# Patient Record
Sex: Female | Born: 1950 | Race: Black or African American | Hispanic: No | Marital: Married | Smoking: Never smoker
Health system: Southern US, Community
[De-identification: ages and names within clinical notes are randomized; demographics above are authoritative.]

## PROBLEM LIST (undated history)

## (undated) DIAGNOSIS — Z9889 Other specified postprocedural states: Secondary | ICD-10-CM

## (undated) DIAGNOSIS — M2550 Pain in unspecified joint: Secondary | ICD-10-CM

## (undated) DIAGNOSIS — N200 Calculus of kidney: Secondary | ICD-10-CM

## (undated) DIAGNOSIS — I10 Essential (primary) hypertension: Secondary | ICD-10-CM

## (undated) DIAGNOSIS — J189 Pneumonia, unspecified organism: Secondary | ICD-10-CM

## (undated) DIAGNOSIS — R112 Nausea with vomiting, unspecified: Secondary | ICD-10-CM

## (undated) DIAGNOSIS — M1990 Unspecified osteoarthritis, unspecified site: Secondary | ICD-10-CM

## (undated) DIAGNOSIS — E785 Hyperlipidemia, unspecified: Secondary | ICD-10-CM

## (undated) DIAGNOSIS — K760 Fatty (change of) liver, not elsewhere classified: Secondary | ICD-10-CM

## (undated) DIAGNOSIS — K219 Gastro-esophageal reflux disease without esophagitis: Secondary | ICD-10-CM

## (undated) DIAGNOSIS — Z8659 Personal history of other mental and behavioral disorders: Secondary | ICD-10-CM

## (undated) DIAGNOSIS — M549 Dorsalgia, unspecified: Secondary | ICD-10-CM

## (undated) DIAGNOSIS — E1140 Type 2 diabetes mellitus with diabetic neuropathy, unspecified: Secondary | ICD-10-CM

## (undated) DIAGNOSIS — K3184 Gastroparesis: Secondary | ICD-10-CM

## (undated) DIAGNOSIS — G909 Disorder of the autonomic nervous system, unspecified: Secondary | ICD-10-CM

## (undated) DIAGNOSIS — F32A Depression, unspecified: Secondary | ICD-10-CM

## (undated) DIAGNOSIS — F419 Anxiety disorder, unspecified: Secondary | ICD-10-CM

## (undated) DIAGNOSIS — Z91018 Allergy to other foods: Secondary | ICD-10-CM

## (undated) DIAGNOSIS — K900 Celiac disease: Secondary | ICD-10-CM

## (undated) DIAGNOSIS — H269 Unspecified cataract: Secondary | ICD-10-CM

## (undated) DIAGNOSIS — K589 Irritable bowel syndrome without diarrhea: Secondary | ICD-10-CM

## (undated) DIAGNOSIS — K5790 Diverticulosis of intestine, part unspecified, without perforation or abscess without bleeding: Secondary | ICD-10-CM

## (undated) DIAGNOSIS — F329 Major depressive disorder, single episode, unspecified: Secondary | ICD-10-CM

## (undated) DIAGNOSIS — K5900 Constipation, unspecified: Secondary | ICD-10-CM

## (undated) DIAGNOSIS — R0602 Shortness of breath: Secondary | ICD-10-CM

## (undated) DIAGNOSIS — Z86010 Personal history of colonic polyps: Secondary | ICD-10-CM

## (undated) HISTORY — DX: Gastroparesis: K31.84

## (undated) HISTORY — DX: Celiac disease: K90.0

## (undated) HISTORY — DX: Personal history of colonic polyps: Z86.010

## (undated) HISTORY — DX: Unspecified osteoarthritis, unspecified site: M19.90

## (undated) HISTORY — DX: Fatty (change of) liver, not elsewhere classified: K76.0

## (undated) HISTORY — DX: Diverticulosis of intestine, part unspecified, without perforation or abscess without bleeding: K57.90

## (undated) HISTORY — DX: Disorder of the autonomic nervous system, unspecified: G90.9

## (undated) HISTORY — DX: Irritable bowel syndrome, unspecified: K58.9

## (undated) HISTORY — DX: Essential (primary) hypertension: I10

## (undated) HISTORY — DX: Unspecified cataract: H26.9

## (undated) HISTORY — DX: Calculus of kidney: N20.0

## (undated) HISTORY — DX: Constipation, unspecified: K59.00

## (undated) HISTORY — PX: COLONOSCOPY: SHX174

## (undated) HISTORY — DX: Hyperlipidemia, unspecified: E78.5

## (undated) HISTORY — PX: TUBAL LIGATION: SHX77

## (undated) HISTORY — PX: APPENDECTOMY: SHX54

---
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
## 1998-05-01 HISTORY — PX: OTHER SURGICAL HISTORY: SHX169

## 1998-08-09 ENCOUNTER — Other Ambulatory Visit: Admission: RE | Admit: 1998-08-09 | Discharge: 1998-08-09 | Payer: Self-pay | Admitting: Obstetrics and Gynecology

## 1999-08-06 ENCOUNTER — Emergency Department (HOSPITAL_COMMUNITY): Admission: EM | Admit: 1999-08-06 | Discharge: 1999-08-06 | Payer: Self-pay | Admitting: Emergency Medicine

## 1999-09-16 ENCOUNTER — Other Ambulatory Visit: Admission: RE | Admit: 1999-09-16 | Discharge: 1999-09-16 | Payer: Self-pay | Admitting: Obstetrics and Gynecology

## 1999-10-24 ENCOUNTER — Other Ambulatory Visit: Admission: RE | Admit: 1999-10-24 | Discharge: 1999-10-24 | Payer: Self-pay | Admitting: Gastroenterology

## 2000-03-16 ENCOUNTER — Ambulatory Visit (HOSPITAL_BASED_OUTPATIENT_CLINIC_OR_DEPARTMENT_OTHER): Admission: RE | Admit: 2000-03-16 | Discharge: 2000-03-16 | Payer: Self-pay | Admitting: Pulmonary Disease

## 2000-10-01 ENCOUNTER — Other Ambulatory Visit: Admission: RE | Admit: 2000-10-01 | Discharge: 2000-10-01 | Payer: Self-pay | Admitting: Obstetrics and Gynecology

## 2001-11-25 ENCOUNTER — Other Ambulatory Visit: Admission: RE | Admit: 2001-11-25 | Discharge: 2001-11-25 | Payer: Self-pay | Admitting: Obstetrics and Gynecology

## 2001-12-13 ENCOUNTER — Encounter: Payer: Self-pay | Admitting: Gastroenterology

## 2001-12-13 ENCOUNTER — Ambulatory Visit (HOSPITAL_COMMUNITY): Admission: RE | Admit: 2001-12-13 | Discharge: 2001-12-13 | Payer: Self-pay | Admitting: Gastroenterology

## 2002-06-05 ENCOUNTER — Ambulatory Visit (HOSPITAL_COMMUNITY): Admission: RE | Admit: 2002-06-05 | Discharge: 2002-06-05 | Payer: Self-pay | Admitting: Internal Medicine

## 2002-06-05 ENCOUNTER — Encounter: Payer: Self-pay | Admitting: Internal Medicine

## 2003-01-19 ENCOUNTER — Other Ambulatory Visit: Admission: RE | Admit: 2003-01-19 | Discharge: 2003-01-19 | Payer: Self-pay | Admitting: Obstetrics and Gynecology

## 2003-12-26 ENCOUNTER — Emergency Department (HOSPITAL_COMMUNITY): Admission: EM | Admit: 2003-12-26 | Discharge: 2003-12-26 | Payer: Self-pay | Admitting: Emergency Medicine

## 2004-01-27 ENCOUNTER — Other Ambulatory Visit: Admission: RE | Admit: 2004-01-27 | Discharge: 2004-01-27 | Payer: Self-pay | Admitting: Obstetrics and Gynecology

## 2004-02-04 ENCOUNTER — Encounter: Admission: RE | Admit: 2004-02-04 | Discharge: 2004-05-04 | Payer: Self-pay | Admitting: Internal Medicine

## 2004-04-21 ENCOUNTER — Ambulatory Visit: Payer: Self-pay | Admitting: Internal Medicine

## 2004-05-10 ENCOUNTER — Ambulatory Visit: Payer: Self-pay | Admitting: Internal Medicine

## 2004-06-06 ENCOUNTER — Ambulatory Visit: Payer: Self-pay | Admitting: Internal Medicine

## 2004-06-10 ENCOUNTER — Ambulatory Visit: Payer: Self-pay | Admitting: Internal Medicine

## 2004-09-06 ENCOUNTER — Ambulatory Visit: Payer: Self-pay | Admitting: Internal Medicine

## 2004-09-09 ENCOUNTER — Ambulatory Visit: Payer: Self-pay | Admitting: Internal Medicine

## 2004-11-21 ENCOUNTER — Ambulatory Visit: Payer: Self-pay | Admitting: Gastroenterology

## 2004-11-30 ENCOUNTER — Ambulatory Visit: Payer: Self-pay | Admitting: Gastroenterology

## 2004-12-02 ENCOUNTER — Ambulatory Visit: Payer: Self-pay | Admitting: Internal Medicine

## 2004-12-30 ENCOUNTER — Emergency Department (HOSPITAL_COMMUNITY): Admission: EM | Admit: 2004-12-30 | Discharge: 2004-12-30 | Payer: Self-pay | Admitting: Emergency Medicine

## 2005-04-03 ENCOUNTER — Ambulatory Visit: Payer: Self-pay | Admitting: Internal Medicine

## 2005-04-07 ENCOUNTER — Encounter: Payer: Self-pay | Admitting: Internal Medicine

## 2005-04-07 ENCOUNTER — Ambulatory Visit: Payer: Self-pay

## 2005-04-14 ENCOUNTER — Ambulatory Visit: Payer: Self-pay

## 2005-04-25 ENCOUNTER — Ambulatory Visit: Payer: Self-pay | Admitting: Internal Medicine

## 2005-05-10 ENCOUNTER — Ambulatory Visit: Payer: Self-pay | Admitting: Internal Medicine

## 2005-06-29 ENCOUNTER — Ambulatory Visit: Payer: Self-pay | Admitting: Internal Medicine

## 2005-10-19 ENCOUNTER — Ambulatory Visit: Payer: Self-pay | Admitting: Internal Medicine

## 2005-10-20 ENCOUNTER — Ambulatory Visit (HOSPITAL_COMMUNITY): Admission: RE | Admit: 2005-10-20 | Discharge: 2005-10-20 | Payer: Self-pay | Admitting: Internal Medicine

## 2005-11-21 ENCOUNTER — Ambulatory Visit: Payer: Self-pay | Admitting: Internal Medicine

## 2005-12-21 ENCOUNTER — Ambulatory Visit: Payer: Self-pay | Admitting: Internal Medicine

## 2006-01-02 ENCOUNTER — Ambulatory Visit: Payer: Self-pay | Admitting: Endocrinology

## 2006-01-25 ENCOUNTER — Ambulatory Visit: Payer: Self-pay | Admitting: Endocrinology

## 2006-02-15 ENCOUNTER — Ambulatory Visit: Payer: Self-pay | Admitting: Endocrinology

## 2006-03-27 ENCOUNTER — Ambulatory Visit: Payer: Self-pay | Admitting: Internal Medicine

## 2006-04-25 ENCOUNTER — Ambulatory Visit: Payer: Self-pay | Admitting: Endocrinology

## 2006-05-12 ENCOUNTER — Encounter (INDEPENDENT_AMBULATORY_CARE_PROVIDER_SITE_OTHER): Payer: Self-pay | Admitting: *Deleted

## 2006-05-12 ENCOUNTER — Ambulatory Visit: Payer: Self-pay | Admitting: Family Medicine

## 2006-06-14 ENCOUNTER — Emergency Department (HOSPITAL_COMMUNITY): Admission: EM | Admit: 2006-06-14 | Discharge: 2006-06-15 | Payer: Self-pay | Admitting: Emergency Medicine

## 2006-06-19 ENCOUNTER — Ambulatory Visit: Payer: Self-pay | Admitting: Endocrinology

## 2006-07-25 ENCOUNTER — Ambulatory Visit: Payer: Self-pay | Admitting: Endocrinology

## 2006-08-01 ENCOUNTER — Ambulatory Visit: Payer: Self-pay

## 2006-08-28 ENCOUNTER — Ambulatory Visit: Payer: Self-pay | Admitting: Endocrinology

## 2006-08-28 LAB — CONVERTED CEMR LAB: Hgb A1c MFr Bld: 8.5 % — ABNORMAL HIGH (ref 4.6–6.0)

## 2006-08-31 ENCOUNTER — Ambulatory Visit: Payer: Self-pay | Admitting: Endocrinology

## 2006-09-10 ENCOUNTER — Ambulatory Visit: Payer: Self-pay | Admitting: Endocrinology

## 2006-10-22 ENCOUNTER — Encounter: Payer: Self-pay | Admitting: Endocrinology

## 2006-10-22 DIAGNOSIS — J45909 Unspecified asthma, uncomplicated: Secondary | ICD-10-CM | POA: Insufficient documentation

## 2006-10-22 DIAGNOSIS — K649 Unspecified hemorrhoids: Secondary | ICD-10-CM | POA: Insufficient documentation

## 2006-10-22 DIAGNOSIS — D509 Iron deficiency anemia, unspecified: Secondary | ICD-10-CM | POA: Insufficient documentation

## 2006-10-22 DIAGNOSIS — I10 Essential (primary) hypertension: Secondary | ICD-10-CM | POA: Insufficient documentation

## 2006-10-22 DIAGNOSIS — E1143 Type 2 diabetes mellitus with diabetic autonomic (poly)neuropathy: Secondary | ICD-10-CM | POA: Insufficient documentation

## 2006-10-22 DIAGNOSIS — I152 Hypertension secondary to endocrine disorders: Secondary | ICD-10-CM | POA: Insufficient documentation

## 2006-11-23 ENCOUNTER — Encounter: Payer: Self-pay | Admitting: Endocrinology

## 2006-11-23 ENCOUNTER — Ambulatory Visit: Payer: Self-pay | Admitting: Endocrinology

## 2006-11-23 ENCOUNTER — Other Ambulatory Visit: Admission: RE | Admit: 2006-11-23 | Discharge: 2006-11-23 | Payer: Self-pay | Admitting: Endocrinology

## 2006-11-23 LAB — CONVERTED CEMR LAB
ALT: 17 units/L (ref 0–35)
AST: 15 units/L (ref 0–37)
Albumin: 3.6 g/dL (ref 3.5–5.2)
Alkaline Phosphatase: 79 units/L (ref 39–117)
Bilirubin, Direct: 0.1 mg/dL (ref 0.0–0.3)
Cholesterol: 205 mg/dL (ref 0–200)
Direct LDL: 143.2 mg/dL
HDL: 40 mg/dL (ref >39.0)
Hgb A1c MFr Bld: 8.1 % — ABNORMAL HIGH (ref 4.6–6.0)
TSH: 1.24 microintl units/mL (ref 0.35–5.50)
Testosterone: 24.96 ng/dL (ref 10.00–70.0)
Total Bilirubin: 0.9 mg/dL (ref 0.3–1.2)
Total CHOL/HDL Ratio: 5.1
Total Protein: 7.1 g/dL (ref 6.0–8.3)
Triglycerides: 67 mg/dL (ref 0–149)
VLDL: 13 mg/dL (ref 0–40)

## 2006-11-26 ENCOUNTER — Encounter: Admission: RE | Admit: 2006-11-26 | Discharge: 2006-11-26 | Payer: Self-pay | Admitting: Endocrinology

## 2006-12-03 ENCOUNTER — Ambulatory Visit: Payer: Self-pay | Admitting: Endocrinology

## 2006-12-03 LAB — CONVERTED CEMR LAB
BUN: 14 mg/dL (ref 6–23)
Creatinine, Ser: 0.8 mg/dL (ref 0.4–1.2)

## 2006-12-07 ENCOUNTER — Encounter: Admission: RE | Admit: 2006-12-07 | Discharge: 2006-12-07 | Payer: Self-pay | Admitting: Endocrinology

## 2007-02-05 ENCOUNTER — Encounter: Payer: Self-pay | Admitting: Endocrinology

## 2007-02-14 ENCOUNTER — Ambulatory Visit: Payer: Self-pay | Admitting: Gastroenterology

## 2007-02-27 ENCOUNTER — Ambulatory Visit: Payer: Self-pay | Admitting: Gastroenterology

## 2007-02-27 DIAGNOSIS — E1143 Type 2 diabetes mellitus with diabetic autonomic (poly)neuropathy: Secondary | ICD-10-CM | POA: Insufficient documentation

## 2007-02-27 DIAGNOSIS — K3184 Gastroparesis: Secondary | ICD-10-CM

## 2007-02-27 DIAGNOSIS — K2990 Gastroduodenitis, unspecified, without bleeding: Secondary | ICD-10-CM

## 2007-02-27 DIAGNOSIS — K2970 Gastritis, unspecified, without bleeding: Secondary | ICD-10-CM | POA: Insufficient documentation

## 2007-02-27 DIAGNOSIS — T189XXA Foreign body of alimentary tract, part unspecified, initial encounter: Secondary | ICD-10-CM | POA: Insufficient documentation

## 2007-03-02 ENCOUNTER — Encounter: Payer: Self-pay | Admitting: *Deleted

## 2007-05-13 ENCOUNTER — Encounter: Payer: Self-pay | Admitting: Endocrinology

## 2007-06-21 ENCOUNTER — Ambulatory Visit: Payer: Self-pay | Admitting: Endocrinology

## 2007-06-21 DIAGNOSIS — J309 Allergic rhinitis, unspecified: Secondary | ICD-10-CM | POA: Insufficient documentation

## 2007-06-21 DIAGNOSIS — M79609 Pain in unspecified limb: Secondary | ICD-10-CM | POA: Insufficient documentation

## 2007-06-21 LAB — CONVERTED CEMR LAB: Hgb A1c MFr Bld: 8 % — ABNORMAL HIGH (ref 4.6–6.0)

## 2007-09-26 ENCOUNTER — Ambulatory Visit: Payer: Self-pay | Admitting: Endocrinology

## 2007-09-26 DIAGNOSIS — R220 Localized swelling, mass and lump, head: Secondary | ICD-10-CM | POA: Insufficient documentation

## 2007-09-26 DIAGNOSIS — R252 Cramp and spasm: Secondary | ICD-10-CM | POA: Insufficient documentation

## 2007-09-26 DIAGNOSIS — R221 Localized swelling, mass and lump, neck: Secondary | ICD-10-CM

## 2007-09-26 LAB — CONVERTED CEMR LAB: Blood Glucose, Fingerstick: 81

## 2007-10-03 ENCOUNTER — Encounter: Admission: RE | Admit: 2007-10-03 | Discharge: 2007-10-03 | Payer: Self-pay | Admitting: Endocrinology

## 2007-11-19 ENCOUNTER — Encounter: Payer: Self-pay | Admitting: Endocrinology

## 2007-12-26 ENCOUNTER — Ambulatory Visit: Payer: Self-pay | Admitting: Endocrinology

## 2007-12-26 DIAGNOSIS — L989 Disorder of the skin and subcutaneous tissue, unspecified: Secondary | ICD-10-CM | POA: Insufficient documentation

## 2007-12-26 LAB — CONVERTED CEMR LAB: Hgb A1c MFr Bld: 7.9 % — ABNORMAL HIGH (ref 4.6–6.0)

## 2008-01-02 ENCOUNTER — Encounter: Payer: Self-pay | Admitting: Endocrinology

## 2008-01-23 ENCOUNTER — Telehealth: Payer: Self-pay | Admitting: Endocrinology

## 2008-03-31 LAB — HM MAMMOGRAPHY: HM Mammogram: NORMAL

## 2008-04-01 ENCOUNTER — Ambulatory Visit: Payer: Self-pay | Admitting: Endocrinology

## 2008-04-01 DIAGNOSIS — Z780 Asymptomatic menopausal state: Secondary | ICD-10-CM | POA: Insufficient documentation

## 2008-04-01 DIAGNOSIS — D72819 Decreased white blood cell count, unspecified: Secondary | ICD-10-CM | POA: Insufficient documentation

## 2008-04-01 LAB — CONVERTED CEMR LAB
ALT: 18 units/L (ref 0–35)
AST: 21 units/L (ref 0–37)
Albumin: 3.6 g/dL (ref 3.5–5.2)
Alkaline Phosphatase: 60 units/L (ref 39–117)
BUN: 18 mg/dL (ref 6–23)
Basophils Absolute: 0 10*3/uL (ref 0.0–0.1)
Basophils Relative: 0.6 % (ref 0.0–3.0)
Bilirubin Urine: NEGATIVE
Bilirubin, Direct: 0.1 mg/dL (ref 0.0–0.3)
CO2: 29 meq/L (ref 19–32)
Calcium: 9.2 mg/dL (ref 8.4–10.5)
Chloride: 107 meq/L (ref 96–112)
Cholesterol: 193 mg/dL (ref 0–200)
Creatinine, Ser: 0.9 mg/dL (ref 0.4–1.2)
Creatinine,U: 189.1 mg/dL
Crystals: NEGATIVE
Eosinophils Absolute: 0.1 10*3/uL (ref 0.0–0.7)
Eosinophils Relative: 2.4 % (ref 0.0–5.0)
GFR calc Af Amer: 83 mL/min
GFR calc non Af Amer: 69 mL/min
Glucose, Bld: 179 mg/dL — ABNORMAL HIGH (ref 70–99)
HCT: 42.3 % (ref 36.0–46.0)
HDL: 44.6 mg/dL (ref >39.0)
Hemoglobin, Urine: NEGATIVE
Hemoglobin: 14.5 g/dL (ref 12.0–15.0)
Hgb A1c MFr Bld: 8 % — ABNORMAL HIGH (ref 4.6–6.0)
Iron: 97 ug/dL (ref 42–145)
LDL Cholesterol: 141 mg/dL — ABNORMAL HIGH (ref 0–99)
Leukocytes, UA: NEGATIVE
Lymphocytes Relative: 45.3 % (ref 12.0–46.0)
MCHC: 34.3 g/dL (ref 30.0–36.0)
MCV: 95.3 fL (ref 78.0–100.0)
Microalb Creat Ratio: 8.5 mg/g (ref 0.0–30.0)
Microalb, Ur: 1.6 mg/dL (ref 0.0–1.9)
Monocytes Absolute: 0.3 10*3/uL (ref 0.1–1.0)
Monocytes Relative: 7.4 % (ref 3.0–12.0)
Neutro Abs: 1.5 10*3/uL (ref 1.4–7.7)
Neutrophils Relative %: 44.3 % (ref 43.0–77.0)
Nitrite: NEGATIVE
Platelets: 234 10*3/uL (ref 150–400)
Potassium: 4.7 meq/L (ref 3.5–5.1)
RBC / HPF: NONE SEEN
RBC: 4.44 M/uL (ref 3.87–5.11)
RDW: 12.3 % (ref 11.5–14.6)
Saturation Ratios: 27.2 % (ref 20.0–50.0)
Sodium: 140 meq/L (ref 135–145)
Specific Gravity, Urine: >=1.03 (ref 1.000–1.03)
TSH: 1.1 microintl units/mL (ref 0.35–5.50)
Total Bilirubin: 0.8 mg/dL (ref 0.3–1.2)
Total CHOL/HDL Ratio: 4.3
Total Protein, Urine: NEGATIVE mg/dL
Total Protein: 6.9 g/dL (ref 6.0–8.3)
Transferrin: 255.1 mg/dL (ref >=212.0)
Triglycerides: 36 mg/dL (ref 0–149)
Urine Glucose: 250 mg/dL — AB
Urobilinogen, UA: 0.2 (ref 0.0–1.0)
VLDL: 7 mg/dL (ref 0–40)
WBC: 3.4 10*3/uL — ABNORMAL LOW (ref 4.5–10.5)
pH: 5.5 (ref 5.0–8.0)

## 2008-04-07 ENCOUNTER — Telehealth (INDEPENDENT_AMBULATORY_CARE_PROVIDER_SITE_OTHER): Payer: Self-pay | Admitting: *Deleted

## 2008-04-08 ENCOUNTER — Telehealth (INDEPENDENT_AMBULATORY_CARE_PROVIDER_SITE_OTHER): Payer: Self-pay | Admitting: *Deleted

## 2008-04-09 ENCOUNTER — Ambulatory Visit: Payer: Self-pay | Admitting: Internal Medicine

## 2008-04-09 ENCOUNTER — Encounter: Payer: Self-pay | Admitting: Endocrinology

## 2008-05-04 ENCOUNTER — Telehealth (INDEPENDENT_AMBULATORY_CARE_PROVIDER_SITE_OTHER): Payer: Self-pay | Admitting: *Deleted

## 2008-10-23 ENCOUNTER — Ambulatory Visit: Payer: Self-pay | Admitting: Endocrinology

## 2008-10-23 LAB — CONVERTED CEMR LAB: Hgb A1c MFr Bld: 8.4 % — ABNORMAL HIGH (ref 4.6–6.5)

## 2008-11-05 ENCOUNTER — Ambulatory Visit: Payer: Self-pay | Admitting: Gastroenterology

## 2008-11-12 ENCOUNTER — Ambulatory Visit: Payer: Self-pay | Admitting: Gastroenterology

## 2008-11-12 LAB — CONVERTED CEMR LAB: Fecal Occult Bld: NEGATIVE

## 2008-11-13 ENCOUNTER — Encounter: Payer: Self-pay | Admitting: Endocrinology

## 2009-01-14 ENCOUNTER — Ambulatory Visit: Payer: Self-pay | Admitting: Endocrinology

## 2009-01-14 DIAGNOSIS — R109 Unspecified abdominal pain: Secondary | ICD-10-CM | POA: Insufficient documentation

## 2009-01-14 DIAGNOSIS — R079 Chest pain, unspecified: Secondary | ICD-10-CM | POA: Insufficient documentation

## 2009-01-14 LAB — CONVERTED CEMR LAB
Creatinine,U: 250.2 mg/dL
Hgb A1c MFr Bld: 8.7 % — ABNORMAL HIGH (ref 4.6–6.5)
Microalb Creat Ratio: 3.6 mg/g (ref 0.0–30.0)
Microalb, Ur: 0.9 mg/dL (ref 0.0–1.9)

## 2009-01-15 ENCOUNTER — Telehealth: Payer: Self-pay | Admitting: Endocrinology

## 2009-01-15 LAB — CONVERTED CEMR LAB
ALT: 20 units/L (ref 0–35)
AST: 21 units/L (ref 0–37)
Albumin: 3.6 g/dL (ref 3.5–5.2)
Alkaline Phosphatase: 69 units/L (ref 39–117)
Amylase: 78 units/L (ref 27–131)
BUN: 19 mg/dL (ref 6–23)
Basophils Absolute: 0 10*3/uL (ref 0.0–0.1)
Basophils Relative: 0.9 % (ref 0.0–3.0)
Bilirubin, Direct: 0 mg/dL (ref 0.0–0.3)
CO2: 29 meq/L (ref 19–32)
Calcium: 8.9 mg/dL (ref 8.4–10.5)
Chloride: 104 meq/L (ref 96–112)
Creatinine, Ser: 0.9 mg/dL (ref 0.4–1.2)
Eosinophils Absolute: 0.1 10*3/uL (ref 0.0–0.7)
Eosinophils Relative: 3 % (ref 0.0–5.0)
GFR calc non Af Amer: 82.62 mL/min (ref >60)
Glucose, Bld: 212 mg/dL — ABNORMAL HIGH (ref 70–99)
HCT: 41.1 % (ref 36.0–46.0)
Hemoglobin: 14 g/dL (ref 12.0–15.0)
Lymphocytes Relative: 48.1 % — ABNORMAL HIGH (ref 12.0–46.0)
Lymphs Abs: 1.7 10*3/uL (ref 0.7–4.0)
MCHC: 34.1 g/dL (ref 30.0–36.0)
MCV: 95.2 fL (ref 78.0–100.0)
Monocytes Absolute: 0.3 10*3/uL (ref 0.1–1.0)
Monocytes Relative: 8 % (ref 3.0–12.0)
Neutro Abs: 1.4 10*3/uL (ref 1.4–7.7)
Neutrophils Relative %: 40 % — ABNORMAL LOW (ref 43.0–77.0)
Platelets: 226 10*3/uL (ref 150.0–400.0)
Potassium: 4 meq/L (ref 3.5–5.1)
RBC: 4.31 M/uL (ref 3.87–5.11)
RDW: 12.6 % (ref 11.5–14.6)
Sodium: 139 meq/L (ref 135–145)
Total Bilirubin: 0.8 mg/dL (ref 0.3–1.2)
Total Protein: 7.2 g/dL (ref 6.0–8.3)
WBC: 3.5 10*3/uL — ABNORMAL LOW (ref 4.5–10.5)

## 2009-01-22 ENCOUNTER — Encounter: Admission: RE | Admit: 2009-01-22 | Discharge: 2009-01-22 | Payer: Self-pay | Admitting: Endocrinology

## 2009-01-28 ENCOUNTER — Telehealth: Payer: Self-pay | Admitting: Endocrinology

## 2009-01-29 ENCOUNTER — Telehealth: Payer: Self-pay | Admitting: Endocrinology

## 2009-01-29 ENCOUNTER — Ambulatory Visit: Payer: Self-pay | Admitting: Endocrinology

## 2009-01-29 DIAGNOSIS — E7800 Pure hypercholesterolemia, unspecified: Secondary | ICD-10-CM

## 2009-01-29 DIAGNOSIS — E1169 Type 2 diabetes mellitus with other specified complication: Secondary | ICD-10-CM | POA: Insufficient documentation

## 2009-04-01 ENCOUNTER — Ambulatory Visit: Payer: Self-pay | Admitting: Endocrinology

## 2009-04-01 DIAGNOSIS — H60339 Swimmer's ear, unspecified ear: Secondary | ICD-10-CM | POA: Insufficient documentation

## 2009-04-01 LAB — CONVERTED CEMR LAB
Hgb A1c MFr Bld: 8.2 % — ABNORMAL HIGH (ref 4.6–6.5)
Vit D, 25-Hydroxy: 26 ng/mL — ABNORMAL LOW (ref 30–89)

## 2009-07-01 ENCOUNTER — Ambulatory Visit: Payer: Self-pay | Admitting: Endocrinology

## 2009-07-01 LAB — CONVERTED CEMR LAB: Hgb A1c MFr Bld: 8.4 % — ABNORMAL HIGH (ref 4.6–6.5)

## 2009-09-02 ENCOUNTER — Telehealth: Payer: Self-pay | Admitting: Endocrinology

## 2009-10-07 ENCOUNTER — Ambulatory Visit: Payer: Self-pay | Admitting: Endocrinology

## 2009-10-07 DIAGNOSIS — R209 Unspecified disturbances of skin sensation: Secondary | ICD-10-CM | POA: Insufficient documentation

## 2009-10-07 LAB — CONVERTED CEMR LAB
Folate: 12.5 ng/mL
Hgb A1c MFr Bld: 8 % — ABNORMAL HIGH (ref 4.6–6.5)
Vitamin B-12: 305 pg/mL (ref 211–911)

## 2009-10-11 ENCOUNTER — Telehealth: Payer: Self-pay | Admitting: Endocrinology

## 2009-10-27 ENCOUNTER — Telehealth: Payer: Self-pay | Admitting: Internal Medicine

## 2009-11-19 ENCOUNTER — Encounter (INDEPENDENT_AMBULATORY_CARE_PROVIDER_SITE_OTHER): Payer: Self-pay | Admitting: *Deleted

## 2009-11-25 ENCOUNTER — Encounter: Payer: Self-pay | Admitting: Endocrinology

## 2009-12-20 ENCOUNTER — Ambulatory Visit: Payer: Self-pay

## 2009-12-20 ENCOUNTER — Ambulatory Visit: Payer: Self-pay | Admitting: Endocrinology

## 2009-12-20 DIAGNOSIS — R609 Edema, unspecified: Secondary | ICD-10-CM | POA: Insufficient documentation

## 2009-12-20 DIAGNOSIS — S43006A Unspecified dislocation of unspecified shoulder joint, initial encounter: Secondary | ICD-10-CM | POA: Insufficient documentation

## 2010-01-07 ENCOUNTER — Encounter (INDEPENDENT_AMBULATORY_CARE_PROVIDER_SITE_OTHER): Payer: Self-pay | Admitting: *Deleted

## 2010-01-11 ENCOUNTER — Ambulatory Visit: Payer: Self-pay | Admitting: Gastroenterology

## 2010-03-01 DIAGNOSIS — Z860100 Personal history of colon polyps, unspecified: Secondary | ICD-10-CM

## 2010-03-01 HISTORY — DX: Personal history of colon polyps, unspecified: Z86.0100

## 2010-03-01 HISTORY — DX: Personal history of colonic polyps: Z86.010

## 2010-03-07 ENCOUNTER — Ambulatory Visit: Payer: Self-pay | Admitting: Gastroenterology

## 2010-03-07 LAB — HM COLONOSCOPY

## 2010-03-08 ENCOUNTER — Encounter: Payer: Self-pay | Admitting: Gastroenterology

## 2010-05-01 LAB — HM MAMMOGRAPHY: HM Mammogram: NORMAL

## 2010-05-26 ENCOUNTER — Other Ambulatory Visit: Payer: Self-pay | Admitting: Obstetrics and Gynecology

## 2010-05-26 LAB — HM PAP SMEAR: HM Pap smear: NORMAL

## 2010-05-28 ENCOUNTER — Ambulatory Visit
Admission: RE | Admit: 2010-05-28 | Discharge: 2010-05-28 | Payer: Self-pay | Source: Home / Self Care | Attending: Family Medicine | Admitting: Family Medicine

## 2010-05-28 DIAGNOSIS — J019 Acute sinusitis, unspecified: Secondary | ICD-10-CM | POA: Insufficient documentation

## 2010-05-31 NOTE — Progress Notes (Signed)
Summary: novolog pen  Phone Note Refill Request Message from:  Fax from Pharmacy on January 23, 2008 12:11 PM  Refills Requested: Medication #1:  NOVOLOG FLEXPEN 100 UNIT/ML SOLN Inject as directed   Last Refilled: 12/22/2007  Method Requested: Electronic Initial call taken by: Tomma Lightning,  January 23, 2008 12:12 PM      Prescriptions: NOVOLOG FLEXPEN 100 UNIT/ML SOLN (INSULIN ASPART) Inject as directed  #15 [place] x 6   Entered by:   Tomma Lightning   Authorized by:   [name] MD   Signed by:   Tomma Lightning on 01/23/2008   Method used:   [facility]        [facility]       [address]       [place], [place]  [address]       Ph: [phone] or [phone]       Fax: [phone]   RxID:   [id number]

## 2010-05-31 NOTE — Assessment & Plan Note (Signed)
Summary: 3 MTH FU  $50   STC--side pain per pt rs earlier dy stc   Vital Signs:  Patient profile:   60 year old female Height:      66 inches Weight:      247 pounds BMI:     40.01 O2 Sat:      97 % on Room air Temp:     97.1 degrees F oral Pulse rate:   67 / minute BP sitting:   110 / 72  (left arm) Cuff size:   large  Vitals Entered By: Charlynne Cousins CMA (January 14, 2009 8:09 AM)  O2 Flow:  Room air CC: Pt here with c/o sharp pain in her right side radiating to her arm and states it was followed by bad indigestion x 5 days. Pt states her CBGs are running high in the am running in the 200's per her records/ ab   CC:  Pt here with c/o sharp pain in her right side radiating to her arm and states it was followed by bad indigestion x 5 days. Pt states her CBGs are running high in the am running in the 200's per her records/ ab.  History of Present Illness: she brings a record of her cbg's which i have reviewed today. it is extrememy variable, with no trend throughout the day. 5 days of intermittent  moderate pain at the right flank, rad to right shoulder, and associated "indigestion."  it also involves the right side of the chest.    Current Medications (verified): 1)  Diovan 320 Mg  Tabs (Valsartan) .... Once Daily 2)  Bd U/f Short Pen Needle 31g X 8 Mm Misc (Insulin Pen Needle) .... Use Qid 3)  Gabapentin 300 Mg Caps (Gabapentin) .... Take 1 Capsule By Mouth Twice A Day 4)  Levemir Flexpen 100 Unit/ml Soln (Insulin Detemir) .... Inject 45 Unit Subcutaneously  Every Night 5)  Novolog Flexpen 100 Unit/ml Soln (Insulin Aspart) .... Three Times A Day (Qac) 23-13-28 Units 6)  Onetouch Ultra Test  Strp (Glucose Blood) .... 250.01, and Lancets Tid 7)  Miralax   Powd (Polyethylene Glycol 3350) .... One Scoop One To Two Times A Day As Needed  Allergies (verified): 1)  ! Nsaids  Past History:  Past Medical History: Last updated: 03/02/2007 ASTHMA, CHILDHOOD (ICD-493.00) HEMORRHOIDS  (ICD-455.6) AUTONOMIC NEUROPATHY, DIABETIC (ICD-250.60) HYPERTENSION (ICD-401.9) DIABETES MELLITUS, TYPE II (ICD-250.00) ANEMIA-IRON DEFICIENCY (ICD-280.9)    Review of Systems  The patient denies hypoglycemia and fever.         she also has nausea  Physical Exam  General:  morbidly obese.   Chest Wall:  nontender Lungs:  Clear to auscultation bilaterally. Normal respiratory effort.  Heart:  Regular rate and rhythm without murmurs or gallops noted. Normal S1,S2.   Abdomen:  abdomen is soft, nontender.  no hepatosplenomegaly.   not distended.  no hernia    Impression & Recommendations:  Problem # 1:  DIABETES MELLITUS, TYPE II (ICD-250.00) she may do better with a simpler regimen  Problem # 2:  CHEST PAIN UNSPECIFIED (YJE-563.14) uncertain etiology  Problem # 3:  ABDOMINAL PAIN (HFW-263.78) uncertain etiology  Other Orders: EKG w/ Interpretation (93000) T-D-Dimer Fibrin Derivatives Quantitive (58850-27741) Radiology Referral (Radiology) T-2 View CXR (71020TC) TLB-Amylase (82150-AMYL) TLB-Hepatic/Liver Function Pnl (80076-HEPATIC) TLB-CBC Platelet - w/Differential (85025-CBCD) TLB-BMP (Basic Metabolic Panel-BMET) (28786-VEHMCNO) TLB-A1C / Hgb A1C (Glycohemoglobin) (83036-A1C) TLB-Microalbumin/Creat Ratio, Urine (82043-MALB) Est. Patient Level IV (70962)  Patient Instructions: 1)  tests are being ordered for you  today.  a few days after the test(s), please call [phone] to hear your test results.  please consider twice a day insulin 2)  ultrasound, x ray, treadmill heart test, and blood tests today. 3)  (update: i left message on phone-tree:  rx as we discussed)

## 2010-05-31 NOTE — Assessment & Plan Note (Signed)
Summary: KNEE PROBLEM/JUST DOESNT FEEL GOOD/NML    Vital Signs:  Patient Profile:   60 Years Old Female Weight:      246.0 pounds Temp:     98.2 degrees F oral Pulse rate:   70 / minute BP sitting:   137 / 83  (right arm) Cuff size:   large  Vitals Entered By: Tomma Lightning (June 21, 2007 10:49 AM)                 Visit Type:  Follow-up Visit  Chief Complaint:  multiple problems.  History of Present Illness: la a list of many, many symptoms which include: 30d of nasal congestion and rhinorrhea she also has slight wheezing recently few months of left kee pain pt c/o nodule left index finger cbg's well-controlled except slightly elev (high-100's) in am     Current Allergies: No known allergies   Past Medical History:    Reviewed history from 03/02/2007 and no changes required:       ASTHMA, CHILDHOOD (ICD-493.00)       HEMORRHOIDS (ICD-455.6)       AUTONOMIC NEUROPATHY, DIABETIC (ICD-250.60)       HYPERTENSION (ICD-401.9)       DIABETES MELLITUS, TYPE II (ICD-250.00)       ANEMIA-IRON DEFICIENCY (ICD-280.9)             Review of Systems  The patient denies prolonged cough.         denies sore throat,   Physical Exam  General:     obese.   Ears:     TMs intact and clear with normal canals and hearing Mouth:     no lesion of the oral mucosa.  no erythema.  mucous membranes are moist  Lungs:     clear to auscultation no wheezes noted Extremities:     <1 cm ganglion cyst is present left index finger Additional Exam:     a1c=8.0    Impression & Recommendations:  Problem # 1:  LEG PAIN, BILATERAL (ICD-729.5) knee pain Orders: Orthopedic Surgeon Referral (Ortho Surgeon)   Problem # 2:  ASTHMA, CHILDHOOD (ICD-493.00)  Orders: Est. Patient Level IV (45809)   Problem # 3:  DIABETES MELLITUS, TYPE II (ICD-250.00)  Her updated medication list for this problem includes:    Levemir 100 Unit/ml Soln (Insulin detemir)    Novolog 100  Unit/ml Soln (Insulin aspart)    Actos 45 Mg Tabs (Pioglitazone hcl)    Glipizide 10 Mg Tb24 (Glipizide)    Levemir Flexpen 100 Unit/ml Soln (Insulin detemir) ..... Inject 75 unit subcutaneously  every night    Metformin Hcl 1000 Mg Tabs (Metformin hcl)    Novolog Flexpen 100 Unit/ml Soln (Insulin aspart) ..... Inject as directed  Orders: TLB-A1C / Hgb A1C (Glycohemoglobin) (83036-A1C)   Problem # 4:  ganglion cyst  Problem # 5:  ALLERGIC RHINITIS CAUSE UNSPECIFIED (ICD-477.9)   Patient Instructions: 1)  change clatirin to claritin-d 2)  add nasacort aq 2 puffs each side qd 3)  add advair 100/50 1 puff two times a day 4)  ref ortho 5)  skin tags removed from neck 6)  i told pt no rx needed for ganglion cyst 7)  ret 30d with cbg record    ]

## 2010-05-31 NOTE — Letter (Signed)
Summary: Diabetic Instructions  [facility]  [place], [place] [address]   Phone: [phone]  Fax: [phone]    Jessica [name] 06-11-1950 MRN: [id number]     ________________________________________________________________________     _  _   INSULIN (SHORT ACTING) MEDICATION INSTRUCTIONS (Regular, Humulog, Novolog)   The day before your procedure:   Do not take your evening dose   The day of your procedure:   Do not take your morning dose

## 2010-05-31 NOTE — Consult Note (Signed)
Summary: seborrheic keratosis,dermatosis Papulosa Nigra/[facility]  seborrheic keratosis,dermatosis Papulosa Nigra/[facility]   Imported By: Bubba Hales 01/16/2008 11:51:06  _____________________________________________________________________  External Attachment:    Type:   Image     Comment:   External Document

## 2010-05-31 NOTE — Progress Notes (Signed)
  Phone Note Call from Patient   Caller: Patient Summary of Call: pt left message on triage stating that she is out of insulin and that pharmacy will not refill rx because it is too soon-pt is taking 110U daily and is requesting extra rx to last until next refill can be filled--please advise Initial call taken by: Rebeca Alert MA,  October 27, 2009 2:19 PM  Follow-up for Phone Call        ok to refill 75/25 now -   Follow-up by: Rowe Clack MD,  October 27, 2009 3:12 PM  Additional Follow-up for Phone Call Additional follow up Details #1::        left message w/ spouse per pt that rx was sent in  Additional Follow-up by: Rebeca Alert MA,  October 27, 2009 4:43 PM    New/Updated Medications: HUMALOG MIX 75/25 75-25 % SUSP (INSULIN LISPRO PROT & LISPRO) Take 65 U in morning and 45 U at night Prescriptions: HUMALOG MIX 75/25 75-25 % SUSP (INSULIN LISPRO PROT & LISPRO) Take 65 U in morning and 45 U at night  #1 month x 0   Entered by:   Rebeca Alert MA   Authorized by:   [name] MD   Signed by:   Rebeca Alert MA on 10/27/2009   Method used:   [facility]        [id number]       [address]       [place], [place]  [address]       Ph: [phone] or [phone]       Fax: [phone]   RxID:   [id number]

## 2010-05-31 NOTE — Letter (Signed)
Summary: [facility]   Imported By: Phillis Knack 12/01/2008 10:02:18  _____________________________________________________________________  External Attachment:    Type:   Image     Comment:   External Document

## 2010-05-31 NOTE — Miscellaneous (Signed)
Summary: Orders Update   Clinical Lists Changes  Orders: Added new Service order of [place] NS50 (NS50) - Signed

## 2010-05-31 NOTE — Miscellaneous (Signed)
Summary: [facility]  Clinical Lists Changes  Medications: Added new medication of MOVIPREP 100 GM  SOLR (PEG-KCL-NACL-NASULF-NA ASC-C) As per prep instructions. - Signed Rx of MOVIPREP 100 GM  SOLR (PEG-KCL-NACL-NASULF-NA ASC-C) As per prep instructions.;  #1 x 0;  Signed;  Entered by: Ulice Dash RN;  Authorized by: Sable Feil MD [facility];  Method used: [facility] [address] [address], [place], [place]  [address], Ph: [phone] or [phone], Fax: [phone] Observations: Added new observation of ALLERGY REV: Done (01/11/2010 10:30)    Prescriptions: MOVIPREP 100 GM  SOLR (PEG-KCL-NACL-NASULF-NA ASC-C) As per prep instructions.  #1 x 0   Entered by:   Ulice Dash RN   Authorized by:   Sable Feil MD [facility]   Signed by:   Ulice Dash RN on 01/11/2010   Method used:   [facility]        [facility] [address]       [address]       [place], [place]  [address]       Ph: [phone] or [phone]       Fax: [phone]   RxID:   [id number]

## 2010-05-31 NOTE — Procedures (Signed)
Summary: [facility]  Gastroenterology EGD   Imported By: Barb Merino RN 07/08/2007 07:55:36  _____________________________________________________________________  External Attachment:    Type:   Image     Comment:   External Document  Appended Document: Gastroenterology EGD ZZ-H Pylori, POC-Endoscopy - STATUS: Final             By: Salome Arnt               Perform Date: 29Oct08 13:51  Ordered By: Eliot Ford Date:                                       Last Updated Date: 29Oct08 13:51  Facility: [facility]                               Department: [facility]  Accession #:                                       USN:       [id number]  Findings  Result Name                              Result     Abnl   Normal Range     Units      Perf. Loc.  H Pylori, POC-Endoscopy                  NEG

## 2010-05-31 NOTE — Consult Note (Signed)
Summary: [facility]/Dr. Stephannie Li  [facility]/Dr. Stephannie Li   Imported By: Jerrye Noble D'jimraou 05/21/2007 13:03:04  _____________________________________________________________________  External Attachment:    Type:   Image     Comment:   External Document

## 2010-05-31 NOTE — Assessment & Plan Note (Signed)
Summary: 3 mth physical---$50---stc   Vital Signs:  Patient Profile:   60 Years Old Female Weight:      242.6 pounds O2 Sat:      98 % O2 treatment:    Room Air Temp:     97.5 degrees F oral Pulse rate:   72 / minute BP sitting:   134 / 72  (left arm) Cuff size:   large  Pt. in pain?   no  Vitals Entered By: Tomma Lightning (April 01, 2008 10:20 AM)                  Chief Complaint:  CPX/ PT CONCERN ABOUT BRUISE ON (L) LEG.  History of Present Illness: non-smoker.  no alcohol.  no previous dexa.    Prior Medications Reviewed Using: Patient Recall  Updated Prior Medication List: LEVEMIR 100 UNIT/ML  SOLN (INSULIN DETEMIR)  DIOVAN 320 MG  TABS (VALSARTAN) once daily ZOCOR 80 MG  TABS (SIMVASTATIN) once daily NEURONTIN 300 MG  CAPS (GABAPENTIN) two times a day NOVOLOG 100 UNIT/ML  SOLN (INSULIN ASPART) qac (three times a day) 18-8-20 units METOCLOPRAMIDE HCL 5 MG  TABS (METOCLOPRAMIDE HCL) once daily LASIX 40 MG  TABS (FUROSEMIDE) as needed ACTOS 45 MG TABS (PIOGLITAZONE HCL)  AMITRIPTYLINE HCL 25 MG TABS (AMITRIPTYLINE HCL)  BD U/F SHORT PEN NEEDLE 31G X 8 MM MISC (INSULIN PEN NEEDLE)  [BMN] GABAPENTIN 300 MG CAPS (GABAPENTIN) Take 1 capsule by mouth twice a day GLIPIZIDE 10 MG TB24 (GLIPIZIDE)  LEVEMIR FLEXPEN 100 UNIT/ML SOLN (INSULIN DETEMIR) Inject 60 unit subcutaneously  every night METFORMIN HCL 1000 MG TABS (METFORMIN HCL)  NOVOLOG FLEXPEN 100 UNIT/ML SOLN (INSULIN ASPART) Inject as directed ONETOUCH ULTRA TEST  STRP (GLUCOSE BLOOD)  MIRALAX   POWD (POLYETHYLENE GLYCOL 3350) one scoop one to two times a day as needed  Current Allergies: No known allergies    Family History:    Reviewed history and no changes required:       sister has colon cancer  Social History:    Reviewed history and no changes required:       works for school system       married   Risk Factors:  Mammogram History:     Date of Last Mammogram:  03/31/2008    Results:   normal    Review of Systems  The patient denies fever, weight loss, weight gain, vision loss, decreased hearing, syncope, dyspnea on exertion, prolonged cough, headaches, abdominal pain, melena, hematochezia, severe indigestion/heartburn, hematuria, suspicious skin lesions, and depression.     Physical Exam  General:     obese.   Head:     head is normocephalic eyes: no scleral icterus no periorbital swelling perrl external ears are normal nose normal externally mouth has no lesion, including normal tongue  Neck:     no masses, thyromegaly, or abnormal cervical nodes Breasts:     sees gyn  Lungs:     clear to auscultation.  no respiratory distress  Heart:     regular rate and rhythm, S1, S2 without murmurs, rubs, gallops, or clicks Abdomen:     abdomen is soft, nontender.  no hepatosplenomegaly.   not distended.  no hernia  Rectal:     sees gyn  Genitalia:     sees gyn  Msk:     no deformity or scoliosis noted with normal posture and gait Neurologic:     cn 2-12 grossly intact.   readily moves all  4's.   sensation is intact to touch on the feet  Skin:     normal texture and temp.  no rash.  not diaphoretic  Cervical Nodes:     no significant adenopathy Psych:     alert and cooperative; normal mood and affect; normal attention span and concentration Additional Exam:     SEPARATE EVALUATION FOLLOWS--EACH PROBLEM HERE IS NEW, NOT RESPONDING TO TREATMENT, OR POSES SIGNIFICANT RISK TO THE PATIENT'S HEALTH: HISTORY OF THE PRESENT ILLNESS: pt states bruise right leg--may have had minor injury pt states few days of nasal congest she forgot her cbg record, but says well-controlled.  says it is generally higher later in the day [place] reviewed and up to date today REVIEW OF SYSTEMS: denies chest pain 1 episode hypoglycemia--does not recall time of day PHYSICAL EXAMINATION: no deformity.  no ulcer on the feet.  feet are of normal color and temp.   no edema dorsalis pedis intact bilat.  no carotid bruit LAB/XRAY RESULTS: a1c=8.0 ldl=141 IMPRESSION: needs adjustments of insulin ecchymosis dyslipidemia allergic rhinitis PLAN: reduce levemir to 50 units at night increase novolog to (just before each meal) 23-13-23 cbc call if new bruises or spreading of this one claritin-d please check to see if you are on zocor, and call back    Impression & Recommendations:  Problem # 1:  ROUTINE GENERAL MEDICAL EXAM@HEALTH  CARE FACL (ICD-V70.0)  Medications Added to Medication List This Visit: 1)  Onetouch Ultra Test Strp (Glucose blood) .... 250.01, and lancets tid  Other Orders: EKG w/ Interpretation (93000) T-Bone Densitometry (51884) TLB-TSH (Thyroid Stimulating Hormone) (84443-TSH) TLB-CBC Platelet - w/Differential (85025-CBCD) TLB-BMP (Basic Metabolic Panel-BMET) (16606-TKZSWFU) TLB-Lipid Panel (80061-LIPID) TLB-A1C / Hgb A1C (Glycohemoglobin) (83036-A1C) TLB-Microalbumin/Creat Ratio, Urine (82043-MALB) TLB-Udip w/ Micro (81001-URINE) TLB-IBC Pnl (Iron/FE;Transferrin) (83550-IBC) Est. Patient Level IV (93235)   Patient Instructions: 1)  we discussed the recommendations of the preventive services task force   Prescriptions: ONETOUCH ULTRA TEST  STRP (GLUCOSE BLOOD) 250.01, and lancets tid  #100 x 11   Entered and Authorized by:   [name] MD   Signed by:   [name] MD on 04/01/2008   Method used:   [facility]        [id number]       [address], [place]  [address]       Ph: [phone] or [phone]       Fax: [phone]   RxID:   [id number]  ]  Preventive Care Screening  Mammogram:    Date:  03/31/2008    Results:  normal   Last Tetanus Booster:    Date:  12/31/2002    Results:  given      gyn is dr Ouida Sills, who does mammography

## 2010-05-31 NOTE — Progress Notes (Signed)
Summary: rx  Phone Note Call from Patient Call back at Home Phone [phone]   Caller: [phone] Call For: Cassandria Anger MD Summary of Call: per [name] Neidert call wanted to inform Dr ellsion    that she is not taking a cholestrol medication , and she would like to be on the medication  [facility]  [facility] [address] [facility] [address] [place], [place]  [address] Ph: [phone] or [phone] Fax: [phone] Initial call taken by: [name],  April 07, 2008 9:31 AM  Follow-up for Phone Call        i sent Follow-up by: [name] MD,  April 07, 2008 9:46 AM  Additional Follow-up for Phone Call Additional follow up Details #1::        called pt to inform pt made aware Additional Follow-up by: [name],  April 07, 2008 10:45 AM      Prescriptions: ZOCOR 80 MG  TABS (SIMVASTATIN) once daily  #30 x 11   Entered and Authorized by:   [name] MD   Signed by:   [name] MD on 04/07/2008   Method used:   [facility]        [facility]  [place] [address]       [address]       [place], [place]  [address]       Ph: [phone] or [phone]       Fax: [phone]   RxID:   [id number] ZOCOR 80 MG  TABS (SIMVASTATIN) once daily  #30 x 11   Entered and Authorized by:   [name] MD   Signed by:   [name] MD on 04/07/2008   Method used:   Print then Give to Patient   RxID:   [id number]

## 2010-05-31 NOTE — Letter (Signed)
Summary: [facility]   Imported By: Rise Patience 12/02/2009 09:38:12  _____________________________________________________________________  External Attachment:    Type:   Image     Comment:   External Document

## 2010-05-31 NOTE — Assessment & Plan Note (Signed)
Summary: THYROID LUMP HURTING--$50-STC   Vital Signs:  Patient Profile:   60 Years Old Female Weight:      249.2 pounds Temp:     97.8 degrees F oral Pulse rate:   69 / minute BP sitting:   139 / 69  (right arm) Cuff size:   large  Vitals Entered By: Tomma Lightning (Sep 26, 2007 1:08 PM)             CBG Result 81     Chief Complaint:  thyroid lump hurting.  History of Present Illness: multiple sxs, as usual: pt says nodule at anterior neck is painful x 7 days few days ago 2 episodes severe cramp right hand pt says severe fatigue recently. many other sxs also. pt cbs low-100's in qm, then higher as the day goes on.  no cbg record    Current Allergies: No known allergies   Past Medical History:    Reviewed history from 03/02/2007 and no changes required:       ASTHMA, CHILDHOOD (ICD-493.00)       HEMORRHOIDS (ICD-455.6)       AUTONOMIC NEUROPATHY, DIABETIC (ICD-250.60)       HYPERTENSION (ICD-401.9)       DIABETES MELLITUS, TYPE II (ICD-250.00)       ANEMIA-IRON DEFICIENCY (ICD-280.9)             Review of Systems       denies hypoglycemia and dysphagia   Physical Exam  General:     obese.   Neck:     no masses, thyromegaly, or abnormal cervical nodes Msk:     right hand and wrist is completely normal to my exam    Impression & Recommendations:  Problem # 1:  NECK MASS (ICD-784.2)  Orders: Radiology Referral (Radiology)   Problem # 2:  CRAMP IN LIMB (ICD-729.82)  Problem # 3:  DIABETES MELLITUS, TYPE II (ICD-250.00) therapy limited by lack of cbg info Her updated medication list for this problem includes:    Levemir 100 Unit/ml Soln (Insulin detemir)    Novolog 100 Unit/ml Soln (Insulin aspart) ..... Qac (three times a day) 18-5 20 units    Actos 45 Mg Tabs (Pioglitazone hcl)    Glipizide 10 Mg Tb24 (Glipizide)    Levemir Flexpen 100 Unit/ml Soln (Insulin detemir) ..... Inject 75 unit subcutaneously  every night    Metformin Hcl 1000 Mg  Tabs (Metformin hcl)    Novolog Flexpen 100 Unit/ml Soln (Insulin aspart) ..... Inject as directed  Orders: Est. Patient Level IV (28315)   Problem # 4:  fatigue  Medications Added to Medication List This Visit: 1)  Novolog 100 Unit/ml Soln (Insulin aspart) .... Qac (three times a day) 18-5 20 units   Patient Instructions: 1)  reduce levemir to 65 units at bedtime 2)  same novolog 3)  repeat thyroid US 4)  no rx needed for rue sxs 5)  ret 30d with cbg record   ] Laboratory Results   Blood Tests     CBG Random:: 29m/dL    Check BS pt states she felt shakey

## 2010-05-31 NOTE — Assessment & Plan Note (Signed)
Summary: 3 MTH FU--STC   Vital Signs:  Patient profile:   60 year old female Height:      65 inches Weight:      247.25 pounds BMI:     41.29 O2 Sat:      98 % on Room air Temp:     98.2 degrees F oral Pulse rate:   63 / minute BP sitting:   118 / 74  (left arm) Cuff size:   regular  Vitals Entered By: Crissie Sickles, CMA (October 07, 2009 11:43 AM)  O2 Flow:  Room air CC: 3 mth F/U - Pt no longer taking Azithromycin   CC:  3 mth F/U - Pt no longer taking Azithromycin.  History of Present Illness: pt states 2 weeks of intermittent moderate numbness of the arms.  it is worse on the right arm than the left.  there is sometimes associated numbness of the legs.   no cbg record, but states cbg's are well-controlled. pt also recently has intermittent non-exertional chest pain lasting a few seconds.  Current Medications (verified): 1)  Diovan 320 Mg  Tabs (Valsartan) .... Once Daily 2)  Bd U/f Short Pen Needle 31g X 8 Mm Misc (Insulin Pen Needle) .... Use Qid 3)  Gabapentin 300 Mg Caps (Gabapentin) .... Take 1 Capsule By Mouth Twice A Day 4)  Onetouch Ultra Test  Strp (Glucose Blood) .... 250.01, and Lancets Tid 5)  Miralax   Powd (Polyethylene Glycol 3350) .... One Scoop One To Two Times A Day As Needed 6)  Clorostrol .Marland Kitchen.. 1 At Night 7)  Advil .... As Needed For Back and Knee Pain 8)  Pravastatin Sodium 80 Mg Tabs (Pravastatin Sodium) .Marland Kitchen.. 1 Qd 9)  Azithromycin 500 Mg Tabs (Azithromycin) .Marland Kitchen.. 1 Once Daily 10)  Humalog Mix 75/25 Pen 75-25 % Susp (Insulin Lispro Prot & Lispro) .... 55 Units Two Times A Day, and Pen Needles Two Times A Day  Allergies (verified): 1)  ! Nsaids  Past History:  Past Medical History: Last updated: 03/02/2007 ASTHMA, CHILDHOOD (ICD-493.00) HEMORRHOIDS (ICD-455.6) AUTONOMIC NEUROPATHY, DIABETIC (ICD-250.60) HYPERTENSION (ICD-401.9) DIABETES MELLITUS, TYPE II (ICD-250.00) ANEMIA-IRON DEFICIENCY (ICD-280.9)    Review of Systems       she  will have hypoglycemia in the middle of the night, unless she has hs-snack.   no arm pain  Physical Exam  General:  obese.  no distress  Chest Wall:  nontender Lungs:  Clear to auscultation bilaterally. Normal respiratory effort.  Heart:  Regular rate and rhythm without murmurs or gallops noted. Normal S1,S2.   Msk:  strength is normal on the hands Extremities:  no deformity of the arms of hands Neurologic:  sensation is intact to touch on the arms and hands Additional Exam:  Hemoglobin A1C       [H]  8.0 %       Impression & Recommendations:  Problem # 1:  DIABETES MELLITUS, TYPE II (ICD-250.00) needs increased rx  Problem # 2:  NUMBNESS (ICD-782.0) possibly due to #1  Problem # 3:  CHEST PAIN UNSPECIFIED (EXB-284.13) uncertain etiology  Medications Added to Medication List This Visit: 1)  Humalog Mix 75/25 Pen 75-25 % Susp (Insulin lispro prot & lispro) .... 65 units with breakfast, and 45 units with evening meal, and pen needles two times a day  Other Orders: EKG w/ Interpretation (93000) TLB-A1C / Hgb A1C (Glycohemoglobin) (83036-A1C) TLB-B12 + Folate Pnl ([id number]/YQI) Est. Patient Level IV (34742)  Patient Instructions: 1)  Please schedule  a physical appointment in 3 months. 2)  blood tests are being ordered for you today.  please call [phone] to hear your test results. 3)  pending the test results, please continue the same medications for now 4)  check your blood sugar 2 times a day.  vary the time of day when you check, between before the 3 meals, and at bedtime.  also check if you have symptoms of your blood sugar being too high or too low.  please keep a record of the readings and bring it to your next appointment here.  please call us sooner if you are having low blood sugar episodes. 5)  please let me know if you wish to have your nerve endings checked at a neurologist's office. 6)  (update: i left message on phone-tree:  change humalog 75/25 to 65 units  am and 45 units pm).

## 2010-05-31 NOTE — Assessment & Plan Note (Signed)
Summary: PER PT 3 MTH FU   $50   STC PRESSED 1 TO CX/ LMOM TO CONFIRM/NWS   Vital Signs:  Patient profile:   60 year old female Height:      66 inches Weight:      243 pounds BMI:     39.36 Temp:     98.1 degrees F oral Pulse rate:   64 / minute BP sitting:   124 / 80  (left arm) Cuff size:   large  Vitals Entered By: Charlynne Cousins CMA (October 23, 2008 11:34 AM) CC: pt here for 3 month fu/ ab   CC:  pt here for 3 month fu/ ab.  History of Present Illness: she feels well in general.  she brings a scant record of her cbg's which i have reviewed today.  almost all are checked in am.  cbg's are highest at hs (high-100's), and  lower (< 100) at all other times.  Current Medications (verified): 1)  Diovan 320 Mg  Tabs (Valsartan) .... Once Daily 2)  Metoclopramide Hcl 5 Mg  Tabs (Metoclopramide Hcl) .... Once Daily 3)  Bd U/f Short Pen Needle 31g X 8 Mm Misc (Insulin Pen Needle) .... Use Qid 4)  Gabapentin 300 Mg Caps (Gabapentin) .... Take 1 Capsule By Mouth Twice A Day 5)  Glipizide 10 Mg Tb24 (Glipizide) 6)  Levemir Flexpen 100 Unit/ml Soln (Insulin Detemir) .... Inject 60 Unit Subcutaneously  Every Night 7)  Novolog Flexpen 100 Unit/ml Soln (Insulin Aspart) .... Inject As Directed 8)  Onetouch Ultra Test  Strp (Glucose Blood) .... 250.01, and Lancets Tid 9)  Miralax   Powd (Polyethylene Glycol 3350) .... One Scoop One To Two Times A Day As Needed  Allergies (verified): No Known Drug Allergies  Past History:  Past Medical History: Last updated: 03/02/2007 ASTHMA, CHILDHOOD (ICD-493.00) HEMORRHOIDS (ICD-455.6) AUTONOMIC NEUROPATHY, DIABETIC (ICD-250.60) HYPERTENSION (ICD-401.9) DIABETES MELLITUS, TYPE II (ICD-250.00) ANEMIA-IRON DEFICIENCY (ICD-280.9)    Review of Systems  The patient denies hypoglycemia.    Physical Exam  General:  obese.  no distress  Neck:  Supple without thyroid enlargement or tenderness. No cervical lymphadenopathy, neck masses or tracheal  deviation.  Skin:  insulin injection sites at anterior abdomen are normal  Additional Exam:   Hemoglobin A1C       [H]  8.4 %     Impression & Recommendations:  Problem # 1:  DIABETES MELLITUS, TYPE II (ICD-250.00) needs increased rx  Medications Added to Medication List This Visit: 1)  Levemir Flexpen 100 Unit/ml Soln (Insulin detemir) .... Inject 45 unit subcutaneously  every night 2)  Novolog Flexpen 100 Unit/ml Soln (Insulin aspart) .... Three times a day (qac) 23-13-28 units  Other Orders: TLB-A1C / Hgb A1C (Glycohemoglobin) (83036-A1C) Est. Patient Level III (37902)  Patient Instructions: 1)  reduce levemir to 45 units at night 2)  increase novolog to (just before each meal) 23-13-28 units 3)  tests are being ordered for you today.  a few days after the test(s), please call [phone] to hear your test results.  this is very important to do because the results may change the instructions you see here 4)  (update: i left message on phone-tree:  rx as we discussed) 5)  Please schedule a follow-up appointment in 3 months. 6)  check your blood sugar 2 times a day.  vary the time of day when you check, between before the 3 meals, and at bedtime.  also check if you have symptoms of your  blood sugar being too high or too low.  please keep a record of the readings and bring it to your next appointment here.  please call us sooner if you are having low blood sugar episodes.

## 2010-05-31 NOTE — Progress Notes (Signed)
Summary: OV?  Phone Note Call from Patient Call back at Home Phone [phone]   Caller: Patient Summary of Call: pt called stating that she has a possible spider bite on her breast. Area is red, warm, swollen and itchy. Pt says area is not painful. Pt wants to know if she should have OV or wait and watch area. If OV, should pt see GYN or SAE? please advise Initial call taken by: Crissie Sickles, [place],  Sep 02, 2009 10:11 AM  Follow-up for Phone Call        if in your opinion it is mild, you can just put non-prescription hydrocortisone cream.  if more than mild, please have ov Follow-up by: [name] MD,  Sep 02, 2009 10:20 AM  Additional Follow-up for Phone Call Additional follow up Details #1::        left message on machine for pt to return my call  Crissie Sickles, CMA  Sep 02, 2009 2:55 PM   left mess to call office back...........Marland KitchenCharlsie Quest, CMA  Sep 02, 2009 5:56 PM     Additional Follow-up for Phone Call Additional follow up Details #2::    pt states that she spoke with GYN office that told pt to watch fo "lines running from site". pt states she did have several lines 2 days ago but only has 1-2 now. Pt is concerned about this sxs and is requesting SAE advisement. Cell [phone] Follow-up by: Crissie Sickles, CMA,  Sep 03, 2009 8:44 AM  Additional Follow-up for Phone Call Additional follow up Details #3:: Details for Additional Follow-up Action Taken: please advise ov today Additional Follow-up by: [name] MD,  Sep 03, 2009 12:54 PM  pt advised via VM to call back and schedule eval of possible spider bite Crissie Sickles, CMA  Sep 03, 2009 1:56 PM

## 2010-05-31 NOTE — Assessment & Plan Note (Signed)
Summary: FU/$50/PN   Vital Signs:  Patient Profile:   60 Years Old Female Weight:      246.0 pounds Temp:     97.7 degrees F oral Pulse rate:   65 / minute BP sitting:   132 / 70  (left arm) Cuff size:   large  Pt. in pain?   no  Vitals Entered By: Tomma Lightning (December 26, 2007 11:06 AM)                  Chief Complaint:  follow-up on dm.  History of Present Illness: takes qac novolog 18-5-20, and levemir 65 units at bedtime.  has occasional hypoglycemia between breakfast and lunch.  it is highest in the afternoon (200).  she brings a record of her cbg's checked at various times of day. i see no pattern to the cbg's. (70-200).  pt states few years of skin nodule on the face.  has no associated itching, but has slight pain    Current Allergies: No known allergies   Past Medical History:    Reviewed history from 03/02/2007 and no changes required:       ASTHMA, CHILDHOOD (ICD-493.00)       HEMORRHOIDS (ICD-455.6)       AUTONOMIC NEUROPATHY, DIABETIC (ICD-250.60)       HYPERTENSION (ICD-401.9)       DIABETES MELLITUS, TYPE II (ICD-250.00)       ANEMIA-IRON DEFICIENCY (ICD-280.9)             Review of Systems  The patient denies syncope, weight loss, and weight gain.     Physical Exam  General:     well developed, well nourished, in no acute distress Skin:     3 mm skin nodule right mandibular pain Additional Exam:     A1C%                 [H]  7.9 %      Impression & Recommendations:  Problem # 1:  DIABETES MELLITUS, TYPE II (ICD-250.00)  Problem # 2:  SKIN DISORDER (ICD-709.9)  Medications Added to Medication List This Visit: 1)  Novolog 100 Unit/ml Soln (Insulin aspart) .... Qac (three times a day) 18-8-20 units 2)  Levemir Flexpen 100 Unit/ml Soln (Insulin detemir) .... Inject 60 unit subcutaneously  every night   Patient Instructions: 1)  increase novolog to 18-8-20 2)  reduce levemir to 60 at night 3)  ret 3 mos for cpx with a1c and microalb  250.01 4)  ref derm   ]

## 2010-05-31 NOTE — Miscellaneous (Signed)
Summary: Orders Update  Clinical Lists Changes  Orders: Added new Test order of Venous Duplex Lower Extremity (Venous Duplex Lower) - Signed

## 2010-05-31 NOTE — Procedures (Signed)
Summary: Gastroenterology Colon  Gastroenterology Colon   Imported By: Barb Merino RN 07/08/2007 07:56:13  _____________________________________________________________________  External Attachment:    Type:   Image     Comment:   External Document

## 2010-05-31 NOTE — Progress Notes (Signed)
----   Converted from flag ---- ---- 01/29/2009 9:02 AM, [name] MD wrote: paper chart please ? date of last cardiac nuclear study ------------------------------       Additional Follow-up for Phone Call Additional follow up Details #2::    October 19, 2005 Its loaded in EMR Follow-up by: Gardenia Phlegm CMA,  January 29, 2009 11:07 AM   Laboratory Results

## 2010-05-31 NOTE — Procedures (Signed)
Summary: Colonoscopy  Patient: Jessica [name] Note: All result statuses are Final unless otherwise noted.  Tests: (1) Colonoscopy (COL)   COL Colonoscopy           [facility].     [place], [place]  [address]           COLONOSCOPY PROCEDURE REPORT           PATIENT:  Jessica, [name]  MR#:  [id number]     BIRTHDATE:  06-12-1950, 80 yrs. old  GENDER:  female     ENDOSCOPIST:  Loralee Pacas. Sharlett Iles, MD, [facility]     REF. BY:  Sean A. Loanne Drilling, M.D.     PROCEDURE DATE:  03/07/2010     PROCEDURE:  Colonoscopy with snare polypectomy     ASA CLASS:  Class II     INDICATIONS:  family history of colon cancer, history of     pre-cancerous (adenomatous) colon polyps SISTER     MEDICATIONS:   Fentanyl 50 mcg IV, Versed 8 mg IV           DESCRIPTION OF PROCEDURE:   After the risks benefits and     alternatives of the procedure were thoroughly explained, informed     consent was obtained.  Digital rectal exam was performed and     revealed no abnormalities.   The LB 180AL [id number] endoscope was     introduced through the anus and advanced to the cecum, which was     identified by both the appendix and ileocecal valve, without     limitations.  The quality of the prep was excellent, using     MoviPrep.  The instrument was then slowly withdrawn as the colon     was fully examined.     <<PROCEDUREIMAGES>>           FINDINGS:  A lipoma was found. sacttered LIPOMAS IN RIGHT AND LEFT     COLON.SEE PICTURES.  A sessile polyp was found at the hepatic     flexure. FLAT 6 MM POLYP SNARE EXCISED.#1.  Multiple polyps were     found in the recto-sigmoid colon. SMALL 3-3 MM POLYPS SNARE     EXCISED.#2.  Scattered diverticula were found in the sigmoid     colon.   Retroflexed views in the rectum revealed no     abnormalities.    The scope was then withdrawn from the patient     and the procedure completed.           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     RECOMMENDATIONS:     1) high fiber diet     2) Repeat Colonoscopy in 3 years.     3) Await biopsy results     REPEAT EXAM:  No           ______________________________     Loralee Pacas. Sharlett Iles, MD, [facility]           CC:           n.     eSIGNED:   Loralee Pacas. Jerrilynn Mikowski at 03/07/2010  11:11 AM           [place], Maikayla, [place] [phone]  Note: An exclamation mark (!) indicates a result that was not dispersed into the flowsheet. Document Creation Date: 03/07/2010 11:11 AM _______________________________________________________________________  (1) Order result status: Final Collection or observation date-time: 03/07/2010 11:03 Requested date-time:  Receipt date-time:  Reported date-time:  Referring Physician:   Ordering Physician: Verl Blalock [phone]) Specimen Source:  Source: Tawanna Cooler Order Number: [id number] Lab site:   Appended Document: Colonoscopy 5y f/u  Appended Document: Colonoscopy     Procedures Next Due Date:    Colonoscopy: 03/2015

## 2010-05-31 NOTE — Assessment & Plan Note (Signed)
Summary: POST HOSP/ WENT TO ER IN [facility]/ DISLOCAT...   Vital Signs:  Patient profile:   60 year old female Height:      65 inches (165.10 cm) Weight:      244.50 pounds (111.14 kg) BMI:     40.83 O2 Sat:      97 % on Room air Temp:     98.6 degrees F (37.00 degrees C) oral Pulse rate:   74 / minute BP sitting:   130 / 82  (left arm) Cuff size:   large  Vitals Entered By: Rebeca Alert MA (December 20, 2009 8:18 AM)  O2 Flow:  Room air CC: post hospital F/U/fall/aj Is Patient Diabetic? Yes   CC:  post hospital F/U/fall/aj.  History of Present Illness: 7 days ago, pt was at a hotel in [place]  she fell in the hotel bathroom, disclocating her right shoulder.  she was rx'ed with an immobilizer.   she also bruised her left leg.  she now has 7 days of slight swelling there, and assoc pain. no cbg record, but states cbg's are well-controlled   Current Medications (verified): 1)  Diovan 320 Mg  Tabs (Valsartan) .... Once Daily 2)  Bd U/f Short Pen Needle 31g X 8 Mm Misc (Insulin Pen Needle) .... Use Qid 3)  Gabapentin 300 Mg Caps (Gabapentin) .... Take 1 Capsule By Mouth Twice A Day 4)  Onetouch Ultra Test  Strp (Glucose Blood) .... 250.01, and Lancets Tid 5)  Miralax   Powd (Polyethylene Glycol 3350) .... One Scoop One To Two Times A Day As Needed 6)  Clorostrol .Marland Kitchen.. 1 At Night 7)  Advil .... As Needed For Back and Knee Pain 8)  Pravastatin Sodium 80 Mg Tabs (Pravastatin Sodium) .Marland Kitchen.. 1 Qd 9)  Humalog Mix 75/25 75-25 % Susp (Insulin Lispro Prot & Lispro) .... Take 65 U in Morning and 45 U At Night 10)  Hydrocodone-Acetaminophen 5-500 Mg Tabs (Hydrocodone-Acetaminophen) .Marland Kitchen.. 1 Tablet By Mouth Every 4 Hours As Needed For Pain  Allergies (verified): 1)  ! Nsaids  Past History:  Past Medical History: Last updated: 03/02/2007 ASTHMA, CHILDHOOD (ICD-493.00) HEMORRHOIDS (ICD-455.6) AUTONOMIC NEUROPATHY, DIABETIC (ICD-250.60) HYPERTENSION  (ICD-401.9) DIABETES MELLITUS, TYPE II (ICD-250.00) ANEMIA-IRON DEFICIENCY (ICD-280.9)    Review of Systems  The patient denies syncope.         denies hypoglycemia  Physical Exam  General:  obese.  no distress  Msk:  right shoulder:  rom severely limited by pain Extremities:  left leg:  there is ecchymosis at the medial aspect, just distal to the knee.1+ left pedal edema.     Impression & Recommendations:  Problem # 1:  SHOULDER DISLOCATION (ICD-831.00) Assessment New  Problem # 2:  DIABETES MELLITUS, TYPE II (ICD-250.00) apparently well-controlled  Problem # 3:  EDEMA (ICD-782.3) due to contusion  Medications Added to Medication List This Visit: 1)  Hydrocodone-acetaminophen 5-500 Mg Tabs (Hydrocodone-acetaminophen) .Marland Kitchen.. 1 tablet by mouth every 4 hours as needed for pain  Other Orders: Vascular Clinic (Vascular) Orthopedic Surgeon Referral (Ortho Surgeon) Est. Patient Level IV (78242)  Patient Instructions: 1)  check doppler of the left leg 2)  refer orthopedics.  you will be called with a day and time for an appointment. 3)  blood tests are being ordered for you today.  please call [phone] to hear your test results. 4)  check a1c with next month's blood tests.  same insulin for now.

## 2010-05-31 NOTE — Letter (Signed)
Summary: Patient Notice- Polyp Results  [facility]  [address] [place], [place] [address]   Phone: [phone]  Fax: [phone]        March 08, 2010 MRN: [id number]    [facility] [address] [place], [place]  [address]    Dear Ms. Scadden,  I am pleased to inform you that the colon polyp(s) removed during your recent colonoscopy was (were) found to be benign (no cancer detected) upon pathologic examination.  I recommend you have a repeat colonoscopy examination in 5_ years to look for recurrent polyps, as having colon polyps increases your risk for having recurrent polyps or even colon cancer in the future.The polyps were hyperplastic polyps. I would recommend 5 year followup because of your family history.  Should you develop new or worsening symptoms of abdominal pain, bowel habit changes or bleeding from the rectum or bowels, please schedule an evaluation with either your primary care physician or with me.  Additional information/recommendations:  _xx_ No further action with gastroenterology is needed at this time. Please      follow-up with your primary care physician for your other healthcare      needs.  __ Please call [phone] to schedule a return visit to review your      situation.  __ Please keep your follow-up visit as already scheduled.  __ Continue treatment plan as outlined the day of your exam.  Please call us if you are having persistent problems or have questions about your condition that have not been fully answered at this time.  Sincerely,  Sable Feil MD [facility]  This letter has been electronically signed by your physician.  Appended Document: Patient Notice- Polyp Results letter mailed

## 2010-05-31 NOTE — Assessment & Plan Note (Signed)
Summary: 3 MTH FU PER PT--STC   Vital Signs:  Patient profile:   60 year old female Height:      66 inches (167.64 cm) Weight:      246.38 pounds (111.99 kg) O2 Sat:      97 % on Room air Temp:     97.2 degrees F (36.22 degrees C) oral Pulse rate:   74 / minute BP sitting:   140 / 84  (left arm) Cuff size:   large  Vitals Entered By: Felipa Evener (July 01, 2009 11:16 AM)  O2 Flow:  Room air CC: 3 month followup/ pt states chest hurting,ears hurt/stopped, sore throat X5days/[name] Is Patient Diabetic? Yes   CC:  3 month followup/ pt states chest hurting, ears hurt/stopped, and sore throat X5days/Jessica [name].  History of Present Illness: pt states 3 days of pain at the right ear, nasal congestion, and associated moderate dry-quality cough.   no cbg record, but states cbg's are "200's since i have been sick." pt says her elevated bp is also due to her illness.  Current Medications (verified): 1)  Diovan 320 Mg  Tabs (Valsartan) .... Once Daily 2)  Bd U/f Short Pen Needle 31g X 8 Mm Misc (Insulin Pen Needle) .... Use Qid 3)  Gabapentin 300 Mg Caps (Gabapentin) .... Take 1 Capsule By Mouth Twice A Day 4)  Onetouch Ultra Test  Strp (Glucose Blood) .... 250.01, and Lancets Tid 5)  Miralax   Powd (Polyethylene Glycol 3350) .... One Scoop One To Two Times A Day As Needed 6)  Novolog Mix 70/30 Flexpen 70-30 % Susp (Insulin Aspart Prot & Aspart) .... 50 Units Two Times A Day 7)  Clorostrol .Marland Kitchen.. 1 At Night 8)  Advil .... As Needed For Back and Knee Pain 9)  Pravastatin Sodium 80 Mg Tabs (Pravastatin Sodium) .Marland Kitchen.. 1 Qd 10)  Neomycin-Polymyxin-Hc 3.5-10000-1 Soln (Neomycin-Polymyxin-Hc) .... 2 Drops Three Times A Day To The Right Ear.  Allergies (verified): 1)  ! Nsaids  Past History:  Past Medical History: Last updated: 03/02/2007 ASTHMA, CHILDHOOD (ICD-493.00) HEMORRHOIDS (ICD-455.6) AUTONOMIC NEUROPATHY, DIABETIC (ICD-250.60) HYPERTENSION (ICD-401.9) DIABETES MELLITUS, TYPE II  (ICD-250.00) ANEMIA-IRON DEFICIENCY (ICD-280.9)    Review of Systems  The patient denies fever.         denies hypoglycemia  Physical Exam  General:  obese.  no distress  Head:  head: no deformity eyes: no periorbital swelling, no proptosis external nose and ears are normal mouth: no lesion seen Ears:  TM's intact and clear with normal canals with grossly normal hearing.   Lungs:  Clear to auscultation bilaterally. Normal respiratory effort.  Additional Exam:  Hemoglobin A1C       [H]  8.4 %    Impression & Recommendations:  Problem # 1:  DIABETES MELLITUS, TYPE II (ICD-250.00) needs increased rx  Problem # 2:  HYPERTENSION (ICD-401.9) ? exac by #3  Problem # 3:  uri new  Medications Added to Medication List This Visit: 1)  Azithromycin 500 Mg Tabs (Azithromycin) .Marland Kitchen.. 1 once daily 2)  Humalog Mix 75/25 Pen 75-25 % Susp (Insulin lispro prot & lispro) .... 55 units two times a day, and pen needles two times a day  Other Orders: TLB-A1C / Hgb A1C (Glycohemoglobin) (83036-A1C) Est. Patient Level IV (16384)  Patient Instructions: 1)  pending the test results: 2)  increase humalog mix 75/25, to 55 units 2 x a day. 3)  Please schedule a follow-up appointment in 3 months. 4)  tests are being  ordered for you today.  a few days after the test(s), please call [phone] to hear your test results. 5)  azithromycin 500 mg once daily. 6)  loratadine-d (non-prescription) as needed for congestion. 7)  (update: i left message on phone-tree:  rx as we discussed.  we'll follow the bp). Prescriptions: PRAVASTATIN SODIUM 80 MG TABS (PRAVASTATIN SODIUM) 1 qd  #30 x 3   Entered and Authorized by:   [name] MD   Signed by:   [name] MD on 07/01/2009   Method used:   [facility]        [id number]       [address]       [place], [place]  [address]       Ph: [phone] or [phone]       Fax: [phone]   RxID:    [id number] GABAPENTIN 300 MG CAPS (GABAPENTIN) Take 1 capsule by mouth twice a day  #60 Capsule x 3   Entered and Authorized by:   [name] MD   Signed by:   [name] MD on 07/01/2009   Method used:   [facility]        [facility]       [address]       [place], [place]  [address]       Ph: [phone] or [phone]       Fax: [phone]   RxID:   [id number] HUMALOG MIX 75/25 PEN 75-25 % SUSP (INSULIN LISPRO PROT & LISPRO) 55 units two times a day, and pen needles two times a day  #3 boxes x 11   Entered and Authorized by:   [name] MD   Signed by:   [name] MD on 07/01/2009   Method used:   [facility]        [facility]  [place] [facility] [facility]       [address]       [place], [place]  [address]       Ph: [phone] or [phone]       Fax: [phone]   RxID:   [id number] AZITHROMYCIN 500 MG TABS (AZITHROMYCIN) 1 once daily  #6 x 0   Entered and Authorized by:   [name] MD   Signed by:   [name] MD on 07/01/2009   Method used:   [facility]        [facility]  [place] [address]       [address]       [place], [place]  [address]       Ph: [phone] or [phone]       Fax: [phone]   RxID:   [id number]

## 2010-05-31 NOTE — Letter (Signed)
Summary: Colonoscopy Letter  [facility]  [place], [place] [address]   Phone: [phone]  Fax: [phone]      November 19, 2009 MRN: [id number]   [facility] [address] [place], [place]  [address]   Dear Ms. Freehling,   According to your medical record, it is time for you to schedule a Colonoscopy. The American Cancer Society recommends this procedure as a method to detect early colon cancer. Patients with a family history of colon cancer, or a personal history of colon polyps or inflammatory bowel disease are at increased risk.  This letter has beeen generated based on the recommendations made at the time of your procedure. If you feel that in your particular situation this may no longer apply, please contact our office.  Please call our office at [phone] to schedule this appointment or to update your records at your earliest convenience.  Thank you for cooperating with [place] to provide you with the very best care possible.   Sincerely,    Loralee Pacas. Sharlett Iles, M.D.  [facility] [phone]

## 2010-05-31 NOTE — Miscellaneous (Signed)
Summary: BONE DENSITY  Clinical Lists Changes  Orders: Added new Test order of T-Bone Densitometry [phone]) - Signed Added new Test order of T-Lumbar Vertebral Assessment [phone]) - Signed

## 2010-05-31 NOTE — Assessment & Plan Note (Signed)
Summary: INSULIN LEVELS/PER TRIAGE/NWS   Vital Signs:  Patient profile:   60 year old female Height:      66 inches (167.64 cm) Weight:      243.13 pounds (110.51 kg) O2 Sat:      97 % on Room air Temp:     97.5 degrees F (36.39 degrees C) oral Pulse rate:   68 / minute BP sitting:   128 / 72  (left arm) Cuff size:   large  Vitals Entered By: Gardenia Phlegm CMA (January 29, 2009 8:19 AM)  O2 Flow:  Room air CC: 2 wk follow up on meds/ CF Is Patient Diabetic? Yes   CC:  2 wk follow up on meds/ CF.  History of Present Illness: pt states she feels well in general.  she brings a record of her cbg's which i have reviewed today.  it is in general high in am than pm.  it ranges from 176-317.   pt says she is taking pravachol as rx'ed. aortic atherosclerosis is noted on abd [place].  Current Medications (verified): 1)  Diovan 320 Mg  Tabs (Valsartan) .... Once Daily 2)  Bd U/f Short Pen Needle 31g X 8 Mm Misc (Insulin Pen Needle) .... Use Qid 3)  Gabapentin 300 Mg Caps (Gabapentin) .... Take 1 Capsule By Mouth Twice A Day 4)  Onetouch Ultra Test  Strp (Glucose Blood) .... 250.01, and Lancets Tid 5)  Miralax   Powd (Polyethylene Glycol 3350) .... One Scoop One To Two Times A Day As Needed 6)  Novolog Mix 70/30 Flexpen 70-30 % Susp (Insulin Aspart Prot & Aspart) .... 60 Units Am and 30 Units Pm 7)  Clorostrol .Marland Kitchen.. 1 At Night 8)  Advil .... As Needed For Back and Knee Pain  Allergies (verified): 1)  ! Nsaids  Past History:  Past Medical History: Last updated: 03/02/2007 ASTHMA, CHILDHOOD (ICD-493.00) HEMORRHOIDS (ICD-455.6) AUTONOMIC NEUROPATHY, DIABETIC (ICD-250.60) HYPERTENSION (ICD-401.9) DIABETES MELLITUS, TYPE II (ICD-250.00) ANEMIA-IRON DEFICIENCY (ICD-280.9)    Review of Systems  The patient denies hypoglycemia, weight loss, and weight gain.    Physical Exam  General:  obese.  no distress  Pulses:  dorsalis pedis intact bilat.   Extremities:  no deformity.  no  ulcer on the feet.  feet are of normal color and temp.  no edema  Neurologic:  sensation is intact to touch on the feet  Additional Exam:  Hemoglobin A1C       [H]  8.7 %    Impression & Recommendations:  Problem # 1:  DIABETES MELLITUS, TYPE II (ICD-250.00) needs increased rx  Problem # 2:  HYPERCHOLESTEROLEMIA (ICD-272.0)  Problem # 3:  atherosclerosis  Medications Added to Medication List This Visit: 1)  Novolog Mix 70/30 Flexpen 70-30 % Susp (Insulin aspart prot & aspart) .... 60 units am and 50 units pm 2)  Clorostrol  .Marland Kitchen.. 1 at night 3)  Advil  .... As needed for back and knee pain 4)  Pravastatin Sodium 80 Mg Tabs (Pravastatin sodium) .Marland Kitchen.. 1 qd  Other Orders: Est. Patient Level IV ([address])  Patient Instructions: 1)  increase novolog mix 70/30, to 60 units am and 50 units pm. 2)  Please schedule a follow-up appointment in 2 weeks. 3)  we discussed risk-factor control. 4)  continue pravachol     Exercise Stress Test  Procedure date:  10/19/2005  Findings:      Overall impression: There is no sign of scar or ischemia

## 2010-05-31 NOTE — Progress Notes (Signed)
Summary: new insulin  Phone Note Call from Patient Call back at Home Phone [phone]   Caller: Patient Reason for Call: Talk to Doctor Summary of Call: Recieved results concerning labs. pt states md wanted her to start new insulin. she has agreed to start new insulin. [facility] Also pt want to know does she need to complete old insulin before starting new Initial call taken by: Tomma Lightning,  January 15, 2009 1:53 PM  Follow-up for Phone Call        i sent rx to pharmacy.  you can discard current insulin.  please note that this replaces both current insulins.  return 2 weeks.  call sooner if any low sugar. Follow-up by: [name] MD,  January 15, 2009 2:13 PM  Additional Follow-up for Phone Call Additional follow up Details #1::        Call pt has left $ work left msg with husband md sent new rx to pharm also to return back in 2 weeks Additional Follow-up by: Tomma Lightning,  January 15, 2009 2:59 PM    New/Updated Medications: NOVOLOG MIX 70/30 FLEXPEN 70-30 % SUSP (INSULIN ASPART PROT & ASPART) 60 units am and 30 units pm Prescriptions: NOVOLOG MIX 70/30 FLEXPEN 70-30 % SUSP (INSULIN ASPART PROT & ASPART) 60 units am and 30 units pm  #2 boxes x 5   Entered and Authorized by:   [name] MD   Signed by:   [name] MD on 01/15/2009   Method used:   [facility]        [id number]       [address]       [place], [place]  [address]       Ph: [phone] or [phone]       Fax: [phone]   RxID:   [id number]

## 2010-05-31 NOTE — Progress Notes (Signed)
  Phone Note Call from Patient Call back at Home Phone [phone]   Caller: [phone] Call For: Cassandria Anger MD Summary of Call: per [facility] call need one touch lancets   [facility]  [place] [address] [address] [place], [place]  [address] Ph: [phone] or [phone] Fax: [phone] Initial call taken by: [name],  April 08, 2008 10:06 AM  Follow-up for Phone Call        was sent last week.  i have resent today Follow-up by: [name] MD,  April 08, 2008 7:38 PM  Additional Follow-up for Phone Call Additional follow up Details #1::        called pt to inform  pt made aware Additional Follow-up by: [name],  April 09, 2008 9:46 AM      Prescriptions: Donald Siva TEST  STRP (GLUCOSE BLOOD) 250.01, and lancets tid  #100 x 11   Entered and Authorized by:   [name] MD   Signed by:   [name] MD on 04/08/2008   Method used:   [facility]        [facility]       [address]       [place], [place]  [address]       Ph: [phone] or [phone]       Fax: [phone]   RxID:   [id number]

## 2010-05-31 NOTE — Progress Notes (Signed)
Summary: humalog 75/25  Phone Note From Pharmacy   [facility] [place] Summary of Call: Recieved rx for humalog 75-25 quanity 15. quanity will only last 12 days. Need new script for 1 month. Initial call taken by: Tomma Lightning,  October 11, 2009 4:52 PM    Prescriptions: HUMALOG MIX 75/25 PEN 75-25 % SUSP (INSULIN LISPRO PROT & LISPRO) 65 units with breakfast, and 45 units with evening meal, and pen needles two times a day  #1 month x 5   Entered by:   Tomma Lightning   Authorized by:   [name] MD   Signed by:   Tomma Lightning on 10/11/2009   Method used:   [facility]        [id number]       [address]       [place],   [address]       Ph: [phone] or [phone]       Fax: [phone]   RxID:   [id number]

## 2010-05-31 NOTE — Progress Notes (Signed)
Summary: problems with med  Phone Note Call from Patient Call back at Home Phone [phone]   Caller: [phone] Call For: Jessica [name] Summary of Call: per [name] call was prescribe ZOCOR 80 MG  TABS (SIMVASTATIN) c/o of med is making her sick c.o nauseated ,stomach  upset want to know if she can stop taking med Initial call taken by: [name],  May 04, 2008 11:04 AM  Follow-up for Phone Call        change tp pravachol 80/d--sent Follow-up by: [name] MD,  May 04, 2008 12:54 PM  Additional Follow-up for Phone Call Additional follow up Details #1::        Phone Call Completed, Rx Called In, Provider Notified pt was informed and made aware Additional Follow-up by: [name],  May 04, 2008 1:36 PM    New/Updated Medications: PRAVASTATIN SODIUM 80 MG TABS (PRAVASTATIN SODIUM) qhs   Prescriptions: PRAVASTATIN SODIUM 80 MG TABS (PRAVASTATIN SODIUM) qhs  #30 x 11   Entered and Authorized by:   [name] MD   Signed by:   [name] MD on 05/04/2008   Method used:   [facility]        [facility]  [place] [address] [facility]       [address]       [place], [place]  [address]       Ph: [phone] or [phone]       Fax: [phone]   RxID:   [id number]

## 2010-05-31 NOTE — Letter (Signed)
Summary: [facility]   Imported By: Jerrye Noble D'jimraou 03/26/2007 15:30:39  _____________________________________________________________________  External Attachment:    Type:   Image     Comment:   External Document

## 2010-05-31 NOTE — Assessment & Plan Note (Signed)
Summary: FU Jessica [name] #   Vital Signs:  Patient profile:   60 year old female Height:      66 inches (167.64 cm) Weight:      245.8 pounds (111.73 kg) O2 Sat:      98 % on Room air Temp:     98.4 degrees F (36.89 degrees C) oral Pulse rate:   71 / minute BP sitting:   130 / 82  (left arm) Cuff size:   large  Vitals Entered By: Tomma Lightning (April 01, 2009 9:41 AM)  O2 Flow:  Room air CC: follow-up visit Is Patient Diabetic? Yes Did you bring your meter with you today? No Pain Assessment Patient in pain? no        CC:  follow-up visit.  History of Present Illness: pt says cbg is lowest in the afternoon (70's), and at 3 am.  it is high-100's in am.  pt states she feels well in general. pt states few days of pain right ear, and associated slight bleeding.  Current Medications (verified): 1)  Diovan 320 Mg  Tabs (Valsartan) .... Once Daily 2)  Bd U/f Short Pen Needle 31g X 8 Mm Misc (Insulin Pen Needle) .... Use Qid 3)  Gabapentin 300 Mg Caps (Gabapentin) .... Take 1 Capsule By Mouth Twice A Day 4)  Onetouch Ultra Test  Strp (Glucose Blood) .... 250.01, and Lancets Tid 5)  Miralax   Powd (Polyethylene Glycol 3350) .... One Scoop One To Two Times A Day As Needed 6)  Novolog Mix 70/30 Flexpen 70-30 % Susp (Insulin Aspart Prot & Aspart) .... 60 Units Am and 50 Units Pm 7)  Clorostrol .Marland Kitchen.. 1 At Night 8)  Advil .... As Needed For Back and Knee Pain 9)  Pravastatin Sodium 80 Mg Tabs (Pravastatin Sodium) .Marland Kitchen.. 1 Qd  Allergies (verified): 1)  ! Nsaids  Past History:  Past Medical History: Last updated: 03/02/2007 ASTHMA, CHILDHOOD (ICD-493.00) HEMORRHOIDS (ICD-455.6) AUTONOMIC NEUROPATHY, DIABETIC (ICD-250.60) HYPERTENSION (ICD-401.9) DIABETES MELLITUS, TYPE II (ICD-250.00) ANEMIA-IRON DEFICIENCY (ICD-280.9)    Review of Systems  The patient denies fever and syncope.    Physical Exam  General:  normal appearance.   Ears:  right eac: there is slight dried blood and  canal swelling Additional Exam:  Vitamin D (25-Hydroxy)  26 ng/mL  Hemoglobin A1C       [H]  8.2 %   Impression & Recommendations:  Problem # 1:  DIABETES MELLITUS, TYPE II (ICD-250.00) this is the best control this pt should aim for, given this regimen, which does a poor job of matching insulin to her changing needs throughout the day therapy limited by pt's request for least expensive meds  Problem # 2:  mild vitamin-d deficiency  Problem # 3:  OTITIS EXTERNA, ACUTE (ICD-380.12) Assessment: New  Medications Added to Medication List This Visit: 1)  Novolog Mix 70/30 Flexpen 70-30 % Susp (Insulin aspart prot & aspart) .... 50 units two times a day 2)  Neomycin-polymyxin-hc 3.5-10000-1 Soln (Neomycin-polymyxin-hc) .... 2 drops three times a day to the right ear.  Other Orders: TLB-A1C / Hgb A1C (Glycohemoglobin) (83036-A1C) T-Vitamin D (25-Hydroxy) ([address]) Est. Patient Level IV ([address])  Patient Instructions: 1)  reduce novolog mix 70/30, to 50 units 2 x a day. 2)  Please schedule a follow-up appointment in 3 months. 3)  tests are being ordered for you today.  a few days after the test(s), please call [phone] to hear your test results. 4)  cortisporin otic 2 drops  three times a day  5)  (update: i left message on phone-tree:  rx as we discussed.  also take vitamin-d 1000 units/day). Prescriptions: NEOMYCIN-POLYMYXIN-HC 3.5-10000-1 SOLN (NEOMYCIN-POLYMYXIN-HC) 2 drops three times a day to the right ear.  #1 bottle x 1   Entered and Authorized by:   [name] MD   Signed by:   [name] MD on 04/01/2009   Method used:   [facility]        [id number]       [address]       [place], [place]  [address]       Ph: [phone] or [phone]       Fax: [phone]   RxID:   [id number] NOVOLOG MIX 70/30 FLEXPEN 70-30 % SUSP (INSULIN ASPART PROT & ASPART) 50 units two times a day  #4 vials x 11   Entered and Authorized by:   [name] MD   Signed by:   [name] MD on 04/01/2009   Method used:   [facility]        [facility]  [place] [address]       [address]       [place], [place]  [address]       Ph: [phone] or [phone]       Fax: [phone]   RxID:   [id number]

## 2010-05-31 NOTE — Letter (Signed)
Summary: [facility] Instructions  [facility]  [place], [place] [address]   Phone: [phone]  Fax: [phone]       Jessica [name]    [birth year]    MRN: [id number]        Procedure Day /Date: Monday 01/24/2010     Arrival Time: 8:30 am      Procedure Time: 9:30 am     Location of Procedure:                    _x _  [facility] (4th Floor)                        [place]   Starting 5 days prior to your procedure Wednesday 9/21 do not eat nuts, seeds, popcorn, corn, beans, peas,  salads, or any raw vegetables.  Do not take any fiber supplements (e.g. Metamucil, Citrucel, and Benefiber).  THE DAY BEFORE YOUR PROCEDURE         DATE: Sunday 9/25  1.  Drink clear liquids the entire day-NO SOLID FOOD  2.  Do not drink anything colored red or purple.  Avoid juices with pulp.  No orange juice.  3.  Drink at least 64 oz. (8 glasses) of fluid/clear liquids during the day to prevent dehydration and help the prep work efficiently.  CLEAR LIQUIDS INCLUDE: Water Jello Ice Popsicles Tea (sugar ok, no milk/cream) Powdered fruit flavored drinks Coffee (sugar ok, no milk/cream) Gatorade Juice: apple, white grape, white cranberry  Lemonade Clear bullion, consomm, broth Carbonated beverages (any kind) Strained chicken noodle soup Hard Candy                             4.  In the morning, mix first dose of MoviPrep solution:    Empty 1 Pouch A and 1 Pouch B into the disposable container    Add lukewarm drinking water to the top line of the container. Mix to dissolve    Refrigerate (mixed solution should be used within 24 hrs)  5.  Begin drinking the prep at 5:00 p.m. The MoviPrep container is divided by 4 marks.   Every 15 minutes drink the solution down to the next mark (approximately 8 oz) until the full liter is complete.   6.  Follow completed prep with 16 oz of clear liquid of your choice (Nothing  red or purple).  Continue to drink clear liquids until bedtime.  7.  Before going to bed, mix second dose of MoviPrep solution:    Empty 1 Pouch A and 1 Pouch B into the disposable container    Add lukewarm drinking water to the top line of the container. Mix to dissolve    Refrigerate  THE DAY OF YOUR PROCEDURE      DATE: Monday 9/26  Beginning at 4:30 a.m. (5 hours before procedure):         1. Every 15 minutes, drink the solution down to the next mark (approx 8 oz) until the full liter is complete.  2. Follow completed prep with 16 oz. of clear liquid of your choice.    3. You may drink clear liquids until 7:30 am (2 HOURS BEFORE PROCEDURE).   MEDICATION INSTRUCTIONS  Unless otherwise instructed, you should take regular prescription medications with a small sip of water   as early as possible the morning of your  procedure.  Diabetic patients - see separate instructions.          OTHER INSTRUCTIONS  You will need a responsible adult at least 60 years of age to accompany you and drive you home.   This person must remain in the waiting room during your procedure.  Wear loose fitting clothing that is easily removed.  Leave jewelry and other valuables at home.  However, you may wish to bring a book to read or  an iPod/MP3 player to listen to music as you wait for your procedure to start.  Remove all body piercing jewelry and leave at home.  Total time from sign-in until discharge is approximately 2-3 hours.  You should go home directly after your procedure and rest.  You can resume normal activities the  day after your procedure.  The day of your procedure you should not:   Drive   Make legal decisions   Operate machinery   Drink alcohol   Return to work  You will receive specific instructions about eating, activities and medications before you leave.    The above instructions have been reviewed and explained to me by   Ulice Dash RN  January 11, 2010 10:57 AM     I fully understand and can verbalize these instructions _____________________________ Date _________

## 2010-05-31 NOTE — Assessment & Plan Note (Signed)
Summary: MIRALAX REFILL...AS.    History of Present Illness Primary GI MD: Verl Blalock MD FACP FAGA Chief Complaint: Needs refill on Miralax. Pt denies any GI complaints unless she does not take her Miralax. Pt's sister just died of colon cancer and pt c/o of some increased anxiety.  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 She comes the office today for cranial GI check up and really denies problems except for chronic continue constipation but no rectal bleeding or abdominal pain, anorexia or weight loss. She denies reflux symptoms her hepatobiliary complaints. Last colonoscopy was 4 years ago and she does have a history of colon cancer Sr. age 34. Associated with her diabetes has peripheral neuropathy and she is on gabapentin. She denies use of NSAIDs, alcohol, or cigarettes. She otherwise follows a fairly regular diet but does not like fruits or vegetables.   GI Review of Systems      Denies abdominal pain, acid reflux, belching, bloating, chest pain, dysphagia with liquids, dysphagia with solids, heartburn, loss of appetite, nausea, vomiting, vomiting blood, weight loss, and  weight gain.      Reports constipation.     Denies anal fissure, black tarry stools, change in bowel habit, diarrhea, diverticulosis, fecal incontinence, heme positive stool, hemorrhoids, irritable bowel syndrome, jaundice, light color stool, liver problems, rectal bleeding, and  rectal pain.    Current Medications (verified): 1)  Diovan 320 Mg  Tabs (Valsartan) .... Once Daily 2)  Bd U/f Short Pen Needle 31g X 8 Mm Misc (Insulin Pen Needle) .... Use Qid 3)  Gabapentin 300 Mg Caps (Gabapentin) .... Take 1 Capsule By Mouth Twice A Day 4)  Levemir Flexpen 100 Unit/ml Soln (Insulin Detemir) .... Inject 45 Unit Subcutaneously  Every Night 5)  Novolog Flexpen  100 Unit/ml Soln (Insulin Aspart) .... Three Times A Day (Qac) 23-13-28 Units 6)  Onetouch Ultra Test  Strp (Glucose Blood) .... 250.01, and Lancets Tid 7)  Miralax   Powd (Polyethylene Glycol 3350) .... One Scoop One To Two Times A Day As Needed  Allergies (verified): 1)  ! Nsaids  Past History:  Past medical, surgical, family and social histories (including risk factors) reviewed for relevance to current acute and chronic problems.  Past Medical History: Reviewed history from 03/02/2007 and no changes required. ASTHMA, CHILDHOOD (ICD-493.00) HEMORRHOIDS (ICD-455.6) AUTONOMIC NEUROPATHY, DIABETIC (ICD-250.60) HYPERTENSION (ICD-401.9) DIABETES MELLITUS, TYPE II (ICD-250.00) ANEMIA-IRON DEFICIENCY (ICD-280.9)    Past Surgical History: tubal ligation appendectomy left breast cyst removal  Family History: Reviewed history from 04/01/2008 and no changes required. sister has colon cancer Family History of Diabetes: Sister, Brother  Social History: Reviewed history from 04/01/2008 and no changes required. 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  Alcohol Use - no Daily Caffeine Use Illicit Drug Use - no Drug Use:  no  Review of Systems       The patient complains of anxiety-new.  The patient denies allergy/sinus, anemia, arthritis/joint pain, back pain, blood in urine, breast changes/lumps, change in vision, confusion, cough, coughing up blood, depression-new, fainting, fatigue, fever, headaches-new, hearing problems, heart murmur, heart rhythm changes, itching, menstrual pain, muscle pains/cramps, night sweats, nosebleeds, pregnancy symptoms, shortness of breath, skin rash, sleeping problems, sore throat, swelling of feet/legs, swollen lymph glands, thirst - excessive , urination - excessive , urination changes/pain, urine leakage, vision changes, and voice change.    Vital Signs:  Patient profile:   60 year old female Height:      66 inches Weight:       242.50 pounds BMI:     39.28 Pulse rate:   80 / minute Pulse rhythm:   regular BP sitting:   126 / 74  (right arm) Cuff size:   large  Vitals Entered By: Marlon Pel CMA (November 05, 2008 11:14 AM)  Physical Exam  General:  Well developed, well nourished, no acute distress.healthy appearing.   Head:  Normocephalic and atraumatic. Eyes:  PERRLA, no icterus.exam deferred to patient's ophthalmologist.   Neck:  Supple; no masses or thyromegaly.I cannot appreciate any thyromegaly or neck masses at this time. Abdomen:  Soft, nontender and nondistended. No masses, hepatosplenomegaly or hernias noted. Normal bowel sounds.obese.   Msk:  Symmetrical with no gross deformities. Normal posture. Pulses:  Normal pulses noted. Extremities:  No clubbing, cyanosis, edema or deformities noted. Neurologic:  Alert and  oriented x4;  grossly normal neurologically. Cervical Nodes:  No significant cervical adenopathy. Inguinal Nodes:  No significant inguinal adenopathy. Psych:  Alert and cooperative. Normal mood and affect.   Impression & Recommendations:  Problem # 1:  CONSTIPATION (ICD-564.00) Assessment Improved As Long as she is on might be MiraLax 8 ounces she has regular bowel movements. I have renewed his prescription will have her do [facility] cards for occult blood. She is due for followup colonoscopy next year. I also advised an increased fiber diet and liberal p.o. fluids.  Problem # 2:  NECK MASS (ICD-784.2) Assessment: Improved She has a history of benign thyroid cyst which is followed by Dr. Loanne Drilling. Neck exam today was fairly unremarkable and she has no symptoms of thyroid dysfunction.  Problem # 3:  DIABETES MELLITUS, TYPE II (ICD-250.00) Assessment: Improved Continue insulin therapy per Dr. Loanne Drilling.  Problem # 4:  GASTROPARESIS (ICD-536.3) Assessment: Improved This Does not seem to be a problem for the patient at this time. She does eat frequent small meals.  Problem # 5:   FAMILY HX COLON CANCER (ICD-V16.0) Assessment: Unchanged colonoscopy in one years time as per clinical protocol.  Patient Instructions: 1)  Copy sent to : Dr. Loanne Drilling and primary care 2)  Please continue current medications.  3)  renew MiraLax prescription 4)  Check stool cards for occult blood 5)  Followup colonoscopy in one years time.

## 2010-05-31 NOTE — Letter (Signed)
Summary: [facility]: Immunoassay Fecal Occult Blood (iFOB) Order [facility]  [place], [place] [address]   Phone: [phone]  Fax: [phone]      [facility]: Immunoassay Fecal Occult Blood (iFOB) Order Form   November 05, 2008 MRN: [id number]   [facility] [place] [birth year]   Physicican Name: Dr. Verl Blalock    Diagnosis Code: 564.0      Bernita Buffy CMA

## 2010-05-31 NOTE — Progress Notes (Signed)
Summary: Glucose levels  Phone Note Call from Patient Call back at Home Phone [phone]   Caller: Patient Summary of Call: pt called to give MD update of glucose levels since she was switched to Novolog mix.  AM: ranging from 202-317; PM: ranging from 176-249. Pt would like to know if her insulin dose should be increased? Initial call taken by: Crissie Sickles, CMA,  January 28, 2009 10:48 AM  Follow-up for Phone Call        ov next available Follow-up by: [name] MD,  January 28, 2009 12:44 PM  Additional Follow-up for Phone Call Additional follow up Details #1::        pt informed, transferred to schedule OV Additional Follow-up by: Crissie Sickles, CMA,  January 28, 2009 1:00 PM

## 2010-06-08 NOTE — Assessment & Plan Note (Signed)
Summary: COUGH/CONGESTION/RCD   Vital Signs:  Patient profile:   60 year old female Weight:      247 pounds BMI:     41.25 Pulse rate:   98 / minute BP sitting:   178 / 80  (left arm) Cuff size:   large  Vitals Entered By: Charlsie Quest, CMA (May 28, 2010 11:40 AM) CC: Productive cough, h/a, congestion since thursday/SD   History of Present Illness: Patient is a 60yo AA female with worsening URI symptoms. She is experiencing cough productive of yellow sputum, HA, nasal congestion, myalgias, malasie, pressure in b/l  ears, sneezing and anorexia. She denies any CP/palp/SOB/GI or GU c/o otherwise.  Allergies: 1)  ! Nsaids  Past History:  Past medical history reviewed for relevance to current acute and chronic problems. Social history (including risk factors) reviewed for relevance to current acute and chronic problems.  Past Medical History: Reviewed history from 03/02/2007 and no changes required. ASTHMA, CHILDHOOD (ICD-493.00) HEMORRHOIDS (ICD-455.6) AUTONOMIC NEUROPATHY, DIABETIC (ICD-250.60) HYPERTENSION (ICD-401.9) DIABETES MELLITUS, TYPE II (ICD-250.00) ANEMIA-IRON DEFICIENCY (ICD-280.9)    Social History: Reviewed history from 11/05/2008 and no changes required. 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  Alcohol Use - no Daily Caffeine Use Illicit Drug Use - no  Review of Systems      See HPI  Physical Exam  General:  Well-developed,well-nourished,in no acute distress; alert,appropriate and cooperative throughout examination Head:  Normocephalic and atraumatic without obvious abnormalities. No apparent alopecia or balding. Mouth:  Oral mucosa and oropharynx without lesions or exudates.  Teeth in good repair. Neck:  No deformities, masses, or tenderness noted. Lungs:  Normal respiratory effort, chest expands symmetrically. Lungs are clear to auscultation, no crackles or wheezes. Heart:  Normal rate and regular rhythm. S1 and S2 normal  without gallop, click, rub or other extra sounds. Abdomen:  Bowel sounds positive,abdomen soft and non-tender without masses, organomegaly or hernias noted. Pulses:  R and L carotid,radial,femoral,dorsalis pedis and posterior tibial pulses are full and equal bilaterally Extremities:  No clubbing, cyanosis, edema, or deformity noted with normal full range of motion of all joints.   Cervical Nodes:  No lymphadenopathy noted Psych:  Cognition and judgment appear intact. Alert and cooperative with normal attention span and concentration. No apparent delusions, illusions, hallucinations   Impression & Recommendations:  Problem # 1:  SINUSITIS, ACUTE (ICD-461.9)  Her updated medication list for this problem includes:    Zithromax 250 Mg Tabs (Azithromycin) .Marland Kitchen... 2 tabs by mouth once and then 1 tab by mouth daily    Tussionex Pennkinetic Er 10-8 Mg/4m Lqcr (Hydrocod polst-chlorphen polst) ..Marland Kitchen.. 1 tsp by mouth two times a day as needed cough Increaase hydration and rest  Problem # 2:  HYPERTENSION (ICD-401.9)  Her updated medication list for this problem includes:    Diovan 320 Mg Tabs (Valsartan) ..... Once daily Did not take her Diovan this morning. She is advised to follow up with PMD and to take her Diovan priort to that visit.  Complete Medication List: 1)  Diovan 320 Mg Tabs (Valsartan) .... Once daily 2)  Bd U/f Short Pen Needle 31g X 8 Mm Misc (Insulin pen needle) .... Use qid 3)  Gabapentin 300 Mg Caps (Gabapentin) .... Take 1 capsule by mouth twice a day 4)  Onetouch Ultra Test Strp (Glucose blood) .... 250.01, and lancets tid 5)  Miralax Powd (Polyethylene glycol 3350) .... One scoop one to two times a day as needed 6)  Clorostrol  ..Marland KitchenMarland KitchenMarland Kitchen1  at night 7)  Advil  .... As needed for back and knee pain 8)  Pravastatin Sodium 80 Mg Tabs (Pravastatin sodium) .Marland Kitchen.. 1 qd 9)  Humalog Mix 75/25 75-25 % Susp (Insulin lispro prot & lispro) .... Take 65 u in morning and 45 u at night 10)   Hydrocodone-acetaminophen 5-500 Mg Tabs (Hydrocodone-acetaminophen) .Marland Kitchen.. 1 tablet by mouth every 4 hours as needed for pain 11)  Zithromax 250 Mg Tabs (Azithromycin) .... 2 tabs by mouth once and then 1 tab by mouth daily 12)  Tussionex Pennkinetic Er 10-8 Mg/57m Lqcr (Hydrocod polst-chlorphen polst) ..Marland Kitchen. 1 tsp by mouth two times a day as needed cough  Patient Instructions: 1)  Please schedule a follow-up appointment as needed with PMD if symptoms persist. 2)  Need to increase fluids for next two days and avoid carbs especially simple carbs. 3)  Mucinex 6021mby mouth two times a day x 10 days Prescriptions: TUSSIONEX PENNKINETIC ER 10-8 MG/5ML LQCR (HYDROCOD POLST-CHLORPHEN POLST) 1 tsp by mouth two times a day as needed cough  #4oz x 0   Entered and Authorized by:   StPenni HomansD   Signed by:   StPenni HomansD on 05/28/2010   Method used:   Print then Give to Patient   RxID:   [id number] 250 MG TABS (AZITHROMYCIN) 2 tabs by mouth once and then 1 tab by mouth daily  #6 x 0   Entered and Authorized by:   StPenni HomansD   Signed by:   StPenni HomansD on 05/28/2010   Method used:   Electronically to        [id number]       [address]     [place]  [id number]     Ph: [phone]     Fax: [phone] RxID:   [id number]  Orders Added: 1)  Est. Patient Level III [[id number]]

## 2010-07-12 LAB — GLUCOSE, CAPILLARY
Glucose-Capillary: 175 mg/dL — ABNORMAL HIGH (ref 70–99)
Glucose-Capillary: 218 mg/dL — ABNORMAL HIGH (ref 70–99)

## 2010-07-22 ENCOUNTER — Ambulatory Visit (INDEPENDENT_AMBULATORY_CARE_PROVIDER_SITE_OTHER): Payer: 59 | Admitting: Endocrinology

## 2010-07-22 ENCOUNTER — Encounter: Payer: Self-pay | Admitting: Endocrinology

## 2010-07-22 VITALS — BP 138/80 | HR 62 | Temp 98.2°F | Ht 69.0 in | Wt 250.8 lb

## 2010-07-22 DIAGNOSIS — J069 Acute upper respiratory infection, unspecified: Secondary | ICD-10-CM | POA: Insufficient documentation

## 2010-07-22 MED ORDER — CEFUROXIME AXETIL 250 MG PO TABS: 250.0000 mg | ORAL_TABLET | Freq: Two times a day (BID) | ORAL | Status: AC

## 2010-07-22 NOTE — Progress Notes (Signed)
  Subjective:    Patient ID: Jessica [name], female    DOB: [birth year], 60 y.o.   MRN: [id number]  HPI Few days of moderate pain at both ears (right > left), and assoc headache, nasal congestion, sore throat, purulent rhinorrhea. no cbg record, but states cbg's are frequently as low as the 50's, at any time of day.  No significant weight change. Past Medical History  Diagnosis Date  . Asthma     childhood  . Hemorrhoids   . Autonomic neuropathy     diabetic  . Hypertension   . Diabetes mellitus     type II  . Anemia, iron deficiency    Past Surgical History  Procedure Date  . Tubal ligation   . Appendectomy   . Left breast cyst removal     reports that she has never smoked. She does not have any smokeless tobacco history on file. She reports that she does not drink alcohol or use illicit drugs. family history includes Cancer in her sister and Diabetes in her other. Allergies  Allergen Reactions  . Nsaids     REACTION: N/V Cannot tolerate in large doses      Review of Systems She has a dry cough, but no fever    Objective:   Physical Exam Gen:  no distress head: no deformity eyes: no periorbital swelling, no proptosis external nose and ears are normal mouth: no lesion seen Tm's and eac's are normal       Assessment & Plan:  Dm, apparently overcontrolled Uri She prob has some element of allergic  rhinits also

## 2010-07-22 NOTE — Patient Instructions (Addendum)
Reduce insulin to 60 units with breakfast, and 40 with the evening meal. i have sent a prescription for an antibiotic to your pharmacy. Take loratadine-d (non-prescription) as needed for congestion.   Please come in for physical as scheduled.

## 2010-08-10 ENCOUNTER — Other Ambulatory Visit (INDEPENDENT_AMBULATORY_CARE_PROVIDER_SITE_OTHER): Payer: 59

## 2010-08-10 DIAGNOSIS — Z0000 Encounter for general adult medical examination without abnormal findings: Secondary | ICD-10-CM

## 2010-08-10 LAB — HEPATIC FUNCTION PANEL
ALT: 20 U/L (ref 0–35)
AST: 18 U/L (ref 0–37)
Albumin: 3.6 g/dL (ref 3.5–5.2)
Alkaline Phosphatase: 57 U/L (ref 39–117)
Bilirubin, Direct: 0.1 mg/dL (ref 0.0–0.3)
Total Bilirubin: 0.6 mg/dL (ref 0.3–1.2)
Total Protein: 6.6 g/dL (ref 6.0–8.3)

## 2010-08-10 LAB — BASIC METABOLIC PANEL
BUN: 15 mg/dL (ref 6–23)
CO2: 30 mEq/L (ref 19–32)
Calcium: 9.1 mg/dL (ref 8.4–10.5)
Chloride: 103 mEq/L (ref 96–112)
Creatinine, Ser: 0.6 mg/dL (ref 0.4–1.2)
GFR: 126.33 mL/min (ref >60.00)
Glucose, Bld: 106 mg/dL — ABNORMAL HIGH (ref 70–99)
Potassium: 4.2 mEq/L (ref 3.5–5.1)
Sodium: 140 mEq/L (ref 135–145)

## 2010-08-10 LAB — URINALYSIS
Bilirubin Urine: NEGATIVE
Ketones, ur: NEGATIVE
Leukocytes, UA: NEGATIVE
Nitrite: NEGATIVE
Specific Gravity, Urine: 1.025 (ref 1.000–1.030)
Total Protein, Urine: NEGATIVE
Urine Glucose: NEGATIVE
Urobilinogen, UA: 0.2 (ref 0.0–1.0)
pH: 6 (ref 5.0–8.0)

## 2010-08-10 LAB — CBC WITH DIFFERENTIAL/PLATELET
Basophils Absolute: 0 10*3/uL (ref 0.0–0.1)
Basophils Relative: 0.6 % (ref 0.0–3.0)
Eosinophils Absolute: 0.1 10*3/uL (ref 0.0–0.7)
Eosinophils Relative: 1.5 % (ref 0.0–5.0)
HCT: 41.5 % (ref 36.0–46.0)
Hemoglobin: 14.3 g/dL (ref 12.0–15.0)
Lymphocytes Relative: 40.3 % (ref 12.0–46.0)
Lymphs Abs: 1.6 10*3/uL (ref 0.7–4.0)
MCHC: 34.4 g/dL (ref 30.0–36.0)
MCV: 95.5 fl (ref 78.0–100.0)
Monocytes Absolute: 0.3 10*3/uL (ref 0.1–1.0)
Monocytes Relative: 7.1 % (ref 3.0–12.0)
Neutro Abs: 2 10*3/uL (ref 1.4–7.7)
Neutrophils Relative %: 50.5 % (ref 43.0–77.0)
Platelets: 269 10*3/uL (ref 150.0–400.0)
RBC: 4.35 Mil/uL (ref 3.87–5.11)
RDW: 13.6 % (ref 11.5–14.6)
WBC: 4 10*3/uL — ABNORMAL LOW (ref 4.5–10.5)

## 2010-08-10 LAB — LIPID PANEL
Cholesterol: 165 mg/dL (ref 0–200)
HDL: 58.8 mg/dL (ref >39.00)
LDL Cholesterol: 97 mg/dL (ref 0–99)
Total CHOL/HDL Ratio: 3
Triglycerides: 45 mg/dL (ref 0.0–149.0)
VLDL: 9 mg/dL (ref 0.0–40.0)

## 2010-08-10 LAB — TSH: TSH: 1.43 u[IU]/mL (ref 0.35–5.50)

## 2010-08-11 ENCOUNTER — Other Ambulatory Visit: Payer: Self-pay | Admitting: Endocrinology

## 2010-08-11 DIAGNOSIS — Z0000 Encounter for general adult medical examination without abnormal findings: Secondary | ICD-10-CM

## 2010-08-17 ENCOUNTER — Other Ambulatory Visit (INDEPENDENT_AMBULATORY_CARE_PROVIDER_SITE_OTHER): Payer: 59

## 2010-08-17 ENCOUNTER — Encounter: Payer: Self-pay | Admitting: Endocrinology

## 2010-08-17 ENCOUNTER — Ambulatory Visit (INDEPENDENT_AMBULATORY_CARE_PROVIDER_SITE_OTHER): Payer: 59 | Admitting: Endocrinology

## 2010-08-17 VITALS — BP 114/66 | HR 72 | Temp 98.4°F | Ht 65.0 in | Wt 253.1 lb

## 2010-08-17 DIAGNOSIS — M722 Plantar fascial fibromatosis: Secondary | ICD-10-CM

## 2010-08-17 DIAGNOSIS — E119 Type 2 diabetes mellitus without complications: Secondary | ICD-10-CM

## 2010-08-17 DIAGNOSIS — Z23 Encounter for immunization: Secondary | ICD-10-CM

## 2010-08-17 DIAGNOSIS — Z0000 Encounter for general adult medical examination without abnormal findings: Secondary | ICD-10-CM

## 2010-08-17 LAB — HEMOGLOBIN A1C: Hgb A1c MFr Bld: 8.2 % — ABNORMAL HIGH (ref 4.6–6.5)

## 2010-08-17 MED ORDER — ATORVASTATIN CALCIUM 40 MG PO TABS: 40.0000 mg | ORAL_TABLET | Freq: Every day | ORAL | Status: DC

## 2010-08-17 NOTE — Progress Notes (Signed)
Subjective:    Patient ID: Jessica [name], female    DOB: 1950/10/30, 60 y.o.   MRN: [id number]  HPI here for regular wellness examination.  she's feeling pretty well in general, and says chronic med probs are stable, except as noted below.  Gyn is dr Ouida Sills.  Past Medical History  Diagnosis Date  . Asthma     childhood  . Hemorrhoids   . Autonomic neuropathy     diabetic  . Hypertension   . Diabetes mellitus     type II  . Anemia, iron deficiency    Past Surgical History  Procedure Date  . Tubal ligation   . Appendectomy   . Left breast cyst removal     reports that she has never smoked. She does not have any smokeless tobacco history on file. She reports that she does not drink alcohol or use illicit drugs. Married retired family history includes Cancer in her sister and Diabetes in her other. Allergies  Allergen Reactions  . Nsaids     REACTION: N/V Cannot tolerate in large doses      Review of Systems  Constitutional: Negative for fever and unexpected weight change.  HENT: Negative for hearing loss.   Eyes: Negative for visual disturbance.  Respiratory: Negative for shortness of breath.   Cardiovascular: Negative for chest pain.  Gastrointestinal: Negative for anal bleeding.  Genitourinary: Negative for hematuria.  Musculoskeletal:       No change in chronic low-back pain  Skin: Negative for rash.  Neurological: Negative for headaches.  Hematological: Bruises/bleeds easily.  Psychiatric/Behavioral: Negative for dysphoric mood. The patient is not nervous/anxious.        Objective:   Physical Exam VS: see vs page GEN: no distress.  obese. HEAD: head: no deformity eyes: no periorbital swelling, no proptosis external nose and ears are normal mouth: no lesion seen NECK: supple, thyroid is not enlarged CHEST WALL: no deformity BREASTS:  sees gyn CV: reg rate and rhythm, no murmur ABD: abdomen is soft, nontender.  no hepatosplenomegaly.  not  distended.  no hernia. GENITALIA:  sees gyn RECTAL: sees gyn MUSCULOSKELETAL: muscle bulk and strength are grossly normal.  no obvious joint swelling.  gait is normal and steady NEURO:  cn 2-12 grossly intact.   readily moves all 4's.   SKIN:  Normal texture and temperature.  No rash or suspicious lesion is visible.   NODES:  None palpable at the neck. PSYCH: alert, oriented x3.  Does not appear anxious nor depressed.      SEPARATE EVALUATION FOLLOWS--EACH PROBLEM HERE IS NEW, NOT RESPONDING TO TREATMENT, OR POSES SIGNIFICANT RISK TO THE PATIENT'S HEALTH: HISTORY OF THE PRESENT ILLNESS: no cbg record, but states cbg's are frequently mildly low at any time of day.  Denies loc She takes pravachol as rx'ed.  She tolerates well. PAST MEDICAL HISTORY reviewed and up to date today REVIEW OF SYSTEMS: Denies loc PHYSICAL EXAMINATION: GENERAL: no distress Pulses: dorsalis pedis intact bilat.   Feet: no deformity.  no ulcer on the feet.  feet are of normal color and temp.  There is trace bilat leg edema Neuro: sensation is intact to touch on the feet  LAB/XRAY RESULTS: Lab Results  Component Value Date   WBC 4.0* 08/10/2010   HGB 14.3 08/10/2010   HCT 41.5 08/10/2010   PLT 269.0 08/10/2010   CHOL 165 08/10/2010   TRIG 45.0 08/10/2010   HDL 58.80 08/10/2010   LDLDIRECT 143.2 11/23/2006   ALT 20 08/10/2010  AST 18 08/10/2010   NA 140 08/10/2010   K 4.2 08/10/2010   CL 103 08/10/2010   CREATININE 0.6 08/10/2010   BUN 15 08/10/2010   CO2 30 08/10/2010   TSH 1.43 08/10/2010   HGBA1C 8.2* 08/17/2010   MICROALBUR 0.9 01/14/2009   IMPRESSION: Dm, needs increased rx Dyslipidemia, needs increased rx PLAN: See instruction page   Assessment & Plan:  Wellness visit, with probs stable except as noted

## 2010-08-17 NOTE — Patient Instructions (Addendum)
Change pravastatin to lipitor, 40 mg at night. blood tests are being ordered for you today.  please call [phone] to hear your test results. pending the test results, please reduce insulin to 55 units with breakfast, and 35 with evening meal. please consider these measures for your health:  minimize alcohol.  do not use tobacco products.  have a colonoscopy at least every 10 years from age 60.  keep firearms safely stored.  always use seat belts.  have working smoke alarms in your home.  see an eye doctor and dentist regularly.  never drive under the influence of alcohol or drugs (including prescription drugs).   please let me know what your wishes would be, if artificial life support measures should become necessary.  it is critically important to prevent falling down (keep floor areas well-lit, dry, and free of loose objects) Please make a follow-up appointment in 3 months. Refer to a dietician.  you will be called with a day and time for an appointment. (update: i left message on phone-tree:  rx as we discussed)

## 2010-08-23 ENCOUNTER — Telehealth: Payer: Self-pay

## 2010-08-23 NOTE — Telephone Encounter (Signed)
Pt called with questions regarding recent A1C of 8.2%. Pt states that for the last 3 months she has been having mostly low blood sugars and cannot understand why her A1C was so high. Pt is requesting advisement of possible explanations from MD.

## 2010-08-23 NOTE — Telephone Encounter (Signed)
Correction:  check your blood sugar 2 times a day.  vary the time of day when you check, between before the 3 meals, and at bedtime.  also check if you have symptoms of your blood sugar being too high or too low.  please keep a record of the readings and bring it to your next appointment here.  please call us sooner if you are having low blood sugar episodes. The answer to this question lis in the pattern of your blood sugar.

## 2010-08-23 NOTE — Telephone Encounter (Signed)
Everybody's needs are different, especially in diabetes.  Please consider the qd insulin, and let me know.

## 2010-08-23 NOTE — Telephone Encounter (Signed)
Left message on machine for pt to return my call  

## 2010-08-24 NOTE — Telephone Encounter (Signed)
Per spouse pt advised, no documentation in EPIC, information verified with spouse.

## 2010-08-31 ENCOUNTER — Other Ambulatory Visit: Payer: Self-pay | Admitting: Gastroenterology

## 2010-09-13 NOTE — Assessment & Plan Note (Signed)
[facility] OFFICE NOTE   NAME:[name], [name]                      MRN:          [id number]  DATE:02/14/2007                            DOB:          Oct 06, 1950    The patient is a 60 year old African-American female with chronic  thyroid dysfunction.  There are known thyroid cysts.  She has recently  had recurrent solid food dysphagia and has a long history of acid reflux  and is not on PPI therapy, but takes metoclopramide 5 mg four times a  day for diabetic gastroparesis.  She has had endoscopic examination in  many years.  She is up-to-date on her colonoscopy with her last  examination in August of 2006.   In addition to her gastroparesis with her diabetes, she has peripheral  neuropathy and is on Neurontin 300 mg twice a day.  She also takes  Levemir [insulin] daily, Diovan 320 mg a day, Zocor 80 mg a day, and  Humalog Insulin.  She uses p.r.n. Lasix.  She is followed by Dr. Loanne Drilling  for her general medical problems and diabetic care.  She has normal  renal function and has recently been evaluated for non-neoplastic  goiter.   She denies lower GI or hepatobiliary complaints.  She is following a  fairly regular diet.  She has rather marked diarrhea as a side effect of  her Reglan and she says she cannot go out to eat.  She has had no rectal  bleeding, or any hepatobiliary complaints.   PHYSICAL EXAMINATION:  GENERAL:  She is a healthy-appearing black female  in no acute distress, appearing her stated age.  VITAL SIGNS:  She is 5 feet 5 inches tall and her weight is not  recorded.  Blood pressure 124/80, pulse 76 and regular.  NECK:  Unremarkable except for mild thyromegaly without any definite  masses or tenderness.  Otherwise unremarkable.   ASSESSMENT:  1. It sounds like the patient is having acid reflux and is not on acid      suppressive therapy.  2. 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  3. Mild diabetic gastroparesis with rather severe diarrhea from      Reglan.  4. Thyroid goiter being managed by Dr. Loanne Drilling.  5. Insulin-dependent diabetes mellitus, history of peripheral      neuropathy.   RECOMMENDATIONS:  1. We will try to stop her Reglan and do a step-3 gastroparesis diet      and see how she does symptomatically.  2. Begin Nexium 40 mg 30 minutes before the first meal of the day.  3. Outpatient endoscopy and possible dilatation.  4. Continue other medications per Dr. Loanne Drilling.     Loralee Pacas. Sharlett Iles, MD, [place] [place], [place]  Electronically Signed    DRP/MedQ  DD: 02/14/2007  DT: 02/14/2007  Job #: [id number]   cc:   Hilliard Clark A. Loanne Drilling, MD

## 2010-09-23 ENCOUNTER — Ambulatory Visit (INDEPENDENT_AMBULATORY_CARE_PROVIDER_SITE_OTHER): Payer: 59 | Admitting: Gastroenterology

## 2010-09-23 ENCOUNTER — Encounter: Payer: Self-pay | Admitting: Gastroenterology

## 2010-09-23 VITALS — BP 144/78 | HR 88 | Ht 65.0 in | Wt 253.4 lb

## 2010-09-23 DIAGNOSIS — R1013 Epigastric pain: Secondary | ICD-10-CM

## 2010-09-23 DIAGNOSIS — K3189 Other diseases of stomach and duodenum: Secondary | ICD-10-CM

## 2010-09-23 DIAGNOSIS — Z794 Long term (current) use of insulin: Secondary | ICD-10-CM | POA: Insufficient documentation

## 2010-09-23 DIAGNOSIS — Z860100 Personal history of colon polyps, unspecified: Secondary | ICD-10-CM | POA: Insufficient documentation

## 2010-09-23 DIAGNOSIS — Z800 Family history of malignant neoplasm of digestive organs: Secondary | ICD-10-CM

## 2010-09-23 DIAGNOSIS — K5901 Slow transit constipation: Secondary | ICD-10-CM | POA: Insufficient documentation

## 2010-09-23 DIAGNOSIS — E118 Type 2 diabetes mellitus with unspecified complications: Secondary | ICD-10-CM | POA: Insufficient documentation

## 2010-09-23 DIAGNOSIS — E119 Type 2 diabetes mellitus without complications: Secondary | ICD-10-CM

## 2010-09-23 MED ORDER — POLYETHYLENE GLYCOL 3350 17 GM/SCOOP PO POWD: ORAL | Status: DC

## 2010-09-23 MED ORDER — RABEPRAZOLE SODIUM 20 MG PO TBEC: 20.0000 mg | DELAYED_RELEASE_TABLET | Freq: Every day | ORAL | Status: DC

## 2010-09-23 NOTE — Progress Notes (Signed)
This is a 60 year old African American female with insulin-dependent diabetes and chronic functional constipation currently doing well on nightly MiraLax 8 ounces. She is a very strong family history of colon cancer in several relatives including her sister. Her last colonoscopy was in November of 2011 at which time multiple adenomatous polyps were removed. Her diabetes is doing fairly well, she denies other gastrointestinal issues except for dyspepsia in the last several weeks related to regular ibuprofen use for degenerative arthritis.  Current Medications, Allergies, Past Medical History, Past Surgical History, Family History and Social History were reviewed in [employer] record.  Pertinent Review of Systems Negative.Marland Kitchen osteoarthritis with limited mobility. Has appointment to see a dietitian this week. I have also counseled her about water aerobics exercise programs.   Physical Exam: Awake alert no acute distress appearing her stated age. I cannot appreciate thyromegaly or stigmata of chronic liver disease. Her chest is clear and there are no murmurs gallops or rubs noted. I cannot appreciate hepatosplenomegaly, abdominal masses or tenderness. Bowel sounds are normal. Mental status is clear.    Assessment and Plan: Chronic functional constipation doing well all a high fiber diet, liberal by mouth fluids, nightly MiraLax. She has recurrent colon polyps and a strong family history of colon cancer.IFOB trochars will be done this fall with colonoscopy every 2-3 years. I have placed her on AcipHex 20 mg a day because of her dyspepsia and NSAID use. Vertebrae keep her appointment with the dietitian, also to establish a graduated aerobic exercise program. She sees Dr. Loanne Drilling for her diabetic management. No diagnosis found. Please copy her primary care physician, referring physician, and pertinent subspecialists.

## 2010-09-23 NOTE — Patient Instructions (Signed)
Your prescription(s) have been sent to you pharmacy.  I will call you in November to remind you to complete a iFob test in our basement.

## 2010-10-05 ENCOUNTER — Other Ambulatory Visit: Payer: Self-pay | Admitting: Endocrinology

## 2010-10-15 ENCOUNTER — Other Ambulatory Visit: Payer: Self-pay | Admitting: Endocrinology

## 2010-10-17 ENCOUNTER — Other Ambulatory Visit: Payer: Self-pay | Admitting: Endocrinology

## 2010-11-13 ENCOUNTER — Other Ambulatory Visit: Payer: Self-pay | Admitting: Internal Medicine

## 2010-11-17 ENCOUNTER — Ambulatory Visit: Payer: 59 | Admitting: Endocrinology

## 2010-11-28 ENCOUNTER — Other Ambulatory Visit: Payer: Self-pay | Admitting: Gastroenterology

## 2010-11-29 ENCOUNTER — Other Ambulatory Visit (INDEPENDENT_AMBULATORY_CARE_PROVIDER_SITE_OTHER): Payer: 59

## 2010-11-29 ENCOUNTER — Ambulatory Visit (INDEPENDENT_AMBULATORY_CARE_PROVIDER_SITE_OTHER): Payer: 59 | Admitting: Endocrinology

## 2010-11-29 ENCOUNTER — Encounter: Payer: Self-pay | Admitting: Endocrinology

## 2010-11-29 DIAGNOSIS — E119 Type 2 diabetes mellitus without complications: Secondary | ICD-10-CM

## 2010-11-29 DIAGNOSIS — E7800 Pure hypercholesterolemia, unspecified: Secondary | ICD-10-CM

## 2010-11-29 LAB — HEMOGLOBIN A1C: Hgb A1c MFr Bld: 10.2 % — ABNORMAL HIGH (ref 4.6–6.5)

## 2010-11-29 LAB — LIPID PANEL
Cholesterol: 146 mg/dL (ref 0–200)
HDL: 53.7 mg/dL (ref >39.00)
LDL Cholesterol: 82 mg/dL (ref 0–99)
Total CHOL/HDL Ratio: 3
Triglycerides: 51 mg/dL (ref 0.0–149.0)
VLDL: 10.2 mg/dL (ref 0.0–40.0)

## 2010-11-29 MED ORDER — GLUCOSE BLOOD VI STRP: ORAL_STRIP | Status: AC

## 2010-11-29 NOTE — Progress Notes (Signed)
Subjective:    Patient ID: Jessica [name], female    DOB: 07/06/1950, 60 y.o.   MRN: [id number]  HPI no cbg record, but states cbg's vary from 80-150.  It is in general higher later in the day.  pt states she feels well in general. Past Medical History  Diagnosis Date  . Asthma     childhood  . Hemorrhoids   . Autonomic neuropathy     diabetic  . Hypertension   . Diabetes mellitus     type II  . Anemia, iron deficiency   . Gastroparesis   . Unspecified gastritis and gastroduodenitis without mention of hemorrhage   . Personal history of colonic polyps 03/2010    hyperplastic    Past Surgical History  Procedure Date  . Tubal ligation   . Appendectomy   . Left breast cyst removal     History   Social History  . Marital Status: Married    Spouse Name: N/A    Number of Children: N/A  . Years of Education: N/A   Occupational History  . works for school system    Social History Main Topics  . Smoking status: Never Smoker   . Smokeless tobacco: Not on file  . Alcohol Use: No  . Drug Use: No  . Sexually Active: Not on file   Other Topics Concern  . Not on file   Social History Narrative  . No narrative on file    Current Outpatient Prescriptions on File Prior to Visit  Medication Sig Dispense Refill  . ACIPHEX 20 MG tablet TAKE 1 TABLET BY MOUTH EVERY DAY  30 tablet  1  . atorvastatin (LIPITOR) 40 MG tablet Take 1 tablet (40 mg total) by mouth daily.  30 tablet  11  . B-D ULTRAFINE III SHORT PEN 31G X 8 MM MISC USE 4 TIMES DAILY AS DIRECTED  100 each  5  . DIOVAN 320 MG tablet TAKE 1 TABLET EVERY DAY  30 tablet  8  . gabapentin (NEURONTIN) 300 MG capsule TAKE ONE CAPSULE BY MOUTH TWICE A DAY  60 capsule  9  . glucose blood (ONE TOUCH TEST STRIPS) test strip 1 each by Other route. Use as directed 250.01       . ibuprofen (ADVIL,MOTRIN) 100 MG tablet Take 100 mg by mouth. As needed for back and knee pain       . insulin lispro protamine-insulin lispro (HUMALOG  MIX 75/25 KWIKPEN) (75-25) 100 UNIT/ML SUSP Use 60 units with breakfast and 40 units with evening meal  30 Syringe  9  . polyethylene glycol powder (GLYCOLAX/MIRALAX) powder Use one scoop as directed by MD 1-2 times a day  527 g  6    Allergies  Allergen Reactions  . Nsaids     REACTION: N/V Cannot tolerate in large doses     Family History  Problem Relation Age of Onset  . Colon cancer Sister   . Diabetes Sister   . Diabetes Brother   . Heart disease Father     BP 128/72  Pulse 76  Temp(Src) 98.3 F (36.8 C) (Oral)  Ht 5' 6"  (1.676 m)  Wt 253 lb 12.8 oz (115.123 kg)  BMI 40.96 kg/m2  SpO2 96%    Review of Systems denies hypoglycemia.    Objective:   Physical Exam Pulses: dorsalis pedis intact bilat.   Feet: no deformity.  no ulcer on the feet.  feet are of normal color and temp.  no edema  Neuro: sensation is intact to touch on the feet  Lab Results  Component Value Date   HGBA1C 10.2* 11/29/2010      Assessment & Plan:  Dm, therapy limited by pt's need for a simple regimen.  needs increased rx

## 2010-11-29 NOTE — Patient Instructions (Addendum)
blood tests are being ordered for you today.  please call [phone] to hear your test results.  You will be prompted to enter the 9-digit "MRN" number that appears at the top left of this page, followed by #.  Then you will hear the message. pending the test results, please increase insulin to 70 units with breakfast, and 30 with evening meal. Please make a follow-up appointment in 3 months (update: i left message on phone-tree:  Take 80 units am. Ret 6 weeks).

## 2011-01-09 ENCOUNTER — Encounter: Payer: Self-pay | Admitting: Endocrinology

## 2011-01-23 ENCOUNTER — Other Ambulatory Visit: Payer: Self-pay | Admitting: Gastroenterology

## 2011-02-28 ENCOUNTER — Encounter: Payer: Self-pay | Admitting: Endocrinology

## 2011-02-28 ENCOUNTER — Other Ambulatory Visit (INDEPENDENT_AMBULATORY_CARE_PROVIDER_SITE_OTHER): Payer: 59

## 2011-02-28 ENCOUNTER — Other Ambulatory Visit: Payer: Self-pay | Admitting: Endocrinology

## 2011-02-28 ENCOUNTER — Other Ambulatory Visit: Payer: 59

## 2011-02-28 ENCOUNTER — Ambulatory Visit (INDEPENDENT_AMBULATORY_CARE_PROVIDER_SITE_OTHER): Payer: 59 | Admitting: Endocrinology

## 2011-02-28 VITALS — BP 124/78 | HR 64 | Temp 97.4°F | Resp 14 | Wt 253.1 lb

## 2011-02-28 DIAGNOSIS — G4730 Sleep apnea, unspecified: Secondary | ICD-10-CM

## 2011-02-28 DIAGNOSIS — R21 Rash and other nonspecific skin eruption: Secondary | ICD-10-CM | POA: Insufficient documentation

## 2011-02-28 DIAGNOSIS — E119 Type 2 diabetes mellitus without complications: Secondary | ICD-10-CM

## 2011-02-28 DIAGNOSIS — G4733 Obstructive sleep apnea (adult) (pediatric): Secondary | ICD-10-CM | POA: Insufficient documentation

## 2011-02-28 DIAGNOSIS — L919 Hypertrophic disorder of the skin, unspecified: Secondary | ICD-10-CM

## 2011-02-28 DIAGNOSIS — G478 Other sleep disorders: Secondary | ICD-10-CM

## 2011-02-28 DIAGNOSIS — L909 Atrophic disorder of skin, unspecified: Secondary | ICD-10-CM

## 2011-02-28 LAB — HEMOGLOBIN A1C: Hgb A1c MFr Bld: 9.6 % — ABNORMAL HIGH (ref 4.6–6.5)

## 2011-02-28 MED ORDER — VALACYCLOVIR HCL 1 G PO TABS: 1000.0000 mg | ORAL_TABLET | Freq: Two times a day (BID) | ORAL | Status: DC

## 2011-02-28 NOTE — Patient Instructions (Addendum)
i have sent a prescription to your pharmacy, for a pill against the rash A diabetes blood test is being requested for you today.  please call [phone] to hear your test results.  You will be prompted to enter the 9-digit "MRN" number that appears at the top left of this page, followed by #.  Then you will hear the message. i have also sent a culture.  In a few days, please call [phone] to hear your test results.  You will be prompted to enter the 9-digit "MRN" number that appears at the top left of this page, followed by #.  Then you will hear the message. Refer to a sleep specialist.  you will receive a phone call, about a day and time for an appointment. Please come back for a follow-up appointment in 3 months pending the test results, please change insulin to 50 units 2x a day (with first and last meals of the day).   (update: i left message on phone-tree:  After you are done with rx dosage of valtrex, start chronic suppression.  i have sent a prescription to your pharmacy.)

## 2011-02-28 NOTE — Progress Notes (Signed)
Subjective:    Patient ID: Jessica [name], female    DOB: 04-29-1951, 60 y.o.   MRN: [id number]  HPI Pt states few days of slight "blister" at the left buttock, and assoc pain.  This has been recurrent x 1-2 years. no cbg record, but states cbg's are 200 in am, and 80 in the afternoon.  She takes 60 units of insulin am and 40 pm Past Medical History  Diagnosis Date  . Asthma     childhood  . Hemorrhoids   . Autonomic neuropathy     diabetic  . Hypertension   . Diabetes mellitus     type II  . Anemia, iron deficiency   . Gastroparesis   . Unspecified gastritis and gastroduodenitis without mention of hemorrhage   . Personal history of colonic polyps 03/2010    hyperplastic    Past Surgical History  Procedure Date  . Tubal ligation   . Appendectomy   . Left breast cyst removal     History   Social History  . Marital Status: Married    Spouse Name: N/A    Number of Children: N/A  . Years of Education: N/A   Occupational History  . works for school system    Social History Main Topics  . Smoking status: Never Smoker   . Smokeless tobacco: Not on file  . Alcohol Use: No  . Drug Use: No  . Sexually Active: Not on file   Other Topics Concern  . Not on file   Social History Narrative  . No narrative on file    Current Outpatient Prescriptions on File Prior to Visit  Medication Sig Dispense Refill  . ACIPHEX 20 MG tablet TAKE 1 TABLET BY MOUTH EVERY DAY  30 tablet  1  . atorvastatin (LIPITOR) 40 MG tablet Take 1 tablet (40 mg total) by mouth daily.  30 tablet  11  . B-D ULTRAFINE III SHORT PEN 31G X 8 MM MISC USE 4 TIMES DAILY AS DIRECTED  100 each  5  . DIOVAN 320 MG tablet TAKE 1 TABLET EVERY DAY  30 tablet  8  . gabapentin (NEURONTIN) 300 MG capsule TAKE ONE CAPSULE BY MOUTH TWICE A DAY  60 capsule  9  . glucose blood (ONETOUCH VERIO IQ) test strip 2x a day, and lancets 250.01  100 each  12  . ibuprofen (ADVIL,MOTRIN) 100 MG tablet Take 100 mg by mouth. As  needed for back and knee pain       . polyethylene glycol powder (GLYCOLAX/MIRALAX) powder Use one scoop as directed by MD 1-2 times a day  527 g  6    Allergies  Allergen Reactions  . Nsaids     REACTION: N/V Cannot tolerate in large doses     Family History  Problem Relation Age of Onset  . Colon cancer Sister   . Diabetes Sister   . Diabetes Brother   . Heart disease Father     BP 124/78  Pulse 64  Temp(Src) 97.4 F (36.3 C) (Oral)  Resp 14  Wt 253 lb 2 oz (114.817 kg)  SpO2 98%  Review of Systems Denies fever.  She has few years of awakening with sob.  She says husb tells her of this.     Objective:   Physical Exam VITAL SIGNS:  See vs page GENERAL: no distress Left buttock: 2 cm cluster of vesicles.   Skin: multiple skin tags   HSV: pos    Assessment &  Plan:  DM, therapy limited by noncompliance.  i'll do the best i can. 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   New problem Sleep disorder, uncertain etiology  Procedure: 3 skin tags are removed from neck, and 2 from the left lateral chest.

## 2011-03-02 LAB — HERPES SIMPLEX VIRUS CULTURE: Organism ID, Bacteria: DETECTED

## 2011-03-02 MED ORDER — VALACYCLOVIR HCL 500 MG PO TABS: 500.0000 mg | ORAL_TABLET | Freq: Two times a day (BID) | ORAL | Status: DC

## 2011-03-24 ENCOUNTER — Other Ambulatory Visit: Payer: Self-pay | Admitting: Gastroenterology

## 2011-03-29 ENCOUNTER — Ambulatory Visit (INDEPENDENT_AMBULATORY_CARE_PROVIDER_SITE_OTHER): Payer: 59 | Admitting: Pulmonary Disease

## 2011-03-29 ENCOUNTER — Encounter: Payer: Self-pay | Admitting: Pulmonary Disease

## 2011-03-29 VITALS — BP 136/80 | HR 68 | Temp 98.1°F | Ht 65.0 in | Wt 255.6 lb

## 2011-03-29 DIAGNOSIS — G4730 Sleep apnea, unspecified: Secondary | ICD-10-CM

## 2011-03-29 DIAGNOSIS — G478 Other sleep disorders: Secondary | ICD-10-CM

## 2011-03-29 NOTE — Progress Notes (Signed)
Subjective:    Patient ID: Jessica [name], female    DOB: 08/11/1950, 60 y.o.   MRN: [id number]  HPI The patient is a 60 year old female who I've been asked to see for possible obstructive sleep apnea.  The patient has had issues with her sleep for years, and tells me that she had a sleep study approximately 5 years ago where she only slept 15 minutes the entire night.  She has been noted to have loud snoring, as well as an abnormal breathing pattern during sleep according to her family.  She also admits to choking arousals at times.  The patient has moderate numbers of awakenings during the night, and is not rested in the mornings upon arising.  She denies a work his issues during the day, but admits that she stays very active.  She does state that she can doze in the chair at night while watching TV or movies.  She will typically try to stay moving to keep from getting sleepy.  She denies significant sleepiness while driving, although she can have issues on rare occasions with long distances.  The patient states that her weight is unchanged over the last 2 years, and her Epworth score today is 6.  Sleep Questionnaire: What time do you typically go to bed?( Between what hours) 10 to 11 How long does it take you to fall asleep? 20 to 30 mins depending on how tired i am How many times during the night do you wake up? 3 What time do you get out of bed to start your day? 0900 Do you drive or operate heavy machinery in your occupation? No How much has your weight changed (up or down) over the past two years? (In pounds) 0 oz (0 kg) Have you ever had a sleep study before? Yes If yes, location of study? cannot remember the location. If yes, date of study? approx 5 years ago Do you currently use CPAP? No Do you wear oxygen at any time? No     Review of Systems  Constitutional: Negative for fever and unexpected weight change.  HENT: Positive for congestion and trouble swallowing. Negative for ear pain,  nosebleeds, sore throat, rhinorrhea, sneezing, dental problem, postnasal drip and sinus pressure.   Eyes: Negative for redness and itching.  Respiratory: Positive for shortness of breath. Negative for cough, chest tightness and wheezing.   Cardiovascular: Negative for palpitations and leg swelling.  Gastrointestinal: Negative for nausea and vomiting.  Genitourinary: Negative for dysuria.  Musculoskeletal: Positive for joint swelling.  Skin: Negative for rash.  Neurological: Negative for headaches.  Hematological: Does not bruise/bleed easily.  Psychiatric/Behavioral: Negative for dysphoric mood. The patient is nervous/anxious.        Objective:   Physical Exam Constitutional: obese female, no acute distress  HENT:  Nares patent without discharge, some turbinate hypertrophy  Oropharynx without exudate, palate and uvula are thickened and elongated.  Small posterior pharyngeal space.   Eyes:  Perrla, eomi, no scleral icterus  Neck:  No JVD, no TMG  Cardiovascular:  Normal rate, regular rhythm, no rubs or gallops.  No murmurs        Intact distal pulses  Pulmonary :  Normal breath sounds, no stridor or respiratory distress   No rales, rhonchi, or wheezing  Abdominal:  Soft, nondistended, bowel sounds present.  No tenderness noted.   Musculoskeletal:  No lower extremity edema noted.  Lymph Nodes:  No cervical lymphadenopathy noted  Skin:  No cyanosis noted  Neurologic:  Alert, appropriate, moves all 4 extremities without obvious deficit.         Assessment & Plan:

## 2011-03-29 NOTE — Patient Instructions (Signed)
Will order sleep study, and arrange followup with me afterwards to discuss Have provided a sample of lunesta 45m.  If you have difficulty falling asleep during the study, please take this medication.

## 2011-03-29 NOTE — Assessment & Plan Note (Signed)
The patient's history is very suggestive of clinically significant sleep disordered breathing.  She also has underlying hypertension and diabetes, both of which can be affected by sleep apnea.  I have had a long discussion with the pt about sleep apnea, including its impact on QOL and CV health.  I think the patient needs a sleep study for diagnosis, and she is willing to do this.  Because of her  sleep onset problems at her last sleep study, we'll provide her with a sample of Lunesta to take if she is unable to initiate sleep.  I have also encouraged her to work aggressively on weight loss.

## 2011-04-12 ENCOUNTER — Ambulatory Visit (HOSPITAL_BASED_OUTPATIENT_CLINIC_OR_DEPARTMENT_OTHER): Payer: 59 | Attending: Pulmonary Disease | Admitting: Radiology

## 2011-04-12 VITALS — Ht 65.0 in | Wt 245.0 lb

## 2011-04-12 DIAGNOSIS — G4730 Sleep apnea, unspecified: Secondary | ICD-10-CM

## 2011-04-12 DIAGNOSIS — G4733 Obstructive sleep apnea (adult) (pediatric): Secondary | ICD-10-CM

## 2011-04-12 DIAGNOSIS — G4710 Hypersomnia, unspecified: Secondary | ICD-10-CM | POA: Insufficient documentation

## 2011-04-29 ENCOUNTER — Other Ambulatory Visit: Payer: Self-pay | Admitting: Gastroenterology

## 2011-04-30 DIAGNOSIS — G4730 Sleep apnea, unspecified: Secondary | ICD-10-CM

## 2011-04-30 NOTE — Procedures (Signed)
[name], Jessica [name]               ACCOUNT NO.:  [id number]  MEDICAL RECORD NO.:  [id number]          PATIENT TYPE:  OUT  LOCATION:  SLEEP CENTER                 FACILITY:  [facility]  PHYSICIAN:  Kathee Delton, MD,FCCPDATE OF BIRTH:  [birth year]05[birth year]50  DATE OF STUDY:  04/12/2011                           NOCTURNAL POLYSOMNOGRAM  REFERRING PHYSICIAN:  Kathee Delton, MD,FCCP  LOCATION:  Sleep Lab.  REFERRING PHYSICIAN:  Kathee Delton, MD, FCCP  INDICATION FOR STUDY:  Hypersomnia with sleep apnea.  EPWORTH SLEEPINESS SCORE:  8.  MEDICATIONS:  SLEEP ARCHITECTURE:  The patient had a total sleep time of 292 minutes with no slow-wave sleep and only 50 minutes of REM.  Sleep onset latency was mildly prolonged at 35 minutes, and REM onset was normal at 81 minute.  Sleep efficiency was moderately decreased at 80%.  RESPIRATORY DATA:  The patient was found to have 1 apnea and 13 obstructive hypopneas, giving her an apnea-hypopnea index of only 3 events per hour.  These occurred exclusively in supine REM.  There was moderate snoring noted throughout.  OXYGEN DATA:  There was O2 desaturation as low as 91% with the patient's obstructive events.  CARDIAC DATA:  Rare PVC noted, but no clinically significant arrhythmias were seen.  MOVEMENT-PARASOMNIA:  The patient had no significant leg jerks or other abnormal behaviors noted.  IMPRESSIONS-RECOMMENDATIONS: 1. Small numbers of obstructive events, which do not meet the AHI     criteria of the obstructive sleep apnea     syndrome.  The patient should be encouraged to work aggressively on     weight loss. 2. Rare premature ventricular contraction noted, but no clinically     significant arrhythmias were seen.     Kathee Delton, MD,FCCP [place], [place] Board of Sleep Medicine Electronically Signed    [facility]/MED[facility]  D:  04/30/2011 14:42:00  T:  04/30/2011 23:37:01  Job:  [id number]

## 2011-05-04 ENCOUNTER — Telehealth: Payer: Self-pay | Admitting: Pulmonary Disease

## 2011-05-04 NOTE — Telephone Encounter (Signed)
Pt stated she was returning [facility] call & asked to be reached at [phone].  Jessica [name]

## 2011-05-04 NOTE — Telephone Encounter (Signed)
Megan, I have tried to call this pt and got voice mail on cell phone.  LM at home with someone.   pLease let her know that her sleep study does not show sleep apnea that is considered abnormal.  She does have some snoring.  Would recommend that she work hard on weight loss, and try to stay off her back while sleeping.

## 2011-05-04 NOTE — Telephone Encounter (Signed)
I spoke with pt and is aware of KC's recs regarding sleep study. She voiced her understanding and had no questions. Will sign off message

## 2011-06-01 ENCOUNTER — Ambulatory Visit: Payer: 59 | Admitting: Endocrinology

## 2011-06-06 ENCOUNTER — Ambulatory Visit: Payer: 59 | Admitting: Endocrinology

## 2011-06-13 ENCOUNTER — Ambulatory Visit: Payer: 59 | Admitting: Endocrinology

## 2011-07-25 ENCOUNTER — Other Ambulatory Visit: Payer: Self-pay | Admitting: Endocrinology

## 2011-08-22 ENCOUNTER — Other Ambulatory Visit: Payer: Self-pay | Admitting: Endocrinology

## 2011-09-01 ENCOUNTER — Other Ambulatory Visit: Payer: Self-pay | Admitting: Endocrinology

## 2011-09-08 ENCOUNTER — Other Ambulatory Visit: Payer: Self-pay | Admitting: Endocrinology

## 2011-09-27 ENCOUNTER — Other Ambulatory Visit: Payer: Self-pay | Admitting: Obstetrics and Gynecology

## 2011-10-02 ENCOUNTER — Encounter (HOSPITAL_COMMUNITY): Payer: Self-pay | Admitting: Pharmacist

## 2011-10-05 ENCOUNTER — Other Ambulatory Visit (INDEPENDENT_AMBULATORY_CARE_PROVIDER_SITE_OTHER): Payer: 59

## 2011-10-05 ENCOUNTER — Ambulatory Visit (INDEPENDENT_AMBULATORY_CARE_PROVIDER_SITE_OTHER): Payer: 59 | Admitting: Endocrinology

## 2011-10-05 ENCOUNTER — Encounter: Payer: Self-pay | Admitting: Endocrinology

## 2011-10-05 VITALS — BP 130/80 | HR 68 | Temp 97.4°F | Ht 65.0 in | Wt 247.1 lb

## 2011-10-05 DIAGNOSIS — R079 Chest pain, unspecified: Secondary | ICD-10-CM

## 2011-10-05 DIAGNOSIS — E119 Type 2 diabetes mellitus without complications: Secondary | ICD-10-CM

## 2011-10-05 LAB — HEMOGLOBIN A1C: Hgb A1c MFr Bld: 9.9 % — ABNORMAL HIGH (ref 4.6–6.5)

## 2011-10-05 MED ORDER — CEFUROXIME AXETIL 250 MG PO TABS: 250.0000 mg | ORAL_TABLET | Freq: Two times a day (BID) | ORAL | Status: AC

## 2011-10-05 NOTE — Progress Notes (Signed)
Subjective:    Patient ID: Jessica [name], female    DOB: 07-15-1950, 61 y.o.   MRN: [id number]  HPI Pt returns for f/u of IDDM (dx'ed; complicated by autonomic neuropathy).  She does not check cbg's.   Pt states few days of slight prod-quality cough in the chest, and assoc sore throat. Pt also reports intermittent left anterior chest pain, non-exertional, lasting 1-2 minutes.  No assoc sxs.   Past Medical History  Diagnosis Date  . Asthma     childhood  . Hemorrhoids   . Autonomic neuropathy     diabetic  . Hypertension   . Gastroparesis   . Unspecified gastritis and gastroduodenitis without mention of hemorrhage   . Personal history of colonic polyps 03/2010    hyperplastic  . Chest pain     intermittant left sided chest pain, non-exertional, lasting 1-2 min  . Nasal congestion current    sore throat, ears-on antibiotic  . PONV (postoperative nausea and vomiting)   . Diabetes mellitus     type II, Hemoglobin A1C 9.9 10/05/2011    Past Surgical History  Procedure Date  . Tubal ligation   . Appendectomy   . Left breast cyst removal 2000    History   Social History  . Marital Status: Married    Spouse Name: Devar Osentoski    Number of Children: N/A  . Years of Education: N/A   Occupational History  . 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 Smoking status: Never Smoker   . Smokeless tobacco: Not on file  . Alcohol Use: No  . Drug Use: No  . Sexually Active: Not on file   Other Topics Concern  . Not on file   Social History Narrative  . No narrative on file    Current Outpatient Prescriptions on File Prior to Visit  Medication Sig Dispense Refill  . ACIPHEX 20 MG tablet TAKE 1 TABLET BY MOUTH EVERY DAY  30 tablet  6  . atorvastatin (LIPITOR) 40 MG tablet TAKE 1 TABLET BY MOUTH DAILY  30 tablet  2  . B-D ULTRAFINE III SHORT PEN 31G X 8 MM MISC USE 4 TIMES DAILY AS DIRECTED  100 each  3  . DIOVAN 320 MG tablet TAKE 1 TABLET  BY MOUTH EVERY DAY  30 tablet  4  . gabapentin (NEURONTIN) 300 MG capsule TAKE ONE CAPSULE BY MOUTH TWICE A DAY  60 capsule  4  . glucose blood (ONETOUCH VERIO IQ) test strip 2x a day, and lancets 250.01  100 each  12  . ibuprofen (ADVIL,MOTRIN) 200 MG tablet Take 400 mg by mouth every 6 (six) hours as needed. For arthritis pain      . insulin lispro protamine-insulin lispro (HUMALOG 75/25) (75-25) 100 UNIT/ML SUSP Inject 50 Units into the skin 2 (two) times daily with a meal.      . polyethylene glycol (MIRALAX / GLYCOLAX) packet Take 17 g by mouth at bedtime.      . valACYclovir (VALTREX) 500 MG tablet Take 500 mg by mouth daily.        Allergies  Allergen Reactions  . Nsaids Nausea And Vomiting    Cannot tolerate large doses     Family History  Problem Relation Age of Onset  . Colon cancer Sister   . Diabetes Sister   . Diabetes Brother   . Heart disease Father   . Other Father     2  collapsed lungs    BP 130/80  Pulse 68  Temp 97.4 F (36.3 C) (Oral)  Ht 5' 5"  (1.651 m)  Wt 247 lb 2 oz (112.095 kg)  BMI 41.12 kg/m2  SpO2 97%    Review of Systems She has nasal congestion.  denies hypoglycemia.      Objective:   Physical Exam VITAL SIGNS:  See vs page GENERAL: no distress head: no deformity eyes: no periorbital swelling, no proptosis external nose and ears are normal mouth: no lesion seen Both tm's are slightly red LUNGS:  Clear to auscultation.  Lab Results  Component Value Date   HGBA1C 9.9* 10/05/2011      Assessment & Plan:  DM.  therapy limited by noncompliance.  i'll do the best i can. Acute bronchitis.  New Chest pain, uncertain etiology

## 2011-10-05 NOTE — Patient Instructions (Addendum)
blood tests are being requested for you today.  You will receive a letter with results. Please come back for a regular physical appointment in 3 months pending the test results, please continue insulin, 50 units 2x a day (with first and last meals of the day).  i have sent a prescription to your pharmacy, for an antibiotic pill.  Let's check a "treadmill" test.  you will receive a phone call, about a day and time for an appointment check your blood sugar twice a day.  vary the time of day when you check, between before the 3 meals, and at bedtime.  also check if you have symptoms of your blood sugar being too high or too low.  please keep a record of the readings and bring it to your next appointment here.  please call us sooner if your blood sugar goes below 70, or if it stays over 200.

## 2011-10-06 ENCOUNTER — Telehealth: Payer: Self-pay | Admitting: *Deleted

## 2011-10-06 ENCOUNTER — Encounter (HOSPITAL_COMMUNITY)
Admission: RE | Admit: 2011-10-06 | Discharge: 2011-10-06 | Disposition: A | Discharge status: Home / Self Care | Payer: 59 | Source: Ambulatory Visit | Attending: Obstetrics and Gynecology | Admitting: Obstetrics and Gynecology

## 2011-10-06 ENCOUNTER — Encounter (HOSPITAL_COMMUNITY): Payer: Self-pay

## 2011-10-06 HISTORY — DX: Other specified postprocedural states: R11.2

## 2011-10-06 HISTORY — DX: Other specified postprocedural states: Z98.890

## 2011-10-06 LAB — COMPREHENSIVE METABOLIC PANEL
ALT: 33 U/L (ref 0–35)
AST: 27 U/L (ref 0–37)
Albumin: 3.5 g/dL (ref 3.5–5.2)
Alkaline Phosphatase: 101 U/L (ref 39–117)
BUN: 17 mg/dL (ref 6–23)
CO2: 29 mEq/L (ref 19–32)
Calcium: 9.1 mg/dL (ref 8.4–10.5)
Chloride: 105 mEq/L (ref 96–112)
Creatinine, Ser: 0.78 mg/dL (ref 0.50–1.10)
GFR calc Af Amer: >90 mL/min (ref >90)
GFR calc non Af Amer: 88 mL/min — ABNORMAL LOW (ref >90)
Glucose, Bld: 194 mg/dL — ABNORMAL HIGH (ref 70–99)
Potassium: 4 mEq/L (ref 3.5–5.1)
Sodium: 140 mEq/L (ref 135–145)
Total Bilirubin: 0.5 mg/dL (ref 0.3–1.2)
Total Protein: 6.7 g/dL (ref 6.0–8.3)

## 2011-10-06 LAB — CBC
HCT: 40.8 % (ref 36.0–46.0)
Hemoglobin: 13.9 g/dL (ref 12.0–15.0)
MCH: 31.7 pg (ref 26.0–34.0)
MCHC: 34.1 g/dL (ref 30.0–36.0)
MCV: 93.2 fL (ref 78.0–100.0)
Platelets: 246 10*3/uL (ref 150–400)
RBC: 4.38 MIL/uL (ref 3.87–5.11)
RDW: 12.5 % (ref 11.5–15.5)
WBC: 4.1 10*3/uL (ref 4.0–10.5)

## 2011-10-06 NOTE — Telephone Encounter (Signed)
Called pt to inform of lab results, pt informed via VM and to callback office with any questions/concerns. (Letter also mailed to pt)

## 2011-10-06 NOTE — Patient Instructions (Addendum)
   Your procedure is scheduled on: Tuesday June 18th  Enter through the Main Entrance of [facility] at: 8:30am Pick up the phone at the desk and dial [phone] and inform us of your arrival.  Please call this number if you have any problems the morning of surgery: [phone]  Remember: Do not eat food after midnight:Monday Do not drink clear liquids after: midnight Monday Take these medicines the morning of surgery with a SIP OF WATER: diovan, gabapentin, take 1/2 of your evening dose of insulin on Monday. Hold your insulin Tuesday morning.   Do not wear jewelry, make-up, or FINGER nail polish Do not wear lotions, powders, perfumes or deodorant. Do not shave 48 hours prior to surgery. Do not bring valuables to the hospital. Contacts, dentures or bridgework may not be worn into surgery.   Patients discharged on the day of surgery will not be allowed to drive home.     Remember to use your hibiclens as instructed.Please shower with 1/2 bottle the evening before your surgery and the other 1/2 bottle the morning of surgery. Neck down avoiding private area.

## 2011-10-06 NOTE — Pre-Procedure Instructions (Addendum)
Dr Royce Macadamia reviewed EKG 08/07/10 and 10/05/11. Informed of pt c/o intermittant chest pain to primary care Dr Sarita Bottom time 1 month ago-the plan is to schedule a stress test-Dr Loanne Drilling is aware of plan for surgery. Pt was in to PCP office with cough and sore throat-antibiotic started-no temperature elevation. Aware of b/p 160/87-on Diovan-to take am of surgery. Aware of Hemoglobin A1C 9.9 yesterday 10/05/11-on Humalog 75/25 50unit bid-to take 1/2 dose evening prior to surgery and hold day of surgery. Explained to pt to call [facility] in am if necessary.stress test 2006 with EF 76%

## 2011-10-16 MED ORDER — CEFAZOLIN SODIUM-DEXTROSE 2-3 GM-% IV SOLR
2.0000 g | INTRAVENOUS | Status: AC
  Administered 2011-10-17: 2 g via INTRAVENOUS
  Filled 2011-10-16: qty 50

## 2011-10-17 ENCOUNTER — Encounter (HOSPITAL_COMMUNITY): Payer: Self-pay | Admitting: Anesthesiology

## 2011-10-17 ENCOUNTER — Ambulatory Visit (HOSPITAL_COMMUNITY)
Admission: RE | Admit: 2011-10-17 | Discharge: 2011-10-17 | Disposition: A | Discharge status: Home / Self Care | Payer: 59 | Source: Ambulatory Visit | Attending: Obstetrics and Gynecology | Admitting: Obstetrics and Gynecology

## 2011-10-17 ENCOUNTER — Ambulatory Visit (HOSPITAL_COMMUNITY): Payer: 59 | Admitting: Anesthesiology

## 2011-10-17 ENCOUNTER — Encounter (HOSPITAL_COMMUNITY)
Admission: RE | Disposition: A | Discharge status: Home / Self Care | Payer: Self-pay | Source: Ambulatory Visit | Attending: Obstetrics and Gynecology

## 2011-10-17 DIAGNOSIS — N840 Polyp of corpus uteri: Secondary | ICD-10-CM | POA: Insufficient documentation

## 2011-10-17 DIAGNOSIS — R9389 Abnormal findings on diagnostic imaging of other specified body structures: Secondary | ICD-10-CM | POA: Insufficient documentation

## 2011-10-17 DIAGNOSIS — N950 Postmenopausal bleeding: Secondary | ICD-10-CM | POA: Insufficient documentation

## 2011-10-17 DIAGNOSIS — E119 Type 2 diabetes mellitus without complications: Secondary | ICD-10-CM

## 2011-10-17 LAB — GLUCOSE, CAPILLARY
Glucose-Capillary: 172 mg/dL — ABNORMAL HIGH (ref 70–99)
Glucose-Capillary: 171 mg/dL — ABNORMAL HIGH (ref 70–99)

## 2011-10-17 SURGERY — DILATATION & CURETTAGE/HYSTEROSCOPY WITH RESECTOCOPE
Anesthesia: General | Wound class: Clean Contaminated

## 2011-10-17 MED ORDER — MIDAZOLAM HCL 2 MG/2ML IJ SOLN
INTRAMUSCULAR | Status: AC
  Filled 2011-10-17: qty 2

## 2011-10-17 MED ORDER — PROPOFOL 10 MG/ML IV EMUL
INTRAVENOUS | Status: DC | PRN
  Administered 2011-10-17: 20 mg via INTRAVENOUS
  Administered 2011-10-17: 180 mg via INTRAVENOUS

## 2011-10-17 MED ORDER — GLYCINE 1.5 % IR SOLN
Status: DC | PRN
  Administered 2011-10-17: 3000 mL

## 2011-10-17 MED ORDER — ROCURONIUM BROMIDE 50 MG/5ML IV SOLN
INTRAVENOUS | Status: AC
  Filled 2011-10-17: qty 1

## 2011-10-17 MED ORDER — FENTANYL CITRATE 0.05 MG/ML IJ SOLN
25.0000 ug | INTRAMUSCULAR | Status: DC | PRN
  Administered 2011-10-17 (×3): 25 ug via INTRAVENOUS

## 2011-10-17 MED ORDER — LIDOCAINE HCL (CARDIAC) 20 MG/ML IV SOLN
INTRAVENOUS | Status: DC | PRN
  Administered 2011-10-17: 50 mg via INTRAVENOUS
  Administered 2011-10-17: 40 mg via INTRAVENOUS

## 2011-10-17 MED ORDER — LIDOCAINE HCL (CARDIAC) 20 MG/ML IV SOLN
INTRAVENOUS | Status: AC
  Filled 2011-10-17: qty 5

## 2011-10-17 MED ORDER — METOCLOPRAMIDE HCL 5 MG/ML IJ SOLN
10.0000 mg | Freq: Once | INTRAMUSCULAR | Status: DC
  Filled 2011-10-17: qty 2

## 2011-10-17 MED ORDER — ONDANSETRON HCL 4 MG/2ML IJ SOLN
INTRAMUSCULAR | Status: AC
  Filled 2011-10-17: qty 2

## 2011-10-17 MED ORDER — ONDANSETRON HCL 4 MG/2ML IJ SOLN
INTRAMUSCULAR | Status: DC | PRN
  Administered 2011-10-17: 4 mg via INTRAVENOUS

## 2011-10-17 MED ORDER — SUCCINYLCHOLINE CHLORIDE 20 MG/ML IJ SOLN
INTRAMUSCULAR | Status: DC | PRN
  Administered 2011-10-17: 120 mg via INTRAVENOUS

## 2011-10-17 MED ORDER — METOCLOPRAMIDE HCL 5 MG/ML IJ SOLN
INTRAMUSCULAR | Status: AC
  Filled 2011-10-17: qty 2

## 2011-10-17 MED ORDER — MIDAZOLAM HCL 5 MG/5ML IJ SOLN
INTRAMUSCULAR | Status: DC | PRN
  Administered 2011-10-17: 2 mg via INTRAVENOUS

## 2011-10-17 MED ORDER — LIDOCAINE HCL 1 % IJ SOLN
INTRAMUSCULAR | Status: DC | PRN
  Administered 2011-10-17: 10 mL

## 2011-10-17 MED ORDER — SUCCINYLCHOLINE CHLORIDE 20 MG/ML IJ SOLN
INTRAMUSCULAR | Status: AC
  Filled 2011-10-17: qty 10

## 2011-10-17 MED ORDER — LACTATED RINGERS IV SOLN
INTRAVENOUS | Status: DC
  Administered 2011-10-17 (×2): via INTRAVENOUS

## 2011-10-17 MED ORDER — FENTANYL CITRATE 0.05 MG/ML IJ SOLN
INTRAMUSCULAR | Status: DC | PRN
  Administered 2011-10-17: 100 ug via INTRAVENOUS

## 2011-10-17 MED ORDER — FENTANYL CITRATE 0.05 MG/ML IJ SOLN
INTRAMUSCULAR | Status: AC
  Filled 2011-10-17: qty 2

## 2011-10-17 MED ORDER — PROMETHAZINE HCL 25 MG/ML IJ SOLN
INTRAMUSCULAR | Status: AC
  Filled 2011-10-17: qty 1

## 2011-10-17 MED ORDER — ROCURONIUM BROMIDE 100 MG/10ML IV SOLN
INTRAVENOUS | Status: DC | PRN
  Administered 2011-10-17: 5 mg via INTRAVENOUS

## 2011-10-17 MED ORDER — PROPOFOL 10 MG/ML IV EMUL
INTRAVENOUS | Status: AC
  Filled 2011-10-17: qty 20

## 2011-10-17 MED ORDER — PHENYLEPHRINE HCL 10 MG/ML IJ SOLN
INTRAMUSCULAR | Status: DC | PRN
  Administered 2011-10-17: 40 ug via INTRAVENOUS
  Administered 2011-10-17: 80 ug via INTRAVENOUS

## 2011-10-17 MED ORDER — SCOPOLAMINE 1 MG/3DAYS TD PT72
MEDICATED_PATCH | TRANSDERMAL | Status: AC
  Administered 2011-10-17: 1.5 mg
  Filled 2011-10-17: qty 1

## 2011-10-17 MED ORDER — PROMETHAZINE HCL 25 MG/ML IJ SOLN
6.2500 mg | INTRAMUSCULAR | Status: DC | PRN
  Administered 2011-10-17: 6.25 mg via INTRAVENOUS

## 2011-10-17 SURGICAL SUPPLY — 14 items
CANISTER SUCTION 2500CC (MISCELLANEOUS) ×2 IMPLANT
CATH ROBINSON RED A/P 16FR (CATHETERS) ×2 IMPLANT
CLOTH BEACON ORANGE TIMEOUT ST (SAFETY) ×2 IMPLANT
CONTAINER PREFILL 10% NBF 60ML (FORM) ×4 IMPLANT
ELECT REM PT RETURN 9FT ADLT (ELECTROSURGICAL) ×2
ELECTRODE REM PT RTRN 9FT ADLT (ELECTROSURGICAL) ×1 IMPLANT
GLOVE ECLIPSE 7.0 STRL STRAW (GLOVE) ×4 IMPLANT
GOWN PREVENTION PLUS LG XLONG (DISPOSABLE) ×2 IMPLANT
GOWN PREVENTION PLUS XLARGE (GOWN DISPOSABLE) ×2 IMPLANT
GOWN STRL REIN XL XLG (GOWN DISPOSABLE) ×2 IMPLANT
LOOP ANGLED CUTTING 22FR (CUTTING LOOP) IMPLANT
PACK HYSTEROSCOPY LF (CUSTOM PROCEDURE TRAY) ×2 IMPLANT
TOWEL OR 17X24 6PK STRL BLUE (TOWEL DISPOSABLE) ×4 IMPLANT
WATER STERILE IRR 1000ML POUR (IV SOLUTION) ×2 IMPLANT

## 2011-10-17 NOTE — Anesthesia Postprocedure Evaluation (Signed)
Anesthesia Post Note  Patient: Jessica [name]  Procedure(s) Performed: Procedure(s) (LRB): DILATATION & CURETTAGE/HYSTEROSCOPY WITH RESECTOCOPE (N/A)  Anesthesia type: GA  Patient location: PACU  Post pain: Pain level controlled  Post assessment: Post-op Vital signs reviewed  Last Vitals:  Filed Vitals:   10/17/11 1200  BP: 188/86  Pulse: 62  Temp:   Resp: 16    Post vital signs: Reviewed  Level of consciousness: sedated  Complications: No apparent anesthesia complications

## 2011-10-17 NOTE — Discharge Instructions (Signed)
Hysteroscopy Hysteroscopy is a procedure used for looking inside the womb (uterus). It may be done for many different reasons, including:  To evaluate abnormal bleeding, fibroid (benign, noncancerous) tumors, polyps, scar tissue (adhesions), and possibly cancer of the uterus.   To look for lumps (tumors) and other uterine growths.   To look for causes of why a woman cannot get pregnant (infertility), causes of recurrent loss of pregnancy (miscarriages), or a lost intrauterine device (IUD).   To perform a sterilization by blocking the fallopian tubes from inside the uterus.  A hysteroscopy should be done right after a menstrual period to be sure you are not pregnant. LET YOUR CAREGIVER KNOW ABOUT:   Allergies.   Medicines taken, including herbs, eyedrops, over-the-counter medicines, and creams.   Use of steroids (by mouth or creams).   Previous problems with anesthetics or numbing medicines.   History of bleeding or blood problems.   History of blood clots.   Possibility of pregnancy, if this applies.   Previous surgery.   Other health problems.  RISKS AND COMPLICATIONS   Putting a hole in the uterus.   Excessive bleeding.   Infection.   Damage to the cervix.   Injury to other organs.   Allergic reaction to medicines.   Too much fluid used in the uterus for the procedure.  BEFORE THE PROCEDURE   Do not take aspirin or blood thinners for a week before the procedure, or as directed. It can cause bleeding.   Arrive at least 60 minutes before the procedure or as directed to read and sign the necessary forms.   Arrange for someone to take you home after the procedure.   If you smoke, do not smoke for 2 weeks before the procedure.  PROCEDURE   Your caregiver may give you medicine to relax you. He or she may also give you a medicine that numbs the area around the cervix (local anesthetic) or a medicine that makes you sleep (general anesthesia).   Sometimes, a  medicine is placed in the cervix the day before the procedure. This medicine makes the cervix have a larger opening (dilate). This makes it easier for the instrument to be inserted into the uterus.   A small instrument (hysteroscope) is inserted through the vagina into the uterus. This instrument is similar to a pencil-sized telescope with a light.   During the procedure, air or a liquid is put into the uterus, which allows the surgeon to see better.   Sometimes, tissue is gently scraped from inside the uterus. These tissue samples are sent to a specialist who looks at tissue samples (pathologist). The pathologist will give a report to your caregiver. This will help your caregiver decide if further treatment is necessary. The report will also help your caregiver decide on the best treatment if the test comes back abnormal.  AFTER THE PROCEDURE   If you had a general anesthetic, you may be groggy for a couple hours after the procedure.   If you had a local anesthetic, you will be advised to rest at the surgical center or caregiver's office until you are stable and feel ready to go home.   You may have some cramping for a couple days.   You may have bleeding, which varies from light spotting for a few days to menstrual-like bleeding for up to 3 to 7 days. This is normal.   Have someone take you home.  FINDING OUT THE RESULTS OF YOUR TEST Not all test results  are available during your visit. If your test results are not back during the visit, make an appointment with your caregiver to find out the results. Do not assume everything is normal if you have not heard from your caregiver or the medical facility. It is important for you to follow up on all of your test results. HOME CARE INSTRUCTIONS   Do not drive for 24 hours or as instructed.   Only take over-the-counter or prescription medicines for pain, discomfort, or fever as directed by your caregiver.   Do not take aspirin. It can cause or  aggravate bleeding.   Do not drive or drink alcohol while taking pain medicine.   You may resume your usual diet.   Do not use tampons, douche, or have sexual intercourse for 2 weeks, or as advised by your caregiver.   Rest and sleep for the first 24 to 48 hours.   Take your temperature twice a day for 4 to 5 days. Write it down. Give these temperatures to your caregiver if they are abnormal (above 98.6 F or 37.0 C).   Take medicines your caregiver has ordered as directed.   Follow your caregiver's advice regarding diet, exercise, lifting, driving, and general activities.   Take showers instead of baths for 2 weeks, or as recommended by your caregiver.   If you develop constipation:   Take a mild laxative with the advice of your caregiver.   Eat bran foods.   Drink enough water and fluids to keep your urine clear or pale yellow.   Try to have someone with you or available to you for the first 24 to 48 hours, especially if you had a general anesthetic.   Make sure you and your family understand everything about your operation and recovery.   Follow your caregiver's advice regarding follow-up appointments and Pap smears.  SEEK MEDICAL CARE IF:   You feel dizzy or lightheaded.   You feel sick to your stomach (nauseous).   You develop abnormal vaginal discharge.   You develop a rash.   You have an abnormal reaction or allergy to your medicine.   You need stronger pain medicine.  SEEK IMMEDIATE MEDICAL CARE IF:   Bleeding is heavier than a normal menstrual period or you have blood clots.   You have an oral temperature above 102 F (38.9 C), not controlled by medicine.   You have increasing cramps or pains not relieved with medicine.   You develop belly (abdominal) pain that does not seem to be related to the same area of earlier cramping and pain.   You pass out.   You develop pain in the tops of your shoulders (shoulder strap areas).   You develop shortness of  breath.  MAKE SURE YOU:   Understand these instructions.   Will watch your condition.   Will get help right away if you are not doing well or get worse.  Document Released: 07/24/2000 Document Revised: 04/06/2011 Document Reviewed: 11/16/2008 [facility] Patient Information 2012 [place].

## 2011-10-17 NOTE — Transfer of Care (Signed)
Immediate Anesthesia Transfer of Care Note  Patient: Jessica [name]  Procedure(s) Performed: Procedure(s) (LRB): DILATATION & CURETTAGE/HYSTEROSCOPY WITH RESECTOCOPE (N/A)  Patient Location: PACU  Anesthesia Type: General  Level of Consciousness: sedated  Airway & Oxygen Therapy: Patient Spontanous Breathing and Patient connected to nasal cannula oxygen  Post-op Assessment: Report given to PACU RN  Post vital signs: Reviewed and stable  Complications: No apparent anesthesia complications

## 2011-10-17 NOTE — Op Note (Signed)
Jessica [name], Jessica [name]               ACCOUNT NO.:  [id number]  MEDICAL RECORD NO.:  [id number]  LOCATION:  [facility]                          FACILITY:  [facility]  PHYSICIAN:  Freda Munro, M.D.    DATE OF BIRTH:  [birth year]  DATE OF PROCEDURE:  10/17/2011 DATE OF DISCHARGE:                              OPERATIVE REPORT   PREOPERATIVE DIAGNOSES: 1. Postmenopausal bleeding. 2. Abnormal endometrial lining on ultrasound.  POSTOP DIAGNOSES: 1. Postmenopausal bleeding. 2. Abnormal endometrial lining on ultrasound with large endometrial     polyp.  PROCEDURE: 1. Hysteroscopy. 2. Resection of endometrial polyp. 3. Dilation and curettage.  SURGEON:  Freda Munro, M.D.  ANESTHESIA:  General with paracervical block.  DRAINS:  Red rubber catheter bladder.  ANTIBIOTICS:  Ancef 2 g.  SPECIMENS:  Endometrial curettings and endometrial polyp sent to pathology.  ESTIMATED BLOOD LOSS:  30 mL.  COMPLICATIONS:  None.  PROCEDURE:  The patient was taken to the operating room, where she was placed in dorsal supine position and general anesthetic was administered.  She was then placed in dorsal lithotomy position.  She was prepped and draped in the usual fashion for this procedure.  A sterile speculum was placed in the vagina, 20 mL of 1% lidocaine was used for paracervical block.  A single-tooth tenaculum was applied to the anterior cervical lip.  The cervical os was then serially dilated to a 29-French.  The hysteroscope was advanced through the endocervical canal which appeared to be normal.  On entering the uterine cavity, there was a large endometrial polyp which encompassed the entire cavity. The stalk was near the left ostia anteriorly.  At this point, the resectoscope was used to remove the polyp and the base entirely. Following this, a sharp curettage was performed.  Following my follow up hysteroscopy, this revealed that there was no evidence of anymore polyps or abnormalities.  The  patient tolerated the procedure well.  She was taken to the recovery room in stable condition. Instrument and lap count was correct x2.  The patient will be discharged to home.  She will be sent home with Advil and Percocet take p.r.n.  She will follow up in the office in 4 weeks.          ______________________________ Freda Munro, M.D.     MA/MEDQ  D:  10/17/2011  T:  10/17/2011  Job:  [id number]

## 2011-10-17 NOTE — H&P (Signed)
Pt is a 61 year old black female, G2P2002, who presents for D&C, Hysteroscopy for post menopausal spotting and an abnormally thickened endometrial stripe.  Pt has a sister who was recently diagnosed with endometrial cancer. Pt had a normal EMB recently but there was not a significant amount of tissue. The stripe was 1.8cm.  PE: overwt black female in NAD        HEENT-wnl        Abd-no masses, non tender        Pelvic-wnl IMP/ Post menopausal bleeding with abnormal endometrium on ultrasound Plan/ Proceed with D&C, Hysteroscopy

## 2011-10-17 NOTE — Anesthesia Preprocedure Evaluation (Addendum)
Anesthesia Evaluation  Patient identified by MRN, date of birth, ID band Patient awake    Reviewed: Allergy & Precautions, H&P , NPO status , Patient's Chart, lab work & pertinent test results, reviewed documented beta blocker date and time   History of Anesthesia Complications (+) PONV  Airway Mallampati: III TM Distance: >3 FB Neck ROM: full    Dental  (+) Teeth Intact   Pulmonary neg pulmonary ROS,  breath sounds clear to auscultation  Pulmonary exam normal       Cardiovascular Exercise Tolerance: Good hypertension (170/68 today, took med today), On Medications Rhythm:regular Rate:Normal  Negative stress test in 2006   Neuro/Psych negative neurological ROS  negative psych ROS   GI/Hepatic Neg liver ROS, GERD-  Medicated,gastroparesis   Endo/Other  Diabetes mellitus-, Type 2, Insulin DependentMorbid obesityDiabetic neuropathy on gabapentin (feet)  Renal/GU negative Renal ROS  Female GU complaint (PMB)     Musculoskeletal   Abdominal   Peds  Hematology negative hematology ROS (+)   Anesthesia Other Findings   Reproductive/Obstetrics negative OB ROS                           Anesthesia Physical Anesthesia Plan  ASA: III  Anesthesia Plan: General ETT   Post-op Pain Management:    Induction:   Airway Management Planned: Oral ETT  Additional Equipment:   Intra-op Plan:   Post-operative Plan: Extubation in OR  Informed Consent: I have reviewed the patients History and Physical, chart, labs and discussed the procedure including the risks, benefits and alternatives for the proposed anesthesia with the patient or authorized representative who has indicated his/her understanding and acceptance.   Dental Advisory Given  Plan Discussed with: CRNA and Surgeon  Anesthesia Plan Comments: (Discussed spinal as alternative to GA with less chance of PONV, but patient prefers general  anesthetic. Given history of DM, MO, and gastroparesis - will proceed with GETA PONV prophylaxis with scopolamine and reglan pre-op, decadron and zofran intra-op)       Anesthesia Quick Evaluation

## 2011-11-23 ENCOUNTER — Encounter: Payer: 59 | Admitting: Nurse Practitioner

## 2011-11-24 ENCOUNTER — Other Ambulatory Visit: Payer: Self-pay | Admitting: Endocrinology

## 2011-12-04 ENCOUNTER — Ambulatory Visit (INDEPENDENT_AMBULATORY_CARE_PROVIDER_SITE_OTHER): Payer: 59 | Admitting: Physician Assistant

## 2011-12-04 ENCOUNTER — Encounter: Payer: Self-pay | Admitting: Physician Assistant

## 2011-12-04 DIAGNOSIS — R079 Chest pain, unspecified: Secondary | ICD-10-CM

## 2011-12-04 NOTE — Procedures (Signed)
Jessica [name] is a 61 y.o. female with no hx of CAD referred by PCP for ETT for evaluation of chest pain.  PMH: DM2, HTN, HL.  Non smoker.  +Fhx CAD (dad with MI/CHF died in 64s).  CP atypical.  Exam unremarkable.     Exercise Treadmill Test  Pre-Exercise Testing Evaluation3 Rhythm: normal sinus  Rate: .71   PR:  .14 QRS:  .08  QT:  .39 QTc: .42     Test  Exercise Tolerance Test Ordering MD: Jacinto Halim , MD  Interpreting MD: Richardson Dopp , PA-C  Unique Test No: 1  Treadmill:  1  Indication for ETT: chest pain - rule out ischemia  Contraindication to ETT: No   Stress Modality: exercise - treadmill  Cardiac Imaging Performed: non   Protocol: standard Bruce - maximal  Max BP:  210/53  Max MPHR (bpm):  159 85% MPR (bpm):  135  MPHR obtained (bpm): 153 % MPHR obtained:  95%  Reached 85% MPHR (min:sec):  2:25 Total Exercise Time (min-sec):  4:00  Workload in METS:  5.8 Borg Scale: 12  Reason ETT Terminated:  desired heart rate attained    ST Segment Analysis At Rest: normal ST segments - no evidence of significant ST depression With Exercise: no evidence of significant ST depression  Other Information Arrhythmia:  No Angina during ETT:  absent (0) Quality of ETT:  diagnostic  ETT Interpretation:  normal - no evidence of ischemia by ST analysis  Comments: Poor exercise tolerance. No chest pain. There was mild dyspnea.  Hypertensive BP response to exercise. No ST-T changes to suggest ischemia.   Recommendations: Follow up with PCP for risk factor modification. Richardson Dopp, PA-C  3:09 PM 12/04/2011

## 2011-12-09 ENCOUNTER — Other Ambulatory Visit: Payer: Self-pay | Admitting: Gastroenterology

## 2011-12-19 ENCOUNTER — Other Ambulatory Visit: Payer: Self-pay | Admitting: Endocrinology

## 2012-01-04 ENCOUNTER — Encounter: Payer: Self-pay | Admitting: *Deleted

## 2012-01-04 ENCOUNTER — Encounter: Payer: Self-pay | Admitting: Endocrinology

## 2012-01-04 ENCOUNTER — Ambulatory Visit (INDEPENDENT_AMBULATORY_CARE_PROVIDER_SITE_OTHER): Payer: 59 | Admitting: Endocrinology

## 2012-01-04 ENCOUNTER — Other Ambulatory Visit (INDEPENDENT_AMBULATORY_CARE_PROVIDER_SITE_OTHER): Payer: 59

## 2012-01-04 VITALS — BP 124/82 | HR 64 | Temp 97.4°F | Ht 65.0 in | Wt 251.0 lb

## 2012-01-04 DIAGNOSIS — R300 Dysuria: Secondary | ICD-10-CM | POA: Insufficient documentation

## 2012-01-04 DIAGNOSIS — R079 Chest pain, unspecified: Secondary | ICD-10-CM

## 2012-01-04 DIAGNOSIS — E119 Type 2 diabetes mellitus without complications: Secondary | ICD-10-CM

## 2012-01-04 LAB — URINALYSIS, ROUTINE W REFLEX MICROSCOPIC
Bilirubin Urine: NEGATIVE
Hgb urine dipstick: NEGATIVE
Ketones, ur: NEGATIVE
Leukocytes, UA: NEGATIVE
Nitrite: NEGATIVE
Specific Gravity, Urine: >=1.03 (ref 1.000–1.030)
Total Protein, Urine: NEGATIVE
Urine Glucose: 100
Urobilinogen, UA: 0.2 (ref 0.0–1.0)
pH: 5.5 (ref 5.0–8.0)

## 2012-01-04 LAB — HEMOGLOBIN A1C: Hgb A1c MFr Bld: 9.3 % — ABNORMAL HIGH (ref 4.6–6.5)

## 2012-01-04 NOTE — Progress Notes (Signed)
Subjective:    Patient ID: Jessica [name], female    DOB: [birth year], 61 y.o.   MRN: [id number]  HPI Pt returns for f/u of IDDM (dx'ed; complicated by autonomic neuropathy).  no cbg record, but states cbg's vary from 105-200.  It is in general higher as the day goes on.  She says family illnesses have compromised her ability to care for her DM.  She has an excessive appetite.   Pt states 1 week of moderate burning at the urethra, in the context of urination.  No assoc hematuria.   Past Medical History  Diagnosis Date  . Asthma     childhood  . Hemorrhoids   . Autonomic neuropathy     diabetic  . Hypertension   . Gastroparesis   . Unspecified gastritis and gastroduodenitis without mention of hemorrhage   . Personal history of colonic polyps 03/2010    hyperplastic  . Chest pain     intermittant left sided chest pain, non-exertional, lasting 1-2 min  . Nasal congestion current    sore throat, ears-on antibiotic  . PONV (postoperative nausea and vomiting)   . Diabetes mellitus     type II, Hemoglobin A1C 9.9 10/05/2011    Past Surgical History  Procedure Date  . Tubal ligation   . Appendectomy   . Left breast cyst removal 2000    History   Social History  . Marital Status: Married    Spouse Name: Devar Dull    Number of Children: N/A  . Years of Education: N/A   Occupational History  . 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 Smoking status: Never Smoker   . Smokeless tobacco: Not on file  . Alcohol Use: No  . Drug Use: No  . Sexually Active: Not on file   Other Topics Concern  . Not on file   Social History Narrative  . No narrative on file    Current Outpatient Prescriptions on File Prior to Visit  Medication Sig Dispense Refill  . ACIPHEX 20 MG tablet TAKE 1 TABLET BY MOUTH EVERY DAY  30 tablet  6  . atorvastatin (LIPITOR) 40 MG tablet TAKE 1 TABLET BY MOUTH DAILY  30 tablet  5  . B-D ULTRAFINE III SHORT PEN 31G X 8 MM  MISC USE 4 TIMES DAILY AS DIRECTED  100 each  3  . DIOVAN 320 MG tablet TAKE 1 TABLET BY MOUTH EVERY DAY  30 tablet  4  . gabapentin (NEURONTIN) 300 MG capsule TAKE ONE CAPSULE BY MOUTH TWICE A DAY  60 capsule  4  . insulin lispro protamine-insulin lispro (HUMALOG MIX 75/25 KWIKPEN) (75-25) 100 UNIT/ML SUSP       . ONE TOUCH ULTRA TEST test strip USE AS DIRECTED  100 each  2  . polyethylene glycol powder (GLYCOLAX/MIRALAX) powder USE ONE SCOOP AS DIRECTED BY MD 1-2 TIMES A DAY  527 g  2  . valACYclovir (VALTREX) 500 MG tablet Take 500 mg by mouth daily.        Allergies  Allergen Reactions  . Nsaids Nausea And Vomiting    Cannot tolerate large doses     Family History  Problem Relation Age of Onset  . Colon cancer Sister   . Diabetes Sister   . Diabetes Brother   . Heart disease Father   . Other Father     2 collapsed lungs   BP 124/82  Pulse 64  Temp 97.4  F (36.3 C) (Oral)  Ht 5' 5"  (1.651 m)  Wt 251 lb (113.853 kg)  BMI 41.77 kg/m2  SpO2 97%  Review of Systems Denies fever and hypoglycemia    Objective:   Physical Exam VITAL SIGNS:  See vs page GENERAL: no distress Pulses: dorsalis pedis intact bilat.   Feet: no deformity.  no ulcer on the feet.  feet are of normal color and temp.  Trace bilat ankle edema Neuro: sensation is intact to touch on the feet  Lab Results  Component Value Date   HGBA1C 9.3* 01/04/2012   (i reviewed ua result)    Assessment & Plan:  DM: needs increased rx Urinary sxs, uncertain etiology, new

## 2012-01-04 NOTE — Patient Instructions (Addendum)
Blood and urine tests are being requested for you today.  You will receive a letter with results. Please come back for a regular physical appointment in 3 months pending the test results, please continue insulin, 50 units 2x a day (with first and last meals of the day).  check your blood sugar twice a day.  vary the time of day when you check, between before the 3 meals, and at bedtime.  also check if you have symptoms of your blood sugar being too high or too low.  please keep a record of the readings and bring it to your next appointment here.  please call us sooner if your blood sugar goes below 70, or if it stays over 200.

## 2012-01-06 ENCOUNTER — Other Ambulatory Visit: Payer: Self-pay | Admitting: Endocrinology

## 2012-01-06 DIAGNOSIS — K2990 Gastroduodenitis, unspecified, without bleeding: Secondary | ICD-10-CM

## 2012-01-06 DIAGNOSIS — K2970 Gastritis, unspecified, without bleeding: Secondary | ICD-10-CM

## 2012-01-06 MED ORDER — FLUCONAZOLE 150 MG PO TABS: 150.0000 mg | ORAL_TABLET | Freq: Every day | ORAL | Status: AC

## 2012-01-09 ENCOUNTER — Other Ambulatory Visit: Payer: Self-pay | Admitting: *Deleted

## 2012-01-09 MED ORDER — INSULIN LISPRO PROT & LISPRO (75-25 MIX) 100 UNIT/ML ~~LOC~~ SUSP: SUBCUTANEOUS | Status: DC

## 2012-01-13 ENCOUNTER — Telehealth: Payer: Self-pay | Admitting: *Deleted

## 2012-01-13 DIAGNOSIS — E119 Type 2 diabetes mellitus without complications: Secondary | ICD-10-CM

## 2012-01-13 DIAGNOSIS — Z0000 Encounter for general adult medical examination without abnormal findings: Secondary | ICD-10-CM

## 2012-01-13 NOTE — Telephone Encounter (Signed)
Message copied by Legrand Como on Sat Jan 13, 2012  9:57 AM ------      Message from: COUSIN, Kanasia T      Created: Thu Jan 04, 2012  8:25 AM      Regarding: PHY DATE  04/04/12       THANKS

## 2012-01-13 NOTE — Telephone Encounter (Signed)
Lab orders placed into Epic for upcoming CPX appointment.

## 2012-01-23 ENCOUNTER — Other Ambulatory Visit: Payer: Self-pay | Admitting: Endocrinology

## 2012-01-23 ENCOUNTER — Other Ambulatory Visit: Payer: Self-pay | Admitting: Gastroenterology

## 2012-02-06 ENCOUNTER — Other Ambulatory Visit: Payer: Self-pay | Admitting: Endocrinology

## 2012-02-06 NOTE — Telephone Encounter (Signed)
Refill req for Gabapentin, last refilled on 09/08/11. Pt last seen on 01/04/12. Ok to refill?

## 2012-02-29 ENCOUNTER — Other Ambulatory Visit: Payer: Self-pay | Admitting: Endocrinology

## 2012-02-29 ENCOUNTER — Other Ambulatory Visit: Payer: Self-pay | Admitting: Gastroenterology

## 2012-03-11 ENCOUNTER — Other Ambulatory Visit: Payer: Self-pay | Admitting: Endocrinology

## 2012-03-12 ENCOUNTER — Other Ambulatory Visit: Payer: Self-pay | Admitting: Endocrinology

## 2012-03-21 ENCOUNTER — Other Ambulatory Visit: Payer: Self-pay | Admitting: Endocrinology

## 2012-04-04 ENCOUNTER — Ambulatory Visit (INDEPENDENT_AMBULATORY_CARE_PROVIDER_SITE_OTHER): Payer: 59 | Admitting: Endocrinology

## 2012-04-04 ENCOUNTER — Encounter: Payer: Self-pay | Admitting: Endocrinology

## 2012-04-04 VITALS — BP 146/82 | HR 75 | Temp 98.1°F | Wt 255.0 lb

## 2012-04-04 DIAGNOSIS — Z0000 Encounter for general adult medical examination without abnormal findings: Secondary | ICD-10-CM

## 2012-04-04 DIAGNOSIS — D72819 Decreased white blood cell count, unspecified: Secondary | ICD-10-CM

## 2012-04-04 DIAGNOSIS — E119 Type 2 diabetes mellitus without complications: Secondary | ICD-10-CM

## 2012-04-04 DIAGNOSIS — R300 Dysuria: Secondary | ICD-10-CM

## 2012-04-04 DIAGNOSIS — Z79899 Other long term (current) drug therapy: Secondary | ICD-10-CM

## 2012-04-04 DIAGNOSIS — D509 Iron deficiency anemia, unspecified: Secondary | ICD-10-CM

## 2012-04-04 DIAGNOSIS — I10 Essential (primary) hypertension: Secondary | ICD-10-CM

## 2012-04-04 DIAGNOSIS — E7800 Pure hypercholesterolemia, unspecified: Secondary | ICD-10-CM

## 2012-04-04 LAB — HEPATIC FUNCTION PANEL
ALT: 26 U/L (ref 0–35)
AST: 22 U/L (ref 0–37)
Albumin: 3.6 g/dL (ref 3.5–5.2)
Alkaline Phosphatase: 83 U/L (ref 39–117)
Bilirubin, Direct: 0.2 mg/dL (ref 0.0–0.3)
Indirect Bilirubin: 0.4 mg/dL (ref 0.0–0.9)
Total Bilirubin: 0.6 mg/dL (ref 0.3–1.2)
Total Protein: 6.3 g/dL (ref 6.0–8.3)

## 2012-04-04 LAB — BASIC METABOLIC PANEL
BUN: 14 mg/dL (ref 6–23)
CO2: 27 mEq/L (ref 19–32)
Calcium: 9.1 mg/dL (ref 8.4–10.5)
Chloride: 104 mEq/L (ref 96–112)
Creat: 0.83 mg/dL (ref 0.50–1.10)
Glucose, Bld: 173 mg/dL — ABNORMAL HIGH (ref 70–99)
Potassium: 4.5 mEq/L (ref 3.5–5.3)
Sodium: 138 mEq/L (ref 135–145)

## 2012-04-04 LAB — LIPID PANEL
Cholesterol: 130 mg/dL (ref 0–200)
HDL: 42 mg/dL (ref >39)
LDL Cholesterol: 80 mg/dL (ref 0–99)
Total CHOL/HDL Ratio: 3.1 Ratio
Triglycerides: 40 mg/dL (ref <150)
VLDL: 8 mg/dL (ref 0–40)

## 2012-04-04 LAB — HEMOGLOBIN A1C
Hgb A1c MFr Bld: 9 % — ABNORMAL HIGH (ref <5.7)
Mean Plasma Glucose: 212 mg/dL — ABNORMAL HIGH (ref <117)

## 2012-04-04 LAB — CBC WITH DIFFERENTIAL/PLATELET
Basophils Absolute: 0 10*3/uL (ref 0.0–0.1)
Basophils Relative: 1 % (ref 0–1)
Eosinophils Absolute: 0.1 10*3/uL (ref 0.0–0.7)
Eosinophils Relative: 2 % (ref 0–5)
HCT: 41.2 % (ref 36.0–46.0)
Hemoglobin: 14 g/dL (ref 12.0–15.0)
Lymphocytes Relative: 42 % (ref 12–46)
Lymphs Abs: 1.4 10*3/uL (ref 0.7–4.0)
MCH: 32.1 pg (ref 26.0–34.0)
MCHC: 34 g/dL (ref 30.0–36.0)
MCV: 94.5 fL (ref 78.0–100.0)
Monocytes Absolute: 0.2 10*3/uL (ref 0.1–1.0)
Monocytes Relative: 7 % (ref 3–12)
Neutro Abs: 1.6 10*3/uL — ABNORMAL LOW (ref 1.7–7.7)
Neutrophils Relative %: 48 % (ref 43–77)
Platelets: 259 10*3/uL (ref 150–400)
RBC: 4.36 MIL/uL (ref 3.87–5.11)
RDW: 13.3 % (ref 11.5–15.5)
WBC: 3.3 10*3/uL — ABNORMAL LOW (ref 4.0–10.5)

## 2012-04-04 LAB — IBC PANEL
%SAT: 25 % (ref 20–55)
TIBC: 322 ug/dL (ref 250–470)
UIBC: 241 ug/dL (ref 125–400)

## 2012-04-04 LAB — TSH: TSH: 1.68 u[IU]/mL (ref 0.350–4.500)

## 2012-04-04 LAB — IRON: Iron: 81 ug/dL (ref 42–145)

## 2012-04-04 NOTE — Patient Instructions (Addendum)
Please change insulin to 65 units, twice a day (with the first and last meals of the day).   blood tests are being requested for you today.  We'll contact you with results. please consider these measures for your health:  minimize alcohol.  do not use tobacco products.  have a colonoscopy at least every 10 years from age 61.  Women should have an annual mammogram from age 52.  keep firearms safely stored.  always use seat belts.  have working smoke alarms in your home.  see an eye doctor and dentist regularly.  never drive under the influence of alcohol or drugs (including prescription drugs).  Please come back for a follow-up appointment in 3 months.  We'll recheck your blood pressure then.

## 2012-04-04 NOTE — Progress Notes (Signed)
Subjective:    Patient ID: Jessica [name], female    DOB: 1951/04/13, 61 y.o.   MRN: [id number]  HPI here for regular wellness examination.  she's feeling pretty well in general, and says chronic med probs are stable, except as noted below Past Medical History  Diagnosis Date  . Asthma     childhood  . Hemorrhoids   . Autonomic neuropathy     diabetic  . Hypertension   . Gastroparesis   . Unspecified gastritis and gastroduodenitis without mention of hemorrhage   . Personal history of colonic polyps 03/2010    hyperplastic  . Chest pain     intermittant left sided chest pain, non-exertional, lasting 1-2 min  . Nasal congestion current    sore throat, ears-on antibiotic  . PONV (postoperative nausea and vomiting)   . Diabetes mellitus     type II, Hemoglobin A1C 9.9 10/05/2011    Past Surgical History  Procedure Date  . Tubal ligation   . Appendectomy   . Left breast cyst removal 2000    History   Social History  . Marital Status: Married    Spouse Name: Devar Dearing    Number of Children: N/A  . Years of Education: N/A   Occupational History  . 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 Smoking status: Never Smoker   . Smokeless tobacco: Not on file  . Alcohol Use: No  . Drug Use: No  . Sexually Active: Not on file   Other Topics Concern  . Not on file   Social History Narrative  . No narrative on file    Current Outpatient Prescriptions on File Prior to Visit  Medication Sig Dispense Refill  . ACIPHEX 20 MG tablet TAKE 1 TABLET BY MOUTH EVERY DAY  30 tablet  2  . atorvastatin (LIPITOR) 40 MG tablet TAKE 1 TABLET BY MOUTH DAILY  30 tablet  5  . B-D ULTRAFINE III SHORT PEN 31G X 8 MM MISC USE 4 TIMES DAILY AS DIRECTED  100 each  3  . DIOVAN 320 MG tablet TAKE 1 TABLET BY MOUTH EVERY DAY  30 tablet  1  . gabapentin (NEURONTIN) 300 MG capsule TAKE ONE CAPSULE BY MOUTH TWICE A DAY  60 capsule  4  . glucose blood (ONE TOUCH  ULTRA TEST) test strip Use as directed twice daily dx 250.00  100 each  3  . polyethylene glycol powder (GLYCOLAX/MIRALAX) powder USE ONE SCOOP AS DIRECTED BY MD 1-2 TIMES A DAY  527 g  1  . valACYclovir (VALTREX) 500 MG tablet TAKE 1 TABLET (500 MG TOTAL) BY MOUTH 2 (TWO) TIMES DAILY.  30 tablet  5    Allergies  Allergen Reactions  . Nsaids Nausea And Vomiting    Cannot tolerate large doses     Family History  Problem Relation Age of Onset  . Colon cancer Sister   . Diabetes Sister   . Diabetes Brother   . Heart disease Father   . Other Father     2 collapsed lungs    BP 146/82  Pulse 75  Temp 98.1 F (36.7 C) (Oral)  Wt 255 lb (115.667 kg)  SpO2 98%     Review of Systems  Constitutional: Negative for fever and unexpected weight change.  HENT: Negative for hearing loss.   Eyes: Negative for visual disturbance.  Respiratory: Negative for shortness of breath.   Cardiovascular: Negative for chest pain.  Gastrointestinal: Negative for anal bleeding.       Heartburn persists--she will see GI next week  Genitourinary: Negative for hematuria.  Musculoskeletal: Positive for back pain.  Skin: Negative for rash.  Neurological: Negative for headaches.  Psychiatric/Behavioral:       She has depression due to several family member deaths and illnesses       Objective:   Physical Exam VS: see vs page GEN: no distress HEAD: head: no deformity eyes: no periorbital swelling, no proptosis external nose and ears are normal mouth: no lesion seen NECK: supple, thyroid is not enlarged CHEST WALL: no deformity LUNGS:  Clear to auscultation BREASTS:  sees gyn CV: reg rate and rhythm, no murmur.   ABD: abdomen is soft, nontender.  no hepatosplenomegaly.  not distended.  no hernia GENITALIA/RECTAL: sees gyn.   MUSCULOSKELETAL: muscle bulk and strength are grossly normal.  no obvious joint swelling.  gait is normal and steady PULSES: dorsalis pedis intact bilat.  no carotid  bruit NEURO:  cn 2-12 grossly intact.   readily moves all 4's.  sensation is intact to touch on the feet SKIN:  Normal texture and temperature.  No rash or suspicious lesion is visible.   NODES:  None palpable at the neck PSYCH: alert, oriented x3.  Does not appear anxious nor depressed.       Assessment & Plan:  Wellness visit today, with problems stable, except as noted.   SEPARATE EVALUATION FOLLOWS--EACH PROBLEM HERE IS NEW, NOT RESPONDING TO TREATMENT, OR POSES SIGNIFICANT RISK TO THE PATIENT'S HEALTH: HISTORY OF THE PRESENT ILLNESS: Pt returns for f/u of IDDM (dx'ed; complicated by autonomic neuropathy).  no cbg record, but states cbg's vary from 105-200.  It is in general higher as the day goes on.  She says family illnesses have compromised her ability to care for her DM.  no cbg record, but states cbg's vary from 95-200.  It is in general highest in am (higher than at hs, despite no hs-snack).  It is sometimes low after breakfast. HTN: she takes diovan as rx'ed. PAST MEDICAL HISTORY reviewed and up to date today REVIEW OF SYSTEMS: denies LOC PHYSICAL EXAMINATION: VITAL SIGNS:  See vs page GENERAL: no distress EXTEMITIES: no deformity.  no ulcer on the feet.  feet are of normal color and temp.  no edema LAB/XRAY RESULTS: Lab Results  Component Value Date   HGBA1C 9.0* 04/04/2012  IMPRESSION: DM, needs increased rx HTN, with probably situational component PLAN: See instruction page

## 2012-04-05 LAB — URINALYSIS, ROUTINE W REFLEX MICROSCOPIC
Bilirubin Urine: NEGATIVE
Glucose, UA: 250 mg/dL — AB
Hgb urine dipstick: NEGATIVE
Ketones, ur: NEGATIVE mg/dL
Leukocytes, UA: NEGATIVE
Nitrite: NEGATIVE
Protein, ur: NEGATIVE mg/dL
Specific Gravity, Urine: >1.03 — ABNORMAL HIGH (ref 1.005–1.030)
Urobilinogen, UA: 0.2 mg/dL (ref 0.0–1.0)
pH: 5.5 (ref 5.0–8.0)

## 2012-04-05 LAB — MICROALBUMIN / CREATININE URINE RATIO
Creatinine, Urine: 267.1 mg/dL
Microalb Creat Ratio: 3.6 mg/g (ref 0.0–30.0)
Microalb, Ur: 0.95 mg/dL (ref 0.00–1.89)

## 2012-04-07 ENCOUNTER — Other Ambulatory Visit: Payer: Self-pay | Admitting: Endocrinology

## 2012-04-09 ENCOUNTER — Encounter: Payer: Self-pay | Admitting: Gastroenterology

## 2012-04-09 ENCOUNTER — Ambulatory Visit (INDEPENDENT_AMBULATORY_CARE_PROVIDER_SITE_OTHER): Payer: 59 | Admitting: Gastroenterology

## 2012-04-09 ENCOUNTER — Other Ambulatory Visit: Payer: 59

## 2012-04-09 VITALS — BP 156/80 | HR 73 | Ht 65.0 in | Wt 256.0 lb

## 2012-04-09 DIAGNOSIS — K219 Gastro-esophageal reflux disease without esophagitis: Secondary | ICD-10-CM

## 2012-04-09 DIAGNOSIS — K589 Irritable bowel syndrome without diarrhea: Secondary | ICD-10-CM

## 2012-04-09 DIAGNOSIS — E119 Type 2 diabetes mellitus without complications: Secondary | ICD-10-CM

## 2012-04-09 DIAGNOSIS — K5901 Slow transit constipation: Secondary | ICD-10-CM

## 2012-04-09 DIAGNOSIS — F419 Anxiety disorder, unspecified: Secondary | ICD-10-CM

## 2012-04-09 DIAGNOSIS — F411 Generalized anxiety disorder: Secondary | ICD-10-CM

## 2012-04-09 MED ORDER — DEXLANSOPRAZOLE 60 MG PO CPDR: 60.0000 mg | DELAYED_RELEASE_CAPSULE | Freq: Every day | ORAL | Status: DC

## 2012-04-09 MED ORDER — HYOSCYAMINE SULFATE 0.125 MG SL SUBL: 0.1250 mg | SUBLINGUAL_TABLET | SUBLINGUAL | Status: DC | PRN

## 2012-04-09 NOTE — Progress Notes (Signed)
This is a 61 year old obese African American female with insulin-dependent diabetes.  She has chronic functional constipation managed with daily MiraLax.  She now complains of worsening acid reflux despite AcipHex 20 mg at bedtime.  Previous workups have suggested possible gastroparesis, but she is not on prokinetic agents.  Recent lab data by Dr. Loanne Drilling was all unremarkable without abnormal liver function tests.  She denies any current hepatobiliary complaints, but complains of early satiety, reflux symptoms, but no dysphagia.  She also has periodic spasmodic lower abdominal pain without melena or hematochezia.  She denies a specific food intolerances, anorexia or weight loss or systemic complaints.  Current Medications, Allergies, Past Medical History, Past Surgical History, Family History and Social History were reviewed in [employer] record.  Pertinent Review of Systems Negative... calyceal some mild peripheral neuropathy and is on Neurontin 300 mg twice a day.   Physical Exam: Healthy-appearing patient in no distress.  Blood pressure today 156/80, pulse 73, weight 256 the BMI of 42.60, 98% oxygen saturation.  Chest is clear she is in a regular rhythm without murmurs gallops or rubs.  There is no organomegaly, abdominal masses, tenderness, and bowel sounds are normal.  I cannot appreciate a succussion splash.  Mental status is normal    Assessment and Plan: Chronic functional constipation in a 61 year old insulin-dependent diabetic.  She is doing very well GI-wise on daily MiraLax.  She probably has an element of gastroparesis with worsening acid reflux, and I've schedule gastric emptying scan.  In the interim, I have changed her to Dexilant 60 mg each bedtime in place of AcipHex, and also when necessary sublingual Levsin for abdominal spasms.  She is up-to-date on her endoscopy and colonoscopy exams, has had previous negative upper abdominal ultrasound exam.  I did order B12  level a day for review.  She is continue other medications per primary care as listed.  She does suffer from rather severe obesity, but would be a poor candidate for bariatric surgery.  Patient relates that stress plays a large part in her gastrointestinal and medical symptomatology.  If her emptying scan is abnormal, we will prescribe prokinetic agents as tolerated. Encounter Diagnoses  Name Primary?  . GERD (gastroesophageal reflux disease) Yes  . IBS (irritable bowel syndrome)   . Abnormal liver function test

## 2012-04-09 NOTE — Patient Instructions (Addendum)
Please stop Aciphex and start Dexilant, one capsule by mouth thirty minutes before breakfast daily. Samples were given and please call us back if this works and we can send a prescription.  We have sent the following medications to your pharmacy for you to pick up at your convenience: Levsin. Please take as directed.  Your physician has requested that you go to the basement for the following lab work before leaving today: [place] have been scheduled for a gastric emptying scan at [facility] on 04-26-2012 at 1230 PM. Please arrive at least 15 minutes prior to your appointment for registration. Please make certain not to have anything to eat or drink after midnight the night before your test. Hold all stomach medications (ex: Zofran, phenergan, Reglan) 48 hours prior to your test. If you need to reschedule your appointment, please contact radiology scheduling at [phone]. _____________________________________________________________________ A gastric-emptying study measures how long it takes for food to move through your stomach. There are several ways to measure stomach emptying. In the most common test, you eat food that contains a small amount of radioactive material. A scanner that detects the movement of the radioactive material is placed over your abdomen to monitor the rate at which food leaves your stomach. This test normally takes about 2 hours to complete. _____________________________________________________________________

## 2012-04-10 LAB — CELIAC PANEL 10
Endomysial Screen: NEGATIVE
Gliadin IgA: 8.7 U/mL (ref <20)
Gliadin IgG: 21.3 U/mL — ABNORMAL HIGH (ref <20)
IgA: 362 mg/dL (ref 69–380)
Tissue Transglut Ab: 21.4 U/mL — ABNORMAL HIGH (ref <20)
Tissue Transglutaminase Ab, IgA: 5.2 U/mL (ref <20)

## 2012-04-11 ENCOUNTER — Telehealth: Payer: Self-pay | Admitting: *Deleted

## 2012-04-11 ENCOUNTER — Other Ambulatory Visit: Payer: Self-pay | Admitting: Endocrinology

## 2012-04-11 DIAGNOSIS — R6889 Other general symptoms and signs: Secondary | ICD-10-CM

## 2012-04-11 NOTE — Telephone Encounter (Signed)
Informed pt she needs to follow at Gluten Free Diet to see if s&s improve; mailed her a copy of the diet. Reviewed labs with pt and B12 was not drawn; she will come back for the lab.

## 2012-04-11 NOTE — Telephone Encounter (Signed)
lmom for pt to call back

## 2012-04-11 NOTE — Telephone Encounter (Signed)
Message copied by Lance Morin on Thu Apr 11, 2012 11:32 AM ------      Message from: Sharlett Iles, DAVID R      Created: Thu Apr 11, 2012  8:23 AM      Regarding: sca       Try on Gluten free diet

## 2012-04-18 ENCOUNTER — Encounter: Payer: Self-pay | Admitting: Endocrinology

## 2012-04-22 ENCOUNTER — Telehealth: Payer: Self-pay | Admitting: *Deleted

## 2012-04-22 NOTE — Telephone Encounter (Signed)
Patient wanted to make sure that she was stopping the correct stomach medications for her scan that is coming up.  Advised patient that she needs to hold all stomach medications she is on and I advised that I would not take Miralax because it could interfere with scan.  Patient verbalized understanding

## 2012-04-26 ENCOUNTER — Other Ambulatory Visit: Payer: Self-pay

## 2012-04-26 ENCOUNTER — Other Ambulatory Visit: Payer: Self-pay | Admitting: Endocrinology

## 2012-04-26 ENCOUNTER — Encounter (HOSPITAL_COMMUNITY)
Admission: RE | Admit: 2012-04-26 | Discharge: 2012-04-26 | Disposition: A | Discharge status: Home / Self Care | Payer: 59 | Source: Ambulatory Visit | Attending: Gastroenterology | Admitting: Gastroenterology

## 2012-04-26 DIAGNOSIS — K219 Gastro-esophageal reflux disease without esophagitis: Secondary | ICD-10-CM | POA: Insufficient documentation

## 2012-04-26 DIAGNOSIS — K589 Irritable bowel syndrome without diarrhea: Secondary | ICD-10-CM | POA: Insufficient documentation

## 2012-04-26 DIAGNOSIS — E538 Deficiency of other specified B group vitamins: Secondary | ICD-10-CM

## 2012-04-26 MED ORDER — TECHNETIUM TC 99M SULFUR COLLOID
2.0000 | Freq: Once | INTRAVENOUS | Status: AC | PRN
Start: 2012-04-26 — End: 2012-04-26
  Administered 2012-04-26: 2 via INTRAVENOUS

## 2012-04-29 ENCOUNTER — Other Ambulatory Visit: Payer: Self-pay | Admitting: Gastroenterology

## 2012-04-30 ENCOUNTER — Telehealth: Payer: Self-pay | Admitting: Endocrinology

## 2012-04-30 ENCOUNTER — Other Ambulatory Visit: Payer: Self-pay | Admitting: Gastroenterology

## 2012-04-30 LAB — VITAMIN B1: Vitamin B1 (Thiamine): 9 nmol/L (ref 8–30)

## 2012-04-30 MED ORDER — HYOSCYAMINE SULFATE 0.125 MG SL SUBL: 0.1250 mg | SUBLINGUAL_TABLET | SUBLINGUAL | Status: DC | PRN

## 2012-04-30 MED ORDER — DEXLANSOPRAZOLE 60 MG PO CPDR: 60.0000 mg | DELAYED_RELEASE_CAPSULE | Freq: Every day | ORAL | Status: DC

## 2012-04-30 NOTE — Telephone Encounter (Signed)
Medication sent   Patient notified

## 2012-04-30 NOTE — Telephone Encounter (Signed)
The patient called very concerned that she has a red blister like sore on her right breast that appeared overnight.  The patient is a patient of Dr. Loanne Drilling and when the patient called her gynecologist they stated she needed to call her PCP, so she called Dr. Loanne Drilling.  Please call the patient at [phone].

## 2012-04-30 NOTE — Telephone Encounter (Signed)
Called pt back: noticed a right breast blister (1 cm, heart-shaped, on the medial side of the breast, not bleeding, with a reddish shadow around it). On Christmas eve, she lifted a pot from the stove and the steam might have caused this, she thinks, but she did not see it until this am.  She was recently dx with celiac ds. (2.5 weeks ago) and started Dexilant (Deslansoprazole) and Hyoscyamine. I doubt this is a drug rxn, but I advised the pt to put Neosporin on it today and then let it dry and, if it gets worse or gets more blisters, to stop the 2 new medicines and call Dr Buel Ream office to tell him about it.

## 2012-05-08 ENCOUNTER — Other Ambulatory Visit: Payer: Self-pay

## 2012-05-08 MED ORDER — ATORVASTATIN CALCIUM 40 MG PO TABS: 40.0000 mg | ORAL_TABLET | Freq: Every day | ORAL | Status: DC

## 2012-06-23 ENCOUNTER — Other Ambulatory Visit: Payer: Self-pay | Admitting: Gastroenterology

## 2012-06-24 ENCOUNTER — Other Ambulatory Visit: Payer: Self-pay | Admitting: *Deleted

## 2012-06-24 MED ORDER — GABAPENTIN 300 MG PO CAPS: ORAL_CAPSULE | ORAL | Status: DC

## 2012-07-03 ENCOUNTER — Ambulatory Visit: Payer: 59 | Admitting: Endocrinology

## 2012-07-09 ENCOUNTER — Ambulatory Visit: Payer: 59 | Admitting: Endocrinology

## 2012-07-22 ENCOUNTER — Telehealth: Payer: Self-pay | Admitting: Endocrinology

## 2012-07-22 ENCOUNTER — Other Ambulatory Visit: Payer: Self-pay

## 2012-07-22 MED ORDER — INSULIN LISPRO PROT & LISPRO (75-25 MIX) 100 UNIT/ML ~~LOC~~ SUSP: 75.0000 [IU] | SUBCUTANEOUS | Status: DC

## 2012-07-22 NOTE — Telephone Encounter (Signed)
Please call in refill for insulin, pt wants this done today. Uses [facility] / [phone] / Venida Jarvis

## 2012-08-15 ENCOUNTER — Other Ambulatory Visit: Payer: Self-pay | Admitting: Obstetrics and Gynecology

## 2012-08-22 ENCOUNTER — Other Ambulatory Visit: Payer: Self-pay | Admitting: Gastroenterology

## 2012-09-19 ENCOUNTER — Telehealth: Payer: Self-pay | Admitting: Gastroenterology

## 2012-09-19 NOTE — Telephone Encounter (Signed)
I called patient back and patient said that she cannot afford Dexilant. Patient stated last year she was given a coupon for Dexilant to help with cost. I advised patient that I do have a savings coupon for Dexilant. Patient said that she wanted it mailed to her house. I advised patient that I will mail it to her and any problems to call me back.

## 2012-09-23 ENCOUNTER — Other Ambulatory Visit: Payer: Self-pay | Admitting: Gastroenterology

## 2012-09-24 ENCOUNTER — Other Ambulatory Visit: Payer: Self-pay

## 2012-09-24 MED ORDER — VALACYCLOVIR HCL 500 MG PO TABS: 500.0000 mg | ORAL_TABLET | Freq: Every day | ORAL | Status: DC

## 2012-09-24 NOTE — Telephone Encounter (Signed)
Ov is due 

## 2012-10-01 NOTE — Telephone Encounter (Signed)
LM for pt to call. Re: pt needs follow up appt w/ Dr. Loanne Drilling / Venida Jarvis

## 2012-10-02 ENCOUNTER — Other Ambulatory Visit: Payer: Self-pay

## 2012-10-02 MED ORDER — INSULIN PEN NEEDLE 31G X 8 MM MISC: Status: DC

## 2012-10-07 NOTE — Telephone Encounter (Signed)
Called again, spoke w. Adult female. Pt is not avail. I will call again later / Gayleen Orem,

## 2012-10-14 ENCOUNTER — Other Ambulatory Visit: Payer: Self-pay | Admitting: *Deleted

## 2012-10-14 MED ORDER — GABAPENTIN 300 MG PO CAPS: ORAL_CAPSULE | ORAL | Status: DC

## 2012-10-21 ENCOUNTER — Other Ambulatory Visit: Payer: Self-pay | Admitting: Gastroenterology

## 2012-10-28 NOTE — Telephone Encounter (Signed)
Called again, spoke w/ adult female. Asked pt to call me. This is 3rd attempt to reach patient. Will await return from patient. No further efforts will be made to reach pt / Sherri S.

## 2012-10-29 ENCOUNTER — Other Ambulatory Visit: Payer: Self-pay | Admitting: *Deleted

## 2012-10-29 MED ORDER — INSULIN LISPRO PROT & LISPRO (75-25 MIX) 100 UNIT/ML KWIKPEN: PEN_INJECTOR | SUBCUTANEOUS | Status: DC

## 2012-11-04 ENCOUNTER — Other Ambulatory Visit: Payer: Self-pay | Admitting: *Deleted

## 2012-11-04 MED ORDER — VALSARTAN 320 MG PO TABS: 320.0000 mg | ORAL_TABLET | Freq: Every day | ORAL | Status: DC

## 2012-11-13 ENCOUNTER — Other Ambulatory Visit: Payer: Self-pay | Admitting: *Deleted

## 2012-11-13 ENCOUNTER — Ambulatory Visit (INDEPENDENT_AMBULATORY_CARE_PROVIDER_SITE_OTHER): Payer: 59 | Admitting: Endocrinology

## 2012-11-13 ENCOUNTER — Encounter: Payer: Self-pay | Admitting: Endocrinology

## 2012-11-13 VITALS — BP 126/68 | HR 78 | Temp 98.6°F | Resp 12 | Wt 250.0 lb

## 2012-11-13 DIAGNOSIS — I10 Essential (primary) hypertension: Secondary | ICD-10-CM

## 2012-11-13 DIAGNOSIS — Z79899 Other long term (current) drug therapy: Secondary | ICD-10-CM

## 2012-11-13 DIAGNOSIS — E119 Type 2 diabetes mellitus without complications: Secondary | ICD-10-CM

## 2012-11-13 DIAGNOSIS — E7800 Pure hypercholesterolemia, unspecified: Secondary | ICD-10-CM

## 2012-11-13 DIAGNOSIS — D509 Iron deficiency anemia, unspecified: Secondary | ICD-10-CM

## 2012-11-13 DIAGNOSIS — Z0000 Encounter for general adult medical examination without abnormal findings: Secondary | ICD-10-CM

## 2012-11-13 DIAGNOSIS — E538 Deficiency of other specified B group vitamins: Secondary | ICD-10-CM

## 2012-11-13 LAB — URINALYSIS, ROUTINE W REFLEX MICROSCOPIC
Bilirubin Urine: NEGATIVE
Hgb urine dipstick: NEGATIVE
Ketones, ur: NEGATIVE
Leukocytes, UA: NEGATIVE
Nitrite: NEGATIVE
RBC / HPF: NONE SEEN (ref 0–?)
Specific Gravity, Urine: >=1.03 (ref 1.000–1.030)
Total Protein, Urine: NEGATIVE
Urine Glucose: 500
Urobilinogen, UA: 0.2 (ref 0.0–1.0)
pH: 5.5 (ref 5.0–8.0)

## 2012-11-13 LAB — CBC WITH DIFFERENTIAL/PLATELET
Basophils Absolute: 0 10*3/uL (ref 0.0–0.1)
Basophils Relative: 0.6 % (ref 0.0–3.0)
Eosinophils Absolute: 0.1 10*3/uL (ref 0.0–0.7)
Eosinophils Relative: 4.1 % (ref 0.0–5.0)
HCT: 42.5 % (ref 36.0–46.0)
Hemoglobin: 14.5 g/dL (ref 12.0–15.0)
Lymphocytes Relative: 36.4 % (ref 12.0–46.0)
Lymphs Abs: 1.3 10*3/uL (ref 0.7–4.0)
MCHC: 34 g/dL (ref 30.0–36.0)
MCV: 96.6 fl (ref 78.0–100.0)
Monocytes Absolute: 0.3 10*3/uL (ref 0.1–1.0)
Monocytes Relative: 7.8 % (ref 3.0–12.0)
Neutro Abs: 1.8 10*3/uL (ref 1.4–7.7)
Neutrophils Relative %: 51.1 % (ref 43.0–77.0)
Platelets: 244 10*3/uL (ref 150.0–400.0)
RBC: 4.4 Mil/uL (ref 3.87–5.11)
RDW: 12.9 % (ref 11.5–14.6)
WBC: 3.6 10*3/uL — ABNORMAL LOW (ref 4.5–10.5)

## 2012-11-13 LAB — LIPID PANEL
Cholesterol: 132 mg/dL (ref 0–200)
HDL: 42.6 mg/dL (ref >39.00)
LDL Cholesterol: 79 mg/dL (ref 0–99)
Total CHOL/HDL Ratio: 3
Triglycerides: 53 mg/dL (ref 0.0–149.0)
VLDL: 10.6 mg/dL (ref 0.0–40.0)

## 2012-11-13 LAB — HEPATIC FUNCTION PANEL
ALT: 26 U/L (ref 0–35)
AST: 18 U/L (ref 0–37)
Albumin: 3.9 g/dL (ref 3.5–5.2)
Alkaline Phosphatase: 89 U/L (ref 39–117)
Bilirubin, Direct: 0 mg/dL (ref 0.0–0.3)
Total Bilirubin: 0.6 mg/dL (ref 0.3–1.2)
Total Protein: 7.2 g/dL (ref 6.0–8.3)

## 2012-11-13 LAB — TSH: TSH: 1 u[IU]/mL (ref 0.35–5.50)

## 2012-11-13 LAB — HEMOGLOBIN A1C: Hgb A1c MFr Bld: 8.7 % — ABNORMAL HIGH (ref 4.6–6.5)

## 2012-11-13 MED ORDER — INSULIN PEN NEEDLE 31G X 8 MM MISC: Status: DC

## 2012-11-13 MED ORDER — GLUCOSE BLOOD VI STRP: ORAL_STRIP | Status: DC

## 2012-11-13 MED ORDER — INSULIN LISPRO PROT & LISPRO (75-25 MIX) 100 UNIT/ML KWIKPEN: PEN_INJECTOR | SUBCUTANEOUS | Status: DC

## 2012-11-13 NOTE — Progress Notes (Signed)
Subjective:    Patient ID: Jessica [name], female    DOB: Aug 19, 1950, 62 y.o.   MRN: [id number]  HPI Pt returns for f/u of IDDM (dx'ed 2000; she has mild if any neuropathy of the lower extremities; she has associated autonomic neuropathy; she needs the simpler bid insulin regimen).  She does not check cbg's, due to family responsibilities.  She denies hypoglycemia. Past Medical History  Diagnosis Date  . Asthma     childhood  . Hemorrhoids   . Autonomic neuropathy     diabetic  . Hypertension   . Gastroparesis   . Unspecified gastritis and gastroduodenitis without mention of hemorrhage   . Personal history of colonic polyps 03/2010    hyperplastic  . Chest pain     intermittant left sided chest pain, non-exertional, lasting 1-2 min  . Nasal congestion current    sore throat, ears-on antibiotic  . PONV (postoperative nausea and vomiting)   . Diabetes mellitus     type II, Hemoglobin A1C 9.9 10/05/2011    Past Surgical History  Procedure Laterality Date  . Tubal ligation    . Appendectomy    . Left breast cyst removal  2000    History   Social History  . Marital Status: Married    Spouse Name: Devar Shams    Number of Children: 2  . Years of Education: N/A   Occupational History  . works for school system    Social History Main Topics  . Smoking status: Never Smoker   . Smokeless tobacco: Never Used  . Alcohol Use: No  . Drug Use: No  . Sexually Active: Not on file   Other Topics Concern  . Not on file   Social History Narrative  . No narrative on file    Current Outpatient Prescriptions on File Prior to Visit  Medication Sig Dispense Refill  . atorvastatin (LIPITOR) 40 MG tablet Take 1 tablet (40 mg total) by mouth daily.  30 tablet  5  . dexlansoprazole (DEXILANT) 60 MG capsule Take 1 capsule (60 mg total) by mouth daily.  30 capsule  6  . gabapentin (NEURONTIN) 300 MG capsule TAKE ONE CAPSULE BY MOUTH TWICE A DAY  60 capsule  2  . hyoscyamine (LEVSIN  SL) 0.125 MG SL tablet PLACE 1 TABLET UNDER THE TONGUE EVERY 4 HOURS AS NEEDED FOR CRAMPING.  30 tablet  1  . polyethylene glycol powder (GLYCOLAX/MIRALAX) powder USE 1 SCOOP AS DIRECTED 1-2 TIMES DAILY  527 g  1  . valACYclovir (VALTREX) 500 MG tablet Take 1 tablet (500 mg total) by mouth daily.  60 tablet  3  . valsartan (DIOVAN) 320 MG tablet Take 1 tablet (320 mg total) by mouth daily.  30 tablet  2   No current facility-administered medications on file prior to visit.    Allergies  Allergen Reactions  . Nsaids Nausea And Vomiting    Cannot tolerate large doses     Family History  Problem Relation Age of Onset  . Colon cancer Sister   . Diabetes Sister   . Diabetes Brother   . Heart disease Father   . Other Father     2 collapsed lungs    BP 126/68  Pulse 78  Temp(Src) 98.6 F (37 C) (Oral)  Resp 12  Wt 250 lb (113.399 kg)  BMI 41.6 kg/m2  SpO2 96%    Review of Systems She has anxiety due to her sister's recent death. No weight change  Objective:   Physical Exam VITAL SIGNS:  See vs page GENERAL: no distress   Lab Results  Component Value Date   TSH 1.00 11/13/2012   Lab Results  Component Value Date   HGBA1C 8.7* 11/13/2012      Assessment & Plan:  DM: This insulin regimen was chosen from multiple options, for its simplicity.  She needs increased rx.  The benefits of glycemic control must be weighed against the risks of hypoglycemia.  therapy limited by noncompliance with cbg monitoring.  i'll do the best i can.

## 2012-11-13 NOTE — Patient Instructions (Addendum)
blood tests are being requested for you today.  We'll contact you with results. check your blood sugar twice a day.  vary the time of day when you check, between before the 3 meals, and at bedtime.  also check if you have symptoms of your blood sugar being too high or too low.  please keep a record of the readings and bring it to your next appointment here.  please call us sooner if your blood sugar goes below 70, or if you have a lot of readings over 200. Please come back for a follow-up appointment in 3 months.

## 2012-11-19 ENCOUNTER — Ambulatory Visit: Payer: 59 | Admitting: Endocrinology

## 2012-12-02 ENCOUNTER — Other Ambulatory Visit: Payer: Self-pay | Admitting: *Deleted

## 2012-12-02 MED ORDER — ATORVASTATIN CALCIUM 40 MG PO TABS: 40.0000 mg | ORAL_TABLET | Freq: Every day | ORAL | Status: DC

## 2012-12-04 ENCOUNTER — Other Ambulatory Visit: Payer: Self-pay

## 2012-12-04 MED ORDER — INSULIN LISPRO PROT & LISPRO (75-25 MIX) 100 UNIT/ML KWIKPEN: 75.0000 [IU] | PEN_INJECTOR | Freq: Two times a day (BID) | SUBCUTANEOUS | Status: DC

## 2012-12-17 ENCOUNTER — Other Ambulatory Visit: Payer: Self-pay | Admitting: Gastroenterology

## 2012-12-19 ENCOUNTER — Telehealth: Payer: Self-pay | Admitting: *Deleted

## 2012-12-19 NOTE — Telephone Encounter (Signed)
Ov tomorrow 

## 2012-12-19 NOTE — Telephone Encounter (Signed)
Pt called with serious low back pain that radiates down her legs front and back, this has been going on for 3 weeks, she's used ice and heat, she has been taking advil, nothing is working. She wants to know what can be done, she's not sure if she needs to see you or if she needs a referral to an orthopedic Dr.?

## 2012-12-19 NOTE — Telephone Encounter (Signed)
Pt scheduled for 2:45 Friday

## 2012-12-20 ENCOUNTER — Ambulatory Visit
Admission: RE | Admit: 2012-12-20 | Discharge: 2012-12-20 | Disposition: A | Discharge status: Home / Self Care | Payer: 59 | Source: Ambulatory Visit | Attending: Endocrinology | Admitting: Endocrinology

## 2012-12-20 ENCOUNTER — Ambulatory Visit (INDEPENDENT_AMBULATORY_CARE_PROVIDER_SITE_OTHER): Payer: 59 | Admitting: Endocrinology

## 2012-12-20 ENCOUNTER — Encounter: Payer: Self-pay | Admitting: Endocrinology

## 2012-12-20 VITALS — BP 128/74 | HR 80 | Ht 65.0 in | Wt 254.0 lb

## 2012-12-20 DIAGNOSIS — M545 Low back pain, unspecified: Secondary | ICD-10-CM

## 2012-12-20 LAB — SEDIMENTATION RATE: Sed Rate: 12 mm/hr (ref 0–22)

## 2012-12-20 MED ORDER — HYDROCODONE-ACETAMINOPHEN 10-325 MG PO TABS: 1.0000 | ORAL_TABLET | Freq: Four times a day (QID) | ORAL | Status: DC | PRN

## 2012-12-20 NOTE — Patient Instructions (Addendum)
blood tests and x-rays are being requested for you today.  We'll contact you with results. I hope you feel better soon.  If you don't feel better by next week, please call back.   Here is a prescription for pain medication.

## 2012-12-20 NOTE — Progress Notes (Signed)
Subjective:    Patient ID: Jessica [name], female    DOB: Jan 18, 1951, 62 y.o.   MRN: [id number]  HPI Pt states 3 weeks of severe pain at the lower back, rad to the lateraal aspect of the right leg.  No assoc numbness. Pt returns for f/u of IDDM (dx'ed 2000; she has mild if any neuropathy of the lower extremities; she has associated autonomic neuropathy; she needs the simpler bid insulin regimen).  She denies hypoglycemia.  She says cbg's have improved on increased insulin.   She says she cannot tolerate NSAID, due to GI upset. Past Medical History  Diagnosis Date  . Asthma     childhood  . Hemorrhoids   . Autonomic neuropathy     diabetic  . Hypertension   . Gastroparesis   . Unspecified gastritis and gastroduodenitis without mention of hemorrhage   . Personal history of colonic polyps 03/2010    hyperplastic  . Chest pain     intermittant left sided chest pain, non-exertional, lasting 1-2 min  . Nasal congestion current    sore throat, ears-on antibiotic  . PONV (postoperative nausea and vomiting)   . Diabetes mellitus     type II, Hemoglobin A1C 9.9 10/05/2011    Past Surgical History  Procedure Laterality Date  . Tubal ligation    . Appendectomy    . Left breast cyst removal  2000    History   Social History  . Marital Status: Married    Spouse Name: Devar Mosher    Number of Children: 2  . Years of Education: N/A   Occupational History  . works for school system    Social History Main Topics  . Smoking status: Never Smoker   . Smokeless tobacco: Never Used  . Alcohol Use: No  . Drug Use: No  . Sexual Activity: Not on file   Other Topics Concern  . Not on file   Social History Narrative  . No narrative on file    Current Outpatient Prescriptions on File Prior to Visit  Medication Sig Dispense Refill  . atorvastatin (LIPITOR) 40 MG tablet Take 1 tablet (40 mg total) by mouth daily.  30 tablet  5  . dexlansoprazole (DEXILANT) 60 MG capsule Take 1  capsule (60 mg total) by mouth daily.  30 capsule  6  . gabapentin (NEURONTIN) 300 MG capsule TAKE ONE CAPSULE BY MOUTH TWICE A DAY  60 capsule  2  . glucose blood (ONE TOUCH ULTRA TEST) test strip Use as directed twice daily dx 250.00  100 each  3  . hyoscyamine (LEVSIN SL) 0.125 MG SL tablet PLACE 1 TABLET UNDER THE TONGUE EVERY 4 HOURS AS NEEDED FOR CRAMPING.  30 tablet  1  . Insulin Lispro Prot & Lispro (HUMALOG MIX 75/25 KWIKPEN) (75-25) 100 UNIT/ML SUPN Inject 75 Units into the skin 2 (two) times daily at 8 am and 10 pm. 75 units with breakfast, and 85 units with the evening meal  48 pen  3  . Insulin Pen Needle (PC UNIFINE PENTIPS) 31G X 8 MM MISC Use as directed 4 times daily.  130 each  3  . polyethylene glycol powder (GLYCOLAX/MIRALAX) powder USE 1 SCOOP AS DIRECTED 1-2 TIMES DAILY  527 g  1  . valACYclovir (VALTREX) 500 MG tablet Take 1 tablet (500 mg total) by mouth daily.  60 tablet  3  . valsartan (DIOVAN) 320 MG tablet Take 1 tablet (320 mg total) by mouth daily.  30 tablet  2   No current facility-administered medications on file prior to visit.    Allergies  Allergen Reactions  . Nsaids Nausea And Vomiting    Cannot tolerate large doses     Family History  Problem Relation Age of Onset  . Colon cancer Sister   . Diabetes Sister   . Diabetes Brother   . Heart disease Father   . Other Father     2 collapsed lungs    BP 128/74  Pulse 80  Ht 5' 5"  (1.651 m)  Wt 254 lb (115.214 kg)  BMI 42.27 kg/m2  SpO2 98%  Review of Systems Denies bowel or bladder retention.      Objective:   Physical Exam VITAL SIGNS:  See vs page GENERAL: no distress Spine: nontender Neuro: sensation is intact to touch on the feet Gait: favors RLE     Assessment & Plan:  Radicular pain, new Gi upset due to NSAID DM: this is a relative contraindication to steroids.

## 2012-12-24 ENCOUNTER — Other Ambulatory Visit: Payer: Self-pay | Admitting: Gastroenterology

## 2012-12-26 ENCOUNTER — Other Ambulatory Visit: Payer: Self-pay | Admitting: Gastroenterology

## 2013-01-30 ENCOUNTER — Telehealth: Payer: Self-pay | Admitting: *Deleted

## 2013-01-31 NOTE — Telephone Encounter (Signed)
done

## 2013-02-03 ENCOUNTER — Other Ambulatory Visit: Payer: Self-pay | Admitting: *Deleted

## 2013-02-03 MED ORDER — GABAPENTIN 300 MG PO CAPS: ORAL_CAPSULE | ORAL | Status: DC

## 2013-02-07 ENCOUNTER — Other Ambulatory Visit: Payer: Self-pay | Admitting: *Deleted

## 2013-02-07 MED ORDER — VALSARTAN 320 MG PO TABS: 320.0000 mg | ORAL_TABLET | Freq: Every day | ORAL | Status: DC

## 2013-02-13 ENCOUNTER — Ambulatory Visit (INDEPENDENT_AMBULATORY_CARE_PROVIDER_SITE_OTHER): Payer: 59 | Admitting: Endocrinology

## 2013-02-13 ENCOUNTER — Encounter: Payer: Self-pay | Admitting: Endocrinology

## 2013-02-13 VITALS — BP 134/80 | HR 80 | Wt 255.0 lb

## 2013-02-13 DIAGNOSIS — M545 Low back pain, unspecified: Secondary | ICD-10-CM

## 2013-02-13 DIAGNOSIS — E119 Type 2 diabetes mellitus without complications: Secondary | ICD-10-CM

## 2013-02-13 LAB — HEMOGLOBIN A1C: Hgb A1c MFr Bld: 7.9 % — ABNORMAL HIGH (ref 4.6–6.5)

## 2013-02-13 MED ORDER — FLUTICASONE PROPIONATE 50 MCG/ACT NA SUSP: 2.0000 | Freq: Every day | NASAL | Status: DC

## 2013-02-13 NOTE — Patient Instructions (Addendum)
Please come back for a regular physical appointment in 3 months.  blood tests are being requested for you today.  We'll contact you with results.  check your blood sugar twice a day.  vary the time of day when you check, between before the 3 meals, and at bedtime.  also check if you have symptoms of your blood sugar being too high or too low.  please keep a record of the readings and bring it to your next appointment here.  You can write it on any piece of paper.  please call us sooner if your blood sugar goes below 70, or if you have a lot of readings over 200.   Refer to a specialist for your back pain.  you will receive a phone call, about a day and time for an appointment. Loratadine-d (non-prescription) will help your congestion.

## 2013-02-13 NOTE — Progress Notes (Signed)
Subjective:    Patient ID: Jessica [name], female    DOB: October 02, 1950, 62 y.o.   MRN: [id number]  HPI Pt returns for f/u of IDDM (dx'ed 2000, when she presented with monilial vaginitis; she has mild if any neuropathy of the lower extremities; she has associated autonomic neuropathy; she needs the simpler bid insulin regimen; she has never had severe hypoglycemia or DKA).  She has hypoglycemia only when she misses a meal.  Otherwise, cbg's are well-controlled.  It is in general higher as the day goes on.  no cbg record, but she says it varies from 75-mid-100's.   Low-back pain persists.  She has nasal congestion and runny nose.  Past Medical History  Diagnosis Date  . Asthma     childhood  . Hemorrhoids   . Autonomic neuropathy     diabetic  . Hypertension   . Gastroparesis   . Unspecified gastritis and gastroduodenitis without mention of hemorrhage   . Personal history of colonic polyps 03/2010    hyperplastic  . Chest pain     intermittant left sided chest pain, non-exertional, lasting 1-2 min  . Nasal congestion current    sore throat, ears-on antibiotic  . PONV (postoperative nausea and vomiting)   . Diabetes mellitus     type II, Hemoglobin A1C 9.9 10/05/2011    Past Surgical History  Procedure Laterality Date  . Tubal ligation    . Appendectomy    . Left breast cyst removal  2000    History   Social History  . Marital Status: Married    Spouse Name: Devar Earll    Number of Children: 2  . Years of Education: N/A   Occupational History  . works for school system    Social History Main Topics  . Smoking status: Never Smoker   . Smokeless tobacco: Never Used  . Alcohol Use: No  . Drug Use: No  . Sexual Activity: Not on file   Other Topics Concern  . Not on file   Social History Narrative  . No narrative on file    Current Outpatient Prescriptions on File Prior to Visit  Medication Sig Dispense Refill  . atorvastatin (LIPITOR) 40 MG tablet Take 1 tablet  (40 mg total) by mouth daily.  30 tablet  5  . DEXILANT 60 MG capsule TAKE ONE CAPSULE BY MOUTH EVERY DAY  30 capsule  6  . gabapentin (NEURONTIN) 300 MG capsule TAKE ONE CAPSULE BY MOUTH TWICE A DAY  60 capsule  2  . glucose blood (ONE TOUCH ULTRA TEST) test strip Use as directed twice daily dx 250.00  100 each  3  . HYDROcodone-acetaminophen (NORCO) 10-325 MG per tablet Take 1 tablet by mouth every 6 (six) hours as needed for pain.  30 tablet  0  . hyoscyamine (LEVSIN SL) 0.125 MG SL tablet PLACE 1 TABLET UNDER THE TONGUE EVERY 4 HOURS AS NEEDED FOR CRAMPING.  30 tablet  1  . Insulin Pen Needle (PC UNIFINE PENTIPS) 31G X 8 MM MISC Use as directed 4 times daily.  130 each  3  . polyethylene glycol powder (GLYCOLAX/MIRALAX) powder USE 1 SCOOP AS DIRECTED 1-2 TIMES DAILY  527 g  1  . valACYclovir (VALTREX) 500 MG tablet Take 1 tablet (500 mg total) by mouth daily.  60 tablet  3  . valsartan (DIOVAN) 320 MG tablet Take 1 tablet (320 mg total) by mouth daily.  30 tablet  2   No current facility-administered medications  on file prior to visit.    Allergies  Allergen Reactions  . Nsaids Nausea And Vomiting    Cannot tolerate large doses     Family History  Problem Relation Age of Onset  . Colon cancer Sister   . Diabetes Sister   . Diabetes Brother   . Heart disease Father   . Other Father     2 collapsed lungs    BP 134/80  Pulse 80  Wt 255 lb (115.667 kg)  BMI 42.43 kg/m2  SpO2 96%  Review of Systems Denies LOC and weight change.    Objective:   Physical Exam VITAL SIGNS:  See vs page GENERAL: no distress  Lab Results  Component Value Date   HGBA1C 7.9* 02/13/2013      Assessment & Plan:  DM: This insulin regimen was chosen from multiple options, for its simplicity.  She needs increased rx.  The benefits of glycemic control must be weighed against the risks of hypoglycemia.  therapy limited by noncompliance with cbg monitoring.  i'll do the best i can.  This insulin  regimen was chosen from multiple options, for its simplicity.   Back pain, persistent allergic rhinitis, new

## 2013-02-18 ENCOUNTER — Encounter: Payer: Self-pay | Admitting: Gastroenterology

## 2013-02-18 ENCOUNTER — Other Ambulatory Visit: Payer: 59

## 2013-02-18 ENCOUNTER — Ambulatory Visit (INDEPENDENT_AMBULATORY_CARE_PROVIDER_SITE_OTHER): Payer: 59 | Admitting: Gastroenterology

## 2013-02-18 VITALS — BP 138/72 | HR 92 | Ht 65.0 in | Wt 254.6 lb

## 2013-02-18 DIAGNOSIS — F4311 Post-traumatic stress disorder, acute: Secondary | ICD-10-CM

## 2013-02-18 DIAGNOSIS — K219 Gastro-esophageal reflux disease without esophagitis: Secondary | ICD-10-CM

## 2013-02-18 DIAGNOSIS — F4310 Post-traumatic stress disorder, unspecified: Secondary | ICD-10-CM

## 2013-02-18 DIAGNOSIS — K589 Irritable bowel syndrome without diarrhea: Secondary | ICD-10-CM

## 2013-02-18 MED ORDER — SUCRALFATE 1 GM/10ML PO SUSP: 1.0000 g | ORAL | Status: DC

## 2013-02-18 MED ORDER — CILIDINIUM-CHLORDIAZEPOXIDE 2.5-5 MG PO CAPS: 1.0000 | ORAL_CAPSULE | Freq: Three times a day (TID) | ORAL | Status: DC | PRN

## 2013-02-18 NOTE — Patient Instructions (Signed)
We have sent the following medications to your pharmacy for you to pick up at your convenience: Librax and [place]  Your physician has requested that you go to the basement for the following lab work before leaving today: Celiac  CC:  Renato Shin MD

## 2013-02-18 NOTE — Progress Notes (Signed)
This is a 62 year old African American female with insulin-dependent diabetes, suspected gluten intolerance, GERD, and IBS.  All of her symptoms have been made worse recently by the death of her sister.  She complains of constant burning or esophageal area and has not been on antibiotics.  There is no dysphagia or specific hepatobiliary or lower gastrointestinal problems.  She does have cramping and gas with all meals.  She denies abuse of alcohol, NSAIDs, or cigarettes.  There is been no anorexia, weight loss, or other systemic or hepatobiliary problems.  Current Medications, Allergies, Past Medical History, Past Surgical History, Family History and Social History were reviewed in [employer] record.  ROS: All systems were reviewed and are negative unless otherwise stated in the HPI.          Physical Exam: Healthy-appearing patient in no distress.  Blood pressure 130/72, pulse 92 and regular weight 254 with a BMI of 42.37.  Examination oral pharyngeal area is unremarkable as is the neck.  Chest is clear and she is in a regular rhythm without murmurs gallops or rubs there is no hepatosplenomegaly, abdominal masses or tenderness.  Bowel sounds are normal.  Extremities are unremarkable and mental status is normal.    Assessment and Plan: Stress related worsening of her GI chronic complaints.  I have added Carafate suspension to her daily Dexilant, stopped Levsin and we'll try Librax one by mouth 3 times a day before meals, repeat her celiac serologies, and continue her other medications as listed and reviewed.  Previous gallbladder evaluation negative.  Gastric emptying scan was normal in  January of this year.  Ultrasound exams showing no gallstones but evidence of fatty infiltration of the liver.

## 2013-02-19 ENCOUNTER — Ambulatory Visit: Payer: 59 | Admitting: Family Medicine

## 2013-02-19 LAB — CELIAC PANEL 10
Endomysial Screen: NEGATIVE
Gliadin IgA: 9.7 U/mL (ref <20)
Gliadin IgG: 34.7 U/mL — ABNORMAL HIGH (ref <20)
IgA: 352 mg/dL (ref 69–380)
Tissue Transglut Ab: 18.1 U/mL (ref <20)
Tissue Transglutaminase Ab, IgA: 5.1 U/mL (ref <20)

## 2013-02-20 ENCOUNTER — Encounter: Payer: Self-pay | Admitting: Family Medicine

## 2013-02-20 ENCOUNTER — Ambulatory Visit (INDEPENDENT_AMBULATORY_CARE_PROVIDER_SITE_OTHER): Payer: 59 | Admitting: Family Medicine

## 2013-02-20 VITALS — BP 148/82 | HR 79 | Ht 65.0 in | Wt 257.0 lb

## 2013-02-20 DIAGNOSIS — M545 Low back pain, unspecified: Secondary | ICD-10-CM

## 2013-02-20 DIAGNOSIS — M999 Biomechanical lesion, unspecified: Secondary | ICD-10-CM

## 2013-02-20 NOTE — Assessment & Plan Note (Signed)
Multifactorial in nature, I do think some of this is secondary to the family losses of her mother and sister this year. Patient also seems to be more stress. Patient does have some significant muscle imbalances with weakness of the hip abductors and tightness of the hip flexors. Patient did respond to date well to osteopathic manipulation. In addition to that the patient given home exercise program Discuss core strengthening and the importance of weight loss Discussed proper shoe wear Patient will come back again in 2 weeks. If she continues to respond well to osteopathic manipulation and will continue.

## 2013-02-20 NOTE — Progress Notes (Signed)
  I'm seeing this patient by the request  of:  Renato Shin, MD   CC: low back pain  HPI: Patient is a very pleasant 62 year old obese female Coming in with back pain for multiple months duration. Patient does not remember any true injury to the area. Patient states that most of this seems to be right-sided and the lower back with some mild radiculopathy down the lateral aspect of the leg. Patient states that there is no numbness or any weakness. Patient is still able to do all her activities of daily living but does have considerable amount pain. Patient states that his pain seems wake her up at night as well. She describes his pain as a dull aching sensation that can have sharp pain from time to time. Patient has tried some over-the-counter medications as well as changing sleeping position with minimal improvement.  X-rays are ordered previously were reviewed by me. X-rays show that patient does have some mild facet degenerative changes at the couple different levels in the lumbar spine as well as a grade 1 anterior listhesis of L4 on a  Past medical, surgical, family and social history reviewed. Medications reviewed all in the electronic medical record.   Review of Systems: No headache, visual changes, nausea, vomiting, diarrhea, constipation, dizziness, abdominal pain, skin rash, fevers, chills, night sweats, weight loss, swollen lymph nodes, body aches, joint swelling, muscle aches, chest pain, shortness of breath, mood changes.   Objective:    Blood pressure 148/82, pulse 79, height 5' 5"  (1.651 m), weight 257 lb (116.574 kg), SpO2 97.00%.   General: No apparent distress alert and oriented x3 mood and affect normal, dressed appropriately.  HEENT: Pupils equal, extraocular movements intact Respiratory: Patient's speak in full sentences and does not appear short of breath Cardiovascular: No lower extremity edema, non tender, no erythema Skin: Warm dry intact with no signs of infection or  rash on extremities or on axial skeleton. Abdomen: Soft nontender Neuro: Cranial nerves II through XII are intact, neurovascularly intact in all extremities with 2+ DTRs and 2+ pulses. Lymph: No lymphadenopathy of posterior or anterior cervical chain or axillae bilaterally.  Gait normal with good balance and coordination.  MSK: Non tender with full range of motion and good stability and symmetric strength and tone of shoulders, elbows, wrist, hip, knee and ankles bilaterally.  Back Exam:  Inspection: Unremarkable obese Motion: Flexion 45 deg, Extension 45 deg, Side Bending to 45 deg bilaterally,  Rotation to 35 deg bilaterally  SLR laying: Negative  XSLR laying: Negative  Palpable tenderness:  lumbar paraspinal musculature on the right side as well as the SI joint.Marland Kitchen FABER: negative. Sensory change: Gross sensation intact to all lumbar and sacral dermatomes.  Reflexes: 2+ at both patellar tendons, 2+ at achilles tendons, Babinski's downgoing.  Strength at foot  Plantar-flexion: 5/5 Dorsi-flexion: 5/5 Eversion: 5/5 Inversion: 5/5  Leg strength  Quad: 5/5 Hamstring: 5/5 Hip flexor: 5/5 Hip abductors: 5/5  Gait unremarkable.  OMT Physical Exam  Standing structural       Occiput  Shoulder  Inferior angle of scapula  Illiac crest  Medial malleolus  Standing flexion  right Seated Flexion right Cervical  neutral Thoracic T6 extended rotated and side bent right  Lumbar L3 flexed rotated inside that right Sacrum Right a right Illium Neutral     Impression and Recommendations:     This case required medical decision making of moderate complexity.

## 2013-02-20 NOTE — Patient Instructions (Signed)
Very nice to meet you Sacroiliac Joint Mobilization and Rehab 1. Work on pretzel stretching, shoulder back and leg draped in front. 3-5 sets, 30 sec.. 2. hip abductor rotations. standing, hip flexion and rotation outward then inward. 3 sets, 15 reps. when can do comfortably, add ankle weights starting at 2 pounds.  3. cross over stretching - shoulder back to ground, same side leg crossover. 3-5 sets for 30 min..  4. rolling up and back knees to chest and rocking. 5. sacral tilt - 5 sets, hold for 5-10 seconds  Look at handout Consider sleeping with a pillow between your knees Continue to wear good shoes.  Focus on core strength and weight loss. Calf raises 30 reps 1 set daily at least Ice baths for 20 minutes for your foot.     Come back again in 2 weeks if manipulation

## 2013-02-21 ENCOUNTER — Other Ambulatory Visit: Payer: Self-pay | Admitting: Gastroenterology

## 2013-02-21 ENCOUNTER — Other Ambulatory Visit: Payer: Self-pay | Admitting: Obstetrics and Gynecology

## 2013-03-10 ENCOUNTER — Encounter: Payer: Self-pay | Admitting: Family Medicine

## 2013-03-10 ENCOUNTER — Ambulatory Visit (INDEPENDENT_AMBULATORY_CARE_PROVIDER_SITE_OTHER): Payer: 59 | Admitting: Family Medicine

## 2013-03-10 VITALS — BP 142/88 | HR 89

## 2013-03-10 DIAGNOSIS — M999 Biomechanical lesion, unspecified: Secondary | ICD-10-CM

## 2013-03-10 DIAGNOSIS — M545 Low back pain, unspecified: Secondary | ICD-10-CM

## 2013-03-10 NOTE — Assessment & Plan Note (Signed)
Decision today to treat with OMT was based on Physical Exam  After verbal consent patient was treated with HVLA, ME techniques in cervical, thoracic, lumbar and sacral areas  Patient tolerated the procedure well with improvement in symptoms  Patient given exercises, stretches and lifestyle modifications  See medications in patient instructions if given  Patient will follow up in 4 weeks

## 2013-03-10 NOTE — Progress Notes (Signed)
  CC: low back pain followup  HPI: Patient is a very pleasant 62 year old obese female Coming in with back pain for multiple months duration. Patient is here for followup. Patient was seen previously and was diagnosed with more of a musculoskeletal imbalances. 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 Patient did have osteopathic manipulation given home exercise therapy. Patient states she is approximately 70% better. Patient states that she did have more pain for the first 48 hours and then has had significant improvement. Patient is no radiculopathy at this time. Patient is not doing exercises on a regular basis but tries to do when she can find time in the day. Patient denies any new symptoms. Overall she is happy with the results.  X-rays are ordered previously were reviewed by me. X-rays show that patient does have some mild facet degenerative changes at the couple different levels in the lumbar spine as well as a grade 1 anterior listhesis of L4.  Past medical, surgical, family and social history reviewed. Medications reviewed all in the electronic medical record.   Review of Systems: No headache, visual changes, nausea, vomiting, diarrhea, constipation, dizziness, abdominal pain, skin rash, fevers, chills, night sweats, weight loss, swollen lymph nodes, body aches, joint swelling, muscle aches, chest pain, shortness of breath, mood changes.   Objective:    Blood pressure 142/88, pulse 89, SpO2 97.00%.   General: No apparent distress alert and oriented x3 mood and affect normal, dressed appropriately.  HEENT: Pupils equal, extraocular movements intact Respiratory: Patient's speak in full sentences and does not appear short of breath Cardiovascular: No lower extremity edema, non tender, no erythema Skin: Warm dry intact with no signs of infection or rash on extremities or on axial skeleton. Abdomen: Soft nontender Neuro: Cranial nerves II through XII are intact, neurovascularly intact in  all extremities with 2+ DTRs and 2+ pulses. Lymph: No lymphadenopathy of posterior or anterior cervical chain or axillae bilaterally.  Gait normal with good balance and coordination.  MSK: Non tender with full range of motion and good stability and symmetric strength and tone of shoulders, elbows, wrist, hip, knee and ankles bilaterally.  Back Exam:  Inspection: Unremarkable obese Motion: Flexion 45 deg, Extension 45 deg, Side Bending to 45 deg bilaterally,  Rotation to 35 deg bilaterally  SLR laying: Negative  XSLR laying: Negative  Palpable tenderness:  lumbar paraspinal musculature on the right side as well as the SI joint.Marland Kitchen FABER: negative. Sensory change: Gross sensation intact to all lumbar and sacral dermatomes.  Reflexes: 2+ at both patellar tendons, 2+ at achilles tendons, Babinski's downgoing.  Strength at foot  Plantar-flexion: 5/5 Dorsi-flexion: 5/5 Eversion: 5/5 Inversion: 5/5  Leg strength  Quad: 5/5 Hamstring: 5/5 Hip flexor: 5/5 Hip abductors: 5/5  Gait unremarkable.  OMT Physical Exam  Standing flexion  right Seated Flexion right Cervical  C4 flexed rotated and side bent left C6 flexed rotated and side bent right  Thoracic T6 extended rotated and side bent right  Lumbar L 2 flexed rotated and side bent left Sacrum Right a right Illium Neutral     Impression and Recommendations:     This case required medical decision making of moderate complexity.

## 2013-03-10 NOTE — Assessment & Plan Note (Signed)
Still mechanical in nature. Patient is responding very well to osteopathic manipulation. We'll try to space throughout to 4 weeks. Patient was given other home exercises including postural exercises today which I think will be beneficial. Encourage to do exercises most days a week. Discuss weight loss again and now that could be very beneficial. Patient will followup in 4 weeks for further manipulation.

## 2013-03-10 NOTE — Patient Instructions (Signed)
I am sorry to hear about your family.  I am glad you are doing better though! Continue to do exercises most days of the week.  Also do the posture exercise on wall.  Have heels, butt, shoulders and head on wall for 5 minutes daily Lets try to stretch it out to 4 weeks.

## 2013-03-13 ENCOUNTER — Encounter (HOSPITAL_COMMUNITY): Payer: Self-pay | Admitting: Pharmacist

## 2013-03-17 ENCOUNTER — Other Ambulatory Visit: Payer: Self-pay | Admitting: *Deleted

## 2013-03-17 MED ORDER — GLUCOSE BLOOD VI STRP: ORAL_STRIP | Status: DC

## 2013-03-20 ENCOUNTER — Other Ambulatory Visit: Payer: Self-pay | Admitting: Gastroenterology

## 2013-03-20 ENCOUNTER — Other Ambulatory Visit: Payer: Self-pay

## 2013-03-20 ENCOUNTER — Encounter (HOSPITAL_COMMUNITY)
Admission: RE | Admit: 2013-03-20 | Discharge: 2013-03-20 | Disposition: A | Discharge status: Home / Self Care | Payer: 59 | Source: Ambulatory Visit | Attending: Obstetrics and Gynecology | Admitting: Obstetrics and Gynecology

## 2013-03-20 ENCOUNTER — Encounter (HOSPITAL_COMMUNITY): Payer: Self-pay

## 2013-03-20 DIAGNOSIS — Z01812 Encounter for preprocedural laboratory examination: Secondary | ICD-10-CM | POA: Insufficient documentation

## 2013-03-20 DIAGNOSIS — Z01818 Encounter for other preprocedural examination: Secondary | ICD-10-CM | POA: Insufficient documentation

## 2013-03-20 DIAGNOSIS — Z01810 Encounter for preprocedural cardiovascular examination: Secondary | ICD-10-CM | POA: Insufficient documentation

## 2013-03-20 HISTORY — DX: Gastro-esophageal reflux disease without esophagitis: K21.9

## 2013-03-20 LAB — CBC
HCT: 39.8 % (ref 36.0–46.0)
Hemoglobin: 13.6 g/dL (ref 12.0–15.0)
MCH: 31.3 pg (ref 26.0–34.0)
MCHC: 34.2 g/dL (ref 30.0–36.0)
MCV: 91.7 fL (ref 78.0–100.0)
Platelets: 250 10*3/uL (ref 150–400)
RBC: 4.34 MIL/uL (ref 3.87–5.11)
RDW: 12.9 % (ref 11.5–15.5)
WBC: 3.8 10*3/uL — ABNORMAL LOW (ref 4.0–10.5)

## 2013-03-20 LAB — BASIC METABOLIC PANEL
BUN: 17 mg/dL (ref 6–23)
CO2: 27 mEq/L (ref 19–32)
Calcium: 9.5 mg/dL (ref 8.4–10.5)
Chloride: 101 mEq/L (ref 96–112)
Creatinine, Ser: 0.86 mg/dL (ref 0.50–1.10)
GFR calc Af Amer: 82 mL/min — ABNORMAL LOW (ref >90)
GFR calc non Af Amer: 71 mL/min — ABNORMAL LOW (ref >90)
Glucose, Bld: 238 mg/dL — ABNORMAL HIGH (ref 70–99)
Potassium: 4.2 mEq/L (ref 3.5–5.1)
Sodium: 137 mEq/L (ref 135–145)

## 2013-03-20 NOTE — Pre-Procedure Instructions (Signed)
Dr. Royce Macadamia notified of pt's glucose result of 238- non fasting.

## 2013-03-20 NOTE — Patient Instructions (Signed)
Your procedure is scheduled on:03/24/13  Enter through the Main Entrance at :10am  Pick up desk phone and dial [phone] and inform us of your arrival.  Please call [phone] if you have any problems the morning of surgery.  Remember: Do not eat food or drink liquids, including water, after midnight:Sunday Clear liquids are ok until:0730 am   You may brush your teeth the morning of surgery.  Take these meds the morning of surgery with a sip of water:GERD med and BP pill  DO NOT wear jewelry, eye make-up, lipstick,body lotion, or dark fingernail polish.  (Polished toes are ok) You may wear deodorant.  If you are to be admitted after surgery, leave suitcase in car until your room has been assigned. Patients discharged on the day of surgery will not be allowed to drive home. Wear loose fitting, comfortable clothes for your ride home.

## 2013-03-24 ENCOUNTER — Ambulatory Visit (HOSPITAL_COMMUNITY)
Admission: RE | Admit: 2013-03-24 | Discharge: 2013-03-24 | Disposition: A | Discharge status: Home / Self Care | Payer: 59 | Source: Ambulatory Visit | Attending: Obstetrics and Gynecology | Admitting: Obstetrics and Gynecology

## 2013-03-24 ENCOUNTER — Ambulatory Visit (HOSPITAL_COMMUNITY): Payer: 59 | Admitting: Anesthesiology

## 2013-03-24 ENCOUNTER — Encounter (HOSPITAL_COMMUNITY): Payer: 59 | Admitting: Anesthesiology

## 2013-03-24 ENCOUNTER — Encounter (HOSPITAL_COMMUNITY)
Admission: RE | Disposition: A | Discharge status: Home / Self Care | Payer: Self-pay | Source: Ambulatory Visit | Attending: Obstetrics and Gynecology

## 2013-03-24 DIAGNOSIS — R9389 Abnormal findings on diagnostic imaging of other specified body structures: Secondary | ICD-10-CM | POA: Insufficient documentation

## 2013-03-24 DIAGNOSIS — N840 Polyp of corpus uteri: Secondary | ICD-10-CM

## 2013-03-24 HISTORY — PX: DILATATION & CURRETTAGE/HYSTEROSCOPY WITH RESECTOCOPE: SHX5572

## 2013-03-24 LAB — GLUCOSE, CAPILLARY: Glucose-Capillary: 118 mg/dL — ABNORMAL HIGH (ref 70–99)

## 2013-03-24 SURGERY — DILATATION & CURETTAGE/HYSTEROSCOPY WITH RESECTOCOPE
Anesthesia: General | Site: Cervix | Wound class: Clean Contaminated

## 2013-03-24 MED ORDER — LACTATED RINGERS IV SOLN
INTRAVENOUS | Status: DC
  Administered 2013-03-24 (×2): via INTRAVENOUS

## 2013-03-24 MED ORDER — FENTANYL CITRATE 0.05 MG/ML IJ SOLN
25.0000 ug | INTRAMUSCULAR | Status: DC | PRN
  Administered 2013-03-24 (×2): 50 ug via INTRAVENOUS

## 2013-03-24 MED ORDER — ROCURONIUM BROMIDE 100 MG/10ML IV SOLN
INTRAVENOUS | Status: AC
  Filled 2013-03-24: qty 1

## 2013-03-24 MED ORDER — LIDOCAINE HCL (CARDIAC) 20 MG/ML IV SOLN
INTRAVENOUS | Status: DC | PRN
  Administered 2013-03-24: 50 mg via INTRAVENOUS

## 2013-03-24 MED ORDER — LIDOCAINE HCL 1 % IJ SOLN
INTRAMUSCULAR | Status: AC
  Filled 2013-03-24: qty 20

## 2013-03-24 MED ORDER — MEPERIDINE HCL 25 MG/ML IJ SOLN: 6.2500 mg | INTRAMUSCULAR | Status: DC | PRN

## 2013-03-24 MED ORDER — GLYCINE 1.5 % IR SOLN
Status: DC | PRN
  Administered 2013-03-24: 1

## 2013-03-24 MED ORDER — PROMETHAZINE HCL 25 MG/ML IJ SOLN: 6.2500 mg | INTRAMUSCULAR | Status: DC | PRN

## 2013-03-24 MED ORDER — GLYCOPYRROLATE 0.2 MG/ML IJ SOLN
INTRAMUSCULAR | Status: AC
  Filled 2013-03-24: qty 4

## 2013-03-24 MED ORDER — NEOSTIGMINE METHYLSULFATE 1 MG/ML IJ SOLN
INTRAMUSCULAR | Status: DC | PRN
  Administered 2013-03-24: 2 mg via INTRAVENOUS
  Administered 2013-03-24: 1 mg via INTRAVENOUS
  Administered 2013-03-24: 2 mg via INTRAVENOUS

## 2013-03-24 MED ORDER — MIDAZOLAM HCL 2 MG/2ML IJ SOLN
INTRAMUSCULAR | Status: DC | PRN
  Administered 2013-03-24: 1 mg via INTRAVENOUS

## 2013-03-24 MED ORDER — PROPOFOL 10 MG/ML IV EMUL
INTRAVENOUS | Status: AC
  Filled 2013-03-24: qty 20

## 2013-03-24 MED ORDER — CEFAZOLIN SODIUM-DEXTROSE 2-3 GM-% IV SOLR
2.0000 g | INTRAVENOUS | Status: AC
  Administered 2013-03-24: 2 g via INTRAVENOUS

## 2013-03-24 MED ORDER — PHENYLEPHRINE 40 MCG/ML (10ML) SYRINGE FOR IV PUSH (FOR BLOOD PRESSURE SUPPORT)
PREFILLED_SYRINGE | INTRAVENOUS | Status: AC
  Filled 2013-03-24: qty 5

## 2013-03-24 MED ORDER — MIDAZOLAM HCL 2 MG/2ML IJ SOLN
INTRAMUSCULAR | Status: AC
  Filled 2013-03-24: qty 2

## 2013-03-24 MED ORDER — FENTANYL CITRATE 0.05 MG/ML IJ SOLN
INTRAMUSCULAR | Status: DC | PRN
  Administered 2013-03-24 (×2): 50 ug via INTRAVENOUS

## 2013-03-24 MED ORDER — SCOPOLAMINE 1 MG/3DAYS TD PT72
MEDICATED_PATCH | TRANSDERMAL | Status: DC
Start: 2013-03-24 — End: 2013-03-24
  Filled 2013-03-24: qty 1

## 2013-03-24 MED ORDER — FENTANYL CITRATE 0.05 MG/ML IJ SOLN
INTRAMUSCULAR | Status: AC
  Administered 2013-03-24: 50 ug via INTRAVENOUS
  Filled 2013-03-24: qty 2

## 2013-03-24 MED ORDER — CEFAZOLIN SODIUM-DEXTROSE 2-3 GM-% IV SOLR
INTRAVENOUS | Status: AC
  Filled 2013-03-24: qty 50

## 2013-03-24 MED ORDER — METOCLOPRAMIDE HCL 5 MG/ML IJ SOLN
10.0000 mg | Freq: Once | INTRAMUSCULAR | Status: AC
  Administered 2013-03-24: 10 mg via INTRAVENOUS

## 2013-03-24 MED ORDER — ONDANSETRON HCL 4 MG/2ML IJ SOLN
INTRAMUSCULAR | Status: DC | PRN
  Administered 2013-03-24: 4 mg via INTRAVENOUS

## 2013-03-24 MED ORDER — NEOSTIGMINE METHYLSULFATE 1 MG/ML IJ SOLN
INTRAMUSCULAR | Status: AC
  Filled 2013-03-24: qty 1

## 2013-03-24 MED ORDER — GLYCOPYRROLATE 0.2 MG/ML IJ SOLN
INTRAMUSCULAR | Status: DC | PRN
  Administered 2013-03-24: .4 mg via INTRAVENOUS
  Administered 2013-03-24: 0.2 mg via INTRAVENOUS
  Administered 2013-03-24: 0.4 mg via INTRAVENOUS

## 2013-03-24 MED ORDER — LIDOCAINE HCL (CARDIAC) 20 MG/ML IV SOLN
INTRAVENOUS | Status: AC
  Filled 2013-03-24: qty 5

## 2013-03-24 MED ORDER — LIDOCAINE HCL 1 % IJ SOLN
INTRAMUSCULAR | Status: DC | PRN
  Administered 2013-03-24: 10 mL

## 2013-03-24 MED ORDER — PROPOFOL 10 MG/ML IV BOLUS
INTRAVENOUS | Status: DC | PRN
  Administered 2013-03-24: 200 mg via INTRAVENOUS

## 2013-03-24 MED ORDER — ONDANSETRON HCL 4 MG/2ML IJ SOLN
INTRAMUSCULAR | Status: AC
  Filled 2013-03-24: qty 2

## 2013-03-24 MED ORDER — SCOPOLAMINE 1 MG/3DAYS TD PT72
1.0000 | MEDICATED_PATCH | TRANSDERMAL | Status: DC
  Administered 2013-03-24: 1.5 mg via TRANSDERMAL

## 2013-03-24 MED ORDER — METOCLOPRAMIDE HCL 5 MG/ML IJ SOLN
INTRAMUSCULAR | Status: AC
  Administered 2013-03-24: 10 mg via INTRAVENOUS
  Filled 2013-03-24: qty 2

## 2013-03-24 MED ORDER — FENTANYL CITRATE 0.05 MG/ML IJ SOLN
INTRAMUSCULAR | Status: AC
  Filled 2013-03-24: qty 5

## 2013-03-24 MED ORDER — MIDAZOLAM HCL 2 MG/2ML IJ SOLN: 0.5000 mg | Freq: Once | INTRAMUSCULAR | Status: DC | PRN

## 2013-03-24 MED ORDER — PHENYLEPHRINE HCL 10 MG/ML IJ SOLN
INTRAMUSCULAR | Status: DC | PRN
  Administered 2013-03-24: 40 ug via INTRAVENOUS
  Administered 2013-03-24 (×3): 80 ug via INTRAVENOUS

## 2013-03-24 SURGICAL SUPPLY — 15 items
CANISTER SUCT 3000ML (MISCELLANEOUS) ×2 IMPLANT
CATH ROBINSON RED A/P 16FR (CATHETERS) ×2 IMPLANT
CLOTH BEACON ORANGE TIMEOUT ST (SAFETY) ×2 IMPLANT
CONTAINER PREFILL 10% NBF 60ML (FORM) ×4 IMPLANT
DRESSING TELFA 8X3 (GAUZE/BANDAGES/DRESSINGS) ×2 IMPLANT
ELECT REM PT RETURN 9FT ADLT (ELECTROSURGICAL) ×2
ELECTRODE REM PT RTRN 9FT ADLT (ELECTROSURGICAL) ×1 IMPLANT
GLOVE ECLIPSE 7.0 STRL STRAW (GLOVE) ×4 IMPLANT
GOWN PREVENTION PLUS XLARGE (GOWN DISPOSABLE) ×2 IMPLANT
GOWN STRL REIN XL XLG (GOWN DISPOSABLE) ×4 IMPLANT
LOOP ANGLED CUTTING 22FR (CUTTING LOOP) ×2 IMPLANT
PACK HYSTEROSCOPY LF (CUSTOM PROCEDURE TRAY) ×2 IMPLANT
PAD OB MATERNITY 4.3X12.25 (PERSONAL CARE ITEMS) ×2 IMPLANT
TOWEL OR 17X24 6PK STRL BLUE (TOWEL DISPOSABLE) ×4 IMPLANT
WATER STERILE IRR 1000ML POUR (IV SOLUTION) ×2 IMPLANT

## 2013-03-24 NOTE — Anesthesia Postprocedure Evaluation (Addendum)
  Anesthesia Post-op Note  Anesthesia Post Note  Patient: Jessica [name]  Procedure(s) Performed: Procedure(s) (LRB): DILATATION & CURETTAGE, HYSTEROSCOPY WITH RESECTION (N/A)  Anesthesia type: General  Patient location: PACU  Post pain: Pain level controlled  Post assessment: Post-op Vital signs reviewed  Last Vitals:  Filed Vitals:   03/24/13 1430  BP:   Pulse: 62  Temp: 36.6 C  Resp: 16    Post vital signs: Reviewed  Level of consciousness: sedated  Complications: No apparent anesthesia complications.  Asked by PACU nurse to assess for cough post-op.  Pt c/o mild sore throat and cough with deep breaths.  Reports she feels better than when she came out of OR.  Is sitting up in phase 2 ready for D/C.  Lungs are CTAB (after she coughs to clear airway secretions).  Coughs initially with deep breathing, but clears after a few breaths.  Encouraged to cough/deep breathe at home.  Reassured that mild sore throat is normal after GETA and explained rationale for GETA given her h/o diabetic gastroparesis and GERD.  She understands and is appreciative of our care.

## 2013-03-24 NOTE — H&P (Signed)
Jessica [name] is an 62 y.o. No obstetric history on file. Unknown female.   Chief Complaint: Pt presents for a D&C hysteroscopy for an abnormally thickened endometrial stripe. She has a [facility] of endometrial cancer. HPI:  Past Medical History  Diagnosis Date  . Asthma     childhood  . Hemorrhoids   . Autonomic neuropathy     diabetic  . Hypertension   . Gastroparesis   . Unspecified gastritis and gastroduodenitis without mention of hemorrhage   . Personal history of colonic polyps 03/2010    hyperplastic  . Chest pain     intermittant left sided chest pain, non-exertional, lasting 1-2 min  . Nasal congestion current    sore throat, ears-on antibiotic  . PONV (postoperative nausea and vomiting)   . Diabetes mellitus     type II, Hemoglobin A1C 9.9 10/05/2011  . GERD (gastroesophageal reflux disease)     Past Surgical History  Procedure Laterality Date  . Tubal ligation    . Appendectomy    . Left breast cyst removal  2000    Family History  Problem Relation Age of Onset  . Colon cancer Sister   . Diabetes Sister   . Diabetes Brother   . Heart disease Father   . Other Father     2 collapsed lungs   Social History:  reports that she has never smoked. She has never used smokeless tobacco. She reports that she does not drink alcohol or use illicit drugs.  Allergies:  Allergies  Allergen Reactions  . Nsaids Nausea And Vomiting    Cannot tolerate large doses     Medications Prior to Admission  Medication Sig Dispense Refill  . atorvastatin (LIPITOR) 40 MG tablet Take 1 tablet (40 mg total) by mouth daily.  30 tablet  5  . DEXILANT 60 MG capsule TAKE ONE CAPSULE BY MOUTH EVERY DAY  30 capsule  6  . gabapentin (NEURONTIN) 300 MG capsule TAKE ONE CAPSULE BY MOUTH TWICE A DAY  60 capsule  2  . valsartan (DIOVAN) 320 MG tablet Take 1 tablet (320 mg total) by mouth daily.  30 tablet  2  . clidinium-chlordiazePOXIDE (LIBRAX) 5-2.5 MG per capsule Take 1 capsule by mouth 3  (three) times daily as needed.  100 capsule  6  . fluticasone (FLONASE) 50 MCG/ACT nasal spray Place 2 sprays into the nose daily.  16 g  6  . glucose blood (ONE TOUCH ULTRA TEST) test strip Use as directed twice daily dx 250.00  100 each  3  . HYDROcodone-acetaminophen (NORCO) 10-325 MG per tablet Take 1 tablet by mouth every 6 (six) hours as needed for pain.  30 tablet  0  . hyoscyamine (LEVSIN SL) 0.125 MG SL tablet PLACE 1 TABLET UNDER THE TONGUE EVERY 4 HOURS AS NEEDED FOR CRAMPING.  30 tablet  1  . Insulin Lispro Prot & Lispro (75-25) 100 UNIT/ML SUPN Inject 80 Units into the skin 2 (two) times daily with a meal. 90 units with breakfast, and 80 units with the evening meal      . Insulin Pen Needle (PC UNIFINE PENTIPS) 31G X 8 MM MISC Use as directed 4 times daily.  130 each  3  . polyethylene glycol powder (GLYCOLAX/MIRALAX) powder USE 1 SCOOP AS DIRECTED 1-2 TIMES DAILY  527 g  1  . sucralfate (CARAFATE) 1 GM/10ML suspension Take 10 mLs (1 g total) by mouth every 4 (four) hours.  420 mL  3  . valACYclovir (  VALTREX) 500 MG tablet Take 1 tablet (500 mg total) by mouth daily.  60 tablet  3     Pertinent items are noted in HPI.  Blood pressure 146/74, pulse 84, temperature 97.9 F (36.6 C), temperature source Oral, resp. rate 18, SpO2 100.00%. General appearance: alert and cooperative Abdomen: soft, non-tender; bowel sounds normal; no masses,  no organomegaly Pelvic: cervix normal in appearance, external genitalia normal, no adnexal masses or tenderness, no cervical motion tenderness, rectovaginal septum normal, uterus normal size, shape, and consistency and vagina normal without discharge   Lab Results  Component Value Date   WBC 3.8* 03/20/2013   HGB 13.6 03/20/2013   HCT 39.8 03/20/2013   MCV 91.7 03/20/2013   PLT 250 03/20/2013   No results found for this basename: PREGTESTUR, PREGSERUM, HCG, HCGQUANT     Assessment/Plan Proceed with Ablation  Patient Active Problem List    Diagnosis Date Noted  . Nonallopathic lesion of lumbar region 02/20/2013  . Nonallopathic lesion of sacral region 02/20/2013  . Nonallopathic lesion of thoracic region 02/20/2013  . Low back pain 12/20/2012  . Encounter for long-term (current) use of other medications 04/04/2012  . Dysuria 01/04/2012  . Chest pain 10/05/2011  . Rash 02/28/2011  . OSA (obstructive sleep apnea) 02/28/2011  . Constipation, slow transit 09/23/2010  . Dyspepsia and other specified disorders of function of stomach 09/23/2010  . DM (diabetes mellitus) 09/23/2010  . FH: colon cancer 09/23/2010  . Personal history of colonic polyps 09/23/2010  . Plantar fasciitis 08/17/2010  . Acute upper respiratory infections of unspecified site 07/22/2010  . SINUSITIS, ACUTE 05/28/2010  . EDEMA 12/20/2009  . SHOULDER DISLOCATION 12/20/2009  . NUMBNESS 10/07/2009  . OTITIS EXTERNA, ACUTE 04/01/2009  . HYPERCHOLESTEROLEMIA 01/29/2009  . CHEST PAIN UNSPECIFIED 01/14/2009  . ABDOMINAL PAIN 01/14/2009  . CONSTIPATION 11/05/2008  . LEUKOPENIA, CHRONIC 04/01/2008  . ASYMPTOMATIC POSTMENOPAUSAL STATUS 04/01/2008  . SKIN DISORDER 12/26/2007  . CRAMP IN LIMB 09/26/2007  . NECK MASS 09/26/2007  . ALLERGIC RHINITIS CAUSE UNSPECIFIED 06/21/2007  . LEG PAIN, BILATERAL 06/21/2007  . GASTRITIS 02/27/2007  . GASTROPARESIS 02/27/2007  . FOREIGN BODY IN DIGESTIVE SYSTEM UNSPECIFIED 02/27/2007  . DIABETES MELLITUS, TYPE II 10/22/2006  . AUTONOMIC NEUROPATHY, DIABETIC 10/22/2006  . ANEMIA-IRON DEFICIENCY 10/22/2006  . HYPERTENSION 10/22/2006  . HEMORRHOIDS 10/22/2006  . ASTHMA, CHILDHOOD 10/22/2006   Abnormal endometrium  ANDERSON,MARK E 03/24/2013, 11:52 AM

## 2013-03-24 NOTE — Transfer of Care (Signed)
Immediate Anesthesia Transfer of Care Note  Patient: Jessica [name]  Procedure(s) Performed: Procedure(s): DILATATION & CURETTAGE/HYSTEROSCOPY WITH RESECTOCOPE AVAILABLE (N/A)  Patient Location: PACU  Anesthesia Type:General  Level of Consciousness: awake  Airway & Oxygen Therapy: Patient Spontanous Breathing  Post-op Assessment: Report given to PACU RN  Post vital signs: stable  Filed Vitals:   03/24/13 1000  BP: 146/74  Pulse: 84  Temp: 36.6 C  Resp: 18    Complications: No apparent anesthesia complications

## 2013-03-24 NOTE — Anesthesia Preprocedure Evaluation (Addendum)
Anesthesia Evaluation  Patient identified by MRN, date of birth, ID band Patient awake    Reviewed: Allergy & Precautions, H&P , Patient's Chart, lab work & pertinent test results, reviewed documented beta blocker date and time   History of Anesthesia Complications Negative for: history of anesthetic complications  Airway Mallampati: III TM Distance: >3 FB Neck ROM: full    Dental   Pulmonary asthma , sleep apnea ,  breath sounds clear to auscultation        Cardiovascular Exercise Tolerance: Good hypertension, Rhythm:regular Rate:Normal     Neuro/Psych  Neuromuscular disease (diabetic autonomic neuropathy) negative psych ROS   GI/Hepatic GERD-  Controlled,gastroparesis   Endo/Other  diabetesMorbid obesity  Renal/GU      Musculoskeletal   Abdominal   Peds  Hematology  (+) anemia ,   Anesthesia Other Findings   Reproductive/Obstetrics                          Anesthesia Physical Anesthesia Plan  ASA: III  Anesthesia Plan: General ETT   Post-op Pain Management:    Induction:   Airway Management Planned:   Additional Equipment:   Intra-op Plan:   Post-operative Plan:   Informed Consent: I have reviewed the patients History and Physical, chart, labs and discussed the procedure including the risks, benefits and alternatives for the proposed anesthesia with the patient or authorized representative who has indicated his/her understanding and acceptance.   Dental Advisory Given  Plan Discussed with: CRNA, Surgeon and Anesthesiologist  Anesthesia Plan Comments:        Anesthesia Quick Evaluation

## 2013-03-25 ENCOUNTER — Encounter (HOSPITAL_COMMUNITY): Payer: Self-pay | Admitting: Obstetrics and Gynecology

## 2013-03-25 LAB — GLUCOSE, CAPILLARY: Glucose-Capillary: 120 mg/dL — ABNORMAL HIGH (ref 70–99)

## 2013-03-25 NOTE — Op Note (Signed)
[name], Jessica [name]               ACCOUNT NO.:  [id number]  MEDICAL RECORD NO.:  [id number]  LOCATION:  [facility]                          FACILITY:  [facility]  PHYSICIAN:  Freda Munro, M.D.    DATE OF BIRTH:  June 26, 1950  DATE OF PROCEDURE:  03/24/2013 DATE OF DISCHARGE:  03/24/2013                              OPERATIVE REPORT   PREOPERATIVE DIAGNOSIS:  Abnormal endometrium on ultrasound.  POSTOPERATIVE DIAGNOSIS:  Abnormal endometrium on ultrasound with endometrial polyp.  PROCEDURES: 1. Diagnostic hysteroscopy. 2. Resection of endometrial polyp. 3. Dilation and curettage.  SURGEON:  Freda Munro, M.D.  ANESTHESIA:  General and local.  ESTIMATED BLOOD LOSS:  Minimal.  SPECIMENS:  Endometrial polyp sent to Pathology.  COMPLICATIONS:  None.  DRAINS:  None.  DESCRIPTION OF PROCEDURE:  The patient was taken to the operating room. She was placed in dorsal supine position and general anesthetic was administered without difficulty.  She was then placed in dorsal lithotomy position.  She was prepped and draped in usual fashion for this procedure.  An exam under anesthesia revealed a normal anteverted uterus, normal size and shape.  There were no adnexal masses.  The patient does have a cystocele and rectocele.  She has no uterine prolapse.  At this point, a speculum was placed in the vagina and 10 mL of 1% lidocaine was used for paracervical block.  A single-tooth tenaculum was applied to the anterior cervical lip.  The cervix was serially dilated to a 29-French.  The hysteroscope was advanced through the endocervical canal, which appeared to be normal.  On entering the uterine cavity, both ostia was visualized.  The lining of the uterus appeared to be thin except for polyp arising from the right lateral sidewall of the midbody of the uterus.  At this point, the hysteroscope was removed and attempt to remove the polyp was performed.  The hysteroscope was placed back to the uterine  cavity and it was noted that the polyp was not removed.  At this point, a resectoscope was set up and placed through the endocervical canal into the uterus.  The polyp and tissue around the polyp were resected and sent to Pathology.  The patient tolerated the procedure well.  She was taken to the recovery room in stable condition.  Instrument and lap count were correct x2.  She will be discharged to home.  She will follow up in the office in 4 weeks.  She will be sent home with no medication.          ______________________________ Freda Munro, M.D.     MA/MEDQ  D:  03/24/2013  T:  03/25/2013  Job:  [id number]

## 2013-03-31 ENCOUNTER — Other Ambulatory Visit: Payer: Self-pay | Admitting: Endocrinology

## 2013-04-07 ENCOUNTER — Ambulatory Visit: Payer: 59 | Admitting: Family Medicine

## 2013-04-19 ENCOUNTER — Other Ambulatory Visit: Payer: Self-pay | Admitting: Gastroenterology

## 2013-05-08 ENCOUNTER — Other Ambulatory Visit: Payer: Self-pay | Admitting: *Deleted

## 2013-05-08 MED ORDER — GABAPENTIN 300 MG PO CAPS: ORAL_CAPSULE | ORAL | Status: DC

## 2013-05-16 ENCOUNTER — Encounter: Payer: 59 | Admitting: Endocrinology

## 2013-05-21 ENCOUNTER — Encounter: Payer: Self-pay | Admitting: Endocrinology

## 2013-05-21 ENCOUNTER — Ambulatory Visit (INDEPENDENT_AMBULATORY_CARE_PROVIDER_SITE_OTHER): Payer: 59 | Admitting: Endocrinology

## 2013-05-21 VITALS — BP 122/80 | HR 75 | Temp 97.7°F | Ht 65.0 in | Wt 256.0 lb

## 2013-05-21 DIAGNOSIS — I10 Essential (primary) hypertension: Secondary | ICD-10-CM

## 2013-05-21 DIAGNOSIS — E7800 Pure hypercholesterolemia, unspecified: Secondary | ICD-10-CM

## 2013-05-21 DIAGNOSIS — N951 Menopausal and female climacteric states: Secondary | ICD-10-CM | POA: Insufficient documentation

## 2013-05-21 DIAGNOSIS — E119 Type 2 diabetes mellitus without complications: Secondary | ICD-10-CM

## 2013-05-21 DIAGNOSIS — D509 Iron deficiency anemia, unspecified: Secondary | ICD-10-CM

## 2013-05-21 DIAGNOSIS — Z79899 Other long term (current) drug therapy: Secondary | ICD-10-CM

## 2013-05-21 LAB — BASIC METABOLIC PANEL WITH GFR
BUN: 24 mg/dL — ABNORMAL HIGH (ref 6–23)
CO2: 26 meq/L (ref 19–32)
Calcium: 8.9 mg/dL (ref 8.4–10.5)
Chloride: 105 meq/L (ref 96–112)
Creatinine, Ser: 0.9 mg/dL (ref 0.4–1.2)
GFR: 83.56 mL/min
Glucose, Bld: 137 mg/dL — ABNORMAL HIGH (ref 70–99)
Potassium: 3.9 meq/L (ref 3.5–5.1)
Sodium: 137 meq/L (ref 135–145)

## 2013-05-21 LAB — CBC WITH DIFFERENTIAL/PLATELET
Basophils Absolute: 0 K/uL (ref 0.0–0.1)
Basophils Relative: 0.4 % (ref 0.0–3.0)
Eosinophils Absolute: 0.1 K/uL (ref 0.0–0.7)
Eosinophils Relative: 2.7 % (ref 0.0–5.0)
HCT: 39.3 % (ref 36.0–46.0)
Hemoglobin: 13.3 g/dL (ref 12.0–15.0)
Lymphocytes Relative: 39.3 % (ref 12.0–46.0)
Lymphs Abs: 1.5 K/uL (ref 0.7–4.0)
MCHC: 33.8 g/dL (ref 30.0–36.0)
MCV: 94.7 fl (ref 78.0–100.0)
Monocytes Absolute: 0.2 K/uL (ref 0.1–1.0)
Monocytes Relative: 5.5 % (ref 3.0–12.0)
Neutro Abs: 2 K/uL (ref 1.4–7.7)
Neutrophils Relative %: 52.1 % (ref 43.0–77.0)
Platelets: 239 K/uL (ref 150.0–400.0)
RBC: 4.16 Mil/uL (ref 3.87–5.11)
RDW: 13.5 % (ref 11.5–14.6)
WBC: 3.9 K/uL — ABNORMAL LOW (ref 4.5–10.5)

## 2013-05-21 LAB — LIPID PANEL
Cholesterol: 138 mg/dL (ref 0–200)
HDL: 46.3 mg/dL (ref >39.00)
LDL Cholesterol: 75 mg/dL (ref 0–99)
Total CHOL/HDL Ratio: 3
Triglycerides: 84 mg/dL (ref 0.0–149.0)
VLDL: 16.8 mg/dL (ref 0.0–40.0)

## 2013-05-21 LAB — URINALYSIS, ROUTINE W REFLEX MICROSCOPIC
Bilirubin Urine: NEGATIVE
Hgb urine dipstick: NEGATIVE
Ketones, ur: NEGATIVE
Leukocytes, UA: NEGATIVE
Nitrite: NEGATIVE
Specific Gravity, Urine: >=1.03 — AB (ref 1.000–1.030)
Total Protein, Urine: NEGATIVE
Urine Glucose: 250 — AB
Urobilinogen, UA: 0.2 (ref 0.0–1.0)
pH: 5.5 (ref 5.0–8.0)

## 2013-05-21 LAB — IBC PANEL
Iron: 82 ug/dL (ref 42–145)
Saturation Ratios: 26.2 % (ref 20.0–50.0)
Transferrin: 223.9 mg/dL (ref 212.0–360.0)

## 2013-05-21 LAB — HEPATIC FUNCTION PANEL
ALT: 20 U/L (ref 0–35)
AST: 17 U/L (ref 0–37)
Albumin: 3.8 g/dL (ref 3.5–5.2)
Alkaline Phosphatase: 89 U/L (ref 39–117)
Bilirubin, Direct: 0 mg/dL (ref 0.0–0.3)
Total Bilirubin: 0.3 mg/dL (ref 0.3–1.2)
Total Protein: 6.9 g/dL (ref 6.0–8.3)

## 2013-05-21 LAB — HEMOGLOBIN A1C: Hgb A1c MFr Bld: 8 % — ABNORMAL HIGH (ref 4.6–6.5)

## 2013-05-21 LAB — MICROALBUMIN / CREATININE URINE RATIO
Creatinine,U: 127 mg/dL
Microalb Creat Ratio: 0.4 mg/g (ref 0.0–30.0)
Microalb, Ur: 0.5 mg/dL (ref 0.0–1.9)

## 2013-05-21 LAB — TSH: TSH: 0.74 u[IU]/mL (ref 0.35–5.50)

## 2013-05-21 NOTE — Patient Instructions (Addendum)
please consider these measures for your health:  minimize alcohol.  do not use tobacco products.  have a colonoscopy at least every 10 years from age 63.  Women should have an annual mammogram from age 56.  keep firearms safely stored.  always use seat belts.  have working smoke alarms in your home.  see an eye doctor and dentist regularly.  never drive under the influence of alcohol or drugs (including prescription drugs).   blood tests are being requested for you today.  We'll contact you with results. Please come back for a follow-up appointment in 3 months.

## 2013-05-21 NOTE — Progress Notes (Signed)
Subjective:    Patient ID: Jessica [name], female    DOB: Mar 17, 1951, 63 y.o.   MRN: [id number]  HPI Pt is here for regular wellness examination, and is feeling pretty well in general, and says chronic med probs are stable, except as noted below Past Medical History  Diagnosis Date  . Asthma     childhood  . Hemorrhoids   . Autonomic neuropathy     diabetic  . Hypertension   . Gastroparesis   . Unspecified gastritis and gastroduodenitis without mention of hemorrhage   . Personal history of colonic polyps 03/2010    hyperplastic  . Chest pain     intermittant left sided chest pain, non-exertional, lasting 1-2 min  . Nasal congestion current    sore throat, ears-on antibiotic  . PONV (postoperative nausea and vomiting)   . Diabetes mellitus     type II, Hemoglobin A1C 9.9 10/05/2011  . GERD (gastroesophageal reflux disease)     Past Surgical History  Procedure Laterality Date  . Tubal ligation    . Appendectomy    . Left breast cyst removal  2000  . Dilatation & currettage/hysteroscopy with resectocope N/A 03/24/2013    Procedure: DILATATION & CURETTAGE, HYSTEROSCOPY WITH RESECTION;  Surgeon: Olga Millers, MD;  Location: [facility];  Service: Gynecology;  Laterality: N/A;    History   Social History  . Marital Status: Married    Spouse Name: Devar Beavin    Number of Children: 2  . Years of Education: N/A   Occupational History  . works for school system    Social History Main Topics  . Smoking status: Never Smoker   . Smokeless tobacco: Never Used  . Alcohol Use: No  . Drug Use: No  . Sexual Activity: Not on file   Other Topics Concern  . Not on file   Social History Narrative  . No narrative on file    Current Outpatient Prescriptions on File Prior to Visit  Medication Sig Dispense Refill  . atorvastatin (LIPITOR) 40 MG tablet Take 1 tablet (40 mg total) by mouth daily.  30 tablet  5  . B-D ULTRAFINE III SHORT PEN 31G X 8 MM MISC USE AS DIRECTED 4  TIMES DAILY.  130 each  2  . clidinium-chlordiazePOXIDE (LIBRAX) 5-2.5 MG per capsule Take 1 capsule by mouth 3 (three) times daily as needed.  100 capsule  6  . DEXILANT 60 MG capsule TAKE ONE CAPSULE BY MOUTH EVERY DAY  30 capsule  6  . fluticasone (FLONASE) 50 MCG/ACT nasal spray Place 2 sprays into the nose daily.  16 g  6  . gabapentin (NEURONTIN) 300 MG capsule TAKE ONE CAPSULE BY MOUTH TWICE A DAY  60 capsule  2  . glucose blood (ONE TOUCH ULTRA TEST) test strip Use as directed twice daily dx 250.00  100 each  3  . HYDROcodone-acetaminophen (NORCO) 10-325 MG per tablet Take 1 tablet by mouth every 6 (six) hours as needed for pain.  30 tablet  0  . hyoscyamine (LEVSIN SL) 0.125 MG SL tablet PLACE 1 TABLET UNDER THE TONGUE EVERY 4 HOURS AS NEEDED FOR CRAMPING.  30 tablet  1  . Insulin Lispro Prot & Lispro (75-25) 100 UNIT/ML SUPN 100 units with breakfast, and 90 units with the evening meal      . polyethylene glycol powder (GLYCOLAX/MIRALAX) powder USE 1 SCOOP AS DIRECTED 1-2 TIMES DAILY  527 g  1  . sucralfate (CARAFATE) 1 GM/10ML  suspension Take 10 mLs (1 g total) by mouth every 4 (four) hours.  420 mL  3  . valACYclovir (VALTREX) 500 MG tablet Take 1 tablet (500 mg total) by mouth daily.  60 tablet  3  . valsartan (DIOVAN) 320 MG tablet Take 1 tablet (320 mg total) by mouth daily.  30 tablet  2   No current facility-administered medications on file prior to visit.    Allergies  Allergen Reactions  . Nsaids Nausea And Vomiting    Cannot tolerate large doses     Family History  Problem Relation Age of Onset  . Colon cancer Sister   . Diabetes Sister   . Diabetes Brother   . Heart disease Father   . Other Father     2 collapsed lungs    BP 122/80  Pulse 75  Temp(Src) 97.7 F (36.5 C) (Oral)  Ht 5' 5"  (1.651 m)  Wt 256 lb (116.121 kg)  BMI 42.60 kg/m2  SpO2 97%  Review of Systems  Constitutional: Negative for fever.  HENT: Negative for hearing loss.   Eyes: Negative  for visual disturbance.  Respiratory: Negative for shortness of breath.   Cardiovascular: Negative for chest pain.  Endocrine: Negative for cold intolerance.  Musculoskeletal: Positive for back pain.  Skin: Negative for rash.  Allergic/Immunologic: Positive for environmental allergies.  Neurological: Negative for syncope and numbness.  Hematological: Does not bruise/bleed easily.  Psychiatric/Behavioral: Positive for dysphoric mood.       Objective:   Physical Exam VS: see vs page GEN: no distress HEAD: head: no deformity eyes: no periorbital swelling, no proptosis external nose and ears are normal mouth: no lesion seen NECK: supple, thyroid is not enlarged CHEST WALL: no deformity LUNGS:  Clear to auscultation BREASTS:  sees gyn CV: reg rate and rhythm, no murmur ABD: abdomen is soft, nontender.  no hepatosplenomegaly.  not distended.  no hernia GENITALIA/RECTAL: sees gyn MUSCULOSKELETAL: muscle bulk and strength are grossly normal.  no obvious joint swelling.  gait is normal and steady PULSES: no carotid bruit NEURO:  cn 2-12 grossly intact.   readily moves all 4's.   SKIN:  Normal texture and temperature.  No rash or suspicious lesion is visible.   NODES:  None palpable at the neck PSYCH: alert, well-oriented.  Does not appear anxious nor depressed.        Assessment & Plan:  Wellness visit today, with problems stable, except as noted. we discussed code status.  pt requests full code, but would not want to be started or maintained on artificial life-support measures if there was not a reasonable chance of recovery.      SEPARATE EVALUATION FOLLOWS--EACH PROBLEM HERE IS NEW, NOT RESPONDING TO TREATMENT, OR POSES SIGNIFICANT RISK TO THE PATIENT'S HEALTH: HISTORY OF THE PRESENT ILLNESS: Pt returns for f/u of IDDM (dx'ed 2000, when she presented with monilial vaginitis; she has mild if any neuropathy of the lower extremities; she has associated autonomic neuropathy; she  needs the simpler bid insulin regimen; she has never had severe hypoglycemia or DKA).  no cbg record, but states cbg's vary from 70-170.  There is no trend throughout the day. PAST MEDICAL HISTORY reviewed and up to date today REVIEW OF SYSTEMS: denies hypoglycemia and weight change PHYSICAL EXAMINATION: VITAL SIGNS:  See vs page GENERAL: no distress LAB/XRAY RESULTS: Lab Results  Component Value Date   HGBA1C 8.0* 05/21/2013  IMPRESSION: DM: she needs increased rx Neuropathy: this limits exercise rx of DM PLAN: See  instruction page

## 2013-05-23 ENCOUNTER — Other Ambulatory Visit: Payer: Self-pay | Admitting: Endocrinology

## 2013-05-27 ENCOUNTER — Telehealth: Payer: Self-pay

## 2013-05-27 ENCOUNTER — Telehealth: Payer: Self-pay | Admitting: Endocrinology

## 2013-05-27 NOTE — Telephone Encounter (Signed)
Pt was told by Dr. Loanne Drilling to set up an appt with Dr. Alain Marion to have skin tags removed.  She states there are 10-15 that need to be removed.  Will this be ok to schedule?

## 2013-05-27 NOTE — Telephone Encounter (Signed)
Pt states that she needs to be set up with Dr. Alain Marion to have some skin tags removed and also wants to have her bone density done on the same day. Does a referral need to be placed for this to be done. Also, does our office set the visit up or does the patient call?  Please advise,  Thanks!

## 2013-05-27 NOTE — Telephone Encounter (Signed)
No, patient may call [phone]

## 2013-05-27 NOTE — Telephone Encounter (Signed)
Ok Thx 

## 2013-05-27 NOTE — Telephone Encounter (Signed)
Pt informed

## 2013-05-28 NOTE — Telephone Encounter (Signed)
Appt on Feb. 6.

## 2013-05-30 ENCOUNTER — Telehealth: Payer: Self-pay

## 2013-05-30 NOTE — Telephone Encounter (Signed)
Called patient back and left suggestions on phone.

## 2013-05-30 NOTE — Telephone Encounter (Signed)
The patient called and is hoping to get a call back about some exercises she can do to relieve her back pain.    Pt's callback - [phone]

## 2013-06-02 ENCOUNTER — Encounter: Payer: Self-pay | Admitting: Family Medicine

## 2013-06-02 ENCOUNTER — Ambulatory Visit (INDEPENDENT_AMBULATORY_CARE_PROVIDER_SITE_OTHER): Payer: 59 | Admitting: Family Medicine

## 2013-06-02 VITALS — BP 134/76 | HR 91 | Temp 97.6°F | Resp 18 | Wt 256.0 lb

## 2013-06-02 DIAGNOSIS — M545 Low back pain, unspecified: Secondary | ICD-10-CM

## 2013-06-02 MED ORDER — METHYLPREDNISOLONE ACETATE 80 MG/ML IJ SUSP
80.0000 mg | Freq: Once | INTRAMUSCULAR | Status: AC
  Administered 2013-06-02: 80 mg via INTRAMUSCULAR

## 2013-06-02 MED ORDER — KETOROLAC TROMETHAMINE 60 MG/2ML IM SOLN
60.0000 mg | Freq: Once | INTRAMUSCULAR | Status: AC
  Administered 2013-06-02: 60 mg via INTRAMUSCULAR

## 2013-06-02 MED ORDER — TRAMADOL HCL 50 MG PO TABS: 50.0000 mg | ORAL_TABLET | Freq: Every evening | ORAL | Status: DC | PRN

## 2013-06-02 MED ORDER — MELOXICAM 15 MG PO TABS: 15.0000 mg | ORAL_TABLET | Freq: Every day | ORAL | Status: DC

## 2013-06-02 NOTE — Patient Instructions (Signed)
I am sorry you are hurting.  Try meloxicam daily for 10 days then as needed.  Tramadol as needed for pain.  Ice 20 minutes 2 times a day Call me in 2 days if not better otherwise I will see you in 1 weeks.

## 2013-06-02 NOTE — Assessment & Plan Note (Signed)
Patient's low back pain that does have question will radiculopathy I am concerned with a herniated disc. We've treated her with anti-inflammatories as well as steroids. Starting home exercise program again in 48 hours. Discussed icing protocol. Patient will come back again in one week for further followup. Patient was put in light duty. Lungs patient is feeling better we can consider starting osteopathic manipulation again. Otherwise patient is still bad we need to consider an MRI. Prescription given today included meloxicam and tramadol.

## 2013-06-02 NOTE — Progress Notes (Signed)
  CC: low back pain followup  HPI: Patient is a very pleasant 63 year old obese female We'll have seen before for low back pain is coming in with an exacerbation. Patient was coming in monthly for osteopathic manipulation but has not been seen for the last 2 months that she was doing relatively well. Patient states that she has had an exacerbation over the course last week this seems to be worsening. Patient is stating that she is having some mild weakness of the right lower extremity as well. Patient states that this is a new finding. Patient states that the pain seems to be unrelenting it is in the right lower back with mild radiation to her groin as well as down her leg she states. Patient states that she can't get comfortable at night. Patient has tried some of the Norco she had previously with minimal improvement.  . X-rays reducibly showed that patient does have some mild facet degenerative changes at the couple different levels in the lumbar spine as well as a grade 1 anterior listhesis of L4.  Past medical, surgical, family and social history reviewed. Medications reviewed all in the electronic medical record.   Review of Systems: No headache, visual changes, nausea, vomiting, diarrhea, constipation, dizziness, abdominal pain, skin rash, fevers, chills, night sweats, weight loss, swollen lymph nodes, body aches, joint swelling, muscle aches, chest pain, shortness of breath, mood changes.   Objective:    Blood pressure 134/76, pulse 91, temperature 97.6 F (36.4 C), temperature source Oral, resp. rate 18, weight 256 lb 0.6 oz (116.139 kg), SpO2 96.00%.   General: No apparent distress alert and oriented x3 mood and affect normal, dressed appropriately.  HEENT: Pupils equal, extraocular movements intact Respiratory: Patient's speak in full sentences and does not appear short of breath Cardiovascular: No lower extremity edema, non tender, no erythema Skin: Warm dry intact with no signs of  infection or rash on extremities or on axial skeleton. Abdomen: Soft nontender Neuro: Cranial nerves II through XII are intact, neurovascularly intact in all extremities with 2+ DTRs and 2+ pulses. Lymph: No lymphadenopathy of posterior or anterior cervical chain or axillae bilaterally.  Gait normal with good balance and coordination.  MSK: Non tender with full range of motion and good stability and symmetric strength and tone of shoulders, elbows, wrist, hip, knee and ankles bilaterally.  Back Exam:  Inspection: Unremarkable obese Motion: Flexion 25 deg, Extension 25 deg, Side Bending to 25 deg bilaterally,  Rotation to 35 deg bilaterally  SLR laying: Negative  XSLR laying: Negative  Palpable tenderness:  lumbar paraspinal musculature on the right side as well as the SI joint.Marland Kitchen FABER: Positive right. Sensory change: Gross sensation intact to all lumbar and sacral dermatomes.  Reflexes: 2+ at both patellar tendons, 2+ at achilles tendons, Babinski's downgoing.  Strength at foot  Plantar-flexion: 5/5 Dorsi-flexion: 5/5 Eversion: 5/5 Inversion: 5/5  Leg strength  Quad: 4/5 Hamstring: 4/5 Hip flexor: 4/5 Hip abductors: 4/5  Gait unremarkable.    Impression and Recommendations:     This case required medical decision making of moderate complexity.

## 2013-06-02 NOTE — Progress Notes (Signed)
Pre-visit discussion using our clinic review tool. No additional management support is needed unless otherwise documented below in the visit note.  

## 2013-06-04 ENCOUNTER — Other Ambulatory Visit: Payer: Self-pay | Admitting: Gastroenterology

## 2013-06-06 ENCOUNTER — Ambulatory Visit (INDEPENDENT_AMBULATORY_CARE_PROVIDER_SITE_OTHER)
Admission: RE | Admit: 2013-06-06 | Discharge: 2013-06-06 | Disposition: A | Discharge status: Home / Self Care | Payer: 59 | Source: Ambulatory Visit | Attending: Endocrinology | Admitting: Endocrinology

## 2013-06-06 ENCOUNTER — Ambulatory Visit (INDEPENDENT_AMBULATORY_CARE_PROVIDER_SITE_OTHER): Payer: 59 | Admitting: Internal Medicine

## 2013-06-06 ENCOUNTER — Encounter: Payer: Self-pay | Admitting: Internal Medicine

## 2013-06-06 VITALS — BP 130/84 | HR 68 | Temp 98.3°F | Resp 16 | Wt 251.0 lb

## 2013-06-06 DIAGNOSIS — L708 Other acne: Secondary | ICD-10-CM

## 2013-06-06 DIAGNOSIS — N951 Menopausal and female climacteric states: Secondary | ICD-10-CM

## 2013-06-06 DIAGNOSIS — L989 Disorder of the skin and subcutaneous tissue, unspecified: Secondary | ICD-10-CM

## 2013-06-06 DIAGNOSIS — L700 Acne vulgaris: Secondary | ICD-10-CM

## 2013-06-06 NOTE — Patient Instructions (Addendum)
    Postprocedure instructions :     You can take a shower tomorrow.  Keep the wounds clean. You can wash them with liquid soap and water. Pat dry with gauze or a Kleenex tissue  Before applying antibiotic ointment and a Band-Aid.   You need to report immediately  if fever, chills or any signs of infection develop.    The biopsy results should be available in 1 -2 weeks.

## 2013-06-06 NOTE — Progress Notes (Deleted)
Pre visit review using our clinic review tool, if applicable. No additional management support is needed unless otherwise documented below in the visit note. 

## 2013-06-07 ENCOUNTER — Encounter: Payer: Self-pay | Admitting: Internal Medicine

## 2013-06-07 DIAGNOSIS — L700 Acne vulgaris: Secondary | ICD-10-CM | POA: Insufficient documentation

## 2013-06-07 NOTE — Assessment & Plan Note (Addendum)
2/15 chest - large (verbal consent). Procedure: Removed w/comedone extractor under sterile conditions. Tolerated well. Complications - none. >20 min

## 2013-06-07 NOTE — Assessment & Plan Note (Addendum)
2/15 multiple skin tags Procedure: Multiple skin tags were treated w/hyfercator on the neck, chest and under breasts (verbal consent). Tolerated well. Complications - none. >20 min

## 2013-06-07 NOTE — Progress Notes (Signed)
Patient ID: Jessica [name], female   DOB: [birth year], 63 y.o.   MRN: [id number]  Skin tags Chest comedone See A/P >20 min

## 2013-06-09 ENCOUNTER — Ambulatory Visit (INDEPENDENT_AMBULATORY_CARE_PROVIDER_SITE_OTHER): Payer: 59 | Admitting: Family Medicine

## 2013-06-09 ENCOUNTER — Ambulatory Visit: Payer: 59 | Admitting: Family Medicine

## 2013-06-09 ENCOUNTER — Encounter: Payer: Self-pay | Admitting: Family Medicine

## 2013-06-09 VITALS — BP 132/74 | HR 89 | Temp 98.0°F | Resp 16 | Wt 253.0 lb

## 2013-06-09 DIAGNOSIS — M545 Low back pain, unspecified: Secondary | ICD-10-CM

## 2013-06-09 NOTE — Patient Instructions (Signed)
Good to see you.  You are getting better which is good.  Iron pill daily 316m daily.  Ice after work.  Try these exercises most days of the week Come back again in 2-3 weeks. Maybe start the manipulation. If any weakness though comes on and stay come back  sooner.

## 2013-06-09 NOTE — Progress Notes (Signed)
Pre-visit discussion using our clinic review tool. No additional management support is needed unless otherwise documented below in the visit note.  

## 2013-06-09 NOTE — Assessment & Plan Note (Signed)
Low back pain with radiculopathy. Patient's back pain seems to be improving but unfortunately still has some radiculopathy. Based on her findings I am concerned for a. S1 nerve root pressure. Patient does unable to take meloxicam or tramadol secondary to side effects. Patient will continue in the gabapentin. Patient was given other home exercises that could be beneficial. This Like to see patient again in 2 weeks. If she has continued numbness or starts having any weakness we will get further imaging. Patient is improving though we may need to consider repeating osteopathic manipulation.

## 2013-06-09 NOTE — Progress Notes (Signed)
  CC: low back pain followup  HPI: Patient is a very pleasant 63 year old obese female We'll have seen before for low back pain is coming in after exacerbation. She was corresponded to osteopathic manipulation previously. Patient was having radiculopathy at last visit which may cause concern for potential herniated disc. Patient was given anti-inflammatories as well as steroid. Patient given home exercise program as well as icing protocol. Patient states in the last week she is improved approximately 85%. Patient states that the pain is much better but continues to have the radiation down the posterior aspect of her leg almost to her ankle. Patient states she may think it is improving but very slowly. Does not notice any weakness but unfortunately these pseudo-numbness is occurring. Patient states he can wake her up at night.  . X-rays reducibly showed that patient does have some mild facet degenerative changes at the couple different levels in the lumbar spine as well as a grade 1 anterior listhesis of L4.  Past medical, surgical, family and social history reviewed. Medications reviewed all in the electronic medical record.   Review of Systems: No headache, visual changes, nausea, vomiting, diarrhea, constipation, dizziness, abdominal pain, skin rash, fevers, chills, night sweats, weight loss, swollen lymph nodes, body aches, joint swelling, muscle aches, chest pain, shortness of breath, mood changes.   Objective:    Blood pressure 132/74, pulse 89, temperature 98 F (36.7 C), temperature source Oral, resp. rate 16, weight 253 lb 0.6 oz (114.778 kg), SpO2 96.00%.   General: No apparent distress alert and oriented x3 mood and affect normal, dressed appropriately.  HEENT: Pupils equal, extraocular movements intact Respiratory: Patient's speak in full sentences and does not appear short of breath Cardiovascular: No lower extremity edema, non tender, no erythema Skin: Warm dry intact with no signs  of infection or rash on extremities or on axial skeleton. Abdomen: Soft nontender Neuro: Cranial nerves II through XII are intact, neurovascularly intact in all extremities with 2+ DTRs and 2+ pulses. Lymph: No lymphadenopathy of posterior or anterior cervical chain or axillae bilaterally.  Gait normal with good balance and coordination.  MSK: Non tender with full range of motion and good stability and symmetric strength and tone of shoulders, elbows, wrist, hip, knee and ankles bilaterally.  Back Exam:  Inspection: Unremarkable obese Motion: Flexion 25 deg, Extension 25 deg, Side Bending to 25 deg bilaterally,  Rotation to 35 deg bilaterally  SLR laying: Negative  XSLR laying: Negative  Palpable tenderness:  Nontender FABER: Positive right. Sensory change: Gross sensation intact to all lumbar and sacral dermatomes.  Reflexes: 2+ at both patellar tendons, 2+ at achilles tendons, Babinski's downgoing.  Strength at foot  Plantar-flexion: 5/5 Dorsi-flexion: 5/5 Eversion: 5/5 Inversion: 5/5  Leg strength  Quad: 4/5 Hamstring: 4/5 Hip flexor: 4/5 Hip abductors: 4/5  Gait unremarkable.    Impression and Recommendations:     This case required medical decision making of moderate complexity.

## 2013-06-17 ENCOUNTER — Other Ambulatory Visit: Payer: Self-pay | Admitting: Gastroenterology

## 2013-06-21 ENCOUNTER — Other Ambulatory Visit: Payer: Self-pay | Admitting: Gastroenterology

## 2013-06-25 ENCOUNTER — Other Ambulatory Visit: Payer: Self-pay

## 2013-06-25 MED ORDER — VALACYCLOVIR HCL 500 MG PO TABS: 500.0000 mg | ORAL_TABLET | Freq: Every day | ORAL | Status: DC

## 2013-06-30 ENCOUNTER — Ambulatory Visit: Payer: 59 | Admitting: Family Medicine

## 2013-07-21 ENCOUNTER — Ambulatory Visit: Payer: 59 | Admitting: Family Medicine

## 2013-07-30 ENCOUNTER — Other Ambulatory Visit: Payer: Self-pay

## 2013-07-30 MED ORDER — GABAPENTIN 300 MG PO CAPS: ORAL_CAPSULE | ORAL | Status: DC

## 2013-08-08 ENCOUNTER — Other Ambulatory Visit: Payer: Self-pay | Admitting: *Deleted

## 2013-08-08 MED ORDER — VALSARTAN 320 MG PO TABS: 320.0000 mg | ORAL_TABLET | Freq: Every day | ORAL | Status: DC

## 2013-08-19 ENCOUNTER — Ambulatory Visit: Payer: 59 | Admitting: Endocrinology

## 2013-08-25 ENCOUNTER — Other Ambulatory Visit: Payer: Self-pay | Admitting: Gastroenterology

## 2013-08-25 NOTE — Telephone Encounter (Signed)
Yes, ok thru 01/2014

## 2013-08-25 NOTE — Telephone Encounter (Signed)
Follow up visit is not until 02-18-2014. Patient is requesting refill on Miralax. Is it okay to refill?

## 2013-09-05 ENCOUNTER — Encounter: Payer: Self-pay | Admitting: Family Medicine

## 2013-09-05 ENCOUNTER — Ambulatory Visit (INDEPENDENT_AMBULATORY_CARE_PROVIDER_SITE_OTHER): Payer: 59 | Admitting: Family Medicine

## 2013-09-05 VITALS — BP 132/84 | HR 83

## 2013-09-05 DIAGNOSIS — M1710 Unilateral primary osteoarthritis, unspecified knee: Secondary | ICD-10-CM | POA: Insufficient documentation

## 2013-09-05 DIAGNOSIS — M545 Low back pain, unspecified: Secondary | ICD-10-CM

## 2013-09-05 MED ORDER — GABAPENTIN 300 MG PO CAPS: ORAL_CAPSULE | ORAL | Status: DC

## 2013-09-05 NOTE — Assessment & Plan Note (Signed)
Patient does have some low back pain with radiculopathy going down the anterior and lateral aspect a leg that corresponds with the L4-L5 nerve root impingement. Patient does have foraminal narrowing at this level that could be contributing. I do think patient's sleeping on core surfaces and decreased activity well she's been in the hospital probably exacerbated the underlying problem. Patient given the new anti-inflammatory medication that will have medication to protect her stomach to see if this would be helpful. In addition to that we discussed icing protocol and starting home exercises again in 72 hours. Patient come back again in 2 weeks for further followup.

## 2013-09-05 NOTE — Patient Instructions (Signed)
Good to see you Ice 20 minutes 2 times a day to back and knee Try exercises in 3 days and then do it 3 times a week.  Try the medicine Duexisis 3 times daily for next 6 days Increase gabapentin to 1 pill three times daily sent in new prescription Come back again in 2-3 weeks.

## 2013-09-05 NOTE — Assessment & Plan Note (Signed)
Patient had injection as described above. Patient is' findings and was given home exercise program. Patient will try the medications as we've discussed previously. We discussed icing protocol. We discussed bracing the patient declined a day as well as formal physical therapy. Patient would still need to do those things but likely has a significant amount of underlying arthritis. Repeat x-rays may be necessary if pain continues as well as formal physical therapy. Patient likely will be a candidate for viscous supplementation in the future as well.  Spent greater than 25 minutes with patient face-to-face and had greater than 50% of counseling including as described above in assessment and plan.

## 2013-09-05 NOTE — Progress Notes (Signed)
CC: low back pain followup  HPI: Patient is a very pleasant 63 year old obese female We'll have seen before for low back pain is coming in after exacerbation. She has unfortunately been in the hospital with her husband who had a brain cyst and had complications. Patient had been sleeping on a futon for the last 2 weeks. Patient states that this has exacerbated her low back pain mostly on the right with radiculopathy going down the right leg. Patient states that this is also affecting her knee which she has known arthritis in. Denies any numbness though. Patient states that this is make it very difficult emulated and does have nighttime awakening. Patient has tried taking tramadol as was the meloxicam it does upset her stomach. Patient is wondering if there is anything else that can be used.  . X-rays previously showed that patient does have some mild facet degenerative changes at the couple different levels in the lumbar spine as well as a grade 1 anterior listhesis of L4.  Past medical, surgical, family and social history reviewed. Medications reviewed all in the electronic medical record.   Review of Systems: No headache, visual changes, nausea, vomiting, diarrhea, constipation, dizziness, abdominal pain, skin rash, fevers, chills, night sweats, weight loss, swollen lymph nodes, body aches, joint swelling, muscle aches, chest pain, shortness of breath, mood changes.   Objective:    Blood pressure 132/84, pulse 83, SpO2 97.00%.   General: No apparent distress alert and oriented x3 mood and affect normal, dressed appropriately.  HEENT: Pupils equal, extraocular movements intact Respiratory: Patient's speak in full sentences and does not appear short of breath Cardiovascular: No lower extremity edema, non tender, no erythema Skin: Warm dry intact with no signs of infection or rash on extremities or on axial skeleton. Abdomen: Soft nontender Neuro: Cranial nerves II through XII are intact,  neurovascularly intact in all extremities with 2+ DTRs and 2+ pulses. Lymph: No lymphadenopathy of posterior or anterior cervical chain or axillae bilaterally.  Gait normal with good balance and coordination.  MSK: Non tender with full range of motion and good stability and symmetric strength and tone of shoulders, elbows, wrist, hip, and ankles bilaterally.  Back Exam:  Inspection: Unremarkable , obese Motion: Flexion 25 deg, Extension 25 deg, Side Bending to 25 deg bilaterally,  Rotation to 35 deg bilaterally  SLR laying: Mildly positive right side  XSLR laying: Negative  Palpable tenderness:  Severe tenderness on the right paraspinal musculature of the lumbar spine FABER: Positive right. Sensory change: Gross sensation intact to all lumbar and sacral dermatomes.  Reflexes: 2+ at both patellar tendons, 2+ at achilles tendons, Babinski's downgoing.  Strength at foot  Plantar-flexion: 5/5 Dorsi-flexion: 5/5 Eversion: 5/5 Inversion: 5/5  Leg strength  Quad: 4/5 Hamstring: 4/5 Hip flexor: 4/5 Hip abductors: 4/5  Gait unremarkable. Knee: Right Difficult to assess secondary to patient's obesity Palpation normal with no warmth, patient does have severe medial joint line tenderness ROM full in flexion and extension and lower leg rotation. Ligaments with solid consistent endpoints including ACL, PCL, LCL, MCL. Positive Mcmurray's, Apley's, and Thessalonian tests. Painful patellar compression. Patellar glide with moderate crepitus. Patellar and quadriceps tendons unremarkable. Hamstring and quadriceps strength is normal.  Contralateral knee also has some medial and joint tenderness as well as osteoarthritic changes.   Procedure: Real-time Ultrasound Guided Injection of right knee Device: GE Logiq E  Ultrasound guided injection is preferred based studies that show increased duration, increased effect, greater accuracy, decreased procedural pain, increased response rate,  and decreased cost  with ultrasound guided versus blind injection.  Verbal informed consent obtained.  Time-out conducted.  Noted no overlying erythema, induration, or other signs of local infection.  Skin prepped in a sterile fashion.  Local anesthesia: Topical Ethyl chloride.  With sterile technique and under real time ultrasound guidance: With a 22-gauge 2 inch needle patient was injected with 4 cc of 0.5% Marcaine and 1 cc of Kenalog 40 mg/dL. This was from a superior lateral approach.  Completed without difficulty  Pain immediately resolved suggesting accurate placement of the medication.  Advised to call if fevers/chills, erythema, induration, drainage, or persistent bleeding.  Images permanently stored and available for review in the ultrasound unit.  Impression: Technically successful ultrasound guided injection.   Impression and Recommendations:     This case required medical decision making of moderate complexity.

## 2013-09-18 ENCOUNTER — Other Ambulatory Visit: Payer: Self-pay | Admitting: Obstetrics and Gynecology

## 2013-09-23 ENCOUNTER — Other Ambulatory Visit: Payer: Self-pay | Admitting: Gastroenterology

## 2013-09-24 NOTE — Telephone Encounter (Signed)
Patient had office visit 03-2013. Patient is requesting refill on Dexilant. Is it okay to refill?

## 2013-09-24 NOTE — Telephone Encounter (Signed)
okto refill x 7- let pt know to schedule office visit with one of our MD's before the end of the year to get established with new GI MD

## 2013-10-01 ENCOUNTER — Encounter: Payer: Self-pay | Admitting: Family Medicine

## 2013-10-01 ENCOUNTER — Ambulatory Visit (INDEPENDENT_AMBULATORY_CARE_PROVIDER_SITE_OTHER): Payer: 59 | Admitting: Family Medicine

## 2013-10-01 ENCOUNTER — Ambulatory Visit (INDEPENDENT_AMBULATORY_CARE_PROVIDER_SITE_OTHER)
Admission: RE | Admit: 2013-10-01 | Discharge: 2013-10-01 | Disposition: A | Discharge status: Home / Self Care | Payer: 59 | Source: Ambulatory Visit | Attending: Family Medicine | Admitting: Family Medicine

## 2013-10-01 VITALS — BP 130/76 | HR 80 | Ht 65.0 in | Wt 254.0 lb

## 2013-10-01 DIAGNOSIS — M1710 Unilateral primary osteoarthritis, unspecified knee: Secondary | ICD-10-CM

## 2013-10-01 DIAGNOSIS — M549 Dorsalgia, unspecified: Secondary | ICD-10-CM

## 2013-10-01 DIAGNOSIS — M25569 Pain in unspecified knee: Secondary | ICD-10-CM

## 2013-10-01 MED ORDER — IBUPROFEN-FAMOTIDINE 800-26.6 MG PO TABS: 1.0000 | ORAL_TABLET | Freq: Two times a day (BID) | ORAL | Status: DC

## 2013-10-01 NOTE — Progress Notes (Signed)
  CC: Right knee followup  HPI: Patient is a very pleasant 63 year old obese female Coming in for followup of her right knee pain. Patient was seen previously and states that the injection that was given to her in her knee improve the patient about 80%. Patient has been able to him doing much better and is not having as much pain. Patient is noticing more pain on the contralateral side. Patient denies any swelling or numbness or any other new symptoms. Patient is very happy overall with the results.  Past medical, surgical, family and social history reviewed. Medications reviewed all in the electronic medical record.   Review of Systems: No headache, visual changes, nausea, vomiting, diarrhea, constipation, dizziness, abdominal pain, skin rash, fevers, chills, night sweats, weight loss, swollen lymph nodes, body aches, joint swelling, muscle aches, chest pain, shortness of breath, mood changes.   Objective:    Blood pressure 130/76, pulse 80, height 5' 5"  (1.651 m), weight 254 lb (115.214 kg), SpO2 98.00%.   General: No apparent distress alert and oriented x3 mood and affect normal, dressed appropriately.  HEENT: Pupils equal, extraocular movements intact Respiratory: Patient's speak in full sentences and does not appear short of breath Cardiovascular: No lower extremity edema, non tender, no erythema Skin: Warm dry intact with no signs of infection or rash on extremities or on axial skeleton. Abdomen: Soft nontender Neuro: Cranial nerves II through XII are intact, neurovascularly intact in all extremities with 2+ DTRs and 2+ pulses. Lymph: No lymphadenopathy of posterior or anterior cervical chain or axillae bilaterally.  Gait valgus deformity  MSK: Non tender with full range of motion and good stability and symmetric strength and tone of shoulders, elbows, wrist, hip, and ankles bilaterally.  Knee: Right Difficult to assess secondary to patient's obesity Palpation normal with no warmth,  patient does have severe medial joint line tenderness ROM full in flexion and extension and lower leg rotation. Ligaments with solid consistent endpoints including ACL, PCL, LCL, MCL. Positive Mcmurray's, Apley's, and Thessalonian tests. Painful patellar compression. Patellar glide with moderate crepitus. Patellar and quadriceps tendons unremarkable. Hamstring and quadriceps strength is normal.  Contralateral knee also has some medial and joint tenderness as well as osteoarthritic changes. Patient also has valgus deformity.   Impression and Recommendations:     This case required medical decision making of moderate complexity.

## 2013-10-01 NOTE — Patient Instructions (Addendum)
It is good to see you Ice 20 minutes 2 times daily.  Xrays downstairs today of knees Wear brace with activity  Continue exercises 3 times a week and work on the other knee Physical therapy will be calling you Have fun this summer  See you again in 4-6 weeks.

## 2013-10-01 NOTE — Assessment & Plan Note (Signed)
Patient is doing significantly better after injection into the right knee. Patient started to feel the left knee somewhat numbness and has significant osteoarthritis in this knee as well. Patient will get x-rays today. We discussed continued home exercises but was sent to formal physical therapy as well. Patient was also fitted with a brace today for the right knee. This will be a medial unloader brace. Patient's will try these interventions and come back and see me again in 4-6 weeks for further evaluation.  Spent greater than 25 minutes with patient face-to-face and had greater than 50% of counseling including as described above in assessment and plan.

## 2013-10-14 ENCOUNTER — Other Ambulatory Visit: Payer: Self-pay | Admitting: Gastroenterology

## 2013-10-15 NOTE — Telephone Encounter (Signed)
Nevin Bloodgood,   Patient's last office visit was 01-2013. Patient is not for a yearly office visit until 01-2014. Patient is requesting refill on Levsin, is it okay to refill?

## 2013-10-17 ENCOUNTER — Telehealth: Payer: Self-pay | Admitting: *Deleted

## 2013-10-17 MED ORDER — HYOSCYAMINE SULFATE 0.125 MG SL SUBL: 0.1250 mg | SUBLINGUAL_TABLET | SUBLINGUAL | Status: DC | PRN

## 2013-10-17 NOTE — Telephone Encounter (Signed)
Patient was last seen 02-18-2013 Patient is requesting refill on Hyoscyamine Is it okay to refill?

## 2013-10-17 NOTE — Telephone Encounter (Signed)
Yes, ok to refill.  Thank you,  Jess

## 2013-11-13 ENCOUNTER — Other Ambulatory Visit: Payer: Self-pay

## 2013-11-13 MED ORDER — VALSARTAN 320 MG PO TABS: 320.0000 mg | ORAL_TABLET | Freq: Every day | ORAL | Status: DC

## 2013-11-14 ENCOUNTER — Ambulatory Visit: Payer: 59 | Admitting: Family Medicine

## 2013-11-23 ENCOUNTER — Other Ambulatory Visit: Payer: Self-pay | Admitting: Gastroenterology

## 2013-12-15 ENCOUNTER — Ambulatory Visit (INDEPENDENT_AMBULATORY_CARE_PROVIDER_SITE_OTHER): Payer: 59 | Admitting: Family Medicine

## 2013-12-15 ENCOUNTER — Other Ambulatory Visit: Payer: Self-pay

## 2013-12-15 ENCOUNTER — Encounter: Payer: Self-pay | Admitting: Family Medicine

## 2013-12-15 VITALS — BP 142/84 | HR 79 | Ht 65.0 in | Wt 252.0 lb

## 2013-12-15 DIAGNOSIS — M1710 Unilateral primary osteoarthritis, unspecified knee: Secondary | ICD-10-CM

## 2013-12-15 DIAGNOSIS — M1711 Unilateral primary osteoarthritis, right knee: Secondary | ICD-10-CM

## 2013-12-15 MED ORDER — ATORVASTATIN CALCIUM 40 MG PO TABS: ORAL_TABLET | ORAL | Status: DC

## 2013-12-15 NOTE — Progress Notes (Signed)
CC: Right knee followup  HPI: Patient is a very pleasant 63 year old obese female Coming in for followup of her right knee pain. 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 Patient's last injection into the right knee greater than 3 months ago. Patient has done formal physical therapy, home exercises, icing protocol as well as bracing. Patient states she has not been able to do any of these over the course last 2 months because she has been in the hospital with her husband. Patient states that it is ride back to their own house over the course of the last week. Patient states that he is doing better. Patient states that the knee unfortunately is not doing as well. Patient states that all the pain seems to be back. Patient is ambulating with a limp. Patient is having to return to work in his concern that this is coming given exacerbated the problem more. Patient like to stop aspirin as well because she feels like it may be hurting her stomach. Patient is concerned overall.    Medications reviewed all in the electronic medical record.   Review of Systems: No headache, visual changes, nausea, vomiting, diarrhea, constipation, dizziness, abdominal pain, skin rash, fevers, chills, night sweats, weight loss, swollen lymph nodes, body aches, joint swelling, muscle aches, chest pain, shortness of breath, mood changes.   Objective:    Blood pressure 142/84, pulse 79, height 5' 5"  (1.651 m), weight 252 lb (114.306 kg), SpO2 97.00%.   General: No apparent distress alert and oriented x3 mood and affect normal, dressed appropriately.  HEENT: Pupils equal, extraocular movements intact Respiratory: Patient's speak in full sentences and does not appear short of breath Cardiovascular: No lower extremity edema, non tender, no erythema Skin: Warm dry intact with no signs of infection or rash on extremities or on axial skeleton. Abdomen: Soft nontender Neuro: Cranial nerves II  through XII are intact, neurovascularly intact in all extremities with 2+ DTRs and 2+ pulses. Lymph: No lymphadenopathy of posterior or anterior cervical chain or axillae bilaterally.  Gait valgus deformity  MSK: Non tender with full range of motion and good stability and symmetric strength and tone of shoulders, elbows, wrist, hip, and ankles bilaterally.  Knee: Right Difficult to assess secondary to patient's obesity Palpation normal with no warmth, patient does have severe medial joint line tenderness ROM full in flexion and extension and lower leg rotation. Ligaments with solid consistent endpoints including ACL, PCL, LCL, MCL. Positive Mcmurray's, Apley's, and Thessalonian tests. Painful patellar compression. Patellar glide with moderate crepitus. Patellar and quadriceps tendons unremarkable. Hamstring and quadriceps strength is normal.  Contralateral knee also has some medial and joint tenderness as well as osteoarthritic changes. Patient also has valgus deformity.  Procedure: Real-time Ultrasound Guided Injection of right knee Device: GE Logiq E  Ultrasound guided injection is preferred based studies that show increased duration, increased effect, greater accuracy, decreased procedural pain, increased response rate, and decreased cost with ultrasound guided versus blind injection.  Verbal informed consent obtained.  Time-out conducted.  Noted no overlying erythema, induration, or other signs of local infection.  Skin prepped in a sterile fashion.  Local anesthesia: Topical Ethyl chloride.  With sterile technique and under real time ultrasound guidance: With a 22-gauge 2 inch needle patient was injected with 4 cc of 0.5% Marcaine and 1 cc of Kenalog 40 mg/dL. This was from a superior lateral approach.  Completed without difficulty  Pain immediately resolved suggesting accurate placement of the medication.  Advised  to call if fevers/chills, erythema, induration, drainage, or  persistent bleeding.  Images permanently stored and available for review in the ultrasound unit.  Impression: Technically successful ultrasound guided injection.   Impression and Recommendations:     This case required medical decision making of moderate complexity.

## 2013-12-15 NOTE — Assessment & Plan Note (Signed)
Patient was given another injection today. Patient does have severe osteoarthritic changes of the knees bilaterally right greater than left. Discuss doing icing protocol as well as wearing the brace. Patient was showed proper technique on how to wear the brace again. We discussed the importance of medications in patient will try topical medication to avoid the stomach upset. Patient has had an allergy to anti-inflammatories and warned of potential side effects but she will try. Discussed with patient that if not improving she may be a candidate for viscous supplementation. Patient is going to restart formal physical therapy. Patient come back in 3-4 weeks for further evaluation and treatment.  Spent greater than 25 minutes with patient face-to-face and had greater than 50% of counseling including as described above in assessment and plan.

## 2013-12-15 NOTE — Patient Instructions (Signed)
Good to see you Continue the icing and the home exercises at least 3 times a week.  Physical therapy will be great.  Wear the brace with a lot of activity.  Come back in 1 month.  If not much better will start synvisc.

## 2013-12-19 ENCOUNTER — Ambulatory Visit (INDEPENDENT_AMBULATORY_CARE_PROVIDER_SITE_OTHER): Payer: 59 | Admitting: Endocrinology

## 2013-12-19 ENCOUNTER — Encounter: Payer: Self-pay | Admitting: Endocrinology

## 2013-12-19 VITALS — BP 132/64 | HR 83 | Temp 98.5°F | Ht 65.0 in | Wt 249.0 lb

## 2013-12-19 DIAGNOSIS — Z119 Encounter for screening for infectious and parasitic diseases, unspecified: Secondary | ICD-10-CM

## 2013-12-19 DIAGNOSIS — E119 Type 2 diabetes mellitus without complications: Secondary | ICD-10-CM

## 2013-12-19 MED ORDER — INSULIN ASPART PROT & ASPART (70-30 MIX) 100 UNIT/ML PEN: PEN_INJECTOR | SUBCUTANEOUS | Status: DC

## 2013-12-19 NOTE — Progress Notes (Signed)
Subjective:    Patient ID: Jessica [name], female    DOB: 01/29/1951, 63 y.o.   MRN: [id number]  HPI Pt returns for f/u of IDDM (dx'ed 2000, when she presented with monilial vaginitis; complicated by autonomic neuropathy; she needs the simpler bid insulin regimen; she has never had pancreatitis, severe hypoglycemia, or DKA).  Pt says her ability to care for her DM has been compromised by her husband's illness.  He feels better now.  no cbg record, but states cbg's vary from 62-200's.  She wants to be tested for syphilis.   Past Medical History  Diagnosis Date  . Asthma     childhood  . Hemorrhoids   . Autonomic neuropathy     diabetic  . Hypertension   . Gastroparesis   . Unspecified gastritis and gastroduodenitis without mention of hemorrhage   . Personal history of colonic polyps 03/2010    hyperplastic  . Chest pain     intermittant left sided chest pain, non-exertional, lasting 1-2 min  . Nasal congestion current    sore throat, ears-on antibiotic  . PONV (postoperative nausea and vomiting)   . Diabetes mellitus     type II, Hemoglobin A1C 9.9 10/05/2011  . GERD (gastroesophageal reflux disease)     Past Surgical History  Procedure Laterality Date  . Tubal ligation    . Appendectomy    . Left breast cyst removal  2000  . Dilatation & currettage/hysteroscopy with resectocope N/A 03/24/2013    Procedure: DILATATION & CURETTAGE, HYSTEROSCOPY WITH RESECTION;  Surgeon: Olga Millers, MD;  Location: [facility];  Service: Gynecology;  Laterality: N/A;    History   Social History  . Marital Status: Married    Spouse Name: Devar Gater    Number of Children: 2  . Years of Education: N/A   Occupational History  . works for school system    Social History Main Topics  . Smoking status: Never Smoker   . Smokeless tobacco: Never Used  . Alcohol Use: No  . Drug Use: No  . Sexual Activity: Not on file   Other Topics Concern  . Not on file   Social History Narrative  .  No narrative on file    Current Outpatient Prescriptions on File Prior to Visit  Medication Sig Dispense Refill  . atorvastatin (LIPITOR) 40 MG tablet TAKE 1 TABLET (40 MG TOTAL) BY MOUTH DAILY.  30 tablet  1  . B-D ULTRAFINE III SHORT PEN 31G X 8 MM MISC USE AS DIRECTED 4 TIMES DAILY.  130 each  2  . clidinium-chlordiazePOXIDE (LIBRAX) 5-2.5 MG per capsule Take 1 capsule by mouth 3 (three) times daily as needed.  100 capsule  6  . DEXILANT 60 MG capsule TAKE ONE CAPSULE BY MOUTH EVERY DAY  30 capsule  6  . gabapentin (NEURONTIN) 300 MG capsule 1 pill 3 times daily  180 capsule  3  . glucose blood (ONE TOUCH ULTRA TEST) test strip Use as directed twice daily dx 250.00  100 each  3  . hyoscyamine (LEVSIN SL) 0.125 MG SL tablet Take 1 tablet (0.125 mg total) by mouth every 4 (four) hours as needed.  30 tablet  1  . Ibuprofen-Famotidine 800-26.6 MG TABS Take 1 tablet by mouth 2 (two) times daily.  180 tablet  1  . polyethylene glycol powder (GLYCOLAX/MIRALAX) powder USE 1 SCOOP AS DIRECTED 1-2 TIMES DAILY  527 g  2  . valACYclovir (VALTREX) 500 MG tablet Take 1  tablet (500 mg total) by mouth daily.  60 tablet  3  . valsartan (DIOVAN) 320 MG tablet Take 1 tablet (320 mg total) by mouth daily.  30 tablet  2   No current facility-administered medications on file prior to visit.    Allergies  Allergen Reactions  . Nsaids Nausea And Vomiting    Cannot tolerate large doses     Family History  Problem Relation Age of Onset  . Colon cancer Sister   . Diabetes Sister   . Diabetes Brother   . Heart disease Father   . Other Father     2 collapsed lungs   BP 132/64  Pulse 83  Temp(Src) 98.5 F (36.9 C) (Oral)  Ht 5' 5"  (1.651 m)  Wt 249 lb (112.946 kg)  BMI 41.44 kg/m2  SpO2 95%  Review of Systems She denies hypoglycemia.  She has lost a few lbs.      Objective:   Physical Exam VITAL SIGNS:  See vs page GENERAL: no distress Pulses: dorsalis pedis intact bilat.   Feet: no  deformity. normal color and temp.  no edema.   Skin:  no ulcer on the feet.   Neuro: sensation is intact to touch on the feet.   Lab Results  Component Value Date   HGBA1C 8.0* 12/19/2013      Assessment & Plan:  DM: moderate exacerbation.  Noncompliance with cbg recording and f/u appts, worse: I'll work around this as best I can.     Patient is advised the following: Patient Instructions  check your blood sugar twice a day.  vary the time of day when you check, between before the 3 meals, and at bedtime.  also check if you have symptoms of your blood sugar being too high or too low.  please keep a record of the readings and bring it to your next appointment here.  You can write it on any piece of paper.  please call us sooner if your blood sugar goes below 70, or if you have a lot of readings over 200. blood tests are being requested for you today.  We'll contact you with results. Please come back for a follow-up appointment in 3 months.

## 2013-12-19 NOTE — Patient Instructions (Addendum)
check your blood sugar twice a day.  vary the time of day when you check, between before the 3 meals, and at bedtime.  also check if you have symptoms of your blood sugar being too high or too low.  please keep a record of the readings and bring it to your next appointment here.  You can write it on any piece of paper.  please call us sooner if your blood sugar goes below 70, or if you have a lot of readings over 200. blood tests are being requested for you today.  We'll contact you with results. Please come back for a follow-up appointment in 3 months.

## 2013-12-20 LAB — HIV ANTIBODY (ROUTINE TESTING W REFLEX): HIV 1&2 Ab, 4th Generation: NONREACTIVE

## 2013-12-20 LAB — HEMOGLOBIN A1C: Hgb A1c MFr Bld: 8 % — ABNORMAL HIGH (ref 4.6–6.5)

## 2013-12-20 LAB — RPR

## 2013-12-21 ENCOUNTER — Other Ambulatory Visit: Payer: Self-pay | Admitting: Endocrinology

## 2013-12-21 MED ORDER — INSULIN ASPART PROT & ASPART (70-30 MIX) 100 UNIT/ML PEN: PEN_INJECTOR | SUBCUTANEOUS | Status: DC

## 2014-01-19 ENCOUNTER — Ambulatory Visit: Payer: 59 | Admitting: Family Medicine

## 2014-01-19 DIAGNOSIS — Z0289 Encounter for other administrative examinations: Secondary | ICD-10-CM

## 2014-01-21 ENCOUNTER — Telehealth: Payer: Self-pay | Admitting: Endocrinology

## 2014-01-21 NOTE — Telephone Encounter (Signed)
Please reduce the insulin to 110 units with breakfast, and 60 units with the evening meal. Please call if the lows happen again.

## 2014-01-21 NOTE — Telephone Encounter (Signed)
Blood sugars are running too low overnight and in the afternoon. It has dropped to 43 overnight

## 2014-01-21 NOTE — Telephone Encounter (Signed)
Contacted pt. She states for the lat 3 nights her sugars have been dropping on 9/20 43 9/22 dropped to 50, and on 9/21dropped to 52. Pt states that her morning readings have been mid 100s. Confirmed that pt is taking 110 units of Novolog with breakfast and 80 units with supper .  Please advise, Thanks!

## 2014-01-22 NOTE — Telephone Encounter (Signed)
Lvom advising pt of new instructions. And requested call back if she would like to discuss.

## 2014-01-28 ENCOUNTER — Telehealth: Payer: Self-pay | Admitting: *Deleted

## 2014-01-28 DIAGNOSIS — M549 Dorsalgia, unspecified: Secondary | ICD-10-CM

## 2014-01-28 DIAGNOSIS — M1710 Unilateral primary osteoarthritis, unspecified knee: Secondary | ICD-10-CM

## 2014-01-28 DIAGNOSIS — M25569 Pain in unspecified knee: Secondary | ICD-10-CM

## 2014-01-28 NOTE — Telephone Encounter (Signed)
Dr. Tamala Julian needed referral to be place for PT.../lmb

## 2014-02-02 ENCOUNTER — Encounter: Payer: Self-pay | Admitting: Family Medicine

## 2014-02-02 ENCOUNTER — Ambulatory Visit (INDEPENDENT_AMBULATORY_CARE_PROVIDER_SITE_OTHER): Payer: 59 | Admitting: Family Medicine

## 2014-02-02 VITALS — BP 122/72 | HR 79 | Ht 65.0 in

## 2014-02-02 DIAGNOSIS — M1711 Unilateral primary osteoarthritis, right knee: Secondary | ICD-10-CM

## 2014-02-02 NOTE — Patient Instructions (Signed)
Good to see you Ice is your friend Continue the bracing and the exercises.  Continue physical therapy if you can.  See you next week

## 2014-02-02 NOTE — Assessment & Plan Note (Signed)
Patient was given a Synvisc injection today. Patient will come back next week for the second injection. Patient will continue with all other conservative measures at this time. Patient has failed all other conservative measure but we will continue to try. Patient will see me again once again in one week.

## 2014-02-02 NOTE — Progress Notes (Signed)
CC: Right knee followup  HPI: Patient is a very pleasant 63 year old obese female Coming in for followup of her right knee pain. 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 Patient's last injection into the right knee. Patient has had a total of 2 cortical steroid injections into his knee. Last one was about one month ago. Patient is continued on a conservative therapy including home as well as formal physical therapy, icing protocol, bracing, as well as topical anti-inflammatories. Patient has at this point failed all other conservative therapy. Patient states she continues to have significant amount of pain and is having more internal derangement type symptoms such as locking. Patient has unfortunately had to ambulate with the aid of a cane from time to time. Patient continues all her other therapies and has noticed some mild improvement when she does her physical therapy but due to financial constraints she finds it difficult to go on a regular basis. Patient still states that the pain wakes her up at night. Patient is still adamant that she would like to avoid surgical intervention.    Medications reviewed all in the electronic medical record.   Review of Systems: No headache, visual changes, nausea, vomiting, diarrhea, constipation, dizziness, abdominal pain, skin rash, fevers, chills, night sweats, weight loss, swollen lymph nodes, body aches, joint swelling, muscle aches, chest pain, shortness of breath, mood changes.   Objective:    Blood pressure 122/72, pulse 79, height 5' 5"  (1.651 m), SpO2 97.00%.   General: No apparent distress alert and oriented x3 mood and affect normal, dressed appropriately.  HEENT: Pupils equal, extraocular movements intact Respiratory: Patient's speak in full sentences and does not appear short of breath Cardiovascular: No lower extremity edema, non tender, no erythema Skin: Warm dry intact with no signs of infection or rash on  extremities or on axial skeleton. Abdomen: Soft nontender Neuro: Cranial nerves II through XII are intact, neurovascularly intact in all extremities with 2+ DTRs and 2+ pulses. Lymph: No lymphadenopathy of posterior or anterior cervical chain or axillae bilaterally.  Gait valgus deformity  MSK: Non tender with full range of motion and good stability and symmetric strength and tone of shoulders, elbows, wrist, hip, and ankles bilaterally.  Knee: Right Difficult to assess secondary to patient's obesity Palpation normal with no warmth, patient does have severe medial joint line tenderness ROM full in flexion and extension and lower leg rotation. Ligaments with solid consistent endpoints including ACL, PCL, LCL, MCL. Positive Mcmurray's, Apley's, and Thessalonian tests. Painful patellar compression. Patellar glide with moderate crepitus. Patellar and quadriceps tendons unremarkable. Hamstring and quadriceps strength is normal.  Contralateral knee also has some medial and joint tenderness as well as osteoarthritic changes. Patient also has valgus deformity.  Procedure: Real-time Ultrasound Guided Injection of right knee Device: GE Logiq E  Ultrasound guided injection is preferred based studies that show increased duration, increased effect, greater accuracy, decreased procedural pain, increased response rate, and decreased cost with ultrasound guided versus blind injection.  Verbal informed consent obtained.  Time-out conducted.  Noted no overlying erythema, induration, or other signs of local infection.  Skin prepped in a sterile fashion.  Local anesthesia: Topical Ethyl chloride.  With sterile technique and under real time ultrasound guidance: With a 22-gauge 2 inch needle patient was injected 16 mg/2.5 mL of Synvisc (sodium hyaluronate) in a prefilled syringe was injected easily into the knee through a 22-gauge needle. This was from a superior lateral approach.  Completed without difficulty  Pain immediately resolved suggesting accurate placement of the medication.  Advised to call if fevers/chills, erythema, induration, drainage, or persistent bleeding.  Images permanently stored and available for review in the ultrasound unit.  Impression: Technically successful ultrasound guided injection.   Impression and Recommendations:     This case required medical decision making of moderate complexity.

## 2014-02-08 ENCOUNTER — Other Ambulatory Visit: Payer: Self-pay | Admitting: Endocrinology

## 2014-02-09 ENCOUNTER — Ambulatory Visit (INDEPENDENT_AMBULATORY_CARE_PROVIDER_SITE_OTHER): Payer: 59 | Admitting: Family Medicine

## 2014-02-09 ENCOUNTER — Encounter: Payer: Self-pay | Admitting: Family Medicine

## 2014-02-09 VITALS — BP 138/64 | HR 87 | Ht 65.0 in | Wt 249.0 lb

## 2014-02-09 DIAGNOSIS — M1711 Unilateral primary osteoarthritis, right knee: Secondary | ICD-10-CM

## 2014-02-09 NOTE — Assessment & Plan Note (Signed)
Patient was given second in a series of 3 injections into the right knee today. Patient tolerated the procedure very well. Patient will continue with the conservative therapy including bracing, icing, home exercises and will come back again in one week for third and final injection.

## 2014-02-09 NOTE — Patient Instructions (Signed)
Good to see you Ice is still good New exercises for yor knee  See you again in 1 week.

## 2014-02-09 NOTE — Progress Notes (Signed)
CC: Right knee followup  HPI: Patient is a very pleasant 63 year old obese female Coming in for followup of her right knee pain. 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 Patient's last injection into the right knee. Patient has had a total of 2 cortical steroid injections into his knee. Last one was about one month ago. Patient is continued on a conservative therapy including home as well as formal physical therapy, icing protocol, bracing, as well as topical anti-inflammatories. Patient has at this point failed all other conservative therapy. Patient states she continues to have significant amount of pain and is having more internal derangement type symptoms such as locking. Patient has unfortunately had to ambulate with the aid of a cane from time to time. Patient continues all her other therapies and has noticed some mild improvement when she does her physical therapy but due to financial constraints she finds it difficult to go on a regular basis. Patient still states that the pain wakes her up at night. Patient is still adamant that she would like to avoid surgical intervention. Patient was started on Synvisc at last visit. Patient states that there has not been any significant improvement.    Medications reviewed all in the electronic medical record.   Review of Systems: No headache, visual changes, nausea, vomiting, diarrhea, constipation, dizziness, abdominal pain, skin rash, fevers, chills, night sweats, weight loss, swollen lymph nodes, body aches, joint swelling, muscle aches, chest pain, shortness of breath, mood changes.   Objective:    Blood pressure 138/64, pulse 87, height 5' 5"  (1.651 m), weight 249 lb (112.946 kg), SpO2 98.00%.   General: No apparent distress alert and oriented x3 mood and affect normal, dressed appropriately.  HEENT: Pupils equal, extraocular movements intact Respiratory: Patient's speak in full sentences and does not appear  short of breath Cardiovascular: No lower extremity edema, non tender, no erythema Skin: Warm dry intact with no signs of infection or rash on extremities or on axial skeleton. Abdomen: Soft nontender Neuro: Cranial nerves II through XII are intact, neurovascularly intact in all extremities with 2+ DTRs and 2+ pulses. Lymph: No lymphadenopathy of posterior or anterior cervical chain or axillae bilaterally.  Gait valgus deformity  MSK: Non tender with full range of motion and good stability and symmetric strength and tone of shoulders, elbows, wrist, hip, and ankles bilaterally.  Knee: Right Difficult to assess secondary to patient's obesity Palpation normal with no warmth, patient does have severe medial joint line tenderness ROM full in flexion and extension and lower leg rotation. Ligaments with solid consistent endpoints including ACL, PCL, LCL, MCL. Positive Mcmurray's, Apley's, and Thessalonian tests. Painful patellar compression. Patellar glide with moderate crepitus. Patellar and quadriceps tendons unremarkable. Hamstring and quadriceps strength is normal.  Contralateral knee also has some medial and joint tenderness as well as osteoarthritic changes. Patient also has valgus deformity.  Procedure: Real-time Ultrasound Guided Injection of right knee Device: GE Logiq E  Ultrasound guided injection is preferred based studies that show increased duration, increased effect, greater accuracy, decreased procedural pain, increased response rate, and decreased cost with ultrasound guided versus blind injection.  Verbal informed consent obtained.  Time-out conducted.  Noted no overlying erythema, induration, or other signs of local infection.  Skin prepped in a sterile fashion.  Local anesthesia: Topical Ethyl chloride.  With sterile technique and under real time ultrasound guidance: With a 22-gauge 2 inch needle patient was injected 16 mg/2.5 mL of Synvisc (sodium hyaluronate) in a prefilled  syringe was injected easily into the knee through a 22-gauge needle. This was from a superior lateral approach.  Completed without difficulty  Pain immediately resolved suggesting accurate placement of the medication.  Advised to call if fevers/chills, erythema, induration, drainage, or persistent bleeding.  Images permanently stored and available for review in the ultrasound unit.  Impression: Technically successful ultrasound guided injection.   Impression and Recommendations:     This case required medical decision making of moderate complexity.

## 2014-02-16 ENCOUNTER — Ambulatory Visit (INDEPENDENT_AMBULATORY_CARE_PROVIDER_SITE_OTHER): Payer: 59 | Admitting: Family Medicine

## 2014-02-16 ENCOUNTER — Encounter: Payer: Self-pay | Admitting: Family Medicine

## 2014-02-16 VITALS — BP 128/80 | HR 65 | Ht 65.0 in | Wt 249.0 lb

## 2014-02-16 DIAGNOSIS — M1711 Unilateral primary osteoarthritis, right knee: Secondary | ICD-10-CM

## 2014-02-16 NOTE — Patient Instructions (Addendum)
Good to see you 1 month till you see my ugly mug.  Continue the ice, exercises and bracing.  Vitamin D 2000 IU daily Turmeric 524m daily.  Tylenol 5085mthree times daily.  Glucosamine 155034maily.  Happy halloween.

## 2014-02-16 NOTE — Assessment & Plan Note (Signed)
Patient did have third and final Synvisc injection. Patient tolerated the procedure well. We discussed continuing the icing protocol, conservative therapy, as well as bracing. Patient's x-ray show significant osteophytic changes of the knees bilaterally. Patient does not make any significant improvement after the series patient may need to consider surgical intervention. We'll discuss about followup in one month.

## 2014-02-16 NOTE — Progress Notes (Signed)
CC: Right knee followup  HPI: Patient is a very pleasant 63 year old obese female Coming in for followup of her right knee pain. 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 Patient's last injection into the right knee. Patient has had a total of 2 cortical steroid injections into his knee. Last one was about one month ago. Patient is continued on a conservative therapy including home as well as formal physical therapy, icing protocol, bracing, as well as topical anti-inflammatories. Patient has at this point failed all other conservative therapy. Patient states she continues to have significant amount of pain and is having more internal derangement type symptoms such as locking. Patient has unfortunately had to ambulate with the aid of a cane from time to time. Patient continues all her other therapies and has noticed some mild improvement when she does her physical therapy but due to financial constraints she finds it difficult to go on a regular basis. Patient still states that the pain wakes her up at night. Patient is still adamant that she would like to avoid surgical intervention. Patient is here for her third and final Synvisc injection. Patient states    Medications reviewed all in the electronic medical record.   Review of Systems: No headache, visual changes, nausea, vomiting, diarrhea, constipation, dizziness, abdominal pain, skin rash, fevers, chills, night sweats, weight loss, swollen lymph nodes, body aches, joint swelling, muscle aches, chest pain, shortness of breath, mood changes.   Objective:    Blood pressure 128/80, pulse 65, height 5' 5"  (1.651 m), weight 249 lb (112.946 kg).   General: No apparent distress alert and oriented x3 mood and affect normal, dressed appropriately.  HEENT: Pupils equal, extraocular movements intact Respiratory: Patient's speak in full sentences and does not appear short of breath Cardiovascular: No lower extremity  edema, non tender, no erythema Skin: Warm dry intact with no signs of infection or rash on extremities or on axial skeleton. Abdomen: Soft nontender Neuro: Cranial nerves II through XII are intact, neurovascularly intact in all extremities with 2+ DTRs and 2+ pulses. Lymph: No lymphadenopathy of posterior or anterior cervical chain or axillae bilaterally.  Gait valgus deformity  MSK: Non tender with full range of motion and good stability and symmetric strength and tone of shoulders, elbows, wrist, hip, and ankles bilaterally.  Knee: Right Difficult to assess secondary to patient's obesity Palpation normal with no warmth, patient does have severe medial joint line tenderness ROM full in flexion and extension and lower leg rotation. Ligaments with solid consistent endpoints including ACL, PCL, LCL, MCL. Positive Mcmurray's, Apley's, and Thessalonian tests. Painful patellar compression. Patellar glide with moderate crepitus. Patellar and quadriceps tendons unremarkable. Hamstring and quadriceps strength is normal.  Contralateral knee also has some medial and joint tenderness as well as osteoarthritic changes. Patient also has valgus deformity.  Procedure: Real-time Ultrasound Guided Injection of right knee Device: GE Logiq E  Ultrasound guided injection is preferred based studies that show increased duration, increased effect, greater accuracy, decreased procedural pain, increased response rate, and decreased cost with ultrasound guided versus blind injection.  Verbal informed consent obtained.  Time-out conducted.  Noted no overlying erythema, induration, or other signs of local infection.  Skin prepped in a sterile fashion.  Local anesthesia: Topical Ethyl chloride.  With sterile technique and under real time ultrasound guidance: With a 22-gauge 2 inch needle patient was injected 16 mg/2.5 mL of Synvisc (sodium hyaluronate) in a prefilled syringe was injected easily into the knee through a  22-gauge needle. This was from a superior lateral approach.  Completed without difficulty  Pain immediately resolved suggesting accurate placement of the medication.  Advised to call if fevers/chills, erythema, induration, drainage, or persistent bleeding.  Images permanently stored and available for review in the ultrasound unit.  Impression: Technically successful ultrasound guided injection.   Impression and Recommendations:     This case required medical decision making of moderate complexity.

## 2014-02-17 ENCOUNTER — Telehealth: Payer: Self-pay | Admitting: Family Medicine

## 2014-02-17 MED ORDER — DICLOFENAC SODIUM 2 % TD SOLN: TRANSDERMAL | Status: DC

## 2014-02-17 NOTE — Telephone Encounter (Signed)
Patient is inquiring on script for a cream.  She would like a call back.

## 2014-02-17 NOTE — Telephone Encounter (Signed)
Spoke to pt, re-sent pennsaid into pharmacy.

## 2014-02-20 ENCOUNTER — Other Ambulatory Visit: Payer: Self-pay | Admitting: Endocrinology

## 2014-02-23 ENCOUNTER — Other Ambulatory Visit: Payer: Self-pay | Admitting: Endocrinology

## 2014-02-24 ENCOUNTER — Other Ambulatory Visit: Payer: Self-pay | Admitting: Endocrinology

## 2014-03-13 ENCOUNTER — Other Ambulatory Visit: Payer: Self-pay | Admitting: Gastroenterology

## 2014-03-17 ENCOUNTER — Encounter: Payer: Self-pay | Admitting: Family Medicine

## 2014-03-17 ENCOUNTER — Encounter: Payer: Self-pay | Admitting: *Deleted

## 2014-03-17 ENCOUNTER — Ambulatory Visit (INDEPENDENT_AMBULATORY_CARE_PROVIDER_SITE_OTHER): Payer: 59 | Admitting: Family Medicine

## 2014-03-17 VITALS — BP 126/72 | HR 86 | Ht 65.0 in | Wt 253.0 lb

## 2014-03-17 DIAGNOSIS — M1711 Unilateral primary osteoarthritis, right knee: Secondary | ICD-10-CM

## 2014-03-17 MED ORDER — DICLOFENAC SODIUM 1 % TD GEL: 2.0000 g | Freq: Two times a day (BID) | TRANSDERMAL | Status: DC

## 2014-03-17 NOTE — Patient Instructions (Signed)
You are doing great Ice is still your friend Alternate the back and knee exercises daily.  Tylenol 312m-500mg 3 times daily no matter what Try the voltaren gel twice daily to knee and back.  Continue the gabapentin See me again in 3 weeks if back not better or when you need me.  Can repeat synvisvc in April if needed.

## 2014-03-17 NOTE — Progress Notes (Signed)
  CC: Right knee followup  HPI: Patient is a very pleasant 63 year old obese female Coming in for followup of her right knee pain. 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  Patient did have viscous supplementation one month ago. Patient states that she is approximately 85% better. Patient denies any numbness, any radiation of the pain. Patient does not have any significant improvement with the topical anti-inflammatories. Patient does not wear the brace anymore. Patient has not taken any pain medicine on a regular basis. Patient is happy with the results.    Medications reviewed all in the electronic medical record.   Review of Systems: No headache, visual changes, nausea, vomiting, diarrhea, constipation, dizziness, abdominal pain, skin rash, fevers, chills, night sweats, weight loss, swollen lymph nodes, body aches, joint swelling, muscle aches, chest pain, shortness of breath, mood changes.   Objective:    Blood pressure 126/72, pulse 86, height 5' 5"  (1.651 m), weight 253 lb (114.76 kg), SpO2 96 %.   General: No apparent distress alert and oriented x3 mood and affect normal, dressed appropriately.  HEENT: Pupils equal, extraocular movements intact Respiratory: Patient's speak in full sentences and does not appear short of breath Cardiovascular: No lower extremity edema, non tender, no erythema Skin: Warm dry intact with no signs of infection or rash on extremities or on axial skeleton. Abdomen: Soft nontender Neuro: Cranial nerves II through XII are intact, neurovascularly intact in all extremities with 2+ DTRs and 2+ pulses. Lymph: No lymphadenopathy of posterior or anterior cervical chain or axillae bilaterally.  Gait valgus deformity  MSK: Non tender with full range of motion and good stability and symmetric strength and tone of shoulders, elbows, wrist, hip, and ankles bilaterally.  Knee: Right Palpation normal with no warmth, patient does have   medial joint line tenderness but improved ROM full in flexion and extension and lower leg rotation. Ligaments with solid consistent endpoints including ACL, PCL, LCL, MCL. Positive Mcmurray's, Apley's, and Thessalonian tests. Painful patellar compression. Patellar glide with moderate crepitus. Patellar and quadriceps tendons unremarkable. Hamstring and quadriceps strength is normal.  Contralateral knee also has some medial and joint tenderness as well as osteoarthritic changes. Patient also has valgus deformity.    Impression and Recommendations:     This case required medical decision making of moderate complexity.

## 2014-03-17 NOTE — Assessment & Plan Note (Signed)
Patient is doing remarkably well one month after Synvisc injections. Encourage her to continue the exercises and the icing protocol. We discussed continuing to potentially watch patient's weight is also contribute. Patient will try these interventions and can follow-up with me on an as-needed basis.

## 2014-03-23 ENCOUNTER — Ambulatory Visit: Payer: 59 | Admitting: Endocrinology

## 2014-04-01 ENCOUNTER — Encounter: Payer: Self-pay | Admitting: Endocrinology

## 2014-04-01 ENCOUNTER — Ambulatory Visit (INDEPENDENT_AMBULATORY_CARE_PROVIDER_SITE_OTHER): Payer: 59 | Admitting: Endocrinology

## 2014-04-01 VITALS — BP 156/97 | HR 91 | Temp 98.3°F | Ht 65.0 in | Wt 254.0 lb

## 2014-04-01 DIAGNOSIS — M545 Low back pain: Secondary | ICD-10-CM

## 2014-04-01 DIAGNOSIS — E1143 Type 2 diabetes mellitus with diabetic autonomic (poly)neuropathy: Secondary | ICD-10-CM

## 2014-04-01 LAB — HEMOGLOBIN A1C: Hgb A1c MFr Bld: 8.6 % — ABNORMAL HIGH (ref 4.6–6.5)

## 2014-04-01 MED ORDER — KETOCONAZOLE 2 % EX CREA: 1.0000 "application " | TOPICAL_CREAM | Freq: Every day | CUTANEOUS | Status: DC

## 2014-04-01 NOTE — Progress Notes (Signed)
Subjective:    Patient ID: Jessica [name], female    DOB: 29-Jun-1950, 63 y.o.   MRN: [id number]  HPI  Pt returns for f/u of diabetes mellitus: DM type: Insulin-requiring type 2 Dx'ed: 4235 Complications: autonomic neuropathy Therapy: insulin GDM: never DKA: never Severe hypoglycemia: never Pancreatitis: never Other: Pt says her ability to care for her DM has been compromised by her husband's illness; she has done better on a simpler BID insulin regimen Interval history: no cbg record, but states cbg's are sometimes low at hs (she takes pm insulin after eating).  She says cbg's is highest in the afternoon (mid to high-100's).   Past Medical History  Diagnosis Date  . Asthma     childhood  . Hemorrhoids   . Autonomic neuropathy     diabetic  . Hypertension   . Gastroparesis   . Unspecified gastritis and gastroduodenitis without mention of hemorrhage   . Personal history of colonic polyps 03/2010    hyperplastic  . Chest pain     intermittant left sided chest pain, non-exertional, lasting 1-2 min  . Nasal congestion current    sore throat, ears-on antibiotic  . PONV (postoperative nausea and vomiting)   . Diabetes mellitus     type II, Hemoglobin A1C 9.9 10/05/2011  . GERD (gastroesophageal reflux disease)     Past Surgical History  Procedure Laterality Date  . Tubal ligation    . Appendectomy    . Left breast cyst removal  2000  . Dilatation & currettage/hysteroscopy with resectocope N/A 03/24/2013    Procedure: DILATATION & CURETTAGE, HYSTEROSCOPY WITH RESECTION;  Surgeon: Olga Millers, MD;  Location: [facility];  Service: Gynecology;  Laterality: N/A;    History   Social History  . Marital Status: Married    Spouse Name: Devar Munguia    Number of Children: 2  . Years of Education: N/A   Occupational History  . works for school system    Social History Main Topics  . Smoking status: Never Smoker   . Smokeless tobacco: Never Used  . Alcohol Use: No  . Drug  Use: No  . Sexual Activity: Not on file   Other Topics Concern  . Not on file   Social History Narrative    Current Outpatient Prescriptions on File Prior to Visit  Medication Sig Dispense Refill  . atorvastatin (LIPITOR) 40 MG tablet TAKE 1 TABLET BY MOUTH EVERY DAY 30 tablet 1  . B-D ULTRAFINE III SHORT PEN 31G X 8 MM MISC USE AS DIRECTED 4 TIMES DAILY. 200 each 1  . clidinium-chlordiazePOXIDE (LIBRAX) 5-2.5 MG per capsule Take 1 capsule by mouth 3 (three) times daily as needed. 100 capsule 6  . DEXILANT 60 MG capsule TAKE ONE CAPSULE BY MOUTH EVERY DAY 30 capsule 6  . diclofenac sodium (VOLTAREN) 1 % GEL Apply 2 g topically 2 (two) times daily. 100 g 3  . gabapentin (NEURONTIN) 300 MG capsule 1 pill 3 times daily 180 capsule 3  . glucose blood (ONE TOUCH ULTRA TEST) test strip Use as directed twice daily dx 250.00 100 each 3  . hyoscyamine (LEVSIN SL) 0.125 MG SL tablet Take 1 tablet (0.125 mg total) by mouth every 4 (four) hours as needed. 30 tablet 1  . Insulin Aspart Prot & Aspart (NOVOLOG MIX 70/30 FLEXPEN) (70-30) 100 UNIT/ML Pen 110 units with breakfast, and 80 units with the evening meal, and pen needles 2/day (Patient taking differently: 140 units with breakfast, and 80  units with the evening meal, and pen needles 2/day) 60 mL 11  . polyethylene glycol powder (GLYCOLAX/MIRALAX) powder USE 1 SCOOP AS DIRECTED 1-2 TIMES DAILY 527 g 2  . valACYclovir (VALTREX) 500 MG tablet TAKE 1 TABLET BY MOUTH EVERY DAY 60 tablet 3  . valsartan (DIOVAN) 320 MG tablet TAKE 1 TABLET BY MOUTH EVERY DAY 30 tablet 2  . Diclofenac Sodium 2 % SOLN Apply twice daily (Patient not taking: Reported on 04/01/2014) 112 g 3   No current facility-administered medications on file prior to visit.    Allergies  Allergen Reactions  . Nsaids Nausea And Vomiting    Cannot tolerate large doses     Family History  Problem Relation Age of Onset  . Colon cancer Sister   . Diabetes Sister   . Diabetes Brother     . Heart disease Father   . Other Father     2 collapsed lungs    BP 156/97 mmHg  Pulse 91  Temp(Src) 98.3 F (36.8 C) (Oral)  Ht 5' 5"  (1.651 m)  Wt 254 lb (115.214 kg)  BMI 42.27 kg/m2  SpO2 97%    Review of Systems Pt says her knee and back pain are limiting her exercise rx.  Denies LOC.     Objective:   Physical Exam VITAL SIGNS:  See vs page GENERAL: no distress Pulses: dorsalis pedis intact bilat.   Feet: no deformity.  no edema Skin:  no ulcer on the feet.  normal color and temp. Neuro: sensation is intact to touch on the feet.     Lab Results  Component Value Date   HGBA1C 8.6* 04/01/2014      Assessment & Plan:  DM: moderate exacerbation Noncompliance with cbg recording and insulin timing: I'll work around this as best I can Back pain, persistent  Patient is advised the following: Patient Instructions  check your blood sugar twice a day.  vary the time of day when you check, between before the 3 meals, and at bedtime.  also check if you have symptoms of your blood sugar being too high or too low.  please keep a record of the readings and bring it to your next appointment here.  You can write it on any piece of paper.  please call us sooner if your blood sugar goes below 70, or if you have a lot of readings over 200.   blood tests are being requested for you today.  We'll contact you with results.   Please see a back specialist.  you will receive a phone call, about a day and time for an appointment Please come back for a regular physical appointment in 3 months.   i have sent a prescription to your pharmacy to continue the skin cream.

## 2014-04-01 NOTE — Patient Instructions (Addendum)
check your blood sugar twice a day.  vary the time of day when you check, between before the 3 meals, and at bedtime.  also check if you have symptoms of your blood sugar being too high or too low.  please keep a record of the readings and bring it to your next appointment here.  You can write it on any piece of paper.  please call us sooner if your blood sugar goes below 70, or if you have a lot of readings over 200.   blood tests are being requested for you today.  We'll contact you with results.   Please see a back specialist.  you will receive a phone call, about a day and time for an appointment Please come back for a regular physical appointment in 3 months.   i have sent a prescription to your pharmacy to continue the skin cream.

## 2014-04-09 ENCOUNTER — Other Ambulatory Visit: Payer: Self-pay | Admitting: Endocrinology

## 2014-04-29 ENCOUNTER — Other Ambulatory Visit: Payer: Self-pay | Admitting: Gastroenterology

## 2014-05-06 ENCOUNTER — Other Ambulatory Visit: Payer: Self-pay | Admitting: Physician Assistant

## 2014-05-13 ENCOUNTER — Telehealth: Payer: Self-pay | Admitting: Internal Medicine

## 2014-05-13 NOTE — Telephone Encounter (Signed)
Yes until followup visit

## 2014-05-13 NOTE — Telephone Encounter (Signed)
Patient was last seen by Dr Sharlett Iles on 02/18/13. Patient has scheduled an office visit with you for 06/30/14. May I fill Dexilant until her office visit with you?

## 2014-05-14 MED ORDER — DEXLANSOPRAZOLE 60 MG PO CPDR: 1.0000 | DELAYED_RELEASE_CAPSULE | Freq: Every day | ORAL | Status: DC

## 2014-05-15 ENCOUNTER — Other Ambulatory Visit: Payer: Self-pay | Admitting: Endocrinology

## 2014-06-10 ENCOUNTER — Other Ambulatory Visit: Payer: Self-pay | Admitting: Gastroenterology

## 2014-06-10 ENCOUNTER — Other Ambulatory Visit: Payer: Self-pay | Admitting: Endocrinology

## 2014-06-18 ENCOUNTER — Encounter: Payer: Self-pay | Admitting: *Deleted

## 2014-06-29 ENCOUNTER — Other Ambulatory Visit: Payer: Self-pay | Admitting: Family Medicine

## 2014-06-29 NOTE — Telephone Encounter (Signed)
Refill done.  

## 2014-06-30 ENCOUNTER — Ambulatory Visit (INDEPENDENT_AMBULATORY_CARE_PROVIDER_SITE_OTHER): Payer: 59 | Admitting: Internal Medicine

## 2014-06-30 ENCOUNTER — Encounter: Payer: Self-pay | Admitting: Internal Medicine

## 2014-06-30 ENCOUNTER — Other Ambulatory Visit (INDEPENDENT_AMBULATORY_CARE_PROVIDER_SITE_OTHER): Payer: 59

## 2014-06-30 VITALS — BP 130/76 | HR 80 | Ht 65.0 in | Wt 255.4 lb

## 2014-06-30 DIAGNOSIS — Z800 Family history of malignant neoplasm of digestive organs: Secondary | ICD-10-CM

## 2014-06-30 DIAGNOSIS — Z86010 Personal history of colonic polyps: Secondary | ICD-10-CM

## 2014-06-30 DIAGNOSIS — K589 Irritable bowel syndrome without diarrhea: Secondary | ICD-10-CM

## 2014-06-30 DIAGNOSIS — K5900 Constipation, unspecified: Secondary | ICD-10-CM

## 2014-06-30 DIAGNOSIS — K900 Celiac disease: Secondary | ICD-10-CM

## 2014-06-30 DIAGNOSIS — K219 Gastro-esophageal reflux disease without esophagitis: Secondary | ICD-10-CM

## 2014-06-30 LAB — COMPREHENSIVE METABOLIC PANEL
ALT: 15 U/L (ref 0–35)
AST: 15 U/L (ref 0–37)
Albumin: 4.1 g/dL (ref 3.5–5.2)
Alkaline Phosphatase: 84 U/L (ref 39–117)
BUN: 20 mg/dL (ref 6–23)
CO2: 32 mEq/L (ref 19–32)
Calcium: 9.5 mg/dL (ref 8.4–10.5)
Chloride: 105 mEq/L (ref 96–112)
Creatinine, Ser: 1 mg/dL (ref 0.40–1.20)
GFR: 71.84 mL/min (ref >60.00)
Glucose, Bld: 75 mg/dL (ref 70–99)
Potassium: 3.8 mEq/L (ref 3.5–5.1)
Sodium: 138 mEq/L (ref 135–145)
Total Bilirubin: 0.3 mg/dL (ref 0.2–1.2)
Total Protein: 7.1 g/dL (ref 6.0–8.3)

## 2014-06-30 LAB — CBC WITH DIFFERENTIAL/PLATELET
Basophils Absolute: 0.1 10*3/uL (ref 0.0–0.1)
Basophils Relative: 1.5 % (ref 0.0–3.0)
Eosinophils Absolute: 0.1 10*3/uL (ref 0.0–0.7)
Eosinophils Relative: 2.1 % (ref 0.0–5.0)
HCT: 40.5 % (ref 36.0–46.0)
Hemoglobin: 13.7 g/dL (ref 12.0–15.0)
Lymphocytes Relative: 38.3 % (ref 12.0–46.0)
Lymphs Abs: 2.3 10*3/uL (ref 0.7–4.0)
MCHC: 33.9 g/dL (ref 30.0–36.0)
MCV: 94.1 fl (ref 78.0–100.0)
Monocytes Absolute: 0.3 10*3/uL (ref 0.1–1.0)
Monocytes Relative: 5.3 % (ref 3.0–12.0)
Neutro Abs: 3.2 10*3/uL (ref 1.4–7.7)
Neutrophils Relative %: 52.8 % (ref 43.0–77.0)
Platelets: 272 10*3/uL (ref 150.0–400.0)
RBC: 4.31 Mil/uL (ref 3.87–5.11)
RDW: 13.5 % (ref 11.5–15.5)
WBC: 6 10*3/uL (ref 4.0–10.5)

## 2014-06-30 MED ORDER — POLYETHYLENE GLYCOL 3350 17 GM/SCOOP PO POWD: ORAL | Status: DC

## 2014-06-30 MED ORDER — RANITIDINE HCL 150 MG PO TABS: 150.0000 mg | ORAL_TABLET | Freq: Every evening | ORAL | Status: DC | PRN

## 2014-06-30 MED ORDER — DEXLANSOPRAZOLE 60 MG PO CPDR: 1.0000 | DELAYED_RELEASE_CAPSULE | Freq: Every day | ORAL | Status: DC

## 2014-06-30 NOTE — Patient Instructions (Signed)
You have been scheduled for a colonoscopy. Please follow written instructions given to you at your visit today.  Please pick up your prep supplies at the pharmacy within the next 1-3 days. If you use inhalers (even only as needed), please bring them with you on the day of your procedure. Your physician has requested that you go to www.startemmi.com and enter the access code given to you at your visit today. This web site gives a general overview about your procedure. However, you should still follow specific instructions given to you by our office regarding your preparation for the procedure.  Please continue Librax as needed.  We have sent the following medications to your pharmacy for you to pick up at your convenience: Dexilant 60 mg daily Zantac 150 mg every night as needed  Please purchase the following medications over the counter and take as directed: Miralax 17 grams (1 capful) daily  Your physician has requested that you go to the basement for the following lab work before leaving today: CBC, TTG, CMP  CC:Dr Loanne Drilling

## 2014-06-30 NOTE — Telephone Encounter (Signed)
Refill done.  

## 2014-07-01 LAB — TISSUE TRANSGLUTAMINASE, IGA: Tissue Transglutaminase Ab, IgA: 1 U/mL (ref <4)

## 2014-07-01 NOTE — Progress Notes (Signed)
Subjective:    Patient ID: Jessica [name], female    DOB: Feb 22, 1951, 64 y.o.   MRN: [id number]  HPI Keyah Blizard is a 64 year old African American female with a past medical history of celiac disease, GERD, IBS, hyperplastic colon polyps and family history of colorectal cancer in her sister who is seen for follow-up. She was previously seen and followed by Dr. Verl Blalock prior to his retirement. This is her first visit together. She reports overall she has been doing well. She follows a strict gluten-free diet and if she does have gluten exposure she often feels "yucky" and if she were to eat bread she has nausea, vomiting and abdominal discomfort. She continues on Dexilant 60 mg daily which helps with her heartburn and reflux symptoms. Occasionally she will have breakthrough heartburn at night which is usually triggered by spicy foods. Bowel movements have been regular as long as she uses MiraLAX 17 g each evening. She denies blood in her stool or melena. No abdominal pain. No trouble swallowing or painful swallowing. No hepatobiliary complaint. No alcohol, NSAIDs or cigarettes.   Review of Systems As per history of present illness, otherwise negative  Current Medications, Allergies, Past Medical History, Past Surgical History, Family History and Social History were reviewed in [employer] record.     Objective:   Physical Exam BP 130/76 mmHg  Pulse 80  Ht 5' 5"  (1.651 m)  Wt 255 lb 6.4 oz (115.849 kg)  BMI 42.50 kg/m2 Constitutional: Well-developed and well-nourished. No distress. HEENT: Normocephalic and atraumatic. Oropharynx is clear and moist. No oropharyngeal exudate. Conjunctivae are normal.  No scleral icterus. Neck: Neck supple. Trachea midline. Cardiovascular: Normal rate, regular rhythm and intact distal pulses. No M/R/G Pulmonary/chest: Effort normal and breath sounds normal. No wheezing, rales or rhonchi. Abdominal: Soft, obese, nontender,  nondistended. Bowel sounds active throughout.  Extremities: no clubbing, cyanosis, or edema Lymphadenopathy: No cervical adenopathy noted. Neurological: Alert and oriented to person place and time. Skin: Skin is warm and dry. No rashes noted. Psychiatric: Normal mood and affect. Behavior is normal.  CBC    Component Value Date/Time   WBC 6.0 06/30/2014 1620   RBC 4.31 06/30/2014 1620   HGB 13.7 06/30/2014 1620   HCT 40.5 06/30/2014 1620   PLT 272.0 06/30/2014 1620   MCV 94.1 06/30/2014 1620   MCH 31.3 03/20/2013 0937   MCHC 33.9 06/30/2014 1620   RDW 13.5 06/30/2014 1620   LYMPHSABS 2.3 06/30/2014 1620   MONOABS 0.3 06/30/2014 1620   EOSABS 0.1 06/30/2014 1620   BASOSABS 0.1 06/30/2014 1620    CMP     Component Value Date/Time   NA 138 06/30/2014 1620   K 3.8 06/30/2014 1620   CL 105 06/30/2014 1620   CO2 32 06/30/2014 1620   GLUCOSE 75 06/30/2014 1620   BUN 20 06/30/2014 1620   CREATININE 1.00 06/30/2014 1620   CREATININE 0.83 04/04/2012 0832   CALCIUM 9.5 06/30/2014 1620   PROT 7.1 06/30/2014 1620   ALBUMIN 4.1 06/30/2014 1620   AST 15 06/30/2014 1620   ALT 15 06/30/2014 1620   ALKPHOS 84 06/30/2014 1620   BILITOT 0.3 06/30/2014 1620   GFRNONAA 71* 03/20/2013 0937   GFRAA 82* 03/20/2013 0937    Colonoscopy 03/07/2010 -- lipoma found scattered throughout the colon, sessile polyp at the hepatic flexure removed, multiple polyps in the rectosigmoid colon removed. Scattered diverticula in the sigmoid colon. Pathology result = hyperplastic polyp and polypoid colorectal mucosa with surface  ulceration.  EGD 02/27/2007 -- gastritis, retained food, mild diabetic gastroparesis. Probable occult peptic stricture dilated.    Assessment & Plan:   64 year old [place] American female with a past medical history of celiac disease, GERD, IBS, hyperplastic colon polyps and family history of colorectal cancer in her sister who is seen for follow-up.  1. Celiac disease -- diagnosed  by antibody and severe sensitivity to gluten all improving off gluten. We discussed how uncommon but not impossible celiac disease is in the African-American population. Either way it clearly seems she has celiac disease and will continue to follow gluten-free diet.  2. GERD -- Dexilant 60 mg continue this dose. I will add ranitidine 150 mg daily at bedtime when necessary for breakthrough heartburn. No alarm symptoms  3. IBS/constipation -- doing well with MiraLAX continue 17 g each day. She can use Librax on an as-needed basis which was previously prescribed by Dr. Sharlett Iles.  4. Family history of colon cancer -- I recommend a repeat colonoscopy at this time given her last exam was 5 years ago. We discussed the test including the risks and benefits and she is agreeable to proceed

## 2014-07-02 ENCOUNTER — Encounter: Payer: Self-pay | Admitting: Endocrinology

## 2014-07-02 ENCOUNTER — Ambulatory Visit (INDEPENDENT_AMBULATORY_CARE_PROVIDER_SITE_OTHER): Payer: 59 | Admitting: Endocrinology

## 2014-07-02 VITALS — BP 134/84 | HR 77 | Temp 98.2°F | Ht 65.0 in | Wt 254.0 lb

## 2014-07-02 DIAGNOSIS — M545 Low back pain: Secondary | ICD-10-CM

## 2014-07-02 DIAGNOSIS — E119 Type 2 diabetes mellitus without complications: Secondary | ICD-10-CM

## 2014-07-02 DIAGNOSIS — I10 Essential (primary) hypertension: Secondary | ICD-10-CM

## 2014-07-02 LAB — HEMOGLOBIN A1C: Hgb A1c MFr Bld: 8.1 % — ABNORMAL HIGH (ref 4.6–6.5)

## 2014-07-02 MED ORDER — INSULIN NPH ISOPHANE & REGULAR (70-30) 100 UNIT/ML ~~LOC~~ SUSP
SUBCUTANEOUS | Status: DC
Start: 2014-07-02 — End: 2015-07-08

## 2014-07-02 MED ORDER — BENZONATATE 100 MG PO CAPS: 100.0000 mg | ORAL_CAPSULE | Freq: Three times a day (TID) | ORAL | Status: DC | PRN

## 2014-07-02 NOTE — Progress Notes (Signed)
Subjective:    Patient ID: Jessica [name], female    DOB: January 01, 1951, 64 y.o.   MRN: [id number]  HPI Pt returns for f/u of diabetes mellitus: DM type: Insulin-requiring type 2 Dx'ed: 4098 Complications: autonomic neuropathy Therapy: insulin GDM: never DKA: never Severe hypoglycemia: never Pancreatitis: never Other: Pt says her ability to care for her DM has been compromised by her husband's illness; she has done better on a simpler BID insulin regimen Interval history: she brings a record of her cbg's which i have reviewed today.  It varies from 52-200.  It is lowest in the middle of the night.  She takes 110 units qam, and 80 units qpm.  Pt has 1 week of slight dry cough in the chest, and assoc nasal congestion.  No fever Past Medical History  Diagnosis Date  . Asthma     childhood  . Hemorrhoids   . Autonomic neuropathy     diabetic  . Hypertension   . Gastroparesis   . Unspecified gastritis and gastroduodenitis without mention of hemorrhage   . Personal history of colonic polyps 03/2010    hyperplastic  . Chest pain     intermittant left sided chest pain, non-exertional, lasting 1-2 min  . Nasal congestion current    sore throat, ears-on antibiotic  . PONV (postoperative nausea and vomiting)   . Diabetes mellitus     type II, Hemoglobin A1C 9.9 10/05/2011  . GERD (gastroesophageal reflux disease)   . Diverticulosis   . IBS (irritable bowel syndrome)   . Fatty liver   . Celiac disease     Past Surgical History  Procedure Laterality Date  . Tubal ligation    . Appendectomy    . Left breast cyst removal  2000  . Dilatation & currettage/hysteroscopy with resectocope N/A 03/24/2013    Procedure: DILATATION & CURETTAGE, HYSTEROSCOPY WITH RESECTION;  Surgeon: Olga Millers, MD;  Location: [facility];  Service: Gynecology;  Laterality: N/A;    History   Social History  . Marital Status: Married    Spouse Name: Devar Mcgruder  . Number of Children: 2  . Years of  Education: N/A   Occupational History  . works for school system    Social History Main Topics  . Smoking status: Never Smoker   . Smokeless tobacco: Never Used  . Alcohol Use: No  . Drug Use: No  . Sexual Activity: Not on file   Other Topics Concern  . Not on file   Social History Narrative    Current Outpatient Prescriptions on File Prior to Visit  Medication Sig Dispense Refill  . atorvastatin (LIPITOR) 40 MG tablet TAKE 1 TABLET BY MOUTH EVERY DAY 30 tablet 1  . B-D ULTRAFINE III SHORT PEN 31G X 8 MM MISC USE AS DIRECTED 4 TIMES DAILY. 200 each 1  . clidinium-chlordiazePOXIDE (LIBRAX) 5-2.5 MG per capsule Take 1 capsule by mouth 3 (three) times daily as needed. 100 capsule 6  . dexlansoprazole (DEXILANT) 60 MG capsule Take 1 capsule (60 mg total) by mouth daily. 30 capsule 3  . diclofenac sodium (VOLTAREN) 1 % GEL Apply 2 g topically 2 (two) times daily. 100 g 3  . Diclofenac Sodium 2 % SOLN Apply twice daily 112 g 3  . gabapentin (NEURONTIN) 300 MG capsule TAKE 1 CAPSULE BY MOUTH 3 TIMES DAILY 180 capsule 2  . gabapentin (NEURONTIN) 300 MG capsule TAKE 1 CAPSULE BY MOUTH 3 TIMES DAILY 270 capsule 1  . glucose blood (  ONE TOUCH ULTRA TEST) test strip Use as directed twice daily dx 250.00 100 each 3  . hyoscyamine (LEVSIN SL) 0.125 MG SL tablet PLACE 1 TABLET UNDER TONGUE EVERY 4 HOURS AS NEEDED 30 tablet 1  . ketoconazole (NIZORAL) 2 % cream Apply 1 application topically daily. 30 g 2  . polyethylene glycol powder (GLYCOLAX/MIRALAX) powder Take 1 capful (17 grams) dissolved in at least 8 ounces water/juice and drink every night. 527 g 3  . ranitidine (ZANTAC) 150 MG tablet Take 1 tablet (150 mg total) by mouth at bedtime as needed for heartburn. 30 tablet 2  . valACYclovir (VALTREX) 500 MG tablet TAKE 1 TABLET BY MOUTH EVERY DAY 60 tablet 3  . valsartan (DIOVAN) 320 MG tablet TAKE 1 TABLET BY MOUTH EVERY DAY 30 tablet 2   No current facility-administered medications on file  prior to visit.    Allergies  Allergen Reactions  . Nsaids Nausea And Vomiting    Cannot tolerate large doses     Family History  Problem Relation Age of Onset  . Colon cancer Sister   . Diabetes Sister   . Diabetes Brother   . Heart disease Father   . Other Father     2 collapsed lungs    BP 134/84 mmHg  Pulse 77  Temp(Src) 98.2 F (36.8 C) (Oral)  Ht 5' 5"  (1.651 m)  Wt 254 lb (115.214 kg)  BMI 42.27 kg/m2  SpO2 95%    Review of Systems  she denies LOC and weight change     Objective:   Physical Exam VITAL SIGNS:  See vs page GENERAL: no distress head: no deformity eyes: no periorbital swelling, no proptosis external nose and ears are normal mouth: no lesion seen Both eac's and tm's are normal LUNGS:  Clear to auscultation. Pulses: dorsalis pedis intact bilat.   MSK: no deformity of the feet CV: no leg edema Skin:  no ulcer on the feet.  normal color and temp on the feet. Neuro: sensation is intact to touch on the feet.     Lab Results  Component Value Date   HGBA1C 8.1* 07/02/2014      Assessment & Plan:  DM: moderate exacerbation. Cough, new to me, due to resolving URI Hypoglycemia: due to insulin, new.    Patient is advised the following: Patient Instructions  check your blood sugar twice a day.  vary the time of day when you check, between before the 3 meals, and at bedtime.  also check if you have symptoms of your blood sugar being too high or too low.  please keep a record of the readings and bring it to your next appointment here.  You can write it on any piece of paper.  please call us sooner if your blood sugar goes below 70, or if you have a lot of readings over 200.   blood tests are being requested for you today.  We'll contact you with results.   Please come back for a follow-up appointment in 3 months.  Please reduce the insulin to 110 units with breakfast, and 60 units with the evening meal. i have sent a prescription to your  pharmacy, for the cough.

## 2014-07-02 NOTE — Patient Instructions (Addendum)
check your blood sugar twice a day.  vary the time of day when you check, between before the 3 meals, and at bedtime.  also check if you have symptoms of your blood sugar being too high or too low.  please keep a record of the readings and bring it to your next appointment here.  You can write it on any piece of paper.  please call us sooner if your blood sugar goes below 70, or if you have a lot of readings over 200.   blood tests are being requested for you today.  We'll contact you with results.   Please come back for a follow-up appointment in 3 months.  Please reduce the insulin to 110 units with breakfast, and 60 units with the evening meal. i have sent a prescription to your pharmacy, for the cough.

## 2014-07-06 LAB — HM DIABETES EYE EXAM

## 2014-07-07 ENCOUNTER — Other Ambulatory Visit: Payer: Self-pay | Admitting: Internal Medicine

## 2014-07-25 ENCOUNTER — Other Ambulatory Visit: Payer: Self-pay | Admitting: Endocrinology

## 2014-08-19 ENCOUNTER — Encounter: Payer: Self-pay | Admitting: Endocrinology

## 2014-08-25 ENCOUNTER — Ambulatory Visit (AMBULATORY_SURGERY_CENTER): Payer: 59 | Admitting: Internal Medicine

## 2014-08-25 ENCOUNTER — Encounter: Payer: Self-pay | Admitting: Internal Medicine

## 2014-08-25 VITALS — BP 161/83 | HR 56 | Temp 97.5°F | Resp 19 | Ht 65.0 in | Wt 255.0 lb

## 2014-08-25 DIAGNOSIS — Z86010 Personal history of colonic polyps: Secondary | ICD-10-CM | POA: Diagnosis not present

## 2014-08-25 DIAGNOSIS — Z800 Family history of malignant neoplasm of digestive organs: Secondary | ICD-10-CM

## 2014-08-25 LAB — GLUCOSE, CAPILLARY
Glucose-Capillary: 159 mg/dL — ABNORMAL HIGH (ref 70–99)
Glucose-Capillary: 132 mg/dL — ABNORMAL HIGH (ref 70–99)

## 2014-08-25 MED ORDER — SODIUM CHLORIDE 0.9 % IV SOLN: 500.0000 mL | INTRAVENOUS | Status: DC

## 2014-08-25 NOTE — Progress Notes (Signed)
No problems noted in the recovery room. maw 

## 2014-08-25 NOTE — Patient Instructions (Signed)
YOU HAD AN ENDOSCOPIC PROCEDURE TODAY AT [facility]:   Refer to the procedure report that was given to you for any specific questions about what was found during the examination.  If the procedure report does not answer your questions, please call your gastroenterologist to clarify.  If you requested that your care partner not be given the details of your procedure findings, then the procedure report has been included in a sealed envelope for you to review at your convenience later.  YOU SHOULD EXPECT: Some feelings of bloating in the abdomen. Passage of more gas than usual.  Walking can help get rid of the air that was put into your GI tract during the procedure and reduce the bloating. If you had a lower endoscopy (such as a colonoscopy or flexible sigmoidoscopy) you may notice spotting of blood in your stool or on the toilet paper. If you underwent a bowel prep for your procedure, you may not have a normal bowel movement for a few days.  Please Note:  You might notice some irritation and congestion in your nose or some drainage.  This is from the oxygen used during your procedure.  There is no need for concern and it should clear up in a day or so.  SYMPTOMS TO REPORT IMMEDIATELY:   Following lower endoscopy (colonoscopy or flexible sigmoidoscopy):  Excessive amounts of blood in the stool  Significant tenderness or worsening of abdominal pains  Swelling of the abdomen that is new, acute  Fever of 100F or higher   For urgent or emergent issues, a gastroenterologist can be reached at any hour by calling [phone].   DIET: Your first meal following the procedure should be a small meal and then it is ok to progress to your normal diet. Heavy or fried foods are harder to digest and may make you feel nauseous or bloated.  Likewise, meals heavy in dairy and vegetables can increase bloating.  Drink plenty of fluids but you should avoid alcoholic beverages for 24  hours.  ACTIVITY:  You should plan to take it easy for the rest of today and you should NOT DRIVE or use heavy machinery until tomorrow (because of the sedation medicines used during the test).    FOLLOW UP: Our staff will call the number listed on your records the next business day following your procedure to check on you and address any questions or concerns that you may have regarding the information given to you following your procedure. If we do not reach you, we will leave a message.  However, if you are feeling well and you are not experiencing any problems, there is no need to return our call.  We will assume that you have returned to your regular daily activities without incident.  If any biopsies were taken you will be contacted by phone or by letter within the next 1-3 weeks.  Please call us at [phone] if you have not heard about the biopsies in 3 weeks.    SIGNATURES/CONFIDENTIALITY: You and/or your care partner have signed paperwork which will be entered into your electronic medical record.  These signatures attest to the fact that that the information above on your After Visit Summary has been reviewed and is understood.  Full responsibility of the confidentiality of this discharge information lies with you and/or your care-partner.    Handouts were given to your care partner on hemorrhoids, diverticulosis,and a high fiber diet with liberal fluid intake. You may resume your  current medications today. Your blood sugar was 132 in the recovery room. Please call if any questions or concerns.

## 2014-08-25 NOTE — Progress Notes (Signed)
Report to PACU, RN, vss, BBS= Clear.  

## 2014-08-25 NOTE — Op Note (Signed)
[place]  [address]. [place], [address]   COLONOSCOPY PROCEDURE REPORT  PATIENT: Jessica [name], Jessica [name]  MR#: [id number] BIRTHDATE: [birth year] , 63  yrs. old GENDER: female ENDOSCOPIST: Jerene Bears, MD PROCEDURE DATE:  08/25/2014 PROCEDURE:   Colonoscopy, surveillance First Screening Colonoscopy - Avg.  risk and is 50 yrs.  old or older - No.  Prior Negative Screening - Now for repeat screening. N/A  History of Adenoma - Now for follow-up colonoscopy & has been > or = to 3 yrs.  N/A ASA CLASS:   Class III INDICATIONS:Screening for colonic neoplasia, FH Colon or Rectal Adenocarcinoma, and FH Colon Adenoma. MEDICATIONS: Monitored anesthesia care and Propofol 240 mg IV  DESCRIPTION OF PROCEDURE:   After the risks benefits and alternatives of the procedure were thoroughly explained, informed consent was obtained.  The digital rectal exam revealed no rectal mass.   The LB [id number]  endoscope was introduced through the anus and advanced to the cecum, which was identified by both the appendix and ileocecal valve. No adverse events experienced. The quality of the prep was good.  (MoviPrep was used)  The instrument was then slowly withdrawn as the colon was fully examined.      COLON FINDINGS: There was mild diverticulosis noted in the ascending colon, descending colon, and sigmoid colon.   The examination was otherwise normal.  Retroflexed views revealed internal hemorrhoids. The time to cecum = 3.6 Withdrawal time = 11.2   The scope was withdrawn and the procedure completed.  COMPLICATIONS: There were no immediate complications.  ENDOSCOPIC IMPRESSION: 1.   Mild diverticulosis was noted in the ascending colon, descending colon, and sigmoid colon 2.   The examination was otherwise normal  RECOMMENDATIONS: 1.  High fiber diet 2.  Given your significant family history of colon cancer, you should have a repeat colonoscopy in 5 years  eSigned:  Jerene Bears, MD 08/25/2014 9:09 AM   cc: [name], MD and The Patient

## 2014-08-26 ENCOUNTER — Telehealth: Payer: Self-pay | Admitting: *Deleted

## 2014-08-26 NOTE — Telephone Encounter (Signed)
  Follow up Call-  Call back number 08/25/2014  Post procedure Call Back phone  # [phone]  Permission to leave phone message Yes     Patient questions:  Do you have a fever, pain , or abdominal swelling? No. Pain Score  0 *  Have you tolerated food without any problems? Yes.    Have you been able to return to your normal activities? Yes.    Do you have any questions about your discharge instructions: Diet   No. Medications  No. Follow up visit  No.  Do you have questions or concerns about your Care? No.  Actions: * If pain score is 4 or above: No action needed, pain <4.

## 2014-09-24 ENCOUNTER — Telehealth: Payer: Self-pay | Admitting: Endocrinology

## 2014-09-24 NOTE — Telephone Encounter (Signed)
Pt has an appt on 10/28/2014 and B/P will be checked then

## 2014-09-25 ENCOUNTER — Telehealth: Payer: Self-pay | Admitting: Endocrinology

## 2014-09-25 NOTE — Telephone Encounter (Signed)
Pt will have HgbA1C rechecked when they arrive at their appt on 10/28/2014

## 2014-10-06 ENCOUNTER — Encounter: Payer: 59 | Admitting: Podiatry

## 2014-10-06 ENCOUNTER — Other Ambulatory Visit: Payer: Self-pay | Admitting: Obstetrics and Gynecology

## 2014-10-07 ENCOUNTER — Other Ambulatory Visit: Payer: Self-pay | Admitting: Endocrinology

## 2014-10-07 LAB — CYTOLOGY - PAP

## 2014-10-08 NOTE — Progress Notes (Signed)
This encounter was created in error - please disregard.

## 2014-10-10 ENCOUNTER — Other Ambulatory Visit: Payer: Self-pay | Admitting: Endocrinology

## 2014-10-21 ENCOUNTER — Other Ambulatory Visit: Payer: Self-pay | Admitting: Endocrinology

## 2014-10-22 ENCOUNTER — Ambulatory Visit (INDEPENDENT_AMBULATORY_CARE_PROVIDER_SITE_OTHER): Payer: 59

## 2014-10-22 ENCOUNTER — Ambulatory Visit (INDEPENDENT_AMBULATORY_CARE_PROVIDER_SITE_OTHER): Payer: 59 | Admitting: Podiatry

## 2014-10-22 ENCOUNTER — Encounter: Payer: Self-pay | Admitting: Podiatry

## 2014-10-22 ENCOUNTER — Ambulatory Visit: Payer: 59

## 2014-10-22 VITALS — BP 146/68 | HR 77 | Resp 14 | Ht 65.0 in | Wt 256.0 lb

## 2014-10-22 DIAGNOSIS — M79672 Pain in left foot: Secondary | ICD-10-CM | POA: Diagnosis not present

## 2014-10-22 DIAGNOSIS — M6740 Ganglion, unspecified site: Secondary | ICD-10-CM | POA: Diagnosis not present

## 2014-10-22 DIAGNOSIS — M2010 Hallux valgus (acquired), unspecified foot: Secondary | ICD-10-CM

## 2014-10-22 DIAGNOSIS — M79673 Pain in unspecified foot: Secondary | ICD-10-CM

## 2014-10-22 DIAGNOSIS — E119 Type 2 diabetes mellitus without complications: Secondary | ICD-10-CM

## 2014-10-22 DIAGNOSIS — M722 Plantar fascial fibromatosis: Secondary | ICD-10-CM

## 2014-10-22 NOTE — Progress Notes (Signed)
She presents today with a chief complaint of a painful nodule to the dorsolateral aspect of the left foot. I discussed conservative out the side of what it is or why there. She also complains that she has bunions and possible corns to the bilateral fifth toes. She denies any changes in her past medical history medications allergies surgeries or social history.  Objective: Vital signs are stable alert and oriented 3. Pulses are strongly palpable bilateral. Neurologic sensorium is intact per Semmes-Weinstein monofilament. The tendon reflexes are intact bilaterally muscle strength was 5 over 5 wrist flexors plantar flexors and inverters and everters all to the musculature is intact. Orthopedic evaluation demonstrates pes planus bilaterally with mild hours abductovalgus deformities bilateral and adductor varus rotated hammertoe deformities with no overlying reactive hyperkeratosis. There is not small nonpulsatile nodule overlying the fourth fifth met cuboid articulation which appears to be a ganglion. Radiographs confirm no major osseous abnormalities other than bunion deformities.  Assessment: Soft tissue nodule consistent with a ganglion dorsolateral aspect of the left foot history diabetes mellitus without complications possible abductovalgus deformities bilateral. Hammertoe deformities fifth bilateral non-complicated.  Plan: We discussed the etiology pathology conservative versus surgical therapies today we discussed appropriate shoe gear stretching exercises ice therapy she modifications. I will follow-up with her for surgical consult when she is ready.

## 2014-10-28 ENCOUNTER — Encounter: Payer: 59 | Admitting: Endocrinology

## 2014-11-02 ENCOUNTER — Other Ambulatory Visit: Payer: Self-pay | Admitting: Internal Medicine

## 2014-11-03 ENCOUNTER — Telehealth: Payer: Self-pay | Admitting: Internal Medicine

## 2014-11-03 ENCOUNTER — Encounter: Payer: Self-pay | Admitting: Gastroenterology

## 2014-11-03 NOTE — Telephone Encounter (Signed)
Error

## 2014-11-04 MED ORDER — POLYETHYLENE GLYCOL 3350 17 GM/SCOOP PO POWD: ORAL | Status: DC

## 2014-11-04 NOTE — Telephone Encounter (Signed)
Pt uses miralax daily for constipation.  Refills sent

## 2014-11-06 ENCOUNTER — Encounter: Payer: Self-pay | Admitting: Gastroenterology

## 2014-11-06 ENCOUNTER — Other Ambulatory Visit: Payer: Self-pay | Admitting: Endocrinology

## 2014-11-11 ENCOUNTER — Other Ambulatory Visit: Payer: Self-pay | Admitting: Internal Medicine

## 2014-11-18 ENCOUNTER — Other Ambulatory Visit: Payer: Self-pay | Admitting: Internal Medicine

## 2014-12-08 ENCOUNTER — Other Ambulatory Visit: Payer: Self-pay | Admitting: Endocrinology

## 2014-12-16 ENCOUNTER — Telehealth: Payer: Self-pay | Admitting: Internal Medicine

## 2014-12-16 NOTE — Telephone Encounter (Signed)
I have placed a coupon in the mail for patient per her request.

## 2014-12-17 ENCOUNTER — Encounter: Payer: Self-pay | Admitting: Podiatry

## 2014-12-17 ENCOUNTER — Ambulatory Visit (INDEPENDENT_AMBULATORY_CARE_PROVIDER_SITE_OTHER): Payer: 59 | Admitting: Podiatry

## 2014-12-17 VITALS — BP 160/75 | HR 74 | Resp 16

## 2014-12-17 DIAGNOSIS — L600 Ingrowing nail: Secondary | ICD-10-CM

## 2014-12-17 MED ORDER — NEOMYCIN-POLYMYXIN-HC 1 % OT SOLN: OTIC | Status: DC

## 2014-12-17 NOTE — Progress Notes (Signed)
She presents today with a chief complaint of ingrown toenail tibial border of the hallux right. States his been present for several weeks and nothing that she has done it seems to alleviate symptoms. She's tried cutting the edge off but that didn't help.   Objective: Vital signs are stable she is alert and oriented 3. Pulses are strongly palpable. Neurologic sensorium is intact. Degenerative flexors are intact. Muscle strength is 5 over 5 dorsiflexion plantarflexion and inversion everters on his musculatures intact. Orthopedic evaluation does demonstrate mild hallux valgus deformity right foot. Mild hammertoe deformities of the lesser digits. Cutaneous evaluationsharp incurvated nail margins with erythema and edema no purulence or no malodor to the hallux right.  Assessment: Ingrown nail paronychia abscess hallux right.  Plan: Chemical matrixectomy was performed today after local anesthesia was administered. She tolerated this procedure well without complications. She was provided with both oral and written home going instructions for care and soaking of his her toe as well as a prescription for Cortisporin Otic which she will purchase at the pharmacy. I will follow up with her in one week. She would like to have her nails debrided at that time.

## 2014-12-17 NOTE — Patient Instructions (Signed)

## 2014-12-21 ENCOUNTER — Telehealth: Payer: Self-pay | Admitting: Internal Medicine

## 2014-12-21 MED ORDER — ESOMEPRAZOLE MAGNESIUM 40 MG PO CPDR: 40.0000 mg | DELAYED_RELEASE_CAPSULE | Freq: Every day | ORAL | Status: DC

## 2014-12-21 NOTE — Telephone Encounter (Signed)
Nexium rx sent in place of [facility] for patient.

## 2014-12-21 NOTE — Telephone Encounter (Signed)
Advised patient to call her insurance company to determine which PPI's they will cover. She will do this and call me back.

## 2014-12-22 ENCOUNTER — Ambulatory Visit: Payer: 59 | Admitting: Podiatry

## 2014-12-24 ENCOUNTER — Ambulatory Visit (INDEPENDENT_AMBULATORY_CARE_PROVIDER_SITE_OTHER): Payer: 59 | Admitting: Podiatry

## 2014-12-24 ENCOUNTER — Encounter: Payer: Self-pay | Admitting: Podiatry

## 2014-12-24 VITALS — BP 147/75 | HR 78 | Resp 16

## 2014-12-24 DIAGNOSIS — M79676 Pain in unspecified toe(s): Secondary | ICD-10-CM | POA: Diagnosis not present

## 2014-12-24 DIAGNOSIS — L600 Ingrowing nail: Secondary | ICD-10-CM

## 2014-12-24 DIAGNOSIS — B351 Tinea unguium: Secondary | ICD-10-CM

## 2014-12-24 NOTE — Patient Instructions (Signed)

## 2014-12-25 NOTE — Progress Notes (Signed)
She presents today 1 week status post matrixectomy hallux right. States that she continues to soak in Betadine warm water. She continues Corticosporin otic as directed. She states that her toenails are thick and yellow and would like Ross to trim them if at all possible.  Objective: vital signs are stable she is alert and oriented 3 matrixectomy site is going to heal quite nicely no erythema edema cellulitis drainage or odor. Nail plates are thick yellow dystrophic onychomycotic and painful palpation.  Assessment: well-healing surgical toe hallux right. Pain limb secondary to onychomycosis.  Plan: discussed etiology pathology conservative versus surgical therapies discontinue Betadine start with Epson salt warm water soaks covered in the day and leave open at night. Debrided nails 1 through 5 bilateral covered service secondary to pain.

## 2014-12-30 ENCOUNTER — Telehealth: Payer: Self-pay

## 2014-12-30 NOTE — Telephone Encounter (Signed)
Pt number on file is not working

## 2015-02-03 ENCOUNTER — Other Ambulatory Visit: Payer: Self-pay | Admitting: Endocrinology

## 2015-02-28 ENCOUNTER — Other Ambulatory Visit: Payer: Self-pay | Admitting: Internal Medicine

## 2015-03-31 ENCOUNTER — Other Ambulatory Visit: Payer: Self-pay | Admitting: Endocrinology

## 2015-04-14 ENCOUNTER — Ambulatory Visit (INDEPENDENT_AMBULATORY_CARE_PROVIDER_SITE_OTHER): Payer: 59 | Admitting: Endocrinology

## 2015-04-14 ENCOUNTER — Encounter: Payer: Self-pay | Admitting: Endocrinology

## 2015-04-14 VITALS — BP 144/93 | HR 80 | Temp 98.3°F | Wt 260.0 lb

## 2015-04-14 DIAGNOSIS — E1143 Type 2 diabetes mellitus with diabetic autonomic (poly)neuropathy: Secondary | ICD-10-CM | POA: Diagnosis not present

## 2015-04-14 LAB — POCT GLYCOSYLATED HEMOGLOBIN (HGB A1C): Hemoglobin A1C: 7.3

## 2015-04-14 MED ORDER — PROMETHAZINE-CODEINE 6.25-10 MG/5ML PO SYRP: 5.0000 mL | ORAL_SOLUTION | ORAL | Status: DC | PRN

## 2015-04-14 MED ORDER — AZITHROMYCIN 500 MG PO TABS: 500.0000 mg | ORAL_TABLET | Freq: Every day | ORAL | Status: DC

## 2015-04-14 MED ORDER — FLUTICASONE-SALMETEROL 100-50 MCG/DOSE IN AEPB: 1.0000 | INHALATION_SPRAY | Freq: Two times a day (BID) | RESPIRATORY_TRACT | Status: DC

## 2015-04-14 NOTE — Patient Instructions (Addendum)
i have sent 2 prescriptions to your pharmacy: antibiotic and inhaler Loratadine-d (non-prescription) will help your congestion. Please continue the same insulin. On this type of insulin schedule, you should eat meals on a regular schedule.  If a meal is missed or significantly delayed, your blood sugar could go low. Please come back for a regular physical appointment in 3 months.

## 2015-04-14 NOTE — Progress Notes (Signed)
Subjective:    Patient ID: Jessica [name], female    DOB: 07-17-1950, 64 y.o.   MRN: [id number]  HPI  Pt returns for f/u of diabetes mellitus: DM type: Insulin-requiring type 2 Dx'ed: 9038 Complications: autonomic neuropathy Therapy: insulin GDM: never DKA: never Severe hypoglycemia: never Pancreatitis: never Other: Pt says her ability to care for her DM has been compromised by her husband's illness; she has done better on a simpler BID insulin regimen Interval history: She seldom has hypoglycemia, and these episodes are mild.  It happens after a meal is missed. Pt states 5 days of prod-quality cough in the chest, and assoc wheezing.   Past Medical History  Diagnosis Date  . Asthma     childhood  . Hemorrhoids   . Autonomic neuropathy     diabetic  . Hypertension   . Gastroparesis   . Unspecified gastritis and gastroduodenitis without mention of hemorrhage   . Personal history of colonic polyps 03/2010    hyperplastic  . Chest pain     intermittant left sided chest pain, non-exertional, lasting 1-2 min  . Nasal congestion current    sore throat, ears-on antibiotic  . PONV (postoperative nausea and vomiting)   . Diabetes mellitus     type II, Hemoglobin A1C 9.9 10/05/2011  . GERD (gastroesophageal reflux disease)   . Diverticulosis   . IBS (irritable bowel syndrome)   . Fatty liver   . Celiac disease   . Arthritis   . Cataract     Past Surgical History  Procedure Laterality Date  . Tubal ligation    . Appendectomy    . Left breast cyst removal  2000  . Dilatation & currettage/hysteroscopy with resectocope N/A 03/24/2013    Procedure: DILATATION & CURETTAGE, HYSTEROSCOPY WITH RESECTION;  Surgeon: Olga Millers, MD;  Location: [facility];  Service: Gynecology;  Laterality: N/A;    Social History   Social History  . Marital Status: Married    Spouse Name: Devar Ronda  . Number of Children: 2  . Years of Education: N/A   Occupational History  . works for  school system    Social History Main Topics  . Smoking status: Never Smoker   . Smokeless tobacco: Never Used  . Alcohol Use: No  . Drug Use: No  . Sexual Activity: Not on file   Other Topics Concern  . Not on file   Social History Narrative    Current Outpatient Prescriptions on File Prior to Visit  Medication Sig Dispense Refill  . atorvastatin (LIPITOR) 40 MG tablet TAKE 1 TABLET BY MOUTH EVERY DAY 30 tablet 1  . B-D ULTRAFINE III SHORT PEN 31G X 8 MM MISC USE AS DIRECTED 4 TIMES DAILY. 200 each 1  . clidinium-chlordiazePOXIDE (LIBRAX) 5-2.5 MG per capsule Take 1 capsule by mouth 3 (three) times daily as needed. 100 capsule 6  . esomeprazole (NEXIUM) 40 MG capsule Take 1 capsule (40 mg total) by mouth daily. 30 capsule 3  . gabapentin (NEURONTIN) 300 MG capsule TAKE 1 CAPSULE BY MOUTH 3 TIMES DAILY 180 capsule 2  . hyoscyamine (LEVSIN SL) 0.125 MG SL tablet PLACE 1 TABLET UNDER TONGUE EVERY 4 HOURS AS NEEDED 30 tablet 1  . insulin NPH-regular Human (HUMULIN 70/30) (70-30) 100 UNIT/ML injection 110 units with breakfast, and 60 units with the evening meal, and syringes 2/day 60 mL 11  . ONE TOUCH ULTRA TEST test strip USE AS DIRECTED TWICE DAILY DX 250.00 100 each 2  .  polyethylene glycol powder (GLYCOLAX/MIRALAX) powder TAKE 1 CAPFUL (17 GRAMS) DISSOLVED IN AT LEAST 8 OUNCES WATER/JUICE AND DRINK EVERY NIGHT. 527 g 3  . ranitidine (ZANTAC) 150 MG tablet TAKE 1 TABLET (150 MG TOTAL) BY MOUTH AT BEDTIME AS NEEDED FOR HEARTBURN. 30 tablet 2  . valACYclovir (VALTREX) 500 MG tablet TAKE 1 TABLET BY MOUTH EVERY DAY 60 tablet 3  . valsartan (DIOVAN) 320 MG tablet TAKE 1 TABLET BY MOUTH EVERY DAY 30 tablet 2   No current facility-administered medications on file prior to visit.    Allergies  Allergen Reactions  . Nsaids Nausea And Vomiting    Cannot tolerate large doses     Family History  Problem Relation Age of Onset  . Colon cancer Sister   . Diabetes Sister   . Diabetes  Brother   . Heart disease Father   . Other Father     2 collapsed lungs    BP 144/93 mmHg  Pulse 80  Temp(Src) 98.3 F (36.8 C) (Oral)  Wt 260 lb (117.935 kg)  SpO2 98%  Review of Systems She has sore throat, left earache, nasal congestion, and headache.      Objective:   Physical Exam VITAL SIGNS:  See vs page GENERAL: no distress.   head: no deformity.  eyes: no periorbital swelling, no proptosis.   external nose and ears are normal mouth: no lesion seen. Both eac's and tm's are normal.  LUNGS:  Clear to auscultation.   A1c=7.3%    Assessment & Plan:  URI: new DM: this is the best control this pt should aim for, given this regimen, which does match insulin to her changing needs throughout the day.    Patient is advised the following: Patient Instructions  i have sent 2 prescriptions to your pharmacy: antibiotic and inhaler Loratadine-d (non-prescription) will help your congestion. Please continue the same insulin. On this type of insulin schedule, you should eat meals on a regular schedule.  If a meal is missed or significantly delayed, your blood sugar could go low. Please come back for a regular physical appointment in 3 months.

## 2015-04-15 ENCOUNTER — Telehealth: Payer: Self-pay | Admitting: Endocrinology

## 2015-04-15 NOTE — Telephone Encounter (Signed)
Patient called stating that she went to pick up her Rx it was too expensive   Rx: Advair  Is there a different inhaler she can use   Please advise    Thank you

## 2015-04-16 NOTE — Telephone Encounter (Signed)
All of the inhalers are expensive, as there is no alternative.

## 2015-04-16 NOTE — Telephone Encounter (Signed)
Pt advised of note below and voiced understanding.

## 2015-04-16 NOTE — Telephone Encounter (Signed)
See note below and please advise, Thanks! 

## 2015-04-20 ENCOUNTER — Other Ambulatory Visit: Payer: Self-pay | Admitting: Endocrinology

## 2015-05-06 ENCOUNTER — Other Ambulatory Visit: Payer: Self-pay | Admitting: Family Medicine

## 2015-05-06 NOTE — Telephone Encounter (Signed)
Spoke with pt to confirm that she did need a refill for gabapentin. Pt stated that she did. I advised her that I would refill it this time but she would need to make an appt to see dr Tamala Julian to receive future refills. Pt understood.

## 2015-06-01 ENCOUNTER — Other Ambulatory Visit: Payer: Self-pay | Admitting: Endocrinology

## 2015-06-13 ENCOUNTER — Other Ambulatory Visit: Payer: Self-pay | Admitting: Internal Medicine

## 2015-06-21 ENCOUNTER — Other Ambulatory Visit: Payer: Self-pay | Admitting: Internal Medicine

## 2015-06-23 ENCOUNTER — Other Ambulatory Visit: Payer: Self-pay | Admitting: Endocrinology

## 2015-06-27 ENCOUNTER — Other Ambulatory Visit: Payer: Self-pay | Admitting: Internal Medicine

## 2015-07-02 ENCOUNTER — Telehealth: Payer: Self-pay | Admitting: Internal Medicine

## 2015-07-02 MED ORDER — DEXLANSOPRAZOLE 60 MG PO CPDR: 60.0000 mg | DELAYED_RELEASE_CAPSULE | Freq: Every day | ORAL | Status: DC

## 2015-07-02 NOTE — Telephone Encounter (Signed)
Rx sent. Patient needs office visit for further refills.

## 2015-07-04 ENCOUNTER — Other Ambulatory Visit: Payer: Self-pay | Admitting: Endocrinology

## 2015-07-05 NOTE — Telephone Encounter (Signed)
Please advise if ok to refill. Medication is not on current med list.

## 2015-07-08 ENCOUNTER — Other Ambulatory Visit: Payer: Self-pay | Admitting: Endocrinology

## 2015-07-08 NOTE — Telephone Encounter (Signed)
Pt called and needs her refill of insulin ASAP she is totally out

## 2015-07-13 ENCOUNTER — Ambulatory Visit: Payer: 59 | Admitting: Endocrinology

## 2015-07-19 ENCOUNTER — Other Ambulatory Visit: Payer: Self-pay | Admitting: Endocrinology

## 2015-07-25 NOTE — Progress Notes (Signed)
Subjective:    Patient ID: Jessica [name], female    DOB: [birth year], 65 y.o.   MRN: [id number]  HPI Pt returns for f/u of diabetes mellitus: DM type: Insulin-requiring type 2 Dx'ed: 2641 Complications: autonomic neuropathy Therapy: insulin GDM: never DKA: never Severe hypoglycemia: never. Pancreatitis: never Other: Pt says her ability to care for her DM continues to be compromised by her husband's illness; she has done better on a simpler BID insulin regimen; she takes human insulin, due to cost.   Interval history: She has mild hypoglycemia, and these episodes are mild.  It happens after a meal is missed.  Past Medical History  Diagnosis Date  . Asthma     childhood  . Hemorrhoids   . Autonomic neuropathy     diabetic  . Hypertension   . Gastroparesis   . Unspecified gastritis and gastroduodenitis without mention of hemorrhage   . Personal history of colonic polyps 03/2010    hyperplastic  . Chest pain     intermittant left sided chest pain, non-exertional, lasting 1-2 min  . Nasal congestion current    sore throat, ears-on antibiotic  . PONV (postoperative nausea and vomiting)   . Diabetes mellitus     type II, Hemoglobin A1C 9.9 10/05/2011  . GERD (gastroesophageal reflux disease)   . Diverticulosis   . IBS (irritable bowel syndrome)   . Fatty liver   . Celiac disease   . Arthritis   . Cataract     Past Surgical History  Procedure Laterality Date  . Tubal ligation    . Appendectomy    . Left breast cyst removal  2000  . Dilatation & currettage/hysteroscopy with resectocope N/A 03/24/2013    Procedure: DILATATION & CURETTAGE, HYSTEROSCOPY WITH RESECTION;  Surgeon: Olga Millers, MD;  Location: [facility];  Service: Gynecology;  Laterality: N/A;    Social History   Social History  . Marital Status: Married    Spouse Name: Devar Chalupa  . Number of Children: 2  . Years of Education: N/A   Occupational History  . works for school system    Social History  Main Topics  . Smoking status: Never Smoker   . Smokeless tobacco: Never Used  . Alcohol Use: No  . Drug Use: No  . Sexual Activity: Not on file   Other Topics Concern  . Not on file   Social History Narrative    Current Outpatient Prescriptions on File Prior to Visit  Medication Sig Dispense Refill  . atorvastatin (LIPITOR) 40 MG tablet TAKE 1 TABLET BY MOUTH EVERY DAY 30 tablet 1  . B-D INS SYR ULTRAFINE 1CC/31G 31G X 5/16" 1 ML MISC USES UP TO 3 A DAY 100 each 2  . clidinium-chlordiazePOXIDE (LIBRAX) 5-2.5 MG per capsule Take 1 capsule by mouth 3 (three) times daily as needed. 100 capsule 6  . dexlansoprazole (DEXILANT) 60 MG capsule Take 1 capsule (60 mg total) by mouth daily. 30 capsule 0  . Fluticasone-Salmeterol (ADVAIR) 100-50 MCG/DOSE AEPB Inhale 1 puff into the lungs 2 (two) times daily. 1 each 3  . gabapentin (NEURONTIN) 300 MG capsule TAKE 1 CAPSULE BY MOUTH 3 TIMES DAILY 180 capsule 2  . gabapentin (NEURONTIN) 300 MG capsule TAKE 1 CAPSULE BY MOUTH 3 TIMES DAILY 270 capsule 0  . HUMULIN 70/30 (70-30) 100 UNIT/ML injection INJECT 110 UNITS WITH BREAKFAST, AND 60 UNITS WITH THE EVENING MEAL 60 mL 5  . hyoscyamine (LEVSIN SL) 0.125 MG SL tablet PLACE 1  TABLET UNDER TONGUE EVERY 4 HOURS AS NEEDED 30 tablet 1  . ketoconazole (NIZORAL) 2 % cream APPLY TO AFFECTED AREA EVERY DAY 30 g 2  . ONE TOUCH ULTRA TEST test strip USE AS DIRECTED TWICE DAILY DX 250.00 100 each 2  . polyethylene glycol powder (GLYCOLAX/MIRALAX) powder TAKE 1 CAPFUL (17 GRAMS) DISSOLVED IN AT LEAST 8 OUNCES WATER/JUICE AND DRINK EVERY NIGHT. 527 g 3  . valACYclovir (VALTREX) 500 MG tablet TAKE 1 TABLET BY MOUTH EVERY DAY 60 tablet 3  . valsartan (DIOVAN) 320 MG tablet TAKE 1 TABLET BY MOUTH EVERY DAY 30 tablet 2  . promethazine-codeine (PHENERGAN WITH CODEINE) 6.25-10 MG/5ML syrup Take 5 mLs by mouth every 4 (four) hours as needed. 240 mL 1  . ranitidine (ZANTAC) 150 MG tablet TAKE 1 TABLET (150 MG TOTAL) BY  MOUTH AT BEDTIME AS NEEDED FOR HEARTBURN. 30 tablet 2   No current facility-administered medications on file prior to visit.    Allergies  Allergen Reactions  . Nsaids Nausea And Vomiting    Cannot tolerate large doses     Family History  Problem Relation Age of Onset  . Colon cancer Sister   . Diabetes Sister   . Diabetes Brother   . Heart disease Father   . Other Father     2 collapsed lungs    BP 136/82 mmHg  Pulse 78  Temp(Src) 98.8 F (37.1 C) (Oral)  Ht 5' 5"  (1.651 m)  Wt 255 lb (115.667 kg)  BMI 42.43 kg/m2  SpO2 95%  Review of Systems Denies LOC.  She has intermittent numbness of the RUE (she feels due to a shoulder injury).  She has intermittent nasal congestion.     Objective:   Physical Exam VITAL SIGNS:  See vs page.  GENERAL: no distress. head: no deformity eyes: no periorbital swelling, no proptosis external nose and ears are normal mouth: no lesion seen Both eac's and tm's are normal. Pulses: dorsalis pedis intact bilat.   MSK: no deformity of the feet.  CV: no leg edema.  Skin:  no ulcer on the feet.  normal color and temp on the feet. Neuro: sensation is intact to touch on the feet.      A1c=7.1%    Assessment & Plan:  Allergic rhinitis, recurrent RUE numbness, new.  Pt feels this is due to a problem with her right shuolder. DM: well-controlled.  rx is limited by caregiver status  Patient is advised the following: Patient Instructions  Please see an allergy specialist.  you will receive a phone call, about a day and time for an appointment Loratadine-d (non-prescription) will help your congestion.  Please ask Dr Tamala Julian about your shoulder.  Please continue the same insulin.  On this type of insulin schedule, you should eat meals on a regular schedule.  If a meal is missed or significantly delayed, your blood sugar could go low. Please come back for a regular physical appointment in 3 months.

## 2015-07-26 ENCOUNTER — Ambulatory Visit (INDEPENDENT_AMBULATORY_CARE_PROVIDER_SITE_OTHER): Payer: 59 | Admitting: Endocrinology

## 2015-07-26 ENCOUNTER — Encounter: Payer: Self-pay | Admitting: Endocrinology

## 2015-07-26 VITALS — BP 136/82 | HR 78 | Temp 98.8°F | Ht 65.0 in | Wt 255.0 lb

## 2015-07-26 DIAGNOSIS — E1143 Type 2 diabetes mellitus with diabetic autonomic (poly)neuropathy: Secondary | ICD-10-CM

## 2015-07-26 DIAGNOSIS — Z794 Long term (current) use of insulin: Secondary | ICD-10-CM | POA: Diagnosis not present

## 2015-07-26 LAB — POCT GLYCOSYLATED HEMOGLOBIN (HGB A1C): Hemoglobin A1C: 7.1

## 2015-07-26 NOTE — Patient Instructions (Addendum)
Please see an allergy specialist.  you will receive a phone call, about a day and time for an appointment Loratadine-d (non-prescription) will help your congestion.  Please ask Dr Tamala Julian about your shoulder.  Please continue the same insulin.  On this type of insulin schedule, you should eat meals on a regular schedule.  If a meal is missed or significantly delayed, your blood sugar could go low. Please come back for a regular physical appointment in 3 months.

## 2015-07-27 ENCOUNTER — Encounter: Payer: Self-pay | Admitting: Family Medicine

## 2015-07-27 ENCOUNTER — Ambulatory Visit (INDEPENDENT_AMBULATORY_CARE_PROVIDER_SITE_OTHER): Payer: 59 | Admitting: Family Medicine

## 2015-07-27 ENCOUNTER — Ambulatory Visit (INDEPENDENT_AMBULATORY_CARE_PROVIDER_SITE_OTHER)
Admission: RE | Admit: 2015-07-27 | Discharge: 2015-07-27 | Disposition: A | Discharge status: Home / Self Care | Payer: 59 | Source: Ambulatory Visit | Attending: Family Medicine | Admitting: Family Medicine

## 2015-07-27 VITALS — BP 144/82 | HR 87 | Ht 65.0 in | Wt 256.0 lb

## 2015-07-27 DIAGNOSIS — M542 Cervicalgia: Secondary | ICD-10-CM | POA: Diagnosis not present

## 2015-07-27 DIAGNOSIS — M1711 Unilateral primary osteoarthritis, right knee: Secondary | ICD-10-CM | POA: Diagnosis not present

## 2015-07-27 DIAGNOSIS — M5030 Other cervical disc degeneration, unspecified cervical region: Secondary | ICD-10-CM | POA: Diagnosis not present

## 2015-07-27 MED ORDER — TIZANIDINE HCL 4 MG PO TABS: 4.0000 mg | ORAL_TABLET | Freq: Every evening | ORAL | Status: DC

## 2015-07-27 NOTE — Assessment & Plan Note (Signed)
I believe the patient's bilateral shoulder pain is secondary to more of a cervical radiculopathy. Patient has x-rays pending. I'm guessing we will see severe arthritis. Patient is a hernia on gabapentin for her diabetic neuropathy. Patient given some topical anti-inflammatories and we discussed posture. Patient declined formal physical therapy. Zanaflex prescribed. Patient will continue to monitor her iron deficiency and take over-the-counter medications. We'll have her add Tylenol 3 times daily. Patient come back and if any worsening symptoms possibly advance imaging would be warranted if any weakness or numbness seems to be chronic in the shoulders or arms.

## 2015-07-27 NOTE — Progress Notes (Signed)
  CC: Right knee followup  HPI: Patient is a very pleasant 65 year old obese female Coming in for followup of her right knee pain. 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  Patient has synvisc over a year ago and was doing well. Recently increase pain and some mild instability somewhat concern she could fall.  Looking for improemvent No radiation of pain. No weakness mild swelling   Also having bilateral shoulder pain started on right and then moved left radiating down the arms sometimes, may have association with the neck and have some pain.  NO weakness no swelling.  Tries tostay active but worse with more activity hard to get comfortable at night Rates severity of 7/10 Not responding to OTC meds.     Medications reviewed all in the electronic medical record.   Review of Systems: No headache, visual changes, nausea, vomiting, diarrhea, constipation, dizziness, abdominal pain, skin rash, fevers, chills, night sweats, weight loss, swollen lymph nodes, chest pain, shortness of breath, mood changes.   Objective:    Blood pressure 144/82, pulse 87, height 5' 5"  (1.651 m), weight 256 lb (116.121 kg), SpO2 99 %.   General: No apparent distress alert and oriented x3 mood and affect normal, dressed appropriately.  HEENT: Pupils equal, extraocular movements intact Respiratory: Patient's speak in full sentences and does not appear short of breath Cardiovascular: No lower extremity edema, non tender, no erythema Skin: Warm dry intact with no signs of infection or rash on extremities or on axial skeleton. Abdomen: Soft nontender Neuro: Cranial nerves II through XII are intact, neurovascularly intact in all extremities with 2+ DTRs and 2+ pulses. Lymph: No lymphadenopathy of posterior or anterior cervical chain or axillae bilaterally.  Gait valgus deformity  MSK: Non tender with full range of motion and good stability and symmetric strength and tone of shoulders,  elbows, wrist, hip, and ankles bilaterally.  Neck exam No gross deformity, swelling, bruising.oss of lordosis TTP diffusely paraspinal musculature.  No midline/bony TTP. FROM neck - pain . BUE strength 5/5.   Sensation intact to light touch.   2+ equal reflexes in triceps, biceps, brachioradialis tendons. Positive! spurlings more lateralized left arm.  NV intact distal BUEs.  Knee: Right Palpation normal with no warmth, patient does have  medial joint line tenderness but improved ROM full in flexion and extension and lower leg rotation. Ligaments with solid consistent endpoints including ACL, PCL, LCL, MCL. Positive Mcmurray's, Apley's, and Thessalonian tests. Painful patellar compression. Patellar glide with moderate crepitus. Patellar and quadriceps tendons unremarkable. Hamstring and quadriceps strength is normal.  Contralateral knee also has some medial and joint tenderness as well as osteoarthritic changes. Patient also has valgus deformity.  Procedure note After verbal and written consent given pt was prepped with betadine.  1:3 kenalog 40 to lidocaine used in right knee.  Pt minimal bleeding dressed with band aid, Pt given red flags to look for pt had better pain control immediatly.      Impression and Recommendations:     This case required medical decision making of moderate complexity.

## 2015-07-27 NOTE — Progress Notes (Signed)
Pre visit review using our clinic review tool, if applicable. No additional management support is needed unless otherwise documented below in the visit note. 

## 2015-07-27 NOTE — Patient Instructions (Signed)
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  Ice 20 minutes 2 times daily. Usually after activity and before bed. On wall with heels, butt shoulder and head touching for a goal of 5 minutes daily  See me again in 2 weeks to start which series is covered with your knee.

## 2015-07-27 NOTE — Assessment & Plan Note (Signed)
Patient given injection today. Tolerated procedure well. Discussed with patient she had such great response to the Synvisc series. Patient will check with insurance company. I think that she will do very well if this will be covered again. If so then we will start the series at follow-up in 3 weeks. Encourage her to try all other conservative therapy at this time and she has failed everything including formal physical therapy previously.

## 2015-07-29 ENCOUNTER — Telehealth: Payer: Self-pay | Admitting: *Deleted

## 2015-07-29 NOTE — Telephone Encounter (Signed)
Neck shows a lot of arthritis.  Continue with the plan may take through the weekend.

## 2015-07-29 NOTE — Telephone Encounter (Signed)
Pt called stating that she spoke to her insurance & they cover Synvisc @ 85% after she meets her deductible. She stated that she has about $200 left before she meets her deductible so she would like to wait until she meets that before getting the injections. Pt would also like the results of her x-ray. She states that she is still in a lot of pain.

## 2015-07-30 NOTE — Telephone Encounter (Signed)
Left message she could call back for xray results but that she should stay with the original plan through the weekend per Dr. Tamala Julian.

## 2015-07-31 ENCOUNTER — Other Ambulatory Visit: Payer: Self-pay | Admitting: Internal Medicine

## 2015-08-02 ENCOUNTER — Other Ambulatory Visit (INDEPENDENT_AMBULATORY_CARE_PROVIDER_SITE_OTHER): Payer: 59

## 2015-08-02 ENCOUNTER — Encounter: Payer: Self-pay | Admitting: Family Medicine

## 2015-08-02 ENCOUNTER — Ambulatory Visit (INDEPENDENT_AMBULATORY_CARE_PROVIDER_SITE_OTHER): Payer: 59 | Admitting: Family Medicine

## 2015-08-02 VITALS — BP 136/84 | HR 70 | Ht 65.0 in | Wt 256.0 lb

## 2015-08-02 DIAGNOSIS — M25511 Pain in right shoulder: Secondary | ICD-10-CM | POA: Diagnosis not present

## 2015-08-02 DIAGNOSIS — M5010 Cervical disc disorder with radiculopathy, unspecified cervical region: Secondary | ICD-10-CM

## 2015-08-02 DIAGNOSIS — M5030 Other cervical disc degeneration, unspecified cervical region: Secondary | ICD-10-CM

## 2015-08-02 DIAGNOSIS — M25512 Pain in left shoulder: Secondary | ICD-10-CM

## 2015-08-02 NOTE — Patient Instructions (Addendum)
Good to see you  Ice is your friend If not a lot better we will need to look at your neck closer.  Send message in next week so I know how you are doing.

## 2015-08-02 NOTE — Progress Notes (Signed)
CC: Neck pain follow-up  HPI: Patient is here following up for neck pain. Patient was having more neck pain on the left side. Seems to be radiating to both shoulders but now localized to left shoulder. States that she's having even difficult he lifting her arm. Rates the severity of pain a 7 out of 10. Waking her up at night. Does not know what possibly could've caused injury. Denies any weakness but states that she is unable to do certain activities because of the pain. Patient unable to do anti-inflammatories.  Past Medical History  Diagnosis Date  . Asthma     childhood  . Hemorrhoids   . Autonomic neuropathy     diabetic  . Hypertension   . Gastroparesis   . Unspecified gastritis and gastroduodenitis without mention of hemorrhage   . Personal history of colonic polyps 03/2010    hyperplastic  . Chest pain     intermittant left sided chest pain, non-exertional, lasting 1-2 min  . Nasal congestion current    sore throat, ears-on antibiotic  . PONV (postoperative nausea and vomiting)   . Diabetes mellitus     type II, Hemoglobin A1C 9.9 10/05/2011  . GERD (gastroesophageal reflux disease)   . Diverticulosis   . IBS (irritable bowel syndrome)   . Fatty liver   . Celiac disease   . Arthritis   . Cataract    Past Surgical History  Procedure Laterality Date  . Tubal ligation    . Appendectomy    . Left breast cyst removal  2000  . Dilatation & currettage/hysteroscopy with resectocope N/A 03/24/2013    Procedure: DILATATION & CURETTAGE, HYSTEROSCOPY WITH RESECTION;  Surgeon: Olga Millers, MD;  Location: [facility];  Service: Gynecology;  Laterality: N/A;   Social History  Substance Use Topics  . Smoking status: Never Smoker   . Smokeless tobacco: Never Used  . Alcohol Use: No   Allergies  Allergen Reactions  . Nsaids Nausea And Vomiting    Cannot tolerate large doses    Family History  Problem Relation Age of Onset  . Colon cancer Sister   . Diabetes Sister   .  Diabetes Brother   . Heart disease Father   . Other Father     2 collapsed lungs       Medications reviewed all in the electronic medical record.   Review of Systems: No headache, visual changes, nausea, vomiting, diarrhea, constipation, dizziness, abdominal pain, skin rash, fevers, chills, night sweats, weight loss, swollen lymph nodes, chest pain, shortness of breath, mood changes.   Objective:    Blood pressure 136/84, pulse 70, height 5' 5"  (1.651 m), weight 256 lb (116.121 kg), SpO2 98 %.   General: No apparent distress alert and oriented x3 mood and affect normal, dressed appropriately.  HEENT: Pupils equal, extraocular movements intact Respiratory: Patient's speak in full sentences and does not appear short of breath Cardiovascular: No lower extremity edema, non tender, no erythema Skin: Warm dry intact with no signs of infection or rash on extremities or on axial skeleton. Abdomen: Soft nontender Neuro: Cranial nerves II through XII are intact, neurovascularly intact in all extremities with 2+ DTRs and 2+ pulses. Lymph: No lymphadenopathy of posterior or anterior cervical chain or axillae bilaterally.  Gait valgus deformity  MSK: Non tender with full range of motion and good stability and symmetric strength and tone of shoulders, elbows, wrist, hip, and ankles bilaterally.  Neck exam No gross deformity, swelling, bruising.oss of lordosis TTP diffusely  paraspinal musculature.  No midline/bony TTP. FROM neck - pain . BUE strength 5/5.   Sensation intact to light touch.   2+ equal reflexes in triceps, biceps, brachioradialis tendons. Positive! spurlings more lateralized left arm still present NV intact distal BUEs.  Shoulder: left Inspection reveals no abnormalities, atrophy or asymmetry. Palpation is normal with no tenderness over AC joint or bicipital groove. ROM is full in all planes passively. Rotator cuff strength 4 out of 5 compared to the contralateral side signs  of impingement with positive Neer and Hawkin's tests, but negative empty can sign. Speeds and Yergason's tests normal. Positive O'Brien's. Normal scapular function observed. No painful arc and no drop arm sign. No apprehension sign    Procedure: Real-time Ultrasound Guided Injection of left glenohumeral joint Device: GE Logiq E  Ultrasound guided injection is preferred based studies that show increased duration, increased effect, greater accuracy, decreased procedural pain, increased response rate with ultrasound guided versus blind injection.  Verbal informed consent obtained.  Time-out conducted.  Noted no overlying erythema, induration, or other signs of local infection.  Skin prepped in a sterile fashion.  Local anesthesia: Topical Ethyl chloride.  With sterile technique and under real time ultrasound guidance:  Joint visualized.  23g 1  inch needle inserted posterior approach. Pictures taken for needle placement. Patient did have injection of 2 cc of 1% lidocaine, 2 cc of 0.5% Marcaine, and 1.0 cc of Kenalog 40 mg/dL. Completed without difficulty  Pain immediately resolved suggesting accurate placement of the medication.  Advised to call if fevers/chills, erythema, induration, drainage, or persistent bleeding.  Images permanently stored and available for review in the ultrasound unit.  Impression: Technically successful ultrasound guided injection.    Impression and Recommendations:     This case required medical decision making of moderate complexity.

## 2015-08-02 NOTE — Assessment & Plan Note (Signed)
Patient given injection today and tolerated the procedure fairly well. We discussed icing regimen and home exercises. We discussed which activities to do an which was to avoid. I do think that the primary problem is likely more of a cervical radiculopathy. We discussed avoiding lifting with her hands out of peripheral vision. No significant improvement I would like advance imaging of the neck.

## 2015-08-02 NOTE — Assessment & Plan Note (Signed)
As above.

## 2015-08-02 NOTE — Telephone Encounter (Signed)
Spoke with pt, she is coming in for an appt today @ 230p to try a shoulder inejction.

## 2015-08-02 NOTE — Progress Notes (Signed)
Pre visit review using our clinic review tool, if applicable. No additional management support is needed unless otherwise documented below in the visit note. 

## 2015-08-02 NOTE — Assessment & Plan Note (Signed)
Severe arthritic changes with radicular symptoms. I do think that if patient continues to have trouble and not responding to the gabapentin and did not respond to the intra-articular injection of the shoulder advanced imaging of the neck would be afforded. MRI would be the first line. Patient will call in the next week and if worsening symptoms we will get this. Patient knows if she has any weakness or constant numbness she needs to seek medical attention immediately.

## 2015-08-05 ENCOUNTER — Other Ambulatory Visit: Payer: Self-pay | Admitting: Family Medicine

## 2015-08-05 NOTE — Telephone Encounter (Signed)
Refill done.  

## 2015-08-09 ENCOUNTER — Other Ambulatory Visit: Payer: Self-pay | Admitting: Endocrinology

## 2015-08-11 LAB — HM DIABETES EYE EXAM

## 2015-08-12 ENCOUNTER — Ambulatory Visit: Payer: 59 | Admitting: Family Medicine

## 2015-08-20 ENCOUNTER — Encounter: Payer: Self-pay | Admitting: Endocrinology

## 2015-09-06 ENCOUNTER — Other Ambulatory Visit: Payer: Self-pay | Admitting: Endocrinology

## 2015-09-30 ENCOUNTER — Other Ambulatory Visit: Payer: Self-pay | Admitting: Internal Medicine

## 2015-10-11 ENCOUNTER — Other Ambulatory Visit: Payer: Self-pay | Admitting: Endocrinology

## 2015-10-25 ENCOUNTER — Other Ambulatory Visit: Payer: Self-pay | Admitting: Obstetrics and Gynecology

## 2015-10-25 DIAGNOSIS — N8500 Endometrial hyperplasia, unspecified: Secondary | ICD-10-CM | POA: Diagnosis not present

## 2015-10-25 DIAGNOSIS — N858 Other specified noninflammatory disorders of uterus: Secondary | ICD-10-CM | POA: Diagnosis not present

## 2015-10-25 DIAGNOSIS — Z1231 Encounter for screening mammogram for malignant neoplasm of breast: Secondary | ICD-10-CM | POA: Diagnosis not present

## 2015-10-29 ENCOUNTER — Ambulatory Visit (INDEPENDENT_AMBULATORY_CARE_PROVIDER_SITE_OTHER): Payer: Medicare Other | Admitting: Family Medicine

## 2015-10-29 VITALS — BP 140/90 | HR 92 | Resp 16 | Wt 263.0 lb

## 2015-10-29 DIAGNOSIS — M1711 Unilateral primary osteoarthritis, right knee: Secondary | ICD-10-CM | POA: Insufficient documentation

## 2015-10-29 DIAGNOSIS — M170 Bilateral primary osteoarthritis of knee: Secondary | ICD-10-CM | POA: Diagnosis not present

## 2015-10-29 DIAGNOSIS — M25562 Pain in left knee: Secondary | ICD-10-CM | POA: Diagnosis not present

## 2015-10-29 MED ORDER — VITAMIN D (ERGOCALCIFEROL) 1.25 MG (50000 UNIT) PO CAPS: 50000.0000 [IU] | ORAL_CAPSULE | ORAL | Status: DC

## 2015-10-29 NOTE — Assessment & Plan Note (Signed)
Degenerative arthritic changes of knees bilaterally. We discussed icing regimen and home exercises. Patient will be sent to formal physical therapy. Patient declined custom bracing or injections at this time but would like to discuss him again in follow-up in 4 weeks.  Spent  25 minutes with patient face-to-face and had greater than 50% of counseling including as described above in assessment and plan.

## 2015-10-29 NOTE — Progress Notes (Signed)
Pre visit review using our clinic review tool, if applicable. No additional management support is needed unless otherwise documented below in the visit note. 

## 2015-10-29 NOTE — Patient Instructions (Addendum)
Good to see you  Ice 20 minutes 2 times daily. Usually after activity and before bed. Keep using your brace and we can always get you a custom brace.  Tylenol 526m 3 times a day  Turmeric 5046mtwice daily  Once weekly vitamin D Physical therapy will be calling you See me again in 4 weeks. We may need injections at that time.

## 2015-10-29 NOTE — Progress Notes (Signed)
[name] [address] [place], [place] [address] Phone: [phone] Subjective:     CC:  Left knee pain  ZRA:[id number] Jessica [name] is a 65 y.o. female coming in with complaint of left knee pain. Patient had x-rays back in 2015 that were independently visualized by me. 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 Patient used to have pain in the right knee and still has some. Patient was having worsening symptoms in the left knee. No associated with some swelling. Some increasing instability. Sometimes decrease her activity secondary to instability. Patient is concerned she does not know if she is going to stay on her feet. Patient wants to avoid any type of surgical intervention but states that the pain is sometimes debilitating. Patient states walking greater than 200 yards can be very difficult. States that she does too much activity she'll has swelling. Denies any radiation down the leg. Denies any associated back pain.     Past Medical History  Diagnosis Date  . Asthma     childhood  . Hemorrhoids   . Autonomic neuropathy     diabetic  . Hypertension   . Gastroparesis   . Unspecified gastritis and gastroduodenitis without mention of hemorrhage   . Personal history of colonic polyps 03/2010    hyperplastic  . Chest pain     intermittant left sided chest pain, non-exertional, lasting 1-2 min  . Nasal congestion current    sore throat, ears-on antibiotic  . PONV (postoperative nausea and vomiting)   . Diabetes mellitus     type II, Hemoglobin A1C 9.9 10/05/2011  . GERD (gastroesophageal reflux disease)   . Diverticulosis   . IBS (irritable bowel syndrome)   . Fatty liver   . Celiac disease   . Arthritis   . Cataract    Past Surgical History  Procedure Laterality Date  . Tubal ligation    . Appendectomy    . Left breast cyst removal  2000  . Dilatation & currettage/hysteroscopy with resectocope  N/A 03/24/2013    Procedure: DILATATION & CURETTAGE, HYSTEROSCOPY WITH RESECTION;  Surgeon: Olga Millers, MD;  Location: [facility];  Service: Gynecology;  Laterality: N/A;   Social History   Social History  . Marital Status: Married    Spouse Name: Devar Brook  . Number of Children: 2  . Years of Education: N/A   Occupational History  . works for school system    Social History Main Topics  . Smoking status: Never Smoker   . Smokeless tobacco: Never Used  . Alcohol Use: No  . Drug Use: No  . Sexual Activity: Not on file   Other Topics Concern  . Not on file   Social History Narrative   Allergies  Allergen Reactions  . Nsaids Nausea And Vomiting    Cannot tolerate large doses    Family History  Problem Relation Age of Onset  . Colon cancer Sister   . Diabetes Sister   . Diabetes Brother   . Heart disease Father   . Other Father     2 collapsed lungs    Past medical history, social, surgical and family history all reviewed in electronic medical record.  No pertanent information unless stated regarding to the chief complaint.   Review of Systems: No headache, visual changes, nausea, vomiting, diarrhea, constipation, dizziness, abdominal pain, skin rash, fevers, chills, night sweats, weight loss, swollen lymph nodes, body aches, joint swelling, muscle aches, chest  pain, shortness of breath, mood changes.   Objective There were no vitals taken for this visit.  General: No apparent distress alert and oriented x3 mood and affect normal, dressed appropriately. obese HEENT: Pupils equal, extraocular movements intact  Respiratory: Patient's speak in full sentences and does not appear short of breath  Cardiovascular: No lower extremity edema, non tender, no erythema  Skin: Warm dry intact with no signs of infection or rash on extremities or on axial skeleton.  Abdomen: Soft nontender  Neuro: Cranial nerves II through XII are intact, neurovascularly intact in all extremities  with 2+ DTRs and 2+ pulses.  Lymph: No lymphadenopathy of posterior or anterior cervical chain or axillae bilaterally.  Gait antalgic gait.  MSK:  Non tender with full range of motion and good stability and symmetric strength and tone of shoulders, elbows, wrist, hip, and ankles bilaterally. Mild arthritic changes of multiple joints Knee:Bilateral Valgus deformity of the knees bilaterally. Patient does have significant calf to thigh ratio Tenderness mostly over the medial joint line bilaterally ROM full in flexion and extension and lower leg rotation. Instability noted with valgus force painful patellar compression. Patellar glide with mildcrepitus. Patellar and quadriceps tendons unremarkable. Hamstring and quadriceps strength is normal.       Impression and Recommendations:     This case required medical decision making of moderate complexity.      Note: This dictation was prepared with Dragon dictation along with smaller phrase technology. Any transcriptional errors that result from this process are unintentional.

## 2015-11-01 ENCOUNTER — Other Ambulatory Visit: Payer: Self-pay | Admitting: Obstetrics and Gynecology

## 2015-11-03 ENCOUNTER — Ambulatory Visit
Admission: RE | Admit: 2015-11-03 | Discharge: 2015-11-03 | Disposition: A | Discharge status: Home / Self Care | Payer: 59 | Source: Ambulatory Visit | Attending: Endocrinology | Admitting: Endocrinology

## 2015-11-03 ENCOUNTER — Ambulatory Visit (INDEPENDENT_AMBULATORY_CARE_PROVIDER_SITE_OTHER): Payer: 59 | Admitting: Endocrinology

## 2015-11-03 VITALS — BP 138/70 | HR 82 | Temp 97.6°F | Ht 65.0 in | Wt 265.4 lb

## 2015-11-03 DIAGNOSIS — M9903 Segmental and somatic dysfunction of lumbar region: Secondary | ICD-10-CM

## 2015-11-03 DIAGNOSIS — Z119 Encounter for screening for infectious and parasitic diseases, unspecified: Secondary | ICD-10-CM

## 2015-11-03 DIAGNOSIS — E1143 Type 2 diabetes mellitus with diabetic autonomic (poly)neuropathy: Secondary | ICD-10-CM

## 2015-11-03 DIAGNOSIS — J309 Allergic rhinitis, unspecified: Secondary | ICD-10-CM

## 2015-11-03 DIAGNOSIS — R0602 Shortness of breath: Secondary | ICD-10-CM | POA: Diagnosis not present

## 2015-11-03 DIAGNOSIS — Z0001 Encounter for general adult medical examination with abnormal findings: Secondary | ICD-10-CM | POA: Diagnosis not present

## 2015-11-03 DIAGNOSIS — M999 Biomechanical lesion, unspecified: Secondary | ICD-10-CM

## 2015-11-03 DIAGNOSIS — R0600 Dyspnea, unspecified: Secondary | ICD-10-CM | POA: Insufficient documentation

## 2015-11-03 DIAGNOSIS — Z23 Encounter for immunization: Secondary | ICD-10-CM

## 2015-11-03 DIAGNOSIS — R9431 Abnormal electrocardiogram [ECG] [EKG]: Secondary | ICD-10-CM | POA: Diagnosis not present

## 2015-11-03 DIAGNOSIS — Z794 Long term (current) use of insulin: Secondary | ICD-10-CM

## 2015-11-03 DIAGNOSIS — Z0000 Encounter for general adult medical examination without abnormal findings: Secondary | ICD-10-CM | POA: Insufficient documentation

## 2015-11-03 DIAGNOSIS — R05 Cough: Secondary | ICD-10-CM | POA: Diagnosis not present

## 2015-11-03 DIAGNOSIS — D509 Iron deficiency anemia, unspecified: Secondary | ICD-10-CM

## 2015-11-03 DIAGNOSIS — R918 Other nonspecific abnormal finding of lung field: Secondary | ICD-10-CM | POA: Insufficient documentation

## 2015-11-03 LAB — HEPATIC FUNCTION PANEL
ALT: 16 U/L (ref 0–35)
AST: 17 U/L (ref 0–37)
Albumin: 3.8 g/dL (ref 3.5–5.2)
Alkaline Phosphatase: 73 U/L (ref 39–117)
Bilirubin, Direct: 0.1 mg/dL (ref 0.0–0.3)
Total Bilirubin: 0.5 mg/dL (ref 0.2–1.2)
Total Protein: 6.6 g/dL (ref 6.0–8.3)

## 2015-11-03 LAB — URINALYSIS, ROUTINE W REFLEX MICROSCOPIC
Bilirubin Urine: NEGATIVE
Hgb urine dipstick: NEGATIVE
Ketones, ur: NEGATIVE
Leukocytes, UA: NEGATIVE
Nitrite: NEGATIVE
RBC / HPF: NONE SEEN (ref 0–?)
Specific Gravity, Urine: 1.025 (ref 1.000–1.030)
Total Protein, Urine: NEGATIVE
Urine Glucose: >=1000 — AB
Urobilinogen, UA: 0.2 (ref 0.0–1.0)
pH: 5.5 (ref 5.0–8.0)

## 2015-11-03 LAB — CBC WITH DIFFERENTIAL/PLATELET
Basophils Absolute: 0 10*3/uL (ref 0.0–0.1)
Basophils Relative: 0.4 % (ref 0.0–3.0)
Eosinophils Absolute: 0.1 10*3/uL (ref 0.0–0.7)
Eosinophils Relative: 1.9 % (ref 0.0–5.0)
HCT: 40.7 % (ref 36.0–46.0)
Hemoglobin: 13.7 g/dL (ref 12.0–15.0)
Lymphocytes Relative: 29.9 % (ref 12.0–46.0)
Lymphs Abs: 1.3 10*3/uL (ref 0.7–4.0)
MCHC: 33.6 g/dL (ref 30.0–36.0)
MCV: 95.8 fl (ref 78.0–100.0)
Monocytes Absolute: 0.2 10*3/uL (ref 0.1–1.0)
Monocytes Relative: 5.2 % (ref 3.0–12.0)
Neutro Abs: 2.8 10*3/uL (ref 1.4–7.7)
Neutrophils Relative %: 62.6 % (ref 43.0–77.0)
Platelets: 240 10*3/uL (ref 150.0–400.0)
RBC: 4.24 Mil/uL (ref 3.87–5.11)
RDW: 13.3 % (ref 11.5–15.5)
WBC: 4.4 10*3/uL (ref 4.0–10.5)

## 2015-11-03 LAB — BASIC METABOLIC PANEL
BUN: 18 mg/dL (ref 6–23)
CO2: 29 mEq/L (ref 19–32)
Calcium: 9.3 mg/dL (ref 8.4–10.5)
Chloride: 105 mEq/L (ref 96–112)
Creatinine, Ser: 1.03 mg/dL (ref 0.40–1.20)
GFR: 69.14 mL/min (ref >60.00)
Glucose, Bld: 198 mg/dL — ABNORMAL HIGH (ref 70–99)
Potassium: 4.3 mEq/L (ref 3.5–5.1)
Sodium: 139 mEq/L (ref 135–145)

## 2015-11-03 LAB — MICROALBUMIN / CREATININE URINE RATIO
Creatinine,U: 150.6 mg/dL
Microalb Creat Ratio: 2.4 mg/g (ref 0.0–30.0)
Microalb, Ur: 3.6 mg/dL — ABNORMAL HIGH (ref 0.0–1.9)

## 2015-11-03 LAB — POCT GLYCOSYLATED HEMOGLOBIN (HGB A1C): Hemoglobin A1C: 6.9

## 2015-11-03 LAB — TSH: TSH: 1.55 u[IU]/mL (ref 0.35–4.50)

## 2015-11-03 LAB — IBC PANEL
Iron: 91 ug/dL (ref 42–145)
Saturation Ratios: 25 % (ref 20.0–50.0)
Transferrin: 260 mg/dL (ref 212.0–360.0)

## 2015-11-03 LAB — LIPID PANEL
Cholesterol: 128 mg/dL (ref 0–200)
HDL: 44.9 mg/dL (ref >39.00)
LDL Cholesterol: 70 mg/dL (ref 0–99)
NonHDL: 83.49
Total CHOL/HDL Ratio: 3
Triglycerides: 67 mg/dL (ref 0.0–149.0)
VLDL: 13.4 mg/dL (ref 0.0–40.0)

## 2015-11-03 LAB — VITAMIN D 25 HYDROXY (VIT D DEFICIENCY, FRACTURES): VITD: 29.57 ng/mL — ABNORMAL LOW (ref 30.00–100.00)

## 2015-11-03 MED ORDER — CEFUROXIME AXETIL 250 MG PO TABS: 250.0000 mg | ORAL_TABLET | Freq: Two times a day (BID) | ORAL | Status: DC

## 2015-11-03 MED ORDER — INSULIN NPH ISOPHANE & REGULAR (70-30) 100 UNIT/ML ~~LOC~~ SUSP: SUBCUTANEOUS | Status: DC

## 2015-11-03 NOTE — Patient Instructions (Addendum)
Please see an allergy specialist.  you will receive a phone call, about a day and time for an appointment blood tests and a chest x-ray are requested for you today.  We'll let you know about the results. Please consider these measures for your health:  minimize alcohol.  Do not use tobacco products.  Have a colonoscopy at least every 10 years from age 65.  Women should have an annual mammogram from age 65.  Keep firearms safely stored.  Always use seat belts.  have working smoke alarms in your home.  See an eye doctor and dentist regularly.  Never drive under the influence of alcohol or drugs (including prescription drugs).   Please reduce the insulin to 110 units with breakfast, and 50 units with the evening meal.   Please come back for a follow-up appointment in 4 months.

## 2015-11-03 NOTE — Progress Notes (Signed)
Pre visit review using our clinic review tool, if applicable. No additional management support is needed unless otherwise documented below in the visit note. 

## 2015-11-03 NOTE — Progress Notes (Signed)
Subjective:    Patient ID: Jessica [name], female    DOB: August 10, 1950, 65 y.o.   MRN: [id number]  HPI Pt is here for regular wellness examination, and is feeling pretty well in general, and says chronic med probs are stable, except as noted below. Past Medical History  Diagnosis Date  . Asthma     childhood  . Hemorrhoids   . Autonomic neuropathy     diabetic  . Hypertension   . Gastroparesis   . Unspecified gastritis and gastroduodenitis without mention of hemorrhage   . Personal history of colonic polyps 03/2010    hyperplastic  . Chest pain     intermittant left sided chest pain, non-exertional, lasting 1-2 min  . Nasal congestion current    sore throat, ears-on antibiotic  . PONV (postoperative nausea and vomiting)   . Diabetes mellitus     type II, Hemoglobin A1C 9.9 10/05/2011  . GERD (gastroesophageal reflux disease)   . Diverticulosis   . IBS (irritable bowel syndrome)   . Fatty liver   . Celiac disease   . Arthritis   . Cataract     Past Surgical History  Procedure Laterality Date  . Tubal ligation    . Appendectomy    . Left breast cyst removal  2000  . Dilatation & currettage/hysteroscopy with resectocope N/A 03/24/2013    Procedure: DILATATION & CURETTAGE, HYSTEROSCOPY WITH RESECTION;  Surgeon: Olga Millers, MD;  Location: [facility];  Service: Gynecology;  Laterality: N/A;    Social History   Social History  . Marital Status: Married    Spouse Name: Devar Kolker  . Number of Children: 2  . Years of Education: N/A   Occupational History  . works for school system    Social History Main Topics  . Smoking status: Never Smoker   . Smokeless tobacco: Never Used  . Alcohol Use: No  . Drug Use: No  . Sexual Activity: Not on file   Other Topics Concern  . Not on file   Social History Narrative    Current Outpatient Prescriptions on File Prior to Visit  Medication Sig Dispense Refill  . atorvastatin (LIPITOR) 40 MG tablet TAKE 1 TABLET BY MOUTH  EVERY DAY 30 tablet 1  . B-D INS SYR ULTRAFINE 1CC/31G 31G X 5/16" 1 ML MISC USES UP TO 3 A DAY 100 each 2  . clidinium-chlordiazePOXIDE (LIBRAX) 5-2.5 MG per capsule Take 1 capsule by mouth 3 (three) times daily as needed. 100 capsule 6  . DEXILANT 60 MG capsule TAKE 1 CAPSULE (60 MG TOTAL) BY MOUTH DAILY. 30 capsule 3  . Fluticasone-Salmeterol (ADVAIR) 100-50 MCG/DOSE AEPB Inhale 1 puff into the lungs 2 (two) times daily. 1 each 3  . gabapentin (NEURONTIN) 300 MG capsule TAKE 1 CAPSULE BY MOUTH 3 TIMES DAILY 180 capsule 2  . gabapentin (NEURONTIN) 300 MG capsule TAKE 1 CAPSULE BY MOUTH 3 TIMES DAILY 270 capsule 1  . glucose blood (ONE TOUCH ULTRA TEST) test strip USE TO TEST BLOOD SUGAR 2 TIMES DAILY. DX: E11.43 100 each 4  . hyoscyamine (LEVSIN SL) 0.125 MG SL tablet PLACE 1 TABLET UNDER TONGUE EVERY 4 HOURS AS NEEDED 30 tablet 1  . ibuprofen (ADVIL,MOTRIN) 100 MG tablet Take 100 mg by mouth every 6 (six) hours as needed for pain or fever.    Marland Kitchen ketoconazole (NIZORAL) 2 % cream APPLY TO AFFECTED AREA EVERY DAY 30 g 2  . polyethylene glycol powder (GLYCOLAX/MIRALAX) powder TAKE 1 CAPFUL (  17 GRAMS) DISSOLVED IN AT LEAST 8 OUNCES WATER/JUICE AND DRINK EVERY NIGHT. 527 g 1  . ranitidine (ZANTAC) 150 MG tablet TAKE 1 TABLET (150 MG TOTAL) BY MOUTH AT BEDTIME AS NEEDED FOR HEARTBURN. 30 tablet 2  . tiZANidine (ZANAFLEX) 4 MG tablet Take 1 tablet (4 mg total) by mouth Nightly. 30 tablet 2  . valACYclovir (VALTREX) 500 MG tablet TAKE 1 TABLET BY MOUTH EVERY DAY 60 tablet 3  . valsartan (DIOVAN) 320 MG tablet TAKE 1 TABLET BY MOUTH EVERY DAY 30 tablet 2  . Vitamin D, Ergocalciferol, (DRISDOL) 50000 units CAPS capsule Take 1 capsule (50,000 Units total) by mouth every 7 (seven) days. 12 capsule 0   No current facility-administered medications on file prior to visit.    Allergies  Allergen Reactions  . Nsaids Nausea And Vomiting    Cannot tolerate large doses     Family History  Problem Relation  Age of Onset  . Colon cancer Sister   . Diabetes Sister   . Diabetes Brother   . Heart disease Father   . Other Father     2 collapsed lungs    BP 138/70 mmHg  Pulse 82  Temp(Src) 97.6 F (36.4 C) (Oral)  Ht 5' 5"  (1.651 m)  Wt 265 lb 6 oz (120.373 kg)  BMI 44.16 kg/m2  SpO2 97%   Review of Systems  Constitutional: Negative for fever.  HENT: Negative for hearing loss.   Eyes: Negative for visual disturbance.  Respiratory: Negative for cough.   Cardiovascular: Negative for chest pain.  Gastrointestinal: Negative for anal bleeding.  Endocrine: Negative for cold intolerance.  Genitourinary: Negative for hematuria.  Musculoskeletal: Negative for gait problem.  Skin: Negative for rash.  Allergic/Immunologic: Negative for environmental allergies.  Neurological: Negative for syncope.  Hematological: Bruises/bleeds easily.  Psychiatric/Behavioral: Negative for decreased concentration.       Objective:   Physical Exam VS: see vs page GEN: no distress HEAD: head: no deformity eyes: no periorbital swelling, no proptosis external nose and ears are normal mouth: no lesion seen NECK: supple, thyroid is not enlarged CHEST WALL: no deformity LUNGS:  Clear to auscultation BREASTS: sees gyn CV: reg rate and rhythm, no murmur ABD: abdomen is soft, nontender.  no hepatosplenomegaly.  not distended.  no hernia GENITALIA/RECTAL: sees gyn.   MUSCULOSKELETAL: muscle bulk and strength are grossly normal.  no obvious joint swelling.  gait is normal and steady PULSES: no carotid bruit NEURO:  cn 2-12 grossly intact.   readily moves all 4's.   SKIN:  Normal texture and temperature.  No rash or suspicious lesion is visible.   NODES:  None palpable at the neck.  PSYCH: alert, well-oriented.  Does not appear anxious nor depressed.      Assessment & Plan:  Wellness visit today, with problems stable, except as noted.   SEPARATE EVALUATION FOLLOWS--EACH PROBLEM HERE IS NEW, NOT  RESPONDING TO TREATMENT, OR POSES SIGNIFICANT RISK TO THE PATIENT'S HEALTH: HISTORY OF THE PRESENT ILLNESS: Pt returns for f/u of diabetes mellitus: DM type: Insulin-requiring type 2. Dx'ed: 2000.  Complications: polyneuropathy and autonomic neuropathy.   Therapy: insulin since 2005 GDM: never DKA: never Severe hypoglycemia: never.  Pancreatitis: never.  Other: Pt says her ability to care for her DM continues to be compromised by her husband's illness; she has done better on a BID insulin regimen; she takes human insulin, due to cost.   Interval history: She has mild hypoglycemia, and these episodes are mild.  It  happens in the middle of the night.   PAST MEDICAL HISTORY reviewed and up to date today.  REVIEW OF SYSTEMS: PHYSICAL EXAMINATION: VITAL SIGNS:  See vs page GENERAL: no distress Pulses: dorsalis pedis intact bilat.   MSK: no deformity of the feet.  CV: no leg edema. Skin:  no ulcer on the feet.  normal color and temp on the feet.   Neuro: sensation is intact to touch on the feet, but decreased from normal.  LAB/XRAY RESULTS:  CXR: NAD i personally reviewed electrocardiogram tracing (today):  Indication: DM Impression: rightward axis.   Lab Results  Component Value Date   HGBA1C 6.9 11/03/2015  IMPRESSION: Abnormal ecg, new: uncertain etiology.  Given normal CXR, we'll follow for now.  DM: overcontrolled, given this regimen, which does match insulin to her changing needs throughout the day.   PLAN: Please reduce the insulin to 110 units with breakfast, and 50 units with the evening meal.  Renato Shin, MD

## 2015-11-04 ENCOUNTER — Encounter (HOSPITAL_COMMUNITY)
Admission: RE | Admit: 2015-11-04 | Discharge: 2015-11-04 | Disposition: A | Discharge status: Home / Self Care | Payer: Medicare Other | Source: Ambulatory Visit | Attending: Obstetrics and Gynecology | Admitting: Obstetrics and Gynecology

## 2015-11-04 ENCOUNTER — Encounter (HOSPITAL_COMMUNITY): Payer: Self-pay

## 2015-11-04 DIAGNOSIS — Z01818 Encounter for other preprocedural examination: Secondary | ICD-10-CM | POA: Insufficient documentation

## 2015-11-04 HISTORY — DX: Anxiety disorder, unspecified: F41.9

## 2015-11-04 HISTORY — DX: Type 2 diabetes mellitus with diabetic neuropathy, unspecified: E11.40

## 2015-11-04 HISTORY — DX: Depression, unspecified: F32.A

## 2015-11-04 HISTORY — DX: Pneumonia, unspecified organism: J18.9

## 2015-11-04 HISTORY — DX: Major depressive disorder, single episode, unspecified: F32.9

## 2015-11-04 LAB — HEPATITIS C ANTIBODY: HCV Ab: NEGATIVE

## 2015-11-04 NOTE — Patient Instructions (Addendum)
Your procedure is scheduled on:  Monday, November 08, 2015  Enter through the Main Entrance of [facility] at:  10:45 AM  Pick up the phone at the desk and dial [phone].  Call this number if you have problems the morning of surgery: [phone].  Remember: Do NOT eat food:  After Midnight Sunday  Do NOT drink clear liquids after:  8:00 AM day of surgery  Take these medicines the morning of surgery with a SIP OF WATER:  Valsartan, Dexilant, Gabapentin, Valtrex,  Bring Asthma Inhaler day of surgery  Do NOT wear jewelry (body piercing), metal hair clips/bobby pins, make-up, or nail polish. Do NOT wear lotions, powders, or perfumes.  You may wear deodorant. Do NOT shave for 48 hours prior to surgery. Do NOT bring valuables to the hospital. Contacts, dentures, or bridgework may not be worn into surgery.  Have a responsible adult drive you home and stay with you for 24 hours after your procedure

## 2015-11-05 ENCOUNTER — Telehealth: Payer: Self-pay | Admitting: Endocrinology

## 2015-11-05 NOTE — Telephone Encounter (Signed)
I contacted the pt and advised of note below. Pt voiced understanding.  

## 2015-11-05 NOTE — Telephone Encounter (Signed)
Please skip a day, then re-try

## 2015-11-05 NOTE — Telephone Encounter (Signed)
Pt has severe stomach pain since taking the antibiotic that was called in yesterday

## 2015-11-08 ENCOUNTER — Encounter (HOSPITAL_COMMUNITY)
Admission: RE | Disposition: A | Discharge status: Home / Self Care | Payer: Self-pay | Source: Ambulatory Visit | Attending: Obstetrics and Gynecology

## 2015-11-08 ENCOUNTER — Ambulatory Visit (HOSPITAL_COMMUNITY): Payer: Medicare Other | Admitting: Anesthesiology

## 2015-11-08 ENCOUNTER — Ambulatory Visit: Admit: 2015-11-08 | Payer: Self-pay | Admitting: Obstetrics and Gynecology

## 2015-11-08 ENCOUNTER — Ambulatory Visit (HOSPITAL_COMMUNITY)
Admission: RE | Admit: 2015-11-08 | Discharge: 2015-11-08 | Disposition: A | Discharge status: Home / Self Care | Payer: Medicare Other | Source: Ambulatory Visit | Attending: Obstetrics and Gynecology | Admitting: Obstetrics and Gynecology

## 2015-11-08 ENCOUNTER — Encounter (HOSPITAL_COMMUNITY): Payer: Self-pay | Admitting: *Deleted

## 2015-11-08 DIAGNOSIS — I10 Essential (primary) hypertension: Secondary | ICD-10-CM | POA: Insufficient documentation

## 2015-11-08 DIAGNOSIS — Z808 Family history of malignant neoplasm of other organs or systems: Secondary | ICD-10-CM | POA: Insufficient documentation

## 2015-11-08 DIAGNOSIS — D649 Anemia, unspecified: Secondary | ICD-10-CM | POA: Insufficient documentation

## 2015-11-08 DIAGNOSIS — Z794 Long term (current) use of insulin: Secondary | ICD-10-CM | POA: Diagnosis not present

## 2015-11-08 DIAGNOSIS — Z6841 Body Mass Index (BMI) 40.0 and over, adult: Secondary | ICD-10-CM | POA: Insufficient documentation

## 2015-11-08 DIAGNOSIS — E1140 Type 2 diabetes mellitus with diabetic neuropathy, unspecified: Secondary | ICD-10-CM | POA: Diagnosis not present

## 2015-11-08 DIAGNOSIS — K3184 Gastroparesis: Secondary | ICD-10-CM

## 2015-11-08 DIAGNOSIS — K589 Irritable bowel syndrome without diarrhea: Secondary | ICD-10-CM | POA: Diagnosis not present

## 2015-11-08 DIAGNOSIS — K219 Gastro-esophageal reflux disease without esophagitis: Secondary | ICD-10-CM | POA: Insufficient documentation

## 2015-11-08 DIAGNOSIS — N8500 Endometrial hyperplasia, unspecified: Secondary | ICD-10-CM | POA: Diagnosis not present

## 2015-11-08 DIAGNOSIS — R0600 Dyspnea, unspecified: Secondary | ICD-10-CM | POA: Diagnosis not present

## 2015-11-08 DIAGNOSIS — R938 Abnormal findings on diagnostic imaging of other specified body structures: Secondary | ICD-10-CM | POA: Diagnosis not present

## 2015-11-08 HISTORY — PX: HYSTEROSCOPY WITH D & C: SHX1775

## 2015-11-08 LAB — GLUCOSE, CAPILLARY
Glucose-Capillary: 140 mg/dL — ABNORMAL HIGH (ref 65–99)
Glucose-Capillary: 119 mg/dL — ABNORMAL HIGH (ref 65–99)

## 2015-11-08 SURGERY — DILATATION AND CURETTAGE /HYSTEROSCOPY
Anesthesia: General | Site: Vagina

## 2015-11-08 SURGERY — DILATATION AND CURETTAGE /HYSTEROSCOPY
Anesthesia: Choice

## 2015-11-08 MED ORDER — SCOPOLAMINE 1 MG/3DAYS TD PT72
MEDICATED_PATCH | TRANSDERMAL | Status: AC
  Administered 2015-11-08: 1.5 mg via TRANSDERMAL
  Filled 2015-11-08: qty 1

## 2015-11-08 MED ORDER — SODIUM CHLORIDE 0.9 % IR SOLN
Status: DC | PRN
  Administered 2015-11-08: 3000 mL

## 2015-11-08 MED ORDER — LIDOCAINE HCL 1 % IJ SOLN: INTRAMUSCULAR | Status: DC | PRN

## 2015-11-08 MED ORDER — ONDANSETRON HCL 4 MG/2ML IJ SOLN
INTRAMUSCULAR | Status: DC | PRN
  Administered 2015-11-08: 4 mg via INTRAVENOUS

## 2015-11-08 MED ORDER — FENTANYL CITRATE (PF) 100 MCG/2ML IJ SOLN
INTRAMUSCULAR | Status: AC
  Filled 2015-11-08: qty 2

## 2015-11-08 MED ORDER — MEPERIDINE HCL 25 MG/ML IJ SOLN: 6.2500 mg | INTRAMUSCULAR | Status: DC | PRN

## 2015-11-08 MED ORDER — SCOPOLAMINE 1 MG/3DAYS TD PT72
1.0000 | MEDICATED_PATCH | Freq: Once | TRANSDERMAL | Status: DC
  Administered 2015-11-08: 1.5 mg via TRANSDERMAL

## 2015-11-08 MED ORDER — HYDROCODONE-ACETAMINOPHEN 7.5-325 MG PO TABS: 1.0000 | ORAL_TABLET | Freq: Once | ORAL | Status: DC | PRN

## 2015-11-08 MED ORDER — LIDOCAINE HCL 1 % IJ SOLN
INTRAMUSCULAR | Status: AC
  Filled 2015-11-08: qty 20

## 2015-11-08 MED ORDER — ACETAMINOPHEN 325 MG PO TABS
325.0000 mg | ORAL_TABLET | ORAL | Status: DC | PRN
Start: 2015-11-08 — End: 2015-11-08

## 2015-11-08 MED ORDER — MIDAZOLAM HCL 2 MG/2ML IJ SOLN
INTRAMUSCULAR | Status: DC | PRN
Start: 2015-11-08 — End: 2015-11-08
  Administered 2015-11-08: 1 mg via INTRAVENOUS

## 2015-11-08 MED ORDER — ONDANSETRON HCL 4 MG/2ML IJ SOLN: 4.0000 mg | Freq: Once | INTRAMUSCULAR | Status: DC | PRN

## 2015-11-08 MED ORDER — PROPOFOL 10 MG/ML IV BOLUS
INTRAVENOUS | Status: DC | PRN
  Administered 2015-11-08: 150 mg via INTRAVENOUS

## 2015-11-08 MED ORDER — FENTANYL CITRATE (PF) 100 MCG/2ML IJ SOLN
25.0000 ug | INTRAMUSCULAR | Status: DC | PRN
  Administered 2015-11-08: 25 ug via INTRAVENOUS

## 2015-11-08 MED ORDER — FENTANYL CITRATE (PF) 100 MCG/2ML IJ SOLN
INTRAMUSCULAR | Status: DC | PRN
  Administered 2015-11-08: 50 ug via INTRAVENOUS

## 2015-11-08 MED ORDER — LIDOCAINE HCL (CARDIAC) 20 MG/ML IV SOLN
INTRAVENOUS | Status: DC | PRN
  Administered 2015-11-08: 100 mg via INTRAVENOUS

## 2015-11-08 MED ORDER — ACETAMINOPHEN 160 MG/5ML PO SOLN: 325.0000 mg | ORAL | Status: DC | PRN

## 2015-11-08 MED ORDER — LACTATED RINGERS IV SOLN
INTRAVENOUS | Status: DC
  Administered 2015-11-08: 11:00:00 via INTRAVENOUS

## 2015-11-08 MED ORDER — MIDAZOLAM HCL 2 MG/2ML IJ SOLN
INTRAMUSCULAR | Status: AC
  Filled 2015-11-08: qty 2

## 2015-11-08 MED ORDER — FENTANYL CITRATE (PF) 100 MCG/2ML IJ SOLN
INTRAMUSCULAR | Status: DC
Start: 2015-11-08 — End: 2015-11-08
  Filled 2015-11-08: qty 2

## 2015-11-08 SURGICAL SUPPLY — 18 items
BIPOLAR CUTTING LOOP 21FR (ELECTRODE)
CANISTER SUCT 3000ML (MISCELLANEOUS) ×2 IMPLANT
CATH ROBINSON RED A/P 16FR (CATHETERS) ×2 IMPLANT
CLOTH BEACON ORANGE TIMEOUT ST (SAFETY) ×2 IMPLANT
CONTAINER PREFILL 10% NBF 60ML (FORM) ×4 IMPLANT
ELECT REM PT RETURN 9FT ADLT (ELECTROSURGICAL) ×2
ELECTRODE REM PT RTRN 9FT ADLT (ELECTROSURGICAL) ×1 IMPLANT
GLOVE BIOGEL PI IND STRL 7.0 (GLOVE) ×1 IMPLANT
GLOVE BIOGEL PI INDICATOR 7.0 (GLOVE) ×1
GLOVE ECLIPSE 7.0 STRL STRAW (GLOVE) ×4 IMPLANT
GOWN STRL REUS W/TWL LRG LVL3 (GOWN DISPOSABLE) ×6 IMPLANT
LOOP CUTTING BIPOLAR 21FR (ELECTRODE) IMPLANT
PACK VAGINAL MINOR WOMEN LF (CUSTOM PROCEDURE TRAY) ×2 IMPLANT
PAD OB MATERNITY 4.3X12.25 (PERSONAL CARE ITEMS) ×2 IMPLANT
TOWEL OR 17X24 6PK STRL BLUE (TOWEL DISPOSABLE) ×4 IMPLANT
TUBING AQUILEX INFLOW (TUBING) ×2 IMPLANT
TUBING AQUILEX OUTFLOW (TUBING) ×2 IMPLANT
WATER STERILE IRR 1000ML POUR (IV SOLUTION) ×2 IMPLANT

## 2015-11-08 NOTE — Anesthesia Procedure Notes (Signed)
Procedure Name: LMA Insertion Date/Time: 11/08/2015 12:36 PM Performed by: Hewitt Blade Pre-anesthesia Checklist: Patient identified, Patient being monitored, Emergency Drugs available and Suction available Patient Re-evaluated:Patient Re-evaluated prior to inductionOxygen Delivery Method: Circle system utilized Preoxygenation: Pre-oxygenation with 100% oxygen Intubation Type: IV induction LMA: LMA with gastric port inserted LMA Size: 4.0 Number of attempts: 1 Placement Confirmation: positive ETCO2 and breath sounds checked- equal and bilateral Tube secured with: Tape Dental Injury: Teeth and Oropharynx as per pre-operative assessment

## 2015-11-08 NOTE — H&P (Signed)
Jessica [name] is an 65 y.o. black female who presents for hysteroscopy/D&C for a thicken endometrial lining.She had a recent benign endometrial biopsy. She has a FMHX of uterine cancer. She is having no vaginal bleeding. She had a similar procedure in 2015 for a polyp Chief Complaint: HPI:  Past Medical History  Diagnosis Date  . Asthma     childhood  . Hemorrhoids   . Autonomic neuropathy     diabetic  . Hypertension   . Gastroparesis   . Unspecified gastritis and gastroduodenitis without mention of hemorrhage   . Personal history of colonic polyps 03/2010    hyperplastic  . Chest pain     intermittant left sided chest pain, non-exertional, lasting 1-2 min  . Nasal congestion current    sore throat, ears-on antibiotic  . PONV (postoperative nausea and vomiting)   . Diabetes mellitus     type II, Hemoglobin A1C 9.9 10/05/2011  . GERD (gastroesophageal reflux disease)   . Diverticulosis   . IBS (irritable bowel syndrome)   . Fatty liver   . Celiac disease   . Cataract   . Heart murmur   . Shortness of breath dyspnea   . Diabetic neuropathy ([place])   . Narrowing of airway   . Pneumonia 11/03/2015    current  . Anxiety   . Depression   . Arthritis     bilateral knees  . Anemia     borderline    Past Surgical History  Procedure Laterality Date  . Tubal ligation    . Appendectomy    . Left breast cyst removal  2000  . Dilatation & currettage/hysteroscopy with resectocope N/A 03/24/2013    Procedure: DILATATION & CURETTAGE, HYSTEROSCOPY WITH RESECTION;  Surgeon: Olga Millers, MD;  Location: [facility];  Service: Gynecology;  Laterality: N/A;  . Colonoscopy      Family History  Problem Relation Age of Onset  . Colon cancer Sister   . Diabetes Sister   . Diabetes Brother   . Heart disease Father   . Other Father     2 collapsed lungs   Social History:  reports that she has never smoked. She has never used smokeless tobacco. She reports that she does not drink alcohol  or use illicit drugs.  Allergies:  Allergies  Allergen Reactions  . Nsaids Nausea And Vomiting    Cannot tolerate large doses     Medications Prior to Admission  Medication Sig Dispense Refill  . atorvastatin (LIPITOR) 40 MG tablet TAKE 1 TABLET BY MOUTH EVERY DAY 30 tablet 1  . B-D INS SYR ULTRAFINE 1CC/31G 31G X 5/16" 1 ML MISC USES UP TO 3 A DAY 100 each 2  . cefUROXime (CEFTIN) 250 MG tablet Take 1 tablet (250 mg total) by mouth 2 (two) times daily. 14 tablet 0  . clidinium-chlordiazePOXIDE (LIBRAX) 5-2.5 MG per capsule Take 1 capsule by mouth 3 (three) times daily as needed. 100 capsule 6  . DEXILANT 60 MG capsule TAKE 1 CAPSULE (60 MG TOTAL) BY MOUTH DAILY. 30 capsule 3  . gabapentin (NEURONTIN) 300 MG capsule TAKE 1 CAPSULE BY MOUTH 3 TIMES DAILY 180 capsule 2  . glucose blood (ONE TOUCH ULTRA TEST) test strip USE TO TEST BLOOD SUGAR 2 TIMES DAILY. DX: E11.43 100 each 4  . ibuprofen (ADVIL,MOTRIN) 100 MG tablet Take 100 mg by mouth every 6 (six) hours as needed for pain or fever.    . insulin NPH-regular Human (HUMULIN 70/30) (70-30) 100  UNIT/ML injection INJECT 110 UNITS WITH BREAKFAST, AND 50 UNITS WITH THE EVENING MEAL 60 mL 5  . polyethylene glycol powder (GLYCOLAX/MIRALAX) powder TAKE 1 CAPFUL (17 GRAMS) DISSOLVED IN AT LEAST 8 OUNCES WATER/JUICE AND DRINK EVERY NIGHT. 527 g 1  . ranitidine (ZANTAC) 150 MG tablet TAKE 1 TABLET (150 MG TOTAL) BY MOUTH AT BEDTIME AS NEEDED FOR HEARTBURN. 30 tablet 2  . valACYclovir (VALTREX) 500 MG tablet TAKE 1 TABLET BY MOUTH EVERY DAY 60 tablet 3  . valsartan (DIOVAN) 320 MG tablet TAKE 1 TABLET BY MOUTH EVERY DAY 30 tablet 2  . Vitamin D, Ergocalciferol, (DRISDOL) 50000 units CAPS capsule Take 1 capsule (50,000 Units total) by mouth every 7 (seven) days. 12 capsule 0  . Fluticasone-Salmeterol (ADVAIR) 100-50 MCG/DOSE AEPB Inhale 1 puff into the lungs 2 (two) times daily. 1 each 3  . gabapentin (NEURONTIN) 300 MG capsule TAKE 1 CAPSULE BY MOUTH  3 TIMES DAILY 270 capsule 1  . hyoscyamine (LEVSIN SL) 0.125 MG SL tablet PLACE 1 TABLET UNDER TONGUE EVERY 4 HOURS AS NEEDED 30 tablet 1  . ketoconazole (NIZORAL) 2 % cream APPLY TO AFFECTED AREA EVERY DAY 30 g 2  . tiZANidine (ZANAFLEX) 4 MG tablet Take 1 tablet (4 mg total) by mouth Nightly. 30 tablet 2      Blood pressure 186/98, pulse 69, temperature 98.1 F (36.7 C), temperature source Oral, resp. rate 16, SpO2 98 %. General appearance: alert Lungs: clear to auscultation bilaterally Breasts: normal appearance, no masses or tenderness Abdomen: soft, non-tender; bowel sounds normal; no masses,  no organomegaly   Lab Results  Component Value Date   WBC 4.4 11/03/2015   HGB 13.7 11/03/2015   HCT 40.7 11/03/2015   MCV 95.8 11/03/2015   PLT 240.0 11/03/2015   No results found for: PREGTESTUR, PREGSERUM, HCG, HCGQUANT    Patient Active Problem List   Diagnosis Date Noted  . Wellness examination 11/03/2015  . Dyspnea 11/03/2015  . Other nonspecific abnormal finding of lung field 11/03/2015  . Degenerative arthritis of knee, bilateral 10/29/2015  . Left shoulder pain 08/02/2015  . Cervical disc disorder with radiculopathy of cervical region 08/02/2015  . Degenerative cervical disc 07/27/2015  . Screening examination for infectious disease 12/19/2013  . Primary localized osteoarthrosis, lower leg 09/05/2013  . [place] 06/07/2013  . Menopausal state 05/21/2013  . Nonallopathic lesion of lumbar region 02/20/2013  . Nonallopathic lesion of sacral region 02/20/2013  . Nonallopathic lesion of thoracic region 02/20/2013  . Low back pain 12/20/2012  . Encounter for long-term (current) use of other medications 04/04/2012  . Dysuria 01/04/2012  . Chest pain 10/05/2011  . Rash 02/28/2011  . OSA (obstructive sleep apnea) 02/28/2011  . Constipation, slow transit 09/23/2010  . Dyspepsia and other specified disorders of function of stomach 09/23/2010  . DM (diabetes mellitus) ([place])  09/23/2010  . FH: colon cancer 09/23/2010  . Personal history of colonic polyps 09/23/2010  . Plantar fasciitis 08/17/2010  . Acute upper respiratory infections of unspecified site 07/22/2010  . SINUSITIS, ACUTE 05/28/2010  . EDEMA 12/20/2009  . SHOULDER DISLOCATION 12/20/2009  . NUMBNESS 10/07/2009  . OTITIS EXTERNA, ACUTE 04/01/2009  . HYPERCHOLESTEROLEMIA 01/29/2009  . CHEST PAIN UNSPECIFIED 01/14/2009  . ABDOMINAL PAIN 01/14/2009  . CONSTIPATION 11/05/2008  . LEUKOPENIA, CHRONIC 04/01/2008  . ASYMPTOMATIC POSTMENOPAUSAL STATUS 04/01/2008  . SKIN DISORDER 12/26/2007  . CRAMP IN LIMB 09/26/2007  . NECK MASS 09/26/2007  . Allergic rhinitis 06/21/2007  . LEG PAIN, BILATERAL 06/21/2007  .  GASTRITIS 02/27/2007  . GASTROPARESIS 02/27/2007  . FOREIGN BODY IN DIGESTIVE SYSTEM UNSPECIFIED 02/27/2007  . Diabetes ([place]) 10/22/2006  . AUTONOMIC NEUROPATHY, DIABETIC 10/22/2006  . ANEMIA-IRON DEFICIENCY 10/22/2006  . Essential hypertension 10/22/2006  . HEMORRHOIDS 10/22/2006  . ASTHMA, CHILDHOOD 10/22/2006   IMP/ Thickened endometrium in a post menopausal female Plan/Hysteroscopy/D&C. ANDERSON,MARK E 11/08/2015, 11:28 AM

## 2015-11-08 NOTE — Transfer of Care (Signed)
Immediate Anesthesia Transfer of Care Note  Patient: Jessica [name]  Procedure(s) Performed: Procedure(s): DILATATION AND CURETTAGE /HYSTEROSCOPY (N/A)  Patient Location: PACU  Anesthesia Type:General  Level of Consciousness: awake, alert  and oriented  Airway & Oxygen Therapy: Patient Spontanous Breathing and Patient connected to nasal cannula oxygen  Post-op Assessment: Report given to RN, Post -op Vital signs reviewed and stable and Patient moving all extremities  Post vital signs: Reviewed and stable  Last Vitals:  Filed Vitals:   11/08/15 1050  BP: 186/98  Pulse: 69  Temp: 36.7 C  Resp: 16    Last Pain: There were no vitals filed for this visit.    Patients Stated Pain Goal: 3 (50/03/70 4888)  Complications: No apparent anesthesia complications

## 2015-11-08 NOTE — Anesthesia Preprocedure Evaluation (Addendum)
Anesthesia Evaluation  Patient identified by MRN, date of birth, ID band Patient awake    Reviewed: Allergy & Precautions, H&P , NPO status , Patient's Chart, lab work & pertinent test results  Airway Mallampati: II  TM Distance: >3 FB Neck ROM: full    Dental no notable dental hx.    Pulmonary neg pulmonary ROS,  Does not use CPAP. Has a pocket of pneumonia for which she is currently taking abx.   Pulmonary exam normal        Cardiovascular Exercise Tolerance: Good hypertension, Pt. on medications Normal cardiovascular exam     Neuro/Psych negative neurological ROS     GI/Hepatic Neg liver ROS,   Endo/Other  negative endocrine ROSdiabetes, Well Controlled, Type 2, Insulin DependentMorbid obesity  Renal/GU negative Renal ROS  negative genitourinary   Musculoskeletal   Abdominal (+) + obese,   Peds  Hematology negative hematology ROS (+)   Anesthesia Other Findings   Reproductive/Obstetrics negative OB ROS                            Anesthesia Physical Anesthesia Plan  ASA: III  Anesthesia Plan: General   Post-op Pain Management:    Induction:   Airway Management Planned: LMA  Additional Equipment:   Intra-op Plan:   Post-operative Plan:   Informed Consent: I have reviewed the patients History and Physical, chart, labs and discussed the procedure including the risks, benefits and alternatives for the proposed anesthesia with the patient or authorized representative who has indicated his/her understanding and acceptance.     Plan Discussed with: CRNA, Anesthesiologist and Surgeon  Anesthesia Plan Comments: (Proseal LMA)        Anesthesia Quick Evaluation

## 2015-11-08 NOTE — Discharge Instructions (Signed)

## 2015-11-08 NOTE — Anesthesia Postprocedure Evaluation (Signed)
Anesthesia Post Note  Patient: Jessica [name]  Procedure(s) Performed: Procedure(s) (LRB): DILATATION AND CURETTAGE /HYSTEROSCOPY (N/A)  Patient location during evaluation: PACU Anesthesia Type: General Level of consciousness: awake and alert Pain management: pain level controlled Vital Signs Assessment: post-procedure vital signs reviewed and stable Respiratory status: spontaneous breathing, nonlabored ventilation, respiratory function stable and patient connected to nasal cannula oxygen Cardiovascular status: blood pressure returned to baseline and stable Postop Assessment: no signs of nausea or vomiting Anesthetic complications: no     Last Vitals:  Filed Vitals:   11/08/15 1330 11/08/15 1345  BP: 185/88 176/77  Pulse: 62 55  Temp:    Resp: 11 9    Last Pain: There were no vitals filed for this visit. Pain Goal: Patients Stated Pain Goal: 3 (11/08/15 1050)               [name]

## 2015-11-09 NOTE — Op Note (Signed)
Jessica [name], Jessica [name]               ACCOUNT NO.:  [id number]  MEDICAL RECORD NO.:  [id number]  LOCATION:                                 FACILITY:  PHYSICIAN:  Freda Munro, M.D.    DATE OF BIRTH:  [birth year]  DATE OF PROCEDURE:  11/08/2015 DATE OF DISCHARGE:                              OPERATIVE REPORT   PREOPERATIVE DIAGNOSIS:  Abnormal endometrium in a postmenopausal woman.  POSTOPERATIVE DIAGNOSIS:  Abnormal endometrium in a postmenopausal woman.  PROCEDURE: 1. Hysteroscopy. 2. Dilation and curettage.  SURGEON:  Freda Munro, M.D.  ANESTHESIA:  General.  ANTIBIOTICS:  None.  ESTIMATED BLOOD LOSS:  Minimal.  SPECIMENS:  Endometrial curettings sent to pathology.  COMPLICATIONS:  None.  FINDINGS:  The patient had a normal endocervical canal.  Her endometrium appeared to be thin and atrophic.  The ostia were normal bilaterally. There was no evidence of any polyps or submucous fibroids.  DESCRIPTION OF PROCEDURE:  The patient was taken to the operating room, where general anesthetic was administered without difficulty.  She was then placed in dorsal lithotomy position.  She was prepped and draped in the usual fashion for this procedure.  Her bladder was drained with a red rubber catheter.  A sterile speculum was placed in the vagina.  A single-tooth tenaculum was applied to the anterior cervical lip.  The cervix was serially dilated to a 27-French.  The hysteroscope was advanced through the endocervical canal which appeared to be normal.  On viewing the endometrial cavity, there was no evidence of thickened endometrium or polyps.  Ostia were both easily seen.  There were no submucous fibroids.  At this point, sharp curettage was performed with minimal amount of tissue being obtained.  The tissue was sent to pathology. This concluded the procedure.  The patient was taken to the recovery room in stable condition.  Instrument and lap count was correct x2.   The patient was discharged to home.  She will follow up in the office in 1 month.    ______________________________ Freda Munro, M.D.   ______________________________ Freda Munro, M.D.    MA/MEDQ  D:  11/08/2015  T:  11/09/2015  Job:  [id number]

## 2015-11-10 ENCOUNTER — Ambulatory Visit: Payer: Medicare Other | Attending: Family Medicine | Admitting: Physical Therapy

## 2015-11-10 DIAGNOSIS — M25562 Pain in left knee: Secondary | ICD-10-CM | POA: Diagnosis not present

## 2015-11-10 DIAGNOSIS — M25561 Pain in right knee: Secondary | ICD-10-CM | POA: Insufficient documentation

## 2015-11-10 DIAGNOSIS — M6281 Muscle weakness (generalized): Secondary | ICD-10-CM | POA: Diagnosis not present

## 2015-11-10 DIAGNOSIS — R262 Difficulty in walking, not elsewhere classified: Secondary | ICD-10-CM

## 2015-11-10 NOTE — Therapy (Addendum)
[place], [place], [address] Phone: [phone]   Fax:  [phone]  Physical Therapy Evaluation/Discharge  Patient Details  Name: Jessica [name] Froman MRN: [id number] Date of Birth: 07/17/1950 Referring Provider: Dr. Hulan Saas   Encounter Date: 11/10/2015      PT End of Session - 11/10/15 1130    Visit Number 1   Number of Visits 16   Date for PT Re-Evaluation 01/05/16   PT Start Time 1102   PT Stop Time 1145   PT Time Calculation (min) 43 min   Activity Tolerance Patient tolerated treatment well   Behavior During Therapy [facility] for tasks assessed/performed      Past Medical History  Diagnosis Date  . Asthma     childhood  . Hemorrhoids   . Autonomic neuropathy     diabetic  . Hypertension   . Gastroparesis   . Unspecified gastritis and gastroduodenitis without mention of hemorrhage   . Personal history of colonic polyps 03/2010    hyperplastic  . Chest pain     intermittant left sided chest pain, non-exertional, lasting 1-2 min  . Nasal congestion current    sore throat, ears-on antibiotic  . PONV (postoperative nausea and vomiting)   . Diabetes mellitus     type II, Hemoglobin A1C 9.9 10/05/2011  . GERD (gastroesophageal reflux disease)   . Diverticulosis   . IBS (irritable bowel syndrome)   . Fatty liver   . Celiac disease   . Cataract   . Heart murmur   . Shortness of breath dyspnea   . Diabetic neuropathy ([place])   . Narrowing of airway   . Pneumonia 11/03/2015    current  . Anxiety   . Depression   . Arthritis     bilateral knees  . Anemia     borderline    Past Surgical History  Procedure Laterality Date  . Tubal ligation    . Appendectomy    . Left breast cyst removal  2000  . Dilatation & currettage/hysteroscopy with resectocope N/A 03/24/2013    Procedure: DILATATION & CURETTAGE, HYSTEROSCOPY WITH RESECTION;  Surgeon: Olga Millers, MD;  Location: [facility];  Service:  Gynecology;  Laterality: N/A;  . Colonoscopy      There were no vitals filed for this visit.       Subjective Assessment - 11/10/15 1106    Subjective Patient has had chronic knee pain (bilateral) for years, worsening since 2015.  She has had several injections (synvisc?)  in Rt. knee which helped her a great deal.  She has also had cortisone in Rt. knee.  Lt knee has not had cortisone at this point.   She has not had PT for her knees.  She has weakness in Rt. knee.  She struggles with mobility, namely car transfers, walking, stairs and comfort while siting. She provides care for her husband, who is disabled.  She would like to avoid surgery and see if PT can help her have less pain.     Pertinent History neuropathy, DM, multiple medical issues, including recent D and C on 11/08/15, pneumonia 11/03/15.    Limitations Sitting;Lifting;Standing;Walking;House hold activities;Other (comment)  stairs    How long can you sit comfortably? 15 min    How long can you stand comfortably? 5-10 min standing at the sink    How long can you walk comfortably? can be up on her feet and be active for 60 min but she does  not feel pain until later   Diagnostic tests none new    Patient Stated Goals Pt would like to have less pain, avoid surgery.    Currently in Pain? Yes   Pain Score 5    Pain Location Knee   Pain Orientation Right   Pain Descriptors / Indicators Sore   Pain Type Chronic pain   Pain Onset More than a month ago   Pain Frequency Intermittent   Aggravating Factors  walking, standing, sit too long   Pain Relieving Factors ice, cannot take NSAIDS   Multiple Pain Sites Yes   Pain Score 5   Pain Location Knee   Pain Orientation Left   Pain Type Chronic pain   Pain Onset More than a month ago   Pain Frequency Intermittent   Aggravating Factors  walking, weightbearing    Pain Relieving Factors rest, ice             [facility] PT Assessment - 11/10/15 1114    Assessment   Medical Diagnosis L  knee pain    Referring Provider Dr. 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   Cognition   Overall Cognitive Status Within Functional Limits for tasks assessed   Observation/Other Assessments   Focus on Therapeutic Outcomes (FOTO)  52%   Sensation   Light Touch Appears Intact   Coordination   Gross Motor Movements are Fluid and Coordinated Not tested   Posture/Postural Control   Posture/Postural Control Postural limitations   Posture Comments genu valgus L >R    AROM   Right Knee Extension 13   Right Knee Flexion 112   Left Knee Extension 10   Left Knee Flexion 110   Strength   Right Hip Flexion 3+/5   Right Hip ABduction 3-/5   Left Hip Flexion 3+/5   Left Hip ABduction 3-/5   Right Knee Flexion 3+/5   Right Knee Extension 4+/5   Left Knee Flexion 4+/5   Left Knee Extension 4+/5   Palpation   Patella mobility hypomobile L knee more than Rt.    Palpation comment tenderness only                    [facility] Adult PT Treatment/Exercise - 11/10/15 1114    Self-Care   Self-Care RICE;Posture;Heat/Ice Application;Other Self-Care Comments   RICE ice application    Posture knee alignment    Other Self-Care Comments  prognosis, potential to improve condition, strengthening to support knee, HEP    Knee/Hip  Exercises: Seated   Long Arc Quad Strengthening;Both;1 set   [employer] Limitations HEP   Heel Slides AAROM;Strengthening;Both;1 set   Heel Slides Limitations HEP   Marching Strengthening;Both;1 set  Marching Limitations HEP   Knee/Hip Exercises: Supine   Quad Sets Strengthening;Both;1 set   [employer] Limitations HEP   Straight Leg Raises Strengthening;Both;1 set   Straight Leg Raises Limitations min A for LLE                 PT Education - 11/10/15 1316    Education provided Yes   Education Details see self care, PT/POC and HEP    Person(s) Educated Patient   Methods Explanation;Demonstration;Handout   Comprehension Verbalized understanding;Returned demonstration;Need further instruction          PT Short Term Goals - 11/10/15 1324    PT SHORT TERM GOAL #1   Title Pt will be I with initial HEP for LE strength and flexibility.    Time 4   Period Weeks   Status New   PT SHORT TERM GOAL #2   Title Pt will be able to sit for 15-20 min and report less stiffness overall for rest breaks during housework.    Time 4   Period Weeks   Status New   PT SHORT TERM GOAL #3   Title Pt will be able to perform usual activities in her home (housework, ADLs) with pain </=4/10   Time 4   Period Weeks   Status New           PT Long Term Goals - 11/10/15 1326    PT LONG TERM GOAL #1   Title Pt will be I with more advanced HEP   Time 8   Period Weeks   Status New   PT LONG TERM GOAL #2   Title Pt will be able to do stairs in her home for ther ex and report pain <5/10 in knees.    Time 8   Period Weeks   Status New   PT LONG TERM GOAL #3   Title Pt will be able to walk for 30 min in grocery store and report 25% less pain overall..    Time 8   Period Weeks   Status New   PT LONG TERM GOAL #4   Title Pt will score FOTO 43% or less limited in functional mobilty.    Time 8   Period Weeks   Status New             G-Codes - 2015-12-16 1553    Functional  Assessment Tool Used FOTO   Functional Limitation Mobility: Walking and moving around   Mobility: Walking and Moving Around Current Status [phone]) At least 40 percent but less than 60 percent impaired, limited or restricted   Mobility: Walking and Moving Around Goal Status [phone]) At least 40 percent but less than 60 percent impaired, limited or restricted   Mobility: Walking and Moving Around Discharge Status [phone]) At least 40 percent but less than 60 percent impaired, limited or restricted            Plan - 11/10/15 1317    Clinical Impression Statement Pt presents for low complexity eval (stable) for knee pain.  She reports bilateral knee pain (can be intermittent and alternating) for the past 2-3 yrs.  She has min swelling, weakness (hips, knees), decreased flexibility in both knees.  She has been a caregiver for the past few years, needs to be able to manage her household and care for herself and at times provide physical help to her husband.  She should improve but may need to do aquatic exercise to reduce pain in joints  for max benefit.  We will address bilateral knees for continuity.    Rehab Potential Good   PT Frequency 2x / week   PT Duration 8 weeks   PT Treatment/Interventions ADLs/Self Care Home Management;Ultrasound;Balance training;Neuromuscular re-education;Passive range of motion;Patient/family education;Gait training;Stair training;Cryotherapy;Electrical Stimulation;Functional mobility training;Taping;Therapeutic activities;Iontophoresis 48m/ml Dexamethasone;Moist Heat;Therapeutic exercise;Manual techniques   PT Next Visit Plan check full HEP and try IFC for pain, with ice.    PT Home Exercise Plan level 1 knee   Consulted and Agree with Plan of Care Patient      Patient will benefit from skilled therapeutic intervention in order to improve the following deficits and impairments:  Difficulty walking, Decreased range of motion, Increased fascial restricitons, Obesity,  Decreased activity tolerance, Pain, Improper body mechanics, Impaired flexibility, Hypomobility, Decreased balance, Decreased mobility, Decreased strength, Increased edema, Postural dysfunction, Impaired sensation  Visit Diagnosis: Pain in left knee  Pain in right knee  Difficulty in walking involving lower leg joint  Muscle weakness (generalized)     Problem List Patient Active Problem List   Diagnosis Date Noted  . Wellness examination 11/03/2015  . Dyspnea 11/03/2015  . Other nonspecific abnormal finding of lung field 11/03/2015  . Degenerative arthritis of knee, bilateral 10/29/2015  . Left shoulder pain 08/02/2015  . Cervical disc disorder with radiculopathy of cervical region 08/02/2015  . Degenerative cervical disc 07/27/2015  . Screening examination for infectious disease 12/19/2013  . Primary localized osteoarthrosis, lower leg 09/05/2013  . Comedone 06/07/2013  . Menopausal state 05/21/2013  . Nonallopathic lesion of lumbar region 02/20/2013  . Nonallopathic lesion of sacral region 02/20/2013  . Nonallopathic lesion of thoracic region 02/20/2013  . Low back pain 12/20/2012  . Encounter for long-term (current) use of other medications 04/04/2012  . Dysuria 01/04/2012  . Chest pain 10/05/2011  . Rash 02/28/2011  . OSA (obstructive sleep apnea) 02/28/2011  . Constipation, slow transit 09/23/2010  . Dyspepsia and other specified disorders of function of stomach 09/23/2010  . DM (diabetes mellitus) ([place] 09/23/2010  . FH: colon cancer 09/23/2010  . Personal history of colonic polyps 09/23/2010  . Plantar fasciitis 08/17/2010  . Acute upper respiratory infections of unspecified site 07/22/2010  . SINUSITIS, ACUTE 05/28/2010  . EDEMA 12/20/2009  . SHOULDER DISLOCATION 12/20/2009  . NUMBNESS 10/07/2009  . OTITIS EXTERNA, ACUTE 04/01/2009  . HYPERCHOLESTEROLEMIA 01/29/2009  . CHEST PAIN UNSPECIFIED 01/14/2009  . ABDOMINAL PAIN 01/14/2009  . CONSTIPATION 11/05/2008   . LEUKOPENIA, CHRONIC 04/01/2008  . ASYMPTOMATIC POSTMENOPAUSAL STATUS 04/01/2008  . SKIN DISORDER 12/26/2007  . CRAMP IN LIMB 09/26/2007  . NECK MASS 09/26/2007  . Allergic rhinitis 06/21/2007  . LEG PAIN, BILATERAL 06/21/2007  . GASTRITIS 02/27/2007  . GASTROPARESIS 02/27/2007  . FOREIGN BODY IN DIGESTIVE SYSTEM UNSPECIFIED 02/27/2007  . Diabetes ([place] 10/22/2006  . AUTONOMIC NEUROPATHY, DIABETIC 10/22/2006  . ANEMIA-IRON DEFICIENCY 10/22/2006  . Essential hypertension 10/22/2006  . HEMORRHOIDS 10/22/2006  . ASTHMA, CHILDHOOD 10/22/2006    Camera Krienke 11/10/2015, 3:29 PM  CP[facility]CProm[facility]GGildford Colony N[place] [address]Phone: [phone]  Fax:  [phone] Name: SDeshanta LadyScales MRN: [id number] of Birth: [birth year]  JRaeford Razor PT 11/10/2015 3:29 PM Phone: [phone]: [phone] PHYSICAL THERAPY DISCHARGE SUMMARY  Visits from Start of Care: 1  Current functional level related to goals / functional outcomes: See above    Remaining deficits: See above   Education / Equipment: HEP  Plan: Patient agrees to discharge.  Patient  goals were not met. Patient is being discharged due to the patient's request.  ?????    Going to aquatic therapy.     Raeford Razor, PT 12/06/15 3:55 PM Phone: [phone] Fax: [phone]

## 2015-11-10 NOTE — Patient Instructions (Signed)
  Heel Slide   Bend left knee and pull heel toward buttocks. Use sheet, strap, or rope to actively assist knee further into flexion. Hold stretch 10 secs.  Repeat ___10_ times. Do ___2_ sessions per day.  KNEE: Extension, Long Arc Quads - Sitting   Raise leg until knee is straight. _10__ reps per set, __2_ sets per day, __5-7_ days per week  FLEXION: Sitting (Active)   Sit, both feet flat. Lift right knee toward ceiling. Complete __1-2_ sets of _10-20__ repetitions. Perform __2 sessions per day.  Copyright  VHI. All rights reserved.   Quad Sets    Slowly tighten thigh muscles of straight, left leg while counting out loud to _10___. Relax. Repeat __10__ times. Do __2-3 __ sessions per day.  http://gt2.exer.us/293   Copyright  VHI. All rights reserved.    Hip Flexion / Knee Extension: Straight-Leg Raise (Eccentric)   Lie on back. Lift leg with knee straight. Slowly lower leg for 3-5 seconds. _10_ reps per set, _1-2__ sets per day, _5__ days per week.

## 2015-11-11 ENCOUNTER — Other Ambulatory Visit: Payer: Self-pay | Admitting: Internal Medicine

## 2015-11-11 ENCOUNTER — Encounter (HOSPITAL_COMMUNITY): Payer: Self-pay | Admitting: Obstetrics and Gynecology

## 2015-11-23 ENCOUNTER — Other Ambulatory Visit: Payer: Self-pay | Admitting: Internal Medicine

## 2015-11-23 ENCOUNTER — Encounter: Payer: Medicare Other | Admitting: Physical Therapy

## 2015-11-24 ENCOUNTER — Other Ambulatory Visit: Payer: Self-pay | Admitting: Internal Medicine

## 2015-11-24 ENCOUNTER — Other Ambulatory Visit: Payer: Self-pay | Admitting: Endocrinology

## 2015-11-30 ENCOUNTER — Ambulatory Visit
Admission: RE | Admit: 2015-11-30 | Discharge: 2015-11-30 | Disposition: A | Discharge status: Home / Self Care | Payer: 59 | Source: Ambulatory Visit | Attending: Endocrinology | Admitting: Endocrinology

## 2015-11-30 DIAGNOSIS — R918 Other nonspecific abnormal finding of lung field: Secondary | ICD-10-CM

## 2015-11-30 DIAGNOSIS — R05 Cough: Secondary | ICD-10-CM | POA: Diagnosis not present

## 2015-12-01 ENCOUNTER — Telehealth: Payer: Self-pay

## 2015-12-01 NOTE — Telephone Encounter (Signed)
Called and spoke with patient, gave normal results. No questions or concerns.

## 2015-12-02 ENCOUNTER — Encounter: Payer: Medicare Other | Admitting: Physical Therapy

## 2015-12-02 ENCOUNTER — Ambulatory Visit (INDEPENDENT_AMBULATORY_CARE_PROVIDER_SITE_OTHER): Payer: Medicare Other | Admitting: Family Medicine

## 2015-12-02 ENCOUNTER — Encounter: Payer: Self-pay | Admitting: Family Medicine

## 2015-12-02 DIAGNOSIS — M170 Bilateral primary osteoarthritis of knee: Secondary | ICD-10-CM | POA: Diagnosis not present

## 2015-12-02 NOTE — Assessment & Plan Note (Signed)
Patient was given another injection in both knees. We discussed with patient that she has felt all other conservative therapy including formal physical therapy at this time. We will try aquatic therapy. Hopefully this will be more beneficial. We discussed icing regimen. Patient's was given the choice for custom orthotics and custom bracing which patient declined today. Patient will come back again in 2-3 weeks. If worsening symptoms she would be a candidate for viscous supplementation. We did discuss with her she will need knee replacement at some point but is taking care of her husband who is critically ill.

## 2015-12-02 NOTE — Progress Notes (Signed)
[name] [address], [place] [address] Phone: [phone] Subjective:     CC:  Bilateral knee pain  [id number]  Jessica [name] is a 65 y.o. female coming in with complaint of bilateral knee pain. 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 Last injections one back in March 2017. Patient states the pain is severe at this moment. Patient states that it's has increasing instability. Did start with formal physical therapy but unfortunately feels that aquatic therapy would be beneficial. Patient has been doing more icing though. Patient states that she wears custom shoes that are more helpful. Patient states though that it is affecting daily activities and waking her up at night.      Past Medical History:  Diagnosis Date  . Anemia    borderline  . Anxiety   . Arthritis    bilateral knees  . Asthma    childhood  . Autonomic neuropathy    diabetic  . Cataract   . Celiac disease   . Chest pain    intermittant left sided chest pain, non-exertional, lasting 1-2 min  . Depression   . Diabetes mellitus    type II, Hemoglobin A1C 9.9 10/05/2011  . Diabetic neuropathy ([place])   . Diverticulosis   . Fatty liver   . Gastroparesis   . GERD (gastroesophageal reflux disease)   . Heart murmur   . Hemorrhoids   . Hypertension   . IBS (irritable bowel syndrome)   . Narrowing of airway   . Nasal congestion current   sore throat, ears-on antibiotic  . Personal history of colonic polyps 03/2010   hyperplastic  . Pneumonia 11/03/2015   current  . PONV (postoperative nausea and vomiting)   . Shortness of breath dyspnea   . Unspecified gastritis and gastroduodenitis without mention of hemorrhage    Past Surgical History:  Procedure Laterality Date  . APPENDECTOMY    . COLONOSCOPY    . DILATATION & CURRETTAGE/HYSTEROSCOPY WITH  RESECTOCOPE N/A 03/24/2013   Procedure: DILATATION & CURETTAGE, HYSTEROSCOPY WITH RESECTION;  Surgeon: Olga Millers, MD;  Location: [facility];  Service: Gynecology;  Laterality: N/A;  . HYSTEROSCOPY W/D&C N/A 11/08/2015   Procedure: DILATATION AND CURETTAGE /HYSTEROSCOPY;  Surgeon: Olga Millers, MD;  Location: [facility];  Service: Gynecology;  Laterality: N/A;  . left breast cyst removal  2000  . TUBAL LIGATION     Social History   Social History  . Marital status: Married    Spouse name: Devar Sianez  . Number of children: 2  . 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 Smoking status: Never Smoker  . Smokeless tobacco: Never Used  . Alcohol use No  . Drug use: No  . Sexual activity: Yes    Birth control/ protection: Post-menopausal, Surgical   Other Topics Concern  . Not on file   Social History Narrative  . No narrative on file   Allergies  Allergen Reactions  . Nsaids Nausea And Vomiting    Cannot tolerate large doses    Family History  Problem Relation Age of Onset  . Colon cancer Sister   . Diabetes Sister   . Diabetes Brother   . Heart disease Father   . Other Father     2 collapsed lungs  Past medical history, social, surgical and family history all reviewed in electronic medical record.  No pertanent information unless stated regarding to the chief complaint.   Review of Systems: No headache, visual changes, nausea, vomiting, diarrhea, constipation, dizziness, abdominal pain, skin rash, fevers, chills, night sweats, weight loss, swollen lymph nodes, body aches, joint swelling, muscle aches, chest pain, shortness of breath, mood changes.   Objective  There were no vitals taken for this visit.  General: No apparent distress alert and oriented x3 mood and affect normal, dressed appropriately. obese HEENT: Pupils equal, extraocular movements intact  Respiratory: Patient's speak in full  sentences and does not appear short of breath  Cardiovascular: No lower extremity edema, non tender, no erythema  Skin: Warm dry intact with no signs of infection or rash on extremities or on axial skeleton.  Abdomen: Soft nontender  Neuro: Cranial nerves II through XII are intact, neurovascularly intact in all extremities with 2+ DTRs and 2+ pulses.  Lymph: No lymphadenopathy of posterior or anterior cervical chain or axillae bilaterally.  Gait antalgic gait.  MSK:  Non tender with full range of motion and good stability and symmetric strength and tone of shoulders, elbows, wrist, hip, and ankles bilaterally. Mild arthritic changes of multiple joints Knee:Bilateral Valgus deformity of the knees bilaterally. Patient does have significant calf to thigh ratio Tenderness mostly over the medial joint line bilaterally Lacking the last 5 of extension as well as flexion bilaterally Instability noted with valgus force painful patellar compression. Patellar glide with mildcrepitus. Patellar and quadriceps tendons unremarkable. Hamstring and quadriceps strength is normal.   After informed written and verbal consent, patient was seated on exam table. Right knee was prepped with alcohol swab and utilizing anterolateral approach, patient's right knee space was injected with 4:1  marcaine 0.5%: Kenalog 34m/dL. Patient tolerated the procedure well without immediate complications.  After informed written and verbal consent, patient was seated on exam table. Left knee was prepped with alcohol swab and utilizing anterolateral approach, patient's left knee space was injected with 4:1  marcaine 0.5%: Kenalog 417mdL. Patient tolerated the procedure well without immediate complications.    Impression and Recommendations:     This case required medical decision making of moderate complexity.      Note: This dictation was prepared with Dragon dictation along with smaller phrase technology. Any  transcriptional errors that result from this process are unintentional.

## 2015-12-02 NOTE — Patient Instructions (Addendum)
Am sorry you are hurting.  We needed to do another steroid injection today .  What I would like you to do would be to see me again in 2 weeks and we can start either monovisc or synvisc.  Have a great trip !

## 2015-12-03 ENCOUNTER — Ambulatory Visit: Payer: Medicare Other | Admitting: Family Medicine

## 2015-12-07 ENCOUNTER — Encounter: Payer: Medicare Other | Admitting: Physical Therapy

## 2015-12-09 ENCOUNTER — Other Ambulatory Visit: Payer: Self-pay

## 2015-12-09 ENCOUNTER — Encounter: Payer: Medicare Other | Admitting: Physical Therapy

## 2015-12-09 ENCOUNTER — Other Ambulatory Visit: Payer: Self-pay | Admitting: Endocrinology

## 2015-12-09 MED ORDER — ATORVASTATIN CALCIUM 40 MG PO TABS
40.0000 mg | ORAL_TABLET | Freq: Every day | ORAL | 1 refills | Status: DC
Start: 2015-12-09 — End: 2016-03-13

## 2015-12-10 ENCOUNTER — Ambulatory Visit: Payer: Medicare Other | Admitting: Family Medicine

## 2015-12-14 ENCOUNTER — Encounter: Payer: Medicare Other | Admitting: Physical Therapy

## 2015-12-16 ENCOUNTER — Encounter: Payer: Medicare Other | Admitting: Physical Therapy

## 2015-12-16 ENCOUNTER — Ambulatory Visit (INDEPENDENT_AMBULATORY_CARE_PROVIDER_SITE_OTHER): Payer: Medicare Other | Admitting: Family Medicine

## 2015-12-16 ENCOUNTER — Encounter: Payer: Self-pay | Admitting: Family Medicine

## 2015-12-16 DIAGNOSIS — M170 Bilateral primary osteoarthritis of knee: Secondary | ICD-10-CM

## 2015-12-16 NOTE — Progress Notes (Signed)
[name] [address], [place] [address] Phone: [phone] Subjective:     CC:  Bilateral knee pain Follow-up  [id number]  Jessica [name] is a 65 y.o. female coming in with complaint of bilateral knee pain. 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 Last injections within the last 3 months. Patient states that they did not seem to work as well. Patient is failed all other conservative therapy and has gone to physical therapy as well. Patient states pain is tender 20% better after the injection but still not great. Unable to take anti-inflammatories. States that he can affect daily activities.     Past Medical History:  Diagnosis Date  . Anemia    borderline  . Anxiety   . Arthritis    bilateral knees  . Asthma    childhood  . Autonomic neuropathy    diabetic  . Cataract   . Celiac disease   . Chest pain    intermittant left sided chest pain, non-exertional, lasting 1-2 min  . Depression   . Diabetes mellitus    type II, Hemoglobin A1C 9.9 10/05/2011  . Diabetic neuropathy ([place])   . Diverticulosis   . Fatty liver   . Gastroparesis   . GERD (gastroesophageal reflux disease)   . Heart murmur   . Hemorrhoids   . Hypertension   . IBS (irritable bowel syndrome)   . Narrowing of airway   . Nasal congestion current   sore throat, ears-on antibiotic  . Personal history of colonic polyps 03/2010   hyperplastic  . Pneumonia 11/03/2015   current  . PONV (postoperative nausea and vomiting)   . Shortness of breath dyspnea   . Unspecified gastritis and gastroduodenitis without mention of hemorrhage    Past Surgical History:  Procedure Laterality Date  . APPENDECTOMY    . COLONOSCOPY    . DILATATION & CURRETTAGE/HYSTEROSCOPY WITH RESECTOCOPE N/A 03/24/2013   Procedure: DILATATION & CURETTAGE, HYSTEROSCOPY WITH  RESECTION;  Surgeon: Olga Millers, MD;  Location: [facility];  Service: Gynecology;  Laterality: N/A;  . HYSTEROSCOPY W/D&C N/A 11/08/2015   Procedure: DILATATION AND CURETTAGE /HYSTEROSCOPY;  Surgeon: Olga Millers, MD;  Location: [facility];  Service: Gynecology;  Laterality: N/A;  . left breast cyst removal  2000  . TUBAL LIGATION     Social History   Social History  . Marital status: Married    Spouse name: Devar Wooley  . Number of children: 2  . 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 Smoking status: Never Smoker  . Smokeless tobacco: Never Used  . Alcohol use No  . Drug use: No  . Sexual activity: Yes    Birth control/ protection: Post-menopausal, Surgical   Other Topics Concern  . None   Social History Narrative  . None   Allergies  Allergen Reactions  . Nsaids Nausea And Vomiting    Cannot tolerate large doses    Family History  Problem Relation Age of Onset  . Colon cancer Sister   . Diabetes Sister   . Diabetes Brother   . Heart disease Father   . Other Father     2 collapsed lungs    Past medical history, social, surgical and family history all reviewed in electronic medical record.  No pertanent information  unless stated regarding to the chief complaint.   Review of Systems: No headache, visual changes, nausea, vomiting, diarrhea, constipation, dizziness, abdominal pain, skin rash, fevers, chills, night sweats, weight loss, swollen lymph nodes, body aches, joint swelling, muscle aches, chest pain, shortness of breath, mood changes.   Objective  Blood pressure 132/70, pulse 80, SpO2 98 %.  General: No apparent distress alert and oriented x3 mood and affect normal, dressed appropriately. obese HEENT: Pupils equal, extraocular movements intact  Respiratory: Patient's speak in full sentences and does not appear short of breath  Cardiovascular: No lower extremity edema, non tender, no  erythema  Skin: Warm dry intact with no signs of infection or rash on extremities or on axial skeleton.  Abdomen: Soft nontender  Neuro: Cranial nerves II through XII are intact, neurovascularly intact in all extremities with 2+ DTRs and 2+ pulses.  Lymph: No lymphadenopathy of posterior or anterior cervical chain or axillae bilaterally.  Gait antalgic gait.  MSK:  Non tender with full range of motion and good stability and symmetric strength and tone of shoulders, elbows, wrist, hip, and ankles bilaterally. Mild arthritic changes of multiple joints Knee:Bilateral Valgus deformity of the knees bilaterally. Patient does have significant calf to thigh ratio Tenderness mostly over the medial joint line bilaterally Lacking the last 5 of extension as well as flexion bilaterally Instability noted with valgus force painful patellar compression. Patellar glide with mildcrepitus. Patellar and quadriceps tendons unremarkable. Hamstring and quadriceps strength is normal.   After informed written and verbal consent, patient was seated on exam table. Right knee was prepped with alcohol swab and utilizing anterolateral approach, patient's right knee space was injected with15 mg/2.5 mL of Orthovisc(sodium hyaluronate) in a prefilled syringe was injected easily into the knee through a 22-gauge needle.. Patient tolerated the procedure well without immediate complications.  After informed written and verbal consent, patient was seated on exam table. Left knee was prepped with alcohol swab and utilizing anterolateral approach, patient's left knee space was injected with 15 mg/2.5 mL of Orthovisc(sodium hyaluronate) in a prefilled syringe was injected easily into the knee through a 22-gauge needle... Patient tolerated the procedure well without immediate complications.    Impression and Recommendations:     This case required medical decision making of moderate complexity.      Note: This dictation was  prepared with Dragon dictation along with smaller phrase technology. Any transcriptional errors that result from this process are unintentional.

## 2015-12-16 NOTE — Assessment & Plan Note (Addendum)
Bilateral injections given today for the viscous supplementation after failing all conservative therapy. We discussed icing regimen and we discussed home exercises rtc in 2 weeks for 2nd in series of 4  Declined custom brace

## 2015-12-16 NOTE — Patient Instructions (Signed)
Good to see you  This will be a bit of a process Continue everything else you are doing  May not notice a ton of improvement immediately.  See you again next week for #2 bilaterally.

## 2015-12-21 ENCOUNTER — Encounter: Payer: Medicare Other | Admitting: Physical Therapy

## 2015-12-23 ENCOUNTER — Encounter: Payer: Medicare Other | Admitting: Physical Therapy

## 2015-12-25 ENCOUNTER — Other Ambulatory Visit: Payer: Self-pay | Admitting: Internal Medicine

## 2015-12-28 NOTE — Assessment & Plan Note (Signed)
Doing well 2 down and 2 to go.  Overall continue conservative therapy.  RTC in 1 week for 3 out of 4.

## 2015-12-28 NOTE — Progress Notes (Signed)
[name] [address] [place], [place] [address] Phone: [phone] Subjective:     CC:  Bilateral knee pain Follow-up  [id number]  Jessica [name] is a 65 y.o. female coming in with complaint of bilateral knee pain. 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 Started Orthovisc after failing conservative therapy.  States doing fine. Here for 2 out of 4. States that the pain is improved. Mild back pain but nothing severe.    Past Medical History:  Diagnosis Date  . Anemia    borderline  . Anxiety   . Arthritis    bilateral knees  . Asthma    childhood  . Autonomic neuropathy    diabetic  . Cataract   . Celiac disease   . Chest pain    intermittant left sided chest pain, non-exertional, lasting 1-2 min  . Depression   . Diabetes mellitus    type II, Hemoglobin A1C 9.9 10/05/2011  . Diabetic neuropathy ([place])   . Diverticulosis   . Fatty liver   . Gastroparesis   . GERD (gastroesophageal reflux disease)   . Heart murmur   . Hemorrhoids   . Hypertension   . IBS (irritable bowel syndrome)   . Narrowing of airway   . Nasal congestion current   sore throat, ears-on antibiotic  . Personal history of colonic polyps 03/2010   hyperplastic  . Pneumonia 11/03/2015   current  . PONV (postoperative nausea and vomiting)   . Shortness of breath dyspnea   . Unspecified gastritis and gastroduodenitis without mention of hemorrhage    Past Surgical History:  Procedure Laterality Date  . APPENDECTOMY    . COLONOSCOPY    . DILATATION & CURRETTAGE/HYSTEROSCOPY WITH RESECTOCOPE N/A 03/24/2013   Procedure: DILATATION & CURETTAGE, HYSTEROSCOPY WITH RESECTION;  Surgeon: Olga Millers, MD;  Location: [facility];  Service: Gynecology;  Laterality: N/A;  . HYSTEROSCOPY W/D&C N/A 11/08/2015   Procedure: DILATATION AND CURETTAGE /HYSTEROSCOPY;   Surgeon: Olga Millers, MD;  Location: [facility];  Service: Gynecology;  Laterality: N/A;  . left breast cyst removal  2000  . TUBAL LIGATION     Social History   Social History  . Marital status: Married    Spouse name: Devar Bick  . Number of children: 2  . 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 Smoking status: Never Smoker  . Smokeless tobacco: Never Used  . Alcohol use No  . Drug use: No  . Sexual activity: Yes    Birth control/ protection: Post-menopausal, Surgical   Other Topics Concern  . Not on file   Social History Narrative  . No narrative on file   Allergies  Allergen Reactions  . Nsaids Nausea And Vomiting    Cannot tolerate large doses    Family History  Problem Relation Age of Onset  . Colon cancer Sister   . Diabetes Sister   . Diabetes Brother   . Heart disease Father   . Other Father     2 collapsed lungs    Past medical history, social, surgical and family history all reviewed in electronic medical record.  No pertanent information unless stated regarding to the chief complaint.   Review of Systems: No headache, visual changes, nausea, vomiting, diarrhea, constipation, dizziness, abdominal pain, skin rash, fevers,  chills, night sweats, weight loss, swollen lymph nodes, body aches, joint swelling, muscle aches, chest pain, shortness of breath, mood changes.   Objective  There were no vitals taken for this visit.  General: No apparent distress alert and oriented x3 mood and affect normal, dressed appropriately. obese HEENT: Pupils equal, extraocular movements intact  Respiratory: Patient's speak in full sentences and does not appear short of breath  Cardiovascular: No lower extremity edema, non tender, no erythema  Skin: Warm dry intact with no signs of infection or rash on extremities or on axial skeleton.  Abdomen: Soft nontender  Neuro: Cranial nerves II through XII  are intact, neurovascularly intact in all extremities with 2+ DTRs and 2+ pulses.  Lymph: No lymphadenopathy of posterior or anterior cervical chain or axillae bilaterally.  Gait antalgic gait.  MSK:  Non tender with full range of motion and good stability and symmetric strength and tone of shoulders, elbows, wrist, hip, and ankles bilaterally. Mild arthritic changes of multiple joints Knee:Bilateral Valgus deformity of the knees bilaterally. Patient does have significant calf to thigh ratio Tenderness mostly over the medial joint line bilaterally Lacking the last 5 of extension as well as flexion bilaterally Instability noted with valgus force painful patellar compression. Patellar glide with mildcrepitus. Patellar and quadriceps tendons unremarkable. Hamstring and quadriceps strength is normal.  No significant change from presents exam  After informed written and verbal consent, patient was seated on exam table. Right knee was prepped with alcohol swab and utilizing anterolateral approach, patient's right knee space was injected with15 mg/2.5 mL of Orthovisc(sodium hyaluronate) in a prefilled syringe was injected easily into the knee through a 22-gauge needle.. Patient tolerated the procedure well without immediate complications.  After informed written and verbal consent, patient was seated on exam table. Left knee was prepped with alcohol swab and utilizing anterolateral approach, patient's left knee space was injected with 15 mg/2.5 mL of Orthovisc(sodium hyaluronate) in a prefilled syringe was injected easily into the knee through a 22-gauge needle... Patient tolerated the procedure well without immediate complications.    Impression and Recommendations:     This case required medical decision making of moderate complexity.      Note: This dictation was prepared with Dragon dictation along with smaller phrase technology. Any transcriptional errors that result from this process are  unintentional.

## 2015-12-29 ENCOUNTER — Ambulatory Visit (INDEPENDENT_AMBULATORY_CARE_PROVIDER_SITE_OTHER): Payer: Medicare Other | Admitting: Family Medicine

## 2015-12-29 ENCOUNTER — Other Ambulatory Visit: Payer: Self-pay | Admitting: Internal Medicine

## 2015-12-29 ENCOUNTER — Encounter: Payer: Self-pay | Admitting: Family Medicine

## 2015-12-29 DIAGNOSIS — M170 Bilateral primary osteoarthritis of knee: Secondary | ICD-10-CM

## 2015-12-29 NOTE — Patient Instructions (Signed)
Good to see you  #2 down and 2 more to go.  Ice is your friend.  I am hoping the back is because you are dong more actiiv ty and with the knee pain improving then this should help the back in the long run.  See me again in 2 weeks for #3 in both knees.

## 2016-01-04 ENCOUNTER — Telehealth: Payer: Self-pay | Admitting: Endocrinology

## 2016-01-04 DIAGNOSIS — N201 Calculus of ureter: Secondary | ICD-10-CM

## 2016-01-04 NOTE — Telephone Encounter (Signed)
See message and please advise, Thanks!  

## 2016-01-04 NOTE — Telephone Encounter (Signed)
Patient stated her tongue has a coating like it's turning brown and didn't start happening until she had the stomach virus. Please advise

## 2016-01-04 NOTE — Telephone Encounter (Signed)
Patient scheduled for tomorrow at 330 pm.

## 2016-01-04 NOTE — Telephone Encounter (Signed)
Tomorrow 3:30 PM

## 2016-01-05 ENCOUNTER — Encounter: Payer: Self-pay | Admitting: Endocrinology

## 2016-01-05 ENCOUNTER — Ambulatory Visit (INDEPENDENT_AMBULATORY_CARE_PROVIDER_SITE_OTHER): Payer: Medicare Other | Admitting: Endocrinology

## 2016-01-05 VITALS — BP 128/70 | HR 84 | Wt 258.0 lb

## 2016-01-05 DIAGNOSIS — Z794 Long term (current) use of insulin: Secondary | ICD-10-CM

## 2016-01-05 DIAGNOSIS — E1143 Type 2 diabetes mellitus with diabetic autonomic (poly)neuropathy: Secondary | ICD-10-CM

## 2016-01-05 DIAGNOSIS — J309 Allergic rhinitis, unspecified: Secondary | ICD-10-CM

## 2016-01-05 LAB — POCT GLYCOSYLATED HEMOGLOBIN (HGB A1C): Hemoglobin A1C: 7.2

## 2016-01-05 MED ORDER — CLOTRIMAZOLE 10 MG MT TROC: 10.0000 mg | Freq: Every day | OROMUCOSAL | 1 refills | Status: DC

## 2016-01-05 NOTE — Patient Instructions (Addendum)
Please see an allergy specialist.  you will receive a phone call, about a day and time for an appointment.   Please continue the same insulin.  I have sent a prescription to your pharmacy, for your tongue.   Please come back for a follow-up appointment in 4 months.

## 2016-01-05 NOTE — Progress Notes (Signed)
Subjective:    Patient ID: See Jessica [name], female    DOB: [birth year], 65 y.o.   MRN: [id number]  HPI  Pt returns for f/u of diabetes mellitus: DM type: Insulin-requiring type 2. Dx'ed: 2000.  Complications: polyneuropathy and autonomic neuropathy.   Therapy: insulin since 2005 GDM: never DKA: never Severe hypoglycemia: never.  Pancreatitis: never.  Other: Pt says her ability to care for her DM continues to be compromised by her husband's illness; she has done better on a BID insulin regimen; she takes human insulin, due to cost.   Interval history: no cbg record, but states cbg's are well-controlled.   Pt states few days of slight darkening of the tongue, and assoc irreg surface.  She recently had a few days of nausea and diarrhea.  Past Medical History:  Diagnosis Date  . Anemia    borderline  . Anxiety   . Arthritis    bilateral knees  . Asthma    childhood  . Autonomic neuropathy    diabetic  . Cataract   . Celiac disease   . Chest pain    intermittant left sided chest pain, non-exertional, lasting 1-2 min  . Depression   . Diabetes mellitus    type II, Hemoglobin A1C 9.9 10/05/2011  . Diabetic neuropathy ([place])   . Diverticulosis   . Fatty liver   . Gastroparesis   . GERD (gastroesophageal reflux disease)   . Heart murmur   . Hemorrhoids   . Hypertension   . IBS (irritable bowel syndrome)   . Narrowing of airway   . Nasal congestion current   sore throat, ears-on antibiotic  . Personal history of colonic polyps 03/2010   hyperplastic  . Pneumonia 11/03/2015   current  . PONV (postoperative nausea and vomiting)   . Shortness of breath dyspnea   . Unspecified gastritis and gastroduodenitis without mention of hemorrhage     Past Surgical History:  Procedure Laterality Date  . APPENDECTOMY    . COLONOSCOPY    . DILATATION & CURRETTAGE/HYSTEROSCOPY WITH RESECTOCOPE N/A 03/24/2013   Procedure: DILATATION & CURETTAGE, HYSTEROSCOPY WITH RESECTION;  Surgeon:  Olga Millers, MD;  Location: [facility];  Service: Gynecology;  Laterality: N/A;  . HYSTEROSCOPY W/D&C N/A 11/08/2015   Procedure: DILATATION AND CURETTAGE /HYSTEROSCOPY;  Surgeon: Olga Millers, MD;  Location: [facility];  Service: Gynecology;  Laterality: N/A;  . left breast cyst removal  2000  . TUBAL LIGATION      Social History   Social History  . Marital status: Married    Spouse name: Devar Burks  . Number of children: 2  . 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 Smoking status: Never Smoker  . Smokeless tobacco: Never Used  . Alcohol use No  . Drug use: No  . Sexual activity: Yes    Birth control/ protection: Post-menopausal, Surgical   Other Topics Concern  . Not on file   Social History Narrative  . No narrative on file    Current Outpatient Prescriptions on File Prior to Visit  Medication Sig Dispense Refill  . atorvastatin (LIPITOR) 40 MG tablet Take 1 tablet (40 mg total) by mouth daily. 30 tablet 1  . B-D INS SYR ULTRAFINE 1CC/31G 31G X 5/16" 1 ML MISC USES UP TO 3 A DAY 100 each 2  . clidinium-chlordiazePOXIDE (LIBRAX) 5-2.5 MG per capsule Take 1 capsule by mouth 3 (  three) times daily as needed. 100 capsule 6  . DEXILANT 60 MG capsule TAKE 1 CAPSULE (60 MG TOTAL) BY MOUTH DAILY. 30 capsule 1  . Fluticasone-Salmeterol (ADVAIR) 100-50 MCG/DOSE AEPB Inhale 1 puff into the lungs 2 (two) times daily. 1 each 3  . gabapentin (NEURONTIN) 300 MG capsule TAKE 1 CAPSULE BY MOUTH 3 TIMES DAILY 180 capsule 2  . gabapentin (NEURONTIN) 300 MG capsule TAKE 1 CAPSULE BY MOUTH 3 TIMES DAILY 270 capsule 1  . glucose blood (ONE TOUCH ULTRA TEST) test strip USE TO TEST BLOOD SUGAR 2 TIMES DAILY. DX: E11.43 100 each 4  . hyoscyamine (LEVSIN SL) 0.125 MG SL tablet PLACE 1 TABLET UNDER TONGUE EVERY 4 HOURS AS NEEDED 30 tablet 1  . insulin NPH-regular Human (HUMULIN 70/30) (70-30) 100 UNIT/ML injection INJECT 110  UNITS WITH BREAKFAST, AND 50 UNITS WITH THE EVENING MEAL 60 mL 5  . polyethylene glycol powder (GLYCOLAX/MIRALAX) powder TAKE 1 CAPFUL (17 GRAMS) DISSOLVED IN AT LEAST 8 OUNCES WATER/JUICE AND DRINK EVERY NIGHT. 527 g 1  . ranitidine (ZANTAC) 150 MG tablet TAKE 1 TABLET (150 MG TOTAL) BY MOUTH AT BEDTIME AS NEEDED FOR HEARTBURN. 30 tablet 3  . valsartan (DIOVAN) 320 MG tablet TAKE 1 TABLET BY MOUTH EVERY DAY 30 tablet 2  . Vitamin D, Ergocalciferol, (DRISDOL) 50000 units CAPS capsule Take 1 capsule (50,000 Units total) by mouth every 7 (seven) days. 12 capsule 0  . tiZANidine (ZANAFLEX) 4 MG tablet Take 1 tablet (4 mg total) by mouth Nightly. (Patient not taking: Reported on 01/05/2016) 30 tablet 2   No current facility-administered medications on file prior to visit.     Allergies  Allergen Reactions  . Nsaids Nausea And Vomiting    Cannot tolerate large doses     Family History  Problem Relation Age of Onset  . Colon cancer Sister   . Diabetes Sister   . Diabetes Brother   . Heart disease Father   . Other Father     2 collapsed lungs    BP 128/70   Pulse 84   Wt 258 lb (117 kg)   SpO2 95%   BMI 42.93 kg/m    Review of Systems Tongue is slightly painful.  She denies hypoglycemia    Objective:   Physical Exam VITAL SIGNS:  See vs page GENERAL: no distress.   mouth: the tongue is white at the right and posterior aspect. It is redder at the anterior/left aspect.     A1c=7.2%    Assessment & Plan:  Insulin-requiring type 2 DM: this is the best control this pt should aim for, given this regimen, which does match insulin to her changing needs throughout the day Tongue discoloration, new, uncertain etiology

## 2016-01-07 ENCOUNTER — Emergency Department (HOSPITAL_COMMUNITY): Payer: Medicare Other

## 2016-01-07 ENCOUNTER — Emergency Department (HOSPITAL_COMMUNITY)
Admission: EM | Admit: 2016-01-07 | Discharge: 2016-01-07 | Disposition: A | Discharge status: Home / Self Care | Payer: Medicare Other | Attending: Emergency Medicine | Admitting: Emergency Medicine

## 2016-01-07 ENCOUNTER — Encounter (HOSPITAL_COMMUNITY): Payer: Self-pay | Admitting: *Deleted

## 2016-01-07 DIAGNOSIS — N201 Calculus of ureter: Secondary | ICD-10-CM | POA: Insufficient documentation

## 2016-01-07 DIAGNOSIS — Z794 Long term (current) use of insulin: Secondary | ICD-10-CM | POA: Insufficient documentation

## 2016-01-07 DIAGNOSIS — K2970 Gastritis, unspecified, without bleeding: Secondary | ICD-10-CM | POA: Diagnosis not present

## 2016-01-07 DIAGNOSIS — Z85038 Personal history of other malignant neoplasm of large intestine: Secondary | ICD-10-CM | POA: Insufficient documentation

## 2016-01-07 DIAGNOSIS — R079 Chest pain, unspecified: Secondary | ICD-10-CM | POA: Diagnosis not present

## 2016-01-07 DIAGNOSIS — J45909 Unspecified asthma, uncomplicated: Secondary | ICD-10-CM | POA: Diagnosis not present

## 2016-01-07 DIAGNOSIS — E1140 Type 2 diabetes mellitus with diabetic neuropathy, unspecified: Secondary | ICD-10-CM | POA: Insufficient documentation

## 2016-01-07 DIAGNOSIS — R1031 Right lower quadrant pain: Secondary | ICD-10-CM

## 2016-01-07 DIAGNOSIS — I10 Essential (primary) hypertension: Secondary | ICD-10-CM | POA: Diagnosis not present

## 2016-01-07 DIAGNOSIS — R112 Nausea with vomiting, unspecified: Secondary | ICD-10-CM | POA: Diagnosis not present

## 2016-01-07 DIAGNOSIS — N23 Unspecified renal colic: Secondary | ICD-10-CM

## 2016-01-07 DIAGNOSIS — N1330 Unspecified hydronephrosis: Secondary | ICD-10-CM | POA: Diagnosis not present

## 2016-01-07 DIAGNOSIS — Z79899 Other long term (current) drug therapy: Secondary | ICD-10-CM | POA: Diagnosis not present

## 2016-01-07 LAB — URINALYSIS, ROUTINE W REFLEX MICROSCOPIC
Bilirubin Urine: NEGATIVE
Glucose, UA: >1000 mg/dL — AB
Ketones, ur: NEGATIVE mg/dL
Leukocytes, UA: NEGATIVE
Nitrite: NEGATIVE
Protein, ur: NEGATIVE mg/dL
Specific Gravity, Urine: 1.038 — ABNORMAL HIGH (ref 1.005–1.030)
pH: 5.5 (ref 5.0–8.0)

## 2016-01-07 LAB — COMPREHENSIVE METABOLIC PANEL
ALT: 20 U/L (ref 14–54)
AST: 28 U/L (ref 15–41)
Albumin: 3.7 g/dL (ref 3.5–5.0)
Alkaline Phosphatase: 92 U/L (ref 38–126)
Anion gap: 17 — ABNORMAL HIGH (ref 5–15)
BUN: 22 mg/dL — ABNORMAL HIGH (ref 6–20)
CO2: 14 mmol/L — ABNORMAL LOW (ref 22–32)
Calcium: 9.4 mg/dL (ref 8.9–10.3)
Chloride: 109 mmol/L (ref 101–111)
Creatinine, Ser: 1.05 mg/dL — ABNORMAL HIGH (ref 0.44–1.00)
GFR calc Af Amer: >60 mL/min (ref >60)
GFR calc non Af Amer: 55 mL/min — ABNORMAL LOW (ref >60)
Glucose, Bld: 211 mg/dL — ABNORMAL HIGH (ref 65–99)
Potassium: 4.4 mmol/L (ref 3.5–5.1)
Sodium: 140 mmol/L (ref 135–145)
Total Bilirubin: 0.5 mg/dL (ref 0.3–1.2)
Total Protein: 7.2 g/dL (ref 6.5–8.1)

## 2016-01-07 LAB — CBC
HCT: 44.6 % (ref 36.0–46.0)
Hemoglobin: 15.5 g/dL — ABNORMAL HIGH (ref 12.0–15.0)
MCH: 32.8 pg (ref 26.0–34.0)
MCHC: 34.8 g/dL (ref 30.0–36.0)
MCV: 94.3 fL (ref 78.0–100.0)
Platelets: 214 10*3/uL (ref 150–400)
RBC: 4.73 MIL/uL (ref 3.87–5.11)
RDW: 13 % (ref 11.5–15.5)
WBC: 8.3 10*3/uL (ref 4.0–10.5)

## 2016-01-07 LAB — I-STAT TROPONIN, ED: Troponin i, poc: 0 ng/mL (ref 0.00–0.08)

## 2016-01-07 LAB — URINE MICROSCOPIC-ADD ON

## 2016-01-07 LAB — LIPASE, BLOOD: Lipase: 17 U/L (ref 11–51)

## 2016-01-07 LAB — TROPONIN I: Troponin I: <0.03 ng/mL (ref <0.03)

## 2016-01-07 LAB — LACTATE DEHYDROGENASE: LDH: 289 U/L — ABNORMAL HIGH (ref 98–192)

## 2016-01-07 LAB — CK: Total CK: 307 U/L — ABNORMAL HIGH (ref 38–234)

## 2016-01-07 MED ORDER — GI COCKTAIL ~~LOC~~
30.0000 mL | Freq: Once | ORAL | Status: AC
  Administered 2016-01-07: 30 mL via ORAL
  Filled 2016-01-07: qty 30

## 2016-01-07 MED ORDER — ONDANSETRON 4 MG PO TBDP
8.0000 mg | ORAL_TABLET | Freq: Once | ORAL | Status: AC
  Administered 2016-01-07: 8 mg via ORAL
  Filled 2016-01-07: qty 2

## 2016-01-07 MED ORDER — ONDANSETRON HCL 4 MG PO TABS: 4.0000 mg | ORAL_TABLET | Freq: Three times a day (TID) | ORAL | 0 refills | Status: DC | PRN

## 2016-01-07 MED ORDER — MORPHINE SULFATE (PF) 4 MG/ML IV SOLN
4.0000 mg | Freq: Once | INTRAVENOUS | Status: AC
  Administered 2016-01-07: 4 mg via INTRAVENOUS
  Filled 2016-01-07: qty 1

## 2016-01-07 MED ORDER — OXYCODONE-ACETAMINOPHEN 5-325 MG PO TABS: ORAL_TABLET | ORAL | 0 refills | Status: DC

## 2016-01-07 MED ORDER — OXYCODONE-ACETAMINOPHEN 5-325 MG PO TABS
1.0000 | ORAL_TABLET | Freq: Once | ORAL | Status: AC
  Administered 2016-01-07: 1 via ORAL
  Filled 2016-01-07: qty 1

## 2016-01-07 MED ORDER — ONDANSETRON HCL 4 MG/2ML IJ SOLN
4.0000 mg | Freq: Once | INTRAMUSCULAR | Status: AC
  Administered 2016-01-07: 4 mg via INTRAVENOUS
  Filled 2016-01-07: qty 2

## 2016-01-07 NOTE — ED Notes (Signed)
Drew multiple lab tubes and sent down to lab rt lab calling for a second time stating they don't have sufficient amounts of blood. Called lab and informed them I sent down 6 full tubes of blood: 3 light green, 1 light blue, 1 dark green and 1 lavender.

## 2016-01-07 NOTE — ED Triage Notes (Signed)
Pt arrives from work via [employer]. Pt states she had a sudden onset of RLQ pain while at work today that radiates up to her entire chest and to her right flank. Pt states she became dizzy, nauseous and diaphoretic. Pt states she attempted to have a bm to see if that would relieve pain with no success. Pt states her CP feels like indigestion. Pt received 16m of zofran and 200 mcg of fentanyl pta.

## 2016-01-07 NOTE — ED Provider Notes (Signed)
[facility] DEPT Provider Note   CSN: [id number] Arrival date & time: 01/07/16  1353     History   Chief Complaint Chief Complaint  Patient presents with  . Chest Pain    HPI Illene Sweeting [name] is a 65 y.o. female.  Patient with past medical history of insulin-dependent diabetes, hypertension, gastroparesis, GERD/gastritis, diverticulosis, IBS who presents with acute onset of right lower quadrant pain which radiates to the right flank and right chest. She reports that she was at work when she began to experience symptoms of indigestion with sensation of something caught in her throat, she had a burning sensation associated with this typical of her GERD. She is on chronic PPI therapy at home and reports that these symptoms were not new. However shortly after these began she became nauseous, diaphoretic, and had an acute right lower quadrant abdominal pain and right flank pain. She said this pain was intense and sharp and she thought that perhaps she needed to have gas. She went bathroom but was unable to have a bowel movement or any flatulence. She reports that her last bowel movement was this morning and was normal. She denies any diarrhea, no blood in the stool. She reports that she did have some vomiting several times after the onset of her symptoms which was nonbloody, nonbilious. Patient denies any history of these abdominal complaints. She notes that several years ago she had an appendectomy, has had surgical excision of ovarian cyst, and tubal ligation. Patient denies any history of cholelithiasis.  Patient reports that at this time she does not have any significant chest pain does continue to have diffuse right-sided abdominal pain. She was given pain medication by EMS and Route along with Zofran and reports that this is having some success in controlling her pain and nausea.  Patient reports a one-week history of some shortness of breath which she associated with the change in weather  becoming more cold. She does have a history of    Past Medical History:  Diagnosis Date  . Anemia    borderline  . Anxiety   . Arthritis    bilateral knees  . Asthma    childhood  . Autonomic neuropathy    diabetic  . Cataract   . Celiac disease   . Chest pain    intermittant left sided chest pain, non-exertional, lasting 1-2 min  . Depression   . Diabetes mellitus    type II, Hemoglobin A1C 9.9 10/05/2011  . Diabetic neuropathy ([place])   . Diverticulosis   . Fatty liver   . Gastroparesis   . GERD (gastroesophageal reflux disease)   . Heart murmur   . Hemorrhoids   . Hypertension   . IBS (irritable bowel syndrome)   . Narrowing of airway   . Nasal congestion current   sore throat, ears-on antibiotic  . Personal history of colonic polyps 03/2010   hyperplastic  . Pneumonia 11/03/2015   current  . PONV (postoperative nausea and vomiting)   . Shortness of breath dyspnea   . Unspecified gastritis and gastroduodenitis without mention of hemorrhage     Patient Active Problem List   Diagnosis Date Noted  . Wellness examination 11/03/2015  . Dyspnea 11/03/2015  . Other nonspecific abnormal finding of lung field 11/03/2015  . Degenerative arthritis of knee, bilateral 10/29/2015  . Left shoulder pain 08/02/2015  . Cervical disc disorder with radiculopathy of cervical region 08/02/2015  . Degenerative cervical disc 07/27/2015  . Screening examination for infectious disease 12/19/2013  .  Primary localized osteoarthrosis, lower leg 09/05/2013  . [place] 06/07/2013  . Menopausal state 05/21/2013  . Nonallopathic lesion of lumbar region 02/20/2013  . Nonallopathic lesion of sacral region 02/20/2013  . Nonallopathic lesion of thoracic region 02/20/2013  . Low back pain 12/20/2012  . Encounter for long-term (current) use of other medications 04/04/2012  . Dysuria 01/04/2012  . Chest pain 10/05/2011  . Rash 02/28/2011  . OSA (obstructive sleep apnea) 02/28/2011  .  Constipation, slow transit 09/23/2010  . Dyspepsia and other specified disorders of function of stomach 09/23/2010  . DM (diabetes mellitus) ([place]) 09/23/2010  . FH: colon cancer 09/23/2010  . Personal history of colonic polyps 09/23/2010  . Plantar fasciitis 08/17/2010  . Acute upper respiratory infections of unspecified site 07/22/2010  . SINUSITIS, ACUTE 05/28/2010  . EDEMA 12/20/2009  . SHOULDER DISLOCATION 12/20/2009  . NUMBNESS 10/07/2009  . OTITIS EXTERNA, ACUTE 04/01/2009  . HYPERCHOLESTEROLEMIA 01/29/2009  . CHEST PAIN UNSPECIFIED 01/14/2009  . ABDOMINAL PAIN 01/14/2009  . CONSTIPATION 11/05/2008  . LEUKOPENIA, CHRONIC 04/01/2008  . ASYMPTOMATIC POSTMENOPAUSAL STATUS 04/01/2008  . SKIN DISORDER 12/26/2007  . CRAMP IN LIMB 09/26/2007  . NECK MASS 09/26/2007  . Allergic rhinitis 06/21/2007  . LEG PAIN, BILATERAL 06/21/2007  . GASTRITIS 02/27/2007  . GASTROPARESIS 02/27/2007  . FOREIGN BODY IN DIGESTIVE SYSTEM UNSPECIFIED 02/27/2007  . Diabetes ([place]) 10/22/2006  . AUTONOMIC NEUROPATHY, DIABETIC 10/22/2006  . ANEMIA-IRON DEFICIENCY 10/22/2006  . Essential hypertension 10/22/2006  . HEMORRHOIDS 10/22/2006  . ASTHMA, CHILDHOOD 10/22/2006    Past Surgical History:  Procedure Laterality Date  . APPENDECTOMY    . COLONOSCOPY    . DILATATION & CURRETTAGE/HYSTEROSCOPY WITH RESECTOCOPE N/A 03/24/2013   Procedure: DILATATION & CURETTAGE, HYSTEROSCOPY WITH RESECTION;  Surgeon: Olga Millers, MD;  Location: [facility];  Service: Gynecology;  Laterality: N/A;  . HYSTEROSCOPY W/D&C N/A 11/08/2015   Procedure: DILATATION AND CURETTAGE /HYSTEROSCOPY;  Surgeon: Olga Millers, MD;  Location: [facility];  Service: Gynecology;  Laterality: N/A;  . left breast cyst removal  2000  . TUBAL LIGATION      OB History    No data available       Home Medications    Prior to Admission medications   Medication Sig Start Date End Date Taking? Authorizing Provider  atorvastatin (LIPITOR) 40  MG tablet Take 1 tablet (40 mg total) by mouth daily. 12/09/15  Yes Renato Shin, MD  B-D INS SYR ULTRAFINE 1CC/31G 31G X 5/16" 1 ML MISC USES UP TO 3 A DAY 07/08/15  Yes Renato Shin, MD  clidinium-chlordiazePOXIDE (LIBRAX) 5-2.5 MG per capsule Take 1 capsule by mouth 3 (three) times daily as needed. Patient taking differently: Take 1 capsule by mouth 3 (three) times daily as needed (for IBS).  02/18/13  Yes Sable Feil, MD  clotrimazole (MYCELEX) 10 MG troche Take 1 tablet (10 mg total) by mouth 5 (five) times daily. 01/05/16  Yes Renato Shin, MD  DEXILANT 60 MG capsule TAKE 1 CAPSULE (60 MG TOTAL) BY MOUTH DAILY. 12/29/15  Yes Jerene Bears, MD  Fluticasone-Salmeterol (ADVAIR) 100-50 MCG/DOSE AEPB Inhale 1 puff into the lungs 2 (two) times daily. Patient taking differently: Inhale 1 puff into the lungs daily as needed (for wheezing or shortness of breath).  04/14/15  Yes Renato Shin, MD  gabapentin (NEURONTIN) 300 MG capsule TAKE 1 CAPSULE BY MOUTH 3 TIMES DAILY Patient taking differently: TAKE 1 CAPSULE (300 mg) BY MOUTH 2 TIMES DAILY 06/29/14  Yes Lyndal Pulley,  DO  glucose blood (ONE TOUCH ULTRA TEST) test strip USE TO TEST BLOOD SUGAR 2 TIMES DAILY. DX: E11.43 09/07/15  Yes Renato Shin, MD  hyoscyamine (LEVSIN SL) 0.125 MG SL tablet PLACE 1 TABLET UNDER TONGUE EVERY 4 HOURS AS NEEDED 05/04/14  Yes Jessica D Zehr, PA-C  insulin NPH-regular Human (HUMULIN 70/30) (70-30) 100 UNIT/ML injection INJECT 110 UNITS WITH BREAKFAST, AND 50 UNITS WITH THE EVENING MEAL 11/03/15  Yes Renato Shin, MD  polyethylene glycol powder (GLYCOLAX/MIRALAX) powder TAKE 1 CAPFUL (17 GRAMS) DISSOLVED IN AT LEAST 8 OUNCES WATER/JUICE AND DRINK EVERY NIGHT. 12/27/15  Yes Jerene Bears, MD  ranitidine (ZANTAC) 150 MG tablet TAKE 1 TABLET (150 MG TOTAL) BY MOUTH AT BEDTIME AS NEEDED FOR HEARTBURN. 11/11/15  Yes Jerene Bears, MD  valACYclovir (VALTREX) 500 MG tablet Take 500 mg by mouth daily. 12/21/15  Yes Historical Provider, MD    valsartan (DIOVAN) 320 MG tablet TAKE 1 TABLET BY MOUTH EVERY DAY Patient taking differently: TAKE 1 TABLET (320 mg) BY MOUTH EVERY DAY 11/25/15  Yes Renato Shin, MD  Vitamin D, Ergocalciferol, (DRISDOL) 50000 units CAPS capsule Take 1 capsule (50,000 Units total) by mouth every 7 (seven) days. Patient taking differently: Take 50,000 Units by mouth every Monday.  10/29/15  Yes Lyndal Pulley, DO    Family History Family History  Problem Relation Age of Onset  . Colon cancer Sister   . Diabetes Sister   . Diabetes Brother   . Heart disease Father   . Other Father     2 collapsed lungs    Social History Social History  Substance Use Topics  . Smoking status: Never Smoker  . Smokeless tobacco: Never Used  . Alcohol use No     Allergies   Gluten meal and Nsaids   Review of Systems Review of Systems  Constitutional: Positive for diaphoresis. Negative for appetite change, chills and fever.  HENT: Negative for congestion and sore throat.   Eyes: Negative for pain and visual disturbance.  Respiratory: Positive for shortness of breath (mild x 1 wk). Negative for cough and wheezing.   Cardiovascular: Positive for chest pain (burning substernal associated w/ "lump in throat"). Negative for palpitations and leg swelling.  Gastrointestinal: Positive for abdominal pain (RLQ), nausea and vomiting. Negative for blood in stool, constipation and diarrhea.  Genitourinary: Positive for flank pain (right). Negative for dysuria, frequency, hematuria and urgency.  Musculoskeletal: Negative for arthralgias, back pain and myalgias.  Skin: Negative for color change and rash.  Neurological: Negative for dizziness, syncope and light-headedness.  All other systems reviewed and are negative.    Physical Exam Updated Vital Signs BP (!) 144/50   Pulse 79   Temp 98.3 F (36.8 C) (Oral)   Resp 18   Ht 5' 5"  (1.651 m)   Wt 113.4 kg   SpO2 99%   BMI 41.60 kg/m   Physical Exam   Constitutional: She is oriented to person, place, and time. She appears well-developed. No distress.  HENT:  Head: Normocephalic and atraumatic.  Mouth/Throat: Oropharynx is clear and moist.  Eyes: Conjunctivae are normal.  Neck: Neck supple.  Cardiovascular: Normal rate, regular rhythm, normal heart sounds and intact distal pulses.   Pulmonary/Chest: Effort normal and breath sounds normal. No respiratory distress. She has no wheezes. She has no rales. She exhibits no tenderness.  Abdominal: Soft. Bowel sounds are normal. She exhibits no ascites. There is no hepatosplenomegaly. There is tenderness in the right lower quadrant. There  is CVA tenderness. There is no rigidity, no rebound, no guarding, no tenderness at McBurney's point and negative Murphy's sign.  Musculoskeletal: She exhibits no edema or tenderness.  Neurological: She is alert and oriented to person, place, and time.  Skin: Skin is warm and dry. She is not diaphoretic.  Psychiatric: She has a normal mood and affect.  Nursing note and vitals reviewed.    ED Treatments / Results  Labs (all labs ordered are listed, but only abnormal results are displayed) Labs Reviewed  CBC - Abnormal; Notable for the following:       Result Value   Hemoglobin 15.5 (*)    All other components within normal limits  URINALYSIS, ROUTINE W REFLEX MICROSCOPIC (NOT AT [facility]) - Abnormal; Notable for the following:    Specific Gravity, Urine 1.038 (*)    Glucose, UA >1000 (*)    Hgb urine dipstick MODERATE (*)    All other components within normal limits  URINE MICROSCOPIC-ADD ON - Abnormal; Notable for the following:    Squamous Epithelial / LPF 0-5 (*)    Bacteria, UA RARE (*)    All other components within normal limits  COMPREHENSIVE METABOLIC PANEL  LIPASE, BLOOD  TROPONIN I  CK  LACTATE DEHYDROGENASE  I-STAT TROPOININ, ED    EKG  EKG Interpretation  Date/Time:  Friday January 07 2016 14:06:49 EDT Ventricular Rate:  84 PR  Interval:    QRS Duration: 92 QT Interval:  380 QTC Calculation: 450 R Axis:   97 Text Interpretation:  Sinus rhythm Right atrial enlargement Right axis deviation No significant change was found Confirmed by Wyvonnia Dusky  MD, STEPHEN [phone]) on 01/07/2016 2:25:29 PM       Radiology Dg Chest Port 1 View  Result Date: 01/07/2016 CLINICAL DATA:  Sudden onset right lower quadrant pain while at work today ranging up to chest and right flank. EXAM: PORTABLE CHEST 1 VIEW COMPARISON:  11/30/2015 FINDINGS: 1440 hours. The lungs are clear wiithout focal pneumonia, edema, pneumothorax or pleural effusion. The cardiopericardial silhouette is within normal limits for size. The visualized bony structures of the thorax are intact. Telemetry leads overlie the chest. IMPRESSION: No active disease. Electronically Signed   By: Misty Stanley M.D.   On: 01/07/2016 14:56    Procedures Procedures (including critical care time)  Medications Ordered in ED Medications  gi cocktail (Maalox,Lidocaine,Donnatal) (30 mLs Oral Given 01/07/16 1514)     Initial Impression / Assessment and Plan / ED Course  I have reviewed the triage vital signs and the nursing notes.  Pertinent labs & imaging results that were available during my care of the patient were reviewed by me and considered in my medical decision making (see chart for details).  Clinical Course  Value Comment By Time   Pt seen and evaluated. RLQ and R flank pain. Will initiate basic w/u. Consider CT abd for concern of divertifulitis vs cholecystitis vs nephrolithiasis. Holley Raring, MD 09/08 1439  WBC: 8.3 No leukocytosis, afebrile. Infection lower on DDx. Holley Raring, MD 09/08 1615   Non-con CT AP for evaluation of renal stones. Holley Raring, MD 09/08 1633   Consider RUQ [place] for GB stones. Holley Raring, MD 09/08 1637  Hgb urine dipstick: (!) MODERATE Mod dipstick blood, concern for nephrolithiasis. Microscopic analysis w/ few RBCs, will check CK and LDH to  r/o rhabdo/hemolysis. Holley Raring, MD 09/08 1649   Substernal CP presumed 2/2 GERD resolved after GI cocktail. EKg and initial trop negative, repeat at 1800. Gaspar Bidding  Gay Filler, MD 09/08 1652   Signout given to Dr. Thurnell Garbe.  Final Clinical Impressions(s) / ED Diagnoses   Final diagnoses:  Right lower quadrant abdominal pain    New Prescriptions New Prescriptions   No medications on file     Holley Raring, MD 01/07/16 1639    Holley Raring, MD 01/07/16 1652    Holley Raring, MD 01/07/16 [place], MD 01/07/16 2205

## 2016-01-07 NOTE — Discharge Instructions (Signed)
Take the prescriptions as directed.  Also take over the counter ibuprofen, 2 tablets by mouth every 4 hours with food, for the next week.  Call the Urologist tomorrow to schedule a follow up appointment in the next 3 days.  Return to the Emergency Department immediately if worsening.

## 2016-01-07 NOTE — ED Notes (Signed)
Lab states there isn't enough blood to run CMP and lipase.

## 2016-01-07 NOTE — ED Provider Notes (Signed)
Pt received at sign out with CT pending. Pt c/o sudden onset of right sided flank pain which radiated into the right side of her abd as well as nausea while at work PTA. Described the pain as "real bad, like childbirth." States the pain "kept moving" from her right flank to her RUQ then RLQ. Denies CP/SOB. CT scan with recently passed stone. Feels better after meds and wants to go home now. Pt requesting another dose of pain/nausea meds before d/c. Dx and testing d/w pt and family.  Questions answered.  Verb understanding, agreeable to d/c home with outpt f/u.    MDM Reviewed: previous chart, nursing note and vitals Reviewed previous: labs and ECG Interpretation: labs, ECG, x-ray and CT scan    Results for orders placed or performed during the hospital encounter of 01/07/16  CBC  Result Value Ref Range   WBC 8.3 4.0 - 10.5 K/uL   RBC 4.73 3.87 - 5.11 MIL/uL   Hemoglobin 15.5 (H) 12.0 - 15.0 g/dL   HCT 44.6 36.0 - 46.0 %   MCV 94.3 78.0 - 100.0 fL   MCH 32.8 26.0 - 34.0 pg   MCHC 34.8 30.0 - 36.0 g/dL   RDW 13.0 11.5 - 15.5 %   Platelets 214 150 - 400 K/uL  Urinalysis, Routine w reflex microscopic  Result Value Ref Range   Color, Urine YELLOW YELLOW   APPearance CLEAR CLEAR   Specific Gravity, Urine 1.038 (H) 1.005 - 1.030   pH 5.5 5.0 - 8.0   Glucose, UA >1000 (A) NEGATIVE mg/dL   Hgb urine dipstick MODERATE (A) NEGATIVE   Bilirubin Urine NEGATIVE NEGATIVE   Ketones, ur NEGATIVE NEGATIVE mg/dL   Protein, ur NEGATIVE NEGATIVE mg/dL   Nitrite NEGATIVE NEGATIVE   Leukocytes, UA NEGATIVE NEGATIVE  Comprehensive metabolic panel  Result Value Ref Range   Sodium 140 135 - 145 mmol/L   Potassium 4.4 3.5 - 5.1 mmol/L   Chloride 109 101 - 111 mmol/L   CO2 14 (L) 22 - 32 mmol/L   Glucose, Bld 211 (H) 65 - 99 mg/dL   BUN 22 (H) 6 - 20 mg/dL   Creatinine, Ser 1.05 (H) 0.44 - 1.00 mg/dL   Calcium 9.4 8.9 - 10.3 mg/dL   Total Protein 7.2 6.5 - 8.1 g/dL   Albumin 3.7 3.5 - 5.0 g/dL    AST 28 15 - 41 U/L   ALT 20 14 - 54 U/L   Alkaline Phosphatase 92 38 - 126 U/L   Total Bilirubin 0.5 0.3 - 1.2 mg/dL   GFR calc non Af Amer 55 (L) >60 mL/min   GFR calc Af Amer >60 >60 mL/min   Anion gap 17 (H) 5 - 15  Lipase, blood  Result Value Ref Range   Lipase 17 11 - 51 U/L  Urine microscopic-add on  Result Value Ref Range   Squamous Epithelial / LPF 0-5 (A) NONE SEEN   WBC, UA 0-5 0 - 5 WBC/hpf   RBC / HPF 0-5 0 - 5 RBC/hpf   Bacteria, UA RARE (A) NONE SEEN  Troponin I  Result Value Ref Range   Troponin I <0.03 <0.03 ng/mL  CK  Result Value Ref Range   Total CK 307 (H) 38 - 234 U/L  Lactate dehydrogenase  Result Value Ref Range   LDH 289 (H) 98 - 192 U/L  I-stat troponin, ED  Result Value Ref Range   Troponin i, poc 0.00 0.00 - 0.08 ng/mL   Comment  3           Ct Abdomen Pelvis Wo Contrast Result Date: 01/07/2016 CLINICAL DATA:  65 year old female with right lower quadrant abdominal pain, nausea vomiting. EXAM: CT ABDOMEN AND PELVIS WITHOUT CONTRAST TECHNIQUE: Multidetector CT imaging of the abdomen and pelvis was performed following the standard protocol without IV contrast. COMPARISON:  None. FINDINGS: Evaluation of this exam is limited in the absence of intravenous contrast. The visualized lung bases are clear. No intra-abdominal free air or free fluid. The liver, gallbladder, pancreas, spleen, adrenal glands appear unremarkable. The left kidney, left ureter appear unremarkable. There is mild right hydronephroureter. An extrarenal pelvis noted on the right. A punctate nonobstructing right renal inferior pole calculus may be present. There is focal kink of the proximal right ureter without evidence of obstruction. The ureters distally is also mildly distended. A punctate stone noted at the base of the urinary bladder (series 2, image 90 and coronal series 5, image 75) likely representing a recently passed right renal calculus. The uterus and ovaries are grossly unremarkable.  There is no evidence of bowel obstruction or active inflammation. Appendectomy. IMPRESSION: Mild right hydronephroureter likely related to a recently passed punctate right renal stone now within the urinary bladder. Electronically Signed   By: Anner Crete M.D.   On: 01/07/2016 19:14   Dg Chest Port 1 View Result Date: 01/07/2016 CLINICAL DATA:  Sudden onset right lower quadrant pain while at work today ranging up to chest and right flank. EXAM: PORTABLE CHEST 1 VIEW COMPARISON:  11/30/2015 FINDINGS: 1440 hours. The lungs are clear wiithout focal pneumonia, edema, pneumothorax or pleural effusion. The cardiopericardial silhouette is within normal limits for size. The visualized bony structures of the thorax are intact. Telemetry leads overlie the chest. IMPRESSION: No active disease. Electronically Signed   By: Misty Stanley M.D.   On: 01/07/2016 14:56      Francine Graven, DO 01/07/16 2004

## 2016-01-07 NOTE — ED Notes (Signed)
Patient transported to CT 

## 2016-01-11 DIAGNOSIS — N8500 Endometrial hyperplasia, unspecified: Secondary | ICD-10-CM | POA: Diagnosis not present

## 2016-01-13 DIAGNOSIS — N200 Calculus of kidney: Secondary | ICD-10-CM | POA: Diagnosis not present

## 2016-01-13 DIAGNOSIS — N209 Urinary calculus, unspecified: Secondary | ICD-10-CM | POA: Insufficient documentation

## 2016-01-13 NOTE — Telephone Encounter (Signed)
Patient had a kidney stone attack Friday, she need a referral to a urologist she requested [facility] please advise

## 2016-01-13 NOTE — Telephone Encounter (Signed)
See message and please advise, Thanks!  

## 2016-01-13 NOTE — Telephone Encounter (Signed)
done

## 2016-01-14 NOTE — Telephone Encounter (Signed)
Attempted the reach the patient. Patient was unavailable. Will try again at a later time.

## 2016-01-14 NOTE — Telephone Encounter (Signed)
I contacted the patient and left a voicemail advising referral has been placed.

## 2016-01-18 NOTE — Assessment & Plan Note (Signed)
Orthovisc No. 3 out of 4 in a series done bilaterally today. Continue conservative therapy. Patient will come back in 1-2 weeks for fourth and final injections.

## 2016-01-18 NOTE — Progress Notes (Signed)
[name] [address] [place], [place] [address] Phone: [phone] Subjective:     CC:  Bilateral knee pain Follow-up  IRS:[id number]  Jessica [name] is a 65 y.o. female coming in with complaint of bilateral knee pain. 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 Started Orthovisc after failing conservative therapy.  States doing fine. Here for #3 out of 4. States that she is noticing some mild improvement.    Past Medical History:  Diagnosis Date  . Anemia    borderline  . Anxiety   . Arthritis    bilateral knees  . Asthma    childhood  . Autonomic neuropathy    diabetic  . Cataract   . Celiac disease   . Chest pain    intermittant left sided chest pain, non-exertional, lasting 1-2 min  . Depression   . Diabetes mellitus    type II, Hemoglobin A1C 9.9 10/05/2011  . Diabetic neuropathy ([place])   . Diverticulosis   . Fatty liver   . Gastroparesis   . GERD (gastroesophageal reflux disease)   . Heart murmur   . Hemorrhoids   . Hypertension   . IBS (irritable bowel syndrome)   . Narrowing of airway   . Nasal congestion current   sore throat, ears-on antibiotic  . Personal history of colonic polyps 03/2010   hyperplastic  . Pneumonia 11/03/2015   current  . PONV (postoperative nausea and vomiting)   . Shortness of breath dyspnea   . Unspecified gastritis and gastroduodenitis without mention of hemorrhage    Past Surgical History:  Procedure Laterality Date  . APPENDECTOMY    . COLONOSCOPY    . DILATATION & CURRETTAGE/HYSTEROSCOPY WITH RESECTOCOPE N/A 03/24/2013   Procedure: DILATATION & CURETTAGE, HYSTEROSCOPY WITH RESECTION;  Surgeon: Olga Millers, MD;  Location: [facility];  Service: Gynecology;  Laterality: N/A;  . HYSTEROSCOPY W/D&C N/A 11/08/2015   Procedure: DILATATION AND CURETTAGE /HYSTEROSCOPY;  Surgeon: Olga Millers, MD;  Location: [facility];  Service: Gynecology;  Laterality: N/A;  . left breast cyst removal  2000  . TUBAL LIGATION     Social History   Social History  . Marital status: Married    Spouse name: Devar Bontempo  . Number of children: 2  . 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 Smoking status: Never Smoker  . Smokeless tobacco: Never Used  . Alcohol use No  . Drug use: No  . Sexual activity: Yes    Birth control/ protection: Post-menopausal, Surgical   Other Topics Concern  . None   Social History Narrative  . None   Allergies  Allergen Reactions  . Gluten Meal Other (See Comments)    No wheat or soy  . Nsaids Nausea And Vomiting    Cannot tolerate large doses    Family History  Problem Relation Age of Onset  . Colon cancer Sister   . Diabetes Sister   . Diabetes Brother   . Heart disease Father   . Other Father     2 collapsed lungs    Past medical history, social, surgical and family history all reviewed in electronic medical record.  No pertanent information unless stated regarding to the chief complaint.   Review of Systems: No headache, visual changes, nausea, vomiting, diarrhea, constipation, dizziness,  abdominal pain, skin rash, fevers, chills, night sweats, weight loss, swollen lymph nodes, body aches, joint swelling, muscle aches, chest pain, shortness of breath, mood changes.   Objective  Blood pressure 128/80, pulse 74, weight 261 lb (118.4 kg), SpO2 98 %.  General: No apparent distress alert and oriented x3 mood and affect normal, dressed appropriately. obese HEENT: Pupils equal, extraocular movements intact  Respiratory: Patient's speak in full sentences and does not appear short of breath  Cardiovascular: No lower extremity edema, non tender, no erythema  Skin: Warm dry intact with no signs of infection or rash on extremities or on axial skeleton.  Abdomen: Soft  nontender  Neuro: Cranial nerves II through XII are intact, neurovascularly intact in all extremities with 2+ DTRs and 2+ pulses.  Lymph: No lymphadenopathy of posterior or anterior cervical chain or axillae bilaterally.  Gait antalgic gait.  MSK:  Non tender with full range of motion and good stability and symmetric strength and tone of shoulders, elbows, wrist, hip, and ankles bilaterally. Mild arthritic changes of multiple joints Knee:Bilateral Valgus deformity of the knees bilaterally. Patient does have significant calf to thigh ratio Tenderness mostly over the medial joint line bilaterally Lacking the last 5 of extension as well as flexion bilaterally Instability noted with valgus force painful patellar compression. Patellar glide with mildcrepitus. Patellar and quadriceps tendons unremarkable. Hamstring and quadriceps strength is normal.  No significant change from presents exam  After informed written and verbal consent, patient was seated on exam table. Right knee was prepped with alcohol swab and utilizing anterolateral approach, patient's right knee space was injected with15 mg/2.5 mL of Orthovisc(sodium hyaluronate) in a prefilled syringe was injected easily into the knee through a 22-gauge needle.. Patient tolerated the procedure well without immediate complications.  After informed written and verbal consent, patient was seated on exam table. Left knee was prepped with alcohol swab and utilizing anterolateral approach, patient's left knee space was injected with 15 mg/2.5 mL of Orthovisc(sodium hyaluronate) in a prefilled syringe was injected easily into the knee through a 22-gauge needle... Patient tolerated the procedure well without immediate complications.    Impression and Recommendations:     This case required medical decision making of moderate complexity.      Note: This dictation was prepared with Dragon dictation along with smaller phrase technology. Any  transcriptional errors that result from this process are unintentional.

## 2016-01-19 ENCOUNTER — Encounter: Payer: Self-pay | Admitting: Family Medicine

## 2016-01-19 ENCOUNTER — Ambulatory Visit (INDEPENDENT_AMBULATORY_CARE_PROVIDER_SITE_OTHER): Payer: Medicare Other | Admitting: Family Medicine

## 2016-01-19 DIAGNOSIS — M170 Bilateral primary osteoarthritis of knee: Secondary | ICD-10-CM | POA: Diagnosis not present

## 2016-01-19 NOTE — Telephone Encounter (Signed)
Patient stated who do she need to see a urologist or nephrology doctor, please advise

## 2016-01-19 NOTE — Telephone Encounter (Signed)
urology

## 2016-01-19 NOTE — Patient Instructions (Signed)
Good to see you  Ice 20 minutes 2 times daily. Usually after activity and before bed. Keep doing what you are doing and I am sorry about the kidney stone.  See me again in 1 week for the last one!

## 2016-01-19 NOTE — Telephone Encounter (Signed)
See message and please advise, Thanks!  

## 2016-01-20 NOTE — Telephone Encounter (Signed)
Did you see urol for the kidney stones.

## 2016-01-20 NOTE — Telephone Encounter (Signed)
I contacted the patient and advised of message. Patient voiced understanding. Patient stated she had seen a urologist at [facility] about a week ago and she was not satisfied with her visit. Patient wanted to know if we could make another referral to a different urologist? Please advise, Thanks!

## 2016-01-20 NOTE — Telephone Encounter (Signed)
Ok, Please see a kidney specialist.  you will receive a phone call, about a day and time for an appointment.

## 2016-01-20 NOTE — Telephone Encounter (Signed)
Patient stated she had see the urologist for the kidney stones and did not get any useful information and she is afraid she will have another kidney stone attack.

## 2016-01-21 NOTE — Telephone Encounter (Signed)
I contacted the patient and advised of referral being place. Patient voiced understanding.

## 2016-01-29 ENCOUNTER — Other Ambulatory Visit: Payer: Self-pay | Admitting: Endocrinology

## 2016-01-31 ENCOUNTER — Other Ambulatory Visit: Payer: Self-pay | Admitting: Endocrinology

## 2016-02-01 NOTE — Progress Notes (Signed)
[name] [address], [place] [address] Phone: [phone] Subjective:     CC:  Bilateral knee pain Follow-up  YDX:[id number]  Jessica [name] is a 65 y.o. female coming in with complaint of bilateral knee pain. 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 Started Orthovisc after failing conservative therapy.  States doing fine. Here for #4 out of 4.  Patient states Continued improvement. Patient states that very minimal pain from time to time but nothing like what it was before. Very happy with the results.    Past Medical History:  Diagnosis Date  . Anemia    borderline  . Anxiety   . Arthritis    bilateral knees  . Asthma    childhood  . Autonomic neuropathy    diabetic  . Cataract   . Celiac disease   . Chest pain    intermittant left sided chest pain, non-exertional, lasting 1-2 min  . Depression   . Diabetes mellitus    type II, Hemoglobin A1C 9.9 10/05/2011  . Diabetic neuropathy ([place])   . Diverticulosis   . Fatty liver   . Gastroparesis   . GERD (gastroesophageal reflux disease)   . Heart murmur   . Hemorrhoids   . Hypertension   . IBS (irritable bowel syndrome)   . Narrowing of airway   . Nasal congestion current   sore throat, ears-on antibiotic  . Personal history of colonic polyps 03/2010   hyperplastic  . Pneumonia 11/03/2015   current  . PONV (postoperative nausea and vomiting)   . Shortness of breath dyspnea   . Unspecified gastritis and gastroduodenitis without mention of hemorrhage    Past Surgical History:  Procedure Laterality Date  . APPENDECTOMY    . COLONOSCOPY    . DILATATION & CURRETTAGE/HYSTEROSCOPY WITH RESECTOCOPE N/A 03/24/2013   Procedure: DILATATION & CURETTAGE, HYSTEROSCOPY WITH RESECTION;  Surgeon: Olga Millers, MD;  Location: [facility];  Service: Gynecology;  Laterality: N/A;    . HYSTEROSCOPY W/D&C N/A 11/08/2015   Procedure: DILATATION AND CURETTAGE /HYSTEROSCOPY;  Surgeon: Olga Millers, MD;  Location: [facility];  Service: Gynecology;  Laterality: N/A;  . left breast cyst removal  2000  . TUBAL LIGATION     Social History   Social History  . Marital status: Married    Spouse name: Devar Spells  . Number of children: 2  . 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 Smoking status: Never Smoker  . Smokeless tobacco: Never Used  . Alcohol use No  . Drug use: No  . Sexual activity: Yes    Birth control/ protection: Post-menopausal, Surgical   Other Topics Concern  . Not on file   Social History Narrative  . No narrative on file   Allergies  Allergen Reactions  . Gluten Meal Other (See Comments)    No wheat or soy  . Nsaids Nausea And Vomiting    Cannot tolerate large doses    Family History  Problem Relation Age of Onset  . Colon cancer Sister   . Diabetes Sister   . Diabetes Brother   . Heart disease Father   . Other Father     2 collapsed lungs    Past medical history, social, surgical and family history all reviewed in electronic medical record.  No pertanent information unless stated regarding to the chief complaint.   Review of Systems: No headache, visual changes, nausea, vomiting, diarrhea, constipation, dizziness, abdominal pain, skin rash, fevers, chills, night sweats, weight loss, swollen lymph nodes, body aches, joint swelling, muscle aches, chest pain, shortness of breath, mood changes.   Objective  There were no vitals taken for this visit.  General: No apparent distress alert and oriented x3 mood and affect normal, dressed appropriately. obese HEENT: Pupils equal, extraocular movements intact  Respiratory: Patient's speak in full sentences and does not appear short of breath  Cardiovascular: No lower extremity edema, non tender, no erythema  Skin: Warm  dry intact with no signs of infection or rash on extremities or on axial skeleton.  Abdomen: Soft nontender  Neuro: Cranial nerves II through XII are intact, neurovascularly intact in all extremities with 2+ DTRs and 2+ pulses.  Lymph: No lymphadenopathy of posterior or anterior cervical chain or axillae bilaterally.  Gait antalgic gait.  MSK:  Non tender with full range of motion and good stability and symmetric strength and tone of shoulders, elbows, wrist, hip, and ankles bilaterally. Mild arthritic changes of multiple joints Knee:Bilateral Valgus deformity of the knees bilaterally. Patient does have significant calf to thigh ratio Tenderness mostly over the medial joint line bilaterally Lacking the last 5 of extension as well as flexion bilaterally Instability noted with valgus force painful patellar compression. Patellar glide with mildcrepitus. Patellar and quadriceps tendons unremarkable. Hamstring and quadriceps strength is normal.  No significant change from presents exam  After informed written and verbal consent, patient was seated on exam table. Right knee was prepped with alcohol swab and utilizing anterolateral approach, patient's right knee space was injected with15 mg/2.5 mL of Orthovisc(sodium hyaluronate) in a prefilled syringe was injected easily into the knee through a 22-gauge needle.. Patient tolerated the procedure well without immediate complications.  After informed written and verbal consent, patient was seated on exam table. Left knee was prepped with alcohol swab and utilizing anterolateral approach, patient's left knee space was injected with 15 mg/2.5 mL of Orthovisc(sodium hyaluronate) in a prefilled syringe was injected easily into the knee through a 22-gauge needle... Patient tolerated the procedure well without immediate complications.    Impression and Recommendations:     This case required medical decision making of moderate complexity.      Note:  This dictation was prepared with Dragon dictation along with smaller phrase technology. Any transcriptional errors that result from this process are unintentional.

## 2016-02-01 NOTE — Assessment & Plan Note (Addendum)
Patient given fourth and final injection the knees bilaterally of the viscous supple mentation. Encourage her to continue with conservative therapy. Patient will follow-up again in 4-6 weeks for further evaluation and treatment.

## 2016-02-02 ENCOUNTER — Encounter: Payer: Self-pay | Admitting: Family Medicine

## 2016-02-02 ENCOUNTER — Ambulatory Visit (INDEPENDENT_AMBULATORY_CARE_PROVIDER_SITE_OTHER): Payer: Medicare Other | Admitting: Family Medicine

## 2016-02-02 DIAGNOSIS — M170 Bilateral primary osteoarthritis of knee: Secondary | ICD-10-CM

## 2016-02-02 NOTE — Patient Instructions (Addendum)
You are done!!!!! Jessica [name] is your friend  Keep active!  I think you are doing well.  See me again in 6 weeks if not better. Otherwise see me when you need me.

## 2016-02-06 ENCOUNTER — Other Ambulatory Visit: Payer: Self-pay | Admitting: Family Medicine

## 2016-02-07 NOTE — Telephone Encounter (Signed)
Refill done,

## 2016-02-15 ENCOUNTER — Ambulatory Visit (INDEPENDENT_AMBULATORY_CARE_PROVIDER_SITE_OTHER): Payer: Medicare Other | Admitting: Internal Medicine

## 2016-02-15 ENCOUNTER — Encounter: Payer: Self-pay | Admitting: Internal Medicine

## 2016-02-15 VITALS — BP 152/86 | HR 72 | Ht 65.0 in | Wt 257.0 lb

## 2016-02-15 DIAGNOSIS — K589 Irritable bowel syndrome without diarrhea: Secondary | ICD-10-CM | POA: Diagnosis not present

## 2016-02-15 DIAGNOSIS — K219 Gastro-esophageal reflux disease without esophagitis: Secondary | ICD-10-CM

## 2016-02-15 DIAGNOSIS — K5909 Other constipation: Secondary | ICD-10-CM

## 2016-02-15 DIAGNOSIS — R143 Flatulence: Secondary | ICD-10-CM | POA: Diagnosis not present

## 2016-02-15 DIAGNOSIS — K900 Celiac disease: Secondary | ICD-10-CM

## 2016-02-15 MED ORDER — FLUCONAZOLE 200 MG PO TABS: 200.0000 mg | ORAL_TABLET | Freq: Every day | ORAL | 0 refills | Status: DC

## 2016-02-15 MED ORDER — HYOSCYAMINE SULFATE 0.125 MG SL SUBL: 0.1250 mg | SUBLINGUAL_TABLET | SUBLINGUAL | 1 refills | Status: DC | PRN

## 2016-02-15 MED ORDER — POLYETHYLENE GLYCOL 3350 17 GM/SCOOP PO POWD: 1.0000 | Freq: Every day | ORAL | 1 refills | Status: DC

## 2016-02-15 MED ORDER — DEXLANSOPRAZOLE 60 MG PO CPDR: 60.0000 mg | DELAYED_RELEASE_CAPSULE | Freq: Every day | ORAL | 1 refills | Status: DC

## 2016-02-15 NOTE — Patient Instructions (Signed)
If you are age 65 or older, your body mass index should be between 23-30. Your Body mass index is 42.77 kg/m. If this is out of the aforementioned range listed, please consider follow up with your Primary Care Provider.  If you are age 58 or younger, your body mass index should be between 19-25. Your Body mass index is 42.77 kg/m. If this is out of the aformentioned range listed, please consider follow up with your Primary Care Provider.   We have sent medications to your pharmacy for you to pick up at your convenience.  Please purchase [employer] at your local pharmacy.  Please continue a Gluten free diet.  Please follow up in 1 year with Dr Hilarie Fredrickson.  Thank you.

## 2016-02-15 NOTE — Progress Notes (Signed)
Subjective:    Patient ID: Jessica [name], female    DOB: 1950-07-31, 65 y.o.   MRN: [id number]  HPI Kiylee Thoreson is a 65 year old African American female with history of celiac disease, GERD, IBS, hyperplastic colon polyps, family history of colorectal cancer in her sister who is here for follow-up. She was last seen by me on 06/30/2014. She reports overall she has been doing well though she did have issues with kidney stone causing mild right hydroureteronephrosis in September 2017. She was told she passed a stone but had a small kidney stone in her right kidney. She was having abdominal pain and nausea all associated with the kidney stone but this has improved/resolved.  From a GI perspective she follows a strict gluten-free diet. We tried to change her Dexilant to Nexium to control cost however she developed upset stomach. She switch back to Dexilant. This controls her reflux "extremely well". She denies heartburn, dysphagia or odynophagia. Denies dyspeptic symptoms. Continues MiraLAX 17 g daily which she takes at bedtime. She has 1-2 soft and formed stools daily. Her husband tells her that after some meals she eats she has a lot of gas at night. This doesn't cause her any symptoms but is embarrassing for her. She uses Levsin occasionally for crampy abdominal pain but this is quite rare. She previously used Librax but no longer has this medication.  Since her last visit she had a colonoscopy which I performed on 08/25/2014 which was negative for polyps. Five-year recall was recommended  Review of Systems As per history of present illness, otherwise negative  Current Medications, Allergies, Past Medical History, Past Surgical History, Family History and Social History were reviewed in [employer] record.     Objective:   Physical Exam BP (!) 152/86 (BP Location: Left Arm, Patient Position: Sitting, Cuff Size: Large)   Pulse 72   Ht 5' 5"  (1.651 m) Comment: height  measured without shoes  Wt 257 lb (116.6 kg)   BMI 42.77 kg/m  Constitutional: Well-developed and well-nourished. No distress. HEENT: Normocephalic and atraumatic. Oropharynx is clear and moist. Patchy very mild whitish lesions on tongue, no buccal mucosa thrush. Conjunctivae are normal.  No scleral icterus. Neck: Neck supple. Trachea midline. Cardiovascular: Normal rate, regular rhythm and intact distal pulses. No M/R/G Pulmonary/chest: Effort normal and breath sounds normal. No wheezing, rales or rhonchi. Abdominal: Soft, obese, nontender, nondistended. Bowel sounds active throughout. There are no masses palpable.  Extremities: no clubbing, cyanosis, or edema Lymphadenopathy: No cervical adenopathy noted. Neurological: Alert and oriented to person place and time. Skin: Skin is warm and dry.  Psychiatric: Normal mood and affect. Behavior is normal.  CBC    Component Value Date/Time   WBC 8.3 01/07/2016 1400   RBC 4.73 01/07/2016 1400   HGB 15.5 (H) 01/07/2016 1400   HCT 44.6 01/07/2016 1400   PLT 214 01/07/2016 1400   MCV 94.3 01/07/2016 1400   MCH 32.8 01/07/2016 1400   MCHC 34.8 01/07/2016 1400   RDW 13.0 01/07/2016 1400   LYMPHSABS 1.3 11/03/2015 1210   MONOABS 0.2 11/03/2015 1210   EOSABS 0.1 11/03/2015 1210   BASOSABS 0.0 11/03/2015 1210   CMP     Component Value Date/Time   NA 140 01/07/2016 1622   K 4.4 01/07/2016 1622   CL 109 01/07/2016 1622   CO2 14 (L) 01/07/2016 1622   GLUCOSE 211 (H) 01/07/2016 1622   BUN 22 (H) 01/07/2016 1622   CREATININE 1.05 (H) 01/07/2016 1622  CREATININE 0.83 04/04/2012 0832   CALCIUM 9.4 01/07/2016 1622   PROT 7.2 01/07/2016 1622   ALBUMIN 3.7 01/07/2016 1622   AST 28 01/07/2016 1622   ALT 20 01/07/2016 1622   ALKPHOS 92 01/07/2016 1622   BILITOT 0.5 01/07/2016 1622   GFRNONAA 55 (L) 01/07/2016 1622   GFRAA >60 01/07/2016 1622   CT abd/pevis without contrast reviewed today     Assessment & Plan:  65 year old African  American female with history of celiac disease, GERD, IBS, hyperplastic colon polyps, family history of colorectal cancer in her sister who is here for follow-up.   1. Celiac disease --  continue strict gluten-free diet. No alarm symptoms  2 GERD -- well-controlled Dexilant 60 mg daily. Continue current dose. Can use ranitidine every afternoon when necessary breakthrough  3. IBS/constipation -- well-controlled MiraLAX, and he 17 g daily. Levsin as needed  4. Family history of colon cancer -- no polyps a colonoscopy in 2016, repeat in 2021  5. Nocturnal gas -- I explained the gas is normal byproduct of digestion. Suggested she try over-the-counter Beano per box instruction and try to explain to her husband that this is not causing her any symptoms and it can be a normal process.  25 minutes spent with the patient today. Greater than 50% was spent in counseling and coordination of care with the patient 1 yr followup

## 2016-02-16 ENCOUNTER — Other Ambulatory Visit: Payer: Self-pay | Admitting: Endocrinology

## 2016-02-16 MED ORDER — POLYETHYLENE GLYCOL 3350 17 GM/SCOOP PO POWD: ORAL | 1 refills | Status: DC

## 2016-02-16 MED ORDER — FLUCONAZOLE 100 MG PO TABS: ORAL_TABLET | ORAL | 0 refills | Status: DC

## 2016-02-16 NOTE — Addendum Note (Signed)
Addended by: Larina Bras on: 02/16/2016 08:53 AM   Modules accepted: Orders

## 2016-02-24 ENCOUNTER — Other Ambulatory Visit: Payer: Self-pay | Admitting: Endocrinology

## 2016-03-13 ENCOUNTER — Other Ambulatory Visit: Payer: Self-pay | Admitting: Endocrinology

## 2016-03-14 ENCOUNTER — Telehealth: Payer: Self-pay | Admitting: Endocrinology

## 2016-03-14 DIAGNOSIS — N201 Calculus of ureter: Secondary | ICD-10-CM

## 2016-03-14 NOTE — Telephone Encounter (Signed)
Done

## 2016-03-14 NOTE — Telephone Encounter (Signed)
See message and please advise on how to proceed. Thanks!  

## 2016-03-14 NOTE — Telephone Encounter (Signed)
Jessica [name] see message  I contacted the patient and advised referral has been placed.

## 2016-03-14 NOTE — Telephone Encounter (Signed)
Patient has another kidney stone, and is really bad pain.  Patient need a referral to a urologist soon. asap

## 2016-03-15 NOTE — Progress Notes (Signed)
[name] [address] [place], [place] [address] Phone: [phone] Subjective:     CC:  Bilateral knee pain Follow-up  [id number]  Jessica [name] is a 65 y.o. female coming in with complaint of bilateral knee pain. 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  Patient finished viscous supplementation 6 weeks ago. Patient was to continue conservative therapy. Patient states The right knee is feeling significantly better but unfortunately the left knee has not. Patient feels like she is having some weakness in the right knee which then makes her unfortunately more weight on the left knee. Patient states that this is affecting daily activities such as standing for long amount of time. Having increasing instability of the left knee. States that it feels like it is swelling by the end of the week. States that when she does not work it feels a little bit better.    Past Medical History:  Diagnosis Date  . Anemia    borderline  . Anxiety   . Arthritis    bilateral knees  . Asthma    childhood  . Autonomic neuropathy    diabetic  . Cataract   . Celiac disease   . Chest pain    intermittant left sided chest pain, non-exertional, lasting 1-2 min  . Depression   . Diabetes mellitus    type II, Hemoglobin A1C 9.9 10/05/2011  . Diabetic neuropathy ([place])   . Diverticulosis   . Fatty liver   . Gastroparesis   . GERD (gastroesophageal reflux disease)   . Heart murmur   . Hemorrhoids   . Hypertension   . IBS (irritable bowel syndrome)   . Narrowing of airway   . Nasal congestion current   sore throat, ears-on antibiotic  . Personal history of colonic polyps 03/2010   hyperplastic  . Pneumonia 11/03/2015   current  . PONV (postoperative nausea and vomiting)   . Shortness of breath dyspnea   . Unspecified gastritis and gastroduodenitis  without mention of hemorrhage    Past Surgical History:  Procedure Laterality Date  . APPENDECTOMY    . COLONOSCOPY    . DILATATION & CURRETTAGE/HYSTEROSCOPY WITH RESECTOCOPE N/A 03/24/2013   Procedure: DILATATION & CURETTAGE, HYSTEROSCOPY WITH RESECTION;  Surgeon: Olga Millers, MD;  Location: [facility];  Service: Gynecology;  Laterality: N/A;  . HYSTEROSCOPY W/D&C N/A 11/08/2015   Procedure: DILATATION AND CURETTAGE /HYSTEROSCOPY;  Surgeon: Olga Millers, MD;  Location: [facility];  Service: Gynecology;  Laterality: N/A;  . left breast cyst removal  2000  . TUBAL LIGATION     Social History   Social History  . Marital status: Married    Spouse name: Devar Bartelson  . Number of children: 2  . 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 Smoking status: Never Smoker  . Smokeless tobacco: Never Used  . Alcohol use No  . Drug use: No  . Sexual activity: Yes    Birth control/ protection: Post-menopausal, Surgical   Other Topics Concern  . None   Social History Narrative  . None   Allergies  Allergen Reactions  . Gluten Meal Other (See Comments)    No wheat or soy  . Nsaids Nausea And Vomiting    Cannot tolerate large doses    Family History  Problem  Relation Age of Onset  . Colon cancer Sister   . Diabetes Sister   . Diabetes Brother   . Heart disease Father   . Other Father     2 collapsed lungs    Past medical history, social, surgical and family history all reviewed in electronic medical record.  No pertanent information unless stated regarding to the chief complaint.   Review of Systems: No headache, visual changes, nausea, vomiting, diarrhea, constipation, dizziness, abdominal pain, skin rash, fevers, chills, night sweats, weight loss, swollen lymph nodes, body aches, joint swelling, muscle aches, chest pain, shortness of breath, mood changes.   Objective  Blood pressure 136/78, pulse 82,  height 5' 5"  (1.651 m), weight 256 lb (116.1 kg), SpO2 97 %.  Systems examined below as of 03/16/16 General: NAD A&O x3 mood, affect normal  HEENT: Pupils equal, extraocular movements intact no nystagmus Respiratory: not short of breath at rest or with speaking Cardiovascular: No lower extremity edema, non tender Skin: Warm dry intact with no signs of infection or rash on extremities or on axial skeleton. Abdomen: Soft nontender, no masses Neuro: Cranial nerves  intact, neurovascularly intact in all extremities with 2+ DTRs and 2+ pulses. Lymph: No lymphadenopathy appreciated today  Gait antalgic gait.  MSK:  Non tender with full range of motion and good stability and symmetric strength and tone of shoulders, elbows, wrist, hip, and ankles bilaterally. Mild arthritic changes of multiple joints Knee:Bilateral Valgus deformity of the knees bilaterally. Patient does have significant calf to thigh ratio Tenderness mostly over the medial joint line left knee more than the right Lacking the last 5 of extension as well as flexion only on the left knee with improvement on the right Instability noted with valgus force bilaterally painful patellar compression. Patellar glide with mildcrepitus. Patellar and quadriceps tendons unremarkable. Hamstring and quadriceps strength is normal.  Improvement in the right knee but worse on the left knee ... Patient tolerated the procedure well without immediate complications.    Impression and Recommendations:     This case required medical decision making of moderate complexity.      Note: This dictation was prepared with Dragon dictation along with smaller phrase technology. Any transcriptional errors that result from this process are unintentional.

## 2016-03-16 ENCOUNTER — Ambulatory Visit (INDEPENDENT_AMBULATORY_CARE_PROVIDER_SITE_OTHER): Payer: Medicare Other | Admitting: Family Medicine

## 2016-03-16 ENCOUNTER — Encounter: Payer: Self-pay | Admitting: Family Medicine

## 2016-03-16 DIAGNOSIS — M170 Bilateral primary osteoarthritis of knee: Secondary | ICD-10-CM | POA: Diagnosis not present

## 2016-03-16 NOTE — Patient Instructions (Signed)
Good to see you  We will get you a brace for the left knee.  Keep trucking along Continue the tylenol 3 times a day  Add ibuprofen when you need it.  If worsening consider [facility] Camel for the knee replacement  You know where I am at if you need me.

## 2016-03-16 NOTE — Assessment & Plan Note (Signed)
Discussed with patient again at great length. Patient is on the understanding that the next step would be surgicale intervention knee replacement Patient would like to avoid that at this t Questionsime. Patient will be fitted for custom brace to give her moe stability of discussed the left knee for  declined declinedshort course of time. Patient understands that she'll consider actually having a replacement within the next year. We dicussed that we can possibly do forma physical therapy for strengthening or possible injections or can which patient declined. We discussed icing regimen and continuing the medications at this moment.Worsening pain patient will follow. with patient at great length and answered all question . Otherwise patient will follow-up as needed.  Spent  25 minutes with patient face-to-face and had greater than 50% of counseling including as described above in assessment and plan.

## 2016-03-20 ENCOUNTER — Ambulatory Visit (INDEPENDENT_AMBULATORY_CARE_PROVIDER_SITE_OTHER): Payer: Medicare Other | Admitting: Allergy & Immunology

## 2016-03-20 ENCOUNTER — Encounter: Payer: Self-pay | Admitting: Allergy & Immunology

## 2016-03-20 VITALS — BP 150/80 | HR 81 | Temp 98.6°F | Resp 18 | Ht 64.5 in | Wt 254.2 lb

## 2016-03-20 DIAGNOSIS — K219 Gastro-esophageal reflux disease without esophagitis: Secondary | ICD-10-CM

## 2016-03-20 DIAGNOSIS — T781XXD Other adverse food reactions, not elsewhere classified, subsequent encounter: Secondary | ICD-10-CM

## 2016-03-20 DIAGNOSIS — J3089 Other allergic rhinitis: Secondary | ICD-10-CM

## 2016-03-20 DIAGNOSIS — J4530 Mild persistent asthma, uncomplicated: Secondary | ICD-10-CM

## 2016-03-20 DIAGNOSIS — K141 Geographic tongue: Secondary | ICD-10-CM | POA: Diagnosis not present

## 2016-03-20 NOTE — Progress Notes (Signed)
NEW PATIENT  Date of Service/Encounter:  03/20/16   Assessment:   Chronic nonseasonal allergic rhinitis due to fungal spores  Mild persistent asthma, uncomplicated  Gastroesophageal reflux disease, esophagitis presence not specified  Geographic tongue  Adverse food reaction, subsequent encounter   Asthma Reportables:  Severity: mild persistent  Risk: low Control: not well controlled  Seasonal Influenza Vaccine: yes    Plan/Recommendations:    1. Chronic allergic rhinitis - Testing was positive to multiple molds, grasses, weeds, trees, and cat - Start Flonase two sprays per nostril once daily. - If this is not controlling your postansal drip, you can increase up to two sprays per nostril twice daily. - You can use cetirizine (Zyrtec) 59m for breakthrough symptoms.  - Avoidance measures discussed.   2. Mild persistent asthma, uncomplicated - Lung testing showed evidence of obstruction, which is consistent with asthma. - There was evidence of reversibility with albuterol, which again is consistent with asthma. - Daily controller medication(s): Arnuity 2041m one inhalation once daily - Rescue medications: ProAir 4 puffs every 4-6 hours as needed - Asthma control goals:  * Full participation in all desired activities (may need albuterol before activity) * Albuterol use two time or less a week on average (not counting use with activity) * Cough interfering with sleep two time or less a month * Oral steroids no more than once a year * No hospitalizations  3. Gastroesophageal reflux disease, esophagitis presence not specified - Continue with your Dexilant since this seems to be helping. - Ms. Evrard would prefer a medication that was less expensive, but she has trialed other PPIs without improvement of her symptoms.  - Testing for all of the most common foods, including cow's milk, was negative.   4. Return in about 4 weeks (around 04/17/2016).  Subjective:    ShAkhila Mahnkencales is a [age].o. female presenting today for evaluation of  Chief Complaint  Patient presents with  . Sinus Problem    continously draining; if pt clears throat it helps  .  ShCabrini Ruggiericales has a history of the following: Patient Active Problem List   Diagnosis Date Noted  . Urolithiasis 01/13/2016  . Wellness examination 11/03/2015  . Dyspnea 11/03/2015  . Other nonspecific abnormal finding of lung field 11/03/2015  . Degenerative arthritis of knee, bilateral 10/29/2015  . Left shoulder pain 08/02/2015  . Cervical disc disorder with radiculopathy of cervical region 08/02/2015  . Degenerative cervical disc 07/27/2015  . Screening examination for infectious disease 12/19/2013  . Primary localized osteoarthrosis, lower leg 09/05/2013  . Comedone 06/07/2013  . Menopausal state 05/21/2013  . Nonallopathic lesion of lumbar region 02/20/2013  . Nonallopathic lesion of sacral region 02/20/2013  . Nonallopathic lesion of thoracic region 02/20/2013  . Low back pain 12/20/2012  . Encounter for long-term (current) use of other medications 04/04/2012  . Dysuria 01/04/2012  . Chest pain 10/05/2011  . Rash 02/28/2011  . OSA (obstructive sleep apnea) 02/28/2011  . Constipation, slow transit 09/23/2010  . Dyspepsia and other specified disorders of function of stomach 09/23/2010  . DM (diabetes mellitus) ([place]05/25/2012  . FH: colon cancer 09/23/2010  . Personal history of colonic polyps 09/23/2010  . Plantar fasciitis 08/17/2010  . Acute upper respiratory infections of unspecified site 07/22/2010  . SINUSITIS, ACUTE 05/28/2010  . EDEMA 12/20/2009  . SHOULDER DISLOCATION 12/20/2009  . NUMBNESS 10/07/2009  . OTITIS EXTERNA, ACUTE 04/01/2009  . HYPERCHOLESTEROLEMIA 01/29/2009  . CHEST PAIN UNSPECIFIED 01/14/2009  . ABDOMINAL  PAIN 01/14/2009  . CONSTIPATION 11/05/2008  . LEUKOPENIA, CHRONIC 04/01/2008  . ASYMPTOMATIC POSTMENOPAUSAL STATUS 04/01/2008  . SKIN DISORDER  12/26/2007  . CRAMP IN LIMB 09/26/2007  . NECK MASS 09/26/2007  . Allergic rhinitis 06/21/2007  . LEG PAIN, BILATERAL 06/21/2007  . GASTRITIS 02/27/2007  . GASTROPARESIS 02/27/2007  . FOREIGN BODY IN DIGESTIVE SYSTEM UNSPECIFIED 02/27/2007  . Diabetes ([place]) 10/22/2006  . AUTONOMIC NEUROPATHY, DIABETIC 10/22/2006  . ANEMIA-IRON DEFICIENCY 10/22/2006  . Essential hypertension 10/22/2006  . HEMORRHOIDS 10/22/2006  . ASTHMA, CHILDHOOD 10/22/2006    History obtained from: chart review and patient, who is not a great historian  Mischele Detter Brander was referred by Renato Shin, MD.     Edna is a 65 y.o. female presenting for chronic postnasal drip and "gurgling in her throat". She wanted to go to an ENT specialist but she ended up here instead. This postnasal drip has been worsening over the course of the year or so as she has gotten older. Symptoms seem to be worse at night. There is no seasonal predilection of her symptoms. There are no exacerbating factors in her history. She has been using oral antihistamines to help with her symptoms. However she has not been using any nasal steroids or nasal antihistamines.   Ms. Tessier has a history of reflux. She is on both a PPI (Dexilant) as well as an H2 blocker for her GERD. She does see a Copywriter, [employer]. With the PPI and ranitidine, she feels that her symptoms are fairly well controlled. She has tried using omeprazole or other PPIs but these have proven to be less effective. She also has a history of Celiac disease and avoids gluten in all forms. She denies known food allergies but she does have problems with cow's milk which causes abdominal pain.   Ms. Liberati does not carry a diagnosis of asthma but upon further discussion it seems that she is on an Advair Diskus (unknown strength), which she is using only as needed. She likes the mechanism but feels that the medication coats her tongue more than anything else. It is difficult to get a sense of how  often she is using her Advair. She denies ED visits or prednisone courses for her asthma. Symptoms tend to be worse at night and she reports feeling "stuffy and winded" at night. She did have a sleep study performed around three years ago that did show some mild obstruction, but the physician just told her that she "was fat", she reports with a laugh. She did not have sleep apnea and does not use a CPAP machine.   Ms. Elmore has a history of type 2 diabetes and has a "diabetic tongue". Her PCP diagnosed her with oral thrush on an occasion or two. The tongue seemed to improve with the use of fluconazole but then returned to its normal state. She also has a history of back pain and shoulder pain.   Otherwise, there is no history of other atopic diseases, including drug allergies, food allergies, stinging insect allergies, or urticaria. There is no significant infectious history. Vaccinations are up to date.    Past Medical History: Patient Active Problem List   Diagnosis Date Noted  . Urolithiasis 01/13/2016  . Wellness examination 11/03/2015  . Dyspnea 11/03/2015  . Other nonspecific abnormal finding of lung field 11/03/2015  . Degenerative arthritis of knee, bilateral 10/29/2015  . Left shoulder pain 08/02/2015  . Cervical disc disorder with radiculopathy of cervical region 08/02/2015  . Degenerative cervical disc  07/27/2015  . Screening examination for infectious disease 12/19/2013  . Primary localized osteoarthrosis, lower leg 09/05/2013  . Comedone 06/07/2013  . Menopausal state 05/21/2013  . Nonallopathic lesion of lumbar region 02/20/2013  . Nonallopathic lesion of sacral region 02/20/2013  . Nonallopathic lesion of thoracic region 02/20/2013  . Low back pain 12/20/2012  . Encounter for long-term (current) use of other medications 04/04/2012  . Dysuria 01/04/2012  . Chest pain 10/05/2011  . Rash 02/28/2011  . OSA (obstructive sleep apnea) 02/28/2011  . Constipation, slow transit  09/23/2010  . Dyspepsia and other specified disorders of function of stomach 09/23/2010  . DM (diabetes mellitus) ([place]) 09/23/2010  . FH: colon cancer 09/23/2010  . Personal history of colonic polyps 09/23/2010  . Plantar fasciitis 08/17/2010  . Acute upper respiratory infections of unspecified site 07/22/2010  . SINUSITIS, ACUTE 05/28/2010  . EDEMA 12/20/2009  . SHOULDER DISLOCATION 12/20/2009  . NUMBNESS 10/07/2009  . OTITIS EXTERNA, ACUTE 04/01/2009  . HYPERCHOLESTEROLEMIA 01/29/2009  . CHEST PAIN UNSPECIFIED 01/14/2009  . ABDOMINAL PAIN 01/14/2009  . CONSTIPATION 11/05/2008  . LEUKOPENIA, CHRONIC 04/01/2008  . ASYMPTOMATIC POSTMENOPAUSAL STATUS 04/01/2008  . SKIN DISORDER 12/26/2007  . CRAMP IN LIMB 09/26/2007  . NECK MASS 09/26/2007  . Allergic rhinitis 06/21/2007  . LEG PAIN, BILATERAL 06/21/2007  . GASTRITIS 02/27/2007  . GASTROPARESIS 02/27/2007  . FOREIGN BODY IN DIGESTIVE SYSTEM UNSPECIFIED 02/27/2007  . Diabetes ([place]) 10/22/2006  . AUTONOMIC NEUROPATHY, DIABETIC 10/22/2006  . ANEMIA-IRON DEFICIENCY 10/22/2006  . Essential hypertension 10/22/2006  . HEMORRHOIDS 10/22/2006  . ASTHMA, CHILDHOOD 10/22/2006    Medication List:    Medication List       Accurate as of 03/20/16  9:14 PM. Always use your most recent med list.          atorvastatin 40 MG tablet Commonly known as:  LIPITOR TAKE 1 TABLET (40 MG TOTAL) BY MOUTH DAILY.   B-D INS SYR ULTRAFINE 1CC/31G 31G X 5/16" 1 ML Misc Generic drug:  Insulin Syringe-Needle U-100 USE UP TO 3 A DAY   B-D INS SYR ULTRAFINE 1CC/31G 31G X 5/16" 1 ML Misc Generic drug:  Insulin Syringe-Needle U-100 USE UP TO 3 A DAY   clotrimazole 10 MG troche Commonly known as:  MYCELEX Take 1 tablet (10 mg total) by mouth 5 (five) times daily.   dexlansoprazole 60 MG capsule Commonly known as:  DEXILANT Take 1 capsule (60 mg total) by mouth daily.   Fluticasone-Salmeterol 100-50 MCG/DOSE Aepb Commonly known as:   ADVAIR Inhale 1 puff into the lungs 2 (two) times daily.   gabapentin 300 MG capsule Commonly known as:  NEURONTIN TAKE 1 CAPSULE BY MOUTH 3 TIMES DAILY   glucose blood test strip Commonly known as:  ONE TOUCH ULTRA TEST USE TO TEST BLOOD SUGAR 2 TIMES DAILY. DX: E11.43   hyoscyamine 0.125 MG SL tablet Commonly known as:  LEVSIN SL Take 1 tablet (0.125 mg total) by mouth every 4 (four) hours as needed.   insulin NPH-regular Human (70-30) 100 UNIT/ML injection Commonly known as:  HUMULIN 70/30 INJECT 110 UNITS WITH BREAKFAST, AND 50 UNITS WITH THE EVENING MEAL   polyethylene glycol powder powder Commonly known as:  GLYCOLAX/MIRALAX Take 17 grams (1 capful) dissolved in at least 8 ounces water/juice once daily (pharmacy please d/c rx for 255 g daily sent in error)   ranitidine 150 MG tablet Commonly known as:  ZANTAC TAKE 1 TABLET (150 MG TOTAL) BY MOUTH AT BEDTIME AS NEEDED FOR HEARTBURN.  valACYclovir 500 MG tablet Commonly known as:  VALTREX Take 500 mg by mouth daily.   valACYclovir 500 MG tablet Commonly known as:  VALTREX TAKE 1 TABLET BY MOUTH EVERY DAY   valsartan 320 MG tablet Commonly known as:  DIOVAN TAKE 1 TABLET BY MOUTH EVERY DAY       Birth History: non-contributory. Born at term without complications.   Developmental History: Alane has met all milestones on time. She has required no speech therapy, occupational therapy, or physical therapy.   Past Surgical History: Past Surgical History:  Procedure Laterality Date  . APPENDECTOMY    . COLONOSCOPY    . DILATATION & CURRETTAGE/HYSTEROSCOPY WITH RESECTOCOPE N/A 03/24/2013   Procedure: DILATATION & CURETTAGE, HYSTEROSCOPY WITH RESECTION;  Surgeon: Olga Millers, MD;  Location: [facility];  Service: Gynecology;  Laterality: N/A;  . HYSTEROSCOPY W/D&C N/A 11/08/2015   Procedure: DILATATION AND CURETTAGE /HYSTEROSCOPY;  Surgeon: Olga Millers, MD;  Location: [facility];  Service: Gynecology;  Laterality:  N/A;  . left breast cyst removal  2000  . TUBAL LIGATION       Family History: Family History  Problem Relation Age of Onset  . Colon cancer Sister   . Diabetes Sister   . Diabetes Brother   . Heart disease Father   . Other Father     2 collapsed lungs     Social History: Ameliyah lives at home with her husband, who is a [place] War veteran affected by [employer]. They have no pets and no children. The patient has spent much of her time caring for her family, including two sisters that have passed as well as her mother who has recently passed as well. She worked for [employer] for 22 years and then retired. 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    Review of Systems: a 14-point review of systems is pertinent for what is mentioned in HPI.  Otherwise, all other systems were negative. Constitutional: negative other than that listed in the HPI Eyes: negative other than that listed in the HPI Ears, nose, mouth, throat, and face: negative other than that listed in the HPI Respiratory: negative other than that listed in the HPI Cardiovascular: negative other than that listed in the HPI Gastrointestinal: negative other than that listed in the HPI Genitourinary: negative other than that listed in the HPI Integument: negative other than that listed in the HPI Hematologic: negative other than that listed in the HPI Musculoskeletal: negative other than that listed in the HPI Neurological: negative other than that listed in the HPI Allergy/Immunologic: negative other than that listed in the HPI    Objective:   Blood pressure (!) 150/80, pulse 81, temperature 98.6 F (37 C), temperature source Oral, resp. rate 18, height 5' 4.5" (1.638 m), weight 254 lb 3.2 oz (115.3 kg), SpO2 96 %. Body mass index is 42.96 kg/m.   Physical Exam:  General: Alert, interactive,  in no acute distress. Cooperative with the exam. Very friendly although guarded at first. HEENT: TMs pearly gray, turbinates edematous and pale without discharge, post-pharynx erythematous. Neck: Supple without thyromegaly. Adenopathy: no enlarged lymph nodes appreciated in the anterior cervical, occipital, axillary, epitrochlear, inguinal, or popliteal regions Lungs: Clear to auscultation without wheezing, rhonchi or rales. No increased work of breathing. CV: Normal S1/S2, no murmurs. Capillary refill <2 seconds.  Abdomen: Nondistended, nontender.  No guarding or rebound tenderness. Bowel sounds present in all fields and hypoactive  Skin: Warm and dry, without lesions or rashes. No eczematous lesions noted.  Extremities:  No clubbing, cyanosis or edema.  Neuro:   Grossly intact. No deficits noted.   Diagnostic studies:  Spirometry: results abnormal (FEV1: 1.63/80%, FVC: 2.41/94%, FEV1/FVC: 67%).    Spirometry consistent with mild obstructive disease. DuoNeb nebulizer treatment given in clinic with significant improvement in only the FEF 25-75 percent, which increased 588%. Her FVC and FEV1 did not increase. Her ratio increased to 84% from 67%, which is likely related to the decrease in her FVC from 94% to 76%.   Allergy Studies:   Indoor/Outdoor Percutaneous Adult Environmental Panel: positive to Fusarium and Mucor with adequate controls  Indoor/Outdoor Selected Intradermal Environmental Panel: positive to [employer] grass, grass mix, wheat mix, tree mix, mold mix 2, and cast with adequate controls  Most Common Foods Panel (peanut, tree nut, soy, fish mix, shellfish mix, wheat, milk, egg): equivocal to casein, but otherwise negative with adequate controls     Salvatore Marvel, MD [facility]

## 2016-03-20 NOTE — Patient Instructions (Addendum)
1. Chronic allergic rhinitis due to fungal spores - Testing was positive to multiple molds, grasses, weeds, trees, and cat - Start Flonase two sprays per nostril once daily. - If this is not controlling your postansal drip, you can increase up to two sprays per nostril twice daily. - You can use cetirizine (Zyrtec) 96m for breakthrough symptoms.   2. Mild persistent asthma, uncomplicated - Lung testing showed evidence of obstruction, which is consistent with asthma. - You did have reversibility with albuterol, which again is consistent with asthma. - Daily controller medication(s): Arnuity 2030m one inhalation once daily - Rescue medications: ProAir 4 puffs every 4-6 hours as needed - Asthma control goals:  * Full participation in all desired activities (may need albuterol before activity) * Albuterol use two time or less a week on average (not counting use with activity) * Cough interfering with sleep two time or less a month * Oral steroids no more than once a year * No hospitalizations  3. Gastroesophageal reflux disease, esophagitis presence not specified - Continue with your Dexilant since this seems to be helping.  4. Return in about 4 weeks (around 04/17/2016).  Please inform usKoreaf any Emergency Department visits, hospitalizations, or changes in symptoms. Call usKoreaefore going to the ED for breathing or allergy symptoms since we might be able to fit you in for a sick visit. Feel free to contact usKoreanytime with any questions, problems, or concerns.  It was a pleasure to meet you today! Happy Thanksgiving!   Websites that have reliable patient information: 1. American Academy of Asthma, Allergy, and Immunology: www.aaaai.org 2. Food Allergy Research and Education (FARE): foodallergy.org 3. Mothers of Asthmatics: http://www.asthmacommunitynetwork.org 4. American College of Allergy, Asthma, and Immunology: www.acaai.org  Control of Mold Allergen  Mold and fungi can grow on a  variety of surfaces provided certain temperature and moisture conditions exist.  Outdoor molds grow on plants, decaying vegetation and soil.  The major outdoor mold, Alternaria and Cladosporium, are found in very high numbers during hot and dry conditions.  Generally, a late Summer - Fall peak is seen for common outdoor fungal spores.  Rain will temporarily lower outdoor mold spore count, but counts rise rapidly when the rainy period ends.  The most important indoor molds are Aspergillus and Penicillium.  Dark, humid and poorly ventilated basements are ideal sites for mold growth.  The next most common sites of mold growth are the bathroom and the kitchen.  Outdoor [employer]. Use air conditioning and keep windows closed 2. Avoid exposure to decaying vegetation. 3. Avoid leaf raking. 4. Avoid grain handling. 5. Consider wearing a face mask if working in moldy areas.  Indoor Mold Control 1. Maintain humidity below 50%. 2. Clean washable surfaces with 5% bleach solution. 3. Remove sources e.g. contaminated carpets.  Reducing Pollen Exposure  The American Academy of Allergy, Asthma and Immunology suggests the following steps to reduce your exposure to pollen during allergy seasons.    1. Do not hang sheets or clothing out to dry; pollen may collect on these items. 2. Do not mow lawns or spend time around freshly cut grass; mowing stirs up pollen. 3. Keep windows closed at night.  Keep car windows closed while driving. 4. Minimize morning activities outdoors, a time when pollen counts are usually at their highest. 5. Stay indoors as much as possible when pollen counts or humidity is high and on windy days when pollen tends to remain in the air longer. 6. Use  air conditioning when possible.  Many air conditioners have filters that trap the pollen spores. 7. Use a HEPA room air filter to remove pollen form the indoor air you breathe.  Control of Dog or Cat Allergen  Avoidance is the best  way to manage a dog or cat allergy. If you have a dog or cat and are allergic to dog or cats, consider removing the dog or cat from the home. If you have a dog or cat but don't want to find it a new home, or if your family wants a pet even though someone in the household is allergic, here are some strategies that may help keep symptoms at bay:  1. Keep the pet out of your bedroom and restrict it to only a few rooms. Be advised that keeping the dog or cat in only one room will not limit the allergens to that room. 2. Don't pet, hug or kiss the dog or cat; if you do, wash your hands with soap and water. 3. High-efficiency particulate air (HEPA) cleaners run continuously in a bedroom or living room can reduce allergen levels over time. 4. Regular use of a high-efficiency vacuum cleaner or a central vacuum can reduce allergen levels. 5. Giving your dog or cat a bath at least once a week can reduce airborne allergen.

## 2016-03-21 MED ORDER — FLUTICASONE FUROATE 200 MCG/ACT IN AEPB: 200.0000 ug | INHALATION_SPRAY | Freq: Every day | RESPIRATORY_TRACT | 5 refills | Status: DC

## 2016-04-04 DIAGNOSIS — R109 Unspecified abdominal pain: Secondary | ICD-10-CM | POA: Diagnosis not present

## 2016-04-04 DIAGNOSIS — N200 Calculus of kidney: Secondary | ICD-10-CM | POA: Diagnosis not present

## 2016-04-12 ENCOUNTER — Other Ambulatory Visit: Payer: Self-pay | Admitting: Internal Medicine

## 2016-04-17 ENCOUNTER — Other Ambulatory Visit: Payer: Self-pay | Admitting: Internal Medicine

## 2016-04-19 ENCOUNTER — Other Ambulatory Visit: Payer: Self-pay | Admitting: Internal Medicine

## 2016-04-26 ENCOUNTER — Ambulatory Visit: Payer: 59 | Admitting: Allergy & Immunology

## 2016-05-07 NOTE — Progress Notes (Signed)
Subjective:    Patient ID: Jessica [name], female    DOB: [birth year], 66 y.o.   MRN: [id number]  HPI Pt returns for f/u of diabetes mellitus:  DM type: Insulin-requiring type 2. Dx'ed: 2000.  Complications: polyneuropathy and autonomic neuropathy.   Therapy: insulin since 2005 GDM: never DKA: never Severe hypoglycemia: never.  Pancreatitis: never.  Other: Pt says her ability to care for her DM continues to be compromised by her husband's illness; she has done better on a BID insulin regimen; she takes human insulin, due to cost.   Interval history: no cbg record, but states cbg's are well-controlled.  Pt states 4 days of slight pain at the throat, and assoc bilat otalgia. Past Medical History:  Diagnosis Date  . Anemia    borderline  . Anxiety   . Arthritis    bilateral knees  . Asthma    childhood  . Autonomic neuropathy    diabetic  . Cataract   . Celiac disease   . Chest pain    intermittant left sided chest pain, non-exertional, lasting 1-2 min  . Depression   . Diabetes mellitus    type II, Hemoglobin A1C 9.9 10/05/2011  . Diabetic neuropathy ([place])   . Diverticulosis   . Fatty liver   . Gastroparesis   . GERD (gastroesophageal reflux disease)   . Heart murmur   . Hemorrhoids   . Hypertension   . IBS (irritable bowel syndrome)   . Narrowing of airway   . Nasal congestion current   sore throat, ears-on antibiotic  . Personal history of colonic polyps 03/2010   hyperplastic  . Pneumonia 11/03/2015   current  . PONV (postoperative nausea and vomiting)   . Recurrent upper respiratory infection (URI)    bronchitis  . Shortness of breath dyspnea   . Unspecified gastritis and gastroduodenitis without mention of hemorrhage     Past Surgical History:  Procedure Laterality Date  . APPENDECTOMY    . COLONOSCOPY    . DILATATION & CURRETTAGE/HYSTEROSCOPY WITH RESECTOCOPE N/A 03/24/2013   Procedure: DILATATION & CURETTAGE, HYSTEROSCOPY WITH RESECTION;  Surgeon:  Olga Millers, MD;  Location: [facility];  Service: Gynecology;  Laterality: N/A;  . HYSTEROSCOPY W/D&C N/A 11/08/2015   Procedure: DILATATION AND CURETTAGE /HYSTEROSCOPY;  Surgeon: Olga Millers, MD;  Location: [facility];  Service: Gynecology;  Laterality: N/A;  . left breast cyst removal  2000  . TUBAL LIGATION      Social History   Social History  . Marital status: Married    Spouse name: Devar Appleman  . Number of children: 2  . 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 Smoking status: Never Smoker  . Smokeless tobacco: Never Used  . Alcohol use No  . Drug use: No  . Sexual activity: Yes    Birth control/ protection: Post-menopausal, Surgical   Other Topics Concern  . Not on file   Social History Narrative  . No narrative on file    Current Outpatient Prescriptions on File Prior to Visit  Medication Sig Dispense Refill  . B-D INS SYR ULTRAFINE 1CC/31G 31G X 5/16" 1 ML MISC USE UP TO 3 A DAY 90 each 11  . DEXILANT 60 MG capsule TAKE 1 CAPSULE (60 MG TOTAL) BY MOUTH DAILY. 30 capsule 7  . gabapentin (NEURONTIN) 300 MG capsule TAKE 1 CAPSULE BY MOUTH 3 TIMES DAILY 270 capsule  1  . glucose blood (ONE TOUCH ULTRA TEST) test strip USE TO TEST BLOOD SUGAR 2 TIMES DAILY. DX: E11.43 100 each 4  . hyoscyamine (LEVSIN SL) 0.125 MG SL tablet Take 1 tablet (0.125 mg total) by mouth every 4 (four) hours as needed. 30 tablet 1  . insulin NPH-regular Human (HUMULIN 70/30) (70-30) 100 UNIT/ML injection INJECT 110 UNITS WITH BREAKFAST, AND 50 UNITS WITH THE EVENING MEAL 60 mL 5  . polyethylene glycol powder (GLYCOLAX/MIRALAX) powder TAKE 1 CAPFUL DISSOLVE IN AT LEAST 8OZ OF WATER/JUICE ONCE DAILY 527 g 3  . ranitidine (ZANTAC) 150 MG tablet TAKE 1 TABLET (150 MG TOTAL) BY MOUTH AT BEDTIME AS NEEDED FOR HEARTBURN. 30 tablet 0  . valACYclovir (VALTREX) 500 MG tablet Take 500 mg by mouth daily.  3  . valsartan (DIOVAN) 320  MG tablet TAKE 1 TABLET BY MOUTH EVERY DAY 30 tablet 2  . clotrimazole (MYCELEX) 10 MG troche Take 1 tablet (10 mg total) by mouth 5 (five) times daily. (Patient not taking: Reported on 05/10/2016) 30 tablet 1  . Fluticasone Furoate (ARNUITY ELLIPTA) 200 MCG/ACT AEPB Inhale 200 mcg into the lungs daily. (Patient not taking: Reported on 05/10/2016) 30 each 5  . Fluticasone-Salmeterol (ADVAIR) 100-50 MCG/DOSE AEPB Inhale 1 puff into the lungs 2 (two) times daily. (Patient not taking: Reported on 05/10/2016) 1 each 3   No current facility-administered medications on file prior to visit.     Allergies  Allergen Reactions  . Gluten Meal Other (See Comments)    No wheat or soy  . Nsaids Nausea And Vomiting    Cannot tolerate large doses     Family History  Problem Relation Age of Onset  . Colon cancer Sister   . Diabetes Sister   . Diabetes Brother   . Heart disease Father   . Other Father     2 collapsed lungs    BP (!) 142/88   Pulse 77   Temp 98.2 F (36.8 C) (Oral)   Ht 5' 4.5" (1.638 m)   Wt 261 lb (118.4 kg)   SpO2 97%   BMI 44.11 kg/m   Review of Systems She denies hypoglycemia    Objective:   Physical Exam VITAL SIGNS:  See vs page GENERAL: no distress head: no deformity  eyes: no periorbital swelling, no proptosis  external nose and ears are normal  mouth: few mm nodule on the tongue Both eac's and tm's are normal LUNGS:  Clear to auscultation   A1c=7.0%    Assessment & Plan:  Tongue nodule, new URI, new Insulin-requiring type 2 DM: well-controlled.  Patient is advised the following: Patient Instructions  Please come back for a follow-up appointment in 4 months.  I have sent a prescription to your pharmacy, for an antibiotic pill.  Here is a prescription for cough syrup.   I hope you feel better soon.  If you don't feel better by next week, please call back.  Please call sooner if you get worse. Please see an ear-nose-throat specialist.  you will  receive a phone call, about a day and time for an appointment. Loratadine-d (non-prescription) will help your congestion.   Please continue the same insulin.

## 2016-05-10 ENCOUNTER — Ambulatory Visit (INDEPENDENT_AMBULATORY_CARE_PROVIDER_SITE_OTHER): Payer: Medicare Other | Admitting: Endocrinology

## 2016-05-10 ENCOUNTER — Encounter: Payer: Self-pay | Admitting: Endocrinology

## 2016-05-10 VITALS — BP 142/88 | HR 77 | Temp 98.2°F | Ht 64.5 in | Wt 261.0 lb

## 2016-05-10 DIAGNOSIS — E1143 Type 2 diabetes mellitus with diabetic autonomic (poly)neuropathy: Secondary | ICD-10-CM

## 2016-05-10 DIAGNOSIS — Z794 Long term (current) use of insulin: Secondary | ICD-10-CM | POA: Diagnosis not present

## 2016-05-10 DIAGNOSIS — R220 Localized swelling, mass and lump, head: Secondary | ICD-10-CM | POA: Diagnosis not present

## 2016-05-10 LAB — POCT GLYCOSYLATED HEMOGLOBIN (HGB A1C): Hemoglobin A1C: 7

## 2016-05-10 MED ORDER — CEFUROXIME AXETIL 250 MG PO TABS: 250.0000 mg | ORAL_TABLET | Freq: Two times a day (BID) | ORAL | 0 refills | Status: AC

## 2016-05-10 MED ORDER — PROMETHAZINE-CODEINE 6.25-10 MG/5ML PO SYRP: 5.0000 mL | ORAL_SOLUTION | ORAL | 0 refills | Status: DC | PRN

## 2016-05-10 NOTE — Patient Instructions (Addendum)
Please come back for a follow-up appointment in 4 months.  I have sent a prescription to your pharmacy, for an antibiotic pill.  Here is a prescription for cough syrup.   I hope you feel better soon.  If you don't feel better by next week, please call back.  Please call sooner if you get worse. Please see an ear-nose-throat specialist.  you will receive a phone call, about a day and time for an appointment. Loratadine-d (non-prescription) will help your congestion.   Please continue the same insulin.

## 2016-05-11 ENCOUNTER — Other Ambulatory Visit: Payer: Self-pay | Admitting: Endocrinology

## 2016-05-17 ENCOUNTER — Other Ambulatory Visit: Payer: Self-pay | Admitting: Internal Medicine

## 2016-05-25 ENCOUNTER — Other Ambulatory Visit: Payer: Self-pay | Admitting: Endocrinology

## 2016-06-06 DIAGNOSIS — D101 Benign neoplasm of tongue: Secondary | ICD-10-CM | POA: Diagnosis not present

## 2016-06-06 DIAGNOSIS — R0982 Postnasal drip: Secondary | ICD-10-CM | POA: Diagnosis not present

## 2016-06-16 DIAGNOSIS — D101 Benign neoplasm of tongue: Secondary | ICD-10-CM | POA: Diagnosis not present

## 2016-06-19 ENCOUNTER — Telehealth: Payer: Self-pay | Admitting: Endocrinology

## 2016-06-19 DIAGNOSIS — J209 Acute bronchitis, unspecified: Secondary | ICD-10-CM | POA: Diagnosis not present

## 2016-06-19 DIAGNOSIS — B370 Candidal stomatitis: Secondary | ICD-10-CM | POA: Diagnosis not present

## 2016-06-19 DIAGNOSIS — J019 Acute sinusitis, unspecified: Secondary | ICD-10-CM | POA: Diagnosis not present

## 2016-06-19 NOTE — Telephone Encounter (Signed)
Patient called This morning, and the [facility] stating she has a cough ,face hurts, can't hardly breath, Wheezing. BEEN USING OTC medication. I told her Jessica [name] wasn't here today, she was having trouble breathing I told her to  go to the urgent care, she agreed.

## 2016-06-22 ENCOUNTER — Ambulatory Visit (INDEPENDENT_AMBULATORY_CARE_PROVIDER_SITE_OTHER): Payer: Medicare Other | Admitting: Endocrinology

## 2016-06-22 VITALS — BP 154/78 | HR 98 | Ht 64.5 in | Wt 256.0 lb

## 2016-06-22 DIAGNOSIS — I10 Essential (primary) hypertension: Secondary | ICD-10-CM | POA: Diagnosis not present

## 2016-06-22 MED ORDER — PROMETHAZINE-DM 6.25-15 MG/5ML PO SYRP: 5.0000 mL | ORAL_SOLUTION | Freq: Four times a day (QID) | ORAL | 0 refills | Status: DC | PRN

## 2016-06-22 MED ORDER — VALSARTAN-HYDROCHLOROTHIAZIDE 160-12.5 MG PO TABS: 1.0000 | ORAL_TABLET | Freq: Every day | ORAL | 11 refills | Status: DC

## 2016-06-22 MED ORDER — FLUTICASONE-SALMETEROL 100-50 MCG/DOSE IN AEPB: 1.0000 | INHALATION_SPRAY | Freq: Two times a day (BID) | RESPIRATORY_TRACT | 3 refills | Status: DC

## 2016-06-22 NOTE — Patient Instructions (Addendum)
I have sent prescriptions to your pharmacy, for a stronger inhaler, stronger blood pressure pill,  and for cough syrup.  Please still take the albuterol as needed.   I hope you feel better soon.  If you don't feel better by next week, please call back.  Please call sooner if you get worse.

## 2016-06-22 NOTE — Progress Notes (Signed)
Subjective:    Patient ID: Jessica [name], female    DOB: February 14, 1951, 66 y.o.   MRN: [id number]  HPI Pt states 1 week of moderate prod-quality cough in the chest, and assoc wheezing.  She was seen at [facility].  She was rx'ed doxycycline, tessalon, and albuterol.  Since then, she feels only slightly better.  Main symptom is whzeezing   Past Medical History:  Diagnosis Date  . Anemia    borderline  . Anxiety   . Arthritis    bilateral knees  . Asthma    childhood  . Autonomic neuropathy    diabetic  . Cataract   . Celiac disease   . Chest pain    intermittant left sided chest pain, non-exertional, lasting 1-2 min  . Depression   . Diabetes mellitus    type II, Hemoglobin A1C 9.9 10/05/2011  . Diabetic neuropathy ([place])   . Diverticulosis   . Fatty liver   . Gastroparesis   . GERD (gastroesophageal reflux disease)   . Heart murmur   . Hemorrhoids   . Hypertension   . IBS (irritable bowel syndrome)   . Narrowing of airway   . Nasal congestion current   sore throat, ears-on antibiotic  . Personal history of colonic polyps 03/2010   hyperplastic  . Pneumonia 11/03/2015   current  . PONV (postoperative nausea and vomiting)   . Recurrent upper respiratory infection (URI)    bronchitis  . Shortness of breath dyspnea   . Unspecified gastritis and gastroduodenitis without mention of hemorrhage     Past Surgical History:  Procedure Laterality Date  . APPENDECTOMY    . COLONOSCOPY    . DILATATION & CURRETTAGE/HYSTEROSCOPY WITH RESECTOCOPE N/A 03/24/2013   Procedure: DILATATION & CURETTAGE, HYSTEROSCOPY WITH RESECTION;  Surgeon: Olga Millers, MD;  Location: [facility];  Service: Gynecology;  Laterality: N/A;  . HYSTEROSCOPY W/D&C N/A 11/08/2015   Procedure: DILATATION AND CURETTAGE /HYSTEROSCOPY;  Surgeon: Olga Millers, MD;  Location: [facility];  Service: Gynecology;  Laterality: N/A;  . left breast cyst removal  2000  . TUBAL LIGATION      Social History   Social History  .  Marital status: Married    Spouse name: Devar Depaula  . Number of children: 2  . 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 Smoking status: Never Smoker  . Smokeless tobacco: Never Used  . Alcohol use No  . Drug use: No  . Sexual activity: Yes    Birth control/ protection: Post-menopausal, Surgical   Other Topics Concern  . Not on file   Social History Narrative  . No narrative on file    Current Outpatient Prescriptions on File Prior to Visit  Medication Sig Dispense Refill  . atorvastatin (LIPITOR) 40 MG tablet TAKE 1 TABLET (40 MG TOTAL) BY MOUTH DAILY. 30 tablet 1  . B-D INS SYR ULTRAFINE 1CC/31G 31G X 5/16" 1 ML MISC USE UP TO 3 A DAY 90 each 11  . DEXILANT 60 MG capsule TAKE 1 CAPSULE (60 MG TOTAL) BY MOUTH DAILY. 30 capsule 7  . gabapentin (NEURONTIN) 300 MG capsule TAKE 1 CAPSULE BY MOUTH 3 TIMES DAILY 270 capsule 1  . glucose blood (ONE TOUCH ULTRA TEST) test strip USE TO TEST BLOOD SUGAR 2 TIMES DAILY. DX: E11.43 100 each 4  . hyoscyamine (LEVSIN SL) 0.125 MG SL tablet Take 1 tablet (0.125  mg total) by mouth every 4 (four) hours as needed. 30 tablet 1  . insulin NPH-regular Human (HUMULIN 70/30) (70-30) 100 UNIT/ML injection INJECT 110 UNITS WITH BREAKFAST, AND 50 UNITS WITH THE EVENING MEAL 60 mL 5  . polyethylene glycol powder (GLYCOLAX/MIRALAX) powder TAKE 1 CAPFUL DISSOLVE IN AT LEAST 8OZ OF WATER/JUICE ONCE DAILY 527 g 3  . ranitidine (ZANTAC) 150 MG tablet TAKE 1 TABLET (150 MG TOTAL) BY MOUTH AT BEDTIME AS NEEDED FOR HEARTBURN. 30 tablet 4  . valACYclovir (VALTREX) 500 MG tablet Take 500 mg by mouth daily.  3  . clotrimazole (MYCELEX) 10 MG troche Take 1 tablet (10 mg total) by mouth 5 (five) times daily. (Patient not taking: Reported on 05/10/2016) 30 tablet 1  . Fluticasone Furoate (ARNUITY ELLIPTA) 200 MCG/ACT AEPB Inhale 200 mcg into the lungs daily. (Patient not taking: Reported on  05/10/2016) 30 each 5   No current facility-administered medications on file prior to visit.     Allergies  Allergen Reactions  . Gluten Meal Other (See Comments)    No wheat or soy  . Nsaids Nausea And Vomiting    Cannot tolerate large doses     Family History  Problem Relation Age of Onset  . Colon cancer Sister   . Diabetes Sister   . Diabetes Brother   . Heart disease Father   . Other Father     2 collapsed lungs    BP (!) 154/78   Pulse 98   Ht 5' 4.5" (1.638 m)   Wt 256 lb (116.1 kg)   SpO2 95%   BMI 43.26 kg/m    Review of Systems fever is resolved.  No earache.       Objective:   Physical Exam VITAL SIGNS:  See vs page GENERAL: no distress head: no deformity  eyes: no periorbital swelling, no proptosis  external nose and ears are normal  mouth: no lesion seen Both eac's and tm's are normal LUNGS:  Clear to auscultation, except for a few scattered wheezes  Ext: no edema.    outside test results are reviewed:  CXR: NAD.    Assessment & Plan:  HTN: she needs increased rx Acute bronchitis, new: She declines steroid pack.  Patient is advised the following: Patient Instructions  I have sent prescriptions to your pharmacy, for a stronger inhaler, stronger blood pressure pill,  and for cough syrup.  Please still take the albuterol as needed.   I hope you feel better soon.  If you don't feel better by next week, please call back.  Please call sooner if you get worse.

## 2016-07-06 DIAGNOSIS — N8500 Endometrial hyperplasia, unspecified: Secondary | ICD-10-CM | POA: Diagnosis not present

## 2016-07-08 ENCOUNTER — Other Ambulatory Visit: Payer: Self-pay | Admitting: Endocrinology

## 2016-07-21 ENCOUNTER — Other Ambulatory Visit: Payer: Self-pay | Admitting: Endocrinology

## 2016-07-24 ENCOUNTER — Other Ambulatory Visit: Payer: Self-pay | Admitting: Internal Medicine

## 2016-07-24 ENCOUNTER — Other Ambulatory Visit: Payer: Self-pay | Admitting: Family Medicine

## 2016-07-28 ENCOUNTER — Other Ambulatory Visit: Payer: Self-pay | Admitting: Endocrinology

## 2016-08-03 ENCOUNTER — Other Ambulatory Visit: Payer: Self-pay | Admitting: Endocrinology

## 2016-08-04 ENCOUNTER — Telehealth: Payer: Self-pay | Admitting: Endocrinology

## 2016-08-04 ENCOUNTER — Other Ambulatory Visit: Payer: Self-pay

## 2016-08-04 MED ORDER — INSULIN NPH ISOPHANE & REGULAR (70-30) 100 UNIT/ML ~~LOC~~ SUSP: SUBCUTANEOUS | 5 refills | Status: DC

## 2016-08-04 NOTE — Telephone Encounter (Signed)
Submitted patient refills for insulin.

## 2016-08-04 NOTE — Telephone Encounter (Signed)
Pt states that she has been out of insulin for 2 weeks and that the pharmacy is having to issue emergency refills.  She states she has to have a refill today.

## 2016-08-10 ENCOUNTER — Telehealth: Payer: Self-pay | Admitting: Endocrinology

## 2016-08-10 NOTE — Telephone Encounter (Signed)
Pt stated that since she started taking this new blood pressure medication that she has been feeling sluggish and just not herself.  She said her blood pressure is about 125/62 (sometimes Diastolic Pressure can range from about 60-63).  She wants to know what Dr. Loanne Drilling would like for her to do.

## 2016-08-10 NOTE — Telephone Encounter (Signed)
Patient advised of message and voiced understanding.

## 2016-08-10 NOTE — Telephone Encounter (Signed)
See message and please advise, Thanks!  

## 2016-08-10 NOTE — Telephone Encounter (Signed)
Please continue the same medication for now, as this is good blood pressure control.

## 2016-08-21 ENCOUNTER — Encounter: Payer: Self-pay | Admitting: Endocrinology

## 2016-08-21 DIAGNOSIS — H25013 Cortical age-related cataract, bilateral: Secondary | ICD-10-CM | POA: Diagnosis not present

## 2016-08-21 DIAGNOSIS — H2513 Age-related nuclear cataract, bilateral: Secondary | ICD-10-CM | POA: Diagnosis not present

## 2016-08-21 DIAGNOSIS — E119 Type 2 diabetes mellitus without complications: Secondary | ICD-10-CM | POA: Diagnosis not present

## 2016-08-21 LAB — HM DIABETES EYE EXAM

## 2016-09-09 ENCOUNTER — Other Ambulatory Visit: Payer: Self-pay | Admitting: Endocrinology

## 2016-09-14 ENCOUNTER — Ambulatory Visit: Payer: Medicare Other | Admitting: Endocrinology

## 2016-10-02 ENCOUNTER — Ambulatory Visit (INDEPENDENT_AMBULATORY_CARE_PROVIDER_SITE_OTHER): Payer: Medicare Other | Admitting: Endocrinology

## 2016-10-02 VITALS — BP 142/82 | HR 95 | Ht 64.5 in | Wt 253.8 lb

## 2016-10-02 DIAGNOSIS — Z794 Long term (current) use of insulin: Secondary | ICD-10-CM

## 2016-10-02 DIAGNOSIS — E1143 Type 2 diabetes mellitus with diabetic autonomic (poly)neuropathy: Secondary | ICD-10-CM

## 2016-10-02 DIAGNOSIS — E119 Type 2 diabetes mellitus without complications: Secondary | ICD-10-CM | POA: Diagnosis not present

## 2016-10-02 LAB — LIPID PANEL
Cholesterol: 143 mg/dL (ref 0–200)
HDL: 43.1 mg/dL (ref >39.00)
LDL Cholesterol: 87 mg/dL (ref 0–99)
NonHDL: 100.1
Total CHOL/HDL Ratio: 3
Triglycerides: 68 mg/dL (ref 0.0–149.0)
VLDL: 13.6 mg/dL (ref 0.0–40.0)

## 2016-10-02 LAB — URINALYSIS, ROUTINE W REFLEX MICROSCOPIC
Bilirubin Urine: NEGATIVE
Hgb urine dipstick: NEGATIVE
Ketones, ur: NEGATIVE
Leukocytes, UA: NEGATIVE
Nitrite: NEGATIVE
RBC / HPF: NONE SEEN (ref 0–?)
Specific Gravity, Urine: 1.025 (ref 1.000–1.030)
Total Protein, Urine: NEGATIVE
Urine Glucose: 100 — AB
Urobilinogen, UA: 0.2 (ref 0.0–1.0)
WBC, UA: NONE SEEN (ref 0–?)
pH: 5.5 (ref 5.0–8.0)

## 2016-10-02 LAB — HEPATIC FUNCTION PANEL
ALT: 17 U/L (ref 0–35)
AST: 18 U/L (ref 0–37)
Albumin: 4 g/dL (ref 3.5–5.2)
Alkaline Phosphatase: 84 U/L (ref 39–117)
Bilirubin, Direct: 0.1 mg/dL (ref 0.0–0.3)
Total Bilirubin: 0.4 mg/dL (ref 0.2–1.2)
Total Protein: 6.9 g/dL (ref 6.0–8.3)

## 2016-10-02 LAB — CBC WITH DIFFERENTIAL/PLATELET
Basophils Absolute: 0 10*3/uL (ref 0.0–0.1)
Basophils Relative: 0.6 % (ref 0.0–3.0)
Eosinophils Absolute: 0.1 10*3/uL (ref 0.0–0.7)
Eosinophils Relative: 1.3 % (ref 0.0–5.0)
HCT: 40.4 % (ref 36.0–46.0)
Hemoglobin: 13.5 g/dL (ref 12.0–15.0)
Lymphocytes Relative: 34.5 % (ref 12.0–46.0)
Lymphs Abs: 1.7 10*3/uL (ref 0.7–4.0)
MCHC: 33.5 g/dL (ref 30.0–36.0)
MCV: 96.1 fl (ref 78.0–100.0)
Monocytes Absolute: 0.3 10*3/uL (ref 0.1–1.0)
Monocytes Relative: 5.5 % (ref 3.0–12.0)
Neutro Abs: 2.9 10*3/uL (ref 1.4–7.7)
Neutrophils Relative %: 58.1 % (ref 43.0–77.0)
Platelets: 253 10*3/uL (ref 150.0–400.0)
RBC: 4.2 Mil/uL (ref 3.87–5.11)
RDW: 13.4 % (ref 11.5–15.5)
WBC: 5 10*3/uL (ref 4.0–10.5)

## 2016-10-02 LAB — BASIC METABOLIC PANEL
BUN: 19 mg/dL (ref 6–23)
CO2: 29 mEq/L (ref 19–32)
Calcium: 9.3 mg/dL (ref 8.4–10.5)
Chloride: 104 mEq/L (ref 96–112)
Creatinine, Ser: 0.98 mg/dL (ref 0.40–1.20)
GFR: 73.02 mL/min (ref >60.00)
Glucose, Bld: 162 mg/dL — ABNORMAL HIGH (ref 70–99)
Potassium: 3.5 mEq/L (ref 3.5–5.1)
Sodium: 138 mEq/L (ref 135–145)

## 2016-10-02 LAB — MICROALBUMIN / CREATININE URINE RATIO
Creatinine,U: 167.3 mg/dL
Microalb Creat Ratio: 0.4 mg/g (ref 0.0–30.0)
Microalb, Ur: 0.7 mg/dL (ref 0.0–1.9)

## 2016-10-02 LAB — TSH: TSH: 1.46 u[IU]/mL (ref 0.35–4.50)

## 2016-10-02 LAB — POCT GLYCOSYLATED HEMOGLOBIN (HGB A1C): Hemoglobin A1C: 7.4

## 2016-10-02 MED ORDER — INSULIN NPH ISOPHANE & REGULAR (70-30) 100 UNIT/ML ~~LOC~~ SUSP: SUBCUTANEOUS | 5 refills | Status: DC

## 2016-10-02 NOTE — Progress Notes (Signed)
Subjective:    Patient ID: Jessica [name], female    DOB: 11/28/1950, 66 y.o.   MRN: [id number]  HPI Pt returns for f/u of diabetes mellitus:  DM type: Insulin-requiring type 2. Dx'ed: 2000.  Complications: polyneuropathy and autonomic neuropathy.   Therapy: insulin since 2005 GDM: never DKA: never Severe hypoglycemia: never.  Pancreatitis: never.  Other: Pt says her ability to care for her DM continues to be compromised by her husband's illness; she has done better on a BID insulin regimen; she takes human insulin, due to cost.   Interval history: no cbg record, but states cbg's vary from 47-200.  There is no trend throughout the day.  pt states she feels well in general, except for severe fatigue.   Past Medical History:  Diagnosis Date  . Anemia    borderline  . Anxiety   . Arthritis    bilateral knees  . Asthma    childhood  . Autonomic neuropathy    diabetic  . Cataract   . Celiac disease   . Chest pain    intermittant left sided chest pain, non-exertional, lasting 1-2 min  . Depression   . Diabetes mellitus    type II, Hemoglobin A1C 9.9 10/05/2011  . Diabetic neuropathy ([place])   . Diverticulosis   . Fatty liver   . Gastroparesis   . GERD (gastroesophageal reflux disease)   . Heart murmur   . Hemorrhoids   . Hypertension   . IBS (irritable bowel syndrome)   . Narrowing of airway   . Nasal congestion current   sore throat, ears-on antibiotic  . Personal history of colonic polyps 03/2010   hyperplastic  . Pneumonia 11/03/2015   current  . PONV (postoperative nausea and vomiting)   . Recurrent upper respiratory infection (URI)    bronchitis  . Shortness of breath dyspnea   . Unspecified gastritis and gastroduodenitis without mention of hemorrhage     Past Surgical History:  Procedure Laterality Date  . APPENDECTOMY    . COLONOSCOPY    . DILATATION & CURRETTAGE/HYSTEROSCOPY WITH RESECTOCOPE N/A 03/24/2013   Procedure: DILATATION & CURETTAGE,  HYSTEROSCOPY WITH RESECTION;  Surgeon: Olga Millers, MD;  Location: [facility];  Service: Gynecology;  Laterality: N/A;  . HYSTEROSCOPY W/D&C N/A 11/08/2015   Procedure: DILATATION AND CURETTAGE /HYSTEROSCOPY;  Surgeon: Olga Millers, MD;  Location: [facility];  Service: Gynecology;  Laterality: N/A;  . left breast cyst removal  2000  . TUBAL LIGATION      Social History   Social History  . Marital status: Married    Spouse name: Devar Santy  . Number of children: 2  . 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 Smoking status: Never Smoker  . Smokeless tobacco: Never Used  . Alcohol use No  . Drug use: No  . Sexual activity: Yes    Birth control/ protection: Post-menopausal, Surgical   Other Topics Concern  . Not on file   Social History Narrative  . No narrative on file    Current Outpatient Prescriptions on File Prior to Visit  Medication Sig Dispense Refill  . albuterol (PROVENTIL HFA;VENTOLIN HFA) 108 (90 Base) MCG/ACT inhaler Inhale into the lungs every 6 (six) hours as needed for wheezing or shortness of breath.    Marland Kitchen atorvastatin (LIPITOR) 40 MG tablet TAKE 1 TABLET (40 MG TOTAL) BY MOUTH DAILY. 30 tablet 1  .  B-D INS SYR ULTRAFINE 1CC/31G 31G X 5/16" 1 ML MISC USE UP TO 3 A DAY 90 each 11  . DEXILANT 60 MG capsule TAKE 1 CAPSULE (60 MG TOTAL) BY MOUTH DAILY. 30 capsule 7  . Fluticasone-Salmeterol (ADVAIR) 100-50 MCG/DOSE AEPB Inhale 1 puff into the lungs 2 (two) times daily. 1 each 3  . gabapentin (NEURONTIN) 300 MG capsule TAKE 1 CAPSULE BY MOUTH 3 TIMES DAILY 270 capsule 0  . glucose blood (ONE TOUCH ULTRA TEST) test strip USE TO TEST BLOOD SUGAR 2 TIMES DAILY. DX: E11.43 100 each 4  . hyoscyamine (LEVSIN SL) 0.125 MG SL tablet Take 1 tablet (0.125 mg total) by mouth every 4 (four) hours as needed. 30 tablet 1  . polyethylene glycol powder (GLYCOLAX/MIRALAX) powder TAKE 1 CAPFUL DISSOLVE IN AT  LEAST 8OZ OF WATER/JUICE ONCE DAILY 527 g 3  . ranitidine (ZANTAC) 150 MG tablet TAKE 1 TABLET (150 MG TOTAL) BY MOUTH AT BEDTIME AS NEEDED FOR HEARTBURN. 30 tablet 4  . valACYclovir (VALTREX) 500 MG tablet Take 500 mg by mouth daily.  3  . valsartan-hydrochlorothiazide (DIOVAN-HCT) 160-12.5 MG tablet Take 1 tablet by mouth daily. 30 tablet 11   No current facility-administered medications on file prior to visit.     Allergies  Allergen Reactions  . Gluten Meal Other (See Comments)    No wheat or soy  . Nsaids Nausea And Vomiting    Cannot tolerate large doses     Family History  Problem Relation Age of Onset  . Colon cancer Sister   . Diabetes Sister   . Diabetes Brother   . Heart disease Father   . Other Father        2 collapsed lungs    BP (!) 142/82 (BP Location: Left Arm, Patient Position: Sitting, Cuff Size: Normal)   Pulse 95   Ht 5' 4.5" (1.638 m)   Wt 253 lb 12.8 oz (115.1 kg)   SpO2 99%   BMI 42.89 kg/m    Review of Systems She frequently awakens in the middle of the night. No change in chronic mild doe.      Objective:   Physical Exam VITAL SIGNS:  See vs page GENERAL: no distress LUNGS:  Clear to auscultation HEART:  Regular rate and rhythm without murmurs noted. Normal S1,S2.   Pulses: dorsalis pedis intact bilat.   MSK: no deformity of the feet CV: no leg edema Skin:  no ulcer on the feet.  normal color and temp on the feet. Neuro: sensation is intact to touch on the feet.    I reviewed spirometry from 2017  Lab Results  Component Value Date   HGBA1C 7.4 10/02/2016      Assessment & Plan:  Insulin-requiring type 2 DM, with polyneuropathy: this is the best control this pt should aim for, given this regimen, which does match insulin to her changing needs throughout the day Mild sob: I advised pt to continue advair.   Fatigue, uncertain etiology.  Check labs and slightly reduce insulin.     Patient Instructions  Please reduce the evening  insulin to 40 units.  Fatigue is a side-effect of gabapentin, but it is helping.  Please continue to work with Dr Tamala Julian on your symptoms. blood tests are requested for you today.  We'll let you know about the results.  Please continue the same advair.   check your blood sugar twice a day.  vary the time of day when you check, between before the  3 meals, and at bedtime.  also check if you have symptoms of your blood sugar being too high or too low.  please keep a record of the readings and bring it to your next appointment here (or you can bring the meter itself).  You can write it on any piece of paper.  please call us sooner if your blood sugar goes below 70, or if you have a lot of readings over 200. Please come back for a follow-up appointment in 3 months.

## 2016-10-02 NOTE — Patient Instructions (Addendum)
Please reduce the evening insulin to 40 units.  Fatigue is a side-effect of gabapentin, but it is helping.  Please continue to work with Dr Tamala Julian on your symptoms. blood tests are requested for you today.  We'll let you know about the results.  Please continue the same advair.   check your blood sugar twice a day.  vary the time of day when you check, between before the 3 meals, and at bedtime.  also check if you have symptoms of your blood sugar being too high or too low.  please keep a record of the readings and bring it to your next appointment here (or you can bring the meter itself).  You can write it on any piece of paper.  please call us sooner if your blood sugar goes below 70, or if you have a lot of readings over 200. Please come back for a follow-up appointment in 3 months.

## 2016-10-04 ENCOUNTER — Telehealth: Payer: Self-pay

## 2016-10-04 NOTE — Telephone Encounter (Signed)
Called patient and gave lab results. Patient had no questions or concerns.  

## 2016-10-04 NOTE — Telephone Encounter (Signed)
-----   Message from Renato Shin, MD sent at 10/02/2016  6:44 PM EDT ----- please call patient: Good results

## 2016-10-09 ENCOUNTER — Other Ambulatory Visit: Payer: Self-pay | Admitting: Endocrinology

## 2016-10-18 DIAGNOSIS — N200 Calculus of kidney: Secondary | ICD-10-CM | POA: Diagnosis not present

## 2016-10-21 ENCOUNTER — Other Ambulatory Visit: Payer: Self-pay | Admitting: Endocrinology

## 2016-10-23 ENCOUNTER — Other Ambulatory Visit: Payer: Self-pay | Admitting: Internal Medicine

## 2016-10-23 ENCOUNTER — Other Ambulatory Visit: Payer: Self-pay | Admitting: Family Medicine

## 2016-10-26 ENCOUNTER — Telehealth: Payer: Self-pay | Admitting: Endocrinology

## 2016-10-26 NOTE — Telephone Encounter (Signed)
Patient would like a call back RE rash under arm, black grainy moles over different areas of body, and skin tags that need to be removed.  Thank you,  -LL

## 2016-10-26 NOTE — Telephone Encounter (Signed)
Patient requested to come in for an appointment tomorrow at 8 am. She agreed and scheduled her appointment.

## 2016-10-27 ENCOUNTER — Ambulatory Visit (INDEPENDENT_AMBULATORY_CARE_PROVIDER_SITE_OTHER): Payer: Medicare Other | Admitting: Endocrinology

## 2016-10-27 ENCOUNTER — Encounter: Payer: Self-pay | Admitting: Endocrinology

## 2016-10-27 DIAGNOSIS — W57XXXA Bitten or stung by nonvenomous insect and other nonvenomous arthropods, initial encounter: Secondary | ICD-10-CM

## 2016-10-27 DIAGNOSIS — S20461A Insect bite (nonvenomous) of right back wall of thorax, initial encounter: Secondary | ICD-10-CM

## 2016-10-27 DIAGNOSIS — L989 Disorder of the skin and subcutaneous tissue, unspecified: Secondary | ICD-10-CM

## 2016-10-27 MED ORDER — DOXYCYCLINE HYCLATE 100 MG PO TABS: 100.0000 mg | ORAL_TABLET | Freq: Two times a day (BID) | ORAL | 0 refills | Status: DC

## 2016-10-27 MED ORDER — FLUCONAZOLE 150 MG PO TABS: 150.0000 mg | ORAL_TABLET | Freq: Every day | ORAL | 0 refills | Status: DC

## 2016-10-27 NOTE — Progress Notes (Signed)
Subjective:    Patient ID: Jessica [name], female    DOB: Jan 27, 1951, 66 y.o.   MRN: [id number]  HPI Pt states 1 week of slight rash at the right axilla, but no assoc pain.   Past Medical History:  Diagnosis Date  . Anemia    borderline  . Anxiety   . Arthritis    bilateral knees  . Asthma    childhood  . Autonomic neuropathy    diabetic  . Cataract   . Celiac disease   . Chest pain    intermittant left sided chest pain, non-exertional, lasting 1-2 min  . Depression   . Diabetes mellitus    type II, Hemoglobin A1C 9.9 10/05/2011  . Diabetic neuropathy ([place])   . Diverticulosis   . Fatty liver   . Gastroparesis   . GERD (gastroesophageal reflux disease)   . Heart murmur   . Hemorrhoids   . Hypertension   . IBS (irritable bowel syndrome)   . Narrowing of airway   . Nasal congestion current   sore throat, ears-on antibiotic  . Personal history of colonic polyps 03/2010   hyperplastic  . Pneumonia 11/03/2015   current  . PONV (postoperative nausea and vomiting)   . Recurrent upper respiratory infection (URI)    bronchitis  . Shortness of breath dyspnea   . Unspecified gastritis and gastroduodenitis without mention of hemorrhage     Past Surgical History:  Procedure Laterality Date  . APPENDECTOMY    . COLONOSCOPY    . DILATATION & CURRETTAGE/HYSTEROSCOPY WITH RESECTOCOPE N/A 03/24/2013   Procedure: DILATATION & CURETTAGE, HYSTEROSCOPY WITH RESECTION;  Surgeon: Olga Millers, MD;  Location: [facility];  Service: Gynecology;  Laterality: N/A;  . HYSTEROSCOPY W/D&C N/A 11/08/2015   Procedure: DILATATION AND CURETTAGE /HYSTEROSCOPY;  Surgeon: Olga Millers, MD;  Location: [facility];  Service: Gynecology;  Laterality: N/A;  . left breast cyst removal  2000  . TUBAL LIGATION      Social History   Social History  . Marital status: Married    Spouse name: Devar Uffelman  . Number of children: 2  . Years of education: N/A   Occupational History  . works for school  system Retired   Social History Main Topics  . Smoking status: Never Smoker  . Smokeless tobacco: Never Used  . Alcohol use No  . Drug use: No  . Sexual activity: Yes    Birth control/ protection: Post-menopausal, Surgical   Other Topics Concern  . Not on file   Social History Narrative  . No narrative on file    Current Outpatient Prescriptions on File Prior to Visit  Medication Sig Dispense Refill  . albuterol (PROVENTIL HFA;VENTOLIN HFA) 108 (90 Base) MCG/ACT inhaler Inhale into the lungs every 6 (six) hours as needed for wheezing or shortness of breath.    Marland Kitchen atorvastatin (LIPITOR) 40 MG tablet TAKE 1 TABLET (40 MG TOTAL) BY MOUTH DAILY. 30 tablet 1  . B-D INS SYR ULTRAFINE 1CC/31G 31G X 5/16" 1 ML MISC USE UP TO 3 A DAY 90 each 11  . DEXILANT 60 MG capsule TAKE 1 CAPSULE (60 MG TOTAL) BY MOUTH DAILY. 30 capsule 7  . Fluticasone-Salmeterol (ADVAIR) 100-50 MCG/DOSE AEPB Inhale 1 puff into the lungs 2 (two) times daily. 1 each 3  . gabapentin (NEURONTIN) 300 MG capsule TAKE 1 CAPSULE BY MOUTH 3 TIMES DAILY 270 capsule 0  . glucose blood (ONE TOUCH ULTRA TEST) test strip USE TO TEST  BLOOD SUGAR 2 TIMES DAILY. DX: E11.43 100 each 4  . hyoscyamine (LEVSIN SL) 0.125 MG SL tablet Take 1 tablet (0.125 mg total) by mouth every 4 (four) hours as needed. 30 tablet 1  . insulin NPH-regular Human (HUMULIN 70/30) (70-30) 100 UNIT/ML injection INJECT 110 UNITS WITH BREAKFAST AND 40 UNITS WITH EVENING MEAL 60 mL 5  . ketoconazole (NIZORAL) 2 % cream APPLY TO AFFECTED AREA EVERY DAY 30 g 2  . polyethylene glycol powder (GLYCOLAX/MIRALAX) powder TAKE 1 CAPFUL DISSOLVE IN AT LEAST 8OZ OF WATER/JUICE ONCE DAILY 527 g 3  . ranitidine (ZANTAC) 150 MG tablet TAKE 1 TABLET (150 MG TOTAL) BY MOUTH AT BEDTIME AS NEEDED FOR HEARTBURN. 30 tablet 2  . valACYclovir (VALTREX) 500 MG tablet TAKE 1 TABLET BY MOUTH EVERY DAY 60 tablet 3  . valsartan-hydrochlorothiazide (DIOVAN-HCT) 160-12.5 MG tablet Take 1 tablet  by mouth daily. 30 tablet 11   No current facility-administered medications on file prior to visit.     Allergies  Allergen Reactions  . Gluten Meal Other (See Comments)    No wheat or soy  . Nsaids Nausea And Vomiting    Cannot tolerate large doses     Family History  Problem Relation Age of Onset  . Colon cancer Sister   . Diabetes Sister   . Diabetes Brother   . Heart disease Father   . Other Father        2 collapsed lungs    BP 132/68   Pulse 72   Ht 5' 4.5" (1.638 m)   Wt 257 lb (116.6 kg)   SpO2 96%   BMI 43.43 kg/m    Review of Systems She has a mole on the back, but no fever.     Objective:   Physical Exam VITAL SIGNS:  See vs page GENERAL: no distress Right axilla: 4 cm hyperpigmented rash.  No tend or fluctuance right upper back: tick is noted and removed.    1 skin tag is removed from each axilla, with battery-powered cauterizer.     Assessment & Plan:  Skin tags, s/p resection Tick bite, new Superficial mycosis, persistent.  Patient Instructions  I have sent 2 prescription to your pharmacy. I'll see you next time.

## 2016-10-27 NOTE — Patient Instructions (Signed)
I have sent 2 prescription to your pharmacy. I'll see you next time.

## 2016-10-29 DIAGNOSIS — W57XXXA Bitten or stung by nonvenomous insect and other nonvenomous arthropods, initial encounter: Secondary | ICD-10-CM | POA: Insufficient documentation

## 2016-11-08 ENCOUNTER — Other Ambulatory Visit: Payer: Self-pay | Admitting: Internal Medicine

## 2016-11-08 ENCOUNTER — Other Ambulatory Visit: Payer: Self-pay | Admitting: Endocrinology

## 2016-11-09 ENCOUNTER — Telehealth: Payer: Self-pay | Admitting: Endocrinology

## 2016-11-09 NOTE — Telephone Encounter (Signed)
Can you please advise.  Thank you!

## 2016-11-09 NOTE — Telephone Encounter (Signed)
Ov tomorrow 9 AM

## 2016-11-09 NOTE — Telephone Encounter (Signed)
Patient called to advise that she has taken all medication for the tick bite however, the rash under her arm is still there and painful. Call patient to advise as soon as possible.

## 2016-11-10 NOTE — Telephone Encounter (Signed)
[facility] for 07:45

## 2016-11-10 NOTE — Telephone Encounter (Signed)
I contacted the patient. Patient was unable to come for the appointment today. Patient stated she could come in Tuesday at 745 am or 800 am. Would this be ok?

## 2016-11-10 NOTE — Telephone Encounter (Signed)
Patient called back. Unable to locate anyone on lunch hour. She states that the mobile # is the best # to get her at this afternoon. Please call the patient.

## 2016-11-10 NOTE — Telephone Encounter (Signed)
Patient notified of appointment time and agreed. Colletta [place], please add the patient on for 11/14/2016 at 745 am. Thanks!

## 2016-11-10 NOTE — Telephone Encounter (Signed)
Message was not reviewed until 11/10/2016. Requested a call back from the patient to discuss appointment.

## 2016-11-13 NOTE — Telephone Encounter (Signed)
complete

## 2016-11-14 ENCOUNTER — Encounter: Payer: Self-pay | Admitting: Endocrinology

## 2016-11-14 ENCOUNTER — Ambulatory Visit (INDEPENDENT_AMBULATORY_CARE_PROVIDER_SITE_OTHER): Payer: Medicare Other | Admitting: Endocrinology

## 2016-11-14 VITALS — BP 144/84 | HR 83 | Ht 64.5 in | Wt 256.0 lb

## 2016-11-14 DIAGNOSIS — R21 Rash and other nonspecific skin eruption: Secondary | ICD-10-CM

## 2016-11-14 MED ORDER — FLUOCINONIDE 0.05 % EX CREA: 1.0000 "application " | TOPICAL_CREAM | Freq: Four times a day (QID) | CUTANEOUS | 1 refills | Status: DC

## 2016-11-14 NOTE — Progress Notes (Signed)
Subjective:    Patient ID: Jessica [name], female    DOB: 23-May-1950, 66 y.o.   MRN: [id number]  HPI Pt states few weeks of moderate rash at the right axilla, and assoc itching.   Past Medical History:  Diagnosis Date  . Anemia    borderline  . Anxiety   . Arthritis    bilateral knees  . Asthma    childhood  . Autonomic neuropathy    diabetic  . Cataract   . Celiac disease   . Chest pain    intermittant left sided chest pain, non-exertional, lasting 1-2 min  . Depression   . Diabetes mellitus    type II, Hemoglobin A1C 9.9 10/05/2011  . Diabetic neuropathy ([place])   . Diverticulosis   . Fatty liver   . Gastroparesis   . GERD (gastroesophageal reflux disease)   . Heart murmur   . Hemorrhoids   . Hypertension   . IBS (irritable bowel syndrome)   . Narrowing of airway   . Nasal congestion current   sore throat, ears-on antibiotic  . Personal history of colonic polyps 03/2010   hyperplastic  . Pneumonia 11/03/2015   current  . PONV (postoperative nausea and vomiting)   . Recurrent upper respiratory infection (URI)    bronchitis  . Shortness of breath dyspnea   . Unspecified gastritis and gastroduodenitis without mention of hemorrhage     Past Surgical History:  Procedure Laterality Date  . APPENDECTOMY    . COLONOSCOPY    . DILATATION & CURRETTAGE/HYSTEROSCOPY WITH RESECTOCOPE N/A 03/24/2013   Procedure: DILATATION & CURETTAGE, HYSTEROSCOPY WITH RESECTION;  Surgeon: Olga Millers, MD;  Location: [facility];  Service: Gynecology;  Laterality: N/A;  . HYSTEROSCOPY W/D&C N/A 11/08/2015   Procedure: DILATATION AND CURETTAGE /HYSTEROSCOPY;  Surgeon: Olga Millers, MD;  Location: [facility];  Service: Gynecology;  Laterality: N/A;  . left breast cyst removal  2000  . TUBAL LIGATION      Social History   Social History  . Marital status: Married    Spouse name: Devar Zenner  . Number of children: 2  . Years of education: N/A   Occupational History  . works for  school system Retired   Social History Main Topics  . Smoking status: Never Smoker  . Smokeless tobacco: Never Used  . Alcohol use No  . Drug use: No  . Sexual activity: Yes    Birth control/ protection: Post-menopausal, Surgical   Other Topics Concern  . Not on file   Social History Narrative  . No narrative on file    Current Outpatient Prescriptions on File Prior to Visit  Medication Sig Dispense Refill  . albuterol (PROVENTIL HFA;VENTOLIN HFA) 108 (90 Base) MCG/ACT inhaler Inhale into the lungs every 6 (six) hours as needed for wheezing or shortness of breath.    Marland Kitchen atorvastatin (LIPITOR) 40 MG tablet TAKE 1 TABLET (40 MG TOTAL) BY MOUTH DAILY. 30 tablet 1  . B-D INS SYR ULTRAFINE 1CC/31G 31G X 5/16" 1 ML MISC USE UP TO 3 A DAY 90 each 11  . DEXILANT 60 MG capsule TAKE 1 CAPSULE (60 MG TOTAL) BY MOUTH DAILY. 30 capsule 7  . Fluticasone-Salmeterol (ADVAIR) 100-50 MCG/DOSE AEPB Inhale 1 puff into the lungs 2 (two) times daily. 1 each 3  . gabapentin (NEURONTIN) 300 MG capsule TAKE 1 CAPSULE BY MOUTH 3 TIMES DAILY 270 capsule 0  . glucose blood (ONE TOUCH ULTRA TEST) test strip USE TO TEST BLOOD  SUGAR 2 TIMES DAILY. DX: E11.43 100 each 4  . hyoscyamine (LEVSIN SL) 0.125 MG SL tablet Take 1 tablet (0.125 mg total) by mouth every 4 (four) hours as needed. 30 tablet 1  . insulin NPH-regular Human (HUMULIN 70/30) (70-30) 100 UNIT/ML injection INJECT 110 UNITS WITH BREAKFAST AND 40 UNITS WITH EVENING MEAL 60 mL 5  . ketoconazole (NIZORAL) 2 % cream APPLY TO AFFECTED AREA EVERY DAY 30 g 2  . polyethylene glycol powder (GLYCOLAX/MIRALAX) powder TAKE 1 CAPFUL DISSOLVE IN AT LEAST 8OZ OF WATER/JUICE ONCE DAILY 527 g 2  . ranitidine (ZANTAC) 150 MG tablet TAKE 1 TABLET (150 MG TOTAL) BY MOUTH AT BEDTIME AS NEEDED FOR HEARTBURN. 30 tablet 2  . valACYclovir (VALTREX) 500 MG tablet TAKE 1 TABLET BY MOUTH EVERY DAY 60 tablet 3  . valsartan-hydrochlorothiazide (DIOVAN-HCT) 160-12.5 MG tablet Take 1  tablet by mouth daily. 30 tablet 11  . fluconazole (DIFLUCAN) 150 MG tablet Take 1 tablet (150 mg total) by mouth daily. (Patient not taking: Reported on 11/14/2016) 3 tablet 0   No current facility-administered medications on file prior to visit.     Allergies  Allergen Reactions  . Gluten Meal Other (See Comments)    No wheat or soy  . Nsaids Nausea And Vomiting    Cannot tolerate large doses     Family History  Problem Relation Age of Onset  . Colon cancer Sister   . Diabetes Sister   . Diabetes Brother   . Heart disease Father   . Other Father        2 collapsed lungs    BP (!) 144/84   Pulse 83   Ht 5' 4.5" (1.638 m)   Wt 256 lb (116.1 kg)   SpO2 98%   BMI 43.26 kg/m    Review of Systems there are also several skin nodules at the back and left hip. No bleeding from the skin.     Objective:   Physical Exam VITAL SIGNS:  See vs page GENERAL: no distress Right axilla: two areas of hyperpigmentation.  Each is approx 5 cm diameter.  Skin: there are 2 seborrheic keratoses: at the left lower back, and at the left hip     Assessment & Plan:  Rash, persistent.  Seborrheic keratoses, new dx HTN: borderline control.  Recheck next time.  Patient Instructions  I have sent a prescription to your pharmacy, for a skin cream A blood test is requested for you today.  We'll let you know about the results. No treatment is needed for the spots on your back and left hip. I'll see you next time.

## 2016-11-14 NOTE — Patient Instructions (Signed)
I have sent a prescription to your pharmacy, for a skin cream A blood test is requested for you today.  We'll let you know about the results. No treatment is needed for the spots on your back and left hip. I'll see you next time.

## 2016-11-15 LAB — LYME, IGM, EARLY TEST/REFLEX: LYME DISEASE AB, QUANT, IGM: <0.8 index (ref 0.00–0.79)

## 2016-12-21 ENCOUNTER — Other Ambulatory Visit: Payer: Self-pay | Admitting: Internal Medicine

## 2016-12-29 ENCOUNTER — Encounter: Payer: Self-pay | Admitting: Internal Medicine

## 2017-01-03 ENCOUNTER — Ambulatory Visit: Payer: Medicare Other | Admitting: Endocrinology

## 2017-01-07 ENCOUNTER — Other Ambulatory Visit: Payer: Self-pay | Admitting: Endocrinology

## 2017-01-16 ENCOUNTER — Other Ambulatory Visit: Payer: Self-pay | Admitting: Internal Medicine

## 2017-01-22 ENCOUNTER — Other Ambulatory Visit: Payer: Self-pay | Admitting: Family Medicine

## 2017-01-22 NOTE — Telephone Encounter (Signed)
Refill done.  

## 2017-01-24 ENCOUNTER — Other Ambulatory Visit: Payer: Self-pay | Admitting: Endocrinology

## 2017-01-30 ENCOUNTER — Other Ambulatory Visit: Payer: Self-pay | Admitting: Family Medicine

## 2017-01-30 NOTE — Telephone Encounter (Signed)
Refill done.  

## 2017-02-03 ENCOUNTER — Other Ambulatory Visit: Payer: Self-pay | Admitting: Internal Medicine

## 2017-03-03 ENCOUNTER — Other Ambulatory Visit: Payer: Self-pay | Admitting: Endocrinology

## 2017-03-13 ENCOUNTER — Other Ambulatory Visit: Payer: Self-pay | Admitting: Endocrinology

## 2017-03-19 ENCOUNTER — Encounter: Payer: Self-pay | Admitting: Internal Medicine

## 2017-03-19 ENCOUNTER — Ambulatory Visit (INDEPENDENT_AMBULATORY_CARE_PROVIDER_SITE_OTHER): Payer: Medicare Other | Admitting: Internal Medicine

## 2017-03-19 VITALS — BP 152/80 | HR 76 | Ht 65.0 in | Wt 249.5 lb

## 2017-03-19 DIAGNOSIS — R110 Nausea: Secondary | ICD-10-CM | POA: Diagnosis not present

## 2017-03-19 DIAGNOSIS — K581 Irritable bowel syndrome with constipation: Secondary | ICD-10-CM | POA: Diagnosis not present

## 2017-03-19 DIAGNOSIS — K900 Celiac disease: Secondary | ICD-10-CM

## 2017-03-19 DIAGNOSIS — Z800 Family history of malignant neoplasm of digestive organs: Secondary | ICD-10-CM

## 2017-03-19 DIAGNOSIS — K219 Gastro-esophageal reflux disease without esophagitis: Secondary | ICD-10-CM | POA: Diagnosis not present

## 2017-03-19 DIAGNOSIS — R1031 Right lower quadrant pain: Secondary | ICD-10-CM | POA: Diagnosis not present

## 2017-03-19 MED ORDER — HYOSCYAMINE SULFATE 0.125 MG SL SUBL: 0.1250 mg | SUBLINGUAL_TABLET | SUBLINGUAL | 3 refills | Status: DC | PRN

## 2017-03-19 MED ORDER — RANITIDINE HCL 150 MG PO TABS: ORAL_TABLET | ORAL | 0 refills | Status: DC

## 2017-03-19 MED ORDER — DEXLANSOPRAZOLE 60 MG PO CPDR: 60.0000 mg | DELAYED_RELEASE_CAPSULE | Freq: Every day | ORAL | 3 refills | Status: DC

## 2017-03-19 MED ORDER — POLYETHYLENE GLYCOL 3350 17 GM/SCOOP PO POWD: ORAL | 3 refills | Status: DC

## 2017-03-19 NOTE — Progress Notes (Signed)
Subjective:    Patient ID: Jessica [name], female    DOB: February 27, 1951, 66 y.o.   MRN: [id number]  HPI Jessica [name] is a 66 year old female with a history of celiac disease, GERD, IBS, hyperplastic colon polyps, family history of colon cancer in her sister who is here for follow-up.  She was last seen on 02/15/2016.  She is here alone today.  From a GI perspective she continues to follow a strict gluten-free diet.  She has noticed some increase in nausea particularly after eating.  She feels that this is been worse with certain foods such as mayonnaise and so she has changed her brand which is helped somewhat.  She is very conscious to eating gluten-free and feels that if she does get some mild cross-contamination from eating out she will feel worsening nausea and stomach pain.  She has not had vomiting.  She has had some intermittent right lower quadrant abdominal pain which radiates from her right side towards her right groin.  This can come and go and overall has been mild.  She has a prescription for Levsin but has not used this because the medication she felt might be too old.  She has been maintained on her MiraLAX 17 g roughly once daily to help her move her bowels and avoid constipation.  This seems to work well for her.  She has continued Dexilant 60 mg daily and has had good heartburn control.  She is using ranitidine on occasion 150 mg in the evening for breakthrough heartburn.  She denies seeing blood in her stool and melena.    Her last colonoscopy was on 08/25/2014 which showed mild diverticulosis in the ascending And left colon but was otherwise normal.  5-year recall was recommended based on her family history.  Her last EGD was in October 2008 with Dr. Sharlett Iles which showed gastritis and retained food concerning for mild gastroparesis.  Review of Systems As per HPI, otherwise negative  Current Medications, Allergies, Past Medical History, Past Surgical History, Family History and  Social History were reviewed in [employer] record.      Objective:   Physical Exam BP (!) 152/80 (BP Location: Left Arm, Patient Position: Sitting, Cuff Size: Large)   Pulse 76   Ht 5' 5"  (1.651 m)   Wt 249 lb 8 oz (113.2 kg)   BMI 41.52 kg/m  Constitutional: Well-developed and well-nourished. No distress. HEENT: Normocephalic and atraumatic. Oropharynx is clear and moist. Conjunctivae are normal.  No scleral icterus. Neck: Neck supple. Trachea midline. Cardiovascular: Normal rate, regular rhythm and intact distal pulses. No M/R/G Pulmonary/chest: Effort normal and breath sounds normal. No wheezing, rales or rhonchi. Abdominal: Soft, obese, mild epigastric tenderness without rebound or guarding, nondistended. Bowel sounds active throughout. There are no masses palpable.  Extremities: no clubbing, cyanosis, or edema Neurological: Alert and oriented to person place and time. Skin: Skin is warm and dry. Psychiatric: Normal mood and affect. Behavior is normal.  CBC    Component Value Date/Time   WBC 5.0 10/02/2016 1521   RBC 4.20 10/02/2016 1521   HGB 13.5 10/02/2016 1521   HCT 40.4 10/02/2016 1521   PLT 253.0 10/02/2016 1521   MCV 96.1 10/02/2016 1521   MCH 32.8 01/07/2016 1400   MCHC 33.5 10/02/2016 1521   RDW 13.4 10/02/2016 1521   LYMPHSABS 1.7 10/02/2016 1521   MONOABS 0.3 10/02/2016 1521   EOSABS 0.1 10/02/2016 1521   BASOSABS 0.0 10/02/2016 1521   CMP  Component Value Date/Time   NA 138 10/02/2016 1521   K 3.5 10/02/2016 1521   CL 104 10/02/2016 1521   CO2 29 10/02/2016 1521   GLUCOSE 162 (H) 10/02/2016 1521   BUN 19 10/02/2016 1521   CREATININE 0.98 10/02/2016 1521   CREATININE 0.83 04/04/2012 0832   CALCIUM 9.3 10/02/2016 1521   PROT 6.9 10/02/2016 1521   ALBUMIN 4.0 10/02/2016 1521   AST 18 10/02/2016 1521   ALT 17 10/02/2016 1521   ALKPHOS 84 10/02/2016 1521   BILITOT 0.4 10/02/2016 1521   GFRNONAA 55 (L) 01/07/2016 1622    GFRAA >60 01/07/2016 1622      Assessment & Plan:  66 year old female with a history of celiac disease, GERD, IBS, hyperplastic colon polyps, family history of colon cancer in her sister who is here for follow-up.  1.  GERD/celiac disease/intermittent nausea --she has followed a strict gluten-free diet and her celiac disease is likely under adequate control.  When her TTG test was last checked it had normalized.  It was high previously.  Her reflux is under good control with Dexilant 60 mg daily which we will continue.  When she is tried to be off of this medication her reflux was moderate to severe.  She can use ranitidine 150 mg in the evening as needed and Gaviscon OTC as needed for more acute symptoms.  Given her nausea and her history I have recommended we repeat upper endoscopy.  We discussed the risks, benefits and alternatives and she is agreeable and wishes to proceed.  We will plan small bowel biopsies to assess celiac disease at that time.  Also we look for any possible signs of gastroparesis which in her case would likely be diabetic related.  2. IBS with constipation -- MiraLAX works well for her.  We will continue MiraLAX 17 g daily.  She can take an additional dose on an as-needed basis for constipation when necessary.    3.  Right lower quadrant pain --with her history this certainly could be recurrent kidney stone type pain but also may be related to irritable bowel.  Will refill Levsin to be used 0.125 every 4-6 hours as needed.  If this pain does not get better, is not relieved by Levsin, she is asked to notify me.  She voices understanding  4.  Family history of colon cancer -- repeat screening colonoscopy recommended in 2021  25 minutes spent with the patient today. Greater than 50% was spent in counseling and coordination of care with the patient

## 2017-03-19 NOTE — Patient Instructions (Signed)
You have been scheduled for an endoscopy. Please follow written instructions given to you at your visit today. If you use inhalers (even only as needed), please bring them with you on the day of your procedure. Your physician has requested that you go to www.startemmi.com and enter the access code given to you at your visit today. This web site gives a general overview about your procedure. However, you should still follow specific instructions given to you by our office regarding your preparation for the procedure.  We have sent the following medications to your pharmacy for you to pick up at your convenience: Dexilant  Ranitidine  Levsin Miralax twice daily  Please purchase the following medications over the counter and take as directed: Gaviscon as needed for breakthrough reflux  If you are age 52 or older, your body mass index should be between 23-30. Your Body mass index is 41.52 kg/m. If this is out of the aforementioned range listed, please consider follow up with your Primary Care Provider.  If you are age 47 or younger, your body mass index should be between 19-25. Your Body mass index is 41.52 kg/m. If this is out of the aformentioned range listed, please consider follow up with your Primary Care Provider.

## 2017-04-01 ENCOUNTER — Other Ambulatory Visit: Payer: Self-pay | Admitting: Endocrinology

## 2017-04-17 ENCOUNTER — Other Ambulatory Visit: Payer: Self-pay | Admitting: Endocrinology

## 2017-05-03 ENCOUNTER — Other Ambulatory Visit: Payer: Self-pay

## 2017-05-03 ENCOUNTER — Telehealth: Payer: Self-pay | Admitting: Endocrinology

## 2017-05-03 MED ORDER — LOSARTAN POTASSIUM-HCTZ 100-12.5 MG PO TABS: 1.0000 | ORAL_TABLET | Freq: Every day | ORAL | 3 refills | Status: DC

## 2017-05-03 NOTE — Telephone Encounter (Signed)
Patuiernt stated she will no longer take the medication valsartan-hydrochlorothiazide (DIOVAN-HCT) 160-12.5 MG tablet [[id number]]  There has been a recall, and would like to take something else. please

## 2017-05-03 NOTE — Telephone Encounter (Signed)
I have sent a prescription to your pharmacy, to change to a different med

## 2017-05-03 NOTE — Telephone Encounter (Signed)
I called patient & informed her new prescription was sent to her pharmacy. She stated she would try it but was still concerned it as similar to the other medication.

## 2017-05-15 ENCOUNTER — Other Ambulatory Visit: Payer: Self-pay | Admitting: Endocrinology

## 2017-05-22 ENCOUNTER — Other Ambulatory Visit: Payer: Self-pay

## 2017-05-22 ENCOUNTER — Encounter: Payer: Self-pay | Admitting: Internal Medicine

## 2017-05-22 ENCOUNTER — Ambulatory Visit (AMBULATORY_SURGERY_CENTER): Payer: Medicare Other | Admitting: Internal Medicine

## 2017-05-22 VITALS — BP 138/69 | HR 78 | Temp 97.2°F | Resp 12 | Ht 65.0 in | Wt 249.0 lb

## 2017-05-22 DIAGNOSIS — Z6841 Body Mass Index (BMI) 40.0 and over, adult: Secondary | ICD-10-CM | POA: Diagnosis not present

## 2017-05-22 DIAGNOSIS — K3189 Other diseases of stomach and duodenum: Secondary | ICD-10-CM | POA: Diagnosis not present

## 2017-05-22 DIAGNOSIS — K319 Disease of stomach and duodenum, unspecified: Secondary | ICD-10-CM | POA: Diagnosis not present

## 2017-05-22 DIAGNOSIS — I10 Essential (primary) hypertension: Secondary | ICD-10-CM | POA: Diagnosis not present

## 2017-05-22 DIAGNOSIS — R1011 Right upper quadrant pain: Secondary | ICD-10-CM | POA: Diagnosis not present

## 2017-05-22 DIAGNOSIS — K219 Gastro-esophageal reflux disease without esophagitis: Secondary | ICD-10-CM | POA: Diagnosis not present

## 2017-05-22 DIAGNOSIS — E119 Type 2 diabetes mellitus without complications: Secondary | ICD-10-CM | POA: Diagnosis not present

## 2017-05-22 DIAGNOSIS — K900 Celiac disease: Secondary | ICD-10-CM | POA: Diagnosis present

## 2017-05-22 MED ORDER — SODIUM CHLORIDE 0.9 % IV SOLN: 500.0000 mL | Freq: Once | INTRAVENOUS | Status: DC

## 2017-05-22 NOTE — Op Note (Signed)
[facility] Patient Name: Jessica [name] Procedure Date: 05/22/2017 8:02 AM MRN: [id number] Endoscopist: Jerene Bears , MD Age: 67 Referring MD:  Date of Birth: 12/31/1950 Gender: Female Account #: [id number] Procedure:                Upper GI endoscopy Indications:              Celiac disease, Nausea Medicines:                Monitored Anesthesia Care Procedure:                Pre-Anesthesia Assessment:                           - Prior to the procedure, a History and Physical                            was performed, and patient medications and                            allergies were reviewed. The patient's tolerance of                            previous anesthesia was also reviewed. The risks                            and benefits of the procedure and the sedation                            options and risks were discussed with the patient.                            All questions were answered, and informed consent                            was obtained. Prior Anticoagulants: The patient has                            taken no previous anticoagulant or antiplatelet                            agents. ASA Grade Assessment: III - A patient with                            severe systemic disease. After reviewing the risks                            and benefits, the patient was deemed in                            satisfactory condition to undergo the procedure.                           After obtaining informed consent, the endoscope was  passed under direct vision. Throughout the                            procedure, the patient's blood pressure, pulse, and                            oxygen saturations were monitored continuously. The                            Endoscope was introduced through the mouth, and                            advanced to the second part of duodenum. The upper                            GI endoscopy was accomplished  without difficulty.                            The patient tolerated the procedure well. Scope In: Scope Out: Findings:                 The examined esophagus was normal. Z-line regular                            at 40 cm.                           A single 6 mm mucosal papule (nodule) with no                            bleeding was found in the prepyloric region of the                            stomach. Biopsies were taken with a cold forceps                            for histology.                           The exam of the stomach was otherwise normal.                           The examined duodenum was normal. Biopsies for                            histology were taken with a cold forceps for                            evaluation of celiac disease. Complications:            No immediate complications. Estimated Blood Loss:     Estimated blood loss was minimal. Impression:               - Normal esophagus.                           -  A single mucosal papule (nodule) found in the                            stomach. Biopsied.                           - Normal examined duodenum. Biopsied. Recommendation:           - Patient has a contact number available for                            emergencies. The signs and symptoms of potential                            delayed complications were discussed with the                            patient. Return to normal activities tomorrow.                            Written discharge instructions were provided to the                            patient.                           - Resume previous gluten-free diet.                           - Continue present medications.                           - Await pathology results. Jerene Bears, MD 05/22/2017 8:21:00 AM This report has been signed electronically.

## 2017-05-22 NOTE — Patient Instructions (Signed)
YOU HAD AN ENDOSCOPIC PROCEDURE TODAY AT [facility]:   Refer to the procedure report that was given to you for any specific questions about what was found during the examination.  If the procedure report does not answer your questions, please call your gastroenterologist to clarify.  If you requested that your care partner not be given the details of your procedure findings, then the procedure report has been included in a sealed envelope for you to review at your convenience later.  YOU SHOULD EXPECT: Some feelings of bloating in the abdomen. Passage of more gas than usual.  Walking can help get rid of the air that was put into your GI tract during the procedure and reduce the bloating. If you had a lower endoscopy (such as a colonoscopy or flexible sigmoidoscopy) you may notice spotting of blood in your stool or on the toilet paper. If you underwent a bowel prep for your procedure, you may not have a normal bowel movement for a few days.  Please Note:  You might notice some irritation and congestion in your nose or some drainage.  This is from the oxygen used during your procedure.  There is no need for concern and it should clear up in a day or so.  SYMPTOMS TO REPORT IMMEDIATELY:   FFollowing upper endoscopy (EGD)  Vomiting of blood or coffee ground material  New chest pain or pain under the shoulder blades  Painful or persistently difficult swallowing  New shortness of breath  Fever of 100F or higher  Black, tarry-looking stools  For urgent or emergent issues, a gastroenterologist can be reached at any hour by calling [phone].   DIET:  We do recommend a small meal at first, but then you may proceed to your regular diet.  Drink plenty of fluids but you should avoid alcoholic beverages for 24 hours.  ACTIVITY:  You should plan to take it easy for the rest of today and you should NOT DRIVE or use heavy machinery until tomorrow (because of the sedation medicines used  during the test).    FOLLOW UP: Our staff will call the number listed on your records the next business day following your procedure to check on you and address any questions or concerns that you may have regarding the information given to you following your procedure. If we do not reach you, we will leave a message.  However, if you are feeling well and you are not experiencing any problems, there is no need to return our call.  We will assume that you have returned to your regular daily activities without incident.  If any biopsies were taken you will be contacted by phone or by letter within the next 1-3 weeks.  Please call us at [phone] if you have not heard about the biopsies in 3 weeks.    SIGNATURES/CONFIDENTIALITY: You and/or your care partner have signed paperwork which will be entered into your electronic medical record.  These signatures attest to the fact that that the information above on your After Visit Summary has been reviewed and is understood.  Full responsibility of the confidentiality of this discharge information lies with you and/or your care-partner.

## 2017-05-22 NOTE — Progress Notes (Signed)
Report given to PACU, vss 

## 2017-05-22 NOTE — Progress Notes (Signed)
Called to room to assist during endoscopic procedure.  Patient ID and intended procedure confirmed with present staff. Received instructions for my participation in the procedure from the performing physician.  

## 2017-05-23 ENCOUNTER — Telehealth: Payer: Self-pay

## 2017-05-23 NOTE — Telephone Encounter (Signed)
  Follow up Call-  Call back number 05/22/2017  Post procedure Call Back phone  # [phone]  Permission to leave phone message Yes  Some recent data might be hidden     Patient questions:  Do you have a fever, pain , or abdominal swelling? Yes.   Pain Score  1 *  Have you tolerated food without any problems? Yes.    Have you been able to return to your normal activities? Yes.    Do you have any questions about your discharge instructions: Diet   No. Medications  No. Follow up visit  No.  Do you have questions or concerns about your Care? No.  Actions: * If pain score is 4 or above: No action needed, pain <4.

## 2017-05-23 NOTE — Progress Notes (Signed)
[name] [address] [place], [place] [address] Phone: [phone] Subjective:       CC: Neck pain  [id number]  Jessica [name] is a 67 y.o. female coming in with complaint of pain.  Has not seen patient for almost 2 years for this.  Patient did have x-rays previously March 2017.  This was independently visualized by me showing diffuse multilevel degenerative changes most prominent at C4 through C6.  Patient states she has right side neck pain that radiates down her arm. Has been going up for a week. She has numbness down her arm. Also stated her lips get numb. Last night she had cramps in her hip and leg. Using her hand makes it worse.Pain keeps her up at night. Using heat and ice. Been taking Advil. Dull ache that makes her feel nauseas.      Past Medical History:  Diagnosis Date  . Anemia    borderline  . Anxiety   . Arthritis    bilateral knees  . Asthma    childhood  . Autonomic neuropathy    diabetic  . Cataract   . Celiac disease   . Chest pain    intermittant left sided chest pain, non-exertional, lasting 1-2 min  . Depression   . Diabetes mellitus    type II, Hemoglobin A1C 9.9 10/05/2011  . Diabetic neuropathy ([place])   . Diverticulosis   . Fatty liver   . Gastroparesis   . GERD (gastroesophageal reflux disease)   . Heart murmur   . Hemorrhoids   . Hyperlipidemia   . Hypertension   . IBS (irritable bowel syndrome)   . Narrowing of airway   . Nasal congestion current   sore throat, ears-on antibiotic  . Personal history of colonic polyps 03/2010   hyperplastic  . Pneumonia 11/03/2015   current  . PONV (postoperative nausea and vomiting)   . Recurrent upper respiratory infection (URI)    bronchitis  . Shortness of breath dyspnea   . Unspecified gastritis and gastroduodenitis without mention of hemorrhage    Past Surgical History:  Procedure Laterality Date  . APPENDECTOMY    . COLONOSCOPY    . DILATATION &  CURRETTAGE/HYSTEROSCOPY WITH RESECTOCOPE N/A 03/24/2013   Procedure: DILATATION & CURETTAGE, HYSTEROSCOPY WITH RESECTION;  Surgeon: Olga Millers, MD;  Location: [facility];  Service: Gynecology;  Laterality: N/A;  . HYSTEROSCOPY W/D&C N/A 11/08/2015   Procedure: DILATATION AND CURETTAGE /HYSTEROSCOPY;  Surgeon: Olga Millers, MD;  Location: [facility];  Service: Gynecology;  Laterality: N/A;  . left breast cyst removal  2000  . TUBAL LIGATION     Social History   Socioeconomic History  . Marital status: Married    Spouse name: Devar Geisinger  . Number of children: 2  . Years of education: Not on file  . Highest education level: Not on file  Social Needs  . Financial resource strain: Not on file  . Food insecurity - worry: Not on file  . Food insecurity - inability: Not on file  . Transportation needs - medical: Not on file  . Transportation needs - non-medical: Not on file  Occupational History  . Occupation: works for Psychiatrist: RETIRED  Tobacco Use  . Smoking status: Never Smoker  . Smokeless tobacco: Never Used  Substance and Sexual Activity  . Alcohol use: No  . Drug use: No  . Sexual activity: Yes    Birth control/protection: Post-menopausal, Surgical  Other Topics Concern  . Not on file  Social History Narrative  . Not on file   Allergies  Allergen Reactions  . Gluten Meal Other (See Comments)    No wheat or soy  . Nsaids Nausea And Vomiting    Cannot tolerate large doses    Family History  Problem Relation Age of Onset  . Colon cancer Sister   . Diabetes Sister   . Diabetes Brother   . Heart disease Father   . Other Father        2 collapsed lungs     Past medical history, social, surgical and family history all reviewed in electronic medical record.  No pertanent information unless stated regarding to the chief complaint.   Review of Systems:Review of systems updated and as accurate as of 05/23/17  No  visual changes, nausea, vomiting,  diarrhea, constipation, dizziness, abdominal pain, skin rash, fevers, chills, night sweats, weight loss, swollen lymph nodes, body aches, joint swelling, chest pain, shortness of breath, mood changes.  Positive muscle aches and headaches  Objective  There were no vitals taken for this visit. Systems examined below as of 05/23/17   General: No apparent distress alert and oriented x3 mood and affect normal, dressed appropriately.  HEENT: Pupils equal, extraocular movements intact  Respiratory: Patient's speak in full sentences and does not appear short of breath  Cardiovascular: No lower extremity edema, non tender, no erythema  Skin: Warm dry intact with no signs of infection or rash on extremities or on axial skeleton.  Abdomen: Soft nontender  Neuro: Cranial nerves II through XII are intact, neurovascularly intact in all extremities with 2+ DTRs and 2+ pulses.  Lymph: No lymphadenopathy of posterior or anterior cervical chain or axillae bilaterally.  Gait normal with good balance and coordination.  MSK:  Non tender with full range of motion and good stability and symmetric strength and tone of shoulders, elbows, wrist, hip, knee and ankles bilaterally.  Mild arthritic changes of multiple joints  Neck: Inspection loss of lordosis. No palpable stepoffs. Positive Spurling's maneuver C6 distribution on the right. Limited right-sided rotation and side bending Decreased strength on the right side with 4 out of 5 in the C6 distribution No sensory change to C4 to T1 Negative Hoffman sign bilaterally Reflexes normal    Impression and Recommendations:     This case required medical decision making of moderate complexity.      Note: This dictation was prepared with Dragon dictation along with smaller phrase technology. Any transcriptional errors that result from this process are unintentional.

## 2017-05-24 ENCOUNTER — Ambulatory Visit (INDEPENDENT_AMBULATORY_CARE_PROVIDER_SITE_OTHER): Payer: Medicare Other | Admitting: Family Medicine

## 2017-05-24 ENCOUNTER — Encounter: Payer: Self-pay | Admitting: Family Medicine

## 2017-05-24 VITALS — BP 150/78 | HR 84 | Ht 65.0 in | Wt 246.0 lb

## 2017-05-24 DIAGNOSIS — M2550 Pain in unspecified joint: Secondary | ICD-10-CM | POA: Diagnosis not present

## 2017-05-24 DIAGNOSIS — M5010 Cervical disc disorder with radiculopathy, unspecified cervical region: Secondary | ICD-10-CM

## 2017-05-24 MED ORDER — KETOROLAC TROMETHAMINE 60 MG/2ML IM SOLN
60.0000 mg | Freq: Once | INTRAMUSCULAR | Status: AC
  Administered 2017-05-24: 60 mg via INTRAMUSCULAR

## 2017-05-24 MED ORDER — GABAPENTIN 400 MG PO CAPS: 400.0000 mg | ORAL_CAPSULE | Freq: Three times a day (TID) | ORAL | 3 refills | Status: DC

## 2017-05-24 MED ORDER — METHYLPREDNISOLONE ACETATE 80 MG/ML IJ SUSP
80.0000 mg | Freq: Once | INTRAMUSCULAR | Status: AC
  Administered 2017-05-24: 80 mg via INTRAMUSCULAR

## 2017-05-24 NOTE — Patient Instructions (Signed)
Good to see you  I think it is all coming from your neck.  Gabapentin now 452m Take 1 in AM and 2 at PM  2 injections today  See me again with in 2 weeks. IF not better we will need MRI.  If better you will get exercises or we will consider physical therapy

## 2017-05-24 NOTE — Assessment & Plan Note (Signed)
Known history of degenerative disc disease of the cervical spine.  We discussed icing regimen.  Patient started.  Given to injections.  Home exercises given.  Follow-up again within 2 weeks.  Worsening symptoms advanced imaging would be warranted.  Patient is in agreement with the plan.  Nose if worsening symptoms to seek medical attention immediately

## 2017-05-25 ENCOUNTER — Encounter: Payer: Self-pay | Admitting: Internal Medicine

## 2017-05-30 ENCOUNTER — Ambulatory Visit: Payer: Medicare Other | Admitting: Family Medicine

## 2017-06-04 ENCOUNTER — Telehealth: Payer: Self-pay | Admitting: Internal Medicine

## 2017-06-04 NOTE — Telephone Encounter (Signed)
Patient wanting to go over procedure results from 1.22.19 due to not understanding all of results letter.

## 2017-06-05 NOTE — Telephone Encounter (Signed)
Left message for pt to call back  °

## 2017-06-05 NOTE — Telephone Encounter (Signed)
Reviewed results letter with pt and questions were answered.

## 2017-06-10 NOTE — Progress Notes (Signed)
[name] [address] [place], [place] [address] Phone: [phone] Subjective:    I'm seeing this patient by the request  of:    CC: Neck pain follow-up  [id number]  Jessica [name] is a 67 y.o. female coming in with complaint of neck pain.  Found to have a C6 right-sided radicular symptoms.  Patient given Toradol and Depo-Medrol, started on gabapentin.  Patient states  Last x-rays were from July 27, 2015 showing diffuse multilevel degenerative changes most prominent at C4 through C6. She is doing much better since last visit. She said if she uses her neck a lot she will have pain but that it is improved.  We did state that she is 80-90% better.  Past Medical History:  Diagnosis Date  . Anemia    borderline  . Anxiety   . Arthritis    bilateral knees  . Asthma    childhood  . Autonomic neuropathy    diabetic  . Cataract   . Celiac disease   . Chest pain    intermittant left sided chest pain, non-exertional, lasting 1-2 min  . Depression   . Diabetes mellitus    type II, Hemoglobin A1C 9.9 10/05/2011  . Diabetic neuropathy ([place])   . Diverticulosis   . Fatty liver   . Gastroparesis   . GERD (gastroesophageal reflux disease)   . Heart murmur   . Hemorrhoids   . Hyperlipidemia   . Hypertension   . IBS (irritable bowel syndrome)   . Narrowing of airway   . Nasal congestion current   sore throat, ears-on antibiotic  . Personal history of colonic polyps 03/2010   hyperplastic  . Pneumonia 11/03/2015   current  . PONV (postoperative nausea and vomiting)   . Recurrent upper respiratory infection (URI)    bronchitis  . Shortness of breath dyspnea   . Unspecified gastritis and gastroduodenitis without mention of hemorrhage    Past Surgical History:  Procedure Laterality Date  . APPENDECTOMY    . COLONOSCOPY    . DILATATION & CURRETTAGE/HYSTEROSCOPY WITH RESECTOCOPE N/A 03/24/2013   Procedure: DILATATION & CURETTAGE, HYSTEROSCOPY  WITH RESECTION;  Surgeon: Olga Millers, MD;  Location: [facility];  Service: Gynecology;  Laterality: N/A;  . HYSTEROSCOPY W/D&C N/A 11/08/2015   Procedure: DILATATION AND CURETTAGE /HYSTEROSCOPY;  Surgeon: Olga Millers, MD;  Location: [facility];  Service: Gynecology;  Laterality: N/A;  . left breast cyst removal  2000  . TUBAL LIGATION     Social History   Socioeconomic History  . Marital status: Married    Spouse name: Devar Mersereau  . Number of children: 2  . Years of education: None  . Highest education level: None  Social Needs  . Financial resource strain: None  . Food insecurity - worry: None  . Food insecurity - inability: None  . Transportation needs - medical: None  . Transportation needs - non-medical: None  Occupational History  . Occupation: works for Psychiatrist: RETIRED  Tobacco Use  . Smoking status: Never Smoker  . Smokeless tobacco: Never Used  Substance and Sexual Activity  . Alcohol use: No  . Drug use: No  . Sexual activity: Yes    Birth control/protection: Post-menopausal, Surgical  Other Topics Concern  . None  Social History Narrative  . None   Allergies  Allergen Reactions  . Gluten Meal Other (See Comments)    No wheat or soy  . Nsaids  Nausea And Vomiting    Cannot tolerate large doses    Family History  Problem Relation Age of Onset  . Colon cancer Sister   . Diabetes Sister   . Diabetes Brother   . Heart disease Father   . Other Father        2 collapsed lungs     Past medical history, social, surgical and family history all reviewed in electronic medical record.  No pertanent information unless stated regarding to the chief complaint.   Review of Systems:Review of systems updated and as accurate as of 06/11/17  No headache, visual changes, nausea, vomiting, diarrhea, constipation, dizziness, abdominal pain, skin rash, fevers, chills, night sweats, weight loss, swollen lymph nodes, body aches, joint swelling, muscle  aches, chest pain, shortness of breath, mood changes.   Objective  Blood pressure 132/84, pulse 77, height 5' 5"  (1.651 m), weight 250 lb (113.4 kg), SpO2 97 %. Systems examined below as of 06/11/17   General: No apparent distress alert and oriented x3 mood and affect normal, dressed appropriately.  HEENT: Pupils equal, extraocular movements intact  Respiratory: Patient's speak in full sentences and does not appear short of breath  Cardiovascular: No lower extremity edema, non tender, no erythema  Skin: Warm dry intact with no signs of infection or rash on extremities or on axial skeleton.  Abdomen: Soft nontender  Neuro: Cranial nerves II through XII are intact, neurovascularly intact in all extremities with 2+ DTRs and 2+ pulses.  Lymph: No lymphadenopathy of posterior or anterior cervical chain or axillae bilaterally.  Gait normal with good balance and coordination.  MSK:  Non tender with full range of motion and good stability and symmetric strength and tone of shoulders, elbows, wrist, hip, knee and ankles bilaterally.  Neck: Inspection mild loss of lordosis. No palpable stepoffs. Negative Spurling's maneuver. Mild limitation lacking last 5 degrees of extension Grip strength and sensation normal in bilateral hands Strength good C4 to T1 distribution No sensory change to C4 to T1 Negative Hoffman sign bilaterally Reflexes normal    Impression and Recommendations:     This case required medical decision making of moderate complexity.      Note: This dictation was prepared with Dragon dictation along with smaller phrase technology. Any transcriptional errors that result from this process are unintentional.

## 2017-06-11 ENCOUNTER — Encounter: Payer: Self-pay | Admitting: Family Medicine

## 2017-06-11 ENCOUNTER — Ambulatory Visit (INDEPENDENT_AMBULATORY_CARE_PROVIDER_SITE_OTHER): Payer: Medicare Other | Admitting: Family Medicine

## 2017-06-11 DIAGNOSIS — M5010 Cervical disc disorder with radiculopathy, unspecified cervical region: Secondary | ICD-10-CM

## 2017-06-11 NOTE — Assessment & Plan Note (Signed)
Patient is doing well after the trigger point injections.  We discussed icing regimen and home exercises.  Patient is feeling significantly better at this time.  Follow-up with me in 6 weeks.  Patient can take gabapentin 400 mg twice daily just fine.

## 2017-06-12 ENCOUNTER — Other Ambulatory Visit: Payer: Self-pay

## 2017-06-12 ENCOUNTER — Other Ambulatory Visit: Payer: Self-pay | Admitting: Endocrinology

## 2017-06-12 MED ORDER — ATORVASTATIN CALCIUM 40 MG PO TABS: 40.0000 mg | ORAL_TABLET | Freq: Every day | ORAL | 0 refills | Status: DC

## 2017-06-16 ENCOUNTER — Other Ambulatory Visit: Payer: Self-pay | Admitting: Internal Medicine

## 2017-06-18 ENCOUNTER — Other Ambulatory Visit: Payer: Self-pay | Admitting: Endocrinology

## 2017-06-25 ENCOUNTER — Telehealth: Payer: Self-pay | Admitting: Endocrinology

## 2017-06-25 NOTE — Telephone Encounter (Signed)
Pt states she had an sinus infection, she said she still has headache, cough and runny nose. She would like to know if Dr Loanne Drilling can send anything in for this,     Please advise  [id number] [place]  - [id number]

## 2017-06-25 NOTE — Telephone Encounter (Signed)
Options: Non-prescription claritin-d, or: ov tuesday

## 2017-06-25 NOTE — Telephone Encounter (Signed)
Please advise on below  

## 2017-06-26 NOTE — Telephone Encounter (Signed)
LVM stating for pt to call office back for the appt otherwise take OTC medicaiton listed below

## 2017-07-11 ENCOUNTER — Other Ambulatory Visit: Payer: Self-pay | Admitting: Endocrinology

## 2017-07-11 ENCOUNTER — Telehealth: Payer: Self-pay | Admitting: Endocrinology

## 2017-07-11 NOTE — Telephone Encounter (Signed)
Patient called a couple weeks ago and was told to take Claritin D.  Patient said the Claritin D didn't work.  Patient has a scratchy throat,headache,body aches,ear pain.  Dr.Ellison's schedule is full until 07/24/17.  Patient asked if Dr.Ellison will call her in something to [facility].

## 2017-07-12 NOTE — Telephone Encounter (Signed)
Patient cannot come in that early because of work. I have looked & I don't see where a spot would be open, but patient wanted to me to ask if you could approve an opening early next week after 2pm?

## 2017-07-12 NOTE — Telephone Encounter (Signed)
Options: Sat at [place], or: 07/17/17, at 3:15 PM

## 2017-07-12 NOTE — Telephone Encounter (Signed)
LVM for patient to call back to see if she could come in tomorrow at 10:15. If she can I will have to get an opening approved. I asked patient to try to call back ASAP.

## 2017-07-12 NOTE — Telephone Encounter (Signed)
10:15 AM, 07/13/17

## 2017-07-13 NOTE — Telephone Encounter (Signed)
Patient called and does want to take 07/17/17 slot at 3:15. I will Colletta [place] to approve & schedule.

## 2017-07-13 NOTE — Telephone Encounter (Signed)
LVM for patient to call back to see if she wanted the 07/17/17 3:15 appt.

## 2017-07-17 ENCOUNTER — Encounter: Payer: Self-pay | Admitting: Endocrinology

## 2017-07-17 ENCOUNTER — Ambulatory Visit (INDEPENDENT_AMBULATORY_CARE_PROVIDER_SITE_OTHER): Payer: Medicare Other | Admitting: Endocrinology

## 2017-07-17 ENCOUNTER — Other Ambulatory Visit: Payer: Self-pay | Admitting: *Deleted

## 2017-07-17 VITALS — BP 123/78 | HR 63 | Wt 254.0 lb

## 2017-07-17 DIAGNOSIS — Z794 Long term (current) use of insulin: Secondary | ICD-10-CM

## 2017-07-17 DIAGNOSIS — E1143 Type 2 diabetes mellitus with diabetic autonomic (poly)neuropathy: Secondary | ICD-10-CM

## 2017-07-17 LAB — HEMOGLOBIN A1C: Hgb A1c MFr Bld: 7.1 % — ABNORMAL HIGH (ref 4.6–6.5)

## 2017-07-17 MED ORDER — CEFUROXIME AXETIL 250 MG PO TABS: 250.0000 mg | ORAL_TABLET | Freq: Two times a day (BID) | ORAL | 0 refills | Status: AC

## 2017-07-17 MED ORDER — GABAPENTIN 400 MG PO CAPS: 400.0000 mg | ORAL_CAPSULE | Freq: Three times a day (TID) | ORAL | 1 refills | Status: DC

## 2017-07-17 NOTE — Telephone Encounter (Signed)
Refill done.  

## 2017-07-17 NOTE — Progress Notes (Signed)
Subjective:    Patient ID: Jessica [name], female    DOB: 17-Oct-1950, 67 y.o.   MRN: [id number]  HPI  Pt returns for f/u of diabetes mellitus:  DM type: Insulin-requiring type 2. Dx'ed: 2000.  Complications: polyneuropathy and autonomic neuropathy.   Therapy: insulin since 2005 GDM: never DKA: never Severe hypoglycemia: never.  Pancreatitis: never.  Other: Pt says her ability to care for her DM continues to be compromised by her husband's illness; she has done better on a BID insulin regimen; she takes human insulin, due to cost.   Interval history: She takes 110 units qam and 50 qpm.  She has occasional mild hypoglycemia in the middle of the night (less commonly in the afternoon).   2 weeks of slight pain at both ears, and assoc nasal congestion.  Claritin-D did not help.   Past Medical History:  Diagnosis Date  . Anemia    borderline  . Anxiety   . Arthritis    bilateral knees  . Asthma    childhood  . Autonomic neuropathy    diabetic  . Cataract   . Celiac disease   . Chest pain    intermittant left sided chest pain, non-exertional, lasting 1-2 min  . Depression   . Diabetes mellitus    type II, Hemoglobin A1C 9.9 10/05/2011  . Diabetic neuropathy ([place])   . Diverticulosis   . Fatty liver   . Gastroparesis   . GERD (gastroesophageal reflux disease)   . Heart murmur   . Hemorrhoids   . Hyperlipidemia   . Hypertension   . IBS (irritable bowel syndrome)   . Narrowing of airway   . Nasal congestion current   sore throat, ears-on antibiotic  . Personal history of colonic polyps 03/2010   hyperplastic  . Pneumonia 11/03/2015   current  . PONV (postoperative nausea and vomiting)   . Recurrent upper respiratory infection (URI)    bronchitis  . Shortness of breath dyspnea   . Unspecified gastritis and gastroduodenitis without mention of hemorrhage     Past Surgical History:  Procedure Laterality Date  . APPENDECTOMY    . COLONOSCOPY    . DILATATION &  CURRETTAGE/HYSTEROSCOPY WITH RESECTOCOPE N/A 03/24/2013   Procedure: DILATATION & CURETTAGE, HYSTEROSCOPY WITH RESECTION;  Surgeon: Olga Millers, MD;  Location: [facility];  Service: Gynecology;  Laterality: N/A;  . HYSTEROSCOPY W/D&C N/A 11/08/2015   Procedure: DILATATION AND CURETTAGE /HYSTEROSCOPY;  Surgeon: Olga Millers, MD;  Location: [facility];  Service: Gynecology;  Laterality: N/A;  . left breast cyst removal  2000  . TUBAL LIGATION      Social History   Socioeconomic History  . Marital status: Married    Spouse name: Devar Seever  . Number of children: 2  . Years of education: Not on file  . Highest education level: Not on file  Occupational History  . Occupation: works for Psychiatrist: RETIRED  Social Needs  . Financial resource strain: Not on file  . Food insecurity:    Worry: Not on file    Inability: Not on file  . Transportation needs:    Medical: Not on file    Non-medical: Not on file  Tobacco Use  . Smoking status: Never Smoker  . Smokeless tobacco: Never Used  Substance and Sexual Activity  . Alcohol use: No  . Drug use: No  . Sexual activity: Yes    Birth control/protection: Post-menopausal, Surgical  Lifestyle  .  Physical activity:    Days per week: Not on file    Minutes per session: Not on file  . Stress: Not on file  Relationships  . Social connections:    Talks on phone: Not on file    Gets together: Not on file    Attends religious service: Not on file    Active member of club or organization: Not on file    Attends meetings of clubs or organizations: Not on file    Relationship status: Not on file  . Intimate partner violence:    Fear of current or ex partner: Not on file    Emotionally abused: Not on file    Physically abused: Not on file    Forced sexual activity: Not on file  Other Topics Concern  . Not on file  Social History Narrative  . Not on file    Current Outpatient Medications on File Prior to Visit    Medication Sig Dispense Refill  . albuterol (PROVENTIL HFA;VENTOLIN HFA) 108 (90 Base) MCG/ACT inhaler Inhale into the lungs every 6 (six) hours as needed for wheezing or shortness of breath.    Marland Kitchen atorvastatin (LIPITOR) 40 MG tablet Take 1 tablet (40 mg total) by mouth daily. 30 tablet 0  . B-D INS SYR ULTRAFINE 1CC/31G 31G X 5/16" 1 ML MISC USE UP TO 3 A DAY 100 each 5  . dexlansoprazole (DEXILANT) 60 MG capsule Take 1 capsule (60 mg total) daily by mouth. 30 capsule 3  . fluocinonide cream (LIDEX) 1.91 % Apply 1 application topically 4 (four) times daily. As needed for rash 30 g 1  . Fluticasone-Salmeterol (ADVAIR) 100-50 MCG/DOSE AEPB Inhale 1 puff into the lungs 2 (two) times daily. 1 each 3  . hyoscyamine (LEVSIN SL) 0.125 MG SL tablet Take 1 tablet (0.125 mg total) every 4 (four) hours as needed by mouth. 30 tablet 3  . insulin NPH-regular Human (HUMULIN 70/30) (70-30) 100 UNIT/ML injection INJECT 110 UNITS WITH BREAKFAST AND 50 UNITS WITH EVENING MEAL 60 mL 5  . ketoconazole (NIZORAL) 2 % cream APPLY TO AFFECTED AREA EVERY DAY 30 g 2  . losartan-hydrochlorothiazide (HYZAAR) 100-12.5 MG tablet Take 1 tablet by mouth daily. 90 tablet 3  . ONE TOUCH ULTRA TEST test strip TEST BLOOD SUGAR 2 TIMES DAILY 100 each 4  . polyethylene glycol powder (GLYCOLAX/MIRALAX) powder Dissolve 1 capful in at least 8 ounces water/juice and drink twice daily 1020 g 3  . ranitidine (ZANTAC) 150 MG tablet TAKE 1 TABLET AT BEDTIME 90 tablet 2  . valACYclovir (VALTREX) 500 MG tablet TAKE 1 TABLET BY MOUTH EVERY DAY 60 tablet 3  . atorvastatin (LIPITOR) 40 MG tablet TAKE 1 TABLET BY MOUTH EVERY DAY 30 tablet 0   Current Facility-Administered Medications on File Prior to Visit  Medication Dose Route Frequency Provider Last Rate Last Dose  . 0.9 %  sodium chloride infusion  500 mL Intravenous Once Pyrtle, Lajuan Lines, MD        Allergies  Allergen Reactions  . Gluten Meal Other (See Comments)    No wheat or soy  .  Nsaids Nausea And Vomiting    Cannot tolerate large doses     Family History  Problem Relation Age of Onset  . Colon cancer Sister   . Diabetes Sister   . Diabetes Brother   . Heart disease Father   . Other Father        2 collapsed lungs    BP 123/78 (  BP Location: Left Wrist, Patient Position: Sitting, Cuff Size: Normal)   Pulse 63   Wt 254 lb (115.2 kg)   SpO2 95%   BMI 42.27 kg/m    Review of Systems She has prod cough, but no fever or sob.      Objective:   Physical Exam VITAL SIGNS:  See vs page GENERAL: no distress head: no deformity  eyes: no periorbital swelling, no proptosis.   external nose and ears are normal  mouth: no lesion seen.   Both eac's and tm's are normal.   LUNGS:  Clear to auscultation.       Assessment & Plan:  Insulin-requiring type 2 DM: due for recheck Earache, new  Patient Instructions  I have sent a prescription to your pharmacy, for an antibiotic pill. Also take Flonase spray blood tests are requested for you today.  We'll let you know about the results. I hope you feel better soon.  If you don't feel better by next week, please call back, so I can refer you to a specialist.   Please come back for a regular physical appointment in 3 months

## 2017-07-17 NOTE — Patient Instructions (Addendum)
I have sent a prescription to your pharmacy, for an antibiotic pill. Also take Flonase spray blood tests are requested for you today.  We'll let you know about the results. I hope you feel better soon.  If you don't feel better by next week, please call back, so I can refer you to a specialist.   Please come back for a regular physical appointment in 3 months

## 2017-07-21 ENCOUNTER — Other Ambulatory Visit: Payer: Self-pay | Admitting: Internal Medicine

## 2017-07-27 ENCOUNTER — Other Ambulatory Visit: Payer: Self-pay | Admitting: Internal Medicine

## 2017-08-14 ENCOUNTER — Other Ambulatory Visit: Payer: Self-pay | Admitting: Endocrinology

## 2017-09-06 ENCOUNTER — Other Ambulatory Visit: Payer: Self-pay | Admitting: Endocrinology

## 2017-09-20 ENCOUNTER — Telehealth: Payer: Self-pay | Admitting: Endocrinology

## 2017-09-20 NOTE — Telephone Encounter (Signed)
9:45 am tomorrow

## 2017-09-20 NOTE — Telephone Encounter (Signed)
Patient stated she is unsure what doctor she needed to call   She has been having sharp abdominal pain in her right side  She states it is a constant pain but is much worse when she is walking. She states pain started 2 days ago and patient has been taking Advil which helps. She states she hasn't had any nausea or constipation . So she is not sure what is going on and would like some advice on what she should do.  Please advise

## 2017-09-20 NOTE — Telephone Encounter (Signed)
Please advise 

## 2017-09-21 NOTE — Telephone Encounter (Signed)
LVM for patient to call back before noon to see if she can be fit in. Otherwise I advised urgent care.

## 2017-09-25 NOTE — Progress Notes (Signed)
[name] [address], [place] [address] Phone: [phone] Subjective:    I'm seeing this patient by the request  of:    CC: Low back pain  [id number]  Jessica [name] is a 67 y.o. female coming in with complaint of low back pain. States that one day it just started hurting. Pain radiates to her tail bone and anterior. Makes her weak. Some times her pain is in her ribs. Numbness and tingling in right arm and legs when she lays down. Right side is worse. Holding her breath makes it feel better.   Onset- Chronic Location- Right lower back Duration-  Character- Stabbing Aggravating factors- Sitting, walking, movement Reliving factors-  Therapies tried- Advil  Severity-8 out of 10.     Past Medical History:  Diagnosis Date  . Anemia    borderline  . Anxiety   . Arthritis    bilateral knees  . Asthma    childhood  . Autonomic neuropathy    diabetic  . Cataract   . Celiac disease   . Chest pain    intermittant left sided chest pain, non-exertional, lasting 1-2 min  . Depression   . Diabetes mellitus    type II, Hemoglobin A1C 9.9 10/05/2011  . Diabetic neuropathy ([place])   . Diverticulosis   . Fatty liver   . Gastroparesis   . GERD (gastroesophageal reflux disease)   . Heart murmur   . Hemorrhoids   . Hyperlipidemia   . Hypertension   . IBS (irritable bowel syndrome)   . Narrowing of airway   . Nasal congestion current   sore throat, ears-on antibiotic  . Personal history of colonic polyps 03/2010   hyperplastic  . Pneumonia 11/03/2015   current  . PONV (postoperative nausea and vomiting)   . Recurrent upper respiratory infection (URI)    bronchitis  . Shortness of breath dyspnea   . Unspecified gastritis and gastroduodenitis without mention of hemorrhage    Past Surgical History:  Procedure Laterality Date  . APPENDECTOMY    . COLONOSCOPY    . DILATATION & CURRETTAGE/HYSTEROSCOPY WITH RESECTOCOPE N/A 03/24/2013     Procedure: DILATATION & CURETTAGE, HYSTEROSCOPY WITH RESECTION;  Surgeon: Olga Millers, MD;  Location: [facility];  Service: Gynecology;  Laterality: N/A;  . HYSTEROSCOPY W/D&C N/A 11/08/2015   Procedure: DILATATION AND CURETTAGE /HYSTEROSCOPY;  Surgeon: Olga Millers, MD;  Location: [facility];  Service: Gynecology;  Laterality: N/A;  . left breast cyst removal  2000  . TUBAL LIGATION     Social History   Socioeconomic History  . Marital status: Married    Spouse name: Devar Newcombe  . Number of children: 2  . Years of education: Not on file  . Highest education level: Not on file  Occupational History  . Occupation: works for Psychiatrist: RETIRED  Social Needs  . Financial resource strain: Not on file  . Food insecurity:    Worry: Not on file    Inability: Not on file  . Transportation needs:    Medical: Not on file    Non-medical: Not on file  Tobacco Use  . Smoking status: Never Smoker  . Smokeless tobacco: Never Used  Substance and Sexual Activity  . Alcohol use: No  . Drug use: No  . Sexual activity: Yes    Birth control/protection: Post-menopausal, Surgical  Lifestyle  . Physical activity:    Days per week: Not on file  Minutes per session: Not on file  . Stress: Not on file  Relationships  . Social connections:    Talks on phone: Not on file    Gets together: Not on file    Attends religious service: Not on file    Active member of club or organization: Not on file    Attends meetings of clubs or organizations: Not on file    Relationship status: Not on file  Other Topics Concern  . Not on file  Social History Narrative  . Not on file   Allergies  Allergen Reactions  . Gluten Meal Other (See Comments)    No wheat or soy  . Nsaids Nausea And Vomiting    Cannot tolerate large doses    Family History  Problem Relation Age of Onset  . Colon cancer Sister   . Diabetes Sister   . Diabetes Brother   . Heart disease Father   . Other Father         2 collapsed lungs     Past medical history, social, surgical and family history all reviewed in electronic medical record.  No pertanent information unless stated regarding to the chief complaint.   Review of Systems:Review of systems updated and as accurate as of 09/27/17  No headache, visual changes, nausea, vomiting, diarrhea, constipation, dizziness, skin rash, fevers, chills, night sweats, weight loss, swollen lymph nodes, body aches, joint swelling,  chest pain, shortness of breath, mood changes.  Positive abdominal pain, muscle aches  Objective  Blood pressure (!) 158/90, pulse 84, height 5' 5"  (1.651 m), weight 253 lb (114.8 kg), SpO2 95 %. Systems examined below as of 09/27/17   General: No apparent distress alert and oriented x3 mood and affect normal, dressed appropriately.  HEENT: Pupils equal, extraocular movements intact  Respiratory: Patient's speak in full sentences and does not appear short of breath  Cardiovascular: No lower extremity edema, non tender, no erythema  Skin: Warm dry intact with no signs of infection or rash on extremities or on axial skeleton.  Abdomen: Soft patient does have diffuse tenderness noted.  Worse in the right lower quadrant.  No rebound tenderness but does have some discomfort.  Bowel sounds positive Neuro: Cranial nerves II through XII are intact, neurovascularly intact in all extremities with 2+ DTRs and 2+ pulses.  Lymph: No lymphadenopathy of posterior or anterior cervical chain or axillae bilaterally.  Gait normal with good balance and coordination.  Very cautious MSK:  Non tender with full range of motion and good stability and symmetric strength and tone of shoulders, elbows, wrist, hip, knee and ankles bilaterally.   Back exam shows the patient has some loss of lordosis.  Poor core strength.  Patient does have tenderness to palpation in the paraspinal musculature lumbar spine but significantly less than the abdominal findings.   Tightness with the straight leg test.  Positive Faber    Impression and Recommendations:     This case required medical decision making of moderate complexity.      Note: This dictation was prepared with Dragon dictation along with smaller phrase technology. Any transcriptional errors that result from this process are unintentional.

## 2017-09-27 ENCOUNTER — Ambulatory Visit (INDEPENDENT_AMBULATORY_CARE_PROVIDER_SITE_OTHER)
Admission: RE | Admit: 2017-09-27 | Discharge: 2017-09-27 | Disposition: A | Discharge status: Home / Self Care | Payer: Medicare Other | Source: Ambulatory Visit | Attending: Family Medicine | Admitting: Family Medicine

## 2017-09-27 ENCOUNTER — Ambulatory Visit (INDEPENDENT_AMBULATORY_CARE_PROVIDER_SITE_OTHER): Payer: Medicare Other | Admitting: Family Medicine

## 2017-09-27 ENCOUNTER — Encounter: Payer: Self-pay | Admitting: Family Medicine

## 2017-09-27 VITALS — BP 158/90 | HR 84 | Ht 65.0 in | Wt 253.0 lb

## 2017-09-27 DIAGNOSIS — R102 Pelvic and perineal pain: Secondary | ICD-10-CM | POA: Diagnosis not present

## 2017-09-27 DIAGNOSIS — M545 Low back pain: Secondary | ICD-10-CM

## 2017-09-27 DIAGNOSIS — R109 Unspecified abdominal pain: Secondary | ICD-10-CM | POA: Diagnosis not present

## 2017-09-27 DIAGNOSIS — G8929 Other chronic pain: Secondary | ICD-10-CM

## 2017-09-27 NOTE — Patient Instructions (Signed)
Good to see you  We will get xrays downstairs today  I do need a CT scan of your stomach at the moment and they will call you  Add a stool softener to your miralax.  Probiotic 10 different strains with 10 billion units We will get into contact with you after the imaging.

## 2017-09-27 NOTE — Assessment & Plan Note (Signed)
Patient has generalized abdominal pain.  With patient having back pain with intermittent seeming where it kind of migrates I am concerned for possible diverticulitis.  History of celiac disease.  Has had difficulty with constipation as well as

## 2017-09-28 ENCOUNTER — Telehealth: Payer: Self-pay | Admitting: Endocrinology

## 2017-09-28 DIAGNOSIS — R109 Unspecified abdominal pain: Secondary | ICD-10-CM

## 2017-09-28 NOTE — Telephone Encounter (Signed)
Order changed.

## 2017-09-28 NOTE — Telephone Encounter (Signed)
p pec elam  Copied from [place] [phone]. Topic: Quick Communication - See Telephone Encounter >> Sep 28, 2017  9:34 AM Arletha Grippe wrote: CRM for notification. See Telephone encounter for: 09/28/17. Mary with [facility] called  Order for  Dr Tamala Julian needs to be CT abdomen pelvis with  Cb is [phone]   Order is from Dr Tamala Julian

## 2017-10-01 ENCOUNTER — Ambulatory Visit
Admission: RE | Admit: 2017-10-01 | Discharge: 2017-10-01 | Disposition: A | Discharge status: Home / Self Care | Payer: Medicare Other | Source: Ambulatory Visit | Attending: Family Medicine | Admitting: Family Medicine

## 2017-10-01 DIAGNOSIS — R109 Unspecified abdominal pain: Secondary | ICD-10-CM

## 2017-10-01 DIAGNOSIS — K5730 Diverticulosis of large intestine without perforation or abscess without bleeding: Secondary | ICD-10-CM | POA: Diagnosis not present

## 2017-10-01 MED ORDER — IOPAMIDOL (ISOVUE-300) INJECTION 61%
125.0000 mL | Freq: Once | INTRAVENOUS | Status: AC | PRN
  Administered 2017-10-01: 125 mL via INTRAVENOUS

## 2017-10-02 ENCOUNTER — Other Ambulatory Visit: Payer: Self-pay

## 2017-10-02 DIAGNOSIS — G629 Polyneuropathy, unspecified: Secondary | ICD-10-CM

## 2017-10-02 DIAGNOSIS — M549 Dorsalgia, unspecified: Principal | ICD-10-CM

## 2017-10-02 DIAGNOSIS — G8929 Other chronic pain: Secondary | ICD-10-CM

## 2017-10-02 DIAGNOSIS — M545 Low back pain, unspecified: Secondary | ICD-10-CM

## 2017-10-04 ENCOUNTER — Encounter: Payer: Self-pay | Admitting: Family Medicine

## 2017-10-05 ENCOUNTER — Encounter: Payer: Self-pay | Admitting: Family Medicine

## 2017-10-05 DIAGNOSIS — M48061 Spinal stenosis, lumbar region without neurogenic claudication: Secondary | ICD-10-CM

## 2017-10-05 DIAGNOSIS — M5416 Radiculopathy, lumbar region: Principal | ICD-10-CM

## 2017-10-10 ENCOUNTER — Other Ambulatory Visit: Payer: Medicare Other

## 2017-10-11 ENCOUNTER — Other Ambulatory Visit: Payer: Self-pay | Admitting: Endocrinology

## 2017-10-15 ENCOUNTER — Encounter: Payer: Self-pay | Admitting: Endocrinology

## 2017-10-15 DIAGNOSIS — H2513 Age-related nuclear cataract, bilateral: Secondary | ICD-10-CM | POA: Diagnosis not present

## 2017-10-15 DIAGNOSIS — E119 Type 2 diabetes mellitus without complications: Secondary | ICD-10-CM | POA: Diagnosis not present

## 2017-10-15 DIAGNOSIS — H43391 Other vitreous opacities, right eye: Secondary | ICD-10-CM | POA: Diagnosis not present

## 2017-10-15 DIAGNOSIS — M5416 Radiculopathy, lumbar region: Secondary | ICD-10-CM | POA: Diagnosis not present

## 2017-10-15 DIAGNOSIS — H25013 Cortical age-related cataract, bilateral: Secondary | ICD-10-CM | POA: Diagnosis not present

## 2017-10-15 DIAGNOSIS — M48061 Spinal stenosis, lumbar region without neurogenic claudication: Secondary | ICD-10-CM | POA: Diagnosis not present

## 2017-10-15 LAB — HM DIABETES EYE EXAM

## 2017-10-19 DIAGNOSIS — M48061 Spinal stenosis, lumbar region without neurogenic claudication: Secondary | ICD-10-CM | POA: Diagnosis not present

## 2017-10-19 DIAGNOSIS — M5416 Radiculopathy, lumbar region: Secondary | ICD-10-CM | POA: Diagnosis not present

## 2017-10-22 DIAGNOSIS — M5416 Radiculopathy, lumbar region: Secondary | ICD-10-CM | POA: Diagnosis not present

## 2017-10-22 DIAGNOSIS — M48061 Spinal stenosis, lumbar region without neurogenic claudication: Secondary | ICD-10-CM | POA: Diagnosis not present

## 2017-10-23 ENCOUNTER — Ambulatory Visit: Payer: Medicare Other | Admitting: Endocrinology

## 2017-10-25 ENCOUNTER — Ambulatory Visit (INDEPENDENT_AMBULATORY_CARE_PROVIDER_SITE_OTHER): Payer: Medicare Other | Admitting: Endocrinology

## 2017-10-25 ENCOUNTER — Encounter: Payer: Self-pay | Admitting: Endocrinology

## 2017-10-25 VITALS — BP 148/72 | HR 91 | Ht 65.0 in | Wt 251.2 lb

## 2017-10-25 DIAGNOSIS — Z0000 Encounter for general adult medical examination without abnormal findings: Secondary | ICD-10-CM

## 2017-10-25 DIAGNOSIS — Z794 Long term (current) use of insulin: Secondary | ICD-10-CM | POA: Diagnosis not present

## 2017-10-25 DIAGNOSIS — Z23 Encounter for immunization: Secondary | ICD-10-CM | POA: Diagnosis not present

## 2017-10-25 DIAGNOSIS — Z780 Asymptomatic menopausal state: Secondary | ICD-10-CM

## 2017-10-25 DIAGNOSIS — E1143 Type 2 diabetes mellitus with diabetic autonomic (poly)neuropathy: Secondary | ICD-10-CM | POA: Diagnosis not present

## 2017-10-25 DIAGNOSIS — D509 Iron deficiency anemia, unspecified: Secondary | ICD-10-CM | POA: Diagnosis not present

## 2017-10-25 LAB — CBC WITH DIFFERENTIAL/PLATELET
Basophils Absolute: 0 10*3/uL (ref 0.0–0.1)
Basophils Relative: 0.6 % (ref 0.0–3.0)
Eosinophils Absolute: 0.1 10*3/uL (ref 0.0–0.7)
Eosinophils Relative: 1.6 % (ref 0.0–5.0)
HCT: 41.3 % (ref 36.0–46.0)
Hemoglobin: 14.1 g/dL (ref 12.0–15.0)
Lymphocytes Relative: 24 % (ref 12.0–46.0)
Lymphs Abs: 1.4 10*3/uL (ref 0.7–4.0)
MCHC: 34.1 g/dL (ref 30.0–36.0)
MCV: 96.8 fl (ref 78.0–100.0)
Monocytes Absolute: 0.3 10*3/uL (ref 0.1–1.0)
Monocytes Relative: 5.8 % (ref 3.0–12.0)
Neutro Abs: 4.1 10*3/uL (ref 1.4–7.7)
Neutrophils Relative %: 68 % (ref 43.0–77.0)
Platelets: 246 10*3/uL (ref 150.0–400.0)
RBC: 4.27 Mil/uL (ref 3.87–5.11)
RDW: 13.1 % (ref 11.5–15.5)
WBC: 6 10*3/uL (ref 4.0–10.5)

## 2017-10-25 LAB — BASIC METABOLIC PANEL
BUN: 18 mg/dL (ref 6–23)
CO2: 30 mEq/L (ref 19–32)
Calcium: 9.5 mg/dL (ref 8.4–10.5)
Chloride: 102 mEq/L (ref 96–112)
Creatinine, Ser: 1.04 mg/dL (ref 0.40–1.20)
GFR: 67.96 mL/min (ref >60.00)
Glucose, Bld: 238 mg/dL — ABNORMAL HIGH (ref 70–99)
Potassium: 4.2 mEq/L (ref 3.5–5.1)
Sodium: 138 mEq/L (ref 135–145)

## 2017-10-25 LAB — HEPATIC FUNCTION PANEL
ALT: 12 U/L (ref 0–35)
AST: 13 U/L (ref 0–37)
Albumin: 4 g/dL (ref 3.5–5.2)
Alkaline Phosphatase: 87 U/L (ref 39–117)
Bilirubin, Direct: 0.1 mg/dL (ref 0.0–0.3)
Total Bilirubin: 0.4 mg/dL (ref 0.2–1.2)
Total Protein: 6.6 g/dL (ref 6.0–8.3)

## 2017-10-25 LAB — POCT GLYCOSYLATED HEMOGLOBIN (HGB A1C): Hemoglobin A1C: 7.2 % — AB (ref 4.0–5.6)

## 2017-10-25 LAB — LIPID PANEL
Cholesterol: 133 mg/dL (ref 0–200)
HDL: 40.6 mg/dL (ref >39.00)
LDL Cholesterol: 77 mg/dL (ref 0–99)
NonHDL: 92.05
Total CHOL/HDL Ratio: 3
Triglycerides: 75 mg/dL (ref 0.0–149.0)
VLDL: 15 mg/dL (ref 0.0–40.0)

## 2017-10-25 LAB — URINALYSIS, ROUTINE W REFLEX MICROSCOPIC
Bilirubin Urine: NEGATIVE
Hgb urine dipstick: NEGATIVE
Ketones, ur: NEGATIVE
Leukocytes, UA: NEGATIVE
Nitrite: NEGATIVE
RBC / HPF: NONE SEEN (ref 0–?)
Specific Gravity, Urine: 1.025 (ref 1.000–1.030)
Total Protein, Urine: NEGATIVE
Urine Glucose: >=1000 — AB
Urobilinogen, UA: 0.2 (ref 0.0–1.0)
WBC, UA: NONE SEEN (ref 0–?)
pH: 6 (ref 5.0–8.0)

## 2017-10-25 LAB — IBC PANEL
Iron: 74 ug/dL (ref 42–145)
Saturation Ratios: 19.9 % — ABNORMAL LOW (ref 20.0–50.0)
Transferrin: 265 mg/dL (ref 212.0–360.0)

## 2017-10-25 LAB — MICROALBUMIN / CREATININE URINE RATIO
Creatinine,U: 132.5 mg/dL
Microalb Creat Ratio: 0.5 mg/g (ref 0.0–30.0)
Microalb, Ur: 0.7 mg/dL (ref 0.0–1.9)

## 2017-10-25 LAB — TSH: TSH: 1.27 u[IU]/mL (ref 0.35–4.50)

## 2017-10-25 NOTE — Patient Instructions (Addendum)
Please continue the same insulin.   Please come back for a follow-up appointment in 4 months.  check your blood sugar twice a day.  vary the time of day when you check, between before the 3 meals, and at bedtime.  also check if you have symptoms of your blood sugar being too high or too low.  please keep a record of the readings and bring it to your next appointment here (or you can bring the meter itself).  You can write it on any piece of paper.  please call us sooner if your blood sugar goes below 70, or if you have a lot of readings over 200. blood tests are requested for you today.  We'll let you know about the results.   Loratadine-d (non-prescription) will help your congestion and sore throat.   good diet and exercise significantly improve the control of your diabetes.  please let me know if you wish to be referred to a dietician.  high blood sugar is very risky to your health.  you should see an eye doctor and dentist every year.  It is very important to get all recommended vaccinations.  Please consider these measures for your health:  minimize alcohol.  Do not use tobacco products.  Have a colonoscopy at least every 10 years from age 74.  Women should have an annual mammogram from age 2.  Keep firearms safely stored.  Always use seat belts.  have working smoke alarms in your home.  See an eye doctor and dentist regularly.  Never drive under the influence of alcohol or drugs (including prescription drugs).  please let me know what your wishes It is critically important to prevent falling down (keep floor areas well-lit, dry, and free of loose objects.  If you have a cane, walker, or wheelchair, you should use it, even for short trips around the house.  Wear flat-soled shoes.  Also, try not to rush).

## 2017-10-25 NOTE — Progress Notes (Signed)
we discussed code status.  pt requests full code, but would not want to be started or maintained on artificial life-support measures if there was not a reasonable chance of recovery 

## 2017-10-25 NOTE — Progress Notes (Signed)
Subjective:    Patient ID: Jessica [name], female    DOB: [birth year], 67 y.o.   MRN: [id number]  HPI Pt returns for f/u of diabetes mellitus:  DM type: Insulin-requiring type 2. Dx'ed: 2000.  Complications: polyneuropathy and autonomic neuropathy.   Therapy: insulin since 2005 GDM: never DKA: never Severe hypoglycemia: never.  Pancreatitis: never.  Other: Pt says her ability to care for her DM continues to be compromised by her husband's illness; she has done better on a BID insulin regimen; she takes human insulin, due to cost.   Interval history: She takes 110 units qam and 50 qpm.  She says cbg's are well-controlled.   She has a few days of left earache, dry cough, and sore throat.   Past Medical History:  Diagnosis Date  . Anemia    borderline  . Anxiety   . Arthritis    bilateral knees  . Asthma    childhood  . Autonomic neuropathy    diabetic  . Cataract   . Celiac disease   . Chest pain    intermittant left sided chest pain, non-exertional, lasting 1-2 min  . Depression   . Diabetes mellitus    type II, Hemoglobin A1C 9.9 10/05/2011  . Diabetic neuropathy ([place])   . Diverticulosis   . Fatty liver   . Gastroparesis   . GERD (gastroesophageal reflux disease)   . Heart murmur   . Hemorrhoids   . Hyperlipidemia   . Hypertension   . IBS (irritable bowel syndrome)   . Narrowing of airway   . Nasal congestion current   sore throat, ears-on antibiotic  . Personal history of colonic polyps 03/2010   hyperplastic  . Pneumonia 11/03/2015   current  . PONV (postoperative nausea and vomiting)   . Recurrent upper respiratory infection (URI)    bronchitis  . Shortness of breath dyspnea   . Unspecified gastritis and gastroduodenitis without mention of hemorrhage     Past Surgical History:  Procedure Laterality Date  . APPENDECTOMY    . COLONOSCOPY    . DILATATION & CURRETTAGE/HYSTEROSCOPY WITH RESECTOCOPE N/A 03/24/2013   Procedure: DILATATION & CURETTAGE,  HYSTEROSCOPY WITH RESECTION;  Surgeon: Olga Millers, MD;  Location: [facility];  Service: Gynecology;  Laterality: N/A;  . HYSTEROSCOPY W/D&C N/A 11/08/2015   Procedure: DILATATION AND CURETTAGE /HYSTEROSCOPY;  Surgeon: Olga Millers, MD;  Location: [facility];  Service: Gynecology;  Laterality: N/A;  . left breast cyst removal  2000  . TUBAL LIGATION      Social History   Socioeconomic History  . Marital status: Married    Spouse name: Devar Zettlemoyer  . Number of children: 2  . Years of education: Not on file  . Highest education level: Not on file  Occupational History  . Occupation: works for Psychiatrist: RETIRED  Social Needs  . Financial resource strain: Not on file  . Food insecurity:    Worry: Not on file    Inability: Not on file  . Transportation needs:    Medical: Not on file    Non-medical: Not on file  Tobacco Use  . Smoking status: Never Smoker  . Smokeless tobacco: Never Used  Substance and Sexual Activity  . Alcohol use: No  . Drug use: No  . Sexual activity: Yes    Birth control/protection: Post-menopausal, Surgical  Lifestyle  . Physical activity:    Days per week: Not on file    Minutes per  session: Not on file  . Stress: Not on file  Relationships  . Social connections:    Talks on phone: Not on file    Gets together: Not on file    Attends religious service: Not on file    Active member of club or organization: Not on file    Attends meetings of clubs or organizations: Not on file    Relationship status: Not on file  . Intimate partner violence:    Fear of current or ex partner: Not on file    Emotionally abused: Not on file    Physically abused: Not on file    Forced sexual activity: Not on file  Other Topics Concern  . Not on file  Social History Narrative  . Not on file    Current Outpatient Medications on File Prior to Visit  Medication Sig Dispense Refill  . albuterol (PROVENTIL HFA;VENTOLIN HFA) 108 (90 Base) MCG/ACT  inhaler Inhale into the lungs every 6 (six) hours as needed for wheezing or shortness of breath.    Marland Kitchen atorvastatin (LIPITOR) 40 MG tablet Take 1 tablet (40 mg total) by mouth daily. 30 tablet 0  . B-D INS SYR ULTRAFINE 1CC/31G 31G X 5/16" 1 ML MISC USE UP TO 3 A DAY 100 each 5  . DEXILANT 60 MG capsule TAKE 1 CAPSULE DAILY 30 capsule 3  . Fluticasone-Salmeterol (ADVAIR) 100-50 MCG/DOSE AEPB Inhale 1 puff into the lungs 2 (two) times daily. 1 each 3  . gabapentin (NEURONTIN) 400 MG capsule Take 1 capsule (400 mg total) by mouth 3 (three) times daily. 270 capsule 1  . hyoscyamine (LEVSIN SL) 0.125 MG SL tablet Take 1 tablet (0.125 mg total) every 4 (four) hours as needed by mouth. 30 tablet 3  . insulin NPH-regular Human (HUMULIN 70/30) (70-30) 100 UNIT/ML injection INJECT 110 UNITS WITH BREAKFAST AND 50 UNITS WITH EVENING MEAL 60 mL 5  . ketoconazole (NIZORAL) 2 % cream APPLY TO AFFECTED AREA EVERY DAY 30 g 2  . losartan-hydrochlorothiazide (HYZAAR) 100-12.5 MG tablet Take 1 tablet by mouth daily. 90 tablet 3  . ONE TOUCH ULTRA TEST test strip TEST BLOOD SUGAR 2 TIMES DAILY 100 each 4  . polyethylene glycol powder ([facility] PURELAX) powder DISSOLVE 1 CAPFUL IN AT LEAST 8 OUNCES WATER/JUICE AND DRINK TWICE DAILY 1020 g 2  . ranitidine (ZANTAC) 150 MG tablet TAKE 1 TABLET AT BEDTIME 90 tablet 2  . valACYclovir (VALTREX) 500 MG tablet TAKE 1 TABLET BY MOUTH EVERY DAY 60 tablet 3   Current Facility-Administered Medications on File Prior to Visit  Medication Dose Route Frequency Provider Last Rate Last Dose  . 0.9 %  sodium chloride infusion  500 mL Intravenous Once Pyrtle, Lajuan Lines, MD        Allergies  Allergen Reactions  . Gluten Meal Other (See Comments)    No wheat or soy  . Nsaids Nausea And Vomiting    Cannot tolerate large doses     Family History  Problem Relation Age of Onset  . Colon cancer Sister   . Diabetes Sister   . Diabetes Brother   . Heart disease Father   . Other Father         2 collapsed lungs    BP (!) 148/72 (BP Location: Left Arm, Patient Position: Sitting, Cuff Size: Normal)   Pulse 91   Ht 5' 5"  (1.651 m)   Wt 251 lb 3.2 oz (113.9 kg)   SpO2 97%   BMI 41.80  kg/m   Review of Systems Denies fever.      Objective:   Physical Exam VITAL SIGNS:  See vs page GENERAL: no distress head: no deformity  eyes: no periorbital swelling, no proptosis  external nose and ears are normal  mouth: no lesion seen Both eac's and tm's are normal LUNGS:  Clear to auscultation.  Pulses: dorsalis pedis intact bilat.   MSK: no deformity of the feet CV: no leg edema Skin:  no ulcer on the feet.  normal color and temp on the feet. Neuro: sensation is intact to touch on the feet  Lab Results  Component Value Date   HGBA1C 7.2 (A) 10/25/2017   I personally reviewed electrocardiogram tracing (today): Indication: DM Impression: NSR.  ? Old septal MI.  No hypertrophy. Compared to 2017: no significant change      Assessment & Plan:  Insulin-requiring type 2 DM: this is the best control this pt should aim for, given this regimen, which does match insulin to her changing needs throughout the day Allergic rhinitis: I offered ref allergy. Dyslipidemia: recheck today HTN: Please continue the same medication for now.  Recheck next time.     Subjective:   Patient here for Medicare annual wellness visit and management of other chronic and acute problems.     Risk factors: advanced age    46 of Physicians Providing Medical Care to Patient:  See "snapshot"   Activities of Daily Living: In your present state of health, do you have any difficulty performing the following activities (lives with husband)?:  Preparing food and eating?: No  Bathing yourself: No  Getting dressed: No  Using the toilet:No  Moving around from place to place: No  In the past year have you fallen or had a near fall?: No    Home Safety: Has smoke detector and wears seat belts. No  firearms.   Opioid Use: none   Diet and Exercise  Current exercise habits: pt says fair Dietary issues discussed: pt reports a fairly healthy diet   Depression Screen  Q1: Over the past two weeks, have you felt down, depressed or hopeless? no  Q2: Over the past two weeks, have you felt little interest or pleasure in doing things? no   The following portions of the patient's history were reviewed and updated as appropriate: allergies, current medications, past family history, past medical history, past social history, past surgical history and problem list.   Review of Systems  Denies hearing loss, and visual loss Objective:   Vision:  See [facility] done today Hearing: grossly normal Body mass index:  See vs page Msk: pt easily and quickly performs "get-up-and-go" from a sitting position Cognitive Impairment Assessment: cognition, memory and judgment appear normal.  remembers 3/3 at 5 minutes.  excellent recall.  can easily read and write a sentence.  alert and oriented x 3.     Assessment:   Medicare wellness utd on preventive parameters    Plan:   During the course of the visit the patient was educated and counseled about appropriate screening and preventive services including:        Fall prevention is advised today  Screening mammography is UTD Bone densitometry screening is ordered today Diabetes screening is UTD Nutrition counseling is offered  advanced directives/end of life addressed today:  see healthcare directives hyperlink  Vaccines are updated as needed  Patient Instructions (the written plan) was given to the patient.

## 2017-10-29 ENCOUNTER — Other Ambulatory Visit: Payer: Self-pay | Admitting: Endocrinology

## 2017-10-29 ENCOUNTER — Encounter: Payer: Self-pay | Admitting: Endocrinology

## 2017-10-29 DIAGNOSIS — M48061 Spinal stenosis, lumbar region without neurogenic claudication: Secondary | ICD-10-CM | POA: Diagnosis not present

## 2017-10-29 DIAGNOSIS — M5416 Radiculopathy, lumbar region: Secondary | ICD-10-CM | POA: Diagnosis not present

## 2017-10-29 MED ORDER — CEFUROXIME AXETIL 250 MG PO TABS: 250.0000 mg | ORAL_TABLET | Freq: Two times a day (BID) | ORAL | 0 refills | Status: AC

## 2017-10-30 ENCOUNTER — Ambulatory Visit (INDEPENDENT_AMBULATORY_CARE_PROVIDER_SITE_OTHER)
Admission: RE | Admit: 2017-10-30 | Discharge: 2017-10-30 | Disposition: A | Discharge status: Home / Self Care | Payer: Medicare Other | Source: Ambulatory Visit | Attending: Endocrinology | Admitting: Endocrinology

## 2017-10-30 DIAGNOSIS — Z780 Asymptomatic menopausal state: Secondary | ICD-10-CM

## 2017-11-02 ENCOUNTER — Encounter: Payer: Self-pay | Admitting: Endocrinology

## 2017-11-02 DIAGNOSIS — J00 Acute nasopharyngitis [common cold]: Secondary | ICD-10-CM | POA: Diagnosis not present

## 2017-11-02 DIAGNOSIS — H10021 Other mucopurulent conjunctivitis, right eye: Secondary | ICD-10-CM | POA: Diagnosis not present

## 2017-11-05 DIAGNOSIS — M48061 Spinal stenosis, lumbar region without neurogenic claudication: Secondary | ICD-10-CM | POA: Diagnosis not present

## 2017-11-05 DIAGNOSIS — M5416 Radiculopathy, lumbar region: Secondary | ICD-10-CM | POA: Diagnosis not present

## 2017-11-08 DIAGNOSIS — M5416 Radiculopathy, lumbar region: Secondary | ICD-10-CM | POA: Diagnosis not present

## 2017-11-08 DIAGNOSIS — M48061 Spinal stenosis, lumbar region without neurogenic claudication: Secondary | ICD-10-CM | POA: Diagnosis not present

## 2017-11-14 ENCOUNTER — Other Ambulatory Visit: Payer: Self-pay | Admitting: Endocrinology

## 2017-11-15 ENCOUNTER — Other Ambulatory Visit: Payer: Self-pay | Admitting: Internal Medicine

## 2017-11-27 ENCOUNTER — Other Ambulatory Visit: Payer: Self-pay | Admitting: Endocrinology

## 2017-11-29 DIAGNOSIS — N8500 Endometrial hyperplasia, unspecified: Secondary | ICD-10-CM | POA: Diagnosis not present

## 2017-11-29 DIAGNOSIS — Z1231 Encounter for screening mammogram for malignant neoplasm of breast: Secondary | ICD-10-CM | POA: Diagnosis not present

## 2017-12-05 ENCOUNTER — Other Ambulatory Visit: Payer: Self-pay | Admitting: Endocrinology

## 2017-12-14 ENCOUNTER — Other Ambulatory Visit: Payer: Self-pay | Admitting: Internal Medicine

## 2017-12-16 ENCOUNTER — Other Ambulatory Visit: Payer: Self-pay | Admitting: Endocrinology

## 2018-01-01 DIAGNOSIS — H2512 Age-related nuclear cataract, left eye: Secondary | ICD-10-CM | POA: Diagnosis not present

## 2018-01-01 DIAGNOSIS — H25013 Cortical age-related cataract, bilateral: Secondary | ICD-10-CM | POA: Diagnosis not present

## 2018-01-01 DIAGNOSIS — H02831 Dermatochalasis of right upper eyelid: Secondary | ICD-10-CM | POA: Diagnosis not present

## 2018-01-01 DIAGNOSIS — H25043 Posterior subcapsular polar age-related cataract, bilateral: Secondary | ICD-10-CM | POA: Diagnosis not present

## 2018-01-01 DIAGNOSIS — H2513 Age-related nuclear cataract, bilateral: Secondary | ICD-10-CM | POA: Diagnosis not present

## 2018-01-01 DIAGNOSIS — H18413 Arcus senilis, bilateral: Secondary | ICD-10-CM | POA: Diagnosis not present

## 2018-01-08 ENCOUNTER — Other Ambulatory Visit: Payer: Self-pay | Admitting: Endocrinology

## 2018-01-12 ENCOUNTER — Other Ambulatory Visit: Payer: Self-pay | Admitting: Family Medicine

## 2018-01-14 NOTE — Telephone Encounter (Signed)
Refill done.  

## 2018-02-07 ENCOUNTER — Telehealth: Payer: Self-pay | Admitting: Endocrinology

## 2018-02-07 NOTE — Telephone Encounter (Signed)
Pt stated she is completely out of her [place] 12.5 and [facility] stated [facility] is on back order and they don't  know when they will get any back in. Pt want call back from nurse to talk about any alternatives. Please call pt

## 2018-02-08 ENCOUNTER — Other Ambulatory Visit: Payer: Self-pay | Admitting: Endocrinology

## 2018-02-08 MED ORDER — OLMESARTAN MEDOXOMIL-HCTZ 40-12.5 MG PO TABS
1.0000 | ORAL_TABLET | Freq: Every day | ORAL | 3 refills | Status: DC
Start: 2018-02-08 — End: 2019-01-28

## 2018-02-08 NOTE — Telephone Encounter (Signed)
Is there another medication that would be equivalent or just suggest pt change pharmacy? Please advise

## 2018-02-08 NOTE — Telephone Encounter (Signed)
lft vm with info

## 2018-02-08 NOTE — Telephone Encounter (Signed)
OK, I have sent a prescription to your pharmacy, to change to a similar pill.

## 2018-02-14 ENCOUNTER — Ambulatory Visit (INDEPENDENT_AMBULATORY_CARE_PROVIDER_SITE_OTHER): Payer: Medicare Other | Admitting: Family Medicine

## 2018-02-14 ENCOUNTER — Encounter: Payer: Self-pay | Admitting: Family Medicine

## 2018-02-14 ENCOUNTER — Other Ambulatory Visit: Payer: Self-pay

## 2018-02-14 VITALS — BP 120/76 | HR 75 | Temp 97.6°F | Ht 66.0 in | Wt 250.2 lb

## 2018-02-14 DIAGNOSIS — E1142 Type 2 diabetes mellitus with diabetic polyneuropathy: Secondary | ICD-10-CM | POA: Diagnosis not present

## 2018-02-14 DIAGNOSIS — M5030 Other cervical disc degeneration, unspecified cervical region: Secondary | ICD-10-CM

## 2018-02-14 DIAGNOSIS — J4520 Mild intermittent asthma, uncomplicated: Secondary | ICD-10-CM | POA: Insufficient documentation

## 2018-02-14 DIAGNOSIS — E119 Type 2 diabetes mellitus without complications: Secondary | ICD-10-CM | POA: Diagnosis not present

## 2018-02-14 DIAGNOSIS — Z23 Encounter for immunization: Secondary | ICD-10-CM

## 2018-02-14 DIAGNOSIS — E66812 Obesity, class 2: Secondary | ICD-10-CM | POA: Insufficient documentation

## 2018-02-14 DIAGNOSIS — E6609 Other obesity due to excess calories: Secondary | ICD-10-CM | POA: Insufficient documentation

## 2018-02-14 DIAGNOSIS — M8580 Other specified disorders of bone density and structure, unspecified site: Secondary | ICD-10-CM

## 2018-02-14 DIAGNOSIS — G4733 Obstructive sleep apnea (adult) (pediatric): Secondary | ICD-10-CM | POA: Diagnosis not present

## 2018-02-14 DIAGNOSIS — I10 Essential (primary) hypertension: Secondary | ICD-10-CM

## 2018-02-14 DIAGNOSIS — E7800 Pure hypercholesterolemia, unspecified: Secondary | ICD-10-CM | POA: Diagnosis not present

## 2018-02-14 DIAGNOSIS — E1140 Type 2 diabetes mellitus with diabetic neuropathy, unspecified: Secondary | ICD-10-CM | POA: Insufficient documentation

## 2018-02-14 DIAGNOSIS — K900 Celiac disease: Secondary | ICD-10-CM

## 2018-02-14 MED ORDER — ZOSTER VAC RECOMB ADJUVANTED 50 MCG/0.5ML IM SUSR: 0.5000 mL | Freq: Once | INTRAMUSCULAR | 0 refills | Status: AC

## 2018-02-14 NOTE — Patient Instructions (Signed)
Please return in April 2020 for your annual complete physical; please come fasting.  Try eating sweet potato or banana with rolled oats, almond mild and berries, nuts and flax meal with cinnamon and nutmeg in the morning. It will help your constipation and your sugars.   Call me if you'd like to have a gel injection into your right knee. You may take tylenol twice a day in the mean time.   It was a pleasure meeting you today! Thank you for choosing [place] to meet your healthcare needs! I truly look forward to working with you. If you have any questions or concerns, please send me a message via Mychart or call the office at [phone].

## 2018-02-14 NOTE — Progress Notes (Signed)
Subjective  CC:  Chief Complaint  Patient presents with  . Establish Care    Transfer Care from Dr. Loanne Drilling, still plans on seeing dr. Loanne Drilling for Diabetes, last a1c 10/25/2017, has appt on 02/27/2018  . Neck Pain    x 2 weeks, also has shoulder pain   . Hand Pain    hurt in the joints and have stiffness     HPI: Jessica [name] is a 67 y.o. female who presents to [place] [facility] today to establish care with me as a new patient.   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  Has been seeing Dr. Loanne Drilling for multiple years.  I reviewed her diabetic control and complications.  We discussed her diet.  She struggles due to celiac disease and diabetes, finding the right things to eat.  She also has chronic constipation for which she takes MiraLAX daily.  She has mild peripheral neuropathy and gabapentin.  No known vascular disease.  She reports she is had 2- stress tests.  Currently complaining of neck pain, hand pain and knee pain.  Diagnosed with osteoarthritis.  No hot red swollen joints.  No trauma.  Uses Advil as needed.  Has had Orthovisc injections in the knees before which have been helpful but that was several years ago.  No locking or give way.  She reports having had a sleep study and diagnosed with OSA but she is not on CPAP.  She is unclear why.  Need to look at old records.  Health maintenance: Current screening tests are up-to-date.  She is due for Pneumovax, flu shot and Shingrix.  She is due for annual wellness visit.  Social, she lives with her husband.  She has 2 grown children 6 grandchildren.  Her husband is disabled and she does have a caretaker to  help manage him.  She works part-time at [employer] as a Scientist, water quality in [employer].  She loves it  Assessment  1. Hypercholesterolemia   2. Osteopenia, unspecified location   3. Mild intermittent asthma without complication   4. Essential hypertension   5. Controlled type 2 diabetes mellitus without complication, without long-term current use of insulin ([place])   6. Celiac disease   7. OSA (obstructive sleep apnea)   8. Degenerative cervical disc   9. Diabetic polyneuropathy associated with type 2 diabetes mellitus ([place])   10. Need for immunization against influenza   11. Morbid obesity ([place])      Plan   Multiple medical problems with fairly good control.  Continue current medications.  Long discussion regarding nutritional options to improve both her diabetes and her constipation.  Osteoarthritis, multiple sites.  Discussed management with Tylenol and/or Advil.  Can consider Synvisc in the future if needed.  Currently joints are not inflamed.  Health maintenance: Immunizations updated today.  Shingrix vaccination sent home with patient.  Follow-up for complete physical in April.  Follow up:  Return in about 6 months (around 08/16/2018) for complete physical. Orders Placed This Encounter  Procedures  . Pneumococcal polysaccharide vaccine 23-valent greater than or equal to 2yo subcutaneous/IM  . Flu Vaccine QUAD 36+ mos IM   Meds ordered this encounter  Medications  . Zoster Vaccine Adjuvanted [facility]) injection    Sig: Inject 0.5 mLs  into the muscle once for 1 dose. Please give 2nd dose 2-6 months after first dose    Dispense:  2 each    Refill:  0     Depression screen PHQ 2/9 02/14/2018  Decreased Interest 1  Down, Depressed, Hopeless 1  PHQ - 2 Score 2  Altered sleeping 1  Tired, decreased energy 2  Change in appetite 0  Feeling bad or failure about yourself  0  Trouble concentrating 0  Moving slowly or fidgety/restless 0  Suicidal thoughts 0  PHQ-9 Score  5  Difficult doing work/chores Not difficult at all  Some recent data might be hidden    We updated and reviewed the patient's past history in detail and it is documented below.  Patient Active Problem List   Diagnosis Date Noted  . Celiac disease 02/14/2018    Priority: High  . Diabetic neuropathy associated with type 2 diabetes mellitus ([place]) 02/14/2018    Priority: High  . Morbid obesity ([place]) 02/14/2018    Priority: High  . Type 2 diabetes mellitus with complication, with long-term current use of insulin ([place]) 09/23/2010    Priority: High  . Hypercholesterolemia 01/29/2009    Priority: High    Qualifier: Diagnosis of  By: Loanne Drilling MD, Jacelyn Pi Essential hypertension 10/22/2006    Priority: High    Qualifier: Diagnosis of  By: Larose Kells     . Osteopenia 02/14/2018    Priority: Medium    dexa 2019   . Mild intermittent asthma 02/14/2018    Priority: Medium  . Degenerative arthritis of knee, bilateral 10/29/2015    Priority: Medium    Bilateral steroid injections 12/02/2015 Started Orthovisc 12/16/2015   . Degenerative cervical disc 07/27/2015    Priority: Medium  . OSA (obstructive sleep apnea) 02/28/2011    Priority: Medium  . Constipation, slow transit 09/23/2010    Priority: Medium  . FH: colon cancer 09/23/2010    Priority: Medium  . Personal history of colonic polyps 09/23/2010    Priority: Medium  . Diabetic gastroparesis ([place]) 02/27/2007    Priority: Medium    Qualifier: Diagnosis of  By: Nolon Rod CMA ([place]), Robin     . Diabetic autonomic neuropathy associated with type 2 diabetes mellitus ([place]) 10/22/2006    Priority: Medium    Qualifier: Diagnosis of  By: Marca Ancona RMA, Lucy     . Fibroma of tongue 06/06/2016    Priority: Low   Health Maintenance  Topic Date Due  . INFLUENZA VACCINE  11/29/2017  . HEMOGLOBIN A1C  04/26/2018  . OPHTHALMOLOGY EXAM  10/12/2018  . MAMMOGRAM  11/30/2018  . FOOT EXAM  02/15/2019  . COLONOSCOPY   08/25/2019  . DEXA SCAN  10/31/2019  . TETANUS/TDAP  10/26/2027  . Hepatitis C Screening  Completed  . PNA vac Low Risk Adult  Completed   Immunization History  Administered Date(s) Administered  . Influenza,inj,Quad PF,6+ Mos 02/14/2018  . Pneumococcal Conjugate-13 11/03/2015  . Pneumococcal Polysaccharide-23 08/17/2010, 02/14/2018  . Td 12/31/2002  . Tdap 10/25/2017   Current Meds  Medication Sig  . albuterol (PROVENTIL HFA;VENTOLIN HFA) 108 (90 Base) MCG/ACT inhaler Inhale into the lungs every 6 (six) hours as needed for wheezing or shortness of breath.  Marland Kitchen atorvastatin (LIPITOR) 40 MG tablet TAKE 1 TABLET BY MOUTH EVERY DAY  . BD INSULIN SYRINGE U/F 31G X 5/16" 1 ML MISC USE UP TO 3 TIMES A DAY  . DEXILANT 60 MG capsule TAKE 1 CAPSULE  DAILY  . Fluticasone-Salmeterol (ADVAIR) 100-50 MCG/DOSE AEPB Inhale 1 puff into the lungs 2 (two) times daily.  Marland Kitchen gabapentin (NEURONTIN) 400 MG capsule TAKE 1 CAPSULE (400 MG TOTAL) BY MOUTH 3 (THREE) TIMES DAILY.  . hyoscyamine (LEVSIN SL) 0.125 MG SL tablet Take 1 tablet (0.125 mg total) every 4 (four) hours as needed by mouth.  . insulin NPH-regular Human (HUMULIN 70/30) (70-30) 100 UNIT/ML injection INJECT 110 UNITS WITH BREAKFAST AND 50 UNITS WITH EVENING MEAL  . ketoconazole (NIZORAL) 2 % cream APPLY TO AFFECTED AREA EVERY DAY  . olmesartan-hydrochlorothiazide (BENICAR HCT) 40-12.5 MG tablet Take 1 tablet by mouth daily.  . ONE TOUCH ULTRA TEST test strip TEST BLOOD SUGAR 2 TIMES DAILY  . polyethylene glycol powder ([facility] PURELAX) powder DISSOLVE 1 CAPFUL IN AT LEAST 8 OUNCES WATER/JUICE AND DRINK TWICE DAILY  . ranitidine (ZANTAC) 150 MG tablet TAKE 1 TABLET AT BEDTIME  . valACYclovir (VALTREX) 500 MG tablet TAKE 1 TABLET BY MOUTH EVERY DAY    Allergies: Patient is allergic to gluten meal and nsaids. Past Medical History Patient  has a past medical history of Anxiety, Arthritis, Asthma, Autonomic neuropathy, Cataract, Celiac disease,  Depression, Diabetes mellitus, Diabetic neuropathy ([place]), Diverticulosis, Fatty liver, Gastroparesis, GERD (gastroesophageal reflux disease), Hyperlipidemia, Hypertension, IBS (irritable bowel syndrome), Personal history of colonic polyps (03/2010), and Pneumonia (11/03/2015). Past Surgical History Patient  has a past surgical history that includes Tubal ligation; Appendectomy; left breast cyst removal (2000); Dilatation & currettage/hysteroscopy with resectoscope (N/A, 03/24/2013); Colonoscopy; and Hysteroscopy w/D&C (N/A, 11/08/2015). Family History: Patient family history includes Colon cancer in her sister; Diabetes in her brother and sister; Heart disease in her father; Other in her father. Social History:  Patient  reports that she has never smoked. She has never used smokeless tobacco. She reports that she does not drink alcohol or use drugs.  Review of Systems: Constitutional: negative for fever or malaise Ophthalmic: negative for photophobia, double vision or loss of vision Cardiovascular: negative for chest pain, dyspnea on exertion, or new LE swelling Respiratory: negative for SOB or persistent cough Gastrointestinal: negative for abdominal pain, change in bowel habits or melena Genitourinary: negative for dysuria or gross hematuria Musculoskeletal: negative for new gait disturbance or muscular weakness Integumentary: negative for new or persistent rashes Neurological: negative for TIA or stroke symptoms Psychiatric: negative for SI or delusions Allergic/Immunologic: negative for hives  Patient Care Team    Relationship Specialty Notifications Start End  [name], MD PCP - General Family Medicine  02/14/18   Olga Millers, MD Attending Physician Obstetrics and Gynecology  04/04/12   Sable Feil, MD Attending Physician Gastroenterology  04/04/12   Renato Shin, MD Consulting Physician Endocrinology  02/14/18   Olga Millers, MD Consulting Physician Obstetrics  and Gynecology  02/14/18 02/14/18    Objective  Vitals: BP 120/76   Pulse 75   Temp 97.6 F (36.4 C)   Ht 5' 6"  (1.676 m)   Wt 250 lb 3.2 oz (113.5 kg)   BMI 40.38 kg/m  General:  Well developed, well nourished, no acute distress  Psych:  Alert and oriented,normal mood and affect HEENT:  Normocephalic, atraumatic, non-icteric sclera, PERRL, oropharynx is without mass or exudate, supple neck without adenopathy, mass or thyromegaly Cardiovascular:  RRR without gallop, rub or murmur, nondisplaced PMI Respiratory:  Good breath sounds bilaterally, CTAB with normal respiratory effort Gastrointestinal: normal bowel sounds, soft, non-tender, no noted masses. No HSM MSK: no deformities, contusions. Joints are without erythema  or swelling, crepitus in bilateral knees, no swollen joints in the hand Skin:  Warm, no rashes or suspicious lesions noted Neurologic:    Mental status is normal. Gross motor and sensory exams are normal. Normal gait Diabetic Foot Exam: Appearance - no lesions, ulcers or calluses Skin - no sigificant pallor or erythema Monofilament testing - sensitive bilaterally in following locations:  Right - Great toe, medial, central, lateral ball and posterior foot intact  Left - Great toe, medial, central, lateral ball and posterior foot intact Pulses - +2 distally bilaterally    Commons side effects, risks, benefits, and alternatives for medications and treatment plan prescribed today were discussed, and the patient expressed understanding of the given instructions. Patient is instructed to call or message via MyChart if he/she has any questions or concerns regarding our treatment plan. No barriers to understanding were identified. We discussed Red Flag symptoms and signs in detail. Patient expressed understanding regarding what to do in case of urgent or emergency type symptoms.   Medication list was reconciled, printed and provided to the patient in AVS. Patient instructions  and summary information was reviewed with the patient as documented in the AVS. This note was prepared with assistance of Dragon voice recognition software. Occasional wrong-word or sound-a-like substitutions may have occurred due to the inherent limitations of voice recognition software

## 2018-02-17 ENCOUNTER — Other Ambulatory Visit: Payer: Self-pay | Admitting: Endocrinology

## 2018-02-25 ENCOUNTER — Telehealth: Payer: Self-pay | Admitting: Family Medicine

## 2018-02-25 NOTE — Telephone Encounter (Signed)
Copied from [place] [id number]. Topic: General - Other >> Feb 25, 2018  4:51 PM Cecelia Byars, NT wrote: Reason for CRM: Patient called and said she has changed her mind about getting a shot in her right knee and left knee, and would like to know how to proceed . She also would like to know of Dr Jonni Sanger can monitor her diabetes or does she still need to see Dr Loanne Drilling please advise [phone] ok to leave a message

## 2018-02-26 NOTE — Telephone Encounter (Signed)
Patient is interested in getting Synvisc knee injections now. Ok to Order? Also, she wants to know if Dr. Jonni Sanger can monitor her diabetes, opposing to going back to Dr. Loanne Drilling. Please advise.   Doloris Hall,  LPN

## 2018-02-26 NOTE — Telephone Encounter (Signed)
I recommend continuing with Dr. Loanne Drilling for diabetes management due to the high insulin requirement.   We can set her up for synvisc. Please get set up - we would do one knee at a time.  Thanks.

## 2018-02-27 ENCOUNTER — Ambulatory Visit: Payer: Medicare Other | Admitting: Endocrinology

## 2018-02-27 DIAGNOSIS — Z0289 Encounter for other administrative examinations: Secondary | ICD-10-CM

## 2018-02-27 NOTE — Telephone Encounter (Signed)
Spoke with Patient will call and verify benefits with insurance   Amy Brinckerhoff,  LPN

## 2018-02-27 NOTE — Telephone Encounter (Signed)
LM to RC  

## 2018-02-28 NOTE — Telephone Encounter (Signed)
LM to RC  

## 2018-02-28 NOTE — Telephone Encounter (Signed)
Appointment scheduled for 03/07/2018

## 2018-03-01 HISTORY — PX: CATARACT EXTRACTION: SUR2

## 2018-03-04 DIAGNOSIS — N8500 Endometrial hyperplasia, unspecified: Secondary | ICD-10-CM | POA: Diagnosis not present

## 2018-03-07 ENCOUNTER — Ambulatory Visit (INDEPENDENT_AMBULATORY_CARE_PROVIDER_SITE_OTHER): Payer: Medicare Other | Admitting: Family Medicine

## 2018-03-07 ENCOUNTER — Other Ambulatory Visit: Payer: Self-pay

## 2018-03-07 ENCOUNTER — Encounter: Payer: Self-pay | Admitting: Family Medicine

## 2018-03-07 VITALS — BP 122/84 | HR 83 | Temp 98.1°F | Ht 66.0 in | Wt 252.4 lb

## 2018-03-07 DIAGNOSIS — M170 Bilateral primary osteoarthritis of knee: Secondary | ICD-10-CM | POA: Diagnosis not present

## 2018-03-07 MED ORDER — KETOCONAZOLE 2 % EX CREA: TOPICAL_CREAM | CUTANEOUS | 2 refills | Status: DC

## 2018-03-07 NOTE — Progress Notes (Signed)
Synvisc Knee Injection  LOT # [id number] Exp: 10/28/2020  Product Number: [id number]

## 2018-03-07 NOTE — Progress Notes (Addendum)
Subjective  CC:  Chief Complaint  Patient presents with  . Knee Pain    worsening knee pain, ? injection     HPI: Jessica [name] is a 67 y.o. female who presents to the office today to address the problems listed above in the chief complaint.  She has OA of the  Bilateral knee(s) and is having pain that impedes her quality of life and activity level. I reviewed old records from [facility] in 2015; had xrays at that time showing moderate OA. Had viscosupplementation at that time and reports significant benefit. Over the last month, knee pain has worsened. Using advil, tylenol with mild relief.  No contraindications to steroids or Synvisc are identified. We have used alternative measures for pain control including tylenol, nsaids, ice, rest, and other prescribed analgesics with variable success. No recent trauma.   I reviewed the patients updated PMH, FH, and SocHx.    Patient Active Problem List   Diagnosis Date Noted  . Celiac disease 02/14/2018    Priority: High  . Diabetic neuropathy associated with type 2 diabetes mellitus ([place]) 02/14/2018    Priority: High  . Morbid obesity ([place]) 02/14/2018    Priority: High  . Type 2 diabetes mellitus with complication, with long-term current use of insulin ([place]) 09/23/2010    Priority: High  . Hypercholesterolemia 01/29/2009    Priority: High  . Essential hypertension 10/22/2006    Priority: High  . Osteopenia 02/14/2018    Priority: Medium  . Mild intermittent asthma 02/14/2018    Priority: Medium  . Degenerative arthritis of knee, bilateral 10/29/2015    Priority: Medium  . Degenerative cervical disc 07/27/2015    Priority: Medium  . OSA (obstructive sleep apnea) 02/28/2011    Priority: Medium  . Constipation, slow transit 09/23/2010    Priority: Medium  . FH: colon cancer 09/23/2010    Priority: Medium  . Personal history of colonic polyps 09/23/2010    Priority: Medium  . Diabetic gastroparesis ([place]) 02/27/2007    Priority: Medium    . Diabetic autonomic neuropathy associated with type 2 diabetes mellitus ([place]) 10/22/2006    Priority: Medium  . Fibroma of tongue 06/06/2016    Priority: Low   Current Meds  Medication Sig  . albuterol (PROVENTIL HFA;VENTOLIN HFA) 108 (90 Base) MCG/ACT inhaler Inhale into the lungs every 6 (six) hours as needed for wheezing or shortness of breath.  Marland Kitchen atorvastatin (LIPITOR) 40 MG tablet TAKE 1 TABLET BY MOUTH EVERY DAY  . BD INSULIN SYRINGE U/F 31G X 5/16" 1 ML MISC USE UP TO 3 TIMES A DAY  . BD INSULIN SYRINGE U/F 31G X 5/16" 1 ML MISC USE UP TO 3 TIMES A DAY  . DEXILANT 60 MG capsule TAKE 1 CAPSULE DAILY  . Fluticasone-Salmeterol (ADVAIR) 100-50 MCG/DOSE AEPB Inhale 1 puff into the lungs 2 (two) times daily.  Marland Kitchen gabapentin (NEURONTIN) 400 MG capsule TAKE 1 CAPSULE (400 MG TOTAL) BY MOUTH 3 (THREE) TIMES DAILY.  . hyoscyamine (LEVSIN SL) 0.125 MG SL tablet Take 1 tablet (0.125 mg total) every 4 (four) hours as needed by mouth.  . Ibuprofen (ADVIL) 200 MG CAPS Advil  . insulin NPH-regular Human (HUMULIN 70/30) (70-30) 100 UNIT/ML injection INJECT 110 UNITS WITH BREAKFAST AND 50 UNITS WITH EVENING MEAL  . ketoconazole (NIZORAL) 2 % cream Apply twice a day for 2 weeks as needed for rash  . olmesartan-hydrochlorothiazide (BENICAR HCT) 40-12.5 MG tablet Take 1 tablet by mouth daily.  . ONE TOUCH  ULTRA TEST test strip TEST BLOOD SUGAR 2 TIMES DAILY  . polyethylene glycol powder ([facility] PURELAX) powder DISSOLVE 1 CAPFUL IN AT LEAST 8 OUNCES WATER/JUICE AND DRINK TWICE DAILY  . ranitidine (ZANTAC) 150 MG tablet TAKE 1 TABLET AT BEDTIME  . valACYclovir (VALTREX) 500 MG tablet TAKE 1 TABLET BY MOUTH EVERY DAY  . [DISCONTINUED] ketoconazole (NIZORAL) 2 % cream APPLY TO AFFECTED AREA EVERY DAY    Allergies: Patient is allergic to gluten meal and nsaids.  Review of Systems: Constitutional: Negative for fever malaise or anorexia Cardiovascular: negative for chest pain Respiratory: negative for SOB  or persistent cough Gastrointestinal: negative for abdominal pain  Objective  Vitals: BP 122/84   Pulse 83   Temp 98.1 F (36.7 C)   Ht 5' 6"  (1.676 m)   Wt 252 lb 6.4 oz (114.5 kg)   SpO2 99%   BMI 40.74 kg/m  General: no acute distress , A&Ox3 Cardiovascular:  RRR without murmur  Knee:  Right without redness, warmth or effusion, + crepitus Skin:  Warm, no rashes  Procedure note for Knee Joint Aspiration and/or Injection:  Indications: pain relief, failed conservative therapies  Procedure Note for Injection of Synvisc 75m/injection (870mml) Dx: osteoarthritis of knee(s) Indication: pain relief .Right Knee(s) injection(s) was/were performed as follows:  Initial universal "time out" was performed  Verbal consent was obtained regarding procedure, indications, risks and benefits   With the patient sitting on edge of bed, the knee(s) was/were cleansed with betadine and alcohol, anesthesia with ethyl chloride spray was done and using the lateral approach, Synvisc-one 4870mas injected to the knee(s). The flow was easy into the large joint space and the patient tolerated the procedure well. There were no complications. No bleeding. The patient left the exam room in good condition.  Complications:  None; patient tolerated the procedure well.   Assessment  1. Primary osteoarthritis of both knees      Plan   S/p intra-articular knee joint aspiration and steroid injection:  Routine post procedure care discussed. Rest, Ice, Nsaids as needed and ROM exercises recommended. F/u care printed out for patient in AVS.   Discussed urgent f/u if develops increased pain, redness or fever.   Follow up: Return if symptoms worsen or fail to improve.    Commons side effects, risks, benefits, and alternatives for medications and treatment plan prescribed today were discussed, and the patient expressed understanding of the given instructions. Patient is instructed to call or message via MyChart  if he/she has any questions or concerns regarding our treatment plan. No barriers to understanding were identified. We discussed Red Flag symptoms and signs in detail. Patient expressed understanding regarding what to do in case of urgent or emergency type symptoms.   Medication list was reconciled, printed and provided to the patient in AVS. Patient instructions and summary information was reviewed with the patient as documented in the AVS. This note was prepared with assistance of Dragon voice recognition software. Occasional wrong-word or sound-a-like substitutions may have occurred due to the inherent limitations of voice recognition software  No orders of the defined types were placed in this encounter.  Meds ordered this encounter  Medications  . ketoconazole (NIZORAL) 2 % cream    Sig: Apply twice a day for 2 weeks as needed for rash    Dispense:  60 g    Refill:  2

## 2018-03-07 NOTE — Patient Instructions (Signed)
Send me a message if you'd like to have Synvisc-one injection on the left knee.  You may try taking glucosamine 2-3x/day. It is an otc medication.  You had a Synvisc injection today.  Things to be aware of after this injection are listed below:  You may experience no significant improvement or even a slight worsening in your symptoms during the first 24 to 48 hours.  After that we expect your symptoms to improve gradually over the next 2 weeks for the medicine to have its maximal effect.  You should continue to have improvement out to 6 weeks after your injection.  I recommend icing the site of the injection for 20 minutes  1-2 times the day of your injection  You may shower but no swimming, tub bath or Jacuzzi for 24 hours.  If your bandage falls off this does not need to be replaced.  It is appropriate to remove the bandage after 4 hours.  You may resume light activities as tolerated unless otherwise directed per Dr. Jonni Sanger during your visit     POSSIBLE PROCEDURE SIDE EFFECTS: The side effects of the injection are usually fairly minimal however if you may experience some of the following side effects that are usually self-limited and will is off on their own.  If you are concerned please feel free to call the office with questions:             Increased numbness or tingling             Nausea or vomiting             Swelling or bruising at the injection site    Please call our office if if you experience any of the following symptoms over the next week as these can be signs of infection:              Fever greater than 100.65F             Significant swelling at the injection site             Significant redness or drainage from the injection site

## 2018-03-11 ENCOUNTER — Other Ambulatory Visit: Payer: Self-pay | Admitting: Endocrinology

## 2018-03-13 ENCOUNTER — Encounter: Payer: Self-pay | Admitting: Endocrinology

## 2018-03-13 ENCOUNTER — Ambulatory Visit (INDEPENDENT_AMBULATORY_CARE_PROVIDER_SITE_OTHER): Payer: Medicare Other | Admitting: Endocrinology

## 2018-03-13 VITALS — BP 132/76 | HR 70 | Ht 66.0 in | Wt 253.4 lb

## 2018-03-13 DIAGNOSIS — E1143 Type 2 diabetes mellitus with diabetic autonomic (poly)neuropathy: Secondary | ICD-10-CM | POA: Diagnosis not present

## 2018-03-13 DIAGNOSIS — Z794 Long term (current) use of insulin: Secondary | ICD-10-CM | POA: Diagnosis not present

## 2018-03-13 LAB — POCT GLYCOSYLATED HEMOGLOBIN (HGB A1C): Hemoglobin A1C: 6.5 % — AB (ref 4.0–5.6)

## 2018-03-13 MED ORDER — INSULIN NPH ISOPHANE & REGULAR (70-30) 100 UNIT/ML ~~LOC~~ SUSP
SUBCUTANEOUS | 5 refills | Status: DC
Start: 2018-03-13 — End: 2018-07-17

## 2018-03-13 MED ORDER — GLUCOSE BLOOD VI STRP: 1.0000 | ORAL_STRIP | Freq: Two times a day (BID) | 3 refills | Status: DC

## 2018-03-13 NOTE — Progress Notes (Signed)
Subjective:    Patient ID: Jessica [name], female    DOB: 1951-03-04, 67 y.o.   MRN: [id number]  HPI Pt returns for f/u of diabetes mellitus:  DM type: Insulin-requiring type 2. Dx'ed: 2000.  Complications: polyneuropathy and autonomic neuropathy.   Therapy: insulin since 2005 GDM: never DKA: never Severe hypoglycemia: never.  Pancreatitis: never.  Other: her ability to care for her DM is compromised by her husband's illness; she has done better on a BID insulin regimen; she takes human insulin, due to cost.   Interval history: She takes 110 units qam and 50 qpm.  no cbg record, but states cbg's vary from 65-200.  There is no trend throughout the day.   Past Medical History:  Diagnosis Date  . Anxiety   . Arthritis    bilateral knees  . Asthma    childhood  . Autonomic neuropathy    diabetic  . Cataract   . Celiac disease   . Depression   . Diabetes mellitus    type II, Hemoglobin A1C 9.9 10/05/2011  . Diabetic neuropathy ([place])   . Diverticulosis   . Fatty liver   . Gastroparesis   . GERD (gastroesophageal reflux disease)   . Hyperlipidemia   . Hypertension   . IBS (irritable bowel syndrome)   . Personal history of colonic polyps 03/2010   hyperplastic  . Pneumonia 11/03/2015   current    Past Surgical History:  Procedure Laterality Date  . APPENDECTOMY    . COLONOSCOPY    . DILATATION & CURRETTAGE/HYSTEROSCOPY WITH RESECTOCOPE N/A 03/24/2013   Procedure: DILATATION & CURETTAGE, HYSTEROSCOPY WITH RESECTION;  Surgeon: Olga Millers, MD;  Location: [facility];  Service: Gynecology;  Laterality: N/A;  . HYSTEROSCOPY W/D&C N/A 11/08/2015   Procedure: DILATATION AND CURETTAGE /HYSTEROSCOPY;  Surgeon: Olga Millers, MD;  Location: [facility];  Service: Gynecology;  Laterality: N/A;  . left breast cyst removal  2000  . TUBAL LIGATION      Social History   Socioeconomic History  . Marital status: Married    Spouse name: Devar Klabunde  . Number of children: 2  . Years  of education: Not on file  . Highest education level: Not on file  Occupational History  . 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 Financial resource strain: Not on file  . Food insecurity:    Worry: Not on file    Inability: Not on file  . Transportation needs:    Medical: Not on file    Non-medical: Not on file  Tobacco Use  . Smoking status: Never Smoker  . Smokeless tobacco: Never Used  Substance and Sexual Activity  . Alcohol use: No  . Drug use: No  . Sexual activity: Yes    Birth control/protection: Post-menopausal, Surgical  Lifestyle  . Physical activity:    Days per week: Not on file    Minutes per session: Not on file  . Stress: Not on file  Relationships  . Social connections:    Talks on phone: Not on file    Gets together: Not on file    Attends religious service: Not on file    Active member of club or organization: Not on file    Attends meetings of clubs or organizations: Not on file    Relationship status: Not on file  . Intimate partner violence:    Fear of current or ex partner:  Not on file    Emotionally abused: Not on file    Physically abused: Not on file    Forced sexual activity: Not on file  Other Topics Concern  . Not on file  Social History Narrative  . Not on file    Current Outpatient Medications on File Prior to Visit  Medication Sig Dispense Refill  . atorvastatin (LIPITOR) 40 MG tablet TAKE 1 TABLET BY MOUTH EVERY DAY 30 tablet 0  . BESIVANCE 0.6 % SUSP     . DEXILANT 60 MG capsule TAKE 1 CAPSULE DAILY 90 capsule 1  . DUREZOL 0.05 % EMUL     . Fluticasone-Salmeterol (ADVAIR) 100-50 MCG/DOSE AEPB Inhale 1 puff into the lungs 2 (two) times daily. 1 each 3  . gabapentin (NEURONTIN) 400 MG capsule TAKE 1 CAPSULE (400 MG TOTAL) BY MOUTH 3 (THREE) TIMES DAILY. 270 capsule 1  . hyoscyamine (LEVSIN SL) 0.125 MG SL tablet Take 1 tablet (0.125 mg total) every 4 (four) hours as  needed by mouth. 30 tablet 3  . Ibuprofen (ADVIL) 200 MG CAPS Advil    . ketoconazole (NIZORAL) 2 % cream Apply twice a day for 2 weeks as needed for rash 60 g 2  . olmesartan-hydrochlorothiazide (BENICAR HCT) 40-12.5 MG tablet Take 1 tablet by mouth daily. 90 tablet 3  . polyethylene glycol powder ([facility] PURELAX) powder DISSOLVE 1 CAPFUL IN AT LEAST 8 OUNCES WATER/JUICE AND DRINK TWICE DAILY 1020 g 2  . ranitidine (ZANTAC) 150 MG tablet TAKE 1 TABLET AT BEDTIME 90 tablet 2  . valACYclovir (VALTREX) 500 MG tablet TAKE 1 TABLET BY MOUTH EVERY DAY 60 tablet 3  . albuterol (PROVENTIL HFA;VENTOLIN HFA) 108 (90 Base) MCG/ACT inhaler Inhale into the lungs every 6 (six) hours as needed for wheezing or shortness of breath.     No current facility-administered medications on file prior to visit.     Allergies  Allergen Reactions  . Gluten Meal Other (See Comments)    No wheat or soy  . Nsaids Nausea And Vomiting    Cannot tolerate large doses     Family History  Problem Relation Age of Onset  . Hypertension Mother   . Colon cancer Sister   . Diabetes Sister   . Diabetes Sister   . Hypertension Brother   . Heart disease Father   . Other Father        2 collapsed lungs  . COPD Father   . Diabetes Father   . Heart attack Father     BP 132/76 (BP Location: Right Arm, Patient Position: Sitting, Cuff Size: Normal)   Pulse 70   Ht 5' 6"  (1.676 m)   Wt 253 lb 6.4 oz (114.9 kg)   SpO2 96%   BMI 40.90 kg/m    Review of Systems Denies LOC    Objective:   Physical Exam VITAL SIGNS:  See vs page GENERAL: no distress Pulses: foot pulses are intact bilaterally.   MSK: no deformity of the feet or ankles.  CV: no edema of the legs or ankles Skin:  no ulcer on the feet or ankles.  normal color and temp on the feet and ankles Neuro: sensation is intact to touch on the feet and ankles, but decreased from normal.    Lab Results  Component Value Date   HGBA1C 6.5 (A) 03/13/2018         Assessment & Plan:  Insulin-requiring type 2 DM, with AN: overcontrolled.   Patient Instructions  Please  reduce the insulin to 105 units with breakfast, and 45 units with supper.   check your blood sugar twice a day.  vary the time of day when you check, between before the 3 meals, and at bedtime.  also check if you have symptoms of your blood sugar being too high or too low.  please keep a record of the readings and bring it to your next appointment here (or you can bring the meter itself).  You can write it on any piece of paper.  please call us sooner if your blood sugar goes below 70, or if you have a lot of readings over 200.  Please come back for a follow-up appointment in 3-4 months.

## 2018-03-13 NOTE — Patient Instructions (Addendum)
Please reduce the insulin to 105 units with breakfast, and 45 units with supper.   check your blood sugar twice a day.  vary the time of day when you check, between before the 3 meals, and at bedtime.  also check if you have symptoms of your blood sugar being too high or too low.  please keep a record of the readings and bring it to your next appointment here (or you can bring the meter itself).  You can write it on any piece of paper.  please call us sooner if your blood sugar goes below 70, or if you have a lot of readings over 200.  Please come back for a follow-up appointment in 3-4 months.

## 2018-03-21 ENCOUNTER — Other Ambulatory Visit: Payer: Self-pay | Admitting: Endocrinology

## 2018-03-22 ENCOUNTER — Other Ambulatory Visit: Payer: Self-pay | Admitting: Internal Medicine

## 2018-03-25 DIAGNOSIS — H2512 Age-related nuclear cataract, left eye: Secondary | ICD-10-CM | POA: Diagnosis not present

## 2018-03-25 DIAGNOSIS — H2513 Age-related nuclear cataract, bilateral: Secondary | ICD-10-CM | POA: Diagnosis not present

## 2018-03-26 DIAGNOSIS — H2511 Age-related nuclear cataract, right eye: Secondary | ICD-10-CM | POA: Diagnosis not present

## 2018-04-04 ENCOUNTER — Other Ambulatory Visit: Payer: Self-pay | Admitting: Emergency Medicine

## 2018-04-04 ENCOUNTER — Other Ambulatory Visit: Payer: Self-pay | Admitting: Endocrinology

## 2018-04-04 MED ORDER — ATORVASTATIN CALCIUM 40 MG PO TABS: 40.0000 mg | ORAL_TABLET | Freq: Every day | ORAL | 0 refills | Status: DC

## 2018-04-05 ENCOUNTER — Other Ambulatory Visit: Payer: Self-pay | Admitting: Emergency Medicine

## 2018-04-05 MED ORDER — ATORVASTATIN CALCIUM 40 MG PO TABS: 40.0000 mg | ORAL_TABLET | Freq: Every day | ORAL | 1 refills | Status: DC

## 2018-04-22 ENCOUNTER — Telehealth: Payer: Self-pay | Admitting: *Deleted

## 2018-04-22 ENCOUNTER — Encounter: Payer: Self-pay | Admitting: Family Medicine

## 2018-04-22 NOTE — Telephone Encounter (Signed)
Per Dr. Jonni Sanger, order and get patient for left knee Synvisc injection, request sent to physician services.

## 2018-04-27 ENCOUNTER — Other Ambulatory Visit: Payer: Self-pay | Admitting: Endocrinology

## 2018-04-27 NOTE — Telephone Encounter (Signed)
Please refill x 3 months Further refills would have to be considered by new PCP   

## 2018-04-29 DIAGNOSIS — H2513 Age-related nuclear cataract, bilateral: Secondary | ICD-10-CM | POA: Diagnosis not present

## 2018-04-29 DIAGNOSIS — H2511 Age-related nuclear cataract, right eye: Secondary | ICD-10-CM | POA: Diagnosis not present

## 2018-04-29 HISTORY — PX: CATARACT EXTRACTION: SUR2

## 2018-04-30 ENCOUNTER — Encounter: Payer: Self-pay | Admitting: Family Medicine

## 2018-04-30 ENCOUNTER — Other Ambulatory Visit: Payer: Self-pay | Admitting: *Deleted

## 2018-04-30 ENCOUNTER — Ambulatory Visit (INDEPENDENT_AMBULATORY_CARE_PROVIDER_SITE_OTHER): Payer: Medicare Other | Admitting: Family Medicine

## 2018-04-30 ENCOUNTER — Other Ambulatory Visit: Payer: Self-pay

## 2018-04-30 VITALS — BP 130/70 | HR 70 | Temp 98.9°F | Resp 16 | Ht 66.0 in | Wt 251.4 lb

## 2018-04-30 DIAGNOSIS — M170 Bilateral primary osteoarthritis of knee: Secondary | ICD-10-CM | POA: Diagnosis not present

## 2018-04-30 MED ORDER — FLUTICASONE-SALMETEROL 100-50 MCG/DOSE IN AEPB: INHALATION_SPRAY | RESPIRATORY_TRACT | 1 refills | Status: DC

## 2018-04-30 NOTE — Patient Instructions (Signed)
Please follow up if symptoms do not improve or as needed.   You had a synvisc-one injection today into your left knee.  Ice your knee 2-3 x / day for 1-2 days.  Be prepared for some increase in stiffness or pain initially; in 72 hours or so, you should start feeling relief.   Call or return for redness or severe pain or fever.

## 2018-04-30 NOTE — Progress Notes (Signed)
Chief Complaint  Patient presents with  . Knee Pain    Left knee pain. Resting pain level 5/10 and 9-10/10 when active.. She takes Advil as needed for pain   Today's Vitals   04/30/18 1100  BP: 130/70  Pulse: 70  Resp: 16  Temp: 98.9 F (37.2 C)  TempSrc: Oral  SpO2: 98%  Weight: 251 lb 6.4 oz (114 kg)  Height: 5' 6"  (1.676 m)   Body mass index is 40.58 kg/m.  Here for synvisc injection for OA left knee; did well with right knee synvisc. No fevers or rash.   Procedure Note for Injection of Synvisc 41m/injection (852mml)(6ml total) Dx: osteoarthritis of knee(s) Indication: pain relief .Left Knee(s) injection(s) was/were performed as follows:  Initial universal "time out" was performed  Verbal consent was obtained regarding procedure, indications, risks and benefits   With the patient sitting on edge of bed, the knee(s) was/were cleansed with betadine and alcohol, anesthesia with ethyl chloride spray was done and using the lateral approach, Synvisc-one 4837mas injected to the knee(s). The flow was easy into the large joint space and the patient tolerated the procedure well. There were no complications. No bleeding. The patient left the exam room in good condition.

## 2018-04-30 NOTE — Progress Notes (Signed)
Patient ID: Jessica [name], female   DOB: [birth year], 67 y.o.   MRN: [id number]  Synvisc Knee Injection Left knee  LOT # [id number] Exp: 08/28/2020  Product Number: [id number]

## 2018-05-19 ENCOUNTER — Other Ambulatory Visit: Payer: Self-pay | Admitting: Internal Medicine

## 2018-05-21 ENCOUNTER — Ambulatory Visit (INDEPENDENT_AMBULATORY_CARE_PROVIDER_SITE_OTHER): Payer: Medicare Other | Admitting: Podiatry

## 2018-05-21 ENCOUNTER — Encounter: Payer: Self-pay | Admitting: Podiatry

## 2018-05-21 ENCOUNTER — Telehealth: Payer: Self-pay | Admitting: Podiatry

## 2018-05-21 ENCOUNTER — Ambulatory Visit (INDEPENDENT_AMBULATORY_CARE_PROVIDER_SITE_OTHER): Payer: Medicare Other

## 2018-05-21 VITALS — BP 120/59 | HR 71 | Resp 16

## 2018-05-21 DIAGNOSIS — E1142 Type 2 diabetes mellitus with diabetic polyneuropathy: Secondary | ICD-10-CM | POA: Diagnosis not present

## 2018-05-21 MED ORDER — GABAPENTIN 600 MG PO TABS: ORAL_TABLET | ORAL | 3 refills | Status: DC

## 2018-05-21 NOTE — Progress Notes (Signed)
Subjective:  Patient ID: Jessica [name], female    DOB: 09/14/1950,  MRN: [id number] HPI Chief Complaint  Patient presents with  . Foot Pain    Between toes bilateral - burning x couple months, worse at night, diabetic - last a1c was 6.5, does have neuropathy (usually just numbness), no treatment  . Debridement    Toenails - trouble trimming toenails and concerned about thickness as well  . New Patient (Initial Visit)    Est pt 2016    68 y.o. female presents with the above complaint.   ROS: Denies fever chills nausea vomiting muscle aches pains calf pain back pain chest pain shortness of breath.  Past Medical History:  Diagnosis Date  . Anxiety   . Arthritis    bilateral knees  . Asthma    childhood  . Autonomic neuropathy    diabetic  . Cataract   . Celiac disease   . Depression   . Diabetes mellitus    type II, Hemoglobin A1C 9.9 10/05/2011  . Diabetic neuropathy ([place])   . Diverticulosis   . Fatty liver   . Gastroparesis   . GERD (gastroesophageal reflux disease)   . Hyperlipidemia   . Hypertension   . IBS (irritable bowel syndrome)   . Personal history of colonic polyps 03/2010   hyperplastic  . Pneumonia 11/03/2015   current   Past Surgical History:  Procedure Laterality Date  . APPENDECTOMY    . CATARACT EXTRACTION Right 04/29/2018  . COLONOSCOPY    . DILATATION & CURRETTAGE/HYSTEROSCOPY WITH RESECTOCOPE N/A 03/24/2013   Procedure: DILATATION & CURETTAGE, HYSTEROSCOPY WITH RESECTION;  Surgeon: Olga Millers, MD;  Location: [facility];  Service: Gynecology;  Laterality: N/A;  . HYSTEROSCOPY W/D&C N/A 11/08/2015   Procedure: DILATATION AND CURETTAGE /HYSTEROSCOPY;  Surgeon: Olga Millers, MD;  Location: [facility];  Service: Gynecology;  Laterality: N/A;  . left breast cyst removal  2000  . TUBAL LIGATION      Current Outpatient Medications:  .  albuterol (PROVENTIL HFA;VENTOLIN HFA) 108 (90 Base) MCG/ACT inhaler, Inhale into the lungs every 6 (six) hours as  needed for wheezing or shortness of breath., Disp: , Rfl:  .  atorvastatin (LIPITOR) 40 MG tablet, Take 1 tablet (40 mg total) by mouth daily., Disp: 90 tablet, Rfl: 1 .  BD INSULIN SYRINGE U/F 31G X 5/16" 1 ML MISC, USE UP TO 3 TIMES A DAY, Disp: , Rfl:  .  BESIVANCE 0.6 % SUSP, , Disp: , Rfl:  .  DEXILANT 60 MG capsule, TAKE 1 CAPSULE DAILY, Disp: 30 capsule, Rfl: 2 .  DUREZOL 0.05 % EMUL, , Disp: , Rfl:  .  Fluticasone-Salmeterol (ADVAIR DISKUS) 100-50 MCG/DOSE AEPB, INHALE 1 PUFF INTO THE LUNGS 2 (TWO) TIMES DAILY., Disp: 14 each, Rfl: 1 .  gabapentin (NEURONTIN) 600 MG tablet, Take one AM, take two qhs, Disp: 90 tablet, Rfl: 3 .  glucose blood (ONE TOUCH ULTRA TEST) test strip, 1 each by Other route 2 (two) times daily. And lancets 2/day, Disp: 200 each, Rfl: 3 .  hyoscyamine (LEVSIN SL) 0.125 MG SL tablet, Take 1 tablet (0.125 mg total) every 4 (four) hours as needed by mouth., Disp: 30 tablet, Rfl: 3 .  Ibuprofen (ADVIL) 200 MG CAPS, Advil, Disp: , Rfl:  .  insulin NPH-regular Human (HUMULIN 70/30) (70-30) 100 UNIT/ML injection, 105 UNITS WITH BREAKFAST AND 45 UNITS WITH EVENING MEAL, Disp: 60 mL, Rfl: 5 .  ketoconazole (NIZORAL) 2 % cream, Apply twice a  day for 2 weeks as needed for rash, Disp: 60 g, Rfl: 2 .  olmesartan-hydrochlorothiazide (BENICAR HCT) 40-12.5 MG tablet, Take 1 tablet by mouth daily., Disp: 90 tablet, Rfl: 3 .  polyethylene glycol powder ([facility] PURELAX) powder, DISSOLVE 1 CAPFUL IN AT LEAST 8 OUNCES WATER/JUICE AND DRINK TWICE DAILY, Disp: 1020 g, Rfl: 2 .  ranitidine (ZANTAC) 150 MG tablet, TAKE 1 TABLET AT BEDTIME, Disp: 90 tablet, Rfl: 2 .  valACYclovir (VALTREX) 500 MG tablet, TAKE 1 TABLET BY MOUTH EVERY DAY, Disp: 60 tablet, Rfl: 3  Allergies  Allergen Reactions  . Gluten Meal Other (See Comments)    No wheat or soy  . Nsaids Nausea And Vomiting    Cannot tolerate large doses    Review of Systems Objective:   Vitals:   05/21/18 1525  BP: (!) 120/59    Pulse: 71  Resp: 16    General: Well developed, nourished, in no acute distress, alert and oriented x3   Dermatological: Skin is warm, dry and supple bilateral. Nails x 10 are well maintained; remaining integument appears unremarkable at this time. There are no open sores, no preulcerative lesions, no rash or signs of infection present.  Vascular: Dorsalis Pedis artery and Posterior Tibial artery pedal pulses are 2/4 bilateral with immedate capillary fill time. Pedal hair growth present. No varicosities and no lower extremity edema present bilateral.   Neruologic: Grossly intact via light touch bilateral. Vibratory intact via tuning fork bilateral. Protective threshold with Semmes Wienstein monofilament intact to all pedal sites bilateral. Patellar and Achilles deep tendon reflexes 2+ bilateral. No Babinski or clonus noted bilateral.   Musculoskeletal: No gross boney pedal deformities bilateral. No pain, crepitus, or limitation noted with foot and ankle range of motion bilateral. Muscular strength 5/5 in all groups tested bilateral.  Gait: Unassisted, Nonantalgic.    Radiographs:  Taken today demonstrate osteoarthritis of the midfoot no digital abnormalities.  Assessment & Plan:   Assessment: Diabetic peripheral neuropathy  Plan: Increase her gabapentin 600 mg in the morning 1200 mg at night.  I will follow-up with her in 1 month to make sure this is adequate and we may back down on it if it is too much.     Max T. [place], [place]

## 2018-05-21 NOTE — Telephone Encounter (Signed)
Pharmacy called stating that they received prescription for gabapentin for pt and they see that she is also getting the medication from another provider. She is currently getting 472m and taking it 3 times a day from current provider. Pharmacy wanted [place] and has the prescription on hold until we give them a call back.

## 2018-05-22 NOTE — Telephone Encounter (Signed)
I informed Jessica [name] - [facility] of Dr. Stephenie Acres orders and pt is to begin the Gabapentin 61m and stop the prescription from the other doctor.

## 2018-05-22 NOTE — Telephone Encounter (Signed)
Patient is aware of the change.  We are increasing the dosage Thanks.

## 2018-06-18 ENCOUNTER — Telehealth: Payer: Self-pay | Admitting: Endocrinology

## 2018-06-18 NOTE — Telephone Encounter (Signed)
Called pt and left detailed VM informing her of criteria she must meet as indicated by insurance.

## 2018-06-18 NOTE — Telephone Encounter (Signed)
Left detailed message informing pt of Dr. Cordelia Pen decision.

## 2018-06-18 NOTE — Telephone Encounter (Signed)
To qualify, you need to have poor circulation, foot ulcer, amputation, or deformed foot.

## 2018-06-18 NOTE — Telephone Encounter (Signed)
Pt returned call and states she qualifies for the following reasons:  - feet burn - shoes are open - wide toed shoes - on feet for 6 hours - works - Doctor, general practice  Requesting that Dr. Loanne Drilling re-visit the issue and explain why she does not qualify based on the above criteria. Message routed to Dr. Loanne Drilling

## 2018-06-18 NOTE — Telephone Encounter (Signed)
please contact patient: Sorry, you don't qualify for DM shoes.

## 2018-06-25 ENCOUNTER — Telehealth: Payer: Self-pay | Admitting: Podiatry

## 2018-06-25 NOTE — Telephone Encounter (Signed)
Pt returned my call and is upset that he would not sign it and we are going to send it to her pcp Dr Billey Chang and see if they will sign off on the diabetic shoe paperwork.

## 2018-06-25 NOTE — Telephone Encounter (Signed)
Left message for pt that the note from Dr Loanne Drilling states pt does not qualify and to call to discuss further.Marland Kitchen

## 2018-07-15 ENCOUNTER — Ambulatory Visit: Payer: Self-pay

## 2018-07-15 NOTE — Telephone Encounter (Signed)
Incoming call from Patient with a complaint of SOB, chest pain, nausea and lower back pain, mild indigestion.  Began yesterday. States not as smooth, like an asthma attack.  Patient states with everyone using hand sanitizer the scents are bothering her.  Last episode was this am @ 8:30am Denies  Cardiac history, has a his tory of asthmas since childhood. Does report an runny nose and cough.  Reviewed protocol  With Patient.  Patient advised to go to ED or Urgent care.  Patient voiced that she was close to office wanted to come in for a visit. Explained protocol again.  Patient states that if Sx occurred again.she would call EMS.    Reason for Disposition . [1] MODERATE longstanding difficulty breathing (e.g., speaks in phrases, SOB even at rest, pulse 100-120) AND [2] SAME as normal  Answer Assessment - Initial Assessment Questions 1. RESPIRATORY STATUS: "Describe your breathing?" (e.g., wheezing, shortness of breath, unable to speak, severe coughing)      Not smooth, like an asthma attack,  Some sent affect me  2. ONSET: "When did this breathing problem begin?"      Yesterday, chest got tight 3. PATTERN "Does the difficult breathing come and go, or has it been constant since it started?"      Comes and goes last 8:30 4. SEVERITY: "How bad is your breathing?" (e.g., mild, moderate, severe)    - MILD: No SOB at rest, mild SOB with walking, speaks normally in sentences, can lay down, no retractions, pulse < 100.    - MODERATE: SOB at rest, SOB with minimal exertion and prefers to sit, cannot lie down flat, speaks in phrases, mild retractions, audible wheezing, pulse 100-120.    - SEVERE: Very SOB at rest, speaks in single words, struggling to breathe, sitting hunched forward, retractions, pulse > 120      moderate 5. RECURRENT SYMPTOM: "Have you had difficulty breathing before?" If so, ask: "When was the last time?" and "What happened that time?"      denies 6. CARDIAC HISTORY: "Do you have any  history of heart disease?" (e.g., heart attack, angina, bypass surgery, angioplasty)      denies 7. LUNG HISTORY: "Do you have any history of lung disease?"  (e.g., pulmonary embolus, asthma, emphysema)     Asthma, bronchitis 8. CAUSE: "What do you think is causing the breathing problem?"       9. OTHER SYMPTOMS: "Do you have any other symptoms? (e.g., dizziness, runny nose, cough, chest pain, fever)   Runny nose cough Saturday   10. PREGNANCY: "Is there any chance you are pregnant?" "When was your last menstrual period?"       *No Answer* 11. TRAVEL: "Have you traveled out of the country in the last month?" (e.g., travel history, exposures)       *No Answer*  Protocols used: BREATHING DIFFICULTY-A-AH

## 2018-07-17 ENCOUNTER — Encounter: Payer: Self-pay | Admitting: Endocrinology

## 2018-07-17 ENCOUNTER — Ambulatory Visit (INDEPENDENT_AMBULATORY_CARE_PROVIDER_SITE_OTHER): Payer: Medicare Other | Admitting: Endocrinology

## 2018-07-17 ENCOUNTER — Other Ambulatory Visit: Payer: Self-pay

## 2018-07-17 VITALS — BP 134/68 | HR 71 | Ht 66.0 in | Wt 249.6 lb

## 2018-07-17 DIAGNOSIS — E1143 Type 2 diabetes mellitus with diabetic autonomic (poly)neuropathy: Secondary | ICD-10-CM | POA: Diagnosis not present

## 2018-07-17 DIAGNOSIS — M79671 Pain in right foot: Secondary | ICD-10-CM

## 2018-07-17 DIAGNOSIS — M79672 Pain in left foot: Secondary | ICD-10-CM

## 2018-07-17 DIAGNOSIS — Z794 Long term (current) use of insulin: Secondary | ICD-10-CM

## 2018-07-17 LAB — POCT GLYCOSYLATED HEMOGLOBIN (HGB A1C): Hemoglobin A1C: 6.9 % — AB (ref 4.0–5.6)

## 2018-07-17 MED ORDER — INSULIN NPH ISOPHANE & REGULAR (70-30) 100 UNIT/ML ~~LOC~~ SUSP: SUBCUTANEOUS | 5 refills | Status: DC

## 2018-07-17 NOTE — Patient Instructions (Addendum)
Please reduce the insulin to 95 units with breakfast, and 45 units with supper.    check your blood sugar twice a day.  vary the time of day when you check, between before the 3 meals, and at bedtime.  also check if you have symptoms of your blood sugar being too high or too low.  please keep a record of the readings and bring it to your next appointment here (or you can bring the meter itself).  You can write it on any piece of paper.  please call us sooner if your blood sugar goes below 70, or if you have a lot of readings over 200.  Let's check the circulation.  you will receive a phone call, about a day and time for an appointment.   Please come back for a follow-up appointment in 3 months.

## 2018-07-17 NOTE — Progress Notes (Signed)
Subjective:    Patient ID: Jessica [name], female    DOB: 03-22-1951, 68 y.o.   MRN: [id number]  HPI Pt returns for f/u of diabetes mellitus:  DM type: Insulin-requiring type 2. Dx'ed: 2000.  Complications: polyneuropathy and autonomic neuropathy.   Therapy: insulin since 2005 GDM: never DKA: never Severe hypoglycemia: never.  Pancreatitis: never.  Other: her ability to care for her DM is compromised by her husband's illness; she has done better on a BID insulin regimen; she takes human insulin, due to cost.   Interval history: no cbg record, but states cbg's vary from 80-200.  It is in general lower in the afternoon.   Past Medical History:  Diagnosis Date  . Anxiety   . Arthritis    bilateral knees  . Asthma    childhood  . Autonomic neuropathy    diabetic  . Cataract   . Celiac disease   . Depression   . Diabetes mellitus    type II, Hemoglobin A1C 9.9 10/05/2011  . Diabetic neuropathy ([place])   . Diverticulosis   . Fatty liver   . Gastroparesis   . GERD (gastroesophageal reflux disease)   . Hyperlipidemia   . Hypertension   . IBS (irritable bowel syndrome)   . Personal history of colonic polyps 03/2010   hyperplastic  . Pneumonia 11/03/2015   current    Past Surgical History:  Procedure Laterality Date  . APPENDECTOMY    . CATARACT EXTRACTION Right 04/29/2018  . COLONOSCOPY    . DILATATION & CURRETTAGE/HYSTEROSCOPY WITH RESECTOCOPE N/A 03/24/2013   Procedure: DILATATION & CURETTAGE, HYSTEROSCOPY WITH RESECTION;  Surgeon: Olga Millers, MD;  Location: [facility];  Service: Gynecology;  Laterality: N/A;  . HYSTEROSCOPY W/D&C N/A 11/08/2015   Procedure: DILATATION AND CURETTAGE /HYSTEROSCOPY;  Surgeon: Olga Millers, MD;  Location: [facility];  Service: Gynecology;  Laterality: N/A;  . left breast cyst removal  2000  . TUBAL LIGATION      Social History   Socioeconomic History  . Marital status: Married    Spouse name: Devar Agustin  . Number of children: 2  .  Years of education: Not on file  . Highest education level: Not on file  Occupational History  . 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 Financial resource strain: Not on file  . Food insecurity:    Worry: Not on file    Inability: Not on file  . Transportation needs:    Medical: Not on file    Non-medical: Not on file  Tobacco Use  . Smoking status: Never Smoker  . Smokeless tobacco: Never Used  Substance and Sexual Activity  . Alcohol use: No  . Drug use: No  . Sexual activity: Yes    Birth control/protection: Post-menopausal, Surgical  Lifestyle  . Physical activity:    Days per week: Not on file    Minutes per session: Not on file  . Stress: Not on file  Relationships  . Social connections:    Talks on phone: Not on file    Gets together: Not on file    Attends religious service: Not on file    Active member of club or organization: Not on file    Attends meetings of clubs or organizations: Not on file    Relationship status: Not on file  . Intimate partner violence:    Fear of current or ex partner: Not on  file    Emotionally abused: Not on file    Physically abused: Not on file    Forced sexual activity: Not on file  Other Topics Concern  . Not on file  Social History Narrative  . Not on file    Current Outpatient Medications on File Prior to Visit  Medication Sig Dispense Refill  . albuterol (PROVENTIL HFA;VENTOLIN HFA) 108 (90 Base) MCG/ACT inhaler Inhale into the lungs every 6 (six) hours as needed for wheezing or shortness of breath.    Marland Kitchen atorvastatin (LIPITOR) 40 MG tablet Take 1 tablet (40 mg total) by mouth daily. 90 tablet 1  . BD INSULIN SYRINGE U/F 31G X 5/16" 1 ML MISC USE UP TO 3 TIMES A DAY    . BESIVANCE 0.6 % SUSP     . DEXILANT 60 MG capsule TAKE 1 CAPSULE DAILY 30 capsule 2  . DUREZOL 0.05 % EMUL     . Fluticasone-Salmeterol (ADVAIR DISKUS) 100-50 MCG/DOSE AEPB INHALE 1 PUFF  INTO THE LUNGS 2 (TWO) TIMES DAILY. 14 each 1  . gabapentin (NEURONTIN) 600 MG tablet Take one AM, take two qhs 90 tablet 3  . glucose blood (ONE TOUCH ULTRA TEST) test strip 1 each by Other route 2 (two) times daily. And lancets 2/day 200 each 3  . hyoscyamine (LEVSIN SL) 0.125 MG SL tablet Take 1 tablet (0.125 mg total) every 4 (four) hours as needed by mouth. 30 tablet 3  . Ibuprofen (ADVIL) 200 MG CAPS Advil    . ketoconazole (NIZORAL) 2 % cream Apply twice a day for 2 weeks as needed for rash 60 g 2  . olmesartan-hydrochlorothiazide (BENICAR HCT) 40-12.5 MG tablet Take 1 tablet by mouth daily. 90 tablet 3  . polyethylene glycol powder ([facility] PURELAX) powder DISSOLVE 1 CAPFUL IN AT LEAST 8 OUNCES WATER/JUICE AND DRINK TWICE DAILY 1020 g 2  . ranitidine (ZANTAC) 150 MG tablet TAKE 1 TABLET AT BEDTIME 90 tablet 2  . valACYclovir (VALTREX) 500 MG tablet TAKE 1 TABLET BY MOUTH EVERY DAY 60 tablet 3   No current facility-administered medications on file prior to visit.     Allergies  Allergen Reactions  . Gluten Meal Other (See Comments)    No wheat or soy  . Nsaids Nausea And Vomiting    Cannot tolerate large doses     Family History  Problem Relation Age of Onset  . Hypertension Mother   . Colon cancer Sister   . Diabetes Sister   . Diabetes Sister   . Hypertension Brother   . Heart disease Father   . Other Father        2 collapsed lungs  . COPD Father   . Diabetes Father   . Heart attack Father     BP 134/68 (BP Location: Right Arm, Patient Position: Sitting, Cuff Size: Large)   Pulse 71   Ht 5' 6"  (1.676 m)   Wt 249 lb 9.6 oz (113.2 kg)   SpO2 91%   BMI 40.29 kg/m    Review of Systems Denies LOC.  She has pain and numbness of the feet.      Objective:   Physical Exam VITAL SIGNS:  See vs page GENERAL: no distress Pulses: dorsalis pedis intact bilat.   MSK: no deformity of the feet CV: trace bilat leg edema Skin:  no ulcer on the feet.  normal color and temp  on the feet. Neuro: sensation is intact to touch on the feet, but decreased from normal  Lab Results  Component Value Date   HGBA1C 6.9 (A) 07/17/2018   Lab Results  Component Value Date   CREATININE 1.04 10/25/2017   BUN 18 10/25/2017   NA 138 10/25/2017   K 4.2 10/25/2017   CL 102 10/25/2017   CO2 30 10/25/2017       Assessment & Plan:  Insulin-requiring type 2 DM, with PN: overcontrolled, given this regimen, which does match insulin to her changing needs throughout the day.    Patient Instructions  Please reduce the insulin to 95 units with breakfast, and 45 units with supper.    check your blood sugar twice a day.  vary the time of day when you check, between before the 3 meals, and at bedtime.  also check if you have symptoms of your blood sugar being too high or too low.  please keep a record of the readings and bring it to your next appointment here (or you can bring the meter itself).  You can write it on any piece of paper.  please call us sooner if your blood sugar goes below 70, or if you have a lot of readings over 200.  Let's check the circulation.  you will receive a phone call, about a day and time for an appointment.   Please come back for a follow-up appointment in 3 months.

## 2018-07-18 ENCOUNTER — Telehealth: Payer: Self-pay | Admitting: Endocrinology

## 2018-07-18 NOTE — Telephone Encounter (Signed)
For whatever reason there is no referral for this patient - there is not one in work Q or in the patients appointment desk and this is the only phone note I see on this subject- if you see referral please advise

## 2018-07-18 NOTE — Telephone Encounter (Signed)
Please follow up with [facility] re: their concern. Not certain if this is something that goes directly to their work que and whether a correction needs made?? This is the 3rd call I have received that they do not schedule for this type of exam

## 2018-07-18 NOTE — Telephone Encounter (Signed)
Is your order set to go to [facility]?

## 2018-07-18 NOTE — Telephone Encounter (Signed)
[facility] is calling in regards to a referral they received. They stated they do not do the type of ultrasound that Dr Loanne Drilling requested   Crystal would like a call back to discuss this and give some options  PHONE- [phone]

## 2018-07-18 NOTE — Telephone Encounter (Signed)
Any facility is OK

## 2018-07-19 NOTE — Telephone Encounter (Signed)
[facility] [place] LOWER EXT ART SEG MULTI ([facility]) (Order [id number])   The above order can be found under [facility] Procedure

## 2018-07-19 NOTE — Telephone Encounter (Signed)
Thank you so much Ammie, that would be why we could not see it to work on it!! Dr. Loanne Drilling will need to make a referral to send elsewhere

## 2018-07-19 NOTE — Telephone Encounter (Signed)
I do not see the referral that this person is referring to

## 2018-07-22 NOTE — Telephone Encounter (Signed)
Ok, but I need to know who does this.

## 2018-07-22 NOTE — Telephone Encounter (Signed)
FYI

## 2018-07-22 NOTE — Telephone Encounter (Signed)
Could you please contact [facility] to determine alternative places to have this done?

## 2018-07-24 NOTE — Telephone Encounter (Signed)
Referral has been faxed to [facility]

## 2018-08-06 ENCOUNTER — Other Ambulatory Visit: Payer: Self-pay | Admitting: Endocrinology

## 2018-08-07 ENCOUNTER — Encounter: Payer: Medicare Other | Admitting: Family Medicine

## 2018-08-12 ENCOUNTER — Other Ambulatory Visit: Payer: Self-pay | Admitting: Internal Medicine

## 2018-08-12 ENCOUNTER — Encounter: Payer: Medicare Other | Admitting: Family Medicine

## 2018-08-26 ENCOUNTER — Encounter: Payer: Self-pay | Admitting: Internal Medicine

## 2018-08-26 ENCOUNTER — Ambulatory Visit (INDEPENDENT_AMBULATORY_CARE_PROVIDER_SITE_OTHER): Payer: Medicare Other | Admitting: Internal Medicine

## 2018-08-26 ENCOUNTER — Other Ambulatory Visit: Payer: Self-pay

## 2018-08-26 VITALS — Ht 65.0 in | Wt 245.0 lb

## 2018-08-26 DIAGNOSIS — Z800 Family history of malignant neoplasm of digestive organs: Secondary | ICD-10-CM

## 2018-08-26 DIAGNOSIS — R110 Nausea: Secondary | ICD-10-CM

## 2018-08-26 DIAGNOSIS — K219 Gastro-esophageal reflux disease without esophagitis: Secondary | ICD-10-CM | POA: Diagnosis not present

## 2018-08-26 DIAGNOSIS — K581 Irritable bowel syndrome with constipation: Secondary | ICD-10-CM

## 2018-08-26 DIAGNOSIS — R109 Unspecified abdominal pain: Secondary | ICD-10-CM

## 2018-08-26 NOTE — Progress Notes (Signed)
Subjective:   This service was provided via telemedicine.  Telephone encounter The patient was located at home The provider was located in GI office The patient did consent to this telephone visit and is aware of possible charges through their insurance for this visit.   The other persons participating in this telemedicine service were patient and I Time spent on call:  12 min    Patient ID: Jessica [name], female    DOB: 1950/09/08, 68 y.o.   MRN: [id number]  HPI Simrit Gohlke is a 68 year old female with a history of celiac disease, GERD, IBS with constipation, hyperplastic colon polyps, family history of colon cancer in her sister who is seen for follow-up.  She was last seen in the office on 03/19/2017.  She is seen virtually in the setting of COVID-19.  She had an upper endoscopy in January 2019.  Normal esophagus.  Antral nodule which was biopsied and found to be reactive gastropathy.  Negative for H. pylori, dysplasia and metaplasia.  Last colonoscopy was April 2016 which revealed diverticulosis but was otherwise normal.  5-year recall was recommended due to family history of colon cancer in first-degree relative.  She reports that she has been doing okay but feeling a little more sick after eating recently.  Mild nausea.  No real abdominal pain.  Definitively worse with eating.  Not daily.  No specific food triggers.  She has been taking Dexilant 60 mg daily.  This is been controlling her heartburn and reflux symptoms no dysphagia.  Bowel movements regular with MiraLAX which she uses 17 g a day.  This helps incompletely relieves her constipation.  No melena or rectal bleeding.  She does deal with chronic leg pain and burning feet pain particularly at night.  Gabapentin has recently been increased and she wonders if this contributed to her sick feeling and nausea.  She also is using Advil 3 tablets a day for a long period of time.  She is recently started using copper fit socks which she  states has helped her leg pain significantly.  Blood sugars have been better controlled as she has been able to lose some weight.  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  She is working 4 hours a day Monday through Friday as her school is a feeding site for [employer] children.  Since she was last here she had a CT scan of her abdomen and pelvis, which I reviewed.  This showed minimal colonic diverticulosis but was otherwise normal.  Reassuring scan.  Review of Systems As per HPI, otherwise negative    Objective:   Physical Exam No examination, virtual visit  CT ABDOMEN AND PELVIS WITH CONTRAST   TECHNIQUE: Multidetector CT imaging of the abdomen and pelvis was performed using the standard protocol following bolus administration of intravenous contrast.   CONTRAST:  173m ISOVUE-300 IOPAMIDOL (ISOVUE-300) INJECTION 61%   COMPARISON:  Pelvis radiograph dated 09/27/2017. Abdomen and pelvis CT dated 01/07/2016.   FINDINGS: Lower chest: Unremarkable.   Hepatobiliary: No focal liver abnormality is seen. No gallstones, gallbladder wall thickening, or biliary dilatation.   Pancreas: Stable small lipoma or interposed fat in the pancreatic tail.   Spleen: Normal in size without focal abnormality.   Adrenals/Urinary Tract: Adrenal glands are unremarkable. Kidneys are normal, without renal calculi, focal lesion, or hydronephrosis. Bladder  is unremarkable.   Stomach/Bowel: Minimal sigmoid and descending colon diverticulosis without diverticulitis. Unremarkable stomach and small bowel. Surgically absent appendix.   Vascular/Lymphatic: Minimal atheromatous arterial calcifications. No enlarged lymph nodes.   Reproductive: Uterus and bilateral adnexa are unremarkable.   Other: No abdominal wall hernia or  abnormality. No abdominopelvic ascites.   Musculoskeletal: Lumbar and lower thoracic spine degenerative changes. These include extensive facet degenerative changes at the L4-5 level with associated grade 1 anterolisthesis.   IMPRESSION: 1. No acute abnormality. 2. Minimal sigmoid diverticulosis without diverticulitis.     Electronically Signed   By: Claudie Revering M.D.   On: 10/01/2017 15:26       Assessment & Plan:  68 year old female with a history of celiac disease, GERD, IBS with constipation, hyperplastic colon polyps, family history of colon cancer in her sister who is seen for follow-up.  1.  Nausea/upper abdominal discomfort/GERD--I am suspicious for gastritis perhaps related to NSAIDs.  Cannot exclude that her nausea relates to her gabapentin.  Gastroparesis also in the differential but she has had no vomiting, symptoms mild and blood sugars more recently better controlled.  I would like her to reduce if not eliminate ibuprofen for a couple of weeks and see if symptoms improve.  She is already on PPI.  Imaging 10 months ago reassuring. --Cut back if not eliminate ibuprofen for the next 2 to 3 weeks --Continue Dexilant 60 mg a day --Call back around May 15 to let me know if nausea or upper abdominal discomfort continues, also okay for virtual visit in about a month if needed  2.  Chronic constipation and IBS--well treated with MiraLAX.  Continue MiraLAX 17 g a day.  3.  Family history of colon cancer --repeat screening colonoscopy recommended April 2021  4.  History of celiac disease --continue gluten-free diet

## 2018-08-27 MED ORDER — DEXLANSOPRAZOLE 60 MG PO CPDR: 1.0000 | DELAYED_RELEASE_CAPSULE | Freq: Every day | ORAL | 3 refills | Status: DC

## 2018-08-27 NOTE — Addendum Note (Signed)
Addended by: Larina Bras on: 08/27/2018 02:22 PM   Modules accepted: Orders

## 2018-08-27 NOTE — Patient Instructions (Addendum)
Cut back or eliminate ibuprofen for the next 2 to 3 weeks!  Continue Dexilant 60 mg a day.  Call back around May 15 (or mychart message [place]) to let us know if nausea or upper abdominal discomfort continues.  Please follow up with a virtual visit (Telephone visit) in 1 month if needed.  Continue MiraLAX 17 g a day.  You will be due for a recall colonoscopy in 07/2019. We will send you a reminder in the mail when it gets closer to that time.  Continue a gluten-free diet.

## 2018-08-28 ENCOUNTER — Other Ambulatory Visit: Payer: Self-pay | Admitting: Internal Medicine

## 2018-08-28 NOTE — Telephone Encounter (Signed)
Pt requested a refill on polyethylene glycol.

## 2018-09-18 ENCOUNTER — Ambulatory Visit (HOSPITAL_COMMUNITY)
Admission: RE | Admit: 2018-09-18 | Discharge: 2018-09-18 | Disposition: A | Discharge status: Home / Self Care | Payer: Medicare Other | Source: Ambulatory Visit | Attending: Cardiology | Admitting: Cardiology

## 2018-09-18 ENCOUNTER — Other Ambulatory Visit: Payer: Self-pay

## 2018-09-18 DIAGNOSIS — M79672 Pain in left foot: Secondary | ICD-10-CM | POA: Insufficient documentation

## 2018-09-18 DIAGNOSIS — M79671 Pain in right foot: Secondary | ICD-10-CM | POA: Insufficient documentation

## 2018-09-20 ENCOUNTER — Other Ambulatory Visit: Payer: Self-pay

## 2018-09-20 MED ORDER — GABAPENTIN 600 MG PO TABS: ORAL_TABLET | ORAL | 3 refills | Status: DC

## 2018-09-20 NOTE — Telephone Encounter (Signed)
Pharmacy refill request for Gabapentin 636m.  Per Dr. HStephenie Acresverbal order, ok to refill    Script has been sent to pharmacy

## 2018-09-26 DIAGNOSIS — H20043 Secondary noninfectious iridocyclitis, bilateral: Secondary | ICD-10-CM | POA: Diagnosis not present

## 2018-10-02 ENCOUNTER — Telehealth: Payer: Self-pay | Admitting: Family Medicine

## 2018-10-02 NOTE — Telephone Encounter (Signed)
Copied from [facility] [id number]. Topic: Quick Communication - Rx Refill/Question >> Oct 02, 2018  3:04 PM Margot Ables wrote: Medication: atorvastatin (LIPITOR) 40 MG tablet - pt has 3 left - pt states pharmacy contacted Dr. Jonni Sanger 2 days ago and told her no response.   Has the patient contacted their pharmacy? yes Preferred Pharmacy (with phone number or street name): [id number] [place], [place] - [address] [phone](Phone) [phone](Fax)

## 2018-10-02 NOTE — Telephone Encounter (Signed)
See request °

## 2018-10-04 MED ORDER — ATORVASTATIN CALCIUM 40 MG PO TABS: 40.0000 mg | ORAL_TABLET | Freq: Every day | ORAL | 1 refills | Status: DC

## 2018-10-04 NOTE — Telephone Encounter (Signed)
Pt aware RX sent

## 2018-10-08 DIAGNOSIS — N858 Other specified noninflammatory disorders of uterus: Secondary | ICD-10-CM | POA: Diagnosis not present

## 2018-10-08 DIAGNOSIS — N8500 Endometrial hyperplasia, unspecified: Secondary | ICD-10-CM | POA: Diagnosis not present

## 2018-10-08 DIAGNOSIS — Z8049 Family history of malignant neoplasm of other genital organs: Secondary | ICD-10-CM | POA: Insufficient documentation

## 2018-10-10 ENCOUNTER — Telehealth: Payer: Self-pay

## 2018-10-10 NOTE — Telephone Encounter (Signed)
Erroneous encounter. Disregard.

## 2018-10-10 NOTE — Telephone Encounter (Signed)
Called and spoke with patient.  She states that she has some c/o intermittent chest pain on and off x 3 mos.  She denies any acute chest pain at time of call, SOB, pain radiating down arms, diaphoresis, blurry vision, dizziness or headaches.  States that when she is having symptoms, she has a "shooting pain across her chest" accompanied by nausea, lower back pain and some indigestion.  Also c/o mild SOB w/symptoms as well.  Patient denies any cardiac history but does have hypertension and diabetes.  She has an appt scheduled with Dr. Jonni Sanger on 6/16 for a cpe but states that she was contacted and told that this needed to be r/s due to Dr. Jonni Sanger not being in the office that day.  Appt r/s to 6/17.  Also spoke with Dr. Jonni Sanger who is fine with seeing patient for cpe and c/o chest pain.  I did explain in detail to patient that since Dr. Jonni Sanger was addressing this issue in addition to doing her physical, it would be 2 separate charges.  Patient expressed verbal understanding and wanted to proceed w/appointment.

## 2018-10-10 NOTE — Telephone Encounter (Signed)
Incoming call from patient who was c/o chest pain shooting across her chest.  Call was transferred to me to triage but must have been d/c because it rang once and then stopped before I could answer.  I attempted to contact patient back on her cell phone # listed but the call went to voicemail.  I did leave a detailed message requesting a call back from patient.

## 2018-10-12 ENCOUNTER — Other Ambulatory Visit: Payer: Self-pay | Admitting: Endocrinology

## 2018-10-14 NOTE — Telephone Encounter (Signed)
Please forward refill request to pt's new primary care provider.  

## 2018-10-15 ENCOUNTER — Encounter: Payer: Medicare Other | Admitting: Family Medicine

## 2018-10-16 ENCOUNTER — Other Ambulatory Visit: Payer: Self-pay

## 2018-10-16 ENCOUNTER — Ambulatory Visit (INDEPENDENT_AMBULATORY_CARE_PROVIDER_SITE_OTHER): Payer: Medicare Other | Admitting: Family Medicine

## 2018-10-16 ENCOUNTER — Encounter: Payer: Self-pay | Admitting: Family Medicine

## 2018-10-16 VITALS — BP 142/70 | HR 67 | Temp 98.3°F | Resp 16 | Ht 66.0 in | Wt 243.8 lb

## 2018-10-16 DIAGNOSIS — Z794 Long term (current) use of insulin: Secondary | ICD-10-CM

## 2018-10-16 DIAGNOSIS — K3184 Gastroparesis: Secondary | ICD-10-CM

## 2018-10-16 DIAGNOSIS — E118 Type 2 diabetes mellitus with unspecified complications: Secondary | ICD-10-CM

## 2018-10-16 DIAGNOSIS — R072 Precordial pain: Secondary | ICD-10-CM

## 2018-10-16 DIAGNOSIS — E1143 Type 2 diabetes mellitus with diabetic autonomic (poly)neuropathy: Secondary | ICD-10-CM | POA: Diagnosis not present

## 2018-10-16 DIAGNOSIS — E7800 Pure hypercholesterolemia, unspecified: Secondary | ICD-10-CM | POA: Diagnosis not present

## 2018-10-16 DIAGNOSIS — I10 Essential (primary) hypertension: Secondary | ICD-10-CM

## 2018-10-16 LAB — CBC WITH DIFFERENTIAL/PLATELET
Basophils Absolute: 0 10*3/uL (ref 0.0–0.1)
Basophils Relative: 0.9 % (ref 0.0–3.0)
Eosinophils Absolute: 0.1 10*3/uL (ref 0.0–0.7)
Eosinophils Relative: 2.6 % (ref 0.0–5.0)
HCT: 40.3 % (ref 36.0–46.0)
Hemoglobin: 13.5 g/dL (ref 12.0–15.0)
Lymphocytes Relative: 41 % (ref 12.0–46.0)
Lymphs Abs: 1.4 10*3/uL (ref 0.7–4.0)
MCHC: 33.5 g/dL (ref 30.0–36.0)
MCV: 98.3 fl (ref 78.0–100.0)
Monocytes Absolute: 0.2 10*3/uL (ref 0.1–1.0)
Monocytes Relative: 5.6 % (ref 3.0–12.0)
Neutro Abs: 1.7 10*3/uL (ref 1.4–7.7)
Neutrophils Relative %: 49.9 % (ref 43.0–77.0)
Platelets: 227 10*3/uL (ref 150.0–400.0)
RBC: 4.1 Mil/uL (ref 3.87–5.11)
RDW: 13.6 % (ref 11.5–15.5)
WBC: 3.5 10*3/uL — ABNORMAL LOW (ref 4.0–10.5)

## 2018-10-16 LAB — COMPREHENSIVE METABOLIC PANEL
ALT: 12 U/L (ref 0–35)
AST: 12 U/L (ref 0–37)
Albumin: 4.1 g/dL (ref 3.5–5.2)
Alkaline Phosphatase: 91 U/L (ref 39–117)
BUN: 20 mg/dL (ref 6–23)
CO2: 29 mEq/L (ref 19–32)
Calcium: 9.4 mg/dL (ref 8.4–10.5)
Chloride: 101 mEq/L (ref 96–112)
Creatinine, Ser: 1.03 mg/dL (ref 0.40–1.20)
GFR: 64.47 mL/min (ref >60.00)
Glucose, Bld: 188 mg/dL — ABNORMAL HIGH (ref 70–99)
Potassium: 4.4 mEq/L (ref 3.5–5.1)
Sodium: 137 mEq/L (ref 135–145)
Total Bilirubin: 0.5 mg/dL (ref 0.2–1.2)
Total Protein: 6.6 g/dL (ref 6.0–8.3)

## 2018-10-16 LAB — LIPID PANEL
Cholesterol: 134 mg/dL (ref 0–200)
HDL: 47.4 mg/dL (ref >39.00)
LDL Cholesterol: 74 mg/dL (ref 0–99)
NonHDL: 86.55
Total CHOL/HDL Ratio: 3
Triglycerides: 64 mg/dL (ref 0.0–149.0)
VLDL: 12.8 mg/dL (ref 0.0–40.0)

## 2018-10-16 LAB — TSH: TSH: 2.15 u[IU]/mL (ref 0.35–4.50)

## 2018-10-16 MED ORDER — SHINGRIX 50 MCG/0.5ML IM SUSR: 0.5000 mL | Freq: Once | INTRAMUSCULAR | 0 refills | Status: AC

## 2018-10-16 NOTE — Patient Instructions (Signed)
Please return in 3 months for blood pressure and f/u chest pain   I will release your lab results to you on your MyChart account with further instructions. Please reply with any questions.   We will call you with information regarding your referral appointment. Cardiac stress testing.  If you do not hear from Jessica [name] within the next 2 weeks, please let me know. It can take 1-2 weeks to get appointments set up with the specialists.   If you have any questions or concerns, please don't hesitate to send me a message via MyChart or call the office at [phone]. Thank you for visiting with Jessica [name] today! It's our pleasure caring for you.   Preventive Care 68 Years and Older, Female Preventive care refers to lifestyle choices and visits with your health care provider that can promote health and wellness. What does preventive care include?  A yearly physical exam. This is also called an annual well check.  Dental exams once or twice a year.  Routine eye exams. Ask your health care provider how often you should have your eyes checked.  Personal lifestyle choices, including: ? Daily care of your teeth and gums. ? Regular physical activity. ? Eating a healthy diet. ? Avoiding tobacco and drug use. ? Limiting alcohol use. ? Practicing safe sex. ? Taking low-dose aspirin every day. ? Taking vitamin and mineral supplements as recommended by your health care provider. What happens during an annual well check? The services and screenings done by your health care provider during your annual well check will depend on your age, overall health, lifestyle risk factors, and family history of disease. Counseling Your health care provider may ask you questions about your:  Alcohol use.  Tobacco use.  Drug use.  Emotional well-being.  Home and relationship well-being.  Sexual activity.  Eating habits.  History of falls.  Memory and ability to understand (cognition).  Work and work Statistician.   Reproductive health.  Screening You may have the following tests or measurements:  Height, weight, and BMI.  Blood pressure.  Lipid and cholesterol levels. These may be checked every 5 years, or more frequently if you are over 33 years old.  Skin check.  Lung cancer screening. You may have this screening every year starting at age 67 if you have a 30-pack-year history of smoking and currently smoke or have quit within the past 15 years.  Colorectal cancer screening. All adults should have this screening starting at age 56 and continuing until age 62. You will have tests every 1-10 years, depending on your results and the type of screening test. People at increased risk should start screening at an earlier age. Screening tests may include: ? Guaiac-based fecal occult blood testing. ? Fecal immunochemical test (FIT). ? Stool DNA test. ? Virtual colonoscopy. ? Sigmoidoscopy. During this test, a flexible tube with a tiny camera (sigmoidoscope) is used to examine your rectum and lower colon. The sigmoidoscope is inserted through your anus into your rectum and lower colon. ? Colonoscopy. During this test, a long, thin, flexible tube with a tiny camera (colonoscope) is used to examine your entire colon and rectum.  Hepatitis C blood test.  Hepatitis B blood test.  Sexually transmitted disease (STD) testing.  Diabetes screening. This is done by checking your blood sugar (glucose) after you have not eaten for a while (fasting). You may have this done every 1-3 years.  Bone density scan. This is done to screen for osteoporosis. You may have this done  starting at age 23.  Mammogram. This may be done every 1-2 years. Talk to your health care provider about how often you should have regular mammograms. Talk with your health care provider about your test results, treatment options, and if necessary, the need for more tests. Vaccines Your health care provider may recommend certain vaccines, such  as:  Influenza vaccine. This is recommended every year.  Tetanus, diphtheria, and acellular pertussis (Tdap, Td) vaccine. You may need a Td booster every 10 years.  Varicella vaccine. You may need this if you have not been vaccinated.  Zoster vaccine. You may need this after age 34.  Measles, mumps, and rubella (MMR) vaccine. You may need at least one dose of MMR if you were born in 1957 or later. You may also need a second dose.  Pneumococcal 13-valent conjugate (PCV13) vaccine. One dose is recommended after age 89.  Pneumococcal polysaccharide (PPSV23) vaccine. One dose is recommended after age 37.  Meningococcal vaccine. You may need this if you have certain conditions.  Hepatitis A vaccine. You may need this if you have certain conditions or if you travel or work in places where you may be exposed to hepatitis A.  Hepatitis B vaccine. You may need this if you have certain conditions or if you travel or work in places where you may be exposed to hepatitis B.  Haemophilus influenzae type b (Hib) vaccine. You may need this if you have certain conditions. Talk to your health care provider about which screenings and vaccines you need and how often you need them. This information is not intended to replace advice given to you by your health care provider. Make sure you discuss any questions you have with your health care provider. Document Released: 05/14/2015 Document Revised: 06/07/2017 Document Reviewed: 02/16/2015 Elsevier Interactive Patient Education  2019 [employer]   Nonspecific Chest Pain, Adult Chest pain can be caused by many different conditions. It can be caused by a condition that is life-threatening and requires treatment right away. It can also be caused by something that is not life-threatening. If you have chest pain, it can be hard to know the difference, so it is important to get help right away to make sure that you do not have a serious condition. Some  life-threatening causes of chest pain include:  Heart attack.  A tear in the body's main blood vessel (aortic dissection).  Inflammation around your heart (pericarditis).  A problem in the lungs, such as a blood clot (pulmonary embolism) or a collapsed lung (pneumothorax). Some non life-threatening causes of chest pain include:  Heartburn.  Anxiety or stress.  Damage to the bones, muscles, and cartilage that make up your chest wall.  Pneumonia or bronchitis.  Shingles infection (varicella-zoster virus). Chest pain can feel like:  Pain or discomfort on the surface of your chest or deep in your chest.  Crushing, pressure, aching, or squeezing pain.  Burning or tingling.  Dull or sharp pain that is worse when you move, cough, or take a deep breath.  Pain or discomfort that is also felt in your back, neck, jaw, shoulder, or arm, or pain that spreads to any of these areas. Your chest pain may come and go. It may also be constant. Your health care provider will do lab tests and other studies to find the cause of your pain. Treatment will depend on the cause of your chest pain. Follow these instructions at home: Medicines  Take over-the-counter and prescription medicines only as told  by your health care provider.  If you were prescribed an antibiotic, take it as told by your health care provider. Do not stop taking the antibiotic even if you start to feel better. Lifestyle   Rest as directed by your health care provider.  Do not use any products that contain nicotine or tobacco, such as cigarettes and e-cigarettes. If you need help quitting, ask your health care provider.  Do not drink alcohol.  Make healthy lifestyle choices as recommended. These may include: ? Getting regular exercise. Ask your health care provider to suggest some activities that are safe for you. ? Eating a heart-healthy diet. This includes plenty of fresh fruits and vegetables, whole grains, low-fat  (lean) protein, and low-fat dairy products. A dietitian can help you find healthy eating options. ? Maintaining a healthy weight. ? Managing any other health conditions you have, such as high blood pressure (hypertension) or diabetes. ? Reducing stress, such as with yoga or relaxation techniques. General instructions  Pay attention to any changes in your symptoms. Tell your health care provider about them or any new symptoms.  Avoid any activities that cause chest pain.  Keep all follow-up visits as told by your health care provider. This is important. This includes visits for any further testing if your chest pain does not go away. Contact a health care provider if:  Your chest pain does not go away.  You feel depressed.  You have a fever. Get help right away if:  Your chest pain gets worse.  You have a cough that gets worse, or you cough up blood.  You have severe pain in your abdomen.  You faint.  You have sudden, unexplained chest discomfort.  You have sudden, unexplained discomfort in your arms, back, neck, or jaw.  You have shortness of breath at any time.  You suddenly start to sweat, or your skin gets clammy.  You feel nausea or you vomit.  You suddenly feel lightheaded or dizzy.  You have severe weakness, or unexplained weakness or fatigue.  Your heart begins to beat quickly, or it feels like it is skipping beats. These symptoms may represent a serious problem that is an emergency. Do not wait to see if the symptoms will go away. Get medical help right away. Call your local emergency services (911 in the [place] Do not drive yourself to the hospital. Summary  Chest pain can be caused by a condition that is serious and requires urgent treatment. It may also be caused by something that is not life-threatening.  If you have chest pain, it is very important to see your health care provider. Your health care provider may do lab tests and other studies to find the  cause of your pain.  Follow your health care provider's instructions on taking medicines, making lifestyle changes, and getting emergency treatment if symptoms become worse.  Keep all follow-up visits as told by your health care provider. This includes visits for any further testing if your chest pain does not go away. This information is not intended to replace advice given to you by your health care provider. Make sure you discuss any questions you have with your health care provider. Document Released: 01/25/2005 Document Revised: 10/18/2017 Document Reviewed: 10/18/2017 Elsevier Interactive Patient Education  2019 [employer].

## 2018-10-16 NOTE — Progress Notes (Signed)
Subjective  CC:  Chief Complaint  Patient presents with  . Annual Exam    She is fasting  . Chest Pain    On and off for 3 months and getting more frequent.. Had an episode last night  . Hypertension  . Hand pain    bilateral, right is worse.. In the morning she has stiffness/tightness and fingers turning down    HPI: Jessica [name] is a 68 y.o. female who presents to the office today for follow up of diabetes, hypertension and problems listed above in the chief complaint.   Diabetic f/u: Her diabetic control is reported as Unchanged. Dr. Loanne Drilling manages. Last visit was in march.  She watches what she eats closely. She denies exertional CP or SOB or symptomatic hypoglycemia. She denies foot sores.  She has symptomatic peripheral neuropathy managed by gabapentin   Hypertension f/u: Control is good . Pt reports she is doing well. taking medications as instructed, no medication side effects noted, no TIAs, no chest pain on exertion, no dyspnea on exertion, no swelling of ankles.  She has not been checking her blood pressures at home but they have been well controlled.  She does have a blood pressure cuff at home. She denies adverse effects from his BP medications. Compliance with medication is good.    Hyperlipidemia f/u: Patient presents for follow up of lipids. Lipids have been well controlled on meds w/o myalgias or side effects. Compliance with treatment thus far has been good. The patient does use medications that may worsen dyslipidemias (corticosteroids, progestins, anabolic steroids, diuretics, beta-blockers, amiodarone, cyclosporine, olanzapine). The patient exercises Rarely due to knee pain. The patient is not known to have coexisting coronary artery disease.   Patient complains of worsening left-sided chest pain.  She reports that had been going on and off for the last year or so.  She had discussed this with her endocrinologist who did an EKG that was normal.  However she has had  2 episodes that were more significant recently.  She describes left-sided chest pain as aching and sharp.  2 weeks ago it lasted about 4 to 6 hours but also was associated with shortness of breath and nausea.  Not related to exertion.  She does have multiple cardiovascular risk factors.  She also has gastroparesis and IBS that can cause abdominal symptoms.  However she felt this chest pain was very different than her usual abdominal symptoms.  She denies palpitations, lower extremity edema, claudication.  She has not had an ischemia evaluation in the past.  Review of systems is positive for bilateral but worse on the right numbness and tingling in the hand.  No red hot swollen joints.   Assessment  1. Substernal chest pain   2. Type 2 diabetes mellitus with complication, with long-term current use of insulin ([place])   3. Essential hypertension   4. Hypercholesterolemia   5. Diabetic gastroparesis ([place])      Plan   Diabetes is currently well controlled.  Managed by endocrinology  Hypertension is currently adequately controlled.  Patient will check home blood pressure readings to ensure stability.  I will adjust medications if home readings are elevated.    Hyperlipidemia f/u: LDL is at goal on statins.  Recheck lab work today  Substernal chest pain: Ongoing but progressive in nature.  Due to worsening left-sided symptoms, recommend stress testing due to her multiple cardiovascular risk factors.  Testing ordered.  Continue current medications, limit exertion and to the emergency room  for severe chest pain.  Probable carpal tunnel on right: Start with nighttime splinting as needed and follow-up if not improving.  Health maintenance:Screens are up-to-date.  Shingrix prescription given again. Diabetic education: ongoing education regarding chronic disease management for diabetes was given today. We continue to reinforce the ABC's of diabetic management: A1c (<7 or 8 dependent upon patient), tight  blood pressure control, and cholesterol management with goal LDL < 100 minimally. We discuss diet strategies, exercise recommendations, medication options and possible side effects. At each visit, we review recommended immunizations and preventive care recommendations for diabetics and stress that good diabetic control can prevent other problems. See below for this patient's data. Hypertension education: ongoing education regarding management of these chronic disease states was given. Management strategies discussed on successive visits include dietary and exercise recommendations, goals of achieving and maintaining IBW, and lifestyle modifications aiming for adequate sleep and minimizing stressors.   Follow up: Return in about 3 months (around 01/16/2019) for follow up Hypertension and chest pain..  Orders Placed This Encounter  Procedures  . CBC with Differential/Platelet  . Comprehensive metabolic panel  . Lipid panel  . TSH  . Myocardial Perfusion Imaging  . EKG 12-Lead   Meds ordered this encounter  Medications  . Zoster Vaccine Adjuvanted [facility]) injection    Sig: Inject 0.5 mLs into the muscle once for 1 dose. Please give 2nd dose 2-6 months after first dose    Dispense:  2 each    Refill:  0      I reviewed the patients updated PMH, FH, and SocHx.  Patient Active Problem List   Diagnosis Date Noted  . Celiac disease 02/14/2018    Priority: High  . Diabetic neuropathy associated with type 2 diabetes mellitus ([place]) 02/14/2018    Priority: High  . Morbid obesity ([place]) 02/14/2018    Priority: High  . Type 2 diabetes mellitus with complication, with long-term current use of insulin ([place]) 09/23/2010    Priority: High  . Hypercholesterolemia 01/29/2009    Priority: High  . Essential hypertension 10/22/2006    Priority: High  . Osteopenia 02/14/2018    Priority: Medium  . Mild intermittent asthma 02/14/2018    Priority: Medium  . Degenerative arthritis of knee, bilateral  10/29/2015    Priority: Medium  . Degenerative cervical disc 07/27/2015    Priority: Medium  . OSA (obstructive sleep apnea) 02/28/2011    Priority: Medium  . Constipation, slow transit 09/23/2010    Priority: Medium  . FH: colon cancer 09/23/2010    Priority: Medium  . Personal history of colonic polyps 09/23/2010    Priority: Medium  . Diabetic gastroparesis ([place]) 02/27/2007    Priority: Medium  . Diabetic autonomic neuropathy associated with type 2 diabetes mellitus ([place]) 10/22/2006    Priority: Medium  . Fibroma of tongue 06/06/2016    Priority: Low  . Family history of malignant neoplasm of endometrium 10/08/2018  . Foot pain, bilateral 07/17/2018   Immunization History  Administered Date(s) Administered  . Influenza,inj,Quad PF,6+ Mos 02/14/2018  . Pneumococcal Conjugate-13 11/03/2015  . Pneumococcal Polysaccharide-23 08/17/2010, 02/14/2018  . Td 12/31/2002  . Tdap 10/25/2017   Health Maintenance  Topic Date Due  . INFLUENZA VACCINE  11/30/2018  . MAMMOGRAM  11/30/2018  . HEMOGLOBIN A1C  01/17/2019  . FOOT EXAM  07/17/2019  . COLONOSCOPY  08/25/2019  . OPHTHALMOLOGY EXAM  10/02/2019  . DEXA SCAN  10/31/2019  . TETANUS/TDAP  10/26/2027  . Hepatitis C Screening  Completed  .  PNA vac Low Risk Adult  Completed   Diabetes and HTN Related Lab Review: Lab Results  Component Value Date   HGBA1C 6.9 (A) 07/17/2018   HGBA1C 6.5 (A) 03/13/2018   HGBA1C 7.2 (A) 10/25/2017    Lab Results  Component Value Date   MICROALBUR 0.7 10/25/2017   Lab Results  Component Value Date   CREATININE 1.04 10/25/2017   BUN 18 10/25/2017   NA 138 10/25/2017   K 4.2 10/25/2017   CL 102 10/25/2017   CO2 30 10/25/2017   Lab Results  Component Value Date   CHOL 133 10/25/2017   CHOL 143 10/02/2016   CHOL 128 11/03/2015   Lab Results  Component Value Date   HDL 40.60 10/25/2017   HDL 43.10 10/02/2016   HDL 44.90 11/03/2015   Lab Results  Component Value Date   LDLCALC 77  10/25/2017   LDLCALC 87 10/02/2016   LDLCALC 70 11/03/2015   Lab Results  Component Value Date   TRIG 75.0 10/25/2017   TRIG 68.0 10/02/2016   TRIG 67.0 11/03/2015   Lab Results  Component Value Date   CHOLHDL 3 10/25/2017   CHOLHDL 3 10/02/2016   CHOLHDL 3 11/03/2015   Lab Results  Component Value Date   LDLDIRECT 143.2 11/23/2006   The 10-year ASCVD risk score Mikey Bussing DC Jr., et al., 2013) is: 21%   Values used to calculate the score:     Age: 66 years     Sex: Female     Is Non-Hispanic African American: Yes     Diabetic: Yes     Tobacco smoker: No     Systolic Blood Pressure: 096 mmHg     Is BP treated: Yes     HDL Cholesterol: 40.6 mg/dL     Total Cholesterol: 133 mg/dL  BP Readings from Last 3 Encounters:  10/16/18 (!) 142/70  07/17/18 134/68  05/21/18 (!) 120/59   Wt Readings from Last 3 Encounters:  10/16/18 243 lb 12.8 oz (110.6 kg)  08/26/18 245 lb (111.1 kg)  07/17/18 249 lb 9.6 oz (113.2 kg)    Allergies: Patient is allergic to gluten meal and nsaids. Family History: Patient family history includes COPD in her father; Colon cancer in her sister; Diabetes in her father and sister; Endometrial cancer in her sister; Heart attack in her father; Heart failure in her father; Hypertension in her brother and mother; Other in her father. Social History:  Patient  reports that she has never smoked. She has never used smokeless tobacco. She reports that she does not drink alcohol or use drugs.  Review of Systems: Ophthalmic: negative for eye pain, loss of vision or double vision Cardiovascular: negative for chest pain Respiratory: negative for SOB or persistent cough Gastrointestinal: negative for abdominal pain Genitourinary: negative for dysuria or gross hematuria MSK: negative for foot lesions Neurologic: negative for weakness or gait disturbance Current Meds  Medication Sig  . albuterol (PROVENTIL HFA;VENTOLIN HFA) 108 (90 Base) MCG/ACT inhaler Inhale  into the lungs every 6 (six) hours as needed for wheezing or shortness of breath.  Marland Kitchen atorvastatin (LIPITOR) 40 MG tablet Take 1 tablet (40 mg total) by mouth daily.  . BD INSULIN SYRINGE U/F 31G X 5/16" 1 ML MISC USE UP TO 3 TIMES A DAY  . dexlansoprazole (DEXILANT) 60 MG capsule Take 1 capsule (60 mg total) by mouth daily.  . Fluticasone-Salmeterol (ADVAIR DISKUS) 100-50 MCG/DOSE AEPB INHALE 1 PUFF INTO THE LUNGS 2 (TWO) TIMES DAILY.  Marland Kitchen  gabapentin (NEURONTIN) 600 MG tablet Take one AM, take two qhs  . glucose blood (ONE TOUCH ULTRA TEST) test strip 1 each by Other route 2 (two) times daily. And lancets 2/day  . insulin NPH-regular Human (HUMULIN 70/30) (70-30) 100 UNIT/ML injection INJECT 110 UNITS WITH BREAKFAST AND 50 UNITS WITH EVENING MEAL (Patient taking differently: INJECT 95 UNITS WITH BREAKFAST AND 50 UNITS WITH EVENING MEAL)  . olmesartan-hydrochlorothiazide (BENICAR HCT) 40-12.5 MG tablet Take 1 tablet by mouth daily.  . polyethylene glycol powder ([employer] PURELAX) 17 GM/SCOOP powder DISSOLVE 1 CAPFUL IN AT LEAST 8 OUNCES WATER/JUICE AND DRINK TWICE DAILY  . valACYclovir (VALTREX) 500 MG tablet TAKE 1 TABLET BY MOUTH EVERY DAY  . [DISCONTINUED] ibuprofen (ADVIL) 200 MG tablet Take by mouth. Take 2 tablets every morning, 1 tablet every bedtime.    Objective  Vitals: BP (!) 142/70   Pulse 67   Temp 98.3 F (36.8 C) (Oral)   Resp 16   Ht 5' 6"  (1.676 m)   Wt 243 lb 12.8 oz (110.6 kg)   SpO2 97%   BMI 39.35 kg/m  General: well appearing, no acute distress  Psych:  Alert and oriented, normal mood and affect HEENT:  Normocephalic, atraumatic, moist mucous membranes, supple neck  Cardiovascular:  Nl S1 and S2, RRR without murmur, gallop or rub. no edema Respiratory:  Good breath sounds bilaterally, CTAB with normal effort, no rales Gastrointestinal: normal BS, soft, nontender Skin:  Warm, no rashes Neurologic:   Mental status is normal. 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 Patient is instructed to call or message via MyChart if he/she has any questions or concerns regarding our treatment plan. No barriers to understanding were identified. We discussed Red Flag symptoms and signs in detail. Patient expressed understanding regarding what to do in case of urgent or emergency type symptoms.   Medication list was reconciled, printed and provided to the patient in AVS. Patient instructions and summary information was reviewed with the patient as documented in the AVS. This note was prepared with assistance of Dragon voice recognition software. Occasional wrong-word or sound-a-like substitutions may have occurred due to the inherent limitations of voice recognition software

## 2018-10-17 ENCOUNTER — Telehealth (HOSPITAL_COMMUNITY): Payer: Self-pay | Admitting: *Deleted

## 2018-10-17 NOTE — Telephone Encounter (Signed)
Close encounter 

## 2018-10-23 ENCOUNTER — Ambulatory Visit (HOSPITAL_COMMUNITY)
Admission: RE | Admit: 2018-10-23 | Discharge: 2018-10-23 | Disposition: A | Discharge status: Home / Self Care | Payer: Medicare Other | Source: Ambulatory Visit | Attending: Internal Medicine | Admitting: Internal Medicine

## 2018-10-23 ENCOUNTER — Other Ambulatory Visit: Payer: Self-pay

## 2018-10-23 DIAGNOSIS — R072 Precordial pain: Secondary | ICD-10-CM | POA: Insufficient documentation

## 2018-10-23 DIAGNOSIS — I10 Essential (primary) hypertension: Secondary | ICD-10-CM | POA: Insufficient documentation

## 2018-10-23 DIAGNOSIS — E118 Type 2 diabetes mellitus with unspecified complications: Secondary | ICD-10-CM

## 2018-10-23 DIAGNOSIS — Z794 Long term (current) use of insulin: Secondary | ICD-10-CM | POA: Diagnosis not present

## 2018-10-23 DIAGNOSIS — E7800 Pure hypercholesterolemia, unspecified: Secondary | ICD-10-CM | POA: Diagnosis not present

## 2018-10-23 LAB — MYOCARDIAL PERFUSION IMAGING
LV dias vol: 80 mL (ref 46–106)
LV sys vol: 34 mL
Peak HR: 86 {beats}/min
Rest HR: 60 {beats}/min
SDS: 0
SRS: 12
SSS: 12
TID: 1.17

## 2018-10-23 MED ORDER — TECHNETIUM TC 99M TETROFOSMIN IV KIT
31.5000 | PACK | Freq: Once | INTRAVENOUS | Status: AC | PRN
  Administered 2018-10-23: 31.5 via INTRAVENOUS
  Filled 2018-10-23: qty 32

## 2018-10-23 MED ORDER — REGADENOSON 0.4 MG/5ML IV SOLN
0.4000 mg | Freq: Once | INTRAVENOUS | Status: AC
  Administered 2018-10-23: 0.4 mg via INTRAVENOUS

## 2018-10-23 MED ORDER — TECHNETIUM TC 99M TETROFOSMIN IV KIT
10.5000 | PACK | Freq: Once | INTRAVENOUS | Status: AC | PRN
  Administered 2018-10-23: 10.5 via INTRAVENOUS
  Filled 2018-10-23: qty 11

## 2018-10-24 ENCOUNTER — Other Ambulatory Visit: Payer: Self-pay | Admitting: *Deleted

## 2018-10-24 ENCOUNTER — Encounter: Payer: Self-pay | Admitting: Family Medicine

## 2018-10-24 DIAGNOSIS — R9439 Abnormal result of other cardiovascular function study: Secondary | ICD-10-CM

## 2018-10-24 DIAGNOSIS — R072 Precordial pain: Secondary | ICD-10-CM

## 2018-10-25 ENCOUNTER — Telehealth: Payer: Self-pay

## 2018-10-25 NOTE — Telephone Encounter (Signed)
The patient was an urgent referral for abnormal lexiscan, Dr. Rayann Heman (DOD) reviewed and requested the patient be added to DOD early next week. Scheduled with Dr. Irish Lack on 10/28/18. The patient expressed understanding.        COVID-19 Pre-Screening Questions:  . In the past 7 to 10 days have you had a cough,  shortness of breath, headache, congestion, fever (100 or greater) body aches, chills, sore throat, or sudden loss of taste or sense of smell? NO . Have you been around anyone with known Covid 19. NO . Have you been around anyone who is awaiting Covid 19 test results in the past 7 to 10 days? NO . Have you been around anyone who has been exposed to Covid 19, or has mentioned symptoms of Covid 19 within the past 7 to 10 days? NO  If you have any concerns/questions about symptoms patients report during screening (either on the phone or at [facility]). Contact the provider seeing the patient or DOD for further guidance.  If neither are available contact a member of the leadership team.

## 2018-10-27 NOTE — H&P (View-Only) (Signed)
Cardiology Office Note   Date:  10/28/2018   ID:  Jessica [name], DOB 12-Mar-1951, MRN [id number]  PCP:  [name], MD    No chief complaint on file.  Abnormal stress test  Wt Readings from Last 3 Encounters:  10/28/18 244 lb 3.2 oz (110.8 kg)  10/23/18 243 lb (110.2 kg)  10/16/18 243 lb 12.8 oz (110.6 kg)       History of Present Illness: Jessica [name] is a 68 y.o. female  With recent abnormal stress test in 09/2018 as follows:  The left ventricular ejection fraction is normal (55-65%).  Nuclear stress EF: 57%.  There was no ST segment deviation noted during stress.  Defect 1: There is a large defect of moderate severity present in the mid inferoseptal, mid inferior, apical anterior, apical septal, apical inferior, apical lateral and apex location.  This is an intermediate risk study.  No prior study for comparison   Normal LV EF, but fixed large defect in apical region. Cannot exclude ischemia/infarct. TID value borderline, cannot exclude multivessel balanced ischemia.  PMD ordered stress test for an episode of left chest pain in May.  Sx resolved after 10 minutes.    She has had some twinges in her left shoulder but they have reduced in frequency.  Sx have not been associated with walking.    Past Medical History:  Diagnosis Date  . Anxiety   . Arthritis    bilateral knees  . Asthma    childhood  . Autonomic neuropathy    diabetic  . Cataract   . Celiac disease   . Depression   . Diabetes mellitus    type II, Hemoglobin A1C 9.9 10/05/2011  . Diabetic neuropathy ([place])   . Diverticulosis   . Fatty liver   . Gastroparesis   . GERD (gastroesophageal reflux disease)   . Hyperlipidemia   . Hypertension   . IBS (irritable bowel syndrome)   . Personal history of colonic polyps 03/2010   hyperplastic  . Pneumonia 11/03/2015   current    Past Surgical History:  Procedure Laterality Date  . APPENDECTOMY    . CATARACT EXTRACTION Right 04/29/2018   . CATARACT EXTRACTION Left 03/2018  . COLONOSCOPY    . DILATATION & CURRETTAGE/HYSTEROSCOPY WITH RESECTOCOPE N/A 03/24/2013   Procedure: DILATATION & CURETTAGE, HYSTEROSCOPY WITH RESECTION;  Surgeon: Olga Millers, MD;  Location: [facility];  Service: Gynecology;  Laterality: N/A;  . HYSTEROSCOPY W/D&C N/A 11/08/2015   Procedure: DILATATION AND CURETTAGE /HYSTEROSCOPY;  Surgeon: Olga Millers, MD;  Location: [facility];  Service: Gynecology;  Laterality: N/A;  . left breast cyst removal  2000  . TUBAL LIGATION       Current Outpatient Medications  Medication Sig Dispense Refill  . albuterol (PROVENTIL HFA;VENTOLIN HFA) 108 (90 Base) MCG/ACT inhaler Inhale into the lungs every 6 (six) hours as needed for wheezing or shortness of breath.    Marland Kitchen atorvastatin (LIPITOR) 40 MG tablet Take 1 tablet (40 mg total) by mouth daily. 90 tablet 1  . BD INSULIN SYRINGE U/F 31G X 5/16" 1 ML MISC USE UP TO 3 TIMES A DAY    . dexlansoprazole (DEXILANT) 60 MG capsule Take 1 capsule (60 mg total) by mouth daily. 30 capsule 3  . Fluticasone-Salmeterol (ADVAIR DISKUS) 100-50 MCG/DOSE AEPB INHALE 1 PUFF INTO THE LUNGS 2 (TWO) TIMES DAILY. 14 each 1  . gabapentin (NEURONTIN) 600 MG tablet Take one AM, take two qhs 90 tablet 3  .  glucose blood (ONE TOUCH ULTRA TEST) test strip 1 each by Other route 2 (two) times daily. And lancets 2/day 200 each 3  . insulin NPH-regular Human (HUMULIN 70/30) (70-30) 100 UNIT/ML injection INJECT 110 UNITS WITH BREAKFAST AND 50 UNITS WITH EVENING MEAL (Patient taking differently: INJECT 95 UNITS WITH BREAKFAST AND 50 UNITS WITH EVENING MEAL) 60 mL 5  . olmesartan-hydrochlorothiazide (BENICAR HCT) 40-12.5 MG tablet Take 1 tablet by mouth daily. 90 tablet 3  . polyethylene glycol powder ([employer] PURELAX) 17 GM/SCOOP powder DISSOLVE 1 CAPFUL IN AT LEAST 8 OUNCES WATER/JUICE AND DRINK TWICE DAILY 1020 g 2  . valACYclovir (VALTREX) 500 MG tablet TAKE 1 TABLET BY MOUTH EVERY DAY 60 tablet 3   No  current facility-administered medications for this visit.     Allergies:   Gluten meal and Nsaids    Social History:  The patient  reports that she has never smoked. She has never used smokeless tobacco. She reports that she does not drink alcohol or use drugs.   Family History:  The patient's *family history includes COPD in her father; Colon cancer in her sister; Diabetes in her father and sister; Endometrial cancer in her sister; Heart attack in her father; Heart failure in her father; Hypertension in her brother and mother; Other in her father.    ROS:  Please see the history of present illness.   Otherwise, review of systems are positive for left shoulder pain.   All other systems are reviewed and negative.    PHYSICAL EXAM: VS:  BP 132/60   Pulse 95   Ht 5' 6"  (1.676 m)   Wt 244 lb 3.2 oz (110.8 kg)   SpO2 97%   BMI 39.41 kg/m  , BMI Body mass index is 39.41 kg/m. GEN: Well nourished, well developed, in no acute distress  HEENT: normal  Neck: no JVD, carotid bruits, or masses Cardiac: RRR; no murmurs, rubs, or gallops,no edema  Respiratory:  clear to auscultation bilaterally, normal work of breathing GI: soft, nontender, nondistended, + BS MS: no deformity or atrophy ; 2+ right radial pulse Skin: warm and dry, no rash Neuro:  Strength and sensation are intact Psych: euthymic mood, full affect   EKG:   The ekg ordered today demonstrates NSR, PRWP   Recent Labs: 10/16/2018: ALT 12; BUN 20; Creatinine, Ser 1.03; Hemoglobin 13.5; Platelets 227.0; Potassium 4.4; Sodium 137; TSH 2.15   Lipid Panel    Component Value Date/Time   CHOL 134 10/16/2018 0932   TRIG 64.0 10/16/2018 0932   HDL 47.40 10/16/2018 0932   CHOLHDL 3 10/16/2018 0932   VLDL 12.8 10/16/2018 0932   LDLCALC 74 10/16/2018 0932   LDLDIRECT 143.2 11/23/2006 1130     Other studies Reviewed: Additional studies/ records that were reviewed today with results demonstrating:stress test result.    ASSESSMENT AND PLAN:  1. Abnormal stress test:   Sx are somewhat atypical, but are frequent.  SHoulder pain, nausea, GERD.  It is making her anxious as well.  We will plan for cath.  2. DM: Biggest RF for CAD.  3. Hyperlipidemia: The current medical regimen is effective;  continue present plan and medications. Cardiac catheterization was discussed with the patient fully. The patient understands that risks include but are not limited to stroke (1 in 1000), death (1 in 70), kidney failure [usually temporary] (1 in 500), bleeding (1 in 200), allergic reaction [possibly serious] (1 in 200).  The patient understands and is willing to proceed.  Current medicines are reviewed at length with the patient today.  The patient concerns regarding her medicines were addressed.  The following changes have been made:  No change  Labs/ tests ordered today include:  No orders of the defined types were placed in this encounter.   Recommend 150 minutes/week of aerobic exercise Low fat, low carb, high fiber diet recommended  Disposition:   FU for cath   Signed, Larae Grooms, MD  10/28/2018 11:11 AM    [address], [place], [place]  [address] Phone: [phone]; Fax: [phone]

## 2018-10-27 NOTE — Progress Notes (Signed)
Cardiology Office Note   Date:  10/28/2018   ID:  Jessica [name], DOB 17-Jun-1950, MRN [id number]  PCP:  [name], MD    No chief complaint on file.  Abnormal stress test  Wt Readings from Last 3 Encounters:  10/28/18 244 lb 3.2 oz (110.8 kg)  10/23/18 243 lb (110.2 kg)  10/16/18 243 lb 12.8 oz (110.6 kg)       History of Present Illness: Jessica [name] is a 68 y.o. female  With recent abnormal stress test in 09/2018 as follows:  The left ventricular ejection fraction is normal (55-65%).  Nuclear stress EF: 57%.  There was no ST segment deviation noted during stress.  Defect 1: There is a large defect of moderate severity present in the mid inferoseptal, mid inferior, apical anterior, apical septal, apical inferior, apical lateral and apex location.  This is an intermediate risk study.  No prior study for comparison   Normal LV EF, but fixed large defect in apical region. Cannot exclude ischemia/infarct. TID value borderline, cannot exclude multivessel balanced ischemia.  PMD ordered stress test for an episode of left chest pain in May.  Sx resolved after 10 minutes.    She has had some twinges in her left shoulder but they have reduced in frequency.  Sx have not been associated with walking.    Past Medical History:  Diagnosis Date  . Anxiety   . Arthritis    bilateral knees  . Asthma    childhood  . Autonomic neuropathy    diabetic  . Cataract   . Celiac disease   . Depression   . Diabetes mellitus    type II, Hemoglobin A1C 9.9 10/05/2011  . Diabetic neuropathy ([place])   . Diverticulosis   . Fatty liver   . Gastroparesis   . GERD (gastroesophageal reflux disease)   . Hyperlipidemia   . Hypertension   . IBS (irritable bowel syndrome)   . Personal history of colonic polyps 03/2010   hyperplastic  . Pneumonia 11/03/2015   current    Past Surgical History:  Procedure Laterality Date  . APPENDECTOMY    . CATARACT EXTRACTION Right 04/29/2018   . CATARACT EXTRACTION Left 03/2018  . COLONOSCOPY    . DILATATION & CURRETTAGE/HYSTEROSCOPY WITH RESECTOCOPE N/A 03/24/2013   Procedure: DILATATION & CURETTAGE, HYSTEROSCOPY WITH RESECTION;  Surgeon: Olga Millers, MD;  Location: [facility];  Service: Gynecology;  Laterality: N/A;  . HYSTEROSCOPY W/D&C N/A 11/08/2015   Procedure: DILATATION AND CURETTAGE /HYSTEROSCOPY;  Surgeon: Olga Millers, MD;  Location: [facility];  Service: Gynecology;  Laterality: N/A;  . left breast cyst removal  2000  . TUBAL LIGATION       Current Outpatient Medications  Medication Sig Dispense Refill  . albuterol (PROVENTIL HFA;VENTOLIN HFA) 108 (90 Base) MCG/ACT inhaler Inhale into the lungs every 6 (six) hours as needed for wheezing or shortness of breath.    Marland Kitchen atorvastatin (LIPITOR) 40 MG tablet Take 1 tablet (40 mg total) by mouth daily. 90 tablet 1  . BD INSULIN SYRINGE U/F 31G X 5/16" 1 ML MISC USE UP TO 3 TIMES A DAY    . dexlansoprazole (DEXILANT) 60 MG capsule Take 1 capsule (60 mg total) by mouth daily. 30 capsule 3  . Fluticasone-Salmeterol (ADVAIR DISKUS) 100-50 MCG/DOSE AEPB INHALE 1 PUFF INTO THE LUNGS 2 (TWO) TIMES DAILY. 14 each 1  . gabapentin (NEURONTIN) 600 MG tablet Take one AM, take two qhs 90 tablet 3  .  glucose blood (ONE TOUCH ULTRA TEST) test strip 1 each by Other route 2 (two) times daily. And lancets 2/day 200 each 3  . insulin NPH-regular Human (HUMULIN 70/30) (70-30) 100 UNIT/ML injection INJECT 110 UNITS WITH BREAKFAST AND 50 UNITS WITH EVENING MEAL (Patient taking differently: INJECT 95 UNITS WITH BREAKFAST AND 50 UNITS WITH EVENING MEAL) 60 mL 5  . olmesartan-hydrochlorothiazide (BENICAR HCT) 40-12.5 MG tablet Take 1 tablet by mouth daily. 90 tablet 3  . polyethylene glycol powder ([employer] PURELAX) 17 GM/SCOOP powder DISSOLVE 1 CAPFUL IN AT LEAST 8 OUNCES WATER/JUICE AND DRINK TWICE DAILY 1020 g 2  . valACYclovir (VALTREX) 500 MG tablet TAKE 1 TABLET BY MOUTH EVERY DAY 60 tablet 3   No  current facility-administered medications for this visit.     Allergies:   Gluten meal and Nsaids    Social History:  The patient  reports that she has never smoked. She has never used smokeless tobacco. She reports that she does not drink alcohol or use drugs.   Family History:  The patient's *family history includes COPD in her father; Colon cancer in her sister; Diabetes in her father and sister; Endometrial cancer in her sister; Heart attack in her father; Heart failure in her father; Hypertension in her brother and mother; Other in her father.    ROS:  Please see the history of present illness.   Otherwise, review of systems are positive for left shoulder pain.   All other systems are reviewed and negative.    PHYSICAL EXAM: VS:  BP 132/60   Pulse 95   Ht 5' 6"  (1.676 m)   Wt 244 lb 3.2 oz (110.8 kg)   SpO2 97%   BMI 39.41 kg/m  , BMI Body mass index is 39.41 kg/m. GEN: Well nourished, well developed, in no acute distress  HEENT: normal  Neck: no JVD, carotid bruits, or masses Cardiac: RRR; no murmurs, rubs, or gallops,no edema  Respiratory:  clear to auscultation bilaterally, normal work of breathing GI: soft, nontender, nondistended, + BS MS: no deformity or atrophy ; 2+ right radial pulse Skin: warm and dry, no rash Neuro:  Strength and sensation are intact Psych: euthymic mood, full affect   EKG:   The ekg ordered today demonstrates NSR, PRWP   Recent Labs: 10/16/2018: ALT 12; BUN 20; Creatinine, Ser 1.03; Hemoglobin 13.5; Platelets 227.0; Potassium 4.4; Sodium 137; TSH 2.15   Lipid Panel    Component Value Date/Time   CHOL 134 10/16/2018 0932   TRIG 64.0 10/16/2018 0932   HDL 47.40 10/16/2018 0932   CHOLHDL 3 10/16/2018 0932   VLDL 12.8 10/16/2018 0932   LDLCALC 74 10/16/2018 0932   LDLDIRECT 143.2 11/23/2006 1130     Other studies Reviewed: Additional studies/ records that were reviewed today with results demonstrating:stress test result.    ASSESSMENT AND PLAN:  1. Abnormal stress test:   Sx are somewhat atypical, but are frequent.  SHoulder pain, nausea, GERD.  It is making her anxious as well.  We will plan for cath.  2. DM: Biggest RF for CAD.  3. Hyperlipidemia: The current medical regimen is effective;  continue present plan and medications. Cardiac catheterization was discussed with the patient fully. The patient understands that risks include but are not limited to stroke (1 in 1000), death (1 in 45), kidney failure [usually temporary] (1 in 500), bleeding (1 in 200), allergic reaction [possibly serious] (1 in 200).  The patient understands and is willing to proceed.  Current medicines are reviewed at length with the patient today.  The patient concerns regarding her medicines were addressed.  The following changes have been made:  No change  Labs/ tests ordered today include:  No orders of the defined types were placed in this encounter.   Recommend 150 minutes/week of aerobic exercise Low fat, low carb, high fiber diet recommended  Disposition:   FU for cath   Signed, Larae Grooms, MD  10/28/2018 11:11 AM    [address] [place], [place], [place]  [address] Phone: [phone]; Fax: [phone]

## 2018-10-28 ENCOUNTER — Ambulatory Visit (INDEPENDENT_AMBULATORY_CARE_PROVIDER_SITE_OTHER): Payer: Medicare Other | Admitting: Interventional Cardiology

## 2018-10-28 ENCOUNTER — Other Ambulatory Visit (HOSPITAL_COMMUNITY)
Admission: RE | Admit: 2018-10-28 | Discharge: 2018-10-28 | Disposition: A | Discharge status: Home / Self Care | Payer: Medicare Other | Source: Ambulatory Visit | Attending: Interventional Cardiology | Admitting: Interventional Cardiology

## 2018-10-28 ENCOUNTER — Other Ambulatory Visit: Payer: Self-pay

## 2018-10-28 ENCOUNTER — Encounter: Payer: Self-pay | Admitting: Interventional Cardiology

## 2018-10-28 VITALS — BP 132/60 | HR 95 | Ht 66.0 in | Wt 244.2 lb

## 2018-10-28 DIAGNOSIS — R072 Precordial pain: Secondary | ICD-10-CM

## 2018-10-28 DIAGNOSIS — Z01812 Encounter for preprocedural laboratory examination: Secondary | ICD-10-CM | POA: Diagnosis not present

## 2018-10-28 DIAGNOSIS — Z1159 Encounter for screening for other viral diseases: Secondary | ICD-10-CM | POA: Diagnosis not present

## 2018-10-28 DIAGNOSIS — R9439 Abnormal result of other cardiovascular function study: Secondary | ICD-10-CM

## 2018-10-28 LAB — BASIC METABOLIC PANEL
BUN/Creatinine Ratio: 21 (ref 12–28)
BUN: 22 mg/dL (ref 8–27)
CO2: 22 mmol/L (ref 20–29)
Calcium: 9.7 mg/dL (ref 8.7–10.3)
Chloride: 100 mmol/L (ref 96–106)
Creatinine, Ser: 1.06 mg/dL — ABNORMAL HIGH (ref 0.57–1.00)
GFR calc Af Amer: 62 mL/min/{1.73_m2} (ref >59)
GFR calc non Af Amer: 54 mL/min/{1.73_m2} — ABNORMAL LOW (ref >59)
Glucose: 174 mg/dL — ABNORMAL HIGH (ref 65–99)
Potassium: 4.5 mmol/L (ref 3.5–5.2)
Sodium: 137 mmol/L (ref 134–144)

## 2018-10-28 LAB — CBC
Hematocrit: 40.4 % (ref 34.0–46.6)
Hemoglobin: 13.4 g/dL (ref 11.1–15.9)
MCH: 32.1 pg (ref 26.6–33.0)
MCHC: 33.2 g/dL (ref 31.5–35.7)
MCV: 97 fL (ref 79–97)
Platelets: 260 10*3/uL (ref 150–450)
RBC: 4.17 x10E6/uL (ref 3.77–5.28)
RDW: 12 % (ref 11.7–15.4)
WBC: 4.8 10*3/uL (ref 3.4–10.8)

## 2018-10-28 LAB — SARS CORONAVIRUS 2 (TAT 6-24 HRS): SARS Coronavirus 2: NEGATIVE

## 2018-10-28 NOTE — Patient Instructions (Addendum)
Medication Instructions:  Your physician recommends that you continue on your current medications as directed. Please refer to the Current Medication list given to you today.  If you need a refill on your cardiac medications before your next appointment, please call your pharmacy.   Lab work:  Today: CBC and BMET  If you have labs (blood work) drawn today and your tests are completely normal, you will receive your results only by: Marland Kitchen MyChart Message (if you have MyChart) OR . A paper copy in the mail If you have any lab test that is abnormal or we need to change your treatment, we will call you to review the results.  Testing/Procedures: Your physician has requested that you have a cardiac catheterization. Cardiac catheterization is used to diagnose and/or treat various heart conditions. Doctors may recommend this procedure for a number of different reasons. The most common reason is to evaluate chest pain. Chest pain can be a symptom of coronary artery disease (CAD), and cardiac catheterization can show whether plaque is narrowing or blocking your heart's arteries. This procedure is also used to evaluate the valves, as well as measure the blood flow and oxygen levels in different parts of your heart. For further information please visit [email or web address]. Please follow instruction sheet, as given.    Follow-Up:  11/13/18 at 11:30AM with Ermalinda Barrios, PA  Any Other Special Instructions Will Be Listed Below (If Applicable).     [facility] [place], [place] [address] Dept: [phone] Loc: [place]  10/28/2018  You are scheduled for a Cardiac Catheterization on Thursday, July 2 with Dr. Larae Grooms.  1. Please arrive at the [facility] (Main Entrance A) at [facility]: [address] [place], [place] [address] at 8:30 AM (This time is two  hours before your procedure to ensure your preparation). Free valet parking service is available.   Special note: Every effort is made to have your procedure done on time. Please understand that emergencies sometimes delay scheduled procedures.  2. Diet: Do not eat solid foods after midnight.  The patient may have clear liquids until 5am upon the day of the procedure.  3. Labs: You will need to have blood drawn today. Your Pre-procedure COVID-19 Testing will be done on 3PM today at [facility] at [address] [place], [place] [address] After your swab you will be given a mask to wear and instructed to go home and quarantine/no visitors until after your procedure. If you test positive you will be notified and your procedure will be cancelled.    4. Medication instructions in preparation for your procedure:   Contrast Allergy: No  Do not take Olmesartan-hydrochlorothiazide the morning of the procedure.   Do not take insulin the morning of the procedure.  On the morning of your procedure, take Aspirin 81MG and any morning medicines NOT listed above.  You may use sips of water.  5. Plan for one night stay--bring personal belongings. 6. Bring a current list of your medications and current insurance cards. 7. You MUST have a responsible person to drive you home. 8. Someone MUST be with you the first 24 hours after you arrive home or your discharge will be delayed. 9. Please wear clothes that are easy to get on and off and wear slip-on shoes.  Thank you for allowing [place] to care for you!   --  [facility]

## 2018-10-29 ENCOUNTER — Other Ambulatory Visit: Payer: Self-pay | Admitting: *Deleted

## 2018-10-29 MED ORDER — FLUTICASONE-SALMETEROL 100-50 MCG/DOSE IN AEPB: 1.0000 | INHALATION_SPRAY | Freq: Two times a day (BID) | RESPIRATORY_TRACT | 3 refills | Status: DC | PRN

## 2018-10-30 ENCOUNTER — Ambulatory Visit: Payer: Medicare Other | Admitting: Endocrinology

## 2018-10-30 ENCOUNTER — Telehealth: Payer: Self-pay | Admitting: *Deleted

## 2018-10-30 NOTE — Telephone Encounter (Addendum)
Pt contacted pre-catheterization scheduled at [facility] for: Thursday October 31, 2018 10:30 AM Verified arrival time and place: [place] Entrance A at: 8:30 AM  Covid-19 test date: 10/28/18  No solid food after midnight prior to cath, clear liquids until 5 AM day of procedure. Contrast allergy: no  Hold: Olmesartan-HCT-AM of procedure. Insulin-AM of procedure.  Except hold medications AM meds can be  taken pre-cath with sip of water including: ASA 81 mg   Confirmed patient has responsible person to drive home post procedure and observe 24 hours after arriving home: yes  Due to Covid-19 pandemic no visitors are allowed in the hospital (unless cognitive impairment).  Their designated party will be called when their procedure is over for an update and to arrange pick up.  Patients are required to wear a mask when they enter the hospital.        COVID-19 Pre-Screening Questions:  . In the past 7 to 10 days have you had a cough,  shortness of breath, headache, congestion, fever (100 or greater) body aches, chills, sore throat, or sudden loss of taste or sense of smell? no . Have you been around anyone with known Covid 19? no . Have you been around anyone who is awaiting Covid 19 test results in the past 7 to 10 days? no . Have you been around anyone who has been exposed to Covid 19, or has mentioned symptoms of Covid 19 within the past 7 to 10 days? no  I reviewed procedure instructions/mask/visitor/Covid-19 screening questions with patient, she verbalized understanding, thanked me for call.

## 2018-10-31 ENCOUNTER — Ambulatory Visit (HOSPITAL_COMMUNITY)
Admission: RE | Disposition: A | Discharge status: Home / Self Care | Payer: Self-pay | Source: Home / Self Care | Attending: Interventional Cardiology

## 2018-10-31 ENCOUNTER — Ambulatory Visit (HOSPITAL_COMMUNITY)
Admission: RE | Admit: 2018-10-31 | Discharge: 2018-10-31 | Disposition: A | Discharge status: Home / Self Care | Payer: Medicare Other | Attending: Interventional Cardiology | Admitting: Interventional Cardiology

## 2018-10-31 ENCOUNTER — Other Ambulatory Visit: Payer: Self-pay

## 2018-10-31 ENCOUNTER — Encounter (HOSPITAL_COMMUNITY): Payer: Self-pay | Admitting: Interventional Cardiology

## 2018-10-31 DIAGNOSIS — Z886 Allergy status to analgesic agent status: Secondary | ICD-10-CM | POA: Insufficient documentation

## 2018-10-31 DIAGNOSIS — E1143 Type 2 diabetes mellitus with diabetic autonomic (poly)neuropathy: Secondary | ICD-10-CM | POA: Insufficient documentation

## 2018-10-31 DIAGNOSIS — K589 Irritable bowel syndrome without diarrhea: Secondary | ICD-10-CM | POA: Diagnosis not present

## 2018-10-31 DIAGNOSIS — Z825 Family history of asthma and other chronic lower respiratory diseases: Secondary | ICD-10-CM | POA: Diagnosis not present

## 2018-10-31 DIAGNOSIS — K3184 Gastroparesis: Secondary | ICD-10-CM | POA: Diagnosis not present

## 2018-10-31 DIAGNOSIS — E785 Hyperlipidemia, unspecified: Secondary | ICD-10-CM | POA: Diagnosis not present

## 2018-10-31 DIAGNOSIS — I10 Essential (primary) hypertension: Secondary | ICD-10-CM | POA: Insufficient documentation

## 2018-10-31 DIAGNOSIS — I2510 Atherosclerotic heart disease of native coronary artery without angina pectoris: Secondary | ICD-10-CM | POA: Insufficient documentation

## 2018-10-31 DIAGNOSIS — Z8249 Family history of ischemic heart disease and other diseases of the circulatory system: Secondary | ICD-10-CM | POA: Diagnosis not present

## 2018-10-31 DIAGNOSIS — R9439 Abnormal result of other cardiovascular function study: Secondary | ICD-10-CM | POA: Insufficient documentation

## 2018-10-31 DIAGNOSIS — Z79899 Other long term (current) drug therapy: Secondary | ICD-10-CM | POA: Insufficient documentation

## 2018-10-31 DIAGNOSIS — Z794 Long term (current) use of insulin: Secondary | ICD-10-CM | POA: Diagnosis not present

## 2018-10-31 DIAGNOSIS — J45909 Unspecified asthma, uncomplicated: Secondary | ICD-10-CM | POA: Diagnosis not present

## 2018-10-31 DIAGNOSIS — Z9851 Tubal ligation status: Secondary | ICD-10-CM | POA: Insufficient documentation

## 2018-10-31 DIAGNOSIS — K760 Fatty (change of) liver, not elsewhere classified: Secondary | ICD-10-CM | POA: Diagnosis not present

## 2018-10-31 DIAGNOSIS — K219 Gastro-esophageal reflux disease without esophagitis: Secondary | ICD-10-CM | POA: Insufficient documentation

## 2018-10-31 DIAGNOSIS — K900 Celiac disease: Secondary | ICD-10-CM | POA: Diagnosis not present

## 2018-10-31 DIAGNOSIS — Z7951 Long term (current) use of inhaled steroids: Secondary | ICD-10-CM | POA: Diagnosis not present

## 2018-10-31 DIAGNOSIS — M1990 Unspecified osteoarthritis, unspecified site: Secondary | ICD-10-CM | POA: Insufficient documentation

## 2018-10-31 DIAGNOSIS — Z833 Family history of diabetes mellitus: Secondary | ICD-10-CM | POA: Diagnosis not present

## 2018-10-31 HISTORY — PX: LEFT HEART CATH AND CORONARY ANGIOGRAPHY: CATH118249

## 2018-10-31 LAB — GLUCOSE, CAPILLARY: Glucose-Capillary: 127 mg/dL — ABNORMAL HIGH (ref 70–99)

## 2018-10-31 SURGERY — LEFT HEART CATH AND CORONARY ANGIOGRAPHY
Anesthesia: LOCAL

## 2018-10-31 MED ORDER — VERAPAMIL HCL 2.5 MG/ML IV SOLN
INTRAVENOUS | Status: AC
  Filled 2018-10-31: qty 2

## 2018-10-31 MED ORDER — LIDOCAINE HCL (PF) 1 % IJ SOLN
INTRAMUSCULAR | Status: AC
  Filled 2018-10-31: qty 30

## 2018-10-31 MED ORDER — HEPARIN (PORCINE) IN NACL 1000-0.9 UT/500ML-% IV SOLN
INTRAVENOUS | Status: AC
  Filled 2018-10-31: qty 1000

## 2018-10-31 MED ORDER — SODIUM CHLORIDE 0.9% FLUSH: 3.0000 mL | Freq: Two times a day (BID) | INTRAVENOUS | Status: DC

## 2018-10-31 MED ORDER — MIDAZOLAM HCL 2 MG/2ML IJ SOLN
INTRAMUSCULAR | Status: DC | PRN
  Administered 2018-10-31: 2 mg via INTRAVENOUS

## 2018-10-31 MED ORDER — LABETALOL HCL 5 MG/ML IV SOLN: 10.0000 mg | INTRAVENOUS | Status: DC | PRN

## 2018-10-31 MED ORDER — FENTANYL CITRATE (PF) 100 MCG/2ML IJ SOLN
INTRAMUSCULAR | Status: DC | PRN
  Administered 2018-10-31: 50 ug via INTRAVENOUS

## 2018-10-31 MED ORDER — HEPARIN SODIUM (PORCINE) 1000 UNIT/ML IJ SOLN
INTRAMUSCULAR | Status: DC | PRN
  Administered 2018-10-31: 6000 [IU] via INTRAVENOUS

## 2018-10-31 MED ORDER — ACETAMINOPHEN 325 MG PO TABS: 650.0000 mg | ORAL_TABLET | ORAL | Status: DC | PRN

## 2018-10-31 MED ORDER — SODIUM CHLORIDE 0.9 % IV SOLN: INTRAVENOUS | Status: DC

## 2018-10-31 MED ORDER — IOHEXOL 350 MG/ML SOLN
INTRAVENOUS | Status: DC | PRN
  Administered 2018-10-31: 35 mL via INTRAVENOUS

## 2018-10-31 MED ORDER — ONDANSETRON HCL 4 MG/2ML IJ SOLN: 4.0000 mg | Freq: Four times a day (QID) | INTRAMUSCULAR | Status: DC | PRN

## 2018-10-31 MED ORDER — HYDRALAZINE HCL 20 MG/ML IJ SOLN: 10.0000 mg | INTRAMUSCULAR | Status: DC | PRN

## 2018-10-31 MED ORDER — ASPIRIN 81 MG PO CHEW: 81.0000 mg | CHEWABLE_TABLET | ORAL | Status: DC

## 2018-10-31 MED ORDER — SODIUM CHLORIDE 0.9 % IV SOLN: 250.0000 mL | INTRAVENOUS | Status: DC | PRN

## 2018-10-31 MED ORDER — SODIUM CHLORIDE 0.9 % WEIGHT BASED INFUSION: 1.0000 mL/kg/h | INTRAVENOUS | Status: DC

## 2018-10-31 MED ORDER — LIDOCAINE HCL (PF) 1 % IJ SOLN
INTRAMUSCULAR | Status: DC | PRN
  Administered 2018-10-31: 3 mL

## 2018-10-31 MED ORDER — SODIUM CHLORIDE 0.9% FLUSH: 3.0000 mL | INTRAVENOUS | Status: DC | PRN

## 2018-10-31 MED ORDER — SODIUM CHLORIDE 0.9 % WEIGHT BASED INFUSION
3.0000 mL/kg/h | INTRAVENOUS | Status: AC
  Administered 2018-10-31: 3 mL/kg/h via INTRAVENOUS

## 2018-10-31 MED ORDER — FENTANYL CITRATE (PF) 100 MCG/2ML IJ SOLN
INTRAMUSCULAR | Status: AC
  Filled 2018-10-31: qty 2

## 2018-10-31 MED ORDER — VERAPAMIL HCL 2.5 MG/ML IV SOLN
INTRAVENOUS | Status: DC | PRN
  Administered 2018-10-31: 10 mL via INTRA_ARTERIAL

## 2018-10-31 MED ORDER — MIDAZOLAM HCL 2 MG/2ML IJ SOLN
INTRAMUSCULAR | Status: AC
  Filled 2018-10-31: qty 2

## 2018-10-31 MED ORDER — HEPARIN (PORCINE) IN NACL 1000-0.9 UT/500ML-% IV SOLN
INTRAVENOUS | Status: DC | PRN
  Administered 2018-10-31 (×2): 500 mL

## 2018-10-31 SURGICAL SUPPLY — 9 items

## 2018-10-31 NOTE — Discharge Instructions (Signed)
Moderate Conscious Sedation, Adult, Care After These instructions provide you with information about caring for yourself after your procedure. Your health care provider may also give you more specific instructions. Your treatment has been planned according to current medical practices, but problems sometimes occur. Call your health care provider if you have any problems or questions after your procedure. What can I expect after the procedure? After your procedure, it is common:  To feel sleepy for several hours.  To feel clumsy and have poor balance for several hours.  To have poor judgment for several hours.  To vomit if you eat too soon. Follow these instructions at home: For at least 24 hours after the procedure:   Do not: ? Participate in activities where you could fall or become injured. ? Drive. ? Use heavy machinery. ? Drink alcohol. ? Take sleeping pills or medicines that cause drowsiness. ? Make important decisions or sign legal documents. ? Take care of children on your own.  Rest. Eating and drinking  Follow the diet recommended by your health care provider.  If you vomit: ? Drink water, juice, or soup when you can drink without vomiting. ? Make sure you have little or no nausea before eating solid foods. General instructions  Have a responsible adult stay with you until you are awake and alert.  Take over-the-counter and prescription medicines only as told by your health care provider.  If you smoke, do not smoke without supervision.  Keep all follow-up visits as told by your health care provider. This is important. Contact a health care provider if:  You keep feeling nauseous or you keep vomiting.  You feel light-headed.  You develop a rash.  You have a fever. Get help right away if:  You have trouble breathing. This information is not intended to replace advice given to you by your health care provider. Make sure you discuss any questions you have  with your health care provider. Document Released: 02/05/2013 Document Revised: 03/30/2017 Document Reviewed: 08/07/2015 Elsevier Patient Education  2020 [place] LEVEL OF HEART Radial Site Care   This sheet gives you information about how to care for yourself after your procedure. Your health care provider may also give you more specific instructions. If you have problems or questions, contact your health care provider. What can I expect after the procedure? After the procedure, it is common to have:  Bruising and tenderness at the catheter insertion area. Follow these instructions at home: Medicines  Take over-the-counter and prescription medicines only as told by your health care provider. Insertion site care  Follow instructions from your health care provider about how to take care of your insertion site. Make sure you: ? Wash your hands with soap and water before you change your bandage (dressing). If soap and water are not available, use hand sanitizer. ? Change your dressing as told by your health care provider. ? Leave stitches (sutures), skin glue, or adhesive strips in place. These skin closures may need to stay in place for 2 weeks or longer. If adhesive strip edges start to loosen and curl up, you may trim the loose edges. Do not remove adhesive strips completely unless your health care provider tells you to do that.  Check your insertion site every day for signs of infection. Check for: ? Redness, swelling, or pain. ? Fluid or blood. ? Pus or a bad smell. ? Warmth.  Do not take baths, swim, or use a hot  tub until your health care provider approves.  You may shower 24-48 hours after the procedure, or as directed by your health care provider. ? Remove the dressing and gently wash the site with plain soap and water. ? Pat the area dry with a clean towel. ? Do not rub the site. That could cause bleeding.  Do not apply powder or lotion  to the site. Activity   For 24 hours after the procedure, or as directed by your health care provider: ? Do not flex or bend the affected arm. ? Do not push or pull heavy objects with the affected arm. ? Do not drive yourself home from the hospital or clinic. You may drive 24 hours after the procedure unless your health care provider tells you not to. ? Do not operate machinery or power tools.  Do not lift anything that is heavier than 10 lb (4.5 kg), or the limit that you are told, until your health care provider says that it is safe.  Ask your health care provider when it is okay to: ? Return to work or school. ? Resume usual physical activities or sports. ? Resume sexual activity. General instructions  If the catheter site starts to bleed, raise your arm and put firm pressure on the site. If the bleeding does not stop, get help right away. This is a medical emergency.  If you went home on the same day as your procedure, a responsible adult should be with you for the first 24 hours after you arrive home.  Keep all follow-up visits as told by your health care provider. This is important. Contact a health care provider if:  You have a fever.  You have redness, swelling, or yellow drainage around your insertion site. Get help right away if:  You have unusual pain at the radial site.  The catheter insertion area swells very fast.  The insertion area is bleeding, and the bleeding does not stop when you hold steady pressure on the area.  Your arm or hand becomes pale, cool, tingly, or numb. These symptoms may represent a serious problem that is an emergency. Do not wait to see if the symptoms will go away. Get medical help right away. Call your local emergency services (911 in the [place] Do not drive yourself to the hospital. Summary  After the procedure, it is common to have bruising and tenderness at the site.  Follow instructions from your health care provider about how to  take care of your radial site wound. Check the wound every day for signs of infection.  Do not lift anything that is heavier than 10 lb (4.5 kg), or the limit that you are told, until your health care provider says that it is safe. This information is not intended to replace advice given to you by your health care provider. Make sure you discuss any questions you have with your health care provider. Document Released: 05/20/2010 Document Revised: 05/23/2017 Document Reviewed: 05/23/2017 Elsevier Patient Education  2020 [employer].

## 2018-10-31 NOTE — Interval H&P Note (Signed)
Cath Lab Visit (complete for each Cath Lab visit)  Clinical Evaluation Leading to the Procedure:   ACS: No.  Non-ACS:    Anginal Classification: CCS III  Anti-ischemic medical therapy: Minimal Therapy (1 class of medications)  Non-Invasive Test Results: Intermediate-risk stress test findings: cardiac mortality 1-3%/year  Prior CABG: No previous CABG      History and Physical Interval Note:  10/31/2018 10:21 AM  Jessica [name]  has presented today for surgery, with the diagnosis of abnormal stress test.  The various methods of treatment have been discussed with the patient and family. After consideration of risks, benefits and other options for treatment, the patient has consented to  Procedure(s): LEFT HEART CATH AND CORONARY ANGIOGRAPHY (N/A) as a surgical intervention.  The patient's history has been reviewed, patient examined, no change in status, stable for surgery.  I have reviewed the patient's chart and labs.  Questions were answered to the patient's satisfaction.     Larae Grooms

## 2018-11-04 ENCOUNTER — Telehealth: Payer: Self-pay | Admitting: Internal Medicine

## 2018-11-04 MED FILL — Verapamil HCl IV Soln 2.5 MG/ML: INTRAVENOUS | Qty: 2 | Status: AC

## 2018-11-04 NOTE — Telephone Encounter (Signed)
Pt reported that she is still experiencing GERD and nausea.  Please advise.

## 2018-11-04 NOTE — Telephone Encounter (Signed)
Pt states she was having issues with indigestion. She had a heart cath last Thursday but her arteries were clear. States she is still having Nausea, pain and indigestion. She is taking Dexilant 23m 347m prior to breakfast. The only thing different is that her gabepentin has been increased. Pt not sure what to do regarding her symptoms. Please advise.

## 2018-11-05 MED ORDER — SUCRALFATE 1 GM/10ML PO SUSP: 1.0000 g | Freq: Four times a day (QID) | ORAL | 1 refills | Status: DC

## 2018-11-05 NOTE — Telephone Encounter (Signed)
Spoke with pt and she is aware. Script sent to pharmacy, pt scheduled to see Dr. Hilarie Fredrickson 12/24/18@8 :30am.

## 2018-11-05 NOTE — Telephone Encounter (Signed)
Would add liquid sucralfate 1 g 3 times daily before meals and at bedtime for 1 to 2 weeks to see if this improves symptoms Would also recommend office follow-up with me or next available APP We need to discuss upper endoscopy versus gastric emptying scan depending on symptoms Continue Dexilant 60 mg every morning

## 2018-11-08 ENCOUNTER — Ambulatory Visit: Payer: Medicare Other | Admitting: Cardiology

## 2018-11-09 ENCOUNTER — Other Ambulatory Visit: Payer: Self-pay | Admitting: Endocrinology

## 2018-11-10 NOTE — Telephone Encounter (Signed)
Please forward refill request to pt's new primary care provider.  

## 2018-11-11 NOTE — Telephone Encounter (Signed)
At Dr. Cordelia Pen request, I am forwarding you this refill request. Please review and approve if appropriate

## 2018-11-12 ENCOUNTER — Telehealth: Payer: Self-pay | Admitting: Physician Assistant

## 2018-11-12 NOTE — Progress Notes (Signed)
Cardiology Office Note    Date:  11/13/2018   ID:  Jessica [name], DOB July 27, 1950, MRN [id number]  PCP:  [name], MD  Cardiologist: Larae Grooms, MD EPS: None  Chief Complaint  Patient presents with  . Hospitalization Follow-up    History of Present Illness:  Jessica [name] is a 68 y.o. female with history of DM and HLD who had chest pain 08/2018 and had an abnormal NST 10/23/18 normal LVEF 57% with large defect in apical region. Cardiac cath showed minimal CAD with 10% LAD EF 55-65%.  Complains of fatigue. Saw GI for heartburn and now is doing better. Still has an occasional twinge in left chest that comes and goes.Struggles with diet and weight loss.  Past Medical History:  Diagnosis Date  . Anxiety   . Arthritis    bilateral knees  . Asthma    childhood  . Autonomic neuropathy    diabetic  . Cataract   . Celiac disease   . Depression   . Diabetes mellitus    type II, Hemoglobin A1C 9.9 10/05/2011  . Diabetic neuropathy ([place])   . Diverticulosis   . Fatty liver   . Gastroparesis   . GERD (gastroesophageal reflux disease)   . Hyperlipidemia   . Hypertension   . IBS (irritable bowel syndrome)   . Personal history of colonic polyps 03/2010   hyperplastic  . Pneumonia 11/03/2015   current    Past Surgical History:  Procedure Laterality Date  . APPENDECTOMY    . CATARACT EXTRACTION Right 04/29/2018  . CATARACT EXTRACTION Left 03/2018  . COLONOSCOPY    . DILATATION & CURRETTAGE/HYSTEROSCOPY WITH RESECTOCOPE N/A 03/24/2013   Procedure: DILATATION & CURETTAGE, HYSTEROSCOPY WITH RESECTION;  Surgeon: Olga Millers, MD;  Location: [facility];  Service: Gynecology;  Laterality: N/A;  . HYSTEROSCOPY W/D&C N/A 11/08/2015   Procedure: DILATATION AND CURETTAGE /HYSTEROSCOPY;  Surgeon: Olga Millers, MD;  Location: [facility];  Service: Gynecology;  Laterality: N/A;  . left breast cyst removal  2000  . LEFT HEART CATH AND CORONARY ANGIOGRAPHY N/A 10/31/2018   Procedure: LEFT HEART CATH AND CORONARY ANGIOGRAPHY;  Surgeon: Jettie Booze, MD;  Location: [facility];  Service: Cardiovascular;  Laterality: N/A;  . TUBAL LIGATION      Current Medications: Current Meds  Medication Sig  . albuterol (PROVENTIL HFA;VENTOLIN HFA) 108 (90 Base) MCG/ACT inhaler Inhale 2 puffs into the lungs every 6 (six) hours as needed for wheezing or shortness of breath.   Marland Kitchen atorvastatin (LIPITOR) 40 MG tablet Take 1 tablet (40 mg total) by mouth daily.  Marland Kitchen dexlansoprazole (DEXILANT) 60 MG capsule Take 1 capsule (60 mg total) by mouth daily. (Patient taking differently: Take 1 capsule by mouth daily before breakfast. )  . Fluticasone-Salmeterol (ADVAIR DISKUS) 100-50 MCG/DOSE AEPB Inhale 1 puff into the lungs 2 (two) times daily as needed (for shortness of breath or wheezing).  . gabapentin (NEURONTIN) 600 MG tablet Take one AM, take two qhs (Patient taking differently: Take 600-1,200 mg by mouth See admin instructions. Take 600 mg by mouth in the morning and take 1200 mg by mouth at bedtime)  . glucose blood (ONE TOUCH ULTRA TEST) test strip 1 each by Other route 2 (two) times daily. And lancets 2/day  . ibuprofen (ADVIL) 200 MG tablet Take 200 mg by mouth daily.  . insulin NPH-regular Human (HUMULIN 70/30) (70-30) 100 UNIT/ML injection INJECT 110 UNITS WITH BREAKFAST AND 50 UNITS WITH EVENING MEAL (  Patient taking differently: Inject 50-95 Units into the skin See admin instructions. Inject 95 units SQ with breakfast and inject 50 units SQ with evening meal)  . olmesartan-hydrochlorothiazide (BENICAR HCT) 40-12.5 MG tablet Take 1 tablet by mouth daily.  . polyethylene glycol powder ([employer] PURELAX) 17 GM/SCOOP powder DISSOLVE 1 CAPFUL IN AT LEAST 8 OUNCES WATER/JUICE AND DRINK TWICE DAILY (Patient taking differently: Take 17 g by mouth at bedtime. )  . sucralfate (CARAFATE) 1 GM/10ML suspension Take 10 mLs (1 g total) by mouth 4 (four) times daily.  . valACYclovir  (VALTREX) 500 MG tablet Take 1 tablet (500 mg total) by mouth daily.     Allergies:   Gluten meal and Nsaids   Social History   Socioeconomic History  . Marital status: Married    Spouse name: Jessica [name]  . Number of children: 2  . Years of education: Not on file  . Highest education level: Not on file  Occupational History  . 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 Financial resource strain: Not on file  . Food insecurity    Worry: Not on file    Inability: Not on file  . Transportation needs    Medical: Not on file    Non-medical: Not on file  Tobacco Use  . Smoking status: Never Smoker  . Smokeless tobacco: Never Used  Substance and Sexual Activity  . Alcohol use: No  . Drug use: No  . Sexual activity: Yes    Birth control/protection: Post-menopausal, Surgical  Lifestyle  . Physical activity    Days per week: Not on file    Minutes per session: Not on file  . Stress: Not on file  Relationships  . Social Herbalist on phone: Not on file    Gets together: Not on file    Attends religious service: Not on file    Active member of club or organization: Not on file    Attends meetings of clubs or organizations: Not on file    Relationship status: Not on file  Other Topics Concern  . Not on file  Social History Narrative  . Not on file     Family History:  The patient's family history includes COPD in her father; Colon cancer in her sister; Diabetes in her father and sister; Endometrial cancer in her sister; Heart attack in her father; Heart failure in her father; Hypertension in her brother and mother; Other in her father.   ROS:   Please see the history of present illness.    ROS All other systems reviewed and are negative.   PHYSICAL EXAM:   VS:  BP (!) 142/76   Pulse 83   Ht 5' 5"  (1.651 m)   Wt 247 lb 6.4 oz (112.2 kg)   SpO2 98%   BMI 41.17 kg/m   Physical Exam  GEN: Well  nourished, well developed, in no acute distress  Neck: no JVD, carotid bruits, or masses Cardiac:RRR; no murmurs, rubs, or gallops  Respiratory:  clear to auscultation bilaterally, normal work of breathing GI: soft, nontender, nondistended, + BS Ext: Cath site without hematoma or hemorrhage good radial brachial pulses little bit of bruising, lower extremities without cyanosis, clubbing, or edema, Good distal pulses bilaterally Neuro:  Alert and Oriented x 3 Psych: euthymic mood, full affect  Wt Readings from Last 3 Encounters:  11/13/18 247 lb 6.4 oz (112.2 kg)  10/31/18  243 lb (110.2 kg)  10/28/18 244 lb 3.2 oz (110.8 kg)      Studies/Labs Reviewed:   EKG:  EKG is ordered today.    Recent Labs: 10/16/2018: ALT 12; TSH 2.15 10/28/2018: BUN 22; Creatinine, Ser 1.06; Hemoglobin 13.4; Platelets 260; Potassium 4.5; Sodium 137   Lipid Panel    Component Value Date/Time   CHOL 134 10/16/2018 0932   TRIG 64.0 10/16/2018 0932   HDL 47.40 10/16/2018 0932   CHOLHDL 3 10/16/2018 0932   VLDL 12.8 10/16/2018 0932   LDLCALC 74 10/16/2018 0932   LDLDIRECT 143.2 11/23/2006 1130    Additional studies/ records that were reviewed today include:   NST 10/23/18  The left ventricular ejection fraction is normal (55-65%).  Nuclear stress EF: 57%.  There was no ST segment deviation noted during stress.  Defect 1: There is a large defect of moderate severity present in the mid inferoseptal, mid inferior, apical anterior, apical septal, apical inferior, apical lateral and apex location.  This is an intermediate risk study.  No prior study for comparison   Normal LV EF, but fixed large defect in apical region. Cannot exclude ischemia/infarct. TID value borderline, cannot exclude multivessel balanced ischemia.  Cardiac cath 10/23/18  Mid LAD lesion is 10% stenosed.  The left ventricular systolic function is normal.  LV end diastolic pressure is normal. LVEDP 11 mm Hg.  The left  ventricular ejection fraction is 55-65% by visual estimate.  There is no aortic valve stenosis.   Minimal CAD.  False positive stress test.  Continue preventive therapy.       ASSESSMENT:    1. False positive cardiac stress test   2. Hyperlipidemia, unspecified hyperlipidemia type   3. Type 2 diabetes mellitus with complication, with long-term current use of insulin ([place])   4. Morbid obesity ([place])      PLAN:  In order of problems listed above:   False positve NST with nonobstructive disease on cath 10/23/18 . F/u with Dr. Irish Lack prn  HLD LDL 74 on lipitor 40 mg daily.  DM A1C 6.9  Obesity struggles with weight and balancing celiacdisease and diabetes. Will refer to weight loss center as patient is interested. Unable to exercise with knee.    Medication Adjustments/Labs and Tests Ordered: Current medicines are reviewed at length with the patient today.  Concerns regarding medicines are outlined above.  Medication changes, Labs and Tests ordered today are listed in the Patient Instructions below. There are no Patient Instructions on file for this visit.   Sumner Boast, PA-C  11/13/2018 11:56 AM    [facility] [address], [place], [place]  [address] Phone: [phone]; Fax: [phone]

## 2018-11-12 NOTE — Telephone Encounter (Signed)

## 2018-11-13 ENCOUNTER — Ambulatory Visit (INDEPENDENT_AMBULATORY_CARE_PROVIDER_SITE_OTHER): Payer: Medicare Other | Admitting: Physician Assistant

## 2018-11-13 ENCOUNTER — Other Ambulatory Visit: Payer: Self-pay

## 2018-11-13 ENCOUNTER — Encounter: Payer: Self-pay | Admitting: Physician Assistant

## 2018-11-13 DIAGNOSIS — Z789 Other specified health status: Secondary | ICD-10-CM | POA: Diagnosis not present

## 2018-11-13 DIAGNOSIS — E785 Hyperlipidemia, unspecified: Secondary | ICD-10-CM

## 2018-11-13 DIAGNOSIS — E118 Type 2 diabetes mellitus with unspecified complications: Secondary | ICD-10-CM

## 2018-11-13 DIAGNOSIS — Z794 Long term (current) use of insulin: Secondary | ICD-10-CM | POA: Diagnosis not present

## 2018-11-13 NOTE — Patient Instructions (Signed)
Medication Instructions:  Your physician recommends that you continue on your current medications as directed. Please refer to the Current Medication list given to you today.  If you need a refill on your cardiac medications before your next appointment, please call your pharmacy.   Lab work: None Ordered  If you have labs (blood work) drawn today and your tests are completely normal, you will receive your results only by: Marland Kitchen MyChart Message (if you have MyChart) OR . A paper copy in the mail If you have any lab test that is abnormal or we need to change your treatment, we will call you to review the results.  Testing/Procedures: None ordered  Follow-Up: . AS NEEDED with Dr. Irish Lack . You have been referred to Dr. Arnell Sieving for weight loss .   Any Other Special Instructions Will Be Listed Below (If Applicable).

## 2018-11-18 ENCOUNTER — Other Ambulatory Visit: Payer: Self-pay

## 2018-11-20 ENCOUNTER — Other Ambulatory Visit: Payer: Self-pay

## 2018-11-20 ENCOUNTER — Ambulatory Visit (INDEPENDENT_AMBULATORY_CARE_PROVIDER_SITE_OTHER): Payer: Medicare Other | Admitting: Endocrinology

## 2018-11-20 ENCOUNTER — Encounter: Payer: Self-pay | Admitting: Endocrinology

## 2018-11-20 VITALS — BP 154/70 | HR 96 | Ht 65.0 in | Wt 246.0 lb

## 2018-11-20 DIAGNOSIS — Z794 Long term (current) use of insulin: Secondary | ICD-10-CM

## 2018-11-20 DIAGNOSIS — E1143 Type 2 diabetes mellitus with diabetic autonomic (poly)neuropathy: Secondary | ICD-10-CM | POA: Diagnosis not present

## 2018-11-20 DIAGNOSIS — E118 Type 2 diabetes mellitus with unspecified complications: Secondary | ICD-10-CM | POA: Diagnosis not present

## 2018-11-20 LAB — POCT GLYCOSYLATED HEMOGLOBIN (HGB A1C): Hemoglobin A1C: 6.9 % — AB (ref 4.0–5.6)

## 2018-11-20 MED ORDER — HUMULIN 70/30 (70-30) 100 UNIT/ML ~~LOC~~ SUSP: SUBCUTANEOUS | 5 refills | Status: DC

## 2018-11-20 NOTE — Progress Notes (Signed)
Subjective:    Patient ID: Jessica [name], female    DOB: Nov 19, 1950, 68 y.o.   MRN: [id number]  HPI Pt returns for f/u of diabetes mellitus:  DM type: Insulin-requiring type 2. Dx'ed: 2000.  Complications: polyneuropathy and autonomic neuropathy.   Therapy: insulin since 2005 GDM: never DKA: never Severe hypoglycemia: never.  Pancreatitis: never.  Other: her ability to care for her DM is compromised by her husband's illness; she has done better on a BID insulin regimen; she takes human insulin, due to cost.   Interval history: no cbg record, but states cbg's vary from 51-178.  It is in general lowest in the middle of the night.  pt states she feels well in general. Past Medical History:  Diagnosis Date  . Anxiety   . Arthritis    bilateral knees  . Asthma    childhood  . Autonomic neuropathy    diabetic  . Cataract   . Celiac disease   . Depression   . Diabetes mellitus    type II, Hemoglobin A1C 9.9 10/05/2011  . Diabetic neuropathy ([place])   . Diverticulosis   . Fatty liver   . Gastroparesis   . GERD (gastroesophageal reflux disease)   . Hyperlipidemia   . Hypertension   . IBS (irritable bowel syndrome)   . Personal history of colonic polyps 03/2010   hyperplastic  . Pneumonia 11/03/2015   current    Past Surgical History:  Procedure Laterality Date  . APPENDECTOMY    . CATARACT EXTRACTION Right 04/29/2018  . CATARACT EXTRACTION Left 03/2018  . COLONOSCOPY    . DILATATION & CURRETTAGE/HYSTEROSCOPY WITH RESECTOCOPE N/A 03/24/2013   Procedure: DILATATION & CURETTAGE, HYSTEROSCOPY WITH RESECTION;  Surgeon: Olga Millers, MD;  Location: [facility];  Service: Gynecology;  Laterality: N/A;  . HYSTEROSCOPY W/D&C N/A 11/08/2015   Procedure: DILATATION AND CURETTAGE /HYSTEROSCOPY;  Surgeon: Olga Millers, MD;  Location: [facility];  Service: Gynecology;  Laterality: N/A;  . left breast cyst removal  2000  . LEFT HEART CATH AND CORONARY ANGIOGRAPHY N/A 10/31/2018   Procedure:  LEFT HEART CATH AND CORONARY ANGIOGRAPHY;  Surgeon: Jettie Booze, MD;  Location: [facility];  Service: Cardiovascular;  Laterality: N/A;  . TUBAL LIGATION      Social History   Socioeconomic History  . Marital status: Married    Spouse name: Devar Fishman  . Number of children: 2  . Years of education: Not on file  . Highest education level: Not on file  Occupational History  . 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 Financial resource strain: Not on file  . Food insecurity    Worry: Not on file    Inability: Not on file  . Transportation needs    Medical: Not on file    Non-medical: Not on file  Tobacco Use  . Smoking status: Never Smoker  . Smokeless tobacco: Never Used  Substance and Sexual Activity  . Alcohol use: No  . Drug use: No  . Sexual activity: Yes    Birth control/protection: Post-menopausal, Surgical  Lifestyle  . Physical activity    Days per week: Not on file    Minutes per session: Not on file  . Stress: Not on file  Relationships  . Social Herbalist on phone: Not on file    Gets together: Not on file    Attends religious  service: Not on file    Active member of club or organization: Not on file    Attends meetings of clubs or organizations: Not on file    Relationship status: Not on file  . Intimate partner violence    Fear of current or ex partner: Not on file    Emotionally abused: Not on file    Physically abused: Not on file    Forced sexual activity: Not on file  Other Topics Concern  . Not on file  Social History Narrative  . Not on file    Current Outpatient Medications on File Prior to Visit  Medication Sig Dispense Refill  . albuterol (PROVENTIL HFA;VENTOLIN HFA) 108 (90 Base) MCG/ACT inhaler Inhale 2 puffs into the lungs every 6 (six) hours as needed for wheezing or shortness of breath.     Marland Kitchen atorvastatin (LIPITOR) 40 MG tablet Take 1 tablet  (40 mg total) by mouth daily. 90 tablet 1  . Fluticasone-Salmeterol (ADVAIR DISKUS) 100-50 MCG/DOSE AEPB Inhale 1 puff into the lungs 2 (two) times daily as needed (for shortness of breath or wheezing). 180 each 3  . gabapentin (NEURONTIN) 600 MG tablet Take one AM, take two qhs (Patient taking differently: Take 600-1,200 mg by mouth See admin instructions. Take 600 mg by mouth in the morning and take 1200 mg by mouth at bedtime) 90 tablet 3  . glucose blood (ONE TOUCH ULTRA TEST) test strip 1 each by Other route 2 (two) times daily. And lancets 2/day 200 each 3  . ibuprofen (ADVIL) 200 MG tablet Take 200 mg by mouth daily.    Marland Kitchen olmesartan-hydrochlorothiazide (BENICAR HCT) 40-12.5 MG tablet Take 1 tablet by mouth daily. 90 tablet 3  . polyethylene glycol powder ([facility] PURELAX) 17 GM/SCOOP powder DISSOLVE 1 CAPFUL IN AT LEAST 8 OUNCES WATER/JUICE AND DRINK TWICE DAILY (Patient taking differently: Take 17 g by mouth at bedtime. ) 1020 g 2  . sucralfate (CARAFATE) 1 GM/10ML suspension Take 10 mLs (1 g total) by mouth 4 (four) times daily. 420 mL 1  . valACYclovir (VALTREX) 500 MG tablet Take 1 tablet (500 mg total) by mouth daily. 90 tablet 3   No current facility-administered medications on file prior to visit.     Allergies  Allergen Reactions  . Gluten Meal Other (See Comments)    No wheat or soy  . Nsaids Nausea And Vomiting and Other (See Comments)    Cannot tolerate large doses     Family History  Problem Relation Age of Onset  . Hypertension Mother   . Colon cancer Sister        dx in her early 30s  . Diabetes Sister   . Endometrial cancer Sister   . Hypertension Brother   . Other Father        2 collapsed lungs  . COPD Father   . Diabetes Father   . Heart attack Father   . Heart failure Father     BP (!) 154/70 (BP Location: Left Arm, Patient Position: Sitting, Cuff Size: Large)   Pulse 96   Ht 5' 5"  (1.651 m)   Wt 246 lb (111.6 kg)   SpO2 93%   BMI 40.94 kg/m     Review of Systems Denies LOC    Objective:   Physical Exam VITAL SIGNS:  See vs page GENERAL: no distress Pulses: dorsalis pedis intact bilat.   MSK: no deformity of the feet CV: trace bilat leg edema Skin:  no ulcer on the  feet.  normal color and temp on the feet. Neuro: sensation is intact to touch on the feet, but decreased from normal  Lab Results  Component Value Date   HGBA1C 6.9 (A) 11/20/2018       Assessment & Plan:  HTN: is noted today Insulin-requiring type 2 DM, with PN Hypoglycemia: this limits aggressiveness of glycemic control   Patient Instructions  Your blood pressure is high today.  Please see your primary care provider soon, to have it rechecked Please reduce the insulin to 95 units with breakfast, and 35 units with supper.    check your blood sugar twice a day.  vary the time of day when you check, between before the 3 meals, and at bedtime.  also check if you have symptoms of your blood sugar being too high or too low.  please keep a record of the readings and bring it to your next appointment here (or you can bring the meter itself).  You can write it on any piece of paper.  please call us sooner if your blood sugar goes below 70, or if you have a lot of readings over 200.  Please come back for a follow-up appointment in 3 months.

## 2018-11-20 NOTE — Patient Instructions (Addendum)
Your blood pressure is high today.  Please see your primary care provider soon, to have it rechecked Please reduce the insulin to 95 units with breakfast, and 35 units with supper.    check your blood sugar twice a day.  vary the time of day when you check, between before the 3 meals, and at bedtime.  also check if you have symptoms of your blood sugar being too high or too low.  please keep a record of the readings and bring it to your next appointment here (or you can bring the meter itself).  You can write it on any piece of paper.  please call us sooner if your blood sugar goes below 70, or if you have a lot of readings over 200.  Please come back for a follow-up appointment in 3 months.

## 2018-11-20 NOTE — Progress Notes (Signed)
error 

## 2018-11-21 ENCOUNTER — Other Ambulatory Visit: Payer: Self-pay | Admitting: Internal Medicine

## 2018-11-21 ENCOUNTER — Telehealth: Payer: Self-pay | Admitting: Internal Medicine

## 2018-11-21 NOTE — Telephone Encounter (Signed)
Patient indicates that she has 1 more dose of sucralfate left and then has 1 additional refill left. States she has taken 2 weeks worth and does feel better now. She is also now taking Dexilant 60 mg daily. I advised that since she is feeling better, she can now discontinue the sucralfate for now and continue Dexilant. Should her symptoms return before her office visit, she may refill the sucralfate and restart it. She verbalizes understanding.

## 2018-11-21 NOTE — Telephone Encounter (Signed)
Patient called said that she would like to know if she needs a refill for sucralfate (CARAFATE) 1 GM/10ML or if she is going to stop once she finishes the medication she has left. She said that it has really helped.

## 2018-12-24 ENCOUNTER — Encounter: Payer: Self-pay | Admitting: Internal Medicine

## 2018-12-24 ENCOUNTER — Ambulatory Visit (INDEPENDENT_AMBULATORY_CARE_PROVIDER_SITE_OTHER): Payer: Medicare Other | Admitting: Internal Medicine

## 2018-12-24 VITALS — BP 142/70 | HR 72 | Temp 96.1°F | Ht 65.0 in | Wt 252.2 lb

## 2018-12-24 DIAGNOSIS — K900 Celiac disease: Secondary | ICD-10-CM | POA: Diagnosis not present

## 2018-12-24 DIAGNOSIS — Z800 Family history of malignant neoplasm of digestive organs: Secondary | ICD-10-CM | POA: Diagnosis not present

## 2018-12-24 DIAGNOSIS — K219 Gastro-esophageal reflux disease without esophagitis: Secondary | ICD-10-CM

## 2018-12-24 DIAGNOSIS — K2960 Other gastritis without bleeding: Secondary | ICD-10-CM

## 2018-12-24 DIAGNOSIS — K581 Irritable bowel syndrome with constipation: Secondary | ICD-10-CM

## 2018-12-24 DIAGNOSIS — T39395A Adverse effect of other nonsteroidal anti-inflammatory drugs [NSAID], initial encounter: Secondary | ICD-10-CM

## 2018-12-24 NOTE — Progress Notes (Signed)
   Subjective:    Patient ID: Jessica [name], female    DOB: 10-13-1950, 68 y.o.   MRN: [id number]  HPI Jessica [name] is a 68 year old female with a history of celiac disease, GERD, IBS with constipation, hyperplastic colon polyps, family history of colon cancer in her sister who is seen for follow-up.  She is seen in person today.  She was last seen virtually on 08/26/2018.  At the time of our last visit she was having nausea with upper abdominal discomfort and GERD symptoms.  I was suspicious for NSAID related gastritis.  I asked her to cut back her ibuprofen.  We continue Dexilant and I also added Carafate.  She used Carafate for 3 or 4 weeks but has since stopped it.  She has had complete resolution of her abdominal pain, nausea.  Her reflux symptoms are under better control.  Her constipation is doing well with MiraLAX as long as she takes this 17 g daily.  She does continue a strict gluten-free diet.   Review of Systems As per HPI, otherwise negative  Current Medications, Allergies, Past Medical History, Past Surgical History, Family History and Social History were reviewed in [facility] record.     Objective:   Physical Exam BP (!) 142/70   Pulse 72   Temp (!) 96.1 F (35.6 C)   Ht 5' 5"  (1.651 m)   Wt 252 lb 3.2 oz (114.4 kg)   BMI 41.97 kg/m  Gen: awake, alert, NAD HEENT: anicteric, op clear CV: RRR, no mrg Pulm: CTA b/l Abd: soft, NT/ND, +BS throughout Ext: no c/c/e Neuro: nonfocal     Assessment & Plan:  68 year old female with a history of celiac disease, GERD, IBS with constipation, hyperplastic colon polyps, family history of colon cancer in her sister who is seen for follow-up.   1.  GERD/likely NSAID related gastritis --she was using ibuprofen more frequently and now she is using this very sparingly.  She responded to reducing ibuprofen and also Carafate.  We will continue Dexilant 60 mg daily.  She will remain off Carafate for now.  We  could use this again in the future if needed. --Continue Dexilant 60 mg daily  2.  Chronic constipation IBS --symptoms well controlled with MiraLAX. --Continue MiraLAX 17 g daily  3.  Celiac disease --continue gluten-free diet  4.  Family history of colon cancer --repeat screening colonoscopy recommended April 2021  15 minutes spent with the patient today. Greater than 50% was spent in counseling and coordination of care with the patient I will see her in about 6 months at which point we can discuss scheduling colonoscopy

## 2018-12-24 NOTE — Patient Instructions (Signed)
Continue Dexilant 60 mg daily.  Continue Miralax.  Please follow up with Dr Hilarie Fredrickson in 6 months.  If you are age 68 or older, your body mass index should be between 23-30. Your Body mass index is 41.97 kg/m. If this is out of the aforementioned range listed, please consider follow up with your Primary Care Provider.  If you are age 64 or younger, your body mass index should be between 19-25. Your Body mass index is 41.97 kg/m. If this is out of the aformentioned range listed, please consider follow up with your Primary Care Provider.

## 2018-12-30 DIAGNOSIS — H02844 Edema of left upper eyelid: Secondary | ICD-10-CM | POA: Diagnosis not present

## 2018-12-30 DIAGNOSIS — H02842 Edema of right lower eyelid: Secondary | ICD-10-CM | POA: Diagnosis not present

## 2019-01-14 ENCOUNTER — Telehealth: Payer: Self-pay | Admitting: Internal Medicine

## 2019-01-14 NOTE — Telephone Encounter (Signed)
Pt needs rf [facility] [place].

## 2019-01-15 MED ORDER — POLYETHYLENE GLYCOL 3350 17 GM/SCOOP PO POWD: ORAL | 2 refills | Status: DC

## 2019-01-15 NOTE — Telephone Encounter (Signed)
Rx sent 

## 2019-01-16 ENCOUNTER — Ambulatory Visit (INDEPENDENT_AMBULATORY_CARE_PROVIDER_SITE_OTHER): Payer: Medicare Other

## 2019-01-16 ENCOUNTER — Ambulatory Visit (INDEPENDENT_AMBULATORY_CARE_PROVIDER_SITE_OTHER): Payer: Medicare Other | Admitting: Family Medicine

## 2019-01-16 ENCOUNTER — Encounter: Payer: Self-pay | Admitting: Family Medicine

## 2019-01-16 ENCOUNTER — Other Ambulatory Visit: Payer: Self-pay

## 2019-01-16 VITALS — BP 124/68 | HR 74 | Temp 97.3°F | Resp 16 | Ht 65.0 in | Wt 250.6 lb

## 2019-01-16 VITALS — BP 124/68 | Ht 65.0 in | Wt 250.7 lb

## 2019-01-16 DIAGNOSIS — Z23 Encounter for immunization: Secondary | ICD-10-CM

## 2019-01-16 DIAGNOSIS — I10 Essential (primary) hypertension: Secondary | ICD-10-CM | POA: Diagnosis not present

## 2019-01-16 DIAGNOSIS — E1143 Type 2 diabetes mellitus with diabetic autonomic (poly)neuropathy: Secondary | ICD-10-CM

## 2019-01-16 DIAGNOSIS — Z0000 Encounter for general adult medical examination without abnormal findings: Secondary | ICD-10-CM | POA: Diagnosis not present

## 2019-01-16 DIAGNOSIS — E118 Type 2 diabetes mellitus with unspecified complications: Secondary | ICD-10-CM | POA: Diagnosis not present

## 2019-01-16 DIAGNOSIS — I2510 Atherosclerotic heart disease of native coronary artery without angina pectoris: Secondary | ICD-10-CM

## 2019-01-16 DIAGNOSIS — Z794 Long term (current) use of insulin: Secondary | ICD-10-CM

## 2019-01-16 DIAGNOSIS — K3184 Gastroparesis: Secondary | ICD-10-CM

## 2019-01-16 NOTE — Patient Instructions (Signed)
Jessica [name] , Thank you for taking time to come for your Medicare Wellness Visit. I appreciate your ongoing commitment to your health goals. Please review the following plan we discussed and let me know if I can assist you in the future.   Screening recommendations/referrals: Colorectal Screening: completed 08/25/14 with Dr. Hilarie Fredrickson  Mammogram: ordered today Bone Density: up to date; last 10/30/17  Vision and Dental Exams: Recommended annual ophthalmology exams for early detection of glaucoma and other disorders of the eye Recommended annual dental exams for proper oral hygiene  Diabetic Exams: Diabetic Eye Exam: up to date  Diabetic Foot Exam: up to date   Vaccinations: Influenza vaccine: today  Pneumococcal vaccine: up to date; last 11/03/15 Tdap vaccine: up to date; last 10/25/17 Shingles vaccine: Please call your insurance company to determine your out of pocket expense for the Shingrix vaccine. You may receive this vaccine at your local pharmacy.  Advanced directives: Please bring a copy of your POA (Power of Attorney) and/or Living Will to your next appointment.  Goals: Recommend to drink at least 6-8 8oz glasses of water per day and continue to watch carb intake.   Next appointment: Please schedule your Annual Wellness Visit with your Nurse Health Advisor in one year.  Preventive Care 72 Years and Older, Female Preventive care refers to lifestyle choices and visits with your health care provider that can promote health and wellness. What does preventive care include?  A yearly physical exam. This is also called an annual well check.  Dental exams once or twice a year.  Routine eye exams. Ask your health care provider how often you should have your eyes checked.  Personal lifestyle choices, including:  Daily care of your teeth and gums.  Regular physical activity.  Eating a healthy diet.  Avoiding tobacco and drug use.  Limiting alcohol use.  Practicing safe sex.   Taking low-dose aspirin every day if recommended by your health care provider.  Taking vitamin and mineral supplements as recommended by your health care provider. What happens during an annual well check? The services and screenings done by your health care provider during your annual well check will depend on your age, overall health, lifestyle risk factors, and family history of disease. Counseling  Your health care provider may ask you questions about your:  Alcohol use.  Tobacco use.  Drug use.  Emotional well-being.  Home and relationship well-being.  Sexual activity.  Eating habits.  History of falls.  Memory and ability to understand (cognition).  Work and work Statistician.  Reproductive health. Screening  You may have the following tests or measurements:  Height, weight, and BMI.  Blood pressure.  Lipid and cholesterol levels. These may be checked every 5 years, or more frequently if you are over 35 years old.  Skin check.  Lung cancer screening. You may have this screening every year starting at age 33 if you have a 30-pack-year history of smoking and currently smoke or have quit within the past 15 years.  Fecal occult blood test (FOBT) of the stool. You may have this test every year starting at age 105.  Flexible sigmoidoscopy or colonoscopy. You may have a sigmoidoscopy every 5 years or a colonoscopy every 10 years starting at age 59.  Hepatitis C blood test.  Hepatitis B blood test.  Sexually transmitted disease (STD) testing.  Diabetes screening. This is done by checking your blood sugar (glucose) after you have not eaten for a while (fasting). You may have this  done every 1-3 years.  Bone density scan. This is done to screen for osteoporosis. You may have this done starting at age 38.  Mammogram. This may be done every 1-2 years. Talk to your health care provider about how often you should have regular mammograms. Talk with your health care  provider about your test results, treatment options, and if necessary, the need for more tests. Vaccines  Your health care provider may recommend certain vaccines, such as:  Influenza vaccine. This is recommended every year.  Tetanus, diphtheria, and acellular pertussis (Tdap, Td) vaccine. You may need a Td booster every 10 years.  Zoster vaccine. You may need this after age 28.  Pneumococcal 13-valent conjugate (PCV13) vaccine. One dose is recommended after age 27.  Pneumococcal polysaccharide (PPSV23) vaccine. One dose is recommended after age 55. Talk to your health care provider about which screenings and vaccines you need and how often you need them. This information is not intended to replace advice given to you by your health care provider. Make sure you discuss any questions you have with your health care provider. Document Released: 05/14/2015 Document Revised: 01/05/2016 Document Reviewed: 02/16/2015 Elsevier Interactive Patient Education  2017 [place] Prevention in the Home Falls can cause injuries. They can happen to people of all ages. There are many things you can do to make your home safe and to help prevent falls. What can I do on the outside of my home?  Regularly fix the edges of walkways and driveways and fix any cracks.  Remove anything that might make you trip as you walk through a door, such as a raised step or threshold.  Trim any bushes or trees on the path to your home.  Use bright outdoor lighting.  Clear any walking paths of anything that might make someone trip, such as rocks or tools.  Regularly check to see if handrails are loose or broken. Make sure that both sides of any steps have handrails.  Any raised decks and porches should have guardrails on the edges.  Have any leaves, snow, or ice cleared regularly.  Use sand or salt on walking paths during winter.  Clean up any spills in your garage right away. This includes oil or grease  spills. What can I do in the bathroom?  Use night lights.  Install grab bars by the toilet and in the tub and shower. Do not use towel bars as grab bars.  Use non-skid mats or decals in the tub or shower.  If you need to sit down in the shower, use a plastic, non-slip stool.  Keep the floor dry. Clean up any water that spills on the floor as soon as it happens.  Remove soap buildup in the tub or shower regularly.  Attach bath mats securely with double-sided non-slip rug tape.  Do not have throw rugs and other things on the floor that can make you trip. What can I do in the bedroom?  Use night lights.  Make sure that you have a light by your bed that is easy to reach.  Do not use any sheets or blankets that are too big for your bed. They should not hang down onto the floor.  Have a firm chair that has side arms. You can use this for support while you get dressed.  Do not have throw rugs and other things on the floor that can make you trip. What can I do in the kitchen?  Clean up any spills  right away.  Avoid walking on wet floors.  Keep items that you use a lot in easy-to-reach places.  If you need to reach something above you, use a strong step stool that has a grab bar.  Keep electrical cords out of the way.  Do not use floor polish or wax that makes floors slippery. If you must use wax, use non-skid floor wax.  Do not have throw rugs and other things on the floor that can make you trip. What can I do with my stairs?  Do not leave any items on the stairs.  Make sure that there are handrails on both sides of the stairs and use them. Fix handrails that are broken or loose. Make sure that handrails are as long as the stairways.  Check any carpeting to make sure that it is firmly attached to the stairs. Fix any carpet that is loose or worn.  Avoid having throw rugs at the top or bottom of the stairs. If you do have throw rugs, attach them to the floor with carpet  tape.  Make sure that you have a light switch at the top of the stairs and the bottom of the stairs. If you do not have them, ask someone to add them for you. What else can I do to help prevent falls?  Wear shoes that:  Do not have high heels.  Have rubber bottoms.  Are comfortable and fit you well.  Are closed at the toe. Do not wear sandals.  If you use a stepladder:  Make sure that it is fully opened. Do not climb a closed stepladder.  Make sure that both sides of the stepladder are locked into place.  Ask someone to hold it for you, if possible.  Clearly mark and make sure that you can see:  Any grab bars or handrails.  First and last steps.  Where the edge of each step is.  Use tools that help you move around (mobility aids) if they are needed. These include:  Canes.  Walkers.  Scooters.  Crutches.  Turn on the lights when you go into a dark area. Replace any light bulbs as soon as they burn out.  Set up your furniture so you have a clear path. Avoid moving your furniture around.  If any of your floors are uneven, fix them.  If there are any pets around you, be aware of where they are.  Review your medicines with your doctor. Some medicines can make you feel dizzy. This can increase your chance of falling. Ask your doctor what other things that you can do to help prevent falls. This information is not intended to replace advice given to you by your health care provider. Make sure you discuss any questions you have with your health care provider. Document Released: 02/11/2009 Document Revised: 09/23/2015 Document Reviewed: 05/22/2014 Elsevier Interactive Patient Education  2017 [employer].

## 2019-01-16 NOTE — Progress Notes (Signed)
Reviewed and agree with AWV summary as documented belwo.  Can refer to healthy weight and wellness for obesity if she'd like Thanks.

## 2019-01-16 NOTE — Addendum Note (Signed)
Addended by: Layla Barter on: 01/16/2019 04:15 PM   Modules accepted: Orders

## 2019-01-16 NOTE — Progress Notes (Signed)
Subjective  CC:  Chief Complaint  Patient presents with  . Hypertension  . Substernal Chest Pain    Reports improvement, still some pains    HPI: Jessica [name] is a 68 y.o. female who presents to the office today to address the problems listed above in the chief complaint.  Hypertension f/u: Control is mostly good although it was just a little up at recent endocrinology visit.  she also reports one episode of symptomatic low at 104/40. On average however, home readings run 120/70s.  Pt reports she is doing well. taking medications as instructed, no medication side effects noted, no TIAs, no chest pain on exertion, no dyspnea on exertion, no swelling of ankles. She denies adverse effects from his BP medications. Compliance with medication is good.  Dm control is good. Reviewed recent notes. Does c/o right great toe soreness at edge.   Chest pain with mild CAD by cath: cleared by cards. On statin and aspirin. No more significant sxs  Gastritis and gastroparesis: both better. Reviewed recent gi notes.   HM: due mammogram. Gets at GYN office and scheduled for October.    Assessment  1. Essential hypertension   2. Nonobstructive atherosclerosis of coronary artery   3. Encounter for screening mammogram for malignant neoplasm of breast    4. Type 2 diabetes mellitus with complication, with long-term current use of insulin ([place])   5. Diabetic gastroparesis ([place])      Plan    Hypertension f/u: BP control is fairly well controlled. Will continue monitoring at home. No med changes today  Hyperlipidemia f/u: almost at goal on statin  HM: needs mammogram and flu shot today. mammo at gyn office this year.   Flu shot today.   To schedule with podiatrist for mild ingrow toenail. Diabetic foot care.   Education regarding management of these chronic disease states was given. Management strategies discussed on successive visits include dietary and exercise recommendations, goals of  achieving and maintaining IBW, and lifestyle modifications aiming for adequate sleep and minimizing stressors.   Follow up: Return in about 6 months (around 07/16/2019) for follow up Hypertension.  No orders of the defined types were placed in this encounter.  No orders of the defined types were placed in this encounter.     BP Readings from Last 3 Encounters:  01/16/19 124/68  01/16/19 124/68  12/24/18 (!) 142/70   Wt Readings from Last 3 Encounters:  01/16/19 250 lb 10.6 oz (113.7 kg)  01/16/19 250 lb 9.6 oz (113.7 kg)  12/24/18 252 lb 3.2 oz (114.4 kg)    Lab Results  Component Value Date   CHOL 134 10/16/2018   CHOL 133 10/25/2017   CHOL 143 10/02/2016   Lab Results  Component Value Date   HDL 47.40 10/16/2018   HDL 40.60 10/25/2017   HDL 43.10 10/02/2016   Lab Results  Component Value Date   LDLCALC 74 10/16/2018   LDLCALC 77 10/25/2017   LDLCALC 87 10/02/2016   Lab Results  Component Value Date   TRIG 64.0 10/16/2018   TRIG 75.0 10/25/2017   TRIG 68.0 10/02/2016   Lab Results  Component Value Date   CHOLHDL 3 10/16/2018   CHOLHDL 3 10/25/2017   CHOLHDL 3 10/02/2016   Lab Results  Component Value Date   LDLDIRECT 143.2 11/23/2006   Lab Results  Component Value Date   CREATININE 1.06 (H) 10/28/2018   BUN 22 10/28/2018   NA 137 10/28/2018   K 4.5 10/28/2018  CL 100 10/28/2018   CO2 22 10/28/2018    The 10-year ASCVD risk score Mikey Bussing DC Jr., et al., 2013) is: 16.2%   Values used to calculate the score:     Age: 27 years     Sex: Female     Is Non-Hispanic African American: Yes     Diabetic: Yes     Tobacco smoker: No     Systolic Blood Pressure: 762 mmHg     Is BP treated: Yes     HDL Cholesterol: 47.4 mg/dL     Total Cholesterol: 134 mg/dL  I reviewed the patients updated PMH, FH, and SocHx.    Patient Active Problem List   Diagnosis Date Noted  . Celiac disease 02/14/2018    Priority: High  . Diabetic neuropathy associated with  type 2 diabetes mellitus ([place]) 02/14/2018    Priority: High  . Morbid obesity ([place]) 02/14/2018    Priority: High  . Type 2 diabetes mellitus with complication, with long-term current use of insulin ([place]) 09/23/2010    Priority: High  . Hypercholesterolemia 01/29/2009    Priority: High  . Essential hypertension 10/22/2006    Priority: High  . Osteopenia 02/14/2018    Priority: Medium  . Mild intermittent asthma 02/14/2018    Priority: Medium  . Degenerative arthritis of knee, bilateral 10/29/2015    Priority: Medium  . Degenerative cervical disc 07/27/2015    Priority: Medium  . OSA (obstructive sleep apnea) 02/28/2011    Priority: Medium  . Constipation, slow transit 09/23/2010    Priority: Medium  . FH: colon cancer 09/23/2010    Priority: Medium  . Personal history of colonic polyps 09/23/2010    Priority: Medium  . Diabetic gastroparesis ([place]) 02/27/2007    Priority: Medium  . Diabetic autonomic neuropathy associated with type 2 diabetes mellitus ([place]) 10/22/2006    Priority: Medium  . Fibroma of tongue 06/06/2016    Priority: Low  . Abnormal stress test   . Family history of malignant neoplasm of endometrium 10/08/2018  . Foot pain, bilateral 07/17/2018    Allergies: Gluten meal and Nsaids  Social History: Patient  reports that she has never smoked. She has never used smokeless tobacco. She reports that she does not drink alcohol or use drugs.  Current Meds  Medication Sig  . albuterol (PROVENTIL HFA;VENTOLIN HFA) 108 (90 Base) MCG/ACT inhaler Inhale 2 puffs into the lungs every 6 (six) hours as needed for wheezing or shortness of breath.   Marland Kitchen atorvastatin (LIPITOR) 40 MG tablet Take 1 tablet (40 mg total) by mouth daily.  . BD INSULIN SYRINGE U/F 31G X 5/16" 1 ML MISC USE UP TO 3 TIMES A DAY  . DEXILANT 60 MG capsule TAKE 1 CAPSULE BY MOUTH EVERY DAY  . Fluticasone-Salmeterol (ADVAIR DISKUS) 100-50 MCG/DOSE AEPB Inhale 1 puff into the lungs 2 (two) times daily as  needed (for shortness of breath or wheezing).  . gabapentin (NEURONTIN) 600 MG tablet Take one AM, take two qhs (Patient taking differently: Take 600-1,200 mg by mouth See admin instructions. Take 600 mg by mouth in the morning and take 1200 mg by mouth at bedtime)  . glucose blood (ONE TOUCH ULTRA TEST) test strip 1 each by Other route 2 (two) times daily. And lancets 2/day  . ibuprofen (ADVIL) 200 MG tablet Take 200 mg by mouth daily.  . insulin NPH-regular Human (HUMULIN 70/30) (70-30) 100 UNIT/ML injection 95 UNITS WITH BREAKFAST AND 35 UNITS WITH EVENING MEAL.  Marland Kitchen olmesartan-hydrochlorothiazide (  BENICAR HCT) 40-12.5 MG tablet Take 1 tablet by mouth daily.  . polyethylene glycol powder ([employer] PURELAX) 17 GM/SCOOP powder DISSOLVE 1 CAPFUL IN AT LEAST 8 OUNCES WATER/JUICE AND DRINK TWICE DAILY  . sucralfate (CARAFATE) 1 GM/10ML suspension Take 10 mLs (1 g total) by mouth 4 (four) times daily.  . valACYclovir (VALTREX) 500 MG tablet Take 1 tablet (500 mg total) by mouth daily.    Review of Systems: Cardiovascular: negative for chest pain, palpitations, leg swelling, orthopnea Respiratory: negative for SOB, wheezing or persistent cough Gastrointestinal: negative for abdominal pain Genitourinary: negative for dysuria or gross hematuria  Objective  Vitals: BP 124/68   Pulse 74   Temp (!) 97.3 F (36.3 C) (Tympanic)   Resp 16   Ht 5' 5"  (1.651 m)   Wt 250 lb 9.6 oz (113.7 kg)   SpO2 98%   BMI 41.70 kg/m  General: no acute distress  Psych:  Alert and oriented, normal mood and affect HEENT:  Normocephalic, atraumatic, supple neck  Cardiovascular:  RRR without murmur. no edema Respiratory:  Good breath sounds bilaterally, CTAB with normal respiratory effort Skin:  Warm, no rashes Right great toe: minimal redness at very edge of medial toenail with mild ingrowing.   Commons side effects, risks, benefits, and alternatives for medications and treatment plan prescribed today were discussed,  and the patient expressed understanding of the given instructions. Patient is instructed to call or message via MyChart if he/she has any questions or concerns regarding our treatment plan. No barriers to understanding were identified. We discussed Red Flag symptoms and signs in detail. Patient expressed understanding regarding what to do in case of urgent or emergency type symptoms.   Medication list was reconciled, printed and provided to the patient in AVS. Patient instructions and summary information was reviewed with the patient as documented in the AVS. This note was prepared with assistance of Dragon voice recognition software. Occasional wrong-word or sound-a-like substitutions may have occurred due to the inherent limitations of voice recognition software

## 2019-01-16 NOTE — Patient Instructions (Signed)
Please return in 6 months for follow up of your hypertension. You may schedule an office visit for skin tag removal whenever it is convenient for you. Afternoon appointment would be best time for me.   If you have any questions or concerns, please don't hesitate to send me a message via MyChart or call the office at [phone]. Thank you for visiting with Jessica [name] today! It's our pleasure caring for you.  Glad you are well.  See your podiatrist.   Today you were given your flu vaccination.

## 2019-01-16 NOTE — Progress Notes (Signed)
Subjective:   Jessica [name] is a 68 y.o. female who presents for Medicare Annual (Subsequent) preventive examination.  Review of Systems:   Cardiac Risk Factors include: advanced age (>43mn, >>58women);diabetes mellitus;hypertension;sedentary lifestyle     Objective:     Vitals: BP 124/68   Ht 5' 5"  (1.651 m)   Wt 250 lb 10.6 oz (113.7 kg)   BMI 41.71 kg/m   Body mass index is 41.71 kg/m.  Advanced Directives 01/16/2019 10/31/2018 10/25/2017 01/07/2016 11/10/2015 11/04/2015 03/20/2013  Does Patient Have a Medical Advance Directive? No No No No No No Patient does not have advance directive  Would patient like information on creating a medical advance directive? Yes (MAU/Ambulatory/Procedural Areas - Information given) No - Patient declined - No - patient declined information Yes - EScientist, clinical (histocompatibility and immunogenetics)given Yes - EScientist, clinical (histocompatibility and immunogenetics)given -    Tobacco Social History   Tobacco Use  Smoking Status Never Smoker  Smokeless Tobacco Never Used     Counseling given: Not Answered   Clinical Intake:  Pre-visit preparation completed: Yes  Pain : No/denies pain     Diabetes: Yes CBG done?: No Did pt. bring in CBG monitor from home?: No(reports fasting today as 118)  How often do you need to have someone help you when you read instructions, pamphlets, or other written materials from your doctor or pharmacy?: 1 - Never  Interpreter Needed?: No  Information entered by :: CDenman GeorgeLPN  Past Medical History:  Diagnosis Date  . Anxiety   . Arthritis    bilateral knees  . Asthma    childhood  . Autonomic neuropathy    diabetic  . Cataract   . Celiac disease   . Depression   . Diabetes mellitus    type II, Hemoglobin A1C 9.9 10/05/2011  . Diabetic neuropathy ([place]   . Diverticulosis   . Fatty liver   . Gastroparesis   . GERD (gastroesophageal reflux disease)   . Hyperlipidemia   . Hypertension   . IBS (irritable bowel syndrome)   . Personal history of  colonic polyps 03/2010   hyperplastic  . Pneumonia 11/03/2015   current   Past Surgical History:  Procedure Laterality Date  . APPENDECTOMY    . CATARACT EXTRACTION Right 04/29/2018  . CATARACT EXTRACTION Left 03/2018  . COLONOSCOPY    . DILATATION & CURRETTAGE/HYSTEROSCOPY WITH RESECTOCOPE N/A 03/24/2013   Procedure: DILATATION & CURETTAGE, HYSTEROSCOPY WITH RESECTION;  Surgeon: MOlga Millers MD;  [facility];  Service: Gynecology;  Laterality: N/A;  . HYSTEROSCOPY W/D&C N/A 11/08/2015   Procedure: DILATATION AND CURETTAGE /HYSTEROSCOPY;  Surgeon: MOlga Millers MD;  [facility];  Service: Gynecology;  Laterality: N/A;  . left breast cyst removal  2000  . LEFT HEART CATH AND CORONARY ANGIOGRAPHY N/A 10/31/2018   Procedure: LEFT HEART CATH AND CORONARY ANGIOGRAPHY;  Surgeon: VJettie Booze MD;  [facility];  Service: Cardiovascular;  Laterality: N/A;  . TUBAL LIGATION     Family History  Problem Relation Age of Onset  . Hypertension Mother   . Colon cancer Sister        dx in her early 648s . Diabetes Sister   . Endometrial cancer Sister   . Hypertension Brother   . Other Father        2 collapsed lungs  . COPD Father   . Diabetes Father   . Heart attack Father   . Heart failure Father  Social History   Socioeconomic History  . Marital status: Married    Spouse name: Devar Halabi  . Number of children: 2  . Years of education: Not on file  . Highest education level: Not on file  Occupational History  . 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 Financial resource strain: Not on file  . Food insecurity    Worry: Not on file    Inability: Not on file  . Transportation needs    Medical: Not on file    Non-medical: Not on file  Tobacco Use  . Smoking status: Never Smoker  . Smokeless tobacco: Never Used  Substance and Sexual Activity  . Alcohol use: No  . Drug  use: No  . Sexual activity: Yes    Birth control/protection: Post-menopausal, Surgical  Lifestyle  . Physical activity    Days per week: Not on file    Minutes per session: Not on file  . Stress: Not on file  Relationships  . Social Herbalist on phone: Not on file    Gets together: Not on file    Attends religious service: Not on file    Active member of club or organization: Not on file    Attends meetings of clubs or organizations: Not on file    Relationship status: Not on file  Other Topics Concern  . Not on file  Social History Narrative  . Not on file    Outpatient Encounter Medications as of 01/16/2019  Medication Sig  . albuterol (PROVENTIL HFA;VENTOLIN HFA) 108 (90 Base) MCG/ACT inhaler Inhale 2 puffs into the lungs every 6 (six) hours as needed for wheezing or shortness of breath.   Marland Kitchen atorvastatin (LIPITOR) 40 MG tablet Take 1 tablet (40 mg total) by mouth daily.  . BD INSULIN SYRINGE U/F 31G X 5/16" 1 ML MISC USE UP TO 3 TIMES A DAY  . DEXILANT 60 MG capsule TAKE 1 CAPSULE BY MOUTH EVERY DAY  . Fluticasone-Salmeterol (ADVAIR DISKUS) 100-50 MCG/DOSE AEPB Inhale 1 puff into the lungs 2 (two) times daily as needed (for shortness of breath or wheezing).  . gabapentin (NEURONTIN) 600 MG tablet Take one AM, take two qhs (Patient taking differently: Take 600-1,200 mg by mouth See admin instructions. Take 600 mg by mouth in the morning and take 1200 mg by mouth at bedtime)  . glucose blood (ONE TOUCH ULTRA TEST) test strip 1 each by Other route 2 (two) times daily. And lancets 2/day  . ibuprofen (ADVIL) 200 MG tablet Take 200 mg by mouth daily.  . insulin NPH-regular Human (HUMULIN 70/30) (70-30) 100 UNIT/ML injection 95 UNITS WITH BREAKFAST AND 35 UNITS WITH EVENING MEAL.  Marland Kitchen olmesartan-hydrochlorothiazide (BENICAR HCT) 40-12.5 MG tablet Take 1 tablet by mouth daily.  . polyethylene glycol powder ([employer] PURELAX) 17 GM/SCOOP powder DISSOLVE 1 CAPFUL IN AT LEAST 8 OUNCES  WATER/JUICE AND DRINK TWICE DAILY  . sucralfate (CARAFATE) 1 GM/10ML suspension Take 10 mLs (1 g total) by mouth 4 (four) times daily.  . valACYclovir (VALTREX) 500 MG tablet Take 1 tablet (500 mg total) by mouth daily.   No facility-administered encounter medications on file as of 01/16/2019.     Activities of Daily Living In your present state of health, do you have any difficulty performing the following activities: 01/16/2019 01/16/2019  Hearing? N N  Vision? N Y  Difficulty concentrating or making decisions? N N  Walking or climbing stairs? N N  Dressing or bathing? N N  Doing errands, shopping? N N  Preparing Food and eating ? N -  Using the Toilet? N -  In the past six months, have you accidently leaked urine? N -  Do you have problems with loss of bowel control? N -  Managing your Medications? N -  Managing your Finances? N -  Housekeeping or managing your Housekeeping? N -  Some recent data might be hidden    Patient Care Team: [name], MD as PCP - General (Family Medicine) Jettie Booze, MD as PCP - Cardiology (Cardiology) Olga Millers, MD as Attending Physician (Obstetrics and Gynecology) Renato Shin, MD as Consulting Physician (Endocrinology) Pyrtle, Lajuan Lines, MD as Consulting Physician (Gastroenterology) Stephannie Li, Snyder as Consulting Physician (Ophthalmology) Garrel Ridgel, DPM as Consulting Physician (Podiatry)    Assessment:   This is a routine wellness examination for Alanii.  Exercise Activities and Dietary recommendations Current Exercise Habits: The patient does not participate in regular exercise at present  Goals    . Increase physical activity     Would like to increase activity to increase energy level        Fall Risk Fall Risk  01/16/2019 01/16/2019 02/14/2018  Falls in the past year? 0 0 No  Number falls in past yr: 0 0 -  Injury with Fall? 0 0 -  Follow up Education provided;Falls evaluation completed Falls evaluation  completed -   Is the patient's home free of loose throw rugs in walkways, pet beds, electrical cords, etc?   yes      Grab bars in the bathroom? yes      Handrails on the stairs?   yes      Adequate lighting?   yes  Timed Get Up and Go performed: completed and within normal timeframe; no gait abnormalities noted    Depression Screen PHQ 2/9 Scores 01/16/2019 02/14/2018  PHQ - 2 Score 0 2  PHQ- 9 Score - 5     Cognitive Function-no cognitive concerns at this time      6CIT Screen 01/16/2019  What Year? 0 points  What month? 0 points  What time? 0 points  Count back from 20 0 points  Months in reverse 0 points  Repeat phrase 0 points  Total Score 0    Immunization History  Administered Date(s) Administered  . Influenza,inj,Quad PF,6+ Mos 02/14/2018  . Pneumococcal Conjugate-13 11/03/2015  . Pneumococcal Polysaccharide-23 08/17/2010, 02/14/2018  . Td 12/31/2002  . Tdap 10/25/2017  . Zoster Recombinat (Shingrix) 11/05/2018    Qualifies for Shingles Vaccine? Patient has started Shingrix series   Screening Tests Health Maintenance  Topic Date Due  . INFLUENZA VACCINE  11/30/2018  . MAMMOGRAM  11/30/2018  . HEMOGLOBIN A1C  05/23/2019  . FOOT EXAM  07/17/2019  . COLONOSCOPY  08/25/2019  . OPHTHALMOLOGY EXAM  10/02/2019  . DEXA SCAN  10/31/2019  . TETANUS/TDAP  10/26/2027  . Hepatitis C Screening  Completed  . PNA vac Low Risk Adult  Completed    Cancer Screenings: Lung: Low Dose CT Chest recommended if Age 40-80 years, 30 pack-year currently smoking OR have quit w/in 15years. Patient does not qualify. Breast:  Up to date on Mammogram? Yes; ordered     Up to date of Bone Density/Dexa? Yes Colorectal: completed 08/25/14 with Dr. Hilarie Fredrickson    Plan:   I have personally reviewed and addressed the Medicare Annual Wellness questionnaire and  have noted the following in the patient's chart:  A. Medical and social history B. Use of alcohol, tobacco or illicit drugs  C.  Current medications and supplements D. Functional ability and status E.  Nutritional status F.  Physical activity G. Advance directives H. List of other physicians I.  Hospitalizations, surgeries, and ER visits in previous 12 months J.  [facility] such as hearing and vision if needed, cognitive and depression L. Referrals, records requested, and appointments- none   In addition, I have reviewed and discussed with patient certain preventive protocols, quality metrics, and best practice recommendations. A written personalized care plan for preventive services as well as general preventive health recommendations were provided to patient.   Signed,  Denman George, LPN  Nurse Health Advisor   Nurse Notes: Patient would like to know if referral to [facility] would be appropriate

## 2019-01-27 ENCOUNTER — Other Ambulatory Visit: Payer: Self-pay | Admitting: Podiatry

## 2019-01-28 ENCOUNTER — Other Ambulatory Visit: Payer: Self-pay | Admitting: Endocrinology

## 2019-01-28 NOTE — Telephone Encounter (Signed)
Please advise 

## 2019-01-28 NOTE — Telephone Encounter (Signed)
Per Dr. Cordelia Pen request, I am forwarding you this refill request. Please review and refill if appropriate

## 2019-01-28 NOTE — Telephone Encounter (Signed)
Please forward refill request to pt's primary care provider.   

## 2019-01-29 ENCOUNTER — Other Ambulatory Visit: Payer: Self-pay

## 2019-01-29 ENCOUNTER — Encounter: Payer: Self-pay | Admitting: Family Medicine

## 2019-01-29 ENCOUNTER — Ambulatory Visit (INDEPENDENT_AMBULATORY_CARE_PROVIDER_SITE_OTHER): Payer: Medicare Other | Admitting: Family Medicine

## 2019-01-29 VITALS — BP 112/56 | HR 81 | Temp 97.3°F | Resp 16 | Ht 65.0 in | Wt 248.0 lb

## 2019-01-29 DIAGNOSIS — L918 Other hypertrophic disorders of the skin: Secondary | ICD-10-CM

## 2019-01-29 NOTE — Progress Notes (Signed)
Skin Tag Removal Procedure Note  Pre-operative Diagnosis: Classic skin tags (acrochordon)  Post-operative Diagnosis: Classic skin tags (acrochordon)  Locations:neck, chest, back and bra line  Indications: irritation and pain with jewelry and clothing  Anesthesia: Lidocaine 1% with epinephrine without added sodium bicarbonate used only on a few of the skin tags  Procedure Details  The risks (including bleeding and infection) and benefits of the procedure and Verbal informed consent obtained. Using sterile iris scissors, multiple skin tags were snipped off at their bases after cleansing with alcohol.  Bleeding was controlled by pressure.  A total of about 50 skin tags were removed.   Findings: Pathognomonic benign lesions  not sent for pathological exam.  Condition: Stable  Complications: none.  Plan: 1. Instructed to keep the wounds dry and covered for 24-48h and clean thereafter. 2. Warning signs of infection were reviewed.   3. Recommended that the patient use tylenol if needed. 4. Return as needed.

## 2019-01-29 NOTE — Patient Instructions (Signed)
Skin Tag, Adult  A skin tag (acrochordon) is a soft, extra growth of skin. Most skin tags are flesh-colored and rarely bigger than a pencil eraser. They commonly form near areas where there are folds in the skin, such as the armpit or groin. Skin tags are not dangerous, and they do not spread from person to person (are not contagious). You may have one skin tag or several. Skin tags do not require treatment. However, your health care provider may recommend removal of a skin tag if it:  Gets irritated from clothing.  Bleeds.  Is visible and unsightly. Your health care provider can remove skin tags with a simple surgical procedure or a procedure that involves freezing the skin tag. Follow these instructions at home:  Watch for any changes in your skin tag. A normal skin tag does not require any other special care at home.  Take over-the-counter and prescription medicines only as told by your health care provider.  Keep all follow-up visits as told by your health care provider. This is important. Contact a health care provider if:  You have a skin tag that: ? Becomes painful. ? Changes color. ? Bleeds. ? Swells.  You develop more skin tags. This information is not intended to replace advice given to you by your health care provider. Make sure you discuss any questions you have with your health care provider. Document Released: 05/02/2015 Document Revised: 03/30/2017 Document Reviewed: 05/02/2015 Elsevier Patient Education  2020 [employer].

## 2019-02-10 DIAGNOSIS — Z1231 Encounter for screening mammogram for malignant neoplasm of breast: Secondary | ICD-10-CM | POA: Diagnosis not present

## 2019-02-10 DIAGNOSIS — Z124 Encounter for screening for malignant neoplasm of cervix: Secondary | ICD-10-CM | POA: Diagnosis not present

## 2019-02-12 ENCOUNTER — Ambulatory Visit (INDEPENDENT_AMBULATORY_CARE_PROVIDER_SITE_OTHER): Payer: Medicare Other | Admitting: Family Medicine

## 2019-02-12 ENCOUNTER — Other Ambulatory Visit: Payer: Self-pay

## 2019-02-12 ENCOUNTER — Encounter: Payer: Self-pay | Admitting: Family Medicine

## 2019-02-12 VITALS — BP 138/84 | HR 76 | Ht 65.0 in | Wt 248.0 lb

## 2019-02-12 DIAGNOSIS — M5030 Other cervical disc degeneration, unspecified cervical region: Secondary | ICD-10-CM

## 2019-02-12 MED ORDER — TIZANIDINE HCL 4 MG PO CAPS: ORAL_CAPSULE | ORAL | 0 refills | Status: DC

## 2019-02-12 MED ORDER — METHYLPREDNISOLONE ACETATE 80 MG/ML IJ SUSP
80.0000 mg | Freq: Once | INTRAMUSCULAR | Status: AC
  Administered 2019-02-12: 80 mg via INTRAMUSCULAR

## 2019-02-12 MED ORDER — KETOROLAC TROMETHAMINE 60 MG/2ML IM SOLN
60.0000 mg | Freq: Once | INTRAMUSCULAR | Status: AC
  Administered 2019-02-12: 60 mg via INTRAMUSCULAR

## 2019-02-12 NOTE — Patient Instructions (Signed)
zanaflex at night Exercise 3 times a week See me again in 3-4 weeks

## 2019-02-12 NOTE — Progress Notes (Signed)
[name] [place], [place] [address] Phone: [phone] Subjective:   Jessica [name], am serving as a scribe for Dr. Hulan Saas.   CC: Neck and shoulder pain  [id number]  Jessica [name] is a 68 y.o. female coming in with complaint of shoulder pain. Last seen in May 2019 for back pain. Having right shoulder and neck pain. Has been using her arm a lot more lifting things for 2 weeks. Pain in scapula. Pain is dull, constant, ache. Has tried heat, ice, and massage. Has been using Advil for pain. Does have right sided numbness in hand and arm. Does have a pillow that she uses to reduce numbness. Uses gabapentin 638m.  Patient continues noted to have pain on a regular basis.  Last time we saw her for her neck was greater than 3 years ago.  Patient states this does feel like an exacerbation.  Mild radiation down the arm.  Past Medical History:  Diagnosis Date  . Anxiety   . Arthritis    bilateral knees  . Asthma    childhood  . Autonomic neuropathy    diabetic  . Cataract   . Celiac disease   . Depression   . Diabetes mellitus    type II, Hemoglobin A1C 9.9 10/05/2011  . Diabetic neuropathy ([place]   . Diverticulosis   . Fatty liver   . Gastroparesis   . GERD (gastroesophageal reflux disease)   . Hyperlipidemia   . Hypertension   . IBS (irritable bowel syndrome)   . Personal history of colonic polyps 03/2010   hyperplastic  . Pneumonia 11/03/2015   current   Past Surgical History:  Procedure Laterality Date  . APPENDECTOMY    . CATARACT EXTRACTION Right 04/29/2018  . CATARACT EXTRACTION Left 03/2018  . COLONOSCOPY    . DILATATION & CURRETTAGE/HYSTEROSCOPY WITH RESECTOCOPE N/A 03/24/2013   Procedure: DILATATION & CURETTAGE, HYSTEROSCOPY WITH RESECTION;  Surgeon: MOlga Millers MD;  [facility];  Service: Gynecology;  Laterality: N/A;  . HYSTEROSCOPY W/D&C N/A 11/08/2015   Procedure: DILATATION AND CURETTAGE  /HYSTEROSCOPY;  Surgeon: MOlga Millers MD;  [facility];  Service: Gynecology;  Laterality: N/A;  . left breast cyst removal  2000  . LEFT HEART CATH AND CORONARY ANGIOGRAPHY N/A 10/31/2018   Procedure: LEFT HEART CATH AND CORONARY ANGIOGRAPHY;  Surgeon: VJettie Booze MD;  [facility];  Service: Cardiovascular;  Laterality: N/A;  . TUBAL LIGATION     Social History   Socioeconomic History  . Marital status: Married    Spouse name: Devar Eifert  . Number of children: 2  . Years of education: Not on file  . Highest education level: Not on file  Occupational History  . 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 Financial resource strain: Not on file  . Food insecurity    Worry: Not on file    Inability: Not on file  . Transportation needs    Medical: Not on file    Non-medical: Not on file  Tobacco Use  . Smoking status: Never Smoker  . Smokeless tobacco: Never Used  Substance and Sexual Activity  . Alcohol use: [name]  . Drug use: [name]  . Sexual activity: Yes    Birth control/protection: Post-menopausal, Surgical  Lifestyle  . Physical activity    Days per week: Not on file  Minutes per session: Not on file  . Stress: Not on file  Relationships  . Social Herbalist on phone: Not on file    Gets together: Not on file    Attends religious service: Not on file    Active member of club or organization: Not on file    Attends meetings of clubs or organizations: Not on file    Relationship status: Not on file  Other Topics Concern  . Not on file  Social History Narrative  . Not on file   Allergies  Allergen Reactions  . Gluten Meal Other (See Comments)    [name] wheat or soy  . Nsaids Nausea And Vomiting and Other (See Comments)    Cannot tolerate large doses    Family History  Problem Relation Age of Onset  . Hypertension Mother   . Colon cancer Sister        dx in  her early 30s  . Diabetes Sister   . Endometrial cancer Sister   . Hypertension Brother   . Other Father        2 collapsed lungs  . COPD Father   . Diabetes Father   . Heart attack Father   . Heart failure Father     Current Outpatient Medications (Endocrine & Metabolic):  .  insulin NPH-regular Human (HUMULIN 70/30) (70-30) 100 UNIT/ML injection, 95 UNITS WITH BREAKFAST AND 35 UNITS WITH EVENING MEAL.  Current Outpatient Medications (Cardiovascular):  .  atorvastatin (LIPITOR) 40 MG tablet, Take 1 tablet (40 mg total) by mouth daily. Marland Kitchen  olmesartan-hydrochlorothiazide (BENICAR HCT) 40-12.5 MG tablet, TAKE 1 TABLET BY MOUTH EVERY DAY  Current Outpatient Medications (Respiratory):  .  albuterol (PROVENTIL HFA;VENTOLIN HFA) 108 (90 Base) MCG/ACT inhaler, Inhale 2 puffs into the lungs every 6 (six) hours as needed for wheezing or shortness of breath.  .  Fluticasone-Salmeterol (ADVAIR DISKUS) 100-50 MCG/DOSE AEPB, Inhale 1 puff into the lungs 2 (two) times daily as needed (for shortness of breath or wheezing).  Current Outpatient Medications (Analgesics):  .  ibuprofen (ADVIL) 200 MG tablet, Take 200 mg by mouth daily.   Current Outpatient Medications (Other):  .  BD INSULIN SYRINGE U/F 31G X 5/16" 1 ML MISC, USE UP TO 3 TIMES A DAY .  DEXILANT 60 MG capsule, TAKE 1 CAPSULE BY MOUTH EVERY DAY .  gabapentin (NEURONTIN) 600 MG tablet, TAKE 1 TABLET IN THE MORNING AND 2 TABLETS AT BEDTIME .  glucose blood (ONE TOUCH ULTRA TEST) test strip, 1 each by Other route 2 (two) times daily. And lancets 2/day .  polyethylene glycol powder ([employer] PURELAX) 17 GM/SCOOP powder, DISSOLVE 1 CAPFUL IN AT LEAST 8 OUNCES WATER/JUICE AND DRINK TWICE DAILY .  sucralfate (CARAFATE) 1 GM/10ML suspension, Take 10 mLs (1 g total) by mouth 4 (four) times daily. .  valACYclovir (VALTREX) 500 MG tablet, Take 1 tablet (500 mg total) by mouth daily. Marland Kitchen  tiZANidine (ZANAFLEX) 4 MG capsule, 1 tablet at night    Past  medical history, social, surgical and family history all reviewed in electronic medical record.  [name] pertanent information unless stated regarding to the chief complaint.   Review of Systems:  [name] headache, visual changes, nausea, vomiting, diarrhea, constipation, dizziness, abdominal pain, skin rash, fevers, chills, night sweats, weight loss, swollen lymph nodes, body aches, joint swelling,chest pain, shortness of breath, mood changes.  Positive muscle aches  Objective  Blood pressure 138/84, pulse 76, height 5' 5"  (1.651 m), weight 248  lb (112.5 kg), SpO2 98 %.    General: [name] apparent distress alert and oriented x3 mood and affect normal, dressed appropriately.  HEENT: Pupils equal, extraocular movements intact  Respiratory: Patient's speak in full sentences and does not appear short of breath  Cardiovascular: [name] lower extremity edema, non tender, [name] erythema  Skin: Warm dry intact with [name] signs of infection or rash on extremities or on axial skeleton.  Abdomen: Soft nontender  Neuro: Cranial nerves II through XII are intact, neurovascularly intact in all extremities with 2+ DTRs and 2+ pulses.  Lymph: [name] lymphadenopathy of posterior or anterior cervical chain or axillae bilaterally.  Gait normal with good balance and coordination.  MSK:  tender with limited range of motion and stability and symmetric strength and tone of shoulders, elbows, wrist, hip, knee and ankles bilaterally.  Neck exam significant loss of lordosis.  The patient does have a positive Spurling's with radicular symptoms down the right arm.  Decreased range of motion in all planes of 10 to 15 degrees.    Impression and Recommendations:     This case required medical decision making of moderate complexity. The above documentation has been reviewed and is accurate and complete Lyndal Pulley, DO       Note: This dictation was prepared with Dragon dictation along with smaller phrase technology. Any transcriptional  errors that result from this process are unintentional.

## 2019-02-13 ENCOUNTER — Other Ambulatory Visit: Payer: Self-pay | Admitting: Internal Medicine

## 2019-02-18 ENCOUNTER — Telehealth: Payer: Self-pay | Admitting: *Deleted

## 2019-02-18 NOTE — Telephone Encounter (Signed)
Pt left msg stating that she saw Dr. Tamala Julian last week for R shoulder pain. Pt states she has been doing the exercises and taking the muscle relaxer but is still in a lot of pain. Pt would like to know if there is anything else that Dr. Tamala Julian recommends.   Pt gave the okay for a detailed msg to be left on vmail.

## 2019-02-18 NOTE — Telephone Encounter (Signed)
Talked to patient. She will try muscle relaxer as well as ice and heat.

## 2019-02-18 NOTE — Telephone Encounter (Signed)
Can take muscle relaxer during the day if no side effects up to every 8 hours

## 2019-02-19 ENCOUNTER — Other Ambulatory Visit: Payer: Self-pay | Admitting: Endocrinology

## 2019-02-20 ENCOUNTER — Ambulatory Visit (INDEPENDENT_AMBULATORY_CARE_PROVIDER_SITE_OTHER): Payer: Medicare Other | Admitting: Endocrinology

## 2019-02-20 ENCOUNTER — Other Ambulatory Visit: Payer: Self-pay

## 2019-02-20 ENCOUNTER — Encounter: Payer: Self-pay | Admitting: Endocrinology

## 2019-02-20 VITALS — BP 122/50 | HR 88 | Ht 65.0 in | Wt 248.4 lb

## 2019-02-20 DIAGNOSIS — E1143 Type 2 diabetes mellitus with diabetic autonomic (poly)neuropathy: Secondary | ICD-10-CM | POA: Diagnosis not present

## 2019-02-20 DIAGNOSIS — Z794 Long term (current) use of insulin: Secondary | ICD-10-CM | POA: Diagnosis not present

## 2019-02-20 LAB — POCT GLYCOSYLATED HEMOGLOBIN (HGB A1C): Hemoglobin A1C: 7.2 % — AB (ref 4.0–5.6)

## 2019-02-20 MED ORDER — HUMULIN 70/30 (70-30) 100 UNIT/ML ~~LOC~~ SUSP: SUBCUTANEOUS | 5 refills | Status: DC

## 2019-02-20 NOTE — Patient Instructions (Addendum)
Please reduce the insulin to 95 units with breakfast, and 32 units with supper.    check your blood sugar twice a day.  vary the time of day when you check, between before the 3 meals, and at bedtime.  also check if you have symptoms of your blood sugar being too high or too low.  please keep a record of the readings and bring it to your next appointment here (or you can bring the meter itself).  You can write it on any piece of paper.  please call us sooner if your blood sugar goes below 70, or if you have a lot of readings over 200.  Please come back for a follow-up appointment in 3 months.

## 2019-02-20 NOTE — Progress Notes (Signed)
Subjective:    Patient ID: Jessica [name], female    DOB: 09-25-1950, 68 y.o.   MRN: [id number]  HPI Pt returns for f/u of diabetes mellitus:  DM type: Insulin-requiring type 2. Dx'ed: 2000.  Complications: polyneuropathy and autonomic neuropathy.   Therapy: insulin since 2005 GDM: never DKA: never Severe hypoglycemia: never.  Pancreatitis: never.  Other: her ability to care for her DM is compromised by her husband's illness; she has done better on a BID insulin regimen; she takes human insulin, due to cost.   Interval history: no cbg record, but states cbg's vary from 55-200.  It is still lowest in the middle of the night.  pt states she feels well in general.   Past Medical History:  Diagnosis Date  . Anxiety   . Arthritis    bilateral knees  . Asthma    childhood  . Autonomic neuropathy    diabetic  . Cataract   . Celiac disease   . Depression   . Diabetes mellitus    type II, Hemoglobin A1C 9.9 10/05/2011  . Diabetic neuropathy ([place])   . Diverticulosis   . Fatty liver   . Gastroparesis   . GERD (gastroesophageal reflux disease)   . Hyperlipidemia   . Hypertension   . IBS (irritable bowel syndrome)   . Personal history of colonic polyps 03/2010   hyperplastic  . Pneumonia 11/03/2015   current    Past Surgical History:  Procedure Laterality Date  . APPENDECTOMY    . CATARACT EXTRACTION Right 04/29/2018  . CATARACT EXTRACTION Left 03/2018  . COLONOSCOPY    . DILATATION & CURRETTAGE/HYSTEROSCOPY WITH RESECTOCOPE N/A 03/24/2013   Procedure: DILATATION & CURETTAGE, HYSTEROSCOPY WITH RESECTION;  Surgeon: Olga Millers, MD;  Location: [facility];  Service: Gynecology;  Laterality: N/A;  . HYSTEROSCOPY W/D&C N/A 11/08/2015   Procedure: DILATATION AND CURETTAGE /HYSTEROSCOPY;  Surgeon: Olga Millers, MD;  Location: [facility];  Service: Gynecology;  Laterality: N/A;  . left breast cyst removal  2000  . LEFT HEART CATH AND CORONARY ANGIOGRAPHY N/A 10/31/2018   Procedure:  LEFT HEART CATH AND CORONARY ANGIOGRAPHY;  Surgeon: Jettie Booze, MD;  Location: [facility];  Service: Cardiovascular;  Laterality: N/A;  . TUBAL LIGATION      Social History   Socioeconomic History  . Marital status: Married    Spouse name: Devar Bentson  . Number of children: 2  . Years of education: Not on file  . Highest education level: Not on file  Occupational History  . 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 Financial resource strain: Not on file  . Food insecurity    Worry: Not on file    Inability: Not on file  . Transportation needs    Medical: Not on file    Non-medical: Not on file  Tobacco Use  . Smoking status: Never Smoker  . Smokeless tobacco: Never Used  Substance and Sexual Activity  . Alcohol use: No  . Drug use: No  . Sexual activity: Yes    Birth control/protection: Post-menopausal, Surgical  Lifestyle  . Physical activity    Days per week: Not on file    Minutes per session: Not on file  . Stress: Not on file  Relationships  . Social Herbalist on phone: Not on file    Gets together: Not on file    Attends  religious service: Not on file    Active member of club or organization: Not on file    Attends meetings of clubs or organizations: Not on file    Relationship status: Not on file  . Intimate partner violence    Fear of current or ex partner: Not on file    Emotionally abused: Not on file    Physically abused: Not on file    Forced sexual activity: Not on file  Other Topics Concern  . Not on file  Social History Narrative  . Not on file    Current Outpatient Medications on File Prior to Visit  Medication Sig Dispense Refill  . albuterol (PROVENTIL HFA;VENTOLIN HFA) 108 (90 Base) MCG/ACT inhaler Inhale 2 puffs into the lungs every 6 (six) hours as needed for wheezing or shortness of breath.     Marland Kitchen atorvastatin (LIPITOR) 40 MG tablet Take 1 tablet  (40 mg total) by mouth daily. 90 tablet 1  . BD INSULIN SYRINGE U/F 31G X 5/16" 1 ML MISC USE UP TO 3 TIMES A DAY 90 each 2  . dexlansoprazole (DEXILANT) 60 MG capsule Take 1 capsule (60 mg total) by mouth daily. NEEDS OFFICE VISIT FOR FURTHER REFILLS! 90 capsule 0  . Fluticasone-Salmeterol (ADVAIR DISKUS) 100-50 MCG/DOSE AEPB Inhale 1 puff into the lungs 2 (two) times daily as needed (for shortness of breath or wheezing). 180 each 3  . gabapentin (NEURONTIN) 600 MG tablet TAKE 1 TABLET IN THE MORNING AND 2 TABLETS AT BEDTIME 270 tablet 2  . glucose blood (ONE TOUCH ULTRA TEST) test strip 1 each by Other route 2 (two) times daily. And lancets 2/day 200 each 3  . ibuprofen (ADVIL) 200 MG tablet Take 200 mg by mouth daily.    Marland Kitchen olmesartan-hydrochlorothiazide (BENICAR HCT) 40-12.5 MG tablet TAKE 1 TABLET BY MOUTH EVERY DAY 90 tablet 3  . polyethylene glycol powder ([employer] PURELAX) 17 GM/SCOOP powder DISSOLVE 1 CAPFUL IN AT LEAST 8 OUNCES WATER/JUICE AND DRINK TWICE DAILY 1020 g 2  . sucralfate (CARAFATE) 1 GM/10ML suspension Take 10 mLs (1 g total) by mouth 4 (four) times daily. 420 mL 1  . tiZANidine (ZANAFLEX) 4 MG capsule 1 tablet at night 30 capsule 0  . valACYclovir (VALTREX) 500 MG tablet Take 1 tablet (500 mg total) by mouth daily. 90 tablet 3   No current facility-administered medications on file prior to visit.     Allergies  Allergen Reactions  . Gluten Meal Other (See Comments)    No wheat or soy  . Nsaids Nausea And Vomiting and Other (See Comments)    Cannot tolerate large doses     Family History  Problem Relation Age of Onset  . Hypertension Mother   . Colon cancer Sister        dx in her early 63s  . Diabetes Sister   . Endometrial cancer Sister   . Hypertension Brother   . Other Father        2 collapsed lungs  . COPD Father   . Diabetes Father   . Heart attack Father   . Heart failure Father     BP (!) 122/50 (BP Location: Left Arm, Patient Position: Sitting, Cuff  Size: Large)   Pulse 88   Ht 5' 5"  (1.651 m)   Wt 248 lb 6.4 oz (112.7 kg)   SpO2 96%   BMI 41.34 kg/m    Review of Systems Denies LOC.      Objective:   Physical  Exam VITAL SIGNS:  See vs page GENERAL: no distress Pulses: dorsalis pedis intact bilat.   MSK: no deformity of the feet CV: no leg edema Skin:  no ulcer on the feet.  normal color and temp on the feet. Neuro: sensation is intact to touch on the feet.    Lab Results  Component Value Date   HGBA1C 7.2 (A) 02/20/2019       Assessment & Plan:  Insulin-requiring type 2 DM, with PN: this is the best control this pt should aim for, given this regimen, which does match insulin to her changing needs throughout the day. Hypoglycemia: this limits aggressiveness of glycemic control.   Patient Instructions  Please reduce the insulin to 95 units with breakfast, and 32 units with supper.    check your blood sugar twice a day.  vary the time of day when you check, between before the 3 meals, and at bedtime.  also check if you have symptoms of your blood sugar being too high or too low.  please keep a record of the readings and bring it to your next appointment here (or you can bring the meter itself).  You can write it on any piece of paper.  please call us sooner if your blood sugar goes below 70, or if you have a lot of readings over 200.  Please come back for a follow-up appointment in 3 months.

## 2019-03-06 ENCOUNTER — Other Ambulatory Visit: Payer: Self-pay | Admitting: Family Medicine

## 2019-03-12 ENCOUNTER — Other Ambulatory Visit: Payer: Self-pay

## 2019-03-12 ENCOUNTER — Encounter (INDEPENDENT_AMBULATORY_CARE_PROVIDER_SITE_OTHER): Payer: Self-pay | Admitting: Bariatrics

## 2019-03-12 ENCOUNTER — Ambulatory Visit (INDEPENDENT_AMBULATORY_CARE_PROVIDER_SITE_OTHER): Payer: Medicare Other | Admitting: Bariatrics

## 2019-03-12 ENCOUNTER — Encounter: Payer: Self-pay | Admitting: Bariatrics

## 2019-03-12 VITALS — BP 131/71 | HR 74 | Temp 98.5°F | Ht 65.0 in | Wt 246.0 lb

## 2019-03-12 DIAGNOSIS — K900 Celiac disease: Secondary | ICD-10-CM

## 2019-03-12 DIAGNOSIS — E1149 Type 2 diabetes mellitus with other diabetic neurological complication: Secondary | ICD-10-CM | POA: Diagnosis not present

## 2019-03-12 DIAGNOSIS — I10 Essential (primary) hypertension: Secondary | ICD-10-CM | POA: Diagnosis not present

## 2019-03-12 DIAGNOSIS — Z794 Long term (current) use of insulin: Secondary | ICD-10-CM | POA: Diagnosis not present

## 2019-03-12 DIAGNOSIS — Z6841 Body Mass Index (BMI) 40.0 and over, adult: Secondary | ICD-10-CM | POA: Diagnosis not present

## 2019-03-12 DIAGNOSIS — Z1331 Encounter for screening for depression: Secondary | ICD-10-CM | POA: Diagnosis not present

## 2019-03-12 DIAGNOSIS — R0602 Shortness of breath: Secondary | ICD-10-CM

## 2019-03-12 DIAGNOSIS — R9439 Abnormal result of other cardiovascular function study: Secondary | ICD-10-CM | POA: Diagnosis not present

## 2019-03-12 DIAGNOSIS — E7800 Pure hypercholesterolemia, unspecified: Secondary | ICD-10-CM

## 2019-03-12 DIAGNOSIS — Z0289 Encounter for other administrative examinations: Secondary | ICD-10-CM

## 2019-03-12 DIAGNOSIS — R7989 Other specified abnormal findings of blood chemistry: Secondary | ICD-10-CM | POA: Diagnosis not present

## 2019-03-12 DIAGNOSIS — R5383 Other fatigue: Secondary | ICD-10-CM | POA: Diagnosis not present

## 2019-03-12 DIAGNOSIS — G4733 Obstructive sleep apnea (adult) (pediatric): Secondary | ICD-10-CM

## 2019-03-13 ENCOUNTER — Ambulatory Visit (INDEPENDENT_AMBULATORY_CARE_PROVIDER_SITE_OTHER): Payer: Medicare Other | Admitting: Family Medicine

## 2019-03-13 ENCOUNTER — Encounter: Payer: Self-pay | Admitting: Family Medicine

## 2019-03-13 ENCOUNTER — Ambulatory Visit (INDEPENDENT_AMBULATORY_CARE_PROVIDER_SITE_OTHER)
Admission: RE | Admit: 2019-03-13 | Discharge: 2019-03-13 | Disposition: A | Discharge status: Home / Self Care | Payer: Medicare Other | Source: Ambulatory Visit | Attending: Family Medicine | Admitting: Family Medicine

## 2019-03-13 VITALS — BP 112/68 | HR 81 | Ht 65.0 in | Wt 251.0 lb

## 2019-03-13 DIAGNOSIS — M5030 Other cervical disc degeneration, unspecified cervical region: Secondary | ICD-10-CM | POA: Diagnosis not present

## 2019-03-13 DIAGNOSIS — G8929 Other chronic pain: Secondary | ICD-10-CM | POA: Diagnosis not present

## 2019-03-13 DIAGNOSIS — M542 Cervicalgia: Secondary | ICD-10-CM | POA: Diagnosis not present

## 2019-03-13 NOTE — Patient Instructions (Signed)
Great to see you  I think we are on the mend COOP pillow  (sorry hubby) Keep doing the exercises See me again in 6 weeks and if not better may need MRI

## 2019-03-13 NOTE — Progress Notes (Deleted)
Office: [phone]  /  Fax: [phone]   Dear Dr. Jonni [name],   Thank you for referring Jessica [name] to our clinic. The following note includes my evaluation and treatment recommendations.  HPI:   Chief Complaint: OBESITY    Jessica [name] has been referred by Jessica Gros L. Jonni Sanger, MD for consultation regarding her obesity and obesity related comorbidities.    Jessica [name] (MR# [id number]) is a 68 y.o. female who presents on 03/12/2019 for obesity evaluation and treatment. Current BMI is Body mass index is 40.94 kg/m. Jessica [name] has been struggling with her weight for many years and has been unsuccessful in either losing weight, maintaining weight loss, or reaching her healthy weight goal.     Jessica [name] does not like to cook. She craves ice cream. She does not like eggs and she skips meals.     Jessica [name] states she is currently in the action stage of change and ready to dedicate time achieving and maintaining a healthier weight. Jessica [name] is interested in becoming our patient and working on intensive lifestyle modifications including (but not limited to) diet, exercise and weight loss.    Jessica [name] states her desired weight loss is 58 lbs she started gaining weight in 2004 her heaviest weight ever was 260 lbs she has significant food cravings issues  she snacks frequently in the evenings she skips meals frequently she is frequently drinking liquids with calories she frequently eats larger portions than normal  she struggles with emotional eating    Jessica [name] feels her energy is lower than it should be. This has worsened with weight gain and has not worsened recently. Jessica [name] admits to daytime somnolence and  denies waking up still tired. Patient is at risk for obstructive sleep apnea. Jessica [name] has a history of symptoms of daytime Jessica. Patient generally gets 8 hours of sleep per night, and states they generally have generally restful sleep. Snoring is not present. Apneic episodes are not  present. Epworth Sleepiness Score is 12.  Dyspnea on exertion Jessica [name] notes increasing shortness of breath with exercising and seems to be worsening over time with weight gain. She notes getting out of breath sooner with activity than she used to. This has not gotten worse recently. Jessica [name] denies orthopnea.  Hypertension Jessica [name] is a 68 y.o. female with hypertension. Jessica [name]'s blood pressure is well controlled. She is taking Norvasc and Zestoretic. She denies chest pain. She is working on weight loss to help control her blood pressure with the goal of decreasing her risk of heart attack and stroke.  Vitamin D Deficiency Jessica [name] has a diagnosis of vitamin D deficiency. She is currently taking OTC Vit D and denies nausea, vomiting or muscle weakness.  Pre-Diabetes Jessica [name] has a diagnosis of pre-diabetes based on her elevated Hgb A1c and was informed this puts her at greater risk of developing diabetes. She has maternal familial hypercholesterolemia. She is taking metformin currently and denies side effects. She continues to work on diet and exercise to decrease risk of diabetes.   Coronary Artery Disease Jessica [name] has a diagnosis of CAD. She denies chest pains, but notes occasional shortness of breath with activity.  Depression Screen Jessica [name]'s Food and Mood (modified PHQ-9) score was  Depression screen PHQ 2/9 03/12/2019  Decreased Interest 1  Down, Depressed, Hopeless 2  PHQ - 2 Score 3  Altered sleeping 2  Tired, decreased energy 3  Change in appetite 2  Feeling bad or failure about yourself  0  Trouble concentrating 0  Moving  slowly or fidgety/restless 0  Suicidal thoughts 0  PHQ-9 Score 10  Difficult doing work/chores Somewhat difficult  Some recent data might be hidden    ASSESSMENT AND PLAN:  Other Jessica - Plan: EKG 12-Lead, Zinc, Copper, serum, Vitamin D (25 hydroxy)  Shortness of breath on exertion  OSA (obstructive sleep apnea)  Essential hypertension  Type 2  diabetes mellitus with other neurologic complication, with long-term current use of insulin (Jessica [name]) - Plan: Comprehensive Metabolic Panel (CMET), Urine Microalbumin w/creat. ratio  Celiac disease - Plan: Zinc, Copper, serum, B12, Folate  Abnormal stress test  Depression screening  Hypercholesterolemia  Class 3 severe obesity with serious comorbidity and body mass index (BMI) of 40.0 to 44.9 in adult, unspecified obesity type (HCC)  PLAN:  Jessica [name] was informed that her Jessica may be related to obesity, depression or many other causes. Labs will be ordered, and in the meanwhile Jessica [name] has agreed to work on diet, exercise and weight loss to help with Jessica. Proper sleep hygiene was discussed including the need for 7-8 hours of quality sleep each night. A sleep study was not ordered based on symptoms and Epworth score.  Dyspnea on exertion Jessica [name]'s shortness of breath appears to be obesity related and exercise induced. She has agreed to work on weight loss and gradually increase exercise to treat her exercise induced shortness of breath. If Jessica [name] follows our instructions and loses weight without improvement of her shortness of breath, we will plan to refer to pulmonology. We will monitor this condition regularly. Jessica [name] agrees to this plan.  Hypertension We discussed sodium restriction, working on healthy weight loss, and a regular exercise program as the means to achieve improved blood pressure control. Jessica [name] agreed with this plan and agreed to follow up as directed. We will continue to monitor her blood pressure as well as her progress with the above lifestyle modifications. Jessica [name] agrees to continue her medications as prescribed and will watch for signs of hypotension as she continues her lifestyle modifications. Jessica [name] agrees to follow up with our clinic in 2 to 3 weeks.  Vitamin D Deficiency Jessica [name] was informed that low vitamin D levels contributes to Jessica and are associated  with obesity, breast, and colon cancer. Jessica [name] agrees to continue taking OTC Vit D and will follow up for routine testing of vitamin D, at least 2-3 times per year. She was informed of the risk of over-replacement of vitamin D and agrees to not increase her dose unless she discusses this with [place] first. We will check Vit D level today. Jessica [name] agrees to follow up with our clinic in 2 to 3 weeks.  Pre-Diabetes Tyniesha will continue to work on weight loss, exercise, and decreasing simple carbohydrates in her diet to help decrease the risk of diabetes. We dicussed metformin including benefits and risks. She was informed that eating too many simple carbohydrates or too many calories at one sitting increases the likelihood of GI side effects. Leota agrees to continue taking metformin, and we will check A1c and insulin today. Tationa agrees to follow up with our clinic in 2 to 3 weeks as directed to monitor her progress.  Coronary Artery Disease Analei is to follow up with her primary care physician, and she agrees to follow up with our clinic in 2 to 3 weeks.  Depression Screen Shady had a negative depression screening. Depression is commonly associated with obesity and often results in emotional eating behaviors. We will monitor this closely and work on CBT to help  improve the non-hunger eating patterns. Referral to Psychology may be required if no improvement is seen as she continues in our clinic.  Obesity Ashonti is currently in the action stage of change and her goal is to continue with weight loss efforts. I recommend Yuktha begin the structured treatment plan as follows:  She has agreed to follow the Category 3 plan with additional breakfast options + 100 calories for sugar and cream Jakia has been instructed to eventually work up to a goal of 150 minutes of combined cardio and strengthening exercise per week for weight loss and overall health benefits. We discussed the following Behavioral  Modification Strategies today: increasing lean protein intake, decreasing simple carbohydrates, increasing vegetables, no skipping meals, work on meal planning and easy cooking plans and decrease liquid calories   She was informed of the importance of frequent follow up visits to maximize her success with intensive lifestyle modifications for her multiple health conditions. She was informed we would discuss her lab results at her next visit unless there is a critical issue that needs to be addressed sooner. Ranessa agreed to keep her next visit at the agreed upon time to discuss these results.  ALLERGIES: Allergies  Allergen Reactions  . Gluten Meal Other (See Comments)    No wheat or soy  . Nsaids Nausea And Vomiting and Other (See Comments)    Cannot tolerate large doses   . Soy Allergy   . Wheat Bran     MEDICATIONS: Current Outpatient Medications on File Prior to Visit  Medication Sig Dispense Refill  . albuterol (PROVENTIL HFA;VENTOLIN HFA) 108 (90 Base) MCG/ACT inhaler Inhale 2 puffs into the lungs every 6 (six) hours as needed for wheezing or shortness of breath.     Marland Kitchen atorvastatin (LIPITOR) 40 MG tablet Take 1 tablet (40 mg total) by mouth daily. 90 tablet 1  . BD INSULIN SYRINGE U/F 31G X 5/16" 1 ML MISC USE UP TO 3 TIMES A DAY 90 each 2  . dexlansoprazole (DEXILANT) 60 MG capsule Take 1 capsule (60 mg total) by mouth daily. NEEDS OFFICE VISIT FOR FURTHER REFILLS! 90 capsule 0  . Fluticasone-Salmeterol (ADVAIR DISKUS) 100-50 MCG/DOSE AEPB Inhale 1 puff into the lungs 2 (two) times daily as needed (for shortness of breath or wheezing). 180 each 3  . gabapentin (NEURONTIN) 600 MG tablet TAKE 1 TABLET IN THE MORNING AND 2 TABLETS AT BEDTIME 270 tablet 2  . glucose blood (ONE TOUCH ULTRA TEST) test strip 1 each by Other route 2 (two) times daily. And lancets 2/day 200 each 3  . ibuprofen (ADVIL) 200 MG tablet Take 200 mg by mouth daily.    . insulin NPH-regular Human (HUMULIN 70/30)  (70-30) 100 UNIT/ML injection 95 units with breakfast, and 32 units with the evening meal. 50 mL 5  . olmesartan-hydrochlorothiazide (BENICAR HCT) 40-12.5 MG tablet TAKE 1 TABLET BY MOUTH EVERY DAY 90 tablet 3  . polyethylene glycol powder ([employer] PURELAX) 17 GM/SCOOP powder DISSOLVE 1 CAPFUL IN AT LEAST 8 OUNCES WATER/JUICE AND DRINK TWICE DAILY 1020 g 2  . valACYclovir (VALTREX) 500 MG tablet Take 1 tablet (500 mg total) by mouth daily. 90 tablet 3  . sucralfate (CARAFATE) 1 GM/10ML suspension Take 10 mLs (1 g total) by mouth 4 (four) times daily. 420 mL 1  . tiZANidine (ZANAFLEX) 4 MG capsule TAKE 1 CAPSULE BY MOUTH EVERY DAY IN THE EVENING (Patient not taking: Reported on 03/12/2019) 30 capsule 0   No current facility-administered medications on  file prior to visit.     PAST MEDICAL HISTORY: Past Medical History:  Diagnosis Date  . Anxiety   . Arthritis    bilateral knees  . Asthma    childhood  . Autonomic neuropathy    diabetic  . Back pain   . Cataract   . Celiac disease   . Constipation   . Depression   . Diabetes mellitus    type II, Hemoglobin A1C 9.9 10/05/2011  . Diabetic neuropathy ([place])   . Diverticulosis   . Fatty liver   . Food allergy   . Gastroparesis   . GERD (gastroesophageal reflux disease)   . Hyperlipidemia   . Hypertension   . IBS (irritable bowel syndrome)   . IBS (irritable bowel syndrome)   . Joint pain   . Kidney stones   . Personal history of colonic polyps 03/2010   hyperplastic  . Pneumonia 11/03/2015   current  . SOB (shortness of breath)     PAST SURGICAL HISTORY: Past Surgical History:  Procedure Laterality Date  . APPENDECTOMY    . CATARACT EXTRACTION Right 04/29/2018  . CATARACT EXTRACTION Left 03/2018  . COLONOSCOPY    . DILATATION & CURRETTAGE/HYSTEROSCOPY WITH RESECTOCOPE N/A 03/24/2013   Procedure: DILATATION & CURETTAGE, HYSTEROSCOPY WITH RESECTION;  Surgeon: Olga Millers, MD;  Location: [facility];  Service: Gynecology;   Laterality: N/A;  . HYSTEROSCOPY W/D&C N/A 11/08/2015   Procedure: DILATATION AND CURETTAGE /HYSTEROSCOPY;  Surgeon: Olga Millers, MD;  Location: [facility];  Service: Gynecology;  Laterality: N/A;  . left breast cyst removal  2000  . LEFT HEART CATH AND CORONARY ANGIOGRAPHY N/A 10/31/2018   Procedure: LEFT HEART CATH AND CORONARY ANGIOGRAPHY;  Surgeon: Jettie Booze, MD;  Location: [facility];  Service: Cardiovascular;  Laterality: N/A;  . TUBAL LIGATION      SOCIAL HISTORY: Social History   Tobacco Use  . Smoking status: Never Smoker  . Smokeless tobacco: Never Used  Substance Use Topics  . Alcohol use: No  . Drug use: No    FAMILY HISTORY: Family History  Problem Relation Age of Onset  . Hypertension Mother   . Colon cancer Sister        dx in her early 50s  . Diabetes Sister   . Endometrial cancer Sister   . Hypertension Brother   . Other Father        2 collapsed lungs  . COPD Father   . Diabetes Father   . Heart attack Father   . Heart failure Father   . High blood pressure Father     ROS: Review of Systems  Constitutional: Positive for malaise/Jessica. Negative for weight loss.  HENT: Positive for nosebleeds.        + Dentures  Eyes: Positive for blurred vision.       + Vision changes + Floaters  Respiratory: Positive for shortness of breath (with exertion).   Cardiovascular: Negative for orthopnea.       + Leg cramping  Gastrointestinal: Negative for nausea and vomiting.  Musculoskeletal:       Negative muscle weakness + Muscle or joint pain  Neurological: Positive for headaches.    PHYSICAL EXAM: Blood pressure 131/71, pulse 74, temperature 98.5 F (36.9 C), height 5' 5"  (1.651 m), weight 246 lb (111.6 kg), SpO2 100 %. Body mass index is 40.94 kg/m. Physical Exam Vitals signs reviewed.  Constitutional:      Appearance: Normal appearance. She is obese.  HENT:     Head: Normocephalic and atraumatic.     Nose: Nose normal.  Eyes:      General: No scleral icterus.    Extraocular Movements: Extraocular movements intact.  Neck:     Musculoskeletal: Normal range of motion and neck supple.     Comments: No thyromegaly present Cardiovascular:     Rate and Rhythm: Normal rate and regular rhythm.     Pulses: Normal pulses.     Heart sounds: Normal heart sounds.  Pulmonary:     Effort: Pulmonary effort is normal. No respiratory distress.     Breath sounds: Normal breath sounds.  Abdominal:     Palpations: Abdomen is soft.     Tenderness: There is no abdominal tenderness.     Comments: + Obesity  Musculoskeletal: Normal range of motion.     Right lower leg: No edema.     Left lower leg: No edema.  Skin:    General: Skin is warm and dry.  Neurological:     Mental Status: She is alert and oriented to person, place, and time.     Coordination: Coordination normal.  Psychiatric:        Mood and Affect: Mood normal.        Behavior: Behavior normal.     RECENT LABS AND TESTS: BMET    Component Value Date/Time   NA 139 03/12/2019 1116   K 4.6 03/12/2019 1116   CL 101 03/12/2019 1116   CO2 27 03/12/2019 1116   GLUCOSE 142 (H) 03/12/2019 1116   GLUCOSE 188 (H) 10/16/2018 0932   BUN 17 03/12/2019 1116   CREATININE 0.98 03/12/2019 1116   CREATININE 0.83 04/04/2012 0832   CALCIUM 9.4 03/12/2019 1116   GFRNONAA 59 (L) 03/12/2019 1116   GFRAA 69 03/12/2019 1116   Lab Results  Component Value Date   HGBA1C 7.2 (A) 02/20/2019   No results found for: INSULIN CBC    Component Value Date/Time   WBC 4.8 10/28/2018 1218   WBC 3.5 (L) 10/16/2018 0932   RBC 4.17 10/28/2018 1218   RBC 4.10 10/16/2018 0932   HGB 13.4 10/28/2018 1218   HCT 40.4 10/28/2018 1218   PLT 260 10/28/2018 1218   MCV 97 10/28/2018 1218   MCH 32.1 10/28/2018 1218   MCH 32.8 01/07/2016 1400   MCHC 33.2 10/28/2018 1218   MCHC 33.5 10/16/2018 0932   RDW 12.0 10/28/2018 1218   LYMPHSABS 1.4 10/16/2018 0932   MONOABS 0.2 10/16/2018 0932    EOSABS 0.1 10/16/2018 0932   BASOSABS 0.0 10/16/2018 0932   Iron/TIBC/Ferritin/ %Sat    Component Value Date/Time   IRON 74 10/25/2017 1414   TIBC 322 04/04/2012 0832   IRONPCTSAT 19.9 (L) 10/25/2017 1414   IRONPCTSAT 25 04/04/2012 0832   Lipid Panel     Component Value Date/Time   CHOL 134 10/16/2018 0932   TRIG 64.0 10/16/2018 0932   HDL 47.40 10/16/2018 0932   CHOLHDL 3 10/16/2018 0932   VLDL 12.8 10/16/2018 0932   LDLCALC 74 10/16/2018 0932   LDLDIRECT 143.2 11/23/2006 1130   Hepatic Function Panel     Component Value Date/Time   PROT 6.7 03/12/2019 1116   ALBUMIN 4.2 03/12/2019 1116   AST 17 03/12/2019 1116   ALT 13 03/12/2019 1116   ALKPHOS 99 03/12/2019 1116   BILITOT 0.5 03/12/2019 1116   BILIDIR 0.1 10/25/2017 1414   IBILI 0.4 04/04/2012 0832      Component Value Date/Time  TSH 2.15 10/16/2018 0932   TSH 1.27 10/25/2017 1414   TSH 1.46 10/02/2016 1521    ECG  shows NSR with a rate of 66 BPM INDIRECT CALORIMETER done today shows a VO2 of 299 and a REE of 2078.  Her calculated basal metabolic rate is 3419 thus her basal metabolic rate is better than expected.       OBESITY BEHAVIORAL INTERVENTION VISIT  Today's visit was # 1   Starting weight: 246 lbs Starting date: 03/12/2019 Today's weight : 246 lbs  Today's date: 03/12/2019 Total lbs lost to date: 0 At least 15 minutes were spent on discussing the following behavioral intervention visit.   ASK: We discussed the diagnosis of obesity with Frederick today and Ziaire agreed to give [place] permission to discuss obesity behavioral modification therapy today.  ASSESS: Jolena has the diagnosis of obesity and her BMI today is 52.08 Chenel is in the action stage of change   ADVISE: Vasiliki was educated on the multiple health risks of obesity as well as the benefit of weight loss to improve her health. She was advised of the need for long term treatment and the importance of lifestyle  modifications to improve her current health and to decrease her risk of future health problems.  AGREE: Multiple dietary modification options and treatment options were discussed and  Malkie agreed to follow the recommendations documented in the above note.  ARRANGE: Gerica was educated on the importance of frequent visits to treat obesity as outlined per CMS and USPSTF guidelines and agreed to schedule her next follow up appointment today.  Wilhemena Durie, am acting as Location manager for [employer], DO

## 2019-03-13 NOTE — Assessment & Plan Note (Signed)
Degenerative disc disease.  Discussed with patient in great length about icing regimen, home exercise, which activities to do which was to avoid.  Patient has been doing relatively well we will not change medications at the moment.  Patient will follow up in 6 weeks.  If continues to have difficulty then we will need to consider the possibility of MRI.

## 2019-03-13 NOTE — Progress Notes (Signed)
Office: [phone]  /  Fax: [phone]   Dear Dr. Jonni [name],   Thank you for referring Jessica [name] to our clinic. The following note includes my evaluation and treatment recommendations.  HPI:   Chief Complaint: OBESITY    Jessica [name] has been referred by Jessica Gros L. Jessica Sanger, MD for consultation regarding her obesity and obesity related comorbidities.    Jessica [name] (MR# [id number]) is a 68 y.o. female who presents on 03/12/2019 for obesity evaluation and treatment. Current BMI is Body mass index is 40.94 kg/m. Jessica [name] has been struggling with her weight for many years and has been unsuccessful in either losing weight, maintaining weight loss, or reaching her healthy weight goal.     Jessica [name] eats outside the home about 5 times a week. She does like to cook. She craves chocolate and pecans. She skips breakfast and dinner at times.     Jessica [name] states she is currently in the action stage of change and ready to dedicate time achieving and maintaining a healthier weight. Jessica [name] is interested in becoming our patient and working on intensive lifestyle modifications including (but not limited to) diet, exercise and weight loss.    Jessica [name] states her family eats meals together she thinks her family will eat healthier with  her her desired weight loss is 46 lbs she has been heavy most of  her life she started gaining weight in 2000 her heaviest weight ever was 252 lbs she has significant food cravings issues  she skips meals frequently she is frequently drinking liquids with calories she struggles with emotional eating    Fatigue Jessica [name] feels her energy is lower than it should be. This has worsened with weight gain and has not worsened recently. Jessica [name] admits to daytime somnolence and  admits to waking up still tired. Patient has a history of obstructive sleep apnea. Patent has a history of symptoms of daytime fatigue. Patient generally gets 5 hours of sleep per night, and states they  generally have nightime awakenings. Snoring is present. Apneic episodes are present. Epworth Sleepiness Score is 6.  Dyspnea on exertion Jessica [name] notes increasing shortness of breath with exercising and seems to be worsening over time with weight gain. She notes getting out of breath sooner with activity than she used to. This has not gotten worse recently. Jessica [name] denies orthopnea.  Hypertension Jessica [name] is a 68 y.o. female with hypertension. Jessica [name], blood is well controlled. She is on Benicar HCT, and she has not taken her medications yet. She denies chest pain. She is working on weight loss to help control her blood pressure with the goal of decreasing her risk of heart attack and stroke.   Obstructive Sleep Apnea Jessica [name] has a diagnosis of OSA. She does not wear her CPAP, and notes ongoing insomnia.   History of Abnormal Stress Test Jessica [name] has a history of abnormal stress test (false positive). She had a celiac cath, normal. LVEF 55-65 %.  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 Jessica [name] is taking insulin 70/30 (35 in HS and 95 in the AM. She has a history of diabetic gastric poles. Last A1c was 7.2 on 02/20/2019. She has been working on intensive lifestyle modifications including diet, exercise, and weight loss to help control her blood glucose levels.  Celiac Disease Jessica [name] has a diagnosis of celiac disease. She was diagnosed at the age of 33. She takes miralax dexilant.  Hypercholesterolemia [name] has hypercholesterolemia and has been trying to improve  her cholesterol levels with intensive lifestyle modification including a low saturated fat diet, exercise and weight loss. She is taking Lipitor and denies any chest pain, claudication or myalgias.  Depression Screen Jessica [name]'s Food and Mood (modified PHQ-9) score was  Depression screen PHQ 2/9 03/12/2019  Decreased Interest 1  Down, Depressed, Hopeless 2  PHQ - 2 Score 3  Altered sleeping 2  Tired,  decreased energy 3  Change in appetite 2  Feeling bad or failure about yourself  0  Trouble concentrating 0  Moving slowly or fidgety/restless 0  Suicidal thoughts 0  PHQ-9 Score 10  Difficult doing work/chores Somewhat difficult  Some recent data might be hidden    ASSESSMENT AND PLAN:  Other fatigue - Plan: EKG 12-Lead, Zinc, Copper, serum, Vitamin D (25 hydroxy)  Shortness of breath on exertion  OSA (obstructive sleep apnea)  Essential hypertension  Type 2 diabetes mellitus with other neurologic complication, with long-term current use of insulin (HCC) - Plan: Comprehensive Metabolic Panel (CMET), Urine Microalbumin w/creat. ratio  Celiac disease - Plan: Zinc, Copper, serum, B12, Folate  Abnormal stress test  Depression screening  Hypercholesterolemia  Class 3 severe obesity with serious comorbidity and body mass index (BMI) of 40.0 to 44.9 in adult, unspecified obesity type (HCC)  PLAN:  Fatigue Jessica [name] was informed that her fatigue may be related to obesity, depression or many other causes. Labs will be ordered, and in the meanwhile Jessica [name] has agreed to work on diet, exercise and weight loss to help with fatigue. Proper sleep hygiene was discussed including the need for 7-8 hours of quality sleep each night. A sleep study was not ordered based on symptoms and Epworth score.  Dyspnea on exertion [name]'s shortness of breath appears to be obesity related and exercise induced. She has agreed to work on weight loss and gradually increase exercise to treat her exercise induced shortness of breath. If Jessica [name] follows our instructions and loses weight without improvement of her shortness of breath, we will plan to refer to pulmonology. We will monitor this condition regularly. Jessica agrees to this plan.  Hypertension We discussed sodium restriction, working on healthy weight loss, and a regular exercise program as the means to achieve improved blood pressure control. Jessica [name]  agreed with this plan and agreed to follow up as directed. We will continue to monitor her blood pressure as well as her progress with the above lifestyle modifications. Mariaeduarda will continue her medications as prescribed and will watch for signs of hypotension as she continues her lifestyle modifications. Kaeden agrees to follow up with our clinic in 2 to 3 weeks.  Obstructive Sleep Apnea We discussed the importance of wearing her CPAP today.  History of Abnormal Stress Test Jessica [name] agrees to continue her medications, and she is to follow up with her primary care physician. Jessica [name] agrees to follow up with our clinic in 2 to 3 weeks.  Diabetes II with Neuropathy Jessica [name] has been given extensive diabetes education by myself today including ideal fasting and post-prandial blood glucose readings, individual ideal Hgb A1c goals and hypoglycemia prevention. We discussed the importance of good blood sugar control to decrease the likelihood of diabetic complications such as nephropathy, neuropathy, limb loss, blindness, coronary artery disease, and death. We discussed the importance of intensive lifestyle modification including diet, exercise and weight loss as the first line treatment for diabetes. Jessica [name] agrees to continue her diabetes medications, and will check her fasting BGs and 2 hour post prandials. If her BGs are dropping then  decrease insulin to 50 unites in the morning and 15 units in the evening. Jessica [name] agrees to follow up with our clinic in 2 to 3 weeks.  Celiac Disease We will check Vit B12, Vit D, and Folate today. She is to continue to read labels.  Hypercholesterolemia Jessica [name] was informed of the American Heart Association Guidelines emphasizing intensive lifestyle modifications as the first line treatment for hyperlipidemia. We discussed many lifestyle modifications today in depth. [name] will continue her medications, and will continue to work on decreasing saturated fats such as fatty red  meat, butter and many fried foods. She will also increase vegetables and lean protein in her diet and continue to work on exercise and weight loss efforts. Cyrstal agrees to follow up with our clinic in 2 to 3 weeks.  Depression Screen Shahed had a moderately positive depression screening. Depression is commonly associated with obesity and often results in emotional eating behaviors. We will monitor this closely and work on CBT to help improve the non-hunger eating patterns. Referral to Psychology may be required if no improvement is seen as she continues in our clinic.  Obesity Tyeshia is currently in the action stage of change and her goal is to continue with weight loss efforts. I recommend Rocquel begin the structured treatment plan as follows:  She has agreed to follow the Category 3 plan, modified for celiac disease She is to switch to [employer], 90 calories. Madasyn has been instructed to eventually work up to a goal of 150 minutes of combined cardio and strengthening exercise per week for weight loss and overall health benefits. We discussed the following Behavioral Modification Strategies today: increasing lean protein intake, decreasing simple carbohydrates , increasing vegetables, decrease eating out, work on meal planning and easy cooking plans, ways to avoid boredom eating and decrease liquid calories, increase H20 intake, no skipping meals, and planning for success   She was informed of the importance of frequent follow up visits to maximize her success with intensive lifestyle modifications for her multiple health conditions. She was informed we would discuss her lab results at her next visit unless there is a critical issue that needs to be addressed sooner. Avianah agreed to keep her next visit at the agreed upon time to discuss these results.  ALLERGIES: Allergies  Allergen Reactions  . Gluten Meal Other (See Comments)    No wheat or soy  . Nsaids Nausea And Vomiting and Other  (See Comments)    Cannot tolerate large doses   . Soy Allergy   . Wheat Bran     MEDICATIONS: Current Outpatient Medications on File Prior to Visit  Medication Sig Dispense Refill  . albuterol (PROVENTIL HFA;VENTOLIN HFA) 108 (90 Base) MCG/ACT inhaler Inhale 2 puffs into the lungs every 6 (six) hours as needed for wheezing or shortness of breath.     Marland Kitchen atorvastatin (LIPITOR) 40 MG tablet Take 1 tablet (40 mg total) by mouth daily. 90 tablet 1  . BD INSULIN SYRINGE U/F 31G X 5/16" 1 ML MISC USE UP TO 3 TIMES A DAY 90 each 2  . dexlansoprazole (DEXILANT) 60 MG capsule Take 1 capsule (60 mg total) by mouth daily. NEEDS OFFICE VISIT FOR FURTHER REFILLS! 90 capsule 0  . Fluticasone-Salmeterol (ADVAIR DISKUS) 100-50 MCG/DOSE AEPB Inhale 1 puff into the lungs 2 (two) times daily as needed (for shortness of breath or wheezing). 180 each 3  . gabapentin (NEURONTIN) 600 MG tablet TAKE 1 TABLET IN THE MORNING AND 2 TABLETS AT  BEDTIME 270 tablet 2  . glucose blood (ONE TOUCH ULTRA TEST) test strip 1 each by Other route 2 (two) times daily. And lancets 2/day 200 each 3  . ibuprofen (ADVIL) 200 MG tablet Take 200 mg by mouth daily.    . insulin NPH-regular Human (HUMULIN 70/30) (70-30) 100 UNIT/ML injection 95 units with breakfast, and 32 units with the evening meal. 50 mL 5  . olmesartan-hydrochlorothiazide (BENICAR HCT) 40-12.5 MG tablet TAKE 1 TABLET BY MOUTH EVERY DAY 90 tablet 3  . polyethylene glycol powder ([employer] PURELAX) 17 GM/SCOOP powder DISSOLVE 1 CAPFUL IN AT LEAST 8 OUNCES WATER/JUICE AND DRINK TWICE DAILY 1020 g 2  . valACYclovir (VALTREX) 500 MG tablet Take 1 tablet (500 mg total) by mouth daily. 90 tablet 3  . sucralfate (CARAFATE) 1 GM/10ML suspension Take 10 mLs (1 g total) by mouth 4 (four) times daily. 420 mL 1  . tiZANidine (ZANAFLEX) 4 MG capsule TAKE 1 CAPSULE BY MOUTH EVERY DAY IN THE EVENING 30 capsule 0   No current facility-administered medications on file prior to visit.      PAST MEDICAL HISTORY: Past Medical History:  Diagnosis Date  . Anxiety   . Arthritis    bilateral knees  . Asthma    childhood  . Autonomic neuropathy    diabetic  . Back pain   . Cataract   . Celiac disease   . Constipation   . Depression   . Diabetes mellitus    type II, Hemoglobin A1C 9.9 10/05/2011  . Diabetic neuropathy ([place])   . Diverticulosis   . Fatty liver   . Food allergy   . Gastroparesis   . GERD (gastroesophageal reflux disease)   . Hyperlipidemia   . Hypertension   . IBS (irritable bowel syndrome)   . IBS (irritable bowel syndrome)   . Joint pain   . Kidney stones   . Personal history of colonic polyps 03/2010   hyperplastic  . Pneumonia 11/03/2015   current  . SOB (shortness of breath)     PAST SURGICAL HISTORY: Past Surgical History:  Procedure Laterality Date  . APPENDECTOMY    . CATARACT EXTRACTION Right 04/29/2018  . CATARACT EXTRACTION Left 03/2018  . COLONOSCOPY    . DILATATION & CURRETTAGE/HYSTEROSCOPY WITH RESECTOCOPE N/A 03/24/2013   Procedure: DILATATION & CURETTAGE, HYSTEROSCOPY WITH RESECTION;  Surgeon: Olga Millers, MD;  Location: [facility];  Service: Gynecology;  Laterality: N/A;  . HYSTEROSCOPY W/D&C N/A 11/08/2015   Procedure: DILATATION AND CURETTAGE /HYSTEROSCOPY;  Surgeon: Olga Millers, MD;  Location: [facility];  Service: Gynecology;  Laterality: N/A;  . left breast cyst removal  2000  . LEFT HEART CATH AND CORONARY ANGIOGRAPHY N/A 10/31/2018   Procedure: LEFT HEART CATH AND CORONARY ANGIOGRAPHY;  Surgeon: Jettie Booze, MD;  Location: [facility];  Service: Cardiovascular;  Laterality: N/A;  . TUBAL LIGATION      SOCIAL HISTORY: Social History   Tobacco Use  . Smoking status: Never Smoker  . Smokeless tobacco: Never Used  Substance Use Topics  . Alcohol use: No  . Drug use: No    FAMILY HISTORY: Family History  Problem Relation Age of Onset  . Hypertension Mother   . Colon cancer Sister        dx in her  early 74s  . Diabetes Sister   . Endometrial cancer Sister   . Hypertension Brother   . Other Father        2 collapsed  lungs  . COPD Father   . Diabetes Father   . Heart attack Father   . Heart failure Father   . High blood pressure Father     ROS: Review of Systems  Constitutional: Positive for malaise/fatigue. Negative for weight loss.       + Trouble sleeping  HENT: Positive for ear pain, sinus pain and tinnitus.        + Nasal stuffiness  Eyes:       + Wear glasses or contacts + Floaters  Respiratory: Positive for shortness of breath (with exertion) and wheezing.   Cardiovascular: Negative for chest pain and orthopnea.       + Difficulty breathing while lying down + Sudden awakening from sleep with shortness of breath + Leg cramping  Gastrointestinal: Positive for heartburn and nausea.  Musculoskeletal: Positive for back pain.       + Muscle or joint pain  Skin: Positive for itching.       + Dryness  Neurological: Positive for weakness.  Endo/Heme/Allergies: Bruises/bleeds easily.  Psychiatric/Behavioral: The patient is nervous/anxious and has insomnia.        + Stress    PHYSICAL EXAM: Blood pressure 131/71, pulse 74, temperature 98.5 F (36.9 C), height 5' 5"  (1.651 m), weight 246 lb (111.6 kg), SpO2 100 %. Body mass index is 40.94 kg/m. Physical Exam Vitals signs reviewed.  Constitutional:      Appearance: Normal appearance. She is obese.  HENT:     Head: Normocephalic and atraumatic.     Nose: Nose normal.  Eyes:     General: No scleral icterus.    Extraocular Movements: Extraocular movements intact.  Neck:     Musculoskeletal: Normal range of motion and neck supple.     Comments: No thyromegaly present Cardiovascular:     Rate and Rhythm: Normal rate and regular rhythm.     Pulses: Normal pulses.     Heart sounds: Normal heart sounds.  Pulmonary:     Effort: Pulmonary effort is normal. No respiratory distress.     Breath sounds: Normal breath  sounds.  Abdominal:     Palpations: Abdomen is soft.     Tenderness: There is no abdominal tenderness.     Comments: + Obesity  Musculoskeletal: Normal range of motion.     Right lower leg: No edema.     Left lower leg: No edema.  Skin:    General: Skin is warm and dry.  Neurological:     Mental Status: She is alert and oriented to person, place, and time.     Coordination: Coordination normal.  Psychiatric:        Mood and Affect: Mood normal.        Behavior: Behavior normal.     RECENT LABS AND TESTS: BMET    Component Value Date/Time   NA 139 03/12/2019 1116   K 4.6 03/12/2019 1116   CL 101 03/12/2019 1116   CO2 27 03/12/2019 1116   GLUCOSE 142 (H) 03/12/2019 1116   GLUCOSE 188 (H) 10/16/2018 0932   BUN 17 03/12/2019 1116   CREATININE 0.98 03/12/2019 1116   CREATININE 0.83 04/04/2012 0832   CALCIUM 9.4 03/12/2019 1116   GFRNONAA 59 (L) 03/12/2019 1116   GFRAA 69 03/12/2019 1116   Lab Results  Component Value Date   HGBA1C 7.2 (A) 02/20/2019   No results found for: INSULIN CBC    Component Value Date/Time   WBC 4.8 10/28/2018 1218   WBC 3.5 (L)  10/16/2018 0932   RBC 4.17 10/28/2018 1218   RBC 4.10 10/16/2018 0932   HGB 13.4 10/28/2018 1218   HCT 40.4 10/28/2018 1218   PLT 260 10/28/2018 1218   MCV 97 10/28/2018 1218   MCH 32.1 10/28/2018 1218   MCH 32.8 01/07/2016 1400   MCHC 33.2 10/28/2018 1218   MCHC 33.5 10/16/2018 0932   RDW 12.0 10/28/2018 1218   LYMPHSABS 1.4 10/16/2018 0932   MONOABS 0.2 10/16/2018 0932   EOSABS 0.1 10/16/2018 0932   BASOSABS 0.0 10/16/2018 0932   Iron/TIBC/Ferritin/ %Sat    Component Value Date/Time   IRON 74 10/25/2017 1414   TIBC 322 04/04/2012 0832   IRONPCTSAT 19.9 (L) 10/25/2017 1414   IRONPCTSAT 25 04/04/2012 0832   Lipid Panel     Component Value Date/Time   CHOL 134 10/16/2018 0932   TRIG 64.0 10/16/2018 0932   HDL 47.40 10/16/2018 0932   CHOLHDL 3 10/16/2018 0932   VLDL 12.8 10/16/2018 0932   LDLCALC  74 10/16/2018 0932   LDLDIRECT 143.2 11/23/2006 1130   Hepatic Function Panel     Component Value Date/Time   PROT 6.7 03/12/2019 1116   ALBUMIN 4.2 03/12/2019 1116   AST 17 03/12/2019 1116   ALT 13 03/12/2019 1116   ALKPHOS 99 03/12/2019 1116   BILITOT 0.5 03/12/2019 1116   BILIDIR 0.1 10/25/2017 1414   IBILI 0.4 04/04/2012 0832      Component Value Date/Time   TSH 2.15 10/16/2018 0932   TSH 1.27 10/25/2017 1414   TSH 1.46 10/02/2016 1521    ECG  shows NSR with a rate of 67 BPM INDIRECT CALORIMETER done today shows a VO2 of 265 and a REE of 1847.  Her calculated basal metabolic rate is 9935 thus her basal metabolic rate is better than expected.       OBESITY BEHAVIORAL INTERVENTION VISIT  Today's visit was # 1   Starting weight: 246 lbs Starting date: 03/12/2019 Today's weight : 246 lbs  Today's date: 03/12/2019 Total lbs lost to date: 0 At least 15 minutes were spent on discussing the following behavioral intervention visit.   ASK: We discussed the diagnosis of obesity with [facility] today and Sulamita agreed to give [place] permission to discuss obesity behavioral modification therapy today.  ASSESS: Asanti has the diagnosis of obesity and her BMI today is 40.94 Cherissa is in the action stage of change   ADVISE: Kenlee was educated on the multiple health risks of obesity as well as the benefit of weight loss to improve her health. She was advised of the need for long term treatment and the importance of lifestyle modifications to improve her current health and to decrease her risk of future health problems.  AGREE: Multiple dietary modification options and treatment options were discussed and  Mintie agreed to follow the recommendations documented in the above note.  ARRANGE: Aubrionna was educated on the importance of frequent visits to treat obesity as outlined per CMS and USPSTF guidelines and agreed to schedule her next follow up appointment today.  Wilhemena Durie, am acting as transcriptionist for [employer], DO   I have reviewed the above documentation for accuracy and completeness, and I agree with the above. -[name], DO

## 2019-03-13 NOTE — Progress Notes (Signed)
[name] [place], [place] [address] Phone: [phone] Subjective:   Jessica [name], am serving as a scribe for Dr. Hulan Saas.   CC: Neck pain follow-up  [id number]   02/12/2019  Jessica [name] is a 68 y.o. female coming in with complaint of neck pain. Patient states that her pain lasted longer than it did before but pain has finally subsided. Was using mm relaxer but [name] longer has to take them because she is unsure if she should continue medication. Is now having a muscle cramping in the bicep and superior shoulder ntermittently. Occurs more in the morning than afternoon. Continues to have numbness and tingling into the fingers of right hand. Also continues to use gabapentin throughout the day.     Past Medical History:  Diagnosis Date  . Anxiety   . Arthritis    bilateral knees  . Asthma    childhood  . Autonomic neuropathy    diabetic  . Back pain   . Cataract   . Celiac disease   . Constipation   . Depression   . Diabetes mellitus    type II, Hemoglobin A1C 9.9 10/05/2011  . Diabetic neuropathy ([place])   . Diverticulosis   . Fatty liver   . Food allergy   . Gastroparesis   . GERD (gastroesophageal reflux disease)   . Hyperlipidemia   . Hypertension   . IBS (irritable bowel syndrome)   . IBS (irritable bowel syndrome)   . Joint pain   . Kidney stones   . Personal history of colonic polyps 03/2010   hyperplastic  . Pneumonia 11/03/2015   current  . SOB (shortness of breath)    Past Surgical History:  Procedure Laterality Date  . APPENDECTOMY    . CATARACT EXTRACTION Right 04/29/2018  . CATARACT EXTRACTION Left 03/2018  . COLONOSCOPY    . DILATATION & CURRETTAGE/HYSTEROSCOPY WITH RESECTOCOPE N/A 03/24/2013   Procedure: DILATATION & CURETTAGE, HYSTEROSCOPY WITH RESECTION;  Surgeon: Olga Millers, MD;  Location: [facility];  Service: Gynecology;  Laterality: N/A;  . HYSTEROSCOPY W/D&C N/A 11/08/2015   Procedure:  DILATATION AND CURETTAGE /HYSTEROSCOPY;  Surgeon: Olga Millers, MD;  Location: [facility];  Service: Gynecology;  Laterality: N/A;  . left breast cyst removal  2000  . LEFT HEART CATH AND CORONARY ANGIOGRAPHY N/A 10/31/2018   Procedure: LEFT HEART CATH AND CORONARY ANGIOGRAPHY;  Surgeon: Jettie Booze, MD;  Location: [facility];  Service: Cardiovascular;  Laterality: N/A;  . TUBAL LIGATION     Social History   Socioeconomic History  . Marital status: Married    Spouse name: Devar Salmi  . Number of children: 2  . Years of education: Not on file  . Highest education level: Not on file  Occupational History  . 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 Financial resource strain: Not on file  . Food insecurity    Worry: Not on file    Inability: Not on file  . Transportation needs    Medical: Not on file    Non-medical: Not on file  Tobacco Use  . Smoking status: Never Smoker  . Smokeless tobacco: Never Used  Substance and Sexual Activity  . Alcohol use: [name]  . Drug use: [name]  . Sexual activity: Yes    Birth control/protection: Post-menopausal, Surgical  Lifestyle  . Physical activity  Days per week: Not on file    Minutes per session: Not on file  . Stress: Not on file  Relationships  . Social Herbalist on phone: Not on file    Gets together: Not on file    Attends religious service: Not on file    Active member of club or organization: Not on file    Attends meetings of clubs or organizations: Not on file    Relationship status: Not on file  Other Topics Concern  . Not on file  Social History Narrative  . Not on file   Allergies  Allergen Reactions  . Gluten Meal Other (See Comments)    [name] wheat or soy  . Nsaids Nausea And Vomiting and Other (See Comments)    Cannot tolerate large doses   . Soy Allergy   . Wheat Bran    Family History  Problem Relation Age of Onset  .  Hypertension Mother   . Colon cancer Sister        dx in her early 60s  . Diabetes Sister   . Endometrial cancer Sister   . Hypertension Brother   . Other Father        2 collapsed lungs  . COPD Father   . Diabetes Father   . Heart attack Father   . Heart failure Father   . High blood pressure Father     Current Outpatient Medications (Endocrine & Metabolic):  .  insulin NPH-regular Human (HUMULIN 70/30) (70-30) 100 UNIT/ML injection, 95 units with breakfast, and 32 units with the evening meal.  Current Outpatient Medications (Cardiovascular):  .  atorvastatin (LIPITOR) 40 MG tablet, Take 1 tablet (40 mg total) by mouth daily. Marland Kitchen  olmesartan-hydrochlorothiazide (BENICAR HCT) 40-12.5 MG tablet, TAKE 1 TABLET BY MOUTH EVERY DAY  Current Outpatient Medications (Respiratory):  .  albuterol (PROVENTIL HFA;VENTOLIN HFA) 108 (90 Base) MCG/ACT inhaler, Inhale 2 puffs into the lungs every 6 (six) hours as needed for wheezing or shortness of breath.  .  Fluticasone-Salmeterol (ADVAIR DISKUS) 100-50 MCG/DOSE AEPB, Inhale 1 puff into the lungs 2 (two) times daily as needed (for shortness of breath or wheezing).  Current Outpatient Medications (Analgesics):  .  ibuprofen (ADVIL) 200 MG tablet, Take 200 mg by mouth daily.   Current Outpatient Medications (Other):  .  BD INSULIN SYRINGE U/F 31G X 5/16" 1 ML MISC, USE UP TO 3 TIMES A DAY .  dexlansoprazole (DEXILANT) 60 MG capsule, Take 1 capsule (60 mg total) by mouth daily. NEEDS OFFICE VISIT FOR FURTHER REFILLS! .  gabapentin (NEURONTIN) 600 MG tablet, TAKE 1 TABLET IN THE MORNING AND 2 TABLETS AT BEDTIME .  glucose blood (ONE TOUCH ULTRA TEST) test strip, 1 each by Other route 2 (two) times daily. And lancets 2/day .  polyethylene glycol powder ([employer] PURELAX) 17 GM/SCOOP powder, DISSOLVE 1 CAPFUL IN AT LEAST 8 OUNCES WATER/JUICE AND DRINK TWICE DAILY .  sucralfate (CARAFATE) 1 GM/10ML suspension, Take 10 mLs (1 g total) by mouth 4 (four) times  daily. Marland Kitchen  tiZANidine (ZANAFLEX) 4 MG capsule, TAKE 1 CAPSULE BY MOUTH EVERY DAY IN THE EVENING .  valACYclovir (VALTREX) 500 MG tablet, Take 1 tablet (500 mg total) by mouth daily.    Past medical history, social, surgical and family history all reviewed in electronic medical record.  [name] pertanent information unless stated regarding to the chief complaint.   Review of Systems:  [name] headache, visual changes, nausea, vomiting, diarrhea, constipation, dizziness,  abdominal pain, skin rash, fevers, chills, night sweats, weight loss, swollen lymph nodes, body aches, joint swelling, muscle aches, chest pain, shortness of breath, mood changes.   Objective  Blood pressure 112/68, pulse 81, height 5' 5"  (1.651 m), weight 251 lb (113.9 kg), SpO2 97 %.    General: [name] apparent distress alert and oriented x3 mood and affect normal, dressed appropriately.  HEENT: Pupils equal, extraocular movements intact  Respiratory: Patient's speak in full sentences and does not appear short of breath  Cardiovascular: [name] lower extremity edema, non tender, [name] erythema  Skin: Warm dry intact with [name] signs of infection or rash on extremities or on axial skeleton.  Abdomen: Soft nontender  Neuro: Cranial nerves II through XII are intact, neurovascularly intact in all extremities with 2+ DTRs and 2+ pulses.  Lymph: [name] lymphadenopathy of posterior or anterior cervical chain or axillae bilaterally.  Gait normal with good balance and coordination.  MSK:  Non tender with full range of motion and good stability and symmetric strength and tone of shoulders, elbows, wrist, hip, knee and ankles bilaterally.  Neck exam does have some loss of lordosis.  Patient does have a mild positive Spurling's on the right side still.  Decreased range of motion with side bending extension.  Mild weakness noted in the C8 distribution on the right side.   Impression and Recommendations:     The above documentation has been reviewed and is  accurate and complete Lyndal Pulley, DO       Note: This dictation was prepared with Dragon dictation along with smaller phrase technology. Any transcriptional errors that result from this process are unintentional.

## 2019-03-14 LAB — COMPREHENSIVE METABOLIC PANEL
ALT: 13 IU/L (ref 0–32)
AST: 17 IU/L (ref 0–40)
Albumin/Globulin Ratio: 1.7 (ref 1.2–2.2)
Albumin: 4.2 g/dL (ref 3.8–4.8)
Alkaline Phosphatase: 99 IU/L (ref 39–117)
BUN/Creatinine Ratio: 17 (ref 12–28)
BUN: 17 mg/dL (ref 8–27)
Bilirubin Total: 0.5 mg/dL (ref 0.0–1.2)
CO2: 27 mmol/L (ref 20–29)
Calcium: 9.4 mg/dL (ref 8.7–10.3)
Chloride: 101 mmol/L (ref 96–106)
Creatinine, Ser: 0.98 mg/dL (ref 0.57–1.00)
GFR calc Af Amer: 69 mL/min/{1.73_m2} (ref >59)
GFR calc non Af Amer: 59 mL/min/{1.73_m2} — ABNORMAL LOW (ref >59)
Globulin, Total: 2.5 g/dL (ref 1.5–4.5)
Glucose: 142 mg/dL — ABNORMAL HIGH (ref 65–99)
Potassium: 4.6 mmol/L (ref 3.5–5.2)
Sodium: 139 mmol/L (ref 134–144)
Total Protein: 6.7 g/dL (ref 6.0–8.5)

## 2019-03-14 LAB — VITAMIN D 25 HYDROXY (VIT D DEFICIENCY, FRACTURES): Vit D, 25-Hydroxy: 34 ng/mL (ref 30.0–100.0)

## 2019-03-14 LAB — FOLATE: Folate: 5.3 ng/mL (ref >3.0)

## 2019-03-14 LAB — MICROALBUMIN / CREATININE URINE RATIO
Creatinine, Urine: 90 mg/dL
Microalb/Creat Ratio: <3 mg/g creat (ref 0–29)
Microalbumin, Urine: <3 ug/mL

## 2019-03-14 LAB — COPPER, SERUM: Copper: 115 ug/dL (ref 72–166)

## 2019-03-14 LAB — VITAMIN B12: Vitamin B-12: 507 pg/mL (ref 232–1245)

## 2019-03-14 LAB — ZINC: Zinc: 76 ug/dL (ref 56–134)

## 2019-03-17 ENCOUNTER — Encounter (INDEPENDENT_AMBULATORY_CARE_PROVIDER_SITE_OTHER): Payer: Self-pay | Admitting: Bariatrics

## 2019-03-24 ENCOUNTER — Encounter: Payer: Self-pay | Admitting: Family Medicine

## 2019-03-24 ENCOUNTER — Other Ambulatory Visit: Payer: Self-pay

## 2019-03-24 DIAGNOSIS — Z20822 Contact with and (suspected) exposure to covid-19: Secondary | ICD-10-CM

## 2019-03-24 DIAGNOSIS — Z20828 Contact with and (suspected) exposure to other viral communicable diseases: Secondary | ICD-10-CM | POA: Diagnosis not present

## 2019-03-25 ENCOUNTER — Ambulatory Visit (INDEPENDENT_AMBULATORY_CARE_PROVIDER_SITE_OTHER): Payer: 59 | Admitting: Bariatrics

## 2019-03-25 LAB — NOVEL CORONAVIRUS, NAA: SARS-CoV-2, NAA: NOT DETECTED

## 2019-03-26 ENCOUNTER — Other Ambulatory Visit: Payer: Self-pay | Admitting: Endocrinology

## 2019-03-26 ENCOUNTER — Telehealth: Payer: Self-pay | Admitting: Endocrinology

## 2019-03-26 NOTE — Telephone Encounter (Signed)
Duplicate Note.

## 2019-04-01 ENCOUNTER — Other Ambulatory Visit: Payer: Self-pay | Admitting: Family Medicine

## 2019-04-02 ENCOUNTER — Other Ambulatory Visit: Payer: Self-pay | Admitting: Family Medicine

## 2019-04-06 ENCOUNTER — Other Ambulatory Visit: Payer: Self-pay | Admitting: Internal Medicine

## 2019-04-08 ENCOUNTER — Ambulatory Visit (INDEPENDENT_AMBULATORY_CARE_PROVIDER_SITE_OTHER): Payer: Medicare Other | Admitting: Bariatrics

## 2019-04-08 ENCOUNTER — Other Ambulatory Visit: Payer: Self-pay

## 2019-04-08 ENCOUNTER — Encounter (INDEPENDENT_AMBULATORY_CARE_PROVIDER_SITE_OTHER): Payer: Self-pay | Admitting: Bariatrics

## 2019-04-08 VITALS — BP 123/54 | HR 73 | Temp 98.0°F | Ht 65.0 in | Wt 238.0 lb

## 2019-04-08 DIAGNOSIS — Z6839 Body mass index (BMI) 39.0-39.9, adult: Secondary | ICD-10-CM

## 2019-04-08 DIAGNOSIS — K900 Celiac disease: Secondary | ICD-10-CM | POA: Diagnosis not present

## 2019-04-08 DIAGNOSIS — Z9189 Other specified personal risk factors, not elsewhere classified: Secondary | ICD-10-CM | POA: Diagnosis not present

## 2019-04-08 DIAGNOSIS — E559 Vitamin D deficiency, unspecified: Secondary | ICD-10-CM | POA: Diagnosis not present

## 2019-04-08 DIAGNOSIS — Z794 Long term (current) use of insulin: Secondary | ICD-10-CM

## 2019-04-08 DIAGNOSIS — E1149 Type 2 diabetes mellitus with other diabetic neurological complication: Secondary | ICD-10-CM | POA: Diagnosis not present

## 2019-04-08 NOTE — Progress Notes (Signed)
Office: [phone]  /  Fax: [phone]   HPI:   Chief Complaint: OBESITY Jessica [name] is here to discuss her progress with her obesity treatment plan. She is on the Category 3 plan with modifications for celiac disease and is following her eating plan approximately 98% of the time. She states she is exercising 0 minutes 0 times per week. Jessica [name] is down 8 lbs and doing well overall. She finds the plan "somewhat boring." She does not like the [employer] milk. She reports doing well with her water intake.  Her weight is 238 lb (108 kg) today and has had a weight loss of 8 pounds over a period of 4 weeks since her last visit. She has lost 8 lbs since starting treatment with [place].  Vitamin D deficiency Jessica [name] has a diagnosis of Vitamin D deficiency. Last Vitamin D 34.0 on 03/12/2019. She is currently taking prescription Vit D and denies nausea, vomiting or muscle weakness.  At risk for osteopenia and osteoporosis Jessica [name] is at higher risk of osteopenia and osteoporosis due to Vitamin D deficiency.   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 [place] states fasting blood sugars range between 120 and 160's; 1-2 hour postprandials ranging between 94 and 120's with lows of 55. Last A1c was 7.2 on 02/20/2019. She has been working on intensive lifestyle modifications including diet, exercise, and weight loss to help control her blood glucose levels.  Celiac Disease Jessica [name] is avoiding celiac disease triggers. All of her labs were normal except her Vitamin D which was slightly low at 34.0.  ASSESSMENT AND PLAN:  Vitamin D deficiency  Type 2 diabetes mellitus with other neurologic complication, with long-term current use of insulin (HCC)  Celiac disease  At risk for osteopenia  Class 2 severe obesity with serious comorbidity and body mass index (BMI) of 39.0 to 39.9 in adult, unspecified obesity type ([place])  PLAN:  Vitamin D Deficiency Jessica [name] was informed that low  Vitamin D levels contributes to fatigue and are associated with obesity, breast, and colon cancer. She agrees to continue to take prescription Vit D @ 50,000 IU every week #4 with 0 refills and will follow-up for routine testing of Vitamin D, at least 2-3 times per year. She was informed of the risk of over-replacement of Vitamin D and agrees to not increase her dose unless she discusses this with [place] first. Jessica [name] agrees to follow-up with our clinic in 2-3 weeks.  At risk for osteopenia and osteoporosis Jessica [name] was given extended  (15 minutes) osteoporosis prevention counseling today. Jessica [name] is at risk for osteopenia and osteoporosis due to her Vitamin D deficiency. She was encouraged to take her Vitamin D and follow her higher calcium diet and increase strengthening exercise to help strengthen her bones and decrease her risk of osteopenia and osteoporosis.  Diabetes II with Neuropathy Jessica [name] has been given diabetes education by myself today. Good blood sugar control is important to decrease the likelihood of diabetic complications such as nephropathy, neuropathy, limb loss, blindness, coronary artery disease, and death. Intensive lifestyle modification including diet, exercise and weight loss were discussed as the first line treatment for diabetes. Jessica [name] will use Insulin NPH regular 48 in a.m. and 13 units at night. She will continue to check her fasting blood sugars and postprandials.  Celiac Disease [name] will continue to avoid gluten. She will follow-up as directed to monitor her progress.  Obesity Jessica [name] is currently in the action stage of change. As such, her goal  is to continue with weight loss efforts. She has agreed to follow the Category 3 plan modified for celiac disease. Jessica [name] will work on meal planning and increasing her water intake. We reviewed her lab results including CMP, microalbumin urine, Vitamin D, Vitamin B12, and A1c. She was instructed on making smart fruit  choices. Jessica [name] has been instructed to work up to a goal of 150 minutes of combined cardio and strengthening exercise per week for weight loss and overall health benefits. We discussed the following Behavioral Modification Strategies today: increasing lean protein intake, decreasing simple carbohydrates, increasing vegetables, increase H20 intake, decrease eating out, no skipping meals, work on meal planning and easy cooking plans, and keeping healthy foods in the home.  Jessica [name] has agreed to follow-up with our clinic in 2-3 weeks. She was informed of the importance of frequent follow-up visits to maximize her success with intensive lifestyle modifications for her multiple health conditions.  ALLERGIES: Allergies  Allergen Reactions   Gluten Meal Other (See Comments)    No wheat or soy   Nsaids Nausea And Vomiting and Other (See Comments)    Cannot tolerate large doses    Soy Allergy    Wheat Bran     MEDICATIONS: Current Outpatient Medications on File Prior to Visit  Medication Sig Dispense Refill   albuterol (PROVENTIL HFA;VENTOLIN HFA) 108 (90 Base) MCG/ACT inhaler Inhale 2 puffs into the lungs every 6 (six) hours as needed for wheezing or shortness of breath.      atorvastatin (LIPITOR) 40 MG tablet TAKE 1 TABLET BY MOUTH EVERY DAY 90 tablet 3   BD INSULIN SYRINGE U/F 31G X 5/16" 1 ML MISC USE UP TO 3 TIMES A DAY 90 each 2   dexlansoprazole (DEXILANT) 60 MG capsule Take 1 capsule (60 mg total) by mouth daily. NEEDS OFFICE VISIT FOR FURTHER REFILLS! 90 capsule 0   Fluticasone-Salmeterol (ADVAIR DISKUS) 100-50 MCG/DOSE AEPB Inhale 1 puff into the lungs 2 (two) times daily as needed (for shortness of breath or wheezing). 180 each 3   gabapentin (NEURONTIN) 600 MG tablet TAKE 1 TABLET IN THE MORNING AND 2 TABLETS AT BEDTIME 270 tablet 2   ibuprofen (ADVIL) 200 MG tablet Take 200 mg by mouth daily.     insulin NPH-regular Human (HUMULIN 70/30) (70-30) 100 UNIT/ML injection 95  units with breakfast, and 32 units with the evening meal. 50 mL 5   olmesartan-hydrochlorothiazide (BENICAR HCT) 40-12.5 MG tablet TAKE 1 TABLET BY MOUTH EVERY DAY 90 tablet 3   ONETOUCH ULTRA test strip USE 2 TIMES DAILY. AND LANCETS 2/DAY 200 strip 3   polyethylene glycol powder ([employer] PURELAX) 17 GM/SCOOP powder DISSOLVE 1 CAPFUL IN AT LEAST 8 OUNCES WATER/JUICE AND DRINK TWICE DAILY 1020 g 2   sucralfate (CARAFATE) 1 GM/10ML suspension Take 10 mLs (1 g total) by mouth 4 (four) times daily. 420 mL 1   tiZANidine (ZANAFLEX) 4 MG capsule TAKE 1 CAPSULE BY MOUTH EVERY DAY IN THE EVENING 30 capsule 0   valACYclovir (VALTREX) 500 MG tablet Take 1 tablet (500 mg total) by mouth daily. 90 tablet 3   No current facility-administered medications on file prior to visit.     PAST MEDICAL HISTORY: Past Medical History:  Diagnosis Date   Anxiety    Arthritis    bilateral knees   Asthma    childhood   Autonomic neuropathy    diabetic   Back pain    Cataract    Celiac disease    Constipation  Depression    Diabetes mellitus    type II, Hemoglobin A1C 9.9 10/05/2011   Diabetic neuropathy (HCC)    Diverticulosis    Fatty liver    Food allergy    Gastroparesis    GERD (gastroesophageal reflux disease)    Hyperlipidemia    Hypertension    IBS (irritable bowel syndrome)    IBS (irritable bowel syndrome)    Joint pain    Kidney stones    Personal history of colonic polyps 03/2010   hyperplastic   Pneumonia 11/03/2015   current   SOB (shortness of breath)     PAST SURGICAL HISTORY: Past Surgical History:  Procedure Laterality Date   APPENDECTOMY     CATARACT EXTRACTION Right 04/29/2018   CATARACT EXTRACTION Left 03/2018   COLONOSCOPY     DILATATION & CURRETTAGE/HYSTEROSCOPY WITH RESECTOCOPE N/A 03/24/2013   Procedure: DILATATION & CURETTAGE, HYSTEROSCOPY WITH RESECTION;  Surgeon: Olga Millers, MD;  Location: [facility];  Service: Gynecology;   Laterality: N/A;   HYSTEROSCOPY W/D&C N/A 11/08/2015   Procedure: DILATATION AND CURETTAGE /HYSTEROSCOPY;  Surgeon: Olga Millers, MD;  Location: [facility];  Service: Gynecology;  Laterality: N/A;   left breast cyst removal  2000   LEFT HEART CATH AND CORONARY ANGIOGRAPHY N/A 10/31/2018   Procedure: LEFT HEART CATH AND CORONARY ANGIOGRAPHY;  Surgeon: Jettie Booze, MD;  Location: [facility];  Service: Cardiovascular;  Laterality: N/A;   TUBAL LIGATION      SOCIAL HISTORY: Social History   Tobacco Use   Smoking status: Never Smoker   Smokeless tobacco: Never Used  Substance Use Topics   Alcohol use: No   Drug use: No    FAMILY HISTORY: Family History  Problem Relation Age of Onset   Hypertension Mother    Colon cancer Sister        dx in her early 55s   Diabetes Sister    Endometrial cancer Sister    Hypertension Brother    Other Father        2 collapsed lungs   COPD Father    Diabetes Father    Heart attack Father    Heart failure Father    High blood pressure Father    ROS: Review of Systems  Gastrointestinal: Negative for nausea and vomiting.       Positive for celiac disease.  Musculoskeletal:       Negative for muscle weakness.  Neurological:       Positive for diabetes mellitus with neuropathy.   PHYSICAL EXAM: Blood pressure (!) 123/54, pulse 73, temperature 98 F (36.7 C), height 5' 5"  (1.651 m), weight 238 lb (108 kg), SpO2 99 %. Body mass index is 39.61 kg/m. Physical Exam Vitals signs reviewed.  Constitutional:      Appearance: Normal appearance. She is obese.  Cardiovascular:     Rate and Rhythm: Normal rate.     Pulses: Normal pulses.  Pulmonary:     Effort: Pulmonary effort is normal.     Breath sounds: Normal breath sounds.  Musculoskeletal: Normal range of motion.  Skin:    General: Skin is warm and dry.  Neurological:     Mental Status: She is alert and oriented to person, place, and time.  Psychiatric:         Behavior: Behavior normal.   RECENT LABS AND TESTS: BMET    Component Value Date/Time   NA 139 03/12/2019 1116   K 4.6 03/12/2019 1116   CL 101  03/12/2019 1116   CO2 27 03/12/2019 1116   GLUCOSE 142 (H) 03/12/2019 1116   GLUCOSE 188 (H) 10/16/2018 0932   BUN 17 03/12/2019 1116   CREATININE 0.98 03/12/2019 1116   CREATININE 0.83 04/04/2012 0832   CALCIUM 9.4 03/12/2019 1116   GFRNONAA 59 (L) 03/12/2019 1116   GFRAA 69 03/12/2019 1116   Lab Results  Component Value Date   HGBA1C 7.2 (A) 02/20/2019   HGBA1C 6.9 (A) 11/20/2018   HGBA1C 6.9 (A) 07/17/2018   HGBA1C 6.5 (A) 03/13/2018   HGBA1C 7.2 (A) 10/25/2017   No results found for: INSULIN CBC    Component Value Date/Time   WBC 4.8 10/28/2018 1218   WBC 3.5 (L) 10/16/2018 0932   RBC 4.17 10/28/2018 1218   RBC 4.10 10/16/2018 0932   HGB 13.4 10/28/2018 1218   HCT 40.4 10/28/2018 1218   PLT 260 10/28/2018 1218   MCV 97 10/28/2018 1218   MCH 32.1 10/28/2018 1218   MCH 32.8 01/07/2016 1400   MCHC 33.2 10/28/2018 1218   MCHC 33.5 10/16/2018 0932   RDW 12.0 10/28/2018 1218   LYMPHSABS 1.4 10/16/2018 0932   MONOABS 0.2 10/16/2018 0932   EOSABS 0.1 10/16/2018 0932   BASOSABS 0.0 10/16/2018 0932   Iron/TIBC/Ferritin/ %Sat    Component Value Date/Time   IRON 74 10/25/2017 1414   TIBC 322 04/04/2012 0832   IRONPCTSAT 19.9 (L) 10/25/2017 1414   IRONPCTSAT 25 04/04/2012 0832   Lipid Panel     Component Value Date/Time   CHOL 134 10/16/2018 0932   TRIG 64.0 10/16/2018 0932   HDL 47.40 10/16/2018 0932   CHOLHDL 3 10/16/2018 0932   VLDL 12.8 10/16/2018 0932   LDLCALC 74 10/16/2018 0932   LDLDIRECT 143.2 11/23/2006 1130   Hepatic Function Panel     Component Value Date/Time   PROT 6.7 03/12/2019 1116   ALBUMIN 4.2 03/12/2019 1116   AST 17 03/12/2019 1116   ALT 13 03/12/2019 1116   ALKPHOS 99 03/12/2019 1116   BILITOT 0.5 03/12/2019 1116   BILIDIR 0.1 10/25/2017 1414   IBILI 0.4 04/04/2012 0832       Component Value Date/Time   TSH 2.15 10/16/2018 0932   TSH 1.27 10/25/2017 1414   TSH 1.46 10/02/2016 1521   Results for VERDIA, BOLT (MRN [id number]) as of 04/08/2019 16:03  Ref. Range 03/12/2019 11:16  Vitamin D, 25-Hydroxy Latest Ref Range: 30.0 - 100.0 ng/mL 34.0   OBESITY BEHAVIORAL INTERVENTION VISIT  Today's visit was #2  Starting weight: 246 lbs Starting date: 03/12/2019 Today's weight: 238 lbs  Today's date: 04/08/2019 Total lbs lost to date: 8     04/08/2019  Height 5' 5"  (1.651 m)  Weight 238 lb (108 kg)  BMI (Calculated) 39.61  BLOOD PRESSURE - SYSTOLIC 782  BLOOD PRESSURE - DIASTOLIC 54   Body Fat % 95.6 %  Total Body Water (lbs) 83.4 lbs   ASK: We discussed the diagnosis of obesity with [place] today and Lyndsy agreed to give [place] permission to discuss obesity behavioral modification therapy today.  ASSESS: Shyasia has the diagnosis of obesity and her BMI today is 39.7. Navneet is in the action stage of change.   ADVISE: Christyna was educated on the multiple health risks of obesity as well as the benefit of weight loss to improve her health. She was advised of the need for long term treatment and the importance of lifestyle modifications to improve her current health and to decrease her risk  of future health problems.  AGREE: Multiple dietary modification options and treatment options were discussed and  Zaakirah agreed to follow the recommendations documented in the above note.  ARRANGE: Averil was educated on the importance of frequent visits to treat obesity as outlined per CMS and USPSTF guidelines and agreed to schedule her next follow up appointment today.  [name], am acting as Location manager for [employer], DO  I have reviewed the above documentation for accuracy and completeness, and I agree with the above. -[name], DO

## 2019-04-10 ENCOUNTER — Ambulatory Visit (INDEPENDENT_AMBULATORY_CARE_PROVIDER_SITE_OTHER): Payer: 59 | Admitting: Bariatrics

## 2019-04-16 DIAGNOSIS — N8500 Endometrial hyperplasia, unspecified: Secondary | ICD-10-CM | POA: Diagnosis not present

## 2019-04-17 ENCOUNTER — Other Ambulatory Visit: Payer: Self-pay | Admitting: Endocrinology

## 2019-04-22 ENCOUNTER — Ambulatory Visit: Admitting: Family Medicine

## 2019-04-28 ENCOUNTER — Encounter (INDEPENDENT_AMBULATORY_CARE_PROVIDER_SITE_OTHER): Payer: Self-pay | Admitting: Bariatrics

## 2019-04-29 ENCOUNTER — Other Ambulatory Visit: Payer: Self-pay | Admitting: Family Medicine

## 2019-04-29 ENCOUNTER — Other Ambulatory Visit (INDEPENDENT_AMBULATORY_CARE_PROVIDER_SITE_OTHER): Payer: Self-pay

## 2019-04-29 DIAGNOSIS — E559 Vitamin D deficiency, unspecified: Secondary | ICD-10-CM

## 2019-04-29 MED ORDER — VITAMIN D (ERGOCALCIFEROL) 1.25 MG (50000 UNIT) PO CAPS: 50000.0000 [IU] | ORAL_CAPSULE | ORAL | 0 refills | Status: DC

## 2019-05-01 ENCOUNTER — Other Ambulatory Visit: Payer: Self-pay | Admitting: Family Medicine

## 2019-05-02 DIAGNOSIS — B029 Zoster without complications: Secondary | ICD-10-CM

## 2019-05-02 HISTORY — DX: Zoster without complications: B02.9

## 2019-05-07 ENCOUNTER — Encounter (INDEPENDENT_AMBULATORY_CARE_PROVIDER_SITE_OTHER): Payer: Self-pay | Admitting: Bariatrics

## 2019-05-07 ENCOUNTER — Ambulatory Visit (INDEPENDENT_AMBULATORY_CARE_PROVIDER_SITE_OTHER): Payer: Medicare Other | Admitting: Bariatrics

## 2019-05-07 ENCOUNTER — Other Ambulatory Visit: Payer: Self-pay

## 2019-05-07 VITALS — BP 144/72 | HR 87 | Temp 98.5°F | Ht 65.0 in | Wt 235.0 lb

## 2019-05-07 DIAGNOSIS — I10 Essential (primary) hypertension: Secondary | ICD-10-CM

## 2019-05-07 DIAGNOSIS — Z6839 Body mass index (BMI) 39.0-39.9, adult: Secondary | ICD-10-CM

## 2019-05-07 DIAGNOSIS — E119 Type 2 diabetes mellitus without complications: Secondary | ICD-10-CM

## 2019-05-08 NOTE — Progress Notes (Signed)
Chief Complaint: OBESITY Jessica [name] is here to discuss her progress with her obesity treatment plan along with follow-up of her obesity related diagnoses. Jessica [name] is on the Category 3 Plan, modified for celiac disease, and states she is following her eating plan approximately 90% of the time. Jessica [name] states she is walking 4,000 steps daily 5 times per week.  Today's visit was #: 3 Starting weight: 246 lbs Starting date: 03/12/2019 Today's weight: 235 lbs Today's date: 05/07/2019 Total lbs lost to date: 11  Total lbs lost since last in-office visit: 3  Interim History: Jessica [name] is down 3 lbs and doing well overall. She states that she has been off with the plan with "sleeping in." She did not get in all of her food.  Subjective:   Type 2 diabetes mellitus without complication, without long-term current use of insulin (Jessica [name]), with other neurological complication.  Mayline is taking Humulin 70/30 and reports some lows. Fasting blood sugars varies 90-180's. Last A1c 7.2 on 02/20/2019.  Essential hypertension. Jessica [name] is taking Benicar/HCT. Blood pressure is reasonably well controlled at 144/72.   Assessment/Plan:   Type 2 diabetes mellitus without complication, without long-term current use of insulin ([place]). Jessica [name] will continue her medications. She was instructed to decrease 40 units in a.m. and 10 units at night. She will check fasting blood sugars and 2-hour postprandials.  Essential hypertension. Jessica [name] will continue her medications.  Class 2 severe obesity with serious comorbidity and body mass index (BMI) of 39.0 to 39.9 in adult, unspecified obesity type (Jessica [name]).  Jessica [name] is currently in the action stage of change. As such, her goal is to continue with weight loss efforts. She has agreed to Category 3 Plan with modifications for celiac disease. She will work on meal planning, increasing protein, and will not skip meals.  We discussed the following exercise goals today: Older adults  should follow the adult guidelines. When older adults cannot meet the adult guidelines, they should be as physically active as their abilities and conditions will allow.  Older adults should do exercises that maintain or improve balance if they are at risk of falling.   We discussed the following behavioral modification strategies today: increasing lean protein intake, decreasing simple carbohydrates, increasing vegetables, increasing water intake, decreasing liquid calories, decreasing eating out, no skipping meals, meal planning and cooking strategies and keeping healthy foods in the home.  Jessica [name] has agreed to follow-up with our clinic in 2-3 weeks. She was informed of the importance of frequent follow-up visits to maximize her success with intensive lifestyle modifications for her multiple health conditions.  Objective:   Blood pressure (!) 144/72, pulse 87, temperature 98.5 F (36.9 C), height 5' 5"  (1.651 m), weight 235 lb (106.6 kg), SpO2 97 %. Body mass index is 39.11 kg/m.  General: Cooperative, alert, well developed, in no acute distress. HEENT: Conjunctivae and lids unremarkable. Neck: No thyromegaly.  Cardiovascular: Regular rhythm.  Lungs: Normal work of breathing. Extremities: No edema.  Neurologic: No focal deficits.   Lab Results  Component Value Date   CREATININE 0.98 03/12/2019   BUN 17 03/12/2019   NA 139 03/12/2019   K 4.6 03/12/2019   CL 101 03/12/2019   CO2 27 03/12/2019   Lab Results  Component Value Date   ALT 13 03/12/2019   AST 17 03/12/2019   ALKPHOS 99 03/12/2019   BILITOT 0.5 03/12/2019   Lab Results  Component Value Date   HGBA1C 7.2 (A) 02/20/2019   HGBA1C  6.9 (A) 11/20/2018   HGBA1C 6.9 (A) 07/17/2018   HGBA1C 6.5 (A) 03/13/2018   HGBA1C 7.2 (A) 10/25/2017   No results found for: INSULIN Lab Results  Component Value Date   TSH 2.15 10/16/2018   Lab Results  Component Value Date   CHOL 134 10/16/2018   HDL 47.40 10/16/2018     LDLCALC 74 10/16/2018   LDLDIRECT 143.2 11/23/2006   TRIG 64.0 10/16/2018   CHOLHDL 3 10/16/2018   Lab Results  Component Value Date   WBC 4.8 10/28/2018   HGB 13.4 10/28/2018   HCT 40.4 10/28/2018   MCV 97 10/28/2018   PLT 260 10/28/2018   Lab Results  Component Value Date   IRON 74 10/25/2017   TIBC 322 04/04/2012   Obesity Behavioral Intervention Visit Documentation for Insurance (15 Minutes):   ASK: We discussed the diagnosis of obesity with Jessica [name] today and Jessica [name] agreed to give [place] permission to discuss obesity behavioral modification therapy today.  ASSESS: Khiara has the diagnosis of obesity and her BMI today is 39.2. Kanijah is in the action stage of change.   ADVISE: Jessica [name] was educated on the multiple health risks of obesity as well as the benefit of weight loss to improve her health. She was advised of the need for long term treatment and the importance of lifestyle modifications to improve her current health and to decrease her risk of future health problems.  AGREE: Multiple dietary modification options and treatment options were discussed and Jessica [name] agreed to follow the recommendations documented in the above note.  ARRANGE: Jessica [name] was educated on the importance of frequent visits to treat obesity as outlined per CMS and USPSTF guidelines and agreed to schedule her next follow up appointment today.  Attestation Statements:   Reviewed by clinician on day of visit: allergies, medications, problem list, medical history, surgical history, family history, social history and previous encounter notes.  This visit occurred during the SARS-CoV-2 public health emergency. Safety protocols were in place, including screening questions prior to the visit, additional usage of staff PPE, and extensive cleaning of exam room while observing appropriate contact time as indicated for disinfecting solutions. (CPT [id number])  I, Michaelene Song, am acting as transcriptionist for  [employer], DO  I have reviewed the above documentation for accuracy and completeness, and I agree with the above. [name], DO

## 2019-05-10 ENCOUNTER — Encounter (INDEPENDENT_AMBULATORY_CARE_PROVIDER_SITE_OTHER): Payer: Self-pay | Admitting: Bariatrics

## 2019-05-20 MED ORDER — DEXILANT 60 MG PO CPDR: 60.0000 mg | DELAYED_RELEASE_CAPSULE | Freq: Every day | ORAL | 0 refills | Status: DC

## 2019-05-20 MED ORDER — POLYETHYLENE GLYCOL 3350 17 GM/SCOOP PO POWD: ORAL | 0 refills | Status: DC

## 2019-05-20 NOTE — Telephone Encounter (Signed)
FYI: Patient is scheduled for 06/03/19 with Amy.

## 2019-05-21 ENCOUNTER — Other Ambulatory Visit: Payer: Self-pay | Admitting: Endocrinology

## 2019-05-25 ENCOUNTER — Other Ambulatory Visit (INDEPENDENT_AMBULATORY_CARE_PROVIDER_SITE_OTHER): Payer: Self-pay | Admitting: Bariatrics

## 2019-05-25 DIAGNOSIS — E559 Vitamin D deficiency, unspecified: Secondary | ICD-10-CM

## 2019-05-29 ENCOUNTER — Ambulatory Visit (INDEPENDENT_AMBULATORY_CARE_PROVIDER_SITE_OTHER): Payer: Medicare Other | Admitting: Endocrinology

## 2019-05-29 ENCOUNTER — Encounter: Payer: Self-pay | Admitting: Endocrinology

## 2019-05-29 ENCOUNTER — Other Ambulatory Visit: Payer: Self-pay

## 2019-05-29 DIAGNOSIS — E118 Type 2 diabetes mellitus with unspecified complications: Secondary | ICD-10-CM

## 2019-05-29 DIAGNOSIS — E1143 Type 2 diabetes mellitus with diabetic autonomic (poly)neuropathy: Secondary | ICD-10-CM | POA: Diagnosis not present

## 2019-05-29 DIAGNOSIS — Z794 Long term (current) use of insulin: Secondary | ICD-10-CM | POA: Diagnosis not present

## 2019-05-29 MED ORDER — HUMULIN 70/30 (70-30) 100 UNIT/ML ~~LOC~~ SUSP: SUBCUTANEOUS | 1 refills | Status: DC

## 2019-05-29 NOTE — Patient Instructions (Addendum)
Please reduce the insulin to 40 units with breakfast, and 10 units with supper.    check your blood sugar twice a day.  vary the time of day when you check, between before the 3 meals, and at bedtime.  also check if you have symptoms of your blood sugar being too high or too low.  please keep a record of the readings and bring it to your next appointment here (or you can bring the meter itself).  You can write it on any piece of paper.  please call us sooner if your blood sugar goes below 70, or if you have a lot of readings over 200.  Please come back for a follow-up appointment in 1 month.

## 2019-05-29 NOTE — Progress Notes (Signed)
Subjective:    Patient ID: Jessica [name], female    DOB: 02-25-1951, 69 y.o.   MRN: [id number]  HPI  telehealth visit today via doxy video visit.  Alternatives to telehealth are presented to this patient, and the patient agrees to the telehealth visit. Pt is advised of the cost of the visit, and agrees to this, also.   Patient is at home, and I am at the office.   Persons attending the telehealth visit: the patient and I Pt returns for f/u of diabetes mellitus:  DM type: Insulin-requiring type 2. Dx'ed: 2000.  Complications: polyneuropathy and autonomic neuropathy.   Therapy: insulin since 2005 GDM: never DKA: never Severe hypoglycemia: never.  Pancreatitis: never.  SDOH: she takes human insulin, due to cost; her ability to care for her DM is compromised by her husband's illness Other: she has done better on a BID insulin regimen.   Interval history: pt states cbg's vary from 46-250.  There is no trend throughout the day.  pt states she feels well in general.  She takes 45 units qam and 15 units qpm.    Past Medical History:  Diagnosis Date  . Anxiety   . Arthritis    bilateral knees  . Asthma    childhood  . Autonomic neuropathy    diabetic  . Back pain   . Cataract   . Celiac disease   . Constipation   . Depression   . Diabetes mellitus    type II, Hemoglobin A1C 9.9 10/05/2011  . Diabetic neuropathy ([place])   . Diverticulosis   . Fatty liver   . Food allergy   . Gastroparesis   . GERD (gastroesophageal reflux disease)   . Hyperlipidemia   . Hypertension   . IBS (irritable bowel syndrome)   . IBS (irritable bowel syndrome)   . Joint pain   . Kidney stones   . Personal history of colonic polyps 03/2010   hyperplastic  . Pneumonia 11/03/2015   current  . SOB (shortness of breath)     Past Surgical History:  Procedure Laterality Date  . APPENDECTOMY    . CATARACT EXTRACTION Right 04/29/2018  . CATARACT EXTRACTION Left 03/2018  . COLONOSCOPY    .  DILATATION & CURRETTAGE/HYSTEROSCOPY WITH RESECTOCOPE N/A 03/24/2013   Procedure: DILATATION & CURETTAGE, HYSTEROSCOPY WITH RESECTION;  Surgeon: Olga Millers, MD;  Location: [facility];  Service: Gynecology;  Laterality: N/A;  . HYSTEROSCOPY WITH D & C N/A 11/08/2015   Procedure: DILATATION AND CURETTAGE /HYSTEROSCOPY;  Surgeon: Olga Millers, MD;  Location: [facility];  Service: Gynecology;  Laterality: N/A;  . left breast cyst removal  2000  . LEFT HEART CATH AND CORONARY ANGIOGRAPHY N/A 10/31/2018   Procedure: LEFT HEART CATH AND CORONARY ANGIOGRAPHY;  Surgeon: Jettie Booze, MD;  Location: [facility];  Service: Cardiovascular;  Laterality: N/A;  . TUBAL LIGATION      Social History   Socioeconomic History  . Marital status: Married    Spouse name: Devar Verde  . Number of children: 2  . Years of education: Not on file  . Highest education level: Not on file  Occupational History  . 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 Smoking status: Never Smoker  . Smokeless tobacco: Never Used  Substance and Sexual Activity  . Alcohol use: No  . Drug use: No  . Sexual activity: Yes  Birth control/protection: Post-menopausal, Surgical  Other Topics Concern  . Not on file  Social History Narrative  . Not on file   Social Determinants of Health   Financial Resource Strain:   . Difficulty of Paying Living Expenses: Not on file  Food Insecurity:   . Worried About Charity fundraiser in the Last Year: Not on file  . Ran Out of Food in the Last Year: Not on file  Transportation Needs:   . Lack of Transportation (Medical): Not on file  . Lack of Transportation (Non-Medical): Not on file  Physical Activity:   . Days of Exercise per Week: Not on file  . Minutes of Exercise per Session: Not on file  Stress:   . Feeling of Stress : Not on file  Social Connections:   . Frequency of Communication with Friends and  Family: Not on file  . Frequency of Social Gatherings with Friends and Family: Not on file  . Attends Religious Services: Not on file  . Active Member of Clubs or Organizations: Not on file  . Attends Archivist Meetings: Not on file  . Marital Status: Not on file  Intimate Partner Violence:   . Fear of Current or Ex-Partner: Not on file  . Emotionally Abused: Not on file  . Physically Abused: Not on file  . Sexually Abused: Not on file    Current Outpatient Medications on File Prior to Visit  Medication Sig Dispense Refill  . albuterol (PROVENTIL HFA;VENTOLIN HFA) 108 (90 Base) MCG/ACT inhaler Inhale 2 puffs into the lungs every 6 (six) hours as needed for wheezing or shortness of breath.     Marland Kitchen atorvastatin (LIPITOR) 40 MG tablet TAKE 1 TABLET BY MOUTH EVERY DAY 90 tablet 3  . BD INSULIN SYRINGE U/F 31G X 5/16" 1 ML MISC USE UP TO 3 TIMES A DAY 90 each 2  . dexlansoprazole (DEXILANT) 60 MG capsule Take 1 capsule (60 mg total) by mouth daily. NEEDS OFFICE VISIT FOR FURTHER REFILLS! 90 capsule 0  . Fluticasone-Salmeterol (ADVAIR DISKUS) 100-50 MCG/DOSE AEPB Inhale 1 puff into the lungs 2 (two) times daily as needed (for shortness of breath or wheezing). 180 each 3  . gabapentin (NEURONTIN) 600 MG tablet TAKE 1 TABLET IN THE MORNING AND 2 TABLETS AT BEDTIME 270 tablet 2  . ibuprofen (ADVIL) 200 MG tablet Take 200 mg by mouth daily.    Marland Kitchen olmesartan-hydrochlorothiazide (BENICAR HCT) 40-12.5 MG tablet TAKE 1 TABLET BY MOUTH EVERY DAY 90 tablet 3  . ONETOUCH ULTRA test strip USE 2 TIMES DAILY. AND LANCETS 2/DAY 200 strip 3  . polyethylene glycol powder ([facility] PURELAX) 17 GM/SCOOP powder DISSOLVE 1 CAPFUL IN AT LEAST 8 OUNCES WATER/JUICE AND DRINK TWICE DAILY 1020 g 0  . sucralfate (CARAFATE) 1 GM/10ML suspension Take 10 mLs (1 g total) by mouth 4 (four) times daily. 420 mL 1  . valACYclovir (VALTREX) 500 MG tablet Take 1 tablet (500 mg total) by mouth daily. 90 tablet 3  . Vitamin D,  Ergocalciferol, (DRISDOL) 1.25 MG (50000 UT) CAPS capsule Take 1 capsule (50,000 Units total) by mouth every 7 (seven) days. 4 capsule 0   No current facility-administered medications on file prior to visit.    Allergies  Allergen Reactions  . Gluten Meal Other (See Comments)    No wheat or soy  . Nsaids Nausea And Vomiting and Other (See Comments)    Cannot tolerate large doses   . Soy Allergy   . Wheat  Bran     Family History  Problem Relation Age of Onset  . Hypertension Mother   . Colon cancer Sister        dx in her early 106s  . Diabetes Sister   . Endometrial cancer Sister   . Hypertension Brother   . Other Father        2 collapsed lungs  . COPD Father   . Diabetes Father   . Heart attack Father   . Heart failure Father   . High blood pressure Father     There were no vitals taken for this visit.   Review of Systems She has recently lost 13 lbs, due to her efforts.      Objective:   Physical Exam      Assessment & Plan:  Insulin-requiring type 2 DM: overcontrolled Hypoglycemia: this limits aggressiveness of glycemic control weight loss, new: this is reducing insulin requirement.   Patient Instructions  Please reduce the insulin to 40 units with breakfast, and 10 units with supper.    check your blood sugar twice a day.  vary the time of day when you check, between before the 3 meals, and at bedtime.  also check if you have symptoms of your blood sugar being too high or too low.  please keep a record of the readings and bring it to your next appointment here (or you can bring the meter itself).  You can write it on any piece of paper.  please call us sooner if your blood sugar goes below 70, or if you have a lot of readings over 200.  Please come back for a follow-up appointment in 1 month.

## 2019-06-03 ENCOUNTER — Ambulatory Visit: Admitting: Physician Assistant

## 2019-06-05 ENCOUNTER — Encounter (INDEPENDENT_AMBULATORY_CARE_PROVIDER_SITE_OTHER): Payer: Self-pay | Admitting: Bariatrics

## 2019-06-05 ENCOUNTER — Other Ambulatory Visit: Payer: Self-pay

## 2019-06-05 ENCOUNTER — Ambulatory Visit (INDEPENDENT_AMBULATORY_CARE_PROVIDER_SITE_OTHER): Payer: Medicare Other | Admitting: Bariatrics

## 2019-06-05 VITALS — BP 131/75 | HR 98 | Temp 98.7°F | Ht 65.0 in | Wt 232.0 lb

## 2019-06-05 DIAGNOSIS — Z6838 Body mass index (BMI) 38.0-38.9, adult: Secondary | ICD-10-CM | POA: Diagnosis not present

## 2019-06-05 DIAGNOSIS — E7800 Pure hypercholesterolemia, unspecified: Secondary | ICD-10-CM | POA: Diagnosis not present

## 2019-06-05 DIAGNOSIS — E559 Vitamin D deficiency, unspecified: Secondary | ICD-10-CM | POA: Diagnosis not present

## 2019-06-05 DIAGNOSIS — M170 Bilateral primary osteoarthritis of knee: Secondary | ICD-10-CM | POA: Diagnosis not present

## 2019-06-05 MED ORDER — VITAMIN D (ERGOCALCIFEROL) 1.25 MG (50000 UNIT) PO CAPS: 50000.0000 [IU] | ORAL_CAPSULE | ORAL | 0 refills | Status: DC

## 2019-06-05 NOTE — Progress Notes (Signed)
Chief Complaint:   OBESITY Jessica [name] is here to discuss her progress with her obesity treatment plan along with follow-up of her obesity related diagnoses. Jessica [name] is on the Category 3 Plan (modified celiac diagnosis) and states she is following her eating plan approximately 95% of the time. Jessica [name] states she is walking 5,000 steps a day 5 times per week.  Today's visit was #: 4 Starting weight: 246 lbs Starting date: 03/12/2019 Today's weight: 232 lbs Today's date: 06/05/2019 Total lbs lost to date: 14 Total lbs lost since last in-office visit: 3  Interim History: Jessica [name] is down 3 lbs and doing well overall.  Subjective:   Osteoarthritis of both knees, unspecified osteoarthritis type. Jessica [name] reports having more pain in her knees.  Hypercholesteremia. Jessica [name] is taking Lipitor with no side effects.  Vitamin D deficiency. Jessica [name] reports minimal sun exposure. Last Vitamin D level 34.0 on 03/12/2019.  Assessment/Plan:   Osteoarthritis of both knees, unspecified osteoarthritis type. Jessica [name] was instructed to avoid pounding exercises.  Hypercholesteremia. Jessica [name] will continue medications as prescribed and will decrease trans and saturated fats.  Vitamin D deficiency. Low Vitamin D level contributes to fatigue and are associated with obesity, breast, and colon cancer. She was given a prescription for Vitamin D, Ergocalciferol, (DRISDOL) 1.25 MG (50000 UNIT) CAPS capsule every week #4 with 0 refills and will follow-up for routine testing of Vitamin D, at least 2-3 times per year to avoid over-replacement.   Class 2 severe obesity with serious comorbidity and body mass index (BMI) of 38.0 to 38.9 in adult, unspecified obesity type ([place]).  Jessica [name] is currently in the action stage of change. As such, her goal is to continue with weight loss efforts. She has agreed to the Category 3 Plan (modified celiac diagnosis).  She will work on meal planning, mindful eating, increasing her water  intake to 64 ounces daily, and decrease salt in her diet.  Exercise goals: Jessica [name] will continue her current exercise regimen.  Behavioral modification strategies: increasing lean protein intake, decreasing simple carbohydrates, increasing vegetables, increasing water intake, decreasing eating out, no skipping meals, meal planning and cooking strategies and keeping healthy foods in the home.  Jessica [name] has agreed to follow-up with our clinic in 2 weeks. She was informed of the importance of frequent follow-up visits to maximize her success with intensive lifestyle modifications for her multiple health conditions.   Objective:   Blood pressure 131/75, pulse 98, temperature 98.7 F (37.1 C), height 5' 5"  (1.651 m), weight 232 lb (105.2 kg), SpO2 98 %. Body mass index is 38.61 kg/m.  General: Cooperative, alert, well developed, in no acute distress. HEENT: Conjunctivae and lids unremarkable. Cardiovascular: Regular rhythm.  Lungs: Normal work of breathing. Neurologic: No focal deficits.   Lab Results  Component Value Date   CREATININE 0.98 03/12/2019   BUN 17 03/12/2019   NA 139 03/12/2019   K 4.6 03/12/2019   CL 101 03/12/2019   CO2 27 03/12/2019   Lab Results  Component Value Date   ALT 13 03/12/2019   AST 17 03/12/2019   ALKPHOS 99 03/12/2019   BILITOT 0.5 03/12/2019   Lab Results  Component Value Date   HGBA1C 7.2 (A) 02/20/2019   HGBA1C 6.9 (A) 11/20/2018   HGBA1C 6.9 (A) 07/17/2018   HGBA1C 6.5 (A) 03/13/2018   HGBA1C 7.2 (A) 10/25/2017   No results found for: INSULIN Lab Results  Component Value Date   TSH 2.15 10/16/2018   Lab Results  Component Value  Date   CHOL 134 10/16/2018   HDL 47.40 10/16/2018   LDLCALC 74 10/16/2018   LDLDIRECT 143.2 11/23/2006   TRIG 64.0 10/16/2018   CHOLHDL 3 10/16/2018   Lab Results  Component Value Date   WBC 4.8 10/28/2018   HGB 13.4 10/28/2018   HCT 40.4 10/28/2018   MCV 97 10/28/2018   PLT 260 10/28/2018   Lab  Results  Component Value Date   IRON 74 10/25/2017   TIBC 322 04/04/2012   Obesity Behavioral Intervention Documentation for Insurance:   Approximately 15 minutes were spent on the discussion below.  ASK: We discussed the diagnosis of obesity with Jessica [name] today and Jessica [name] agreed to give [place] permission to discuss obesity behavioral modification therapy today.  ASSESS: Jessica [name] has the diagnosis of obesity and her BMI today is 38.7. Jessica [name] is in the action stage of change.   ADVISE: Jessica [name] was educated on the multiple health risks of obesity as well as the benefit of weight loss to improve her health. She was advised of the need for long term treatment and the importance of lifestyle modifications to improve her current health and to decrease her risk of future health problems.  AGREE: Multiple dietary modification options and treatment options were discussed and Jessica [name] agreed to follow the recommendations documented in the above note.  ARRANGE: Jessica [name] was educated on the importance of frequent visits to treat obesity as outlined per CMS and USPSTF guidelines and agreed to schedule her next follow up appointment today.  Attestation Statements:   Reviewed by clinician on day of visit: allergies, medications, problem list, medical history, surgical history, family history, social history, and previous encounter notes.  Jessica [name] Dk, am acting as Location manager for [employer], DO   I have reviewed the above documentation for accuracy and completeness, and I agree with the above. [name], DO

## 2019-06-10 ENCOUNTER — Encounter: Payer: Self-pay | Admitting: Physician Assistant

## 2019-06-10 ENCOUNTER — Ambulatory Visit (INDEPENDENT_AMBULATORY_CARE_PROVIDER_SITE_OTHER): Payer: Medicare Other | Admitting: Physician Assistant

## 2019-06-10 DIAGNOSIS — Z800 Family history of malignant neoplasm of digestive organs: Secondary | ICD-10-CM | POA: Diagnosis not present

## 2019-06-10 DIAGNOSIS — K219 Gastro-esophageal reflux disease without esophagitis: Secondary | ICD-10-CM | POA: Diagnosis not present

## 2019-06-10 MED ORDER — POLYETHYLENE GLYCOL 3350 17 GM/SCOOP PO POWD: ORAL | 11 refills | Status: DC

## 2019-06-10 MED ORDER — DEXILANT 60 MG PO CPDR: 60.0000 mg | DELAYED_RELEASE_CAPSULE | Freq: Every day | ORAL | 4 refills | Status: DC

## 2019-06-10 NOTE — Progress Notes (Signed)
Subjective:    Patient ID: Jessica [name], female    DOB: 01-19-1951, 69 y.o.   MRN: [id number]  HPI This service was provided via telemedicine.  Telephone call The patient was located at home. The provider was located in provider's GI office. The patient did consent to this telephone visit and is aware of possible charges with her insurance for this visit. To persons participating in this telemedicine service were the patient and I. Time spent on call; 8 min Jessica [name] is a pleasant 69 year old female, established with Dr. Hilarie Fredrickson with history of celiac disease, chronic GERD, IBS C, history of hyperplastic colon polyps, family history of colon cancer in a sibling and history of diabetic gastroparesis. Visit today for refills of Dexilant and MiraLAX.  She was last seen by Dr. West Carbo in August 2020, and at that time was felt to have had a component of NSAID induced gastritis. Today she says she is doing well as long as she stays on Dexilant 60 mg p.o. every morning and follows her gluten-free diet.  Reflux symptoms are currently well controlled and she has no complaint of dysphagia.  She had EGD in January 2019 which was normal other than finding of a small papule in the stomach which was biopsied and found to be consistent with reactive gastropathy.  Duodenal biopsies were done and showed no villous changes. Patient is also due soon for follow-up colonoscopy, due to family history.  She last had colonoscopy in April 2016 with finding of mild diverticulosis, no polyps and otherwise negative exam. She has no lower GI complaints today, bowel movements fairly normal with use of MiraLAX daily.  No melena or hematochezia. She would like to wait until June to have follow-up colonoscopy, as she works in the school system.  Review of Systems Pertinent positive and negative review of systems were noted in the above HPI section.  All other review of systems was otherwise negative.  Outpatient Encounter  Medications as of 06/10/2019  Medication Sig  . albuterol (PROVENTIL HFA;VENTOLIN HFA) 108 (90 Base) MCG/ACT inhaler Inhale 2 puffs into the lungs every 6 (six) hours as needed for wheezing or shortness of breath.   Marland Kitchen atorvastatin (LIPITOR) 40 MG tablet TAKE 1 TABLET BY MOUTH EVERY DAY  . BD INSULIN SYRINGE U/F 31G X 5/16" 1 ML MISC USE UP TO 3 TIMES A DAY  . dexlansoprazole (DEXILANT) 60 MG capsule Take 1 capsule (60 mg total) by mouth daily.  . Fluticasone-Salmeterol (ADVAIR DISKUS) 100-50 MCG/DOSE AEPB Inhale 1 puff into the lungs 2 (two) times daily as needed (for shortness of breath or wheezing).  . gabapentin (NEURONTIN) 600 MG tablet TAKE 1 TABLET IN THE MORNING AND 2 TABLETS AT BEDTIME  . ibuprofen (ADVIL) 200 MG tablet Take 200 mg by mouth daily.  . insulin NPH-regular Human (HUMULIN 70/30) (70-30) 100 UNIT/ML injection 40 units with breakfast, and 10 units with supper.  . olmesartan-hydrochlorothiazide (BENICAR HCT) 40-12.5 MG tablet TAKE 1 TABLET BY MOUTH EVERY DAY  . ONETOUCH ULTRA test strip USE 2 TIMES DAILY. AND LANCETS 2/DAY  . polyethylene glycol powder ([facility] PURELAX) 17 GM/SCOOP powder DISSOLVE 1 CAPFUL IN AT LEAST 8 OUNCES WATER/JUICE AND DRINK TWICE DAILY  . valACYclovir (VALTREX) 500 MG tablet Take 1 tablet (500 mg total) by mouth daily.  . Vitamin D, Ergocalciferol, (DRISDOL) 1.25 MG (50000 UNIT) CAPS capsule Take 1 capsule (50,000 Units total) by mouth every 7 (seven) days.  . [DISCONTINUED] dexlansoprazole (DEXILANT) 60 MG capsule Take 1  capsule (60 mg total) by mouth daily. NEEDS OFFICE VISIT FOR FURTHER REFILLS!  . [DISCONTINUED] polyethylene glycol powder ([facility] PURELAX) 17 GM/SCOOP powder DISSOLVE 1 CAPFUL IN AT LEAST 8 OUNCES WATER/JUICE AND DRINK TWICE DAILY   No facility-administered encounter medications on file as of 06/10/2019.   Allergies  Allergen Reactions  . Gluten Meal Other (See Comments)    No wheat or soy  . Nsaids Nausea And Vomiting and Other (See  Comments)    Cannot tolerate large doses   . Soy Allergy   . Wheat Bran    Patient Active Problem List   Diagnosis Date Noted  . Abnormal stress test   . Family history of malignant neoplasm of endometrium 10/08/2018  . Foot pain, bilateral 07/17/2018  . Celiac disease 02/14/2018  . Osteopenia 02/14/2018  . Mild intermittent asthma 02/14/2018  . Diabetic neuropathy associated with type 2 diabetes mellitus ([place]) 02/14/2018  . Morbid obesity ([place]) 02/14/2018  . Fibroma of tongue 06/06/2016  . Renal calculi 01/13/2016  . Degenerative arthritis of knee, bilateral 10/29/2015  . Degenerative cervical disc 07/27/2015  . OSA (obstructive sleep apnea) 02/28/2011  . Constipation, slow transit 09/23/2010  . Type 2 diabetes mellitus with complication, with long-term current use of insulin ([place]) 09/23/2010  . FH: colon cancer 09/23/2010  . Personal history of colonic polyps 09/23/2010  . Hypercholesterolemia 01/29/2009  . Diabetic gastroparesis ([place]) 02/27/2007  . Diabetic autonomic neuropathy associated with type 2 diabetes mellitus ([place]) 10/22/2006  . Essential hypertension 10/22/2006   Social History   Socioeconomic History  . Marital status: Married    Spouse name: Devar Kovack  . Number of children: 2  . Years of education: Not on file  . Highest education level: Not on file  Occupational History  . 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 Smoking status: Never Smoker  . Smokeless tobacco: Never Used  Substance and Sexual Activity  . Alcohol use: No  . Drug use: No  . Sexual activity: Yes    Birth control/protection: Post-menopausal, Surgical  Other Topics Concern  . Not on file  Social History Narrative  . Not on file   Social Determinants of Health   Financial Resource Strain:   . Difficulty of Paying Living Expenses: Not on file  Food Insecurity:   . Worried About Charity fundraiser in the Last Year:  Not on file  . Ran Out of Food in the Last Year: Not on file  Transportation Needs:   . Lack of Transportation (Medical): Not on file  . Lack of Transportation (Non-Medical): Not on file  Physical Activity:   . Days of Exercise per Week: Not on file  . Minutes of Exercise per Session: Not on file  Stress:   . Feeling of Stress : Not on file  Social Connections:   . Frequency of Communication with Friends and Family: Not on file  . Frequency of Social Gatherings with Friends and Family: Not on file  . Attends Religious Services: Not on file  . Active Member of Clubs or Organizations: Not on file  . Attends Archivist Meetings: Not on file  . Marital Status: Not on file  Intimate Partner Violence:   . Fear of Current or Ex-Partner: Not on file  . Emotionally Abused: Not on file  . Physically Abused: Not on file  . Sexually Abused: Not on file    Ms. Hainer's  family history includes COPD in her father; Colon cancer in her sister; Diabetes in her father and sister; Endometrial cancer in her sister; Heart attack in her father; Heart failure in her father; High blood pressure in her father; Hypertension in her brother and mother; Other in her father.      Objective:    There were no vitals filed for this visit.  Physical Exam ; not examined, virtual visit       Assessment & Plan:   #15 69 year old female with chronic GERD-controlled on Dexilant 60 mg p.o. daily #2 celiac disease-currently doing well and continues to try to follow a strict gluten-free diet.  She does have GI symptoms including nausea with any gluten intake #3 family history of colon cancer-patient will be due for follow-up colonoscopy in April 2021 #4 adult onset diabetes mellitus insulin-dependent #5 hypertension  #6 chronic constipation-stable on MiraLAX daily  Plan; have refilled Dexilant 60 mg p.o. every morning x1 year. Refill MiraLAX 17 g in 8 ounces of water daily x1 year. We will plan  follow-up colonoscopy in June with Dr. Hilarie Fredrickson.  I have asked her to call to get scheduled in May, and will notify Dr. Vena Rua nurse to plan to get her on the schedule for June 2021.  Irena Gaydos S Adger Cantera PA-C 06/10/2019   Cc: [name], MD

## 2019-06-10 NOTE — Patient Instructions (Signed)
If you are age 69 or older, your body mass index should be between 23-30. Your There is no height or weight on file to calculate BMI. If this is out of the aforementioned range listed, please consider follow up with your Primary Care Provider.  If you are age 11 or younger, your body mass index should be between 19-25. Your There is no height or weight on file to calculate BMI. If this is out of the aformentioned range listed, please consider follow up with your Primary Care Provider.   We have sent the following medications to your pharmacy for you to pick up at your convenience: Dexilant 60 mg daily.  Miralax 17 gm daily in 8oz of water.   Colonoscopy June 2021.

## 2019-06-11 ENCOUNTER — Telehealth: Payer: Self-pay

## 2019-06-11 NOTE — Telephone Encounter (Signed)
Recall is in epic for April.

## 2019-06-11 NOTE — Telephone Encounter (Signed)
-----   Message from Alfredia Ferguson, PA-C sent at 06/10/2019  5:17 PM EST ----- Regarding: colonoscopy Vaughan Basta, I did a virtual visit with this patient today who is Dr.Pyrtle's patient.  She will be due for Colonoscopy this spring.  She would like to be scheduled for colonoscopy in June.  Can you contact her in April to get her scheduled please.. Thanks

## 2019-06-18 ENCOUNTER — Encounter (INDEPENDENT_AMBULATORY_CARE_PROVIDER_SITE_OTHER): Payer: Self-pay

## 2019-06-18 NOTE — Progress Notes (Signed)
Addendum: Reviewed and agree with assessment and management plan. Noel Rodier M, MD  

## 2019-06-19 ENCOUNTER — Ambulatory Visit (INDEPENDENT_AMBULATORY_CARE_PROVIDER_SITE_OTHER): Payer: 59 | Admitting: Bariatrics

## 2019-07-03 ENCOUNTER — Ambulatory Visit: Payer: Medicare Other | Attending: Internal Medicine

## 2019-07-03 DIAGNOSIS — Z23 Encounter for immunization: Secondary | ICD-10-CM | POA: Insufficient documentation

## 2019-07-03 NOTE — Progress Notes (Signed)
   Covid-19 Vaccination Clinic  Name:  Jessica [name]    MRN: [id number] DOB: 03-02-1951  07/03/2019  [name] was observed post Covid-19 immunization for 15 minutes without incident. She was provided with Vaccine Information Sheet and instruction to access the V-Safe system.   [name] was instructed to call 911 with any severe reactions post vaccine: Marland Kitchen Difficulty breathing  . Swelling of face and throat  . A fast heartbeat  . A bad rash all over body  . Dizziness and weakness   Immunizations Administered    Name Date Dose VIS Date Route   Pfizer COVID-19 Vaccine 07/03/2019  3:55 PM 0.3 mL 04/11/2019 Intramuscular   Manufacturer: [place]   Lot: [id number]   [place]: [address]-5

## 2019-07-16 ENCOUNTER — Ambulatory Visit: Payer: Medicare Other | Admitting: Family Medicine

## 2019-07-19 ENCOUNTER — Other Ambulatory Visit: Payer: Self-pay | Admitting: Endocrinology

## 2019-07-24 ENCOUNTER — Other Ambulatory Visit: Payer: Self-pay

## 2019-07-24 ENCOUNTER — Encounter (INDEPENDENT_AMBULATORY_CARE_PROVIDER_SITE_OTHER): Payer: Self-pay | Admitting: Bariatrics

## 2019-07-24 ENCOUNTER — Ambulatory Visit (INDEPENDENT_AMBULATORY_CARE_PROVIDER_SITE_OTHER): Payer: Medicare Other | Admitting: Bariatrics

## 2019-07-24 VITALS — BP 109/53 | HR 75 | Temp 98.3°F | Ht 65.0 in | Wt 226.0 lb

## 2019-07-24 DIAGNOSIS — E559 Vitamin D deficiency, unspecified: Secondary | ICD-10-CM | POA: Diagnosis not present

## 2019-07-24 DIAGNOSIS — E119 Type 2 diabetes mellitus without complications: Secondary | ICD-10-CM

## 2019-07-24 DIAGNOSIS — E7800 Pure hypercholesterolemia, unspecified: Secondary | ICD-10-CM | POA: Diagnosis not present

## 2019-07-24 DIAGNOSIS — Z6837 Body mass index (BMI) 37.0-37.9, adult: Secondary | ICD-10-CM

## 2019-07-24 MED ORDER — VITAMIN D (ERGOCALCIFEROL) 1.25 MG (50000 UNIT) PO CAPS: 50000.0000 [IU] | ORAL_CAPSULE | ORAL | 0 refills | Status: DC

## 2019-07-24 NOTE — Progress Notes (Signed)
Chief Complaint:   OBESITY Jessica [name] is here to discuss her progress with her obesity treatment plan along with follow-up of her obesity related diagnoses. Jessica [name] is on the Category 3 Plan with celiac modifications and states she is following her eating plan approximately 95% of the time. Jessica [name] states she is walking 5,000-6,000 steps 5 times per week.  Today's visit was #: 5 Starting weight: 246 lbs Starting date: 03/12/2019 Today's weight: 226 lbs Today's date: 07/24/2019 Total lbs lost to date: 20 Total lbs lost since last in-office visit: 6  Interim History: Jessica [name] is down an additional 6 lbs.  Subjective:   Hypercholesteremia. Jessica [name] is taking Lipitor.  Vitamin D deficiency. No nausea, vomiting, or muscle weakness. Last Vitamin D 34.0 on 03/12/2019.  Type 2 diabetes mellitus without complication, without long-term current use of insulin ([place]). Jessica [name] reports 2 lows at night in the 50's. She states she is eating enough at night.  Lab Results  Component Value Date   HGBA1C 7.2 (A) 02/20/2019   HGBA1C 6.9 (A) 11/20/2018   HGBA1C 6.9 (A) 07/17/2018   Lab Results  Component Value Date   MICROALBUR 0.7 10/25/2017   LDLCALC 74 10/16/2018   CREATININE 0.98 03/12/2019   No results found for: INSULIN  Assessment/Plan:   Hypercholesteremia. Jessica [name] will avoid trans fats, decrease saturated fats, and increase MUFA's and PUFA's. Comprehensive metabolic panel ordered.  Vitamin D deficiency. Low Vitamin D level contributes to fatigue and are associated with obesity, breast, and colon cancer. She was given a refill on her Vitamin D, Ergocalciferol, (DRISDOL) 1.25 MG (50000 UNIT) CAPS capsule every week #4 with 0 refills and VITAMIN D 25 Hydroxy (Vit-D Deficiency, Fractures) level was ordered.  Type 2 diabetes mellitus without complication, without long-term current use of insulin ([place]). Good blood sugar control is important to decrease the likelihood of diabetic  complications such as nephropathy, neuropathy, limb loss, blindness, coronary artery disease, and death. Intensive lifestyle modification including diet, exercise and weight loss are the first line of treatment for diabetes. Hemoglobin A1c, Comprehensive metabolic panel labs ordered. Jessica [name] will decrease NPH regular to 38 units with breakfast and 10 units at supper.  Class 2 severe obesity with serious comorbidity and body mass index (BMI) of 37.0 to 37.9 in adult, unspecified obesity type ([place]).  Jessica [name] is currently in the action stage of change. As such, her goal is to continue with weight loss efforts. She has agreed to the Category 3 Plan with celiac modifications alternating with the Pescatarian plan + 200 calories.   Exercise goals: Older adults should follow the adult guidelines. When older adults cannot meet the adult guidelines, they should be as physically active as their abilities and conditions will allow.   Behavioral modification strategies: increasing lean protein intake, decreasing simple carbohydrates, increasing vegetables, increasing water intake, decreasing eating out, no skipping meals, meal planning and cooking strategies, keeping healthy foods in the home, ways to avoid boredom eating, ways to avoid night time snacking, better snacking choices and emotional eating strategies.  Jessica [name] has agreed to follow-up with our clinic in 2 weeks. She was informed of the importance of frequent follow-up visits to maximize her success with intensive lifestyle modifications for her multiple health conditions.   Jessica [name] was informed we would discuss her lab results at her next visit unless there is a critical issue that needs to be addressed sooner. Jessica [name] agreed to keep her next visit at the agreed upon time to discuss these results.  Objective:   Blood pressure (!) 109/53, pulse 75, temperature 98.3 F (36.8 C), height 5' 5"  (1.651 m), weight 226 lb (102.5 kg), SpO2 99 %. Body mass index is  37.61 kg/m.  General: Cooperative, alert, well developed, in no acute distress. HEENT: Conjunctivae and lids unremarkable. Cardiovascular: Regular rhythm.  Lungs: Normal work of breathing. Neurologic: No focal deficits.   Lab Results  Component Value Date   CREATININE 0.98 03/12/2019   BUN 17 03/12/2019   NA 139 03/12/2019   K 4.6 03/12/2019   CL 101 03/12/2019   CO2 27 03/12/2019   Lab Results  Component Value Date   ALT 13 03/12/2019   AST 17 03/12/2019   ALKPHOS 99 03/12/2019   BILITOT 0.5 03/12/2019   Lab Results  Component Value Date   HGBA1C 7.2 (A) 02/20/2019   HGBA1C 6.9 (A) 11/20/2018   HGBA1C 6.9 (A) 07/17/2018   HGBA1C 6.5 (A) 03/13/2018   HGBA1C 7.2 (A) 10/25/2017   No results found for: INSULIN Lab Results  Component Value Date   TSH 2.15 10/16/2018   Lab Results  Component Value Date   CHOL 134 10/16/2018   HDL 47.40 10/16/2018   LDLCALC 74 10/16/2018   LDLDIRECT 143.2 11/23/2006   TRIG 64.0 10/16/2018   CHOLHDL 3 10/16/2018   Lab Results  Component Value Date   WBC 4.8 10/28/2018   HGB 13.4 10/28/2018   HCT 40.4 10/28/2018   MCV 97 10/28/2018   PLT 260 10/28/2018   Lab Results  Component Value Date   IRON 74 10/25/2017   TIBC 322 04/04/2012   Obesity Behavioral Intervention Documentation for Insurance:   Approximately 15 minutes were spent on the discussion below.  ASK: We discussed the diagnosis of obesity with Jessica [name] today and Jessica [name] agreed to give [place] permission to discuss obesity behavioral modification therapy today.  ASSESS: Jessica [name] has the diagnosis of obesity and her BMI today is 37.7. Jessica [name] is in the action stage of change.   ADVISE: Jessica [name] was educated on the multiple health risks of obesity as well as the benefit of weight loss to improve her health. She was advised of the need for long term treatment and the importance of lifestyle modifications to improve her current health and to decrease her risk of future health  problems.  AGREE: Multiple dietary modification options and treatment options were discussed and Jessica [name] agreed to follow the recommendations documented in the above note.  ARRANGE: Jessica [name] was educated on the importance of frequent visits to treat obesity as outlined per CMS and USPSTF guidelines and agreed to schedule her next follow up appointment today.  Attestation Statements:   Reviewed by clinician on day of visit: allergies, medications, problem list, medical history, surgical history, family history, social history, and previous encounter notes.  [name], am acting as Location manager for [employer], DO   I have reviewed the above documentation for accuracy and completeness, and I agree with the above. [name], DO

## 2019-07-25 LAB — COMPREHENSIVE METABOLIC PANEL
ALT: 13 IU/L (ref 0–32)
AST: 19 IU/L (ref 0–40)
Albumin/Globulin Ratio: 1.6 (ref 1.2–2.2)
Albumin: 4.1 g/dL (ref 3.8–4.8)
Alkaline Phosphatase: 92 IU/L (ref 39–117)
BUN/Creatinine Ratio: 21 (ref 12–28)
BUN: 22 mg/dL (ref 8–27)
Bilirubin Total: 0.3 mg/dL (ref 0.0–1.2)
CO2: 25 mmol/L (ref 20–29)
Calcium: 9.5 mg/dL (ref 8.7–10.3)
Chloride: 102 mmol/L (ref 96–106)
Creatinine, Ser: 1.07 mg/dL — ABNORMAL HIGH (ref 0.57–1.00)
GFR calc Af Amer: 62 mL/min/{1.73_m2} (ref >59)
GFR calc non Af Amer: 53 mL/min/{1.73_m2} — ABNORMAL LOW (ref >59)
Globulin, Total: 2.6 g/dL (ref 1.5–4.5)
Glucose: 85 mg/dL (ref 65–99)
Potassium: 4.7 mmol/L (ref 3.5–5.2)
Sodium: 141 mmol/L (ref 134–144)
Total Protein: 6.7 g/dL (ref 6.0–8.5)

## 2019-07-25 LAB — HEMOGLOBIN A1C
Est. average glucose Bld gHb Est-mCnc: 146 mg/dL
Hgb A1c MFr Bld: 6.7 % — ABNORMAL HIGH (ref 4.8–5.6)

## 2019-07-25 LAB — VITAMIN D 25 HYDROXY (VIT D DEFICIENCY, FRACTURES): Vit D, 25-Hydroxy: 57.2 ng/mL (ref 30.0–100.0)

## 2019-07-30 ENCOUNTER — Ambulatory Visit

## 2019-08-06 ENCOUNTER — Ambulatory Visit: Payer: Medicare Other | Attending: Internal Medicine

## 2019-08-06 DIAGNOSIS — Z23 Encounter for immunization: Secondary | ICD-10-CM

## 2019-08-06 NOTE — Progress Notes (Signed)
   Covid-19 Vaccination Clinic  Name:  Jessica [name]    MRN: [id number] DOB: October 13, 1950  08/06/2019  [name] was observed post Covid-19 immunization for 15 minutes without incident. She was provided with Vaccine Information Sheet and instruction to access the V-Safe system.   [name] was instructed to call 911 with any severe reactions post vaccine: Marland Kitchen Difficulty breathing  . Swelling of face and throat  . A fast heartbeat  . A bad rash all over body  . Dizziness and weakness   Immunizations Administered    Name Date Dose VIS Date Route   Pfizer COVID-19 Vaccine 08/06/2019  2:56 PM 0.3 mL 04/11/2019 Intramuscular   Manufacturer: [place]   Lot: [id number]   [place]: [address]-5

## 2019-08-12 ENCOUNTER — Encounter (INDEPENDENT_AMBULATORY_CARE_PROVIDER_SITE_OTHER): Payer: Self-pay | Admitting: Bariatrics

## 2019-08-12 ENCOUNTER — Ambulatory Visit (INDEPENDENT_AMBULATORY_CARE_PROVIDER_SITE_OTHER): Payer: 59 | Admitting: Bariatrics

## 2019-08-12 ENCOUNTER — Other Ambulatory Visit: Payer: Self-pay

## 2019-08-12 VITALS — BP 124/72 | HR 73 | Temp 98.0°F | Ht 65.0 in | Wt 225.0 lb

## 2019-08-12 DIAGNOSIS — Z6837 Body mass index (BMI) 37.0-37.9, adult: Secondary | ICD-10-CM

## 2019-08-12 DIAGNOSIS — Z794 Long term (current) use of insulin: Secondary | ICD-10-CM | POA: Diagnosis not present

## 2019-08-12 DIAGNOSIS — E1143 Type 2 diabetes mellitus with diabetic autonomic (poly)neuropathy: Secondary | ICD-10-CM

## 2019-08-12 DIAGNOSIS — E559 Vitamin D deficiency, unspecified: Secondary | ICD-10-CM | POA: Diagnosis not present

## 2019-08-12 MED ORDER — VITAMIN D (ERGOCALCIFEROL) 1.25 MG (50000 UNIT) PO CAPS: ORAL_CAPSULE | ORAL | 0 refills | Status: DC

## 2019-08-12 MED ORDER — VITAMIN D (ERGOCALCIFEROL) 1.25 MG (50000 UNIT) PO CAPS: 50000.0000 [IU] | ORAL_CAPSULE | ORAL | 0 refills | Status: DC

## 2019-08-12 NOTE — Progress Notes (Signed)
Chief Complaint:   OBESITY Jessica [name] is here to discuss her progress with her obesity treatment plan along with follow-up of her obesity related diagnoses. Jessica [name] is on the Category 3 Plan and states she is following her eating plan approximately 90% of the time. Jessica [name] states she is walking 4,000-6,000 steps daily 4 times per week.  Today's visit was #: 6 Starting weight: 246 lbs Starting date: 03/12/2019 Today's weight: 225 lbs Today's date: 08/12/2019 Total lbs lost to date: 21 Total lbs lost since last in-office visit: 1  Interim History: Jessica [name] recently traveled last week to see her brother for the first time in 63 years. She reports difficulty following the plan while traveling. Bioimpedance scale indicates that she is up 1 lb of water. She didn't like the Pescatarian plan and has changed back to the Category 3 meal plan.  Subjective:   Vitamin D deficiency. Last Vitamin D 57.2 on 07/24/2019. Prescription Vitamin D supplementation dose was decreased from once weekly to every other week.  Type 2 diabetes mellitus with diabetic autonomic neuropathy, with long-term current use of insulin (Jessica [name]). Jessica [name] reports a.m. blood sugar ranging between 170 and 190's with postprandials 130 to 140's. She denies any recent episodes of hypoglycemia.  Lab Results  Component Value Date   HGBA1C 6.7 (H) 07/24/2019   HGBA1C 7.2 (A) 02/20/2019   HGBA1C 6.9 (A) 11/20/2018   Lab Results  Component Value Date   MICROALBUR 0.7 10/25/2017   LDLCALC 74 10/16/2018   CREATININE 1.07 (H) 07/24/2019   No results found for: INSULIN  Assessment/Plan:   Vitamin D deficiency. Low Vitamin D level contributes to fatigue and are associated with obesity, breast, and colon cancer. She was given a refill on her Vitamin D, Ergocalciferol, (DRISDOL) 1.25 MG (50000 UNIT) CAPS capsule every other week #4 with 0 refills and will follow-up for routine testing of Vitamin D in 3 months.   Type 2 diabetes  mellitus with diabetic autonomic neuropathy, with long-term current use of insulin (Jessica [name]). Good blood sugar control is important to decrease the likelihood of diabetic complications such as nephropathy, neuropathy, limb loss, blindness, coronary artery disease, and death. Intensive lifestyle modification including diet, exercise and weight loss are the first line of treatment for diabetes. Jessica [name] will continue her current anti-diabetic medication regimen and will continue the Category 3 meal plan. Will check A1c in 3 months.  Class 2 severe obesity with serious comorbidity and body mass index (BMI) of 37.0 to 37.9 in adult, unspecified obesity type (Jessica [name]).  Jessica [name] is currently in the action stage of change. As such, her goal is to continue with weight loss efforts. She has agreed to the Category 3 Plan.   We independently reviewed with the patient labs including CMP, A1c, and Vitamin D.  Exercise goals: Jessica [name] will continue her current exercise regimen.  Behavioral modification strategies: no skipping meals and travel eating strategies.  Jessica [name] has agreed to follow-up with our clinic in 2 weeks. She was informed of the importance of frequent follow-up visits to maximize her success with intensive lifestyle modifications for her multiple health conditions.   Objective:   Blood pressure 124/72, pulse 73, temperature 98 F (36.7 C), height 5' 5"  (1.651 m), weight 225 lb (102.1 kg), SpO2 97 %. Body mass index is 37.44 kg/m.  General: Cooperative, alert, well developed, in no acute distress. HEENT: Conjunctivae and lids unremarkable. Cardiovascular: Regular rhythm.  Lungs: Normal work of breathing. Neurologic: No focal deficits.   Lab  Results  Component Value Date   CREATININE 1.07 (H) 07/24/2019   BUN 22 07/24/2019   NA 141 07/24/2019   K 4.7 07/24/2019   CL 102 07/24/2019   CO2 25 07/24/2019   Lab Results  Component Value Date   ALT 13 07/24/2019   AST 19 07/24/2019   ALKPHOS 92  07/24/2019   BILITOT 0.3 07/24/2019   Lab Results  Component Value Date   HGBA1C 6.7 (H) 07/24/2019   HGBA1C 7.2 (A) 02/20/2019   HGBA1C 6.9 (A) 11/20/2018   HGBA1C 6.9 (A) 07/17/2018   HGBA1C 6.5 (A) 03/13/2018   No results found for: INSULIN Lab Results  Component Value Date   TSH 2.15 10/16/2018   Lab Results  Component Value Date   CHOL 134 10/16/2018   HDL 47.40 10/16/2018   LDLCALC 74 10/16/2018   LDLDIRECT 143.2 11/23/2006   TRIG 64.0 10/16/2018   CHOLHDL 3 10/16/2018   Lab Results  Component Value Date   WBC 4.8 10/28/2018   HGB 13.4 10/28/2018   HCT 40.4 10/28/2018   MCV 97 10/28/2018   PLT 260 10/28/2018   Lab Results  Component Value Date   IRON 74 10/25/2017   TIBC 322 04/04/2012   Obesity Behavioral Intervention Documentation for Insurance:   Approximately 15 minutes were spent on the discussion below.  ASK: We discussed the diagnosis of obesity with Jessica [name] today and Jessica [name] agreed to give [place] permission to discuss obesity behavioral modification therapy today.  ASSESS: Jessica [name] has the diagnosis of obesity and her BMI today is 37.6. Jessica [name] is in the action stage of change.   ADVISE: Jessica [name] was educated on the multiple health risks of obesity as well as the benefit of weight loss to improve her health. She was advised of the need for long term treatment and the importance of lifestyle modifications to improve her current health and to decrease her risk of future health problems.  AGREE: Multiple dietary modification options and treatment options were discussed and Jessica [name] agreed to follow the recommendations documented in the above note.  ARRANGE: Jessica [name] was educated on the importance of frequent visits to treat obesity as outlined per CMS and USPSTF guidelines and agreed to schedule her next follow up appointment today.  Attestation Statements:   Reviewed by clinician on day of visit: allergies, medications, problem list, medical history, surgical  history, family history, social history, and previous encounter notes.  Jessica [name], am acting as Location manager for [employer], DO   I have reviewed the above documentation for accuracy and completeness, and I agree with the above. [name], DO

## 2019-08-13 ENCOUNTER — Encounter: Payer: Self-pay | Admitting: Family Medicine

## 2019-08-13 ENCOUNTER — Encounter (INDEPENDENT_AMBULATORY_CARE_PROVIDER_SITE_OTHER): Payer: Self-pay | Admitting: Bariatrics

## 2019-08-13 ENCOUNTER — Ambulatory Visit (INDEPENDENT_AMBULATORY_CARE_PROVIDER_SITE_OTHER): Payer: Medicare Other | Admitting: Family Medicine

## 2019-08-13 VITALS — BP 110/60 | HR 64 | Temp 97.3°F | Resp 15 | Ht 65.0 in | Wt 230.6 lb

## 2019-08-13 DIAGNOSIS — I10 Essential (primary) hypertension: Secondary | ICD-10-CM

## 2019-08-13 DIAGNOSIS — E118 Type 2 diabetes mellitus with unspecified complications: Secondary | ICD-10-CM | POA: Diagnosis not present

## 2019-08-13 DIAGNOSIS — K3184 Gastroparesis: Secondary | ICD-10-CM

## 2019-08-13 DIAGNOSIS — R600 Localized edema: Secondary | ICD-10-CM

## 2019-08-13 DIAGNOSIS — J301 Allergic rhinitis due to pollen: Secondary | ICD-10-CM

## 2019-08-13 DIAGNOSIS — Z794 Long term (current) use of insulin: Secondary | ICD-10-CM | POA: Diagnosis not present

## 2019-08-13 DIAGNOSIS — J4520 Mild intermittent asthma, uncomplicated: Secondary | ICD-10-CM

## 2019-08-13 DIAGNOSIS — E1143 Type 2 diabetes mellitus with diabetic autonomic (poly)neuropathy: Secondary | ICD-10-CM

## 2019-08-13 MED ORDER — OLMESARTAN MEDOXOMIL 40 MG PO TABS: 40.0000 mg | ORAL_TABLET | Freq: Every day | ORAL | 3 refills | Status: DC

## 2019-08-13 MED ORDER — FLUTICASONE PROPIONATE 50 MCG/ACT NA SUSP: 1.0000 | Freq: Every day | NASAL | 6 refills | Status: DC

## 2019-08-13 MED ORDER — FUROSEMIDE 20 MG PO TABS: 20.0000 mg | ORAL_TABLET | Freq: Every day | ORAL | 3 refills | Status: DC | PRN

## 2019-08-13 NOTE — Progress Notes (Signed)
Subjective  CC:  Chief Complaint  Patient presents with  . Hypertension    ankles are swollen  . Fatigue    Dr. Owens Shark has started patient on Vit D once weekly - has noticed some improvement, but not "back to normal"    HPI: Jessica [name] is a 69 y.o. female who presents to the office today to address the problems listed above in the chief complaint.  Hypertension f/u: Control is good . Pt reports she is doing well. taking medications as instructed, no medication side effects noted, no TIAs, no chest pain on exertion, no dyspnea on exertion,however 4 weeks of dependent swelling of ankles. No pain. Monitoring salt in diet carefully. She denies adverse effects from his BP medications. Compliance with medication is good.   Obesity: improving with healthy weight clinic. Reviewed notes. Reviewed recent lab work.   Diabetes is stable. Per endocrine.   Asthma: no recent flares. No sob or wheezing on meds.  Allergies are active: sneezing and sinus congestion. No meds    Assessment  1. Essential hypertension   2. Type 2 diabetes mellitus with complication, with long-term current use of insulin ([place])   3. Diabetic gastroparesis ([place])   4. Mild intermittent asthma without complication   5. Bilateral lower extremity edema   6. Seasonal allergic rhinitis due to pollen      Plan    Hypertension f/u and dependent edema: BP control is well controlled. However, need to change meds to better treat her leg swelling. Stop hctz component. Continue benicar 40 and add lasix prn. Recheck bmp in 3 weeks and supplement potassium if needed. Most recent level was high normal at 4.7 on arb.   Dm f/u: stable no change.   Obesity congratualted on weight loss  SAR: start flonase  Asthma: well controlled.  Education regarding management of these chronic disease states was given. Management strategies discussed on successive visits include dietary and exercise recommendations, goals of achieving and  maintaining IBW, and lifestyle modifications aiming for adequate sleep and minimizing stressors.   Follow up: Return in about 3 months (around 11/12/2019) for medicare physical with labs, lab visit in 3 weeks.  Orders Placed This Encounter  Procedures  . Basic metabolic panel   Meds ordered this encounter  Medications  . olmesartan (BENICAR) 40 MG tablet    Sig: Take 1 tablet (40 mg total) by mouth daily.    Dispense:  90 tablet    Refill:  3    Stopping the omesartan/hctz. thanks  . furosemide (LASIX) 20 MG tablet    Sig: Take 1 tablet (20 mg total) by mouth daily as needed for fluid or edema.    Dispense:  90 tablet    Refill:  3  . fluticasone (FLONASE) 50 MCG/ACT nasal spray    Sig: Place 1 spray into both nostrils daily.    Dispense:  16 g    Refill:  6      BP Readings from Last 3 Encounters:  08/13/19 110/60  08/12/19 124/72  07/24/19 (!) 109/53   Wt Readings from Last 3 Encounters:  08/13/19 230 lb 9.6 oz (104.6 kg)  08/12/19 225 lb (102.1 kg)  07/24/19 226 lb (102.5 kg)    Lab Results  Component Value Date   CHOL 134 10/16/2018   CHOL 133 10/25/2017   CHOL 143 10/02/2016   Lab Results  Component Value Date   HDL 47.40 10/16/2018   HDL 40.60 10/25/2017   HDL 43.10 10/02/2016  Lab Results  Component Value Date   LDLCALC 74 10/16/2018   LDLCALC 77 10/25/2017   LDLCALC 87 10/02/2016   Lab Results  Component Value Date   TRIG 64.0 10/16/2018   TRIG 75.0 10/25/2017   TRIG 68.0 10/02/2016   Lab Results  Component Value Date   CHOLHDL 3 10/16/2018   CHOLHDL 3 10/25/2017   CHOLHDL 3 10/02/2016   Lab Results  Component Value Date   LDLDIRECT 143.2 11/23/2006   Lab Results  Component Value Date   CREATININE 1.07 (H) 07/24/2019   BUN 22 07/24/2019   NA 141 07/24/2019   K 4.7 07/24/2019   CL 102 07/24/2019   CO2 25 07/24/2019    The 10-year ASCVD risk score Mikey Bussing DC Jr., et al., 2013) is: 12.7%   Values used to calculate the score:      Age: 48 years     Sex: Female     Is Non-Hispanic African American: Yes     Diabetic: Yes     Tobacco smoker: No     Systolic Blood Pressure: 191 mmHg     Is BP treated: Yes     HDL Cholesterol: 47.4 mg/dL     Total Cholesterol: 134 mg/dL  I reviewed the patients updated PMH, FH, and SocHx.    Patient Active Problem List   Diagnosis Date Noted  . Celiac disease 02/14/2018    Priority: High  . Diabetic neuropathy associated with type 2 diabetes mellitus ([place]) 02/14/2018    Priority: High  . Type 2 diabetes mellitus with complication, with long-term current use of insulin ([place]) 09/23/2010    Priority: High  . Hypercholesterolemia 01/29/2009    Priority: High  . Essential hypertension 10/22/2006    Priority: High  . Osteopenia 02/14/2018    Priority: Medium  . Mild intermittent asthma 02/14/2018    Priority: Medium  . Degenerative arthritis of knee, bilateral 10/29/2015    Priority: Medium  . Degenerative cervical disc 07/27/2015    Priority: Medium  . OSA (obstructive sleep apnea) 02/28/2011    Priority: Medium  . Constipation, slow transit 09/23/2010    Priority: Medium  . FH: colon cancer 09/23/2010    Priority: Medium  . Personal history of colonic polyps 09/23/2010    Priority: Medium  . Diabetic gastroparesis ([place]) 02/27/2007    Priority: Medium  . Diabetic autonomic neuropathy associated with type 2 diabetes mellitus ([place]) 10/22/2006    Priority: Medium  . Fibroma of tongue 06/06/2016    Priority: Low  . Abnormal stress test   . Family history of malignant neoplasm of endometrium 10/08/2018  . Foot pain, bilateral 07/17/2018  . Renal calculi 01/13/2016    Allergies: Gluten meal, Nsaids, Soy allergy, and Wheat bran  Social History: Patient  reports that she has never smoked. She has never used smokeless tobacco. She reports that she does not drink alcohol or use drugs.  Current Meds  Medication Sig  . albuterol (PROVENTIL HFA;VENTOLIN HFA) 108 (90 Base)  MCG/ACT inhaler Inhale 2 puffs into the lungs every 6 (six) hours as needed for wheezing or shortness of breath.   Marland Kitchen atorvastatin (LIPITOR) 40 MG tablet TAKE 1 TABLET BY MOUTH EVERY DAY  . BD INSULIN SYRINGE U/F 31G X 5/16" 1 ML MISC USE UP TO 3 TIMES A DAY  . dexlansoprazole (DEXILANT) 60 MG capsule Take 1 capsule (60 mg total) by mouth daily.  . Fluticasone-Salmeterol (ADVAIR DISKUS) 100-50 MCG/DOSE AEPB Inhale 1 puff into the lungs 2 (  two) times daily as needed (for shortness of breath or wheezing).  . gabapentin (NEURONTIN) 600 MG tablet TAKE 1 TABLET IN THE MORNING AND 2 TABLETS AT BEDTIME  . ibuprofen (ADVIL) 200 MG tablet Take 200 mg by mouth daily.  . insulin NPH-regular Human (HUMULIN 70/30) (70-30) 100 UNIT/ML injection 40 units with breakfast, and 10 units with supper.  Marland Kitchen ketoconazole (NIZORAL) 2 % cream Apply 1 application topically 2 (two) times daily as needed for irritation.  Glory Rosebush ULTRA test strip USE 2 TIMES DAILY. AND LANCETS 2/DAY  . polyethylene glycol powder ([employer] PURELAX) 17 GM/SCOOP powder DISSOLVE 1 CAPFUL IN AT LEAST 8 OUNCES WATER/JUICE AND DRINK TWICE DAILY  . valACYclovir (VALTREX) 500 MG tablet Take 1 tablet (500 mg total) by mouth daily.  . Vitamin D, Ergocalciferol, (DRISDOL) 1.25 MG (50000 UNIT) CAPS capsule One capsule every other week.  . [DISCONTINUED] olmesartan-hydrochlorothiazide (BENICAR HCT) 40-12.5 MG tablet TAKE 1 TABLET BY MOUTH EVERY DAY    Review of Systems: Cardiovascular: negative for chest pain, palpitations, leg swelling, orthopnea Respiratory: negative for SOB, wheezing or persistent cough Gastrointestinal: negative for abdominal pain Genitourinary: negative for dysuria or gross hematuria  Objective  Vitals: BP 110/60   Pulse 64   Temp (!) 97.3 F (36.3 C) (Temporal)   Resp 15   Ht 5' 5"  (1.651 m)   Wt 230 lb 9.6 oz (104.6 kg)   SpO2 97%   BMI 38.37 kg/m  General: no acute distress  Psych:  Alert and oriented, normal mood and  affect HEENT:  Normocephalic, atraumatic, supple neck  Cardiovascular:  RRR without murmur. + 1 pitting edema to mid calves  Respiratory:  Good breath sounds bilaterally, CTAB with normal respiratory effort Skin:  Warm, no rashes Neurologic:   Mental status is normal  Commons side effects, risks, benefits, and alternatives for medications and treatment plan prescribed today were discussed, and the patient expressed understanding of the given instructions. Patient is instructed to call or message via MyChart if he/she has any questions or concerns regarding our treatment plan. No barriers to understanding were identified. We discussed Red Flag symptoms and signs in detail. Patient expressed understanding regarding what to do in case of urgent or emergency type symptoms.   Medication list was reconciled, printed and provided to the patient in AVS. Patient instructions and summary information was reviewed with the patient as documented in the AVS. This note was prepared with assistance of Dragon voice recognition software. Occasional wrong-word or sound-a-like substitutions may have occurred due to the inherent limitations of voice recognition software  This visit occurred during the SARS-CoV-2 public health emergency.  Safety protocols were in place, including screening questions prior to the visit, additional usage of staff PPE, and extensive cleaning of exam room while observing appropriate contact time as indicated for disinfecting solutions.

## 2019-08-13 NOTE — Patient Instructions (Addendum)
Please return in 3 months for your annual complete physical; please come fasting.  Lab visit in 3 week to check potassium after starting lasix.   If you have any questions or concerns, please don't hesitate to send me a message via MyChart or call the office at [phone]. Thank you for visiting with [place] today! It's our pleasure caring for you.   Edema  Edema is an abnormal buildup of fluids in the body tissues and under the skin. Swelling of the legs, feet, and ankles is a common symptom that becomes more likely as you get older. Swelling is also common in looser tissues, like around the eyes. When the affected area is squeezed, the fluid may move out of that spot and leave a dent for a few moments. This dent is called pitting edema. There are many possible causes of edema. Eating too much salt (sodium) and being on your feet or sitting for a long time can cause edema in your legs, feet, and ankles. Hot weather may make edema worse. Common causes of edema include:  Heart failure.  Liver or kidney disease.  Weak leg blood vessels.  Cancer.  An injury.  Pregnancy.  Medicines.  Being obese.  Low protein levels in the blood. Edema is usually painless. Your skin may look swollen or shiny. Follow these instructions at home:  Keep the affected body part raised (elevated) above the level of your heart when you are sitting or lying down.  Do not sit still or stand for long periods of time.  Do not wear tight clothing. Do not wear garters on your upper legs.  Exercise your legs to get your circulation going. This helps to move the fluid back into your blood vessels, and it may help the swelling go down.  Wear elastic bandages or support stockings to reduce swelling as told by your health care provider.  Eat a low-salt (low-sodium) diet to reduce fluid as told by your health care provider.  Depending on the cause of your swelling, you may need to limit how much fluid you drink  (fluid restriction).  Take over-the-counter and prescription medicines only as told by your health care provider. Contact a health care provider if:  Your edema does not get better with treatment.  You have heart, liver, or kidney disease and have symptoms of edema.  You have sudden and unexplained weight gain. Get help right away if:  You develop shortness of breath or chest pain.  You cannot breathe when you lie down.  You develop pain, redness, or warmth in the swollen areas.  You have heart, liver, or kidney disease and suddenly get edema.  You have a fever and your symptoms suddenly get worse. Summary  Edema is an abnormal buildup of fluids in the body tissues and under the skin.  Eating too much salt (sodium) and being on your feet or sitting for a long time can cause edema in your legs, feet, and ankles.  Keep the affected body part raised (elevated) above the level of your heart when you are sitting or lying down. This information is not intended to replace advice given to you by your health care provider. Make sure you discuss any questions you have with your health care provider. Document Revised: 09/04/2018 Document Reviewed: 05/20/2016 Elsevier Patient Education  [place].

## 2019-08-17 ENCOUNTER — Other Ambulatory Visit: Payer: Self-pay | Admitting: Endocrinology

## 2019-08-17 NOTE — Telephone Encounter (Signed)
1.  Please schedule f/u appt 2.  Then please refill x 1, pending that appt.  

## 2019-09-02 ENCOUNTER — Encounter (INDEPENDENT_AMBULATORY_CARE_PROVIDER_SITE_OTHER): Payer: Self-pay | Admitting: Bariatrics

## 2019-09-02 ENCOUNTER — Other Ambulatory Visit: Payer: Self-pay

## 2019-09-02 ENCOUNTER — Ambulatory Visit (INDEPENDENT_AMBULATORY_CARE_PROVIDER_SITE_OTHER): Payer: 59 | Admitting: Bariatrics

## 2019-09-02 VITALS — BP 151/76 | HR 73 | Temp 98.4°F | Ht 65.0 in | Wt 228.0 lb

## 2019-09-02 DIAGNOSIS — Z6838 Body mass index (BMI) 38.0-38.9, adult: Secondary | ICD-10-CM | POA: Diagnosis not present

## 2019-09-02 DIAGNOSIS — I10 Essential (primary) hypertension: Secondary | ICD-10-CM | POA: Diagnosis not present

## 2019-09-02 DIAGNOSIS — E559 Vitamin D deficiency, unspecified: Secondary | ICD-10-CM | POA: Diagnosis not present

## 2019-09-02 DIAGNOSIS — E7849 Other hyperlipidemia: Secondary | ICD-10-CM | POA: Diagnosis not present

## 2019-09-02 NOTE — Progress Notes (Signed)
Chief Complaint:   OBESITY Jessica [name] Jessica here to discuss her progress with her obesity treatment plan along with follow-up of her obesity related diagnoses. Jessica [name] Jessica on the Category 3 Plan and states she Jessica following her eating plan approximately 95% of the time. Jessica [name] states she Jessica walking 5,000-6,000 steps 5 times per week.  Today's visit was #: 7 Starting weight: 246 lbs Starting date: 03/12/2019 Today's weight: 228 lbs Today's date: 09/02/2019 Total lbs lost to date: 18 Total lbs lost since last in-office visit: 0  Interim History: Jessica [name] Jessica up 3 lbs since her last visit and reports she added too many starchy foods. Her water weight Jessica up 3 lbs. She does report getting in adequate water.  Subjective:   Other hyperlipidemia. Jessica [name] Jessica taking Lipitor.   Lab Results  Component Value Date   CHOL 134 10/16/2018   HDL 47.40 10/16/2018   LDLCALC 74 10/16/2018   LDLDIRECT 143.2 11/23/2006   TRIG 64.0 10/16/2018   CHOLHDL 3 10/16/2018   Lab Results  Component Value Date   ALT 13 07/24/2019   AST 19 07/24/2019   ALKPHOS 92 07/24/2019   BILITOT 0.3 07/24/2019   The 10-year ASCVD risk score Jessica Bussing DC Jr., Jessica al., Jessica [name]) Jessica: 23.6%   Values used to calculate the score:     Age: 69 years     Sex: Female     Jessica [name]     Diabetic: [name]     Tobacco smoker: No     Systolic Blood Pressure: 614 mmHg     Jessica BP treated: [name]     HDL Cholesterol: 47.4 mg/dL     Total Cholesterol: 134 mg/dL  Vitamin D deficiency. Jessica [name] Jessica taking Vitamin D. Last Vitamin D 57.2 on 07/24/2019.  Essential hypertension. Jessica [name] denies added salt. She does report not getting enough water intake. She Jessica taking Lasix and Benicar.  BP Readings from Last 3 Encounters:  09/02/19 (!) 151/76  08/13/19 110/60  08/12/19 124/72   Lab Results  Component Value Date   CREATININE 1.07 (H) 07/24/2019   CREATININE 0.98 03/12/2019   CREATININE 1.06 (H) 10/28/2018    Assessment/Plan:   Other hyperlipidemia. Cardiovascular risk and specific lipid/LDL goals reviewed.  We discussed several lifestyle modifications today and Zahirah will continue to work on diet, exercise and weight loss efforts. Orders and follow up as documented in patient record. She will continue her medication as directed.  Counseling Intensive lifestyle modifications are the first line treatment for this issue. . Dietary changes: Increase soluble fiber. Decrease simple carbohydrates. . Exercise changes: Moderate to vigorous-intensity aerobic activity 150 minutes per week if tolerated. . Lipid-lowering medications: see documented in medical record.  Vitamin D deficiency. Low Vitamin D level contributes to fatigue and are associated with obesity, breast, and colon cancer. She agrees to continue to take Vitamin D and will follow-up for routine testing of Vitamin D, at least 2-3 times per year to avoid over-replacement.  Essential hypertension. Jessica [name] Jessica working on healthy weight loss and exercise to improve blood pressure control. We will watch for signs of hypotension as she continues her lifestyle modifications. Jessica [name] will continue her medications as directed. She was instructed to elevate her feet above her heart.  Class 2 severe obesity with serious comorbidity and body mass index (BMI) of 38.0 to 38.9 in adult, unspecified obesity type ([place]).  Jessica [name] Jessica currently in the action stage of change. As such, her goal Jessica  to continue with weight loss efforts. She has agreed to the Category 3 Plan.   She will work on meal planning, intentional eating, decreasing carbohydrates, and increasing her daily water intake to 64 oz.  Exercise goals: Older adults should follow the adult guidelines. When older adults cannot meet the adult guidelines, they should be as physically active as their abilities and conditions will allow.   Behavioral modification strategies: increasing lean protein intake,  decreasing simple carbohydrates, increasing vegetables, increasing water intake, decreasing eating out, no skipping meals, meal planning and cooking strategies and keeping healthy foods in the home.  Jessica [name] has agreed to follow-up with our clinic in 2 weeks. She was informed of the importance of frequent follow-up visits to maximize her success with intensive lifestyle modifications for her multiple health conditions.   Objective:   Blood pressure (!) 151/76, pulse 73, temperature 98.4 F (36.9 C), height 5' 5"  (1.651 m), weight 228 lb (103.4 kg), SpO2 98 %. Body mass index Jessica 37.94 kg/m.  General: Cooperative, alert, well developed, in no acute distress. HEENT: Conjunctivae and lids unremarkable. Cardiovascular: Regular rhythm.  Lungs: Normal work of breathing. Neurologic: No focal deficits.   Lab Results  Component Value Date   CREATININE 1.07 (H) 07/24/2019   BUN 22 07/24/2019   NA 141 07/24/2019   K 4.7 07/24/2019   CL 102 07/24/2019   CO2 25 07/24/2019   Lab Results  Component Value Date   ALT 13 07/24/2019   AST 19 07/24/2019   ALKPHOS 92 07/24/2019   BILITOT 0.3 07/24/2019   Lab Results  Component Value Date   HGBA1C 6.7 (H) 07/24/2019   HGBA1C 7.2 (A) 02/20/2019   HGBA1C 6.9 (A) 11/20/2018   HGBA1C 6.9 (A) 07/17/2018   HGBA1C 6.5 (A) 03/13/2018   No results found for: INSULIN Lab Results  Component Value Date   TSH 2.15 10/16/2018   Lab Results  Component Value Date   CHOL 134 10/16/2018   HDL 47.40 10/16/2018   LDLCALC 74 10/16/2018   LDLDIRECT 143.2 11/23/2006   TRIG 64.0 10/16/2018   CHOLHDL 3 10/16/2018   Lab Results  Component Value Date   WBC 4.8 10/28/2018   HGB 13.4 10/28/2018   HCT 40.4 10/28/2018   MCV 97 10/28/2018   PLT 260 10/28/2018   Lab Results  Component Value Date   IRON 74 10/25/2017   TIBC 322 12/05/Jessica [name]   Obesity Behavioral Intervention Documentation for Insurance:   Approximately 15 minutes were spent on the  discussion below.  ASK: We discussed the diagnosis of obesity with Jessica [name] today and Jessica [name] agreed to give [place] permission to discuss obesity behavioral modification therapy today.  ASSESS: Jessica [name] has the diagnosis of obesity and her BMI today Jessica 38.0. Jessica [name] Jessica in the action stage of change.   ADVISE: Jessica [name] was educated on the multiple health risks of obesity as well as the benefit of weight loss to improve her health. She was advised of the need for long term treatment and the importance of lifestyle modifications to improve her current health and to decrease her risk of future health problems.  AGREE: Multiple dietary modification options and treatment options were discussed and Jessica [name] agreed to follow the recommendations documented in the above note.  ARRANGE: Jessica [name] was educated on the importance of frequent visits to treat obesity as outlined per CMS and USPSTF guidelines and agreed to schedule her next follow up appointment today.  Attestation Statements:   Reviewed by clinician on day of visit:  allergies, medications, problem list, medical history, surgical history, family history, social history, and previous encounter notes.  [name], am acting as Location manager for [employer], DO   I have reviewed the above documentation for accuracy and completeness, and I agree with the above. [name], DO

## 2019-09-04 ENCOUNTER — Other Ambulatory Visit: Payer: Self-pay

## 2019-09-04 ENCOUNTER — Other Ambulatory Visit (INDEPENDENT_AMBULATORY_CARE_PROVIDER_SITE_OTHER): Payer: Medicare Other

## 2019-09-04 DIAGNOSIS — I10 Essential (primary) hypertension: Secondary | ICD-10-CM

## 2019-09-04 DIAGNOSIS — Z794 Long term (current) use of insulin: Secondary | ICD-10-CM | POA: Diagnosis not present

## 2019-09-04 DIAGNOSIS — R600 Localized edema: Secondary | ICD-10-CM | POA: Diagnosis not present

## 2019-09-04 DIAGNOSIS — E118 Type 2 diabetes mellitus with unspecified complications: Secondary | ICD-10-CM

## 2019-09-04 LAB — BASIC METABOLIC PANEL
BUN: 18 mg/dL (ref 6–23)
CO2: 32 mEq/L (ref 19–32)
Calcium: 9 mg/dL (ref 8.4–10.5)
Chloride: 104 mEq/L (ref 96–112)
Creatinine, Ser: 1.02 mg/dL (ref 0.40–1.20)
GFR: 65.03 mL/min (ref >60.00)
Glucose, Bld: 105 mg/dL — ABNORMAL HIGH (ref 70–99)
Potassium: 4.2 mEq/L (ref 3.5–5.1)
Sodium: 138 mEq/L (ref 135–145)

## 2019-09-16 ENCOUNTER — Other Ambulatory Visit: Payer: Self-pay | Admitting: Endocrinology

## 2019-09-17 ENCOUNTER — Encounter: Payer: Self-pay | Admitting: Endocrinology

## 2019-09-18 ENCOUNTER — Other Ambulatory Visit: Payer: Self-pay

## 2019-09-18 DIAGNOSIS — E1143 Type 2 diabetes mellitus with diabetic autonomic (poly)neuropathy: Secondary | ICD-10-CM

## 2019-09-18 DIAGNOSIS — Z794 Long term (current) use of insulin: Secondary | ICD-10-CM

## 2019-09-18 MED ORDER — HUMULIN 70/30 (70-30) 100 UNIT/ML ~~LOC~~ SUSP: SUBCUTANEOUS | 0 refills | Status: DC

## 2019-09-25 ENCOUNTER — Encounter (INDEPENDENT_AMBULATORY_CARE_PROVIDER_SITE_OTHER): Payer: Self-pay | Admitting: Bariatrics

## 2019-09-25 ENCOUNTER — Other Ambulatory Visit: Payer: Self-pay

## 2019-09-25 ENCOUNTER — Ambulatory Visit (INDEPENDENT_AMBULATORY_CARE_PROVIDER_SITE_OTHER): Payer: Medicare Other | Admitting: Bariatrics

## 2019-09-25 VITALS — BP 129/68 | HR 69 | Temp 98.6°F | Ht 65.0 in | Wt 226.0 lb

## 2019-09-25 DIAGNOSIS — E669 Obesity, unspecified: Secondary | ICD-10-CM

## 2019-09-25 DIAGNOSIS — E559 Vitamin D deficiency, unspecified: Secondary | ICD-10-CM | POA: Diagnosis not present

## 2019-09-25 DIAGNOSIS — Z9189 Other specified personal risk factors, not elsewhere classified: Secondary | ICD-10-CM | POA: Diagnosis not present

## 2019-09-25 DIAGNOSIS — E1169 Type 2 diabetes mellitus with other specified complication: Secondary | ICD-10-CM | POA: Diagnosis not present

## 2019-09-25 DIAGNOSIS — Z6837 Body mass index (BMI) 37.0-37.9, adult: Secondary | ICD-10-CM | POA: Diagnosis not present

## 2019-09-25 MED ORDER — VITAMIN D (ERGOCALCIFEROL) 1.25 MG (50000 UNIT) PO CAPS: ORAL_CAPSULE | ORAL | 0 refills | Status: DC

## 2019-09-25 NOTE — Progress Notes (Signed)
Chief Complaint:   OBESITY Jessica [name] is here to discuss her progress with her obesity treatment plan along with follow-up of her obesity related diagnoses. Jessica [name] is on the Category 3 Plan and states she is following her eating plan approximately 95% of the time. [name] states she is walking 8,000-9,000 steps 5 times per week.  Today's visit was #: 8 Starting weight: 246 lbs Starting date: 03/12/2019 Today's weight: 226 lbs Today's date: 09/25/2019 Total lbs lost to date: 20 Total lbs lost since last in-office visit: 2  Interim History: Jessica [name] is down 2 lbs and doing well overall. She has gone down about 3% of body weight. She reports doing better with her water intake.  Subjective:   Vitamin D deficiency. No nausea, vomiting, or muscle weakness. Last Vitamin D 57.2 on 07/24/2019.  Type 2 diabetes mellitus with obesity ([place]). Jessica [name] is taking NPH insulin 70/30, 40 units with breakfast and 10 units with supper. Fasting blood sugars range between 110 and 120. She reports having 1 low of 55.  Lab Results  Component Value Date   HGBA1C 6.7 (H) 07/24/2019   HGBA1C 7.2 (A) 02/20/2019   HGBA1C 6.9 (A) 11/20/2018   Lab Results  Component Value Date   MICROALBUR 0.7 10/25/2017   [place] 74 10/16/2018   CREATININE 1.02 09/04/2019   No results found for: INSULIN  At risk for osteoporosis. Enis is at higher risk of osteopenia and osteoporosis due to Vitamin D deficiency.   Assessment/Plan:   Vitamin D deficiency. Low Vitamin D level contributes to fatigue and are associated with obesity, breast, and colon cancer. She was given a prescription for Vitamin D, Ergocalciferol, (DRISDOL) 1.25 MG (50000 UNIT) CAPS capsule every week #4 with 0 refills and will follow-up for routine testing of Vitamin D, at least 2-3 times per year to avoid over-replacement.    Type 2 diabetes mellitus with obesity ([place]). Good blood sugar control is important to decrease the likelihood of diabetic  complications such as nephropathy, neuropathy, limb loss, blindness, coronary artery disease, and death. Intensive lifestyle modification including diet, exercise and weight loss are the first line of treatment for diabetes. Belladonna will continue her medication as directed. She does have glucose tablets.  At risk for osteoporosis. Jessica [name] was given approximately 15 minutes of osteoporosis prevention counseling today. Jessica [name] is at risk for osteopenia and osteoporosis due to her Vitamin D deficiency. She was encouraged to take her Vitamin D and follow her higher calcium diet and increase strengthening exercise to help strengthen her bones and decrease her risk of osteopenia and osteoporosis.  Repetitive spaced learning was employed today to elicit superior memory formation and behavioral change.  Class 2 severe obesity with serious comorbidity and body mass index (BMI) of 37.0 to 37.9 in adult, unspecified obesity type ([place]).  Jessica [name] is currently in the action stage of change. As such, her goal is to continue with weight loss efforts. She has agreed to the Category 3 Plan.   She will work on meal planning, intentional eating, and keeping her water intake up.  Exercise goals: Jessica [name] will continue to walk for exercise.  Behavioral modification strategies: increasing lean protein intake, decreasing simple carbohydrates, increasing vegetables, increasing water intake, decreasing eating out, no skipping meals, meal planning and cooking strategies, keeping healthy foods in the home and planning for success.  Jessica [name] has agreed to follow-up with our clinic in 3-4 weeks. She was informed of the importance of frequent follow-up visits to maximize her  success with intensive lifestyle modifications for her multiple health conditions.   Objective:   Blood pressure 129/68, pulse 69, temperature 98.6 F (37 C), temperature source Oral, height 5' 5"  (1.651 m), weight 226 lb (102.5 kg), SpO2 97 %. Body mass index  is 37.61 kg/m.  General: Cooperative, alert, well developed, in no acute distress. HEENT: Conjunctivae and lids unremarkable. Cardiovascular: Regular rhythm.  Lungs: Normal work of breathing. Neurologic: No focal deficits.   Lab Results  Component Value Date   CREATININE 1.02 09/04/2019   BUN 18 09/04/2019   NA 138 09/04/2019   K 4.2 09/04/2019   CL 104 09/04/2019   CO2 32 09/04/2019   Lab Results  Component Value Date   ALT 13 07/24/2019   AST 19 07/24/2019   ALKPHOS 92 07/24/2019   BILITOT 0.3 07/24/2019   Lab Results  Component Value Date   HGBA1C 6.7 (H) 07/24/2019   HGBA1C 7.2 (A) 02/20/2019   HGBA1C 6.9 (A) 11/20/2018   HGBA1C 6.9 (A) 07/17/2018   HGBA1C 6.5 (A) 03/13/2018   No results found for: INSULIN Lab Results  Component Value Date   TSH 2.15 10/16/2018   Lab Results  Component Value Date   CHOL 134 10/16/2018   HDL 47.40 10/16/2018   LDLCALC 74 10/16/2018   LDLDIRECT 143.2 11/23/2006   TRIG 64.0 10/16/2018   CHOLHDL 3 10/16/2018   Lab Results  Component Value Date   WBC 4.8 10/28/2018   HGB 13.4 10/28/2018   HCT 40.4 10/28/2018   MCV 97 10/28/2018   PLT 260 10/28/2018   Lab Results  Component Value Date   IRON 74 10/25/2017   TIBC 322 04/04/2012   Attestation Statements:   Reviewed by clinician on day of visit: allergies, medications, problem list, medical history, surgical history, family history, social history, and previous encounter notes.  [name], am acting as Location manager for [employer], DO   I have reviewed the above documentation for accuracy and completeness, and I agree with the above. [name], DO

## 2019-09-30 ENCOUNTER — Ambulatory Visit: Payer: Medicare Other

## 2019-09-30 ENCOUNTER — Other Ambulatory Visit: Payer: Self-pay

## 2019-09-30 VITALS — Ht 65.0 in | Wt 228.0 lb

## 2019-09-30 DIAGNOSIS — Z800 Family history of malignant neoplasm of digestive organs: Secondary | ICD-10-CM

## 2019-09-30 MED ORDER — SUTAB 1479-225-188 MG PO TABS: 1.0000 | ORAL_TABLET | ORAL | 0 refills | Status: DC

## 2019-09-30 NOTE — Progress Notes (Signed)
Denies allergies to eggs or soy products. Denies complication of anesthesia or sedation. Denies use of weight loss medication. Denies use of O2.     Patient completed Covid Vaciinations on 08/06/19.

## 2019-10-06 DIAGNOSIS — Z8049 Family history of malignant neoplasm of other genital organs: Secondary | ICD-10-CM | POA: Diagnosis not present

## 2019-10-09 ENCOUNTER — Other Ambulatory Visit: Payer: Self-pay | Admitting: Podiatry

## 2019-10-13 ENCOUNTER — Telehealth: Payer: Self-pay | Admitting: Family Medicine

## 2019-10-13 ENCOUNTER — Other Ambulatory Visit: Payer: Self-pay

## 2019-10-13 MED ORDER — ALBUTEROL SULFATE HFA 108 (90 BASE) MCG/ACT IN AERS: 2.0000 | INHALATION_SPRAY | Freq: Four times a day (QID) | RESPIRATORY_TRACT | 3 refills | Status: DC | PRN

## 2019-10-13 NOTE — Telephone Encounter (Signed)
°  LAST APPOINTMENT DATE: 08/13/2019   NEXT APPOINTMENT DATE:@10 /18/2021  MEDICATION:albuterol (PROVENTIL HFA;VENTOLIN HFA) 108 (90 Base) MCG/ACT inhaler  PHARMACY:[id number] [place], [place] - [address]  COMMENTS:Patient states she is out of the rescue inhaler   **Let patient know to contact pharmacy at the end of the day to make sure medication is ready. **  ** Please notify patient to allow 48-72 hours to process**  **Encourage patient to contact the pharmacy for refills or they can [facility]  CLINICAL FILLS OUT ALL BELOW:   LAST REFILL:  QTY:  REFILL DATE:    OTHER COMMENTS:    Okay for refill?  Please advise

## 2019-10-13 NOTE — Telephone Encounter (Signed)
Refill sent to pharmacy.   

## 2019-10-14 ENCOUNTER — Other Ambulatory Visit: Payer: Self-pay

## 2019-10-14 ENCOUNTER — Encounter: Payer: Self-pay | Admitting: Internal Medicine

## 2019-10-14 ENCOUNTER — Ambulatory Visit (AMBULATORY_SURGERY_CENTER): Payer: Medicare Other | Admitting: Internal Medicine

## 2019-10-14 VITALS — BP 160/61 | HR 53 | Temp 96.0°F | Resp 14 | Ht 65.0 in | Wt 228.0 lb

## 2019-10-14 DIAGNOSIS — K635 Polyp of colon: Secondary | ICD-10-CM

## 2019-10-14 DIAGNOSIS — Z1211 Encounter for screening for malignant neoplasm of colon: Secondary | ICD-10-CM

## 2019-10-14 DIAGNOSIS — D123 Benign neoplasm of transverse colon: Secondary | ICD-10-CM

## 2019-10-14 DIAGNOSIS — Z800 Family history of malignant neoplasm of digestive organs: Secondary | ICD-10-CM

## 2019-10-14 DIAGNOSIS — D124 Benign neoplasm of descending colon: Secondary | ICD-10-CM

## 2019-10-14 MED ORDER — SODIUM CHLORIDE 0.9 % IV SOLN
500.0000 mL | Freq: Once | INTRAVENOUS | Status: DC
Start: 2019-10-14 — End: 2019-10-14

## 2019-10-14 NOTE — Progress Notes (Signed)
Pt's states no medical or surgical changes since previsit or office visit. 

## 2019-10-14 NOTE — Progress Notes (Signed)
To PACU, VSS. Report to [email or web address] 

## 2019-10-14 NOTE — Op Note (Signed)
[facility] Patient Name: Jessica [name] Procedure Date: 10/14/2019 7:58 AM MRN: [id number] Endoscopist: Jerene Bears , MD Age: 68 Referring MD:  Date of Birth: May 27, 1950 Gender: Female Account #: [id number] Procedure:                Colonoscopy Indications:              Screening in patient at increased risk: Family                            history of 1st-degree relative with colorectal                            cancer, Last colonoscopy: April 2016 Medicines:                Monitored Anesthesia Care Procedure:                Pre-Anesthesia Assessment:                           - Prior to the procedure, a History and Physical                            was performed, and patient medications and                            allergies were reviewed. The patient's tolerance of                            previous anesthesia was also reviewed. The risks                            and benefits of the procedure and the sedation                            options and risks were discussed with the patient.                            All questions were answered, and informed consent                            was obtained. Prior Anticoagulants: The patient has                            taken no previous anticoagulant or antiplatelet                            agents. ASA Grade Assessment: III - A patient with                            severe systemic disease. After reviewing the risks                            and benefits, the patient was deemed in  satisfactory condition to undergo the procedure.                           After obtaining informed consent, the colonoscope                            was passed under direct vision. Throughout the                            procedure, the patient's blood pressure, pulse, and                            oxygen saturations were monitored continuously. The                            Colonoscope was introduced  through the anus and                            advanced to the cecum, identified by appendiceal                            orifice and ileocecal valve. The colonoscopy was                            performed without difficulty. The patient tolerated                            the procedure well. The quality of the bowel                            preparation was excellent. Scope In: 8:08:05 AM Scope Out: 8:27:08 AM Total Procedure Duration: 0 hours 19 minutes 3 seconds  Findings:                 The digital rectal exam was normal.                           A 4 mm polyp was found in the transverse colon. The                            polyp was sessile. The polyp was removed with a                            cold snare. Resection and retrieval were complete.                           A 5 mm polyp was found in the descending colon. The                            polyp was sessile. The polyp was removed with a                            cold snare. Resection and retrieval were complete.  Scattered small-mouthed diverticula were found in                            the sigmoid colon, descending colon and ascending                            colon.                           The retroflexed view of the distal rectum and anal                            verge was normal and showed no anal or rectal                            abnormalities. Complications:            No immediate complications. Estimated Blood Loss:     Estimated blood loss was minimal. Impression:               - One 4 mm polyp in the transverse colon, removed                            with a cold snare. Resected and retrieved.                           - One 5 mm polyp in the descending colon, removed                            with a cold snare. Resected and retrieved.                           - Diverticulosis in the sigmoid colon, in the                            descending colon and in the  ascending colon.                           - The distal rectum and anal verge are normal on                            retroflexion view. Recommendation:           - Patient has a contact number available for                            emergencies. The signs and symptoms of potential                            delayed complications were discussed with the                            patient. Return to normal activities tomorrow.  Written discharge instructions were provided to the                            patient.                           - Resume previous diet.                           - Continue present medications.                           - Await pathology results.                           - Repeat colonoscopy in 5 years for                            screening/surveillance purposes in setting of                            family history of colorectal cancer. Jerene Bears, MD 10/14/2019 8:31:02 AM This report has been signed electronically.

## 2019-10-14 NOTE — Progress Notes (Signed)
Called to room to assist during endoscopic procedure.  Patient ID and intended procedure confirmed with present staff. Received instructions for my participation in the procedure from the performing physician.  

## 2019-10-14 NOTE — Patient Instructions (Signed)
Information on polyps and diverticulosis given to you today.  Await pathology results.  Continue present medications and diet.  YOU HAD AN ENDOSCOPIC PROCEDURE TODAY AT [facility]:   Refer to the procedure report that was given to you for any specific questions about what was found during the examination.  If the procedure report does not answer your questions, please call your gastroenterologist to clarify.  If you requested that your care partner not be given the details of your procedure findings, then the procedure report has been included in a sealed envelope for you to review at your convenience later.  YOU SHOULD EXPECT: Some feelings of bloating in the abdomen. Passage of more gas than usual.  Walking can help get rid of the air that was put into your GI tract during the procedure and reduce the bloating. If you had a lower endoscopy (such as a colonoscopy or flexible sigmoidoscopy) you may notice spotting of blood in your stool or on the toilet paper. If you underwent a bowel prep for your procedure, you may not have a normal bowel movement for a few days.  Please Note:  You might notice some irritation and congestion in your nose or some drainage.  This is from the oxygen used during your procedure.  There is no need for concern and it should clear up in a day or so.  SYMPTOMS TO REPORT IMMEDIATELY:   Following lower endoscopy (colonoscopy or flexible sigmoidoscopy):  Excessive amounts of blood in the stool  Significant tenderness or worsening of abdominal pains  Swelling of the abdomen that is new, acute  Fever of 100F or higher  For urgent or emergent issues, a gastroenterologist can be reached at any hour by calling [phone]. Do not use MyChart messaging for urgent concerns.    DIET:  We do recommend a small meal at first, but then you may proceed to your regular diet.  Drink plenty of fluids but you should avoid alcoholic beverages for 24  hours.  ACTIVITY:  You should plan to take it easy for the rest of today and you should NOT DRIVE or use heavy machinery until tomorrow (because of the sedation medicines used during the test).    FOLLOW UP: Our staff will call the number listed on your records 48-72 hours following your procedure to check on you and address any questions or concerns that you may have regarding the information given to you following your procedure. If we do not reach you, we will leave a message.  We will attempt to reach you two times.  During this call, we will ask if you have developed any symptoms of COVID 19. If you develop any symptoms (ie: fever, flu-like symptoms, shortness of breath, cough etc.) before then, please call [phone].  If you test positive for Covid 19 in the 2 weeks post procedure, please call and report this information to [place].    If any biopsies were taken you will be contacted by phone or by letter within the next 1-3 weeks.  Please call us at [phone] if you have not heard about the biopsies in 3 weeks.    SIGNATURES/CONFIDENTIALITY: You and/or your care partner have signed paperwork which will be entered into your electronic medical record.  These signatures attest to the fact that that the information above on your After Visit Summary has been reviewed and is understood.  Full responsibility of the confidentiality of this discharge information lies with you and/or your  care-partner.

## 2019-10-16 ENCOUNTER — Telehealth: Payer: Self-pay

## 2019-10-16 NOTE — Telephone Encounter (Signed)
  Follow up Call-  Call back number 10/14/2019 05/22/2017  Post procedure Call Back phone  # [phone]  Permission to leave phone message Yes Yes  Some recent data might be hidden     Patient questions:  Do you have a fever, pain , or abdominal swelling? No. Pain Score  0 *  Have you tolerated food without any problems? Yes.    Have you been able to return to your normal activities? Yes.    Do you have any questions about your discharge instructions: Diet   No. Medications  No. Follow up visit  No.  Do you have questions or concerns about your Care? No.  Actions: * If pain score is 4 or above: No action needed, pain <4.  1. Have you developed a fever since your procedure? no  2.   Have you had an respiratory symptoms (SOB or cough) since your procedure? no  3.   Have you tested positive for COVID 19 since your procedure no  4.   Have you had any family members/close contacts diagnosed with the COVID 19 since your procedure?  no   If yes to any of these questions please route to Joylene John, RN and Erenest Rasher, RN

## 2019-10-17 ENCOUNTER — Other Ambulatory Visit: Payer: Self-pay | Admitting: Endocrinology

## 2019-10-17 DIAGNOSIS — Z794 Long term (current) use of insulin: Secondary | ICD-10-CM

## 2019-10-17 DIAGNOSIS — E1143 Type 2 diabetes mellitus with diabetic autonomic (poly)neuropathy: Secondary | ICD-10-CM

## 2019-10-19 ENCOUNTER — Other Ambulatory Visit: Payer: Self-pay | Admitting: Family Medicine

## 2019-10-20 ENCOUNTER — Encounter: Payer: Self-pay | Admitting: Internal Medicine

## 2019-10-23 ENCOUNTER — Other Ambulatory Visit: Payer: Self-pay

## 2019-10-23 ENCOUNTER — Ambulatory Visit (INDEPENDENT_AMBULATORY_CARE_PROVIDER_SITE_OTHER): Payer: Medicare Other | Admitting: Bariatrics

## 2019-10-23 ENCOUNTER — Encounter (INDEPENDENT_AMBULATORY_CARE_PROVIDER_SITE_OTHER): Payer: Self-pay | Admitting: Bariatrics

## 2019-10-23 VITALS — BP 141/77 | HR 73 | Temp 98.4°F | Ht 65.0 in | Wt 220.0 lb

## 2019-10-23 DIAGNOSIS — E559 Vitamin D deficiency, unspecified: Secondary | ICD-10-CM | POA: Diagnosis not present

## 2019-10-23 DIAGNOSIS — E669 Obesity, unspecified: Secondary | ICD-10-CM | POA: Diagnosis not present

## 2019-10-23 DIAGNOSIS — Z9189 Other specified personal risk factors, not elsewhere classified: Secondary | ICD-10-CM

## 2019-10-23 DIAGNOSIS — E1169 Type 2 diabetes mellitus with other specified complication: Secondary | ICD-10-CM

## 2019-10-23 DIAGNOSIS — Z6836 Body mass index (BMI) 36.0-36.9, adult: Secondary | ICD-10-CM | POA: Diagnosis not present

## 2019-10-23 MED ORDER — VITAMIN D (ERGOCALCIFEROL) 1.25 MG (50000 UNIT) PO CAPS: ORAL_CAPSULE | ORAL | 0 refills | Status: DC

## 2019-10-27 ENCOUNTER — Encounter (INDEPENDENT_AMBULATORY_CARE_PROVIDER_SITE_OTHER): Payer: Self-pay | Admitting: Bariatrics

## 2019-10-27 NOTE — Progress Notes (Signed)
Chief Complaint:   OBESITY Jessica [name] is here to discuss her progress with her obesity treatment plan along with follow-up of her obesity related diagnoses. Jessica [name] is on the Category 3 Plan and states she is following her eating plan approximately 98% of the time. Jessica [name] states she is walking 5,000-6,000 steps 5 times per week.  Today's visit was #: 9 Starting weight: 246 lbs Starting date: 03/12/2019 Today's weight: 220 lbs Today's date: 10/23/2019 Total lbs lost to date: 26 Total lbs lost since last in-office visit: 6  Interim History: Jessica [name] is down an another 6 lbs and has done well overall. She has had more cravings over the last 2 weeks. She reports doing better with her water intake.  Subjective:   Vitamin D deficiency. No nausea, vomiting, or muscle weakness.    Ref. Range 07/24/2019 16:30  Vitamin D, 25-Hydroxy Latest Ref Range: 30.0 - 100.0 ng/mL 57.2   Type 2 diabetes mellitus with obesity ([place]). Fasting blood sugars are in the 150's with 1 low reported (did not eat her lunch). Jessica [name] is taking Humulin 70/30 40 units in the a.m. and 10 units in the p.m.  Lab Results  Component Value Date   HGBA1C 6.7 (H) 07/24/2019   HGBA1C 7.2 (A) 02/20/2019   HGBA1C 6.9 (A) 11/20/2018   Lab Results  Component Value Date   MICROALBUR 0.7 10/25/2017   LDLCALC 74 10/16/2018   CREATININE 1.02 09/04/2019   No results found for: INSULIN  At risk for hypoglycemia. Jessica [name] is at increased risk for hypoglycemia due to diabetes mellitus type II.   Assessment/Plan:   Vitamin D deficiency. Low Vitamin D level contributes to fatigue and are associated with obesity, breast, and colon cancer. She was given a prescription for Vitamin D, Ergocalciferol, (DRISDOL) 1.25 MG (50000 UNIT) CAPS capsule every other week #2 with 0 refills and will follow-up for routine testing of Vitamin D, at least 2-3 times per year to avoid over-replacement.   Type 2 diabetes mellitus with obesity ([place]).  Good blood sugar control is important to decrease the likelihood of diabetic complications such as nephropathy, neuropathy, limb loss, blindness, coronary artery disease, and death. Intensive lifestyle modification including diet, exercise and weight loss are the first line of treatment for diabetes. Jessica [name] will continue her medications as directed. She has glucose tablets if needed.  At risk for hypoglycemia. Jessica [name] was given approximately 15 minutes of counseling today regarding prevention of hypoglycemia. She was advised of symptoms of hypoglycemia. Jessica [name] was instructed to avoid skipping meals, eat regular protein rich meals and schedule low calorie snacks as needed.   Repetitive spaced learning was employed today to elicit superior memory formation and behavioral change.  Class 2 severe obesity with serious comorbidity and body mass index (BMI) of 36.0 to 36.9 in adult, unspecified obesity type ([place]).  Jessica [name] is currently in the action stage of change. As such, her goal is to continue with weight loss efforts. She has agreed to the Category 3 Plan.   She will work on meal planning, intentional eating, and will continue drinking her waster. Suggestions were given for desserts.  Exercise goals: Older adults should follow the adult guidelines. When older adults cannot meet the adult guidelines, they should be as physically active as their abilities and conditions will allow.   Behavioral modification strategies: increasing lean protein intake, decreasing simple carbohydrates, increasing vegetables, increasing water intake, decreasing eating out, no skipping meals, meal planning and cooking strategies, keeping healthy foods in  the home and planning for success.  Jessica [name] has agreed to follow-up with our clinic in 4 weeks. She was informed of the importance of frequent follow-up visits to maximize her success with intensive lifestyle modifications for her multiple health conditions.   Objective:    Blood pressure (!) 141/77, pulse 73, temperature 98.4 F (36.9 C), height 5' 5"  (1.651 m), weight 220 lb (99.8 kg), SpO2 98 %. Body mass index is 36.61 kg/m.  General: Cooperative, alert, well developed, in no acute distress. HEENT: Conjunctivae and lids unremarkable. Cardiovascular: Regular rhythm.  Lungs: Normal work of breathing. Neurologic: No focal deficits.   Lab Results  Component Value Date   CREATININE 1.02 09/04/2019   BUN 18 09/04/2019   NA 138 09/04/2019   K 4.2 09/04/2019   CL 104 09/04/2019   CO2 32 09/04/2019   Lab Results  Component Value Date   ALT 13 07/24/2019   AST 19 07/24/2019   ALKPHOS 92 07/24/2019   BILITOT 0.3 07/24/2019   Lab Results  Component Value Date   HGBA1C 6.7 (H) 07/24/2019   HGBA1C 7.2 (A) 02/20/2019   HGBA1C 6.9 (A) 11/20/2018   HGBA1C 6.9 (A) 07/17/2018   HGBA1C 6.5 (A) 03/13/2018   No results found for: INSULIN Lab Results  Component Value Date   TSH 2.15 10/16/2018   Lab Results  Component Value Date   CHOL 134 10/16/2018   HDL 47.40 10/16/2018   LDLCALC 74 10/16/2018   LDLDIRECT 143.2 11/23/2006   TRIG 64.0 10/16/2018   CHOLHDL 3 10/16/2018   Lab Results  Component Value Date   WBC 4.8 10/28/2018   HGB 13.4 10/28/2018   HCT 40.4 10/28/2018   MCV 97 10/28/2018   PLT 260 10/28/2018   Lab Results  Component Value Date   IRON 74 10/25/2017   TIBC 322 04/04/2012   Attestation Statements:   Reviewed by clinician on day of visit: allergies, medications, problem list, medical history, surgical history, family history, social history, and previous encounter notes.  [name], am acting as Location manager for [employer], DO   I have reviewed the above documentation for accuracy and completeness, and I agree with the above. [name], DO

## 2019-10-28 ENCOUNTER — Other Ambulatory Visit: Payer: Self-pay

## 2019-10-28 ENCOUNTER — Encounter: Payer: Self-pay | Admitting: Endocrinology

## 2019-10-28 ENCOUNTER — Ambulatory Visit (INDEPENDENT_AMBULATORY_CARE_PROVIDER_SITE_OTHER): Payer: Medicare Other | Admitting: Endocrinology

## 2019-10-28 VITALS — BP 134/64 | HR 73 | Ht 65.0 in | Wt 226.4 lb

## 2019-10-28 DIAGNOSIS — Z794 Long term (current) use of insulin: Secondary | ICD-10-CM | POA: Diagnosis not present

## 2019-10-28 DIAGNOSIS — E1143 Type 2 diabetes mellitus with diabetic autonomic (poly)neuropathy: Secondary | ICD-10-CM | POA: Diagnosis not present

## 2019-10-28 DIAGNOSIS — E041 Nontoxic single thyroid nodule: Secondary | ICD-10-CM | POA: Diagnosis not present

## 2019-10-28 LAB — POCT GLYCOSYLATED HEMOGLOBIN (HGB A1C): Hemoglobin A1C: 6.9 % — AB (ref 4.0–5.6)

## 2019-10-28 MED ORDER — HUMULIN 70/30 (70-30) 100 UNIT/ML ~~LOC~~ SUSP: SUBCUTANEOUS | 3 refills | Status: DC

## 2019-10-28 NOTE — Progress Notes (Signed)
Subjective:    Patient ID: Jessica [name], female    DOB: 1951-03-25, 69 y.o.   MRN: [id number]  HPI Pt returns for f/u of diabetes mellitus:  DM type: Insulin-requiring type 2. Dx'ed: 2000.  Complications: PN and AN.   Therapy: insulin since 2005 GDM: never DKA: never Severe hypoglycemia: never.  Pancreatitis: never.  SDOH: she takes human insulin, due to cost; her ability to care for her DM is compromised by her husband's illness.   Other: she has done better on a BID insulin regimen.   Interval history: pt states cbg's vary from 67-240.  There is no trend throughout the day.  pt states she feels well in general.  She seldom has hypoglycemia, and these episodes are mild.  This happens in the afternoon.   Past Medical History:  Diagnosis Date  . Anxiety   . Arthritis    bilateral knees  . Asthma    childhood  . Autonomic neuropathy    diabetic  . Back pain   . Cataract   . Celiac disease   . Constipation   . Depression   . Diabetes mellitus    type II, Hemoglobin A1C 9.9 10/05/2011  . Diabetic neuropathy ([place])   . Diverticulosis   . Fatty liver   . Food allergy   . Gastroparesis   . GERD (gastroesophageal reflux disease)   . Hyperlipidemia   . Hypertension   . IBS (irritable bowel syndrome)   . IBS (irritable bowel syndrome)   . Joint pain   . Kidney stones   . Personal history of colonic polyps 03/2010   hyperplastic  . Pneumonia 11/03/2015   current  . Post-operative nausea and vomiting   . SOB (shortness of breath)     Past Surgical History:  Procedure Laterality Date  . APPENDECTOMY    . CATARACT EXTRACTION Right 04/29/2018  . CATARACT EXTRACTION Left 03/2018  . COLONOSCOPY    . DILATATION & CURRETTAGE/HYSTEROSCOPY WITH RESECTOCOPE N/A 03/24/2013   Procedure: DILATATION & CURETTAGE, HYSTEROSCOPY WITH RESECTION;  Surgeon: Olga Millers, MD;  Location: [facility];  Service: Gynecology;  Laterality: N/A;  . HYSTEROSCOPY WITH D & C N/A 11/08/2015    Procedure: DILATATION AND CURETTAGE /HYSTEROSCOPY;  Surgeon: Olga Millers, MD;  Location: [facility];  Service: Gynecology;  Laterality: N/A;  . left breast cyst removal  2000  . LEFT HEART CATH AND CORONARY ANGIOGRAPHY N/A 10/31/2018   Procedure: LEFT HEART CATH AND CORONARY ANGIOGRAPHY;  Surgeon: Jettie Booze, MD;  Location: [facility];  Service: Cardiovascular;  Laterality: N/A;  . TUBAL LIGATION      Social History   Socioeconomic History  . Marital status: Married    Spouse name: Devar Ault  . Number of children: 2  . Years of education: Not on file  . Highest education level: Not on file  Occupational History  . 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 Smoking status: Never Smoker  . Smokeless tobacco: Never Used  Vaping Use  . Vaping Use: Never used  Substance and Sexual Activity  . Alcohol use: No  . Drug use: No  . Sexual activity: Yes    Birth control/protection: Post-menopausal, Surgical  Other Topics Concern  . Not on file  Social History Narrative  . Not on file   Social Determinants of Health   Financial Resource Strain:   . Difficulty of Paying  Living Expenses:   Food Insecurity:   . Worried About Charity fundraiser in the Last Year:   . Arboriculturist in the Last Year:   Transportation Needs:   . Film/video editor (Medical):   Marland Kitchen Lack of Transportation (Non-Medical):   Physical Activity:   . Days of Exercise per Week:   . Minutes of Exercise per Session:   Stress:   . Feeling of Stress :   Social Connections:   . Frequency of Communication with Friends and Family:   . Frequency of Social Gatherings with Friends and Family:   . Attends Religious Services:   . Active Member of Clubs or Organizations:   . Attends Archivist Meetings:   Marland Kitchen Marital Status:   Intimate Partner Violence:   . Fear of Current or Ex-Partner:   . Emotionally Abused:   Marland Kitchen  Physically Abused:   . Sexually Abused:     Current Outpatient Medications on File Prior to Visit  Medication Sig Dispense Refill  . albuterol (VENTOLIN HFA) 108 (90 Base) MCG/ACT inhaler Inhale 2 puffs into the lungs every 6 (six) hours as needed for wheezing or shortness of breath. 8 g 3  . atorvastatin (LIPITOR) 40 MG tablet TAKE 1 TABLET BY MOUTH EVERY DAY 90 tablet 3  . BD INSULIN SYRINGE U/F 31G X 5/16" 1 ML MISC USE UP TO 3 TIMES A DAY 270 each 3  . dexlansoprazole (DEXILANT) 60 MG capsule Take 1 capsule (60 mg total) by mouth daily. 90 capsule 4  . fluticasone (FLONASE) 50 MCG/ACT nasal spray Place 1 spray into both nostrils daily. 16 g 6  . Fluticasone-Salmeterol (ADVAIR DISKUS) 100-50 MCG/DOSE AEPB Inhale 1 puff into the lungs 2 (two) times daily as needed (for shortness of breath or wheezing). 180 each 3  . furosemide (LASIX) 20 MG tablet Take 1 tablet (20 mg total) by mouth daily as needed for fluid or edema. 90 tablet 3  . gabapentin (NEURONTIN) 600 MG tablet TAKE 1 TABLET IN THE MORNING AND 2 TABLETS AT BEDTIME 270 tablet 2  . ibuprofen (ADVIL) 200 MG tablet Take 200 mg by mouth daily.    Marland Kitchen ketoconazole (NIZORAL) 2 % cream Apply 1 application topically 2 (two) times daily as needed for irritation.     Marland Kitchen olmesartan (BENICAR) 40 MG tablet Take 1 tablet (40 mg total) by mouth daily. 90 tablet 3  . ONETOUCH ULTRA test strip USE 2 TIMES DAILY. AND LANCETS 2/DAY 200 strip 3  . polyethylene glycol powder ([facility] PURELAX) 17 GM/SCOOP powder DISSOLVE 1 CAPFUL IN AT LEAST 8 OUNCES WATER/JUICE AND DRINK TWICE DAILY 1020 g 11  . valACYclovir (VALTREX) 500 MG tablet TAKE 1 TABLET BY MOUTH EVERY DAY 90 tablet 3  . Vitamin D, Ergocalciferol, (DRISDOL) 1.25 MG (50000 UNIT) CAPS capsule One capsule every other week. 2 capsule 0   No current facility-administered medications on file prior to visit.    Allergies  Allergen Reactions  . Gluten Meal Other (See Comments)    No wheat or soy  . Nsaids  Nausea And Vomiting and Other (See Comments)    Cannot tolerate large doses   . Soy Allergy   . Wheat Bran     Family History  Problem Relation Age of Onset  . Hypertension Mother   . Colon cancer Sister        dx in her early 63s  . Diabetes Sister   . Endometrial cancer Sister   .  Hypertension Brother   . Other Father        2 collapsed lungs  . COPD Father   . Diabetes Father   . Heart attack Father   . Heart failure Father   . High blood pressure Father   . Colon polyps Neg Hx   . Esophageal cancer Neg Hx   . Stomach cancer Neg Hx     BP 134/64   Pulse 73   Ht 5' 5"  (1.651 m)   Wt 226 lb 6.4 oz (102.7 kg)   SpO2 96%   BMI 37.67 kg/m    Review of Systems Denies LOC.      Objective:   Physical Exam VITAL SIGNS:  See vs page GENERAL: no distress NECK: There is no palpable thyroid enlargement.  No thyroid nodule is palpable.  No palpable lymphadenopathy at the anterior neck. Pulses: dorsalis pedis intact bilat.   MSK: no deformity of the feet CV: no leg edema Skin:  no ulcer on the feet.  normal color and temp on the feet. Neuro: sensation is intact to touch on the feet.    Lab Results  Component Value Date   HGBA1C 6.9 (A) 10/28/2019       Assessment & Plan:  Insulin-requiring type 2 DM, with PN. Hypoglycemia, due to insulin: this limits aggressiveness of glycemic control  Patient Instructions  Please reduce the insulin to 38 units with breakfast, and 10 units with supper.   check your blood sugar twice a day.  vary the time of day when you check, between before the 3 meals, and at bedtime.  also check if you have symptoms of your blood sugar being too high or too low.  please keep a record of the readings and bring it to your next appointment here (or you can bring the meter itself).  You can write it on any piece of paper.  please call us sooner if your blood sugar goes below 70, or if you have a lot of readings over 200.  Let's recheck the ultrasound.   you will receive a phone call, about a day and time for an appointment. Please come back for a follow-up appointment in 2 months.

## 2019-10-28 NOTE — Patient Instructions (Addendum)
Please reduce the insulin to 38 units with breakfast, and 10 units with supper.   check your blood sugar twice a day.  vary the time of day when you check, between before the 3 meals, and at bedtime.  also check if you have symptoms of your blood sugar being too high or too low.  please keep a record of the readings and bring it to your next appointment here (or you can bring the meter itself).  You can write it on any piece of paper.  please call us sooner if your blood sugar goes below 70, or if you have a lot of readings over 200.  Let's recheck the ultrasound.  you will receive a phone call, about a day and time for an appointment. Please come back for a follow-up appointment in 2 months.

## 2019-10-30 ENCOUNTER — Encounter: Payer: Self-pay | Admitting: Podiatry

## 2019-10-30 ENCOUNTER — Ambulatory Visit (INDEPENDENT_AMBULATORY_CARE_PROVIDER_SITE_OTHER): Payer: 59 | Admitting: Podiatry

## 2019-10-30 ENCOUNTER — Other Ambulatory Visit: Payer: Self-pay

## 2019-10-30 DIAGNOSIS — E1142 Type 2 diabetes mellitus with diabetic polyneuropathy: Secondary | ICD-10-CM

## 2019-10-30 NOTE — Progress Notes (Signed)
She presents today for her diabetic checkup she has not been here in 18 months.  She states that she has been doing very well she states that her hemoglobin A1c is at 6.9 she states that she is concerned about some thickness of skin around the medial border of the hallux nail right a callused area to the distal aspect of the second toe left and may be a little plantar fasciitis of the right foot.  States that it really has been bothering her that much she would just like for me to take a look at it.  Objective: Vital signs are stable she is alert and oriented x3 she has read no reproducible pain on palpation medial calcaneal tubercle pulses are strongly palpable capillary fill time is immediate.  Neurologic sensorium is unchanged.  She has a mild hammertoe deformity with a dystrophic nail second nail left foot.  And she has some slight incurvation of the tibial border of the hallux nail right.  There is mild erythema no cellulitis drainage or odor.  Assessment: Diabetic peripheral neuropathy diabetes with minimal complications.  Nail dystrophy second digit left foot plantar fasciitis in good condition at this point.  She also has a mild ingrown toenail today which she does not want fixed.  Plan: Discussed etiology pathology and surgical therapies this point time she wants no attention paid to these problems I will follow-up with her in 6 months she will call sooner if needed.

## 2019-11-15 ENCOUNTER — Other Ambulatory Visit (INDEPENDENT_AMBULATORY_CARE_PROVIDER_SITE_OTHER): Payer: Self-pay | Admitting: Bariatrics

## 2019-11-15 DIAGNOSIS — E559 Vitamin D deficiency, unspecified: Secondary | ICD-10-CM

## 2019-11-18 ENCOUNTER — Ambulatory Visit
Admission: RE | Admit: 2019-11-18 | Discharge: 2019-11-18 | Disposition: A | Discharge status: Home / Self Care | Payer: Medicare Other | Source: Ambulatory Visit | Attending: Endocrinology | Admitting: Endocrinology

## 2019-11-18 DIAGNOSIS — E041 Nontoxic single thyroid nodule: Secondary | ICD-10-CM

## 2019-11-20 ENCOUNTER — Ambulatory Visit (INDEPENDENT_AMBULATORY_CARE_PROVIDER_SITE_OTHER): Payer: 59 | Admitting: Bariatrics

## 2019-11-27 ENCOUNTER — Ambulatory Visit (INDEPENDENT_AMBULATORY_CARE_PROVIDER_SITE_OTHER): Payer: Medicare Other | Admitting: Bariatrics

## 2019-11-27 ENCOUNTER — Other Ambulatory Visit: Payer: Self-pay

## 2019-11-27 ENCOUNTER — Encounter (INDEPENDENT_AMBULATORY_CARE_PROVIDER_SITE_OTHER): Payer: Self-pay | Admitting: Bariatrics

## 2019-11-27 VITALS — BP 140/75 | HR 68 | Temp 98.4°F | Ht 65.0 in | Wt 224.0 lb

## 2019-11-27 DIAGNOSIS — Z6837 Body mass index (BMI) 37.0-37.9, adult: Secondary | ICD-10-CM | POA: Diagnosis not present

## 2019-11-27 DIAGNOSIS — I10 Essential (primary) hypertension: Secondary | ICD-10-CM | POA: Diagnosis not present

## 2019-11-27 DIAGNOSIS — E7800 Pure hypercholesterolemia, unspecified: Secondary | ICD-10-CM | POA: Diagnosis not present

## 2019-11-27 DIAGNOSIS — E66812 Obesity, class 2: Secondary | ICD-10-CM

## 2019-11-27 NOTE — Progress Notes (Signed)
Chief Complaint:   OBESITY Jessica [name] is here to discuss her progress with her obesity treatment plan along with follow-up of her obesity related diagnoses. Jessica [name] is on the Category 3 Plan and states she is following her eating plan approximately 90% of the time. Jessica [name] states she is walking 3,000 steps daily 3 times per week.  Today's visit was #: 10 Starting weight: 246 lbs Starting date: 03/12/2019 Today's weight: 224 lbs Today's date: 11/27/2019 Total lbs lost to date: 22 Total lbs lost since last in-office visit: 0  Interim History: Jessica [name] is up 4 lbs, but has done well overall. She has been out to dinner. She has had difficulty getting her meals organized.  Subjective:   Essential hypertension. Jessica [name] is taking medications. Blood pressure is controlled.  BP Readings from Last 3 Encounters:  11/27/19 (!) 140/75  10/28/19 134/64  10/23/19 (!) 141/77   Lab Results  Component Value Date   CREATININE 1.02 09/04/2019   CREATININE 1.07 (H) 07/24/2019   CREATININE 0.98 03/12/2019   Hypercholesterolemia. Jessica [name] is taking Lipitor.  Assessment/Plan:   Essential hypertension. Jessica [name] is working on healthy weight loss and exercise to improve blood pressure control. We will watch for signs of hypotension as she continues her lifestyle modifications. She will continue her medication as directed.   Hypercholesterolemia. Jessica [name] will continue Lipitor as directed.   Class 2 severe obesity with serious comorbidity and body mass index (BMI) of 37.0 to 37.9 in adult, unspecified obesity type (Jessica [name]).  Jessica [name] is currently in the action stage of change. As such, her goal is to continue with weight loss efforts. She has agreed to the Category 3 Plan.   She will work on meal planning, intentional eating, decreasing carbohydrates, will eat out less, and will have consistent meals (regular schedule).  Exercise goals: Jessica [name] will continue to walk and will increase over  time.  Behavioral modification strategies: increasing lean protein intake, decreasing simple carbohydrates, increasing vegetables, increasing water intake, decreasing eating out, no skipping meals, meal planning and cooking strategies and keeping healthy foods in the home.  Jessica [name] has agreed to follow-up with our clinic in 4 weeks. She was informed of the importance of frequent follow-up visits to maximize her success with intensive lifestyle modifications for her multiple health conditions.   Objective:   Blood pressure (!) 140/75, pulse 68, temperature 98.4 F (36.9 C), height 5' 5"  (1.651 m), weight (!) 224 lb (101.6 kg), SpO2 98 %. Body mass index is 37.28 kg/m.  General: Cooperative, alert, well developed, in no acute distress. HEENT: Conjunctivae and lids unremarkable. Cardiovascular: Regular rhythm.  Lungs: Normal work of breathing. Neurologic: No focal deficits.   Lab Results  Component Value Date   CREATININE 1.02 09/04/2019   BUN 18 09/04/2019   NA 138 09/04/2019   K 4.2 09/04/2019   CL 104 09/04/2019   CO2 32 09/04/2019   Lab Results  Component Value Date   ALT 13 07/24/2019   AST 19 07/24/2019   ALKPHOS 92 07/24/2019   BILITOT 0.3 07/24/2019   Lab Results  Component Value Date   HGBA1C 6.9 (A) 10/28/2019   HGBA1C 6.7 (H) 07/24/2019   HGBA1C 7.2 (A) 02/20/2019   HGBA1C 6.9 (A) 11/20/2018   HGBA1C 6.9 (A) 07/17/2018   No results found for: INSULIN Lab Results  Component Value Date   TSH 2.15 10/16/2018   Lab Results  Component Value Date   CHOL 134 10/16/2018   HDL 47.40 10/16/2018  LDLCALC 74 10/16/2018   LDLDIRECT 143.2 11/23/2006   TRIG 64.0 10/16/2018   CHOLHDL 3 10/16/2018   Lab Results  Component Value Date   WBC 4.8 10/28/2018   HGB 13.4 10/28/2018   HCT 40.4 10/28/2018   MCV 97 10/28/2018   PLT 260 10/28/2018   Lab Results  Component Value Date   IRON 74 10/25/2017   TIBC 322 04/04/2012   Obesity Behavioral Intervention  Documentation for Insurance:   Approximately 15 minutes were spent on the discussion below.  ASK: We discussed the diagnosis of obesity with Jessica [name] today and Jessica [name] agreed to give [place] permission to discuss obesity behavioral modification therapy today.  ASSESS: Jessica [name] has the diagnosis of obesity and her BMI today is 37.3. Jessica [name] is in the action stage of change.   ADVISE: Jessica [name] was educated on the multiple health risks of obesity as well as the benefit of weight loss to improve her health. She was advised of the need for long term treatment and the importance of lifestyle modifications to improve her current health and to decrease her risk of future health problems.  AGREE: Multiple dietary modification options and treatment options were discussed and Jessica [name] agreed to follow the recommendations documented in the above note.  ARRANGE: Jessica [name] was educated on the importance of frequent visits to treat obesity as outlined per CMS and USPSTF guidelines and agreed to schedule her next follow up appointment today.  Attestation Statements:   Reviewed by clinician on day of visit: allergies, medications, problem list, medical history, surgical history, family history, social history, and previous encounter notes.  [name], am acting as Location manager for [employer], DO   I have reviewed the above documentation for accuracy and completeness, and I agree with the above. [name], DO

## 2019-12-25 ENCOUNTER — Encounter (INDEPENDENT_AMBULATORY_CARE_PROVIDER_SITE_OTHER): Payer: Self-pay | Admitting: Bariatrics

## 2019-12-25 ENCOUNTER — Other Ambulatory Visit: Payer: Self-pay

## 2019-12-25 ENCOUNTER — Ambulatory Visit (INDEPENDENT_AMBULATORY_CARE_PROVIDER_SITE_OTHER): Payer: Medicare Other | Admitting: Bariatrics

## 2019-12-25 VITALS — BP 146/75 | HR 69 | Temp 98.5°F | Ht 65.0 in | Wt 222.0 lb

## 2019-12-25 DIAGNOSIS — E7800 Pure hypercholesterolemia, unspecified: Secondary | ICD-10-CM

## 2019-12-25 DIAGNOSIS — Z6837 Body mass index (BMI) 37.0-37.9, adult: Secondary | ICD-10-CM

## 2019-12-25 DIAGNOSIS — E66812 Obesity, class 2: Secondary | ICD-10-CM

## 2019-12-25 DIAGNOSIS — E559 Vitamin D deficiency, unspecified: Secondary | ICD-10-CM

## 2019-12-25 NOTE — Progress Notes (Signed)
Chief Complaint:   OBESITY Jessica [name] is here to discuss her progress with her obesity treatment plan along with follow-up of her obesity related diagnoses. Jessica [name] is on the Category 3 Plan and states she is following her eating plan approximately 96% of the time. Jessica [name] states she is exercising on the elliptical 60 minutes 7 times per week.  Today's visit was #: 11 Starting weight: 246 lbs Starting date: 03/12/2019 Today's weight: 222 lbs Today's date: 12/25/2019 Total lbs lost to date: 24 Total lbs lost since last in-office visit: 2  Interim History: Jessica [name] is down 2 lbs. She did retire since her last visit. She will be in [place] with her spouse (chemotherapy) and does not know if she will have a kitchen.  Subjective:   Vitamin D deficiency. No nausea, vomiting, or muscle weakness.    Ref. Range 07/24/2019 16:30  Vitamin D, 25-Hydroxy Latest Ref Range: 30.0 - 100.0 ng/mL 57.2   Hypercholesterolemia. Sindee is taking Lipitor.  Assessment/Plan:   Vitamin D deficiency. Low Vitamin D level contributes to fatigue and are associated with obesity, breast, and colon cancer. She agrees to continue to take prescription Vitamin D @50 ,000 IU every week #4 with 0 refills and will follow-up for routine testing of Vitamin D, at least 2-3 times per year to avoid over-replacement.  Hypercholesterolemia. Jessica [name] will continue her medication as directed.   Class 2 severe obesity with serious comorbidity and body mass index (BMI) of 37.0 to 37.9 in adult, unspecified obesity type ([place]).  [name] is currently in the action stage of change. As such, her goal is to continue with weight loss efforts. She has agreed to the Category 3 Plan.   She will work on meal planning and intentional eating.   Handouts were provided on Eating Out and Gluten Free Foods.  Exercise goals: Jessica [name] will continue exercising on the elliptical 60 minutes 7 times per week and using the [place].  Behavioral  modification strategies: increasing lean protein intake, decreasing simple carbohydrates, increasing vegetables, increasing water intake, decreasing eating out, no skipping meals, meal planning and cooking strategies and keeping healthy foods in the home.  Jessica [name] has agreed to follow-up with our clinic in 3 weeks. She was informed of the importance of frequent follow-up visits to maximize her success with intensive lifestyle modifications for her multiple health conditions.   Objective:   Blood pressure (!) 146/75, pulse 69, temperature 98.5 F (36.9 C), height 5' 5"  (1.651 m), weight 222 lb (100.7 kg), SpO2 98 %. Body mass index is 36.94 kg/m.  General: Cooperative, alert, well developed, in no acute distress. HEENT: Conjunctivae and lids unremarkable. Cardiovascular: Regular rhythm.  Lungs: Normal work of breathing. Neurologic: No focal deficits.   Lab Results  Component Value Date   CREATININE 1.02 09/04/2019   BUN 18 09/04/2019   NA 138 09/04/2019   K 4.2 09/04/2019   CL 104 09/04/2019   CO2 32 09/04/2019   Lab Results  Component Value Date   ALT 13 07/24/2019   AST 19 07/24/2019   ALKPHOS 92 07/24/2019   BILITOT 0.3 07/24/2019   Lab Results  Component Value Date   HGBA1C 6.9 (A) 10/28/2019   HGBA1C 6.7 (H) 07/24/2019   HGBA1C 7.2 (A) 02/20/2019   HGBA1C 6.9 (A) 11/20/2018   HGBA1C 6.9 (A) 07/17/2018   No results found for: INSULIN Lab Results  Component Value Date   TSH 2.15 10/16/2018   Lab Results  Component Value Date   CHOL  134 10/16/2018   HDL 47.40 10/16/2018   LDLCALC 74 10/16/2018   LDLDIRECT 143.2 11/23/2006   TRIG 64.0 10/16/2018   CHOLHDL 3 10/16/2018   Lab Results  Component Value Date   WBC 4.8 10/28/2018   HGB 13.4 10/28/2018   HCT 40.4 10/28/2018   MCV 97 10/28/2018   PLT 260 10/28/2018   Lab Results  Component Value Date   IRON 74 10/25/2017   TIBC 322 04/04/2012   Obesity Behavioral Intervention Documentation for Insurance:    Approximately 15 minutes were spent on the discussion below.  ASK: We discussed the diagnosis of obesity with Jessica [name] today and Jessica [name] agreed to give [place] permission to discuss obesity behavioral modification therapy today.  ASSESS: [name] has the diagnosis of obesity and her BMI today is 37.1. [name] is in the action stage of change.   ADVISE: Revella was educated on the multiple health risks of obesity as well as the benefit of weight loss to improve her health. She was advised of the need for long term treatment and the importance of lifestyle modifications to improve her current health and to decrease her risk of future health problems.  AGREE: Multiple dietary modification options and treatment options were discussed and Jessica [name] agreed to follow the recommendations documented in the above note.  ARRANGE: Jessica [name] was educated on the importance of frequent visits to treat obesity as outlined per CMS and USPSTF guidelines and agreed to schedule her next follow up appointment today.  Attestation Statements:   Reviewed by clinician on day of visit: allergies, medications, problem list, medical history, surgical history, family history, social history, and previous encounter notes.  Jessica [name], am acting as Location manager for [employer], DO   I have reviewed the above documentation for accuracy and completeness, and I agree with the above. Jessica Lesch, DO

## 2019-12-29 ENCOUNTER — Encounter (INDEPENDENT_AMBULATORY_CARE_PROVIDER_SITE_OTHER): Payer: Self-pay | Admitting: Bariatrics

## 2019-12-31 ENCOUNTER — Other Ambulatory Visit: Payer: Self-pay

## 2019-12-31 ENCOUNTER — Ambulatory Visit (INDEPENDENT_AMBULATORY_CARE_PROVIDER_SITE_OTHER): Payer: Medicare Other | Admitting: Endocrinology

## 2019-12-31 ENCOUNTER — Encounter: Payer: Self-pay | Admitting: Endocrinology

## 2019-12-31 VITALS — BP 134/70 | HR 63 | Ht 65.0 in | Wt 225.6 lb

## 2019-12-31 DIAGNOSIS — E118 Type 2 diabetes mellitus with unspecified complications: Secondary | ICD-10-CM

## 2019-12-31 DIAGNOSIS — Z794 Long term (current) use of insulin: Secondary | ICD-10-CM

## 2019-12-31 DIAGNOSIS — E1143 Type 2 diabetes mellitus with diabetic autonomic (poly)neuropathy: Secondary | ICD-10-CM

## 2019-12-31 LAB — POCT GLYCOSYLATED HEMOGLOBIN (HGB A1C): Hemoglobin A1C: 7.1 % — AB (ref 4.0–5.6)

## 2019-12-31 NOTE — Progress Notes (Signed)
Subjective:    Patient ID: Jessica [name], female    DOB: January 07, 1951, 69 y.o.   MRN: [id number]  HPI Pt returns for f/u of diabetes mellitus:  DM type: Insulin-requiring type 2. Dx'ed: 2000.  Complications: PN and AN.   Therapy: insulin since 2005 GDM: never DKA: never Severe hypoglycemia: never.  Pancreatitis: never.  SDOH: she takes human insulin, due to cost; her ability to care for her DM is compromised by her husband's illness.   Other: she has done better on a BID insulin regimen.   Interval history: pt states cbg's vary from 66-260.  There is no trend throughout the day.  pt states she feels well in general.  She seldom has hypoglycemia, and these episodes are mild.  This happens after she misses lunch.  Pt also has MNG ([place] in 2020 showed Right inferior thyroid nodule needs surveillance) Past Medical History:  Diagnosis Date  . Anxiety   . Arthritis    bilateral knees  . Asthma    childhood  . Autonomic neuropathy    diabetic  . Back pain   . Cataract   . Celiac disease   . Constipation   . Depression   . Diabetes mellitus    type II, Hemoglobin A1C 9.9 10/05/2011  . Diabetic neuropathy ([place])   . Diverticulosis   . Fatty liver   . Food allergy   . Gastroparesis   . GERD (gastroesophageal reflux disease)   . Hyperlipidemia   . Hypertension   . IBS (irritable bowel syndrome)   . IBS (irritable bowel syndrome)   . Joint pain   . Kidney stones   . Personal history of colonic polyps 03/2010   hyperplastic  . Pneumonia 11/03/2015   current  . Post-operative nausea and vomiting   . SOB (shortness of breath)     Past Surgical History:  Procedure Laterality Date  . APPENDECTOMY    . CATARACT EXTRACTION Right 04/29/2018  . CATARACT EXTRACTION Left 03/2018  . COLONOSCOPY    . DILATATION & CURRETTAGE/HYSTEROSCOPY WITH RESECTOCOPE N/A 03/24/2013   Procedure: DILATATION & CURETTAGE, HYSTEROSCOPY WITH RESECTION;  Surgeon: Olga Millers, MD;  Location: [facility];   Service: Gynecology;  Laterality: N/A;  . HYSTEROSCOPY WITH D & C N/A 11/08/2015   Procedure: DILATATION AND CURETTAGE /HYSTEROSCOPY;  Surgeon: Olga Millers, MD;  Location: [facility];  Service: Gynecology;  Laterality: N/A;  . left breast cyst removal  2000  . LEFT HEART CATH AND CORONARY ANGIOGRAPHY N/A 10/31/2018   Procedure: LEFT HEART CATH AND CORONARY ANGIOGRAPHY;  Surgeon: Jettie Booze, MD;  Location: [facility];  Service: Cardiovascular;  Laterality: N/A;  . TUBAL LIGATION      Social History   Socioeconomic History  . Marital status: Married    Spouse name: Devar Andalon  . Number of children: 2  . Years of education: Not on file  . Highest education level: Not on file  Occupational History  . 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 Smoking status: Never Smoker  . Smokeless tobacco: Never Used  Vaping Use  . Vaping Use: Never used  Substance and Sexual Activity  . Alcohol use: No  . Drug use: No  . Sexual activity: Yes    Birth control/protection: Post-menopausal, Surgical  Other Topics Concern  . Not on file  Social History Narrative  . Not on file   Social  Determinants of Health   Financial Resource Strain:   . Difficulty of Paying Living Expenses: Not on file  Food Insecurity:   . Worried About Charity fundraiser in the Last Year: Not on file  . Ran Out of Food in the Last Year: Not on file  Transportation Needs:   . Lack of Transportation (Medical): Not on file  . Lack of Transportation (Non-Medical): Not on file  Physical Activity:   . Days of Exercise per Week: Not on file  . Minutes of Exercise per Session: Not on file  Stress:   . Feeling of Stress : Not on file  Social Connections:   . Frequency of Communication with Friends and Family: Not on file  . Frequency of Social Gatherings with Friends and Family: Not on file  . Attends Religious Services: Not on file  . Active  Member of Clubs or Organizations: Not on file  . Attends Archivist Meetings: Not on file  . Marital Status: Not on file  Intimate Partner Violence:   . Fear of Current or Ex-Partner: Not on file  . Emotionally Abused: Not on file  . Physically Abused: Not on file  . Sexually Abused: Not on file    Current Outpatient Medications on File Prior to Visit  Medication Sig Dispense Refill  . albuterol (VENTOLIN HFA) 108 (90 Base) MCG/ACT inhaler Inhale 2 puffs into the lungs every 6 (six) hours as needed for wheezing or shortness of breath. 8 g 3  . atorvastatin (LIPITOR) 40 MG tablet TAKE 1 TABLET BY MOUTH EVERY DAY 90 tablet 3  . BD INSULIN SYRINGE U/F 31G X 5/16" 1 ML MISC USE UP TO 3 TIMES A DAY 270 each 3  . dexlansoprazole (DEXILANT) 60 MG capsule Take 1 capsule (60 mg total) by mouth daily. 90 capsule 4  . fluticasone (FLONASE) 50 MCG/ACT nasal spray Place 1 spray into both nostrils daily. 16 g 6  . furosemide (LASIX) 20 MG tablet Take 1 tablet (20 mg total) by mouth daily as needed for fluid or edema. 90 tablet 3  . gabapentin (NEURONTIN) 600 MG tablet TAKE 1 TABLET IN THE MORNING AND 2 TABLETS AT BEDTIME 270 tablet 2  . ibuprofen (ADVIL) 200 MG tablet Take 200 mg by mouth daily.    . insulin NPH-regular Human (HUMULIN 70/30) (70-30) 100 UNIT/ML injection 38 UNITS WITH BREAKFAST, AND 10 UNITS WITH SUPPER. 60 mL 3  . ketoconazole (NIZORAL) 2 % cream Apply 1 application topically 2 (two) times daily as needed for irritation.     Marland Kitchen olmesartan (BENICAR) 40 MG tablet Take 1 tablet (40 mg total) by mouth daily. 90 tablet 3  . ONETOUCH ULTRA test strip USE 2 TIMES DAILY. AND LANCETS 2/DAY 200 strip 3  . polyethylene glycol powder ([facility] PURELAX) 17 GM/SCOOP powder DISSOLVE 1 CAPFUL IN AT LEAST 8 OUNCES WATER/JUICE AND DRINK TWICE DAILY 1020 g 11  . valACYclovir (VALTREX) 500 MG tablet TAKE 1 TABLET BY MOUTH EVERY DAY 90 tablet 3  . Vitamin D, Ergocalciferol, (DRISDOL) 1.25 MG (50000  UNIT) CAPS capsule One capsule every other week. 2 capsule 0   No current facility-administered medications on file prior to visit.    Allergies  Allergen Reactions  . Gluten Meal Other (See Comments)    No wheat or soy  . Nsaids Nausea And Vomiting and Other (See Comments)    Cannot tolerate large doses   . Soy Allergy   . Wheat Bran  Family History  Problem Relation Age of Onset  . Hypertension Mother   . Colon cancer Sister        dx in her early 57s  . Diabetes Sister   . Endometrial cancer Sister   . Hypertension Brother   . Other Father        2 collapsed lungs  . COPD Father   . Diabetes Father   . Heart attack Father   . Heart failure Father   . High blood pressure Father   . Colon polyps Neg Hx   . Esophageal cancer Neg Hx   . Stomach cancer Neg Hx     BP 134/70   Pulse 63   Ht 5' 5"  (1.651 m)   Wt 225 lb 9.6 oz (102.3 kg)   SpO2 97%   BMI 37.54 kg/m   Review of Systems Denies LOC    Objective:   Physical Exam VITAL SIGNS:  See vs page GENERAL: no distress Pulses: dorsalis pedis intact bilat.   MSK: no deformity of the feet CV: no leg edema Skin:  no ulcer on the feet.  normal color and temp on the feet. Neuro: sensation is intact to touch on the feet   Lab Results  Component Value Date   HGBA1C 7.1 (A) 12/31/2019   Lab Results  Component Value Date   CREATININE 1.02 09/04/2019   BUN 18 09/04/2019   NA 138 09/04/2019   K 4.2 09/04/2019   CL 104 09/04/2019   CO2 32 09/04/2019       Assessment & Plan:  Insulin-requiring type 2 DM, with AN Hypoglycemia, due to insulin: this limits aggressiveness of glycemic control MNG: due for recheck  Patient Instructions  Please continue the same insulin.   check your blood sugar twice a day.  vary the time of day when you check, between before the 3 meals, and at bedtime.  also check if you have symptoms of your blood sugar being too high or too low.  please keep a record of the readings and  bring it to your next appointment here (or you can bring the meter itself).  You can write it on any piece of paper.  please call us sooner if your blood sugar goes below 70, or if you have a lot of readings over 200.  Let's recheck the ultrasound.  you will receive a phone call, about a day and time for an appointment. Please come back for a follow-up appointment in January.

## 2019-12-31 NOTE — Patient Instructions (Addendum)
Please continue the same insulin.   check your blood sugar twice a day.  vary the time of day when you check, between before the 3 meals, and at bedtime.  also check if you have symptoms of your blood sugar being too high or too low.  please keep a record of the readings and bring it to your next appointment here (or you can bring the meter itself).  You can write it on any piece of paper.  please call us sooner if your blood sugar goes below 70, or if you have a lot of readings over 200.  Let's recheck the ultrasound.  you will receive a phone call, about a day and time for an appointment. Please come back for a follow-up appointment in January.

## 2020-01-08 MED ORDER — VITAMIN D (ERGOCALCIFEROL) 1.25 MG (50000 UNIT) PO CAPS: 50000.0000 [IU] | ORAL_CAPSULE | ORAL | 0 refills | Status: DC

## 2020-01-08 NOTE — Addendum Note (Signed)
Addended by: Karren Cobble on: 01/08/2020 03:44 PM   Modules accepted: Orders

## 2020-01-15 ENCOUNTER — Encounter (INDEPENDENT_AMBULATORY_CARE_PROVIDER_SITE_OTHER): Payer: Self-pay | Admitting: Bariatrics

## 2020-01-15 ENCOUNTER — Other Ambulatory Visit: Payer: Self-pay

## 2020-01-15 ENCOUNTER — Ambulatory Visit (INDEPENDENT_AMBULATORY_CARE_PROVIDER_SITE_OTHER): Payer: Medicare Other | Admitting: Bariatrics

## 2020-01-15 VITALS — BP 126/71 | HR 65 | Temp 98.0°F | Ht 65.0 in | Wt 221.0 lb

## 2020-01-15 DIAGNOSIS — Z794 Long term (current) use of insulin: Secondary | ICD-10-CM

## 2020-01-15 DIAGNOSIS — F3289 Other specified depressive episodes: Secondary | ICD-10-CM | POA: Diagnosis not present

## 2020-01-15 DIAGNOSIS — E1143 Type 2 diabetes mellitus with diabetic autonomic (poly)neuropathy: Secondary | ICD-10-CM

## 2020-01-15 DIAGNOSIS — E7849 Other hyperlipidemia: Secondary | ICD-10-CM

## 2020-01-15 MED ORDER — BUPROPION HCL ER (SR) 150 MG PO TB12: 150.0000 mg | ORAL_TABLET | Freq: Every day | ORAL | 0 refills | Status: DC

## 2020-01-20 ENCOUNTER — Ambulatory Visit (INDEPENDENT_AMBULATORY_CARE_PROVIDER_SITE_OTHER): Payer: Medicare Other | Admitting: Family Medicine

## 2020-01-20 ENCOUNTER — Encounter: Payer: Self-pay | Admitting: Family Medicine

## 2020-01-20 ENCOUNTER — Other Ambulatory Visit: Payer: Self-pay

## 2020-01-20 ENCOUNTER — Encounter (INDEPENDENT_AMBULATORY_CARE_PROVIDER_SITE_OTHER): Payer: Self-pay | Admitting: Bariatrics

## 2020-01-20 VITALS — BP 132/80 | HR 66 | Temp 97.3°F | Ht 65.0 in | Wt 225.2 lb

## 2020-01-20 DIAGNOSIS — Z23 Encounter for immunization: Secondary | ICD-10-CM | POA: Diagnosis not present

## 2020-01-20 DIAGNOSIS — I10 Essential (primary) hypertension: Secondary | ICD-10-CM | POA: Diagnosis not present

## 2020-01-20 DIAGNOSIS — R600 Localized edema: Secondary | ICD-10-CM | POA: Diagnosis not present

## 2020-01-20 DIAGNOSIS — S2001XA Contusion of right breast, initial encounter: Secondary | ICD-10-CM | POA: Diagnosis not present
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
## 2020-01-20 NOTE — Progress Notes (Signed)
Chief Complaint:   OBESITY Jessica [name] is here to discuss her progress with her obesity treatment plan along with follow-up of her obesity related diagnoses. Jessica [name] is on the Category 3 Plan and states she is following her eating plan approximately 95% of the time. Jessica [name] states she is exercising on the elliptical 60 minutes 7 times per week.  Today's visit was #: 12 Starting weight: 246 lbs Starting date: 03/12/2019 Today's weight: 221 lbs Today's date: 01/15/2020 Total lbs lost to date: 25 Total lbs lost since last in-office visit: 1  Interim History: Jessica [name] is down an additional 1 lb since her last visit. She is working on not stress eating.  Subjective:   Type 2 diabetes mellitus with diabetic autonomic neuropathy, with long-term current use of insulin ([place]). Jessica [name] is taking Humulin 70/30.   Lab Results  Component Value Date   HGBA1C 7.1 (A) 12/31/2019   HGBA1C 6.9 (A) 10/28/2019   HGBA1C 6.7 (H) 07/24/2019   Lab Results  Component Value Date   MICROALBUR 0.7 10/25/2017   LDLCALC 74 10/16/2018   CREATININE 1.02 09/04/2019   No results found for: INSULIN  Other hyperlipidemia. Jessica [name] is taking Lipitor.   Lab Results  Component Value Date   CHOL 134 10/16/2018   HDL 47.40 10/16/2018   LDLCALC 74 10/16/2018   LDLDIRECT 143.2 11/23/2006   TRIG 64.0 10/16/2018   CHOLHDL 3 10/16/2018   Lab Results  Component Value Date   ALT 13 07/24/2019   AST 19 07/24/2019   ALKPHOS 92 07/24/2019   BILITOT 0.3 07/24/2019   The 10-year ASCVD risk score Jessica Bussing DC Jr., Jessica al., Jessica [name]) is: 19%   Values used to calculate the score:     Age: 69 years     Sex: Female     Is Non-Hispanic African American: Yes     Diabetic: Yes     Tobacco smoker: No     Systolic Blood Pressure: 026 mmHg     Is BP treated: Yes     HDL Cholesterol: 47.4 mg/dL     Total Cholesterol: 134 mg/dL  Other depression, with emotional eating. Synai is struggling with emotional eating and using  food for comfort to the extent that it is negatively impacting her health. She has been working on behavior modification techniques to help reduce her emotional eating and has been somewhat successful. She shows no sign of suicidal or homicidal ideations. Jessica [name] reports increased emotional eating.  Assessment/Plan:   Type 2 diabetes mellitus with diabetic autonomic neuropathy, with long-term current use of insulin (Jessica [name]). Good blood sugar control is important to decrease the likelihood of diabetic complications such as nephropathy, neuropathy, limb loss, blindness, coronary artery disease, and death. Intensive lifestyle modification including diet, exercise and weight loss are the first line of treatment for diabetes. Jessica [name] will continue her medication as directed.   Other hyperlipidemia. Cardiovascular risk and specific lipid/LDL goals reviewed.  We discussed several lifestyle modifications today and Jessica [name] will continue to work on diet, exercise and weight loss efforts. Orders and follow up as documented in patient record. She will continue Lipitor as directed.   Counseling Intensive lifestyle modifications are the first line treatment for this issue. . Dietary changes: Increase soluble fiber. Decrease simple carbohydrates. . Exercise changes: Moderate to vigorous-intensity aerobic activity 150 minutes per week if tolerated. . Lipid-lowering medications: see documented in medical record.  Other depression, with emotional eating. Behavior modification techniques were discussed today to help Jessica [name] deal with  her emotional/non-hunger eating behaviors.  Orders and follow up as documented in patient record. Prescription was given for buPROPion (WELLBUTRIN SR) 150 MG 12 hr tablet 1 PO daily #30 with 0 refills.  Class 2 severe obesity with serious comorbidity and body mass index (BMI) of 36.0 to 36.9 in adult, unspecified obesity type (Jessica [name]).  Jessica [name] is currently in the action stage of change. As such,  her goal is to continue with weight loss efforts. She has agreed to the Category 3 Plan.   She will work on meal planning and intentional eating.   Exercise goals: Older adults should follow the adult guidelines. When older adults cannot meet the adult guidelines, they should be as physically active as their abilities and conditions will allow.   Behavioral modification strategies: increasing lean protein intake, decreasing simple carbohydrates, increasing vegetables, increasing water intake, decreasing eating out, no skipping meals, meal planning and cooking strategies, keeping healthy foods in the home, better snacking choices and planning for success.  Jessica [name] has agreed to follow-up with our clinic fasting in 4 weeks. She was informed of the importance of frequent follow-up visits to maximize her success with intensive lifestyle modifications for her multiple health conditions.   Objective:   Blood pressure 126/71, pulse 65, temperature 98 F (36.7 C), height 5' 5"  (1.651 m), weight 221 lb (100.2 kg), SpO2 99 %. Body mass index is 36.78 kg/m.  General: Cooperative, alert, well developed, in no acute distress. HEENT: Conjunctivae and lids unremarkable. Cardiovascular: Regular rhythm.  Lungs: Normal work of breathing. Neurologic: No focal deficits.   Lab Results  Component Value Date   CREATININE 1.02 09/04/2019   BUN 18 09/04/2019   NA 138 09/04/2019   K 4.2 09/04/2019   CL 104 09/04/2019   CO2 32 09/04/2019   Lab Results  Component Value Date   ALT 13 07/24/2019   AST 19 07/24/2019   ALKPHOS 92 07/24/2019   BILITOT 0.3 07/24/2019   Lab Results  Component Value Date   HGBA1C 7.1 (A) 12/31/2019   HGBA1C 6.9 (A) 10/28/2019   HGBA1C 6.7 (H) 07/24/2019   HGBA1C 7.2 (A) 02/20/2019   HGBA1C 6.9 (A) 11/20/2018   No results found for: INSULIN Lab Results  Component Value Date   TSH 2.15 10/16/2018   Lab Results  Component Value Date   CHOL 134 10/16/2018   HDL 47.40  10/16/2018   LDLCALC 74 10/16/2018   LDLDIRECT 143.2 11/23/2006   TRIG 64.0 10/16/2018   CHOLHDL 3 10/16/2018   Lab Results  Component Value Date   WBC 4.8 10/28/2018   HGB 13.4 10/28/2018   HCT 40.4 10/28/2018   MCV 97 10/28/2018   PLT 260 10/28/2018   Lab Results  Component Value Date   IRON 74 10/25/2017   TIBC 322 12/05/Jessica [name]   Obesity Behavioral Intervention:   Approximately 15 minutes were spent on the discussion below.  ASK: We discussed the diagnosis of obesity with Jessica [name] today and Jessica [name] agreed to give [place] permission to discuss obesity behavioral modification therapy today.  ASSESS: Jessica [name] has the diagnosis of obesity and her BMI today is 36.8. Jessica [name] is in the action stage of change.   ADVISE: Jessica [name] was educated on the multiple health risks of obesity as well as the benefit of weight loss to improve her health. She was advised of the need for long term treatment and the importance of lifestyle modifications to improve her current health and to decrease her risk of future health problems.  AGREE:  Multiple dietary modification options and treatment options were discussed and Jessica [name] agreed to follow the recommendations documented in the above note.  ARRANGE: Bonniejean was educated on the importance of frequent visits to treat obesity as outlined per CMS and USPSTF guidelines and agreed to schedule her next follow up appointment today.  Attestation Statements:   Reviewed by clinician on day of visit: allergies, medications, problem list, medical history, surgical history, family history, social history, and previous encounter notes.  [name], am acting as Location manager for [employer], DO   I have reviewed the above documentation for accuracy and completeness, and I agree with the above. [name], DO

## 2020-01-20 NOTE — Patient Instructions (Signed)
Please follow up as scheduled for your next visit with me: 02/16/2020 for your physical with lab work  Today you were given your flu vaccination.  I'm sorry that happened to you but feel confident you will be fine. Take care!  If you have any questions or concerns, please don't hesitate to send me a message via MyChart or call the office at [phone]. Thank you for visiting with [place] today! It's our pleasure caring for you.

## 2020-02-02 ENCOUNTER — Other Ambulatory Visit (INDEPENDENT_AMBULATORY_CARE_PROVIDER_SITE_OTHER): Payer: Self-pay | Admitting: Bariatrics

## 2020-02-02 DIAGNOSIS — E559 Vitamin D deficiency, unspecified: Secondary | ICD-10-CM

## 2020-02-03 ENCOUNTER — Other Ambulatory Visit (INDEPENDENT_AMBULATORY_CARE_PROVIDER_SITE_OTHER): Payer: Self-pay

## 2020-02-03 DIAGNOSIS — E559 Vitamin D deficiency, unspecified: Secondary | ICD-10-CM

## 2020-02-03 MED ORDER — VITAMIN D (ERGOCALCIFEROL) 1.25 MG (50000 UNIT) PO CAPS: 50000.0000 [IU] | ORAL_CAPSULE | ORAL | 0 refills | Status: DC

## 2020-02-06 ENCOUNTER — Other Ambulatory Visit (INDEPENDENT_AMBULATORY_CARE_PROVIDER_SITE_OTHER): Payer: Self-pay | Admitting: Bariatrics

## 2020-02-06 DIAGNOSIS — F3289 Other specified depressive episodes: Secondary | ICD-10-CM

## 2020-02-12 ENCOUNTER — Ambulatory Visit (INDEPENDENT_AMBULATORY_CARE_PROVIDER_SITE_OTHER): Payer: 59 | Admitting: Bariatrics

## 2020-02-16 ENCOUNTER — Ambulatory Visit: Admitting: Family Medicine

## 2020-02-19 ENCOUNTER — Ambulatory Visit: Payer: Medicare Other

## 2020-02-19 ENCOUNTER — Ambulatory Visit (INDEPENDENT_AMBULATORY_CARE_PROVIDER_SITE_OTHER): Payer: Medicare Other

## 2020-02-19 DIAGNOSIS — Z0000 Encounter for general adult medical examination without abnormal findings: Secondary | ICD-10-CM

## 2020-02-19 NOTE — Progress Notes (Signed)
Virtual Visit via Telephone Note  I connected with  Jessica [name] on 02/19/20 at  2:30 PM EDT by telephone and verified that I am speaking with the correct person using two identifiers.  Medicare Annual Wellness visit completed telephonically due to Covid-19 pandemic.   Persons participating in this call: This Health Coach and this patient.   Location: Patient: Home Provider: Office   I discussed the limitations, risks, security and privacy concerns of performing an evaluation and management service by telephone and the availability of in person appointments. The patient expressed understanding and agreed to proceed.  Unable to perform video visit due to video visit attempted and failed and/or patient does not have video capability.   Some vital signs may be absent or patient reported.   [name], LPN    Subjective:   Jessica [name] is a 69 y.o. female who presents for Medicare Annual (Subsequent) preventive examination.  Review of Systems     Cardiac Risk Factors include: advanced age (>54mn, >>47women);diabetes mellitus;hypertension;obesity (BMI >30kg/m2)     Objective:    There were no vitals filed for this visit. There is no height or weight on file to calculate BMI.  Advanced Directives 02/19/2020 01/16/2019 10/31/2018 10/25/2017 01/07/2016 11/10/2015 11/04/2015  Does Patient Have a Medical Advance Directive? No No No No No No No  Would patient like information on creating a medical advance directive? - Yes (MAU/Ambulatory/Procedural Areas - Information given) No - Patient declined - No - patient declined information Yes - Educational materials given Yes - Educational materials given    Current Medications (verified) Outpatient Encounter Medications as of 02/19/2020  Medication Sig  . albuterol (VENTOLIN HFA) 108 (90 Base) MCG/ACT inhaler Inhale 2 puffs into the lungs every 6 (six) hours as needed for wheezing or shortness of breath.  .Marland Kitchenatorvastatin (LIPITOR) 40 MG  tablet TAKE 1 TABLET BY MOUTH EVERY DAY  . BD INSULIN SYRINGE U/F 31G X 5/16" 1 ML MISC USE UP TO 3 TIMES A DAY  . dexlansoprazole (DEXILANT) 60 MG capsule Take 1 capsule (60 mg total) by mouth daily.  . fluticasone (FLONASE) 50 MCG/ACT nasal spray Place 1 spray into both nostrils daily.  . furosemide (LASIX) 20 MG tablet Take 1 tablet (20 mg total) by mouth daily as needed for fluid or edema.  . gabapentin (NEURONTIN) 600 MG tablet TAKE 1 TABLET IN THE MORNING AND 2 TABLETS AT BEDTIME  . ibuprofen (ADVIL) 200 MG tablet Take 200 mg by mouth daily.  . insulin NPH-regular Human (HUMULIN 70/30) (70-30) 100 UNIT/ML injection 38 UNITS WITH BREAKFAST, AND 10 UNITS WITH SUPPER.  .Marland Kitchenolmesartan (BENICAR) 40 MG tablet Take 1 tablet (40 mg total) by mouth daily.  .Glory RosebushULTRA test strip USE 2 TIMES DAILY. AND LANCETS 2/DAY  . polyethylene glycol powder ([employer] PURELAX) 17 GM/SCOOP powder DISSOLVE 1 CAPFUL IN AT LEAST 8 OUNCES WATER/JUICE AND DRINK TWICE DAILY  . valACYclovir (VALTREX) 500 MG tablet TAKE 1 TABLET BY MOUTH EVERY DAY  . Vitamin D, Ergocalciferol, (DRISDOL) 1.25 MG (50000 UNIT) CAPS capsule Take 1 capsule (50,000 Units total) by mouth every 7 (seven) days.  .Marland KitchenbuPROPion (WELLBUTRIN SR) 150 MG 12 hr tablet Take 1 tablet (150 mg total) by mouth daily. (Patient not taking: Reported on 02/19/2020)  . ketoconazole (NIZORAL) 2 % cream Apply 1 application topically 2 (two) times daily as needed for irritation.  (Patient not taking: Reported on 02/19/2020)   No facility-administered encounter medications on file  as of 02/19/2020.    Allergies (verified) Gluten meal, Nsaids, Soy allergy, and Wheat bran   History: Past Medical History:  Diagnosis Date  . Anxiety   . Arthritis    bilateral knees  . Asthma    childhood  . Autonomic neuropathy    diabetic  . Back pain   . Cataract   . Celiac disease   . Constipation   . Depression   . Diabetes mellitus    type II, Hemoglobin A1C 9.9  10/05/2011  . Diabetic neuropathy ([place])   . Diverticulosis   . Fatty liver   . Food allergy   . Gastroparesis   . GERD (gastroesophageal reflux disease)   . Hyperlipidemia   . Hypertension   . IBS (irritable bowel syndrome)   . IBS (irritable bowel syndrome)   . Joint pain   . Kidney stones   . Personal history of colonic polyps 03/2010   hyperplastic  . Pneumonia 11/03/2015   current  . Post-operative nausea and vomiting   . SOB (shortness of breath)    Past Surgical History:  Procedure Laterality Date  . APPENDECTOMY    . CATARACT EXTRACTION Right 04/29/2018  . CATARACT EXTRACTION Left 03/2018  . COLONOSCOPY    . DILATATION & CURRETTAGE/HYSTEROSCOPY WITH RESECTOCOPE N/A 03/24/2013   Procedure: DILATATION & CURETTAGE, HYSTEROSCOPY WITH RESECTION;  Surgeon: Olga Millers, MD;  Location: [facility];  Service: Gynecology;  Laterality: N/A;  . HYSTEROSCOPY WITH D & C N/A 11/08/2015   Procedure: DILATATION AND CURETTAGE /HYSTEROSCOPY;  Surgeon: Olga Millers, MD;  Location: [facility];  Service: Gynecology;  Laterality: N/A;  . left breast cyst removal  2000  . LEFT HEART CATH AND CORONARY ANGIOGRAPHY N/A 10/31/2018   Procedure: LEFT HEART CATH AND CORONARY ANGIOGRAPHY;  Surgeon: Jettie Booze, MD;  Location: [facility];  Service: Cardiovascular;  Laterality: N/A;  . TUBAL LIGATION     Family History  Problem Relation Age of Onset  . Hypertension Mother   . Colon cancer Sister        dx in her early 89s  . Diabetes Sister   . Endometrial cancer Sister   . Hypertension Brother   . Other Father        2 collapsed lungs  . COPD Father   . Diabetes Father   . Heart attack Father   . Heart failure Father   . High blood pressure Father   . Colon polyps Neg Hx   . Esophageal cancer Neg Hx   . Stomach cancer Neg Hx    Social History   Socioeconomic History  . Marital status: Married    Spouse name: Devar Meske  . Number of children: 2  . Years of education: Not  on file  . Highest education level: Not on file  Occupational History  . 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 Occupation: retired  Tobacco Use  . Smoking status: Never Smoker  . Smokeless tobacco: Never Used  Vaping Use  . Vaping Use: Never used  Substance and Sexual Activity  . Alcohol use: No  . Drug use: No  . Sexual activity: Yes    Birth control/protection: Post-menopausal, Surgical  Other Topics Concern  . Not on file  Social History Narrative  . Not on file   Social Determinants of Health   Financial Resource Strain: Low Risk   . Difficulty of Paying Living Expenses:  Not hard at all  Food Insecurity: No Food Insecurity  . Worried About Charity fundraiser in the Last Year: Never true  . Ran Out of Food in the Last Year: Never true  Transportation Needs: No Transportation Needs  . Lack of Transportation (Medical): No  . Lack of Transportation (Non-Medical): No  Physical Activity: Insufficiently Active  . Days of Exercise per Week: 2 days  . Minutes of Exercise per Session: 60 min  Stress: Stress Concern Present  . Feeling of Stress : To some extent  Social Connections: Moderately Integrated  . Frequency of Communication with Friends and Family: More than three times a week  . Frequency of Social Gatherings with Friends and Family: Never  . Attends Religious Services: More than 4 times per year  . Active Member of Clubs or Organizations: No  . Attends Archivist Meetings: Never  . Marital Status: Married    Tobacco Counseling Counseling given: Not Answered   Clinical Intake:  Pre-visit preparation completed: Yes  Pain : 0-10 (knees) Pain Type: Chronic pain Pain Location: Knee Pain Orientation: Right (right worst than left) Pain Descriptors / Indicators: Aching Pain Onset: More than a month ago Pain Frequency: Intermittent     BMI - recorded: 37.48 Nutritional Status: BMI > 30   Obese Nutritional Risks: Nausea/ vomitting/ diarrhea (nausea at times) Diabetes: Yes CBG done?: No  How often do you need to have someone help you when you read instructions, pamphlets, or other written materials from your doctor or pharmacy?: 1 - Never  Diabetic?Yes  Interpreter Needed?: No  Information entered by :: Charlott Rakes, LPN   Activities of Daily Living In your present state of health, do you have any difficulty performing the following activities: 02/19/2020 08/13/2019  Hearing? N N  Vision? N N  Difficulty concentrating or making decisions? N N  Walking or climbing stairs? Y N  Comment related to knees -  Dressing or bathing? N N  Doing errands, shopping? N N  Preparing Food and eating ? N -  Using the Toilet? N -  In the past six months, have you accidently leaked urine? N -  Do you have problems with loss of bowel control? N -  Managing your Medications? N -  Managing your Finances? N -  Housekeeping or managing your Housekeeping? N -  Some recent data might be hidden    Patient Care Team: [name], MD as PCP - General (Family Medicine) Jettie Booze, MD as PCP - Cardiology (Cardiology) Olga Millers, MD as Attending Physician (Obstetrics and Gynecology) Renato Shin, MD as Consulting Physician (Endocrinology) Pyrtle, Lajuan Lines, MD as Consulting Physician (Gastroenterology) Stephannie Li, [place] as Consulting Physician (Ophthalmology) Garrel Ridgel, DPM as Consulting Physician (Podiatry)  Indicate any recent Medical Services you may have received from other than [facility] providers in the past year (date may be approximate).     Assessment:   This is a routine wellness examination for Jessica [name].  Hearing/Vision screen  Hearing Screening   125Hz  250Hz  500Hz  1000Hz  2000Hz  3000Hz  4000Hz  6000Hz  8000Hz   Right ear:           Left ear:           Comments: Pt denies any hearing at this time   Vision Screening Comments: Pt follows up with Dr Sharren Bridge  for eye exams annaully  Dietary issues and exercise activities discussed: Current Exercise Habits: Home exercise routine, Type of exercise: Other - see comments (uses  the QB), Time (Minutes): 60, Frequency (Times/Week): 2, Weekly Exercise (Minutes/Week): 120  Goals    . Increase physical activity     Would like to increase activity to increase energy level     . Patient Stated     Lose weight      Depression Screen PHQ 2/9 Scores 02/19/2020 03/12/2019 01/16/2019 02/14/2018  PHQ - 2 Score 1 3 0 2  PHQ- 9 Score - 10 - 5  Exception Documentation - Medical reason - -    Fall Risk Fall Risk  02/19/2020 08/13/2019 01/16/2019 01/16/2019 02/14/2018  Falls in the past year? 1 0 0 0 No  Number falls in past yr: 1 0 0 0 -  Injury with Fall? 1 0 0 0 -  Risk for fall due to : Impaired mobility;Impaired vision - - - -  Risk for fall due to: Comment pt states she get light headed at times - - - -  Follow up Falls prevention discussed - Education provided;Falls evaluation completed Falls evaluation completed -    Any stairs in or around the home? Yes  If so, are there any without handrails? No  Home free of loose throw rugs in walkways, pet beds, electrical cords, etc? Yes  Adequate lighting in your home to reduce risk of falls? Yes   ASSISTIVE DEVICES UTILIZED TO PREVENT FALLS:  Life alert? No  Use of a cane, walker or w/c? No  Grab bars in the bathroom? Yes  Shower chair or bench in shower? Yes  Elevated toilet seat or a handicapped toilet? Yes   TIMED UP AND GO:  Was the test performed? No .     Cognitive Function:     6CIT Screen 02/19/2020 01/16/2019  What Year? 0 points 0 points  What month? 0 points 0 points  What time? - 0 points  Count back from 20 0 points 0 points  Months in reverse 0 points 0 points  Repeat phrase 4 points 0 points  Total Score - 0    Immunizations Immunization History  Administered Date(s) Administered  . Fluad Quad(high Dose 65+) 01/16/2019,  01/20/2020  . Influenza,inj,Quad PF,6+ Mos 02/14/2018  . PFIZER SARS-COV-2 Vaccination 07/03/2019, 08/06/2019  . Pneumococcal Conjugate-13 11/03/2015  . Pneumococcal Polysaccharide-23 08/17/2010, 02/14/2018  . Td 12/31/2002  . Tdap 10/25/2017  . Zoster Recombinat (Shingrix) 11/05/2018, 01/28/2019    TDAP status: Up to date Flu Vaccine status: Up to date Pneumococcal vaccine status: Up to date Covid-19 vaccine status: Completed vaccines  Qualifies for Shingles Vaccine? Yes   Zostavax completed Yes   Shingrix Completed?: Yes  Screening Tests Health Maintenance  Topic Date Due  . OPHTHALMOLOGY EXAM  10/02/2019  . MAMMOGRAM  01/30/2020  . HEMOGLOBIN A1C  06/29/2020  . DEXA SCAN  10/30/2020  . FOOT EXAM  12/30/2020  . COLONOSCOPY  10/13/2024  . TETANUS/TDAP  10/26/2027  . INFLUENZA VACCINE  Completed  . COVID-19 Vaccine  Completed  . Hepatitis C Screening  Completed  . PNA vac Low Risk Adult  Completed    Health Maintenance  Health Maintenance Due  Topic Date Due  . OPHTHALMOLOGY EXAM  10/02/2019  . MAMMOGRAM  01/30/2020    Colorectal cancer screening: Completed 10/14/19. Repeat every 5 years Mammogram status: Completed 01/30/19. Repeat every year Bone Density status: Completed 10/30/17. Results reflect: Bone density results: OSTEOPENIA. Repeat every 3 years.   Additional Screening:  Hepatitis C Screening: Completed 11/03/15  Vision Screening: Recommended annual ophthalmology exams for early detection of  glaucoma and other disorders of the eye. Is the patient up to date with their annual eye exam?  Yes  Who is the provider or what is the name of the office in which the patient attends annual eye exams? Dr Sharren Bridge   Dental Screening: Recommended annual dental exams for proper oral hygiene  Community Resource Referral / Chronic Care Management: CRR required this visit?  No   CCM required this visit?  No      Plan:     I have personally reviewed and noted the  following in the patient's chart:   . Medical and social history . Use of alcohol, tobacco or illicit drugs  . Current medications and supplements . Functional ability and status . Nutritional status . Physical activity . Advanced directives . List of other physicians . Hospitalizations, surgeries, and ER visits in previous 12 months . Vitals . Screenings to include cognitive, depression, and falls . Referrals and appointments  In addition, I have reviewed and discussed with patient certain preventive protocols, quality metrics, and best practice recommendations. A written personalized care plan for preventive services as well as general preventive health recommendations were provided to patient.     [name], LPN   [id number]   Nurse Notes: Pt rescheduled mammogram until Nov 8th 2021, Pt also stated eye exam will be in November as well

## 2020-02-19 NOTE — Patient Instructions (Addendum)
Jessica [name] , Thank you for taking time to come for your Medicare Wellness Visit. I appreciate your ongoing commitment to your health goals. Please review the following plan we discussed and let me know if I can assist you in the future.   Screening recommendations/referrals: Colonoscopy: Done 10/14/19 Mammogram: Done 01/30/19 Bone Density: Done 10/30/17 Recommended yearly ophthalmology/optometry visit for glaucoma screening and checkup Recommended yearly dental visit for hygiene and checkup  Vaccinations: Influenza vaccine: Done 01/20/20 Pneumococcal vaccine: Up to date Tdap vaccine: Up to date Shingles vaccine: Completed 7/7 & 01/28/19   Covid-19:Done 3/4 & 08/06/19  Advanced directives: Please bring a copy of your health care power of attorney and living will to the office at your convenience.  Conditions/risks identified: Lose weight  Next appointment: Follow up in one year for your annual wellness visit    Preventive Care 65 Years and Older, Female Preventive care refers to lifestyle choices and visits with your health care provider that can promote health and wellness. What does preventive care include?  A yearly physical exam. This is also called an annual well check.  Dental exams once or twice a year.  Routine eye exams. Ask your health care provider how often you should have your eyes checked.  Personal lifestyle choices, including:  Daily care of your teeth and gums.  Regular physical activity.  Eating a healthy diet.  Avoiding tobacco and drug use.  Limiting alcohol use.  Practicing safe sex.  Taking low-dose aspirin every day.  Taking vitamin and mineral supplements as recommended by your health care provider. What happens during an annual well check? The services and screenings done by your health care provider during your annual well check will depend on your age, overall health, lifestyle risk factors, and family history of disease. Counseling  Your health  care provider may ask you questions about your:  Alcohol use.  Tobacco use.  Drug use.  Emotional well-being.  Home and relationship well-being.  Sexual activity.  Eating habits.  History of falls.  Memory and ability to understand (cognition).  Work and work Statistician.  Reproductive health. Screening  You may have the following tests or measurements:  Height, weight, and BMI.  Blood pressure.  Lipid and cholesterol levels. These may be checked every 5 years, or more frequently if you are over 90 years old.  Skin check.  Lung cancer screening. You may have this screening every year starting at age 64 if you have a 30-pack-year history of smoking and currently smoke or have quit within the past 15 years.  Fecal occult blood test (FOBT) of the stool. You may have this test every year starting at age 68.  Flexible sigmoidoscopy or colonoscopy. You may have a sigmoidoscopy every 5 years or a colonoscopy every 10 years starting at age 61.  Hepatitis C blood test.  Hepatitis B blood test.  Sexually transmitted disease (STD) testing.  Diabetes screening. This is done by checking your blood sugar (glucose) after you have not eaten for a while (fasting). You may have this done every 1-3 years.  Bone density scan. This is done to screen for osteoporosis. You may have this done starting at age 43.  Mammogram. This may be done every 1-2 years. Talk to your health care provider about how often you should have regular mammograms. Talk with your health care provider about your test results, treatment options, and if necessary, the need for more tests. Vaccines  Your health care provider may recommend certain vaccines,  such as:  Influenza vaccine. This is recommended every year.  Tetanus, diphtheria, and acellular pertussis (Tdap, Td) vaccine. You may need a Td booster every 10 years.  Zoster vaccine. You may need this after age 33.  Pneumococcal 13-valent conjugate  (PCV13) vaccine. One dose is recommended after age 21.  Pneumococcal polysaccharide (PPSV23) vaccine. One dose is recommended after age 107. Talk to your health care provider about which screenings and vaccines you need and how often you need them. This information is not intended to replace advice given to you by your health care provider. Make sure you discuss any questions you have with your health care provider. Document Released: 05/14/2015 Document Revised: 01/05/2016 Document Reviewed: 02/16/2015 Elsevier Interactive Patient Education  2017 [place] Prevention in the Home Falls can cause injuries. They can happen to people of all ages. There are many things you can do to make your home safe and to help prevent falls. What can I do on the outside of my home?  Regularly fix the edges of walkways and driveways and fix any cracks.  Remove anything that might make you trip as you walk through a door, such as a raised step or threshold.  Trim any bushes or trees on the path to your home.  Use bright outdoor lighting.  Clear any walking paths of anything that might make someone trip, such as rocks or tools.  Regularly check to see if handrails are loose or broken. Make sure that both sides of any steps have handrails.  Any raised decks and porches should have guardrails on the edges.  Have any leaves, snow, or ice cleared regularly.  Use sand or salt on walking paths during winter.  Clean up any spills in your garage right away. This includes oil or grease spills. What can I do in the bathroom?  Use night lights.  Install grab bars by the toilet and in the tub and shower. Do not use towel bars as grab bars.  Use non-skid mats or decals in the tub or shower.  If you need to sit down in the shower, use a plastic, non-slip stool.  Keep the floor dry. Clean up any water that spills on the floor as soon as it happens.  Remove soap buildup in the tub or shower  regularly.  Attach bath mats securely with double-sided non-slip rug tape.  Do not have throw rugs and other things on the floor that can make you trip. What can I do in the bedroom?  Use night lights.  Make sure that you have a light by your bed that is easy to reach.  Do not use any sheets or blankets that are too big for your bed. They should not hang down onto the floor.  Have a firm chair that has side arms. You can use this for support while you get dressed.  Do not have throw rugs and other things on the floor that can make you trip. What can I do in the kitchen?  Clean up any spills right away.  Avoid walking on wet floors.  Keep items that you use a lot in easy-to-reach places.  If you need to reach something above you, use a strong step stool that has a grab bar.  Keep electrical cords out of the way.  Do not use floor polish or wax that makes floors slippery. If you must use wax, use non-skid floor wax.  Do not have throw rugs and other things on  the floor that can make you trip. What can I do with my stairs?  Do not leave any items on the stairs.  Make sure that there are handrails on both sides of the stairs and use them. Fix handrails that are broken or loose. Make sure that handrails are as long as the stairways.  Check any carpeting to make sure that it is firmly attached to the stairs. Fix any carpet that is loose or worn.  Avoid having throw rugs at the top or bottom of the stairs. If you do have throw rugs, attach them to the floor with carpet tape.  Make sure that you have a light switch at the top of the stairs and the bottom of the stairs. If you do not have them, ask someone to add them for you. What else can I do to help prevent falls?  Wear shoes that:  Do not have high heels.  Have rubber bottoms.  Are comfortable and fit you well.  Are closed at the toe. Do not wear sandals.  If you use a stepladder:  Make sure that it is fully  opened. Do not climb a closed stepladder.  Make sure that both sides of the stepladder are locked into place.  Ask someone to hold it for you, if possible.  Clearly mark and make sure that you can see:  Any grab bars or handrails.  First and last steps.  Where the edge of each step is.  Use tools that help you move around (mobility aids) if they are needed. These include:  Canes.  Walkers.  Scooters.  Crutches.  Turn on the lights when you go into a dark area. Replace any light bulbs as soon as they burn out.  Set up your furniture so you have a clear path. Avoid moving your furniture around.  If any of your floors are uneven, fix them.  If there are any pets around you, be aware of where they are.  Review your medicines with your doctor. Some medicines can make you feel dizzy. This can increase your chance of falling. Ask your doctor what other things that you can do to help prevent falls. This information is not intended to replace advice given to you by your health care provider. Make sure you discuss any questions you have with your health care provider. Document Released: 02/11/2009 Document Revised: 09/23/2015 Document Reviewed: 05/22/2014 Elsevier Interactive Patient Education  2017 [employer].

## 2020-02-28 IMAGING — DX DG CERVICAL SPINE COMPLETE 4+V
5 series · 5 of 5 positions shown · non-contrast
Comparison: Cervical spine radiograph dated 07/27/2015.

CLINICAL DATA: 68-year-old female with chronic neck pain.

EXAM:
CERVICAL SPINE - COMPLETE 4+ VIEW

[c-spine lat]
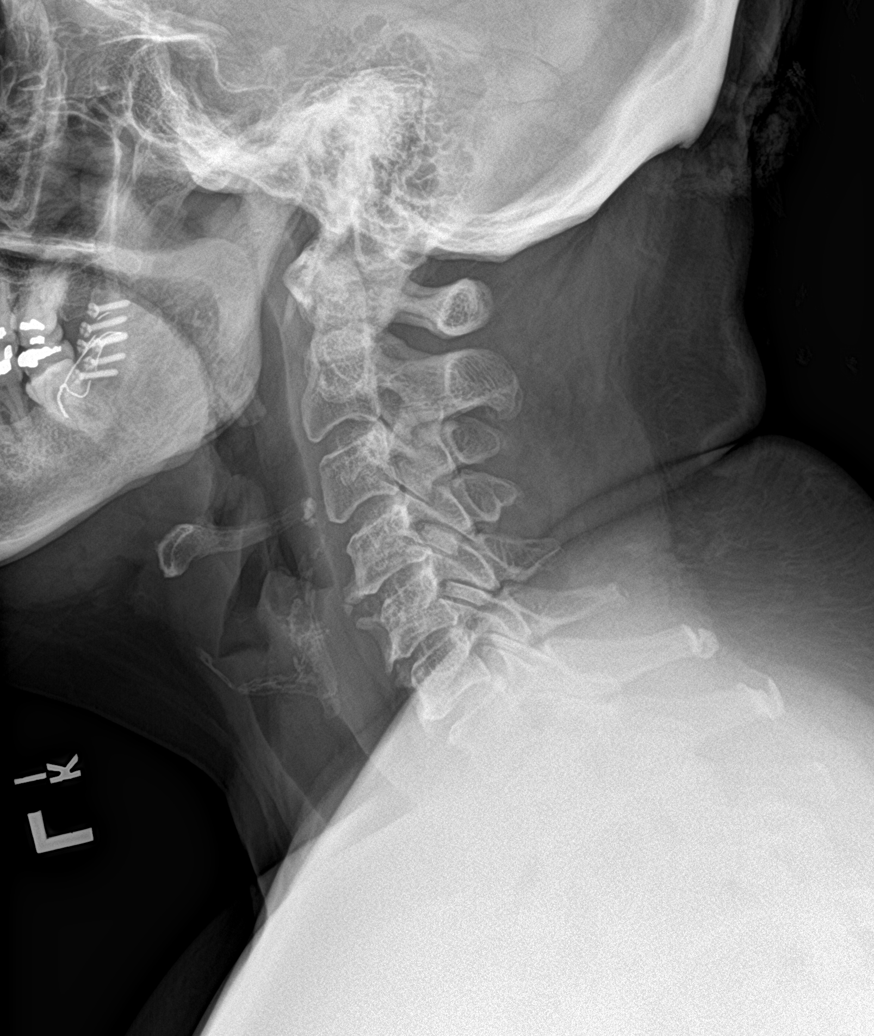

[c-spine obl (1 of 2)]
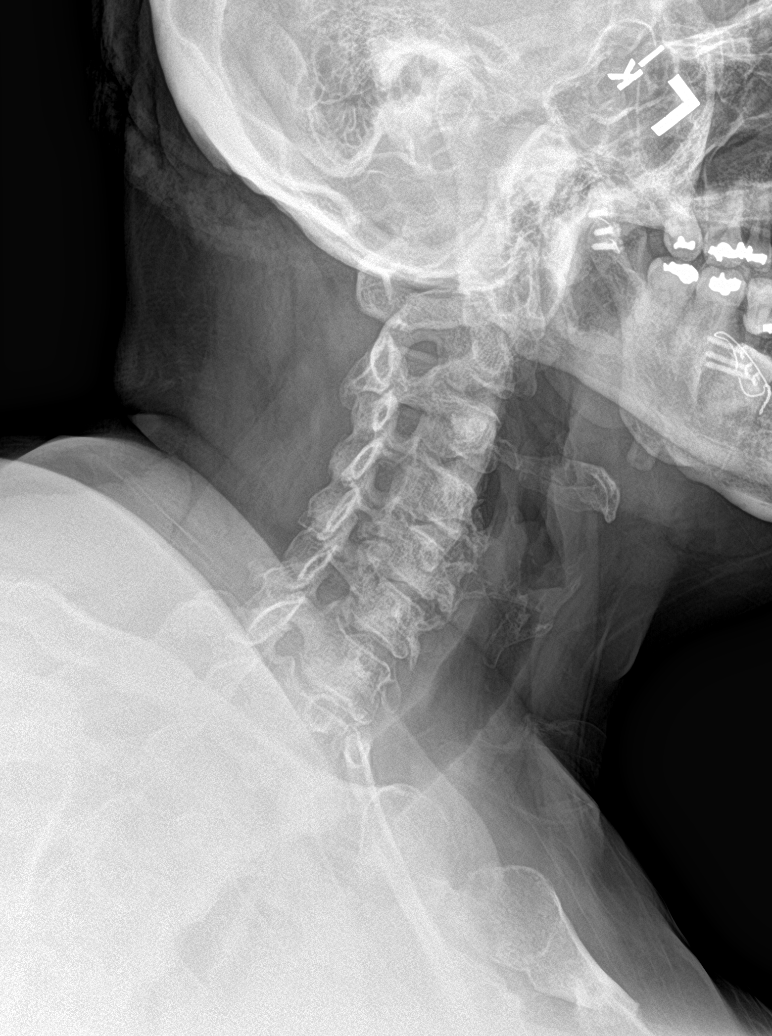

[c-spine obl (2 of 2)]
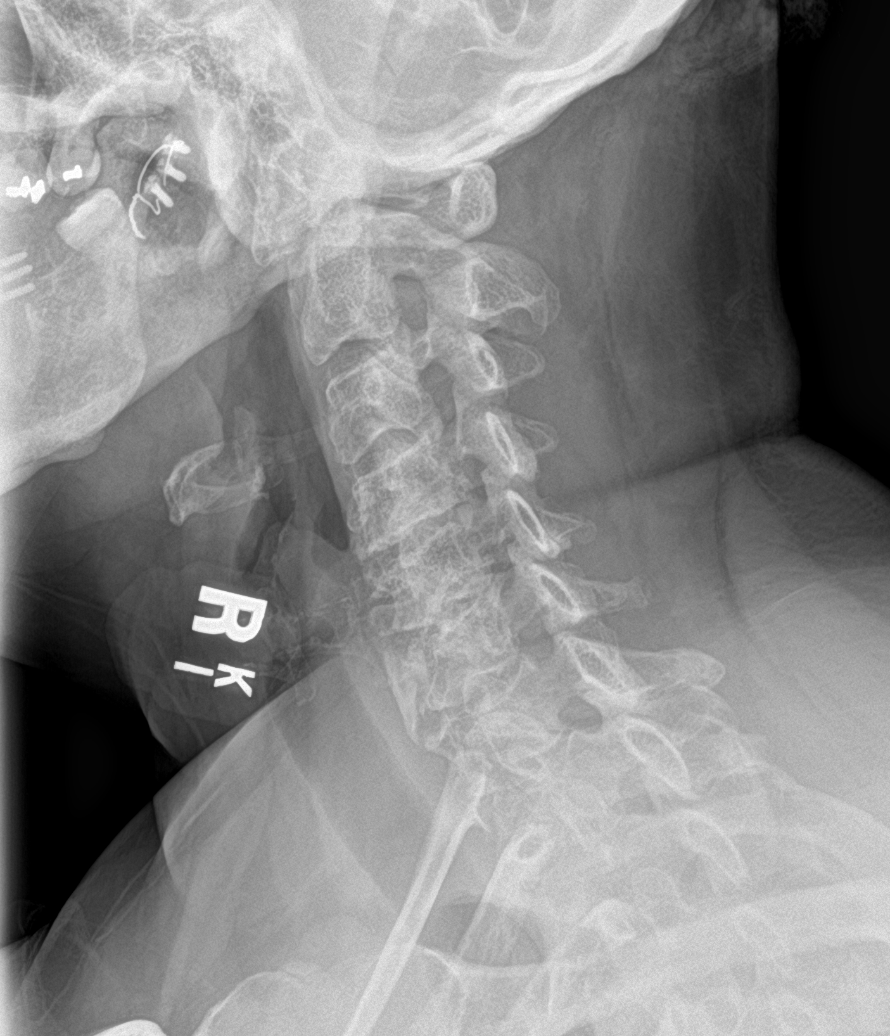

[c-spine ap]
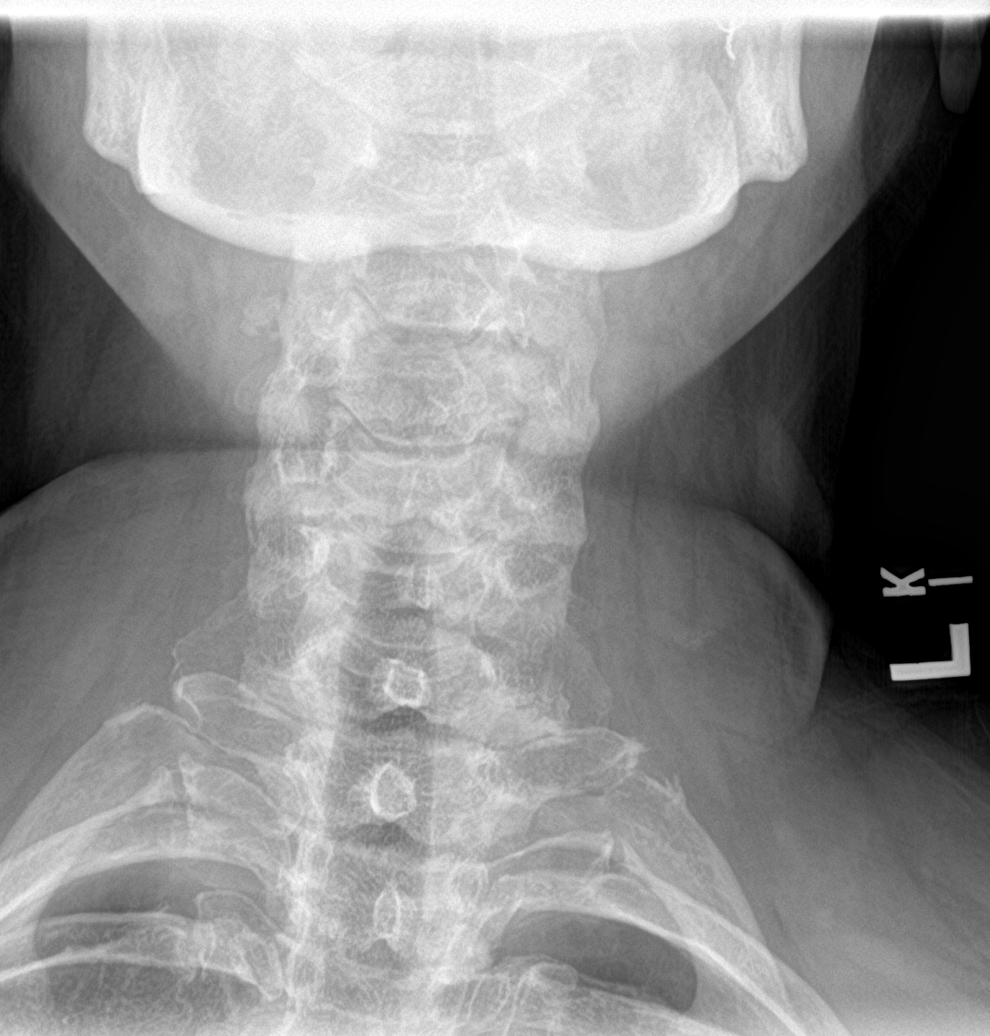

[c-spine open mouth]
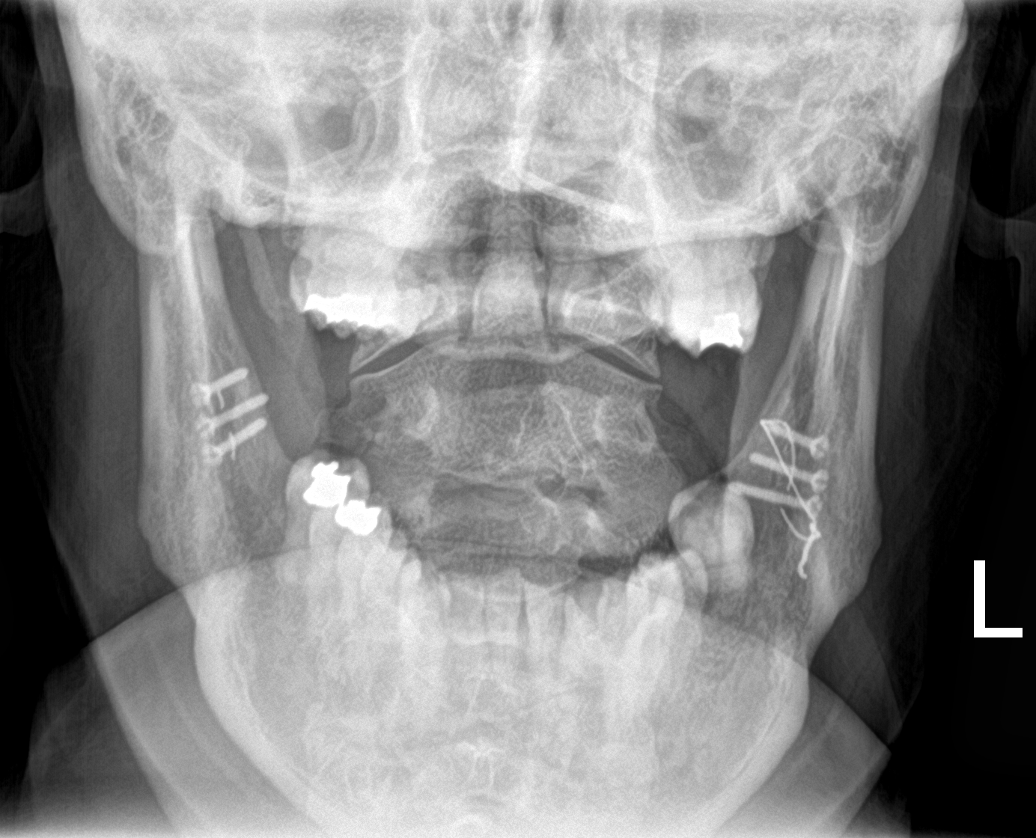

[5 of 5 positions shown; findings below may reference images not displayed]

FINDINGS: There is no acute fracture or subluxation of the cervical spine.
Degenerative changes primarily at C4-C5 and C5-C6 slightly
progressed since the prior radiograph. Bilateral C5-C6 neural
foramina narrowing. The visualized posterior elements and odontoid
appear intact. There is anatomic alignment the lateral masses of C1
and C2. The soft tissues are unremarkable. Mandibular dental implant
screws noted.
IMPRESSION: 1. No acute fracture or subluxation.
2. Degenerative changes of the cervical spine slightly progressed
since the prior radiograph.

## 2020-03-11 ENCOUNTER — Other Ambulatory Visit: Payer: Self-pay

## 2020-03-11 ENCOUNTER — Encounter (INDEPENDENT_AMBULATORY_CARE_PROVIDER_SITE_OTHER): Payer: Self-pay | Admitting: Bariatrics

## 2020-03-11 ENCOUNTER — Ambulatory Visit (INDEPENDENT_AMBULATORY_CARE_PROVIDER_SITE_OTHER): Payer: Medicare Other | Admitting: Bariatrics

## 2020-03-11 VITALS — BP 135/84 | HR 83 | Temp 98.5°F | Ht 65.0 in | Wt 219.0 lb

## 2020-03-11 DIAGNOSIS — Z794 Long term (current) use of insulin: Secondary | ICD-10-CM

## 2020-03-11 DIAGNOSIS — E7800 Pure hypercholesterolemia, unspecified: Secondary | ICD-10-CM

## 2020-03-11 DIAGNOSIS — E1169 Type 2 diabetes mellitus with other specified complication: Secondary | ICD-10-CM | POA: Diagnosis not present

## 2020-03-11 DIAGNOSIS — Z6836 Body mass index (BMI) 36.0-36.9, adult: Secondary | ICD-10-CM

## 2020-03-11 DIAGNOSIS — E785 Hyperlipidemia, unspecified: Secondary | ICD-10-CM | POA: Diagnosis not present

## 2020-03-11 DIAGNOSIS — E559 Vitamin D deficiency, unspecified: Secondary | ICD-10-CM

## 2020-03-11 DIAGNOSIS — E118 Type 2 diabetes mellitus with unspecified complications: Secondary | ICD-10-CM | POA: Diagnosis not present

## 2020-03-11 DIAGNOSIS — E1143 Type 2 diabetes mellitus with diabetic autonomic (poly)neuropathy: Secondary | ICD-10-CM

## 2020-03-11 MED ORDER — VITAMIN D (ERGOCALCIFEROL) 1.25 MG (50000 UNIT) PO CAPS: 50000.0000 [IU] | ORAL_CAPSULE | ORAL | 0 refills | Status: DC

## 2020-03-11 NOTE — Progress Notes (Signed)
Chief Complaint:   OBESITY Jessica [name] is here to discuss her progress with her obesity treatment plan along with follow-up of her obesity related diagnoses. Jessica [name] is on the Category 3 Plan and states she is following her eating plan approximately 90-95% of the time. Jessica [name] states she is walking 10,000 steps 7 times per week.  Today's visit was #: 13 Starting weight: 246 lbs Starting date: 03/12/2019 Today's weight: 219 lbs Today's date: 03/11/2020 Total lbs lost to date: 27 Total lbs lost since last in-office visit: 2  Interim History: Jessica [name] is down 2 lbs since her last visit.  Subjective:   Vitamin D deficiency. No nausea, vomiting, or muscle weakness.    Ref. Range 07/24/2019 16:30  Vitamin D, 25-Hydroxy Latest Ref Range: 30.0 - 100.0 ng/mL 57.2   Type 2 diabetes mellitus with diabetic autonomic neuropathy, with long-term current use of insulin (Jessica [name]). Jessica [name] is on Humulin insulin.   Lab Results  Component Value Date   HGBA1C 7.1 (A) 12/31/2019   HGBA1C 6.9 (A) 10/28/2019   HGBA1C 6.7 (H) 07/24/2019   Lab Results  Component Value Date   MICROALBUR 0.7 10/25/2017   LDLCALC 74 10/16/2018   CREATININE 1.02 09/04/2019   No results found for: INSULIN  Hypercholesterolemia. Jessica [name] is taking Lipitor.  Assessment/Plan:   Vitamin D deficiency. Low Vitamin D level contributes to fatigue and are associated with obesity, breast, and colon cancer. She was given a prescription for Vitamin D, Ergocalciferol, (DRISDOL) 1.25 MG (50000 UNIT) CAPS capsule every week #4 with 0 refills and VITAMIN D 25 Hydroxy (Vit-D Deficiency, Fractures) level will be checked today.   Type 2 diabetes mellitus with diabetic autonomic neuropathy, with long-term current use of insulin (Jessica [name]). Good blood sugar control is important to decrease the likelihood of diabetic complications such as nephropathy, neuropathy, limb loss, blindness, coronary artery disease, and death. Intensive lifestyle  modification including diet, exercise and weight loss are the first line of treatment for diabetes. Jessica [name] will continue her medication as directed. Comprehensive metabolic panel, Microalbumin / creatinine urine ratio labs will be checked today.   Hypercholesterolemia.  Lipid Panel With LDL/HDL Ratio will be checked today.  Class 2 severe obesity with serious comorbidity and body mass index (BMI) of 36.0 to 36.9 in adult, unspecified obesity type (Jessica [name]).  Jessica [name] is currently in the action stage of change. As such, her goal is to continue with weight loss efforts. She has agreed to the Category 3 Plan.   She will work on meal planning and intentional eating.   Exercise goals: Jessica [name] will continue walking 10,000 steps 7 times per week.  Behavioral modification strategies: increasing lean protein intake, decreasing simple carbohydrates, increasing vegetables, increasing water intake, decreasing eating out, no skipping meals, meal planning and cooking strategies, keeping healthy foods in the home and planning for success.  Jessica [name] has agreed to follow-up with our clinic in 3 weeks. She was informed of the importance of frequent follow-up visits to maximize her success with intensive lifestyle modifications for her multiple health conditions.   Jessica [name] was informed we would discuss her lab results at her next visit unless there is a critical issue that needs to be addressed sooner. Jessica [name] agreed to keep her next visit at the agreed upon time to discuss these results.  Objective:   Blood pressure 135/84, pulse 83, temperature 98.5 F (36.9 C), height 5' 5"  (1.651 m), weight 219 lb (99.3 kg), SpO2 97 %. Body mass index is 36.44 kg/m.  General: Cooperative, alert, well developed, in no acute distress. HEENT: Conjunctivae and lids unremarkable. Cardiovascular: Regular rhythm.  Lungs: Normal work of breathing. Neurologic: No focal deficits.   Lab Results  Component Value Date   CREATININE 1.02  09/04/2019   BUN 18 09/04/2019   NA 138 09/04/2019   K 4.2 09/04/2019   CL 104 09/04/2019   CO2 32 09/04/2019   Lab Results  Component Value Date   ALT 13 07/24/2019   AST 19 07/24/2019   ALKPHOS 92 07/24/2019   BILITOT 0.3 07/24/2019   Lab Results  Component Value Date   HGBA1C 7.1 (A) 12/31/2019   HGBA1C 6.9 (A) 10/28/2019   HGBA1C 6.7 (H) 07/24/2019   HGBA1C 7.2 (A) 02/20/2019   HGBA1C 6.9 (A) 11/20/2018   No results found for: INSULIN Lab Results  Component Value Date   TSH 2.15 10/16/2018   Lab Results  Component Value Date   CHOL 134 10/16/2018   HDL 47.40 10/16/2018   LDLCALC 74 10/16/2018   LDLDIRECT 143.2 11/23/2006   TRIG 64.0 10/16/2018   CHOLHDL 3 10/16/2018   Lab Results  Component Value Date   WBC 4.8 10/28/2018   HGB 13.4 10/28/2018   HCT 40.4 10/28/2018   MCV 97 10/28/2018   PLT 260 10/28/2018   Lab Results  Component Value Date   IRON 74 10/25/2017   TIBC 322 04/04/2012   Obesity Behavioral Intervention:   Approximately 15 minutes were spent on the discussion below.  ASK: We discussed the diagnosis of obesity with Jessica [name] today and Jessica [name] agreed to give [place] permission to discuss obesity behavioral modification therapy today.  ASSESS: Jessica [name] has the diagnosis of obesity and her BMI today is 36.6. Jessica [name] is in the action stage of change.   ADVISE: Jessica [name] was educated on the multiple health risks of obesity as well as the benefit of weight loss to improve her health. She was advised of the need for long term treatment and the importance of lifestyle modifications to improve her current health and to decrease her risk of future health problems.  AGREE: Multiple dietary modification options and treatment options were discussed and Jessica [name] agreed to follow the recommendations documented in the above note.  ARRANGE: Jessica [name] was educated on the importance of frequent visits to treat obesity as outlined per CMS and USPSTF guidelines and agreed to  schedule her next follow up appointment today.  Attestation Statements:   Reviewed by clinician on day of visit: allergies, medications, problem list, medical history, surgical history, family history, social history, and previous encounter notes.  [name], am acting as Location manager for [employer], DO   I have reviewed the above documentation for accuracy and completeness, and I agree with the above. [name], DO

## 2020-03-12 LAB — COMPREHENSIVE METABOLIC PANEL
ALT: 14 IU/L (ref 0–32)
AST: 15 IU/L (ref 0–40)
Albumin/Globulin Ratio: 1.4 (ref 1.2–2.2)
Albumin: 4 g/dL (ref 3.8–4.8)
Alkaline Phosphatase: 93 IU/L (ref 44–121)
BUN/Creatinine Ratio: 20 (ref 12–28)
BUN: 19 mg/dL (ref 8–27)
Bilirubin Total: 0.4 mg/dL (ref 0.0–1.2)
CO2: 26 mmol/L (ref 20–29)
Calcium: 9.6 mg/dL (ref 8.7–10.3)
Chloride: 102 mmol/L (ref 96–106)
Creatinine, Ser: 0.96 mg/dL (ref 0.57–1.00)
GFR calc Af Amer: 70 mL/min/{1.73_m2} (ref >59)
GFR calc non Af Amer: 61 mL/min/{1.73_m2} (ref >59)
Globulin, Total: 2.9 g/dL (ref 1.5–4.5)
Glucose: 159 mg/dL — ABNORMAL HIGH (ref 65–99)
Potassium: 4.8 mmol/L (ref 3.5–5.2)
Sodium: 141 mmol/L (ref 134–144)
Total Protein: 6.9 g/dL (ref 6.0–8.5)

## 2020-03-12 LAB — LIPID PANEL WITH LDL/HDL RATIO
Cholesterol, Total: 145 mg/dL (ref 100–199)
HDL: 58 mg/dL (ref >39)
LDL Chol Calc (NIH): 74 mg/dL (ref 0–99)
LDL/HDL Ratio: 1.3 ratio (ref 0.0–3.2)
Triglycerides: 62 mg/dL (ref 0–149)
VLDL Cholesterol Cal: 13 mg/dL (ref 5–40)

## 2020-03-12 LAB — MICROALBUMIN / CREATININE URINE RATIO
Creatinine, Urine: 20.8 mg/dL
Microalb/Creat Ratio: <14 mg/g creat (ref 0–29)
Microalbumin, Urine: <3 ug/mL

## 2020-03-12 LAB — VITAMIN D 25 HYDROXY (VIT D DEFICIENCY, FRACTURES): Vit D, 25-Hydroxy: 61.6 ng/mL (ref 30.0–100.0)

## 2020-03-15 ENCOUNTER — Ambulatory Visit: Admitting: Family Medicine

## 2020-03-15 ENCOUNTER — Encounter (INDEPENDENT_AMBULATORY_CARE_PROVIDER_SITE_OTHER): Payer: Self-pay | Admitting: Bariatrics

## 2020-03-18 ENCOUNTER — Telehealth: Payer: Self-pay

## 2020-03-18 NOTE — Telephone Encounter (Signed)
Spoke to pt, she would like for [place] to check to see if her insurance will approve for her to have bilateral visco injections.

## 2020-03-18 NOTE — Telephone Encounter (Signed)
Patient called asking for you stating she would like a call back. Asked if it was something I could help with and stated she wanted to talk to you about injections.

## 2020-03-19 ENCOUNTER — Encounter: Payer: Self-pay | Admitting: Family Medicine

## 2020-03-19 NOTE — Telephone Encounter (Signed)
Please call patient Sent as movie and I can't see rash well.   Raised, could be shingles.  I rec OV in am with work in clinic.  Is there room for her?  Dr. Jonni Sanger

## 2020-03-20 ENCOUNTER — Encounter: Payer: Self-pay | Admitting: Family Medicine

## 2020-03-20 ENCOUNTER — Other Ambulatory Visit: Payer: Self-pay

## 2020-03-20 ENCOUNTER — Ambulatory Visit (INDEPENDENT_AMBULATORY_CARE_PROVIDER_SITE_OTHER): Payer: Medicare Other | Admitting: Family Medicine

## 2020-03-20 VITALS — BP 131/77 | HR 69 | Temp 98.0°F | Ht 65.0 in | Wt 223.4 lb

## 2020-03-20 DIAGNOSIS — B029 Zoster without complications: Secondary | ICD-10-CM

## 2020-03-20 MED ORDER — VALACYCLOVIR HCL 1 G PO TABS: 1000.0000 mg | ORAL_TABLET | Freq: Three times a day (TID) | ORAL | 0 refills | Status: DC

## 2020-03-20 MED ORDER — ACETAMINOPHEN-CODEINE #3 300-30 MG PO TABS: 1.0000 | ORAL_TABLET | Freq: Four times a day (QID) | ORAL | 0 refills | Status: DC | PRN

## 2020-03-20 NOTE — Patient Instructions (Addendum)
1) stop your daily valtrex and start higher dose three times a day for 7 days. Can start back on your lower dose after these 7 days 2) contagious as long as new lesions are forming 3) take advil for pain and sending in tylenol 3 for severe pain 4) if lesions are not open can try capsaicin cream topically. But do not use on open lesions.  5) if pain worse or on face call dr. Jonni Sanger     Shingles  Shingles is an infection. It gives you a painful skin rash and blisters that have fluid in them. Shingles is caused by the same germ (virus) that causes chickenpox. Shingles only happens in people who:  Have had chickenpox.  Have been given a shot of medicine (vaccine) to protect against chickenpox. Shingles is rare in this group. The first symptoms of shingles may be itching, tingling, or pain in an area on your skin. A rash will show on your skin a few days or weeks later. The rash is likely to be on one side of your body. The rash usually has a shape like a belt or a band. Over time, the rash turns into fluid-filled blisters. The blisters will break open, change into scabs, and dry up. Medicines may:  Help with pain and itching.  Help you get better sooner.  Help to prevent long-term problems. Follow these instructions at home: Medicines  Take over-the-counter and prescription medicines only as told by your doctor.  Put on an anti-itch cream or numbing cream where you have a rash, blisters, or scabs. Do this as told by your doctor. Helping with itching and discomfort   Put cold, wet cloths (cold compresses) on the area of the rash or blisters as told by your doctor.  Cool baths can help you feel better. Try adding baking soda or dry oatmeal to the water to lessen itching. Do not bathe in hot water. Blister and rash care  Keep your rash covered with a loose bandage (dressing).  Wear loose clothing that does not rub on your rash.  Keep your rash and blisters clean. To do this, wash the  area with mild soap and cool water as told by your doctor.  Check your rash every day for signs of infection. Check for: ? More redness, swelling, or pain. ? Fluid or blood. ? Warmth. ? Pus or a bad smell.  Do not scratch your rash. Do not pick at your blisters. To help you to not scratch: ? Keep your fingernails clean and cut short. ? Wear gloves or mittens when you sleep, if scratching is a problem. General instructions  Rest as told by your doctor.  Keep all follow-up visits as told by your doctor. This is important.  Wash your hands often with soap and water. If soap and water are not available, use hand sanitizer. Doing this lowers your chance of getting a skin infection caused by germs (bacteria).  Your infection can cause chickenpox in people who have never had chickenpox or never got a shot of chickenpox vaccine. If you have blisters that did not change into scabs yet, try not to touch other people or be around other people, especially: ? Babies. ? Pregnant women. ? Children who have areas of red, itchy, or rough skin (eczema). ? Very old people who have transplants. ? People who have a long-term (chronic) sickness, like cancer or AIDS. Contact a doctor if:  Your pain does not get better with medicine.  Your pain  does not get better after the rash heals.  You have any signs of infection in the rash area. These signs include: ? More redness, swelling, or pain around the rash. ? Fluid or blood coming from the rash. ? The rash area feeling warm to the touch. ? Pus or a bad smell coming from the rash. Get help right away if:  The rash is on your face or nose.  You have pain in your face or pain by your eye.  You lose feeling on one side of your face.  You have trouble seeing.  You have ear pain, or you have ringing in your ear.  You have a loss of taste.  Your condition gets worse. Summary  Shingles gives you a painful skin rash and blisters that have fluid in  them.  Shingles is an infection. It is caused by the same germ (virus) that causes chickenpox.  Keep your rash covered with a loose bandage (dressing). Wear loose clothing that does not rub on your rash.  If you have blisters that did not change into scabs yet, try not to touch other people or be around people. This information is not intended to replace advice given to you by your health care provider. Make sure you discuss any questions you have with your health care provider. Document Revised: 08/09/2018 Document Reviewed: 12/20/2016 Elsevier Patient Education  2020 [employer].

## 2020-03-20 NOTE — Progress Notes (Signed)
Patient: Jessica [name] MRN: [id number] DOB: 1950-10-03 PCP: [name], MD     Subjective:  Chief Complaint  Patient presents with  . Rash    HPI: The patient is a 69 y.o. female who presents today for rash on left shoulder/neck area. She also says that she has a few bumps on the right shoulder, but not as many as the left. She noticed Wednesday morning. Se describes as very itchy, mild pain and shooting, stinging, stabbing pain every now and then. She has had nausea, headache and lightheadness that come and goes. She has also been tired all week.  She has been stressed, no recent illness. Her husband has been fighting aggressive prostate cancer.   She has had her shingles vaccines last year.   Review of Systems  Constitutional: Negative for fatigue.  Gastrointestinal: Negative for nausea.  Skin: Positive for rash. Negative for color change.  Neurological: Negative for dizziness and headaches.    Allergies Patient is allergic to gluten meal, nsaids, soy allergy, and wheat bran.  Past Medical History Patient  has a past medical history of Anxiety, Arthritis, Asthma, Autonomic neuropathy, Back pain, Cataract, Celiac disease, Constipation, Depression, Diabetes mellitus, Diabetic neuropathy ([place]), Diverticulosis, Fatty liver, Food allergy, Gastroparesis, GERD (gastroesophageal reflux disease), Hyperlipidemia, Hypertension, IBS (irritable bowel syndrome), IBS (irritable bowel syndrome), Joint pain, Kidney stones, Personal history of colonic polyps (03/2010), Pneumonia (11/03/2015), Post-operative nausea and vomiting, and SOB (shortness of breath).  Surgical History Patient  has a past surgical history that includes Tubal ligation; Appendectomy; left breast cyst removal (2000); Dilatation & currettage/hysteroscopy with resectoscope (N/A, 03/24/2013); Colonoscopy; Hysteroscopy with D & C (N/A, 11/08/2015); Cataract extraction (Right, 04/29/2018); Cataract extraction (Left, 03/2018); and  LEFT HEART CATH AND CORONARY ANGIOGRAPHY (N/A, 10/31/2018).  Family History Pateint's family history includes COPD in her father; Colon cancer in her sister; Diabetes in her father and sister; Endometrial cancer in her sister; Heart attack in her father; Heart failure in her father; High blood pressure in her father; Hypertension in her brother and mother; Other in her father.  Social History Patient  reports that she has never smoked. She has never used smokeless tobacco. She reports that she does not drink alcohol and does not use drugs.    Objective: Vitals:   03/20/20 1232  BP: 131/77  Pulse: 69  Temp: 98 F (36.7 C)  TempSrc: Temporal  SpO2: 99%  Weight: 223 lb 6.4 oz (101.3 kg)  Height: 5' 5"  (1.651 m)    Body mass index is 37.18 kg/m.  Physical Exam Vitals reviewed.  Constitutional:      Appearance: Normal appearance. She is obese.  HENT:     Head: Normocephalic and atraumatic.  Skin:    Findings: Rash (vesicular rash in linear from on erythematous base along left superior shoulder/neck area. few spots on right shoulder) present.  Neurological:     Mental Status: She is alert.        Assessment/plan: 1. Herpes zoster without complication -7 day course of valtrex. Hold her daily valtrex until 7 day course complete -tylenol 3 for severe pain and can take advil as needed -cool compresses -capsaicin cream as needed if no open lesions -please let us know if pain worse -avoid immunocompromised people while new lesions still forming.     This visit occurred during the SARS-CoV-2 public health emergency.  Safety protocols were in place, including screening questions prior to the visit, additional usage of staff PPE, and extensive cleaning of exam room  while observing appropriate contact time as indicated for disinfecting solutions.     Return if symptoms worsen or fail to improve.   Orma Flaming, MD [place]   03/20/2020

## 2020-03-24 ENCOUNTER — Other Ambulatory Visit (INDEPENDENT_AMBULATORY_CARE_PROVIDER_SITE_OTHER): Payer: Self-pay | Admitting: Bariatrics

## 2020-03-24 ENCOUNTER — Other Ambulatory Visit: Payer: Self-pay | Admitting: Family Medicine

## 2020-03-24 DIAGNOSIS — E559 Vitamin D deficiency, unspecified: Secondary | ICD-10-CM

## 2020-03-29 ENCOUNTER — Ambulatory Visit: Admitting: Family Medicine

## 2020-03-29 NOTE — Telephone Encounter (Signed)
Pt is scheduled for injections on 12/8.

## 2020-03-30 ENCOUNTER — Telehealth: Payer: Self-pay

## 2020-03-30 NOTE — Telephone Encounter (Signed)
Patient called in asking if she would be okay to go to her doctor's appointment on Thursday, as she was diagnosed with Singles on 11/20, she has said the blisters have scabbed up but wanted to get an okay.

## 2020-03-31 ENCOUNTER — Other Ambulatory Visit (INDEPENDENT_AMBULATORY_CARE_PROVIDER_SITE_OTHER): Payer: Self-pay | Admitting: Bariatrics

## 2020-03-31 DIAGNOSIS — E559 Vitamin D deficiency, unspecified: Secondary | ICD-10-CM

## 2020-03-31 NOTE — Telephone Encounter (Signed)
Patient aware of PCP recommendations listed below

## 2020-03-31 NOTE — Telephone Encounter (Signed)
Please advise 

## 2020-03-31 NOTE — Telephone Encounter (Signed)
Yes, that is fine. 

## 2020-04-01 ENCOUNTER — Other Ambulatory Visit: Payer: Self-pay

## 2020-04-01 ENCOUNTER — Ambulatory Visit (INDEPENDENT_AMBULATORY_CARE_PROVIDER_SITE_OTHER): Payer: Medicare Other | Admitting: Bariatrics

## 2020-04-01 ENCOUNTER — Encounter (INDEPENDENT_AMBULATORY_CARE_PROVIDER_SITE_OTHER): Payer: Self-pay | Admitting: Bariatrics

## 2020-04-01 VITALS — BP 146/85 | HR 74 | Temp 98.1°F | Ht 65.0 in | Wt 220.0 lb

## 2020-04-01 DIAGNOSIS — E559 Vitamin D deficiency, unspecified: Secondary | ICD-10-CM

## 2020-04-01 DIAGNOSIS — E1169 Type 2 diabetes mellitus with other specified complication: Secondary | ICD-10-CM | POA: Diagnosis not present

## 2020-04-01 DIAGNOSIS — E119 Type 2 diabetes mellitus without complications: Secondary | ICD-10-CM

## 2020-04-01 DIAGNOSIS — Z6836 Body mass index (BMI) 36.0-36.9, adult: Secondary | ICD-10-CM

## 2020-04-01 DIAGNOSIS — E669 Obesity, unspecified: Secondary | ICD-10-CM

## 2020-04-01 MED ORDER — VITAMIN D (ERGOCALCIFEROL) 1.25 MG (50000 UNIT) PO CAPS: 50000.0000 [IU] | ORAL_CAPSULE | ORAL | 0 refills | Status: DC

## 2020-04-01 NOTE — Progress Notes (Signed)
Chief Complaint:   OBESITY Jessica [name] is here to discuss her progress with her obesity treatment plan along with follow-up of her obesity related diagnoses. Jessica [name] is on the Category 3 Plan and states she is following her eating plan approximately 75% of the time. [name] states she is walking 7,000 steps 7 times per week.  Today's visit was #: 14 Starting weight: 246 lbs Starting date: 03/12/2019 Today's weight: 220 lbs Today's date: 04/01/2020 Total lbs lost to date: 26 Total lbs lost since last in-office visit: 0  Interim History: Jessica [name] is up 1 lb since her last visit and is up 1 lb of water according to the bioimpedance scale.  Subjective:   Vitamin D deficiency. No nausea, vomiting, or muscle weakness.    Ref. Range 03/11/2020 10:38  Vitamin D, 25-Hydroxy Latest Ref Range: 30.0 - 100.0 ng/mL 61.6   Type 2 diabetes mellitus with obesity ([place]).  Jessica [name] is taking Humulin 70/30. Fasting blood sugars 140's, but occasionally elevated.   Lab Results  Component Value Date   HGBA1C 7.1 (A) 12/31/2019   HGBA1C 6.9 (A) 10/28/2019   HGBA1C 6.7 (H) 07/24/2019   Lab Results  Component Value Date   MICROALBUR 0.7 10/25/2017   LDLCALC 74 03/11/2020   CREATININE 0.96 03/11/2020   No results found for: INSULIN  Assessment/Plan:   Vitamin D deficiency. Low Vitamin D level contributes to fatigue and are associated with obesity, breast, and colon cancer. She was given a prescription for Vitamin D, Ergocalciferol, (DRISDOL) 1.25 MG (50000 UNIT) CAPS capsule every week #4 with 0 refills and will follow-up for routine testing of Vitamin D, at least 2-3 times per year to avoid over-replacement.   Type 2 diabetes mellitus with obesity ([place]). Good blood sugar control is important to decrease the likelihood of diabetic complications such as nephropathy, neuropathy, limb loss, blindness, coronary artery disease, and death. Intensive lifestyle modification including diet, exercise and  weight loss are the first line of treatment for diabetes. Jessica [name] will continue her medication as directed.   Class 2 severe obesity with serious comorbidity and body mass index (BMI) of 36.0 to 36.9 in adult, unspecified obesity type ([place]).  Jessica [name] is currently in the action stage of change. As such, her goal is to continue with weight loss efforts. She has agreed to the Category 3 Plan.   She will work on meal planning and staying adherent to the plan.  We reviewed with the patient labs from 03/11/2020 including CMP, lipids, Vitamin D, glucose, and urine microalbumin.  Exercise goals: Jessica [name] will continue walking 7,000 steps 7 days a week.  Behavioral modification strategies: increasing lean protein intake, decreasing simple carbohydrates, increasing vegetables, increasing water intake, decreasing eating out, no skipping meals, meal planning and cooking strategies, keeping healthy foods in the home and planning for success.  Jessica [name] has agreed to follow-up with our clinic in 4 weeks. She was informed of the importance of frequent follow-up visits to maximize her success with intensive lifestyle modifications for her multiple health conditions.   Objective:   Blood pressure (!) 146/85, pulse 74, temperature 98.1 F (36.7 C), height 5' 5"  (1.651 m), weight 220 lb (99.8 kg), SpO2 98 %. Body mass index is 36.61 kg/m.  General: Cooperative, alert, well developed, in no acute distress. HEENT: Conjunctivae and lids unremarkable. Cardiovascular: Regular rhythm.  Lungs: Normal work of breathing. Neurologic: No focal deficits.   Lab Results  Component Value Date   CREATININE 0.96 03/11/2020   BUN  19 03/11/2020   NA 141 03/11/2020   K 4.8 03/11/2020   CL 102 03/11/2020   CO2 26 03/11/2020   Lab Results  Component Value Date   ALT 14 03/11/2020   AST 15 03/11/2020   ALKPHOS 93 03/11/2020   BILITOT 0.4 03/11/2020   Lab Results  Component Value Date   HGBA1C 7.1 (A) 12/31/2019    HGBA1C 6.9 (A) 10/28/2019   HGBA1C 6.7 (H) 07/24/2019   HGBA1C 7.2 (A) 02/20/2019   HGBA1C 6.9 (A) 11/20/2018   No results found for: INSULIN Lab Results  Component Value Date   TSH 2.15 10/16/2018   Lab Results  Component Value Date   CHOL 145 03/11/2020   HDL 58 03/11/2020   LDLCALC 74 03/11/2020   LDLDIRECT 143.2 11/23/2006   TRIG 62 03/11/2020   CHOLHDL 3 10/16/2018   Lab Results  Component Value Date   WBC 4.8 10/28/2018   HGB 13.4 10/28/2018   HCT 40.4 10/28/2018   MCV 97 10/28/2018   PLT 260 10/28/2018   Lab Results  Component Value Date   IRON 74 10/25/2017   TIBC 322 04/04/2012   Obesity Behavioral Intervention:   Approximately 15 minutes were spent on the discussion below.  ASK: We discussed the diagnosis of obesity with Jessica [name] today and Jessica [name] agreed to give [place] permission to discuss obesity behavioral modification therapy today.  ASSESS: Jessica [name] has the diagnosis of obesity and her BMI today is 36.7. Jessica [name] is in the action stage of change.   ADVISE: Jessica [name] was educated on the multiple health risks of obesity as well as the benefit of weight loss to improve her health. She was advised of the need for long term treatment and the importance of lifestyle modifications to improve her current health and to decrease her risk of future health problems.  AGREE: Multiple dietary modification options and treatment options were discussed and Jessica [name] agreed to follow the recommendations documented in the above note.  ARRANGE: Jessica [name] was educated on the importance of frequent visits to treat obesity as outlined per CMS and USPSTF guidelines and agreed to schedule her next follow up appointment today.  Attestation Statements:   Reviewed by clinician on day of visit: allergies, medications, problem list, medical history, surgical history, family history, social history, and previous encounter notes.  Jessica [name], am acting as Location manager for [employer], DO    I have reviewed the above documentation for accuracy and completeness, and I agree with the above. Jessica Lesch, DO

## 2020-04-05 ENCOUNTER — Encounter: Payer: Self-pay | Admitting: Family Medicine

## 2020-04-05 ENCOUNTER — Other Ambulatory Visit: Payer: Self-pay

## 2020-04-05 ENCOUNTER — Ambulatory Visit (INDEPENDENT_AMBULATORY_CARE_PROVIDER_SITE_OTHER): Payer: Medicare Other | Admitting: Family Medicine

## 2020-04-05 VITALS — BP 138/80 | HR 66 | Temp 97.2°F | Ht 65.0 in | Wt 224.4 lb

## 2020-04-05 DIAGNOSIS — I10 Essential (primary) hypertension: Secondary | ICD-10-CM | POA: Diagnosis not present

## 2020-04-05 DIAGNOSIS — J4520 Mild intermittent asthma, uncomplicated: Secondary | ICD-10-CM | POA: Diagnosis not present

## 2020-04-05 DIAGNOSIS — Z794 Long term (current) use of insulin: Secondary | ICD-10-CM

## 2020-04-05 DIAGNOSIS — R29898 Other symptoms and signs involving the musculoskeletal system: Secondary | ICD-10-CM | POA: Diagnosis not present

## 2020-04-05 DIAGNOSIS — M8580 Other specified disorders of bone density and structure, unspecified site: Secondary | ICD-10-CM | POA: Diagnosis not present

## 2020-04-05 DIAGNOSIS — E782 Mixed hyperlipidemia: Secondary | ICD-10-CM | POA: Diagnosis not present

## 2020-04-05 DIAGNOSIS — R600 Localized edema: Secondary | ICD-10-CM

## 2020-04-05 DIAGNOSIS — R1312 Dysphagia, oropharyngeal phase: Secondary | ICD-10-CM

## 2020-04-05 DIAGNOSIS — E1142 Type 2 diabetes mellitus with diabetic polyneuropathy: Secondary | ICD-10-CM | POA: Diagnosis not present

## 2020-04-05 DIAGNOSIS — E118 Type 2 diabetes mellitus with unspecified complications: Secondary | ICD-10-CM | POA: Diagnosis not present

## 2020-04-05 DIAGNOSIS — H43813 Vitreous degeneration, bilateral: Secondary | ICD-10-CM | POA: Diagnosis not present

## 2020-04-05 DIAGNOSIS — E113291 Type 2 diabetes mellitus with mild nonproliferative diabetic retinopathy without macular edema, right eye: Secondary | ICD-10-CM | POA: Diagnosis not present

## 2020-04-05 DIAGNOSIS — E1169 Type 2 diabetes mellitus with other specified complication: Secondary | ICD-10-CM | POA: Diagnosis not present

## 2020-04-05 DIAGNOSIS — L821 Other seborrheic keratosis: Secondary | ICD-10-CM | POA: Diagnosis not present

## 2020-04-05 LAB — HM DIABETES EYE EXAM

## 2020-04-05 MED ORDER — FUROSEMIDE 20 MG PO TABS
20.0000 mg | ORAL_TABLET | Freq: Every day | ORAL | Status: DC
Start: 2020-04-05 — End: 2020-08-12

## 2020-04-05 NOTE — Progress Notes (Signed)
Subjective  Chief Complaint  Patient presents with  . Annual Exam    fasting, episode of being unable to move left arm - had to think about it before arm would move  . Dysphagia    worsening over the past several months  . Nevus    right hip and left lower back  . Hypertension  . Hyperlipidemia  . Diabetes    HPI: Jessica [name] is a 69 y.o. female who presents to [facility] [place] today for a Female Wellness Visit. She also has the concerns and/or needs as listed above in the chief complaint. These will be addressed in addition to the Health Maintenance Visit.   Wellness Visit: annual visit with health maintenance review and exam without Pap   HM: Mammogram had to be postponed until January due to her episode of shingles.  Fortunately she has recovered well.  She has been vaccinated.  Colonoscopy is up-to-date.  Bone density is up-to-date.  She does have osteopenia. Chronic disease f/u and/or acute problem visit: (deemed necessary to be done in addition to the wellness visit):  She reports 2 distinct episodes where she was washing dishes and went to move her left arm but could not.  She says this lasted for only seconds to a minute but she definitely felt a heaviness in her left arm and had to think about trying to move it.  When she is able to move her arm is moved normally.  She had no other symptoms.  Specifically nerve blurred vision, double vision, dysarthria, weakness in the leg, numbness or tingling, chest pain or heaviness in the chest.  She complains having the feeling that previously go down the wrong "pipe".  Over the last 2 to 3 months, she feels when she is eating solids food tends to get stuck in the back of her throat or swallowing does not seem to be normal.  At times she feels that she has to cough to clear fluid.  She has no odynophagia.  She does have chronic reflux but says this is well controlled on a PPI.  She does see gastroenterology and  reports her most recent EGD being normal within the last year or 2.  She denies nausea, vomiting or regurgitation.  She has no problems with liquids.  She has a mole that was bleeding on her left abdomen that she would like looked at.  Is currently healed.  Bilateral lower extremity edema treated with Lasix daily.  Much improved.  No problems with asthma no recent exacerbations.  Rare use of albuterol  Type 2 diabetes managed by endocrinology and fairly well controlled with most recent A1c of 7.2.  She does have peripheral neuropathy.  No foot sores.  She is up-to-date on her eye exam and immunizations.  Hyperlipidemia well-controlled by recent lab work.  She continues on her statin.  LDL is just above goal at 74.  No adverse effects.  Hypertension is well controlled.  Assessment  1. Essential hypertension   2. Combined hyperlipidemia associated with type 2 diabetes mellitus ([place])   3. Type 2 diabetes mellitus with complication, with long-term current use of insulin ([place])   4. Osteopenia, unspecified location   5. Bilateral lower extremity edema   6. Mild intermittent asthma without complication   7. Peripheral sensory neuropathy due to type 2 diabetes mellitus ([place])   8. Oropharyngeal dysphagia   9. Right arm weakness   10. Seborrheic keratoses  Plan  Female Wellness Visit:  Age appropriate Health Maintenance and Prevention measures were discussed with patient. Included topics are cancer screening recommendations, ways to keep healthy (see AVS) including dietary and exercise recommendations, regular eye and dental care, use of seat belts, and avoidance of moderate alcohol use and tobacco use.   BMI: discussed patient's BMI and encouraged positive lifestyle modifications to help get to or maintain a target BMI.  HM needs and immunizations were addressed and ordered. See below for orders. See HM and immunization section for updates.  Routine labs and screening tests ordered  including cmp, cbc and lipids where appropriate.  Discussed recommendations regarding Vit D and calcium supplementation (see AVS)  Chronic disease management visit and/or acute problem visit:  Possible TIA with left upper extremity weakness: Start work-up with carotid Dopplers and echocardiogram.  Educated.  To emergency room if any further symptoms of stroke.  Hyperlipidemia, hypertension and diabetes are all fairly well controlled.  No changes in medications today.  Continue to work on healthy diet and weight loss.  Recheck lipids in 6 to 12 weeks.  Will push statin dose higher if remains above 70.  Patient agrees.  Extremity edema well controlled on Lasix with normal renal function and potassium levels.  Continue.  Oropharyngeal dysphagia, possible: Rec further work-up per GI.  Patient elects to see GI.  Recommend swallowing study.  Or barium swallow.  He has been well controlled.  Osteopenia: Continue calcium and vitamin D.  Recheck in 1 year.  Seborrheic keratoses: Educated.  Benign.  Reassured.   Follow up: 6 months recheck blood pressure Orders Placed This Encounter  Procedures  . US Carotid Duplex Bilateral  . ECHOCARDIOGRAM COMPLETE   Meds ordered this encounter  Medications  . furosemide (LASIX) 20 MG tablet    Sig: Take 1 tablet (20 mg total) by mouth daily.      Lifestyle: Body mass index is 37.34 kg/m. Wt Readings from Last 3 Encounters:  04/05/20 224 lb 6.4 oz (101.8 kg)  04/01/20 220 lb (99.8 kg)  03/20/20 223 lb 6.4 oz (101.3 kg)    Patient Active Problem List   Diagnosis Date Noted  . Celiac disease 02/14/2018    Priority: High  . Diabetic neuropathy associated with type 2 diabetes mellitus ([place]) 02/14/2018    Priority: High  . Class 2 obesity due to excess calories with body mass index (BMI) of 37.0 to 37.9 in adult 02/14/2018    Priority: High  . Type 2 diabetes mellitus with complication, with long-term current use of insulin ([place]) 09/23/2010     Priority: High  . Combined hyperlipidemia associated with type 2 diabetes mellitus ([place]) 01/29/2009    Priority: High  . Essential hypertension 10/22/2006    Priority: High    Qualifier: Diagnosis of  By: Marca Ancona RMA, Lucy     . Osteopenia 02/14/2018    Priority: Medium    dexa 2019   . Mild intermittent asthma 02/14/2018    Priority: Medium  . Degenerative arthritis of knee, bilateral 10/29/2015    Priority: Medium    Bilateral steroid injections 12/02/2015 Started Orthovisc 12/16/2015   . Degenerative cervical disc 07/27/2015    Priority: Medium  . OSA (obstructive sleep apnea) 02/28/2011    Priority: Medium  . Constipation, slow transit 09/23/2010    Priority: Medium  . FH: colon cancer 09/23/2010    Priority: Medium  . Personal history of colonic polyps 09/23/2010    Priority: Medium  . Diabetic gastroparesis ([place])  02/27/2007    Priority: Medium  . Diabetic autonomic neuropathy associated with type 2 diabetes mellitus ([place]) 10/22/2006    Priority: Medium  . Bilateral lower extremity edema 01/20/2020    Priority: Low  . Fibroma of tongue 06/06/2016    Priority: Low  . Renal calculi 01/13/2016    Priority: Low  . Peripheral sensory neuropathy due to type 2 diabetes mellitus ([place]) 04/05/2020  . Thyroid nodule 10/28/2019  . Abnormal stress test   . Family history of malignant neoplasm of endometrium 10/08/2018  . Foot pain, bilateral 07/17/2018   Health Maintenance  Topic Date Due  . MAMMOGRAM  05/06/2020 (Originally 01/30/2020)  . HEMOGLOBIN A1C  06/29/2020  . DEXA SCAN  10/30/2020  . FOOT EXAM  12/30/2020  . OPHTHALMOLOGY EXAM  04/05/2021  . COLONOSCOPY  10/13/2024  . TETANUS/TDAP  10/26/2027  . INFLUENZA VACCINE  Completed  . COVID-19 Vaccine  Completed  . Hepatitis C Screening  Completed  . PNA vac Low Risk Adult  Completed   Immunization History  Administered Date(s) Administered  . Fluad Quad(high Dose 65+) 01/16/2019, 01/20/2020  . Influenza,inj,Quad  PF,6+ Mos 02/14/2018  . PFIZER SARS-COV-2 Vaccination 07/03/2019, 08/06/2019  . Pneumococcal Conjugate-13 11/03/2015  . Pneumococcal Polysaccharide-23 08/17/2010, 02/14/2018  . Td 12/31/2002  . Tdap 10/25/2017  . Zoster Recombinat (Shingrix) 11/05/2018, 01/28/2019   We updated and reviewed the patient's past history in detail and it is documented below. Allergies: Patient is allergic to gluten meal, nsaids, soy allergy, and wheat bran. Past Medical History Patient  has a past medical history of Anxiety, Arthritis, Asthma, Autonomic neuropathy, Cataract, Celiac disease, Constipation, Depression, Diabetes mellitus, Diabetic neuropathy ([place]), Diverticulosis, Fatty liver, Gastroparesis, GERD (gastroesophageal reflux disease), Hyperlipidemia, Hypertension, IBS (irritable bowel syndrome), Kidney stones, Personal history of colonic polyps (03/2010), and Shingles (2021). Past Surgical History Patient  has a past surgical history that includes Tubal ligation; Appendectomy; left breast cyst removal (2000); Dilatation & currettage/hysteroscopy with resectoscope (N/A, 03/24/2013); Colonoscopy; Hysteroscopy with D & C (N/A, 11/08/2015); Cataract extraction (Right, 04/29/2018); Cataract extraction (Left, 03/2018); and LEFT HEART CATH AND CORONARY ANGIOGRAPHY (N/A, 10/31/2018). Family History: Patient family history includes COPD in her father; Colon cancer in her sister; Diabetes in her father and sister; Endometrial cancer in her sister; Heart attack in her father; Heart failure in her father; High blood pressure in her father; Hypertension in her brother and mother; Other in her father. Social History:  Patient  reports that she has never smoked. She has never used smokeless tobacco. She reports that she does not drink alcohol and does not use drugs.  Review of Systems: Constitutional: negative for fever or malaise Ophthalmic: negative for photophobia, double vision or loss of vision Cardiovascular: negative  for chest pain, dyspnea on exertion, or new LE swelling Respiratory: negative for SOB or persistent cough Gastrointestinal: negative for abdominal pain, change in bowel habits or melena Genitourinary: negative for dysuria or gross hematuria, no abnormal uterine bleeding or disharge Musculoskeletal: negative for new gait disturbance or muscular weakness Integumentary: negative for new or persistent rashes, no breast lumps Neurological: negative for TIA or stroke symptoms Psychiatric: negative for SI or delusions Allergic/Immunologic: negative for hives  Patient Care Team    Relationship Specialty Notifications Start End  [name], MD PCP - General Family Medicine  02/14/18   Jettie Booze, MD PCP - Cardiology Cardiology Admissions 11/12/18   Olga Millers, MD Attending Physician Obstetrics and Gynecology  04/04/12   Renato Shin, MD Consulting  Physician Endocrinology  02/14/18   Jerene Bears, MD Consulting Physician Gastroenterology  01/16/19   Stephannie Li, Ward Physician Ophthalmology  01/16/19   Garrel Ridgel, DPM Consulting Physician Podiatry  01/16/19     Objective  Vitals: BP 138/80   Pulse 66   Temp (!) 97.2 F (36.2 C) (Temporal)   Ht 5' 5"  (1.651 m)   Wt 224 lb 6.4 oz (101.8 kg)   SpO2 98%   BMI 37.34 kg/m  General:  Well developed, well nourished, no acute distress  Psych:  Alert and orientedx3,normal mood and affect HEENT:  Normocephalic, atraumatic, non-icteric sclera,  supple neck without adenopathy, mass or thyromegaly Cardiovascular:  Normal S1, S2, RRR without gallop, rub or murmur Respiratory:  Good breath sounds bilaterally, CTAB with normal respiratory effort Gastrointestinal: normal bowel sounds, soft, non-tender, no noted masses. No HSM MSK: no deformities, contusions. Joints are without erythema or swelling.  Skin:  Warm, no rashes or suspicious lesions noted, 1 cm Seb K on left lower abdomen Neurologic:    Mental status is normal.  CN 2-11 are normal. Gross motor and sensory exams are normal. Normal gait. No tremor Breast Exam: No mass, skin retraction or nipple discharge is appreciated in either breast. No axillary adenopathy. Fibrocystic changes are not noted  No visits with results within 1 Day(s) from this visit.  Latest known visit with results is:  Office Visit on 03/11/2020  Component Date Value Ref Range Status  . Glucose 03/11/2020 159* 65 - 99 mg/dL Final  . BUN 03/11/2020 19  8 - 27 mg/dL Final  . Creatinine, Ser 03/11/2020 0.96  0.57 - 1.00 mg/dL Final  . GFR calc non Af Amer 03/11/2020 61  >59 mL/min/1.73 Final  . GFR calc Af Amer 03/11/2020 70  >59 mL/min/1.73 Final  . BUN/Creatinine Ratio 03/11/2020 20  12 - 28 Final  . Sodium 03/11/2020 141  134 - 144 mmol/L Final  . Potassium 03/11/2020 4.8  3.5 - 5.2 mmol/L Final  . Chloride 03/11/2020 102  96 - 106 mmol/L Final  . CO2 03/11/2020 26  20 - 29 mmol/L Final  . Calcium 03/11/2020 9.6  8.7 - 10.3 mg/dL Final  . Total Protein 03/11/2020 6.9  6.0 - 8.5 g/dL Final  . Albumin 03/11/2020 4.0  3.8 - 4.8 g/dL Final  . Globulin, Total 03/11/2020 2.9  1.5 - 4.5 g/dL Final  . Albumin/Globulin Ratio 03/11/2020 1.4  1.2 - 2.2 Final  . Bilirubin Total 03/11/2020 0.4  0.0 - 1.2 mg/dL Final  . Alkaline Phosphatase 03/11/2020 93  44 - 121 IU/L Final  . AST 03/11/2020 15  0 - 40 IU/L Final  . ALT 03/11/2020 14  0 - 32 IU/L Final  . Cholesterol, Total 03/11/2020 145  100 - 199 mg/dL Final  . Triglycerides 03/11/2020 62  0 - 149 mg/dL Final  . HDL 03/11/2020 58  >39 mg/dL Final  . VLDL Cholesterol Cal 03/11/2020 13  5 - 40 mg/dL Final  . LDL Chol Calc (NIH) 03/11/2020 74  0 - 99 mg/dL Final  . LDL/HDL Ratio 03/11/2020 1.3  0.0 - 3.2 ratio Final  . Vit D, 25-Hydroxy 03/11/2020 61.6  30.0 - 100.0 ng/mL Final  . Creatinine, Urine 03/11/2020 20.8  Not Estab. mg/dL Final  . Microalbumin, Urine 03/11/2020 <3.0  Not Estab. ug/mL Final  . Microalb/Creat Ratio 03/11/2020  <14  0 - 29 mg/g creat Final     Commons side effects, risks, benefits, and alternatives  for medications and treatment plan prescribed today were discussed, and the patient expressed understanding of the given instructions. Patient is instructed to call or message via MyChart if he/she has any questions or concerns regarding our treatment plan. No barriers to understanding were identified. We discussed Red Flag symptoms and signs in detail. Patient expressed understanding regarding what to do in case of urgent or emergency type symptoms.   Medication list was reconciled, printed and provided to the patient in AVS. Patient instructions and summary information was reviewed with the patient as documented in the AVS. This note was prepared with assistance of Dragon voice recognition software. Occasional wrong-word or sound-a-like substitutions may have occurred due to the inherent limitations of voice recognition software  This visit occurred during the SARS-CoV-2 public health emergency.  Safety protocols were in place, including screening questions prior to the visit, additional usage of staff PPE, and extensive cleaning of exam room while observing appropriate contact time as indicated for disinfecting solutions.

## 2020-04-05 NOTE — Patient Instructions (Addendum)
Please return in 6 months for hypertension follow up.  We will call you to get you set up for an ultrasound of your heart and a carotic vascular study due to your symptoms of arm weakness. IF you have persistent weakness, blurred vision, slurred speech or other symptoms of a stroke, call [phone] Start a baby aspirin once a day.   Call Dr. Garth Schlatter office to discuss your swallowing concerns.   If you have any questions or concerns, please don't hesitate to send me a message via MyChart or call the office at [phone]. Thank you for visiting with [place] today! It's our pleasure caring for you.

## 2020-04-07 ENCOUNTER — Ambulatory Visit (INDEPENDENT_AMBULATORY_CARE_PROVIDER_SITE_OTHER): Payer: Medicare Other | Admitting: Family Medicine

## 2020-04-07 ENCOUNTER — Encounter: Payer: Self-pay | Admitting: Family Medicine

## 2020-04-07 ENCOUNTER — Other Ambulatory Visit: Payer: Self-pay

## 2020-04-07 ENCOUNTER — Ambulatory Visit (INDEPENDENT_AMBULATORY_CARE_PROVIDER_SITE_OTHER): Payer: Medicare Other

## 2020-04-07 VITALS — BP 126/62 | HR 74 | Ht 65.0 in | Wt 222.0 lb

## 2020-04-07 DIAGNOSIS — M170 Bilateral primary osteoarthritis of knee: Secondary | ICD-10-CM | POA: Diagnosis not present

## 2020-04-07 DIAGNOSIS — M25561 Pain in right knee: Secondary | ICD-10-CM | POA: Diagnosis not present

## 2020-04-07 DIAGNOSIS — G8929 Other chronic pain: Secondary | ICD-10-CM

## 2020-04-07 DIAGNOSIS — M25562 Pain in left knee: Secondary | ICD-10-CM

## 2020-04-07 NOTE — Patient Instructions (Addendum)
Xray today Voltaren gel over the counter Tart cherry extract 1200 mg at night Ice 20 mins 2 times a day See me again in 6-8 weeks

## 2020-04-07 NOTE — Progress Notes (Signed)
[facility] [address] [place] Phone: [phone] Subjective:    I'm seeing this patient by the request  of:  [name], MD  CC: bilateral knee pain   [id number]  Jessica [name] is a 69 y.o. female coming in with complaint of Bilateral knee pain  Patient states that she was trying to lose weight to help with the knee pain. Locates pain mostly in R knee that is around the patella and back of knee. Both knees are really weak. Does use OTC gel and advil if it gets too bad. Pain is worse if standing or walking too long and tries to never lock her knees. Patient rates pain a 6-7/10 Patient was approved for bilateral monovisc injections.  Patient has had difficulty focusing on herself secondary to her husband having been treated for prostate cancer.       Past Medical History:  Diagnosis Date  . Anxiety   . Arthritis    bilateral knees  . Asthma    childhood  . Autonomic neuropathy    diabetic  . Cataract   . Celiac disease   . Constipation   . Depression   . Diabetes mellitus    type II, Hemoglobin A1C 9.9 10/05/2011  . Diabetic neuropathy ([place])   . Diverticulosis   . Fatty liver   . Gastroparesis   . GERD (gastroesophageal reflux disease)   . Hyperlipidemia   . Hypertension   . IBS (irritable bowel syndrome)   . Kidney stones   . Personal history of colonic polyps 03/2010   hyperplastic  . Shingles 2021   Past Surgical History:  Procedure Laterality Date  . APPENDECTOMY    . CATARACT EXTRACTION Right 04/29/2018  . CATARACT EXTRACTION Left 03/2018  . COLONOSCOPY    . DILATATION & CURRETTAGE/HYSTEROSCOPY WITH RESECTOCOPE N/A 03/24/2013   Procedure: DILATATION & CURETTAGE, HYSTEROSCOPY WITH RESECTION;  Surgeon: Olga Millers, MD;  Location: [facility];  Service: Gynecology;  Laterality: N/A;  . HYSTEROSCOPY WITH D & C N/A 11/08/2015   Procedure: DILATATION AND CURETTAGE /HYSTEROSCOPY;  Surgeon: Olga Millers,  MD;  Location: [facility];  Service: Gynecology;  Laterality: N/A;  . left breast cyst removal  2000  . LEFT HEART CATH AND CORONARY ANGIOGRAPHY N/A 10/31/2018   Procedure: LEFT HEART CATH AND CORONARY ANGIOGRAPHY;  Surgeon: Jettie Booze, MD;  Location: [facility];  Service: Cardiovascular;  Laterality: N/A;  . TUBAL LIGATION     Social History   Socioeconomic History  . Marital status: Married    Spouse name: Devar Domino  . Number of children: 2  . Years of education: Not on file  . Highest education level: Not on file  Occupational History  . 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 Occupation: retired  Tobacco Use  . Smoking status: Never Smoker  . Smokeless tobacco: Never Used  Vaping Use  . Vaping Use: Never used  Substance and Sexual Activity  . Alcohol use: No  . Drug use: No  . Sexual activity: Yes    Birth control/protection: Post-menopausal, Surgical  Other Topics Concern  . Not on file  Social History Narrative  . Not on file   Social Determinants of Health   Financial Resource Strain: Low Risk   . Difficulty of Paying Living Expenses: Not hard at all  Food Insecurity: No Food Insecurity  . Worried About  Running Out of Food in the Last Year: Never true  . Ran Out of Food in the Last Year: Never true  Transportation Needs: No Transportation Needs  . Lack of Transportation (Medical): No  . Lack of Transportation (Non-Medical): No  Physical Activity: Insufficiently Active  . Days of Exercise per Week: 2 days  . Minutes of Exercise per Session: 60 min  Stress: Stress Concern Present  . Feeling of Stress : To some extent  Social Connections: Moderately Integrated  . Frequency of Communication with Friends and Family: More than three times a week  . Frequency of Social Gatherings with Friends and Family: Never  . Attends Religious Services: More than 4 times per year  . Active Member of Clubs or  Organizations: No  . Attends Archivist Meetings: Never  . Marital Status: Married   Allergies  Allergen Reactions  . Gluten Meal Other (See Comments)    No wheat or soy  . Nsaids Nausea And Vomiting and Other (See Comments)    Cannot tolerate large doses   . Soy Allergy   . Wheat Bran    Family History  Problem Relation Age of Onset  . Hypertension Mother   . Colon cancer Sister        dx in her early 53s  . Diabetes Sister   . Endometrial cancer Sister   . Hypertension Brother   . Other Father        2 collapsed lungs  . COPD Father   . Diabetes Father   . Heart attack Father   . Heart failure Father   . High blood pressure Father   . Colon polyps Neg Hx   . Esophageal cancer Neg Hx   . Stomach cancer Neg Hx     Current Outpatient Medications (Endocrine & Metabolic):  .  insulin NPH-regular Human (HUMULIN 70/30) (70-30) 100 UNIT/ML injection, 38 UNITS WITH BREAKFAST, AND 10 UNITS WITH SUPPER.  Current Outpatient Medications (Cardiovascular):  .  atorvastatin (LIPITOR) 40 MG tablet, TAKE 1 TABLET BY MOUTH EVERY DAY .  furosemide (LASIX) 20 MG tablet, Take 1 tablet (20 mg total) by mouth daily. Marland Kitchen  olmesartan (BENICAR) 40 MG tablet, Take 1 tablet (40 mg total) by mouth daily.  Current Outpatient Medications (Respiratory):  .  albuterol (VENTOLIN HFA) 108 (90 Base) MCG/ACT inhaler, Inhale 2 puffs into the lungs every 6 (six) hours as needed for wheezing or shortness of breath. .  fluticasone (FLONASE) 50 MCG/ACT nasal spray, Place 1 spray into both nostrils daily.  Current Outpatient Medications (Analgesics):  .  ibuprofen (ADVIL) 200 MG tablet, Take 200 mg by mouth daily. (Patient not taking: Reported on 04/07/2020)   Current Outpatient Medications (Other):  .  BD INSULIN SYRINGE U/F 31G X 5/16" 1 ML MISC, USE UP TO 3 TIMES A DAY .  dexlansoprazole (DEXILANT) 60 MG capsule, Take 1 capsule (60 mg total) by mouth daily. Marland Kitchen  gabapentin (NEURONTIN) 600 MG  tablet, TAKE 1 TABLET IN THE MORNING AND 2 TABLETS AT BEDTIME .  ketoconazole (NIZORAL) 2 % cream, Apply 1 application topically 2 (two) times daily as needed for irritation.  Glory Rosebush ULTRA test strip, USE 2 TIMES DAILY. AND LANCETS 2/DAY .  polyethylene glycol powder ([employer] PURELAX) 17 GM/SCOOP powder, DISSOLVE 1 CAPFUL IN AT LEAST 8 OUNCES WATER/JUICE AND DRINK TWICE DAILY .  valACYclovir (VALTREX) 500 MG tablet, Take 500 mg by mouth daily. .  Vitamin D, Ergocalciferol, (DRISDOL) 1.25 MG (50000 UNIT)  CAPS capsule, Take 1 capsule (50,000 Units total) by mouth every 7 (seven) days.   Reviewed prior external information including notes and imaging from  primary care provider As well as notes that were available from care everywhere and other healthcare systems.  Past medical history, social, surgical and family history all reviewed in electronic medical record.  No pertanent information unless stated regarding to the chief complaint.   Review of Systems:  No headache, visual changes, nausea, vomiting, diarrhea, constipation, dizziness, abdominal pain, skin rash, fevers, chills, night sweats, weight loss, swollen lymph nodes, body aches,, chest pain, shortness of breath, mood changes. POSITIVE muscle aches, joint swelling  Objective  Blood pressure 126/62, pulse 74, height 5' 5"  (1.651 m), weight 222 lb (100.7 kg), SpO2 98 %.   General: No apparent distress alert and oriented x3 mood and affect normal, dressed appropriately.  HEENT: Pupils equal, extraocular movements intact  Respiratory: Patient's speak in full sentences and does not appear short of breath  Severely antalgic No significant swelling of the lower extremities. Knee: Bilateral valgus deformity noted. Large thigh to calf ratio.  Tender to palpation over medial and PF joint line.  ROM full in flexion and extension and lower leg rotation. instability with valgus force.  painful patellar compression. Patellar glide with  moderate crepitus. Patellar and quadriceps tendons unremarkable. Hamstring and quadriceps strength is normal.  After informed written and verbal consent, patient was seated on exam table. Right knee was prepped with alcohol swab and utilizing anterolateral approach, patient's right knee space was injected with 48 mg per 3 mL of Monovisc (sodium hyaluronate) in a prefilled syringe was injected easily into the knee through a 22-gauge needle..Patient tolerated the procedure well without immediate complications.  After informed written and verbal consent, patient was seated on exam table. Left knee was prepped with alcohol swab and utilizing anterolateral approach, patient's left knee space was injected with 40 mg per 3 mL of Monovisc (sodium hyaluronate) in a prefilled syringe was injected easily into the knee through a 22-gauge needle..Patient tolerated the procedure well without immediate complications.    Impression and Recommendations:     The above documentation has been reviewed and is accurate and complete Lyndal Pulley, DO

## 2020-04-07 NOTE — Assessment & Plan Note (Signed)
Patient has done remarkably well with losing weight.  Patient's last viscosupplementation was 4 years ago.  Encouraged with this and hopefully patient will have another good response.  Patient does have social determinants of health including being the primary caregiver for her husband who is being treated for prostate cancer.  Discussed new x-rays to further evaluate the progression of arthritis.  Discussed encourage patient to continue with the weight management.  Patient will follow up with me again in 6 weeks.  Patient wants to avoid steroid injections in until patient's husband's health is in a different area where she is not the primary caregiver she would not be able to do the surgical intervention.

## 2020-04-12 ENCOUNTER — Ambulatory Visit
Admission: RE | Admit: 2020-04-12 | Discharge: 2020-04-12 | Disposition: A | Discharge status: Home / Self Care | Source: Ambulatory Visit | Attending: Family Medicine | Admitting: Family Medicine

## 2020-04-12 ENCOUNTER — Encounter (INDEPENDENT_AMBULATORY_CARE_PROVIDER_SITE_OTHER): Payer: Self-pay | Admitting: Bariatrics

## 2020-04-12 DIAGNOSIS — R29898 Other symptoms and signs involving the musculoskeletal system: Secondary | ICD-10-CM

## 2020-04-13 ENCOUNTER — Other Ambulatory Visit: Payer: Self-pay | Admitting: Family Medicine

## 2020-04-14 ENCOUNTER — Encounter: Payer: Self-pay | Admitting: Family Medicine

## 2020-04-15 NOTE — Progress Notes (Signed)
Please call patient: I have reviewed his/her lab results. The ultrasound of the arteries in her neck show some vascular disease on the right but mildly so; the left is normal. This study is negative. We do not need anything further based on this result. Thanks.

## 2020-04-21 NOTE — Telephone Encounter (Signed)
Pt notified. See imaging result notes.

## 2020-04-26 ENCOUNTER — Encounter: Payer: Self-pay | Admitting: *Deleted

## 2020-04-30 ENCOUNTER — Other Ambulatory Visit (INDEPENDENT_AMBULATORY_CARE_PROVIDER_SITE_OTHER): Payer: Self-pay | Admitting: Bariatrics

## 2020-04-30 DIAGNOSIS — E559 Vitamin D deficiency, unspecified: Secondary | ICD-10-CM

## 2020-05-04 ENCOUNTER — Telehealth (INDEPENDENT_AMBULATORY_CARE_PROVIDER_SITE_OTHER): Payer: 59 | Admitting: Bariatrics

## 2020-05-04 DIAGNOSIS — F3289 Other specified depressive episodes: Secondary | ICD-10-CM

## 2020-05-04 DIAGNOSIS — K5909 Other constipation: Secondary | ICD-10-CM

## 2020-05-04 DIAGNOSIS — E1143 Type 2 diabetes mellitus with diabetic autonomic (poly)neuropathy: Secondary | ICD-10-CM | POA: Diagnosis not present

## 2020-05-04 DIAGNOSIS — E559 Vitamin D deficiency, unspecified: Secondary | ICD-10-CM | POA: Diagnosis not present

## 2020-05-04 DIAGNOSIS — Z794 Long term (current) use of insulin: Secondary | ICD-10-CM

## 2020-05-04 DIAGNOSIS — Z6836 Body mass index (BMI) 36.0-36.9, adult: Secondary | ICD-10-CM

## 2020-05-04 MED ORDER — VITAMIN D (ERGOCALCIFEROL) 1.25 MG (50000 UNIT) PO CAPS: 50000.0000 [IU] | ORAL_CAPSULE | ORAL | 0 refills | Status: DC

## 2020-05-05 ENCOUNTER — Encounter (INDEPENDENT_AMBULATORY_CARE_PROVIDER_SITE_OTHER): Payer: Self-pay | Admitting: Bariatrics

## 2020-05-05 NOTE — Progress Notes (Signed)
TeleHealth Visit:  Due to the COVID-19 pandemic, this visit was completed with telemedicine (audio/video) technology to reduce patient and provider exposure as well as to preserve personal protective equipment.   Jessica [name] has verbally consented to this TeleHealth visit. The patient is located at home, the provider is located at the [facility] office. The participants in this visit include the listed provider and patient. The visit was conducted today via telephone after attempting video visit (unable to do video visit due to system being down).  Shakema was unable to use realtime audiovisual technology today and the telehealth visit was conducted via telephone.  Chief Complaint: OBESITY Caroleann is here to discuss her progress with her obesity treatment plan along with follow-up of her obesity related diagnoses. Lucinda is on the Category 3 Plan and states she is following her eating plan approximately 80% of the time. Zoi states she is using the elliptical for 60 minutes 7 times per week.  Today's visit was #: 15 Starting weight: 246 lbs Starting date: 03/12/2019  Interim History: Ashna thinks that she has lost 1 pound since her last office visit.  Subjective:   1. Type 2 diabetes mellitus with diabetic autonomic neuropathy, with long-term current use of insulin ([place]) Caliya endorses polyphagia.  Low FBS at home 57, high 200.  Lab Results  Component Value Date   HGBA1C 7.1 (A) 12/31/2019   HGBA1C 6.9 (A) 10/28/2019   HGBA1C 6.7 (H) 07/24/2019   Lab Results  Component Value Date   MICROALBUR 0.7 10/25/2017   LDLCALC 74 03/11/2020   CREATININE 0.96 03/11/2020   2. Vitamin D deficiency Rin's Vitamin D level was 61.6 on 03/11/2020. She is currently taking prescription vitamin D 50,000 IU each week. She denies nausea, vomiting or muscle weakness.  3. Constipation Takes MiraLAX at night.  She has had kidney stones x1.      Assessment/Plan:   1. Type 2  diabetes mellitus with diabetic autonomic neuropathy, with long-term current use of insulin (HCC) Good blood sugar control is important to decrease the likelihood of diabetic complications such as nephropathy, neuropathy, limb loss, blindness, coronary artery disease, and death. Intensive lifestyle modification including diet, exercise and weight loss are the first line of treatment for diabetes.  Continue medications.  We reviewed hypoglycemia strategies (has glucose tablets).   2. Vitamin D deficiency Low Vitamin D level contributes to fatigue and are associated with obesity, breast, and colon cancer. She agrees to continue to take prescription Vitamin D @50 ,000 IU every week and will follow-up for routine testing of Vitamin D, at least 2-3 times per year to avoid over-replacement.  -Refill Vitamin D, Ergocalciferol, (DRISDOL) 1.25 MG (50000 UNIT) CAPS capsule; Take 1 capsule (50,000 Units total) by mouth every 7 (seven) days.  Dispense: 4 capsule; Refill: 0  3. Constipation Take Fiber-Con or fiber supplement with a full glass of water.  4. Class 2 severe obesity with serious comorbidity and body mass index (BMI) of 36.0 to 36.9 in adult, unspecified obesity type [facility])  Averil is currently in the action stage of change. As such, her goal is to continue with weight loss efforts. She has agreed to the Category 3 Plan.   She will work on meal planning, intentional eating, and will be more adherent to the plan.  Exercise goals: Will continue to use the elliptical.  Behavioral modification strategies: increasing lean protein intake, decreasing simple carbohydrates, increasing vegetables, increasing water intake, decreasing eating out, no skipping meals, meal planning  and cooking strategies, keeping healthy foods in the home and planning for success.  Andra has agreed to follow-up with our clinic in 2-3 weeks. She was informed of the importance of frequent follow-up visits to maximize her success  with intensive lifestyle modifications for her multiple health conditions.  Objective:   VITALS: Per patient if applicable, see vitals. GENERAL: Alert and in no acute distress. CARDIOPULMONARY: No increased WOB. Speaking in clear sentences.  PSYCH: Pleasant and cooperative. Speech normal rate and rhythm. Affect is appropriate. Insight and judgement are appropriate. Attention is focused, linear, and appropriate.  NEURO: Oriented as arrived to appointment on time with no prompting.   Lab Results  Component Value Date   CREATININE 0.96 03/11/2020   BUN 19 03/11/2020   NA 141 03/11/2020   K 4.8 03/11/2020   CL 102 03/11/2020   CO2 26 03/11/2020   Lab Results  Component Value Date   ALT 14 03/11/2020   AST 15 03/11/2020   ALKPHOS 93 03/11/2020   BILITOT 0.4 03/11/2020   Lab Results  Component Value Date   HGBA1C 7.1 (A) 12/31/2019   HGBA1C 6.9 (A) 10/28/2019   HGBA1C 6.7 (H) 07/24/2019   HGBA1C 7.2 (A) 02/20/2019   HGBA1C 6.9 (A) 11/20/2018   Lab Results  Component Value Date   TSH 2.15 10/16/2018   Lab Results  Component Value Date   CHOL 145 03/11/2020   HDL 58 03/11/2020   LDLCALC 74 03/11/2020   LDLDIRECT 143.2 11/23/2006   TRIG 62 03/11/2020   CHOLHDL 3 10/16/2018   Lab Results  Component Value Date   WBC 4.8 10/28/2018   HGB 13.4 10/28/2018   HCT 40.4 10/28/2018   MCV 97 10/28/2018   PLT 260 10/28/2018   Lab Results  Component Value Date   IRON 74 10/25/2017   TIBC 322 04/04/2012   Attestation Statements:   Reviewed by clinician on day of visit: allergies, medications, problem list, medical history, surgical history, family history, social history, and previous encounter notes.  Time spent on visit including pre-visit chart review and post-visit charting and care was 20 minutes.   I, Water quality scientist, CMA, am acting as Location manager for [employer], DO  I have reviewed the above documentation for accuracy and completeness, and I agree with the above.  [name], DO

## 2020-05-06 ENCOUNTER — Other Ambulatory Visit: Payer: Self-pay

## 2020-05-06 ENCOUNTER — Ambulatory Visit (HOSPITAL_COMMUNITY): Payer: Medicare Other | Attending: Internal Medicine

## 2020-05-06 DIAGNOSIS — R29898 Other symptoms and signs involving the musculoskeletal system: Secondary | ICD-10-CM

## 2020-05-06 DIAGNOSIS — I10 Essential (primary) hypertension: Secondary | ICD-10-CM | POA: Diagnosis present

## 2020-05-06 LAB — ECHOCARDIOGRAM COMPLETE
Area-P 1/2: 3.03 cm2
S' Lateral: 2.3 cm

## 2020-05-13 ENCOUNTER — Ambulatory Visit: Payer: Medicare Other | Admitting: Endocrinology

## 2020-05-14 ENCOUNTER — Encounter: Payer: Self-pay | Admitting: Endocrinology

## 2020-05-14 ENCOUNTER — Ambulatory Visit (INDEPENDENT_AMBULATORY_CARE_PROVIDER_SITE_OTHER): Payer: Medicare Other | Admitting: Endocrinology

## 2020-05-14 ENCOUNTER — Other Ambulatory Visit: Payer: Self-pay

## 2020-05-14 VITALS — BP 132/86 | HR 76 | Ht 65.0 in | Wt 224.0 lb

## 2020-05-14 DIAGNOSIS — E118 Type 2 diabetes mellitus with unspecified complications: Secondary | ICD-10-CM

## 2020-05-14 DIAGNOSIS — E1143 Type 2 diabetes mellitus with diabetic autonomic (poly)neuropathy: Secondary | ICD-10-CM

## 2020-05-14 DIAGNOSIS — Z794 Long term (current) use of insulin: Secondary | ICD-10-CM

## 2020-05-14 LAB — POCT GLYCOSYLATED HEMOGLOBIN (HGB A1C): Hemoglobin A1C: 7.8 % — AB (ref 4.0–5.6)

## 2020-05-14 NOTE — Progress Notes (Signed)
Subjective:    Patient ID: Jessica [name], female    DOB: 05-Dec-1950, 70 y.o.   MRN: [id number]  HPI Pt returns for f/u of diabetes mellitus:  DM type: Insulin-requiring type 2. Dx'ed: 2000.  Complications: PN and AN.   Therapy: insulin since 2005 GDM: never DKA: never Severe hypoglycemia: never.  Pancreatitis: never.  SDOH: she takes human insulin, due to cost; her ability to care for her DM is compromised by her husband's illness.   Other: she has done better on a BID insulin regimen.   Interval history: pt states cbg's vary from 51-250.  There is no trend throughout the day.  pt states she feels well in general.  She has hypoglycemia approx 1-2 times per month, and these episodes are mild.  This happens after she misses lunch.   Pt also has MNG (dx'ed 2020; [place] in 2021 was unchanged; RLP nodule needs surveillance).   Past Medical History:  Diagnosis Date  . Anxiety   . Arthritis    bilateral knees  . Asthma    childhood  . Autonomic neuropathy    diabetic  . Cataract   . Celiac disease   . Constipation   . Depression   . Diabetes mellitus    type II, Hemoglobin A1C 9.9 10/05/2011  . Diabetic neuropathy ([place])   . Diverticulosis   . Fatty liver   . Gastroparesis   . GERD (gastroesophageal reflux disease)   . Hyperlipidemia   . Hypertension   . IBS (irritable bowel syndrome)   . Kidney stones   . Personal history of colonic polyps 03/2010   hyperplastic  . Shingles 2021    Past Surgical History:  Procedure Laterality Date  . APPENDECTOMY    . CATARACT EXTRACTION Right 04/29/2018  . CATARACT EXTRACTION Left 03/2018  . COLONOSCOPY    . DILATATION & CURRETTAGE/HYSTEROSCOPY WITH RESECTOCOPE N/A 03/24/2013   Procedure: DILATATION & CURETTAGE, HYSTEROSCOPY WITH RESECTION;  Surgeon: Olga Millers, MD;  Location: [facility];  Service: Gynecology;  Laterality: N/A;  . HYSTEROSCOPY WITH D & C N/A 11/08/2015   Procedure: DILATATION AND CURETTAGE /HYSTEROSCOPY;  Surgeon: Olga Millers, MD;  Location: [facility];  Service: Gynecology;  Laterality: N/A;  . left breast cyst removal  2000  . LEFT HEART CATH AND CORONARY ANGIOGRAPHY N/A 10/31/2018   Procedure: LEFT HEART CATH AND CORONARY ANGIOGRAPHY;  Surgeon: Jettie Booze, MD;  Location: [facility];  Service: Cardiovascular;  Laterality: N/A;  . TUBAL LIGATION      Social History   Socioeconomic History  . Marital status: Married    Spouse name: Devar Barrales  . Number of children: 2  . Years of education: Not on file  . Highest education level: Not on file  Occupational History  . 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 Occupation: retired  Tobacco Use  . Smoking status: Never Smoker  . Smokeless tobacco: Never Used  Vaping Use  . Vaping Use: Never used  Substance and Sexual Activity  . Alcohol use: No  . Drug use: No  . Sexual activity: Yes    Birth control/protection: Post-menopausal, Surgical  Other Topics Concern  . Not on file  Social History Narrative  . Not on file   Social Determinants of Health   Financial Resource Strain: Low Risk   . Difficulty of Paying Living Expenses: Not hard at all  Food Insecurity: No Food  Insecurity  . Worried About Charity fundraiser in the Last Year: Never true  . Ran Out of Food in the Last Year: Never true  Transportation Needs: No Transportation Needs  . Lack of Transportation (Medical): No  . Lack of Transportation (Non-Medical): No  Physical Activity: Insufficiently Active  . Days of Exercise per Week: 2 days  . Minutes of Exercise per Session: 60 min  Stress: Stress Concern Present  . Feeling of Stress : To some extent  Social Connections: Moderately Integrated  . Frequency of Communication with Friends and Family: More than three times a week  . Frequency of Social Gatherings with Friends and Family: Never  . Attends Religious Services: More than 4 times per year  . Active Member  of Clubs or Organizations: No  . Attends Archivist Meetings: Never  . Marital Status: Married  Human resources officer Violence: Not At Risk  . Fear of Current or Ex-Partner: No  . Emotionally Abused: No  . Physically Abused: No  . Sexually Abused: No    Current Outpatient Medications on File Prior to Visit  Medication Sig Dispense Refill  . albuterol (VENTOLIN HFA) 108 (90 Base) MCG/ACT inhaler Inhale 2 puffs into the lungs every 6 (six) hours as needed for wheezing or shortness of breath. 8 g 3  . atorvastatin (LIPITOR) 40 MG tablet TAKE 1 TABLET BY MOUTH EVERY DAY 90 tablet 3  . BD INSULIN SYRINGE U/F 31G X 5/16" 1 ML MISC USE UP TO 3 TIMES A DAY 270 each 3  . dexlansoprazole (DEXILANT) 60 MG capsule Take 1 capsule (60 mg total) by mouth daily. 90 capsule 4  . fluticasone (FLONASE) 50 MCG/ACT nasal spray Place 1 spray into both nostrils daily. 16 g 6  . furosemide (LASIX) 20 MG tablet Take 1 tablet (20 mg total) by mouth daily.    Marland Kitchen gabapentin (NEURONTIN) 600 MG tablet TAKE 1 TABLET IN THE MORNING AND 2 TABLETS AT BEDTIME 270 tablet 2  . ibuprofen (ADVIL) 200 MG tablet Take 200 mg by mouth daily.    . insulin NPH-regular Human (HUMULIN 70/30) (70-30) 100 UNIT/ML injection 38 UNITS WITH BREAKFAST, AND 10 UNITS WITH SUPPER. 60 mL 3  . ketoconazole (NIZORAL) 2 % cream APPLY TWICE A DAY FOR 2 WEEKS AS NEEDED FOR RASH 30 g 2  . olmesartan (BENICAR) 40 MG tablet Take 1 tablet (40 mg total) by mouth daily. 90 tablet 3  . ONETOUCH ULTRA test strip USE 2 TIMES DAILY. AND LANCETS 2/DAY 200 strip 3  . polyethylene glycol powder ([employer] PURELAX) 17 GM/SCOOP powder DISSOLVE 1 CAPFUL IN AT LEAST 8 OUNCES WATER/JUICE AND DRINK TWICE DAILY 1020 g 11  . valACYclovir (VALTREX) 500 MG tablet Take 500 mg by mouth daily.    . Vitamin D, Ergocalciferol, (DRISDOL) 1.25 MG (50000 UNIT) CAPS capsule Take 1 capsule (50,000 Units total) by mouth every 7 (seven) days. 4 capsule 0   No current  facility-administered medications on file prior to visit.    Allergies  Allergen Reactions  . Gluten Meal Other (See Comments)    No wheat or soy  . Nsaids Nausea And Vomiting and Other (See Comments)    Cannot tolerate large doses   . Soy Allergy   . Wheat Bran     Family History  Problem Relation Age of Onset  . Hypertension Mother   . Colon cancer Sister        dx in her early 82s  . Diabetes  Sister   . Endometrial cancer Sister   . Hypertension Brother   . Other Father        2 collapsed lungs  . COPD Father   . Diabetes Father   . Heart attack Father   . Heart failure Father   . High blood pressure Father   . Colon polyps Neg Hx   . Esophageal cancer Neg Hx   . Stomach cancer Neg Hx     BP 132/86   Pulse 76   Ht 5' 5"  (1.651 m)   Wt 224 lb (101.6 kg)   SpO2 98%   BMI 37.28 kg/m    Review of Systems Denies LOC.      Objective:   Physical Exam VITAL SIGNS:  See vs page GENERAL: no distress Pulses: dorsalis pedis intact bilat.   MSK: no deformity of the feet CV: no leg edema Skin:  no ulcer on the feet.  normal color and temp on the feet.  Neuro: sensation is intact to touch on the feet.   A1c=7.8%  Lab Results  Component Value Date   CREATININE 0.96 03/11/2020   BUN 19 03/11/2020   NA 141 03/11/2020   K 4.8 03/11/2020   CL 102 03/11/2020   CO2 26 03/11/2020       Assessment & Plan:  Insulin-requiring type 2 DM, with AN Hypoglycemia, due to insulin: this limits aggressiveness of glycemic control   Patient Instructions  Please continue the same insulin.   check your blood sugar twice a day.  vary the time of day when you check, between before the 3 meals, and at bedtime.  also check if you have symptoms of your blood sugar being too high or too low.  please keep a record of the readings and bring it to your next appointment here (or you can bring the meter itself).  You can write it on any piece of paper.  please call us sooner if your blood  sugar goes below 70, or if you have a lot of readings over 200.  Please come back for a follow-up appointment in 3 months.

## 2020-05-14 NOTE — Patient Instructions (Addendum)
Please continue the same insulin.   check your blood sugar twice a day.  vary the time of day when you check, between before the 3 meals, and at bedtime.  also check if you have symptoms of your blood sugar being too high or too low.  please keep a record of the readings and bring it to your next appointment here (or you can bring the meter itself).  You can write it on any piece of paper.  please call us sooner if your blood sugar goes below 70, or if you have a lot of readings over 200.  Please come back for a follow-up appointment in 3 months.

## 2020-05-21 ENCOUNTER — Telehealth: Payer: Self-pay | Admitting: Internal Medicine

## 2020-05-21 MED ORDER — SUCRALFATE 1 GM/10ML PO SUSP
1.0000 g | Freq: Four times a day (QID) | ORAL | 1 refills | Status: DC
Start: 2020-05-21 — End: 2022-10-09

## 2020-05-21 NOTE — Telephone Encounter (Signed)
Okay to refill Carafate 1 g 3 times daily before meals and at bedtime as needed x1 month

## 2020-05-21 NOTE — Telephone Encounter (Signed)
Spoke with pt and she states she is having nausea again like she did over a year ago. Reports it has been going on for about a month. Pt is requesting a refill on the carafate she was given in the past. Please advise.

## 2020-05-21 NOTE — Telephone Encounter (Signed)
Pt aware and script sent to pharmacy.

## 2020-05-21 NOTE — Telephone Encounter (Signed)
Patient called states she is getting a lot of nausea after every meal again and she is seeking a refill on previous medication but does not remember which one it was

## 2020-05-23 ENCOUNTER — Other Ambulatory Visit (INDEPENDENT_AMBULATORY_CARE_PROVIDER_SITE_OTHER): Payer: Self-pay | Admitting: Bariatrics

## 2020-05-23 DIAGNOSIS — E559 Vitamin D deficiency, unspecified: Secondary | ICD-10-CM

## 2020-05-26 ENCOUNTER — Other Ambulatory Visit: Payer: Self-pay | Admitting: Obstetrics and Gynecology

## 2020-05-26 ENCOUNTER — Ambulatory Visit: Payer: Medicare Other | Admitting: Family Medicine

## 2020-05-26 DIAGNOSIS — R9389 Abnormal findings on diagnostic imaging of other specified body structures: Secondary | ICD-10-CM

## 2020-05-27 ENCOUNTER — Other Ambulatory Visit: Payer: Self-pay | Admitting: Endocrinology

## 2020-05-27 DIAGNOSIS — E1143 Type 2 diabetes mellitus with diabetic autonomic (poly)neuropathy: Secondary | ICD-10-CM

## 2020-05-27 DIAGNOSIS — Z794 Long term (current) use of insulin: Secondary | ICD-10-CM

## 2020-05-28 ENCOUNTER — Telehealth: Payer: Self-pay | Admitting: *Deleted

## 2020-05-28 ENCOUNTER — Other Ambulatory Visit: Payer: Self-pay | Admitting: *Deleted

## 2020-05-28 MED ORDER — INSULIN NPH ISOPHANE & REGULAR (70-30) 100 UNIT/ML ~~LOC~~ SUSP: SUBCUTANEOUS | 3 refills | Status: DC

## 2020-05-28 NOTE — Telephone Encounter (Signed)
Called pt stated---medication declined by insurance but when pharmacy called another [facility]--which Humulin approved by the insurance.

## 2020-05-28 NOTE — Telephone Encounter (Signed)
Please advise 

## 2020-05-28 NOTE — Telephone Encounter (Signed)
novolin 70/30 is fine

## 2020-05-31 ENCOUNTER — Other Ambulatory Visit: Payer: Self-pay

## 2020-05-31 ENCOUNTER — Encounter (INDEPENDENT_AMBULATORY_CARE_PROVIDER_SITE_OTHER): Payer: Self-pay | Admitting: Bariatrics

## 2020-05-31 ENCOUNTER — Ambulatory Visit (INDEPENDENT_AMBULATORY_CARE_PROVIDER_SITE_OTHER): Payer: Medicare Other | Admitting: Bariatrics

## 2020-05-31 VITALS — BP 148/86 | HR 69 | Temp 97.6°F | Ht 65.0 in | Wt 217.0 lb

## 2020-05-31 DIAGNOSIS — Z6836 Body mass index (BMI) 36.0-36.9, adult: Secondary | ICD-10-CM

## 2020-05-31 DIAGNOSIS — E7849 Other hyperlipidemia: Secondary | ICD-10-CM

## 2020-05-31 DIAGNOSIS — E559 Vitamin D deficiency, unspecified: Secondary | ICD-10-CM

## 2020-05-31 MED ORDER — VITAMIN D (ERGOCALCIFEROL) 1.25 MG (50000 UNIT) PO CAPS: 50000.0000 [IU] | ORAL_CAPSULE | ORAL | 0 refills | Status: DC

## 2020-05-31 NOTE — Progress Notes (Signed)
Chief Complaint:   OBESITY Jessica [name] is here to discuss her progress with her obesity treatment plan along with follow-up of her obesity related diagnoses. Jessica [name] is on the Category 3 Plan and states she is following her eating plan approximately 95% of the time. Jessica [name] states she is doing [place] for 60 minutes 7 times per week.  Today's visit was #: 21 Starting weight: 246 lbs Starting date: 03/12/2019 Today's weight: 217 lbs Today's date: 05/31/2020 Total lbs lost to date: 29 lbs Total lbs lost since last in-office visit: 3 lbs  Interim History: Jessica [name] is down an additional 3 pounds since her last visit.  She is doing better with her water.  Subjective:   1. Other hyperlipidemia Jessica [name] has hyperlipidemia and has been trying to improve her cholesterol levels with intensive lifestyle modification including a low saturated fat diet, exercise and weight loss. She denies any chest pain, claudication or myalgias.  She is taking Lipitor.  Lab Results  Component Value Date   ALT 14 03/11/2020   AST 15 03/11/2020   ALKPHOS 93 03/11/2020   BILITOT 0.4 03/11/2020   Lab Results  Component Value Date   CHOL 145 03/11/2020   HDL 58 03/11/2020   LDLCALC 74 03/11/2020   LDLDIRECT 143.2 11/23/2006   TRIG 62 03/11/2020   CHOLHDL 3 10/16/2018   2. Vitamin D deficiency Jessica [name]'s Vitamin D level was 61.6 on 03/11/2020. She is currently taking prescription vitamin D 50,000 IU each week. She denies nausea, vomiting or muscle weakness.  She is taking her vitamin D as directed.  Assessment/Plan:   1. Other hyperlipidemia Cardiovascular risk and specific lipid/LDL goals reviewed.  We discussed several lifestyle modifications today and Shadow will continue to work on diet, exercise and weight loss efforts. Orders and follow up as documented in patient record.  Continue to work on diet and exercise.  Counseling Intensive lifestyle modifications are the first line treatment for this  issue. . Dietary changes: Increase soluble fiber. Decrease simple carbohydrates. . Exercise changes: Moderate to vigorous-intensity aerobic activity 150 minutes per week if tolerated. . Lipid-lowering medications: see documented in medical record.  2. Vitamin D deficiency Low Vitamin D level contributes to fatigue and are associated with obesity, breast, and colon cancer. She agrees to continue to take prescription Vitamin D @50 ,000 IU every week and will follow-up for routine testing of Vitamin D, at least 2-3 times per year to avoid over-replacement.  -Refill Vitamin D, Ergocalciferol, (DRISDOL) 1.25 MG (50000 UNIT) CAPS capsule; Take 1 capsule (50,000 Units total) by mouth every 7 (seven) days.  Dispense: 4 capsule; Refill: 0  3. Class 2 severe obesity with serious comorbidity and body mass index (BMI) of 36.0 to 36.9 in adult, unspecified obesity type [name])  Jessica [name] is currently in the action stage of change. As such, her goal is to continue with weight loss efforts. She has agreed to the Category 3 Plan.   She will work on meal planning and will remain strongly adherent to the plan.  Exercise goals: Cubii for 1 hour (and increase time) 7 days per week.  Behavioral modification strategies: increasing lean protein intake, decreasing simple carbohydrates, increasing vegetables, increasing water intake, decreasing eating out, no skipping meals, meal planning and cooking strategies, keeping healthy foods in the home and planning for success.  Jessica [name] has agreed to follow-up with our clinic in 4 weeks. She was informed of the importance of frequent follow-up visits to maximize her success with intensive lifestyle modifications for  her multiple health conditions.   Objective:   Blood pressure (!) 148/86, pulse 69, temperature 97.6 F (36.4 C), height 5' 5"  (1.651 m), weight 217 lb (98.4 kg), SpO2 98 %. Body mass index is 36.11 kg/m.  General: Cooperative, alert, well developed, in no acute  distress. HEENT: Conjunctivae and lids unremarkable. Cardiovascular: Regular rhythm.  Lungs: Normal work of breathing. Neurologic: No focal deficits.   Lab Results  Component Value Date   CREATININE 0.96 03/11/2020   BUN 19 03/11/2020   NA 141 03/11/2020   K 4.8 03/11/2020   CL 102 03/11/2020   CO2 26 03/11/2020   Lab Results  Component Value Date   ALT 14 03/11/2020   AST 15 03/11/2020   ALKPHOS 93 03/11/2020   BILITOT 0.4 03/11/2020   Lab Results  Component Value Date   HGBA1C 7.8 (A) 05/14/2020   HGBA1C 7.1 (A) 12/31/2019   HGBA1C 6.9 (A) 10/28/2019   HGBA1C 6.7 (H) 07/24/2019   HGBA1C 7.2 (A) 02/20/2019   Lab Results  Component Value Date   TSH 2.15 10/16/2018   Lab Results  Component Value Date   CHOL 145 03/11/2020   HDL 58 03/11/2020   LDLCALC 74 03/11/2020   LDLDIRECT 143.2 11/23/2006   TRIG 62 03/11/2020   CHOLHDL 3 10/16/2018   Lab Results  Component Value Date   WBC 4.8 10/28/2018   HGB 13.4 10/28/2018   HCT 40.4 10/28/2018   MCV 97 10/28/2018   PLT 260 10/28/2018   Lab Results  Component Value Date   IRON 74 10/25/2017   TIBC 322 04/04/2012   Obesity Behavioral Intervention:   Approximately 15 minutes were spent on the discussion below.  ASK: We discussed the diagnosis of obesity with [name] today and Juliani agreed to give [place] permission to discuss obesity behavioral modification therapy today.  ASSESS: Jessica [name] has the diagnosis of obesity and her BMI today is 36.2. Jessica [name] is in the action stage of change.   ADVISE: Jessica [name] was educated on the multiple health risks of obesity as well as the benefit of weight loss to improve her health. She was advised of the need for long term treatment and the importance of lifestyle modifications to improve her current health and to decrease her risk of future health problems.  AGREE: Multiple dietary modification options and treatment options were discussed and Jessica [name] agreed to follow the  recommendations documented in the above note.  ARRANGE: Kaijah was educated on the importance of frequent visits to treat obesity as outlined per CMS and USPSTF guidelines and agreed to schedule her next follow up appointment today.  Attestation Statements:   Reviewed by clinician on day of visit: allergies, medications, problem list, medical history, surgical history, family history, social history, and previous encounter notes.  I, Water quality scientist, CMA, am acting as Location manager for [employer], DO  I have reviewed the above documentation for accuracy and completeness, and I agree with the above. [name], DO

## 2020-06-02 ENCOUNTER — Ambulatory Visit
Admission: RE | Admit: 2020-06-02 | Discharge: 2020-06-02 | Disposition: A | Discharge status: Home / Self Care | Payer: Medicare Other | Source: Ambulatory Visit | Attending: Obstetrics and Gynecology | Admitting: Obstetrics and Gynecology

## 2020-06-02 DIAGNOSIS — R9389 Abnormal findings on diagnostic imaging of other specified body structures: Secondary | ICD-10-CM

## 2020-06-09 ENCOUNTER — Other Ambulatory Visit: Payer: Self-pay | Admitting: Family Medicine

## 2020-06-26 ENCOUNTER — Other Ambulatory Visit (INDEPENDENT_AMBULATORY_CARE_PROVIDER_SITE_OTHER): Payer: Self-pay | Admitting: Bariatrics

## 2020-06-26 DIAGNOSIS — E559 Vitamin D deficiency, unspecified: Secondary | ICD-10-CM

## 2020-06-28 ENCOUNTER — Ambulatory Visit (INDEPENDENT_AMBULATORY_CARE_PROVIDER_SITE_OTHER): Payer: Medicare Other | Admitting: Bariatrics

## 2020-06-28 ENCOUNTER — Encounter (INDEPENDENT_AMBULATORY_CARE_PROVIDER_SITE_OTHER): Payer: Self-pay | Admitting: Bariatrics

## 2020-06-28 ENCOUNTER — Other Ambulatory Visit: Payer: Self-pay

## 2020-06-28 VITALS — BP 153/63 | HR 61 | Temp 97.9°F | Ht 65.0 in | Wt 220.0 lb

## 2020-06-28 DIAGNOSIS — E669 Obesity, unspecified: Secondary | ICD-10-CM | POA: Diagnosis not present

## 2020-06-28 DIAGNOSIS — E1169 Type 2 diabetes mellitus with other specified complication: Secondary | ICD-10-CM | POA: Diagnosis not present

## 2020-06-28 DIAGNOSIS — Z6836 Body mass index (BMI) 36.0-36.9, adult: Secondary | ICD-10-CM

## 2020-06-28 DIAGNOSIS — Z9189 Other specified personal risk factors, not elsewhere classified: Secondary | ICD-10-CM | POA: Diagnosis not present

## 2020-06-28 DIAGNOSIS — E559 Vitamin D deficiency, unspecified: Secondary | ICD-10-CM | POA: Diagnosis not present

## 2020-06-28 MED ORDER — VITAMIN D (ERGOCALCIFEROL) 1.25 MG (50000 UNIT) PO CAPS: 50000.0000 [IU] | ORAL_CAPSULE | ORAL | 0 refills | Status: DC

## 2020-06-29 NOTE — Progress Notes (Signed)
Chief Complaint:   OBESITY Jessica [name] is here to discuss her progress with her obesity treatment plan along with follow-up of her obesity related diagnoses. Jessica [name] is on the Category 3 Plan and states she is following her eating plan approximately 90% of the time. Jessica [name] states she is exercising with Cubii for 60 minutes 7 times per week.  Today's visit was #: 14 Starting weight: 246 lbs Starting date: 03/12/2019 Today's weight: 220 lbs Today's date: 06/28/2020 Total lbs lost to date: 26 Total lbs lost since last in-office visit: 0  Interim History: Jessica [name] is up 3 lbs since her last visit, but she has done well overall.  Subjective:   1. Vitamin D deficiency Jessica [name] on Vit D, and she denies muscle weakness, nausea, or vomiting.  2. Type 2 diabetes mellitus with obesity (Jessica [name]) Jessica [name] is taking Humulin 70/30. Her fasting BGs range in the 190's with one low at 48. Last A1c was 7.8.  3. At risk for hyperglycemia Jessica [name] is at increased risk for hyperglycemia due to diabetes mellitus, and diet changes.  Assessment/Plan:   1. Vitamin D deficiency Low Vitamin D level contributes to fatigue and are associated with obesity, breast, and colon cancer. We will refill prescription Vitamin D for 1 month. Jessica [name] will follow-up for routine testing of Vitamin D, at least 2-3 times per year to avoid over-replacement.  - Vitamin D, Ergocalciferol, (DRISDOL) 1.25 MG (50000 UNIT) CAPS capsule; Take 1 capsule (50,000 Units total) by mouth every 7 (seven) days.  Dispense: 4 capsule; Refill: 0  2. Type 2 diabetes mellitus with obesity (HCC) Good blood sugar control is important to decrease the likelihood of diabetic complications such as nephropathy, neuropathy, limb loss, blindness, coronary artery disease, and death. Intensive lifestyle modification including diet, exercise and weight loss are the first line of treatment for diabetes. Jessica [name] will continue her medications, and she will eat later to avoid  low blood sugar.  3. At risk for hyperglycemia Jessica [name] was given approximately 15 minutes of counseling today regarding prevention of hyperglycemia. She was advised of hyperglycemia causes and the fact hyperglycemia is often asymptomatic. Jessica [name] was instructed to avoid skipping meals, eat regular protein rich meals and schedule low calorie but protein rich snacks as needed.   Repetitive spaced learning was employed today to elicit superior memory formation and behavioral change  4. Class 2 severe obesity with serious comorbidity and body mass index (BMI) of 36.0 to 36.9 in adult, unspecified obesity type [name]) Jessica [name] is currently in the action stage of change. As such, her goal is to continue with weight loss efforts. She has agreed to the Category 3 Plan.   We discussed her eating her dinner later.  Exercise goals: As is.  Behavioral modification strategies: increasing lean protein intake, decreasing simple carbohydrates, increasing vegetables, increasing water intake, decreasing eating out, no skipping meals, meal planning and cooking strategies, keeping healthy foods in the home and planning for success.  Jessica [name] has agreed to follow-up with our clinic in 4 weeks. She was informed of the importance of frequent follow-up visits to maximize her success with intensive lifestyle modifications for her multiple health conditions.   Objective:   Blood pressure (!) 153/63, pulse 61, temperature 97.9 F (36.6 C), temperature source Oral, height 5' 5"  (1.651 m), weight 220 lb (99.8 kg), SpO2 99 %. Body mass index is 36.61 kg/m.  General: Cooperative, alert, well developed, in no acute distress. HEENT: Conjunctivae and lids unremarkable. Cardiovascular: Regular rhythm.  Lungs: Normal work  of breathing. Neurologic: No focal deficits.   Lab Results  Component Value Date   CREATININE 0.96 03/11/2020   BUN 19 03/11/2020   NA 141 03/11/2020   K 4.8 03/11/2020   CL 102 03/11/2020   CO2 26  03/11/2020   Lab Results  Component Value Date   ALT 14 03/11/2020   AST 15 03/11/2020   ALKPHOS 93 03/11/2020   BILITOT 0.4 03/11/2020   Lab Results  Component Value Date   HGBA1C 7.8 (A) 05/14/2020   HGBA1C 7.1 (A) 12/31/2019   HGBA1C 6.9 (A) 10/28/2019   HGBA1C 6.7 (H) 07/24/2019   HGBA1C 7.2 (A) 02/20/2019   No results found for: INSULIN Lab Results  Component Value Date   TSH 2.15 10/16/2018   Lab Results  Component Value Date   CHOL 145 03/11/2020   HDL 58 03/11/2020   LDLCALC 74 03/11/2020   LDLDIRECT 143.2 11/23/2006   TRIG 62 03/11/2020   CHOLHDL 3 10/16/2018   Lab Results  Component Value Date   WBC 4.8 10/28/2018   HGB 13.4 10/28/2018   HCT 40.4 10/28/2018   MCV 97 10/28/2018   PLT 260 10/28/2018   Lab Results  Component Value Date   IRON 74 10/25/2017   TIBC 322 04/04/2012   Attestation Statements:   Reviewed by clinician on day of visit: allergies, medications, problem list, medical history, surgical history, family history, social history, and previous encounter notes.   Wilhemena Durie, am acting as Location manager for [employer], DO.  I have reviewed the above documentation for accuracy and completeness, and I agree with the above. Jessica Lesch, DO

## 2020-06-30 ENCOUNTER — Encounter (INDEPENDENT_AMBULATORY_CARE_PROVIDER_SITE_OTHER): Payer: Self-pay | Admitting: Bariatrics

## 2020-07-07 ENCOUNTER — Other Ambulatory Visit: Payer: Self-pay | Admitting: Physician Assistant

## 2020-07-08 ENCOUNTER — Encounter: Payer: Self-pay | Admitting: Family Medicine

## 2020-07-27 ENCOUNTER — Other Ambulatory Visit (INDEPENDENT_AMBULATORY_CARE_PROVIDER_SITE_OTHER): Payer: Self-pay | Admitting: Bariatrics

## 2020-07-27 DIAGNOSIS — E559 Vitamin D deficiency, unspecified: Secondary | ICD-10-CM

## 2020-07-28 ENCOUNTER — Ambulatory Visit (INDEPENDENT_AMBULATORY_CARE_PROVIDER_SITE_OTHER): Payer: Medicare Other | Admitting: Bariatrics

## 2020-08-05 ENCOUNTER — Other Ambulatory Visit: Payer: Self-pay | Admitting: Podiatry

## 2020-08-07 ENCOUNTER — Other Ambulatory Visit: Payer: Self-pay | Admitting: Physician Assistant

## 2020-08-09 ENCOUNTER — Ambulatory Visit (INDEPENDENT_AMBULATORY_CARE_PROVIDER_SITE_OTHER): Payer: Medicare Other | Admitting: Bariatrics

## 2020-08-09 ENCOUNTER — Encounter (INDEPENDENT_AMBULATORY_CARE_PROVIDER_SITE_OTHER): Payer: Self-pay | Admitting: Bariatrics

## 2020-08-09 ENCOUNTER — Other Ambulatory Visit: Payer: Self-pay

## 2020-08-09 VITALS — BP 146/82 | HR 71 | Temp 97.9°F | Ht 65.0 in | Wt 219.0 lb

## 2020-08-09 DIAGNOSIS — E669 Obesity, unspecified: Secondary | ICD-10-CM | POA: Diagnosis not present

## 2020-08-09 DIAGNOSIS — Z9189 Other specified personal risk factors, not elsewhere classified: Secondary | ICD-10-CM

## 2020-08-09 DIAGNOSIS — Z6841 Body Mass Index (BMI) 40.0 and over, adult: Secondary | ICD-10-CM

## 2020-08-09 DIAGNOSIS — E559 Vitamin D deficiency, unspecified: Secondary | ICD-10-CM | POA: Diagnosis not present

## 2020-08-09 DIAGNOSIS — E1169 Type 2 diabetes mellitus with other specified complication: Secondary | ICD-10-CM

## 2020-08-09 MED ORDER — VITAMIN D (ERGOCALCIFEROL) 1.25 MG (50000 UNIT) PO CAPS: 50000.0000 [IU] | ORAL_CAPSULE | ORAL | 0 refills | Status: DC

## 2020-08-09 NOTE — Telephone Encounter (Signed)
MEDICATION REFILL REQUEST  Date of last office visit 06/10/19  Cancels/No show? none  Future office visit scheduled? no  Please advise on refill.

## 2020-08-11 ENCOUNTER — Other Ambulatory Visit: Payer: Self-pay | Admitting: Family Medicine

## 2020-08-12 ENCOUNTER — Other Ambulatory Visit: Payer: Self-pay

## 2020-08-12 ENCOUNTER — Ambulatory Visit (INDEPENDENT_AMBULATORY_CARE_PROVIDER_SITE_OTHER): Payer: Medicare Other | Admitting: Endocrinology

## 2020-08-12 VITALS — BP 126/60 | HR 68 | Ht 65.0 in | Wt 222.2 lb

## 2020-08-12 DIAGNOSIS — E118 Type 2 diabetes mellitus with unspecified complications: Secondary | ICD-10-CM

## 2020-08-12 DIAGNOSIS — Z794 Long term (current) use of insulin: Secondary | ICD-10-CM | POA: Diagnosis not present

## 2020-08-12 DIAGNOSIS — E1143 Type 2 diabetes mellitus with diabetic autonomic (poly)neuropathy: Secondary | ICD-10-CM

## 2020-08-12 LAB — POCT GLYCOSYLATED HEMOGLOBIN (HGB A1C): Hemoglobin A1C: 8.1 % — AB (ref 4.0–5.6)

## 2020-08-12 MED ORDER — INSULIN NPH ISOPHANE & REGULAR (70-30) 100 UNIT/ML ~~LOC~~ SUSP: SUBCUTANEOUS | 3 refills | Status: DC

## 2020-08-12 MED ORDER — OZEMPIC (0.25 OR 0.5 MG/DOSE) 2 MG/1.5ML ~~LOC~~ SOPN: 0.2500 mg | PEN_INJECTOR | SUBCUTANEOUS | 3 refills | Status: DC

## 2020-08-12 NOTE — Progress Notes (Signed)
Subjective:    Patient ID: Jessica [name] Asare, female    DOB: Apr 30, 1951, 70 y.o.   MRN: [id number]  HPI Pt returns for f/u of diabetes mellitus:  DM type: Insulin-requiring type 2. Dx'ed: 2000.  Complications: PN and AN.   Therapy: insulin since 2005 GDM: never DKA: never Severe hypoglycemia: never.  Pancreatitis: never.  SDOH: she takes human insulin, due to cost; her ability to care for her DM is compromised by her husband's illness.   Other: she has done better on a BID insulin regimen.   Interval history: pt states cbg's vary from 61-236.  It is in general lowest in the middle of the night.  pt states she feels well in general.  She has hypoglycemia approx once per month, and these episodes are mild.  Pt says she never misses the insulin.  Main symptom is weight gain.   Pt also has MNG (dx'ed 2020; [place] in 2021 was unchanged; RLP nodule needs surveillance).    Past Medical History:  Diagnosis Date  . Anxiety   . Arthritis    bilateral knees  . Asthma    childhood  . Autonomic neuropathy    diabetic  . Cataract   . Celiac disease   . Constipation   . Depression   . Diabetes mellitus    type II, Hemoglobin A1C 9.9 10/05/2011  . Diabetic neuropathy ([place])   . Diverticulosis   . Fatty liver   . Gastroparesis   . GERD (gastroesophageal reflux disease)   . Hyperlipidemia   . Hypertension   . IBS (irritable bowel syndrome)   . Kidney stones   . Personal history of colonic polyps 03/2010   hyperplastic  . Shingles 2021    Past Surgical History:  Procedure Laterality Date  . APPENDECTOMY    . CATARACT EXTRACTION Right 04/29/2018  . CATARACT EXTRACTION Left 03/2018  . COLONOSCOPY    . DILATATION & CURRETTAGE/HYSTEROSCOPY WITH RESECTOCOPE N/A 03/24/2013   Procedure: DILATATION & CURETTAGE, HYSTEROSCOPY WITH RESECTION;  Surgeon: Olga Millers, MD;  Location: [facility];  Service: Gynecology;  Laterality: N/A;  . HYSTEROSCOPY WITH D & C N/A 11/08/2015   Procedure: DILATATION  AND CURETTAGE /HYSTEROSCOPY;  Surgeon: Olga Millers, MD;  Location: [facility];  Service: Gynecology;  Laterality: N/A;  . left breast cyst removal  2000  . LEFT HEART CATH AND CORONARY ANGIOGRAPHY N/A 10/31/2018   Procedure: LEFT HEART CATH AND CORONARY ANGIOGRAPHY;  Surgeon: Jettie Booze, MD;  Location: [facility];  Service: Cardiovascular;  Laterality: N/A;  . TUBAL LIGATION      Social History   Socioeconomic History  . Marital status: Married    Spouse name: Devar Golubski  . Number of children: 2  . Years of education: Not on file  . Highest education level: Not on file  Occupational History  . 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 Occupation: retired  Tobacco Use  . Smoking status: Never Smoker  . Smokeless tobacco: Never Used  Vaping Use  . Vaping Use: Never used  Substance and Sexual Activity  . Alcohol use: No  . Drug use: No  . Sexual activity: Yes    Birth control/protection: Post-menopausal, Surgical  Other Topics Concern  . Not on file  Social History Narrative  . Not on file   Social Determinants of Health   Financial Resource Strain: Low Risk   . Difficulty of Paying  Living Expenses: Not hard at all  Food Insecurity: No Food Insecurity  . Worried About Charity fundraiser in the Last Year: Never true  . Ran Out of Food in the Last Year: Never true  Transportation Needs: No Transportation Needs  . Lack of Transportation (Medical): No  . Lack of Transportation (Non-Medical): No  Physical Activity: Insufficiently Active  . Days of Exercise per Week: 2 days  . Minutes of Exercise per Session: 60 min  Stress: Stress Concern Present  . Feeling of Stress : To some extent  Social Connections: Moderately Integrated  . Frequency of Communication with Friends and Family: More than three times a week  . Frequency of Social Gatherings with Friends and Family: Never  . Attends Religious Services:  More than 4 times per year  . Active Member of Clubs or Organizations: No  . Attends Archivist Meetings: Never  . Marital Status: Married  Human resources officer Violence: Not At Risk  . Fear of Current or Ex-Partner: No  . Emotionally Abused: No  . Physically Abused: No  . Sexually Abused: No    Current Outpatient Medications on File Prior to Visit  Medication Sig Dispense Refill  . albuterol (VENTOLIN HFA) 108 (90 Base) MCG/ACT inhaler Inhale 2 puffs into the lungs every 6 (six) hours as needed for wheezing or shortness of breath. 8 g 3  . atorvastatin (LIPITOR) 40 MG tablet TAKE 1 TABLET BY MOUTH EVERY DAY 90 tablet 3  . BD INSULIN SYRINGE U/F 31G X 5/16" 1 ML MISC USE UP TO 3 TIMES A DAY 270 each 3  . DEXILANT 60 MG capsule TAKE 1 CAPSULE BY MOUTH EVERY DAY 90 capsule 4  . fluticasone (FLONASE) 50 MCG/ACT nasal spray Place 1 spray into both nostrils daily. 16 g 6  . furosemide (LASIX) 20 MG tablet TAKE 1 TABLET (20 MG TOTAL) BY MOUTH DAILY AS NEEDED FOR FLUID OR EDEMA. 90 tablet 3  . gabapentin (NEURONTIN) 600 MG tablet TAKE 1 TABLET IN THE MORNING AND 2 TABLETS AT BEDTIME 270 tablet 2  . ibuprofen (ADVIL) 200 MG tablet Take 200 mg by mouth daily.    Marland Kitchen ketoconazole (NIZORAL) 2 % cream APPLY TWICE A DAY FOR 2 WEEKS AS NEEDED FOR RASH 30 g 2  . olmesartan (BENICAR) 40 MG tablet TAKE 1 TABLET BY MOUTH EVERY DAY 90 tablet 3  . ONETOUCH ULTRA test strip USE 2 TIMES DAILY. AND LANCETS 2/DAY 200 strip 3  . polyethylene glycol powder ([facility] PURELAX) 17 GM/SCOOP powder DISSOLVE 1 CAPFUL IN AT LEAST 8 OUNCES WATER/JUICE AND DRINK TWICE DAILY 1020 g 1  . sucralfate (CARAFATE) 1 GM/10ML suspension Take 10 mLs (1 g total) by mouth 4 (four) times daily. 420 mL 1  . valACYclovir (VALTREX) 500 MG tablet Take 500 mg by mouth daily.    . Vitamin D, Ergocalciferol, (DRISDOL) 1.25 MG (50000 UNIT) CAPS capsule Take 1 capsule (50,000 Units total) by mouth every 7 (seven) days. 4 capsule 0   No current  facility-administered medications on file prior to visit.    Allergies  Allergen Reactions  . Gluten Meal Other (See Comments)    No wheat or soy  . Nsaids Nausea And Vomiting and Other (See Comments)    Cannot tolerate large doses   . Soy Allergy   . Wheat Bran     Family History  Problem Relation Age of Onset  . Hypertension Mother   . Colon cancer Sister  dx in her early 49s  . Diabetes Sister   . Endometrial cancer Sister   . Hypertension Brother   . Other Father        2 collapsed lungs  . COPD Father   . Diabetes Father   . Heart attack Father   . Heart failure Father   . High blood pressure Father   . Colon polyps Neg Hx   . Esophageal cancer Neg Hx   . Stomach cancer Neg Hx     BP 126/60 (BP Location: Right Arm, Patient Position: Sitting, Cuff Size: Large)   Pulse 68   Ht 5' 5"  (1.651 m)   Wt 222 lb 3.2 oz (100.8 kg)   SpO2 96%   BMI 36.98 kg/m    Review of Systems     Objective:   Physical Exam VITAL SIGNS:  See vs page GENERAL: no distress Pulses: dorsalis pedis intact bilat.   MSK: no deformity of the feet CV: no leg edema Skin:  no ulcer on the feet.  normal color and temp on the feet. Neuro: sensation is intact to touch on the feet.    Lab Results  Component Value Date   HGBA1C 8.1 (A) 08/12/2020       Assessment & Plan:  Insulin-requiring type 2 DM:  Uncontrolled.   Hypoglycemia, due to insulin: We discussed. Despite the cost, we decided to add GLP med.    Patient Instructions  I have sent a prescription to your pharmacy, to add "Ozempic," and please insulin to the numbers listed below.   check your blood sugar twice a day.  vary the time of day when you check, between before the 3 meals, and at bedtime.  also check if you have symptoms of your blood sugar being too high or too low.  please keep a record of the readings and bring it to your next appointment here (or you can bring the meter itself).  You can write it on any piece  of paper.  please call us sooner if your blood sugar goes below 70, or if you have a lot of readings over 200.  Please come back for a follow-up appointment in 6 weeks.

## 2020-08-12 NOTE — Patient Instructions (Addendum)
I have sent a prescription to your pharmacy, to add "Ozempic," and please insulin to the numbers listed below.   check your blood sugar twice a day.  vary the time of day when you check, between before the 3 meals, and at bedtime.  also check if you have symptoms of your blood sugar being too high or too low.  please keep a record of the readings and bring it to your next appointment here (or you can bring the meter itself).  You can write it on any piece of paper.  please call us sooner if your blood sugar goes below 70, or if you have a lot of readings over 200.  Please come back for a follow-up appointment in 6 weeks.

## 2020-08-12 NOTE — Progress Notes (Signed)
Chief Complaint:   OBESITY Jessica [name] is here to discuss her progress with her obesity treatment plan along with follow-up of her obesity related diagnoses. Jessica [name] is on the Category 3 Plan and states she is following her eating plan approximately 95% of the time. Jessica [name] states she is doing [place] and getting 7420 steps 7 days per week.  Today's visit was #: 18 Starting weight: 246 lbs Starting date: 03/12/2019 Today's weight: 219 lbs Today's date: 08/09/2020 Total lbs lost to date: 27 lbs Total lbs lost since last in-office visit: 1 lb  Interim History: Jessica [name] is down 1 additional pound and is doing very well overall.  She has struggled with stress and increased stress eating.  Subjective:   1. Type 2 diabetes mellitus with obesity (Jessica [name]) She is taking Humulin 70/30.  Lab Results  Component Value Date   HGBA1C 7.8 (A) 05/14/2020   HGBA1C 7.1 (A) 12/31/2019   HGBA1C 6.9 (A) 10/28/2019   Lab Results  Component Value Date   MICROALBUR 0.7 10/25/2017   LDLCALC 74 03/11/2020   CREATININE 0.96 03/11/2020   2. Vitamin D deficiency Jessica [name] Vitamin D level was 61.6 on 03/11/2020. She is currently taking prescription vitamin D 50,000 IU each week. She denies nausea, vomiting or muscle weakness.  Assessment/Plan:   1. Type 2 diabetes mellitus with obesity (Jessica [name]) Good blood sugar control is important to decrease the likelihood of diabetic complications such as nephropathy, neuropathy, limb loss, blindness, coronary artery disease, and death. Intensive lifestyle modification including diet, exercise and weight loss are the first line of treatment for diabetes.  Continue insulin NPH-regular Human (Humulin 70/30).  2. Vitamin D deficiency Low Vitamin D level contributes to fatigue and are associated with obesity, breast, and colon cancer. She agrees to continue to take prescription Vitamin D @50 ,000 IU every week and will follow-up for routine testing of Vitamin D, at least 2-3 times per  year to avoid over-replacement.  - Refill Vitamin D, Ergocalciferol, (DRISDOL) 1.25 MG (50000 UNIT) CAPS capsule; Take 1 capsule (50,000 Units total) by mouth every 7 (seven) days.  Dispense: 4 capsule; Refill: 0  3. Obesity, current BMI 36  Jessica [name] is currently in the action stage of change. As such, her goal is to continue with weight loss efforts. She has agreed to the Category 3 Plan.   She will work on meal planning and will continue to adhere closely to the plan.  Exercise goals: As is.  Behavioral modification strategies: increasing lean protein intake, decreasing simple carbohydrates, increasing vegetables, increasing water intake, decreasing eating out, no skipping meals, meal planning and cooking strategies, keeping healthy foods in the home and planning for success.  Jessica [name] has agreed to follow-up with our clinic in 4 weeks. She was informed of the importance of frequent follow-up visits to maximize her success with intensive lifestyle modifications for her multiple health conditions.   Objective:   Blood pressure (!) 146/82, pulse 71, temperature 97.9 F (36.6 C), height 5' 5"  (1.651 m), weight 219 lb (99.3 kg), SpO2 96 %. Body mass index is 36.44 kg/m.  General: Cooperative, alert, well developed, in no acute distress. HEENT: Conjunctivae and lids unremarkable. Cardiovascular: Regular rhythm.  Lungs: Normal work of breathing. Neurologic: No focal deficits.   Lab Results  Component Value Date   CREATININE 0.96 03/11/2020   BUN 19 03/11/2020   NA 141 03/11/2020   K 4.8 03/11/2020   CL 102 03/11/2020   CO2 26 03/11/2020   Lab Results  Component Value Date   ALT 14 03/11/2020   AST 15 03/11/2020   ALKPHOS 93 03/11/2020   BILITOT 0.4 03/11/2020   Lab Results  Component Value Date   HGBA1C 7.8 (A) 05/14/2020   HGBA1C 7.1 (A) 12/31/2019   HGBA1C 6.9 (A) 10/28/2019   HGBA1C 6.7 (H) 07/24/2019   HGBA1C 7.2 (A) 02/20/2019   Lab Results  Component Value Date    TSH 2.15 10/16/2018   Lab Results  Component Value Date   CHOL 145 03/11/2020   HDL 58 03/11/2020   LDLCALC 74 03/11/2020   LDLDIRECT 143.2 11/23/2006   TRIG 62 03/11/2020   CHOLHDL 3 10/16/2018   Lab Results  Component Value Date   WBC 4.8 10/28/2018   HGB 13.4 10/28/2018   HCT 40.4 10/28/2018   MCV 97 10/28/2018   PLT 260 10/28/2018   Lab Results  Component Value Date   IRON 74 10/25/2017   TIBC 322 04/04/2012   Obesity Behavioral Intervention:   Approximately 15 minutes were spent on the discussion below.  ASK: We discussed the diagnosis of obesity with Jessica [name] today and Jessica [name] agreed to give [place] permission to discuss obesity behavioral modification therapy today.  ASSESS: Jessica [name] has the diagnosis of obesity and her BMI today is 36.5. Jessica [name] is in the action stage of change.   ADVISE: Jessica [name] was educated on the multiple health risks of obesity as well as the benefit of weight loss to improve her health. She was advised of the need for long term treatment and the importance of lifestyle modifications to improve her current health and to decrease her risk of future health problems.  AGREE: Multiple dietary modification options and treatment options were discussed and Jessica [name] agreed to follow the recommendations documented in the above note.  ARRANGE: Jessica [name] was educated on the importance of frequent visits to treat obesity as outlined per CMS and USPSTF guidelines and agreed to schedule her next follow up appointment today.  Attestation Statements:   Reviewed by clinician on day of visit: allergies, medications, problem list, medical history, surgical history, family history, social history, and previous encounter notes.  I, Water quality scientist, CMA, am acting as Location manager for [employer], DO  I have reviewed the above documentation for accuracy and completeness, and I agree with the above. [name], DO

## 2020-08-16 MED ORDER — LANSOPRAZOLE 30 MG PO CPDR: 30.0000 mg | DELAYED_RELEASE_CAPSULE | Freq: Every day | ORAL | 0 refills | Status: DC

## 2020-08-20 ENCOUNTER — Other Ambulatory Visit: Payer: Self-pay | Admitting: Endocrinology

## 2020-08-25 ENCOUNTER — Encounter: Payer: Self-pay | Admitting: Family Medicine

## 2020-08-25 ENCOUNTER — Telehealth (INDEPENDENT_AMBULATORY_CARE_PROVIDER_SITE_OTHER): Payer: Medicare Other | Admitting: Family Medicine

## 2020-08-25 ENCOUNTER — Other Ambulatory Visit: Payer: Self-pay

## 2020-08-25 DIAGNOSIS — J029 Acute pharyngitis, unspecified: Secondary | ICD-10-CM

## 2020-08-25 DIAGNOSIS — J301 Allergic rhinitis due to pollen: Secondary | ICD-10-CM | POA: Diagnosis not present

## 2020-08-25 MED ORDER — FLUTICASONE PROPIONATE 50 MCG/ACT NA SUSP: 1.0000 | Freq: Every day | NASAL | 6 refills | Status: DC

## 2020-08-25 NOTE — Patient Instructions (Signed)
COVID testing at [place] and Thurs (5:15-6:30pm)   Please call [phone] to schedule or schedule online at [email or web address]

## 2020-08-25 NOTE — Progress Notes (Signed)
Virtual Visit via Video Note  Subjective  CC:  Chief Complaint  Patient presents with  . Sore Throat    All symptoms started Friday, denies fever. No recent COVID test   . Ear Pain  . Nasal Congestion  . Fatigue     I connected with Jessica [name] on 08/25/20 at  4:00 PM EDT by a video enabled telemedicine application and verified that I am speaking with the correct person using two identifiers. Location patient: Home Location provider: [facility] [place], Office Persons participating in the virtual visit: Jessica [name], Jessica Arnt, MD Jessica [name] CMA  I discussed the limitations of evaluation and management by telemedicine and the availability of in person appointments. The patient expressed understanding and agreed to proceed. HPI: Jessica [name] is a 70 y.o. female who was contacted today to address the problems listed above in the chief complaint. . 70 year old female with history of allergies and diabetes presents with 4-day history of sore throat that is improving, clear rhinorrhea on the right mostly, right ear fullness with occasional bilateral ear pain, and significant fatigue.  She has an occasional cough but nothing severe.  No shortness of breath or chest pain.  No GI symptoms.  She denies sinus pain or headaches.  She has had 2 COVID vaccinations with the last one almost a year ago.  She is not tested for COVID.  She has not had any significant exposures that she is aware of.  She has no myalgias.  She is not taking anything for her symptoms.   Assessment  1. Seasonal allergic rhinitis due to pollen   2. Sore throat      Plan   Seasonal allergic rhinitis plus minus viral URI, possible COVID: Symptoms most consistent with allergies but due to sore throat and fatigue recommend testing for COVID just to be sure.  Supportive care with Flonase and oral decongestants.  She cannot tolerate Zyrtec.  Rest, Advil for sore throat salt water  gargles and follow-up if worsening.  Patient is scheduled for [facility] tomorrow at 5:15 PM.  Her sugars are reportedly stable I discussed the assessment and treatment plan with the patient. The patient was provided an opportunity to ask questions and all were answered. The patient agreed with the plan and demonstrated an understanding of the instructions.   The patient was advised to call back or seek an in-person evaluation if the symptoms worsen or if the condition fails to improve as anticipated. Follow up: As scheduled 10/07/2020  Meds ordered this encounter  Medications  . fluticasone (FLONASE) 50 MCG/ACT nasal spray    Sig: Place 1 spray into both nostrils daily.    Dispense:  16 g    Refill:  6      I reviewed the patients updated PMH, FH, and SocHx.    Patient Active Problem List   Diagnosis Date Noted  . Celiac disease 02/14/2018    Priority: High  . Diabetic neuropathy associated with type 2 diabetes mellitus ([place]) 02/14/2018    Priority: High  . Class 2 obesity due to excess calories with body mass index (BMI) of 37.0 to 37.9 in adult 02/14/2018    Priority: High  . Type 2 diabetes mellitus with complication, with long-term current use of insulin ([place]) 09/23/2010    Priority: High  . Combined hyperlipidemia associated with type 2 diabetes mellitus ([place]) 01/29/2009    Priority: High  . Essential hypertension  10/22/2006    Priority: High  . Osteopenia 02/14/2018    Priority: Medium  . Mild intermittent asthma 02/14/2018    Priority: Medium  . Degenerative arthritis of knee, bilateral 10/29/2015    Priority: Medium  . Degenerative cervical disc 07/27/2015    Priority: Medium  . OSA (obstructive sleep apnea) 02/28/2011    Priority: Medium  . Constipation, slow transit 09/23/2010    Priority: Medium  . FH: colon cancer 09/23/2010    Priority: Medium  . Personal history of colonic polyps 09/23/2010    Priority: Medium  . Diabetic gastroparesis ([place]) 02/27/2007     Priority: Medium  . Diabetic autonomic neuropathy associated with type 2 diabetes mellitus ([place]) 10/22/2006    Priority: Medium  . Bilateral lower extremity edema 01/20/2020    Priority: Low  . Fibroma of tongue 06/06/2016    Priority: Low  . Renal calculi 01/13/2016    Priority: Low  . Peripheral sensory neuropathy due to type 2 diabetes mellitus ([place]) 04/05/2020  . Thyroid nodule 10/28/2019  . Abnormal stress test   . Family history of malignant neoplasm of endometrium 10/08/2018  . Foot pain, bilateral 07/17/2018   Current Meds  Medication Sig  . albuterol (VENTOLIN HFA) 108 (90 Base) MCG/ACT inhaler Inhale 2 puffs into the lungs every 6 (six) hours as needed for wheezing or shortness of breath.  Marland Kitchen atorvastatin (LIPITOR) 40 MG tablet TAKE 1 TABLET BY MOUTH EVERY DAY  . BD INSULIN SYRINGE U/F 31G X 5/16" 1 ML MISC USE UP TO 3 TIMES A DAY  . DEXILANT 60 MG capsule TAKE 1 CAPSULE BY MOUTH EVERY DAY  . furosemide (LASIX) 20 MG tablet TAKE 1 TABLET (20 MG TOTAL) BY MOUTH DAILY AS NEEDED FOR FLUID OR EDEMA.  Marland Kitchen gabapentin (NEURONTIN) 600 MG tablet TAKE 1 TABLET IN THE MORNING AND 2 TABLETS AT BEDTIME  . ibuprofen (ADVIL) 200 MG tablet Take 200 mg by mouth daily.  . insulin NPH-regular Human (70-30) 100 UNIT/ML injection 30 units with breakfast and 5 units with supper.  Marland Kitchen ketoconazole (NIZORAL) 2 % cream APPLY TWICE A DAY FOR 2 WEEKS AS NEEDED FOR RASH  . lansoprazole (PREVACID) 30 MG capsule Take 1 capsule (30 mg total) by mouth daily at 12 noon.  Marland Kitchen olmesartan (BENICAR) 40 MG tablet TAKE 1 TABLET BY MOUTH EVERY DAY  . ONETOUCH ULTRA test strip USE 2 TIMES DAILY. AND LANCETS 2/DAY  . polyethylene glycol powder ([employer] PURELAX) 17 GM/SCOOP powder DISSOLVE 1 CAPFUL IN AT LEAST 8 OUNCES WATER/JUICE AND DRINK TWICE DAILY  . Semaglutide,0.25 or 0.5MG/DOS, (OZEMPIC, 0.25 OR 0.5 MG/DOSE,) 2 MG/1.5ML SOPN Inject 0.25 mg into the skin once a week.  . sucralfate (CARAFATE) 1 GM/10ML suspension Take  10 mLs (1 g total) by mouth 4 (four) times daily.  . valACYclovir (VALTREX) 500 MG tablet Take 500 mg by mouth daily.  . Vitamin D, Ergocalciferol, (DRISDOL) 1.25 MG (50000 UNIT) CAPS capsule Take 1 capsule (50,000 Units total) by mouth every 7 (seven) days.  . [DISCONTINUED] fluticasone (FLONASE) 50 MCG/ACT nasal spray Place 1 spray into both nostrils daily.    Allergies: Patient is allergic to gluten meal, nsaids, soy allergy, and wheat bran. Family History: Patient family history includes COPD in her father; Colon cancer in her sister; Diabetes in her father and sister; Endometrial cancer in her sister; Heart attack in her father; Heart failure in her father; High blood pressure in her father; Hypertension in her brother and mother; Other in  her father. Social History:  Patient  reports that she has never smoked. She has never used smokeless tobacco. She reports that she does not drink alcohol and does not use drugs.  Review of Systems: Constitutional: Negative for fever malaise or anorexia Cardiovascular: negative for chest pain Respiratory: negative for SOB or persistent cough Gastrointestinal: negative for abdominal pain  OBJECTIVE Vitals: There were no vitals taken for this visit. General: no acute distress , A&Ox3, appears well  Jessica Arnt, MD

## 2020-08-26 ENCOUNTER — Other Ambulatory Visit: Payer: Self-pay

## 2020-08-26 ENCOUNTER — Ambulatory Visit: Payer: Medicare Other | Attending: Internal Medicine

## 2020-08-26 DIAGNOSIS — Z20822 Contact with and (suspected) exposure to covid-19: Secondary | ICD-10-CM

## 2020-08-27 LAB — NOVEL CORONAVIRUS, NAA: SARS-CoV-2, NAA: NOT DETECTED

## 2020-08-27 LAB — SARS-COV-2, NAA 2 DAY TAT

## 2020-09-02 ENCOUNTER — Other Ambulatory Visit (INDEPENDENT_AMBULATORY_CARE_PROVIDER_SITE_OTHER): Payer: Self-pay | Admitting: Bariatrics

## 2020-09-02 DIAGNOSIS — E559 Vitamin D deficiency, unspecified: Secondary | ICD-10-CM

## 2020-09-07 ENCOUNTER — Encounter (INDEPENDENT_AMBULATORY_CARE_PROVIDER_SITE_OTHER): Payer: Self-pay | Admitting: Bariatrics

## 2020-09-07 ENCOUNTER — Other Ambulatory Visit: Payer: Self-pay

## 2020-09-07 ENCOUNTER — Ambulatory Visit (INDEPENDENT_AMBULATORY_CARE_PROVIDER_SITE_OTHER): Payer: Medicare Other | Admitting: Bariatrics

## 2020-09-07 VITALS — BP 146/84 | HR 81 | Temp 98.2°F | Ht 65.0 in | Wt 216.0 lb

## 2020-09-07 DIAGNOSIS — E559 Vitamin D deficiency, unspecified: Secondary | ICD-10-CM

## 2020-09-07 DIAGNOSIS — D508 Other iron deficiency anemias: Secondary | ICD-10-CM

## 2020-09-07 DIAGNOSIS — R252 Cramp and spasm: Secondary | ICD-10-CM

## 2020-09-07 DIAGNOSIS — Z6841 Body Mass Index (BMI) 40.0 and over, adult: Secondary | ICD-10-CM

## 2020-09-07 DIAGNOSIS — E538 Deficiency of other specified B group vitamins: Secondary | ICD-10-CM

## 2020-09-07 DIAGNOSIS — R5382 Chronic fatigue, unspecified: Secondary | ICD-10-CM

## 2020-09-07 DIAGNOSIS — K219 Gastro-esophageal reflux disease without esophagitis: Secondary | ICD-10-CM

## 2020-09-07 MED ORDER — VITAMIN D (ERGOCALCIFEROL) 1.25 MG (50000 UNIT) PO CAPS: 50000.0000 [IU] | ORAL_CAPSULE | ORAL | 0 refills | Status: DC

## 2020-09-08 NOTE — Progress Notes (Signed)
Chief Complaint:   OBESITY Jessica [name] is here to discuss her progress with her obesity treatment plan along with follow-up of her obesity related diagnoses. Jessica [name] is on the Category 3 Plan and states she is following her eating plan approximately 95% of the time. Jessica [name] states she is doing [place] 7 times per week.  Today's visit was #: 64 Starting weight: 246 lbs Starting date: 03/12/2019 Today's weight: 216 lbs Today's date: 09/07/2020 Total lbs lost to date: 30 Total lbs lost since last in-office visit: 3  Interim History: Jessica [name] is down another 3 lbs and doing well overall.  Subjective:   1. Vitamin D deficiency Jessica [name] is taking prescription Vit D as directed.  2. Gastroesophageal reflux disease, unspecified whether esophagitis present Jessica [name] is taking Prevacid for tx of symptoms.  3. Other iron deficiency anemia Jessica [name] had a colonoscopy 2 years ago per patient but  , per Epic colonoscopy was 6 months ago). She is not on an iron replacement supplement.  4. Muscle cramps Jessica [name] is not on medication to help with symptoms.  5. Vitamin B 12 deficiency Jessica [name] is not taking a B12 supplement.  6. Chronic fatigue Jessica [name] reports fatigue with certain activities.  Assessment/Plan:   1. Vitamin D deficiency Low Vitamin D level contributes to fatigue and are associated with obesity, breast, and colon cancer. She agrees to continue to take prescription Vitamin D @50 ,000 IU every week and will follow-up for routine testing of Vitamin D, at least 2-3 times per year to avoid over-replacement. Check labs today.  - Vitamin D, Ergocalciferol, (DRISDOL) 1.25 MG (50000 UNIT) CAPS capsule; Take 1 capsule (50,000 Units total) by mouth every 7 (seven) days.  Dispense: 4 capsule; Refill: 0  - VITAMIN D 25 Hydroxy (Vit-D Deficiency, Fractures)  2. Gastroesophageal reflux disease, unspecified whether esophagitis present Intensive lifestyle modifications are the first line treatment for this  issue. We discussed several lifestyle modifications today and she will continue to work on diet, exercise and weight loss efforts. Orders and follow up as documented in patient record. Continue current treatment plan. Control triggers.  Counseling . If a person has gastroesophageal reflux disease (GERD), food and stomach acid move back up into the esophagus and cause symptoms or problems such as damage to the esophagus. . Anti-reflux measures include: raising the head of the bed, avoiding tight clothing or belts, avoiding eating late at night, not lying down shortly after mealtime, and achieving weight loss. . Avoid ASA, NSAID's, caffeine, alcohol, and tobacco.  . OTC Pepcid and/or Tums are often very helpful for as needed use.  Jessica [name] Kitchen However, for persisting chronic or daily symptoms, stronger medications like Omeprazole may be needed. . You may need to avoid foods and drinks such as: ? Coffee and tea (with or without caffeine). ? Drinks that contain alcohol. ? Energy drinks and sports drinks. ? Bubbly (carbonated) drinks or sodas. ? Chocolate and cocoa. ? Peppermint and mint flavorings. ? Garlic and onions. ? Horseradish. ? Spicy and acidic foods. These include peppers, chili powder, curry powder, vinegar, hot sauces, and BBQ sauce. ? Citrus fruit juices and citrus fruits, such as oranges, lemons, and limes. ? Tomato-based foods. These include red sauce, chili, salsa, and pizza with red sauce. ? Fried and fatty foods. These include donuts, french fries, potato chips, and high-fat dressings. ? High-fat meats. These include hot dogs, rib eye steak, sausage, ham, and bacon.  3. Other iron deficiency anemia Check labs today.  - Anemia panel  4. Muscle  cramps Check labs today.  - Magnesium; Future  5. Vitamin B 12 deficiency The diagnosis was reviewed with the patient. Counseling provided today, see below. We will continue to monitor. Orders and follow up as documented in patient record.  Check labs today.  Counseling . The body needs vitamin B12: to make red blood cells; to make DNA; and to help the nerves work properly so they can carry messages from the brain to the body.  . The main causes of vitamin B12 deficiency include dietary deficiency, digestive diseases, pernicious anemia, and having a surgery in which part of the stomach or small intestine is removed.  . Certain medicines can make it harder for the body to absorb vitamin B12. These medicines include: heartburn medications; some antibiotics; some medications used to treat diabetes, gout, and high cholesterol.  . In some cases, there are no symptoms of this condition. If the condition leads to anemia or nerve damage, various symptoms can occur, such as weakness or fatigue, shortness of breath, and numbness or tingling in your hands and feet.   . Treatment:  o May include taking vitamin B12 supplements.  o Avoid alcohol.  o Eat lots of healthy foods that contain vitamin B12: - Beef, pork, chicken, [place], and organ meats, such as liver.  - Seafood: This includes clams, rainbow trout, salmon, tuna, and haddock. Eggs.  - Cereal and dairy products that are fortified: This means that vitamin B12 has been added to the food.  - Vitamin B12  6. Chronic fatigue Jessica [name] does feel that her weight is causing her energy to be lower than it should be. Fatigue may be related to obesity, depression or many other causes. Labs will be ordered, and in the meanwhile, Jessica [name] will focus on self care including making healthy food choices, increasing physical activity and focusing on stress reduction. Check labs today.  - TSH+T4F+T3Free  7. Obesity, current BMI 36  Jessica [name] is currently in the action stage of change. As such, her goal is to continue with weight loss efforts. She has agreed to the Category 3 Plan.   Meal plan Intentional eating  Exercise goals: As is  Behavioral modification strategies: increasing lean protein intake,  decreasing simple carbohydrates, increasing vegetables, increasing water intake, decreasing eating out, no skipping meals, meal planning and cooking strategies, keeping healthy foods in the home and planning for success.  Jessica [name] has agreed to follow-up with our clinic in 3-4 weeks. She was informed of the importance of frequent follow-up visits to maximize her success with intensive lifestyle modifications for her multiple health conditions.   Jessica [name] was informed we would discuss her lab results at her next visit unless there is a critical issue that needs to be addressed sooner. Jessica [name] agreed to keep her next visit at the agreed upon time to discuss these results.  Objective:   Blood pressure (!) 146/84, pulse 81, temperature 98.2 F (36.8 C), height 5' 5"  (1.651 m), weight 216 lb (98 kg), SpO2 98 %. Body mass index is 35.94 kg/m.  General: Cooperative, alert, well developed, in no acute distress. HEENT: Conjunctivae and lids unremarkable. Cardiovascular: Regular rhythm.  Lungs: Normal work of breathing. Neurologic: No focal deficits.   Lab Results  Component Value Date   CREATININE 0.96 03/11/2020   BUN 19 03/11/2020   NA 141 03/11/2020   K 4.8 03/11/2020   CL 102 03/11/2020   CO2 26 03/11/2020   Lab Results  Component Value Date   ALT 14 03/11/2020  AST 15 03/11/2020   ALKPHOS 93 03/11/2020   BILITOT 0.4 03/11/2020   Lab Results  Component Value Date   HGBA1C 8.1 (A) 08/12/2020   HGBA1C 7.8 (A) 05/14/2020   HGBA1C 7.1 (A) 12/31/2019   HGBA1C 6.9 (A) 10/28/2019   HGBA1C 6.7 (H) 07/24/2019   No results found for: INSULIN Lab Results  Component Value Date   TSH 1.320 09/07/2020   Lab Results  Component Value Date   CHOL 145 03/11/2020   HDL 58 03/11/2020   LDLCALC 74 03/11/2020   LDLDIRECT 143.2 11/23/2006   TRIG 62 03/11/2020   CHOLHDL 3 10/16/2018   Lab Results  Component Value Date   WBC 4.8 10/28/2018   HGB 13.4 10/28/2018   HCT 42.6 09/07/2020   MCV  97 10/28/2018   PLT 260 10/28/2018   Lab Results  Component Value Date   IRON WILL FOLLOW 09/07/2020   TIBC WILL FOLLOW 09/07/2020   FERRITIN 149 09/07/2020    Obesity Behavioral Intervention:   Approximately 15 minutes were spent on the discussion below.  ASK: We discussed the diagnosis of obesity with Jessica [name] today and Jessica [name] agreed to give [place] permission to discuss obesity behavioral modification therapy today.  ASSESS: Jessica [name] has the diagnosis of obesity and her BMI today is 36. Jessica [name] is in the action stage of change.   ADVISE: Jessica [name] was educated on the multiple health risks of obesity as well as the benefit of weight loss to improve her health. She was advised of the need for long term treatment and the importance of lifestyle modifications to improve her current health and to decrease her risk of future health problems.  AGREE: Multiple dietary modification options and treatment options were discussed and Izzah agreed to follow the recommendations documented in the above note.  ARRANGE: Jessica [name] was educated on the importance of frequent visits to treat obesity as outlined per CMS and USPSTF guidelines and agreed to schedule her next follow up appointment today.  Attestation Statements:   Reviewed by clinician on day of visit: allergies, medications, problem list, medical history, surgical history, family history, social history, and previous encounter notes.  Coral Ceo, CMA, am acting as Location manager for [employer], DO.  I have reviewed the above documentation for accuracy and completeness, and I agree with the above. [name], DO

## 2020-09-09 ENCOUNTER — Encounter (INDEPENDENT_AMBULATORY_CARE_PROVIDER_SITE_OTHER): Payer: Self-pay | Admitting: Bariatrics

## 2020-09-09 LAB — ANEMIA PANEL
Ferritin: 149 ng/mL (ref 15–150)
Folate, Hemolysate: 448 ng/mL
Folate, RBC: 1052 ng/mL (ref >498)
Hematocrit: 42.6 % (ref 34.0–46.6)
Iron Saturation: 41 % (ref 15–55)
Iron: 113 ug/dL (ref 27–139)
Retic Ct Pct: 1.4 % (ref 0.6–2.6)
Total Iron Binding Capacity: 276 ug/dL (ref 250–450)
UIBC: 163 ug/dL (ref 118–369)
Vitamin B-12: 838 pg/mL (ref 232–1245)

## 2020-09-09 LAB — VITAMIN D 25 HYDROXY (VIT D DEFICIENCY, FRACTURES): Vit D, 25-Hydroxy: 85 ng/mL (ref 30.0–100.0)

## 2020-09-09 LAB — TSH+T4F+T3FREE
Free T4: 1.18 ng/dL (ref 0.82–1.77)
T3, Free: 2.4 pg/mL (ref 2.0–4.4)
TSH: 1.32 u[IU]/mL (ref 0.450–4.500)

## 2020-09-15 NOTE — Progress Notes (Signed)
Pt advised.

## 2020-09-17 ENCOUNTER — Other Ambulatory Visit: Payer: Self-pay | Admitting: Endocrinology

## 2020-09-25 ENCOUNTER — Other Ambulatory Visit: Payer: Self-pay | Admitting: Endocrinology

## 2020-09-28 ENCOUNTER — Other Ambulatory Visit: Payer: Self-pay | Admitting: Physician Assistant

## 2020-09-28 MED ORDER — DEXLANSOPRAZOLE 60 MG PO CPDR: 60.0000 mg | DELAYED_RELEASE_CAPSULE | Freq: Every day | ORAL | 3 refills | Status: DC

## 2020-10-01 ENCOUNTER — Other Ambulatory Visit (INDEPENDENT_AMBULATORY_CARE_PROVIDER_SITE_OTHER): Payer: Self-pay | Admitting: Bariatrics

## 2020-10-01 ENCOUNTER — Ambulatory Visit (INDEPENDENT_AMBULATORY_CARE_PROVIDER_SITE_OTHER): Payer: Medicare Other | Admitting: Endocrinology

## 2020-10-01 ENCOUNTER — Other Ambulatory Visit: Payer: Self-pay

## 2020-10-01 VITALS — BP 134/76 | HR 71 | Ht 65.0 in | Wt 219.0 lb

## 2020-10-01 DIAGNOSIS — E118 Type 2 diabetes mellitus with unspecified complications: Secondary | ICD-10-CM

## 2020-10-01 DIAGNOSIS — Z794 Long term (current) use of insulin: Secondary | ICD-10-CM

## 2020-10-01 DIAGNOSIS — E559 Vitamin D deficiency, unspecified: Secondary | ICD-10-CM

## 2020-10-01 MED ORDER — INSULIN NPH ISOPHANE & REGULAR (70-30) 100 UNIT/ML ~~LOC~~ SUSP: SUBCUTANEOUS | 3 refills | Status: DC

## 2020-10-01 NOTE — Progress Notes (Signed)
Subjective:    Patient ID: Jessica [name], female    DOB: 05-30-1950, 70 y.o.   MRN: [id number]  HPI Pt returns for f/u of diabetes mellitus:  DM type: Insulin-requiring type 2. Dx'ed: 2000.  Complications: PN, GP, and other AN.   Therapy: insulin since 2005 GDM: never DKA: never Severe hypoglycemia: never.  Pancreatitis: never.  SDOH: she takes human insulin, due to cost; her ability to care for her DM is compromised by her husband's illness.    Other: she has done better on a BID insulin regimen.   Interval history: pt states cbg's vary from 61-236.  It is in general lowest in the middle of the night.  pt states she feels well in general.  She has hypoglycemia approx once per month, and these episodes are mild.  Pt says she never misses the insulin.  She has nausea since on Ozempic.  no cbg record, but states cbg's vary 71-281.  It is in general highest fasting.   Pt also has MNG (dx'ed 2020; [place] in 2021 was unchanged; RLP nodule needs surveillance).    Past Medical History:  Diagnosis Date  . Anxiety   . Arthritis    bilateral knees  . Asthma    childhood  . Autonomic neuropathy    diabetic  . Cataract   . Celiac disease   . Constipation   . Depression   . Diabetes mellitus    type II, Hemoglobin A1C 9.9 10/05/2011  . Diabetic neuropathy ([place])   . Diverticulosis   . Fatty liver   . Gastroparesis   . GERD (gastroesophageal reflux disease)   . Hyperlipidemia   . Hypertension   . IBS (irritable bowel syndrome)   . Kidney stones   . Personal history of colonic polyps 03/2010   hyperplastic  . Shingles 2021    Past Surgical History:  Procedure Laterality Date  . APPENDECTOMY    . CATARACT EXTRACTION Right 04/29/2018  . CATARACT EXTRACTION Left 03/2018  . COLONOSCOPY    . DILATATION & CURRETTAGE/HYSTEROSCOPY WITH RESECTOCOPE N/A 03/24/2013   Procedure: DILATATION & CURETTAGE, HYSTEROSCOPY WITH RESECTION;  Surgeon: Olga Millers, MD;  Location: [facility];  Service:  Gynecology;  Laterality: N/A;  . HYSTEROSCOPY WITH D & C N/A 11/08/2015   Procedure: DILATATION AND CURETTAGE /HYSTEROSCOPY;  Surgeon: Olga Millers, MD;  Location: [facility];  Service: Gynecology;  Laterality: N/A;  . left breast cyst removal  2000  . LEFT HEART CATH AND CORONARY ANGIOGRAPHY N/A 10/31/2018   Procedure: LEFT HEART CATH AND CORONARY ANGIOGRAPHY;  Surgeon: Jettie Booze, MD;  Location: [facility];  Service: Cardiovascular;  Laterality: N/A;  . TUBAL LIGATION      Social History   Socioeconomic History  . Marital status: Married    Spouse name: Devar Dilorenzo  . Number of children: 2  . Years of education: Not on file  . Highest education level: Not on file  Occupational History  . 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 Occupation: retired  Tobacco Use  . Smoking status: Never Smoker  . Smokeless tobacco: Never Used  Vaping Use  . Vaping Use: Never used  Substance and Sexual Activity  . Alcohol use: No  . Drug use: No  . Sexual activity: Yes    Birth control/protection: Post-menopausal, Surgical  Other Topics Concern  . Not on file  Social History Narrative  . Not on  file   Social Determinants of Health   Financial Resource Strain: Low Risk   . Difficulty of Paying Living Expenses: Not hard at all  Food Insecurity: No Food Insecurity  . Worried About Charity fundraiser in the Last Year: Never true  . Ran Out of Food in the Last Year: Never true  Transportation Needs: No Transportation Needs  . Lack of Transportation (Medical): No  . Lack of Transportation (Non-Medical): No  Physical Activity: Insufficiently Active  . Days of Exercise per Week: 2 days  . Minutes of Exercise per Session: 60 min  Stress: Stress Concern Present  . Feeling of Stress : To some extent  Social Connections: Moderately Integrated  . Frequency of Communication with Friends and Family: More than three times a week  .  Frequency of Social Gatherings with Friends and Family: Never  . Attends Religious Services: More than 4 times per year  . Active Member of Clubs or Organizations: No  . Attends Archivist Meetings: Never  . Marital Status: Married  Human resources officer Violence: Not At Risk  . Fear of Current or Ex-Partner: No  . Emotionally Abused: No  . Physically Abused: No  . Sexually Abused: No    Current Outpatient Medications on File Prior to Visit  Medication Sig Dispense Refill  . albuterol (VENTOLIN HFA) 108 (90 Base) MCG/ACT inhaler Inhale 2 puffs into the lungs every 6 (six) hours as needed for wheezing or shortness of breath. 8 g 3  . atorvastatin (LIPITOR) 40 MG tablet TAKE 1 TABLET BY MOUTH EVERY DAY 90 tablet 3  . BD INSULIN SYRINGE U/F 31G X 5/16" 1 ML MISC USE UP TO 3 TIMES A DAY 100 each 5  . dexlansoprazole (DEXILANT) 60 MG capsule Take 1 capsule (60 mg total) by mouth daily. D/c rx for lansoprazole; may try both generic and name brand dexilant rx Pt will pay out of pocket. Insurance will not cover 30 capsule 3  . fluticasone (FLONASE) 50 MCG/ACT nasal spray Place 1 spray into both nostrils daily. 16 g 6  . furosemide (LASIX) 20 MG tablet TAKE 1 TABLET (20 MG TOTAL) BY MOUTH DAILY AS NEEDED FOR FLUID OR EDEMA. 90 tablet 3  . gabapentin (NEURONTIN) 600 MG tablet TAKE 1 TABLET IN THE MORNING AND 2 TABLETS AT BEDTIME 270 tablet 2  . ibuprofen (ADVIL) 200 MG tablet Take 200 mg by mouth daily.    Marland Kitchen ketoconazole (NIZORAL) 2 % cream APPLY TWICE A DAY FOR 2 WEEKS AS NEEDED FOR RASH 30 g 2  . olmesartan (BENICAR) 40 MG tablet TAKE 1 TABLET BY MOUTH EVERY DAY 90 tablet 3  . ONETOUCH ULTRA test strip USE 2 TIMES DAILY. AND LANCETS 2/DAY 200 strip 3  . polyethylene glycol powder ([employer] PURELAX) 17 GM/SCOOP powder DISSOLVE 1 CAPFUL IN AT LEAST 8 OUNCES WATER/JUICE AND DRINK TWICE DAILY 1020 g 1  . sucralfate (CARAFATE) 1 GM/10ML suspension Take 10 mLs (1 g total) by mouth 4 (four) times daily.  420 mL 1  . valACYclovir (VALTREX) 500 MG tablet Take 500 mg by mouth daily.    . Vitamin D, Ergocalciferol, (DRISDOL) 1.25 MG (50000 UNIT) CAPS capsule Take 1 capsule (50,000 Units total) by mouth every 7 (seven) days. 4 capsule 0   No current facility-administered medications on file prior to visit.    Allergies  Allergen Reactions  . Gluten Meal Other (See Comments)    No wheat or soy  . Nsaids Nausea And Vomiting  and Other (See Comments)    Cannot tolerate large doses   . Soy Allergy   . Wheat Bran     Family History  Problem Relation Age of Onset  . Hypertension Mother   . Colon cancer Sister        dx in her early 51s  . Diabetes Sister   . Endometrial cancer Sister   . Hypertension Brother   . Other Father        2 collapsed lungs  . COPD Father   . Diabetes Father   . Heart attack Father   . Heart failure Father   . High blood pressure Father   . Colon polyps Neg Hx   . Esophageal cancer Neg Hx   . Stomach cancer Neg Hx     BP 134/76 (BP Location: Right Arm, Patient Position: Sitting, Cuff Size: Large)   Pulse 71   Ht 5' 5"  (1.651 m)   Wt 219 lb (99.3 kg)   SpO2 97%   BMI 36.44 kg/m       Review of Systems     Objective:   Physical Exam VITAL SIGNS:  See vs page GENERAL: no distress Pulses: dorsalis pedis intact bilat.   MSK: no deformity of the feet CV: trace bilat leg edema.  Skin:  no ulcer on the feet.  normal color and temp on the feet. Neuro: sensation is intact to touch on the feet   Lab Results  Component Value Date   HGBA1C 8.1 (A) 08/12/2020   Lab Results  Component Value Date   TSH 1.320 09/07/2020       Assessment & Plan:  Insulin-requiring type 2 DM: uncontrolled.   Nausea, due to Ozempic.  She wants to d/c it.   Patient Instructions  10 days after your last shot of Ozempic, please increase the insulin to 30 units with breakfast and 15 units with supper. check your blood sugar twice a day.  vary the time of day when you  check, between before the 3 meals, and at bedtime.  also check if you have symptoms of your blood sugar being too high or too low.  please keep a record of the readings and bring it to your next appointment here (or you can bring the meter itself).  You can write it on any piece of paper.  please call us sooner if your blood sugar goes below 70, or if you have a lot of readings over 200.  Please come back for a follow-up appointment in 2 months.

## 2020-10-01 NOTE — Patient Instructions (Addendum)
10 days after your last shot of Ozempic, please increase the insulin to 30 units with breakfast and 15 units with supper. check your blood sugar twice a day.  vary the time of day when you check, between before the 3 meals, and at bedtime.  also check if you have symptoms of your blood sugar being too high or too low.  please keep a record of the readings and bring it to your next appointment here (or you can bring the meter itself).  You can write it on any piece of paper.  please call us sooner if your blood sugar goes below 70, or if you have a lot of readings over 200.  Please come back for a follow-up appointment in 2 months.

## 2020-10-05 ENCOUNTER — Encounter (INDEPENDENT_AMBULATORY_CARE_PROVIDER_SITE_OTHER): Payer: Self-pay | Admitting: Bariatrics

## 2020-10-05 ENCOUNTER — Other Ambulatory Visit: Payer: Self-pay

## 2020-10-05 ENCOUNTER — Ambulatory Visit (INDEPENDENT_AMBULATORY_CARE_PROVIDER_SITE_OTHER): Payer: Medicare Other | Admitting: Bariatrics

## 2020-10-05 VITALS — BP 151/67 | HR 67 | Temp 98.1°F | Ht 65.0 in | Wt 214.0 lb

## 2020-10-05 DIAGNOSIS — E1169 Type 2 diabetes mellitus with other specified complication: Secondary | ICD-10-CM

## 2020-10-05 DIAGNOSIS — E669 Obesity, unspecified: Secondary | ICD-10-CM

## 2020-10-05 DIAGNOSIS — E559 Vitamin D deficiency, unspecified: Secondary | ICD-10-CM | POA: Diagnosis not present

## 2020-10-05 DIAGNOSIS — Z6841 Body Mass Index (BMI) 40.0 and over, adult: Secondary | ICD-10-CM | POA: Diagnosis not present

## 2020-10-05 MED ORDER — VITAMIN D (ERGOCALCIFEROL) 1.25 MG (50000 UNIT) PO CAPS: 50000.0000 [IU] | ORAL_CAPSULE | ORAL | 0 refills | Status: DC

## 2020-10-07 ENCOUNTER — Encounter: Payer: Self-pay | Admitting: Family Medicine

## 2020-10-07 ENCOUNTER — Other Ambulatory Visit: Payer: Self-pay

## 2020-10-07 ENCOUNTER — Ambulatory Visit (INDEPENDENT_AMBULATORY_CARE_PROVIDER_SITE_OTHER): Payer: Medicare Other | Admitting: Family Medicine

## 2020-10-07 VITALS — BP 140/78 | HR 88 | Temp 97.1°F | Ht 65.0 in | Wt 219.8 lb

## 2020-10-07 DIAGNOSIS — E1169 Type 2 diabetes mellitus with other specified complication: Secondary | ICD-10-CM | POA: Diagnosis not present

## 2020-10-07 DIAGNOSIS — E782 Mixed hyperlipidemia: Secondary | ICD-10-CM

## 2020-10-07 DIAGNOSIS — E1143 Type 2 diabetes mellitus with diabetic autonomic (poly)neuropathy: Secondary | ICD-10-CM | POA: Diagnosis not present

## 2020-10-07 DIAGNOSIS — K3184 Gastroparesis: Secondary | ICD-10-CM

## 2020-10-07 DIAGNOSIS — E1142 Type 2 diabetes mellitus with diabetic polyneuropathy: Secondary | ICD-10-CM | POA: Diagnosis not present

## 2020-10-07 DIAGNOSIS — E669 Obesity, unspecified: Secondary | ICD-10-CM | POA: Diagnosis not present

## 2020-10-07 DIAGNOSIS — Z780 Asymptomatic menopausal state: Secondary | ICD-10-CM

## 2020-10-07 DIAGNOSIS — I10 Essential (primary) hypertension: Secondary | ICD-10-CM | POA: Diagnosis not present

## 2020-10-07 DIAGNOSIS — K900 Celiac disease: Secondary | ICD-10-CM

## 2020-10-07 LAB — COMPREHENSIVE METABOLIC PANEL
ALT: 14 U/L (ref 0–35)
AST: 15 U/L (ref 0–37)
Albumin: 4 g/dL (ref 3.5–5.2)
Alkaline Phosphatase: 72 U/L (ref 39–117)
BUN: 23 mg/dL (ref 6–23)
CO2: 27 mEq/L (ref 19–32)
Calcium: 9.2 mg/dL (ref 8.4–10.5)
Chloride: 101 mEq/L (ref 96–112)
Creatinine, Ser: 1.04 mg/dL (ref 0.40–1.20)
GFR: 54.64 mL/min — ABNORMAL LOW (ref >60.00)
Glucose, Bld: 308 mg/dL — ABNORMAL HIGH (ref 70–99)
Potassium: 4.5 mEq/L (ref 3.5–5.1)
Sodium: 135 mEq/L (ref 135–145)
Total Bilirubin: 0.5 mg/dL (ref 0.2–1.2)
Total Protein: 6.9 g/dL (ref 6.0–8.3)

## 2020-10-07 LAB — LIPID PANEL
Cholesterol: 131 mg/dL (ref 0–200)
HDL: 52.5 mg/dL (ref >39.00)
LDL Cholesterol: 58 mg/dL (ref 0–99)
NonHDL: 78.08
Total CHOL/HDL Ratio: 2
Triglycerides: 101 mg/dL (ref 0.0–149.0)
VLDL: 20.2 mg/dL (ref 0.0–40.0)

## 2020-10-07 NOTE — Progress Notes (Signed)
Subjective  CC:  Chief Complaint  Patient presents with   Hypertension   Hyperlipidemia   Diabetes    HPI: Jessica [name] is a 70 y.o. female who presents to the office today to address the problems listed above in the chief complaint. Hypertension f/u: Control is good . Pt reports she is doing well. taking medications as instructed, no medication side effects noted, no TIAs, no chest pain on exertion, no dyspnea on exertion, no swelling of ankles. She denies adverse effects from his BP medications. Compliance with medication is good. When her blood pressure was taken she contributes her elevation to her phobia of snakes.  diabetes: Stopped ozempic recent secondary to not tolerating well-nausea and fatigue. She is now able to drink 2-3 cups of water before she could not because of appetite changes.  Managed by Dr. Loanne Drilling. Reviewed his recent notes. Last a1c mildly elevated above 8.  He has secondary gastroparesis and neuropathy.  Chronic meds are stable Fatigue-Occasionally feels tired throughout the day. Does not sleep well at night which causes her to fall asleep as soon. When these episodes occur her blood pressure within normal limits. She had to two sleep studies completed in the past and the results revealed that she is sleeping only 15 minutes at a time. Denies any lightheadedness or syncopal episodes. H/o sleep apnea, mild, not on cpap. Poor sleeper chronically LE edema-some ankle swelling but no other leg swelling. Taking Lasix 20 mg as needed. Tolerating well and compliant.  Mixed hyperlipidemia: Most recent LDL 74.  Goal less than 70.  On Lipitor 40 mg nightly.  Tolerates well.  Nonfasting for recheck today. Health Maintenance: Due for bone density scan in July.  Assessment  1. Type 2 diabetes mellitus with obesity ([place])   2. Diabetic polyneuropathy associated with type 2 diabetes mellitus ([place])   3. Combined hyperlipidemia associated with type 2 diabetes mellitus ([place])   4.  Essential hypertension   5. Diabetic gastroparesis ([place])   6. Celiac disease   7. Asymptomatic menopausal state      Plan   Hypertension f/u: BP control is fairly well controlled.  Continue current medications.  No changes today.  Check renal function Hyperlipidemia f/u: On high intensity statin.  Recheck lipids today.  We will push to call less than 70. Diabetes: Mildly worsening control.  Continue diabetic diet and recheck endocrinology in 3 months.  He may adjust medications at that time. Celiac disease and GERD: Continues to be managed by GI.  Improving since stopped Ozempic. Health maintenance: Ordered bone density scan for screening purposes.  Education regarding management of these chronic disease states was given. Management strategies discussed on successive visits include dietary and exercise recommendations, goals of achieving and maintaining IBW, and lifestyle modifications aiming for adequate sleep and minimizing stressors.   Follow up: Return in about 6 months (around 04/08/2021) for Follow up for complete physical exam .  Orders Placed This Encounter  Procedures   DG Bone Density   Lipid panel   Comprehensive metabolic panel    No orders of the defined types were placed in this encounter.     BP Readings from Last 3 Encounters:  10/07/20 140/78  10/05/20 (!) 151/67  10/01/20 134/76   Wt Readings from Last 3 Encounters:  10/07/20 219 lb 12.8 oz (99.7 kg)  10/05/20 214 lb (97.1 kg)  10/01/20 219 lb (99.3 kg)    Lab Results  Component Value Date   CHOL 145 03/11/2020  CHOL 134 10/16/2018   CHOL 133 10/25/2017   Lab Results  Component Value Date   HDL 58 03/11/2020   HDL 47.40 10/16/2018   HDL 40.60 10/25/2017   Lab Results  Component Value Date   LDLCALC 74 03/11/2020   LDLCALC 74 10/16/2018   LDLCALC 77 10/25/2017   Lab Results  Component Value Date   TRIG 62 03/11/2020   TRIG 64.0 10/16/2018   TRIG 75.0 10/25/2017   Lab Results  Component  Value Date   CHOLHDL 3 10/16/2018   CHOLHDL 3 10/25/2017   CHOLHDL 3 10/02/2016   Lab Results  Component Value Date   LDLDIRECT 143.2 11/23/2006   Lab Results  Component Value Date   CREATININE 0.96 03/11/2020   BUN 19 03/11/2020   NA 141 03/11/2020   K 4.8 03/11/2020   CL 102 03/11/2020   CO2 26 03/11/2020    The 10-year ASCVD risk score Mikey Bussing DC Jr., et al., 2013) is: 23.7%   Values used to calculate the score:     Age: 4 years     Sex: Female     Is Non-Hispanic African American: Yes     Diabetic: Yes     Tobacco smoker: No     Systolic Blood Pressure: 213 mmHg     Is BP treated: Yes     HDL Cholesterol: 58 mg/dL     Total Cholesterol: 145 mg/dL  I reviewed the patients updated PMH, FH, and SocHx.    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[place]) 04/05/2020    Priority: High   Celiac disease 02/14/2018    Priority: High   Diabetic neuropathy associated with type 2 diabetes mellitus ([place]) 02/14/2018    Priority: High   Class 2 obesity due to excess calories with body mass index (BMI) of 37.0 to 37.9 in adult 02/14/2018    Priority: High   Type 2 diabetes mellitus with complication, with long-term current use of insulin ([place]) 09/23/2010    Priority: High   Combined hyperlipidemia associated with type 2 diabetes mellitus ([place]) 01/29/2009    Priority: High   Essential hypertension 10/22/2006    Priority: High   Osteopenia 02/14/2018    Priority: Medium   Mild intermittent asthma 02/14/2018    Priority: Medium   Degenerative arthritis of knee, bilateral 10/29/2015    Priority: Medium   Degenerative cervical disc 07/27/2015    Priority: Medium   OSA (obstructive sleep apnea) 02/28/2011    Priority: Medium   Constipation, slow transit 09/23/2010    Priority: Medium   FH: colon cancer 09/23/2010    Priority: Medium   Personal history of colonic polyps 09/23/2010    Priority: Medium    Diabetic gastroparesis ([place]) 02/27/2007    Priority: Medium   Diabetic autonomic neuropathy associated with type 2 diabetes mellitus ([place]) 10/22/2006    Priority: Medium   Bilateral lower extremity edema 01/20/2020    Priority: Low   Fibroma of tongue 06/06/2016    Priority: Low   Renal calculi 01/13/2016    Priority: Low   Thyroid nodule 10/28/2019   Abnormal stress test    Family history of malignant neoplasm of endometrium 10/08/2018    Allergies: Gluten meal, Nsaids, Soy allergy, and Wheat bran  Social History: Patient  reports that she has never smoked. She has never used smokeless tobacco. She reports that she does not drink alcohol and does not  use drugs.  Current Meds  Medication Sig   albuterol (VENTOLIN HFA) 108 (90 Base) MCG/ACT inhaler Inhale 2 puffs into the lungs every 6 (six) hours as needed for wheezing or shortness of breath.   atorvastatin (LIPITOR) 40 MG tablet TAKE 1 TABLET BY MOUTH EVERY DAY   BD INSULIN SYRINGE U/F 31G X 5/16" 1 ML MISC USE UP TO 3 TIMES A DAY   dexlansoprazole (DEXILANT) 60 MG capsule Take 1 capsule (60 mg total) by mouth daily. D/c rx for lansoprazole; may try both generic and name brand dexilant rx Pt will pay out of pocket. Insurance will not cover   fluticasone (FLONASE) 50 MCG/ACT nasal spray Place 1 spray into both nostrils daily.   furosemide (LASIX) 20 MG tablet TAKE 1 TABLET (20 MG TOTAL) BY MOUTH DAILY AS NEEDED FOR FLUID OR EDEMA.   gabapentin (NEURONTIN) 600 MG tablet TAKE 1 TABLET IN THE MORNING AND 2 TABLETS AT BEDTIME   ibuprofen (ADVIL) 200 MG tablet Take 200 mg by mouth daily.   insulin NPH-regular Human (70-30) 100 UNIT/ML injection 30 units with breakfast and 15 units with supper.   ketoconazole (NIZORAL) 2 % cream APPLY TWICE A DAY FOR 2 WEEKS AS NEEDED FOR RASH   olmesartan (BENICAR) 40 MG tablet TAKE 1 TABLET BY MOUTH EVERY DAY   ONETOUCH ULTRA test strip USE 2 TIMES DAILY. AND LANCETS 2/DAY   polyethylene glycol powder  ([employer] PURELAX) 17 GM/SCOOP powder DISSOLVE 1 CAPFUL IN AT LEAST 8 OUNCES WATER/JUICE AND DRINK TWICE DAILY   sucralfate (CARAFATE) 1 GM/10ML suspension Take 10 mLs (1 g total) by mouth 4 (four) times daily.   valACYclovir (VALTREX) 500 MG tablet Take 500 mg by mouth daily.   Vitamin D, Ergocalciferol, (DRISDOL) 1.25 MG (50000 UNIT) CAPS capsule Take 1 capsule (50,000 Units total) by mouth every 7 (seven) days.    Review of Systems: Constitutional: positive fatigue Cardiovascular: negative for chest pain, palpitations, leg swelling, orthopnea Respiratory: negative for SOB, wheezing or persistent cough Gastrointestinal: negative for abdominal pain Genitourinary: negative for dysuria or gross hematuria  Objective  Vitals: BP 140/78   Pulse 88   Temp (!) 97.1 F (36.2 C) (Temporal)   Ht 5' 5"  (1.651 m)   Wt 219 lb 12.8 oz (99.7 kg)   SpO2 97%   BMI 36.58 kg/m  General: no acute distress  Psych:  Alert and oriented, normal mood and affect HEENT:  Normocephalic, atraumatic, supple neck  Cardiovascular:  RRR without murmur. no edema Respiratory:  Good breath sounds bilaterally, CTAB with normal respiratory effort Skin:  Warm, no rashes Neurologic:   Mental status is normal Commons side effects, risks, benefits, and alternatives for medications and treatment plan prescribed today were discussed, and the patient expressed understanding of the given instructions. Patient is instructed to call or message via MyChart if he/she has any questions or concerns regarding our treatment plan. No barriers to understanding were identified. We discussed Red Flag symptoms and signs in detail. Patient expressed understanding regarding what to do in case of urgent or emergency type symptoms.  Medication list was reconciled, printed and provided to the patient in AVS. Patient instructions and summary information was reviewed with the patient as documented in the AVS. This note was prepared with assistance of  Dragon voice recognition software. Occasional wrong-word or sound-a-like substitutions may have occurred due to the inherent limitations of voice recognition software  This visit occurred during the SARS-CoV-2 public health emergency.  Safety protocols were in  place, including screening questions prior to the visit, additional usage of staff PPE, and extensive cleaning of exam room while observing appropriate contact time as indicated for disinfecting solutions.    I,Alexis Bryant,acting as a Education administrator for [name], MD.,have documented all relevant documentation on the behalf of [name], MD,as directed by  [name], MD while in the presence of [name], MD.  I, [name], MD, have reviewed all documentation for this visit. The documentation on 10/07/20 for the exam, diagnosis, procedures, and orders are all accurate and complete.

## 2020-10-07 NOTE — Progress Notes (Signed)
error 

## 2020-10-07 NOTE — Patient Instructions (Signed)
Please return in 6 months for your annual complete physical; please come fasting.   I'll let you know how your cholesterol is doing and if we need to adjust your cholesterol medication.    If you have any questions or concerns, please don't hesitate to send me a message via MyChart or call the office at [phone]. Thank you for visiting with [place] today! It's our pleasure caring for you.   I have ordered a mammogram and/or bone density for you as we discussed today: []   Mammogram  [x]   Bone Density  Please call the office checked below to schedule your appointment:  [x]   [facility]      [address] [place], [place]         []   [facility]  [address] [place], [place]

## 2020-10-11 ENCOUNTER — Encounter (INDEPENDENT_AMBULATORY_CARE_PROVIDER_SITE_OTHER): Payer: Self-pay | Admitting: Bariatrics

## 2020-10-11 NOTE — Progress Notes (Signed)
Chief Complaint:   OBESITY Jessica [name] is here to discuss her progress with her obesity treatment plan along with follow-up of her obesity related diagnoses. Jessica [name] is on the Category 3 Plan and states she is following her eating plan approximately 90% of the time. Jessica [name] states she is doing 7,000 steps 7 times per week.  Today's visit was #: 20 Starting weight: 246 lbs Starting date: 03/12/2019 Today's weight: 214 lbs Today's date: 10/05/2020 Total lbs lost to date: 32 Total lbs lost since last in-office visit: 2   Interim History: Jessica [name] is down another 2 lbs and has done well overall. She has not been feeling well. She has been nauseous with the new Ozempic.  Subjective:   1. Vitamin D deficiency Jessica [name] is taking weekly prescription Vit D.  2. Type 2 diabetes mellitus with obesity (Jessica [name]) Jessica [name] is taking insulin NPH-regular.   Assessment/Plan:   1. Vitamin D deficiency Low Vitamin D level contributes to fatigue and are associated with obesity, breast, and colon cancer. She agrees to continue to take prescription Vitamin D @50 ,000 IU every week and will follow-up for routine testing of Vitamin D, at least 2-3 times per year to avoid over-replacement.  - Vitamin D, Ergocalciferol, (DRISDOL) 1.25 MG (50000 UNIT) CAPS capsule; Take 1 capsule (50,000 Units total) by mouth every 7 (seven) days.  Dispense: 4 capsule; Refill: 0  2. Type 2 diabetes mellitus with obesity (HCC) Good blood sugar control is important to decrease the likelihood of diabetic complications such as nephropathy, neuropathy, limb loss, blindness, coronary artery disease, and death. Intensive lifestyle modification including diet, exercise and weight loss are the first line of treatment for diabetes.  -Continue to decrease carbohydrates. -Work on diet and exercise.  3. Obesity with current BMI of 35.7  Jessica [name] is currently in the action stage of change. As such, her goal is to continue with weight loss efforts. She  has agreed to the Category 3 Plan.   Meal plan Mindful eating 09/07/2020 labs reviewed with pt.  Exercise goals:  As is  Behavioral modification strategies: increasing lean protein intake, decreasing simple carbohydrates, increasing vegetables, increasing water intake, decreasing eating out, no skipping meals, meal planning and cooking strategies, keeping healthy foods in the home, and planning for success.  Jessica [name] has agreed to follow-up with our clinic in 4 weeks. She was informed of the importance of frequent follow-up visits to maximize her success with intensive lifestyle modifications for her multiple health conditions.   Objective:   Blood pressure (!) 151/67, pulse 67, temperature 98.1 F (36.7 C), height 5' 5"  (1.651 m), weight 214 lb (97.1 kg), SpO2 96 %. Body mass index is 35.61 kg/m.  General: Cooperative, alert, well developed, in no acute distress. HEENT: Conjunctivae and lids unremarkable. Cardiovascular: Regular rhythm.  Lungs: Normal work of breathing. Neurologic: No focal deficits.   Lab Results  Component Value Date   CREATININE 1.04 10/07/2020   BUN 23 10/07/2020   NA 135 10/07/2020   K 4.5 10/07/2020   CL 101 10/07/2020   CO2 27 10/07/2020   Lab Results  Component Value Date   ALT 14 10/07/2020   AST 15 10/07/2020   ALKPHOS 72 10/07/2020   BILITOT 0.5 10/07/2020   Lab Results  Component Value Date   HGBA1C 8.1 (A) 08/12/2020   HGBA1C 7.8 (A) 05/14/2020   HGBA1C 7.1 (A) 12/31/2019   HGBA1C 6.9 (A) 10/28/2019   HGBA1C 6.7 (H) 07/24/2019   No results found for: INSULIN Lab  Results  Component Value Date   TSH 1.320 09/07/2020   Lab Results  Component Value Date   CHOL 131 10/07/2020   HDL 52.50 10/07/2020   LDLCALC 58 10/07/2020   LDLDIRECT 143.2 11/23/2006   TRIG 101.0 10/07/2020   CHOLHDL 2 10/07/2020   Lab Results  Component Value Date   WBC 4.8 10/28/2018   HGB 13.4 10/28/2018   HCT 42.6 09/07/2020   MCV 97 10/28/2018   PLT 260  10/28/2018   Lab Results  Component Value Date   IRON 113 09/07/2020   TIBC 276 09/07/2020   FERRITIN 149 09/07/2020    Obesity Behavioral Intervention:   Approximately 15 minutes were spent on the discussion below.  ASK: We discussed the diagnosis of obesity with [name] today and [name] agreed to give [place] permission to discuss obesity behavioral modification therapy today.  ASSESS: [name] has the diagnosis of obesity and her BMI today is 35.7. [name] is in the action stage of change.   ADVISE: [name] was educated on the multiple health risks of obesity as well as the benefit of weight loss to improve her health. She was advised of the need for long term treatment and the importance of lifestyle modifications to improve her current health and to decrease her risk of future health problems.  AGREE: Multiple dietary modification options and treatment options were discussed and [name] agreed to follow the recommendations documented in the above note.  ARRANGE: [name] was educated on the importance of frequent visits to treat obesity as outlined per CMS and USPSTF guidelines and agreed to schedule her next follow up appointment today.  Attestation Statements:   Reviewed by clinician on day of visit: allergies, medications, problem list, medical history, surgical history, family history, social history, and previous encounter notes.  Coral Ceo, CMA, am acting as Location manager for [employer], DO.  I have reviewed the above documentation for accuracy and completeness, and I agree with the above. [name], DO

## 2020-10-12 ENCOUNTER — Other Ambulatory Visit: Payer: Medicare Other

## 2020-10-20 ENCOUNTER — Other Ambulatory Visit: Payer: Self-pay | Admitting: Family Medicine

## 2020-10-20 DIAGNOSIS — E2839 Other primary ovarian failure: Secondary | ICD-10-CM

## 2020-10-25 ENCOUNTER — Ambulatory Visit
Admission: RE | Admit: 2020-10-25 | Discharge: 2020-10-25 | Disposition: A | Discharge status: Home / Self Care | Payer: Medicare Other | Source: Ambulatory Visit | Attending: Family Medicine | Admitting: Family Medicine

## 2020-10-25 ENCOUNTER — Other Ambulatory Visit: Payer: Self-pay

## 2020-10-25 DIAGNOSIS — E2839 Other primary ovarian failure: Secondary | ICD-10-CM

## 2020-10-26 ENCOUNTER — Other Ambulatory Visit: Payer: Self-pay | Admitting: Family Medicine

## 2020-11-01 ENCOUNTER — Other Ambulatory Visit (INDEPENDENT_AMBULATORY_CARE_PROVIDER_SITE_OTHER): Payer: Self-pay | Admitting: Bariatrics

## 2020-11-01 DIAGNOSIS — E559 Vitamin D deficiency, unspecified: Secondary | ICD-10-CM

## 2020-11-02 NOTE — Telephone Encounter (Signed)
Last OV with Dr Brown 

## 2020-11-03 ENCOUNTER — Ambulatory Visit (INDEPENDENT_AMBULATORY_CARE_PROVIDER_SITE_OTHER): Payer: Medicare Other | Admitting: Bariatrics

## 2020-11-03 ENCOUNTER — Other Ambulatory Visit: Payer: Self-pay

## 2020-11-03 ENCOUNTER — Encounter (INDEPENDENT_AMBULATORY_CARE_PROVIDER_SITE_OTHER): Payer: Self-pay | Admitting: Bariatrics

## 2020-11-03 VITALS — BP 145/86 | HR 74 | Temp 97.7°F | Ht 65.0 in | Wt 216.0 lb

## 2020-11-03 DIAGNOSIS — Z6841 Body Mass Index (BMI) 40.0 and over, adult: Secondary | ICD-10-CM

## 2020-11-03 DIAGNOSIS — E782 Mixed hyperlipidemia: Secondary | ICD-10-CM

## 2020-11-03 DIAGNOSIS — E1169 Type 2 diabetes mellitus with other specified complication: Secondary | ICD-10-CM | POA: Diagnosis not present

## 2020-11-03 DIAGNOSIS — E1142 Type 2 diabetes mellitus with diabetic polyneuropathy: Secondary | ICD-10-CM | POA: Diagnosis not present

## 2020-11-04 ENCOUNTER — Other Ambulatory Visit: Payer: Self-pay | Admitting: Endocrinology

## 2020-11-08 ENCOUNTER — Other Ambulatory Visit: Payer: Self-pay | Admitting: Internal Medicine

## 2020-11-08 NOTE — Progress Notes (Signed)
Chief Complaint:   OBESITY Akanksha is here to discuss her progress with her obesity treatment plan along with follow-up of her obesity related diagnoses. Kerly is on the Category 3 Plan and states she is following her eating plan approximately 90% of the time. Nate states she is using her cubii for 60 minutes 7 times per week.  Today's visit was #: 21 Starting weight: 246 lbs Starting date: 03/12/2019 Today's weight: 216 lbs Today's date: 11/03/2020 Total lbs lost to date: 30 lbs Total lbs lost since last in-office visit: 0  Interim History: Donie is up 2 lbs since her last visit. She struggles to sleep (suppose to have sleep study). She is not is skipping meals.  Subjective:   1. Diabetic polyneuropathy associated with type 2 diabetes mellitus (Isabela) Pleasant is taking Neurontin.   2. Combined hyperlipidemia associated with type 2 diabetes mellitus ([place]) Tykerria is taking Lipitor.  Assessment/Plan:   1. Diabetic polyneuropathy associated with type 2 diabetes mellitus (Griffith) Asyah will continue medications. She will continue to work on diet and exercise to keep blood sugar controlled. Good blood sugar control is important to decrease the likelihood of diabetic complications such as nephropathy, neuropathy, limb loss, blindness, coronary artery disease, and death. Intensive lifestyle modification including diet, exercise and weight loss are the first line of treatment for diabetes.   2. Combined hyperlipidemia associated with type 2 diabetes mellitus ([place]) Cardiovascular risk and specific lipid/LDL goals reviewed.  We discussed several lifestyle modifications today. Xan will continue Lipitor, and will continue to work on diet, exercise and weight loss efforts. She will limit her saturated fats. Orders and follow up as documented in patient record.   Counseling Intensive lifestyle modifications are the first line treatment for this issue. Dietary changes: Increase soluble  fiber. Decrease simple carbohydrates. Exercise changes: Moderate to vigorous-intensity aerobic activity 150 minutes per week if tolerated. Lipid-lowering medications: see documented in medical record.  3. Obesity with current BMI of 29 Alyha is currently in the action stage of change. As such, her goal is to continue with weight loss efforts. She has agreed to the Category 3 Plan.   Marbeth will continue meal planning.  She will continue intentional eating and she will continue to increase H2O intake.  Exercise goals:  As is.  Behavioral modification strategies: increasing lean protein intake, decreasing simple carbohydrates, increasing vegetables, increasing water intake, decreasing eating out, no skipping meals, meal planning and cooking strategies, keeping healthy foods in the home, and planning for success.  Karinne has agreed to follow-up with our clinic in 4 weeks. She was informed of the importance of frequent follow-up visits to maximize her success with intensive lifestyle modifications for her multiple health conditions.   Objective:   Blood pressure (!) 145/86, pulse 74, temperature 97.7 F (36.5 C), height 5' 5"  (1.651 m), weight 216 lb (98 kg), SpO2 99 %. Body mass index is 35.94 kg/m.  General: Cooperative, alert, well developed, in no acute distress. HEENT: Conjunctivae and lids unremarkable. Cardiovascular: Regular rhythm.  Lungs: Normal work of breathing. Neurologic: No focal deficits.   Lab Results  Component Value Date   CREATININE 1.04 10/07/2020   BUN 23 10/07/2020   NA 135 10/07/2020   K 4.5 10/07/2020   CL 101 10/07/2020   CO2 27 10/07/2020   Lab Results  Component Value Date   ALT 14 10/07/2020   AST 15 10/07/2020   ALKPHOS 72 10/07/2020   BILITOT 0.5 10/07/2020   Lab Results  Component Value Date   HGBA1C 8.1 (A) 08/12/2020   HGBA1C 7.8 (A) 05/14/2020   HGBA1C 7.1 (A) 12/31/2019   HGBA1C 6.9 (A) 10/28/2019   HGBA1C 6.7 (H) 07/24/2019   No  results found for: INSULIN Lab Results  Component Value Date   TSH 1.320 09/07/2020   Lab Results  Component Value Date   CHOL 131 10/07/2020   HDL 52.50 10/07/2020   LDLCALC 58 10/07/2020   LDLDIRECT 143.2 11/23/2006   TRIG 101.0 10/07/2020   CHOLHDL 2 10/07/2020   Lab Results  Component Value Date   VD25OH 85.0 09/07/2020   VD25OH 61.6 03/11/2020   VD25OH 57.2 07/24/2019   Lab Results  Component Value Date   WBC 4.8 10/28/2018   HGB 13.4 10/28/2018   HCT 42.6 09/07/2020   MCV 97 10/28/2018   PLT 260 10/28/2018   Lab Results  Component Value Date   IRON 113 09/07/2020   TIBC 276 09/07/2020   FERRITIN 149 09/07/2020    Obesity Behavioral Intervention:   Approximately 15 minutes were spent on the discussion below.  ASK: We discussed the diagnosis of obesity with [name] today and Terris agreed to give [place] permission to discuss obesity behavioral modification therapy today.  ASSESS: Gwynneth has the diagnosis of obesity and her BMI today is 36.0. Terrian is in the action stage of change.   ADVISE: Cassandr was educated on the multiple health risks of obesity as well as the benefit of weight loss to improve her health. She was advised of the need for long term treatment and the importance of lifestyle modifications to improve her current health and to decrease her risk of future health problems.  AGREE: Multiple dietary modification options and treatment options were discussed and Felicita agreed to follow the recommendations documented in the above note.  ARRANGE: Ysabela was educated on the importance of frequent visits to treat obesity as outlined per CMS and USPSTF guidelines and agreed to schedule her next follow up appointment today.  Attestation Statements:   Reviewed by clinician on day of visit: allergies, medications, problem list, medical history, surgical history, family history, social history, and previous encounter notes.  I, Lizbeth Bark, RMA, am acting as  Location manager for [employer], DO.   I have reviewed the above documentation for accuracy and completeness, and I agree with the above. [name], DO

## 2020-11-09 ENCOUNTER — Telehealth (INDEPENDENT_AMBULATORY_CARE_PROVIDER_SITE_OTHER): Payer: Self-pay

## 2020-11-09 ENCOUNTER — Encounter (INDEPENDENT_AMBULATORY_CARE_PROVIDER_SITE_OTHER): Payer: Self-pay | Admitting: Bariatrics

## 2020-11-09 NOTE — Telephone Encounter (Signed)
Please review

## 2020-11-10 ENCOUNTER — Encounter (INDEPENDENT_AMBULATORY_CARE_PROVIDER_SITE_OTHER): Payer: Self-pay | Admitting: Bariatrics

## 2020-11-11 ENCOUNTER — Ambulatory Visit: Payer: Medicare Other | Admitting: Podiatry

## 2020-11-18 ENCOUNTER — Ambulatory Visit (INDEPENDENT_AMBULATORY_CARE_PROVIDER_SITE_OTHER): Payer: Medicare Other | Admitting: Podiatry

## 2020-11-18 ENCOUNTER — Encounter: Payer: Self-pay | Admitting: Podiatry

## 2020-11-18 ENCOUNTER — Other Ambulatory Visit: Payer: Self-pay

## 2020-11-18 DIAGNOSIS — L600 Ingrowing nail: Secondary | ICD-10-CM

## 2020-11-18 DIAGNOSIS — E1142 Type 2 diabetes mellitus with diabetic polyneuropathy: Secondary | ICD-10-CM | POA: Diagnosis not present

## 2020-11-18 MED ORDER — NEOMYCIN-POLYMYXIN-HC 1 % OT SOLN: OTIC | 1 refills | Status: DC

## 2020-11-18 NOTE — Patient Instructions (Signed)

## 2020-11-20 NOTE — Progress Notes (Signed)
She presents today for her diabetic follow-up stating that she is not really having any problems and that her A1c has been doing pretty well recently.  She is really concerned about the medial border of the hallux right being as tender as it is and she has noticed that the bottom of her feet are starting to look more red lately.  She states that comes and goes.  Otherwise she states that the neuropathy is about the same.  Objective: Vital signs are stable alert oriented x3.  Pulses are palpable.  Neurologic sensorium is unchanged muscle strength is normal symmetrical bilateral.  Skin does not demonstrate any open lesions or wounds.  There is mild incurvation of the medial nail border hallux right with tenderness on palpation.  No purulence no malodor.  Assessment diabetes mellitus with diabetic peripheral neuropathy generally minimal complications from this.  Ingrown nail tibial border hallux right.  Plan: Discussed etiology pathology conservative versus surgical therapies.  At this point performed a chemical matricectomy to the tibial border of the hallux right after local anesthetic was administered.  She tolerated procedure well without complications.  We provided her with both oral and written home-going instruction for the care and soaking of the toe as well as a prescription for Cortisporin Otic to be applied twice daily after soaking.  He understands this is amendable to it.  I also discussed with her the reasons of her feet turning red.  Explained to her that this was neuropathy and the effects of an autonomic neuropathy she now understands and is amenable to it and will continue to watch her feet carefully.  I will follow-up with her in 2 weeks for postop check.

## 2020-11-28 ENCOUNTER — Other Ambulatory Visit: Payer: Self-pay | Admitting: Endocrinology

## 2020-12-01 ENCOUNTER — Encounter (INDEPENDENT_AMBULATORY_CARE_PROVIDER_SITE_OTHER): Payer: Self-pay | Admitting: Bariatrics

## 2020-12-01 ENCOUNTER — Ambulatory Visit (INDEPENDENT_AMBULATORY_CARE_PROVIDER_SITE_OTHER): Payer: Medicare Other | Admitting: Bariatrics

## 2020-12-01 ENCOUNTER — Other Ambulatory Visit: Payer: Self-pay

## 2020-12-01 VITALS — BP 148/86 | HR 77 | Temp 98.4°F | Ht 65.0 in | Wt 213.0 lb

## 2020-12-01 DIAGNOSIS — E1169 Type 2 diabetes mellitus with other specified complication: Secondary | ICD-10-CM | POA: Diagnosis not present

## 2020-12-01 DIAGNOSIS — E669 Obesity, unspecified: Secondary | ICD-10-CM

## 2020-12-01 DIAGNOSIS — Z6841 Body Mass Index (BMI) 40.0 and over, adult: Secondary | ICD-10-CM | POA: Diagnosis not present

## 2020-12-01 DIAGNOSIS — I10 Essential (primary) hypertension: Secondary | ICD-10-CM | POA: Diagnosis not present

## 2020-12-02 ENCOUNTER — Ambulatory Visit (INDEPENDENT_AMBULATORY_CARE_PROVIDER_SITE_OTHER): Payer: Medicare Other | Admitting: Podiatry

## 2020-12-02 DIAGNOSIS — L600 Ingrowing nail: Secondary | ICD-10-CM

## 2020-12-02 DIAGNOSIS — Z9889 Other specified postprocedural states: Secondary | ICD-10-CM

## 2020-12-02 NOTE — Progress Notes (Signed)
Chief Complaint:   OBESITY Jessica [name] is here to discuss her progress with her obesity treatment plan along with follow-up of her obesity related diagnoses. Jessica [name] is on the Category 3 Plan and states she is following her eating plan approximately 95% of the time. Jessica [name] states she is doing [place] for 60 minutes 7 times per week.  Today's visit was #: 22 Starting weight: 246 lbs Starting date: 03/12/2019 Today's weight: 213 lbs Today's date: 12/01/2020 Total lbs lost to date: 33 lbs Total lbs lost since last in-office visit: 3 lbs  Interim History: Jessica [name] is down 3 lbs since her last visit. She is getting adequate water.  Subjective:   1. Essential hypertension Jessica [name] is taking Benicar. Her hypertension is reasonably well controlled.  2. Type 2 diabetes mellitus with obesity (Jessica [name]) Jessica [name] is taking Humulin 70/30.  Assessment/Plan:   1. Essential hypertension Jessica [name] will continue taking her medications. She is working on healthy weight loss and exercise to improve blood pressure control. We will watch for signs of hypotension as she continues her lifestyle modifications.   2. Type 2 diabetes mellitus with obesity (Jessica [name]) Jessica [name] will continue taking her mediations.Good blood Jessica control is important to decrease the likelihood of diabetic complications such as nephropathy, neuropathy, limb loss, blindness, coronary artery disease, and death. Intensive lifestyle modification including diet, exercise and weight loss are the first line of treatment for diabetes.    3. Obesity with current BMI of 35.6 Jessica [name] is currently in the action stage of change. As such, her goal is to continue with weight loss efforts. She has agreed to the Category 3 Plan.   Jessica [name] will continue meal planning. She will continue with intentional eating. She will increase protein. She was given another scale to weight the food.  Exercise goals: No exercise has been prescribed at this time.  Behavioral  modification strategies: increasing lean protein intake, decreasing simple carbohydrates, increasing vegetables, increasing water intake, decreasing eating out, no skipping meals, meal planning and cooking strategies, keeping healthy foods in the home, and planning for success.  Jessica [name] has agreed to follow-up with our clinic in 4 weeks. She was informed of the importance of frequent follow-up visits to maximize her success with intensive lifestyle modifications for her multiple health conditions.   Objective:   Blood pressure (!) 148/86, pulse 77, temperature 98.4 F (36.9 C), height 5' 5"  (1.651 m), weight 213 lb (96.6 kg), SpO2 98 %. Body mass index is 35.45 kg/m.  General: Cooperative, alert, well developed, in no acute distress. HEENT: Conjunctivae and lids unremarkable. Cardiovascular: Regular rhythm.  Lungs: Normal work of breathing. Neurologic: No focal deficits.   Lab Results  Component Value Date   CREATININE 1.04 10/07/2020   BUN 23 10/07/2020   NA 135 10/07/2020   K 4.5 10/07/2020   CL 101 10/07/2020   CO2 27 10/07/2020   Lab Results  Component Value Date   ALT 14 10/07/2020   AST 15 10/07/2020   ALKPHOS 72 10/07/2020   BILITOT 0.5 10/07/2020   Lab Results  Component Value Date   HGBA1C 8.1 (A) 08/12/2020   HGBA1C 7.8 (A) 05/14/2020   HGBA1C 7.1 (A) 12/31/2019   HGBA1C 6.9 (A) 10/28/2019   HGBA1C 6.7 (H) 07/24/2019   No results found for: INSULIN Lab Results  Component Value Date   TSH 1.320 09/07/2020   Lab Results  Component Value Date   CHOL 131 10/07/2020   HDL 52.50 10/07/2020   LDLCALC 58 10/07/2020  LDLDIRECT 143.2 11/23/2006   TRIG 101.0 10/07/2020   CHOLHDL 2 10/07/2020   Lab Results  Component Value Date   VD25OH 85.0 09/07/2020   VD25OH 61.6 03/11/2020   VD25OH 57.2 07/24/2019   Lab Results  Component Value Date   WBC 4.8 10/28/2018   HGB 13.4 10/28/2018   HCT 42.6 09/07/2020   MCV 97 10/28/2018   PLT 260 10/28/2018   Lab  Results  Component Value Date   IRON 113 09/07/2020   TIBC 276 09/07/2020   FERRITIN 149 09/07/2020    Obesity Behavioral Intervention:   Approximately 15 minutes were spent on the discussion below.  ASK: We discussed the diagnosis of obesity with Jessica [name] today and Jessica [name] agreed to give [place] permission to discuss obesity behavioral modification therapy today.  ASSESS: Jessica [name] has the diagnosis of obesity and her BMI today is 35.6. Jessica [name] is in the action stage of change.   ADVISE: Jessica [name] was educated on the multiple health risks of obesity as well as the benefit of weight loss to improve her health. She was advised of the need for long term treatment and the importance of lifestyle modifications to improve her current health and to decrease her risk of future health problems.  AGREE: Multiple dietary modification options and treatment options were discussed and Jessica [name] agreed to follow the recommendations documented in the above note.  ARRANGE: Jessica [name] was educated on the importance of frequent visits to treat obesity as outlined per CMS and USPSTF guidelines and agreed to schedule her next follow up appointment today.  Attestation Statements:   Reviewed by clinician on day of visit: allergies, medications, problem list, medical history, surgical history, family history, social history, and previous encounter notes.  I, Jessica [name], RMA, am acting as Location manager for [employer], DO.   I have reviewed the above documentation for accuracy and completeness, and I agree with the above. Jessica Lesch, DO

## 2020-12-05 NOTE — Progress Notes (Signed)
She presents today for follow-up of her matrixectomy right foot.  States she still gets a few shooting pains now and again but all in all seems to be doing pretty well she still soaking and using a Band-Aid.  Objective: There is no erythema edema cellulitis drainage odor appears to be healing very nicely.  Assessment: Well-healing surgical toe.  Plan: I think we should go ahead and allow her to start soaking Epson salts and warm water every other day still cover during the daytime but leave open at bedtime probably only for about another week to 2 weeks.  Follow-up with me questions or concerns

## 2020-12-06 ENCOUNTER — Encounter (INDEPENDENT_AMBULATORY_CARE_PROVIDER_SITE_OTHER): Payer: Self-pay | Admitting: Bariatrics

## 2020-12-28 ENCOUNTER — Telehealth (INDEPENDENT_AMBULATORY_CARE_PROVIDER_SITE_OTHER): Payer: Medicare Other | Admitting: Endocrinology

## 2020-12-28 ENCOUNTER — Other Ambulatory Visit: Payer: Self-pay

## 2020-12-28 VITALS — Ht 65.0 in | Wt 214.0 lb

## 2020-12-28 DIAGNOSIS — E118 Type 2 diabetes mellitus with unspecified complications: Secondary | ICD-10-CM | POA: Diagnosis not present

## 2020-12-28 DIAGNOSIS — Z794 Long term (current) use of insulin: Secondary | ICD-10-CM

## 2020-12-28 MED ORDER — HUMULIN 70/30 (70-30) 100 UNIT/ML ~~LOC~~ SUSP: SUBCUTANEOUS | 8 refills | Status: DC

## 2020-12-28 NOTE — Patient Instructions (Addendum)
Please change the insulin to 40 units with breakfast, and 8 units with supper.   check your blood sugar twice a day.  vary the time of day when you check, between before the 3 meals, and at bedtime.  also check if you have symptoms of your blood sugar being too high or too low.  please keep a record of the readings and bring it to your next appointment here (or you can bring the meter itself).  You can write it on any piece of paper.  please call us sooner if your blood sugar goes below 70, or if you have a lot of readings over 200.   Please come in for an A1c.   Please come back for a follow-up appointment in 2-3 months.

## 2020-12-28 NOTE — Progress Notes (Signed)
Subjective:    Patient ID: Jessica [name], female    DOB: 08-09-1950, 70 y.o.   MRN: [id number]  HPI telehealth visit today via video visit.  Alternatives to telehealth are presented to this patient, and the patient agrees to the telehealth visit. Pt is advised of the cost of the visit, and agrees to this, also.   Patient is at home, and I am at the office.   Persons attending the telehealth visit: the patient and I Pt returns for f/u of diabetes mellitus:  DM type: Insulin-requiring type 2. Dx'ed: 2000.  Complications: PN, GP, and AN.   Therapy: insulin since 2005 GDM: never DKA: never Severe hypoglycemia: never.  Pancreatitis: never.  SDOH: she takes human insulin, due to cost; her ability to care for her DM is compromised by her husband's illness.    Other: she has done better on a BID insulin regimen; she did not tolerate Ozempic (nausea) Interval history: pt states she feels well in general.  She has hypoglycemia approx once per month, and these episodes are mild.  This happens in the middle of the night.  Pt says she never misses the insulin.  pt states cbg's vary [id number].   Pt also has MNG (dx'ed 2020; [place] in 2021 was unchanged; RLP nodule needs surveillance).    Past Medical History:  Diagnosis Date   Anxiety    Arthritis    bilateral knees   Asthma    childhood   Autonomic neuropathy    diabetic   Cataract    Celiac disease    Constipation    Depression    Diabetes mellitus    type II, Hemoglobin A1C 9.9 10/05/2011   Diabetic neuropathy ([place])    Diverticulosis    Fatty liver    Gastroparesis    GERD (gastroesophageal reflux disease)    Hyperlipidemia    Hypertension    IBS (irritable bowel syndrome)    Kidney stones    Personal history of colonic polyps 03/2010   hyperplastic   Shingles 2021    Past Surgical History:  Procedure Laterality Date   APPENDECTOMY     CATARACT EXTRACTION Right 04/29/2018   CATARACT EXTRACTION Left 03/2018   COLONOSCOPY      DILATATION & CURRETTAGE/HYSTEROSCOPY WITH RESECTOCOPE N/A 03/24/2013   Procedure: DILATATION & CURETTAGE, HYSTEROSCOPY WITH RESECTION;  Surgeon: Olga Millers, MD;  Location: [facility];  Service: Gynecology;  Laterality: N/A;   HYSTEROSCOPY WITH D & C N/A 11/08/2015   Procedure: DILATATION AND CURETTAGE /HYSTEROSCOPY;  Surgeon: Olga Millers, MD;  Location: [facility];  Service: Gynecology;  Laterality: N/A;   left breast cyst removal  2000   LEFT HEART CATH AND CORONARY ANGIOGRAPHY N/A 10/31/2018   Procedure: LEFT HEART CATH AND CORONARY ANGIOGRAPHY;  Surgeon: Jettie Booze, MD;  Location: [facility];  Service: Cardiovascular;  Laterality: N/A;   TUBAL LIGATION      Social History   Socioeconomic History   Marital status: Married    Spouse name: Devar Dechellis   Number of children: 2   Years of education: Not on file   Highest education level: Not on file  Occupational History   Occupation: Scientist, 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   Occupation: retired  Tobacco Use   Smoking status: Never   Smokeless tobacco: Never  Vaping Use   Vaping Use: Never used  Substance and Sexual Activity   Alcohol use: No  Drug use: No   Sexual activity: Yes    Birth control/protection: Post-menopausal, Surgical  Other Topics Concern   Not on file  Social History Narrative   Not on file   Social Determinants of Health   Financial Resource Strain: Low Risk    Difficulty of Paying Living Expenses: Not hard at all  Food Insecurity: No Food Insecurity   Worried About Charity fundraiser in the Last Year: Never true   [place] in the Last Year: Never true  Transportation Needs: No Transportation Needs   Lack of Transportation (Medical): No   Lack of Transportation (Non-Medical): No  Physical Activity: Insufficiently Active   Days of Exercise per Week: 2 days   Minutes of Exercise per Session: 60 min  Stress: Stress Concern Present   Feeling  of Stress : To some extent  Social Connections: Moderately Integrated   Frequency of Communication with Friends and Family: More than three times a week   Frequency of Social Gatherings with Friends and Family: Never   Attends Religious Services: More than 4 times per year   Active Member of [employer] or Organizations: No   Attends Music therapist: Never   Marital Status: Married  Human resources officer Violence: Not At Risk   Fear of Current or Ex-Partner: No   Emotionally Abused: No   Physically Abused: No   Sexually Abused: No    Current Outpatient Medications on File Prior to Visit  Medication Sig Dispense Refill   albuterol (VENTOLIN HFA) 108 (90 Base) MCG/ACT inhaler Inhale 2 puffs into the lungs every 6 (six) hours as needed for wheezing or shortness of breath. 8 g 3   atorvastatin (LIPITOR) 40 MG tablet TAKE 1 TABLET BY MOUTH EVERY DAY 90 tablet 3   BD INSULIN SYRINGE U/F 31G X 5/16" 1 ML MISC USE UP TO 3 TIMES A DAY 100 each 5   dexlansoprazole (DEXILANT) 60 MG capsule Take 1 capsule (60 mg total) by mouth daily. D/c rx for lansoprazole; may try both generic and name brand dexilant rx Pt will pay out of pocket. Insurance will not cover 30 capsule 3   fluticasone (FLONASE) 50 MCG/ACT nasal spray Place 1 spray into both nostrils daily. 16 g 6   furosemide (LASIX) 20 MG tablet TAKE 1 TABLET (20 MG TOTAL) BY MOUTH DAILY AS NEEDED FOR FLUID OR EDEMA. 90 tablet 3   gabapentin (NEURONTIN) 600 MG tablet TAKE 1 TABLET IN THE MORNING AND 2 TABLETS AT BEDTIME 270 tablet 2   ibuprofen (ADVIL) 200 MG tablet Take 200 mg by mouth daily.     ketoconazole (NIZORAL) 2 % cream APPLY TWICE A DAY FOR 2 WEEKS AS NEEDED FOR RASH 30 g 2   NEOMYCIN-POLYMYXIN-HYDROCORTISONE (CORTISPORIN) 1 % SOLN OTIC solution Apply 1-2 drops to toe BID after soaking 10 mL 1   olmesartan (BENICAR) 40 MG tablet TAKE 1 TABLET BY MOUTH EVERY DAY 90 tablet 3   ONETOUCH ULTRA test strip USE 2 TIMES DAILY. AND LANCETS  2/DAY 200 strip 3   polyethylene glycol powder ([facility] PURELAX) 17 GM/SCOOP powder DISSOLVE 1 CAPFUL IN AT LEAST 8 OUNCES WATER/JUICE AND DRINK TWICE DAILY 1020 g 1   sucralfate (CARAFATE) 1 GM/10ML suspension Take 10 mLs (1 g total) by mouth 4 (four) times daily. 420 mL 1   valACYclovir (VALTREX) 500 MG tablet TAKE 1 TABLET BY MOUTH EVERY DAY 90 tablet 3   Vitamin D, Ergocalciferol, (DRISDOL) 1.25 MG (50000 UNIT) CAPS  capsule Take 1 capsule (50,000 Units total) by mouth every 7 (seven) days. 4 capsule 0   No current facility-administered medications on file prior to visit.    Allergies  Allergen Reactions   Gluten Meal Other (See Comments)    No wheat or soy   Nsaids Nausea And Vomiting and Other (See Comments)    Cannot tolerate large doses    Soy Allergy    Wheat Bran     Family History  Problem Relation Age of Onset   Hypertension Mother    Colon cancer Sister        dx in her early 50s   Diabetes Sister    Endometrial cancer Sister    Hypertension Brother    Other Father        2 collapsed lungs   COPD Father    Diabetes Father    Heart attack Father    Heart failure Father    High blood pressure Father    Colon polyps Neg Hx    Esophageal cancer Neg Hx    Stomach cancer Neg Hx     Ht 5' 5"  (1.651 m)   Wt 214 lb (97.1 kg)   BMI 35.61 kg/m    Review of Systems     Objective:   Physical Exam      Assessment & Plan:  Insulin-requiring type 2 DM. Hypoglycemia, due to insulin  Patient Instructions  Please change the insulin to 40 units with breakfast, and 8 units with supper.   check your blood sugar twice a day.  vary the time of day when you check, between before the 3 meals, and at bedtime.  also check if you have symptoms of your blood sugar being too high or too low.  please keep a record of the readings and bring it to your next appointment here (or you can bring the meter itself).  You can write it on any piece of paper.  please call us sooner if your  blood sugar goes below 70, or if you have a lot of readings over 200.   Please come in for an A1c.   Please come back for a follow-up appointment in 2-3 months.

## 2020-12-29 ENCOUNTER — Ambulatory Visit (INDEPENDENT_AMBULATORY_CARE_PROVIDER_SITE_OTHER): Payer: Medicare Other | Admitting: Bariatrics

## 2021-01-05 ENCOUNTER — Other Ambulatory Visit: Payer: Self-pay | Admitting: Physician Assistant

## 2021-01-12 ENCOUNTER — Encounter (INDEPENDENT_AMBULATORY_CARE_PROVIDER_SITE_OTHER): Payer: Self-pay | Admitting: Bariatrics

## 2021-01-12 ENCOUNTER — Ambulatory Visit (INDEPENDENT_AMBULATORY_CARE_PROVIDER_SITE_OTHER): Payer: Medicare Other | Admitting: Bariatrics

## 2021-01-12 ENCOUNTER — Other Ambulatory Visit: Payer: Self-pay

## 2021-01-12 ENCOUNTER — Ambulatory Visit (INDEPENDENT_AMBULATORY_CARE_PROVIDER_SITE_OTHER): Payer: Medicare Other

## 2021-01-12 VITALS — BP 145/82 | HR 77 | Temp 98.1°F | Ht 65.0 in | Wt 213.0 lb

## 2021-01-12 DIAGNOSIS — K3184 Gastroparesis: Secondary | ICD-10-CM

## 2021-01-12 DIAGNOSIS — E669 Obesity, unspecified: Secondary | ICD-10-CM

## 2021-01-12 DIAGNOSIS — Z794 Long term (current) use of insulin: Secondary | ICD-10-CM

## 2021-01-12 DIAGNOSIS — E118 Type 2 diabetes mellitus with unspecified complications: Secondary | ICD-10-CM | POA: Diagnosis not present

## 2021-01-12 DIAGNOSIS — Z6841 Body Mass Index (BMI) 40.0 and over, adult: Secondary | ICD-10-CM

## 2021-01-12 DIAGNOSIS — I10 Essential (primary) hypertension: Secondary | ICD-10-CM | POA: Diagnosis not present

## 2021-01-12 DIAGNOSIS — E1143 Type 2 diabetes mellitus with diabetic autonomic (poly)neuropathy: Secondary | ICD-10-CM

## 2021-01-12 DIAGNOSIS — E1169 Type 2 diabetes mellitus with other specified complication: Secondary | ICD-10-CM | POA: Diagnosis not present

## 2021-01-12 LAB — POCT GLYCOSYLATED HEMOGLOBIN (HGB A1C): Hemoglobin A1C: 8.2 % — AB (ref 4.0–5.6)

## 2021-01-12 NOTE — Progress Notes (Signed)
Pt came in for a nurse visit for an A1c check.   A1c was 8.2

## 2021-01-12 NOTE — Progress Notes (Signed)
Chief Complaint:   Jessica [name] Jessica [name] is here to discuss her progress with her Jessica [name] treatment plan along with follow-up of her Jessica [name] related diagnoses. Jessica [name] is on the Category 3 Plan and states she is following her eating plan approximately 90% of the time. Jessica [name] states she is doing [place] for 60 minutes 7 times per week.  Today's visit was #: 23 Starting weight: 246 lbs Starting date: 03/12/2019 Today's weight: 213 lbs Today's date: 01/12/2021 Total lbs lost to date: 33 lbs Total lbs lost since last in-office visit: 0  Interim History: Jessica [name]'s weight remains the same. She went to [facility] and was not as  vigilant with her diet.  Subjective:   1. Type 2 diabetes mellitus with Jessica [name] (Jessica [name]) Jessica [name] is taking Humulin 70/30 40 units with breakfast and 8 with supper. Her FBS was 200. Her last A1C was 8.2.  2. Diabetic gastroparesis (Jessica [name]) Jessica [name] is currently taking Carafate, Glycolax/Miralax.  3. Essential hypertension Jessica [name]'s hypertension is controlled.  Assessment/Plan:   1. Type 2 diabetes mellitus with Jessica [name] (Jessica [name]) Jessica [name] will continue medications. She was given a handout on Metformin to consider adding to better control her diabetes.Good blood sugar control is important to decrease the likelihood of diabetic complications such as nephropathy, neuropathy, limb loss, blindness, coronary artery disease, and death. Intensive lifestyle modification including diet, exercise and weight loss are the first line of treatment for diabetes.   2. Diabetic gastroparesis (Jessica [name]) Jessica [name] will continue medication.  3. Essential hypertension Jessica [name] will continue medicaitons. She is working on healthy weight loss and exercise to improve blood pressure control. We will watch for signs of hypotension as she continues her lifestyle modifications.  4. Jessica [name] with current BMI of 35.5 Jessica [name] is currently in the action stage of change. As such, her goal is to continue with weight loss efforts.  She has agreed to the Category 3 Plan.   Jessica [name] will continue to adhere closely to the plan. She will increase her H2O.  Exercise goals:  Jessica [name] will do more walking.  Behavioral modification strategies: increasing lean protein intake, decreasing simple carbohydrates, increasing vegetables, increasing water intake, decreasing eating out, no skipping meals, meal planning and cooking strategies, keeping healthy foods in the home, ways to avoid boredom eating, and planning for success.  Jessica [name] has agreed to follow-up with our clinic in 3-4 weeks. She was informed of the importance of frequent follow-up visits to maximize her success with intensive lifestyle modifications for her multiple health conditions.   Objective:   Blood pressure (!) 145/82, pulse 77, temperature 98.1 F (36.7 C), height 5' 5"  (1.651 m), weight 213 lb (96.6 kg), SpO2 96 %. Body mass index is 35.45 kg/m.  General: Cooperative, alert, well developed, in no acute distress. HEENT: Conjunctivae and lids unremarkable. Cardiovascular: Regular rhythm.  Lungs: Normal work of breathing. Neurologic: No focal deficits.   Lab Results  Component Value Date   CREATININE 1.04 10/07/2020   BUN 23 10/07/2020   NA 135 10/07/2020   K 4.5 10/07/2020   CL 101 10/07/2020   CO2 27 10/07/2020   Lab Results  Component Value Date   ALT 14 10/07/2020   AST 15 10/07/2020   ALKPHOS 72 10/07/2020   BILITOT 0.5 10/07/2020   Lab Results  Component Value Date   HGBA1C 8.2 (A) 01/12/2021   HGBA1C 8.1 (A) 08/12/2020   HGBA1C 7.8 (A) 05/14/2020   HGBA1C 7.1 (A) 12/31/2019   HGBA1C 6.9 (A) 10/28/2019   No results found for:  INSULIN Lab Results  Component Value Date   TSH 1.320 09/07/2020   Lab Results  Component Value Date   CHOL 131 10/07/2020   HDL 52.50 10/07/2020   LDLCALC 58 10/07/2020   LDLDIRECT 143.2 11/23/2006   TRIG 101.0 10/07/2020   CHOLHDL 2 10/07/2020   Lab Results  Component Value Date   VD25OH 85.0  09/07/2020   VD25OH 61.6 03/11/2020   VD25OH 57.2 07/24/2019   Lab Results  Component Value Date   WBC 4.8 10/28/2018   HGB 13.4 10/28/2018   HCT 42.6 09/07/2020   MCV 97 10/28/2018   PLT 260 10/28/2018   Lab Results  Component Value Date   IRON 113 09/07/2020   TIBC 276 09/07/2020   FERRITIN 149 09/07/2020    Jessica [name] Behavioral Intervention:   Approximately 15 minutes were spent on the discussion below.  ASK: We discussed the diagnosis of Jessica [name] with Jessica [name] today and Jessica [name] agreed to give [place] permission to discuss Jessica [name] behavioral modification therapy today.  ASSESS: Jessica [name] has the diagnosis of Jessica [name] and her BMI today is 35.5. Jessica [name] is in the action stage of change.   ADVISE: Jessica [name] was educated on the multiple health risks of Jessica [name] as well as the benefit of weight loss to improve her health. She was advised of the need for long term treatment and the importance of lifestyle modifications to improve her current health and to decrease her risk of future health problems.  AGREE: Multiple dietary modification options and treatment options were discussed and Jessica [name] agreed to follow the recommendations documented in the above note.  ARRANGE: Jessica [name] was educated on the importance of frequent visits to treat Jessica [name] as outlined per CMS and USPSTF guidelines and agreed to schedule her next follow up appointment today.  Attestation Statements:   Reviewed by clinician on day of visit: allergies, medications, problem list, medical history, surgical history, family history, social history, and previous encounter notes.  I, Jessica [name], RMA, am acting as Location manager for [employer], DO.   I have reviewed the above documentation for accuracy and completeness, and I agree with the above. Jessica Lesch, DO

## 2021-01-13 ENCOUNTER — Encounter (INDEPENDENT_AMBULATORY_CARE_PROVIDER_SITE_OTHER): Payer: Self-pay | Admitting: Bariatrics

## 2021-01-19 NOTE — Telephone Encounter (Signed)
error 

## 2021-02-09 ENCOUNTER — Other Ambulatory Visit: Payer: Self-pay

## 2021-02-09 ENCOUNTER — Encounter: Payer: Self-pay | Admitting: Endocrinology

## 2021-02-09 ENCOUNTER — Ambulatory Visit (INDEPENDENT_AMBULATORY_CARE_PROVIDER_SITE_OTHER): Payer: Medicare Other | Admitting: Bariatrics

## 2021-02-09 ENCOUNTER — Encounter (INDEPENDENT_AMBULATORY_CARE_PROVIDER_SITE_OTHER): Payer: Self-pay | Admitting: Bariatrics

## 2021-02-09 VITALS — BP 154/75 | HR 69 | Temp 98.0°F | Ht 65.0 in | Wt 214.0 lb

## 2021-02-09 DIAGNOSIS — E1169 Type 2 diabetes mellitus with other specified complication: Secondary | ICD-10-CM

## 2021-02-09 DIAGNOSIS — K900 Celiac disease: Secondary | ICD-10-CM | POA: Diagnosis not present

## 2021-02-09 DIAGNOSIS — Z6841 Body Mass Index (BMI) 40.0 and over, adult: Secondary | ICD-10-CM

## 2021-02-09 DIAGNOSIS — E669 Obesity, unspecified: Secondary | ICD-10-CM | POA: Diagnosis not present

## 2021-02-09 MED ORDER — OZEMPIC (0.25 OR 0.5 MG/DOSE) 2 MG/1.5ML ~~LOC~~ SOPN: 0.2500 mg | PEN_INJECTOR | SUBCUTANEOUS | 0 refills | Status: DC

## 2021-02-09 NOTE — Progress Notes (Signed)
Chief Complaint:   OBESITY Jessica [name] is here to discuss her progress with her obesity treatment plan along with follow-up of her obesity related diagnoses. Jessica [name] is on the Category 3 Plan and states she is following her eating plan approximately 95% of the time. Jessica [name] states she is doing [place] for 60 minutes 7 times per week.  Today's visit was #: 24 Starting weight: 246 lbs Starting date: 03/12/2019 Today's weight: 214 lbs Today's date: 02/09/2021 Total lbs lost to date: 32 lbs Total lbs lost since last in-office visit: 0  Interim History: Jessica [name] is up 1 lb since her last visit. She is getting slightly discouraged about lack of weight loss.  Subjective:   1. Type 2 diabetes mellitus with obesity (Jessica [name]) Jessica [name] is taking Humulin. Her fasting blood sugar was 263. The lowest was 95.  2. Celiac disease Jessica [name] notes craving cakes.  Assessment/Plan:   1. Type 2 diabetes mellitus with obesity ([place]) [name] will continue Humulin. We will refill Ozempic 0.25 with no refills.Good blood sugar control is important to decrease the likelihood of diabetic complications such as nephropathy, neuropathy, limb loss, blindness, coronary artery disease, and death. Intensive lifestyle modification including diet, exercise and weight loss are the first line of treatment for diabetes.   - Semaglutide,0.25 or 0.5MG/DOS, (OZEMPIC, 0.25 OR 0.5 MG/DOSE,) 2 MG/1.5ML SOPN; Inject 0.25 mg into the skin once a week.  Dispense: 1.5 mL; Refill: 0  2. Celiac disease Jessica [name] will have a small amount of gluten free cake.  3. Obesity with current BMI of 35.7 Jessica [name] is currently in the action stage of change. As such, her goal is to continue with weight loss efforts. She has agreed to the Category 2 Plan and the Jessica [name].   Jessica [name] will continue meal planning. She will continue to adhere closely to the plan. She switched plan Category 3 to Category 2 and Jessica [name].  Exercise goals:  As is.  Behavioral  modification strategies: increasing lean protein intake, decreasing simple carbohydrates, increasing vegetables, increasing water intake, decreasing eating out, no skipping meals, meal planning and cooking strategies, keeping healthy foods in the home, and planning for success.  Jessica [name] has agreed to follow-up with our clinic in 4 weeks. She was informed of the importance of frequent follow-up visits to maximize her success with intensive lifestyle modifications for her multiple health conditions.   Objective:   Blood pressure (!) 154/75, pulse 69, temperature 98 F (36.7 C), height 5' 5"  (1.651 m), weight 214 lb (97.1 kg), SpO2 99 %. Body mass index is 35.61 kg/m.  General: Cooperative, alert, well developed, in no acute distress. HEENT: Conjunctivae and lids unremarkable. Cardiovascular: Regular rhythm.  Lungs: Normal work of breathing. Neurologic: No focal deficits.   Lab Results  Component Value Date   CREATININE 1.04 10/07/2020   BUN 23 10/07/2020   NA 135 10/07/2020   K 4.5 10/07/2020   CL 101 10/07/2020   CO2 27 10/07/2020   Lab Results  Component Value Date   ALT 14 10/07/2020   AST 15 10/07/2020   ALKPHOS 72 10/07/2020   BILITOT 0.5 10/07/2020   Lab Results  Component Value Date   HGBA1C 8.2 (A) 01/12/2021   HGBA1C 8.1 (A) 08/12/2020   HGBA1C 7.8 (A) 05/14/2020   HGBA1C 7.1 (A) 12/31/2019   HGBA1C 6.9 (A) 10/28/2019   No results found for: INSULIN Lab Results  Component Value Date   TSH 1.320 09/07/2020   Lab Results  Component Value Date  CHOL 131 10/07/2020   HDL 52.50 10/07/2020   LDLCALC 58 10/07/2020   LDLDIRECT 143.2 11/23/2006   TRIG 101.0 10/07/2020   CHOLHDL 2 10/07/2020   Lab Results  Component Value Date   VD25OH 85.0 09/07/2020   VD25OH 61.6 03/11/2020   VD25OH 57.2 07/24/2019   Lab Results  Component Value Date   WBC 4.8 10/28/2018   HGB 13.4 10/28/2018   HCT 42.6 09/07/2020   MCV 97 10/28/2018   PLT 260 10/28/2018   Lab  Results  Component Value Date   IRON 113 09/07/2020   TIBC 276 09/07/2020   FERRITIN 149 09/07/2020    Obesity Behavioral Intervention:   Approximately 15 minutes were spent on the discussion below.  ASK: We discussed the diagnosis of obesity with [name] today and [name] to give [place] permission to discuss obesity behavioral modification therapy today.  ASSESS: [name] has the diagnosis of obesity and her BMI today is 35.7. [name] is in the action stage of change.   ADVISE: [name] was educated on the multiple health risks of obesity as well as the benefit of weight loss to improve her health. She was advised of the need for long term treatment and the importance of lifestyle modifications to improve her current health and to decrease her risk of future health problems.  AGREE: Multiple dietary modification options and treatment options were discussed and [name] agreed to follow the recommendations documented in the above note.  ARRANGE: [name] was educated on the importance of frequent visits to treat obesity as outlined per CMS and USPSTF guidelines and agreed to schedule her next follow up appointment today.  Attestation Statements:   Reviewed by clinician on day of visit: allergies, medications, problem list, medical history, surgical history, family history, social history, and previous encounter notes.  I, Lizbeth Bark, RMA, am acting as Location manager for [employer], DO.   I have reviewed the above documentation for accuracy and completeness, and I agree with the above. [name], DO

## 2021-02-10 ENCOUNTER — Encounter (INDEPENDENT_AMBULATORY_CARE_PROVIDER_SITE_OTHER): Payer: Self-pay | Admitting: Bariatrics

## 2021-02-24 ENCOUNTER — Ambulatory Visit (INDEPENDENT_AMBULATORY_CARE_PROVIDER_SITE_OTHER): Payer: Medicare Other

## 2021-02-24 DIAGNOSIS — Z0000 Encounter for general adult medical examination without abnormal findings: Secondary | ICD-10-CM | POA: Diagnosis not present

## 2021-02-24 DIAGNOSIS — Z599 Problem related to housing and economic circumstances, unspecified: Secondary | ICD-10-CM | POA: Diagnosis not present

## 2021-02-24 NOTE — Addendum Note (Signed)
Addended by: [name] on: 02/24/2021 02:50 PM   Modules accepted: Orders

## 2021-02-24 NOTE — Patient Instructions (Signed)
Jessica [name] , Thank you for taking time to come for your Medicare Wellness Visit. I appreciate your ongoing commitment to your health goals. Please review the following plan we discussed and let me know if I can assist you in the future.   Screening recommendations/referrals: Colonoscopy: Done 10/14/19 repeat every 5 years  Mammogram: Done 05/01/20 repeat every year  Bone Density: Done 10/25/20 repeat every 2 years  Recommended yearly ophthalmology/optometry visit for glaucoma screening and checkup Recommended yearly dental visit for hygiene and checkup  Vaccinations: Influenza vaccine: Due and discussed  Pneumococcal vaccine: Up to date Tdap vaccine: Done 10/25/17 repeat every 10 years  Shingles vaccine: Completed 7/7 & 01/28/19   Covid-19:Completed 3/4, 08/06/19 & 09/02/20  Advanced directives: Please bring a copy of your health care power of attorney and living will to the office at your convenience.  Conditions/risks identified: Get blood sugar under control   Next appointment: Follow up in one year for your annual wellness visit    Preventive Care 65 Years and Older, Female Preventive care refers to lifestyle choices and visits with your health care provider that can promote health and wellness. What does preventive care include? A yearly physical exam. This is also called an annual well check. Dental exams once or twice a year. Routine eye exams. Ask your health care provider how often you should have your eyes checked. Personal lifestyle choices, including: Daily care of your teeth and gums. Regular physical activity. Eating a healthy diet. Avoiding tobacco and drug use. Limiting alcohol use. Practicing safe sex. Taking low-dose aspirin every day. Taking vitamin and mineral supplements as recommended by your health care provider. What happens during an annual well check? The services and screenings done by your health care provider during your annual well check will depend on your  age, overall health, lifestyle risk factors, and family history of disease. Counseling  Your health care provider may ask you questions about your: Alcohol use. Tobacco use. Drug use. Emotional well-being. Home and relationship well-being. Sexual activity. Eating habits. History of falls. Memory and ability to understand (cognition). Work and work Statistician. Reproductive health. Screening  You may have the following tests or measurements: Height, weight, and BMI. Blood pressure. Lipid and cholesterol levels. These may be checked every 5 years, or more frequently if you are over 86 years old. Skin check. Lung cancer screening. You may have this screening every year starting at age 36 if you have a 30-pack-year history of smoking and currently smoke or have quit within the past 15 years. Fecal occult blood test (FOBT) of the stool. You may have this test every year starting at age 22. Flexible sigmoidoscopy or colonoscopy. You may have a sigmoidoscopy every 5 years or a colonoscopy every 10 years starting at age 57. Hepatitis C blood test. Hepatitis B blood test. Sexually transmitted disease (STD) testing. Diabetes screening. This is done by checking your blood sugar (glucose) after you have not eaten for a while (fasting). You may have this done every 1-3 years. Bone density scan. This is done to screen for osteoporosis. You may have this done starting at age 32. Mammogram. This may be done every 1-2 years. Talk to your health care provider about how often you should have regular mammograms. Talk with your health care provider about your test results, treatment options, and if necessary, the need for more tests. Vaccines  Your health care provider may recommend certain vaccines, such as: Influenza vaccine. This is recommended every year. Tetanus, diphtheria,  and acellular pertussis (Tdap, Td) vaccine. You may need a Td booster every 10 years. Zoster vaccine. You may need this after  age 34. Pneumococcal 13-valent conjugate (PCV13) vaccine. One dose is recommended after age 64. Pneumococcal polysaccharide (PPSV23) vaccine. One dose is recommended after age 18. Talk to your health care provider about which screenings and vaccines you need and how often you need them. This information is not intended to replace advice given to you by your health care provider. Make sure you discuss any questions you have with your health care provider. Document Released: 05/14/2015 Document Revised: 01/05/2016 Document Reviewed: 02/16/2015 Elsevier Interactive Patient Education  2017 [employer] Prevention in the Home Falls can cause injuries. They can happen to people of all ages. There are many things you can do to make your home safe and to help prevent falls. What can I do on the outside of my home? Regularly fix the edges of walkways and driveways and fix any cracks. Remove anything that might make you trip as you walk through a door, such as a raised step or threshold. Trim any bushes or trees on the path to your home. Use bright outdoor lighting. Clear any walking paths of anything that might make someone trip, such as rocks or tools. Regularly check to see if handrails are loose or broken. Make sure that both sides of any steps have handrails. Any raised decks and porches should have guardrails on the edges. Have any leaves, snow, or ice cleared regularly. Use sand or salt on walking paths during winter. Clean up any spills in your garage right away. This includes oil or grease spills. What can I do in the bathroom? Use night lights. Install grab bars by the toilet and in the tub and shower. Do not use towel bars as grab bars. Use non-skid mats or decals in the tub or shower. If you need to sit down in the shower, use a plastic, non-slip stool. Keep the floor dry. Clean up any water that spills on the floor as soon as it happens. Remove soap buildup in the tub or shower  regularly. Attach bath mats securely with double-sided non-slip rug tape. Do not have throw rugs and other things on the floor that can make you trip. What can I do in the bedroom? Use night lights. Make sure that you have a light by your bed that is easy to reach. Do not use any sheets or blankets that are too big for your bed. They should not hang down onto the floor. Have a firm chair that has side arms. You can use this for support while you get dressed. Do not have throw rugs and other things on the floor that can make you trip. What can I do in the kitchen? Clean up any spills right away. Avoid walking on wet floors. Keep items that you use a lot in easy-to-reach places. If you need to reach something above you, use a strong step stool that has a grab bar. Keep electrical cords out of the way. Do not use floor polish or wax that makes floors slippery. If you must use wax, use non-skid floor wax. Do not have throw rugs and other things on the floor that can make you trip. What can I do with my stairs? Do not leave any items on the stairs. Make sure that there are handrails on both sides of the stairs and use them. Fix handrails that are broken or loose. Make sure  that handrails are as long as the stairways. Check any carpeting to make sure that it is firmly attached to the stairs. Fix any carpet that is loose or worn. Avoid having throw rugs at the top or bottom of the stairs. If you do have throw rugs, attach them to the floor with carpet tape. Make sure that you have a light switch at the top of the stairs and the bottom of the stairs. If you do not have them, ask someone to add them for you. What else can I do to help prevent falls? Wear shoes that: Do not have high heels. Have rubber bottoms. Are comfortable and fit you well. Are closed at the toe. Do not wear sandals. If you use a stepladder: Make sure that it is fully opened. Do not climb a closed stepladder. Make sure that  both sides of the stepladder are locked into place. Ask someone to hold it for you, if possible. Clearly mark and make sure that you can see: Any grab bars or handrails. First and last steps. Where the edge of each step is. Use tools that help you move around (mobility aids) if they are needed. These include: Canes. Walkers. Scooters. Crutches. Turn on the lights when you go into a dark area. Replace any light bulbs as soon as they burn out. Set up your furniture so you have a clear path. Avoid moving your furniture around. If any of your floors are uneven, fix them. If there are any pets around you, be aware of where they are. Review your medicines with your doctor. Some medicines can make you feel dizzy. This can increase your chance of falling. Ask your doctor what other things that you can do to help prevent falls. This information is not intended to replace advice given to you by your health care provider. Make sure you discuss any questions you have with your health care provider. Document Released: 02/11/2009 Document Revised: 09/23/2015 Document Reviewed: 05/22/2014 Elsevier Interactive Patient Education  2017 [employer].

## 2021-02-24 NOTE — Progress Notes (Addendum)
Virtual Visit via Telephone Note  I connected with  Jessica [name] on 02/24/21 at  2:30 PM EDT by telephone and verified that I am speaking with the correct person using two identifiers.  Medicare Annual Wellness visit completed telephonically due to Covid-19 pandemic.   Persons participating in this call: This Health Coach and this patient.   Location: Patient: Home  Provider: office   I discussed the limitations, risks, security and privacy concerns of performing an evaluation and management service by telephone and the availability of in person appointments. The patient expressed understanding and agreed to proceed.  Unable to perform video visit due to video visit attempted and failed and/or patient does not have video capability.   Some vital signs may be absent or patient reported.   Jessica Brace, LPN   Subjective:   Female Jessica [name] is a 70 y.o. female who presents for Medicare Annual (Subsequent) preventive examination.  Review of Systems     Cardiac Risk Factors include: advanced age (>53mn, >>51women);hypertension;dyslipidemia;diabetes mellitus;obesity (BMI >30kg/m2)     Objective:    Today's Vitals   02/24/21 1422  PainSc: 5    There is no height or weight on file to calculate BMI.  Advanced Directives 02/24/2021 02/19/2020 01/16/2019 10/31/2018 10/25/2017 01/07/2016 11/10/2015  Does Patient Have a Medical Advance Directive? Yes No No No No No No  Type of Advance Directive H[place]in Chart? No - copy requested - - - - - -  Would patient like information on creating a medical advance directive? - - Yes (MAU/Ambulatory/Procedural Areas - Information given) No - Patient declined - No - patient declined information Yes - Educational materials given    Current Medications (verified) Outpatient Encounter Medications as of 02/24/2021  Medication Sig   albuterol (VENTOLIN HFA) 108 (90 Base) MCG/ACT  inhaler Inhale 2 puffs into the lungs every 6 (six) hours as needed for wheezing or shortness of breath.   atorvastatin (LIPITOR) 40 MG tablet TAKE 1 TABLET BY MOUTH EVERY DAY   BD INSULIN SYRINGE U/F 31G X 5/16" 1 ML MISC USE UP TO 3 TIMES A DAY   dexlansoprazole (DEXILANT) 60 MG capsule Take 1 capsule (60 mg total) by mouth daily. D/c rx for lansoprazole; may try both generic and name brand dexilant rx Pt will pay out of pocket. Insurance will not cover   fluticasone (FLONASE) 50 MCG/ACT nasal spray Place 1 spray into both nostrils daily.   furosemide (LASIX) 20 MG tablet TAKE 1 TABLET (20 MG TOTAL) BY MOUTH DAILY AS NEEDED FOR FLUID OR EDEMA.   gabapentin (NEURONTIN) 600 MG tablet TAKE 1 TABLET IN THE MORNING AND 2 TABLETS AT BEDTIME   ibuprofen (ADVIL) 200 MG tablet Take 200 mg by mouth daily.   insulin NPH-regular Human (HUMULIN 70/30) (70-30) 100 UNIT/ML injection 40 UNITS WITH BREAKFAST, AND 8 UNITS WITH SUPPER.   ketoconazole (NIZORAL) 2 % cream APPLY TWICE A DAY FOR 2 WEEKS AS NEEDED FOR RASH   olmesartan (BENICAR) 40 MG tablet TAKE 1 TABLET BY MOUTH EVERY DAY   ONETOUCH ULTRA test strip USE 2 TIMES DAILY. AND LANCETS 2/DAY   polyethylene glycol powder ([facility] PURELAX) 17 GM/SCOOP powder DISSOLVE 1 CAPFUL IN AT LEAST 8 OUNCES WATER/JUICE AND DRINK TWICE DAILY   Semaglutide,0.25 or 0.5MG/DOS, (OZEMPIC, 0.25 OR 0.5 MG/DOSE,) 2 MG/1.5ML SOPN Inject 0.25 mg into the skin once a week.  sucralfate (CARAFATE) 1 GM/10ML suspension Take 10 mLs (1 g total) by mouth 4 (four) times daily.   valACYclovir (VALTREX) 500 MG tablet TAKE 1 TABLET BY MOUTH EVERY DAY   Vitamin D, Ergocalciferol, (DRISDOL) 1.25 MG (50000 UNIT) CAPS capsule Take 1 capsule (50,000 Units total) by mouth every 7 (seven) days.   No facility-administered encounter medications on file as of 02/24/2021.    Allergies (verified) Gluten meal, Nsaids, Soy allergy, and Wheat bran   History: Past Medical History:  Diagnosis Date    Anxiety    Arthritis    bilateral knees   Asthma    childhood   Autonomic neuropathy    diabetic   Cataract    Celiac disease    Constipation    Depression    Diabetes mellitus    type II, Hemoglobin A1C 9.9 10/05/2011   Diabetic neuropathy ([place])    Diverticulosis    Fatty liver    Gastroparesis    GERD (gastroesophageal reflux disease)    Hyperlipidemia    Hypertension    IBS (irritable bowel syndrome)    Kidney stones    Personal history of colonic polyps 03/2010   hyperplastic   Shingles 2021   Past Surgical History:  Procedure Laterality Date   APPENDECTOMY     CATARACT EXTRACTION Right 04/29/2018   CATARACT EXTRACTION Left 03/2018   COLONOSCOPY     DILATATION & CURRETTAGE/HYSTEROSCOPY WITH RESECTOCOPE N/A 03/24/2013   Procedure: DILATATION & CURETTAGE, HYSTEROSCOPY WITH RESECTION;  Surgeon: Olga Millers, MD;  Location: [facility];  Service: Gynecology;  Laterality: N/A;   HYSTEROSCOPY WITH D & C N/A 11/08/2015   Procedure: DILATATION AND CURETTAGE /HYSTEROSCOPY;  Surgeon: Olga Millers, MD;  Location: [facility];  Service: Gynecology;  Laterality: N/A;   left breast cyst removal  2000   LEFT HEART CATH AND CORONARY ANGIOGRAPHY N/A 10/31/2018   Procedure: LEFT HEART CATH AND CORONARY ANGIOGRAPHY;  Surgeon: Jettie Booze, MD;  Location: [facility];  Service: Cardiovascular;  Laterality: N/A;   TUBAL LIGATION     Family History  Problem Relation Age of Onset   Hypertension Mother    Colon cancer Sister        dx in her early 60s   Diabetes Sister    Endometrial cancer Sister    Hypertension Brother    Other Father        2 collapsed lungs   COPD Father    Diabetes Father    Heart attack Father    Heart failure Father    High blood pressure Father    Colon polyps Neg Hx    Esophageal cancer Neg Hx    Stomach cancer Neg Hx    Social History   Socioeconomic History   Marital status: Married    Spouse name: Jessica [name]   Number of children: 2    Years of education: Not on file   Highest education level: Not on file  Occupational History   Occupation: Scientist, 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   Occupation: retired  Tobacco Use   Smoking status: Never   Smokeless tobacco: Never  Scientific laboratory technician Use: Never used  Substance and Sexual Activity   Alcohol use: No   Drug use: No   Sexual activity: Yes    Birth control/protection: Post-menopausal, Surgical  Other Topics Concern   Not on file  Social History Narrative   Not on  file   Social Determinants of Health   Financial Resource Strain: Low Risk    Difficulty of Paying Living Expenses: Not hard at all  Food Insecurity: No Food Insecurity   Worried About Charity fundraiser in the Last Year: Never true   Arboriculturist in the Last Year: Never true  Transportation Needs: No Transportation Needs   Lack of Transportation (Medical): No   Lack of Transportation (Non-Medical): No  Physical Activity: Sufficiently Active   Days of Exercise per Week: 7 days   Minutes of Exercise per Session: 60 min  Stress: Stress Concern Present   Feeling of Stress : To some extent  Social Connections: Moderately Integrated   Frequency of Communication with Friends and Family: More than three times a week   Frequency of Social Gatherings with Friends and Family: Once a week   Attends Religious Services: More than 4 times per year   Active Member of [employer] or Organizations: No   Attends Music therapist: Never   Marital Status: Married    Tobacco Counseling Counseling given: Not Answered   Clinical Intake:  Pre-visit preparation completed: Yes  Pain : 0-10 Pain Score: 5  Pain Type: Chronic pain Pain Location: Hip (left side hip and rib cage) Pain Orientation: Left Pain Descriptors / Indicators: Aching Pain Onset: 1 to 4 weeks ago Pain Frequency: Intermittent     Nutritional Status: BMI > 30  Obese Nutritional Risks:  None Diabetes: Yes CBG done?: Yes (263) CBG resulted in Enter/ Edit results?: No Did pt. bring in CBG monitor from home?: No  How often do you need to have someone help you when you read instructions, pamphlets, or other written materials from your doctor or pharmacy?: 1 - Never  Diabetic?Nutrition Risk Assessment:  Has the patient had any N/V/D within the last 2 months?  No  Does the patient have any non-healing wounds?  No  Has the patient had any unintentional weight loss or weight gain?  No   Diabetes:  Is the patient diabetic?  Yes  If diabetic, was a CBG obtained today?  Yes  Did the patient bring in their glucometer from home?  No  How often do you monitor your CBG's? Daily.   Financial Strains and Diabetes Management:  Are you having any financial strains with the device, your supplies or your medication? Yes .  Does the patient want to be seen by Chronic Care Management for management of their diabetes?  No  Would the patient like to be referred to a Nutritionist or for Diabetic Management?  No   Diabetic Exams:  Diabetic Eye Exam: Completed 04/05/20 Diabetic Foot Exam: Completed 10/01/20   Interpreter Needed?: No  Information entered by :: Charlott Rakes, LPN   Activities of Daily Living In your present state of health, do you have any difficulty performing the following activities: 02/24/2021 04/05/2020  Hearing? N N  Vision? N Y  Difficulty concentrating or making decisions? N N  Walking or climbing stairs? N N  Dressing or bathing? N N  Doing errands, shopping? N N  Preparing Food and eating ? N -  Using the Toilet? N -  In the past six months, have you accidently leaked urine? N -  Do you have problems with loss of bowel control? N -  Managing your Medications? N -  Managing your Finances? N -  Housekeeping or managing your Housekeeping? N -  Some recent data might be hidden  Patient Care Team: [name], MD as PCP - General (Family  Medicine) Jettie Booze, MD as PCP - Cardiology (Cardiology) Olga Millers, MD as Attending Physician (Obstetrics and Gynecology) Renato Shin, MD as Consulting Physician (Endocrinology) Pyrtle, Lajuan Lines, MD as Consulting Physician (Gastroenterology) Stephannie Li, [place] as Consulting Physician (Ophthalmology) Garrel Ridgel, DPM as Consulting Physician (Podiatry)  Indicate any recent Medical Services you may have received from other than [facility] providers in the past year (date may be approximate).     Assessment:   This is a routine wellness examination for Wilhelmine.  Hearing/Vision screen Hearing Screening - Comments:: Pt denies any hearing issues  Vision Screening - Comments:: Pt follows up with Dr Alesia Banda   Dietary issues and exercise activities discussed: Current Exercise Habits: Home exercise routine, Type of exercise: Other - see comments (QUEB), Time (Minutes): > 60, Frequency (Times/Week): 7, Weekly Exercise (Minutes/Week): 0   Goals Addressed             This Visit's Progress    Patient Stated       Get blood sugar under control        Depression Screen PHQ 2/9 Scores 02/24/2021 04/05/2020 02/19/2020 03/12/2019 01/16/2019 02/14/2018  PHQ - 2 Score 0 4 1 3  0 2  PHQ- 9 Score - 8 - 10 - 5  Exception Documentation - - - Medical reason - -    Fall Risk Fall Risk  02/24/2021 04/05/2020 02/19/2020 08/13/2019 01/16/2019  Falls in the past year? 0 1 1 0 0  Number falls in past yr: 0 0 1 0 0  Injury with Fall? 0 0 1 0 0  Risk for fall due to : Impaired vision - Impaired mobility;Impaired vision - -  Risk for fall due to: Comment - - pt states she get light headed at times - -  Follow up Falls prevention discussed - Falls prevention discussed - Education provided;Falls evaluation completed    FALL RISK PREVENTION PERTAINING TO THE HOME:  Any stairs in or around the home? Yes  If so, are there any without handrails? No  Home free of loose throw rugs in walkways, pet  beds, electrical cords, etc? Yes  Adequate lighting in your home to reduce risk of falls? Yes   ASSISTIVE DEVICES UTILIZED TO PREVENT FALLS:  Life alert? No  Use of a cane, walker or w/c? No  Grab bars in the bathroom? Yes  Shower chair or bench in shower? Yes  Elevated toilet seat or a handicapped toilet? Yes   TIMED UP AND GO:  Was the test performed? No .   Cognitive Function:     6CIT Screen 02/24/2021 02/19/2020 01/16/2019  What Year? 0 points 0 points 0 points  What month? 0 points 0 points 0 points  What time? 0 points - 0 points  Count back from 20 0 points 0 points 0 points  Months in reverse 0 points 0 points 0 points  Repeat phrase 0 points 4 points 0 points  Total Score 0 - 0    Immunizations Immunization History  Administered Date(s) Administered   Fluad Quad(high Dose 65+) 01/16/2019, 01/20/2020   Influenza,inj,Quad PF,6+ Mos 02/14/2018   PFIZER Comirnaty(Gray Top)Covid-19 Tri-Sucrose Vaccine 09/02/2020   PFIZER(Purple Top)SARS-COV-2 Vaccination 07/03/2019, 08/06/2019   Pneumococcal Conjugate-13 11/03/2015   Pneumococcal Polysaccharide-23 08/17/2010, 02/14/2018   Td 12/31/2002   Tdap 10/25/2017   Zoster Recombinat (Shingrix) 11/05/2018, 01/28/2019    TDAP status: Up to date  Flu Vaccine status:  Due, Education has been provided regarding the importance of this vaccine. Advised may receive this vaccine at local pharmacy or Health Dept. Aware to provide a copy of the vaccination record if obtained from local pharmacy or Health Dept. Verbalized acceptance and understanding.  Pneumococcal vaccine status: Up to date  Covid-19 vaccine status: Completed vaccines  Qualifies for Shingles Vaccine? Yes   Zostavax completed Yes   Shingrix Completed?: Yes  Screening Tests Health Maintenance  Topic Date Due   COVID-19 Vaccine (4 - Booster for Pfizer series) 10/28/2020   INFLUENZA VACCINE  11/29/2020   OPHTHALMOLOGY EXAM  04/05/2021   MAMMOGRAM  05/01/2021    HEMOGLOBIN A1C  07/12/2021   FOOT EXAM  10/01/2021   COLONOSCOPY (Pts 45-69yr Insurance coverage will need to be confirmed)  10/13/2024   DEXA SCAN  10/25/2025   TETANUS/TDAP  10/26/2027   Pneumonia Vaccine 70 Years old  Completed   Hepatitis C Screening  Completed   Zoster Vaccines- Shingrix  Completed   HPV VACCINES  Aged Out    Health Maintenance  Health Maintenance Due  Topic Date Due   COVID-19 Vaccine (4 - Booster for [place]) 10/28/2020   INFLUENZA VACCINE  11/29/2020    Colorectal cancer screening: Type of screening: Colonoscopy. Completed 10/14/19. Repeat every 5 years  Mammogram status: Completed 05/01/20. Repeat every year  Bone Density status: Completed 10/25/20. Results reflect: Bone density results: OSTEOPENIA. Repeat every 2 years.  Additional Screening:  Hepatitis C Screening:  Completed 11/03/15  Vision Screening: Recommended annual ophthalmology exams for early detection of glaucoma and other disorders of the eye. Is the patient up to date with their annual eye exam?  Yes  Who is the provider or what is the name of the office in which the patient attends annual eye exams? Dr MAlesia BandaIf pt is not established with a provider, would they like to be referred to a provider to establish care? No .   Dental Screening: Recommended annual dental exams for proper oral hygiene  Community Resource Referral / Chronic Care Management: CRR required this visit?  No   CCM required this visit?  No      Plan:     I have personally reviewed and noted the following in the patient's chart:   Medical and social history Use of alcohol, tobacco or illicit drugs  Current medications and supplements including opioid prescriptions.  Functional ability and status Nutritional status Physical activity Advanced directives List of other physicians Hospitalizations, surgeries, and ER visits in previous 12 months Vitals Screenings to include cognitive, depression, and  falls Referrals and appointments  In addition, I have reviewed and discussed with patient certain preventive protocols, quality metrics, and best practice recommendations. A written personalized care plan for preventive services as well as general preventive health recommendations were provided to patient.     TWillette Brace LPN   [id number]  Nurse Notes: None

## 2021-03-01 ENCOUNTER — Other Ambulatory Visit: Payer: Self-pay | Admitting: Podiatry

## 2021-03-01 ENCOUNTER — Other Ambulatory Visit: Payer: Self-pay | Admitting: Physician Assistant

## 2021-03-04 ENCOUNTER — Telehealth: Payer: Self-pay | Admitting: Family Medicine

## 2021-03-04 NOTE — Chronic Care Management (AMB) (Signed)
  Care Management  Note   03/04/2021 Name: Jessica [name] MRN: [id number] DOB: [birth year]  Jessica [name] Warning is a 70 y.o. year old female who is a primary care patient of [name], MD. The care management team was consulted for assistance with chronic disease management and care coordination needs.   [name] was given information about Care Management services today including:  CCM service includes personalized support from designated clinical staff supervised by the physician, including individualized plan of care and coordination with other care providers 24/7 contact phone numbers for assistance for urgent and routine care needs. Service will only be billed when office clinical staff spend 20 minutes or more in a month to coordinate care. Only one practitioner may furnish and bill the service in a calendar month. The patient may stop CCM services at amy time (effective at the end of the month) by phone call to the office staff. The patient will be responsible for cost sharing (co-pay) or up to 20% of the service fee (after annual deductible is met)  Patient agreed to services and verbal consent obtained.  Follow up plan:   Face to Face appointment with care management team member scheduled for: 04/26/21 _0   Noelle Penner Upstream Scheduler

## 2021-03-09 ENCOUNTER — Telehealth: Payer: Self-pay | Admitting: Family Medicine

## 2021-03-09 NOTE — Progress Notes (Signed)
  Care Management  Note   03/09/2021 Name: Jessica [name] MRN: [id number] DOB: [birth year]  [name] is a 70 y.o. year old female who is a primary care patient of [name], MD. The care management team was consulted for assistance with chronic disease management and care coordination needs.   Ms. Escudero was given information about Care Management services today including:  CCM service includes personalized support from designated clinical staff supervised by the physician, including individualized plan of care and coordination with other care providers 24/7 contact phone numbers for assistance for urgent and routine care needs. Service will only be billed when office clinical staff spend 20 minutes or more in a month to coordinate care. Only one practitioner may furnish and bill the service in a calendar month. The patient may stop CCM services at amy time (effective at the end of the month) by phone call to the office staff. The patient will be responsible for cost sharing (co-pay) or up to 20% of the service fee (after annual deductible is met)  Patient agreed to services and verbal consent obtained.  Follow up plan:   Face to Face appointment with care management team member scheduled for: 04/26/21 @11am   Noelle Penner Upstream Scheduler

## 2021-03-09 NOTE — Progress Notes (Signed)
[facility] [address] [place] Phone: [phone] Subjective:    I'm seeing this patient by the request  of:  [name], MD  CC: Right shoulder pain  TSV:[id number]  Last seen 03/2020 for knee pain  Jessica [name] is a 70 y.o. female coming in with complaint of right shoulder pain patient states that it is seems to be more at the base of her neck.  Has some mild intermittent pain going down the arm.  Patient denies any weakness.  He states that it is fairly severe.  Patient is the primary caregiver for her husband who is ailing at the moment.   Recent bone density exam in June was normal.  History of the cervical x-rays in 2020 showed the patient did have moderate degenerative disc disease from C4-C6 with foraminal narrowing also noted at C5 and C6    Past Medical History:  Diagnosis Date   Anxiety    Arthritis    bilateral knees   Asthma    childhood   Autonomic neuropathy    diabetic   Cataract    Celiac disease    Constipation    Depression    Diabetes mellitus    type II, Hemoglobin A1C 9.9 10/05/2011   Diabetic neuropathy ([place])    Diverticulosis    Fatty liver    Gastroparesis    GERD (gastroesophageal reflux disease)    Hyperlipidemia    Hypertension    IBS (irritable bowel syndrome)    Kidney stones    Personal history of colonic polyps 03/2010   hyperplastic   Shingles 2021   Past Surgical History:  Procedure Laterality Date   APPENDECTOMY     CATARACT EXTRACTION Right 04/29/2018   CATARACT EXTRACTION Left 03/2018   COLONOSCOPY     DILATATION & CURRETTAGE/HYSTEROSCOPY WITH RESECTOCOPE N/A 03/24/2013   Procedure: DILATATION & CURETTAGE, HYSTEROSCOPY WITH RESECTION;  Surgeon: Olga Millers, MD;  Location: [facility];  Service: Gynecology;  Laterality: N/A;   HYSTEROSCOPY WITH D & C N/A 11/08/2015   Procedure: DILATATION AND CURETTAGE /HYSTEROSCOPY;  Surgeon: Olga Millers, MD;  Location: [facility];   Service: Gynecology;  Laterality: N/A;   left breast cyst removal  2000   LEFT HEART CATH AND CORONARY ANGIOGRAPHY N/A 10/31/2018   Procedure: LEFT HEART CATH AND CORONARY ANGIOGRAPHY;  Surgeon: Jettie Booze, MD;  Location: [facility];  Service: Cardiovascular;  Laterality: N/A;   TUBAL LIGATION     Social History   Socioeconomic History   Marital status: Married    Spouse name: Devar Huhn   Number of children: 2   Years of education: Not on file   Highest education level: Not on file  Occupational History   Occupation: Scientist, 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   Occupation: retired  Tobacco Use   Smoking status: Never   Smokeless tobacco: Never  Scientific laboratory technician Use: Never used  Substance and Sexual Activity   Alcohol use: No   Drug use: No   Sexual activity: Yes    Birth control/protection: Post-menopausal, Surgical  Other Topics Concern   Not on file  Social History Narrative   Not on file   Social Determinants of Health   Financial Resource Strain: Low Risk    Difficulty of Paying Living Expenses: Not hard at all  Food Insecurity: No Food Insecurity   Worried About Running  Out of Food in the Last Year: Never true   Ran Out of Food in the Last Year: Never true  Transportation Needs: No Transportation Needs   Lack of Transportation (Medical): No   Lack of Transportation (Non-Medical): No  Physical Activity: Sufficiently Active   Days of Exercise per Week: 7 days   Minutes of Exercise per Session: 60 min  Stress: Stress Concern Present   Feeling of Stress : To some extent  Social Connections: Moderately Integrated   Frequency of Communication with Friends and Family: More than three times a week   Frequency of Social Gatherings with Friends and Family: Once a week   Attends Religious Services: More than 4 times per year   Active Member of [employer] or Organizations: No   Attends Music therapist:  Never   Marital Status: Married   Allergies  Allergen Reactions   Gluten Meal Other (See Comments)    No wheat or soy   Nsaids Nausea And Vomiting and Other (See Comments)    Cannot tolerate large doses    Soy Allergy    Wheat Bran    Family History  Problem Relation Age of Onset   Hypertension Mother    Colon cancer Sister        dx in her early 67s   Diabetes Sister    Endometrial cancer Sister    Hypertension Brother    Other Father        2 collapsed lungs   COPD Father    Diabetes Father    Heart attack Father    Heart failure Father    High blood pressure Father    Colon polyps Neg Hx    Esophageal cancer Neg Hx    Stomach cancer Neg Hx     Current Outpatient Medications (Endocrine & Metabolic):    insulin NPH-regular Human (HUMULIN 70/30) (70-30) 100 UNIT/ML injection, 40 UNITS WITH BREAKFAST, AND 8 UNITS WITH SUPPER.   Semaglutide,0.25 or 0.5MG/DOS, (OZEMPIC, 0.25 OR 0.5 MG/DOSE,) 2 MG/1.5ML SOPN, Inject 0.25 mg into the skin once a week.  Current Outpatient Medications (Cardiovascular):    atorvastatin (LIPITOR) 40 MG tablet, TAKE 1 TABLET BY MOUTH EVERY DAY   furosemide (LASIX) 20 MG tablet, TAKE 1 TABLET (20 MG TOTAL) BY MOUTH DAILY AS NEEDED FOR FLUID OR EDEMA.   olmesartan (BENICAR) 40 MG tablet, TAKE 1 TABLET BY MOUTH EVERY DAY  Current Outpatient Medications (Respiratory):    albuterol (VENTOLIN HFA) 108 (90 Base) MCG/ACT inhaler, Inhale 2 puffs into the lungs every 6 (six) hours as needed for wheezing or shortness of breath.   fluticasone (FLONASE) 50 MCG/ACT nasal spray, Place 1 spray into both nostrils daily.  Current Outpatient Medications (Analgesics):    ibuprofen (ADVIL) 200 MG tablet, Take 200 mg by mouth daily.   Current Outpatient Medications (Other):    tizanidine (ZANAFLEX) 2 MG capsule, Take 1 capsule (2 mg total) by mouth at bedtime as needed for muscle spasms.   BD INSULIN SYRINGE U/F 31G X 5/16" 1 ML MISC, USE UP TO 3 TIMES A DAY    dexlansoprazole (DEXILANT) 60 MG capsule, Take 1 capsule (60 mg total) by mouth daily. D/c rx for lansoprazole; may try both generic and name brand dexilant rx Pt will pay out of pocket. Insurance will not cover   gabapentin (NEURONTIN) 600 MG tablet, TAKE 1 TABLET IN THE MORNING AND 2 TABLETS AT BEDTIME   ketoconazole (NIZORAL) 2 % cream, APPLY TWICE A DAY FOR 2  WEEKS AS NEEDED FOR RASH   ondansetron (ZOFRAN ODT) 4 MG disintegrating tablet, Take 1 tablet (4 mg total) by mouth every 8 (eight) hours as needed for nausea or vomiting.   ONETOUCH ULTRA test strip, USE 2 TIMES DAILY. AND LANCETS 2/DAY   polyethylene glycol powder ([facility] PURELAX) 17 GM/SCOOP powder, DISSOLVE 1 CAPFUL IN AT LEAST 8 OUNCES WATER/JUICE AND DRINK TWICE DAILY   sucralfate (CARAFATE) 1 GM/10ML suspension, Take 10 mLs (1 g total) by mouth 4 (four) times daily.   valACYclovir (VALTREX) 500 MG tablet, TAKE 1 TABLET BY MOUTH EVERY DAY   Vitamin D, Ergocalciferol, (DRISDOL) 1.25 MG (50000 UNIT) CAPS capsule, Take 1 capsule (50,000 Units total) by mouth every 7 (seven) days.   Reviewed prior external information including notes and imaging from  primary care provider As well as notes that were available from care everywhere and other healthcare systems.  Past medical history, social, surgical and family history all reviewed in electronic medical record.  No pertanent information unless stated regarding to the chief complaint.   Review of Systems:  No headache, visual changes, nausea, vomiting, diarrhea, constipation, dizziness, abdominal pain, skin rash, fevers, chills, night sweats, weight loss, swollen lymph nodes, body aches, joint swelling, chest pain, shortness of breath, mood changes. POSITIVE muscle aches  Objective  There were no vitals taken for this visit.   General: No apparent distress alert and oriented x3 mood and affect normal, dressed appropriately.  HEENT: Pupils equal, extraocular movements intact   Respiratory: Patient's speak in full sentences and does not appear short of breath  Cardiovascular: No lower extremity edema, non tender, no erythema  Gait  MSK: Shoulder exam shows patient does have good range of motion noted.  Patient now has significant tenderness noted in the parascapular region of the right scapula.  Patient does have mild positive Spurling's noted with radicular symptoms in the C6 area on the right.  Limited muscular skeletal ultrasound was performed and interpreted by Hulan Saas, M  Limited musculoskeletal ultrasound of the right shoulder shows some very mild hypoechoic changes of the rotator cuff but no true acute tear.  No significant arthritic changes of the acromioclavicular joint. Impression: Relatively normal shoulder with mild tendinitis  After verbal consent patient was prepped with alcohol swab and with a 25-gauge half inch needle patient was injected in the area of 4 distinct trigger points in 3 different muscles including the trapezius, rhomboid and latissimus dorsi.  A total of 3 cc of 0.5% Marcaine and 1 cc of Kenalog 40 mg/mL used.  No blood loss.  Band-Aid placed.  Postinjection instructions given.   Impression and Recommendations:     The above documentation has been reviewed and is accurate and complete Lyndal Pulley, DO

## 2021-03-10 ENCOUNTER — Ambulatory Visit (INDEPENDENT_AMBULATORY_CARE_PROVIDER_SITE_OTHER): Payer: Medicare Other | Admitting: Bariatrics

## 2021-03-10 ENCOUNTER — Other Ambulatory Visit: Payer: Self-pay

## 2021-03-10 ENCOUNTER — Ambulatory Visit (INDEPENDENT_AMBULATORY_CARE_PROVIDER_SITE_OTHER): Payer: Medicare Other | Admitting: Family Medicine

## 2021-03-10 ENCOUNTER — Ambulatory Visit: Payer: Self-pay

## 2021-03-10 ENCOUNTER — Encounter: Payer: Self-pay | Admitting: Family Medicine

## 2021-03-10 VITALS — BP 148/86 | HR 75 | Temp 97.7°F | Ht 65.0 in | Wt 214.0 lb

## 2021-03-10 DIAGNOSIS — E559 Vitamin D deficiency, unspecified: Secondary | ICD-10-CM | POA: Diagnosis not present

## 2021-03-10 DIAGNOSIS — E1169 Type 2 diabetes mellitus with other specified complication: Secondary | ICD-10-CM

## 2021-03-10 DIAGNOSIS — E669 Obesity, unspecified: Secondary | ICD-10-CM

## 2021-03-10 DIAGNOSIS — M25511 Pain in right shoulder: Secondary | ICD-10-CM

## 2021-03-10 DIAGNOSIS — Z6841 Body Mass Index (BMI) 40.0 and over, adult: Secondary | ICD-10-CM

## 2021-03-10 DIAGNOSIS — I10 Essential (primary) hypertension: Secondary | ICD-10-CM | POA: Diagnosis not present

## 2021-03-10 MED ORDER — OZEMPIC (0.25 OR 0.5 MG/DOSE) 2 MG/1.5ML ~~LOC~~ SOPN: 0.2500 mg | PEN_INJECTOR | SUBCUTANEOUS | 0 refills | Status: DC

## 2021-03-10 MED ORDER — VITAMIN D (ERGOCALCIFEROL) 1.25 MG (50000 UNIT) PO CAPS: 50000.0000 [IU] | ORAL_CAPSULE | ORAL | 0 refills | Status: DC

## 2021-03-10 MED ORDER — ONDANSETRON 4 MG PO TBDP: 4.0000 mg | ORAL_TABLET | Freq: Three times a day (TID) | ORAL | 0 refills | Status: DC | PRN

## 2021-03-10 MED ORDER — TIZANIDINE HCL 2 MG PO CAPS: 2.0000 mg | ORAL_CAPSULE | Freq: Every evening | ORAL | 1 refills | Status: DC | PRN

## 2021-03-10 NOTE — Assessment & Plan Note (Signed)
Repeat injections given today, tolerated the procedure well, discussed icing regimen and home exercises, which activities to do which wants to avoid.  He is activity slowly.  Do believe that the differential includes cervical radiculopathy and encouraged to continue the gabapentin.  He may need to consider adding Cymbalta if continuing to have pain.  Follow-up with me again 6 weeks worsening pain to the call us sooner.  Worsening pain over the weekend seek medical attention.

## 2021-03-10 NOTE — Patient Instructions (Signed)
Good to see you  Zanaflex at night as needed to help with muscle Exercises 3 times a week.  Ice 20 minutes 2 times daily. Usually after activity and before bed. Keep hands within peripheral vision  See me again in 6 weeks but if not a lot better send [place] a message and we will consider MRI of the neck

## 2021-03-10 NOTE — Progress Notes (Signed)
Chief Complaint:   OBESITY Jessica [name] is here to discuss her progress with her obesity treatment plan along with follow-up of her obesity related diagnoses. Jessica [name] is on the Category 1 Plan and the Category 3 Plan and states she is following her eating plan approximately 95% of the time. Jessica [name] states she is doing Cubii 60 minutes 7 times per week.  Today's visit was #: 25 Starting weight: 246 lbs Starting date: 03/12/2019 Today's weight: 214 lbs Today's date: 03/10/2021 Total lbs lost to date: 32 lbs Total lbs lost since last in-office visit: 0  Interim History: Jessica [name]'s weight remains the same.  Subjective:   1. Vitamin D deficiency Jessica [name] is taking Vitamin D as directed.   2. Type 2 diabetes mellitus with obesity (Jessica [name]) Jessica [name] notes decreased appetite. She is currently taking Ozempic 0.25 mg. Her fasting blood sugar was in the 160's range.   3. Essential hypertension Jessica [name]'s blood pressure was slightly elevated 148/86 but she is in pain today.   Assessment/Plan:   1. Vitamin D deficiency Low Vitamin D level contributes to fatigue and are associated with obesity, breast, and colon cancer. We will refill prescription Vitamin D 50,000 IU every week for 1 month with no refills and Jessica [name] will follow-up for routine testing of Vitamin D, at least 2-3 times per year to avoid over-replacement.  - Vitamin D, Ergocalciferol, (DRISDOL) 1.25 MG (50000 UNIT) CAPS capsule; Take 1 capsule (50,000 Units total) by mouth every 7 (seven) days.  Dispense: 4 capsule; Refill: 0  2. Type 2 diabetes mellitus with obesity ([facility]) We will refill Ozempic 0.25 with no refills. She will increase her water intake. We will refill Zofran 4 mg every 8 hours with no refills. Good blood sugar control is important to decrease the likelihood of diabetic complications such as nephropathy, neuropathy, limb loss, blindness, coronary artery disease, and death. Intensive lifestyle modification including diet, exercise  and weight loss are the first line of treatment for diabetes.   - Semaglutide,0.25 or 0.5MG/DOS, (OZEMPIC, 0.25 OR 0.5 MG/DOSE,) 2 MG/1.5ML SOPN; Inject 0.25 mg into the skin once a week.  Dispense: 1.5 mL; Refill: 0 - ondansetron (ZOFRAN ODT) 4 MG disintegrating tablet; Take 1 tablet (4 mg total) by mouth every 8 (eight) hours as needed for nausea or vomiting.  Dispense: 20 tablet; Refill: 0  3. Essential hypertension Jessica [name] will continue her medications. She is working on healthy weight loss and exercise to improve blood pressure control. We will watch for signs of hypotension as she continues her lifestyle modifications.  4. Obesity with current BMI of 35.7 Jessica [name] is currently in the action stage of change. As such, her goal is to continue with weight loss efforts. She has agreed to the Category 3 Plan.   Jessica [name] will continue to adhere to the plan 95% of the time. She will be mindful eating. Strategies for the holidays were provided.   Exercise goals:  As is.  Behavioral modification strategies: increasing lean protein intake, decreasing simple carbohydrates, increasing vegetables, increasing water intake, decreasing eating out, no skipping meals, meal planning and cooking strategies, keeping healthy foods in the home, and planning for success.  Jessica [name] has agreed to follow-up with our clinic in 6 weeks. She was informed of the importance of frequent follow-up visits to maximize her success with intensive lifestyle modifications for her multiple health conditions.   Objective:   Blood pressure (!) 148/86, pulse 75, temperature 97.7 F (36.5 C), height 5' 5"  (1.651 m), weight 214 lb (  97.1 kg), SpO2 98 %. Body mass index is 35.61 kg/m.  General: Cooperative, alert, well developed, in no acute distress. HEENT: Conjunctivae and lids unremarkable. Cardiovascular: Regular rhythm.  Lungs: Normal work of breathing. Neurologic: No focal deficits.   Lab Results  Component Value Date    CREATININE 1.04 10/07/2020   BUN 23 10/07/2020   NA 135 10/07/2020   K 4.5 10/07/2020   CL 101 10/07/2020   CO2 27 10/07/2020   Lab Results  Component Value Date   ALT 14 10/07/2020   AST 15 10/07/2020   ALKPHOS 72 10/07/2020   BILITOT 0.5 10/07/2020   Lab Results  Component Value Date   HGBA1C 8.2 (A) 01/12/2021   HGBA1C 8.1 (A) 08/12/2020   HGBA1C 7.8 (A) 05/14/2020   HGBA1C 7.1 (A) 12/31/2019   HGBA1C 6.9 (A) 10/28/2019   No results found for: INSULIN Lab Results  Component Value Date   TSH 1.320 09/07/2020   Lab Results  Component Value Date   CHOL 131 10/07/2020   HDL 52.50 10/07/2020   LDLCALC 58 10/07/2020   LDLDIRECT 143.2 11/23/2006   TRIG 101.0 10/07/2020   CHOLHDL 2 10/07/2020   Lab Results  Component Value Date   VD25OH 85.0 09/07/2020   VD25OH 61.6 03/11/2020   VD25OH 57.2 07/24/2019   Lab Results  Component Value Date   WBC 4.8 10/28/2018   HGB 13.4 10/28/2018   HCT 42.6 09/07/2020   MCV 97 10/28/2018   PLT 260 10/28/2018   Lab Results  Component Value Date   IRON 113 09/07/2020   TIBC 276 09/07/2020   FERRITIN 149 09/07/2020    Attestation Statements:   Reviewed by clinician on day of visit: allergies, medications, problem list, medical history, surgical history, family history, social history, and previous encounter notes.  I, Lizbeth Bark, RMA, am acting as Location manager for [employer], DO.   I have reviewed the above documentation for accuracy and completeness, and I agree with the above. [name], DO

## 2021-03-11 ENCOUNTER — Encounter (INDEPENDENT_AMBULATORY_CARE_PROVIDER_SITE_OTHER): Payer: Self-pay | Admitting: Bariatrics

## 2021-03-15 ENCOUNTER — Ambulatory Visit: Payer: Medicare Other | Admitting: Family Medicine

## 2021-03-29 ENCOUNTER — Other Ambulatory Visit: Payer: Self-pay | Admitting: Family Medicine

## 2021-04-03 ENCOUNTER — Other Ambulatory Visit: Payer: Self-pay | Admitting: Family Medicine

## 2021-04-04 ENCOUNTER — Other Ambulatory Visit (INDEPENDENT_AMBULATORY_CARE_PROVIDER_SITE_OTHER): Payer: Self-pay | Admitting: Bariatrics

## 2021-04-04 DIAGNOSIS — E559 Vitamin D deficiency, unspecified: Secondary | ICD-10-CM

## 2021-04-04 NOTE — Telephone Encounter (Signed)
Pt last seen by Dr. Brown.  

## 2021-04-04 NOTE — Telephone Encounter (Signed)
LAST APPOINTMENT DATE: 03/10/21 NEXT APPOINTMENT DATE: 04/21/21   [id number] [place], [place] - [address] [place] [id number]: [phone]: 3[place] [address] [id number]: [phone]: [phone] Patient is requesting a refill of the following medications: Requested Prescriptions   Pending Prescriptions Disp Refills   Vitamin D, Ergocalciferol, (DRISDOL) 1.25 MG (50000 UNIT) CAPS capsule [Pharmacy Med Name: VITAMIN D2 1.25MG(50,000 UNIT)] 4 capsule 0    Sig: TAKE 1 CAPSULE (50,000 UNITS TOTAL) BY MOUTH EVERY 7 (SEVEN) DAYS    Date last filled: 03/10/21 Previously prescribed by Dr. BOwens Shark Lab Results  Component Value Date   HGBA1C 8.2 (A) 01/12/2021   HGBA1C 8.1 (A) 08/12/2020   HGBA1C 7.8 (A) 05/14/2020   Lab Results  Component Value Date   MICROALBUR 0.7 10/25/2017   LDLCALC 58 10/07/2020   CREATININE 1.04 10/07/2020   Lab Results  Component Value Date   VD25OH 85.0 09/07/2020   VD25OH 61.6 03/11/2020   VD25OH 57.2 07/24/2019    BP Readings from Last 3 Encounters:  03/10/21 (!) 148/86  02/09/21 (!) 154/75  01/12/21 (!) 145/82

## 2021-04-13 ENCOUNTER — Ambulatory Visit (INDEPENDENT_AMBULATORY_CARE_PROVIDER_SITE_OTHER): Payer: Medicare Other | Admitting: Family Medicine

## 2021-04-13 ENCOUNTER — Encounter: Payer: Self-pay | Admitting: Family Medicine

## 2021-04-13 ENCOUNTER — Other Ambulatory Visit: Payer: Self-pay

## 2021-04-13 VITALS — BP 120/62 | HR 78 | Temp 97.0°F | Ht 65.0 in | Wt 214.8 lb

## 2021-04-13 DIAGNOSIS — R5383 Other fatigue: Secondary | ICD-10-CM

## 2021-04-13 DIAGNOSIS — Z794 Long term (current) use of insulin: Secondary | ICD-10-CM

## 2021-04-13 DIAGNOSIS — E1169 Type 2 diabetes mellitus with other specified complication: Secondary | ICD-10-CM | POA: Diagnosis not present

## 2021-04-13 DIAGNOSIS — F4321 Adjustment disorder with depressed mood: Secondary | ICD-10-CM | POA: Diagnosis not present

## 2021-04-13 DIAGNOSIS — E1142 Type 2 diabetes mellitus with diabetic polyneuropathy: Secondary | ICD-10-CM | POA: Diagnosis not present

## 2021-04-13 DIAGNOSIS — I10 Essential (primary) hypertension: Secondary | ICD-10-CM

## 2021-04-13 DIAGNOSIS — E782 Mixed hyperlipidemia: Secondary | ICD-10-CM

## 2021-04-13 DIAGNOSIS — E118 Type 2 diabetes mellitus with unspecified complications: Secondary | ICD-10-CM | POA: Diagnosis not present

## 2021-04-13 LAB — CBC WITH DIFFERENTIAL/PLATELET
Basophils Absolute: 0 10*3/uL (ref 0.0–0.1)
Basophils Relative: 0.7 % (ref 0.0–3.0)
Eosinophils Absolute: 0.1 10*3/uL (ref 0.0–0.7)
Eosinophils Relative: 2.1 % (ref 0.0–5.0)
HCT: 40.2 % (ref 36.0–46.0)
Hemoglobin: 13.4 g/dL (ref 12.0–15.0)
Lymphocytes Relative: 30.9 % (ref 12.0–46.0)
Lymphs Abs: 1 10*3/uL (ref 0.7–4.0)
MCHC: 33.3 g/dL (ref 30.0–36.0)
MCV: 99.6 fl (ref 78.0–100.0)
Monocytes Absolute: 0.2 10*3/uL (ref 0.1–1.0)
Monocytes Relative: 4.8 % (ref 3.0–12.0)
Neutro Abs: 2 10*3/uL (ref 1.4–7.7)
Neutrophils Relative %: 61.5 % (ref 43.0–77.0)
Platelets: 193 10*3/uL (ref 150.0–400.0)
RBC: 4.04 Mil/uL (ref 3.87–5.11)
RDW: 13.1 % (ref 11.5–15.5)
WBC: 3.3 10*3/uL — ABNORMAL LOW (ref 4.0–10.5)

## 2021-04-13 LAB — VITAMIN D 25 HYDROXY (VIT D DEFICIENCY, FRACTURES): VITD: 44.7 ng/mL (ref 30.00–100.00)

## 2021-04-13 LAB — B12 AND FOLATE PANEL
Folate: 15 ng/mL (ref >5.9)
Vitamin B-12: 580 pg/mL (ref 211–911)

## 2021-04-13 LAB — COMPREHENSIVE METABOLIC PANEL
ALT: 16 U/L (ref 0–35)
AST: 14 U/L (ref 0–37)
Albumin: 3.8 g/dL (ref 3.5–5.2)
Alkaline Phosphatase: 71 U/L (ref 39–117)
BUN: 22 mg/dL (ref 6–23)
CO2: 29 mEq/L (ref 19–32)
Calcium: 9.4 mg/dL (ref 8.4–10.5)
Chloride: 103 mEq/L (ref 96–112)
Creatinine, Ser: 0.98 mg/dL (ref 0.40–1.20)
GFR: 58.47 mL/min — ABNORMAL LOW (ref >60.00)
Glucose, Bld: 265 mg/dL — ABNORMAL HIGH (ref 70–99)
Potassium: 4.9 mEq/L (ref 3.5–5.1)
Sodium: 138 mEq/L (ref 135–145)
Total Bilirubin: 0.5 mg/dL (ref 0.2–1.2)
Total Protein: 6.4 g/dL (ref 6.0–8.3)

## 2021-04-13 LAB — TSH: TSH: 0.91 u[IU]/mL (ref 0.35–5.50)

## 2021-04-13 LAB — LIPID PANEL
Cholesterol: 135 mg/dL (ref 0–200)
HDL: 59.7 mg/dL (ref >39.00)
LDL Cholesterol: 65 mg/dL (ref 0–99)
NonHDL: 75.27
Total CHOL/HDL Ratio: 2
Triglycerides: 50 mg/dL (ref 0.0–149.0)
VLDL: 10 mg/dL (ref 0.0–40.0)

## 2021-04-13 LAB — POCT GLYCOSYLATED HEMOGLOBIN (HGB A1C): Hemoglobin A1C: 7.7 % — AB (ref 4.0–5.6)

## 2021-04-13 LAB — SEDIMENTATION RATE: Sed Rate: 10 mm/hr (ref 0–30)

## 2021-04-13 MED ORDER — ESCITALOPRAM OXALATE 10 MG PO TABS: 10.0000 mg | ORAL_TABLET | Freq: Every day | ORAL | 2 refills | Status: DC

## 2021-04-13 NOTE — Patient Instructions (Signed)
Please return in 6 months for recheck OR 6 weeks to recheck mood and fatigue if you start the medication.   I will release your lab results to you on your MyChart account with further instructions. Please reply with any questions.    Take good care of yourself! You deserve it!  If you have any questions or concerns, please don't hesitate to send me a message via MyChart or call the office at [phone]. Thank you for visiting with [place] today! It's our pleasure caring for you.

## 2021-04-13 NOTE — Progress Notes (Signed)
Subjective  CC:  Chief Complaint  Patient presents with   Annual Exam    Not fasting   Hyperlipidemia   Hypertension   Diabetes    HPI: Jessica [name] is a 70 y.o. female who presents to the office today for follow up of diabetes and problems listed above in the chief complaint.  Diabetes follow AJ:[id number] by endocrine. Control is good.  Reviewed notes.  Complains of worsening fatigue.  Has longstanding insomnia.  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  She reports that 7 days she just cannot get up because she is so tired.  She denies specific symptoms including fevers, chills, chest pain, shortness of breath, lower extremity edema, abdominal pain, melena, hematuria.  Further review reveals increased stressors.  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  Admits to increased irritability and some low mood symptoms.  Has never been diagnosed with a mood disorder but does admit to some depressive symptoms.  Has never taken a sleep medication. Depression screen [facility] 2/9 04/13/2021 02/24/2021 04/05/2020 02/19/2020 03/12/2019  Decreased Interest 2 0 2 0 1  Down, Depressed, Hopeless 1 0 2 1 2   PHQ - 2 Score 3 0 4 1 3   Altered sleeping 3 - 2 - 2  Tired, decreased energy 2 - 2 - 3  Change in appetite 1 - 0 - 2  Feeling bad or failure about yourself  0 - 0 - 0  Trouble concentrating 0 - 0 - 0  Moving slowly or fidgety/restless 1 - 0 - 0  Suicidal thoughts 0 - 0 - 0  PHQ-9 Score 10 - 8 - 10  Difficult doing work/chores Somewhat difficult - - - Somewhat difficult  Some recent data might be hidden   Hypertension and hyperlipidemia have been well controlled.  Denies chest pain.  Endorses occasional palpitations.  No change in peripheral edema.  Has multiple specialist following for GI, cardiology, endocrinology and GYN.  Reviewed notes.  Wt Readings from Last 3 Encounters:  04/13/21 214 lb 12.8 oz (97.4 kg)   03/10/21 214 lb (97.1 kg)  02/09/21 214 lb (97.1 kg)    BP Readings from Last 3 Encounters:  04/13/21 120/62  03/10/21 (!) 148/86  02/09/21 (!) 154/75    Assessment  1. Acute adjustment disorder with depressed mood   2. Other fatigue   3. Type 2 diabetes mellitus with complication, with long-term current use of insulin ([place])   4. Essential hypertension   5. Peripheral sensory neuropathy due to type 2 diabetes mellitus ([place])   6. Combined hyperlipidemia associated with type 2 diabetes mellitus ([place])      Plan  Diabetes is currently very well controlled.  No changes in medications today. Fatigue: Likely multifactorial.  Check lab work to rule out common etiologies.  However I counseled regarding possible relationship with chronic insomnia, adjustment stress reaction and depression.  If lab work is unrevealing, recommend starting Lexapro daily.  Patient agrees.  Close follow-up. Hypertension remains well controlled on ARB. Hyperlipidemia with LDL at goal on statin. I spent a total of 50 minutes for this patient encounter. Time spent included preparation, face-to-face counseling with the patient and coordination of care, review of chart and records, and documentation of the encounter.  Follow up: Return in about 6 months (around 10/12/2021) for recheck.. Orders Placed This Encounter  Procedures   CBC with Differential/Platelet   Comprehensive metabolic panel   Lipid panel  Sedimentation rate   TSH   VITAMIN D 25 Hydroxy (Vit-D Deficiency, Fractures)   B12 and Folate Panel   POCT HgB A1C   Meds ordered this encounter  Medications   escitalopram (LEXAPRO) 10 MG tablet    Sig: Take 1 tablet (10 mg total) by mouth daily.    Dispense:  30 tablet    Refill:  2      Immunization History  Administered Date(s) Administered   Fluad Quad(high Dose 65+) 01/16/2019, 01/20/2020   Influenza,inj,Quad PF,6+ Mos 02/14/2018   PFIZER Comirnaty(Gray Top)Covid-19 Tri-Sucrose Vaccine  09/02/2020   PFIZER(Purple Top)SARS-COV-2 Vaccination 07/03/2019, 08/06/2019   Pneumococcal Conjugate-13 11/03/2015   Pneumococcal Polysaccharide-23 08/17/2010, 02/14/2018   Td 12/31/2002   Tdap 10/25/2017   Zoster Recombinat (Shingrix) 11/05/2018, 01/28/2019    Diabetes Related Lab Review: Lab Results  Component Value Date   HGBA1C 7.7 (A) 04/13/2021   HGBA1C 8.2 (A) 01/12/2021   HGBA1C 8.1 (A) 08/12/2020    Lab Results  Component Value Date   MICROALBUR 0.7 10/25/2017   Lab Results  Component Value Date   CREATININE 1.04 10/07/2020   BUN 23 10/07/2020   NA 135 10/07/2020   K 4.5 10/07/2020   CL 101 10/07/2020   CO2 27 10/07/2020   Lab Results  Component Value Date   CHOL 131 10/07/2020   CHOL 145 03/11/2020   CHOL 134 10/16/2018   Lab Results  Component Value Date   HDL 52.50 10/07/2020   HDL 58 03/11/2020   HDL 47.40 10/16/2018   Lab Results  Component Value Date   LDLCALC 58 10/07/2020   LDLCALC 74 03/11/2020   LDLCALC 74 10/16/2018   Lab Results  Component Value Date   TRIG 101.0 10/07/2020   TRIG 62 03/11/2020   TRIG 64.0 10/16/2018   Lab Results  Component Value Date   CHOLHDL 2 10/07/2020   CHOLHDL 3 10/16/2018   CHOLHDL 3 10/25/2017   Lab Results  Component Value Date   LDLDIRECT 143.2 11/23/2006   The 10-year ASCVD risk score (Arnett DK, et al., 2019) is: 16.5%   Values used to calculate the score:     Age: 35 years     Sex: Female     Is Non-Hispanic African American: Yes     Diabetic: Yes     Tobacco smoker: No     Systolic Blood Pressure: 786 mmHg     Is BP treated: Yes     HDL Cholesterol: 52.5 mg/dL     Total Cholesterol: 131 mg/dL I have reviewed the PMH, Fam and Soc history. 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[place]) 04/05/2020    Priority: High   Celiac disease 02/14/2018    Priority: High   Diabetic neuropathy associated with type 2 diabetes  mellitus ([place]) 02/14/2018    Priority: High   Class 2 obesity due to excess calories with body mass index (BMI) of 37.0 to 37.9 in adult 02/14/2018    Priority: High   Type 2 diabetes mellitus with complication, with long-term current use of insulin ([place]) 09/23/2010    Priority: High   Combined hyperlipidemia associated with type 2 diabetes mellitus ([place]) 01/29/2009    Priority: High   Essential hypertension 10/22/2006    Priority: High    Qualifier: Diagnosis of  By: Marca Ancona RMA, Lucy      Osteopenia 02/14/2018    Priority: Medium     dexa  2019    Mild intermittent asthma 02/14/2018    Priority: Medium    Degenerative arthritis of knee, bilateral 10/29/2015    Priority: Medium     Bilateral steroid injections 12/02/2015 Started Orthovisc 12/16/2015 Monovisc given 04/07/2020    Degenerative cervical disc 07/27/2015    Priority: Medium    OSA (obstructive sleep apnea) 02/28/2011    Priority: Medium    Constipation, slow transit 09/23/2010    Priority: Medium    FH: colon cancer 09/23/2010    Priority: Medium    Personal history of colonic polyps 09/23/2010    Priority: Medium    Diabetic gastroparesis ([place]) 02/27/2007    Priority: Medium    Diabetic autonomic neuropathy associated with type 2 diabetes mellitus ([place]) 10/22/2006    Priority: Medium    Bilateral lower extremity edema 01/20/2020    Priority: Low   Fibroma of tongue 06/06/2016    Priority: Low   Renal calculi 01/13/2016    Priority: Low   Thyroid nodule 10/28/2019   Abnormal stress test    Family history of malignant neoplasm of endometrium 10/08/2018   Trigger point of right shoulder region 08/02/2015    Injected 08/02/2015 injections given March 10, 2021     Social History: Patient  reports that she has never smoked. She has never used smokeless tobacco. She reports that she does not drink alcohol and does not use drugs.  Review of Systems: Ophthalmic: negative for eye pain, loss of vision or  double vision Cardiovascular: negative for chest pain Respiratory: negative for SOB or persistent cough Gastrointestinal: negative for abdominal pain Genitourinary: negative for dysuria or gross hematuria MSK: negative for foot lesions Neurologic: negative for weakness or gait disturbance  Objective  Vitals: BP 120/62    Pulse 78    Temp (!) 97 F (36.1 C) (Temporal)    Ht 5' 5"  (1.651 m)    Wt 214 lb 12.8 oz (97.4 kg)    SpO2 97%    BMI 35.74 kg/m  General: well appearing, no acute distress  Psych:  Alert and oriented, normal mood and affect HEENT:  Normocephalic, atraumatic, moist mucous membranes, supple neck  Cardiovascular:  Nl S1 and S2, RRR without murmur, gallop or rub. no edema Respiratory:  Good breath sounds bilaterally, CTAB with normal effort, no rales Gastrointestinal: normal BS, soft, nontender Skin:  Warm, no rashes     Diabetic education: ongoing education regarding chronic disease management for diabetes was given today. We continue to reinforce the ABC's of diabetic management: A1c (<7 or 8 dependent upon patient), tight blood pressure control, and cholesterol management with goal LDL < 100 minimally. We discuss diet strategies, exercise recommendations, medication options and possible side effects. At each visit, we review recommended immunizations and preventive care recommendations for diabetics and stress that good diabetic control can prevent other problems. See below for this patient's data.   Commons side effects, risks, benefits, and alternatives for medications and treatment plan prescribed today were discussed, and the patient expressed understanding of the given instructions. Patient is instructed to call or message via MyChart if he/she has any questions or concerns regarding our treatment plan. No barriers to understanding were identified. We discussed Red Flag symptoms and signs in detail. Patient expressed understanding regarding what to do in case of  urgent or emergency type symptoms.  Medication list was reconciled, printed and provided to the patient in AVS. Patient instructions and summary information was reviewed with the patient as documented in the AVS.  This note was prepared with assistance of Systems analyst. Occasional wrong-word or sound-a-like substitutions may have occurred due to the inherent limitations of voice recognition software  This visit occurred during the SARS-CoV-2 public health emergency.  Safety protocols were in place, including screening questions prior to the visit, additional usage of staff PPE, and extensive cleaning of exam room while observing appropriate contact time as indicated for disinfecting solutions.

## 2021-04-14 ENCOUNTER — Telehealth: Payer: Self-pay

## 2021-04-14 NOTE — Telephone Encounter (Signed)
Patient called today requesting lab results-I read her Dr. Tamela Oddi advice and she verbalized an understanding-FYI

## 2021-04-14 NOTE — Progress Notes (Signed)
Please call patient: I have reviewed his/her lab results.  Lab results look good.  No significant problem remains to be her diabetes.  I do feel that the uncontrolled diabetes could be contributing to her fatigue.  She is on mixed dose insulin; sometimes this limits the ability to get good control.  I recommend working with her endocrinologist to push her A1c lower if possible.  This may help her fatigue.  However, recommend starting the Lexapro as we discussed yesterday to help her mood.  All other labs are stable.  No other medication changes are needed at this time.

## 2021-04-18 NOTE — Progress Notes (Signed)
Chronic Care Management Pharmacy Note  04/27/2021 Name:  Jessica [name] MRN:  [id number] DOB:  08-09-1950  Summary: Initial visit with PharmD.  Patient had not started Lexapro - but is now willing to start.  Discussed importance of mental health on taking care of the rest of our health concerns.  All meds reviewed and list updated.  Recommendations/Changes made from today's visit: Start lexapro for depression symptoms Monitor glucose - could consider switch to Trulicity for patient assistance program, could also consider addition of Metformin in the future  Plan: FU 4 months   Subjective: Jessica [name] is an 70 y.o. year old female who is a primary patient of [name], MD.  The CCM team was consulted for assistance with disease management and care coordination needs.    Engaged with patient by telephone for initial visit in response to provider referral for pharmacy case management and/or care coordination services.   Consent to Services:  The patient was given the following information about Chronic Care Management services today, agreed to services, and gave verbal consent: 1. CCM service includes personalized support from designated clinical staff supervised by the primary care provider, including individualized plan of care and coordination with other care providers 2. 24/7 contact phone numbers for assistance for urgent and routine care needs. 3. Service will only be billed when office clinical staff spend 20 minutes or more in a month to coordinate care. 4. Only one practitioner may furnish and bill the service in a calendar month. 5.The patient may stop CCM services at any time (effective at the end of the month) by phone call to the office staff. 6. The patient will be responsible for cost sharing (co-pay) of up to 20% of the service fee (after annual deductible is met). Patient agreed to services and consent obtained.  Patient Care Team: [name], MD as PCP -  General (Family Medicine) Jettie Booze, MD as PCP - Cardiology (Cardiology) Olga Millers, MD as Attending Physician (Obstetrics and Gynecology) Renato Shin, MD as Consulting Physician (Endocrinology) Pyrtle, Lajuan Lines, MD as Consulting Physician (Gastroenterology) Stephannie Li, Daniels as Consulting Physician (Ophthalmology) Garrel Ridgel, DPM as Consulting Physician (Podiatry) Edythe Clarity, [facility] as Pharmacist (Pharmacist)  Recent office visits:  04/13/2021 OV (PCP) [name], MD;  recommend starting Lexapro daily   Recent consult visits:  03/10/2021 OV (MWM) [name] A, DO; no medication changes indicated.   02/09/2021 OV (MWM) [name] A, DO; no medication changes indicated.   02/09/2021 (Patient Message with Endocrinology) Renato Shin, MD; Increase Ozempic to 0.5 mg   01/12/2021 OV (MWM) [name] A, DO; no medication changes indicated.   12/28/2020 VV (Endocrinology) Renato Shin, MD; Please change the insulin to 40 units with breakfast, and 8 units with supper.   check your blood sugar twice a day.  vary the time of day when you check, between before the 3 meals, and at bedtime.  also check if you have symptoms of your blood sugar being too high or too low.  please keep a record of the readings and bring it to your next appointment here (or you can bring the meter itself).     12/01/2020 OV (MWM) [name] A, DO; no medication changes indicated.   11/18/2020 OV (Podiatry) Hytop, Max T, DPM; no medication changes indicated.   11/03/2020 OV (MWM) [name] A, DO; no medication changes indicated.   Hospital visits:  None in previous 6 months  Objective:  Lab Results  Component Value Date   CREATININE 0.98 04/13/2021   BUN 22 04/13/2021   GFR 58.47 (L) 04/13/2021   GFRNONAA 61 03/11/2020   GFRAA 70 03/11/2020   NA 138 04/13/2021   K 4.9 04/13/2021   CALCIUM 9.4 04/13/2021   CO2 29 04/13/2021   GLUCOSE 265 (H) 04/13/2021    Lab  Results  Component Value Date/Time   HGBA1C 7.7 (A) 04/13/2021 11:20 AM   HGBA1C 8.2 (A) 01/12/2021 10:28 AM   HGBA1C 6.7 (H) 07/24/2019 04:30 PM   HGBA1C 7.1 (H) 07/17/2017 04:13 PM   GFR 58.47 (L) 04/13/2021 11:49 AM   GFR 54.64 (L) 10/07/2020 11:35 AM   MICROALBUR 0.7 10/25/2017 02:14 PM   MICROALBUR 0.7 10/02/2016 03:21 PM    Last diabetic Eye exam:  Lab Results  Component Value Date/Time   HMDIABEYEEXA Retinopathy (A) 04/05/2020 12:00 AM    Last diabetic Foot exam: No results found for: HMDIABFOOTEX   Lab Results  Component Value Date   CHOL 135 04/13/2021   HDL 59.70 04/13/2021   LDLCALC 65 04/13/2021   LDLDIRECT 143.2 11/23/2006   TRIG 50.0 04/13/2021   CHOLHDL 2 04/13/2021    Hepatic Function Latest Ref Rng & Units 04/13/2021 10/07/2020 03/11/2020  Total Protein 6.0 - 8.3 g/dL 6.4 6.9 6.9  Albumin 3.5 - 5.2 g/dL 3.8 4.0 4.0  AST 0 - 37 U/L 14 15 15   ALT 0 - 35 U/L 16 14 14   Alk Phosphatase 39 - 117 U/L 71 72 93  Total Bilirubin 0.2 - 1.2 mg/dL 0.5 0.5 0.4  Bilirubin, Direct 0.0 - 0.3 mg/dL - - -    Lab Results  Component Value Date/Time   TSH 0.91 04/13/2021 11:49 AM   TSH 1.320 09/07/2020 12:31 PM   FREET4 1.18 09/07/2020 12:31 PM    CBC Latest Ref Rng & Units 04/13/2021 09/07/2020 10/28/2018  WBC 4.0 - 10.5 K/uL 3.3(L) - 4.8  Hemoglobin 12.0 - 15.0 g/dL 13.4 - 13.4  Hematocrit 36.0 - 46.0 % 40.2 42.6 40.4  Platelets 150.0 - 400.0 K/uL 193.0 - 260    Lab Results  Component Value Date/Time   VD25OH 44.70 04/13/2021 11:49 AM   VD25OH 85.0 09/07/2020 12:31 PM   VD25OH 61.6 03/11/2020 10:38 AM   VD25OH 29.57 (L) 11/03/2015 12:10 PM    Clinical ASCVD: No  The 10-year ASCVD risk score (Arnett DK, et al., 2019) is: 17.1%   Values used to calculate the score:     Age: 70 years     Sex: Female     Is Non-Hispanic African American: Yes     Diabetic: Yes     Tobacco smoker: No     Systolic Blood Pressure: 416 mmHg     Is BP treated: Yes     HDL  Cholesterol: 59.7 mg/dL     Total Cholesterol: 135 mg/dL    Depression screen [facility] 2/9 04/13/2021 02/24/2021 04/05/2020  Decreased Interest 2 0 2  Down, Depressed, Hopeless 1 0 2  PHQ - 2 Score 3 0 4  Altered sleeping 3 - 2  Tired, decreased energy 2 - 2  Change in appetite 1 - 0  Feeling bad or failure about yourself  0 - 0  Trouble concentrating 0 - 0  Moving slowly or fidgety/restless 1 - 0  Suicidal thoughts 0 - 0  PHQ-9 Score 10 - 8  Difficult doing work/chores Somewhat difficult - -  Some recent data might be hidden  Social History   Tobacco Use  Smoking Status Never  Smokeless Tobacco Never   BP Readings from Last 3 Encounters:  04/13/21 120/62  03/10/21 (!) 148/86  02/09/21 (!) 154/75   Pulse Readings from Last 3 Encounters:  04/13/21 78  03/10/21 75  02/09/21 69   Wt Readings from Last 3 Encounters:  04/13/21 214 lb 12.8 oz (97.4 kg)  03/10/21 214 lb (97.1 kg)  02/09/21 214 lb (97.1 kg)   BMI Readings from Last 3 Encounters:  04/13/21 35.74 kg/m  03/10/21 35.61 kg/m  02/09/21 35.61 kg/m    Assessment/Interventions: Review of patient past medical history, allergies, medications, health status, including review of consultants reports, laboratory and other test data, was performed as part of comprehensive evaluation and provision of chronic care management services.   SDOH:  (Social Determinants of Health) assessments and interventions performed: Yes  Financial Resource Strain: Low Risk    Difficulty of Paying Living Expenses: Not hard at all    SDOH Screenings   Alcohol Screen: Not on file  Depression (PHQ2-9): Medium Risk   PHQ-2 Score: 10  Financial Resource Strain: Low Risk    Difficulty of Paying Living Expenses: Not hard at all  Food Insecurity: No Food Insecurity   Worried About Charity fundraiser in the Last Year: Never true   Ran Out of Food in the Last Year: Never true  Housing: Low Risk    Last Housing Risk Score: 0  Physical  Activity: Sufficiently Active   Days of Exercise per Week: 7 days   Minutes of Exercise per Session: 60 min  Social Connections: Moderately Integrated   Frequency of Communication with Friends and Family: More than three times a week   Frequency of Social Gatherings with Friends and Family: Once a week   Attends Religious Services: More than 4 times per year   Active Member of [employer] or Organizations: No   Attends Archivist Meetings: Never   Marital Status: Married  Stress: Stress Concern Present   Feeling of Stress : To some extent  Tobacco Use: Low Risk    Smoking Tobacco Use: Never   Smokeless Tobacco Use: Never   Passive Exposure: Not on file  Transportation Needs: No Transportation Needs   Lack of Transportation (Medical): No   Lack of Transportation (Non-Medical): No    CCM Care Plan  Allergies  Allergen Reactions   Gluten Meal Other (See Comments)    No wheat or soy   Nsaids Nausea And Vomiting and Other (See Comments)    Cannot tolerate large doses    Soy Allergy    Wheat Bran     Medications Reviewed Today     Reviewed by Edythe Clarity, [facility] (Pharmacist) on 04/27/21 at 1148  Med List Status: <None>   Medication Order Taking? Sig Documenting Provider Last Dose Status Informant  albuterol (VENTOLIN HFA) 108 (90 Base) MCG/ACT inhaler [id number] Yes Inhale 2 puffs into the lungs every 6 (six) hours as needed for wheezing or shortness of breath. [name], MD Taking Active   aspirin EC 81 MG tablet [id number] Yes Take 81 mg by mouth daily. Swallow whole. [provider] Taking Active   atorvastatin (LIPITOR) 40 MG tablet [id number] Yes TAKE 1 TABLET BY MOUTH EVERY DAY [name], MD Taking Active   BD INSULIN SYRINGE U/F 31G X 5/16" 1 ML MISC [id number]  USE UP TO 3 TIMES A DAY Renato Shin, MD  Active   dexlansoprazole [facility])  60 MG capsule [id number] Yes Take 1 capsule (60 mg total) by mouth daily. D/c rx for lansoprazole; may try both  generic and name brand dexilant rx Pt will pay out of pocket. Insurance will not cover Pyrtle, Lajuan Lines, MD Taking Active   escitalopram (LEXAPRO) 10 MG tablet [id number] Yes Take 1 tablet (10 mg total) by mouth daily. [name], MD Taking Active   fluticasone [facility]) 50 MCG/ACT nasal spray [id number] Yes Place 1 spray into both nostrils daily. [name], MD Taking Active   furosemide (LASIX) 20 MG tablet [id number]  TAKE 1 TABLET (20 MG TOTAL) BY MOUTH DAILY AS NEEDED FOR FLUID OR EDEMA.  Patient taking differently: daily.   [name], MD  Active   gabapentin (NEURONTIN) 600 MG tablet [id number] Yes TAKE 1 TABLET IN THE MORNING AND 2 TABLETS AT BEDTIME Hyatt, Max T, DPM Taking Active   ibuprofen (ADVIL) 200 MG tablet [id number] Yes Take 200 mg by mouth daily. [provider] Taking Active Self  insulin NPH-regular Human (HUMULIN 70/30) (70-30) 100 UNIT/ML injection [id number] Yes 40 UNITS WITH BREAKFAST, AND 8 UNITS WITH SUPPER. Renato Shin, MD Taking Active   ketoconazole (NIZORAL) 2 % cream [id number] Yes APPLY TWICE A DAY FOR 2 WEEKS AS NEEDED FOR Arlee Muslim, MD Taking Active   olmesartan (BENICAR) 40 MG tablet [id number] Yes TAKE 1 TABLET BY MOUTH EVERY DAY [name], MD Taking Active   ondansetron (ZOFRAN ODT) 4 MG disintegrating tablet [id number] Yes Take 1 tablet (4 mg total) by mouth every 8 (eight) hours as needed for nausea or vomiting. Georgia Lopes, DO Taking Active   [facility] ULTRA test strip [id number]  USE 2 TIMES DAILY. AND LANCETS 2/DAY Renato Shin, MD  Active   polyethylene glycol powder ([facility] PURELAX) 17 GM/SCOOP powder [id number] Yes DISSOLVE 1 CAPFUL IN AT LEAST 8 OUNCES WATER/JUICE AND DRINK TWICE DAILY Esterwood, Amy S, PA-C Taking Active   Semaglutide,0.25 or 0.5MG/DOS, (OZEMPIC, 0.25 OR 0.5 MG/DOSE,) 2 MG/1.5ML SOPN [id number] Yes Inject 0.25 mg into the skin once a week. [name] A, DO Taking Active   sucralfate (CARAFATE) 1 GM/10ML  suspension [id number] Yes Take 10 mLs (1 g total) by mouth 4 (four) times daily. Jerene Bears, MD Taking Active   tizanidine (ZANAFLEX) 2 MG capsule [id number]  TAKE 1 CAPSULE (2 MG TOTAL) BY MOUTH AT BEDTIME AS NEEDED FOR MUSCLE SPASMS. Lyndal Pulley, DO  Active   valACYclovir (VALTREX) 500 MG tablet [id number] Yes TAKE 1 TABLET BY MOUTH EVERY DAY [name], MD Taking Active   Vitamin D, Ergocalciferol, (DRISDOL) 1.25 MG (50000 UNIT) CAPS capsule [id number]  TAKE 1 CAPSULE (50,000 UNITS TOTAL) BY MOUTH EVERY 7 (SEVEN) DAYS Dennard Nip D, MD  Active             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[place]) 04/05/2020   Bilateral lower extremity edema 01/20/2020   Thyroid nodule 10/28/2019   Abnormal stress test    Family history of malignant neoplasm of endometrium 10/08/2018   Celiac disease 02/14/2018   Osteopenia 02/14/2018   Mild intermittent asthma 02/14/2018   Diabetic neuropathy associated with type 2 diabetes mellitus ([place]) 02/14/2018   Class 2 obesity due to excess calories with body mass index (BMI) of 37.0 to 37.9 in adult 02/14/2018   Fibroma of tongue 06/06/2016   Renal calculi 01/13/2016  Degenerative arthritis of knee, bilateral 10/29/2015   Trigger point of right shoulder region 08/02/2015   Degenerative cervical disc 07/27/2015   OSA (obstructive sleep apnea) 02/28/2011   Constipation, slow transit 09/23/2010   Type 2 diabetes mellitus with complication, with long-term current use of insulin ([place]) 09/23/2010   FH: colon cancer 09/23/2010   Personal history of colonic polyps 09/23/2010   Combined hyperlipidemia associated with type 2 diabetes mellitus ([place]) 01/29/2009   Diabetic gastroparesis ([place]) 02/27/2007   Diabetic autonomic neuropathy associated with type 2 diabetes mellitus ([place]) 10/22/2006   Essential hypertension 10/22/2006    Immunization History  Administered Date(s)  Administered   Fluad Quad(high Dose 65+) 01/16/2019, 01/20/2020   Influenza,inj,Quad PF,6+ Mos 02/14/2018   PFIZER Comirnaty(Gray Top)Covid-19 Tri-Sucrose Vaccine 09/02/2020   PFIZER(Purple Top)SARS-COV-2 Vaccination 07/03/2019, 08/06/2019   Pneumococcal Conjugate-13 11/03/2015   Pneumococcal Polysaccharide-23 08/17/2010, 02/14/2018   Td 12/31/2002   Tdap 10/25/2017   Zoster Recombinat (Shingrix) 11/05/2018, 01/28/2019    Conditions to be addressed/monitored:  HTN, OSA, Asthma, Type II DM w/ neuropathy, HLD, Osteopenia, Obesity  Care Plan : General Pharmacy (Adult)  Updates made by Edythe Clarity, [place] since 04/27/2021 12:00 AM     Problem: HTN, OSA, Asthma, Type II DM w/ neuropathy, HLD, Osteopenia, Obesity   Priority: High  Onset Date: 04/27/2021     Goal: Patient-Specific Goal   Note:   Current Barriers:  Unable to independently afford treatment regimen Unable to achieve control of glucose   Pharmacist Clinical Goal(s):  Patient will achieve improvement in A1c as evidenced by labs through collaboration with PharmD and provider.   Interventions: 1:1 collaboration with [name], MD regarding development and update of comprehensive plan of care as evidenced by provider attestation and co-signature Inter-disciplinary care team collaboration (see longitudinal plan of care) Comprehensive medication review performed; medication list updated in electronic medical record  Hypertension (BP goal <130/80) -Controlled, based on most recent in office BP -Current treatment: Olmesartan 86m -Medications previously tried: valsartan, HCTZ  -Current home readings: not checking regularly -Current dietary habits: see DM -Current exercise habits: about 1-2 hours per day, averages 6500 steps per day -Denies hypotensive/hypertensive symptoms -Educated on BP goals and benefits of medications for prevention of heart attack, stroke and kidney damage; Exercise goal of 150 minutes per  week; Importance of home blood pressure monitoring; Symptoms of hypotension and importance of maintaining adequate hydration; -Counseled to monitor BP at home a few times per wek, document, and provide log at future appointments -Recommended to continue current medication Contact [place] consistently elevated  Hyperlipidemia: (LDL goal < 70) -Controlled -Current treatment: Atorvastatin 416mdaily -Medications previously tried: none noted  -Current dietary patterns: see DM -Current exercise habits: see HTN -Educated on Cholesterol goals;  Benefits of statin for ASCVD risk reduction; Importance of limiting foods high in cholesterol; -Recommended to continue current medication Most recent LDL is controled, no concerns with medication at this time  Diabetes w/ neuropathy(A1c goal <7%) -Not ideally controlled -Current medications: Ozempic 0.2564mnce weekly Humulin 70/30 40 units with breakfast and 8 units with supper Gabapentin 600m16mqam and 2hs -Medications previously tried: none noted  -Current home glucose readings fasting glucose: 140 today post prandial glucose: not checking regularly -Denies hypoglycemic/hyperglycemic symptoms -Current meal patterns:  breakfast: cereal, rice chex, eggs/turkey sausage  lunch: deli meat, vegetables, pickled beets, skinny pop, 90 calorie rice krispie treat  dinner: vegetables, meat, salmon/chicken, some red meat snacks: yogurt bar, apples drinks: occasional soda, water -Current  exercise: 6500 steps per day -Educated on A1c and blood sugar goals; Complications of diabetes including kidney damage, retinal damage, and cardiovascular disease; Prevention and management of hypoglycemic episodes; Benefits of routine self-monitoring of blood sugar; -Counseled to check feet daily and get yearly eye exams -Recommended to continue current medication Patient has celiac so managing diet has been complicated for her as well as tolerating increased doses  of Ozempic. Recommended she continue to work on diet, future plans cold be to add low dose metformin for better glucose control.  I would try not to increase Ozempic due to tolerability issues.  Consider Trulicity so we can help with copay assistance due to easier PAP program.  Celiac (Goal: Reduce symptoms) -Controlled -Current treatment  Dexilant 70m daily Caragate 1gm/118mprn -Medications previously tried: omeprazole  -Patient on very strict diet due to this - however diet as a whole seems pretty good. -Recommended to continue current medication Assessed patient finances. Dexilant copay high - will have patient assistance started for [employer].    Asthma (Goal: Prevent exacerbation) -Controlled -Current treatment  Albuterol HFA 9059mprn -Medications previously tried: none noted -rarely has to use  -Recommended to continue current medication Denies any recent episodes of SOB.  Depression?? (Goal: Reduce symptoms) -Not ideally controlled -Current treatment  Escitalopram 69m48mily -Medications previously tried: Wellbutrin  PHQ9 SCORE ONLY 04/13/2021 02/24/2021 04/05/2020  PHQ-9 Total Score 10 0 8  -Patient had never gotten prescription filled, she still has it at home.  Believes she is still having some symptoms of depression. We discussed and patient was willing to try medication.  Discussed that it would take about 2 weeks for full effect to take place.  Patient asked for a three week check in to determine efficacy of medication. Will have CMA call in 3 weeks to assess  Patient Goals/Self-Care Activities Patient will:  - take medications as prescribed as evidenced by patient report and record review check glucose daily, document, and provide at future appointments check blood pressure a few times per week, document, and provide at future appointments target a minimum of 150 minutes of moderate intensity exercise weekly  Follow Up Plan: The care management team will reach out  to the patient again over the next 120 days.       Medication Assistance: Application for Dexilant  medication assistance program. in process.  Anticipated assistance start date unknown.  See plan of care for additional detail.  Compliance/Adherence/Medication fill history: Care Gaps: N/A  Star-Rating Drugs: Atorvastatin 40 mg last filled 03/29/2021 90 DS Ozempic 0.25-0.5 mg last filled 03/11/2021 28 DS Humulin 70/30 last filled 04/04/2021 30 DS Olmesartan 40 mg last filled 03/30/2021 30 DS  Patient's [facility] pharmacy is:  [id number] [place] - [address] [place] [id number]: [phone] [phone] [id number]: [phone] discussed: Benefits of medication synchronization, packaging and delivery as well as enhanced pharmacist oversight with [employer]. Patient decided to: Continue current medication management strategy  Care Plan and Follow Up Patient Decision:  Patient agrees to Care Plan and Follow-up.  Plan: The care management team will reach out to the patient again over the next 120 days. ChrisBeverly MilchrmD Clinical Pharmacist  [facility])[phone]

## 2021-04-20 ENCOUNTER — Other Ambulatory Visit: Payer: Self-pay | Admitting: Family Medicine

## 2021-04-20 NOTE — Progress Notes (Deleted)
[name] [address] [place] Phone: [phone] Subjective:    I'm seeing this patient by the request  of:  [name], MD  CC:   IOE:[id number]  03/10/2021 Repeat injections given today, tolerated the procedure well, discussed icing regimen and home exercises, which activities to do which wants to avoid.  He is activity slowly.  Do believe that the differential includes cervical radiculopathy and encouraged to continue the gabapentin.  He may need to consider adding Cymbalta if continuing to have pain.  Follow-up with me again 6 weeks worsening pain to the call us sooner.  Worsening pain over the weekend seek medical attention.  Updated 04/21/2021 Jessica [name] is a 70 y.o. female coming in with complaint of right shoulder pain       Past Medical History:  Diagnosis Date   Anxiety    Arthritis    bilateral knees   Asthma    childhood   Autonomic neuropathy    diabetic   Cataract    Celiac disease    Constipation    Depression    Diabetes mellitus    type II, Hemoglobin A1C 9.9 10/05/2011   Diabetic neuropathy ([place])    Diverticulosis    Fatty liver    Gastroparesis    GERD (gastroesophageal reflux disease)    Hyperlipidemia    Hypertension    IBS (irritable bowel syndrome)    Kidney stones    Personal history of colonic polyps 03/2010   hyperplastic   Shingles 2021   Past Surgical History:  Procedure Laterality Date   APPENDECTOMY     CATARACT EXTRACTION Right 04/29/2018   CATARACT EXTRACTION Left 03/2018   COLONOSCOPY     DILATATION & CURRETTAGE/HYSTEROSCOPY WITH RESECTOCOPE N/A 03/24/2013   Procedure: DILATATION & CURETTAGE, HYSTEROSCOPY WITH RESECTION;  Surgeon: Olga Millers, MD;  Location: [facility];  Service: Gynecology;  Laterality: N/A;   HYSTEROSCOPY WITH D & C N/A 11/08/2015   Procedure: DILATATION AND CURETTAGE /HYSTEROSCOPY;  Surgeon: Olga Millers, MD;  Location: [facility];  Service:  Gynecology;  Laterality: N/A;   left breast cyst removal  2000   LEFT HEART CATH AND CORONARY ANGIOGRAPHY N/A 10/31/2018   Procedure: LEFT HEART CATH AND CORONARY ANGIOGRAPHY;  Surgeon: Jettie Booze, MD;  Location: 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   Occupation: retired  Tobacco Use   Smoking status: Never   Smokeless tobacco: Never  Scientific laboratory technician Use: Never used  Substance and Sexual Activity   Alcohol use: No   Drug use: No   Sexual activity: Yes    Birth control/protection: Post-menopausal, Surgical  Other Topics Concern   Not on file  Social History Narrative   Not on file   Social Determinants of Health   Financial Resource Strain: Low Risk    Difficulty of Paying Living Expenses: Not hard at all  Food Insecurity: No Food Insecurity   Worried About Charity fundraiser in the Last Year: Never true   [employer]  in the Last Year: Never true  Transportation Needs: No Transportation Needs   Lack of Transportation (Medical): No   Lack of Transportation (Non-Medical): No  Physical Activity: Sufficiently Active   Days of Exercise per Week: 7 days   Minutes of Exercise per Session: 60 min  Stress: Stress Concern Present   Feeling of Stress : To some extent  Social Connections: Moderately Integrated   Frequency of Communication with Friends and Family: More than three times a week   Frequency of Social Gatherings with Friends and Family: Once a week   Attends Religious Services: More than 4 times per year   Active Member of [employer] or Organizations: No   Attends Music therapist: Never    Marital Status: Married   Allergies  Allergen Reactions   Gluten Meal Other (See Comments)    No wheat or soy   Nsaids Nausea And Vomiting and Other (See Comments)    Cannot tolerate large doses    Soy Allergy    Wheat Bran    Family History  Problem Relation Age of Onset   Hypertension Mother    Colon cancer Sister        dx in her early 61s   Diabetes Sister    Endometrial cancer Sister    Hypertension Brother    Other Father        2 collapsed lungs   COPD Father    Diabetes Father    Heart attack Father    Heart failure Father    High blood pressure Father    Colon polyps Neg Hx    Esophageal cancer Neg Hx    Stomach cancer Neg Hx     Current Outpatient Medications (Endocrine & Metabolic):    insulin NPH-regular Human (HUMULIN 70/30) (70-30) 100 UNIT/ML injection, 40 UNITS WITH BREAKFAST, AND 8 UNITS WITH SUPPER.   Semaglutide,0.25 or 0.5MG/DOS, (OZEMPIC, 0.25 OR 0.5 MG/DOSE,) 2 MG/1.5ML SOPN, Inject 0.25 mg into the skin once a week.  Current Outpatient Medications (Cardiovascular):    atorvastatin (LIPITOR) 40 MG tablet, TAKE 1 TABLET BY MOUTH EVERY DAY   furosemide (LASIX) 20 MG tablet, TAKE 1 TABLET (20 MG TOTAL) BY MOUTH DAILY AS NEEDED FOR FLUID OR EDEMA. (Patient taking differently: daily.)   olmesartan (BENICAR) 40 MG tablet, TAKE 1 TABLET BY MOUTH EVERY DAY  Current Outpatient Medications (Respiratory):    albuterol (VENTOLIN HFA) 108 (90 Base) MCG/ACT inhaler, Inhale 2 puffs into the lungs every 6 (six) hours as needed for wheezing or shortness of breath.   fluticasone (FLONASE) 50 MCG/ACT nasal spray, Place 1 spray into both nostrils daily.  Current Outpatient Medications (Analgesics):    aspirin EC 81 MG tablet, Take 81 mg by mouth daily. Swallow whole.   ibuprofen (ADVIL) 200 MG tablet, Take 200 mg by mouth daily.   Current Outpatient Medications (Other):    BD INSULIN SYRINGE U/F 31G X 5/16" 1 ML MISC, USE UP TO 3 TIMES A DAY   dexlansoprazole  (DEXILANT) 60 MG capsule, Take 1 capsule (60 mg total) by mouth daily. D/c rx for lansoprazole; may try both generic and name brand dexilant rx Pt will pay out of pocket. Insurance will not cover   escitalopram (LEXAPRO) 10 MG tablet, Take 1 tablet (10 mg total) by mouth daily.   gabapentin (NEURONTIN) 600 MG tablet, TAKE 1 TABLET IN THE MORNING AND 2 TABLETS AT BEDTIME   ketoconazole (NIZORAL) 2 % cream, APPLY TWICE A DAY FOR 2 WEEKS AS  NEEDED FOR RASH   ondansetron (ZOFRAN ODT) 4 MG disintegrating tablet, Take 1 tablet (4 mg total) by mouth every 8 (eight) hours as needed for nausea or vomiting.   ONETOUCH ULTRA test strip, USE 2 TIMES DAILY. AND LANCETS 2/DAY   polyethylene glycol powder ([employer] PURELAX) 17 GM/SCOOP powder, DISSOLVE 1 CAPFUL IN AT LEAST 8 OUNCES WATER/JUICE AND DRINK TWICE DAILY   sucralfate (CARAFATE) 1 GM/10ML suspension, Take 10 mLs (1 g total) by mouth 4 (four) times daily.   tizanidine (ZANAFLEX) 2 MG capsule, TAKE 1 CAPSULE (2 MG TOTAL) BY MOUTH AT BEDTIME AS NEEDED FOR MUSCLE SPASMS.   valACYclovir (VALTREX) 500 MG tablet, TAKE 1 TABLET BY MOUTH EVERY DAY   Vitamin D, Ergocalciferol, (DRISDOL) 1.25 MG (50000 UNIT) CAPS capsule, TAKE 1 CAPSULE (50,000 UNITS TOTAL) BY MOUTH EVERY 7 (SEVEN) DAYS   Reviewed prior external information including notes and imaging from  primary care provider As well as notes that were available from care everywhere and other healthcare systems.  Past medical history, social, surgical and family history all reviewed in electronic medical record.  No pertanent information unless stated regarding to the chief complaint.   Review of Systems:  No headache, visual changes, nausea, vomiting, diarrhea, constipation, dizziness, abdominal pain, skin rash, fevers, chills, night sweats, weight loss, swollen lymph nodes, body aches, joint swelling, chest pain, shortness of breath, mood changes. POSITIVE muscle aches  Objective  There were no vitals taken  for this visit.   General: No apparent distress alert and oriented x3 mood and affect normal, dressed appropriately.  HEENT: Pupils equal, extraocular movements intact  Respiratory: Patient's speak in full sentences and does not appear short of breath  Cardiovascular: No lower extremity edema, non tender, no erythema  Gait normal with good balance and coordination.  MSK:  Non tender with full range of motion and good stability and symmetric strength and tone of shoulders, elbows, wrist, hip, knee and ankles bilaterally.     Impression and Recommendations:     The above documentation has been reviewed and is accurate and complete Belva Agee

## 2021-04-21 ENCOUNTER — Telehealth: Payer: Self-pay | Admitting: Pharmacist

## 2021-04-21 ENCOUNTER — Ambulatory Visit: Payer: Medicare Other | Admitting: Family Medicine

## 2021-04-21 ENCOUNTER — Ambulatory Visit (INDEPENDENT_AMBULATORY_CARE_PROVIDER_SITE_OTHER): Payer: Medicare Other | Admitting: Bariatrics

## 2021-04-21 NOTE — Chronic Care Management (AMB) (Signed)
Chronic Care Management Pharmacy Assistant   Name: Jessica [name]  MRN: [id number] DOB: 11/05/1950   Reason for Encounter: Chart Review For Initial Visit With Clinical Pharmacist   Conditions to be addressed/monitored: HTN, OSA, Asthma, DM II, Diabetic Neuropathy, HLD, Osteopenia  Primary concerns for visit include: HTN, HLD, DM II    Recent office visits:  04/13/2021 OV (PCP) [name], MD;  recommend starting Lexapro daily  Recent consult visits:  03/10/2021 OV (MWM) [name] A, DO; no medication changes indicated.  02/09/2021 OV (MWM) [name] A, DO; no medication changes indicated.  02/09/2021 (Patient Message with Endocrinology) Renato Shin, MD; Increase Ozempic to 0.5 mg  01/12/2021 OV (MWM) [name] A, DO; no medication changes indicated.  12/28/2020 VV (Endocrinology) Renato Shin, MD; Please change the insulin to 40 units with breakfast, and 8 units with supper.   check your blood sugar twice a day.  vary the time of day when you check, between before the 3 meals, and at bedtime.  also check if you have symptoms of your blood sugar being too high or too low.  please keep a record of the readings and bring it to your next appointment here (or you can bring the meter itself).    12/01/2020 OV (MWM) [name] A, DO; no medication changes indicated.  11/18/2020 OV (Podiatry) Pillager, Max T, DPM; no medication changes indicated.  11/03/2020 OV (MWM) [name] A, DO; no medication changes indicated.  Hospital visits:  None in previous 6 months  Medications: Outpatient Encounter Medications as of 04/21/2021  Medication Sig   albuterol (VENTOLIN HFA) 108 (90 Base) MCG/ACT inhaler Inhale 2 puffs into the lungs every 6 (six) hours as needed for wheezing or shortness of breath.   aspirin EC 81 MG tablet Take 81 mg by mouth daily. Swallow whole.   atorvastatin (LIPITOR) 40 MG tablet TAKE 1 TABLET BY MOUTH EVERY DAY   BD INSULIN SYRINGE U/F 31G X  5/16" 1 ML MISC USE UP TO 3 TIMES A DAY   dexlansoprazole (DEXILANT) 60 MG capsule Take 1 capsule (60 mg total) by mouth daily. D/c rx for lansoprazole; may try both generic and name brand dexilant rx Pt will pay out of pocket. Insurance will not cover   escitalopram (LEXAPRO) 10 MG tablet Take 1 tablet (10 mg total) by mouth daily.   fluticasone (FLONASE) 50 MCG/ACT nasal spray Place 1 spray into both nostrils daily.   furosemide (LASIX) 20 MG tablet TAKE 1 TABLET (20 MG TOTAL) BY MOUTH DAILY AS NEEDED FOR FLUID OR EDEMA. (Patient taking differently: daily.)   gabapentin (NEURONTIN) 600 MG tablet TAKE 1 TABLET IN THE MORNING AND 2 TABLETS AT BEDTIME   ibuprofen (ADVIL) 200 MG tablet Take 200 mg by mouth daily.   insulin NPH-regular Human (HUMULIN 70/30) (70-30) 100 UNIT/ML injection 40 UNITS WITH BREAKFAST, AND 8 UNITS WITH SUPPER.   ketoconazole (NIZORAL) 2 % cream APPLY TWICE A DAY FOR 2 WEEKS AS NEEDED FOR RASH   olmesartan (BENICAR) 40 MG tablet TAKE 1 TABLET BY MOUTH EVERY DAY   ondansetron (ZOFRAN ODT) 4 MG disintegrating tablet Take 1 tablet (4 mg total) by mouth every 8 (eight) hours as needed for nausea or vomiting.   ONETOUCH ULTRA test strip USE 2 TIMES DAILY. AND LANCETS 2/DAY   polyethylene glycol powder ([facility] PURELAX) 17 GM/SCOOP powder DISSOLVE 1 CAPFUL IN AT LEAST 8 OUNCES WATER/JUICE AND DRINK TWICE DAILY   Semaglutide,0.25 or 0.5MG/DOS, (OZEMPIC, 0.25  OR 0.5 MG/DOSE,) 2 MG/1.5ML SOPN Inject 0.25 mg into the skin once a week.   sucralfate (CARAFATE) 1 GM/10ML suspension Take 10 mLs (1 g total) by mouth 4 (four) times daily.   tizanidine (ZANAFLEX) 2 MG capsule TAKE 1 CAPSULE (2 MG TOTAL) BY MOUTH AT BEDTIME AS NEEDED FOR MUSCLE SPASMS.   valACYclovir (VALTREX) 500 MG tablet TAKE 1 TABLET BY MOUTH EVERY DAY   Vitamin D, Ergocalciferol, (DRISDOL) 1.25 MG (50000 UNIT) CAPS capsule TAKE 1 CAPSULE (50,000 UNITS TOTAL) BY MOUTH EVERY 7 (SEVEN) DAYS   No facility-administered encounter  medications on file as of 04/21/2021.   Current Medications: Tizanidine 2 mg last filled 04/04/2021 30 DS Escitalopram 10 mg Aspirin 81 mg Vitamin D 1.25 mg last filled 04/04/2021 27 DS Atorvastatin 40 mg last filled 03/29/2021 90 DS Ozempic 0.25-0.5 mg last filled 03/11/2021 28 DS Zofran 4 mg last filled 03/10/2021 6 DS Polyethylene Glycol Powder last filled 04/16/2021 30 DS Gabapentin 600 mg last filled 02/07/2021 90 DS Humulin 70/30 last filled 04/04/2021 30 DS Valacyclovir 500 mg last filled 01/23/2021 90 DS Dexlansoprazole 60 mg last filled 04/01/2021 30 DS Flonase nasal spray last filled 08/25/2020 16 DS Furosemide 20 mg last filled 02/13/2021 90 DS Olmesartan 40 mg last filled 03/30/2021 30 DS Sucralfate 1 mg/10 mL last filled 05/21/2020 10 DS Ketoconazole 2% cream last filled 04/13/2020 14 DS Albuterol 108 mcg last filled 10/13/2019 30 DS Onetouch Ultra Test Strip Ibuprofen 200 mg  Patient Questions: Any changes in your medications or health? Patient states she hasn't had any recent changes in her medications or health.  Any side effects from any medications?  Patient states Ozempic "messes with her stomach" due to celiac disease although she takes nausea medicine.  Do you have any symptoms or problems not managed by your medications? Patient states she has a lot of fatigue. She thinks it may be due to her stress level. She states her husband is sick so she don't rest well at night.  Any concerns about your health right now? Patient states she has concerns about her A1c and celiac disease.  Has your provider asked that you check blood pressure, blood sugar, or follow special diet at home? She checks her blood sugars at home. She tries her best to follow a diabetic diet.  Do you get any type of exercise on a regular basis? Patient states she exercises 1-2 hours a day. She states she gets about 6500 steps a day.  Can you think of a goal you would like to reach for  your health? Stop taking medications  Do you have any problems getting your medications? She states she pays >$200 a month for Ozempic. She tried to apply for assistance and was denied due to the total amount of income between both her and her husband.  Is there anything that you would like to discuss during the appointment?  Patient would like to discuss her medications and diabetes.  Please bring medications and supplements to appointment.  Due to having her family at her house she wishes to have a telephone appointment vs in person or face to face.  Care Gaps: Medicare Annual Wellness: Completed 02/24/2021 Ophthalmology Exam: Next due on 04/07/2022 Foot Exam: Next due on 10/01/2021 Hemoglobin A1C: 7.7% on 04/13/2021 Colonoscopy: Next due on 10/13/2024 Dexa Scan: Next due on 10/25/2025 Mammogram: Next due on 05/01/2021  Future Appointments  Date Time Provider [place]  04/26/2021 11:00 AM [facility] CCM PHARMACIST [facility]  05/03/2021 12:45  PM Lyndal Pulley, DO [facility] None  05/09/2021  8:40 AM Georgia Lopes, DO [facility] None  10/19/2021  1:30 PM [name], MD [facility]  03/13/2022  1:45 PM [facility] HEALTH COACH [facility]   Star Rating Drugs: Atorvastatin 40 mg last filled 03/29/2021 90 DS Ozempic 0.25-0.5 mg last filled 03/11/2021 28 DS Humulin 70/30 last filled 04/04/2021 30 DS Olmesartan 40 mg last filled 03/30/2021 30 DS  April D Calhoun, [place] Pharmacist Assistant [phone]

## 2021-04-26 ENCOUNTER — Ambulatory Visit (INDEPENDENT_AMBULATORY_CARE_PROVIDER_SITE_OTHER): Payer: Medicare Other | Admitting: Pharmacist

## 2021-04-26 DIAGNOSIS — K900 Celiac disease: Secondary | ICD-10-CM

## 2021-04-26 DIAGNOSIS — E1169 Type 2 diabetes mellitus with other specified complication: Secondary | ICD-10-CM

## 2021-04-26 DIAGNOSIS — E1142 Type 2 diabetes mellitus with diabetic polyneuropathy: Secondary | ICD-10-CM

## 2021-04-26 DIAGNOSIS — Z794 Long term (current) use of insulin: Secondary | ICD-10-CM

## 2021-04-26 DIAGNOSIS — I10 Essential (primary) hypertension: Secondary | ICD-10-CM

## 2021-04-27 NOTE — Patient Instructions (Addendum)
Visit Information   Goals Addressed             This Visit's Progress    Set My Target A1C-Diabetes Type 2       Timeframe:  Long-Range Goal Priority:  High Start Date:  04/27/21                           Expected End Date: 10/26/21                      Follow Up Date 07/26/21    - set target A1C    Why is this important?   Your target A1C is decided together by you and your doctor.  It is based on several things like your age and other health issues.    Notes: A1c < 7       Patient Care Plan: General Pharmacy (Adult)     Problem Identified: HTN, OSA, Asthma, Type II DM w/ neuropathy, HLD, Osteopenia, Obesity   Priority: High  Onset Date: 04/27/2021     Goal: Patient-Specific Goal   Note:   Current Barriers:  Unable to independently afford treatment regimen Unable to achieve control of glucose   Pharmacist Clinical Goal(s):  Patient will achieve improvement in A1c as evidenced by labs through collaboration with PharmD and provider.   Interventions: 1:1 collaboration with [name], MD regarding development and update of comprehensive plan of care as evidenced by provider attestation and co-signature Inter-disciplinary care team collaboration (see longitudinal plan of care) Comprehensive medication review performed; medication list updated in electronic medical record  Hypertension (BP goal <130/80) -Controlled, based on most recent in office BP -Current treatment: Olmesartan 74m -Medications previously tried: valsartan, HCTZ  -Current home readings: not checking regularly -Current dietary habits: see DM -Current exercise habits: about 1-2 hours per day, averages 6500 steps per day -Denies hypotensive/hypertensive symptoms -Educated on BP goals and benefits of medications for prevention of heart attack, stroke and kidney damage; Exercise goal of 150 minutes per week; Importance of home blood pressure monitoring; Symptoms of hypotension and  importance of maintaining adequate hydration; -Counseled to monitor BP at home a few times per wek, document, and provide log at future appointments -Recommended to continue current medication Contact [place] consistently elevated  Hyperlipidemia: (LDL goal < 70) -Controlled -Current treatment: Atorvastatin 43mdaily -Medications previously tried: none noted  -Current dietary patterns: see DM -Current exercise habits: see HTN -Educated on Cholesterol goals;  Benefits of statin for ASCVD risk reduction; Importance of limiting foods high in cholesterol; -Recommended to continue current medication Most recent LDL is controled, no concerns with medication at this time  Diabetes w/ neuropathy(A1c goal <7%) -Not ideally controlled -Current medications: Ozempic 0.2535mnce weekly Humulin 70/30 40 units with breakfast and 8 units with supper Gabapentin 600m14mqam and 2hs -Medications previously tried: none noted  -Current home glucose readings fasting glucose: 140 today post prandial glucose: not checking regularly -Denies hypoglycemic/hyperglycemic symptoms -Current meal patterns:  breakfast: cereal, rice chex, eggs/turkey sausage  lunch: deli meat, vegetables, pickled beets, skinny pop, 90 calorie rice krispie treat  dinner: vegetables, meat, salmon/chicken, some red meat snacks: yogurt bar, apples drinks: occasional soda, water -Current exercise: 6500 steps per day -Educated on A1c and blood sugar goals; Complications of diabetes including kidney damage, retinal damage, and cardiovascular disease; Prevention and management of hypoglycemic episodes; Benefits of routine self-monitoring of blood sugar; -Counseled to check feet  daily and get yearly eye exams -Recommended to continue current medication Patient has celiac so managing diet has been complicated for her as well as tolerating increased doses of Ozempic. Recommended she continue to work on diet, future plans cold be to add  low dose metformin for better glucose control.  I would try not to increase Ozempic due to tolerability issues.  Consider Trulicity so we can help with copay assistance due to easier PAP program.  Celiac (Goal: Reduce symptoms) -Controlled -Current treatment  Dexilant 34m daily Caragate 1gm/169mprn -Medications previously tried: omeprazole  -Patient on very strict diet due to this - however diet as a whole seems pretty good. -Recommended to continue current medication Assessed patient finances. Dexilant copay high - will have patient assistance started for [employer].    Asthma (Goal: Prevent exacerbation) -Controlled -Current treatment  Albuterol HFA 9074mprn -Medications previously tried: none noted -rarely has to use  -Recommended to continue current medication Denies any recent episodes of SOB.  Depression?? (Goal: Reduce symptoms) -Not ideally controlled -Current treatment  Escitalopram 24m74mily -Medications previously tried: Wellbutrin  PHQ9 SCORE ONLY 04/13/2021 02/24/2021 04/05/2020  PHQ-9 Total Score 10 0 8  -Patient had never gotten prescription filled, she still has it at home.  Believes she is still having some symptoms of depression. We discussed and patient was willing to try medication.  Discussed that it would take about 2 weeks for full effect to take place.  Patient asked for a three week check in to determine efficacy of medication. Will have CMA call in 3 weeks to assess  Patient Goals/Self-Care Activities Patient will:  - take medications as prescribed as evidenced by patient report and record review check glucose daily, document, and provide at future appointments check blood pressure a few times per week, document, and provide at future appointments target a minimum of 150 minutes of moderate intensity exercise weekly  Follow Up Plan: The care management team will reach out to the patient again over the next 120 days.      Ms. ScalSoohoo given  information about Chronic Care Management services today including:  CCM service includes personalized support from designated clinical staff supervised by her physician, including individualized plan of care and coordination with other care providers 24/7 contact phone numbers for assistance for urgent and routine care needs. Standard insurance, coinsurance, copays and deductibles apply for chronic care management only during months in which we provide at least 20 minutes of these services. Most insurances cover these services at 100%, however patients may be responsible for any copay, coinsurance and/or deductible if applicable. This service may help you avoid the need for more expensive face-to-face services. Only one practitioner may furnish and bill the service in a calendar month. The patient may stop CCM services at any time (effective at the end of the month) by phone call to the office staff.  Patient agreed to services and verbal consent obtained.   The patient verbalized understanding of instructions, educational materials, and care plan provided today and agreed to receive a mailed copy of patient instructions, educational materials, and care plan.  Telephone follow up appointment with pharmacy team member scheduled for: 120 days  ChriEdythe ClarityH SawpitarmD Clinical Pharmacist  [phone]

## 2021-04-28 ENCOUNTER — Encounter (INDEPENDENT_AMBULATORY_CARE_PROVIDER_SITE_OTHER): Payer: Self-pay

## 2021-04-28 ENCOUNTER — Other Ambulatory Visit (INDEPENDENT_AMBULATORY_CARE_PROVIDER_SITE_OTHER): Payer: Self-pay | Admitting: Bariatrics

## 2021-04-28 DIAGNOSIS — E1169 Type 2 diabetes mellitus with other specified complication: Secondary | ICD-10-CM

## 2021-04-28 NOTE — Progress Notes (Signed)
[facility] [place] [facility] Phone: [phone] Subjective:   [name], am serving as a scribe for Dr. Hulan Saas. This visit occurred during the SARS-CoV-2 public health emergency.  Safety protocols were in place, including screening questions prior to the visit, additional usage of staff PPE, and extensive cleaning of exam room while observing appropriate contact time as indicated for disinfecting solutions.  I'm seeing this patient by the request  of:  [name], MD  CC: Neck and shoulder pain follow-up  [id number]  03/10/2021 Repeat injections given today, tolerated the procedure well, discussed icing regimen and home exercises, which activities to do which wants to avoid.  He is activity slowly.  Do believe that the differential includes cervical radiculopathy and encouraged to continue the gabapentin.  He may need to consider adding Cymbalta if continuing to have pain.  Follow-up with me again 6 weeks worsening pain to the call us sooner.  Worsening pain over the weekend seek medical attention.  Updated 05/03/2021 Jessica [name] is a 70 y.o. female coming in with complaint of right shoulder pain. Patient states that when we experienced cold weather that her neck bothered her but her pain has since subsided. Feels that the exercises have helped.     Past Medical History:  Diagnosis Date   Anxiety    Arthritis    bilateral knees   Asthma    childhood   Autonomic neuropathy    diabetic   Cataract    Celiac disease    Constipation    Depression    Diabetes mellitus    type II, Hemoglobin A1C 9.9 10/05/2011   Diabetic neuropathy (HCC)    Diverticulosis    Fatty liver    Gastroparesis    GERD (gastroesophageal reflux disease)    Hyperlipidemia    Hypertension    IBS (irritable bowel syndrome)    Kidney stones    Personal history of colonic polyps 03/2010   hyperplastic   Shingles 2021    Allergies   Allergen Reactions   Gluten Meal Other (See Comments)    No wheat or soy   Nsaids Nausea And Vomiting and Other (See Comments)    Cannot tolerate large doses    Soy Allergy    Wheat Bran      Review of Systems:  No headache, visual changes, nausea, vomiting, diarrhea, constipation, dizziness, abdominal pain, skin rash, fevers, chills, night sweats, weight loss, swollen lymph nodes, body aches, joint swelling, chest pain, shortness of breath, mood changes. POSITIVE muscle aches  Objective  Blood pressure 120/66, pulse 72, height 5' 5"  (1.651 m), weight 213 lb (96.6 kg), SpO2 97 %.   General: No apparent distress alert and oriented x3 mood and affect normal, dressed appropriately.  HEENT: Pupils equal, extraocular movements intact  Respiratory: Patient's speak in full sentences and does not appear short of breath  Cardiovascular: No lower extremity edema, non tender, no erythema  Neck exam does have some mild loss of lordosis but improvement in flexion and extension.  Patient does have a negative Spurling's noted today.  Patient does still tender to palpation over the right trapezius muscle.  No tender points are noted.     Assessment and Plan:         The above documentation has been reviewed and is accurate and complete Lyndal Pulley, DO        Note: This dictation was prepared with Dragon dictation along  with smaller phrase technology. Any transcriptional errors that result from this process are unintentional.

## 2021-04-28 NOTE — Telephone Encounter (Signed)
Sent patient a message via [employer].

## 2021-04-30 DIAGNOSIS — E118 Type 2 diabetes mellitus with unspecified complications: Secondary | ICD-10-CM

## 2021-04-30 DIAGNOSIS — E1169 Type 2 diabetes mellitus with other specified complication: Secondary | ICD-10-CM

## 2021-04-30 DIAGNOSIS — E782 Mixed hyperlipidemia: Secondary | ICD-10-CM

## 2021-04-30 DIAGNOSIS — I10 Essential (primary) hypertension: Secondary | ICD-10-CM | POA: Diagnosis not present

## 2021-04-30 DIAGNOSIS — Z794 Long term (current) use of insulin: Secondary | ICD-10-CM

## 2021-04-30 DIAGNOSIS — E1142 Type 2 diabetes mellitus with diabetic polyneuropathy: Secondary | ICD-10-CM

## 2021-05-03 ENCOUNTER — Other Ambulatory Visit: Payer: Self-pay | Admitting: *Deleted

## 2021-05-03 ENCOUNTER — Ambulatory Visit (INDEPENDENT_AMBULATORY_CARE_PROVIDER_SITE_OTHER): Payer: Medicare Other | Admitting: Family Medicine

## 2021-05-03 ENCOUNTER — Ambulatory Visit: Payer: Self-pay

## 2021-05-03 ENCOUNTER — Encounter: Payer: Self-pay | Admitting: Family Medicine

## 2021-05-03 ENCOUNTER — Other Ambulatory Visit: Payer: Self-pay

## 2021-05-03 VITALS — BP 120/66 | HR 72 | Ht 65.0 in | Wt 213.0 lb

## 2021-05-03 DIAGNOSIS — M25511 Pain in right shoulder: Secondary | ICD-10-CM

## 2021-05-03 MED ORDER — DEXILANT 60 MG PO CPDR: 60.0000 mg | DELAYED_RELEASE_CAPSULE | Freq: Every day | ORAL | 3 refills | Status: DC

## 2021-05-03 NOTE — Assessment & Plan Note (Signed)
Patient has responded well to the trigger point injections previously.  Patient does have some degenerative disc disease of the cervical spine but feels that the trigger points have been the most helpful.  Discussed which activities to do and which ones to avoid.  Follow-up again in 6 to 8 weeks

## 2021-05-03 NOTE — Telephone Encounter (Signed)
Left msg for pt

## 2021-05-03 NOTE — Patient Instructions (Signed)
Have appt in 6-8 weeks We will watch the knee

## 2021-05-03 NOTE — Telephone Encounter (Signed)
Dr.Brown 

## 2021-05-09 ENCOUNTER — Encounter (INDEPENDENT_AMBULATORY_CARE_PROVIDER_SITE_OTHER): Payer: Self-pay | Admitting: Bariatrics

## 2021-05-09 ENCOUNTER — Ambulatory Visit (INDEPENDENT_AMBULATORY_CARE_PROVIDER_SITE_OTHER): Payer: Medicare Other | Admitting: Bariatrics

## 2021-05-09 ENCOUNTER — Telehealth: Payer: Self-pay | Admitting: Family Medicine

## 2021-05-09 ENCOUNTER — Other Ambulatory Visit: Payer: Self-pay

## 2021-05-09 VITALS — BP 145/86 | HR 80 | Temp 98.1°F | Ht 65.0 in | Wt 208.0 lb

## 2021-05-09 DIAGNOSIS — E559 Vitamin D deficiency, unspecified: Secondary | ICD-10-CM

## 2021-05-09 DIAGNOSIS — Z6834 Body mass index (BMI) 34.0-34.9, adult: Secondary | ICD-10-CM

## 2021-05-09 DIAGNOSIS — E1169 Type 2 diabetes mellitus with other specified complication: Secondary | ICD-10-CM

## 2021-05-09 DIAGNOSIS — E669 Obesity, unspecified: Secondary | ICD-10-CM

## 2021-05-09 MED ORDER — TRULICITY 0.75 MG/0.5ML ~~LOC~~ SOAJ: 0.7500 mg | SUBCUTANEOUS | 0 refills | Status: DC

## 2021-05-09 MED ORDER — VITAMIN D (ERGOCALCIFEROL) 1.25 MG (50000 UNIT) PO CAPS: 50000.0000 [IU] | ORAL_CAPSULE | ORAL | 0 refills | Status: DC

## 2021-05-09 MED ORDER — ONDANSETRON 4 MG PO TBDP: 4.0000 mg | ORAL_TABLET | Freq: Three times a day (TID) | ORAL | 0 refills | Status: DC | PRN

## 2021-05-09 NOTE — Progress Notes (Signed)
Chief Complaint:   OBESITY Jessica [name] is here to discuss her progress with her obesity treatment plan along with follow-up of her obesity related diagnoses. Jessica [name] is on the Category 3 Plan and states she is following her eating plan approximately 90% of the time. Jessica [name] states she is doing [place] for 60 minutes 7 times per week.  Today's visit was #: 68 Starting weight: 246 lbs Starting date: 03/12/2019 Today's weight: 208 lbs Today's date: 05/09/2021 Total lbs lost to date: 38 lbs Total lbs lost since last in-office visit: 6 lbs  Interim History: Jessica [name] is down 6 lbs and doing better. She has been sick at times.  Subjective:   1. Vitamin D deficiency Jessica [name] is taking her Vitamin D as directed.  2. Type 2 diabetes mellitus with obesity ([place]) Jessica [name] is taking Ozempic. A note from physician to possibly change to Trulicity due to cost.   Assessment/Plan:   1. Vitamin D deficiency Low Vitamin D level contributes to fatigue and are associated with obesity, breast, and colon cancer. We will refill prescription Vitamin D 50,000 IU every week for 1 month with no refills and she will follow-up for routine testing of Vitamin D, at least 2-3 times per year to avoid over-replacement. - Vitamin D, Ergocalciferol, (DRISDOL) 1.25 MG (50000 UNIT) CAPS capsule; Take 1 capsule (50,000 Units total) by mouth every 7 (seven) days.  Dispense: 4 capsule; Refill: 0  2. Type 2 diabetes mellitus with obesity ([place]) Jessica [name] will stop Ozempic. She agrees to start Trulicity 5.20 mg with no refills. We will refill Zofran 4 mg for nausea with no refills. Good blood sugar control is important to decrease the likelihood of diabetic complications such as nephropathy, neuropathy, limb loss, blindness, coronary artery disease, and death. Intensive lifestyle modification including diet, exercise and weight loss are the first line of treatment for diabetes.   - Dulaglutide (TRULICITY) 8.02 MV/3.6PQ SOPN; Inject 0.75 mg  into the skin once a week.  Dispense: 2 mL; Refill: 0 - ondansetron (ZOFRAN ODT) 4 MG disintegrating tablet; Take 1 tablet (4 mg total) by mouth every 8 (eight) hours as needed for nausea or vomiting.  Dispense: 20 tablet; Refill: 0  3. Obesity with current BMI of 34.6 Jessica [name] is currently in the action stage of change. As such, her goal is to continue with weight loss efforts. She has agreed to the Category 3 Plan.   Jessica [name] will continue meal planning and she will continue intentional eating.  Exercise goals:  As is.  Behavioral modification strategies: increasing lean protein intake, decreasing simple carbohydrates, increasing vegetables, increasing water intake, decreasing eating out, no skipping meals, meal planning and cooking strategies, keeping healthy foods in the home, and planning for success.  Jessica [name] has agreed to follow-up with our clinic in 3-4 weeks. She was informed of the importance of frequent follow-up visits to maximize her success with intensive lifestyle modifications for her multiple health conditions.   Objective:   Blood pressure (!) 145/86, pulse 80, temperature 98.1 F (36.7 C), height 5' 5"  (1.651 m), weight 208 lb (94.3 kg), SpO2 97 %. Body mass index is 34.61 kg/m.  General: Cooperative, alert, well developed, in no acute distress. HEENT: Conjunctivae and lids unremarkable. Cardiovascular: Regular rhythm.  Lungs: Normal work of breathing. Neurologic: No focal deficits.   Lab Results  Component Value Date   CREATININE 0.98 04/13/2021   BUN 22 04/13/2021   NA 138 04/13/2021   K 4.9 04/13/2021   CL 103 04/13/2021  CO2 29 04/13/2021   Lab Results  Component Value Date   ALT 16 04/13/2021   AST 14 04/13/2021   ALKPHOS 71 04/13/2021   BILITOT 0.5 04/13/2021   Lab Results  Component Value Date   HGBA1C 7.7 (A) 04/13/2021   HGBA1C 8.2 (A) 01/12/2021   HGBA1C 8.1 (A) 08/12/2020   HGBA1C 7.8 (A) 05/14/2020   HGBA1C 7.1 (A) 12/31/2019   No results  found for: INSULIN Lab Results  Component Value Date   TSH 0.91 04/13/2021   Lab Results  Component Value Date   CHOL 135 04/13/2021   HDL 59.70 04/13/2021   LDLCALC 65 04/13/2021   LDLDIRECT 143.2 11/23/2006   TRIG 50.0 04/13/2021   CHOLHDL 2 04/13/2021   Lab Results  Component Value Date   VD25OH 44.70 04/13/2021   VD25OH 85.0 09/07/2020   VD25OH 61.6 03/11/2020   Lab Results  Component Value Date   WBC 3.3 (L) 04/13/2021   HGB 13.4 04/13/2021   HCT 40.2 04/13/2021   MCV 99.6 04/13/2021   PLT 193.0 04/13/2021   Lab Results  Component Value Date   IRON 113 09/07/2020   TIBC 276 09/07/2020   FERRITIN 149 09/07/2020   Attestation Statements:   Reviewed by clinician on day of visit: allergies, medications, problem list, medical history, surgical history, family history, social history, and previous encounter notes.   I, Lizbeth Bark, RMA, am acting as Location manager for [employer], DO.  I have reviewed the above documentation for accuracy and completeness, and I agree with the above. -  ,mec

## 2021-05-09 NOTE — Telephone Encounter (Signed)
Pt called in and stated she needs to speak with you when you have a moment

## 2021-05-09 NOTE — Telephone Encounter (Signed)
Contacted patient answered questions about Trulicity patient assistance

## 2021-05-12 ENCOUNTER — Other Ambulatory Visit: Payer: Self-pay | Admitting: Physician Assistant

## 2021-05-15 ENCOUNTER — Encounter (INDEPENDENT_AMBULATORY_CARE_PROVIDER_SITE_OTHER): Payer: Self-pay | Admitting: Bariatrics

## 2021-05-25 ENCOUNTER — Other Ambulatory Visit: Payer: Self-pay | Admitting: Endocrinology

## 2021-05-30 ENCOUNTER — Encounter: Payer: Self-pay | Admitting: Internal Medicine

## 2021-06-01 DIAGNOSIS — R638 Other symptoms and signs concerning food and fluid intake: Secondary | ICD-10-CM | POA: Insufficient documentation

## 2021-06-02 ENCOUNTER — Encounter: Payer: Self-pay | Admitting: Podiatry

## 2021-06-02 ENCOUNTER — Ambulatory Visit (INDEPENDENT_AMBULATORY_CARE_PROVIDER_SITE_OTHER): Payer: Medicare Other | Admitting: Podiatry

## 2021-06-02 ENCOUNTER — Ambulatory Visit (INDEPENDENT_AMBULATORY_CARE_PROVIDER_SITE_OTHER): Payer: Medicare Other

## 2021-06-02 ENCOUNTER — Other Ambulatory Visit: Payer: Self-pay

## 2021-06-02 DIAGNOSIS — M7750 Other enthesopathy of unspecified foot: Secondary | ICD-10-CM

## 2021-06-02 DIAGNOSIS — M19079 Primary osteoarthritis, unspecified ankle and foot: Secondary | ICD-10-CM | POA: Diagnosis not present

## 2021-06-02 DIAGNOSIS — M7751 Other enthesopathy of right foot: Secondary | ICD-10-CM

## 2021-06-02 DIAGNOSIS — G5791 Unspecified mononeuropathy of right lower limb: Secondary | ICD-10-CM

## 2021-06-02 NOTE — Progress Notes (Signed)
°  Subjective:  Patient ID: Jessica [name], female    DOB: [birth year],   MRN: [id number]  Chief Complaint  Patient presents with   Foot Pain       R foot pain top right side of foot shooting up to ankle and leg     71 y.o. female presents for concern of right foot pain on the top that is shooting up into the ankle.  Has a history of neuropathy for which she takes gabapentin. Relates stabbing pain shouting up her leg to her knee. Denies any injury. Relates it started yesterday after sitting for a period of time. Relates it was doing better but then started shooting again. She is diabetic and her last A1c was 7.7  . Denies any other pedal complaints. Denies n/v/f/c.   Past Medical History:  Diagnosis Date   Anxiety    Arthritis    bilateral knees   Asthma    childhood   Autonomic neuropathy    diabetic   Cataract    Celiac disease    Constipation    Depression    Diabetes mellitus    type II, Hemoglobin A1C 9.9 10/05/2011   Diabetic neuropathy (HCC)    Diverticulosis    Fatty liver    Gastroparesis    GERD (gastroesophageal reflux disease)    Hyperlipidemia    Hypertension    IBS (irritable bowel syndrome)    Kidney stones    Personal history of colonic polyps 03/2010   hyperplastic   Shingles 2021    Objective:  Physical Exam: Vascular: DP/PT pulses 2/4 bilateral. CFT <3 seconds. Normal hair growth on digits. Mild edema noted to the right lower extremity some edema noted on the left.  Skin. No lacerations or abrasions bilateral feet.  Musculoskeletal: MMT 5/5 bilateral lower extremities in DF, PF, Inversion and Eversion. Deceased ROM in DF of ankle joint.  Neurological: Sensation intact to light touch. Mild tenderness over the dorsal aspect of the foot. No pain with ROM.   Assessment:   1. Neuritis of right foot      Plan:  Patient was evaluated and treated and all questions answered. Discussed neuritis vs neuropahty vs radiculopathy and midfoot arthritis and  etiology as well as treatment with patient.  Radiographs reviewed and discussed with patient. No acute fractures or dislocations noted. Discussed NSAIDS, topicals, and possible injections.  Unable to do voltaren gel. Discussed other topicals  Recommend compression.  Unable to take NSAIDS and wants to avoid steroids.  Discussed stiff soled shoes and carbon fiber foot plate.   Patient to follow-up as needed.      Lorenda Peck, DPM

## 2021-06-07 ENCOUNTER — Emergency Department (HOSPITAL_COMMUNITY)
Admission: EM | Admit: 2021-06-07 | Discharge: 2021-06-08 | Disposition: A | Discharge status: Home / Self Care | Payer: Medicare Other | Attending: Emergency Medicine | Admitting: Emergency Medicine

## 2021-06-07 ENCOUNTER — Ambulatory Visit (INDEPENDENT_AMBULATORY_CARE_PROVIDER_SITE_OTHER): Payer: Medicare Other | Admitting: Family Medicine

## 2021-06-07 ENCOUNTER — Ambulatory Visit: Payer: Self-pay

## 2021-06-07 ENCOUNTER — Encounter (INDEPENDENT_AMBULATORY_CARE_PROVIDER_SITE_OTHER): Payer: Self-pay | Admitting: Bariatrics

## 2021-06-07 ENCOUNTER — Other Ambulatory Visit: Payer: Self-pay

## 2021-06-07 ENCOUNTER — Encounter: Payer: Self-pay | Admitting: Family Medicine

## 2021-06-07 ENCOUNTER — Emergency Department (HOSPITAL_BASED_OUTPATIENT_CLINIC_OR_DEPARTMENT_OTHER): Payer: Medicare Other

## 2021-06-07 ENCOUNTER — Ambulatory Visit (INDEPENDENT_AMBULATORY_CARE_PROVIDER_SITE_OTHER): Payer: Medicare Other | Admitting: Bariatrics

## 2021-06-07 VITALS — BP 128/70 | HR 88 | Ht 65.0 in | Wt 213.0 lb

## 2021-06-07 VITALS — BP 146/84 | HR 74 | Temp 98.1°F | Ht 65.0 in | Wt 209.0 lb

## 2021-06-07 DIAGNOSIS — E1169 Type 2 diabetes mellitus with other specified complication: Secondary | ICD-10-CM

## 2021-06-07 DIAGNOSIS — Z6841 Body Mass Index (BMI) 40.0 and over, adult: Secondary | ICD-10-CM

## 2021-06-07 DIAGNOSIS — Z794 Long term (current) use of insulin: Secondary | ICD-10-CM | POA: Diagnosis not present

## 2021-06-07 DIAGNOSIS — M79671 Pain in right foot: Secondary | ICD-10-CM | POA: Diagnosis present

## 2021-06-07 DIAGNOSIS — Z7985 Long-term (current) use of injectable non-insulin antidiabetic drugs: Secondary | ICD-10-CM

## 2021-06-07 DIAGNOSIS — E1140 Type 2 diabetes mellitus with diabetic neuropathy, unspecified: Secondary | ICD-10-CM | POA: Insufficient documentation

## 2021-06-07 DIAGNOSIS — Z6834 Body mass index (BMI) 34.0-34.9, adult: Secondary | ICD-10-CM

## 2021-06-07 DIAGNOSIS — E669 Obesity, unspecified: Secondary | ICD-10-CM | POA: Diagnosis not present

## 2021-06-07 DIAGNOSIS — R2241 Localized swelling, mass and lump, right lower limb: Secondary | ICD-10-CM | POA: Diagnosis not present

## 2021-06-07 DIAGNOSIS — Z7984 Long term (current) use of oral hypoglycemic drugs: Secondary | ICD-10-CM | POA: Diagnosis not present

## 2021-06-07 DIAGNOSIS — Z7982 Long term (current) use of aspirin: Secondary | ICD-10-CM | POA: Diagnosis not present

## 2021-06-07 DIAGNOSIS — I10 Essential (primary) hypertension: Secondary | ICD-10-CM | POA: Diagnosis not present

## 2021-06-07 DIAGNOSIS — M7989 Other specified soft tissue disorders: Secondary | ICD-10-CM | POA: Diagnosis not present

## 2021-06-07 NOTE — ED Provider Triage Note (Signed)
Emergency Medicine Provider Triage Evaluation Note  Jessica [name] , a 71 y.o. female  was evaluated in triage.  Pt complains of swelling to right ankle and lower extremity.  She reports that pain started last Thursday.  Pain was originally in her foot and has since radiated to her right lower leg.  Patient reports that swelling started on Friday.  Swelling has improved with ice and elevation.  Patient was seen at [facility] sports medicine earlier today and told to come to the emergency department for concern of DVT.  Patient did have x-ray of left foot and ankle performed on 06/02/2021.  Review of Systems  Positive: Pain and swelling to right ankle and lower leg Negative: Numbness, weakness  Physical Exam  BP (!) 156/118 (BP Location: Right Arm)    Pulse 96    Temp 99.1 F (37.3 C) (Oral)    Resp 16    SpO2 96%  Gen:   Awake, no distress   Resp:  Normal effort  MSK:   Moves extremities without difficulty; patient has swelling to right ankle and right lower leg.  Minimal tenderness to right calf and right lateral malleus. Other:  +2 DP pulse bilaterally.  Medical Decision Making  Medically screening exam initiated at 5:21 PM.  Appropriate orders placed.  Jessica [name] was informed that the remainder of the evaluation will be completed by another provider, this initial triage assessment does not replace that evaluation, and the importance of remaining in the ED until their evaluation is complete.     Jessica [name], [place] 06/07/21 1723

## 2021-06-07 NOTE — Patient Instructions (Signed)
Go to ER Concerned about a DVT Lets see what they show If all normal consider boot and compression socks

## 2021-06-07 NOTE — Progress Notes (Signed)
Chief Complaint:   OBESITY Francella is here to discuss her progress with her obesity treatment plan along with follow-up of her obesity related diagnoses. Simisola is on the Category 3 Plan and states she is following her eating plan approximately 95% of the time. Belisa states she is doing 0 minutes 0 times per week.  Today's visit was #: 42 Starting weight: 246 lbs Starting date: 03/12/2019 Today's weight: 209 lbs Today's date: 06/07/2021 Total lbs lost to date: 37 lbs Total lbs lost since last in-office visit: 0  Interim History: Ruba is up 1 lb since her last visit.  Subjective:   1. Type 2 diabetes mellitus with obesity ([place]) Armandina is taking Trulicity and Humulin 46/96. Trulicity was started at the last office visit.  2. Essential hypertension Joshalyn's blood pressure is reasonably well controlled. Her last blood pressure was 154/86.  Assessment/Plan:   1. Type 2 diabetes mellitus with obesity ([place]) Zayne will continue taking her medications. Good blood sugar control is important to decrease the likelihood of diabetic complications such as nephropathy, neuropathy, limb loss, blindness, coronary artery disease, and death. Intensive lifestyle modification including diet, exercise and weight loss are the first line of treatment for diabetes.   2. Essential hypertension Merideth will continue taking her medications. She is working on healthy weight loss and exercise to improve blood pressure control. We will watch for signs of hypotension as she continues her lifestyle modifications.  3. Obesity with current BMI of 34.8 Carrah is currently in the action stage of change. As such, her goal is to continue with weight loss efforts. She has agreed to the Category 3 Plan.   Zenda will continue meal planning. She will continue to adhere closely to the plan.  Exercise goals:  Paulett has ankle pain. She will get back to [facility] as tolerated.  Behavioral modification strategies:  increasing lean protein intake, decreasing simple carbohydrates, increasing vegetables, increasing water intake, decreasing eating out, no skipping meals, meal planning and cooking strategies, keeping healthy foods in the home, and planning for success.  Kamren has agreed to follow-up with our clinic in 4 weeks. She was informed of the importance of frequent follow-up visits to maximize her success with intensive lifestyle modifications for her multiple health conditions.   Objective:   Blood pressure (!) 146/84, pulse 74, temperature 98.1 F (36.7 C), height 5' 5"  (1.651 m), weight 209 lb (94.8 kg), SpO2 98 %. Body mass index is 34.78 kg/m.  General: Cooperative, alert, well developed, in no acute distress. HEENT: Conjunctivae and lids unremarkable. Cardiovascular: Regular rhythm.  Lungs: Normal work of breathing. Neurologic: No focal deficits.   Lab Results  Component Value Date   CREATININE 0.98 04/13/2021   BUN 22 04/13/2021   NA 138 04/13/2021   K 4.9 04/13/2021   CL 103 04/13/2021   CO2 29 04/13/2021   Lab Results  Component Value Date   ALT 16 04/13/2021   AST 14 04/13/2021   ALKPHOS 71 04/13/2021   BILITOT 0.5 04/13/2021   Lab Results  Component Value Date   HGBA1C 7.7 (A) 04/13/2021   HGBA1C 8.2 (A) 01/12/2021   HGBA1C 8.1 (A) 08/12/2020   HGBA1C 7.8 (A) 05/14/2020   HGBA1C 7.1 (A) 12/31/2019   No results found for: INSULIN Lab Results  Component Value Date   TSH 0.91 04/13/2021   Lab Results  Component Value Date   CHOL 135 04/13/2021   HDL 59.70 04/13/2021   LDLCALC 65 04/13/2021   LDLDIRECT  143.2 11/23/2006   TRIG 50.0 04/13/2021   CHOLHDL 2 04/13/2021   Lab Results  Component Value Date   VD25OH 44.70 04/13/2021   VD25OH 85.0 09/07/2020   VD25OH 61.6 03/11/2020   Lab Results  Component Value Date   WBC 3.3 (L) 04/13/2021   HGB 13.4 04/13/2021   HCT 40.2 04/13/2021   MCV 99.6 04/13/2021   PLT 193.0 04/13/2021   Lab Results  Component  Value Date   IRON 113 09/07/2020   TIBC 276 09/07/2020   FERRITIN 149 09/07/2020   Attestation Statements:   Reviewed by clinician on day of visit: allergies, medications, problem list, medical history, surgical history, family history, social history, and previous encounter notes.  I, Lizbeth Bark, RMA, am acting as Location manager for [employer], DO.  I have reviewed the above documentation for accuracy and completeness, and I agree with the above. [name], DO

## 2021-06-07 NOTE — ED Triage Notes (Signed)
Pt c/o R foot pain x4 days. Saw sports medicine, US performed & was advised to come to ED for further eval to rule out DVT/vessel rupture.

## 2021-06-07 NOTE — Progress Notes (Signed)
Lower extremity venous RT study completed.  Preliminary results relayed to [place], [place].  See CV Proc for preliminary results report.   Darlin Coco, RDMS, RVT

## 2021-06-07 NOTE — Progress Notes (Signed)
[name] [address] [place] Phone: [phone] Subjective:   IVilma Meckel, am serving as a scribe for Dr. Hulan Saas. This visit occurred during the SARS-CoV-2 public health emergency.  Safety protocols were in place, including screening questions prior to the visit, additional usage of staff PPE, and extensive cleaning of exam room while observing appropriate contact time as indicated for disinfecting solutions.   I'm seeing this patient by the request  of:  [name], MD  CC: Right leg swelling  FAO:[id number]  Verenise Moulin [name] is a 71 y.o. female coming in with complaint of R ankle pain. Patient states pain started last Thursday. Ankle has been swelling. Yesterday was the worse day. Location of pain started midfoot. Dorsi and plantarflexion hurts. Pain will be sharp and dull but then will ache and become sore. Tender to touch lower leg near lateral malleolus.  Patient feels like it is getting worse and not getting better.  Seems to even hurt her at baseline now at this time.  Past medical history significant for diabetes.  Patient denies any chest pain or shortness of breath      Past Medical History:  Diagnosis Date   Anxiety    Arthritis    bilateral knees   Asthma    childhood   Autonomic neuropathy    diabetic   Cataract    Celiac disease    Constipation    Depression    Diabetes mellitus    type II, Hemoglobin A1C 9.9 10/05/2011   Diabetic neuropathy ([place])    Diverticulosis    Fatty liver    Gastroparesis    GERD (gastroesophageal reflux disease)    Hyperlipidemia    Hypertension    IBS (irritable bowel syndrome)    Kidney stones    Personal history of colonic polyps 03/2010   hyperplastic   Shingles 2021   Past Surgical History:  Procedure Laterality Date   APPENDECTOMY     CATARACT EXTRACTION Right 04/29/2018   CATARACT EXTRACTION Left 03/2018   COLONOSCOPY     DILATATION &  CURRETTAGE/HYSTEROSCOPY WITH RESECTOCOPE N/A 03/24/2013   Procedure: DILATATION & CURETTAGE, HYSTEROSCOPY WITH RESECTION;  Surgeon: Olga Millers, MD;  Location: [facility];  Service: Gynecology;  Laterality: N/A;   HYSTEROSCOPY WITH D & C N/A 11/08/2015   Procedure: DILATATION AND CURETTAGE /HYSTEROSCOPY;  Surgeon: Olga Millers, MD;  Location: [facility];  Service: Gynecology;  Laterality: N/A;   left breast cyst removal  2000   LEFT HEART CATH AND CORONARY ANGIOGRAPHY N/A 10/31/2018   Procedure: LEFT HEART CATH AND CORONARY ANGIOGRAPHY;  Surgeon: Jettie Booze, MD;  Location: [facility];  Service: Cardiovascular;  Laterality: N/A;   TUBAL LIGATION     Social History   Socioeconomic History   Marital status: Married    Spouse name: Devar Normoyle   Number of children: 2   Years of education: Not on file   Highest education level: Not on file  Occupational History   Occupation: Scientist, 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   Occupation: retired  Tobacco Use   Smoking status: Never   Smokeless tobacco: Never  Scientific laboratory technician Use: Never used  Substance and Sexual Activity   Alcohol use: No   Drug use: No   Sexual activity: Yes    Birth control/protection: Post-menopausal, Surgical  Other Topics Concern   Not on  file  Social History Narrative   Not on file   Social Determinants of Health   Financial Resource Strain: Low Risk    Difficulty of Paying Living Expenses: Not hard at all  Food Insecurity: No Food Insecurity   Worried About Charity fundraiser in the Last Year: Never true   Arboriculturist in the Last Year: Never true  Transportation Needs: No Transportation Needs   Lack of Transportation (Medical): No   Lack of Transportation (Non-Medical): No  Physical Activity: Sufficiently Active   Days of Exercise per Week: 7 days   Minutes of Exercise per Session: 60 min  Stress: Stress Concern Present   Feeling of Stress : To  some extent  Social Connections: Moderately Integrated   Frequency of Communication with Friends and Family: More than three times a week   Frequency of Social Gatherings with Friends and Family: Once a week   Attends Religious Services: More than 4 times per year   Active Member of [employer] or Organizations: No   Attends Music therapist: Never   Marital Status: Married   Allergies  Allergen Reactions   Gluten Meal Other (See Comments)    No wheat or soy   Nsaids Nausea And Vomiting and Other (See Comments)    Cannot tolerate large doses    Soy Allergy    Wheat Bran    Family History  Problem Relation Age of Onset   Hypertension Mother    Colon cancer Sister        dx in her early 58s   Diabetes Sister    Endometrial cancer Sister    Hypertension Brother    Other Father        2 collapsed lungs   COPD Father    Diabetes Father    Heart attack Father    Heart failure Father    High blood pressure Father    Colon polyps Neg Hx    Esophageal cancer Neg Hx    Stomach cancer Neg Hx     Current Outpatient Medications (Endocrine & Metabolic):    Dulaglutide (TRULICITY) 0.94 BS/9.6GE SOPN, Inject 0.75 mg into the skin once a week.   insulin NPH-regular Human (HUMULIN 70/30) (70-30) 100 UNIT/ML injection, 40 UNITS WITH BREAKFAST, AND 8 UNITS WITH SUPPER.  Current Outpatient Medications (Cardiovascular):    atorvastatin (LIPITOR) 40 MG tablet, TAKE 1 TABLET BY MOUTH EVERY DAY   furosemide (LASIX) 20 MG tablet, TAKE 1 TABLET (20 MG TOTAL) BY MOUTH DAILY AS NEEDED FOR FLUID OR EDEMA. (Patient taking differently: daily.)   olmesartan (BENICAR) 40 MG tablet, TAKE 1 TABLET BY MOUTH EVERY DAY  Current Outpatient Medications (Respiratory):    albuterol (VENTOLIN HFA) 108 (90 Base) MCG/ACT inhaler, Inhale 2 puffs into the lungs every 6 (six) hours as needed for wheezing or shortness of breath.   fluticasone (FLONASE) 50 MCG/ACT nasal spray, Place 1 spray into both nostrils  daily.  Current Outpatient Medications (Analgesics):    aspirin EC 81 MG tablet, Take 81 mg by mouth daily. Swallow whole.   ibuprofen (ADVIL) 200 MG tablet, Take 200 mg by mouth daily.   Current Outpatient Medications (Other):    BD INSULIN SYRINGE U/F 31G X 5/16" 1 ML MISC, USE UP TO 3 TIMES A DAY   DEXILANT 60 MG capsule, Take 1 capsule (60 mg total) by mouth daily. Pt to pay out of pocket if not covered by insurance   escitalopram (LEXAPRO) 10 MG tablet,  Take 1 tablet (10 mg total) by mouth daily.   gabapentin (NEURONTIN) 600 MG tablet, TAKE 1 TABLET IN THE MORNING AND 2 TABLETS AT BEDTIME   ketoconazole (NIZORAL) 2 % cream, APPLY TWICE A DAY FOR 2 WEEKS AS NEEDED FOR RASH   ondansetron (ZOFRAN ODT) 4 MG disintegrating tablet, Take 1 tablet (4 mg total) by mouth every 8 (eight) hours as needed for nausea or vomiting.   ONETOUCH ULTRA test strip, USE 2 TIMES DAILY. AND LANCETS 2/DAY   polyethylene glycol powder ([employer] PURELAX) 17 GM/SCOOP powder, DISSOLVE 1 CAPFUL IN AT LEAST 8 OUNCES WATER/JUICE AND DRINK TWICE DAILY   sucralfate (CARAFATE) 1 GM/10ML suspension, Take 10 mLs (1 g total) by mouth 4 (four) times daily.   tizanidine (ZANAFLEX) 2 MG capsule, TAKE 1 CAPSULE (2 MG TOTAL) BY MOUTH AT BEDTIME AS NEEDED FOR MUSCLE SPASMS.   valACYclovir (VALTREX) 500 MG tablet, TAKE 1 TABLET BY MOUTH EVERY DAY   Vitamin D, Ergocalciferol, (DRISDOL) 1.25 MG (50000 UNIT) CAPS capsule, Take 1 capsule (50,000 Units total) by mouth every 7 (seven) days.   Review of Systems:  No headache, visual changes, nausea, vomiting, diarrhea, constipation, dizziness, abdominal pain, skin rash, fevers, chills, night sweats, weight loss, swollen lymph nodes, body aches, chest pain, shortness of breath, mood changes. POSITIVE muscle aches, joint swelling  Objective  Blood pressure 128/70, pulse 88, height 5' 5"  (1.651 m), weight 213 lb (96.6 kg), SpO2 97 %.   General: No apparent distress alert and oriented x3 mood  and affect normal, dressed appropriately. Appears uncomfortable  HEENT: Pupils equal, extraocular movements intact  Gait nantalgic  MSK: Patient's lower extremity into his hand appears to be more of edema noted in the lower extremity.  Patient does have a severe pain noted with compression of the calf as well as ankle.  Patient does have swelling more over the lateral aspect of the lateral malleolus.  No masses appreciated.  Patient does have some swelling over the dorsal aspect of the foot with some limited range of motion secondary to voluntary guarding as well.   Limited muscular skeletal ultrasound was performed and interpreted by Hulan Saas, M  Limited ultrasound does not show any cortical irregularity of the bone itself.  Patient does have what appears to be some hypoechoic changes soft tissue I have some difficulty with closing down the superficial veins. Impression and Recommendations:    The above documentation has been reviewed and is accurate and complete Lyndal Pulley, DO

## 2021-06-07 NOTE — Assessment & Plan Note (Signed)
Patient does have swelling seems to be significantly worse than the contralateral side at this point.  This seems to be worsening per patient.  He seems to be over the last 72 hours.  At this point we will send the patient to the emergency room with the pain management today.  Patient will get likely a Doppler there.  We discussed potentially trying to do this outpatient but would only be able to get a D-dimer and have to wait for tomorrow.  Patient was reluctant avoiding and I think it is a good idea to further evaluate in the emergency room.  Risk factors include hypertension and diabetes.

## 2021-06-08 ENCOUNTER — Telehealth: Payer: Self-pay | Admitting: Family Medicine

## 2021-06-08 ENCOUNTER — Encounter (INDEPENDENT_AMBULATORY_CARE_PROVIDER_SITE_OTHER): Payer: Self-pay | Admitting: Bariatrics

## 2021-06-08 ENCOUNTER — Encounter: Payer: Self-pay | Admitting: Family Medicine

## 2021-06-08 NOTE — ED Notes (Signed)
Pt has 1+ swelling of RLE and 2+ swelling of right foot and ankle. Pt has 2+ right pedal pulse, cap refill less than 3 sec, cool to touch, pt able to move foot.

## 2021-06-08 NOTE — Telephone Encounter (Signed)
Error

## 2021-06-08 NOTE — Discharge Instructions (Signed)
The ultrasound was negative for any DVT which is good news.  I would continue taking Advil for your foot pain.  Please follow-up with your primary care provider.  Return to the emergency department for worsening symptoms.

## 2021-06-08 NOTE — ED Provider Notes (Addendum)
[name] Provider Note   CSN: [id number] Arrival date & time: 06/07/21  1656     History Chief Complaint  Patient presents with   Foot Pain    Jessica [name] is a 71 y.o. female who presents to the emergency department with right foot pain over the last 4 days.  Patient was initially seen evaluated by her foot doctor who told her this was coming from her back. Imaging of the foot was performed and negative per the patient.  She then followed up with her sports medicine doctor yesterday who performed an bedside ultrasound and had a concern for possible blood clot and was sent to the emergency department for further evaluation.  Patient had an ultrasound ordered in triage and then waited in the waiting room for approximately 12 hours.  She currently rates her foot pain moderate in severity.   Foot Pain      Home Medications Prior to Admission medications   Medication Sig Start Date End Date Taking? Authorizing Provider  albuterol (VENTOLIN HFA) 108 (90 Base) MCG/ACT inhaler Inhale 2 puffs into the lungs every 6 (six) hours as needed for wheezing or shortness of breath. 10/13/19   [name], MD  aspirin EC 81 MG tablet Take 81 mg by mouth daily. Swallow whole.    [provider]  atorvastatin (LIPITOR) 40 MG tablet TAKE 1 TABLET BY MOUTH EVERY DAY 03/29/21   [name], MD  BD INSULIN SYRINGE U/F 31G X 5/16" 1 ML MISC USE UP TO 3 TIMES A DAY 05/25/21   Renato Shin, MD  DEXILANT 60 MG capsule Take 1 capsule (60 mg total) by mouth daily. Pt to pay out of pocket if not covered by insurance 05/03/21   Pyrtle, Lajuan Lines, MD  Dulaglutide (TRULICITY) 7.62 GB/1.5VV SOPN Inject 0.75 mg into the skin once a week. 05/09/21   [name] A, DO  escitalopram (LEXAPRO) 10 MG tablet Take 1 tablet (10 mg total) by mouth daily. 04/13/21   [name], MD  fluticasone (FLONASE) 50 MCG/ACT nasal spray Place 1 spray into both nostrils daily. 08/25/20    [name], MD  furosemide (LASIX) 20 MG tablet TAKE 1 TABLET (20 MG TOTAL) BY MOUTH DAILY AS NEEDED FOR FLUID OR EDEMA. Patient taking differently: daily. 08/12/20   [name], MD  gabapentin (NEURONTIN) 600 MG tablet TAKE 1 TABLET IN THE MORNING AND 2 TABLETS AT BEDTIME 03/02/21   Hyatt, Max T, DPM  ibuprofen (ADVIL) 200 MG tablet Take 200 mg by mouth daily.    [provider]  insulin NPH-regular Human (HUMULIN 70/30) (70-30) 100 UNIT/ML injection 40 UNITS WITH BREAKFAST, AND 8 UNITS WITH SUPPER. 12/28/20   Renato Shin, MD  ketoconazole (NIZORAL) 2 % cream APPLY TWICE A DAY FOR 2 WEEKS AS NEEDED FOR RASH 04/13/20   [name], MD  olmesartan (BENICAR) 40 MG tablet TAKE 1 TABLET BY MOUTH EVERY DAY 06/10/20   [name], MD  ondansetron (ZOFRAN ODT) 4 MG disintegrating tablet Take 1 tablet (4 mg total) by mouth every 8 (eight) hours as needed for nausea or vomiting. 05/09/21   [name] A, DO  ONETOUCH ULTRA test strip USE 2 TIMES DAILY. AND LANCETS 2/DAY 03/26/19   Renato Shin, MD  polyethylene glycol powder ([employer] PURELAX) 17 GM/SCOOP powder DISSOLVE 1 CAPFUL IN AT LEAST 8 OUNCES WATER/JUICE AND DRINK TWICE DAILY 05/12/21   Esterwood, Amy S, PA-C  sucralfate (  CARAFATE) 1 GM/10ML suspension Take 10 mLs (1 g total) by mouth 4 (four) times daily. 05/21/20   Pyrtle, Lajuan Lines, MD  tizanidine (ZANAFLEX) 2 MG capsule TAKE 1 CAPSULE (2 MG TOTAL) BY MOUTH AT BEDTIME AS NEEDED FOR MUSCLE SPASMS. 04/20/21   Lyndal Pulley, DO  valACYclovir (VALTREX) 500 MG tablet TAKE 1 TABLET BY MOUTH EVERY DAY 10/26/20   [name], MD  Vitamin D, Ergocalciferol, (DRISDOL) 1.25 MG (50000 UNIT) CAPS capsule Take 1 capsule (50,000 Units total) by mouth every 7 (seven) days. 05/09/21   [name] A, DO      Allergies    Gluten meal, Nsaids, Soy allergy, and Wheat bran    Review of Systems   Review of Systems  All other systems reviewed and are negative.  Physical Exam Updated Vital  Signs BP (!) 142/70 (BP Location: Left Arm)    Pulse 61    Temp 98.4 F (36.9 C) (Oral)    Resp 18    SpO2 97%  Physical Exam Vitals and nursing note reviewed.  Constitutional:      Appearance: Normal appearance.  HENT:     Head: Normocephalic and atraumatic.  Eyes:     General:        Right eye: No discharge.        Left eye: No discharge.     Conjunctiva/sclera: Conjunctivae normal.  Pulmonary:     Effort: Pulmonary effort is normal.  Musculoskeletal:     Comments: No specific tenderness over the foot or leg.  There is mild swelling at the ankle and foot.  Strong 2+ dorsalis pedis pulse felt on the right.  Normal sensation.  No varicosities.  Skin:    General: Skin is warm and dry.     Findings: No rash.  Neurological:     General: No focal deficit present.     Mental Status: She is alert.  Psychiatric:        Mood and Affect: Mood normal.        Behavior: Behavior normal.    ED Results / Procedures / Treatments   Labs (all labs ordered are listed, but only abnormal results are displayed) Labs Reviewed - No data to display  EKG None  Radiology VAS [place] LOWER EXTREMITY VENOUS (DVT) (ONLY [facility] & [facility])  Result Date: 06/07/2021  Lower Venous DVT Study Patient Name:  Jessica [name]  Date of Exam:   06/07/2021 Medical Rec #: [id number]        Accession #:    [id number] Date of Birth: 14-Sep-1950        Patient Gender: F Patient Age:   61 years Exam Location:  [facility] Procedure:      VAS [place] LOWER EXTREMITY VENOUS (DVT) Referring Phys: Debbe Mounts --------------------------------------------------------------------------------  Indications: Swelling RT ankle and foot.  Comparison Study: No prior studies. Performing Technologist: Darlin Coco RDMS, RVT  Examination Guidelines: A complete evaluation includes B-mode imaging, spectral Doppler, color Doppler, and power Doppler as needed of all accessible portions of each vessel. Bilateral testing is considered an integral part of  a complete examination. Limited examinations for reoccurring indications may be performed as noted. The reflux portion of the exam is performed with the patient in reverse Trendelenburg.  +---------+---------------+---------+-----------+----------+--------------+  RIGHT     Compressibility Phasicity Spontaneity Properties Thrombus Aging  +---------+---------------+---------+-----------+----------+--------------+  CFV       Full            Yes  Yes                                    +---------+---------------+---------+-----------+----------+--------------+  SFJ       Full                                                             +---------+---------------+---------+-----------+----------+--------------+  FV Prox   Full                                                             +---------+---------------+---------+-----------+----------+--------------+  FV Mid    Full                                                             +---------+---------------+---------+-----------+----------+--------------+  FV Distal Full                                                             +---------+---------------+---------+-----------+----------+--------------+  PFV       Full                                                             +---------+---------------+---------+-----------+----------+--------------+  POP       Full            Yes       Yes                                    +---------+---------------+---------+-----------+----------+--------------+  PTV       Full                                                             +---------+---------------+---------+-----------+----------+--------------+  PERO      Full                                                             +---------+---------------+---------+-----------+----------+--------------+  Gastroc   Full                                                             +---------+---------------+---------+-----------+----------+--------------+    +----+---------------+---------+-----------+----------+--------------+  LEFT Compressibility Phasicity Spontaneity Properties Thrombus Aging  +----+---------------+---------+-----------+----------+--------------+  CFV  Full            Yes       Yes                                    +----+---------------+---------+-----------+----------+--------------+     Summary: RIGHT: - There is no evidence of deep vein thrombosis in the lower extremity.  - No cystic structure found in the popliteal fossa.  LEFT: - No evidence of common femoral vein obstruction.  *See table(s) above for measurements and observations.    Preliminary     Procedures Procedures    Medications Ordered in ED Medications - No data to display  ED Course/ Medical Decision Making/ A&P                           Medical Decision Making  Jessica [name] is a 71 y.o. female with significant committees impacting care today including diabetes, diabetic neuropathy, hypertension, hyperlipidemia who presents the emergency department with a 4-day history of right foot pain.  Patient sent here for rule out DVT study.  This is obviously on my differential.  DVT study ordered in triage.  Interpreted by myself.  This was negative.  I agree with the preliminary report.  Formal evaluation to follow.  Patient takes Advil daily.  I offered patient naproxen which she declined.  I will have her follow-up with her primary care doctor for further evaluation.  She is safe for discharge.  All questions and concerns addressed.  Strict return precautions given.  Prior to discharge, patient was asking about blood work and why there was never any done. We had an extensive discussion revolving what the blood work would show with regard to a D-dimer, CBC, and BMP. Given that the swelling is only unilateral I do not feel this is a CHF picture and a BNP is not necessary. Patient has no significant risk factors for pulmonary embolism and has no clinical evidence of such  including tachycardia, shortness of breath, syncope, or pleuritic chest pain. Basic labs would not reveal much with respect to her leg pain.   Final Clinical Impression(s) / ED Diagnoses Final diagnoses:  Foot pain, right    Rx / DC Orders ED Discharge Orders     None         Hendricks Limes, PA-C 06/08/21 0828    Hendricks Limes, PA-C 06/08/21 2957    Tegeler, Gwenyth Allegra, MD 06/08/21 [phone]

## 2021-06-09 ENCOUNTER — Ambulatory Visit: Payer: Medicare Other | Admitting: Family Medicine

## 2021-06-09 ENCOUNTER — Ambulatory Visit (INDEPENDENT_AMBULATORY_CARE_PROVIDER_SITE_OTHER): Payer: Medicare Other

## 2021-06-09 ENCOUNTER — Encounter: Payer: Self-pay | Admitting: Family Medicine

## 2021-06-09 ENCOUNTER — Other Ambulatory Visit: Payer: Self-pay

## 2021-06-09 ENCOUNTER — Ambulatory Visit (INDEPENDENT_AMBULATORY_CARE_PROVIDER_SITE_OTHER): Payer: Medicare Other | Admitting: Family Medicine

## 2021-06-09 VITALS — Ht 65.0 in

## 2021-06-09 DIAGNOSIS — M2550 Pain in unspecified joint: Secondary | ICD-10-CM | POA: Diagnosis not present

## 2021-06-09 DIAGNOSIS — M25571 Pain in right ankle and joints of right foot: Secondary | ICD-10-CM

## 2021-06-09 DIAGNOSIS — R2241 Localized swelling, mass and lump, right lower limb: Secondary | ICD-10-CM

## 2021-06-09 LAB — COMPREHENSIVE METABOLIC PANEL
ALT: 14 U/L (ref 0–35)
AST: 16 U/L (ref 0–37)
Albumin: 3.9 g/dL (ref 3.5–5.2)
Alkaline Phosphatase: 69 U/L (ref 39–117)
BUN: 23 mg/dL (ref 6–23)
CO2: 33 mEq/L — ABNORMAL HIGH (ref 19–32)
Calcium: 9.7 mg/dL (ref 8.4–10.5)
Chloride: 102 mEq/L (ref 96–112)
Creatinine, Ser: 0.95 mg/dL (ref 0.40–1.20)
GFR: 60.62 mL/min (ref >60.00)
Glucose, Bld: 154 mg/dL — ABNORMAL HIGH (ref 70–99)
Potassium: 4.6 mEq/L (ref 3.5–5.1)
Sodium: 138 mEq/L (ref 135–145)
Total Bilirubin: 0.7 mg/dL (ref 0.2–1.2)
Total Protein: 6.9 g/dL (ref 6.0–8.3)

## 2021-06-09 LAB — CBC WITH DIFFERENTIAL/PLATELET
Basophils Absolute: 0 10*3/uL (ref 0.0–0.1)
Basophils Relative: 0.8 % (ref 0.0–3.0)
Eosinophils Absolute: 0.2 10*3/uL (ref 0.0–0.7)
Eosinophils Relative: 4.5 % (ref 0.0–5.0)
HCT: 43.7 % (ref 36.0–46.0)
Hemoglobin: 14.7 g/dL (ref 12.0–15.0)
Lymphocytes Relative: 46.9 % — ABNORMAL HIGH (ref 12.0–46.0)
Lymphs Abs: 1.6 10*3/uL (ref 0.7–4.0)
MCHC: 33.7 g/dL (ref 30.0–36.0)
MCV: 99 fl (ref 78.0–100.0)
Monocytes Absolute: 0.3 10*3/uL (ref 0.1–1.0)
Monocytes Relative: 8.4 % (ref 3.0–12.0)
Neutro Abs: 1.3 10*3/uL — ABNORMAL LOW (ref 1.4–7.7)
Neutrophils Relative %: 39.4 % — ABNORMAL LOW (ref 43.0–77.0)
Platelets: 214 10*3/uL (ref 150.0–400.0)
RBC: 4.42 Mil/uL (ref 3.87–5.11)
RDW: 12.6 % (ref 11.5–15.5)
WBC: 3.4 10*3/uL — ABNORMAL LOW (ref 4.0–10.5)

## 2021-06-09 LAB — SEDIMENTATION RATE: Sed Rate: 11 mm/hr (ref 0–30)

## 2021-06-09 NOTE — Patient Instructions (Signed)
Rotate ibuprofen and tylenol every 4 hours See you again in 2 weeks okay to double

## 2021-06-09 NOTE — Progress Notes (Signed)
[place] Phone: [phone] Subjective:   Jessica [name], am serving as a scribe for Dr. Hulan Saas. This visit occurred during the SARS-CoV-2 public health emergency.  Safety protocols were in place, including screening questions prior to the visit, additional usage of staff PPE, and extensive cleaning of exam room while observing appropriate contact time as indicated for disinfecting solutions.  I'm seeing this patient by the request  of:  [name], MD  CC: Right ankle pain  IOE:[id number]  Jessica [name] is a 71 y.o. female coming in with complaint of right ankle pain. Please see previous note.  Patient did have a increasing discomfort and pain and was concerned for DVT. To the emergency room which ultrasound was negative.  Patient continues to have swelling and pain in the ankle.  Difficulty with any type of ambulation.  Patient was placed in a cam walker by [facility] but continued to have discomfort.     Past Medical History:  Diagnosis Date   Anxiety    Arthritis    bilateral knees   Asthma    childhood   Autonomic neuropathy    diabetic   Cataract    Celiac disease    Constipation    Depression    Diabetes mellitus    type II, Hemoglobin A1C 9.9 10/05/2011   Diabetic neuropathy ([place])    Diverticulosis    Fatty liver    Gastroparesis    GERD (gastroesophageal reflux disease)    Hyperlipidemia    Hypertension    IBS (irritable bowel syndrome)    Kidney stones    Personal history of colonic polyps 03/2010   hyperplastic   Shingles 2021   Past Surgical History:  Procedure Laterality Date   APPENDECTOMY     CATARACT EXTRACTION Right 04/29/2018   CATARACT EXTRACTION Left 03/2018   COLONOSCOPY     DILATATION & CURRETTAGE/HYSTEROSCOPY WITH RESECTOCOPE N/A 03/24/2013   Procedure: DILATATION & CURETTAGE, HYSTEROSCOPY WITH RESECTION;  Surgeon: Olga Millers, MD;  Location: [facility];   Service: Gynecology;  Laterality: N/A;   HYSTEROSCOPY WITH D & C N/A 11/08/2015   Procedure: DILATATION AND CURETTAGE /HYSTEROSCOPY;  Surgeon: Olga Millers, MD;  Location: [facility];  Service: Gynecology;  Laterality: N/A;   left breast cyst removal  2000   LEFT HEART CATH AND CORONARY ANGIOGRAPHY N/A 10/31/2018   Procedure: LEFT HEART CATH AND CORONARY ANGIOGRAPHY;  Surgeon: Jettie Booze, MD;  Location: [facility];  Service: Cardiovascular;  Laterality: N/A;   TUBAL LIGATION     Social History   Socioeconomic History   Marital status: Married    Spouse name: Devar Bogue   Number of children: 2   Years of education: Not on file   Highest education level: Not on file  Occupational History   Occupation: Scientist, 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   Occupation: retired  Tobacco Use   Smoking status: Never   Smokeless tobacco: Never  Scientific laboratory technician Use: Never used  Substance and Sexual Activity   Alcohol use: [name]   Drug use: [name]   Sexual activity: Yes    Birth control/protection: Post-menopausal, Surgical  Other Topics Concern   Not on file  Social History Narrative   Not on file   Social Determinants of Health   Financial Resource Strain: Low Risk    Difficulty of Paying  Living Expenses: Not hard at all  Food Insecurity: [name] Food Insecurity   Worried About [place] in the Last Year: Never true   Ran Out of Food in the Last Year: Never true  Transportation Needs: [name] Transportation Needs   Lack of Transportation (Medical): [name]   Lack of Transportation (Non-Medical): [name]  Physical Activity: Sufficiently Active   Days of Exercise per Week: 7 days   Minutes of Exercise per Session: 60 min  Stress: Stress Concern Present   Feeling of Stress : To some extent  Social Connections: Moderately Integrated   Frequency of Communication with Friends and Family: More than three times a week   Frequency of Social Gatherings  with Friends and Family: Once a week   Attends Religious Services: More than 4 times per year   Active Member of [employer] or Organizations: [name]   Attends Music therapist: Never   Marital Status: Married   Allergies  Allergen Reactions   Gluten Meal Other (See Comments)    [name] wheat or soy   Nsaids Nausea And Vomiting and Other (See Comments)    Cannot tolerate large doses    Soy Allergy    Wheat Bran    Family History  Problem Relation Age of Onset   Hypertension Mother    Colon cancer Sister        dx in her early 68s   Diabetes Sister    Endometrial cancer Sister    Hypertension Brother    Other Father        2 collapsed lungs   COPD Father    Diabetes Father    Heart attack Father    Heart failure Father    High blood pressure Father    Colon polyps Neg Hx    Esophageal cancer Neg Hx    Stomach cancer Neg Hx     Current Outpatient Medications (Endocrine & Metabolic):    Dulaglutide (TRULICITY) 9.16 BW/4.6KZ SOPN, Inject 0.75 mg into the skin once a week.   insulin NPH-regular Human (HUMULIN 70/30) (70-30) 100 UNIT/ML injection, 40 UNITS WITH BREAKFAST, AND 8 UNITS WITH SUPPER.  Current Outpatient Medications (Cardiovascular):    atorvastatin (LIPITOR) 40 MG tablet, TAKE 1 TABLET BY MOUTH EVERY DAY   furosemide (LASIX) 20 MG tablet, TAKE 1 TABLET (20 MG TOTAL) BY MOUTH DAILY AS NEEDED FOR FLUID OR EDEMA. (Patient taking differently: daily.)   olmesartan (BENICAR) 40 MG tablet, TAKE 1 TABLET BY MOUTH EVERY DAY  Current Outpatient Medications (Respiratory):    albuterol (VENTOLIN HFA) 108 (90 Base) MCG/ACT inhaler, Inhale 2 puffs into the lungs every 6 (six) hours as needed for wheezing or shortness of breath.   fluticasone (FLONASE) 50 MCG/ACT nasal spray, Place 1 spray into both nostrils daily.  Current Outpatient Medications (Analgesics):    aspirin EC 81 MG tablet, Take 81 mg by mouth daily. Swallow whole.   ibuprofen (ADVIL) 200 MG tablet, Take 200 mg  by mouth daily.   Current Outpatient Medications (Other):    BD INSULIN SYRINGE U/F 31G X 5/16" 1 ML MISC, USE UP TO 3 TIMES A DAY   DEXILANT 60 MG capsule, Take 1 capsule (60 mg total) by mouth daily. Pt to pay out of pocket if not covered by insurance   escitalopram (LEXAPRO) 10 MG tablet, Take 1 tablet (10 mg total) by mouth daily.   gabapentin (NEURONTIN) 600 MG tablet, TAKE 1 TABLET IN THE MORNING AND 2 TABLETS AT BEDTIME  ketoconazole (NIZORAL) 2 % cream, APPLY TWICE A DAY FOR 2 WEEKS AS NEEDED FOR RASH   ondansetron (ZOFRAN ODT) 4 MG disintegrating tablet, Take 1 tablet (4 mg total) by mouth every 8 (eight) hours as needed for nausea or vomiting.   ONETOUCH ULTRA test strip, USE 2 TIMES DAILY. AND LANCETS 2/DAY   polyethylene glycol powder ([employer] PURELAX) 17 GM/SCOOP powder, DISSOLVE 1 CAPFUL IN AT LEAST 8 OUNCES WATER/JUICE AND DRINK TWICE DAILY   sucralfate (CARAFATE) 1 GM/10ML suspension, Take 10 mLs (1 g total) by mouth 4 (four) times daily.   tizanidine (ZANAFLEX) 2 MG capsule, TAKE 1 CAPSULE (2 MG TOTAL) BY MOUTH AT BEDTIME AS NEEDED FOR MUSCLE SPASMS.   valACYclovir (VALTREX) 500 MG tablet, TAKE 1 TABLET BY MOUTH EVERY DAY   Vitamin D, Ergocalciferol, (DRISDOL) 1.25 MG (50000 UNIT) CAPS capsule, Take 1 capsule (50,000 Units total) by mouth every 7 (seven) days.    Review of Systems:  [name] headache, visual changes, nausea, vomiting, diarrhea, constipation, dizziness, abdominal pain, skin rash, fevers, chills, night sweats, weight loss, swollen lymph nodes, body aches,  chest pain, shortness of breath, mood changes. POSITIVE muscle aches, joint swelling  Objective  Height 5' 5"  (1.651 m).   General: [name] apparent distress alert and oriented x3 mood and affect normal, dressed appropriately.  HEENT: Pupils equal, extraocular movements intact  Respiratory: Patient's speak in full sentences and does not appear short of breath  Cardiovascular: [name] lower extremity edema, non tender, [name]  erythema  Antalgic MSK: right ankle has significant swelling that seems to be more on the lateral aspect the ankle.  Patient does have severe pain to palpation.  Patient does have some moderate range of motion.  Pain seems to be over the lateral malleolus and the anterior ankle mortise.  Still having tenderness  Limited muscular skeletal ultrasound was performed and interpreted by Hulan Saas, M  Limited musculoskeletal ultrasound of patient's legs still show significant hypoechoic changes within the soft tissue consistent with edema.  Patient does have some cortical increase in hyperechoic changes. Impression: Questionable irregularity of the lateral malleolus with soft tissue swelling noted.    Impression and Recommendations:     The above documentation has been reviewed and is accurate and complete Lyndal Pulley, DO

## 2021-06-09 NOTE — Assessment & Plan Note (Signed)
Patient still on x-ray does not have any true fracture noted.  Patient does have mild cortical irregularity of the lateral malleolus that can be consistent with a nondisplaced fracture.  Patient was put in an Aircast today and hopefully this will be beneficial.  Discussed icing and home exercises.  Follow-up with me again in 6 to 8 weeks

## 2021-06-12 ENCOUNTER — Encounter: Payer: Self-pay | Admitting: Family Medicine

## 2021-06-15 ENCOUNTER — Telehealth: Payer: Self-pay | Admitting: Pharmacist

## 2021-06-15 NOTE — Progress Notes (Signed)
Chronic Care Management Pharmacy Assistant   Name: Jessica [name]  MRN: [id number] DOB: 17-Jun-1950   Reason for Encounter: General Adherence Call   Recent office visits:  None  Recent consult visits:  06/09/2021 OV (Sports Medicine) Lyndal Pulley, DO; no medication changes indicated.  06/07/2021 OV (MWM) [name] A, DO; no medication changes indicated.  06/02/2021 OV (Podiatry) Lorenda Peck, MD; no medication changes  indicated.  05/09/2021 OV (MWM) Georgia Lopes, DO; Keirra will stop Ozempic. She agrees to start Trulicity 9.50 mg with no refills  Hospital visits:  06/07/2021 ED visit for Foot Pain -No medication changes indicated.  Medications: Outpatient Encounter Medications as of 06/15/2021  Medication Sig   albuterol (VENTOLIN HFA) 108 (90 Base) MCG/ACT inhaler Inhale 2 puffs into the lungs every 6 (six) hours as needed for wheezing or shortness of breath.   aspirin EC 81 MG tablet Take 81 mg by mouth daily. Swallow whole.   atorvastatin (LIPITOR) 40 MG tablet TAKE 1 TABLET BY MOUTH EVERY DAY   BD INSULIN SYRINGE U/F 31G X 5/16" 1 ML MISC USE UP TO 3 TIMES A DAY   DEXILANT 60 MG capsule Take 1 capsule (60 mg total) by mouth daily. Pt to pay out of pocket if not covered by insurance   Dulaglutide (TRULICITY) 9.32 IZ/1.2WP SOPN Inject 0.75 mg into the skin once a week.   escitalopram (LEXAPRO) 10 MG tablet Take 1 tablet (10 mg total) by mouth daily.   fluticasone (FLONASE) 50 MCG/ACT nasal spray Place 1 spray into both nostrils daily.   furosemide (LASIX) 20 MG tablet TAKE 1 TABLET (20 MG TOTAL) BY MOUTH DAILY AS NEEDED FOR FLUID OR EDEMA. (Patient taking differently: daily.)   gabapentin (NEURONTIN) 600 MG tablet TAKE 1 TABLET IN THE MORNING AND 2 TABLETS AT BEDTIME   ibuprofen (ADVIL) 200 MG tablet Take 200 mg by mouth daily.   insulin NPH-regular Human (HUMULIN 70/30) (70-30) 100 UNIT/ML injection 40 UNITS WITH BREAKFAST, AND 8 UNITS WITH SUPPER.    ketoconazole (NIZORAL) 2 % cream APPLY TWICE A DAY FOR 2 WEEKS AS NEEDED FOR RASH   olmesartan (BENICAR) 40 MG tablet TAKE 1 TABLET BY MOUTH EVERY DAY   ondansetron (ZOFRAN ODT) 4 MG disintegrating tablet Take 1 tablet (4 mg total) by mouth every 8 (eight) hours as needed for nausea or vomiting.   ONETOUCH ULTRA test strip USE 2 TIMES DAILY. AND LANCETS 2/DAY   polyethylene glycol powder ([employer] PURELAX) 17 GM/SCOOP powder DISSOLVE 1 CAPFUL IN AT LEAST 8 OUNCES WATER/JUICE AND DRINK TWICE DAILY   sucralfate (CARAFATE) 1 GM/10ML suspension Take 10 mLs (1 g total) by mouth 4 (four) times daily.   tizanidine (ZANAFLEX) 2 MG capsule TAKE 1 CAPSULE (2 MG TOTAL) BY MOUTH AT BEDTIME AS NEEDED FOR MUSCLE SPASMS.   valACYclovir (VALTREX) 500 MG tablet TAKE 1 TABLET BY MOUTH EVERY DAY   Vitamin D, Ergocalciferol, (DRISDOL) 1.25 MG (50000 UNIT) CAPS capsule Take 1 capsule (50,000 Units total) by mouth every 7 (seven) days.   No facility-administered encounter medications on file as of 06/15/2021.   Patient Questions: Have you had any problems recently with your health? -Patient states she has not had any problems with her health.  Have you had any problems with your pharmacy? -Patient states she has not had any problems with her pharmacy.  What issues or side effects are you having with your medications? -Patient states Ozempic was too expensive so she started Trulicity instead.  She states she tried to apply for patient assistance and was told she did not qualify due to over the income limit. She states she has taken Trulicity for two weeks now and so far has not had any side effects like she did with Ozempic. -Patient states she has a coupon for Trulicity and pays $015 for each prescription. -She states she doesn't check her sugars very often unless she is feeling symptomatic. She states her sugars dropped to 57 on 06/17/2021 because she skipped lunch. She states later that day it went back up to  142.  -Patient states she stopped taking Lexapro after one dose as it made her stomach feel upset.  What would you like me to pass along to Leata Mouse, CPP for him to help you with?  -Patient does not have anything for me to pass along at this time.  What can we do to take care of you better? -Patient did not have any suggestions.   Care Gaps: Medicare Annual Wellness: Completed 02/24/2021 Ophthalmology Exam: Next due on 04/07/2022 Foot Exam: Next due on 10/01/2021 Hemoglobin A1C: 7.7% on 04/13/2021 Colonoscopy: Next due on 10/13/2024 Dexa Scan: Next due on 10/25/2025 Mammogram: Overdue since 05/01/2021  Future Appointments  Date Time Provider [place]  06/23/2021 12:45 PM Lyndal Pulley, DO [facility] None  07/05/2021 10:40 AM Georgia Lopes, DO [facility] None  07/05/2021  3:20 PM Pyrtle, Lajuan Lines, MD [facility]  08/30/2021 12:30 PM [facility] CCM PHARMACIST [facility]  10/19/2021  1:30 PM [name], MD [facility]  03/13/2022  1:45 PM [facility] HEALTH COACH [facility]    Star Rating Drugs: Atorvastatin 40 mg last filled 61/53/7943 90 DS Trulicity last filled 27/61/4709 28 DS Humulin 70-30 last filled 05/24/2021 30 DS Olmesartan 40 mg last filled 05/25/2021 30 DS  April D Calhoun, [place] Pharmacist Assistant [phone]

## 2021-06-22 NOTE — Progress Notes (Signed)
Jessica [name] [place] [address] [place] Phone: [phone] Subjective:   IVilma [name], am serving as a scribe for Dr. Hulan Saas. This visit occurred during the SARS-CoV-2 public health emergency.  Safety protocols were in place, including screening questions prior to the visit, additional usage of staff PPE, and extensive cleaning of exam room while observing appropriate contact time as indicated for disinfecting solutions.   I'm seeing this patient by the request  of:  [name], MD  CC: Right ankle pain  ESP:[id number]  06/09/2021 Patient still on x-ray does not have any true fracture noted.  Patient does have mild cortical irregularity of the lateral malleolus that can be consistent with a nondisplaced fracture.  Patient was put in an Aircast today and hopefully this will be beneficial.  Discussed icing and home exercises.  Follow-up with me again in 6 to 8 weeks  Updated 06/23/2021 Jessica [name] is a 71 y.o. female coming in with complaint of leg swelling. Swelling has gone down and is feeling better. At night still not able to get comfortable.  Xray IMPRESSION: 1. Moderate calcaneal heel spur. 2. 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     Past Medical History:  Diagnosis Date   Anxiety    Arthritis    bilateral knees   Asthma    childhood   Autonomic neuropathy    diabetic   Cataract    Celiac disease    Constipation    Depression    Diabetes mellitus    type II, Hemoglobin A1C 9.9 10/05/2011   Diabetic neuropathy ([place])    Diverticulosis    Fatty liver    Gastroparesis    GERD (gastroesophageal reflux disease)    Hyperlipidemia    Hypertension    IBS (irritable bowel syndrome)    Kidney stones    Personal history of colonic polyps 03/2010   hyperplastic   Shingles 2021   Past Surgical History:  Procedure Laterality Date   APPENDECTOMY      CATARACT EXTRACTION Right 04/29/2018   CATARACT EXTRACTION Left 03/2018   COLONOSCOPY     DILATATION & CURRETTAGE/HYSTEROSCOPY WITH RESECTOCOPE N/A 03/24/2013   Procedure: DILATATION & CURETTAGE, HYSTEROSCOPY WITH RESECTION;  Surgeon: Olga Millers, MD;  Location: [facility];  Service: Gynecology;  Laterality: N/A;   HYSTEROSCOPY WITH D & C N/A 11/08/2015   Procedure: DILATATION AND CURETTAGE /HYSTEROSCOPY;  Surgeon: Olga Millers, MD;  Location: [facility];  Service: Gynecology;  Laterality: N/A;   left breast cyst removal  2000   LEFT HEART CATH AND CORONARY ANGIOGRAPHY N/A 10/31/2018   Procedure: LEFT HEART CATH AND CORONARY ANGIOGRAPHY;  Surgeon: Jettie Booze, MD;  Location: [facility];  Service: Cardiovascular;  Laterality: N/A;   TUBAL LIGATION     Social History   Socioeconomic History   Marital status: Married    Spouse name: Devar Szymanowski   Number of children: 2   Years of education: Not on file   Highest education level: Not on file  Occupational History   Occupation: Scientist, 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   Occupation: retired  Tobacco Use   Smoking status: Never   Smokeless tobacco: Never  Vaping Use   Vaping Use: Never used  Substance and Sexual Activity   Alcohol use: No   Drug use: No   Sexual activity: Yes  Birth control/protection: Post-menopausal, Surgical  Other Topics Concern   Not on file  Social History Narrative   Not on file   Social Determinants of Health   Financial Resource Strain: Low Risk    Difficulty of Paying Living Expenses: Not hard at all  Food Insecurity: No Food Insecurity   Worried About Charity fundraiser in the Last Year: Never true   Arboriculturist in the Last Year: Never true  Transportation Needs: No Transportation Needs   Lack of Transportation (Medical): No   Lack of Transportation (Non-Medical): No  Physical Activity: Sufficiently Active   Days of Exercise per Week: 7 days    Minutes of Exercise per Session: 60 min  Stress: Stress Concern Present   Feeling of Stress : To some extent  Social Connections: Moderately Integrated   Frequency of Communication with Friends and Family: More than three times a week   Frequency of Social Gatherings with Friends and Family: Once a week   Attends Religious Services: More than 4 times per year   Active Member of [employer] or Organizations: No   Attends Music therapist: Never   Marital Status: Married   Allergies  Allergen Reactions   Gluten Meal Other (See Comments)    No wheat or soy   Nsaids Nausea And Vomiting and Other (See Comments)    Cannot tolerate large doses    Soy Allergy    Wheat Bran    Family History  Problem Relation Age of Onset   Hypertension Mother    Colon cancer Sister        dx in her early 54s   Diabetes Sister    Endometrial cancer Sister    Hypertension Brother    Other Father        2 collapsed lungs   COPD Father    Diabetes Father    Heart attack Father    Heart failure Father    High blood pressure Father    Colon polyps Neg Hx    Esophageal cancer Neg Hx    Stomach cancer Neg Hx     Current Outpatient Medications (Endocrine & Metabolic):    Dulaglutide (TRULICITY) 5.09 TO/6.7TI SOPN, Inject 0.75 mg into the skin once a week.   insulin NPH-regular Human (HUMULIN 70/30) (70-30) 100 UNIT/ML injection, 40 UNITS WITH BREAKFAST, AND 8 UNITS WITH SUPPER.  Current Outpatient Medications (Cardiovascular):    atorvastatin (LIPITOR) 40 MG tablet, TAKE 1 TABLET BY MOUTH EVERY DAY   furosemide (LASIX) 20 MG tablet, TAKE 1 TABLET (20 MG TOTAL) BY MOUTH DAILY AS NEEDED FOR FLUID OR EDEMA. (Patient taking differently: daily.)   olmesartan (BENICAR) 40 MG tablet, TAKE 1 TABLET BY MOUTH EVERY DAY  Current Outpatient Medications (Respiratory):    albuterol (VENTOLIN HFA) 108 (90 Base) MCG/ACT inhaler, Inhale 2 puffs into the lungs every 6 (six) hours as needed for wheezing or  shortness of breath.   fluticasone (FLONASE) 50 MCG/ACT nasal spray, Place 1 spray into both nostrils daily.  Current Outpatient Medications (Analgesics):    aspirin EC 81 MG tablet, Take 81 mg by mouth daily. Swallow whole.   ibuprofen (ADVIL) 200 MG tablet, Take 200 mg by mouth daily.   Current Outpatient Medications (Other):    BD INSULIN SYRINGE U/F 31G X 5/16" 1 ML MISC, USE UP TO 3 TIMES A DAY   DEXILANT 60 MG capsule, Take 1 capsule (60 mg total) by mouth daily. Pt to pay out of pocket  if not covered by insurance   escitalopram (LEXAPRO) 10 MG tablet, Take 1 tablet (10 mg total) by mouth daily.   gabapentin (NEURONTIN) 600 MG tablet, TAKE 1 TABLET IN THE MORNING AND 2 TABLETS AT BEDTIME   ketoconazole (NIZORAL) 2 % cream, APPLY TWICE A DAY FOR 2 WEEKS AS NEEDED FOR RASH   ondansetron (ZOFRAN ODT) 4 MG disintegrating tablet, Take 1 tablet (4 mg total) by mouth every 8 (eight) hours as needed for nausea or vomiting.   ONETOUCH ULTRA test strip, USE 2 TIMES DAILY. AND LANCETS 2/DAY   polyethylene glycol powder ([employer] PURELAX) 17 GM/SCOOP powder, DISSOLVE 1 CAPFUL IN AT LEAST 8 OUNCES WATER/JUICE AND DRINK TWICE DAILY   sucralfate (CARAFATE) 1 GM/10ML suspension, Take 10 mLs (1 g total) by mouth 4 (four) times daily.   tizanidine (ZANAFLEX) 2 MG capsule, TAKE 1 CAPSULE (2 MG TOTAL) BY MOUTH AT BEDTIME AS NEEDED FOR MUSCLE SPASMS.   valACYclovir (VALTREX) 500 MG tablet, TAKE 1 TABLET BY MOUTH EVERY DAY   Vitamin D, Ergocalciferol, (DRISDOL) 1.25 MG (50000 UNIT) CAPS capsule, Take 1 capsule (50,000 Units total) by mouth every 7 (seven) days.   Reviewed prior external information including notes and imaging from  primary care provider As well as notes that were available from care everywhere and other healthcare systems.  Past medical history, social, surgical and family history all reviewed in electronic medical record.  No pertanent information unless stated regarding to the chief  complaint.   Review of Systems:  No headache, visual changes, nausea, vomiting, diarrhea, constipation, dizziness, abdominal pain, skin rash, fevers, chills, night sweats, weight loss, swollen lymph nodes, body aches,  chest pain, shortness of breath, mood changes. POSITIVE muscle aches, joint swelling  Objective  Pulse 77, height 5' 5"  (1.651 m), weight 215 lb (97.5 kg), SpO2 96 %.   General: No apparent distress alert and oriented x3 mood and affect normal, dressed appropriately.  HEENT: Pupils equal, extraocular movements intact  Respiratory: Patient's speak in full sentences and does not appear short of breath  Gait severely antalgic favoring the right leg at the moment.  Patient continues to have swelling noted.  Patient is severely tender to palpation over the anterior ankle mortise and the lateral ankle mortise.  Patient also has pain noted over the lateral malleolus.  Limited muscular skeletal ultrasound was performed and interpreted by Hulan Saas, M  Limited ultrasound of patient's ankle shows a trace effusion noted of the ankle joint.  No cortical irregularity noted of the talar dome.  Patient does have a cortical irregularity noted of the lateral malleolus with continued soft tissue swelling, no callus formation noted.    Impression and Recommendations:     The above documentation has been reviewed and is accurate and complete Lyndal Pulley, DO

## 2021-06-23 ENCOUNTER — Other Ambulatory Visit: Payer: Self-pay

## 2021-06-23 ENCOUNTER — Ambulatory Visit: Payer: Self-pay

## 2021-06-23 ENCOUNTER — Ambulatory Visit (INDEPENDENT_AMBULATORY_CARE_PROVIDER_SITE_OTHER): Payer: Medicare Other | Admitting: Family Medicine

## 2021-06-23 ENCOUNTER — Encounter: Payer: Self-pay | Admitting: Family Medicine

## 2021-06-23 VITALS — HR 77 | Ht 65.0 in | Wt 215.0 lb

## 2021-06-23 DIAGNOSIS — R2241 Localized swelling, mass and lump, right lower limb: Secondary | ICD-10-CM

## 2021-06-23 NOTE — Assessment & Plan Note (Signed)
Continued pain and swelling out of the ordinary, need to rule out occult fracture. Xray and doppler normal but ultrasound shows possible cortical irregularity noted of the lateral malleolus but difficult to tell.  Patient given lace up brace to try to give her more stability because patient felt the Aircast was more uncomfortable.  Patient can weight-bear as tolerated, I do feel because the swelling and pain is out of proportion and continues to give difficulty without a proper diagnosis MRI is necessary at this time in the ankle.  Depending on findings to rule out such as OCD we will discuss different treatment options.

## 2021-06-23 NOTE — Patient Instructions (Addendum)
[place] [phone] Call Today  When we receive your results we will contact you. See you again in 4 weeks

## 2021-06-28 ENCOUNTER — Ambulatory Visit: Payer: Medicare Other | Admitting: Family Medicine

## 2021-06-30 ENCOUNTER — Other Ambulatory Visit: Payer: Self-pay

## 2021-06-30 ENCOUNTER — Ambulatory Visit
Admission: RE | Admit: 2021-06-30 | Discharge: 2021-06-30 | Disposition: A | Discharge status: Home / Self Care | Payer: Medicare Other | Source: Ambulatory Visit | Attending: Family Medicine | Admitting: Family Medicine

## 2021-06-30 DIAGNOSIS — R2241 Localized swelling, mass and lump, right lower limb: Secondary | ICD-10-CM

## 2021-07-01 ENCOUNTER — Encounter: Payer: Self-pay | Admitting: Family Medicine

## 2021-07-01 ENCOUNTER — Encounter: Payer: Self-pay | Admitting: Podiatry

## 2021-07-01 ENCOUNTER — Encounter (INDEPENDENT_AMBULATORY_CARE_PROVIDER_SITE_OTHER): Payer: Self-pay | Admitting: Bariatrics

## 2021-07-03 ENCOUNTER — Other Ambulatory Visit: Payer: Self-pay | Admitting: Physician Assistant

## 2021-07-03 ENCOUNTER — Other Ambulatory Visit (INDEPENDENT_AMBULATORY_CARE_PROVIDER_SITE_OTHER): Payer: Self-pay | Admitting: Bariatrics

## 2021-07-03 DIAGNOSIS — E1169 Type 2 diabetes mellitus with other specified complication: Secondary | ICD-10-CM

## 2021-07-03 DIAGNOSIS — E669 Obesity, unspecified: Secondary | ICD-10-CM

## 2021-07-04 ENCOUNTER — Encounter (INDEPENDENT_AMBULATORY_CARE_PROVIDER_SITE_OTHER): Payer: Self-pay | Admitting: Bariatrics

## 2021-07-04 NOTE — Telephone Encounter (Signed)
Dr.Brown 

## 2021-07-05 ENCOUNTER — Ambulatory Visit (INDEPENDENT_AMBULATORY_CARE_PROVIDER_SITE_OTHER): Payer: Medicare Other | Admitting: Podiatry

## 2021-07-05 ENCOUNTER — Other Ambulatory Visit: Payer: Self-pay

## 2021-07-05 ENCOUNTER — Encounter: Payer: Self-pay | Admitting: Internal Medicine

## 2021-07-05 ENCOUNTER — Encounter: Payer: Self-pay | Admitting: Podiatry

## 2021-07-05 ENCOUNTER — Ambulatory Visit (INDEPENDENT_AMBULATORY_CARE_PROVIDER_SITE_OTHER): Payer: Medicare Other | Admitting: Bariatrics

## 2021-07-05 ENCOUNTER — Ambulatory Visit (INDEPENDENT_AMBULATORY_CARE_PROVIDER_SITE_OTHER): Payer: Medicare Other | Admitting: Internal Medicine

## 2021-07-05 VITALS — BP 114/60 | HR 65 | Ht 65.0 in | Wt 215.0 lb

## 2021-07-05 DIAGNOSIS — M7751 Other enthesopathy of right foot: Secondary | ICD-10-CM | POA: Diagnosis not present

## 2021-07-05 DIAGNOSIS — K900 Celiac disease: Secondary | ICD-10-CM

## 2021-07-05 DIAGNOSIS — Z659 Problem related to unspecified psychosocial circumstances: Secondary | ICD-10-CM | POA: Insufficient documentation

## 2021-07-05 DIAGNOSIS — K219 Gastro-esophageal reflux disease without esophagitis: Secondary | ICD-10-CM

## 2021-07-05 DIAGNOSIS — M7671 Peroneal tendinitis, right leg: Secondary | ICD-10-CM

## 2021-07-05 DIAGNOSIS — R110 Nausea: Secondary | ICD-10-CM | POA: Diagnosis not present

## 2021-07-05 DIAGNOSIS — K5909 Other constipation: Secondary | ICD-10-CM

## 2021-07-05 DIAGNOSIS — K30 Functional dyspepsia: Secondary | ICD-10-CM

## 2021-07-05 MED ORDER — TRIAMCINOLONE ACETONIDE 40 MG/ML IJ SUSP
20.0000 mg | Freq: Once | INTRAMUSCULAR | Status: AC
  Administered 2021-07-05: 20 mg

## 2021-07-05 NOTE — Patient Instructions (Signed)
You have been scheduled for an endoscopy. Please follow written instructions given to you at your visit today. ?If you use inhalers (even only as needed), please bring them with you on the day of your procedure. ? ?We have sent the following medications to your pharmacy for you to pick up at your convenience: ?Dexilant 60 mg daily ? ?Please purchase the following medications over the counter and take as directed: ?Pepcid twice daily as needed for breakthrough reflux ? ?Continue Miralax ? ?If you are age 71 or older, your body mass index should be between 23-30. Your Body mass index is 35.78 kg/m?Marland Kitchen If this is out of the aforementioned range listed, please consider follow up with your Primary Care Provider. ? ?If you are age 32 or younger, your body mass index should be between 19-25. Your Body mass index is 35.78 kg/m?Marland Kitchen If this is out of the aformentioned range listed, please consider follow up with your Primary Care Provider.  ? ?________________________________________________________ ? ?The [facility] providers would like to encourage you to use [facility] to communicate with providers for non-urgent requests or questions.  Due to long hold times on the telephone, sending your provider a message by [facility] may be a faster and more efficient way to get a response.  Please allow 48 business hours for a response.  Please remember that this is for non-urgent requests.  ?_______________________________________________________ ?Due to recent changes in healthcare laws, you may see the results of your imaging and laboratory studies on MyChart before your provider has had a chance to review them.  We understand that in some cases there may be results that are confusing or concerning to you. Not all laboratory results come back in the same time frame and the provider may be waiting for multiple results in order to interpret others.  Please give [place] 48 hours in order for your provider to thoroughly review all the results before  contacting the office for clarification of your results.  ? ?

## 2021-07-05 NOTE — Progress Notes (Signed)
She presents today after having MRI and ultrasound done ordered by Dr. Hulan Saas who had initially stated that she had a fracture in her foot and then went on to say that she has tendinitis.  She states that her foot is very sore and that she needs some help with it.  She states it feels just like a broken bone. ? ?Objective: Vital signs are stable alert and oriented x3 pulses are palpable.  There is no erythema edema cellulitis drainage or the exception along the peroneal tendons which are exquisitely tender on palpation.  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 ? ?Assessment: Capsulitis and peroneal tendinosis. ? ?Plan: I injected these areas today with Kenalog and local anesthetic.  She tolerated procedure well with sterile Betadine skin prep.  And I will follow-up with her in 1 month. ?

## 2021-07-06 ENCOUNTER — Encounter: Payer: Self-pay | Admitting: Internal Medicine

## 2021-07-06 ENCOUNTER — Other Ambulatory Visit (INDEPENDENT_AMBULATORY_CARE_PROVIDER_SITE_OTHER): Payer: Self-pay | Admitting: Bariatrics

## 2021-07-06 DIAGNOSIS — E559 Vitamin D deficiency, unspecified: Secondary | ICD-10-CM

## 2021-07-06 NOTE — Progress Notes (Signed)
? ?Subjective:  ? ? Patient ID: Jessica [name], female    DOB: Apr 03, 1951, 71 y.o.   MRN: [id number] ? ?HPI ?Jessica [name] is a 71 year old female with a history of GERD, celiac disease maintained on GFD, family history of colon cancer, chronic constipation, diabetes, hypertension who is here for follow-up.  She is here alone today. ? ?She had previously called with upper abdominal pain and nausea.  She reports that this was associated with initiation of Ozempic which she took for about 8 weeks.  This has subsequently been changed to Trulicity which she has taken for about 1 month.  Her upper abdominal pain is significantly better and in fact resolved.  She still having some nausea but no vomiting.  She has a hard time eating and drinking foods that previously did not bother her.  This includes drinking water.  She has used some Zofran which helps with nausea.  She has had some breakthrough indigestion despite generic Dexilant 60 mg daily.  She is not having dysphagia or odynophagia.  Bowel movements have been regular without blood or melena on MiraLAX.  She is has continued her strict gluten-free diet. ? ?Her sister unfortunately died of a rare uterine cancer.  She has had her regular screenings with GYN. ? ?She is following with Dr. Owens Shark to help with her weight loss.  In 2018 her weight was 249 pounds and today it is 215 pounds ? ? ?Review of Systems ?As per HPI, otherwise negative ? ?Current Medications, Allergies, Past Medical History, Past Surgical History, Family History and Social History were reviewed in [employer] record. ? ?   ?Objective:  ? Physical Exam ?BP 114/60   Pulse 65   Ht 5' 5"  (1.651 m)   Wt 215 lb (97.5 kg)   BMI 35.78 kg/m?  ?Gen: awake, alert, NAD ?HEENT: anicteric ?Neuro: nonfocal ? ?CBC ?   ?Component Value Date/Time  ? WBC 3.4 (L) 06/09/2021 1010  ? RBC 4.42 06/09/2021 1010  ? HGB 14.7 06/09/2021 1010  ? HGB 13.4 10/28/2018 1218  ? HCT 43.7 06/09/2021 1010  ?  HCT 42.6 09/07/2020 1231  ? PLT 214.0 06/09/2021 1010  ? PLT 260 10/28/2018 1218  ? MCV 99.0 06/09/2021 1010  ? MCV 97 10/28/2018 1218  ? MCH 32.1 10/28/2018 1218  ? MCH 32.8 01/07/2016 1400  ? MCHC 33.7 06/09/2021 1010  ? RDW 12.6 06/09/2021 1010  ? RDW 12.0 10/28/2018 1218  ? LYMPHSABS 1.6 06/09/2021 1010  ? MONOABS 0.3 06/09/2021 1010  ? EOSABS 0.2 06/09/2021 1010  ? BASOSABS 0.0 06/09/2021 1010  ? ?CMP  ?   ?Component Value Date/Time  ? NA 138 06/09/2021 1010  ? NA 141 03/11/2020 1038  ? K 4.6 06/09/2021 1010  ? CL 102 06/09/2021 1010  ? CO2 33 (H) 06/09/2021 1010  ? GLUCOSE 154 (H) 06/09/2021 1010  ? BUN 23 06/09/2021 1010  ? BUN 19 03/11/2020 1038  ? CREATININE 0.95 06/09/2021 1010  ? CREATININE 0.83 04/04/2012 0832  ? CALCIUM 9.7 06/09/2021 1010  ? PROT 6.9 06/09/2021 1010  ? PROT 6.9 03/11/2020 1038  ? ALBUMIN 3.9 06/09/2021 1010  ? ALBUMIN 4.0 03/11/2020 1038  ? AST 16 06/09/2021 1010  ? ALT 14 06/09/2021 1010  ? ALKPHOS 69 06/09/2021 1010  ? BILITOT 0.7 06/09/2021 1010  ? BILITOT 0.4 03/11/2020 1038  ? GFRNONAA 61 03/11/2020 1038  ? GFRAA 70 03/11/2020 1038  ? ?   ?Assessment & Plan:  ?71 year old female  with a history of GERD, celiac disease maintained on GFD, family history of colon cancer, chronic constipation, diabetes, hypertension who is here for follow-up.   ? ?Nausea/change in appetite/now improved upper abdominal pain --Ozempic and Trulicity likely explain this symptom however given the symptoms along with her history of GERD and celiac disease I recommended upper endoscopy.  We reviewed the risk, benefits and alternatives and she is agreeable and wishes to proceed ?--EGD in the Box ?--Continue ondansetron 4 mg every 6 hours as needed nausea ? ?2.  GERD --some breakthrough indigestion despite generic Dexilant 60 mg daily. ?--EGD as above ?--Continue Dexilant 60 mg daily ?--Add famotidine 20 mg up to twice daily as needed for breakthrough heartburn and indigestion ? ?3.  Celiac disease --on strict  gluten-free diet.  Previously under good control and in remission. ?--Continue gluten-free diet ?--EGD as per #1 ? ?4.  Chronic constipation --doing well and continuing on MiraLAX ? ?5.  Family history of colon cancer --distal hyperplastic polyps only at last exam in 2021, repeat at 5-year interval which would be 2026 ? ?30 minutes total spent today including patient facing time, coordination of care, reviewing medical history/procedures/pertinent radiology studies, and documentation of the encounter. ? ? ?

## 2021-07-13 ENCOUNTER — Other Ambulatory Visit: Payer: Self-pay | Admitting: Family Medicine

## 2021-07-19 ENCOUNTER — Encounter (INDEPENDENT_AMBULATORY_CARE_PROVIDER_SITE_OTHER): Payer: Self-pay | Admitting: Bariatrics

## 2021-07-19 ENCOUNTER — Ambulatory Visit (INDEPENDENT_AMBULATORY_CARE_PROVIDER_SITE_OTHER): Payer: Medicare Other | Admitting: Bariatrics

## 2021-07-19 ENCOUNTER — Other Ambulatory Visit: Payer: Self-pay

## 2021-07-19 VITALS — BP 146/86 | HR 77 | Temp 98.2°F | Ht 65.0 in | Wt 207.0 lb

## 2021-07-19 DIAGNOSIS — E1169 Type 2 diabetes mellitus with other specified complication: Secondary | ICD-10-CM | POA: Diagnosis not present

## 2021-07-19 DIAGNOSIS — E669 Obesity, unspecified: Secondary | ICD-10-CM

## 2021-07-19 DIAGNOSIS — I10 Essential (primary) hypertension: Secondary | ICD-10-CM | POA: Diagnosis not present

## 2021-07-19 DIAGNOSIS — Z6834 Body mass index (BMI) 34.0-34.9, adult: Secondary | ICD-10-CM

## 2021-07-19 DIAGNOSIS — Z7985 Long-term (current) use of injectable non-insulin antidiabetic drugs: Secondary | ICD-10-CM

## 2021-07-19 MED ORDER — TRULICITY 0.75 MG/0.5ML ~~LOC~~ SOAJ: 0.7500 mg | SUBCUTANEOUS | 0 refills | Status: DC

## 2021-07-21 ENCOUNTER — Ambulatory Visit: Payer: Medicare Other | Admitting: Family Medicine

## 2021-07-21 NOTE — Progress Notes (Signed)
? ? ? ?Chief Complaint:  ? ?OBESITY ?Jessica [name] is here to discuss her progress with her obesity treatment plan along with follow-up of her obesity related diagnoses. Jessica [name] is on the Category 3 Plan and states she is following her eating plan approximately 95% of the time. Jessica [name] states she is doing 0 minutes 0 times per week. ? ?Today's visit was #: 28 ?Starting weight: 246 lbs ?Starting date: 03/12/2019 ?Today's weight: 207 lbs ?Today's date: 07/19/2021 ?Total lbs lost to date: 39 lbs ?Total lbs lost since last in-office visit: 2 lbs ? ?Interim History: Jessica [name] is down another 2 lbs. ? ?Subjective:  ? ?1. Type 2 diabetes mellitus with obesity ([place]) ?Jessica [name] is currently taking Trulicity. She is tolerating Trulicity well.  ? ?2. Essential hypertension ?Jessica [name]'s blood pressure is reasonably well controlled. Her last blood pressure was 146/84. ? ?Assessment/Plan:  ? ?1. Type 2 diabetes mellitus with obesity ([place]) ?We will refill Trulicity 7.93 mg for 1 month with no refills. Good blood sugar control is important to decrease the likelihood of diabetic complications such as nephropathy, neuropathy, limb loss, blindness, coronary artery disease, and death. Intensive lifestyle modification including diet, exercise and weight loss are the first line of treatment for diabetes.  ? ?- Dulaglutide (TRULICITY) 9.03 ES/9.2ZR SOPN; Inject 0.75 mg into the skin once a week.  Dispense: 2 mL; Refill: 0 ? ?2. Essential hypertension ?Jessica [name] will continue her medications. She is working on healthy weight loss and exercise to improve blood pressure control. We will watch for signs of hypotension as she continues her lifestyle modifications. ? ?3. Obesity with current BMI of 34.5 ?Jessica [name] is currently in the action stage of change. As such, her goal is to continue with weight loss efforts. She has agreed to the Category 3 Plan.  ? ?Jessica [name] will continue meal planning and she will continue to adhere to the plan 80-90%.  ? ?Exercise goals:   Jessica [name] is unable to do her Cubii that much.  ? ?Behavioral modification strategies: increasing lean protein intake, decreasing simple carbohydrates, increasing vegetables, increasing water intake, decreasing eating out, no skipping meals, meal planning and cooking strategies, keeping healthy foods in the home, and planning for success. ? ?Jessica [name] has agreed to follow-up with our clinic in 4 weeks. She was informed of the importance of frequent follow-up visits to maximize her success with intensive lifestyle modifications for her multiple health conditions.  ? ?Objective:  ? ?Blood pressure (!) 146/86, pulse 77, temperature 98.2 ?F (36.8 ?C), height 5' 5"  (1.651 m), weight 207 lb (93.9 kg), SpO2 99 %. ?Body mass index is 34.45 kg/m?. ? ?General: Cooperative, alert, well developed, in no acute distress. ?HEENT: Conjunctivae and lids unremarkable. ?Cardiovascular: Regular rhythm.  ?Lungs: Normal work of breathing. ?Neurologic: No focal deficits.  ? ?Lab Results  ?Component Value Date  ? CREATININE 0.95 06/09/2021  ? BUN 23 06/09/2021  ? NA 138 06/09/2021  ? K 4.6 06/09/2021  ? CL 102 06/09/2021  ? CO2 33 (H) 06/09/2021  ? ?Lab Results  ?Component Value Date  ? ALT 14 06/09/2021  ? AST 16 06/09/2021  ? ALKPHOS 69 06/09/2021  ? BILITOT 0.7 06/09/2021  ? ?Lab Results  ?Component Value Date  ? HGBA1C 7.7 (A) 04/13/2021  ? HGBA1C 8.2 (A) 01/12/2021  ? HGBA1C 8.1 (A) 08/12/2020  ? HGBA1C 7.8 (A) 05/14/2020  ? HGBA1C 7.1 (A) 12/31/2019  ? ?No results found for: INSULIN ?Lab Results  ?Component Value Date  ? TSH 0.91 04/13/2021  ? ?Lab Results  ?  Component Value Date  ? CHOL 135 04/13/2021  ? HDL 59.70 04/13/2021  ? [place] 65 04/13/2021  ? LDLDIRECT 143.2 11/23/2006  ? TRIG 50.0 04/13/2021  ? CHOLHDL 2 04/13/2021  ? ?Lab Results  ?Component Value Date  ? VD25OH 44.70 04/13/2021  ? VD25OH 85.0 09/07/2020  ? VD25OH 61.6 03/11/2020  ? ?Lab Results  ?Component Value Date  ? WBC 3.4 (L) 06/09/2021  ? HGB 14.7 06/09/2021  ? HCT 43.7  06/09/2021  ? MCV 99.0 06/09/2021  ? PLT 214.0 06/09/2021  ? ?Lab Results  ?Component Value Date  ? IRON 113 09/07/2020  ? TIBC 276 09/07/2020  ? FERRITIN 149 09/07/2020  ? ?Attestation Statements:  ? ?Reviewed by clinician on day of visit: allergies, medications, problem list, medical history, surgical history, family history, social history, and previous encounter notes. ? ?I, Lizbeth Bark, RMA, am acting as transcriptionist for [employer], DO. ? ?I have reviewed the above documentation for accuracy and completeness, and I agree with the above. [name], DO ? ?

## 2021-07-22 ENCOUNTER — Other Ambulatory Visit: Payer: Self-pay | Admitting: Obstetrics and Gynecology

## 2021-07-22 ENCOUNTER — Ambulatory Visit
Admission: RE | Admit: 2021-07-22 | Discharge: 2021-07-22 | Disposition: A | Discharge status: Home / Self Care | Payer: Medicare Other | Source: Ambulatory Visit | Attending: Obstetrics and Gynecology | Admitting: Obstetrics and Gynecology

## 2021-07-22 DIAGNOSIS — Z1231 Encounter for screening mammogram for malignant neoplasm of breast: Secondary | ICD-10-CM

## 2021-07-25 ENCOUNTER — Encounter (INDEPENDENT_AMBULATORY_CARE_PROVIDER_SITE_OTHER): Payer: Self-pay | Admitting: Bariatrics

## 2021-07-28 ENCOUNTER — Ambulatory Visit (INDEPENDENT_AMBULATORY_CARE_PROVIDER_SITE_OTHER): Payer: Medicare Other | Admitting: Podiatry

## 2021-07-28 ENCOUNTER — Encounter: Payer: Self-pay | Admitting: Podiatry

## 2021-07-28 DIAGNOSIS — M7751 Other enthesopathy of right foot: Secondary | ICD-10-CM

## 2021-07-28 DIAGNOSIS — M7671 Peroneal tendinitis, right leg: Secondary | ICD-10-CM

## 2021-07-28 MED ORDER — TRIAMCINOLONE ACETONIDE 40 MG/ML IJ SUSP
20.0000 mg | Freq: Once | INTRAMUSCULAR | Status: AC
  Administered 2021-07-28: 20 mg

## 2021-07-28 NOTE — Progress Notes (Signed)
She presents today for follow-up of capsulitis and tendinitis of the right foot she is a patient of Dr. Hulan Saas s who had performed a MRI and an ultrasound.  Last time she was then we helped her with her peroneal tendinitis by 75 to 80% with an injection.  She states that she still has some tenderness and here she points to the anterior ankle and the sinus tarsi area of the right foot. ? ?Objective: Vital signs are stable alert and oriented x3.  She has pain on palpation and inversion of the aunts the version of the subtalar joint.  She also has pain on dorsiflexion plantarflexion of the ankle joint. ? ?Assessment: Capsulitis of the subtalar and ankle joints right. ? ?Plan: I injected both of these areas today with 10 mg Kenalog for milligrams Marcaine point of maximal tenderness.  Tolerated procedure well without complications. ?

## 2021-08-11 ENCOUNTER — Other Ambulatory Visit: Payer: Self-pay | Admitting: Physician Assistant

## 2021-08-13 ENCOUNTER — Other Ambulatory Visit: Payer: Self-pay | Admitting: Internal Medicine

## 2021-08-13 ENCOUNTER — Other Ambulatory Visit: Payer: Self-pay | Admitting: Family Medicine

## 2021-08-15 ENCOUNTER — Ambulatory Visit (AMBULATORY_SURGERY_CENTER): Payer: Medicare Other | Admitting: Internal Medicine

## 2021-08-15 ENCOUNTER — Encounter: Payer: Self-pay | Admitting: Internal Medicine

## 2021-08-15 VITALS — BP 146/61 | HR 35 | Temp 97.5°F | Resp 10 | Ht 65.0 in | Wt 215.0 lb

## 2021-08-15 DIAGNOSIS — K449 Diaphragmatic hernia without obstruction or gangrene: Secondary | ICD-10-CM

## 2021-08-15 DIAGNOSIS — K900 Celiac disease: Secondary | ICD-10-CM

## 2021-08-15 DIAGNOSIS — K219 Gastro-esophageal reflux disease without esophagitis: Secondary | ICD-10-CM | POA: Diagnosis present

## 2021-08-15 DIAGNOSIS — R110 Nausea: Secondary | ICD-10-CM

## 2021-08-15 DIAGNOSIS — K30 Functional dyspepsia: Secondary | ICD-10-CM

## 2021-08-15 MED ORDER — SODIUM CHLORIDE 0.9 % IV SOLN: 500.0000 mL | Freq: Once | INTRAVENOUS | Status: DC

## 2021-08-15 NOTE — Progress Notes (Signed)
? ?GASTROENTEROLOGY PROCEDURE H&P NOTE  ? ?Primary Care Physician: ?[name], MD ? ? ? ?Reason for Procedure:  GERD, nausea, celiac disease ? ?Plan:    EGD ? ?Patient is appropriate for endoscopic procedure(s) in the ambulatory ([facility]) setting. ? ?The nature of the procedure, as well as the risks, benefits, and alternatives were carefully and thoroughly reviewed with the patient. Ample time for discussion and questions allowed. The patient understood, was satisfied, and agreed to proceed.  ? ? ? ?HPI: ?Jessica [name] is a 71 y.o. female who presents for EGD.  Medical history as below.  No recent chest pain or shortness of breath.  No abdominal pain today. ? ?Past Medical History:  ?Diagnosis Date  ? Anxiety   ? 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 Asthma   ? childhood  ? Autonomic neuropathy   ? diabetic  ? Cataract   ? Celiac disease   ? Constipation   ? Depression   ? Diabetes mellitus   ? type II, Hemoglobin A1C 9.9 10/05/2011  ? Diabetic neuropathy ([place])   ? Diverticulosis   ? Fatty liver   ? Gastroparesis   ? GERD (gastroesophageal reflux disease)   ? Hyperlipidemia   ? Hypertension   ? IBS (irritable bowel syndrome)   ? Kidney stones   ? Personal history of colonic polyps 03/2010  ? hyperplastic  ? Shingles 2021  ? ? ?Past Surgical History:  ?Procedure Laterality Date  ? APPENDECTOMY    ? CATARACT EXTRACTION Right 04/29/2018  ? CATARACT EXTRACTION Left 03/2018  ? COLONOSCOPY    ? DILATATION & CURRETTAGE/HYSTEROSCOPY WITH RESECTOCOPE N/A 03/24/2013  ? Procedure: DILATATION & CURETTAGE, HYSTEROSCOPY WITH RESECTION;  Surgeon: Olga Millers, MD;  Location: [facility];  Service: Gynecology;  Laterality: N/A;  ? HYSTEROSCOPY WITH D & C N/A 11/08/2015  ? Procedure: DILATATION AND CURETTAGE /HYSTEROSCOPY;  Surgeon: Olga Millers, MD;  Location: [facility];  Service: Gynecology;  Laterality: N/A;  ? left breast cyst removal  2000  ? LEFT HEART CATH AND CORONARY ANGIOGRAPHY N/A 10/31/2018  ? Procedure: LEFT HEART CATH AND  CORONARY ANGIOGRAPHY;  Surgeon: Jettie Booze, MD;  Location: [facility];  Service: Cardiovascular;  Laterality: N/A;  ? TUBAL LIGATION    ? ? ?Prior to Admission medications   ?Medication Sig Start Date End Date Taking? Authorizing Provider  ?aspirin EC 81 MG tablet Take 81 mg by mouth daily. Swallow whole.   Yes [provider]  ?atorvastatin (LIPITOR) 40 MG tablet TAKE 1 TABLET BY MOUTH EVERY DAY 03/29/21  Yes [name], MD  ?BD INSULIN SYRINGE U/F 31G X 5/16" 1 ML MISC USE UP TO 3 TIMES A DAY 05/25/21  Yes Renato Shin, MD  ?Dulaglutide (TRULICITY) 8.18 EX/9.3ZJ SOPN Inject 0.75 mg into the skin once a week. 07/19/21  Yes [name] A, DO  ?fluticasone (FLONASE) 50 MCG/ACT nasal spray Place 1 spray into both nostrils daily. 08/25/20  Yes [name], MD  ?furosemide (LASIX) 20 MG tablet TAKE 1 TABLET (20 MG TOTAL) BY MOUTH DAILY AS NEEDED FOR FLUID OR EDEMA. ?Patient taking differently: daily. 08/12/20  Yes [name], MD  ?gabapentin (NEURONTIN) 600 MG tablet TAKE 1 TABLET IN THE MORNING AND 2 TABLETS AT BEDTIME 03/02/21  Yes Hyatt, Max T, DPM  ?ibuprofen (ADVIL) 200 MG tablet Take 200 mg by mouth daily.   Yes [provider]  ?ketoconazole (NIZORAL) 2 % cream APPLY TWICE A  DAY FOR 2 WEEKS AS NEEDED FOR RASH 04/13/20  Yes [name], MD  ?olmesartan (BENICAR) 40 MG tablet TAKE 1 TABLET BY MOUTH EVERY DAY 07/13/21  Yes [name], MD  ?[facility] ULTRA test strip USE 2 TIMES DAILY. AND LANCETS 2/DAY 03/26/19  Yes Renato Shin, MD  ?polyethylene glycol powder ([facility] PURELAX) 17 GM/SCOOP powder DISSOLVE 1 CAPFUL IN AT LEAST 8 OUNCES WATER/JUICE AND DRINK TWICE DAILY 08/11/21  Yes Esterwood, Amy S, PA-C  ?valACYclovir (VALTREX) 500 MG tablet TAKE 1 TABLET BY MOUTH EVERY DAY 10/26/20  Yes [name], MD  ?albuterol (VENTOLIN HFA) 108 (90 Base) MCG/ACT inhaler Inhale 2 puffs into the lungs every 6 (six) hours as needed for wheezing or shortness of breath. 10/13/19    [name], MD  ?dexlansoprazole (DEXILANT) 60 MG capsule TAKE 1 CAPSULE BY MOUTH EVERY DAY 08/15/21   Jernard Reiber, Lajuan Lines, MD  ?insulin NPH-regular Human (HUMULIN 70/30) (70-30) 100 UNIT/ML injection 40 UNITS WITH BREAKFAST, AND 8 UNITS WITH SUPPER. 12/28/20   Renato Shin, MD  ?ondansetron (ZOFRAN ODT) 4 MG disintegrating tablet Take 1 tablet (4 mg total) by mouth every 8 (eight) hours as needed for nausea or vomiting. 05/09/21   [name] A, DO  ?sucralfate (CARAFATE) 1 GM/10ML suspension Take 10 mLs (1 g total) by mouth 4 (four) times daily. 05/21/20   Madalin Hughart, Lajuan Lines, MD  ?Vitamin D, Ergocalciferol, (DRISDOL) 1.25 MG (50000 UNIT) CAPS capsule Take 1 capsule (50,000 Units total) by mouth every 7 (seven) days. 05/09/21   Georgia Lopes, DO  ? ? ?Current Outpatient Medications  ?Medication Sig Dispense Refill  ? aspirin EC 81 MG tablet Take 81 mg by mouth daily. Swallow whole.    ? atorvastatin (LIPITOR) 40 MG tablet TAKE 1 TABLET BY MOUTH EVERY DAY 90 tablet 3  ? BD INSULIN SYRINGE U/F 31G X 5/16" 1 ML MISC USE UP TO 3 TIMES A DAY 100 each 5  ? Dulaglutide (TRULICITY) 9.62 EZ/6.6QH SOPN Inject 0.75 mg into the skin once a week. 2 mL 0  ? fluticasone (FLONASE) 50 MCG/ACT nasal spray Place 1 spray into both nostrils daily. 16 g 6  ? furosemide (LASIX) 20 MG tablet TAKE 1 TABLET (20 MG TOTAL) BY MOUTH DAILY AS NEEDED FOR FLUID OR EDEMA. (Patient taking differently: daily.) 90 tablet 3  ? gabapentin (NEURONTIN) 600 MG tablet TAKE 1 TABLET IN THE MORNING AND 2 TABLETS AT BEDTIME 270 tablet 2  ? ibuprofen (ADVIL) 200 MG tablet Take 200 mg by mouth daily.    ? ketoconazole (NIZORAL) 2 % cream APPLY TWICE A DAY FOR 2 WEEKS AS NEEDED FOR RASH 30 g 2  ? olmesartan (BENICAR) 40 MG tablet TAKE 1 TABLET BY MOUTH EVERY DAY 90 tablet 3  ? ONETOUCH ULTRA test strip USE 2 TIMES DAILY. AND LANCETS 2/DAY 200 strip 3  ? polyethylene glycol powder ([employer] PURELAX) 17 GM/SCOOP powder DISSOLVE 1 CAPFUL IN AT LEAST 8 OUNCES WATER/JUICE AND  DRINK TWICE DAILY 510 g 1  ? valACYclovir (VALTREX) 500 MG tablet TAKE 1 TABLET BY MOUTH EVERY DAY 90 tablet 3  ? albuterol (VENTOLIN HFA) 108 (90 Base) MCG/ACT inhaler Inhale 2 puffs into the lungs every 6 (six) hours as needed for wheezing or shortness of breath. 8 g 3  ? dexlansoprazole (DEXILANT) 60 MG capsule TAKE 1 CAPSULE BY MOUTH EVERY DAY 90 capsule 3  ? insulin NPH-regular Human (HUMULIN 70/30) (70-30) 100 UNIT/ML injection 40 UNITS WITH BREAKFAST,  AND 8 UNITS WITH SUPPER. 20 mL 8  ? ondansetron (ZOFRAN ODT) 4 MG disintegrating tablet Take 1 tablet (4 mg total) by mouth every 8 (eight) hours as needed for nausea or vomiting. 20 tablet 0  ? sucralfate (CARAFATE) 1 GM/10ML suspension Take 10 mLs (1 g total) by mouth 4 (four) times daily. 420 mL 1  ? Vitamin D, Ergocalciferol, (DRISDOL) 1.25 MG (50000 UNIT) CAPS capsule Take 1 capsule (50,000 Units total) by mouth every 7 (seven) days. 4 capsule 0  ? ?Current Facility-Administered Medications  ?Medication Dose Route Frequency Provider Last Rate Last Admin  ? 0.9 %  sodium chloride infusion  500 mL Intravenous Once Deleah Tison, Lajuan Lines, MD      ? ? ?Allergies as of 08/15/2021 - Review Complete 08/15/2021  ?Allergen Reaction Noted  ? Gluten meal Other (See Comments) 01/07/2016  ? Nsaids Nausea And Vomiting and Other (See Comments)   ? Soy allergy  03/12/2019  ? Wheat bran  03/12/2019  ? ? ?Family History  ?Problem Relation Age of Onset  ? Hypertension Mother   ? Colon cancer Sister   ?     dx in her early 62s  ? Diabetes Sister   ? Endometrial cancer Sister   ? Hypertension Brother   ? Other Father   ?     2 collapsed lungs  ? COPD Father   ? Diabetes Father   ? Heart attack Father   ? Heart failure Father   ? High blood pressure Father   ? Colon polyps Neg Hx   ? Esophageal cancer Neg Hx   ? Stomach cancer Neg Hx   ? ? ?Social History  ? ?Socioeconomic History  ? Marital status: Married  ?  Spouse name: Devar Bayne  ? Number of children: 2  ? Years of education: Not  on file  ? Highest education level: Not on file  ?Occupational History  ? Occupation: Scientist, water quality at [employer]  ?  Employer: RETIRED  ?  Comment: parttime  ? Occupation: retired  ?Tobacco Use  ?

## 2021-08-15 NOTE — Progress Notes (Signed)
VS CW ? ?Pt's states no medical or surgical changes since previsit or office visit. ? ?

## 2021-08-15 NOTE — Op Note (Signed)
[facility] ?Patient Name: Jessica [name] ?Procedure Date: 08/15/2021 10:05 AM ?MRN: [id number] ?Endoscopist: Jerene Bears , MD ?Age: 71 ?Referring MD:  ?Date of Birth: Jul 16, 1950 ?Gender: Female ?Account #: [id number] ?Procedure:                Upper GI endoscopy ?Indications:              Gastro-esophageal reflux disease, Nausea, previous  ?                          upper abd pain, hx of celiac disease ?Medicines:                Monitored Anesthesia Care ?Procedure:                Pre-Anesthesia Assessment: ?                          - Prior to the procedure, a History and Physical  ?                          was performed, and patient medications and  ?                          allergies were reviewed. The patient's tolerance of  ?                          previous anesthesia was also reviewed. The risks  ?                          and benefits of the procedure and the sedation  ?                          options and risks were discussed with the patient.  ?                          All questions were answered, and informed consent  ?                          was obtained. Prior Anticoagulants: The patient has  ?                          taken no previous anticoagulant or antiplatelet  ?                          agents. ASA Grade Assessment: III - A patient with  ?                          severe systemic disease. After reviewing the risks  ?                          and benefits, the patient was deemed in  ?                          satisfactory condition to undergo the procedure. ?  After obtaining informed consent, the endoscope was  ?                          passed under direct vision. Throughout the  ?                          procedure, the patient's blood pressure, pulse, and  ?                          oxygen saturations were monitored continuously. The  ?                          Endoscope was introduced through the mouth, and  ?                          advanced to the  second part of duodenum. The upper  ?                          GI endoscopy was accomplished without difficulty.  ?                          The patient tolerated the procedure well. ?Scope In: ?Scope Out: ?Findings:                 The examined esophagus was normal. ?                          A 2 cm hiatal hernia was present. ?                          The entire examined stomach was normal. ?                          The examined duodenum was normal. ?Complications:            No immediate complications. ?Estimated Blood Loss:     Estimated blood loss: none. ?Impression:               - Normal esophagus. ?                          - 2 cm hiatal hernia. ?                          - Normal stomach. ?                          - Normal examined duodenum. ?                          - No specimens collected. ?Recommendation:           - Patient has a contact number available for  ?                          emergencies. The signs and symptoms of potential  ?  delayed complications were discussed with the  ?                          patient. Return to normal activities tomorrow.  ?                          Written discharge instructions were provided to the  ?                          patient. ?                          - Resume previous diet. ?                          - Continue present medications. Expect some recent  ?                          (now improved) symptoms were side-effect from GLP-1  ?                          agonist. ?Jerene Bears, MD ?08/15/2021 10:39:30 AM ?This report has been signed electronically. ?

## 2021-08-15 NOTE — Progress Notes (Signed)
To Pacu, VSS. Report to [email or web address] ?

## 2021-08-15 NOTE — Patient Instructions (Signed)
Thank you for coming in to see [place] today. ?Resume your normal diet and medications today ?Refills for Dexilant have been sent to your pharmacy. ?Return to normal daily activities tomorrow. ? ? ?YOU HAD AN ENDOSCOPIC PROCEDURE TODAY AT [facility]:   Refer to the procedure report that was given to you for any specific questions about what was found during the examination.  If the procedure report does not answer your questions, please call your gastroenterologist to clarify.  If you requested that your care partner not be given the details of your procedure findings, then the procedure report has been included in a sealed envelope for you to review at your convenience later. ? ?YOU SHOULD EXPECT: Some feelings of bloating in the abdomen. Passage of more gas than usual.  Walking can help get rid of the air that was put into your GI tract during the procedure and reduce the bloating. If you had a lower endoscopy (such as a colonoscopy or flexible sigmoidoscopy) you may notice spotting of blood in your stool or on the toilet paper. If you underwent a bowel prep for your procedure, you may not have a normal bowel movement for a few days. ? ?Please Note:  You might notice some irritation and congestion in your nose or some drainage.  This is from the oxygen used during your procedure.  There is no need for concern and it should clear up in a day or so. ? ?SYMPTOMS TO REPORT IMMEDIATELY: ? ? ? ?Following upper endoscopy (EGD) ? Vomiting of blood or coffee ground material ? New chest pain or pain under the shoulder blades ? Painful or persistently difficult swallowing ? New shortness of breath ? Fever of 100?F or higher ? Black, tarry-looking stools ? ?For urgent or emergent issues, a gastroenterologist can be reached at any hour by calling [phone]. ?Do not use MyChart messaging for urgent concerns.  ? ? ?DIET:  We do recommend a small meal at first, but then you may proceed to your regular diet.   Drink plenty of fluids but you should avoid alcoholic beverages for 24 hours. ? ?ACTIVITY:  You should plan to take it easy for the rest of today and you should NOT DRIVE or use heavy machinery until tomorrow (because of the sedation medicines used during the test).   ? ?FOLLOW UP: ?Our staff will call the number listed on your records 48-72 hours following your procedure to check on you and address any questions or concerns that you may have regarding the information given to you following your procedure. If we do not reach you, we will leave a message.  We will attempt to reach you two times.  During this call, we will ask if you have developed any symptoms of COVID 19. If you develop any symptoms (ie: fever, flu-like symptoms, shortness of breath, cough etc.) before then, please call [phone].  If you test positive for Covid 19 in the 2 weeks post procedure, please call and report this information to [place].   ? ?If any biopsies were taken you will be contacted by phone or by letter within the next 1-3 weeks.  Please call us at [phone] if you have not heard about the biopsies in 3 weeks.  ? ? ?SIGNATURES/CONFIDENTIALITY: ?You and/or your care partner have signed paperwork which will be entered into your electronic medical record.  These signatures attest to the fact that that the information above on your After Visit Summary has been  reviewed and is understood.  Full responsibility of the confidentiality of this discharge information lies with you and/or your care-partner.  ?

## 2021-08-16 ENCOUNTER — Other Ambulatory Visit (INDEPENDENT_AMBULATORY_CARE_PROVIDER_SITE_OTHER): Payer: Self-pay | Admitting: Bariatrics

## 2021-08-16 DIAGNOSIS — E1169 Type 2 diabetes mellitus with other specified complication: Secondary | ICD-10-CM

## 2021-08-17 ENCOUNTER — Ambulatory Visit (INDEPENDENT_AMBULATORY_CARE_PROVIDER_SITE_OTHER): Payer: Medicare Other | Admitting: Bariatrics

## 2021-08-17 ENCOUNTER — Telehealth: Payer: Self-pay | Admitting: *Deleted

## 2021-08-17 ENCOUNTER — Encounter (INDEPENDENT_AMBULATORY_CARE_PROVIDER_SITE_OTHER): Payer: Self-pay | Admitting: Bariatrics

## 2021-08-17 VITALS — BP 133/75 | HR 77 | Temp 97.8°F | Ht 65.0 in | Wt 204.0 lb

## 2021-08-17 DIAGNOSIS — Z6833 Body mass index (BMI) 33.0-33.9, adult: Secondary | ICD-10-CM

## 2021-08-17 DIAGNOSIS — E669 Obesity, unspecified: Secondary | ICD-10-CM

## 2021-08-17 DIAGNOSIS — E1169 Type 2 diabetes mellitus with other specified complication: Secondary | ICD-10-CM

## 2021-08-17 DIAGNOSIS — Z7985 Long-term (current) use of injectable non-insulin antidiabetic drugs: Secondary | ICD-10-CM

## 2021-08-17 DIAGNOSIS — E782 Mixed hyperlipidemia: Secondary | ICD-10-CM

## 2021-08-17 MED ORDER — TRULICITY 1.5 MG/0.5ML ~~LOC~~ SOAJ: 1.5000 mg | SUBCUTANEOUS | 0 refills | Status: DC

## 2021-08-17 NOTE — Telephone Encounter (Signed)
?  Follow up Call- ? ? ?  08/15/2021  ?  8:59 AM 10/14/2019  ?  7:08 AM  ?Call back number  ?Post procedure Call Back phone  # [phone]  ?Permission to leave phone message Yes Yes  ?  ? ?Patient questions: ? ?Do you have a fever, pain , or abdominal swelling? No. ?Pain Score  0 * ? ?Have you tolerated food without any problems? Yes.   ? ?Have you been able to return to your normal activities? Yes.   ? ?Do you have any questions about your discharge instructions: ?Diet   No. ?Medications  No. ?Follow up visit  No. ? ?Do you have questions or concerns about your Care? No. ? ?Actions: ?* If pain score is 4 or above: ?No action needed, pain <4. ? ? ?

## 2021-08-19 ENCOUNTER — Ambulatory Visit (INDEPENDENT_AMBULATORY_CARE_PROVIDER_SITE_OTHER): Payer: Medicare Other

## 2021-08-19 ENCOUNTER — Ambulatory Visit (INDEPENDENT_AMBULATORY_CARE_PROVIDER_SITE_OTHER): Payer: Medicare Other | Admitting: Sports Medicine

## 2021-08-19 VITALS — BP 140/78 | HR 72 | Ht 65.0 in | Wt 204.0 lb

## 2021-08-19 DIAGNOSIS — M25551 Pain in right hip: Secondary | ICD-10-CM

## 2021-08-19 DIAGNOSIS — M5441 Lumbago with sciatica, right side: Secondary | ICD-10-CM | POA: Diagnosis not present

## 2021-08-19 DIAGNOSIS — G8929 Other chronic pain: Secondary | ICD-10-CM

## 2021-08-19 DIAGNOSIS — M545 Low back pain, unspecified: Secondary | ICD-10-CM | POA: Diagnosis not present

## 2021-08-19 DIAGNOSIS — M5136 Other intervertebral disc degeneration, lumbar region: Secondary | ICD-10-CM | POA: Diagnosis not present

## 2021-08-19 MED ORDER — METHYLPREDNISOLONE ACETATE 80 MG/ML IJ SUSP
80.0000 mg | Freq: Once | INTRAMUSCULAR | Status: AC
  Administered 2021-08-19: 80 mg via INTRAMUSCULAR

## 2021-08-19 NOTE — Progress Notes (Signed)
? ? Jessica [name] ?[facility] ?[place] ?Phone: [phone] ?  ?Assessment and Plan:   ?  ?1. Chronic bilateral low back pain with right-sided sciatica ?2. Right hip pain ?3. DDD (degenerative disc disease), lumbar ?-Chronic with exacerbation, subsequent sports medicine visit ?- Flare of right-sided low back pain with radicular symptoms starting when patient woke up yesterday, 08/18/2021 ?- Suspect lumbar etiology based on HPI, physical exam, imaging ? ?-Current treatment plan: Methylprednisone 80 mg IM injection provided in clinic today.  As soon as you get home take two tablets of Advil 200 mg then after you get back from funeral can take tizanidine 4 mg and tonight take an additional two tablets of Advil 200 mg and gabapentin  ?Start tomorrow start Advil 2 tablets 200 mg 3 times a day gabapentin twice a day tizanidine twice a day  ?Heating pads / bath ? ?-Of note, patient has had difficulty with medications in the past including tramadol, Toradol, meloxicam with history of celiac.  Most medicines upset her stomach. ?- X-rays obtained in clinic.  My interpretation: No acute fracture or dislocation or vertebral collapse.  Progressive degeneration of L5 with mildly increased anterior listhesis L4 on L5.  Lumbar DDD ? ?Follow-up in 1 week ?  ?Pertinent previous records reviewed include lumbar x-ray 2019 ?  ?Follow Up: 1 week for reevaluation.  If no improvement or worsening of symptoms, would consider lumbar MRI ?  ?Subjective:   ?I, Jessica [name], am serving as a Education administrator for [employer] ? ?Chief Complaint: back pain  ? ?HPI:  ? ?08/19/21 ?Patient is a 71 year old female complaining of back pain. Patient states that she woke up like this yesterday , she thinks its a sciatica flare but when she got on the floor but this is a different kind of pain it feels like a" kidney " pain, she tried a muscle relaxer and other pain meds from Dr. Tamala Julian  but those didn't help, she has pain that make her sweat or sick, she tried heat and ice and it didn't work her pain is a 10+/10 she denise MOI she just woke up in pain, she helps her husband who a disabled vet so she stays busy is getting numbness tingling it feels like bees are around her feet, has been taking gabapentin anything she can think of  ? ?Relevant Historical Information: Celiac, GERD, hypertension, DM type II ? ?Additional pertinent review of systems negative. ? ? ?Current Outpatient Medications:  ?  albuterol (VENTOLIN HFA) 108 (90 Base) MCG/ACT inhaler, Inhale 2 puffs into the lungs every 6 (six) hours as needed for wheezing or shortness of breath., Disp: 8 g, Rfl: 3 ?  aspirin EC 81 MG tablet, Take 81 mg by mouth daily. Swallow whole., Disp: , Rfl:  ?  atorvastatin (LIPITOR) 40 MG tablet, TAKE 1 TABLET BY MOUTH EVERY DAY, Disp: 90 tablet, Rfl: 3 ?  BD INSULIN SYRINGE U/F 31G X 5/16" 1 ML MISC, USE UP TO 3 TIMES A DAY, Disp: 100 each, Rfl: 5 ?  dexlansoprazole (DEXILANT) 60 MG capsule, TAKE 1 CAPSULE BY MOUTH EVERY DAY, Disp: 90 capsule, Rfl: 3 ?  Dulaglutide (TRULICITY) 1.5 GN/0.0BB SOPN, Inject 1.5 mg into the skin once a week., Disp: 2 mL, Rfl: 0 ?  fluticasone (FLONASE) 50 MCG/ACT nasal spray, Place 1 spray into both nostrils daily., Disp: 16 g, Rfl: 6 ?  furosemide (LASIX) 20 MG tablet, TAKE 1 TABLET (20  MG TOTAL) BY MOUTH DAILY AS NEEDED FOR FLUID OR EDEMA., Disp: 90 tablet, Rfl: 3 ?  gabapentin (NEURONTIN) 600 MG tablet, TAKE 1 TABLET IN THE MORNING AND 2 TABLETS AT BEDTIME, Disp: 270 tablet, Rfl: 2 ?  ibuprofen (ADVIL) 200 MG tablet, Take 200 mg by mouth daily., Disp: , Rfl:  ?  insulin NPH-regular Human (HUMULIN 70/30) (70-30) 100 UNIT/ML injection, 40 UNITS WITH BREAKFAST, AND 8 UNITS WITH SUPPER., Disp: 20 mL, Rfl: 8 ?  ketoconazole (NIZORAL) 2 % cream, APPLY TWICE A DAY FOR 2 WEEKS AS NEEDED FOR RASH, Disp: 30 g, Rfl: 2 ?  olmesartan (BENICAR) 40 MG tablet, TAKE 1 TABLET BY MOUTH EVERY  DAY, Disp: 90 tablet, Rfl: 3 ?  ondansetron (ZOFRAN ODT) 4 MG disintegrating tablet, Take 1 tablet (4 mg total) by mouth every 8 (eight) hours as needed for nausea or vomiting., Disp: 20 tablet, Rfl: 0 ?  ONETOUCH ULTRA test strip, USE 2 TIMES DAILY. AND LANCETS 2/DAY, Disp: 200 strip, Rfl: 3 ?  polyethylene glycol powder ([employer] PURELAX) 17 GM/SCOOP powder, DISSOLVE 1 CAPFUL IN AT LEAST 8 OUNCES WATER/JUICE AND DRINK TWICE DAILY, Disp: 510 g, Rfl: 1 ?  sucralfate (CARAFATE) 1 GM/10ML suspension, Take 10 mLs (1 g total) by mouth 4 (four) times daily., Disp: 420 mL, Rfl: 1 ?  valACYclovir (VALTREX) 500 MG tablet, TAKE 1 TABLET BY MOUTH EVERY DAY, Disp: 90 tablet, Rfl: 3 ?  Vitamin D, Ergocalciferol, (DRISDOL) 1.25 MG (50000 UNIT) CAPS capsule, Take 1 capsule (50,000 Units total) by mouth every 7 (seven) days., Disp: 4 capsule, Rfl: 0  ? ?Objective:   ?  ?Vitals:  ? 08/19/21 0901  ?BP: 140/78  ?Pulse: 72  ?SpO2: 99%  ?Weight: 204 lb (92.5 kg)  ?Height: 5' 5"  (1.651 m)  ?  ?  ?Body mass index is 33.95 kg/m?.  ?  ?Physical Exam:   ? ?Gen: Appears well, nad, nontoxic and pleasant ?Psych: Alert and oriented, appropriate mood and affect ?Neuro: sensation intact, strength is 5/5 in upper and lower extremities, muscle tone wnl ?Skin: no susupicious lesions or rashes ? ?Back - Normal skin, Spine with normal alignment and no deformity.   ?No tenderness to vertebral process palpation.   ?Paraspinous muscles are moderately tender and without spasm ?Straight leg raise positive on right, negative on left ?Piriformis test positive on right, negative on left ?Gait abnormal, favoring left leg. ? ? ?Electronically signed by:  ?Jessica [name] ?[facility] ?9:49 AM 08/19/21 ?

## 2021-08-19 NOTE — Patient Instructions (Addendum)
Good to see you  ?As soon as you get home take two tablets of Advil 200 mg then after you get back from funeral can take tizanidine 4 mg and tonight take an additional two tablets of Advil 200 mg and gabapentin  ?Start tomorrow start Advil 2 tablets 200 mg 3 times a day gabapentin twice a day tizanidine twice a day  ?Heating pads / bath  ?Follow-up in 1 week ?

## 2021-08-23 ENCOUNTER — Ambulatory Visit: Payer: Medicare Other | Admitting: Family Medicine

## 2021-08-24 NOTE — Progress Notes (Signed)
?Jessica [name] D.O. ?[facility] ?[place] ?Phone: [phone] ?Subjective:   ?I, Jessica [name], am serving as a scribe for Dr. Hulan Saas. ? ?This visit occurred during the SARS-CoV-2 public health emergency.  Safety protocols were in place, including screening questions prior to the visit, additional usage of staff PPE, and extensive cleaning of exam room while observing appropriate contact time as indicated for disinfecting solutions.  ? ?I'm seeing this patient by the request  of:  [name], MD ? ?CC: Low back pain and leg pain ? ?[id number]  ?08/19/2021 ?1. Chronic bilateral low back pain with right-sided sciatica ?2. Right hip pain ?3. DDD (degenerative disc disease), lumbar ?-Chronic with exacerbation, subsequent sports medicine visit ?- Flare of right-sided low back pain with radicular symptoms starting when patient woke up yesterday, 08/18/2021 ?- Suspect lumbar etiology based on HPI, physical exam, imaging ?  ?-Current treatment plan: Methylprednisone 80 mg IM injection provided in clinic today.  As soon as you get home take two tablets of Advil 200 mg then after you get back from funeral can take tizanidine 4 mg and tonight take an additional two tablets of Advil 200 mg and gabapentin  ?Start tomorrow start Advil 2 tablets 200 mg 3 times a day gabapentin twice a day tizanidine twice a day  ?Heating pads / bath ? ?-Of note, patient has had difficulty with medications in the past including tramadol, Toradol, meloxicam with history of celiac.  Most medicines upset her stomach. ?- X-rays obtained in clinic.  My interpretation: No acute fracture or dislocation or vertebral collapse.  Progressive degeneration of L5 with mildly increased anterior listhesis L4 on L5.  Lumbar DDD ?  ?Follow-up in 1 week ?  ?Pertinent previous records reviewed include lumbar x-ray 2019 ?  ?Follow Up: 1 week for reevaluation.  If no improvement or worsening of symptoms, would  consider lumbar MRI ? ?Updated 08/25/2021 ?Jessica [name] is a 71 y.o. female coming in with complaint of back pain. Pain over R glute and SI joint that radiates down into ankle. Also having radiating pain that is present in the left lower leg. Pain is worsening. Pain is constant despite position. Taking Advil, Gabapentin, and mm relaxer. Having a hard time sleeping.  ? ? ? ?  ? ?Past Medical History:  ?Diagnosis Date  ? Anxiety   ? 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 Asthma   ? childhood  ? Autonomic neuropathy   ? diabetic  ? Cataract   ? Celiac disease   ? Constipation   ? Depression   ? Diabetes mellitus   ? type II, Hemoglobin A1C 9.9 10/05/2011  ? Diabetic neuropathy ([place])   ? Diverticulosis   ? Fatty liver   ? Gastroparesis   ? GERD (gastroesophageal reflux disease)   ? Hyperlipidemia   ? Hypertension   ? IBS (irritable bowel syndrome)   ? Kidney stones   ? Personal history of colonic polyps 03/2010  ? hyperplastic  ? Shingles 2021  ? ?Past Surgical History:  ?Procedure Laterality Date  ? APPENDECTOMY    ? CATARACT EXTRACTION Right 04/29/2018  ? CATARACT EXTRACTION Left 03/2018  ? COLONOSCOPY    ? DILATATION & CURRETTAGE/HYSTEROSCOPY WITH RESECTOCOPE N/A 03/24/2013  ? Procedure: DILATATION & CURETTAGE, HYSTEROSCOPY WITH RESECTION;  Surgeon: Olga Millers, MD;  Location: [facility];  Service: Gynecology;  Laterality: N/A;  ? HYSTEROSCOPY WITH D & C N/A 11/08/2015  ? Procedure: DILATATION AND  CURETTAGE /HYSTEROSCOPY;  Surgeon: Olga Millers, MD;  Location: [facility];  Service: Gynecology;  Laterality: N/A;  ? left breast cyst removal  2000  ? LEFT HEART CATH AND CORONARY ANGIOGRAPHY N/A 10/31/2018  ? Procedure: LEFT HEART CATH AND CORONARY ANGIOGRAPHY;  Surgeon: Jettie Booze, MD;  Location: [facility];  Service: Cardiovascular;  Laterality: N/A;  ? TUBAL LIGATION    ? ?Social History  ? ?Socioeconomic History  ? Marital status: Married  ?  Spouse name: Devar Whitter  ? Number of children: 2  ? Years of  education: Not on file  ? Highest education level: Not on file  ?Occupational History  ? Occupation: Scientist, water quality at [employer]  ?  Employer: RETIRED  ?  Comment: parttime  ? Occupation: retired  ?Tobacco Use  ? Smoking status: Never  ? Smokeless tobacco: Never  ?Vaping Use  ? Vaping Use: Never used  ?Substance and Sexual Activity  ? Alcohol use: No  ? Drug use: No  ? Sexual activity: Yes  ?  Birth control/protection: Post-menopausal, Surgical  ?Other Topics Concern  ? Not on file  ?Social History Narrative  ? Not on file  ? ?Social Determinants of Health  ? ?Financial Resource Strain: Low Risk   ? Difficulty of Paying Living Expenses: Not hard at all  ?Food Insecurity: No Food Insecurity  ? Worried About Charity fundraiser in the Last Year: Never true  ? Ran Out of Food in the Last Year: Never true  ?Transportation Needs: No Transportation Needs  ? Lack of Transportation (Medical): No  ? Lack of Transportation (Non-Medical): No  ?Physical Activity: Sufficiently Active  ? Days of Exercise per Week: 7 days  ? Minutes of Exercise per Session: 60 min  ?Stress: Stress Concern Present  ? Feeling of Stress : To some extent  ?Social Connections: Moderately Integrated  ? Frequency of Communication with Friends and Family: More than three times a week  ? Frequency of Social Gatherings with Friends and Family: Once a week  ? Attends Religious Services: More than 4 times per year  ? Active Member of Clubs or Organizations: No  ? Attends Archivist Meetings: Never  ? Marital Status: Married  ? ?Allergies  ?Allergen Reactions  ? Gluten Meal Other (See Comments)  ?  No wheat or soy  ? Nsaids Nausea And Vomiting and Other (See Comments)  ?  Cannot tolerate large doses   ? Soy Allergy   ? Wheat Bran   ? ?Family History  ?Problem Relation Age of Onset  ? Hypertension Mother   ? Colon cancer Sister   ?     dx in her early 24s  ? Diabetes Sister   ? Endometrial cancer Sister   ? Hypertension Brother   ? Other Father    ?     2 collapsed lungs  ? COPD Father   ? Diabetes Father   ? Heart attack Father   ? Heart failure Father   ? High blood pressure Father   ? Colon polyps Neg Hx   ? Esophageal cancer Neg Hx   ? Stomach cancer Neg Hx   ? ? ?Current Outpatient Medications (Endocrine & Metabolic):  ?  Dulaglutide (TRULICITY) 1.5 DJ/2.4QA SOPN, Inject 1.5 mg into the skin once a week. ?  insulin NPH-regular Human (HUMULIN 70/30) (70-30) 100 UNIT/ML injection, 40 UNITS WITH BREAKFAST, AND 8 UNITS WITH SUPPER. ? ?Current Outpatient Medications (Cardiovascular):  ?  atorvastatin (LIPITOR)  40 MG tablet, TAKE 1 TABLET BY MOUTH EVERY DAY ?  furosemide (LASIX) 20 MG tablet, TAKE 1 TABLET (20 MG TOTAL) BY MOUTH DAILY AS NEEDED FOR FLUID OR EDEMA. ?  olmesartan (BENICAR) 40 MG tablet, TAKE 1 TABLET BY MOUTH EVERY DAY ? ?Current Outpatient Medications (Respiratory):  ?  albuterol (VENTOLIN HFA) 108 (90 Base) MCG/ACT inhaler, Inhale 2 puffs into the lungs every 6 (six) hours as needed for wheezing or shortness of breath. ?  fluticasone (FLONASE) 50 MCG/ACT nasal spray, Place 1 spray into both nostrils daily. ? ?Current Outpatient Medications (Analgesics):  ?  aspirin EC 81 MG tablet, Take 81 mg by mouth daily. Swallow whole. ?  ibuprofen (ADVIL) 200 MG tablet, Take 200 mg by mouth daily. ? ? ?Current Outpatient Medications (Other):  ?  BD INSULIN SYRINGE U/F 31G X 5/16" 1 ML MISC, USE UP TO 3 TIMES A DAY ?  dexlansoprazole (DEXILANT) 60 MG capsule, TAKE 1 CAPSULE BY MOUTH EVERY DAY ?  gabapentin (NEURONTIN) 600 MG tablet, TAKE 1 TABLET IN THE MORNING AND 2 TABLETS AT BEDTIME ?  ketoconazole (NIZORAL) 2 % cream, APPLY TWICE A DAY FOR 2 WEEKS AS NEEDED FOR RASH ?  ondansetron (ZOFRAN ODT) 4 MG disintegrating tablet, Take 1 tablet (4 mg total) by mouth every 8 (eight) hours as needed for nausea or vomiting. ?  ONETOUCH ULTRA test strip, USE 2 TIMES DAILY. AND LANCETS 2/DAY ?  polyethylene glycol powder ([employer] PURELAX) 17 GM/SCOOP powder, DISSOLVE 1  CAPFUL IN AT LEAST 8 OUNCES WATER/JUICE AND DRINK TWICE DAILY ?  sucralfate (CARAFATE) 1 GM/10ML suspension, Take 10 mLs (1 g total) by mouth 4 (four) times daily. ?  valACYclovir (VALTREX) 500 MG tablet, TAK

## 2021-08-25 ENCOUNTER — Ambulatory Visit (INDEPENDENT_AMBULATORY_CARE_PROVIDER_SITE_OTHER): Payer: Medicare Other | Admitting: Family Medicine

## 2021-08-25 DIAGNOSIS — M5416 Radiculopathy, lumbar region: Secondary | ICD-10-CM | POA: Diagnosis not present

## 2021-08-25 MED ORDER — DIAZEPAM 5 MG PO TABS: ORAL_TABLET | ORAL | 0 refills | Status: DC

## 2021-08-25 MED ORDER — METHYLPREDNISOLONE ACETATE 40 MG/ML IJ SUSP
40.0000 mg | Freq: Once | INTRAMUSCULAR | Status: AC
  Administered 2021-08-25: 40 mg via INTRAMUSCULAR

## 2021-08-25 MED ORDER — KETOROLAC TROMETHAMINE 30 MG/ML IJ SOLN
30.0000 mg | Freq: Once | INTRAMUSCULAR | Status: AC
  Administered 2021-08-25: 30 mg via INTRAMUSCULAR

## 2021-08-25 NOTE — Addendum Note (Signed)
Addended by: Lyndal Pulley on: 08/25/2021 03:09 PM ? ? Modules accepted: Orders ? ?

## 2021-08-25 NOTE — Patient Instructions (Signed)
I am so sorry you are hurting ?MRI of the lumbar and pelvis ordered ?2 injections today but no more ibuprofen til tomorrow  ?PLEASE if worsening pain then go to emergency room  ?I will try to write you as soon as I see the results and discuss next steps  ?

## 2021-08-25 NOTE — Assessment & Plan Note (Signed)
Patient does have lumbar radiculopathy patient does have radicular symptoms that are more consistent with the L4-L5.  Patient denies any signs of a kidney stone, denies any dysuria, denies any fevers or chills, patient is having no worsening pain that I have seen her.  At this point I do feel MRI is necessary.  Patient does have progressive arthritic changes with anterior listhesis at L4-L5 that is consistent with her symptoms.  Depending on the MRI could be a candidate for possible epidurals versus the possibility of surgical intervention.  Patient declined any other type of narcotics today.  Patient also declined any laboratory work-up.  Patient knows that if any significant worsening pain she needs to seek medical attention immediately and go to the emergency room.  Patient verbalizes understanding.  Has been taking ibuprofen fairly regularly but of asked her to hold it at this moment while we give her Toradol and Depo-Medrol ?

## 2021-08-27 ENCOUNTER — Ambulatory Visit (INDEPENDENT_AMBULATORY_CARE_PROVIDER_SITE_OTHER): Payer: Medicare Other

## 2021-08-27 DIAGNOSIS — R102 Pelvic and perineal pain: Secondary | ICD-10-CM

## 2021-08-27 DIAGNOSIS — M488X6 Other specified spondylopathies, lumbar region: Secondary | ICD-10-CM

## 2021-08-27 DIAGNOSIS — M5416 Radiculopathy, lumbar region: Secondary | ICD-10-CM | POA: Diagnosis not present

## 2021-08-27 NOTE — Progress Notes (Signed)
? ? ? ?Chief Complaint:  ? ?OBESITY ?Jessica [name] is here to discuss her progress with her obesity treatment plan along with follow-up of her obesity related diagnoses. Jessica [name] is on the Category 3 Plan and states she is following her eating plan approximately 90% of the time. Jessica [name] states she is not currently exercising. ? ?Today's visit was #: 46 ?Starting weight: 246 lbs ?Starting date: 03/12/2019 ?Today's weight: 204 lbs ?Today's date: 08/17/2021 ?Total lbs lost to date: 42 lbs ?Total lbs lost since last in-office visit: 3 lbs ? ?Interim History:  ?Jessica [name] is down an additional 3 lbs since her last visit.  ? ?Subjective:  ? ?1. Type 2 diabetes mellitus with obesity ([place]) ?Jessica [name] is currently taking Trulicity 7.32 mg once weekly. She is tolerating Trulicity well.  ? ?2. Combined hyperlipidemia ?Jessica [name] has hyperlipidemia and has been trying to improve her cholesterol levels with intensive lifestyle modifications including a low saturated fat diet, exercise, and weight loss. She denies any chest pain, claudication, or myalgias. She is currently taking Lipitor. ? ?Lab Results  ?Component Value Date  ? ALT 14 06/09/2021  ? AST 16 06/09/2021  ? ALKPHOS 69 06/09/2021  ? BILITOT 0.7 06/09/2021  ? ?Lab Results  ?Component Value Date  ? CHOL 135 04/13/2021  ? HDL 59.70 04/13/2021  ? [place] 65 04/13/2021  ? LDLDIRECT 143.2 11/23/2006  ? TRIG 50.0 04/13/2021  ? CHOLHDL 2 04/13/2021  ?  ? ?Assessment/Plan:  ? ?1. Type 2 diabetes mellitus with obesity ([place]) ?We will increase Trulicity to 1.5 mg for 1 month with no refills. Good blood sugar control is important to decrease the likelihood of diabetic complications such as nephropathy, neuropathy, limb loss, blindness, coronary artery disease, and death. Intensive lifestyle modification including diet, exercise and weight loss are the first line of treatment for diabetes.  ?- Dulaglutide (TRULICITY) 1.5 KG/2.5KY SOPN; Inject 1.5 mg into the skin once a week.  Dispense: 2 mL; Refill:  0 ? ?2. Combined hyperlipidemia ?Jessica [name] will continue taking Lipitor. Cardiovascular risk and specific lipid/LDL goals reviewed.  We discussed several lifestyle modifications today and [place] will continue to work on diet, exercise and weight loss efforts. Orders and follow up as documented in patient record.  ? ?Counseling ?Intensive lifestyle modifications are the first line treatment for this issue. ?Dietary changes: Increase soluble fiber. Decrease simple carbohydrates. ?Exercise changes: Moderate to vigorous-intensity aerobic activity 150 minutes per week if tolerated. ?Lipid-lowering medications: see documented in medical record. ? ?3. Obesity with current BMI of 33.9 ?Jessica [name] is currently in the action stage of change. As such, her goal is to continue with weight loss efforts. She has agreed to the Category 3 Plan.  ? ?Jessica [name] will continue with meal planning and mindful eating. ? ?Exercise goals: As is ? ?Behavioral modification strategies: increasing lean protein intake, decreasing simple carbohydrates, increasing vegetables, increasing water intake, decreasing eating out, no skipping meals, meal planning and cooking strategies, and keeping healthy foods in the home. ? ?Jessica [name] has agreed to follow-up with our clinic in 4-5 weeks. She was informed of the importance of frequent follow-up visits to maximize her success with intensive lifestyle modifications for her multiple health conditions.  ? ?Objective:  ? ?Blood pressure 133/75, pulse 77, temperature 97.8 ?F (36.6 ?C), height 5' 5"  (1.651 m), weight 204 lb (92.5 kg), SpO2 97 %. ?Body mass index is 33.95 kg/m?. ? ?General: Cooperative, alert, well developed, in no acute distress. ?HEENT: Conjunctivae and lids unremarkable. ?Cardiovascular: Regular rhythm.  ?Lungs: Normal work of  breathing. ?Neurologic: No focal deficits.  ? ?Lab Results  ?Component Value Date  ? CREATININE 0.95 06/09/2021  ? BUN 23 06/09/2021  ? NA 138 06/09/2021  ? K 4.6 06/09/2021  ? CL  102 06/09/2021  ? CO2 33 (H) 06/09/2021  ? ?Lab Results  ?Component Value Date  ? ALT 14 06/09/2021  ? AST 16 06/09/2021  ? ALKPHOS 69 06/09/2021  ? BILITOT 0.7 06/09/2021  ? ?Lab Results  ?Component Value Date  ? HGBA1C 7.7 (A) 04/13/2021  ? HGBA1C 8.2 (A) 01/12/2021  ? HGBA1C 8.1 (A) 08/12/2020  ? HGBA1C 7.8 (A) 05/14/2020  ? HGBA1C 7.1 (A) 12/31/2019  ? ?No results found for: INSULIN ?Lab Results  ?Component Value Date  ? TSH 0.91 04/13/2021  ? ?Lab Results  ?Component Value Date  ? CHOL 135 04/13/2021  ? HDL 59.70 04/13/2021  ? [place] 65 04/13/2021  ? LDLDIRECT 143.2 11/23/2006  ? TRIG 50.0 04/13/2021  ? CHOLHDL 2 04/13/2021  ? ?Lab Results  ?Component Value Date  ? VD25OH 44.70 04/13/2021  ? VD25OH 85.0 09/07/2020  ? VD25OH 61.6 03/11/2020  ? ?Lab Results  ?Component Value Date  ? WBC 3.4 (L) 06/09/2021  ? HGB 14.7 06/09/2021  ? HCT 43.7 06/09/2021  ? MCV 99.0 06/09/2021  ? PLT 214.0 06/09/2021  ? ?Lab Results  ?Component Value Date  ? IRON 113 09/07/2020  ? TIBC 276 09/07/2020  ? FERRITIN 149 09/07/2020  ? ? ?Obesity Behavioral Intervention:  ? ?Approximately 15 minutes were spent on the discussion below. ? ?ASK: ?We discussed the diagnosis of obesity with Jessica [name] today and Jessica [name] agreed to give [place] permission to discuss obesity behavioral modification therapy today. ? ?ASSESS: ?Jessica [name] has the diagnosis of obesity and her BMI today is 33.9. Jessica [name] is in the action stage of change.  ? ?ADVISE: ?Jessica [name] was educated on the multiple health risks of obesity as well as the benefit of weight loss to improve her health. She was advised of the need for long term treatment and the importance of lifestyle modifications to improve her current health and to decrease her risk of future health problems. ? ?AGREE: ?Multiple dietary modification options and treatment options were discussed and Jessica [name] agreed to follow the recommendations documented in the above note. ? ?ARRANGE: ?Jessica [name] was educated on the importance of frequent  visits to treat obesity as outlined per CMS and USPSTF guidelines and agreed to schedule her next follow up appointment today. ? ?Attestation Statements:  ? ?Reviewed by clinician on day of visit: allergies, medications, problem list, medical history, surgical history, family history, social history, and previous encounter notes. ? ?I, Lennette Bihari, CMA, am acting as transcriptionist for Dr. [name], DO.  ? ?I have reviewed the above documentation for accuracy and completeness, and I agree with the above. [name], DO ? ?

## 2021-08-29 NOTE — Progress Notes (Signed)
? ?Chronic Care Management ?Pharmacy Note ? ?08/31/2021 ?Name:  Ruhama Lehew Areola MRN:  [id number] DOB:  Jan 09, 1951 ? ?Summary: ?Was not able to tolerate Lexapro.  She says it caused nausea after one dose and she stopped.  I sense there is still some depression with her ongoing pain and taking care of her husband.  Not home monitoring glucose currently.  Trulicity up to 8.9QJ weekly.  Has not recheck A1c since starting.   ?Recommended A1c recheck, water aerobics. ?FU 6 months ? ? ?Subjective: ?Ivan Maskell Vanliew is an 71 y.o. year old female who is a primary patient of [name], MD.  The CCM team was consulted for assistance with disease management and care coordination needs.   ? ?Engaged with patient by telephone for follow up visit in response to provider referral for pharmacy case management and/or care coordination services.  ? ?Consent to Services:  ?The patient was given the following information about Chronic Care Management services today, agreed to services, and gave verbal consent: 1. CCM service includes personalized support from designated clinical staff supervised by the primary care provider, including individualized plan of care and coordination with other care providers 2. 24/7 contact phone numbers for assistance for urgent and routine care needs. 3. Service will only be billed when office clinical staff spend 20 minutes or more in a month to coordinate care. 4. Only one practitioner may furnish and bill the service in a calendar month. 5.The patient may stop CCM services at any time (effective at the end of the month) by phone call to the office staff. 6. The patient will be responsible for cost sharing (co-pay) of up to 20% of the service fee (after annual deductible is met). Patient agreed to services and consent obtained. ? ?Patient Care Team: ?[name], MD as PCP - General (Family Medicine) ?Jettie Booze, MD as PCP - Cardiology (Cardiology) ?Olga Millers, MD as Attending  Physician (Obstetrics and Gynecology) ?Renato Shin, MD as Consulting Physician (Endocrinology) ?Pyrtle, Lajuan Lines, MD as Consulting Physician (Gastroenterology) ?Stephannie Li, OD as Consulting Physician (Ophthalmology) ?Hyatt, Max T, DPM as Veterinary surgeon) ?Edythe Clarity, [facility] as Pharmacist (Pharmacist) ? ?Recent office visits:  ?04/13/2021 OV (PCP) [name], MD;  recommend starting Lexapro daily ?  ?Recent consult visits:  ?03/10/2021 OV (MWM) [name] A, DO; no medication changes indicated. ?  ?02/09/2021 OV (MWM) [name] A, DO; no medication changes indicated. ?  ?02/09/2021 (Patient Message with Endocrinology) Renato Shin, MD; Increase Ozempic to 0.5 mg ?  ?01/12/2021 OV (MWM) [name] A, DO; no medication changes indicated. ?  ?12/28/2020 VV (Endocrinology) Renato Shin, MD; Please change the insulin to 40 units with breakfast, and 8 units with supper.   ?check your blood sugar twice a day.  vary the time of day when you check, between before the 3 meals, and at bedtime.  also check if you have symptoms of your blood sugar being too high or too low.  please keep a record of the readings and bring it to your next appointment here (or you can bring the meter itself).   ?  ?12/01/2020 OV (MWM) [name] A, DO; no medication changes indicated. ?  ?11/18/2020 OV (Podiatry) [place], Max T, [place]; no medication changes indicated. ?  ?11/03/2020 OV (MWM) [name] A, DO; no medication changes indicated. ?  ?Hospital visits:  ?None in previous 6 months ? ? ?Objective: ? ?Lab Results  ?Component Value Date  ?  CREATININE 0.95 06/09/2021  ? BUN 23 06/09/2021  ? GFR 60.62 06/09/2021  ? GFRNONAA 61 03/11/2020  ? GFRAA 70 03/11/2020  ? NA 138 06/09/2021  ? K 4.6 06/09/2021  ? CALCIUM 9.7 06/09/2021  ? CO2 33 (H) 06/09/2021  ? GLUCOSE 154 (H) 06/09/2021  ? ? ?Lab Results  ?Component Value Date/Time  ? HGBA1C 7.7 (A) 04/13/2021 11:20 AM  ? HGBA1C 8.2 (A) 01/12/2021 10:28 AM  ?  HGBA1C 6.7 (H) 07/24/2019 04:30 PM  ? HGBA1C 7.1 (H) 07/17/2017 04:13 PM  ? GFR 60.62 06/09/2021 10:10 AM  ? GFR 58.47 (L) 04/13/2021 11:49 AM  ? MICROALBUR 0.7 10/25/2017 02:14 PM  ? MICROALBUR 0.7 10/02/2016 03:21 PM  ?  ?Last diabetic Eye exam:  ?Lab Results  ?Component Value Date/Time  ? HMDIABEYEEXA Retinopathy (A) 04/05/2020 12:00 AM  ?  ?Last diabetic Foot exam: No results found for: HMDIABFOOTEX  ? ?Lab Results  ?Component Value Date  ? CHOL 135 04/13/2021  ? HDL 59.70 04/13/2021  ? [place] 65 04/13/2021  ? LDLDIRECT 143.2 11/23/2006  ? TRIG 50.0 04/13/2021  ? CHOLHDL 2 04/13/2021  ? ? ? ?  Latest Ref Rng & Units 06/09/2021  ? 10:10 AM 04/13/2021  ? 11:49 AM 10/07/2020  ? 11:35 AM  ?Hepatic Function  ?Total Protein 6.0 - 8.3 g/dL 6.9   6.4   6.9    ?Albumin 3.5 - 5.2 g/dL 3.9   3.8   4.0    ?AST 0 - 37 U/L 16   14   15     ?ALT 0 - 35 U/L 14   16   14     ?Alk Phosphatase 39 - 117 U/L 69   71   72    ?Total Bilirubin 0.2 - 1.2 mg/dL 0.7   0.5   0.5    ? ? ?Lab Results  ?Component Value Date/Time  ? TSH 0.91 04/13/2021 11:49 AM  ? TSH 1.320 09/07/2020 12:31 PM  ? FREET4 1.18 09/07/2020 12:31 PM  ? ? ? ?  Latest Ref Rng & Units 06/09/2021  ? 10:10 AM 04/13/2021  ? 11:49 AM 09/07/2020  ? 12:31 PM  ?CBC  ?WBC 4.0 - 10.5 K/uL 3.4   3.3     ?Hemoglobin 12.0 - 15.0 g/dL 14.7   13.4     ?Hematocrit 36.0 - 46.0 % 43.7   40.2   42.6    ?Platelets 150.0 - 400.0 K/uL 214.0   193.0     ? ? ?Lab Results  ?Component Value Date/Time  ? VD25OH 44.70 04/13/2021 11:49 AM  ? VD25OH 85.0 09/07/2020 12:31 PM  ? VD25OH 61.6 03/11/2020 10:38 AM  ? VD25OH 29.57 (L) 11/03/2015 12:10 PM  ? ? ?Clinical ASCVD: No  ?The 10-year ASCVD risk score (Arnett DK, et al., 2019) is: 22.4% ?  Values used to calculate the score: ?    Age: 16 years ?    Sex: Female ?    Is Non-Hispanic African American: Yes ?    Diabetic: Yes ?    Tobacco smoker: No ?    Systolic Blood Pressure: 696 mmHg ?    Is BP treated: Yes ?    HDL Cholesterol: 59.7 mg/dL ?    Total  Cholesterol: 135 mg/dL   ? ? ?  04/13/2021  ? 11:42 AM 02/24/2021  ?  2:33 PM 04/05/2020  ? 10:09 AM  ?Depression screen PHQ 2/9  ?Decreased Interest 2 0 2  ?Down, Depressed, Hopeless 1 0  2  ?PHQ - 2 Score 3 0 4  ?Altered sleeping 3  2  ?Tired, decreased energy 2  2  ?Change in appetite 1  0  ?Feeling bad or failure about yourself  0  0  ?Trouble concentrating 0  0  ?Moving slowly or fidgety/restless 1  0  ?Suicidal thoughts 0  0  ?PHQ-9 Score 10  8  ?Difficult doing work/chores Somewhat difficult    ?  ? ?Social History  ? ?Tobacco Use  ?Smoking Status Never  ?Smokeless Tobacco Never  ? ?BP Readings from Last 3 Encounters:  ?08/25/21 140/70  ?08/19/21 140/78  ?08/17/21 133/75  ? ?Pulse Readings from Last 3 Encounters:  ?08/25/21 76  ?08/19/21 72  ?08/17/21 77  ? ?Wt Readings from Last 3 Encounters:  ?08/25/21 208 lb (94.3 kg)  ?08/19/21 204 lb (92.5 kg)  ?08/17/21 204 lb (92.5 kg)  ? ?BMI Readings from Last 3 Encounters:  ?08/25/21 34.61 kg/m?  ?08/19/21 33.95 kg/m?  ?08/17/21 33.95 kg/m?  ? ? ?Assessment/Interventions: Review of patient past medical history, allergies, medications, health status, including review of consultants reports, laboratory and other test data, was performed as part of comprehensive evaluation and provision of chronic care management services.  ? ?SDOH:  (Social Determinants of Health) assessments and interventions performed: Yes ? ?Financial Resource Strain: Low Risk   ? Difficulty of Paying Living Expenses: Not hard at all  ? ? ?SDOH Screenings  ? ?Alcohol Screen: Not on file  ?Depression (PHQ2-9): Medium Risk  ? PHQ-2 Score: 10  ?Financial Resource Strain: Low Risk   ? Difficulty of Paying Living Expenses: Not hard at all  ?Food Insecurity: No Food Insecurity  ? Worried About Charity fundraiser in the Last Year: Never true  ? Ran Out of Food in the Last Year: Never true  ?Housing: Low Risk   ? Last Housing Risk Score: 0  ?Physical Activity: Sufficiently Active  ? Days of Exercise per  Week: 7 days  ? Minutes of Exercise per Session: 60 min  ?Social Connections: Moderately Integrated  ? Frequency of Communication with Friends and Family: More than three times a week  ? Frequency of Social Frederico Hamman

## 2021-08-30 ENCOUNTER — Ambulatory Visit (INDEPENDENT_AMBULATORY_CARE_PROVIDER_SITE_OTHER): Payer: Medicare Other | Admitting: Pharmacist

## 2021-08-30 ENCOUNTER — Encounter: Payer: Self-pay | Admitting: Family Medicine

## 2021-08-30 ENCOUNTER — Encounter (INDEPENDENT_AMBULATORY_CARE_PROVIDER_SITE_OTHER): Payer: Self-pay | Admitting: Bariatrics

## 2021-08-30 DIAGNOSIS — F4321 Adjustment disorder with depressed mood: Secondary | ICD-10-CM

## 2021-08-30 DIAGNOSIS — E118 Type 2 diabetes mellitus with unspecified complications: Secondary | ICD-10-CM

## 2021-08-31 NOTE — Patient Instructions (Addendum)
Visit Information ? ? Goals Addressed   ? ?  ?  ?  ?  ? This Visit's Progress  ?  Set My Target A1C-Diabetes Type 2   On track  ?  Timeframe:  Long-Range Goal ?Priority:  High ?Start Date:  04/27/21                           ?Expected End Date: 10/26/21                     ? ?Follow Up Date 07/26/21  ?  ?- set target A1C  ?  ?Why is this important?   ?Your target A1C is decided together by you and your doctor.  ?It is based on several things like your age and other health issues.  ?  ?Notes: A1c < 7 ?  ? ?  ? ?Patient Care Plan: General Pharmacy (Adult)  ?  ? ?Problem Identified: HTN, OSA, Asthma, Type II DM w/ neuropathy, HLD, Osteopenia, Obesity   ?Priority: High  ?Onset Date: 04/27/2021  ?  ? ?Goal: Patient-Specific Goal   ?Note:   ?Current Barriers:  ?Unable to independently afford treatment regimen ?Unable to achieve control of glucose  ? ?Pharmacist Clinical Goal(s):  ?Patient will achieve improvement in A1c as evidenced by labs through collaboration with PharmD and provider.  ? ?Interventions: ?1:1 collaboration with [name], MD regarding development and update of comprehensive plan of care as evidenced by provider attestation and co-signature ?Inter-disciplinary care team collaboration (see longitudinal plan of care) ?Comprehensive medication review performed; medication list updated in electronic medical record ? ?Hypertension (BP goal <130/80) ?-Controlled, based on most recent in office BP ?-Current treatment: ?Olmesartan 68m ?-Medications previously tried: valsartan, HCTZ  ?-Current home readings: not checking regularly ?-Current dietary habits: see DM ?-Current exercise habits: about 1-2 hours per day, averages 6500 steps per day ?-Denies hypotensive/hypertensive symptoms ?-Educated on BP goals and benefits of medications for prevention of heart attack, stroke and kidney damage; ?Exercise goal of 150 minutes per week; ?Importance of home blood pressure monitoring; ?Symptoms of hypotension and  importance of maintaining adequate hydration; ?-Counseled to monitor BP at home a few times per wek, document, and provide log at future appointments ?-Recommended to continue current medication ?Contact u[place] consistently elevated ? ?Hyperlipidemia: (LDL goal < 70) ?-Controlled ?-Current treatment: ?Atorvastatin 471mdaily ?-Medications previously tried: none noted  ?-Current dietary patterns: see DM ?-Current exercise habits: see HTN ?-Educated on Cholesterol goals;  ?Benefits of statin for ASCVD risk reduction; ?Importance of limiting foods high in cholesterol; ?-Recommended to continue current medication ?Most recent LDL is controled, no concerns with medication at this time ? 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%) ?-Not ideally controlled ?-Current medications: ?Trulicity 1.5.4YHnto the skin Appropriate, Query effective, ,  ?Humulin 70/30 40 units with breakfast and 8 units with supper Appropriate, Effective, Safe, Accessible ?Gabapentin 60055m qam and 2hs ?-Medications previously tried: none noted  ?-Current home glucose readings ?fasting glucose: 140 today ?post prandial glucose: not checking regularly ?-Denies hypoglycemic/hyperglycemic symptoms ?-Current meal patterns:  ?breakfast: cereal, rice chex, eggs/turkey sausage  ?lunch: deli meat, vegetables, pickled beets, skinny pop, 90 calorie rice krispie treat  ?dinner: vegetables, meat, salmon/chicken, some red meat ?snacks: yogurt bar, apples ?drinks: occasional soda, water ?-Current exercise: 6500 steps per day ?-Educated on A1c and blood sugar goals; ?Complications of diabetes including kidney damage, retinal damage, and cardiovascular disease; ?Prevention and management of hypoglycemic episodes; ?  Benefits of routine self-monitoring of blood sugar; ?-Counseled to check feet daily and get yearly eye exams ?-Recommended to continue current medication ?Patient has celiac so managing diet has been complicated for her as well as tolerating increased doses of  Ozempic. ?Recommended she continue to work on diet, future plans cold be to add low dose metformin for better glucose control.  I would try not to increase Ozempic due to tolerability issues.  Consider Trulicity so we can help with copay assistance due to easier PAP program. ? ?Update 08/30/21 ?Trulicity just increased to 1.75m weekly.  She has not had a chance to start this dose yet.  Dealing with the pain she has not really been checking her glucose at home.  She reports she has not missed any doses of medication she just has no been keeping up with her monitoring.  Discussed implementing some exercise maybe in the water which could possible help with her pain. ?Reminded patient she is due for A1c recheck.  Work to control pain then start to focus back on controlling glucose. ?No further changes at this time. ? ?Celiac (Goal: Reduce symptoms) ?-Controlled ?-Current treatment  ?Dexilant 618mdaily ?Caragate 1gm/1035mrn ?-Medications previously tried: omeprazole  ?-Patient on very strict diet due to this - however diet as a whole seems pretty good. ?-Recommended to continue current medication ?Assessed patient finances. Dexilant copay high - will have patient assistance started for [employer].   ? ?Asthma (Goal: Prevent exacerbation) ?-Controlled ?-Current treatment  ?Albuterol HFA 21m41mrn ?-Medications previously tried: none noted ?-rarely has to use  ?-Recommended to continue current medication ?Denies any recent episodes of SOB. ? ?Depression?? (Goal: Reduce symptoms) ?-Not ideally controlled ?-Current treatment  ?None noted ?-Medications previously tried: Wellbutrin , Lexapro (nausea) ?PHQ9 SCORE ONLY 04/13/2021 02/24/2021 04/05/2020  ?PHQ-9 Total Score 10 0 8  ?-Patient had never gotten prescription filled, she still has it at home.  Believes she is still having some symptoms of depression. ?We discussed and patient was willing to try medication.  Discussed that it would take about 2 weeks for full effect to take  place.  Patient asked for a three week check in to determine efficacy of medication. ?Will have CMA call in 3 weeks to assess ? ?Update 08/30/21 ?She was not able to tolerate Lexapro.  It made her nauseous.  She reports she is in some pain in her back that is causing her a lot of stress currently.  She is also still taking care of her husband and he is "not getting any better."   ? ? ?  04/13/2021  ? 11:42 AM 02/24/2021  ?  2:33 PM 04/05/2020  ? 10:09 AM  ?PHQ9 SCORE ONLY  ?PHQ-9 Total Score 10 0 8  ? ?PHQ-9 indicated moderate depression.  I do feel like if she controlled pain it would improve.  She is very sensitive to a lot of medications. ?Work to control pain, she is expecting call about her MRI soon.  If depression still exists can consider other medication alternatives. ?No changes a this time. ? ?Patient Goals/Self-Care Activities ?Patient will:  ?- take medications as prescribed as evidenced by patient report and record review ?check glucose daily, document, and provide at future appointments ?check blood pressure a few times per week, document, and provide at future appointments ?target a minimum of 150 minutes of moderate intensity exercise weekly ? ?Follow Up Plan: The care management team will reach out to the patient again over the next 120 days.  ?  ? ?  ?  ? ?  The patient verbalized understanding of instructions, educational materials, and care plan provided today and declined offer to receive copy of patient instructions, educational materials, and care plan.  ?Telephone follow up appointment with pharmacy team member scheduled for: 6 months ? ?Edythe Clarity, [facility]  ?Beverly Milch, PharmD ?Clinical Pharmacist  ?[place] ?([phone] ? ?

## 2021-09-06 ENCOUNTER — Other Ambulatory Visit: Payer: Self-pay | Admitting: Family Medicine

## 2021-09-06 ENCOUNTER — Other Ambulatory Visit: Payer: Self-pay | Admitting: Endocrinology

## 2021-09-06 NOTE — Progress Notes (Signed)
?Charlann Boxer D.O. ?[facility] ?[place] ?Phone: [phone] ?Subjective:   ?I, [name], am serving as a Education administrator for Dr. Hulan Saas. ?This visit occurred during the SARS-CoV-2 public health emergency.  Safety protocols were in place, including screening questions prior to the visit, additional usage of staff PPE, and extensive cleaning of exam room while observing appropriate contact time as indicated for disinfecting solutions.  ? ?I'm seeing this patient by the request  of:  [name], MD ? ?CC: low back pain  ? ?[id number]  ?08/27/2021 ? ?Jessica [name] is a 71 y.o. female coming in with complaint of lumbar spine pain. Here to discuss MRI results. Patient states wants to know next steps. Injections did help but pain in the same location, but not as intense. Going down right side. ? ?MRI lumbar 08/27/2021 ?IMPRESSION: ?1. Multilevel lumbar spondylosis, most pronounced at L4-L5. ?2. Mild canal stenosis with moderate bilateral foraminal and ?subarticular recess stenosis at L4-L5. ?3. Severe left foraminal stenosis at L5-S1. ?4. Mild canal in bilateral foraminal stenosis at L3-4. ? ?MRI pelvis 08/27/2021 ?IMPRESSION: ?1. Mild osteoarthritis of the bilateral hips. ?2. Mild bone marrow edema within the superior left acetabulum, which ?may be reactive/degenerative. ?3. Tendinosis of the bilateral gluteus medius and minimus tendons ?without tear. ? ?  ? ?Past Medical History:  ?Diagnosis Date  ? Anxiety   ? 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 Asthma   ? childhood  ? Autonomic neuropathy   ? diabetic  ? Cataract   ? Celiac disease   ? Constipation   ? Depression   ? Diabetes mellitus   ? type II, Hemoglobin A1C 9.9 10/05/2011  ? Diabetic neuropathy ([place])   ? Diverticulosis   ? Fatty liver   ? Gastroparesis   ? GERD (gastroesophageal reflux disease)   ? Hyperlipidemia   ? Hypertension   ? IBS (irritable bowel syndrome)   ? Kidney stones   ? Personal history of colonic  polyps 03/2010  ? hyperplastic  ? Shingles 2021  ? ?Past Surgical History:  ?Procedure Laterality Date  ? APPENDECTOMY    ? CATARACT EXTRACTION Right 04/29/2018  ? CATARACT EXTRACTION Left 03/2018  ? COLONOSCOPY    ? DILATATION & CURRETTAGE/HYSTEROSCOPY WITH RESECTOCOPE N/A 03/24/2013  ? Procedure: DILATATION & CURETTAGE, HYSTEROSCOPY WITH RESECTION;  Surgeon: Olga Millers, MD;  Location: [facility];  Service: Gynecology;  Laterality: N/A;  ? HYSTEROSCOPY WITH D & C N/A 11/08/2015  ? Procedure: DILATATION AND CURETTAGE /HYSTEROSCOPY;  Surgeon: Olga Millers, MD;  Location: [facility];  Service: Gynecology;  Laterality: N/A;  ? left breast cyst removal  2000  ? LEFT HEART CATH AND CORONARY ANGIOGRAPHY N/A 10/31/2018  ? Procedure: LEFT HEART CATH AND CORONARY ANGIOGRAPHY;  Surgeon: Jettie Booze, MD;  Location: [facility];  Service: Cardiovascular;  Laterality: N/A;  ? TUBAL LIGATION    ? ?Social History  ? ?Socioeconomic History  ? Marital status: Married  ?  Spouse name: Devar Tonnesen  ? Number of children: 2  ? Years of education: Not on file  ? Highest education level: Not on file  ?Occupational History  ? Occupation: Scientist, water quality at [employer]  ?  Employer: RETIRED  ?  Comment: parttime  ? Occupation: retired  ?Tobacco Use  ? Smoking status: Never  ? Smokeless tobacco: Never  ?Vaping Use  ? Vaping Use: Never used  ?Substance and Sexual Activity  ? Alcohol use:  No  ? Drug use: No  ? Sexual activity: Yes  ?  Birth control/protection: Post-menopausal, Surgical  ?Other Topics Concern  ? Not on file  ?Social History Narrative  ? Not on file  ? ?Social Determinants of Health  ? ?Financial Resource Strain: Low Risk   ? Difficulty of Paying Living Expenses: Not hard at all  ?Food Insecurity: No Food Insecurity  ? Worried About Charity fundraiser in the Last Year: Never true  ? Ran Out of Food in the Last Year: Never true  ?Transportation Needs: No Transportation Needs  ? Lack of Transportation (Medical):  No  ? Lack of Transportation (Non-Medical): No  ?Physical Activity: Sufficiently Active  ? Days of Exercise per Week: 7 days  ? Minutes of Exercise per Session: 60 min  ?Stress: Stress Concern Present  ? Feeling of Stress : To some extent  ?Social Connections: Moderately Integrated  ? Frequency of Communication with Friends and Family: More than three times a week  ? Frequency of Social Gatherings with Friends and Family: Once a week  ? Attends Religious Services: More than 4 times per year  ? Active Member of Clubs or Organizations: No  ? Attends Archivist Meetings: Never  ? Marital Status: Married  ? ?Allergies  ?Allergen Reactions  ? Gluten Meal Other (See Comments)  ?  No wheat or soy  ? Nsaids Nausea And Vomiting and Other (See Comments)  ?  Cannot tolerate large doses   ? Soy Allergy   ? Wheat Bran   ? ?Family History  ?Problem Relation Age of Onset  ? Hypertension Mother   ? Colon cancer Sister   ?     dx in her early 48s  ? Diabetes Sister   ? Endometrial cancer Sister   ? Hypertension Brother   ? Other Father   ?     2 collapsed lungs  ? COPD Father   ? Diabetes Father   ? Heart attack Father   ? Heart failure Father   ? High blood pressure Father   ? Colon polyps Neg Hx   ? Esophageal cancer Neg Hx   ? Stomach cancer Neg Hx   ? ? ?Current Outpatient Medications (Endocrine & Metabolic):  ?  Dulaglutide (TRULICITY) 1.5 YI/9.4WN SOPN, Inject 1.5 mg into the skin once a week. ?  insulin NPH-regular Human (HUMULIN 70/30) (70-30) 100 UNIT/ML injection, INJECT 38 UNITS WITH BREAKFAST, AND 10 UNITS WITH SUPPER. ? ?Current Outpatient Medications (Cardiovascular):  ?  atorvastatin (LIPITOR) 40 MG tablet, TAKE 1 TABLET BY MOUTH EVERY DAY ?  furosemide (LASIX) 20 MG tablet, TAKE 1 TABLET (20 MG TOTAL) BY MOUTH DAILY AS NEEDED FOR FLUID OR EDEMA. ?  olmesartan (BENICAR) 40 MG tablet, TAKE 1 TABLET BY MOUTH EVERY DAY ? ?Current Outpatient Medications (Respiratory):  ?  albuterol (VENTOLIN HFA) 108 (90 Base)  MCG/ACT inhaler, Inhale 2 puffs into the lungs every 6 (six) hours as needed for wheezing or shortness of breath. ?  fluticasone (FLONASE) 50 MCG/ACT nasal spray, Place 1 spray into both nostrils daily. ? ?Current Outpatient Medications (Analgesics):  ?  aspirin EC 81 MG tablet, Take 81 mg by mouth daily. Swallow whole. ?  ibuprofen (ADVIL) 200 MG tablet, Take 200 mg by mouth daily. ? ? ?Current Outpatient Medications (Other):  ?  BD INSULIN SYRINGE U/F 31G X 5/16" 1 ML MISC, USE UP TO 3 TIMES A DAY ?  dexlansoprazole (DEXILANT) 60 MG capsule, TAKE 1 CAPSULE  BY MOUTH EVERY DAY ?  diazepam (VALIUM) 5 MG tablet, One tab by mouth, 2 hours before procedure. ?  gabapentin (NEURONTIN) 600 MG tablet, TAKE 1 TABLET IN THE MORNING AND 2 TABLETS AT BEDTIME ?  ketoconazole (NIZORAL) 2 % cream, APPLY TWICE A DAY FOR 2 WEEKS AS NEEDED FOR RASH ?  ondansetron (ZOFRAN ODT) 4 MG disintegrating tablet, Take 1 tablet (4 mg total) by mouth every 8 (eight) hours as needed for nausea or vomiting. ?  ONETOUCH ULTRA test strip, USE 2 TIMES DAILY. AND LANCETS 2/DAY ?  polyethylene glycol powder ([employer] PURELAX) 17 GM/SCOOP powder, DISSOLVE 1 CAPFUL IN AT LEAST 8 OUNCES WATER/JUICE AND DRINK TWICE DAILY ?  sucralfate (CARAFATE) 1 GM/10ML suspension, Take 10 mLs (1 g total) by mouth 4 (four) times daily. ?  valACYclovir (VALTREX) 500 MG tablet, TAKE 1 TABLET BY MOUTH EVERY DAY ?  Vitamin D, Ergocalciferol, (DRISDOL) 1.25 MG (50000 UNIT) CAPS capsule, Take 1 capsule (50,000 Units total) by mouth every 7 (seven) days. ? ? ?Reviewed prior external information including notes and imaging from  ?primary care provider ?As well as notes that were available from care everywhere and other healthcare systems. ? ?Past medical history, social, surgical and family history all reviewed in electronic medical record.  No pertanent information unless stated regarding to the chief complaint.  ? ?Review of Systems: ? No headache, visual changes, nausea, vomiting,  diarrhea, constipation, dizziness, abdominal pain, skin rash, fevers, chills, night sweats, weight loss, swollen lymph nodes, body aches, joint swelling, chest pain, shortness of breath, mood changes. PO

## 2021-09-07 ENCOUNTER — Ambulatory Visit (INDEPENDENT_AMBULATORY_CARE_PROVIDER_SITE_OTHER): Payer: Medicare Other | Admitting: Family Medicine

## 2021-09-07 ENCOUNTER — Encounter: Payer: Self-pay | Admitting: Family Medicine

## 2021-09-07 VITALS — BP 124/78 | HR 73 | Ht 65.0 in | Wt 209.0 lb

## 2021-09-07 DIAGNOSIS — M5441 Lumbago with sciatica, right side: Secondary | ICD-10-CM

## 2021-09-07 DIAGNOSIS — M5416 Radiculopathy, lumbar region: Secondary | ICD-10-CM | POA: Diagnosis not present

## 2021-09-07 DIAGNOSIS — G8929 Other chronic pain: Secondary | ICD-10-CM | POA: Diagnosis not present

## 2021-09-07 MED ORDER — KETOROLAC TROMETHAMINE 30 MG/ML IJ SOLN
30.0000 mg | Freq: Once | INTRAMUSCULAR | Status: AC
  Administered 2021-09-07: 30 mg via INTRAMUSCULAR

## 2021-09-07 NOTE — Assessment & Plan Note (Signed)
Patient does have MRI of the back shows the patient does have multilevel spondylosis but I do think it seems to be more severe in the L4-L5 area.  Patient does have what appears to be more radicular symptoms in the L4-L5 right side radiculopathy that could be corresponding to some of the leg pain patient has had.  Patient's MRI of the ankle was fairly unremarkable, we discussed possible peripheral neuropathy with patient's diabetes is well but being a possibility.  I do would like to consider the possibility of epidural or nerve root injections for diagnostic as well as potentially therapeutic purposes.  Follow-up with me again 6 weeks after the injections to see how patient is responding.  Injection of Toradol given today as well which has helped patient in the past. ?

## 2021-09-07 NOTE — Patient Instructions (Addendum)
Good to see you!  ?[place] [phone] ?Call Today ? ?See you again in 6-8 weeks to check in on ankle and back ?

## 2021-09-09 ENCOUNTER — Ambulatory Visit: Payer: Medicare Other | Admitting: Family Medicine

## 2021-09-15 ENCOUNTER — Ambulatory Visit: Payer: Medicare Other | Admitting: Podiatry

## 2021-09-16 ENCOUNTER — Other Ambulatory Visit: Payer: Self-pay | Admitting: Physician Assistant

## 2021-09-19 ENCOUNTER — Encounter: Payer: Self-pay | Admitting: Internal Medicine

## 2021-09-20 MED ORDER — POLYETHYLENE GLYCOL 3350 17 GM/SCOOP PO POWD: ORAL | 5 refills | Status: DC

## 2021-09-22 ENCOUNTER — Ambulatory Visit (INDEPENDENT_AMBULATORY_CARE_PROVIDER_SITE_OTHER): Payer: Medicare Other | Admitting: Bariatrics

## 2021-09-22 ENCOUNTER — Encounter (INDEPENDENT_AMBULATORY_CARE_PROVIDER_SITE_OTHER): Payer: Self-pay | Admitting: Bariatrics

## 2021-09-22 VITALS — BP 130/63 | HR 77 | Temp 98.0°F | Ht 65.0 in | Wt 201.0 lb

## 2021-09-22 DIAGNOSIS — E669 Obesity, unspecified: Secondary | ICD-10-CM | POA: Diagnosis not present

## 2021-09-22 DIAGNOSIS — Z6833 Body mass index (BMI) 33.0-33.9, adult: Secondary | ICD-10-CM

## 2021-09-22 DIAGNOSIS — Z7985 Long-term (current) use of injectable non-insulin antidiabetic drugs: Secondary | ICD-10-CM

## 2021-09-22 DIAGNOSIS — E1169 Type 2 diabetes mellitus with other specified complication: Secondary | ICD-10-CM | POA: Diagnosis not present

## 2021-09-22 DIAGNOSIS — E559 Vitamin D deficiency, unspecified: Secondary | ICD-10-CM

## 2021-09-22 MED ORDER — ONDANSETRON HCL 4 MG PO TABS: 4.0000 mg | ORAL_TABLET | Freq: Three times a day (TID) | ORAL | 0 refills | Status: DC | PRN

## 2021-09-22 MED ORDER — TRULICITY 1.5 MG/0.5ML ~~LOC~~ SOAJ: 1.5000 mg | SUBCUTANEOUS | 0 refills | Status: DC

## 2021-09-22 MED ORDER — VITAMIN D (ERGOCALCIFEROL) 1.25 MG (50000 UNIT) PO CAPS: 50000.0000 [IU] | ORAL_CAPSULE | ORAL | 0 refills | Status: DC

## 2021-09-23 ENCOUNTER — Ambulatory Visit
Admission: RE | Admit: 2021-09-23 | Discharge: 2021-09-23 | Disposition: A | Discharge status: Home / Self Care | Payer: Medicare Other | Source: Ambulatory Visit | Attending: Family Medicine | Admitting: Family Medicine

## 2021-09-23 DIAGNOSIS — G8929 Other chronic pain: Secondary | ICD-10-CM

## 2021-09-23 MED ORDER — METHYLPREDNISOLONE ACETATE 40 MG/ML INJ SUSP (RADIOLOG
80.0000 mg | Freq: Once | INTRAMUSCULAR | Status: AC
  Administered 2021-09-23: 80 mg via EPIDURAL

## 2021-09-23 MED ORDER — IOPAMIDOL (ISOVUE-M 200) INJECTION 41%
1.0000 mL | Freq: Once | INTRAMUSCULAR | Status: AC
  Administered 2021-09-23: 1 mL via EPIDURAL

## 2021-09-23 NOTE — Discharge Instructions (Signed)

## 2021-09-28 ENCOUNTER — Encounter (INDEPENDENT_AMBULATORY_CARE_PROVIDER_SITE_OTHER): Payer: Self-pay | Admitting: Bariatrics

## 2021-09-28 DIAGNOSIS — Z794 Long term (current) use of insulin: Secondary | ICD-10-CM

## 2021-09-28 DIAGNOSIS — E1169 Type 2 diabetes mellitus with other specified complication: Secondary | ICD-10-CM

## 2021-09-28 DIAGNOSIS — E785 Hyperlipidemia, unspecified: Secondary | ICD-10-CM

## 2021-09-28 DIAGNOSIS — J45909 Unspecified asthma, uncomplicated: Secondary | ICD-10-CM | POA: Diagnosis not present

## 2021-09-28 DIAGNOSIS — E1140 Type 2 diabetes mellitus with diabetic neuropathy, unspecified: Secondary | ICD-10-CM

## 2021-09-28 DIAGNOSIS — I10 Essential (primary) hypertension: Secondary | ICD-10-CM

## 2021-09-28 NOTE — Progress Notes (Signed)
Chief Complaint:   OBESITY Jessica [name] is here to discuss her progress with her obesity treatment plan along with follow-up of her obesity related diagnoses. Jessica [name] is on the Category 3 Plan and states she is following her eating plan approximately 90% of the time. Jessica [name] states she is doing 0 minutes 0 times per week.  Today's visit was #: 30 Starting weight: 246 lbs Starting date: 03/12/2019 Today's weight: 201 lbs Today's date: 09/22/2021 Total lbs lost to date: 45 lbs Total lbs lost since last in-office visit: 3 lbs  Interim History: Jessica [name] is down additional 3 lbs since her last visit.   Subjective:   1. Type 2 diabetes mellitus with obesity ([place]) Jessica [name] notes some nausea with Trulicity. She is taking her medications as directed.   2. Vitamin D deficiency Jessica [name] is taking her medications as directed.   Assessment/Plan:   1. Type 2 diabetes mellitus with obesity (HCC) We will refill Trulicity 1.5 mg for 1 month with no refills. We will refill Zofran 4 mg as needed for nausea every 8 hours with no refills. Good blood sugar control is important to decrease the likelihood of diabetic complications such as nephropathy, neuropathy, limb loss, blindness, coronary artery disease, and death. Intensive lifestyle modification including diet, exercise and weight loss are the first line of treatment for diabetes.   - Dulaglutide (TRULICITY) 1.5 TG/5.4DI SOPN; Inject 1.5 mg into the skin once a week.  Dispense: 2 mL; Refill: 0 - ondansetron (ZOFRAN) 4 MG tablet; Take 1 tablet (4 mg total) by mouth every 8 (eight) hours as needed for nausea or vomiting.  Dispense: 20 tablet; Refill: 0  2. Vitamin D deficiency Low Vitamin D level contributes to fatigue and are associated with obesity, breast, and colon cancer. We will refill prescription Vitamin D 50,000 IU every week for 1 month with no refills and Jessica [name] will follow-up for routine testing of Vitamin D, at least 2-3 times per year to avoid  over-replacement.  - Vitamin D, Ergocalciferol, (DRISDOL) 1.25 MG (50000 UNIT) CAPS capsule; Take 1 capsule (50,000 Units total) by mouth every 7 (seven) days.  Dispense: 4 capsule; Refill: 0  3. Obesity, Current BMI 33.6 Jessica [name] is currently in the action stage of change. As such, her goal is to continue with weight loss efforts. She has agreed to the Category 3 Plan.   Jessica [name] will continue meal planning and she will continue intentional eating. [place] Sheet was provided today. She will keep her water intake high.   Exercise goals:  Jessica [name] notes minimal exercise.   Behavioral modification strategies: increasing lean protein intake, decreasing simple carbohydrates, increasing vegetables, increasing water intake, decreasing eating out, no skipping meals, meal planning and cooking strategies, keeping healthy foods in the home, and planning for success.  Jessica [name] has agreed to follow-up with our clinic in 3-4 weeks. She was informed of the importance of frequent follow-up visits to maximize her success with intensive lifestyle modifications for her multiple health conditions.   Objective:   Blood pressure 130/63, pulse 77, temperature 98 F (36.7 C), height 5' 5"  (1.651 m), weight 201 lb (91.2 kg), SpO2 97 %. Body mass index is 33.45 kg/m.  General: Cooperative, alert, well developed, in no acute distress. HEENT: Conjunctivae and lids unremarkable. Cardiovascular: Regular rhythm.  Lungs: Normal work of breathing. Neurologic: No focal deficits.   Lab Results  Component Value Date   CREATININE 0.95 06/09/2021   BUN 23 06/09/2021   NA 138 06/09/2021  K 4.6 06/09/2021   CL 102 06/09/2021   CO2 33 (H) 06/09/2021   Lab Results  Component Value Date   ALT 14 06/09/2021   AST 16 06/09/2021   ALKPHOS 69 06/09/2021   BILITOT 0.7 06/09/2021   Lab Results  Component Value Date   HGBA1C 7.7 (A) 04/13/2021   HGBA1C 8.2 (A) 01/12/2021   HGBA1C 8.1 (A) 08/12/2020   HGBA1C 7.8 (A)  05/14/2020   HGBA1C 7.1 (A) 12/31/2019   No results found for: INSULIN Lab Results  Component Value Date   TSH 0.91 04/13/2021   Lab Results  Component Value Date   CHOL 135 04/13/2021   HDL 59.70 04/13/2021   LDLCALC 65 04/13/2021   LDLDIRECT 143.2 11/23/2006   TRIG 50.0 04/13/2021   CHOLHDL 2 04/13/2021   Lab Results  Component Value Date   VD25OH 44.70 04/13/2021   VD25OH 85.0 09/07/2020   VD25OH 61.6 03/11/2020   Lab Results  Component Value Date   WBC 3.4 (L) 06/09/2021   HGB 14.7 06/09/2021   HCT 43.7 06/09/2021   MCV 99.0 06/09/2021   PLT 214.0 06/09/2021   Lab Results  Component Value Date   IRON 113 09/07/2020   TIBC 276 09/07/2020   FERRITIN 149 09/07/2020   Attestation Statements:   Reviewed by clinician on day of visit: allergies, medications, problem list, medical history, surgical history, family history, social history, and previous encounter notes.  I, Lizbeth Bark, RMA, am acting as Location manager for [employer], DO.  I have reviewed the above documentation for accuracy and completeness, and I agree with the above. [name], DO

## 2021-09-30 ENCOUNTER — Telehealth: Payer: Self-pay | Admitting: Pharmacist

## 2021-09-30 NOTE — Progress Notes (Signed)
Chronic Care Management Pharmacy Assistant   Name: Jessica [name]  MRN: [id number] DOB: 1950-07-29   Reason for Encounter: Diabetes Adherence Call    Recent office visits:  None since last CPP visit  Recent consult visits:  09/22/2021 OV ([facility]) Jessica Lesch A, DO; no medication changes indicated.  09/07/2021 OV ([facility]) Jessica Pulley, DO; no medication changes indicated.  Hospital visits:  None since last CPP visit  Medications: Outpatient Encounter Medications as of 09/30/2021  Medication Sig   albuterol (VENTOLIN HFA) 108 (90 Base) MCG/ACT inhaler Inhale 2 puffs into the lungs every 6 (six) hours as needed for wheezing or shortness of breath.   aspirin EC 81 MG tablet Take 81 mg by mouth daily. Swallow whole.   atorvastatin (LIPITOR) 40 MG tablet TAKE 1 TABLET BY MOUTH EVERY DAY   BD INSULIN SYRINGE U/F 31G X 5/16" 1 ML MISC USE UP TO 3 TIMES [name] DAY   dexlansoprazole (DEXILANT) 60 MG capsule TAKE 1 CAPSULE BY MOUTH EVERY DAY   diazepam (VALIUM) 5 MG tablet One tab by mouth, 2 hours before procedure.   Dulaglutide (TRULICITY) 1.5 LP/3.7TK SOPN Inject 1.5 mg into the skin once [name] week.   escitalopram (LEXAPRO) 10 MG tablet TAKE 1 TABLET BY MOUTH DAILY.   fluticasone (FLONASE) 50 MCG/ACT nasal spray Place 1 spray into both nostrils daily.   furosemide (LASIX) 20 MG tablet TAKE 1 TABLET (20 MG TOTAL) BY MOUTH DAILY AS NEEDED FOR FLUID OR EDEMA.   gabapentin (NEURONTIN) 600 MG tablet TAKE 1 TABLET IN THE MORNING AND 2 TABLETS AT BEDTIME   ibuprofen (ADVIL) 200 MG tablet Take 200 mg by mouth daily.   insulin NPH-regular Human (HUMULIN 70/30) (70-30) 100 UNIT/ML injection INJECT 38 UNITS WITH BREAKFAST, AND 10 UNITS WITH SUPPER.   ketoconazole (NIZORAL) 2 % cream APPLY TWICE [name] DAY FOR 2 WEEKS AS NEEDED FOR RASH   olmesartan (BENICAR) 40 MG tablet TAKE 1 TABLET BY MOUTH EVERY DAY   ondansetron (ZOFRAN ODT) 4 MG disintegrating tablet Take 1 tablet (4 mg total) by mouth  every 8 (eight) hours as needed for nausea or vomiting.   ondansetron (ZOFRAN) 4 MG tablet Take 1 tablet (4 mg total) by mouth every 8 (eight) hours as needed for nausea or vomiting.   ONETOUCH ULTRA test strip USE 2 TIMES DAILY. AND LANCETS 2/DAY   polyethylene glycol powder ([employer] PURELAX) 17 GM/SCOOP powder DISSOLVE 1 CAPFUL IN AT LEAST 8 OUNCES WATER/JUICE AND DRINK TWICE DAILY   sucralfate (CARAFATE) 1 GM/10ML suspension Take 10 mLs (1 g total) by mouth 4 (four) times daily.   valACYclovir (VALTREX) 500 MG tablet TAKE 1 TABLET BY MOUTH EVERY DAY   Vitamin D, Ergocalciferol, (DRISDOL) 1.25 MG (50000 UNIT) CAPS capsule Take 1 capsule (50,000 Units total) by mouth every 7 (seven) days.   No facility-administered encounter medications on file as of 09/30/2021.   Recent Relevant Labs: Lab Results  Component Value Date/Time   HGBA1C 7.7 ([name]) 04/13/2021 11:20 AM   HGBA1C 8.2 ([name]) 01/12/2021 10:28 AM   HGBA1C 6.7 (H) 07/24/2019 04:30 PM   HGBA1C 7.1 (H) 07/17/2017 04:13 PM   MICROALBUR 0.7 10/25/2017 02:14 PM   MICROALBUR 0.7 10/02/2016 03:21 PM    Kidney Function Lab Results  Component Value Date/Time   CREATININE 0.95 06/09/2021 10:10 AM   CREATININE 0.98 04/13/2021 11:49 AM   CREATININE 0.83 04/04/2012 08:32 AM   GFR 60.62 06/09/2021 10:10 AM   GFRNONAA 61 03/11/2020  10:38 AM   GFRAA 70 03/11/2020 10:38 AM    Current antihyperglycemic regimen:  Trulicity 1.5 mg once [name] week Humulin 70/30 38 units with breakfast, 10 with supper  What recent interventions/DTPs have been made to improve glycemic control:  No recent interventions or DTPs.  Have there been any recent hospitalizations or ED visits since last visit with CPP? No  Patient denies hypoglycemic symptoms.  Patient denies hyperglycemic symptoms.  How often are you checking your blood sugar? twice daily  What are your blood sugars ranging?  Fasting: 186  During the week, how often does your blood glucose drop below 70?  Never  Are you checking your feet daily/regularly? Yes  Adherence Review: Is the patient currently on [name] STATIN medication? Yes Is the patient currently on ACE/ARB medication? Yes Does the patient have >5 day gap between last estimated fill dates? No  ~2 weeks ago patient states she had [name] steroid injection in her back that has caused her sugar levels to be elevated. Patient states she still has some pain in her back. She states she is doing okay for now.  Care Gaps: Medicare Annual Wellness: Completed 02/24/2021 Ophthalmology Exam: Next due on 04/07/2022 Foot Exam: Next due on 10/01/2021 Hemoglobin A1C: 7.7% on 04/13/2021 Colonoscopy: Next due on 10/13/2024 Dexa Scan: Next due on 10/25/2025 Mammogram: Next due on 07/23/2022  Future Appointments  Date Time Provider [place]  10/11/2021 10:15 AM [place], Bennie Pierini T, DPM [facility]  10/19/2021  1:30 PM [name], MD [facility]  10/20/2021 12:20 PM Georgia Lopes, DO [facility] None  10/27/2021  1:00 PM Jessica Pulley, DO [facility] None  03/13/2022  1:45 PM [facility] HEALTH COACH [facility]  03/14/2022  3:45 PM [facility] CCM PHARMACIST [facility]   Star Rating Drugs: Olmesartan 40 mg last filled 7/68/1157 90 DS Trulicity 2.6OM/3.5 mL last filled 09/22/2021 28 DS Atorvastatin 40 mg last filled 09/21/2021 90 DS  Jessica [name], [place] Pharmacist Assistant [phone]

## 2021-10-11 ENCOUNTER — Ambulatory Visit: Payer: Medicare Other | Admitting: Podiatry

## 2021-10-19 ENCOUNTER — Encounter: Payer: Self-pay | Admitting: Family Medicine

## 2021-10-19 ENCOUNTER — Ambulatory Visit (INDEPENDENT_AMBULATORY_CARE_PROVIDER_SITE_OTHER): Payer: Medicare Other | Admitting: Family Medicine

## 2021-10-19 ENCOUNTER — Other Ambulatory Visit (INDEPENDENT_AMBULATORY_CARE_PROVIDER_SITE_OTHER): Payer: Self-pay | Admitting: Bariatrics

## 2021-10-19 VITALS — BP 120/60 | HR 81 | Temp 98.6°F | Ht 65.0 in | Wt 206.2 lb

## 2021-10-19 DIAGNOSIS — E1142 Type 2 diabetes mellitus with diabetic polyneuropathy: Secondary | ICD-10-CM | POA: Diagnosis not present

## 2021-10-19 DIAGNOSIS — I10 Essential (primary) hypertension: Secondary | ICD-10-CM

## 2021-10-19 DIAGNOSIS — E1169 Type 2 diabetes mellitus with other specified complication: Secondary | ICD-10-CM

## 2021-10-19 DIAGNOSIS — R252 Cramp and spasm: Secondary | ICD-10-CM

## 2021-10-19 DIAGNOSIS — E669 Obesity, unspecified: Secondary | ICD-10-CM

## 2021-10-19 DIAGNOSIS — E559 Vitamin D deficiency, unspecified: Secondary | ICD-10-CM

## 2021-10-19 DIAGNOSIS — F4321 Adjustment disorder with depressed mood: Secondary | ICD-10-CM | POA: Diagnosis not present

## 2021-10-19 DIAGNOSIS — J320 Chronic maxillary sinusitis: Secondary | ICD-10-CM

## 2021-10-19 LAB — MICROALBUMIN / CREATININE URINE RATIO
Creatinine,U: 86.6 mg/dL
Microalb Creat Ratio: 0.8 mg/g (ref 0.0–30.0)
Microalb, Ur: <0.7 mg/dL (ref 0.0–1.9)

## 2021-10-19 LAB — POCT GLYCOSYLATED HEMOGLOBIN (HGB A1C): Hemoglobin A1C: 6.3 % — AB (ref 4.0–5.6)

## 2021-10-19 MED ORDER — AMOXICILLIN-POT CLAVULANATE 875-125 MG PO TABS: 1.0000 | ORAL_TABLET | Freq: Two times a day (BID) | ORAL | 0 refills | Status: AC

## 2021-10-19 MED ORDER — FLUTICASONE PROPIONATE 50 MCG/ACT NA SUSP: 1.0000 | Freq: Every day | NASAL | 6 refills | Status: DC

## 2021-10-19 NOTE — Progress Notes (Signed)
Subjective  CC:  Chief Complaint  Patient presents with   Depression    Pt here to F/U with mood disorder and DM   Diabetes   Hypertension    HPI: Jessica [name] is a 71 y.o. female who presents to the office today for follow up of diabetes and problems listed above in the chief complaint.  Diabetes follow up: Her diabetic control is reported as Improved. On trulicity per healthy weight and wellness x 2 months. Makes her nauseas and doesn't feel well on it but is compliant. And to establish with new endocrinologist soon  She denies exertional CP or SOB or symptomatic hypoglycemia. She denies foot sores or paresthesias.  C/o leg cramps. Mostly at night. Has been more sedentary due to chronic back and foot pain managed by ortho and sm. Blood pressure is ding well.  Mood and fatigue: still tired but multifactorial: pain and med side effects and mood. Couldn't tolerate the lexapro due to nausea. No panic sxs C/o sxs of sinus infection ongoing x 1 month: dental pain, thick mucoid d/c, and congestion w/o fever or cough.   Wt Readings from Last 3 Encounters:  10/19/21 206 lb 3.2 oz (93.5 kg)  09/22/21 201 lb (91.2 kg)  09/07/21 209 lb (94.8 kg)    BP Readings from Last 3 Encounters:  10/19/21 120/60  09/23/21 121/63  09/22/21 130/63    Assessment  1. Type 2 diabetes mellitus with obesity ([place])   2. Peripheral sensory neuropathy due to type 2 diabetes mellitus ([place])   3. Essential hypertension   4. Acute adjustment disorder with depressed mood   5. Muscle cramps   6. Chronic maxillary sinusitis      Plan  Diabetes is currently well controlled. Continue insulin, trulicity and f/u with endocrine HTN is controlled well Mood:reports managing behaviorally.  Stretching hydration and tonic water for mm cramps. Augmenting and flonase for sinus infection and allergy sxs  Follow up: 6 mo for cpe. Orders Placed This Encounter  Procedures   Microalbumin / creatinine urine ratio    POCT HgB A1C   Meds ordered this encounter  Medications   fluticasone (FLONASE) 50 MCG/ACT nasal spray    Sig: Place 1 spray into both nostrils daily.    Dispense:  16 g    Refill:  6   amoxicillin-clavulanate (AUGMENTIN) 875-125 MG tablet    Sig: Take 1 tablet by mouth 2 (two) times daily for 10 days.    Dispense:  20 tablet    Refill:  0      Immunization History  Administered Date(s) Administered   Fluad Quad(high Dose 65+) 01/16/2019, 01/20/2020   Influenza,inj,Quad PF,6+ Mos 02/14/2018   PFIZER Comirnaty(Gray Top)Covid-19 Tri-Sucrose Vaccine 09/02/2020   PFIZER(Purple Top)SARS-COV-2 Vaccination 07/03/2019, 08/06/2019   Pneumococcal Conjugate-13 11/03/2015   Pneumococcal Polysaccharide-23 08/17/2010, 02/14/2018   Td 12/31/2002   Tdap 10/25/2017   Zoster Recombinat (Shingrix) 11/05/2018, 01/28/2019    Diabetes Related Lab Review: Lab Results  Component Value Date   HGBA1C 6.3 (A) 10/19/2021   HGBA1C 7.7 (A) 04/13/2021   HGBA1C 8.2 (A) 01/12/2021    Lab Results  Component Value Date   MICROALBUR 0.7 10/25/2017   Lab Results  Component Value Date   CREATININE 0.95 06/09/2021   BUN 23 06/09/2021   NA 138 06/09/2021   K 4.6 06/09/2021   CL 102 06/09/2021   CO2 33 (H) 06/09/2021   Lab Results  Component Value Date   CHOL 135 04/13/2021  CHOL 131 10/07/2020   CHOL 145 03/11/2020   Lab Results  Component Value Date   HDL 59.70 04/13/2021   HDL 52.50 10/07/2020   HDL 58 03/11/2020   Lab Results  Component Value Date   LDLCALC 65 04/13/2021   LDLCALC 58 10/07/2020   LDLCALC 74 03/11/2020   Lab Results  Component Value Date   TRIG 50.0 04/13/2021   TRIG 101.0 10/07/2020   TRIG 62 03/11/2020   Lab Results  Component Value Date   CHOLHDL 2 04/13/2021   CHOLHDL 2 10/07/2020   CHOLHDL 3 10/16/2018   Lab Results  Component Value Date   LDLDIRECT 143.2 11/23/2006   The 10-year ASCVD risk score (Arnett DK, et al., 2019) is: 18.1%   Values used to  calculate the score:     Age: 40 years     Sex: Female     Is Non-Hispanic African American: Yes     Diabetic: Yes     Tobacco smoker: No     Systolic Blood Pressure: 163 mmHg     Is BP treated: Yes     HDL Cholesterol: 59.7 mg/dL     Total Cholesterol: 135 mg/dL I have reviewed the PMH, Fam and Soc history. 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[place]) 04/05/2020    Priority: High   Celiac disease 02/14/2018    Priority: High   Diabetic neuropathy associated with type 2 diabetes mellitus ([place]) 02/14/2018    Priority: High   Class 2 obesity due to excess calories with body mass index (BMI) of 37.0 to 37.9 in adult 02/14/2018    Priority: High   Type 2 diabetes mellitus with complication, with long-term current use of insulin ([place]) 09/23/2010    Priority: High   Combined hyperlipidemia associated with type 2 diabetes mellitus ([place]) 01/29/2009    Priority: High   Essential hypertension 10/22/2006    Priority: High    Qualifier: Diagnosis of  By: Marca Ancona RMA, Lucy      Osteopenia 02/14/2018    Priority: Medium     dexa 2019    Mild intermittent asthma 02/14/2018    Priority: Medium    Degenerative arthritis of knee, bilateral 10/29/2015    Priority: Medium     Bilateral steroid injections 12/02/2015 Started Orthovisc 12/16/2015 Monovisc given 04/07/2020    Degenerative cervical disc 07/27/2015    Priority: Medium    OSA (obstructive sleep apnea) 02/28/2011    Priority: Medium    Constipation, slow transit 09/23/2010    Priority: Medium    FH: colon cancer 09/23/2010    Priority: Medium    Personal history of colonic polyps 09/23/2010    Priority: Medium    Diabetic gastroparesis ([place]) 02/27/2007    Priority: Medium    Diabetic autonomic neuropathy associated with type 2 diabetes mellitus ([place]) 10/22/2006    Priority: Medium    Bilateral lower extremity edema 01/20/2020    Priority: Low    Fibroma of tongue 06/06/2016    Priority: Low   Renal calculi 01/13/2016    Priority: Low   Lumbar radiculopathy 08/25/2021   Problem related to unspecified psychosocial circumstances 07/05/2021   Localized swelling of right lower extremity 06/07/2021   Increased body mass index 06/01/2021   Thyroid nodule 10/28/2019   Abnormal stress test    Family history of malignant neoplasm of endometrium 10/08/2018   Trigger point of right shoulder region 08/02/2015  Injected 08/02/2015 injections given March 10, 2021     Social History: Patient  reports that she has never smoked. She has never used smokeless tobacco. She reports that she does not drink alcohol and does not use drugs.  Review of Systems: Ophthalmic: negative for eye pain, loss of vision or double vision Cardiovascular: negative for chest pain Respiratory: negative for SOB or persistent cough Gastrointestinal: negative for abdominal pain Genitourinary: negative for dysuria or gross hematuria MSK: negative for foot lesions Neurologic: negative for weakness or gait disturbance  Objective  Vitals: BP 120/60   Pulse 81   Temp 98.6 F (37 C)   Ht 5' 5"  (1.651 m)   Wt 206 lb 3.2 oz (93.5 kg)   SpO2 95%   BMI 34.31 kg/m  General: well appearing, no acute distress  Psych:  Alert and oriented, normal mood and affect HEENT:  Normocephalic, atraumatic, moist mucous membranes, supple neck  Cardiovascular:  Nl S1 and S2, RRR without murmur, gallop or rub. no edema Respiratory:  Good breath sounds bilaterally, CTAB with normal effort, no rales Gastrointestinal: normal BS, soft, nontender Skin:  Warm, no rashes Neurologic:   Mental status is normal. normal gait Foot exam: no erythema, pallor, or cyanosis visible nl proprioception and sensation to monofilament testing bilaterally, +2 distal pulses bilaterally    Diabetic education: ongoing education regarding chronic disease management for diabetes was given today. We  continue to reinforce the ABC's of diabetic management: A1c (<7 or 8 dependent upon patient), tight blood pressure control, and cholesterol management with goal LDL < 100 minimally. We discuss diet strategies, exercise recommendations, medication options and possible side effects. At each visit, we review recommended immunizations and preventive care recommendations for diabetics and stress that good diabetic control can prevent other problems. See below for this patient's data.   Commons side effects, risks, benefits, and alternatives for medications and treatment plan prescribed today were discussed, and the patient expressed understanding of the given instructions. Patient is instructed to call or message via MyChart if he/she has any questions or concerns regarding our treatment plan. No barriers to understanding were identified. We discussed Red Flag symptoms and signs in detail. Patient expressed understanding regarding what to do in case of urgent or emergency type symptoms.  Medication list was reconciled, printed and provided to the patient in AVS. Patient instructions and summary information was reviewed with the patient as documented in the AVS. This note was prepared with assistance of Dragon voice recognition software. Occasional wrong-word or sound-a-like substitutions may have occurred due to the inherent limitations of voice recognition software  This visit occurred during the SARS-CoV-2 public health emergency.  Safety protocols were in place, including screening questions prior to the visit, additional usage of staff PPE, and extensive cleaning of exam room while observing appropriate contact time as indicated for disinfecting solutions.

## 2021-10-19 NOTE — Patient Instructions (Signed)
Please return in 6 months for your annual complete physical; please come fasting.    If you have any questions or concerns, please don't hesitate to send me a message via MyChart or call the office at [phone]. Thank you for visiting with [place] today! It's our pleasure caring for you.   Leg Cramps Leg cramps occur when one or more muscles tighten and a person has no control over it (involuntary muscle contraction). Muscle cramps are most common in the calf muscles of the leg. They can occur during exercise or at rest. Leg cramps are painful, and they may last for a few seconds to a few minutes. Cramps may return several times before they finally stop. Usually, leg cramps are not caused by a serious medical problem. In many cases, the cause is not known. Some common causes include: Excessive physical effort (overexertion), such as during intense exercise. Doing the same motion over and over. Staying in a certain position for a long period of time. Improper preparation, form, or technique while doing a sport or an activity. Dehydration. Injury. Side effects of certain medicines. Abnormally low levels of minerals in your blood (electrolytes), especially potassium and calcium. This could result from: Pregnancy. Taking diuretic medicines. Follow these instructions at home: Eating and drinking Drink enough fluid to keep your urine pale yellow. Staying hydrated may help prevent cramps. Eat a healthy diet that includes plenty of nutrients to help your muscles function. A healthy diet includes fruits and vegetables, lean protein, whole grains, and low-fat or nonfat dairy products. Managing pain, stiffness, and swelling     Try massaging, stretching, and relaxing the affected muscle. Do this for several minutes at a time. If directed, put ice on areas that are sore or painful after a cramp. To do this: Put ice in a plastic bag. Place a towel between your skin and the bag. Leave the ice on for 20  minutes, 2-3 times a day. Remove the ice if your skin turns bright red. This is very important. If you cannot feel pain, heat, or cold, you have a greater risk of damage to the area. If directed, apply heat to muscles that are tense or tight. Do this before you exercise, or as often as told by your health care provider. Use the heat source that your health care provider recommends, such as a moist heat pack or a heating pad. To do this: Place a towel between your skin and the heat source. Leave the heat on for 20-30 minutes. Remove the heat if your skin turns bright red. This is especially important if you are unable to feel pain, heat, or cold. You may have a greater risk of getting burned. Try taking hot showers or baths to help relax tight muscles. General instructions If you are having frequent leg cramps, avoid intense exercise for several days. Take over-the-counter and prescription medicines only as told by your health care provider. Keep all follow-up visits. This is important. Contact a health care provider if: Your leg cramps get more severe or more frequent, or they do not improve over time. Your foot becomes cold, numb, or blue. Summary Muscle cramps can develop in any muscle, but the most common place is in the calf muscles of the leg. Leg cramps are painful, and they may last for a few seconds to a few minutes. Usually, leg cramps are not caused by a serious medical problem. Often, the cause is not known. Stay hydrated, and take over-the-counter and prescription medicines  only as told by your health care provider. This information is not intended to replace advice given to you by your health care provider. Make sure you discuss any questions you have with your health care provider. Document Revised: 09/03/2019 Document Reviewed: 09/03/2019 Elsevier Patient Education  [place].

## 2021-10-20 ENCOUNTER — Encounter (INDEPENDENT_AMBULATORY_CARE_PROVIDER_SITE_OTHER): Payer: Self-pay | Admitting: Bariatrics

## 2021-10-20 ENCOUNTER — Ambulatory Visit (INDEPENDENT_AMBULATORY_CARE_PROVIDER_SITE_OTHER): Payer: Medicare Other | Admitting: Bariatrics

## 2021-10-20 VITALS — BP 96/59 | HR 71 | Temp 97.7°F | Ht 65.0 in | Wt 202.0 lb

## 2021-10-20 DIAGNOSIS — E669 Obesity, unspecified: Secondary | ICD-10-CM

## 2021-10-20 DIAGNOSIS — Z7985 Long-term (current) use of injectable non-insulin antidiabetic drugs: Secondary | ICD-10-CM

## 2021-10-20 DIAGNOSIS — E559 Vitamin D deficiency, unspecified: Secondary | ICD-10-CM | POA: Diagnosis not present

## 2021-10-20 DIAGNOSIS — Z6833 Body mass index (BMI) 33.0-33.9, adult: Secondary | ICD-10-CM

## 2021-10-20 DIAGNOSIS — E1169 Type 2 diabetes mellitus with other specified complication: Secondary | ICD-10-CM | POA: Diagnosis not present

## 2021-10-20 MED ORDER — TRULICITY 1.5 MG/0.5ML ~~LOC~~ SOAJ: 1.5000 mg | SUBCUTANEOUS | 0 refills | Status: DC

## 2021-10-24 ENCOUNTER — Other Ambulatory Visit: Payer: Self-pay | Admitting: Family Medicine

## 2021-10-25 ENCOUNTER — Encounter (INDEPENDENT_AMBULATORY_CARE_PROVIDER_SITE_OTHER): Payer: Self-pay | Admitting: Bariatrics

## 2021-10-26 NOTE — Progress Notes (Unsigned)
[place] Phone: [phone] Subjective:   Jessica [name], am serving as a scribe for Dr. Hulan Saas.  I'm seeing this patient by the request  of:  [name], MD  CC   knee pain, back pain and ankle pain follow-up      GTX:[id number]  09/07/2021 Patient does have MRI of the back shows the patient does have multilevel spondylosis but I do think it seems to be more severe in the L4-L5 area.  Patient does have what appears to be more radicular symptoms in the L4-L5 right side radiculopathy that could be corresponding to some of the leg pain patient has had.  Patient's MRI of the ankle was fairly unremarkable, we discussed possible peripheral neuropathy with patient's diabetes is well but being a possibility.  I do would like to consider the possibility of epidural or nerve root injections for diagnostic as well as potentially therapeutic purposes.  Follow-up with me again 6 weeks after the injections to see how patient is responding.  Injection of Toradol given today as well which has helped patient in the past.  Updated 10/27/2021 Jessica [name] is a 71 y.o. female coming in with complaint of back pain. Epi on 09/23/2021. Did not feel like epidural helped. Felt like cocktail injection helped more. Continues to have pain in lower back.        Past Medical History:  Diagnosis Date   Anxiety    Arthritis    bilateral knees   Asthma    childhood   Autonomic neuropathy    diabetic   Cataract    Celiac disease    Constipation    Depression    Diabetes mellitus    type II, Hemoglobin A1C 9.9 10/05/2011   Diabetic neuropathy ([place])    Diverticulosis    Fatty liver    Gastroparesis    GERD (gastroesophageal reflux disease)    Hyperlipidemia    Hypertension    IBS (irritable bowel syndrome)    Kidney stones    Personal history of colonic polyps 03/2010   hyperplastic   Shingles 2021   Past Surgical  History:  Procedure Laterality Date   APPENDECTOMY     CATARACT EXTRACTION Right 04/29/2018   CATARACT EXTRACTION Left 03/2018   COLONOSCOPY     DILATATION & CURRETTAGE/HYSTEROSCOPY WITH RESECTOCOPE N/A 03/24/2013   Procedure: DILATATION & CURETTAGE, HYSTEROSCOPY WITH RESECTION;  Surgeon: Olga Millers, MD;  Location: [facility];  Service: Gynecology;  Laterality: N/A;   HYSTEROSCOPY WITH D & C N/A 11/08/2015   Procedure: DILATATION AND CURETTAGE /HYSTEROSCOPY;  Surgeon: Olga Millers, MD;  Location: [facility];  Service: Gynecology;  Laterality: N/A;   left breast cyst removal  2000   LEFT HEART CATH AND CORONARY ANGIOGRAPHY N/A 10/31/2018   Procedure: LEFT HEART CATH AND CORONARY ANGIOGRAPHY;  Surgeon: Jettie Booze, MD;  Location: [facility];  Service: Cardiovascular;  Laterality: N/A;   TUBAL LIGATION     Social History   Socioeconomic History   Marital status: Married    Spouse name: Devar Tapper   Number of children: 2   Years of education: Not on file   Highest education level: Not on file  Occupational History   Occupation: Scientist, 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    Comment: parttime   Occupation: retired  Tobacco Use   Smoking status: Never   Smokeless tobacco: Never  Vaping Use   Vaping Use: Never used  Substance and Sexual Activity   Alcohol use: [name]   Drug use: [name]   Sexual activity: Yes    Birth control/protection: Post-menopausal, Surgical  Other Topics Concern   Not on file  Social History Narrative   Not on file   Social Determinants of Health   Financial Resource Strain: Low Risk  (02/24/2021)   Overall Financial Resource Strain (CARDIA)    Difficulty of Paying Living Expenses: Not hard at all  Food Insecurity: [name] Food Insecurity (02/24/2021)   Hunger Vital Sign    Worried About Running Out of Food in the Last Year: Never true    Ran Out of Food in the Last Year: Never true  Transportation Needs: [name] Transportation Needs  (02/24/2021)   PRAPARE - Hydrologist (Medical): [name]    Lack of Transportation (Non-Medical): [name]  Physical Activity: Sufficiently Active (02/24/2021)   Exercise Vital Sign    Days of Exercise per Week: 7 days    Minutes of Exercise per Session: 60 min  Stress: Stress Concern Present (02/24/2021)   [place]    Feeling of Stress : To some extent  Social Connections: Moderately Integrated (02/24/2021)   Social Connection and Isolation Panel [NHANES]    Frequency of Communication with Friends and Family: More than three times a week    Frequency of Social Gatherings with Friends and Family: Once a week    Attends Religious Services: More than 4 times per year    Active Member of [employer] or Organizations: [name]    Attends Music therapist: Never    Marital Status: Married   Allergies  Allergen Reactions   Gluten Meal Other (See Comments)    [name] wheat or soy   Nsaids Nausea And Vomiting and Other (See Comments)    Cannot tolerate large doses    Soy Allergy    Wheat Bran    Family History  Problem Relation Age of Onset   Hypertension Mother    Colon cancer Sister        dx in her early 33s   Diabetes Sister    Endometrial cancer Sister    Hypertension Brother    Other Father        2 collapsed lungs   COPD Father    Diabetes Father    Heart attack Father    Heart failure Father    High blood pressure Father    Colon polyps Neg Hx    Esophageal cancer Neg Hx    Stomach cancer Neg Hx     Current Outpatient Medications (Endocrine & Metabolic):    Dulaglutide (TRULICITY) 1.5 NW/2.9FA SOPN, Inject 1.5 mg into the skin once a week.   insulin NPH-regular Human (HUMULIN 70/30) (70-30) 100 UNIT/ML injection, INJECT 38 UNITS WITH BREAKFAST, AND 10 UNITS WITH SUPPER.  Current Outpatient Medications (Cardiovascular):    atorvastatin (LIPITOR) 40 MG tablet, TAKE 1 TABLET BY MOUTH  EVERY DAY   furosemide (LASIX) 20 MG tablet, TAKE 1 TABLET (20 MG TOTAL) BY MOUTH DAILY AS NEEDED FOR FLUID OR EDEMA.   olmesartan (BENICAR) 40 MG tablet, TAKE 1 TABLET BY MOUTH EVERY DAY  Current Outpatient Medications (Respiratory):    albuterol (VENTOLIN HFA) 108 (90 Base) MCG/ACT inhaler, Inhale 2 puffs into the lungs every 6 (six) hours as needed for wheezing or shortness of breath.   fluticasone (FLONASE) 50 MCG/ACT nasal  spray, Place 1 spray into both nostrils daily.  Current Outpatient Medications (Analgesics):    aspirin EC 81 MG tablet, Take 81 mg by mouth daily. Swallow whole.   ibuprofen (ADVIL) 200 MG tablet, Take 200 mg by mouth daily.   Current Outpatient Medications (Other):    amoxicillin-clavulanate (AUGMENTIN) 875-125 MG tablet, Take 1 tablet by mouth 2 (two) times daily for 10 days.   BD INSULIN SYRINGE U/F 31G X 5/16" 1 ML MISC, USE UP TO 3 TIMES A DAY   dexlansoprazole (DEXILANT) 60 MG capsule, TAKE 1 CAPSULE BY MOUTH EVERY DAY   gabapentin (NEURONTIN) 600 MG tablet, TAKE 1 TABLET IN THE MORNING AND 2 TABLETS AT BEDTIME   ketoconazole (NIZORAL) 2 % cream, APPLY TWICE A DAY FOR 2 WEEKS AS NEEDED FOR RASH   ondansetron (ZOFRAN ODT) 4 MG disintegrating tablet, Take 1 tablet (4 mg total) by mouth every 8 (eight) hours as needed for nausea or vomiting.   ONETOUCH ULTRA test strip, USE 2 TIMES DAILY. AND LANCETS 2/DAY   polyethylene glycol powder ([employer] PURELAX) 17 GM/SCOOP powder, DISSOLVE 1 CAPFUL IN AT LEAST 8 OUNCES WATER/JUICE AND DRINK TWICE DAILY   sucralfate (CARAFATE) 1 GM/10ML suspension, Take 10 mLs (1 g total) by mouth 4 (four) times daily.   valACYclovir (VALTREX) 500 MG tablet, TAKE 1 TABLET BY MOUTH EVERY DAY   Vitamin D, Ergocalciferol, (DRISDOL) 1.25 MG (50000 UNIT) CAPS capsule, Take 1 capsule (50,000 Units total) by mouth every 7 (seven) days.   Reviewed prior external information including notes and imaging from  primary care provider As well as notes  that were available from care everywhere and other healthcare systems.  Past medical history, social, surgical and family history all reviewed in electronic medical record.  [name] pertanent information unless stated regarding to the chief complaint.   Review of Systems:  [name] headache, visual changes, nausea, vomiting, diarrhea, constipation, dizziness, abdominal pain, skin rash, fevers, chills, night sweats, weight loss, swollen lymph nodes, body aches, joint swelling, chest pain, shortness of breath, mood changes. POSITIVE muscle aches  Objective  Blood pressure 118/60, pulse 77, height 5' 5"  (1.651 m), weight 207 lb (93.9 kg), SpO2 98 %.   General: [name] apparent distress alert and oriented x3 mood and affect normal, dressed appropriately.  HEENT: Pupils equal, extraocular movements intact  Respiratory: Patient's speak in full sentences and does not appear short of breath  Cardiovascular: [name] lower extremity edema, non tender, [name] erythema  Ankle does have some swelling noted bilaterally with peroneal cyst noted.  Patient's knees do have some instability noted left greater than right with valgus and varus force.  Patient does have some mild crepitus noted. Back exam still has some mild loss of lordosis.  Patient does have improvement in range of motion.  Tightness with straight leg test.    Impression and Recommendations:

## 2021-10-27 ENCOUNTER — Ambulatory Visit (INDEPENDENT_AMBULATORY_CARE_PROVIDER_SITE_OTHER): Payer: Medicare Other | Admitting: Family Medicine

## 2021-10-27 ENCOUNTER — Encounter: Payer: Self-pay | Admitting: Family Medicine

## 2021-10-27 DIAGNOSIS — M170 Bilateral primary osteoarthritis of knee: Secondary | ICD-10-CM | POA: Diagnosis not present

## 2021-10-27 DIAGNOSIS — M5416 Radiculopathy, lumbar region: Secondary | ICD-10-CM | POA: Diagnosis not present

## 2021-10-27 NOTE — Assessment & Plan Note (Signed)
Patient did not respond extremely well to the epidural.  Feels like though she is improving on her own though.  Patient has done well with her weight recently as well as her A1c being better.  Discussed that this will be beneficial.  Still feel that some of her ankle pain could be secondary to this as well.  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  Patient will continue to be active and if necessary can consider an injection into the peroneal cyst to see if that is playing a role of the ankle.

## 2021-10-27 NOTE — Assessment & Plan Note (Signed)
Stable at the moment.  No other changes but if necessary would consider the possibility of repeating injections and viscosupplementation.  Patient is in agreement with the plan.

## 2021-10-27 NOTE — Patient Instructions (Addendum)
Good to see you Overall doing better Start working out for 10 min increasing to maximum 30 min See me 2-3 months

## 2021-10-28 ENCOUNTER — Other Ambulatory Visit: Payer: Self-pay

## 2021-10-28 DIAGNOSIS — Z794 Long term (current) use of insulin: Secondary | ICD-10-CM

## 2021-10-28 MED ORDER — ONETOUCH ULTRA VI STRP: ORAL_STRIP | 3 refills | Status: DC

## 2021-11-03 ENCOUNTER — Other Ambulatory Visit: Payer: Self-pay | Admitting: Family Medicine

## 2021-11-07 ENCOUNTER — Other Ambulatory Visit: Payer: Self-pay

## 2021-11-07 DIAGNOSIS — Z794 Long term (current) use of insulin: Secondary | ICD-10-CM

## 2021-11-07 MED ORDER — ONETOUCH ULTRA VI STRP: ORAL_STRIP | 3 refills | Status: AC

## 2021-11-24 ENCOUNTER — Ambulatory Visit (INDEPENDENT_AMBULATORY_CARE_PROVIDER_SITE_OTHER): Payer: Medicare Other | Admitting: Bariatrics

## 2021-11-29 ENCOUNTER — Other Ambulatory Visit (INDEPENDENT_AMBULATORY_CARE_PROVIDER_SITE_OTHER): Payer: Self-pay | Admitting: Bariatrics

## 2021-11-29 ENCOUNTER — Ambulatory Visit (INDEPENDENT_AMBULATORY_CARE_PROVIDER_SITE_OTHER): Payer: Medicare Other | Admitting: Bariatrics

## 2021-11-29 ENCOUNTER — Encounter (INDEPENDENT_AMBULATORY_CARE_PROVIDER_SITE_OTHER): Payer: Self-pay | Admitting: Bariatrics

## 2021-11-29 VITALS — BP 134/75 | HR 66 | Temp 98.0°F | Ht 65.0 in | Wt 205.0 lb

## 2021-11-29 DIAGNOSIS — Z7985 Long-term (current) use of injectable non-insulin antidiabetic drugs: Secondary | ICD-10-CM

## 2021-11-29 DIAGNOSIS — I10 Essential (primary) hypertension: Secondary | ICD-10-CM | POA: Diagnosis not present

## 2021-11-29 DIAGNOSIS — E1169 Type 2 diabetes mellitus with other specified complication: Secondary | ICD-10-CM

## 2021-11-29 DIAGNOSIS — E669 Obesity, unspecified: Secondary | ICD-10-CM | POA: Diagnosis not present

## 2021-11-29 DIAGNOSIS — Z6834 Body mass index (BMI) 34.0-34.9, adult: Secondary | ICD-10-CM | POA: Diagnosis not present

## 2021-11-29 MED ORDER — ONDANSETRON 4 MG PO TBDP: 4.0000 mg | ORAL_TABLET | Freq: Three times a day (TID) | ORAL | 0 refills | Status: DC | PRN

## 2021-11-29 MED ORDER — TRULICITY 0.75 MG/0.5ML ~~LOC~~ SOAJ: 0.7500 mg | SUBCUTANEOUS | 0 refills | Status: DC

## 2021-11-30 ENCOUNTER — Encounter: Payer: Self-pay | Admitting: Family Medicine

## 2021-12-05 ENCOUNTER — Encounter: Payer: Self-pay | Admitting: Family Medicine

## 2021-12-05 ENCOUNTER — Ambulatory Visit (INDEPENDENT_AMBULATORY_CARE_PROVIDER_SITE_OTHER): Payer: Medicare Other | Admitting: Family Medicine

## 2021-12-05 VITALS — BP 136/72 | HR 73 | Temp 98.2°F | Ht 65.0 in | Wt 210.4 lb

## 2021-12-05 DIAGNOSIS — K219 Gastro-esophageal reflux disease without esophagitis: Secondary | ICD-10-CM

## 2021-12-05 DIAGNOSIS — E1143 Type 2 diabetes mellitus with diabetic autonomic (poly)neuropathy: Secondary | ICD-10-CM | POA: Diagnosis not present

## 2021-12-05 DIAGNOSIS — J309 Allergic rhinitis, unspecified: Secondary | ICD-10-CM | POA: Diagnosis not present

## 2021-12-05 DIAGNOSIS — J320 Chronic maxillary sinusitis: Secondary | ICD-10-CM

## 2021-12-05 DIAGNOSIS — K3184 Gastroparesis: Secondary | ICD-10-CM

## 2021-12-05 LAB — POC COVID19 BINAXNOW: SARS Coronavirus 2 Ag: NEGATIVE

## 2021-12-05 MED ORDER — CEFUROXIME AXETIL 500 MG PO TABS: 500.0000 mg | ORAL_TABLET | Freq: Two times a day (BID) | ORAL | 0 refills | Status: AC

## 2021-12-05 MED ORDER — METHYLPREDNISOLONE ACETATE 80 MG/ML IJ SUSP
80.0000 mg | Freq: Once | INTRAMUSCULAR | Status: AC
  Administered 2021-12-05: 80 mg via INTRAMUSCULAR

## 2021-12-05 NOTE — Progress Notes (Signed)
Subjective  CC:  Chief Complaint  Patient presents with   Sinus Problem    Pt stated that she have been having sinu problems since June. Headache, teeth hurt, ear pain(Rt) yellow mucous, SOB, heartburn. COVID test was done about 3weeks ago    HPI: Jessica [name] is a 71 y.o. female who presents to the office today to address the problems listed above in the chief complaint. Patient with diabetes, diabetic gastroparesis, GERD on Dexilant, presents due to persistent sinus and reflux problems.  She was seen at the end of June.  We treated her for sinus infection at that time with Augmentin but she can only tolerate about 4 to 5 days of medications due to diarrhea.  She reports persistent sinus congestion, postnasal drainage, intermittent dental pain, fatigue, and some increase in her GERD symptoms.  Occasionally she feels she has wheezing.  She does carry diagnosis of mild intermittent asthma does not have chronic persistent shortness of breath.  She does use albuterol intermittently.  She has many years.  She does take Flonase but no antihistamine.  She denies fevers.  Assessment  1. Chronic maxillary sinusitis   2. Gastroesophageal reflux disease, unspecified whether esophagitis present   3. Chronic allergic rhinitis   4. Diabetic gastroparesis ([place])      Plan  Chronic maxillary sinusitis presumed: Will treat with Ceftin 500 twice daily for 10 days.  Continue Flonase, add Depo-Medrol 80 mg IM x 1.  Patient tolerated well.  Add Allegra daily.  Recheck in 2 to 4 weeks if not improving. GERD: Continue Dexilant.  May be worsening due to postnasal drainage.  She did have recent EGD that was unremarkable. Diabetic gastroparesis: Has intermittent nausea.  Will see if she can tolerate her medications.  She does see endocrinology to manage her diabetes  Follow up: As scheduled, sooner if symptoms persist 03/13/2022  No orders of the defined types were placed in this encounter.  No orders of  the defined types were placed in this encounter.     I reviewed the patients updated PMH, FH, and SocHx.    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[place]) 04/05/2020    Priority: High   Celiac disease 02/14/2018    Priority: High   Diabetic neuropathy associated with type 2 diabetes mellitus ([place]) 02/14/2018    Priority: High   Class 2 obesity due to excess calories with body mass index (BMI) of 37.0 to 37.9 in adult 02/14/2018    Priority: High   Type 2 diabetes mellitus with complication, with long-term current use of insulin ([place]) 09/23/2010    Priority: High   Combined hyperlipidemia associated with type 2 diabetes mellitus ([place]) 01/29/2009    Priority: High   Essential hypertension 10/22/2006    Priority: High   Osteopenia 02/14/2018    Priority: Medium    Mild intermittent asthma 02/14/2018    Priority: Medium    Degenerative arthritis of knee, bilateral 10/29/2015    Priority: Medium    Degenerative cervical disc 07/27/2015    Priority: Medium    OSA (obstructive sleep apnea) 02/28/2011    Priority: Medium    Constipation, slow transit 09/23/2010    Priority: Medium    FH: colon cancer 09/23/2010    Priority: Medium    Personal history of colonic polyps 09/23/2010    Priority: Medium    Diabetic gastroparesis ([place]) 02/27/2007    Priority:  Medium    Diabetic autonomic neuropathy associated with type 2 diabetes mellitus ([place]) 10/22/2006    Priority: Medium    Bilateral lower extremity edema 01/20/2020    Priority: Low   Fibroma of tongue 06/06/2016    Priority: Low   Renal calculi 01/13/2016    Priority: Low   Lumbar radiculopathy 08/25/2021   Problem related to unspecified psychosocial circumstances 07/05/2021   Localized swelling of right lower extremity 06/07/2021   Increased body mass index 06/01/2021   Thyroid nodule 10/28/2019   Abnormal stress test    Family history of malignant  neoplasm of endometrium 10/08/2018   Trigger point of right shoulder region 08/02/2015   Current Meds  Medication Sig   albuterol (VENTOLIN HFA) 108 (90 Base) MCG/ACT inhaler TAKE 2 PUFFS BY MOUTH EVERY 6 HOURS AS NEEDED FOR WHEEZE OR SHORTNESS OF BREATH   aspirin EC 81 MG tablet Take 81 mg by mouth daily. Swallow whole.   atorvastatin (LIPITOR) 40 MG tablet TAKE 1 TABLET BY MOUTH EVERY DAY   BD INSULIN SYRINGE U/F 31G X 5/16" 1 ML MISC USE UP TO 3 TIMES A DAY   dexlansoprazole (DEXILANT) 60 MG capsule TAKE 1 CAPSULE BY MOUTH EVERY DAY   Dulaglutide (TRULICITY) 3.26 ZT/2.4PY SOPN Inject 0.75 mg into the skin once a week.   fluticasone (FLONASE) 50 MCG/ACT nasal spray Place 1 spray into both nostrils daily.   furosemide (LASIX) 20 MG tablet TAKE 1 TABLET (20 MG TOTAL) BY MOUTH DAILY AS NEEDED FOR FLUID OR EDEMA.   gabapentin (NEURONTIN) 600 MG tablet TAKE 1 TABLET IN THE MORNING AND 2 TABLETS AT BEDTIME   glucose blood (ONETOUCH ULTRA) test strip USE 2 TIMES DAILY. AND LANCETS 2/DAY   ibuprofen (ADVIL) 200 MG tablet Take 200 mg by mouth daily.   insulin NPH-regular Human (HUMULIN 70/30) (70-30) 100 UNIT/ML injection INJECT 38 UNITS WITH BREAKFAST, AND 10 UNITS WITH SUPPER.   ketoconazole (NIZORAL) 2 % cream APPLY TWICE A DAY FOR 2 WEEKS AS NEEDED FOR RASH   olmesartan (BENICAR) 40 MG tablet TAKE 1 TABLET BY MOUTH EVERY DAY   ondansetron (ZOFRAN ODT) 4 MG disintegrating tablet Take 1 tablet (4 mg total) by mouth every 8 (eight) hours as needed for nausea or vomiting.   polyethylene glycol powder ([employer] PURELAX) 17 GM/SCOOP powder DISSOLVE 1 CAPFUL IN AT LEAST 8 OUNCES WATER/JUICE AND DRINK TWICE DAILY   sucralfate (CARAFATE) 1 GM/10ML suspension Take 10 mLs (1 g total) by mouth 4 (four) times daily.   valACYclovir (VALTREX) 500 MG tablet TAKE 1 TABLET BY MOUTH EVERY DAY   Vitamin D, Ergocalciferol, (DRISDOL) 1.25 MG (50000 UNIT) CAPS capsule Take 1 capsule (50,000 Units total) by mouth every 7  (seven) days.    Allergies: Patient is allergic to gluten meal, nsaids, soy allergy, and wheat bran. Family History: Patient family history includes COPD in her father; Colon cancer in her sister; Diabetes in her father and sister; Endometrial cancer in her sister; Heart attack in her father; Heart failure in her father; High blood pressure in her father; Hypertension in her brother and mother; Other in her father. Social History:  Patient  reports that she has never smoked. She has never used smokeless tobacco. She reports that she does not drink alcohol and does not use drugs.  Review of Systems: Constitutional: Negative for fever malaise or anorexia Cardiovascular: negative for chest pain Respiratory: negative for SOB or persistent cough Gastrointestinal: negative for abdominal pain  Objective  Vitals:  Ht 5' 5"  (1.651 m)   BMI 34.11 kg/m  General: no acute distress , A&Ox3, no respiratory distress HEENT: PEERL, conjunctiva normal, neck is supple mild sinus tenderness bilaterally Cardiovascular:  RRR without murmur or gallop.  Respiratory:  Good breath sounds bilaterally, CTAB with normal respiratory effort, no wheezing or rales Skin:  Warm, no rashes    Commons side effects, risks, benefits, and alternatives for medications and treatment plan prescribed today were discussed, and the patient expressed understanding of the given instructions. Patient is instructed to call or message via MyChart if he/she has any questions or concerns regarding our treatment plan. No barriers to understanding were identified. We discussed Red Flag symptoms and signs in detail. Patient expressed understanding regarding what to do in case of urgent or emergency type symptoms.  Medication list was reconciled, printed and provided to the patient in AVS. Patient instructions and summary information was reviewed with the patient as documented in the AVS. This note was prepared with assistance of Dragon voice  recognition software. Occasional wrong-word or sound-a-like substitutions may have occurred due to the inherent limitations of voice recognition software  This visit occurred during the SARS-CoV-2 public health emergency.  Safety protocols were in place, including screening questions prior to the visit, additional usage of staff PPE, and extensive cleaning of exam room while observing appropriate contact time as indicated for disinfecting solutions.

## 2021-12-05 NOTE — Patient Instructions (Signed)
Please follow up as scheduled for your next visit with me: 04/12/2022  But return sooner if your symptoms do not improve.   Take the antibiotic for 10 days. Let me know if it bothers you.  Add allegra daily for at least a month.   If you have any questions or concerns, please don't hesitate to send me a message via MyChart or call the office at [phone]. Thank you for visiting with [place] today! It's our pleasure caring for you.

## 2021-12-07 ENCOUNTER — Encounter (INDEPENDENT_AMBULATORY_CARE_PROVIDER_SITE_OTHER): Payer: Self-pay

## 2021-12-07 NOTE — Progress Notes (Unsigned)
Chief Complaint:   OBESITY Jessica [name] is here to discuss her progress with her obesity treatment plan along with follow-up of her obesity related diagnoses. Jessica [name] is on the Category 3 Plan and states she is following her eating plan approximately 90% of the time. Jessica [name] states she is not currently exercising.  Today's visit was #: 37 Starting weight: 246 lbs Starting date: 03/12/2019 Today's weight: 205 lbs Today's date: 11/29/21 Total lbs lost to date: 41 lbs Total lbs lost since last in-office visit: +3  Interim History: She is up 3 pounds since her last visit.  Subjective:   1. Type 2 diabetes mellitus with obesity (HCC) Was on 1.5 mg of Trulicity and will now now change back to Trulicity 5.78 mg.  2. Essential hypertension Well-controlled.  Assessment/Plan:   1. Type 2 diabetes mellitus with obesity (HCC) Refill- - ondansetron (ZOFRAN ODT) 4 MG disintegrating tablet; Take 1 tablet (4 mg total) by mouth every 8 (eight) hours as needed for nausea or vomiting.  Dispense: 30 tablet; Refill: 0  -Trulicity 4.69 mg into the skin once weekly, dispense 2 mL, no refills.  2. Essential hypertension Continue medication.  3. Obesity, Current BMI 34.1 1.  Meal planning. 2.  Intentional eating.  Jessica [name] is currently in the action stage of change. As such, her goal is to continue with weight loss efforts. She has agreed to the Category 3 Plan.   Exercise goals: All adults should avoid inactivity. Some physical activity is better than none, and adults who participate in any amount of physical activity gain some health benefits.  Behavioral modification strategies: increasing lean protein intake, decreasing simple carbohydrates, increasing vegetables, increasing water intake, decreasing eating out, no skipping meals, meal planning and cooking strategies, keeping healthy foods in the home, and planning for success.  Jessica [name] has agreed to follow-up with our clinic in 4 weeks. She was  informed of the importance of frequent follow-up visits to maximize her success with intensive lifestyle modifications for her multiple health conditions.    Objective:   Blood pressure 134/75, pulse 66, temperature 98 F (36.7 C), height 5' 5"  (1.651 m), weight 205 lb (93 kg), SpO2 99 %. Body mass index is 34.11 kg/m.  General: Cooperative, alert, well developed, in no acute distress. HEENT: Conjunctivae and lids unremarkable. Cardiovascular: Regular rhythm.  Lungs: Normal work of breathing. Neurologic: No focal deficits.   Lab Results  Component Value Date   CREATININE 0.95 06/09/2021   BUN 23 06/09/2021   NA 138 06/09/2021   K 4.6 06/09/2021   CL 102 06/09/2021   CO2 33 (H) 06/09/2021   Lab Results  Component Value Date   ALT 14 06/09/2021   AST 16 06/09/2021   ALKPHOS 69 06/09/2021   BILITOT 0.7 06/09/2021   Lab Results  Component Value Date   HGBA1C 6.3 (A) 10/19/2021   HGBA1C 7.7 (A) 04/13/2021   HGBA1C 8.2 (A) 01/12/2021   HGBA1C 8.1 (A) 08/12/2020   HGBA1C 7.8 (A) 05/14/2020   No results found for: "INSULIN" Lab Results  Component Value Date   TSH 0.91 04/13/2021   Lab Results  Component Value Date   CHOL 135 04/13/2021   HDL 59.70 04/13/2021   LDLCALC 65 04/13/2021   LDLDIRECT 143.2 11/23/2006   TRIG 50.0 04/13/2021   CHOLHDL 2 04/13/2021   Lab Results  Component Value Date   VD25OH 44.70 04/13/2021   VD25OH 85.0 09/07/2020   VD25OH 61.6 03/11/2020   Lab Results  Component Value  Date   WBC 3.4 (L) 06/09/2021   HGB 14.7 06/09/2021   HCT 43.7 06/09/2021   MCV 99.0 06/09/2021   PLT 214.0 06/09/2021   Lab Results  Component Value Date   IRON 113 09/07/2020   TIBC 276 09/07/2020   FERRITIN 149 09/07/2020     Attestation Statements:   Reviewed by clinician on day of visit: allergies, medications, problem list, medical history, surgical history, family history, social history, and previous encounter notes.  I, Dawn Whitmire, FNP-C, am  acting as transcriptionist for Dr. [name].  I have reviewed the above documentation for accuracy and completeness, and I agree with the above. [name], DO

## 2021-12-08 ENCOUNTER — Encounter (INDEPENDENT_AMBULATORY_CARE_PROVIDER_SITE_OTHER): Payer: Self-pay | Admitting: Bariatrics

## 2021-12-20 ENCOUNTER — Ambulatory Visit (INDEPENDENT_AMBULATORY_CARE_PROVIDER_SITE_OTHER): Payer: Medicare Other | Admitting: Sports Medicine

## 2021-12-20 VITALS — BP 130/68 | HR 101 | Ht 65.0 in | Wt 210.0 lb

## 2021-12-20 DIAGNOSIS — M542 Cervicalgia: Secondary | ICD-10-CM

## 2021-12-20 DIAGNOSIS — M5030 Other cervical disc degeneration, unspecified cervical region: Secondary | ICD-10-CM | POA: Diagnosis not present

## 2021-12-20 MED ORDER — KETOROLAC TROMETHAMINE 60 MG/2ML IM SOLN
60.0000 mg | Freq: Once | INTRAMUSCULAR | Status: AC
Start: 2021-12-20 — End: 2021-12-20
  Administered 2021-12-20: 60 mg via INTRAMUSCULAR

## 2021-12-20 MED ORDER — TIZANIDINE HCL 2 MG PO TABS: 2.0000 mg | ORAL_TABLET | Freq: Every evening | ORAL | 0 refills | Status: DC | PRN

## 2021-12-20 NOTE — Progress Notes (Signed)
Jessica [name] D.[place] Phone: [phone]   Assessment and Plan:     1. Neck pain 2. DDD (degenerative disc disease), cervical -Chronic with exacerbation, initial sports medicine visit -Consistent with acute flare of cervical DDD as seen on prior x-ray.  Flare likely due to patient assisting with caregiving for her husband - Patient elected for Toradol 60 mg IM injection at today's visit.  Tolerated well.  Patient does not tolerate p.o. NSAIDs well, which is why we decided to proceed with IM injection. - Start Tylenol 500 to 1000 mg tablets 2-3 times a day for day-to-day pain relief - Start Zanaflex 2 mg nightly as needed for muscle spasms.  Refill of medication provided today - Start HEP for neck -No significant change in neck pain symptoms, no significant MOI, so imaging was not repeated at today's visit  Pertinent previous records reviewed include C-spine x-ray 2019   Follow Up: As needed if no improvement or worsening of symptoms   Subjective:   I, Jessica [name], am serving as a Education administrator for Doctor Glennon Mac  Chief Complaint: neck pain   HPI:   12/20/21 Patient is a 71 year old female complaining of neck pain. Patient states is about to go out of town right sided neck and shoulder pain , she was pulling and lifting suitcases last week she has pain, when she ices or when she heats the pain will usually go away but it hasn't gone away , she has decreased ROM , does not want to travel 8 hours with her neck in bad shape , been going on since Saturday, no MOI , gradual onset of pain , no meds for pain , she did heat this morning and that helped it some  Relevant Historical Information: DM type II hypertension, OSA, celiac  Additional pertinent review of systems negative.   Current Outpatient Medications:    albuterol (VENTOLIN HFA) 108 (90 Base) MCG/ACT inhaler, TAKE 2 PUFFS BY MOUTH EVERY 6  HOURS AS NEEDED FOR WHEEZE OR SHORTNESS OF BREATH, Disp: 6.7 each, Rfl: 3   aspirin EC 81 MG tablet, Take 81 mg by mouth daily. Swallow whole., Disp: , Rfl:    atorvastatin (LIPITOR) 40 MG tablet, TAKE 1 TABLET BY MOUTH EVERY DAY, Disp: 90 tablet, Rfl: 3   BD INSULIN SYRINGE U/F 31G X 5/16" 1 ML MISC, USE UP TO 3 TIMES A DAY, Disp: 100 each, Rfl: 5   dexlansoprazole (DEXILANT) 60 MG capsule, TAKE 1 CAPSULE BY MOUTH EVERY DAY, Disp: 90 capsule, Rfl: 3   Dulaglutide (TRULICITY) 3.25 QD/8.2ME SOPN, Inject 0.75 mg into the skin once a week., Disp: 2 mL, Rfl: 0   fluticasone (FLONASE) 50 MCG/ACT nasal spray, Place 1 spray into both nostrils daily., Disp: 16 g, Rfl: 6   furosemide (LASIX) 20 MG tablet, TAKE 1 TABLET (20 MG TOTAL) BY MOUTH DAILY AS NEEDED FOR FLUID OR EDEMA., Disp: 90 tablet, Rfl: 3   gabapentin (NEURONTIN) 600 MG tablet, TAKE 1 TABLET IN THE MORNING AND 2 TABLETS AT BEDTIME, Disp: 270 tablet, Rfl: 2   glucose blood (ONETOUCH ULTRA) test strip, USE 2 TIMES DAILY. AND LANCETS 2/DAY, Disp: 200 strip, Rfl: 3   ibuprofen (ADVIL) 200 MG tablet, Take 200 mg by mouth daily., Disp: , Rfl:    insulin NPH-regular Human (HUMULIN 70/30) (70-30) 100 UNIT/ML injection, INJECT 38 UNITS WITH BREAKFAST, AND 10 UNITS WITH SUPPER., Disp: 60 mL, Rfl: 0  ketoconazole (NIZORAL) 2 % cream, APPLY TWICE A DAY FOR 2 WEEKS AS NEEDED FOR RASH, Disp: 30 g, Rfl: 2   olmesartan (BENICAR) 40 MG tablet, TAKE 1 TABLET BY MOUTH EVERY DAY, Disp: 90 tablet, Rfl: 3   ondansetron (ZOFRAN ODT) 4 MG disintegrating tablet, Take 1 tablet (4 mg total) by mouth every 8 (eight) hours as needed for nausea or vomiting., Disp: 30 tablet, Rfl: 0   polyethylene glycol powder ([employer] PURELAX) 17 GM/SCOOP powder, DISSOLVE 1 CAPFUL IN AT LEAST 8 OUNCES WATER/JUICE AND DRINK TWICE DAILY, Disp: 1020 g, Rfl: 5   sucralfate (CARAFATE) 1 GM/10ML suspension, Take 10 mLs (1 g total) by mouth 4 (four) times daily., Disp: 420 mL, Rfl: 1   tiZANidine  (ZANAFLEX) 2 MG tablet, Take 1 tablet (2 mg total) by mouth at bedtime as needed for muscle spasms., Disp: 20 tablet, Rfl: 0   valACYclovir (VALTREX) 500 MG tablet, TAKE 1 TABLET BY MOUTH EVERY DAY, Disp: 90 tablet, Rfl: 3   Vitamin D, Ergocalciferol, (DRISDOL) 1.25 MG (50000 UNIT) CAPS capsule, Take 1 capsule (50,000 Units total) by mouth every 7 (seven) days., Disp: 4 capsule, Rfl: 0   Objective:     Vitals:   12/20/21 1330  BP: 130/68  Pulse: (!) 101  SpO2: 99%  Weight: 210 lb (95.3 kg)  Height: 5' 5"  (1.651 m)      Body mass index is 34.95 kg/m.    Physical Exam:    Cervical Spine: Posture normal Skin: normal, intact  Neurological:   Strength:  Right  Left   Deltoid 5/5 5/5  Bicep 5/5  5/5  Tricep 5/5 5/5  Wrist Flexion 5/5 5/5  Wrist Extension 5/5 5/5  Grip 5/5 5/5  Finger Abduction 5/5 5/5   Sensation: intact to light touch in upper extremities bilaterally  Spurling's:  negative bilaterally Neck ROM:   active ROM significantly reduced in bilateral sidebending and moderately reduced and bilateral rotation TTP: Moderate right cervical paraspinal, mild left cervical paraspinal, mild bilateral trapezius NTTP: cervical spinous processes,  thoracic paraspinal,    Electronically signed by:  Jessica [name] D.[facility] 1:52 PM 12/20/21

## 2021-12-20 NOTE — Patient Instructions (Addendum)
Good to see you  Tylenol [phone] mg 2-3 times a day for pain relief  Zanaflex refill 2 mg at bed  time as needed for muscle spasms 20 0 Neck HEP  As needed follow up

## 2021-12-27 ENCOUNTER — Other Ambulatory Visit (INDEPENDENT_AMBULATORY_CARE_PROVIDER_SITE_OTHER): Payer: Self-pay | Admitting: Bariatrics

## 2021-12-27 DIAGNOSIS — E1169 Type 2 diabetes mellitus with other specified complication: Secondary | ICD-10-CM

## 2021-12-29 ENCOUNTER — Ambulatory Visit (INDEPENDENT_AMBULATORY_CARE_PROVIDER_SITE_OTHER): Payer: Medicare Other | Admitting: Bariatrics

## 2022-01-03 ENCOUNTER — Ambulatory Visit (INDEPENDENT_AMBULATORY_CARE_PROVIDER_SITE_OTHER): Payer: Medicare Other | Admitting: Family Medicine

## 2022-01-03 ENCOUNTER — Encounter: Payer: Self-pay | Admitting: Family Medicine

## 2022-01-03 ENCOUNTER — Telehealth: Payer: Self-pay | Admitting: Family Medicine

## 2022-01-03 VITALS — BP 122/74 | HR 71 | Temp 97.6°F | Ht 65.0 in | Wt 208.2 lb

## 2022-01-03 DIAGNOSIS — J320 Chronic maxillary sinusitis: Secondary | ICD-10-CM | POA: Diagnosis not present

## 2022-01-03 DIAGNOSIS — J40 Bronchitis, not specified as acute or chronic: Secondary | ICD-10-CM | POA: Diagnosis not present

## 2022-01-03 MED ORDER — CEFUROXIME AXETIL 500 MG PO TABS: 500.0000 mg | ORAL_TABLET | Freq: Two times a day (BID) | ORAL | 0 refills | Status: AC

## 2022-01-03 NOTE — Telephone Encounter (Signed)
Pt was advised to go to ED  Patient Name: [facility] ALES Gender: Female DOB: 27-Sep-1950 Age: 71 Y 3 M 4 D Return Phone Number: [phone] (Primary) Address: City/ State/ Zip: [place]  [address] Client [place] [facility] [place] Client Site [place] [facility] [place] Night Provider Billey Chang- MD Contact Type Call Who Is Calling Patient / Member / Family / Caregiver Call Type Triage / Clinical Relationship To Patient Self Return Phone Number [phone] (Primary) Chief Complaint Facial Pain Reason for Call Symptomatic / Request for [place] states she had a sinus infection starting a month ago. She was given antibiotics, an inhaler, and Claritin. She is still having Sx (drainage, indigestion, pain). She is wanting tx. Translation No Nurse Assessment Nurse: Ferdinand Lango, RN, Kenney Houseman Date/Time (Eastern Time): 01/02/2022 9:40:52 AM Confirm and document reason for call. If symptomatic, describe symptoms. ---Caller states she was treated about 1 month ago for sinus infection and she feels like she never really got fully better. She is having sinus congestion/pain that is causing her teeth to hurt. Denies chest pain but states she feels a lump in her throat and thinks it is coming from sinus drainage. Denies fever. Does the patient have any new or worsening symptoms? ---Yes Will a triage be completed? ---Yes Related visit to physician within the last 2 weeks? ---No Does the PT have any chronic conditions? (i.e. diabetes, asthma, this includes High risk factors for pregnancy, etc.) ---Yes List chronic conditions. ---DM, HTN, Hyperlipidemia Is this a behavioral health or substance abuse call? ---No Guidelines Guideline Title Affirmed Question Affirmed Notes Nurse Date/Time (Eastern Time) Sinus Pain or Congestion [1] Difficulty breathing AND [2] not from stuffy nose Ferdinand Lango RN, Kenney Houseman 01/02/2022  9:45:09 AM Guidelines Guideline Title Affirmed Question Affirmed Notes Nurse Date/Time Eilene Ghazi Time) (e.g., not relieved by cleaning out the nose) Disp. Time Eilene Ghazi Time) Disposition Final User 01/02/2022 9:47:31 AM Go to ED Now Yes Ferdinand Lango, RN, Kenney Houseman Final Disposition 01/02/2022 9:47:31 AM Go to ED Now Yes Ferdinand Lango, RN, Alex Gardener Disagree/Comply Comply Caller Understands Yes PreDisposition Did not know what to do Care Advice Given Per Guideline GO TO ED NOW: * You need to be seen in the Emergency Department. * Leave now. Drive carefully. * Bring a list of your current medicines when you go to the Emergency Department (ER). BRING MEDICINES: CARE ADVICE given per Sinus Pain or Congestion (Adult) guideline. Referrals GO TO FACILITY UNDECIDED

## 2022-01-03 NOTE — Progress Notes (Signed)
Subjective:     Patient ID: Jessica [name], female    DOB: 1950/05/13, 71 y.o.   MRN: [id number]  Chief Complaint  Patient presents with   Facial Pain   Headache   Sinus Problem    Sx started over 1 month ago, sx not getting any better Had 3 negative covid test    Wheezing    Started yesterday   Chest congestion    HPI Facial pain, ha.  Sinus issues for >80mo   Has had 3 neg covid tests-2 wks ago. Saw Dr. AArlester Markerabx.  Got inhaler and flonase and claritin. Still  going on.  Got better for 1 wk after abx, but coming back.   On 9/2-a lot of post nasal drip and indigestion.  Some chills.  No fevers.   Teeth were hurting.  Congestion worse at HS when lies down.  No feather pillows.   Wheezing since yesterday.  Chest congestion  On 8/7 got ceftin 500 bid for 10d and depomedrol 899m Sugar 133 yesterday.  A1C 6.3 in June.   There are no preventive care reminders to display for this patient.  Past Medical History:  Diagnosis Date   Anxiety    Arthritis    bilateral knees   Asthma    childhood   Autonomic neuropathy    diabetic   Cataract    Celiac disease    Constipation    Depression    Diabetes mellitus    type II, Hemoglobin A1C 9.9 10/05/2011   Diabetic neuropathy ([place]   Diverticulosis    Fatty liver    Gastroparesis    GERD (gastroesophageal reflux disease)    Hyperlipidemia    Hypertension    IBS (irritable bowel syndrome)    Kidney stones    Personal history of colonic polyps 03/2010   hyperplastic   Shingles 2021    Past Surgical History:  Procedure Laterality Date   APPENDECTOMY     CATARACT EXTRACTION Right 04/29/2018   CATARACT EXTRACTION Left 03/2018   COLONOSCOPY     DILATATION & CURRETTAGE/HYSTEROSCOPY WITH RESECTOCOPE N/A 03/24/2013   Procedure: DILATATION & CURETTAGE, HYSTEROSCOPY WITH RESECTION;  Surgeon: MaOlga MillersMD;  [facility];  Service: Gynecology;  Laterality: N/A;   HYSTEROSCOPY WITH D & C N/A 11/08/2015   Procedure:  DILATATION AND CURETTAGE /HYSTEROSCOPY;  Surgeon: MaOlga MillersMD;  [facility];  Service: Gynecology;  Laterality: N/A;   left breast cyst removal  2000   LEFT HEART CATH AND CORONARY ANGIOGRAPHY N/A 10/31/2018   Procedure: LEFT HEART CATH AND CORONARY ANGIOGRAPHY;  Surgeon: VaJettie BoozeMD;  [facility];  Service: Cardiovascular;  Laterality: N/A;   TUBAL LIGATION      Outpatient Medications Prior to Visit  Medication Sig Dispense Refill   albuterol (VENTOLIN HFA) 108 (90 Base) MCG/ACT inhaler TAKE 2 PUFFS BY MOUTH EVERY 6 HOURS AS NEEDED FOR WHEEZE OR SHORTNESS OF BREATH 6.7 each 3   aspirin EC 81 MG tablet Take 81 mg by mouth daily. Swallow whole.     atorvastatin (LIPITOR) 40 MG tablet TAKE 1 TABLET BY MOUTH EVERY DAY 90 tablet 3   BD INSULIN SYRINGE U/F 31G X 5/16" 1 ML MISC USE UP TO 3 TIMES A DAY 100 each 5   dexlansoprazole (DEXILANT) 60 MG capsule TAKE 1 CAPSULE BY MOUTH EVERY DAY 90 capsule 3   Dulaglutide (TRULICITY) 0.5.17GOH/6.0VPOPN Inject 0.75 mg into the skin  once a week.     fluticasone (FLONASE) 50 MCG/ACT nasal spray Place 1 spray into both nostrils daily. 16 g 6   furosemide (LASIX) 20 MG tablet TAKE 1 TABLET (20 MG TOTAL) BY MOUTH DAILY AS NEEDED FOR FLUID OR EDEMA. 90 tablet 3   gabapentin (NEURONTIN) 600 MG tablet TAKE 1 TABLET IN THE MORNING AND 2 TABLETS AT BEDTIME 270 tablet 2   glucose blood (ONETOUCH ULTRA) test strip USE 2 TIMES DAILY. AND LANCETS 2/DAY 200 strip 3   ibuprofen (ADVIL) 200 MG tablet Take 200 mg by mouth daily.     insulin NPH-regular Human (HUMULIN 70/30) (70-30) 100 UNIT/ML injection INJECT 38 UNITS WITH BREAKFAST, AND 10 UNITS WITH SUPPER. 60 mL 0   ketoconazole (NIZORAL) 2 % cream APPLY TWICE A DAY FOR 2 WEEKS AS NEEDED FOR RASH 30 g 2   olmesartan (BENICAR) 40 MG tablet TAKE 1 TABLET BY MOUTH EVERY DAY 90 tablet 3   ondansetron (ZOFRAN ODT) 4 MG disintegrating tablet Take 1 tablet (4 mg total) by mouth every 8  (eight) hours as needed for nausea or vomiting. 30 tablet 0   polyethylene glycol powder ([employer] PURELAX) 17 GM/SCOOP powder DISSOLVE 1 CAPFUL IN AT LEAST 8 OUNCES WATER/JUICE AND DRINK TWICE DAILY 1020 g 5   sucralfate (CARAFATE) 1 GM/10ML suspension Take 10 mLs (1 g total) by mouth 4 (four) times daily. 420 mL 1   tiZANidine (ZANAFLEX) 2 MG tablet Take 1 tablet (2 mg total) by mouth at bedtime as needed for muscle spasms. 20 tablet 0   valACYclovir (VALTREX) 500 MG tablet TAKE 1 TABLET BY MOUTH EVERY DAY 90 tablet 3   Vitamin D, Ergocalciferol, (DRISDOL) 1.25 MG (50000 UNIT) CAPS capsule Take 1 capsule (50,000 Units total) by mouth every 7 (seven) days. 4 capsule 0   Dulaglutide (TRULICITY) 1.85 UD/1.4HF SOPN Inject 0.75 mg into the skin once a week. 2 mL 0   No facility-administered medications prior to visit.    Allergies  Allergen Reactions   Codeine     Other reaction(s): vomiting   Gluten Meal Other (See Comments)    No wheat or soy   Metronidazole     Other reaction(s): vomiting   Nsaids Nausea And Vomiting and Other (See Comments)    Cannot tolerate large doses  Other reaction(s): vomiting Other reaction(s): vomiting   Soy Allergy    Wheat Bran    ROS neg/noncontributory except as noted HPI/below      Objective:     BP 122/74   Pulse 71   Temp 97.6 F (36.4 C) (Temporal)   Ht 5' 5"  (1.651 m)   Wt 208 lb 4 oz (94.5 kg)   SpO2 96%   BMI 34.65 kg/m  Wt Readings from Last 3 Encounters:  01/03/22 208 lb 4 oz (94.5 kg)  12/20/21 210 lb (95.3 kg)  12/05/21 210 lb 6.4 oz (95.4 kg)    Physical Exam   Gen: WDWN NAD HEENT: NCAT, conjunctiva not injected, sclera nonicteric TM WNL B, OP moist, no exudates  NECK:  supple, no thyromegaly, no nodes, no carotid bruits CARDIAC: RRR, S1S2+, no murmur.  LUNGS: CTAB. No wheezes EXT:  no edema MSK: no gross abnormalities.  NEURO: A&O x3.  CN II-XII intact.  PSYCH: normal mood. Good eye contact     Assessment & Plan:    Problem List Items Addressed This Visit   None Visit Diagnoses     Chronic maxillary sinusitis    -  Primary   Relevant Medications   cefUROXime (CEFTIN) 500 MG tablet   Bronchitis       Relevant Orders   DG Chest 2 View      Sinusitis-recurrant.  Will repeat ceftin 563m bid x 10 more days.  If not improving, consider CT sinuses Bronchitis-ceftin.  Cxr if not improving.    Meds ordered this encounter  Medications   cefUROXime (CEFTIN) 500 MG tablet    Sig: Take 1 tablet (500 mg total) by mouth 2 (two) times daily with a meal for 10 days.    Dispense:  20 tablet    Refill:  0    AWellington Hampshire MD

## 2022-01-03 NOTE — Telephone Encounter (Signed)
Patient states: -Access nurse's determination was that she needed to go to the ED due to possibly having walking pneumonia.  - She is unable to go to ED due to husband being sick and having no one to watch him   Patient requests: -Recommendations on what could be done

## 2022-01-03 NOTE — Patient Instructions (Addendum)
Tums, pepcid.  Chest x-ray if not improving.    Antibiotics sent to pharmacy

## 2022-01-06 ENCOUNTER — Ambulatory Visit (INDEPENDENT_AMBULATORY_CARE_PROVIDER_SITE_OTHER)
Admission: RE | Admit: 2022-01-06 | Discharge: 2022-01-06 | Disposition: A | Discharge status: Home / Self Care | Payer: Medicare Other | Source: Ambulatory Visit | Attending: Family Medicine | Admitting: Family Medicine

## 2022-01-06 DIAGNOSIS — J40 Bronchitis, not specified as acute or chronic: Secondary | ICD-10-CM | POA: Diagnosis not present

## 2022-01-12 ENCOUNTER — Ambulatory Visit (INDEPENDENT_AMBULATORY_CARE_PROVIDER_SITE_OTHER): Payer: Medicare Other | Admitting: Bariatrics

## 2022-01-12 ENCOUNTER — Encounter: Payer: Self-pay | Admitting: Bariatrics

## 2022-01-12 VITALS — BP 134/53 | HR 61 | Temp 97.6°F | Ht 65.0 in | Wt 207.0 lb

## 2022-01-12 DIAGNOSIS — E669 Obesity, unspecified: Secondary | ICD-10-CM | POA: Diagnosis not present

## 2022-01-12 DIAGNOSIS — E1169 Type 2 diabetes mellitus with other specified complication: Secondary | ICD-10-CM

## 2022-01-12 DIAGNOSIS — Z6841 Body Mass Index (BMI) 40.0 and over, adult: Secondary | ICD-10-CM

## 2022-01-12 DIAGNOSIS — Z7985 Long-term (current) use of injectable non-insulin antidiabetic drugs: Secondary | ICD-10-CM

## 2022-01-12 DIAGNOSIS — Z6834 Body mass index (BMI) 34.0-34.9, adult: Secondary | ICD-10-CM

## 2022-01-12 DIAGNOSIS — I10 Essential (primary) hypertension: Secondary | ICD-10-CM | POA: Diagnosis not present

## 2022-01-12 MED ORDER — ONDANSETRON 4 MG PO TBDP: 4.0000 mg | ORAL_TABLET | Freq: Three times a day (TID) | ORAL | 0 refills | Status: DC | PRN

## 2022-01-12 MED ORDER — TRULICITY 0.75 MG/0.5ML ~~LOC~~ SOAJ: 0.7500 mg | SUBCUTANEOUS | 0 refills | Status: DC

## 2022-01-16 DIAGNOSIS — E669 Obesity, unspecified: Secondary | ICD-10-CM | POA: Insufficient documentation

## 2022-01-16 DIAGNOSIS — E1169 Type 2 diabetes mellitus with other specified complication: Secondary | ICD-10-CM | POA: Insufficient documentation

## 2022-01-18 ENCOUNTER — Encounter: Payer: Self-pay | Admitting: Bariatrics

## 2022-01-18 NOTE — Progress Notes (Signed)
Chief Complaint:   OBESITY Jessica [name] is here to discuss her progress with her obesity treatment plan along with follow-up of her obesity related diagnoses. Jessica [name] is on the Category 3 Plan and states she is following her eating plan approximately 80% of the time. Jessica [name] states she is doing 0 minutes 0 times per week.  Today's visit was #: 49 Starting weight: 246 lbs Starting date: 03/12/2019 Today's weight: 207 lbs Today's date: 01/12/2022 Total lbs lost to date: 39 Total lbs lost since last in-office visit: 0  Interim History: Jessica [name] is up 2 lbs since her last visit. Her husband has been in the hospital.   Subjective:   1. Type 2 diabetes mellitus with obesity ([place]) Jessica [name] denies nausea or vomiting.   2. Essential hypertension Jessica [name]'s blood pressure is stable.   Assessment/Plan:   1. Type 2 diabetes mellitus with obesity ([place]) Jessica [name] will continue her medications, and we will refill Trulicity and Zofran for 1 moth.   - Dulaglutide (TRULICITY) 0.37 CW/8.8QB SOPN; Inject 0.75 mg into the skin once a week.  Dispense: 2 mL; Refill: 0 - ondansetron (ZOFRAN ODT) 4 MG disintegrating tablet; Take 1 tablet (4 mg total) by mouth every 8 (eight) hours as needed for nausea or vomiting.  Dispense: 30 tablet; Refill: 0  2. Essential hypertension Jessica [name] will continue her medications as directed.   3. Obesity, Current BMI 34.6 Jessica [name] is currently in the action stage of change. As such, her goal is to continue with weight loss efforts. She has agreed to the Category 3 Plan.   Meal planning and intentional eating were discussed.   Exercise goals: No exercise has been prescribed at this time.  Behavioral modification strategies: increasing lean protein intake, decreasing simple carbohydrates, increasing vegetables, increasing water intake, decreasing eating out, no skipping meals, meal planning and cooking strategies, keeping healthy foods in the home, and planning for success.  Jessica [name]  has agreed to follow-up with our clinic in 4 weeks. She was informed of the importance of frequent follow-up visits to maximize her success with intensive lifestyle modifications for her multiple health conditions.   Objective:   Blood pressure (!) 134/53, pulse 61, temperature 97.6 F (36.4 C), height 5' 5"  (1.651 m), weight 207 lb (93.9 kg), SpO2 98 %. Body mass index is 34.45 kg/m.  General: Cooperative, alert, well developed, in no acute distress. HEENT: Conjunctivae and lids unremarkable. Cardiovascular: Regular rhythm.  Lungs: Normal work of breathing. Neurologic: No focal deficits.   Lab Results  Component Value Date   CREATININE 0.95 06/09/2021   BUN 23 06/09/2021   NA 138 06/09/2021   K 4.6 06/09/2021   CL 102 06/09/2021   CO2 33 (H) 06/09/2021   Lab Results  Component Value Date   ALT 14 06/09/2021   AST 16 06/09/2021   ALKPHOS 69 06/09/2021   BILITOT 0.7 06/09/2021   Lab Results  Component Value Date   HGBA1C 6.3 (A) 10/19/2021   HGBA1C 7.7 (A) 04/13/2021   HGBA1C 8.2 (A) 01/12/2021   HGBA1C 8.1 (A) 08/12/2020   HGBA1C 7.8 (A) 05/14/2020   No results found for: "INSULIN" Lab Results  Component Value Date   TSH 0.91 04/13/2021   Lab Results  Component Value Date   CHOL 135 04/13/2021   HDL 59.70 04/13/2021   LDLCALC 65 04/13/2021   LDLDIRECT 143.2 11/23/2006   TRIG 50.0 04/13/2021   CHOLHDL 2 04/13/2021   Lab Results  Component Value Date   VD25OH 44.70 04/13/2021  VD25OH 85.0 09/07/2020   VD25OH 61.6 03/11/2020   Lab Results  Component Value Date   WBC 3.4 (L) 06/09/2021   HGB 14.7 06/09/2021   HCT 43.7 06/09/2021   MCV 99.0 06/09/2021   PLT 214.0 06/09/2021   Lab Results  Component Value Date   IRON 113 09/07/2020   TIBC 276 09/07/2020   FERRITIN 149 09/07/2020   Attestation Statements:   Reviewed by clinician on day of visit: allergies, medications, problem list, medical history, surgical history, family history, social history,  and previous encounter notes.   Wilhemena Durie, am acting as Location manager for [employer], DO.  I have reviewed the above documentation for accuracy and completeness, and I agree with the above. [name], DO

## 2022-01-25 NOTE — Progress Notes (Unsigned)
[facility] [place] Phone: [phone] Subjective:   [name], am serving as a scribe for Dr. Hulan Saas.  I'm seeing this patient by the request  of:  [name], MD  CC: back pain follow up   WGN:[id number]  10/27/2021 Patient did not respond extremely well to the epidural.  Feels like though she is improving on her own though.  Patient has done well with her weight recently as well as her A1c being better.  Discussed that this will be beneficial.  Still feel that some of her ankle pain could be secondary to this as well.  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  Patient will continue to be active and if necessary can consider an injection into the peroneal cyst to see if that is playing a role of the ankle.  Stable at the moment.  No other changes but if necessary would consider the possibility of repeating injections and viscosupplementation.  Patient is in agreement with the plan.  Saw Dr. Glennon Mac in Aug 2023 for neck pain  Updated 01/26/2022 Wynona Duhamel Burruel is a 71 y.o. female coming in with complaint of back, neck and knee pain. Neck is better after an injection in backside from Dr. Glennon Mac. Pain is still present but is much less intense.  Back pain did flare up since last visit. Used mm relaxer to help with pain.   L knee is sore today due to changes in weather. Unable to go up stairs without pain in both knees.       Past Medical History:  Diagnosis Date   Anxiety    Arthritis    bilateral knees   Asthma    childhood   Autonomic neuropathy    diabetic   Cataract    Celiac disease    Constipation    Depression    Diabetes mellitus    type II, Hemoglobin A1C 9.9 10/05/2011   Diabetic neuropathy ([place])    Diverticulosis    Fatty liver    Gastroparesis    GERD (gastroesophageal reflux disease)    Hyperlipidemia    Hypertension     IBS (irritable bowel syndrome)    Kidney stones    Personal history of colonic polyps 03/2010   hyperplastic   Shingles 2021   Past Surgical History:  Procedure Laterality Date   APPENDECTOMY     CATARACT EXTRACTION Right 04/29/2018   CATARACT EXTRACTION Left 03/2018   COLONOSCOPY     DILATATION & CURRETTAGE/HYSTEROSCOPY WITH RESECTOCOPE N/A 03/24/2013   Procedure: DILATATION & CURETTAGE, HYSTEROSCOPY WITH RESECTION;  Surgeon: Olga Millers, MD;  Location: [facility];  Service: Gynecology;  Laterality: N/A;   HYSTEROSCOPY WITH D & C N/A 11/08/2015   Procedure: DILATATION AND CURETTAGE /HYSTEROSCOPY;  Surgeon: Olga Millers, MD;  Location: [facility];  Service: Gynecology;  Laterality: N/A;   left breast cyst removal  2000   LEFT HEART CATH AND CORONARY ANGIOGRAPHY N/A 10/31/2018   Procedure: LEFT HEART CATH AND CORONARY ANGIOGRAPHY;  Surgeon: Jettie Booze, MD;  Location: [facility];  Service: Cardiovascular;  Laterality: N/A;   TUBAL LIGATION     Social History   Socioeconomic History   Marital status: Married    Spouse name: Devar Fehnel   Number of children: 2   Years of education: Not on file   Highest education level: Not on  file  Occupational History   Occupation: Scientist, 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   Occupation: retired  Tobacco Use   Smoking status: Never   Smokeless tobacco: Never  Vaping Use   Vaping Use: Never used  Substance and Sexual Activity   Alcohol use: No   Drug use: No   Sexual activity: Yes    Birth control/protection: Post-menopausal, Surgical  Other Topics Concern   Not on file  Social History Narrative   Not on file   Social Determinants of Health   Financial Resource Strain: Low Risk  (02/24/2021)   Overall Financial Resource Strain (CARDIA)    Difficulty of Paying Living Expenses: Not hard at all  Food Insecurity: No Food Insecurity (02/24/2021)   Hunger Vital Sign    Worried About  Running Out of Food in the Last Year: Never true    [place] in the Last Year: Never true  Transportation Needs: No Transportation Needs (02/24/2021)   PRAPARE - Hydrologist (Medical): No    Lack of Transportation (Non-Medical): No  Physical Activity: Sufficiently Active (02/24/2021)   Exercise Vital Sign    Days of Exercise per Week: 7 days    Minutes of Exercise per Session: 60 min  Stress: Stress Concern Present (02/24/2021)   [place]    Feeling of Stress : To some extent  Social Connections: Moderately Integrated (02/24/2021)   Social Connection and Isolation Panel [NHANES]    Frequency of Communication with Friends and Family: More than three times a week    Frequency of Social Gatherings with Friends and Family: Once a week    Attends Religious Services: More than 4 times per year    Active Member of [employer] or Organizations: No    Attends Music therapist: Never    Marital Status: Married   Allergies  Allergen Reactions   Codeine     Other reaction(s): vomiting   Gluten Meal Other (See Comments)    No wheat or soy   Metronidazole     Other reaction(s): vomiting   Nsaids Nausea And Vomiting and Other (See Comments)    Cannot tolerate large doses  Other reaction(s): vomiting Other reaction(s): vomiting   Soy Allergy    Wheat Bran    Family History  Problem Relation Age of Onset   Hypertension Mother    Colon cancer Sister        dx in her early 49s   Diabetes Sister    Endometrial cancer Sister    Hypertension Brother    Other Father        2 collapsed lungs   COPD Father    Diabetes Father    Heart attack Father    Heart failure Father    High blood pressure Father    Colon polyps Neg Hx    Esophageal cancer Neg Hx    Stomach cancer Neg Hx     Current Outpatient Medications (Endocrine & Metabolic):    Dulaglutide (TRULICITY) 0.16 PV/3.7SM  SOPN, Inject 0.75 mg into the skin once a week.   insulin NPH-regular Human (HUMULIN 70/30) (70-30) 100 UNIT/ML injection, INJECT 38 UNITS WITH BREAKFAST, AND 10 UNITS WITH SUPPER.  Current Outpatient Medications (Cardiovascular):    atorvastatin (LIPITOR) 40 MG tablet, TAKE 1 TABLET BY MOUTH EVERY DAY   furosemide (LASIX) 20 MG tablet, TAKE 1 TABLET (20 MG  TOTAL) BY MOUTH DAILY AS NEEDED FOR FLUID OR EDEMA.   olmesartan (BENICAR) 40 MG tablet, TAKE 1 TABLET BY MOUTH EVERY DAY  Current Outpatient Medications (Respiratory):    albuterol (VENTOLIN HFA) 108 (90 Base) MCG/ACT inhaler, TAKE 2 PUFFS BY MOUTH EVERY 6 HOURS AS NEEDED FOR WHEEZE OR SHORTNESS OF BREATH   fluticasone (FLONASE) 50 MCG/ACT nasal spray, Place 1 spray into both nostrils daily.  Current Outpatient Medications (Analgesics):    aspirin EC 81 MG tablet, Take 81 mg by mouth daily. Swallow whole.   ibuprofen (ADVIL) 200 MG tablet, Take 200 mg by mouth daily.   Current Outpatient Medications (Other):    BD INSULIN SYRINGE U/F 31G X 5/16" 1 ML MISC, USE UP TO 3 TIMES A DAY   dexlansoprazole (DEXILANT) 60 MG capsule, TAKE 1 CAPSULE BY MOUTH EVERY DAY   gabapentin (NEURONTIN) 600 MG tablet, TAKE 1 TABLET IN THE MORNING AND 2 TABLETS AT BEDTIME   glucose blood (ONETOUCH ULTRA) test strip, USE 2 TIMES DAILY. AND LANCETS 2/DAY   ketoconazole (NIZORAL) 2 % cream, APPLY TWICE A DAY FOR 2 WEEKS AS NEEDED FOR RASH   ondansetron (ZOFRAN ODT) 4 MG disintegrating tablet, Take 1 tablet (4 mg total) by mouth every 8 (eight) hours as needed for nausea or vomiting.   polyethylene glycol powder ([employer] PURELAX) 17 GM/SCOOP powder, DISSOLVE 1 CAPFUL IN AT LEAST 8 OUNCES WATER/JUICE AND DRINK TWICE DAILY   sucralfate (CARAFATE) 1 GM/10ML suspension, Take 10 mLs (1 g total) by mouth 4 (four) times daily.   tiZANidine (ZANAFLEX) 2 MG tablet, Take 1 tablet (2 mg total) by mouth at bedtime.   valACYclovir (VALTREX) 500 MG tablet, TAKE 1 TABLET BY MOUTH  EVERY DAY   Vitamin D, Ergocalciferol, (DRISDOL) 1.25 MG (50000 UNIT) CAPS capsule, Take 1 capsule (50,000 Units total) by mouth every 7 (seven) days.   Reviewed prior external information including notes and imaging from  primary care provider As well as notes that were available from care everywhere and other healthcare systems.  Reviewing patient's chart was seen by another provider.  Was given a Toradol injection, patient is now the primary caregiver for her ailing husband as well.  Past medical history, social, surgical and family history all reviewed in electronic medical record.  No pertanent information unless stated regarding to the chief complaint.   Review of Systems:  No headache, visual changes, nausea, vomiting, diarrhea, constipation, dizziness, abdominal pain, skin rash, fevers, chills, night sweats, weight loss, swollen lymph nodes, body aches, joint swelling, chest pain, shortness of breath, mood changes. POSITIVE muscle aches  Objective  Blood pressure 124/76, pulse 70, height 5' 5"  (1.651 m), weight 212 lb (96.2 kg), SpO2 98 %.   General: No apparent distress alert and oriented x3 mood and affect normal, dressed appropriately.  HEENT: Pupils equal, extraocular movements intact  Respiratory: Patient's speak in full sentences and does not appear short of breath  Cardiovascular: No lower extremity edema, non tender, no erythema   Back exam does have some mild loss of lordosis.  Patient does have limited sidebending bilaterally.  Patient also has crepitus in the neck with lack of extension degrees of extension.  Knee exam shows arthritic changes noted with crepitus.  Small amount of effusion bilaterally.   Impression and Recommendations:    The above documentation has been reviewed and is accurate and complete Lyndal Pulley, DO

## 2022-01-26 ENCOUNTER — Ambulatory Visit (INDEPENDENT_AMBULATORY_CARE_PROVIDER_SITE_OTHER): Payer: Medicare Other

## 2022-01-26 ENCOUNTER — Encounter: Payer: Self-pay | Admitting: Family Medicine

## 2022-01-26 ENCOUNTER — Ambulatory Visit (INDEPENDENT_AMBULATORY_CARE_PROVIDER_SITE_OTHER): Payer: Medicare Other | Admitting: Family Medicine

## 2022-01-26 VITALS — BP 124/76 | HR 70 | Ht 65.0 in | Wt 212.0 lb

## 2022-01-26 DIAGNOSIS — M5030 Other cervical disc degeneration, unspecified cervical region: Secondary | ICD-10-CM | POA: Diagnosis not present

## 2022-01-26 DIAGNOSIS — M542 Cervicalgia: Secondary | ICD-10-CM | POA: Diagnosis not present

## 2022-01-26 DIAGNOSIS — M170 Bilateral primary osteoarthritis of knee: Secondary | ICD-10-CM | POA: Diagnosis not present

## 2022-01-26 DIAGNOSIS — M5416 Radiculopathy, lumbar region: Secondary | ICD-10-CM

## 2022-01-26 MED ORDER — TIZANIDINE HCL 2 MG PO TABS: 2.0000 mg | ORAL_TABLET | Freq: Every day | ORAL | 0 refills | Status: DC

## 2022-01-26 NOTE — Assessment & Plan Note (Signed)
Known arthritic changes of the neck as well.  Discussed which activities to do and which ones to avoid, increase activity slowly.  Follow-up again in 6 to 8 weeks.

## 2022-01-26 NOTE — Assessment & Plan Note (Signed)
For the low back pain and the neck pain patient did respond extremely well though to the Zanaflex and would like a refill.  Currently warned of potential side effects.  Patient will monitor for this.  Follow-up again in 6 to 8 weeks

## 2022-01-26 NOTE — Patient Instructions (Addendum)
Will get gel approval If back gets worse, write [place] and will order epidural Refill Zanaflex

## 2022-01-26 NOTE — Assessment & Plan Note (Signed)
Patient wants to avoid any other steroids at the moment.  Patient would like to consider viscosupplementation again.  Patient will increase activity slowly otherwise.  Follow-up again in 6 to 8 weeks.

## 2022-02-04 ENCOUNTER — Other Ambulatory Visit: Payer: Self-pay | Admitting: Podiatry

## 2022-02-08 ENCOUNTER — Ambulatory Visit (INDEPENDENT_AMBULATORY_CARE_PROVIDER_SITE_OTHER): Payer: Medicare Other | Admitting: Internal Medicine

## 2022-02-08 ENCOUNTER — Other Ambulatory Visit: Payer: Self-pay | Admitting: Family Medicine

## 2022-02-08 ENCOUNTER — Encounter: Payer: Self-pay | Admitting: Internal Medicine

## 2022-02-08 VITALS — BP 128/68 | HR 87 | Ht 65.0 in | Wt 213.8 lb

## 2022-02-08 DIAGNOSIS — E042 Nontoxic multinodular goiter: Secondary | ICD-10-CM | POA: Diagnosis not present

## 2022-02-08 DIAGNOSIS — E1142 Type 2 diabetes mellitus with diabetic polyneuropathy: Secondary | ICD-10-CM

## 2022-02-08 DIAGNOSIS — E1169 Type 2 diabetes mellitus with other specified complication: Secondary | ICD-10-CM | POA: Diagnosis not present

## 2022-02-08 DIAGNOSIS — E1165 Type 2 diabetes mellitus with hyperglycemia: Secondary | ICD-10-CM

## 2022-02-08 DIAGNOSIS — E782 Mixed hyperlipidemia: Secondary | ICD-10-CM

## 2022-02-08 LAB — POCT GLYCOSYLATED HEMOGLOBIN (HGB A1C): Hemoglobin A1C: 6.6 % — AB (ref 4.0–5.6)

## 2022-02-08 MED ORDER — HUMULIN 70/30 (70-30) 100 UNIT/ML ~~LOC~~ SUSP: SUBCUTANEOUS | 3 refills | Status: DC

## 2022-02-08 NOTE — Progress Notes (Signed)
Patient ID: Jessica [name], female   DOB: [birth year], 71 y.o.   MRN: [id number]  HPI: Jessica [name] is a 71 y.o.-year-old female, returning for follow-up for DM2, dx in 2000, insulin-dependent since 2005, uncontrolled, with complications (DR, PN, gastroparesis, autonomic neuropathy). Pt. previously saw Dr. Loanne Drilling, last visit 11/2021 (video).  DM2: Reviewed HbA1c: Lab Results  Component Value Date   HGBA1C 6.3 (A) 10/19/2021   HGBA1C 7.7 (A) 04/13/2021   HGBA1C 8.2 (A) 01/12/2021   HGBA1C 8.1 (A) 08/12/2020   HGBA1C 7.8 (A) 05/14/2020   HGBA1C 7.1 (A) 12/31/2019   HGBA1C 6.9 (A) 10/28/2019   HGBA1C 6.7 (H) 07/24/2019   HGBA1C 7.2 (A) 02/20/2019   HGBA1C 6.9 (A) 11/20/2018   Pt is on a regimen of: - Humulin 70/30 40 >> 32 units after b'fast and 8 >> 10 units before dinner - Trulicity 0.25 mg weekly - adjusted by Weight Loss Specialist (on nausea med)- could not tolerate a higher dose She had nausea and stomach pain with Ozempic.  Pt checks her sugars 0-1x a day and they are: - am: 120-180 - 2h after b'fast: n/c - before lunch: n/c - 2h after lunch: n/c - before dinner: 100-125 - 2h after dinner: n/c - bedtime: n/c - nighttime: n/c Lowest sugar was 77; she has hypoglycemia awareness at 100.  Highest sugar was 180.  Glucometer: One Touch ultra  She goes to the weight management clinic.  - no CKD, last BUN/creatinine:  Lab Results  Component Value Date   BUN 23 06/09/2021   BUN 22 04/13/2021   CREATININE 0.95 06/09/2021   CREATININE 0.98 04/13/2021  She is on olmesartan 40 mg daily.  -+ HL; last set of lipids: Lab Results  Component Value Date   CHOL 135 04/13/2021   HDL 59.70 04/13/2021   LDLCALC 65 04/13/2021   LDLDIRECT 143.2 11/23/2006   TRIG 50.0 04/13/2021   CHOLHDL 2 04/13/2021  She is on Lipitor 40 mg daily.  - last eye exam was  01/2021: no DR reportedly. + DR in 2021.   - + numbness and tingling in her feet.  On Neurontin 600 mg in a.m. and 1200  mg at night.  MNG:  Thyroid U/S (11/18/2019): Parenchymal Echotexture: Mildly heterogenous  Isthmus: 0.5 cm  Right lobe: 7.0 cm x 1.7 cm x 1.7 cm  Left lobe: 4.5 cm x 1.3 cm x 1.5 cm _________________________________________________________   Estimated total number of nodules >/= 1 cm: 3 _________________________________________________________   Nodule # 1:  Location: Isthmus; lower  Maximum size: 1.2 cm; Other 2 dimensions: 1.1 cm x 1.1 cm  Composition: cannot determine (2)  Echogenicity: isoechoic (1) Nodule does not meet criteria for surveillance or biopsy   _________________________________________________________   Nodule # 2:  Location: Right; Mid  Maximum size: 1.3 cm; Other 2 dimensions: 1.1 cm x 0.8 cm  Composition: cannot determine (2)  Echogenicity: isoechoic (1) Nodule does not meet criteria for surveillance or biopsy  _________________________________________________________   Nodule # 3:   Location: Right; Inferior  Maximum size: 1.9 cm; Other 2 dimensions: 1.6 cm x 1.2 cm  Composition: cannot determine (2)  Echogenicity: isoechoic (1) Nodule meets criteria for surveillance _________________________________________________________   No adenopathy   IMPRESSION: Multinodular thyroid.   Right inferior thyroid nodule (labeled 3, TR 3, 1.9 cm) meets criteria for surveillance, as designated by the newly established ACR TI-RADS criteria. Surveillance ultrasound study recommended to be performed annually up to 5 years.   She remembers having  had a thyroid Bx by Dr. Loanne Drilling >> reportedly benign.  TSH levels have been normal: Lab Results  Component Value Date   TSH 0.91 04/13/2021   TSH 1.320 09/07/2020   TSH 2.15 10/16/2018   TSH 1.27 10/25/2017   TSH 1.46 10/02/2016   TSH 1.55 11/03/2015   TSH 0.74 05/21/2013   TSH 1.00 11/13/2012   TSH 1.680 04/04/2012   TSH 1.43 08/10/2010   TSH 1.10 04/01/2008   TSH 1.24 11/23/2006   Pt denies: - feeling  nodules in neck - hoarseness - dysphagia - choking  ROS: + see HPI No increased urination, blurry vision, nausea, chest pain.  Past Medical History:  Diagnosis Date   Anxiety    Arthritis    bilateral knees   Asthma    childhood   Autonomic neuropathy    diabetic   Cataract    Celiac disease    Constipation    Depression    Diabetes mellitus    type II, Hemoglobin A1C 9.9 10/05/2011   Diabetic neuropathy ([place])    Diverticulosis    Fatty liver    Gastroparesis    GERD (gastroesophageal reflux disease)    Hyperlipidemia    Hypertension    IBS (irritable bowel syndrome)    Kidney stones    Personal history of colonic polyps 03/2010   hyperplastic   Shingles 2021   Past Surgical History:  Procedure Laterality Date   APPENDECTOMY     CATARACT EXTRACTION Right 04/29/2018   CATARACT EXTRACTION Left 03/2018   COLONOSCOPY     DILATATION & CURRETTAGE/HYSTEROSCOPY WITH RESECTOCOPE N/A 03/24/2013   Procedure: DILATATION & CURETTAGE, HYSTEROSCOPY WITH RESECTION;  Surgeon: Olga Millers, MD;  Location: [facility];  Service: Gynecology;  Laterality: N/A;   HYSTEROSCOPY WITH D & C N/A 11/08/2015   Procedure: DILATATION AND CURETTAGE /HYSTEROSCOPY;  Surgeon: Olga Millers, MD;  Location: [facility];  Service: Gynecology;  Laterality: N/A;   left breast cyst removal  2000   LEFT HEART CATH AND CORONARY ANGIOGRAPHY N/A 10/31/2018   Procedure: LEFT HEART CATH AND CORONARY ANGIOGRAPHY;  Surgeon: Jettie Booze, MD;  Location: [facility];  Service: Cardiovascular;  Laterality: N/A;   TUBAL LIGATION     Social History   Socioeconomic History   Marital status: Married    Spouse name: Devar Maslowski   Number of children: 2   Years of education: Not on file   Highest education level: Not on file  Occupational History   Occupation: Scientist, 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   Occupation: retired  Tobacco Use   Smoking status: Never    Smokeless tobacco: Never  Vaping Use   Vaping Use: Never used  Substance and Sexual Activity   Alcohol use: No   Drug use: No   Sexual activity: Yes    Birth control/protection: Post-menopausal, Surgical  Other Topics Concern   Not on file  Social History Narrative   Not on file   Social Determinants of Health   Financial Resource Strain: Low Risk  (02/24/2021)   Overall Financial Resource Strain (CARDIA)    Difficulty of Paying Living Expenses: Not hard at all  Food Insecurity: No Food Insecurity (02/24/2021)   Hunger Vital Sign    Worried About Running Out of Food in the Last Year: Never true    [place] in the Last Year: Never true  Transportation Needs: No Transportation Needs (02/24/2021)  PRAPARE - Hydrologist (Medical): No    Lack of Transportation (Non-Medical): No  Physical Activity: Sufficiently Active (02/24/2021)   Exercise Vital Sign    Days of Exercise per Week: 7 days    Minutes of Exercise per Session: 60 min  Stress: Stress Concern Present (02/24/2021)   [place]    Feeling of Stress : To some extent  Social Connections: Moderately Integrated (02/24/2021)   Social Connection and Isolation Panel [NHANES]    Frequency of Communication with Friends and Family: More than three times a week    Frequency of Social Gatherings with Friends and Family: Once a week    Attends Religious Services: More than 4 times per year    Active Member of [employer] or Organizations: No    Attends Archivist Meetings: Never    Marital Status: Married  Human resources officer Violence: Not At Risk (02/24/2021)   Humiliation, Afraid, Rape, and Kick questionnaire    Fear of Current or Ex-Partner: No    Emotionally Abused: No    Physically Abused: No    Sexually Abused: No   Current Outpatient Medications on File Prior to Visit  Medication Sig Dispense Refill   albuterol  (VENTOLIN HFA) 108 (90 Base) MCG/ACT inhaler TAKE 2 PUFFS BY MOUTH EVERY 6 HOURS AS NEEDED FOR WHEEZE OR SHORTNESS OF BREATH 6.7 each 3   aspirin EC 81 MG tablet Take 81 mg by mouth daily. Swallow whole.     atorvastatin (LIPITOR) 40 MG tablet TAKE 1 TABLET BY MOUTH EVERY DAY 90 tablet 3   BD INSULIN SYRINGE U/F 31G X 5/16" 1 ML MISC USE UP TO 3 TIMES A DAY 100 each 5   dexlansoprazole (DEXILANT) 60 MG capsule TAKE 1 CAPSULE BY MOUTH EVERY DAY 90 capsule 3   Dulaglutide (TRULICITY) 2.33 AQ/7.6AU SOPN Inject 0.75 mg into the skin once a week. 2 mL 0   fluticasone (FLONASE) 50 MCG/ACT nasal spray Place 1 spray into both nostrils daily. 16 g 6   furosemide (LASIX) 20 MG tablet TAKE 1 TABLET (20 MG TOTAL) BY MOUTH DAILY AS NEEDED FOR FLUID OR EDEMA. 90 tablet 3   gabapentin (NEURONTIN) 600 MG tablet TAKE 1 TABLET IN THE MORNING AND 2 TABLETS AT BEDTIME 270 tablet 2   glucose blood (ONETOUCH ULTRA) test strip USE 2 TIMES DAILY. AND LANCETS 2/DAY 200 strip 3   ibuprofen (ADVIL) 200 MG tablet Take 200 mg by mouth daily.     insulin NPH-regular Human (HUMULIN 70/30) (70-30) 100 UNIT/ML injection INJECT 38 UNITS WITH BREAKFAST, AND 10 UNITS WITH SUPPER. 60 mL 0   ketoconazole (NIZORAL) 2 % cream APPLY TWICE A DAY FOR 2 WEEKS AS NEEDED FOR RASH 30 g 2   olmesartan (BENICAR) 40 MG tablet TAKE 1 TABLET BY MOUTH EVERY DAY 90 tablet 3   ondansetron (ZOFRAN ODT) 4 MG disintegrating tablet Take 1 tablet (4 mg total) by mouth every 8 (eight) hours as needed for nausea or vomiting. 30 tablet 0   polyethylene glycol powder ([facility] PURELAX) 17 GM/SCOOP powder DISSOLVE 1 CAPFUL IN AT LEAST 8 OUNCES WATER/JUICE AND DRINK TWICE DAILY 1020 g 5   sucralfate (CARAFATE) 1 GM/10ML suspension Take 10 mLs (1 g total) by mouth 4 (four) times daily. 420 mL 1   tiZANidine (ZANAFLEX) 2 MG tablet Take 1 tablet (2 mg total) by mouth at bedtime. 30 tablet 0   valACYclovir (VALTREX) 500  MG tablet TAKE 1 TABLET BY MOUTH EVERY DAY 90 tablet  3   Vitamin D, Ergocalciferol, (DRISDOL) 1.25 MG (50000 UNIT) CAPS capsule Take 1 capsule (50,000 Units total) by mouth every 7 (seven) days. 4 capsule 0   No current facility-administered medications on file prior to visit.   Allergies  Allergen Reactions   Codeine     Other reaction(s): vomiting   Gluten Meal Other (See Comments)    No wheat or soy   Metronidazole     Other reaction(s): vomiting   Nsaids Nausea And Vomiting and Other (See Comments)    Cannot tolerate large doses  Other reaction(s): vomiting Other reaction(s): vomiting   Soy Allergy    Wheat Bran    Family History  Problem Relation Age of Onset   Hypertension Mother    Colon cancer Sister        dx in her early 21s   Diabetes Sister    Endometrial cancer Sister    Hypertension Brother    Other Father        2 collapsed lungs   COPD Father    Diabetes Father    Heart attack Father    Heart failure Father    High blood pressure Father    Colon polyps Neg Hx    Esophageal cancer Neg Hx    Stomach cancer Neg Hx    PE: BP 128/68 (BP Location: Right Arm, Patient Position: Sitting, Cuff Size: Normal)   Pulse 87   Ht 5' 5"  (1.651 m)   Wt 213 lb 12.8 oz (97 kg)   SpO2 97%   BMI 35.58 kg/m  Wt Readings from Last 3 Encounters:  02/08/22 213 lb 12.8 oz (97 kg)  01/26/22 212 lb (96.2 kg)  01/12/22 207 lb (93.9 kg)   Constitutional: overweight, in NAD Eyes: no exophthalmos ENT: moist mucous membranes, no thyromegaly, no cervical lymphadenopathy Cardiovascular: RRR, No MRG Respiratory: CTA B Musculoskeletal: no deformities Skin: moist, warm, no rashes Neurological: no tremor with outstretched hands  ASSESSMENT: 1. DM2, insulin-dependent, uncontrolled, with complications - gastroparesis - PN - Autonomic neuropathy - DR  2. HL  3.  Multinodular goiter  PLAN:  1. Patient with long-standing, uncontrolled diabetes, on injectable antidiabetic regimen (premixed insulin due to cost, weekly GLP-1  receptor agonist), with improving control.  Latest HbA1c was 6.3% in 09/2021. -At today's visit, HbA1c is slightly higher, at 6.6%.  Blood sugars at home have been elevated in the morning and she is not checking sugars later in the day.  I strongly advised her to start. -In the meantime, I suggested to take the premixed insulin correctly, 30 minutes before meals.  She currently takes the morning dose after breakfast.  I also advised her to increase the dose of insulin before dinner slightly to improve her morning sugars.  However, it is paramount to check some blood sugars after dinner, to make sure that we are not giving her too much insulin at night.  She agrees to do so. -Her Trulicity dose is adjusted by Dr. Owens Shark.  She is on a low-dose and she could not tolerate higher doses possibly due to celiac disease versus gastroparesis. -She is on insulin vials and she is interested in switching to pens and possibly to analog insulins.  We will try this after the first of the year, at next visit. - I suggested to:  Patient Instructions  Please use: - Humulin 70/30 32 units 30 min before b'fast and  14 units  30 min before dinner - Trulicity 4.09 mg weekly   Check some sugars later in the day.  Please return in 4 months with your sugar log.   - check sugars at different times of the day - check 2x a day, rotating checks - discussed about CBG targets for treatment: 80-130 mg/dL before meals and <180 mg/dL after meals; target HbA1c <7%. - given foot care handout  - given instructions for hypoglycemia management "15-15 rule"  - advised for yearly eye exams  - Return to clinic in 3 to 4 months  2. HL - Reviewed latest lipid panel from 03/2021: Fractions at goal: Lab Results  Component Value Date   CHOL 135 04/13/2021   HDL 59.70 04/13/2021   LDLCALC 65 04/13/2021   LDLDIRECT 143.2 11/23/2006   TRIG 50.0 04/13/2021   CHOLHDL 2 04/13/2021  - Continues Lipitor 40 mg daily without side  effects.  3. MNG - no neck compression symptoms and no masses felt on palpation of her neck today - Latest TSH was normal: Lab Results  Component Value Date   TSH 0.91 04/13/2021  -Latest thyroid ultrasound was from 2021.  The nodules were unchanged.  The right lower pole nodule qualified for the need for follow-up based on size. -She tells me that she had a thyroid biopsy that returned benign by Dr. Loanne Drilling in the past.  I was not able to find a record of this. -will check another ultrasound now -May need a new biopsy, if the thyroid nodule has grown or changed ultrasound characteristics  Orders Placed This Encounter  Procedures   US THYROID   POCT glycosylated hemoglobin (Hb A1C)   - Total time spent for the visit: 40 min, in precharting, reviewing Dr. Cordelia Pen last note, obtaining medical information from the chart and from the pt, reviewing her  previous labs, evaluations, and treatments, reviewing her symptoms, counseling her about her diabetes and thyroid conditions in the (please see the discussed topics above), and developing a plan to further investigate and treat them; she had a number of questions which I addressed.  Thyroid U/S (02/17/2022): Parenchymal Echotexture: Mildly heterogenous  Isthmus: 1.3 cm  Right lobe: 5.2 cm x 1.4 cm x 1.7 cm  Left lobe: 4.9 cm x 1.1 cm x 1.5 cm  _________________________________________________________   Estimated total number of nodules >/= 1 cm: 2 _________________________________________________________   Nodule labeled 1 in the isthmus, 1.2 cm, unchanged in size. Nodule remains TR 3 and meets criteria for surveillance.   Nodule labeled 2 right lower thyroid, slightly smaller now measuring 1.1 cm. Nodule has spongiform characteristics on the current ultrasound and does not meet criteria for further surveillance.   No adenopathy   IMPRESSION: Isthmic thyroid nodule (labeled 1, 1.2 cm, TR 3) meets criteria for surveillance, as  designated by the newly established ACR TI-RADS criteria. Surveillance ultrasound study recommended to be performed annually up to 5 years, which would be July 2026.     Philemon Kingdom, MD PhD [facility] Endocrinology

## 2022-02-08 NOTE — Patient Instructions (Addendum)
Please use: - Humulin 70/30 32 units 30 min before b'fast and  14 units 30 min before dinner - Trulicity 9.70 mg weekly   Check some sugars later in the day.  Please return in 4 months with your sugar log.   PATIENT INSTRUCTIONS FOR TYPE 2 DIABETES:  DIET AND EXERCISE Diet and exercise is an important part of diabetic treatment.  We recommended aerobic exercise in the form of brisk walking (working between 40-60% of maximal aerobic capacity, similar to brisk walking) for 150 minutes per week (such as 30 minutes five days per week) along with 3 times per week performing 'resistance' training (using various gauge rubber tubes with handles) 5-10 exercises involving the major muscle groups (upper body, lower body and core) performing 10-15 repetitions (or near fatigue) each exercise. Start at half the above goal but build slowly to reach the above goals. If limited by weight, joint pain, or disability, we recommend daily walking in a swimming pool with water up to waist to reduce pressure from joints while allow for adequate exercise.    BLOOD GLUCOSES Monitoring your blood glucoses is important for continued management of your diabetes. Please check your blood glucoses 2-4 times a day: fasting, before meals and at bedtime (you can rotate these measurements - e.g. one day check before the 3 meals, the next day check before 2 of the meals and before bedtime, etc.).   HYPOGLYCEMIA (low blood sugar) Hypoglycemia is usually a reaction to not eating, exercising, or taking too much insulin/ other diabetes drugs.  Symptoms include tremors, sweating, hunger, confusion, headache, etc. Treat IMMEDIATELY with 15 grams of Carbs: 4 glucose tablets  cup regular juice/soda 2 tablespoons raisins 4 teaspoons sugar 1 tablespoon honey Recheck blood glucose in 15 mins and repeat above if still symptomatic/blood glucose <100.  RECOMMENDATIONS TO REDUCE YOUR RISK OF DIABETIC COMPLICATIONS: * Take your prescribed  MEDICATION(S) * Follow a DIABETIC diet: Complex carbs, fiber rich foods, (monounsaturated and polyunsaturated) fats * AVOID saturated/trans fats, high fat foods, >2,300 mg salt per day. * EXERCISE at least 5 times a week for 30 minutes or preferably daily.  * DO NOT SMOKE OR DRINK more than 1 drink a day. * Check your FEET every day. Do not wear tightfitting shoes. Contact us if you develop an ulcer * See your EYE doctor once a year or more if needed * Get a FLU shot once a year * Get a PNEUMONIA vaccine once before and once after age 9 years  GOALS:  * Your Hemoglobin A1c of <7%  * fasting sugars need to be 80-130 * after meals sugars need to be <180 (2h after you start eating) * Your Systolic BP should be 263 or lower  * Your Diastolic BP should be 80 or lower  * Your HDL (Good Cholesterol) should be 40 or higher  * Your LDL (Bad Cholesterol) should be ideally <70. * Your Triglycerides should be 150 or lower  * Your Urine microalbumin (kidney function) should be <30 * Your Body Mass Index should be 25 or lower   Please consider the following ways to cut down carbs and fat and increase fiber and micronutrients in your diet: - substitute whole grain for white bread or pasta - substitute brown rice for white rice - substitute 90-calorie flat bread pieces for slices of bread when possible - substitute sweet potatoes or yams for white potatoes - substitute humus for margarine - substitute tofu for cheese when possible - substitute almond  or rice milk for regular milk (would not drink soy milk daily due to concern for soy estrogen influence on breast cancer risk) - substitute dark chocolate for other sweets when possible - substitute water - can add lemon or orange slices for taste - for diet sodas (artificial sweeteners will trick your body that you can eat sweets without getting calories and will lead you to overeating and weight gain in the long run) - do not skip breakfast or other  meals (this will slow down the metabolism and will result in more weight gain over time)  - can try smoothies made from fruit and almond/rice milk in am instead of regular breakfast - can also try old-fashioned (not instant) oatmeal made with almond/rice milk in am - order the dressing on the side when eating salad at a restaurant (pour less than half of the dressing on the salad) - eat as little meat as possible - can try juicing, but should not forget that juicing will get rid of the fiber, so would alternate with eating raw veg./fruits or drinking smoothies - use as little oil as possible, even when using olive oil - can dress a salad with a mix of balsamic vinegar and lemon juice, for e.g. - use agave nectar, stevia sugar, or regular sugar rather than artificial sweateners - steam or broil/roast veggies  - snack on veggies/fruit/nuts (unsalted, preferably) when possible, rather than processed foods - reduce or eliminate aspartame in diet (it is in diet sodas, chewing gum, etc) Read the labels!  Try to read Dr. Janene Harvey book: "Program for Reversing Diabetes" for other ideas for healthy eating.

## 2022-02-10 ENCOUNTER — Other Ambulatory Visit: Payer: Self-pay | Admitting: Bariatrics

## 2022-02-10 DIAGNOSIS — E1169 Type 2 diabetes mellitus with other specified complication: Secondary | ICD-10-CM

## 2022-02-15 ENCOUNTER — Ambulatory Visit (INDEPENDENT_AMBULATORY_CARE_PROVIDER_SITE_OTHER): Payer: Medicare Other | Admitting: Bariatrics

## 2022-02-15 ENCOUNTER — Encounter: Payer: Self-pay | Admitting: Bariatrics

## 2022-02-15 VITALS — BP 151/69 | HR 60 | Temp 97.9°F | Ht 65.0 in | Wt 207.0 lb

## 2022-02-15 DIAGNOSIS — Z7985 Long-term (current) use of injectable non-insulin antidiabetic drugs: Secondary | ICD-10-CM

## 2022-02-15 DIAGNOSIS — E782 Mixed hyperlipidemia: Secondary | ICD-10-CM | POA: Diagnosis not present

## 2022-02-15 DIAGNOSIS — Z6834 Body mass index (BMI) 34.0-34.9, adult: Secondary | ICD-10-CM

## 2022-02-15 DIAGNOSIS — E1169 Type 2 diabetes mellitus with other specified complication: Secondary | ICD-10-CM

## 2022-02-15 DIAGNOSIS — E669 Obesity, unspecified: Secondary | ICD-10-CM

## 2022-02-16 ENCOUNTER — Telehealth (INDEPENDENT_AMBULATORY_CARE_PROVIDER_SITE_OTHER): Payer: Self-pay | Admitting: Bariatrics

## 2022-02-16 NOTE — Telephone Encounter (Signed)
Patient needs Trulicity sent to her pharmacy. Pt saw Dr. Owens Shark on yesterday. The RX the pharmacy received only states insulin. Pt needs Trulicity sent to her pharmacy. Please call pt at the number on file.       [facility].

## 2022-02-17 ENCOUNTER — Other Ambulatory Visit: Payer: Self-pay | Admitting: Bariatrics

## 2022-02-17 ENCOUNTER — Encounter: Payer: Self-pay | Admitting: Bariatrics

## 2022-02-17 ENCOUNTER — Ambulatory Visit
Admission: RE | Admit: 2022-02-17 | Discharge: 2022-02-17 | Disposition: A | Discharge status: Home / Self Care | Payer: Medicare Other | Source: Ambulatory Visit | Attending: Internal Medicine | Admitting: Internal Medicine

## 2022-02-17 DIAGNOSIS — E1169 Type 2 diabetes mellitus with other specified complication: Secondary | ICD-10-CM

## 2022-02-17 DIAGNOSIS — E042 Nontoxic multinodular goiter: Secondary | ICD-10-CM

## 2022-02-20 NOTE — Progress Notes (Signed)
[place] Phone: [phone] Subjective:   Jessica [name], am serving as a scribe for Dr. Hulan Saas.  I'm seeing this patient by the request  of:  [name], MD  CC: Bilateral knee pain  BWG:[id number]  01/26/2022 For the low back pain and the neck pain patient did respond extremely well though to the Zanaflex and would like a refill.  Currently warned of potential side effects.  Patient will monitor for this.  Follow-up again in 6 to 8 weeks  Known arthritic changes of the neck as well.  Discussed which activities to do and which ones to avoid, increase activity slowly.  Follow-up again in 6 to 8 weeks.  Patient wants to avoid any other steroids at the moment.  Patient would like to consider viscosupplementation again.  Patient will increase activity slowly otherwise.  Follow-up again in 6 to 8 weeks.  Update 02/21/2022 Jessica [name] is a 71 y.o. female coming in with complaint of B knee, neck and low back pain. Durolane approved for B knee. Patient states that knee pain has increased.   Neck pain has been doing ok but having intermittent pain in lumbar spine and R ankle. [name] pattern to her pain.        Past Medical History:  Diagnosis Date   Anxiety    Arthritis    bilateral knees   Asthma    childhood   Autonomic neuropathy    diabetic   Cataract    Celiac disease    Constipation    Depression    Diabetes mellitus    type II, Hemoglobin A1C 9.9 10/05/2011   Diabetic neuropathy ([place])    Diverticulosis    Fatty liver    Gastroparesis    GERD (gastroesophageal reflux disease)    Hyperlipidemia    Hypertension    IBS (irritable bowel syndrome)    Kidney stones    Personal history of colonic polyps 03/2010   hyperplastic   Shingles 2021   Past Surgical History:  Procedure Laterality Date   APPENDECTOMY     CATARACT EXTRACTION Right 04/29/2018   CATARACT EXTRACTION Left 03/2018    COLONOSCOPY     DILATATION & CURRETTAGE/HYSTEROSCOPY WITH RESECTOCOPE N/A 03/24/2013   Procedure: DILATATION & CURETTAGE, HYSTEROSCOPY WITH RESECTION;  Surgeon: Olga Millers, MD;  Location: [facility];  Service: Gynecology;  Laterality: N/A;   HYSTEROSCOPY WITH D & C N/A 11/08/2015   Procedure: DILATATION AND CURETTAGE /HYSTEROSCOPY;  Surgeon: Olga Millers, MD;  Location: [facility];  Service: Gynecology;  Laterality: N/A;   left breast cyst removal  2000   LEFT HEART CATH AND CORONARY ANGIOGRAPHY N/A 10/31/2018   Procedure: LEFT HEART CATH AND CORONARY ANGIOGRAPHY;  Surgeon: Jettie Booze, MD;  Location: [facility];  Service: Cardiovascular;  Laterality: N/A;   TUBAL LIGATION     Social History   Socioeconomic History   Marital status: Married    Spouse name: Devar Galano   Number of children: 2   Years of education: Not on file   Highest education level: Not on file  Occupational History   Occupation: Scientist, 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   Occupation: retired  Tobacco Use   Smoking status: Never   Smokeless tobacco: Never  Vaping Use   Vaping Use: Never used  Substance and Sexual Activity   Alcohol use: [name]  Drug use: [name]   Sexual activity: Yes    Birth control/protection: Post-menopausal, Surgical  Other Topics Concern   Not on file  Social History Narrative   Not on file   Social Determinants of Health   Financial Resource Strain: Low Risk  (02/24/2021)   Overall Financial Resource Strain (CARDIA)    Difficulty of Paying Living Expenses: Not hard at all  Food Insecurity: [name] Food Insecurity (02/24/2021)   Hunger Vital Sign    Worried About Running Out of Food in the Last Year: Never true    Ran Out of Food in the Last Year: Never true  Transportation Needs: [name] Transportation Needs (02/24/2021)   PRAPARE - Hydrologist (Medical): [name]    Lack of Transportation (Non-Medical): [name]  Physical  Activity: Sufficiently Active (02/24/2021)   Exercise Vital Sign    Days of Exercise per Week: 7 days    Minutes of Exercise per Session: 60 min  Stress: Stress Concern Present (02/24/2021)   [place]    Feeling of Stress : To some extent  Social Connections: Moderately Integrated (02/24/2021)   Social Connection and Isolation Panel [NHANES]    Frequency of Communication with Friends and Family: More than three times a week    Frequency of Social Gatherings with Friends and Family: Once a week    Attends Religious Services: More than 4 times per year    Active Member of [employer] or Organizations: [name]    Attends Music therapist: Never    Marital Status: Married   Allergies  Allergen Reactions   Codeine     Other reaction(s): vomiting   Gluten Meal Other (See Comments)    [name] wheat or soy   Metronidazole     Other reaction(s): vomiting   Nsaids Nausea And Vomiting and Other (See Comments)    Cannot tolerate large doses  Other reaction(s): vomiting Other reaction(s): vomiting   Soy Allergy    Wheat Bran    Family History  Problem Relation Age of Onset   Hypertension Mother    Colon cancer Sister        dx in her early 72s   Diabetes Sister    Endometrial cancer Sister    Hypertension Brother    Other Father        2 collapsed lungs   COPD Father    Diabetes Father    Heart attack Father    Heart failure Father    High blood pressure Father    Colon polyps Neg Hx    Esophageal cancer Neg Hx    Stomach cancer Neg Hx     Current Outpatient Medications (Endocrine & Metabolic):    insulin NPH-regular Human (HUMULIN 70/30) (70-30) 100 UNIT/ML injection, INJECT 32 UNITS WITH BREAKFAST, AND 14 UNITS WITH SUPPER.   TRULICITY 7.67 MC/9.4BS SOPN, INJECT 0.75 MG SUBCUTANEOUSLY ONE TIME PER WEEK  Current Outpatient Medications (Cardiovascular):    atorvastatin (LIPITOR) 40 MG tablet, TAKE 1 TABLET BY  MOUTH EVERY DAY   furosemide (LASIX) 20 MG tablet, TAKE 1 TABLET (20 MG TOTAL) BY MOUTH DAILY AS NEEDED FOR FLUID OR EDEMA.   olmesartan (BENICAR) 40 MG tablet, TAKE 1 TABLET BY MOUTH EVERY DAY  Current Outpatient Medications (Respiratory):    albuterol (VENTOLIN HFA) 108 (90 Base) MCG/ACT inhaler, TAKE 2 PUFFS BY MOUTH EVERY 6 HOURS AS NEEDED FOR WHEEZE OR SHORTNESS OF BREATH   fluticasone (FLONASE) 50  MCG/ACT nasal spray, Place 1 spray into both nostrils daily.  Current Outpatient Medications (Analgesics):    aspirin EC 81 MG tablet, Take 81 mg by mouth daily. Swallow whole.   ibuprofen (ADVIL) 200 MG tablet, Take 200 mg by mouth daily.   Current Outpatient Medications (Other):    BD INSULIN SYRINGE U/F 31G X 5/16" 1 ML MISC, USE UP TO 3 TIMES A DAY   dexlansoprazole (DEXILANT) 60 MG capsule, TAKE 1 CAPSULE BY MOUTH EVERY DAY   DULoxetine (CYMBALTA) 20 MG capsule, Take 1 capsule (20 mg total) by mouth daily.   gabapentin (NEURONTIN) 600 MG tablet, TAKE 1 TABLET IN THE MORNING AND 2 TABLETS AT BEDTIME   glucose blood (ONETOUCH ULTRA) test strip, USE 2 TIMES DAILY. AND LANCETS 2/DAY   ketoconazole (NIZORAL) 2 % cream, APPLY TWICE A DAY FOR 2 WEEKS AS NEEDED FOR RASH   ondansetron (ZOFRAN ODT) 4 MG disintegrating tablet, Take 1 tablet (4 mg total) by mouth every 8 (eight) hours as needed for nausea or vomiting.   polyethylene glycol powder ([employer] PURELAX) 17 GM/SCOOP powder, DISSOLVE 1 CAPFUL IN AT LEAST 8 OUNCES WATER/JUICE AND DRINK TWICE DAILY   sucralfate (CARAFATE) 1 GM/10ML suspension, Take 10 mLs (1 g total) by mouth 4 (four) times daily.   tiZANidine (ZANAFLEX) 2 MG tablet, Take 1 tablet (2 mg total) by mouth at bedtime.   valACYclovir (VALTREX) 500 MG tablet, TAKE 1 TABLET BY MOUTH EVERY DAY   Vitamin D, Ergocalciferol, (DRISDOL) 1.25 MG (50000 UNIT) CAPS capsule, Take 1 capsule (50,000 Units total) by mouth every 7 (seven) days.   Reviewed prior external information including notes  and imaging from  primary care provider As well as notes that were available from care everywhere and other healthcare systems.  Past medical history, social, surgical and family history all reviewed in electronic medical record.  [name] pertanent information unless stated regarding to the chief complaint.   Review of Systems:  [name] headache, visual changes, nausea, vomiting, diarrhea, constipation, dizziness, abdominal pain, skin rash, fevers, chills, night sweats, weight loss, swollen lymph nodes, body aches, joint swelling, chest pain, shortness of breath, mood changes. POSITIVE muscle aches  Objective  Blood pressure 132/68, pulse 61, height 5' 5"  (1.651 m), weight 215 lb (97.5 kg), SpO2 99 %.   General: [name] apparent distress alert and oriented x3 mood and affect normal, dressed appropriately.  HEENT: Pupils equal, extraocular movements intact  Respiratory: Patient's speak in full sentences and does not appear short of breath  Cardiovascular: 1+ lower extremity edema, non tender, [name] erythema  Patient does have arthritic changes of the knees bilaterally.  Instability of the knees bilaterally.  Trace effusion noted.  After informed written and verbal consent, patient was seated on exam table. Right knee was prepped with alcohol swab and utilizing anterolateral approach, patient's right knee space was injected with 60 mg per 3 mL of Durolane (sodium hyaluronate) in a prefilled syringe was injected easily into the knee through a 22-gauge needle..Patient tolerated the procedure well without immediate complications.  After informed written and verbal consent, patient was seated on exam table. Left knee was prepped with alcohol swab and utilizing anterolateral approach, patient's left knee space was injected with 60 mg per 3 mL of Durolane (sodium hyaluronate) in a prefilled syringe was injected easily into the knee through a 22-gauge needle..Patient tolerated the procedure well without immediate  complications.    Impression and Recommendations:     The above documentation has been reviewed  and is accurate and complete Lyndal Pulley, DO

## 2022-02-20 NOTE — Progress Notes (Signed)
Chief Complaint:   OBESITY Jessica [name] is here to discuss her progress with her obesity treatment plan along with follow-up of her obesity related diagnoses. [name] is on the Category 3 Plan and states she is following her eating plan approximately 95% of the time. Camyah states she is doing 0 minutes 0 times per week.  Today's visit was #: 47 Starting weight: 246 lbs Starting date: 03/12/2019 Today's weight: 207 lbs Today's date: 02/15/2022 Total lbs lost to date: 39 Total lbs lost since last in-office visit: 0  Interim History: Jessica [name]'s weight remains the same.  She is having allergy symptoms.  Subjective:   1. Type 2 diabetes mellitus with obesity ([place]) [name]'s is taking Trulicity as directed.  She notes decrease in nausea.  Her last A1c was 6.6.  2. Combined hyperlipidemia [name] is trying to eat better.  Assessment/Plan:   1. Type 2 diabetes mellitus with obesity ([place]) Jessica [name] will continue Trulicity 0.62 mg once weekly, and we will refill for 1 month.  We will try to get up to a higher dose of Trulicity at her next visit and lower her insulin  2. Combined hyperlipidemia Handout on Healthy Fats were given to the patient today.   3. Obesity, Current BMI 34.4 Jessica [name] is currently in the action stage of change. As such, her goal is to continue with weight loss efforts. She has agreed to the Category 3 Plan and keeping a food journal and adhering to recommended goals of 1500 calories and 90 grams of protein.   Meal planning and intentional eating were discussed.  Exercise goals: No exercise has been prescribed at this time.  Behavioral modification strategies: increasing lean protein intake, decreasing simple carbohydrates, increasing vegetables, increasing water intake, decreasing eating out, no skipping meals, meal planning and cooking strategies, keeping healthy foods in the home, and planning for success.  Jessica [name] has agreed to follow-up with our clinic in 4 weeks. She  was informed of the importance of frequent follow-up visits to maximize her success with intensive lifestyle modifications for her multiple health conditions.   Objective:   Blood pressure (!) 151/69, pulse 60, temperature 97.9 F (36.6 C), height 5' 5"  (1.651 m), weight 207 lb (93.9 kg), SpO2 99 %. Body mass index is 34.45 kg/m.  General: Cooperative, alert, well developed, in no acute distress. HEENT: Conjunctivae and lids unremarkable. Cardiovascular: Regular rhythm.  Lungs: Normal work of breathing. Neurologic: No focal deficits.   Lab Results  Component Value Date   CREATININE 0.95 06/09/2021   BUN 23 06/09/2021   NA 138 06/09/2021   K 4.6 06/09/2021   CL 102 06/09/2021   CO2 33 (H) 06/09/2021   Lab Results  Component Value Date   ALT 14 06/09/2021   AST 16 06/09/2021   ALKPHOS 69 06/09/2021   BILITOT 0.7 06/09/2021   Lab Results  Component Value Date   HGBA1C 6.6 (A) 02/08/2022   HGBA1C 6.3 (A) 10/19/2021   HGBA1C 7.7 (A) 04/13/2021   HGBA1C 8.2 (A) 01/12/2021   HGBA1C 8.1 (A) 08/12/2020   No results found for: "INSULIN" Lab Results  Component Value Date   TSH 0.91 04/13/2021   Lab Results  Component Value Date   CHOL 135 04/13/2021   HDL 59.70 04/13/2021   LDLCALC 65 04/13/2021   LDLDIRECT 143.2 11/23/2006   TRIG 50.0 04/13/2021   CHOLHDL 2 04/13/2021   Lab Results  Component Value Date   VD25OH 44.70 04/13/2021   VD25OH 85.0 09/07/2020   VD25OH  61.6 03/11/2020   Lab Results  Component Value Date   WBC 3.4 (L) 06/09/2021   HGB 14.7 06/09/2021   HCT 43.7 06/09/2021   MCV 99.0 06/09/2021   PLT 214.0 06/09/2021   Lab Results  Component Value Date   IRON 113 09/07/2020   TIBC 276 09/07/2020   FERRITIN 149 09/07/2020   Attestation Statements:   Reviewed by clinician on day of visit: allergies, medications, problem list, medical history, surgical history, family history, social history, and previous encounter notes.   Wilhemena Durie, am  acting as Location manager for [employer], DO.  I have reviewed the above documentation for accuracy and completeness, and I agree with the above. [name], DO

## 2022-02-20 NOTE — Telephone Encounter (Signed)
Left message for patient to return call.

## 2022-02-21 ENCOUNTER — Ambulatory Visit (INDEPENDENT_AMBULATORY_CARE_PROVIDER_SITE_OTHER): Payer: Medicare Other | Admitting: Family Medicine

## 2022-02-21 VITALS — BP 132/68 | HR 61 | Ht 65.0 in | Wt 215.0 lb

## 2022-02-21 DIAGNOSIS — M5030 Other cervical disc degeneration, unspecified cervical region: Secondary | ICD-10-CM

## 2022-02-21 DIAGNOSIS — M170 Bilateral primary osteoarthritis of knee: Secondary | ICD-10-CM

## 2022-02-21 MED ORDER — DULOXETINE HCL 20 MG PO CPEP: 20.0000 mg | ORAL_CAPSULE | Freq: Every day | ORAL | 1 refills | Status: DC

## 2022-02-21 MED ORDER — SODIUM HYALURONATE 60 MG/3ML IX PRSY
120.0000 mg | PREFILLED_SYRINGE | Freq: Once | INTRA_ARTICULAR | Status: AC
  Administered 2022-02-21: 120 mg via INTRA_ARTICULAR

## 2022-02-21 NOTE — Assessment & Plan Note (Signed)
Viscosupplementation given bilaterally.  Chronic problem with worsening symptoms.  Social determinants of health including patient's other comorbidities including diabetes discussed with icing regimen and home exercises.  Discussed which activities to do and which ones to avoid.  Increase activity slowly over the course the next several weeks.  Follow-up with me again in 6 weeks.

## 2022-02-21 NOTE — Assessment & Plan Note (Signed)
Significant arthritic changes of the neck.  Patient does have more of a polyarthralgia.  Started patient on a very low-dose of Cymbalta 20 mg.  Follow-up with me again in 6 to 8 weeks

## 2022-02-21 NOTE — Patient Instructions (Addendum)
Gel injections in both knees Cymbalta 69m prescribed Good to see you! See you again in 2 months

## 2022-02-28 ENCOUNTER — Other Ambulatory Visit: Payer: Self-pay | Admitting: Family Medicine

## 2022-03-06 NOTE — Progress Notes (Deleted)
Chronic Care Management Pharmacy Note  03/06/2022 Name:  Jessica [name] MRN:  [id number] DOB:  [birth year]  Summary: Was not able to tolerate Lexapro.  She says it caused nausea after one dose and she stopped.  I sense there is still some depression with her ongoing pain and taking care of her husband.  Not home monitoring glucose currently.  Trulicity up to 6.5EK weekly.  Has not recheck A1c since starting.   Recommended A1c recheck, water aerobics. FU 6 months   Subjective: Jessica [name] is an 71 y.o. year old female who is a primary patient of [name], MD.  The CCM team was consulted for assistance with disease management and care coordination needs.    Engaged with patient by telephone for follow up visit in response to provider referral for pharmacy case management and/or care coordination services.   Consent to Services:  The patient was given the following information about Chronic Care Management services today, agreed to services, and gave verbal consent: 1. CCM service includes personalized support from designated clinical staff supervised by the primary care provider, including individualized plan of care and coordination with other care providers 2. 24/7 contact phone numbers for assistance for urgent and routine care needs. 3. Service will only be billed when office clinical staff spend 20 minutes or more in a month to coordinate care. 4. Only one practitioner may furnish and bill the service in a calendar month. 5.The patient may stop CCM services at any time (effective at the end of the month) by phone call to the office staff. 6. The patient will be responsible for cost sharing (co-pay) of up to 20% of the service fee (after annual deductible is met). Patient agreed to services and consent obtained.  Patient Care Team: [name], MD as PCP - General (Family Medicine) Jettie Booze, MD as PCP - Cardiology (Cardiology) Olga Millers, MD as Attending  Physician (Obstetrics and Gynecology) Renato Shin, MD (Inactive) as Consulting Physician (Endocrinology) Pyrtle, Lajuan Lines, MD as Consulting Physician (Gastroenterology) Stephannie Li, [place] [facility] as Consulting Physician (Ophthalmology) Garrel Ridgel, DPM as Consulting Physician (Podiatry) Edythe Clarity, [facility] as Pharmacist (Pharmacist)  Recent office visits:  04/13/2021 OV (PCP) [name], MD;  recommend starting Lexapro daily   Recent consult visits:  03/10/2021 OV (MWM) [name] A, DO; no medication changes indicated.   02/09/2021 OV (MWM) [name] A, DO; no medication changes indicated.   02/09/2021 (Patient Message with Endocrinology) Renato Shin, MD; Increase Ozempic to 0.5 mg   01/12/2021 OV (MWM) [name] A, DO; no medication changes indicated.   12/28/2020 VV (Endocrinology) Renato Shin, MD; Please change the insulin to 40 units with breakfast, and 8 units with supper.   check your blood sugar twice a day.  vary the time of day when you check, between before the 3 meals, and at bedtime.  also check if you have symptoms of your blood sugar being too high or too low.  please keep a record of the readings and bring it to your next appointment here (or you can bring the meter itself).     12/01/2020 OV (MWM) [name] A, DO; no medication changes indicated.   11/18/2020 OV (Podiatry) [place], Max T, DPM; no medication changes indicated.   11/03/2020 OV (MWM) [name] A, DO; no medication changes indicated.   Hospital visits:  None in previous 6 months   Objective:  Lab Results  Component Value Date  CREATININE 0.95 06/09/2021   BUN 23 06/09/2021   GFR 60.62 06/09/2021   GFRNONAA 61 03/11/2020   GFRAA 70 03/11/2020   NA 138 06/09/2021   K 4.6 06/09/2021   CALCIUM 9.7 06/09/2021   CO2 33 (H) 06/09/2021   GLUCOSE 154 (H) 06/09/2021    Lab Results  Component Value Date/Time   HGBA1C 6.6 (A) 02/08/2022 11:43 AM   HGBA1C 6.3 (A) 10/19/2021 01:45  PM   HGBA1C 6.7 (H) 07/24/2019 04:30 PM   HGBA1C 7.1 (H) 07/17/2017 04:13 PM   GFR 60.62 06/09/2021 10:10 AM   GFR 58.47 (L) 04/13/2021 11:49 AM   MICROALBUR <0.7 10/19/2021 02:04 PM   MICROALBUR 0.7 10/25/2017 02:14 PM    Last diabetic Eye exam:  Lab Results  Component Value Date/Time   HMDIABEYEEXA Retinopathy (A) 04/05/2020 12:00 AM    Last diabetic Foot exam: No results found for: "HMDIABFOOTEX"   Lab Results  Component Value Date   CHOL 135 04/13/2021   HDL 59.70 04/13/2021   LDLCALC 65 04/13/2021   LDLDIRECT 143.2 11/23/2006   TRIG 50.0 04/13/2021   CHOLHDL 2 04/13/2021       Latest Ref Rng & Units 06/09/2021   10:10 AM 04/13/2021   11:49 AM 10/07/2020   11:35 AM  Hepatic Function  Total Protein 6.0 - 8.3 g/dL 6.9  6.4  6.9   Albumin 3.5 - 5.2 g/dL 3.9  3.8  4.0   AST 0 - 37 U/L _0 ALT 0 - 35 U/L _1 Alk Phosphatase 39 - 117 U/L 69  71  72   Total Bilirubin 0.2 - 1.2 mg/dL 0.7  0.5  0.5     Lab Results  Component Value Date/Time   TSH 0.91 04/13/2021 11:49 AM   TSH 1.320 09/07/2020 12:31 PM   FREET4 1.18 09/07/2020 12:31 PM       Latest Ref Rng & Units 06/09/2021   10:10 AM 04/13/2021   11:49 AM 09/07/2020   12:31 PM  CBC  WBC 4.0 - 10.5 K/uL 3.4  3.3    Hemoglobin 12.0 - 15.0 g/dL 14.7  13.4    Hematocrit 36.0 - 46.0 % 43.7  40.2  42.6   Platelets 150.0 - 400.0 K/uL 214.0  193.0      Lab Results  Component Value Date/Time   VD25OH 44.70 04/13/2021 11:49 AM   VD25OH 85.0 09/07/2020 12:31 PM   VD25OH 61.6 03/11/2020 10:38 AM   VD25OH 29.57 (L) 11/03/2015 12:10 PM    Clinical ASCVD: No  The 10-year ASCVD risk score (Arnett DK, et al., 2019) is: 21.3%   Values used to calculate the score:     Age: 71 years     Sex: Female     Is Non-Hispanic African American: Yes     Diabetic: Yes     Tobacco smoker: No     Systolic Blood Pressure: 818 mmHg     Is BP treated: Yes     HDL Cholesterol: 59.7 mg/dL     Total Cholesterol: 135  mg/dL       12/05/2021    1:38 PM 10/19/2021    1:43 PM 04/13/2021   11:42 AM  Depression screen PHQ 2/9  Decreased Interest 0 2 2  Down, Depressed, Hopeless 0 1 1  PHQ - 2 Score 0 3 3  Altered sleeping 0 2 3  Tired, decreased energy 0 3 2  Change in appetite  0 1 1  Feeling bad or failure about yourself  0 0 0  Trouble concentrating 0 0 0  Moving slowly or fidgety/restless 0 0 1  Suicidal thoughts 0 0 0  PHQ-9 Score 0 9 10  Difficult doing work/chores Not difficult at all Somewhat difficult Somewhat difficult     Social History   Tobacco Use  Smoking Status Never  Smokeless Tobacco Never   BP Readings from Last 3 Encounters:  02/21/22 132/68  02/15/22 (!) 151/69  02/08/22 128/68   Pulse Readings from Last 3 Encounters:  02/21/22 61  02/15/22 60  02/08/22 87   Wt Readings from Last 3 Encounters:  02/21/22 215 lb (97.5 kg)  02/15/22 207 lb (93.9 kg)  02/08/22 213 lb 12.8 oz (97 kg)   BMI Readings from Last 3 Encounters:  02/21/22 35.78 kg/m  02/15/22 34.45 kg/m  02/08/22 35.58 kg/m    Assessment/Interventions: Review of patient past medical history, allergies, medications, health status, including review of consultants reports, laboratory and other test data, was performed as part of comprehensive evaluation and provision of chronic care management services.   SDOH:  (Social Determinants of Health) assessments and interventions performed: Yes SDOH Interventions    Flowsheet Row Office Visit from 10/19/2021 in [place]  SDOH Interventions   Depression Interventions/Treatment  Currently on Treatment      Financial Resource Strain: Low Risk  (02/24/2021)   Overall Financial Resource Strain (CARDIA)    Difficulty of Paying Living Expenses: Not hard at all    [place]: No Food Insecurity (02/24/2021)  Housing: Low Risk  (02/24/2021)  Transportation Needs: No Transportation Needs (02/24/2021)  Depression  (PHQ2-9): Low Risk  (12/05/2021)  Recent Concern: Depression (PHQ2-9) - Medium Risk (10/19/2021)  Financial Resource Strain: Low Risk  (02/24/2021)  Physical Activity: Sufficiently Active (02/24/2021)  Social Connections: Moderately Integrated (02/24/2021)  Stress: Stress Concern Present (02/24/2021)  Tobacco Use: Low Risk  (02/15/2022)    CCM Care Plan  Allergies  Allergen Reactions   Codeine     Other reaction(s): vomiting   Gluten Meal Other (See Comments)    No wheat or soy   Metronidazole     Other reaction(s): vomiting   Nsaids Nausea And Vomiting and Other (See Comments)    Cannot tolerate large doses  Other reaction(s): vomiting Other reaction(s): vomiting   Soy Allergy    Wheat Bran     Medications Reviewed Today     Reviewed by Nettie Elm, CMA (Certified Medical Assistant) on 02/15/22 at 1242  Med List Status: <None>   Medication Order Taking? Sig Documenting Provider Last Dose Status Informant  albuterol (VENTOLIN HFA) 108 (90 Base) MCG/ACT inhaler [id number]  TAKE 2 PUFFS BY MOUTH EVERY 6 HOURS AS NEEDED FOR WHEEZE OR SHORTNESS OF BREATH [name], MD  Active   aspirin EC 81 MG tablet [id number]  Take 81 mg by mouth daily. Swallow whole. [provider]  Active   atorvastatin (LIPITOR) 40 MG tablet [id number]  TAKE 1 TABLET BY MOUTH EVERY DAY [name], MD  Active   BD INSULIN SYRINGE U/F 31G X 5/16" 1 ML MISC [id number]  USE UP TO 3 TIMES A Marella Chimes, MD  Active   dexlansoprazole (DEXILANT) 60 MG capsule [id number]  TAKE 1 CAPSULE BY MOUTH EVERY DAY Pyrtle, Lajuan Lines, MD  Active   Dulaglutide (TRULICITY) 1.42 LT/5.3UY SOPN [id number]  Inject 0.75 mg into the skin once a week.  [name] A, DO  Active   fluticasone (FLONASE) 50 MCG/ACT nasal spray [id number]  Place 1 spray into both nostrils daily. [name], MD  Active   furosemide (LASIX) 20 MG tablet [id number]  TAKE 1 TABLET (20 MG TOTAL) BY MOUTH DAILY AS NEEDED FOR FLUID OR EDEMA.  [name], MD  Active   gabapentin (NEURONTIN) 600 MG tablet [id number]  TAKE 1 TABLET IN THE MORNING AND 2 TABLETS AT BEDTIME Hyatt, Max T, DPM  Active   glucose blood (ONETOUCH ULTRA) test strip [id number]  USE 2 TIMES DAILY. AND LANCETS 2/DAY Philemon Kingdom, MD  Active   ibuprofen (ADVIL) 200 MG tablet [id number]  Take 200 mg by mouth daily. [provider]  Active Self  insulin NPH-regular Human (HUMULIN 70/30) (70-30) 100 UNIT/ML injection [id number]  INJECT 32 UNITS WITH BREAKFAST, AND 14 UNITS WITH SUPPER. Philemon Kingdom, MD  Active   ketoconazole (NIZORAL) 2 % cream [id number]  APPLY TWICE A DAY FOR 2 WEEKS AS NEEDED FOR Arlee Muslim, MD  Active   olmesartan (BENICAR) 40 MG tablet [id number]  TAKE 1 TABLET BY MOUTH EVERY DAY [name], MD  Active   ondansetron (ZOFRAN ODT) 4 MG disintegrating tablet [id number]  Take 1 tablet (4 mg total) by mouth every 8 (eight) hours as needed for nausea or vomiting. [name] A, DO  Active   polyethylene glycol powder ([facility] PURELAX) 17 GM/SCOOP powder [id number]  DISSOLVE 1 CAPFUL IN AT LEAST 8 OUNCES WATER/JUICE AND DRINK TWICE DAILY Pyrtle, Lajuan Lines, MD  Active   sucralfate (CARAFATE) 1 GM/10ML suspension [id number]  Take 10 mLs (1 g total) by mouth 4 (four) times daily. Jerene Bears, MD  Active   tiZANidine (ZANAFLEX) 2 MG tablet [id number]  Take 1 tablet (2 mg total) by mouth at bedtime. Lyndal Pulley, DO  Active   valACYclovir (VALTREX) 500 MG tablet [id number]  TAKE 1 TABLET BY MOUTH EVERY DAY [name], MD  Active   Vitamin D, Ergocalciferol, (DRISDOL) 1.25 MG (50000 UNIT) CAPS capsule [id number]  Take 1 capsule (50,000 Units total) by mouth every 7 (seven) days. 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[place]) 04/05/2020   Bilateral lower extremity edema 01/20/2020   Thyroid nodule 10/28/2019   Abnormal stress test    Family history of malignant neoplasm of endometrium 10/08/2018   Celiac disease 02/14/2018   Osteopenia 02/14/2018   Mild intermittent asthma 02/14/2018   Diabetic neuropathy associated with type 2 diabetes mellitus ([place]) 02/14/2018   Class 2 obesity due to excess calories with body mass index (BMI) of 37.0 to 37.9 in adult 02/14/2018   Fibroma of tongue 06/06/2016   Renal calculi 01/13/2016   Degenerative arthritis of knee, bilateral 10/29/2015   Trigger point of right shoulder region 08/02/2015   Degenerative cervical disc 07/27/2015   OSA (obstructive sleep apnea) 02/28/2011   Constipation, slow transit 09/23/2010   Type 2 diabetes mellitus with complication, with long-term current use of insulin ([place]) 09/23/2010   FH: colon cancer 09/23/2010   Personal history  of colonic polyps 09/23/2010   Combined hyperlipidemia associated with type 2 diabetes mellitus ([place]) 01/29/2009   Diabetic gastroparesis ([place]) 02/27/2007   Diabetic autonomic neuropathy associated with type 2 diabetes mellitus ([place]) 10/22/2006   Essential hypertension 10/22/2006    Immunization History  Administered Date(s) Administered   Fluad Quad(high Dose 65+) 01/16/2019, 01/20/2020   Influenza,inj,Quad PF,6+ Mos 02/14/2018   Influenza-Unspecified 12/30/2020   PFIZER Comirnaty(Gray Top)Covid-19 Tri-Sucrose Vaccine 09/02/2020   PFIZER(Purple Top)SARS-COV-2 Vaccination 07/03/2019, 08/06/2019   Pneumococcal Conjugate-13 11/03/2015   Pneumococcal Polysaccharide-23 08/17/2010, 02/14/2018   Td 12/31/2002   Tdap 10/25/2017   Zoster Recombinat (Shingrix) 11/05/2018, 01/28/2019    Conditions to be addressed/monitored:  HTN, OSA, Asthma, Type II  DM w/ neuropathy, HLD, Osteopenia, Obesity  There are no care plans that you recently modified to display for this patient.      Medication Assistance: Application for Dexilant  medication assistance program. in process.  Anticipated assistance start date unknown.  See plan of care for additional detail.  Compliance/Adherence/Medication fill history: Care Gaps: N/A  Star-Rating Drugs: Atorvastatin 40 mg last filled 03/29/2021 90 DS Ozempic 0.25-0.5 mg last filled 03/11/2021 28 DS Humulin 70/30 last filled 04/04/2021 30 DS Olmesartan 40 mg last filled 03/30/2021 30 DS  Patient's preferred pharmacy is:  [id number] [place], [place] - [address] [place] [id number]: [phone]: [place]- [id number]: [phone]: [phone]  We discussed: Benefits of medication synchronization, packaging and delivery as well as enhanced pharmacist oversight with [employer]. Patient decided to: Continue current medication management strategy  Care Plan and Follow Up Patient Decision:  Patient agrees to Care Plan and Follow-up.  Plan: The care management team will reach out to the patient again over the next 120 days. CBeverly Milch PharmD Clinical Pharmacist  [facility]([phone]   Current Barriers:  Unable to independently afford treatment regimen Unable to achieve control of glucose   Pharmacist Clinical Goal(s):  Patient will achieve improvement in A1c as evidenced by labs through collaboration with PharmD and provider.   Interventions: 1:1 collaboration with ALeamon Arnt MD regarding development and update of comprehensive plan of care as evidenced by provider attestation and co-signature Inter-disciplinary care team collaboration (see longitudinal plan of  care) Comprehensive medication review performed; medication list updated in electronic medical record  Hypertension (BP goal <130/80) -Controlled, based on most recent in office BP -Current treatment: Olmesartan 48m-Medications previously tried: valsartan, HCTZ  -Current home readings: not checking regularly -Current dietary habits: see DM -Current exercise habits: about 1-2 hours per day, averages 6500 steps per day -Denies hypotensive/hypertensive symptoms -Educated on BP goals and benefits of medications for prevention of heart attack, stroke and kidney damage; Exercise goal of 150 minutes per week; Importance of home blood pressure monitoring; Symptoms of hypotension and importance of maintaining adequate hydration; -Counseled to monitor BP at home a few times per wek, document, and provide log at future appointments -Recommended to continue current medication Contact usKoreaf consistently elevated  Hyperlipidemia: (LDL goal < 70) -Controlled -Current treatment: Atorvastatin 4044maily -Medications previously tried: none noted  -Current dietary patterns: see DM -Current exercise habits: see HTN -Educated on Cholesterol goals;  Benefits of statin for ASCVD risk reduction; Importance of limiting foods high in cholesterol; -Recommended to continue current medication  Most recent LDL is controled, no concerns with medication at this time  Diabetes w/ neuropathy(A1c goal <7%) -Not ideally controlled -Current medications: Trulicity 3.8GY into the skin Appropriate, Query effective, ,  Humulin 70/30 40 units with breakfast and 8 units with supper Appropriate, Effective, Safe, Accessible Gabapentin 66m 1 qam and 2hs -Medications previously tried: none noted  -Current home glucose readings fasting glucose: 140 today post prandial glucose: not checking regularly -Denies hypoglycemic/hyperglycemic symptoms -Current meal patterns:  breakfast: cereal, rice chex, eggs/turkey sausage   lunch: deli meat, vegetables, pickled beets, skinny pop, 90 calorie rice krispie treat  dinner: vegetables, meat, salmon/chicken, some red meat snacks: yogurt bar, apples drinks: occasional soda, water -Current exercise: 6500 steps per day -Educated on A1c and blood sugar goals; Complications of diabetes including kidney damage, retinal damage, and cardiovascular disease; Prevention and management of hypoglycemic episodes; Benefits of routine self-monitoring of blood sugar; -Counseled to check feet daily and get yearly eye exams -Recommended to continue current medication Patient has celiac so managing diet has been complicated for her as well as tolerating increased doses of Ozempic. Recommended she continue to work on diet, future plans cold be to add low dose metformin for better glucose control.  I would try not to increase Ozempic due to tolerability issues.  Consider Trulicity so we can help with copay assistance due to easier PAP program.  Update 065/99/35Trulicity just increased to 1.51mweekly.  She has not had a chance to start this dose yet.  Dealing with the pain she has not really been checking her glucose at home.  She reports she has not missed any doses of medication she just has no been keeping up with her monitoring.  Discussed implementing some exercise maybe in the water which could possible help with her pain. Reminded patient she is due for A1c recheck.  Work to control pain then start to focus back on controlling glucose. No further changes at this time.  Celiac (Goal: Reduce symptoms) -Controlled -Current treatment  Dexilant 6049maily Caragate 1gm/69m91mn -Medications previously tried: omeprazole  -Patient on very strict diet due to this - however diet as a whole seems pretty good. -Recommended to continue current medication Assessed patient finances. Dexilant copay high - will have patient assistance started for [employer].    Asthma (Goal: Prevent  exacerbation) -Controlled -Current treatment  Albuterol HFA 90mc57mn -Medications previously tried: none noted -rarely has to use  -Recommended to continue current medication Denies any recent episodes of SOB.  Depression?? (Goal: Reduce symptoms) -Not ideally controlled -Current treatment  None noted -Medications previously tried: Wellbutrin , Lexapro (nausea) PHQ9 SCORE ONLY 04/13/2021 02/24/2021 04/05/2020  PHQ-9 Total Score 10 0 8  -Patient had never gotten prescription filled, she still has it at home.  Believes she is still having some symptoms of depression. We discussed and patient was willing to try medication.  Discussed that it would take about 2 weeks for full effect to take place.  Patient asked for a three week check in to determine efficacy of medication. Will have CMA call in 3 weeks to assess  Update 08/30/21 She was not able to tolerate Lexapro.  It made her nauseous.  She reports she is in some pain in her back that is causing her a lot of stress currently.  She is also still taking care of her husband and he is "not getting any better."       04/13/2021   11:42 AM 02/24/2021    2:33  PM 04/05/2020   10:09 AM  PHQ9 SCORE ONLY  PHQ-9 Total Score 10 0 8   PHQ-9 indicated moderate depression.  I do feel like if she controlled pain it would improve.  She is very sensitive to a lot of medications. Work to control pain, she is expecting call about her MRI soon.  If depression still exists can consider other medication alternatives. No changes a this time.  Patient Goals/Self-Care Activities Patient will:  - take medications as prescribed as evidenced by patient report and record review check glucose daily, document, and provide at future appointments check blood pressure a few times per week, document, and provide at future appointments target a minimum of 150 minutes of moderate intensity exercise weekly  Follow Up Plan: The care management team will reach out to  the patient again over the next 120 days.

## 2022-03-13 ENCOUNTER — Ambulatory Visit (INDEPENDENT_AMBULATORY_CARE_PROVIDER_SITE_OTHER): Payer: Medicare Other

## 2022-03-13 VITALS — Wt 207.0 lb

## 2022-03-13 DIAGNOSIS — Z0000 Encounter for general adult medical examination without abnormal findings: Secondary | ICD-10-CM | POA: Diagnosis not present

## 2022-03-13 NOTE — Progress Notes (Addendum)
I connected with  Jessica [name] on 03/13/22 by a audio enabled telemedicine application and verified that I am speaking with the correct person using two identifiers.  Patient Location: Home  Provider Location: Office/Clinic  I discussed the limitations of evaluation and management by telemedicine. The patient expressed understanding and agreed to proceed.   Subjective:   Jessica [name] is a 71 y.o. female who presents for Medicare Annual (Subsequent) preventive examination.  Review of Systems     Cardiac Risk Factors include: advanced age (>85mn, >>83women);hypertension;dyslipidemia;diabetes mellitus;obesity (BMI >30kg/m2)     Objective:    Today's Vitals   03/13/22 1404  Weight: 207 lb (93.9 kg)   Body mass index is 34.45 kg/m.     03/13/2022    2:14 PM 02/24/2021    2:35 PM 02/19/2020    2:37 PM 01/16/2019    3:24 PM 10/31/2018    9:21 AM 10/25/2017    1:31 PM 01/07/2016    2:24 PM  Advanced Directives  Does Patient Have a Medical Advance Directive? Yes Yes No No No No No  Type of AParamedicof AWynonaLiving will HHughestownin Chart? No - copy requested No - copy requested       Would patient like information on creating a medical advance directive?    Yes (MAU/Ambulatory/Procedural Areas - Information given) No - Patient declined  No - patient declined information    Current Medications (verified) Outpatient Encounter Medications as of 03/13/2022  Medication Sig   albuterol (VENTOLIN HFA) 108 (90 Base) MCG/ACT inhaler TAKE 2 PUFFS BY MOUTH EVERY 6 HOURS AS NEEDED FOR WHEEZE OR SHORTNESS OF BREATH   aspirin EC 81 MG tablet Take 81 mg by mouth daily. Swallow whole.   atorvastatin (LIPITOR) 40 MG tablet TAKE 1 TABLET BY MOUTH EVERY DAY   BD INSULIN SYRINGE U/F 31G X 5/16" 1 ML MISC USE UP TO 3 TIMES A DAY   dexlansoprazole (DEXILANT) 60 MG capsule TAKE 1 CAPSULE BY MOUTH EVERY DAY    fluticasone (FLONASE) 50 MCG/ACT nasal spray Place 1 spray into both nostrils daily.   furosemide (LASIX) 20 MG tablet TAKE 1 TABLET (20 MG TOTAL) BY MOUTH DAILY AS NEEDED FOR FLUID OR EDEMA.   gabapentin (NEURONTIN) 600 MG tablet TAKE 1 TABLET IN THE MORNING AND 2 TABLETS AT BEDTIME   glucose blood (ONETOUCH ULTRA) test strip USE 2 TIMES DAILY. AND LANCETS 2/DAY   ibuprofen (ADVIL) 200 MG tablet Take 200 mg by mouth daily.   insulin NPH-regular Human (HUMULIN 70/30) (70-30) 100 UNIT/ML injection INJECT 32 UNITS WITH BREAKFAST, AND 14 UNITS WITH SUPPER.   ketoconazole (NIZORAL) 2 % cream APPLY TWICE A DAY FOR 2 WEEKS AS NEEDED FOR RASH   olmesartan (BENICAR) 40 MG tablet TAKE 1 TABLET BY MOUTH EVERY DAY   ondansetron (ZOFRAN ODT) 4 MG disintegrating tablet Take 1 tablet (4 mg total) by mouth every 8 (eight) hours as needed for nausea or vomiting.   polyethylene glycol powder ([employer] PURELAX) 17 GM/SCOOP powder DISSOLVE 1 CAPFUL IN AT LEAST 8 OUNCES WATER/JUICE AND DRINK TWICE DAILY   sucralfate (CARAFATE) 1 GM/10ML suspension Take 10 mLs (1 g total) by mouth 4 (four) times daily.   TRULICITY 09.16MXI/5.0TUSOPN INJECT 0.75 MG SUBCUTANEOUSLY ONE TIME PER WEEK   valACYclovir (VALTREX) 500 MG tablet TAKE 1 TABLET BY MOUTH EVERY DAY   Vitamin D,  Ergocalciferol, (DRISDOL) 1.25 MG (50000 UNIT) CAPS capsule Take 1 capsule (50,000 Units total) by mouth every 7 (seven) days.   DULoxetine (CYMBALTA) 20 MG capsule TAKE 1 CAPSULE BY MOUTH EVERY DAY (Patient not taking: Reported on 03/13/2022)   tiZANidine (ZANAFLEX) 2 MG tablet Take 1 tablet (2 mg total) by mouth at bedtime. (Patient not taking: Reported on 03/13/2022)   No facility-administered encounter medications on file as of 03/13/2022.    Allergies (verified) Codeine, Gluten meal, Metronidazole, Nsaids, Soy allergy, and Wheat bran   History: Past Medical History:  Diagnosis Date   Anxiety    Arthritis    bilateral knees   Asthma    childhood    Autonomic neuropathy    diabetic   Cataract    Celiac disease    Constipation    Depression    Diabetes mellitus    type II, Hemoglobin A1C 9.9 10/05/2011   Diabetic neuropathy (HCC)    Diverticulosis    Fatty liver    Gastroparesis    GERD (gastroesophageal reflux disease)    Hyperlipidemia    Hypertension    IBS (irritable bowel syndrome)    Kidney stones    Personal history of colonic polyps 03/2010   hyperplastic   Shingles 2021   Past Surgical History:  Procedure Laterality Date   APPENDECTOMY     CATARACT EXTRACTION Right 04/29/2018   CATARACT EXTRACTION Left 03/2018   COLONOSCOPY     DILATATION & CURRETTAGE/HYSTEROSCOPY WITH RESECTOCOPE N/A 03/24/2013   Procedure: DILATATION & CURETTAGE, HYSTEROSCOPY WITH RESECTION;  Surgeon: Olga Millers, MD;  Location: [facility];  Service: Gynecology;  Laterality: N/A;   HYSTEROSCOPY WITH D & C N/A 11/08/2015   Procedure: DILATATION AND CURETTAGE /HYSTEROSCOPY;  Surgeon: Olga Millers, MD;  Location: [facility];  Service: Gynecology;  Laterality: N/A;   left breast cyst removal  2000   LEFT HEART CATH AND CORONARY ANGIOGRAPHY N/A 10/31/2018   Procedure: LEFT HEART CATH AND CORONARY ANGIOGRAPHY;  Surgeon: Jettie Booze, MD;  Location: [facility];  Service: Cardiovascular;  Laterality: N/A;   TUBAL LIGATION     Family History  Problem Relation Age of Onset   Hypertension Mother    Colon cancer Sister        dx in her early 25s   Diabetes Sister    Endometrial cancer Sister    Hypertension Brother    Other Father        2 collapsed lungs   COPD Father    Diabetes Father    Heart attack Father    Heart failure Father    High blood pressure Father    Colon polyps Neg Hx    Esophageal cancer Neg Hx    Stomach cancer Neg Hx    Social History   Socioeconomic History   Marital status: Married    Spouse name: Devar Yglesias   Number of children: 2   Years of education: Not on file   Highest education level: Not on  file  Occupational History   Occupation: Scientist, 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   Occupation: retired  Tobacco Use   Smoking status: Never   Smokeless tobacco: Never  Scientific laboratory technician Use: Never used  Substance and Sexual Activity   Alcohol use: No   Drug use: No   Sexual activity: Yes    Birth control/protection: Post-menopausal, Surgical  Other Topics Concern   Not  on file  Social History Narrative   Not on file   Social Determinants of Health   Financial Resource Strain: Low Risk  (03/13/2022)   Overall Financial Resource Strain (CARDIA)    Difficulty of Paying Living Expenses: Not hard at all  Food Insecurity: No Food Insecurity (03/13/2022)   Hunger Vital Sign    Worried About Running Out of Food in the Last Year: Never true    Ran Out of Food in the Last Year: Never true  Transportation Needs: No Transportation Needs (03/13/2022)   PRAPARE - Hydrologist (Medical): No    Lack of Transportation (Non-Medical): No  Physical Activity: Inactive (03/13/2022)   Exercise Vital Sign    Days of Exercise per Week: 0 days    Minutes of Exercise per Session: 0 min  Stress: Stress Concern Present (03/13/2022)   [place]    Feeling of Stress : To some extent  Social Connections: Moderately Integrated (03/13/2022)   Social Connection and Isolation Panel [NHANES]    Frequency of Communication with Friends and Family: More than three times a week    Frequency of Social Gatherings with Friends and Family: Once a week    Attends Religious Services: More than 4 times per year    Active Member of [employer] or Organizations: No    Attends Music therapist: Never    Marital Status: Married    Tobacco Counseling Counseling given: Not Answered   Clinical Intake:  Pre-visit preparation completed: Yes  Pain : No/denies pain     BMI -  recorded: 34.45 Nutritional Status: BMI > 30  Obese Nutritional Risks: None Diabetes: Yes CBG done?: No Did pt. bring in CBG monitor from home?: No  How often do you need to have someone help you when you read instructions, pamphlets, or other written materials from your doctor or pharmacy?: 1 - Never  Diabetic?Nutrition Risk Assessment:  Has the patient had any N/V/D within the last 2 months?   yes Does the patient have any non-healing wounds?  No  Has the patient had any unintentional weight loss or weight gain?  No   Diabetes:  Is the patient diabetic?  Yes  If diabetic, was a CBG obtained today?  No  Did the patient bring in their glucometer from home?  No  How often do you monitor your CBG's? Every other day.   Financial Strains and Diabetes Management:  Are you having any financial strains with the device, your supplies or your medication? No .  Does the patient want to be seen by Chronic Care Management for management of their diabetes?  No  Would the patient like to be referred to a Nutritionist or for Diabetic Management?  No   Diabetic Exams:  Diabetic Eye Exam: Completed 04/07/21 Diabetic Foot Exam: Completed 68/21/23   Interpreter Needed?: No  Information entered by :: Charlott Rakes, LPN   Activities of Daily Living    03/13/2022    2:16 PM  In your present state of health, do you have any difficulty performing the following activities:  Hearing? 0  Vision? 0  Difficulty concentrating or making decisions? 0  Walking or climbing stairs? 1  Comment related to knees  Dressing or bathing? 0  Doing errands, shopping? 0  Preparing Food and eating ? N  Using the Toilet? N  In the past six months, have you accidently leaked urine? N  Do you  have problems with loss of bowel control? N  Managing your Medications? N  Managing your Finances? N  Housekeeping or managing your Housekeeping? N    Patient Care Team: [name], MD as PCP - General (Family  Medicine) Jettie Booze, MD as PCP - Cardiology (Cardiology) Olga Millers, MD as Attending Physician (Obstetrics and Gynecology) Renato Shin, MD (Inactive) as Consulting Physician (Endocrinology) Pyrtle, Lajuan Lines, MD as Consulting Physician (Gastroenterology) Stephannie Li, [place] as Consulting Physician (Ophthalmology) Garrel Ridgel, DPM as Consulting Physician (Podiatry) Edythe Clarity, [facility] as Pharmacist (Pharmacist)  Indicate any recent Medical Services you may have received from other than [facility] providers in the past year (date may be approximate).     Assessment:   This is a routine wellness examination for Brunette.  Hearing/Vision screen Hearing Screening - Comments:: Pt denies ay hearing issue  Vision Screening - Comments:: Pt follows up with Dr Sharren Bridge for annul eye exams   Dietary issues and exercise activities discussed: Current Exercise Habits: The patient does not participate in regular exercise at present   Goals Addressed             This Visit's Progress    Patient Stated       Get back to exercise        Depression Screen    03/13/2022    2:12 PM 12/05/2021    1:38 PM 10/19/2021    1:43 PM 04/13/2021   11:42 AM 02/24/2021    2:33 PM 04/05/2020   10:09 AM 02/19/2020    2:35 PM  PHQ 2/9 Scores  PHQ - 2 Score 1 0 3 3 0 4 1  PHQ- 9 Score  0 9 10  8      Fall Risk    03/13/2022    2:16 PM 12/05/2021    1:38 PM 10/19/2021    1:38 PM 02/24/2021    2:37 PM 04/05/2020   10:09 AM  Fall Risk   Falls in the past year? 0 0 0 0 1  Number falls in past yr: 0 0 0 0 0  Injury with Fall? 0 0 0 0 0  Risk for fall due to : No Fall Risks;Impaired mobility No Fall Risks No Fall Risks Impaired vision   Follow up Falls prevention discussed Falls evaluation completed Falls evaluation completed Falls prevention discussed     FALL RISK PREVENTION PERTAINING TO THE HOME:  Any stairs in or around the home? Yes  If so, are there any without handrails? No  Home  free of loose throw rugs in walkways, pet beds, electrical cords, etc? Yes  Adequate lighting in your home to reduce risk of falls? Yes   ASSISTIVE DEVICES UTILIZED TO PREVENT FALLS:  Life alert? No  Use of a cane, walker or w/c? No  Grab bars in the bathroom? Yes  Shower chair or bench in shower? Yes  Elevated toilet seat or a handicapped toilet? Yes   TIMED UP AND GO:  Was the test performed? No .   Cognitive Function: declined see note below         03/13/2022    2:17 PM 02/24/2021    2:39 PM 02/19/2020    2:43 PM 01/16/2019    3:25 PM  6CIT Screen  What Year? 0 points 0 points 0 points 0 points  What month? 0 points 0 points 0 points 0 points  What time? 0 points 0 points  0 points  Count back from 20  0 points 0 points 0 points 0 points  Months in reverse 0 points 0 points 0 points 0 points  Repeat phrase 0 points 0 points 4 points 0 points  Total Score 0 points 0 points  0 points    Immunizations Immunization History  Administered Date(s) Administered   Fluad Quad(high Dose 65+) 01/16/2019, 01/20/2020   Influenza,inj,Quad PF,6+ Mos 02/14/2018   Influenza-Unspecified 12/30/2020   PFIZER Comirnaty(Gray Top)Covid-19 Tri-Sucrose Vaccine 09/02/2020   PFIZER(Purple Top)SARS-COV-2 Vaccination 07/03/2019, 08/06/2019   Pneumococcal Conjugate-13 11/03/2015   Pneumococcal Polysaccharide-23 08/17/2010, 02/14/2018   Td 12/31/2002   Tdap 10/25/2017   Zoster Recombinat (Shingrix) 11/05/2018, 01/28/2019    TDAP status: Up to date  Flu Vaccine status: Up to date  Pneumococcal vaccine status: Up to date  Covid-19 vaccine status: Completed vaccines  Qualifies for Shingles Vaccine? Yes   Zostavax completed Yes   Shingrix Completed?: Yes  Screening Tests Health Maintenance  Topic Date Due   COVID-19 Vaccine (4 - Pfizer risk series) 04/24/2022 (Originally 10/28/2020)   INFLUENZA VACCINE  07/30/2022 (Originally 11/29/2021)   OPHTHALMOLOGY EXAM  04/07/2022   Diabetic  kidney evaluation - GFR measurement  06/09/2022   MAMMOGRAM  07/23/2022   HEMOGLOBIN A1C  08/10/2022   Diabetic kidney evaluation - Urine ACR  10/20/2022   FOOT EXAM  10/20/2022   Medicare Annual Wellness (AWV)  03/14/2023   COLONOSCOPY (Pts 45-8yr Insurance coverage will need to be confirmed)  10/13/2024   DEXA SCAN  10/25/2025   TETANUS/TDAP  10/26/2027   Pneumonia Vaccine 71 Years old  Completed   Hepatitis C Screening  Completed   Zoster Vaccines- Shingrix  Completed   HPV VACCINES  Aged Out    Health Maintenance  There are no preventive care reminders to display for this patient.   Colorectal cancer screening: Type of screening: Colonoscopy. Completed 10/14/19. Repeat every 5 years  Mammogram status: Completed 07/22/21. Repeat every year  Bone Density status: Completed 10/25/20. Results reflect: Bone density results: NORMAL. Repeat every 5 years.   Additional Screening:  Hepatitis C Screening:  Completed 11/03/15  Vision Screening: Recommended annual ophthalmology exams for early detection of glaucoma and other disorders of the eye. Is the patient up to date with their annual eye exam?  Yes  Who is the provider or what is the name of the office in which the patient attends annual eye exams? Dr MSharren Bridge If pt is not established with a provider, would they like to be referred to a provider to establish care? No .   Dental Screening: Recommended annual dental exams for proper oral hygiene  Community Resource Referral / Chronic Care Management: CRR required this visit?  No   CCM required this visit?  No      Plan:     I have personally reviewed and noted the following in the patient's chart:   Medical and social history Use of alcohol, tobacco or illicit drugs  Current medications and supplements including opioid prescriptions. Patient is not currently taking opioid prescriptions. Functional ability and status Nutritional status Physical activity Advanced  directives List of other physicians Hospitalizations, surgeries, and ER visits in previous 12 months Vitals Screenings to include cognitive, depression, and falls Referrals and appointments  In addition, I have reviewed and discussed with patient certain preventive protocols, quality metrics, and best practice recommendations. A written personalized care plan for preventive services as well as general preventive health recommendations were provided to patient.     TWillette Brace LPN  03/13/2022   Nurse Notes: Pt declined cognition test at this time pt remained alert and knowledgeable to all questions asked

## 2022-03-13 NOTE — Patient Instructions (Signed)
[name] , Thank you for taking time to come for your Medicare Wellness Visit. I appreciate your ongoing commitment to your health goals. Please review the following plan we discussed and let me know if I can assist you in the future.   These are the goals we discussed:  Goals      Increase physical activity     Would like to increase activity to increase energy level      Patient Stated     Lose weight     Patient Stated     Get blood sugar under control      Set My Target A1C-Diabetes Type 2     Timeframe:  Long-Range Goal Priority:  High Start Date:  04/27/21                           Expected End Date: 10/26/21                      Follow Up Date 07/26/21    - set target A1C    Why is this important?   Your target A1C is decided together by you and your doctor.  It is based on several things like your age and other health issues.    Notes: A1c < 7        This is a list of the screening recommended for you and due dates:  Health Maintenance  Topic Date Due   Medicare Annual Wellness Visit  02/24/2022   COVID-19 Vaccine (4 - Pfizer risk series) 04/24/2022*   Flu Shot  07/30/2022*   Eye exam for diabetics  04/07/2022   Yearly kidney function blood test for diabetes  06/09/2022   Mammogram  07/23/2022   Hemoglobin A1C  08/10/2022   Yearly kidney health urinalysis for diabetes  10/20/2022   Complete foot exam   10/20/2022   Colon Cancer Screening  10/13/2024   DEXA scan (bone density measurement)  10/25/2025   Tetanus Vaccine  10/26/2027   Pneumonia Vaccine  Completed   Hepatitis C Screening: USPSTF Recommendation to screen - Ages 33-79 yo.  Completed   Zoster (Shingles) Vaccine  Completed   HPV Vaccine  Aged Out  *Topic was postponed. The date shown is not the original due date.    Advanced directives: Please bring a copy of your health care power of attorney and living will to the office at your convenience.  Conditions/risks identified: Get back to  exercise   Next appointment: Follow up in one year for your annual wellness visit    Preventive Care 65 Years and Older, Female Preventive care refers to lifestyle choices and visits with your health care provider that can promote health and wellness. What does preventive care include? A yearly physical exam. This is also called an annual well check. Dental exams once or twice a year. Routine eye exams. Ask your health care provider how often you should have your eyes checked. Personal lifestyle choices, including: Daily care of your teeth and gums. Regular physical activity. Eating a healthy diet. Avoiding tobacco and drug use. Limiting alcohol use. Practicing safe sex. Taking low-dose aspirin every day. Taking vitamin and mineral supplements as recommended by your health care provider. What happens during an annual well check? The services and screenings done by your health care provider during your annual well check will depend on your age, overall health, lifestyle risk factors, and family history of disease. Counseling  Your health care provider may ask you questions about your: Alcohol use. Tobacco use. Drug use. Emotional well-being. Home and relationship well-being. Sexual activity. Eating habits. History of falls. Memory and ability to understand (cognition). Work and work Statistician. Reproductive health. Screening  You may have the following tests or measurements: Height, weight, and BMI. Blood pressure. Lipid and cholesterol levels. These may be checked every 5 years, or more frequently if you are over 69 years old. Skin check. Lung cancer screening. You may have this screening every year starting at age 7 if you have a 30-pack-year history of smoking and currently smoke or have quit within the past 15 years. Fecal occult blood test (FOBT) of the stool. You may have this test every year starting at age 26. Flexible sigmoidoscopy or colonoscopy. You may have a  sigmoidoscopy every 5 years or a colonoscopy every 10 years starting at age 88. Hepatitis C blood test. Hepatitis B blood test. Sexually transmitted disease (STD) testing. Diabetes screening. This is done by checking your blood sugar (glucose) after you have not eaten for a while (fasting). You may have this done every 1-3 years. Bone density scan. This is done to screen for osteoporosis. You may have this done starting at age 37. Mammogram. This may be done every 1-2 years. Talk to your health care provider about how often you should have regular mammograms. Talk with your health care provider about your test results, treatment options, and if necessary, the need for more tests. Vaccines  Your health care provider may recommend certain vaccines, such as: Influenza vaccine. This is recommended every year. Tetanus, diphtheria, and acellular pertussis (Tdap, Td) vaccine. You may need a Td booster every 10 years. Zoster vaccine. You may need this after age 10. Pneumococcal 13-valent conjugate (PCV13) vaccine. One dose is recommended after age 25. Pneumococcal polysaccharide (PPSV23) vaccine. One dose is recommended after age 71. Talk to your health care provider about which screenings and vaccines you need and how often you need them. This information is not intended to replace advice given to you by your health care provider. Make sure you discuss any questions you have with your health care provider. Document Released: 05/14/2015 Document Revised: 01/05/2016 Document Reviewed: 02/16/2015 Elsevier Interactive Patient Education  2017 [employer] Prevention in the Home Falls can cause injuries. They can happen to people of all ages. There are many things you can do to make your home safe and to help prevent falls. What can I do on the outside of my home? Regularly fix the edges of walkways and driveways and fix any cracks. Remove anything that might make you trip as you walk through a  door, such as a raised step or threshold. Trim any bushes or trees on the path to your home. Use bright outdoor lighting. Clear any walking paths of anything that might make someone trip, such as rocks or tools. Regularly check to see if handrails are loose or broken. Make sure that both sides of any steps have handrails. Any raised decks and porches should have guardrails on the edges. Have any leaves, snow, or ice cleared regularly. Use sand or salt on walking paths during winter. Clean up any spills in your garage right away. This includes oil or grease spills. What can I do in the bathroom? Use night lights. Install grab bars by the toilet and in the tub and shower. Do not use towel bars as grab bars. Use non-skid mats or decals in the tub  or shower. If you need to sit down in the shower, use a plastic, non-slip stool. Keep the floor dry. Clean up any water that spills on the floor as soon as it happens. Remove soap buildup in the tub or shower regularly. Attach bath mats securely with double-sided non-slip rug tape. Do not have throw rugs and other things on the floor that can make you trip. What can I do in the bedroom? Use night lights. Make sure that you have a light by your bed that is easy to reach. Do not use any sheets or blankets that are too big for your bed. They should not hang down onto the floor. Have a firm chair that has side arms. You can use this for support while you get dressed. Do not have throw rugs and other things on the floor that can make you trip. What can I do in the kitchen? Clean up any spills right away. Avoid walking on wet floors. Keep items that you use a lot in easy-to-reach places. If you need to reach something above you, use a strong step stool that has a grab bar. Keep electrical cords out of the way. Do not use floor polish or wax that makes floors slippery. If you must use wax, use non-skid floor wax. Do not have throw rugs and other things  on the floor that can make you trip. What can I do with my stairs? Do not leave any items on the stairs. Make sure that there are handrails on both sides of the stairs and use them. Fix handrails that are broken or loose. Make sure that handrails are as long as the stairways. Check any carpeting to make sure that it is firmly attached to the stairs. Fix any carpet that is loose or worn. Avoid having throw rugs at the top or bottom of the stairs. If you do have throw rugs, attach them to the floor with carpet tape. Make sure that you have a light switch at the top of the stairs and the bottom of the stairs. If you do not have them, ask someone to add them for you. What else can I do to help prevent falls? Wear shoes that: Do not have high heels. Have rubber bottoms. Are comfortable and fit you well. Are closed at the toe. Do not wear sandals. If you use a stepladder: Make sure that it is fully opened. Do not climb a closed stepladder. Make sure that both sides of the stepladder are locked into place. Ask someone to hold it for you, if possible. Clearly mark and make sure that you can see: Any grab bars or handrails. First and last steps. Where the edge of each step is. Use tools that help you move around (mobility aids) if they are needed. These include: Canes. Walkers. Scooters. Crutches. Turn on the lights when you go into a dark area. Replace any light bulbs as soon as they burn out. Set up your furniture so you have a clear path. Avoid moving your furniture around. If any of your floors are uneven, fix them. If there are any pets around you, be aware of where they are. Review your medicines with your doctor. Some medicines can make you feel dizzy. This can increase your chance of falling. Ask your doctor what other things that you can do to help prevent falls. This information is not intended to replace advice given to you by your health care provider. Make sure you discuss any  questions  you have with your health care provider. Document Released: 02/11/2009 Document Revised: 09/23/2015 Document Reviewed: 05/22/2014 Elsevier Interactive Patient Education  2017 [employer].

## 2022-03-14 ENCOUNTER — Telehealth: Payer: Medicare Other

## 2022-03-15 ENCOUNTER — Ambulatory Visit (INDEPENDENT_AMBULATORY_CARE_PROVIDER_SITE_OTHER): Payer: Medicare Other | Admitting: Bariatrics

## 2022-03-15 ENCOUNTER — Encounter: Payer: Self-pay | Admitting: Bariatrics

## 2022-03-15 VITALS — BP 150/76 | HR 65 | Temp 97.9°F | Ht 65.0 in | Wt 209.0 lb

## 2022-03-15 DIAGNOSIS — I10 Essential (primary) hypertension: Secondary | ICD-10-CM

## 2022-03-15 DIAGNOSIS — Z7985 Long-term (current) use of injectable non-insulin antidiabetic drugs: Secondary | ICD-10-CM

## 2022-03-15 DIAGNOSIS — Z6834 Body mass index (BMI) 34.0-34.9, adult: Secondary | ICD-10-CM | POA: Diagnosis not present

## 2022-03-15 DIAGNOSIS — E1169 Type 2 diabetes mellitus with other specified complication: Secondary | ICD-10-CM | POA: Diagnosis not present

## 2022-03-15 DIAGNOSIS — E669 Obesity, unspecified: Secondary | ICD-10-CM

## 2022-03-15 DIAGNOSIS — Z6841 Body Mass Index (BMI) 40.0 and over, adult: Secondary | ICD-10-CM | POA: Insufficient documentation

## 2022-03-15 MED ORDER — ONDANSETRON 4 MG PO TBDP: 4.0000 mg | ORAL_TABLET | Freq: Three times a day (TID) | ORAL | 0 refills | Status: DC | PRN

## 2022-03-15 MED ORDER — TRULICITY 0.75 MG/0.5ML ~~LOC~~ SOAJ: SUBCUTANEOUS | 0 refills | Status: DC

## 2022-03-17 ENCOUNTER — Ambulatory Visit: Payer: Medicare Other | Admitting: Pharmacist

## 2022-03-17 NOTE — Progress Notes (Signed)
Chronic Care Management Pharmacy Note  03/17/2022 Name:  Jessica [name] MRN:  [id number] DOB:  01/01/1951  Summary: Patient doing well with glucose.  Having some nausea with Trulicity, back down to 0.44m weekly.  She is also still having the sinus concerns.  Using Flonase but does not just want to have to use Claritin all the time, requests a referral to an allergy specialist.  Did not take the Cymbalta.  Recommendations: Consider allergy referral for sinus, use Claritin for symptoms relief  FU 6 months    Subjective: Jessica [name] is an 71y.o. year old female who is a primary patient of ALeamon Arnt MD.  The CCM team was consulted for assistance with disease management and care coordination needs.    Engaged with patient by telephone for follow up visit in response to provider referral for pharmacy case management and/or care coordination services.   Consent to Services:  The patient was given the following information about Chronic Care Management services today, agreed to services, and gave verbal consent: 1. CCM service includes personalized support from designated clinical staff supervised by the primary care provider, including individualized plan of care and coordination with other care providers 2. 24/7 contact phone numbers for assistance for urgent and routine care needs. 3. Service will only be billed when office clinical staff spend 20 minutes or more in a month to coordinate care. 4. Only one practitioner may furnish and bill the service in a calendar month. 5.The patient may stop CCM services at any time (effective at the end of the month) by phone call to the office staff. 6. The patient will be responsible for cost sharing (co-pay) of up to 20% of the service fee (after annual deductible is met). Patient agreed to services and consent obtained.  Patient Care Team: ALeamon Arnt MD as PCP - General (Family Medicine) VJettie Booze MD as PCP - Cardiology  (Cardiology) AOlga Millers MD as Attending Physician (Obstetrics and Gynecology) ERenato Shin MD (Inactive) as Consulting Physician (Endocrinology) Pyrtle, JLajuan Lines MD as Consulting Physician (Gastroenterology) MStephannie Li OOnekamaas Consulting Physician (Ophthalmology) HGarrel Ridgel DPM as Consulting Physician (Podiatry) DEdythe Clarity [facility] Pharmacist (Pharmacist)  Recent office visits:  04/13/2021 OV (PCP) ALeamon Arnt MD;  recommend starting Lexapro daily   Recent consult visits:  03/10/2021 OV (MWM) BJearld LeschA, DO; no medication changes indicated.   02/09/2021 OV (MWM) BJearld LeschA, DO; no medication changes indicated.   02/09/2021 (Patient Message with Endocrinology) ERenato Shin MD; Increase Ozempic to 0.5 mg   01/12/2021 OV (MWM) BJearld LeschA, DO; no medication changes indicated.   12/28/2020 VV (Endocrinology) ERenato Shin MD; Please change the insulin to 40 units with breakfast, and 8 units with supper.   check your blood sugar twice a day.  vary the time of day when you check, between before the 3 meals, and at bedtime.  also check if you have symptoms of your blood sugar being too high or too low.  please keep a record of the readings and bring it to your next appointment here (or you can bring the meter itself).     12/01/2020 OV (MWM) BJearld LeschA, DO; no medication changes indicated.   11/18/2020 OV (Podiatry) HSeymour Max T, DPM; no medication changes indicated.   11/03/2020 OV (MWM) BJearld LeschA, DO; no medication changes indicated.   Hospital visits:  None in previous 6 months   Objective:  Lab  Results  Component Value Date   CREATININE 0.95 06/09/2021   BUN 23 06/09/2021   GFR 60.62 06/09/2021   GFRNONAA 61 03/11/2020   GFRAA 70 03/11/2020   NA 138 06/09/2021   K 4.6 06/09/2021   CALCIUM 9.7 06/09/2021   CO2 33 (H) 06/09/2021   GLUCOSE 154 (H) 06/09/2021    Lab Results  Component Value Date/Time   HGBA1C 6.6 (A)  02/08/2022 11:43 AM   HGBA1C 6.3 (A) 10/19/2021 01:45 PM   HGBA1C 6.7 (H) 07/24/2019 04:30 PM   HGBA1C 7.1 (H) 07/17/2017 04:13 PM   GFR 60.62 06/09/2021 10:10 AM   GFR 58.47 (L) 04/13/2021 11:49 AM   MICROALBUR <0.7 10/19/2021 02:04 PM   MICROALBUR 0.7 10/25/2017 02:14 PM    Last diabetic Eye exam:  Lab Results  Component Value Date/Time   HMDIABEYEEXA Retinopathy (A) 04/05/2020 12:00 AM    Last diabetic Foot exam: No results found for: "HMDIABFOOTEX"   Lab Results  Component Value Date   CHOL 135 04/13/2021   HDL 59.70 04/13/2021   LDLCALC 65 04/13/2021   LDLDIRECT 143.2 11/23/2006   TRIG 50.0 04/13/2021   CHOLHDL 2 04/13/2021       Latest Ref Rng & Units 06/09/2021   10:10 AM 04/13/2021   11:49 AM 10/07/2020   11:35 AM  Hepatic Function  Total Protein 6.0 - 8.3 g/dL 6.9  6.4  6.9   Albumin 3.5 - 5.2 g/dL 3.9  3.8  4.0   AST 0 - 37 U/L _0 ALT 0 - 35 U/L _1 Alk Phosphatase 39 - 117 U/L 69  71  72   Total Bilirubin 0.2 - 1.2 mg/dL 0.7  0.5  0.5     Lab Results  Component Value Date/Time   TSH 0.91 04/13/2021 11:49 AM   TSH 1.320 09/07/2020 12:31 PM   FREET4 1.18 09/07/2020 12:31 PM       Latest Ref Rng & Units 06/09/2021   10:10 AM 04/13/2021   11:49 AM 09/07/2020   12:31 PM  CBC  WBC 4.0 - 10.5 K/uL 3.4  3.3    Hemoglobin 12.0 - 15.0 g/dL 14.7  13.4    Hematocrit 36.0 - 46.0 % 43.7  40.2  42.6   Platelets 150.0 - 400.0 K/uL 214.0  193.0      Lab Results  Component Value Date/Time   VD25OH 44.70 04/13/2021 11:49 AM   VD25OH 85.0 09/07/2020 12:31 PM   VD25OH 61.6 03/11/2020 10:38 AM   VD25OH 29.57 (L) 11/03/2015 12:10 PM    Clinical ASCVD: No  The 10-year ASCVD risk score (Arnett DK, et al., 2019) is: 26.2%   Values used to calculate the score:     Age: 71 years     Sex: Female     Is Non-Hispanic African American: Yes     Diabetic: Yes     Tobacco smoker: No     Systolic Blood Pressure: 836 mmHg     Is BP treated: Yes     HDL  Cholesterol: 59.7 mg/dL     Total Cholesterol: 135 mg/dL       03/13/2022    2:12 PM 12/05/2021    1:38 PM 10/19/2021    1:43 PM  Depression screen PHQ 2/9  Decreased Interest 0 0 2  Down, Depressed, Hopeless 1 0 1  PHQ - 2 Score 1 0 3  Altered sleeping  0 2  Tired, decreased  energy  0 3  Change in appetite  0 1  Feeling bad or failure about yourself   0 0  Trouble concentrating  0 0  Moving slowly or fidgety/restless  0 0  Suicidal thoughts  0 0  PHQ-9 Score  0 9  Difficult doing work/chores  Not difficult at all Somewhat difficult     Social History   Tobacco Use  Smoking Status Never  Smokeless Tobacco Never   BP Readings from Last 3 Encounters:  03/15/22 (!) 150/76  02/21/22 132/68  02/15/22 (!) 151/69   Pulse Readings from Last 3 Encounters:  03/15/22 65  02/21/22 61  02/15/22 60   Wt Readings from Last 3 Encounters:  03/15/22 209 lb (94.8 kg)  03/13/22 207 lb (93.9 kg)  02/21/22 215 lb (97.5 kg)   BMI Readings from Last 3 Encounters:  03/15/22 34.78 kg/m  03/13/22 34.45 kg/m  02/21/22 35.78 kg/m    Assessment/Interventions: Review of patient past medical history, allergies, medications, health status, including review of consultants reports, laboratory and other test data, was performed as part of comprehensive evaluation and provision of chronic care management services.   SDOH:  (Social Determinants of Health) assessments and interventions performed: No, done within the year Financial Resource Strain: Low Risk  (03/13/2022)   Overall Financial Resource Strain (CARDIA)    Difficulty of Paying Living Expenses: Not hard at all   Food Insecurity: No Food Insecurity (03/13/2022)   Hunger Vital Sign    Worried About Running Out of Food in the Last Year: Never true    Ran Out of Food in the Last Year: Never true    SDOH Interventions    Flowsheet Row Clinical Support from 03/13/2022 in [place] Visit from 10/19/2021 in  [place] Interventions Intervention Not Indicated --  Housing Interventions Intervention Not Indicated --  Transportation Interventions Intervention Not Indicated --  Depression Interventions/Treatment  -- Currently on Treatment  Financial Strain Interventions Intervention Not Indicated --  Physical Activity Interventions Intervention Not Indicated --  Stress Interventions Intervention Not Indicated --  Social Connections Interventions Intervention Not Indicated --      Financial Resource Strain: Low Risk  (03/13/2022)   Overall Financial Resource Strain (CARDIA)    Difficulty of Paying Living Expenses: Not hard at all    [place]: No Food Insecurity (03/13/2022)  Housing: Low Risk  (03/13/2022)  Transportation Needs: No Transportation Needs (03/13/2022)  Depression (PHQ2-9): Low Risk  (03/13/2022)  Financial Resource Strain: Low Risk  (03/13/2022)  Physical Activity: Inactive (03/13/2022)  Social Connections: Moderately Integrated (03/13/2022)  Stress: Stress Concern Present (03/13/2022)  Tobacco Use: Low Risk  (03/15/2022)    CCM Care Plan  Allergies  Allergen Reactions   Codeine     Other reaction(s): vomiting   Gluten Meal Other (See Comments)    No wheat or soy   Metronidazole     Other reaction(s): vomiting   Nsaids Nausea And Vomiting and Other (See Comments)    Cannot tolerate large doses  Other reaction(s): vomiting Other reaction(s): vomiting   Soy Allergy    Wheat Bran     Medications Reviewed Today     Reviewed by Edythe Clarity, [facility] (Pharmacist) on 03/17/22 at 1046  Med List Status: <None>   Medication Order Taking? Sig Documenting Provider Last Dose Status Informant  albuterol (VENTOLIN HFA) 108 (90 Base) MCG/ACT inhaler [id number] Yes  TAKE 2 PUFFS BY MOUTH EVERY 6 HOURS AS NEEDED FOR WHEEZE OR SHORTNESS OF BREATH [name], MD Taking Active    aspirin EC 81 MG tablet [id number] Yes Take 81 mg by mouth daily. Swallow whole. [provider] Taking Active   atorvastatin (LIPITOR) 40 MG tablet [id number] Yes TAKE 1 TABLET BY MOUTH EVERY DAY [name], MD Taking Active   BD INSULIN SYRINGE U/F 31G X 5/16" 1 ML MISC [id number] Yes USE UP TO 3 TIMES A Marella Chimes, MD Taking Active   dexlansoprazole (DEXILANT) 60 MG capsule [id number] Yes TAKE 1 CAPSULE BY MOUTH EVERY DAY Pyrtle, Lajuan Lines, MD Taking Active   Dulaglutide (TRULICITY) 6.22 WL/7.9GX SOPN [id number] Yes INJECT 0.75 MG SUBCUTANEOUSLY ONE TIME PER WEEK [name] A, DO Taking Active   DULoxetine (CYMBALTA) 20 MG capsule [id number] Yes TAKE 1 CAPSULE BY MOUTH EVERY DAY Hulan Saas M, DO Taking Active   fluticasone (FLONASE) 50 MCG/ACT nasal spray [id number] Yes Place 1 spray into both nostrils daily. [name], MD Taking Active   furosemide (LASIX) 20 MG tablet [id number] Yes TAKE 1 TABLET (20 MG TOTAL) BY MOUTH DAILY AS NEEDED FOR FLUID OR EDEMA. [name], MD Taking Active   gabapentin (NEURONTIN) 600 MG tablet [id number] Yes TAKE 1 TABLET IN THE MORNING AND 2 TABLETS AT BEDTIME Hyatt, Max T, DPM Taking Active   glucose blood (ONETOUCH ULTRA) test strip [id number] Yes USE 2 TIMES DAILY. AND LANCETS 2/DAY Philemon Kingdom, MD Taking Active   ibuprofen (ADVIL) 200 MG tablet [id number] Yes Take 200 mg by mouth daily. [provider] Taking Active Self  insulin NPH-regular Human (HUMULIN 70/30) (70-30) 100 UNIT/ML injection [id number] Yes INJECT 32 UNITS WITH BREAKFAST, AND 14 UNITS WITH SUPPER. Philemon Kingdom, MD Taking Active   ketoconazole (NIZORAL) 2 % cream [id number] Yes APPLY TWICE A DAY FOR 2 WEEKS AS NEEDED FOR Arlee Muslim, MD Taking Active   olmesartan (BENICAR) 40 MG tablet [id number] Yes TAKE 1 TABLET BY MOUTH EVERY DAY [name], MD Taking Active   ondansetron (ZOFRAN ODT) 4 MG disintegrating tablet [id number] Yes Take 1 tablet (4  mg total) by mouth every 8 (eight) hours as needed for nausea or vomiting. [name] A, DO Taking Active   polyethylene glycol powder ([facility] PURELAX) 17 GM/SCOOP powder [id number] Yes DISSOLVE 1 CAPFUL IN AT LEAST 8 OUNCES WATER/JUICE AND DRINK TWICE DAILY Pyrtle, Lajuan Lines, MD Taking Active   sucralfate (CARAFATE) 1 GM/10ML suspension [id number] Yes Take 10 mLs (1 g total) by mouth 4 (four) times daily. Jerene Bears, MD Taking Active   tiZANidine (ZANAFLEX) 2 MG tablet [id number] Yes Take 1 tablet (2 mg total) by mouth at bedtime. Lyndal Pulley, DO Taking Active   valACYclovir (VALTREX) 500 MG tablet [id number] Yes TAKE 1 TABLET BY MOUTH EVERY DAY [name], MD Taking Active   Vitamin D, Ergocalciferol, (DRISDOL) 1.25 MG (50000 UNIT) CAPS capsule [id number] Yes Take 1 capsule (50,000 Units total) by mouth every 7 (seven) days. Georgia Lopes, DO Taking Active             Patient Active Problem List   Diagnosis Date Noted   Class 3 severe obesity with serious comorbidity and body mass index (BMI) of 40.0 to 44.9 in adult [facility]) 03/15/2022   Type 2 diabetes mellitus with obesity ([place]) 01/16/2022   Lumbar radiculopathy 08/25/2021   Problem related to unspecified psychosocial circumstances 07/05/2021   Localized  swelling of right lower extremity 06/07/2021   Increased body mass index 06/01/2021   Peripheral sensory neuropathy due to type 2 diabetes mellitus ([place]) 04/05/2020   Bilateral lower extremity edema 01/20/2020   Thyroid nodule 10/28/2019   Abnormal stress test    Family history of malignant neoplasm of endometrium 10/08/2018   Celiac disease 02/14/2018   Osteopenia 02/14/2018   Mild intermittent asthma 02/14/2018   Diabetic neuropathy associated with type 2 diabetes mellitus ([place]) 02/14/2018   Class 2 obesity due to excess calories with body mass index (BMI) of 37.0 to 37.9 in adult 02/14/2018   Fibroma of tongue 06/06/2016   Renal calculi 01/13/2016   Degenerative arthritis of  knee, bilateral 10/29/2015   Trigger point of right shoulder region 08/02/2015   Degenerative cervical disc 07/27/2015   OSA (obstructive sleep apnea) 02/28/2011   Constipation, slow transit 09/23/2010   Type 2 diabetes mellitus with complication, with long-term current use of insulin ([place]) 09/23/2010   FH: colon cancer 09/23/2010   Personal history of colonic polyps 09/23/2010   Combined hyperlipidemia associated with type 2 diabetes mellitus ([place]) 01/29/2009   Diabetic gastroparesis ([place]) 02/27/2007   Diabetic autonomic neuropathy associated with type 2 diabetes mellitus ([place]) 10/22/2006   Essential hypertension 10/22/2006    Immunization History  Administered Date(s) Administered   Fluad Quad(high Dose 65+) 01/16/2019, 01/20/2020   Influenza,inj,Quad PF,6+ Mos 02/14/2018   Influenza-Unspecified 12/30/2020   PFIZER Comirnaty(Gray Top)Covid-19 Tri-Sucrose Vaccine 09/02/2020   PFIZER(Purple Top)SARS-COV-2 Vaccination 07/03/2019, 08/06/2019   Pneumococcal Conjugate-13 11/03/2015   Pneumococcal Polysaccharide-23 08/17/2010, 02/14/2018   Td 12/31/2002   Tdap 10/25/2017   Zoster Recombinat (Shingrix) 11/05/2018, 01/28/2019    Conditions to be addressed/monitored:  HTN, OSA, Asthma, Type II DM w/ neuropathy, HLD, Osteopenia, Obesity  Care Plan : General Pharmacy (Adult)  Updates made by Edythe Clarity, RPH since 03/17/2022 12:00 AM     Problem: HTN, OSA, Asthma, Type II DM w/ neuropathy, HLD, Osteopenia, Obesity   Priority: High  Onset Date: 04/27/2021     Goal: Patient-Specific Goal   Note:   Current Barriers:  Sinus issues  Pharmacist Clinical Goal(s):  Patient will achieve improvement in A1c as evidenced by labs through collaboration with PharmD and provider.   Interventions: 1:1 collaboration with [name], MD regarding development and update of comprehensive plan of care as evidenced by provider attestation and co-signature Inter-disciplinary care team  collaboration (see longitudinal plan of care) Comprehensive medication review performed; medication list updated in electronic medical record  Hypertension (BP goal <130/80) 03/18/22 Uncontrolled, elevated 2/3 last time in office visits -Current treatment: Olmesartan 23m Appropriate, Query effective, -Medications previously tried: valsartan, HCTZ  -Current home readings: not checking regularly -Current dietary habits: see DM -Current exercise habits: same see previous -Denies hypotensive/hypertensive symptoms -Educated on BP goals and benefits of medications for prevention of heart attack, stroke and kidney damage; Exercise goal of 150 minutes per week; Importance of home blood pressure monitoring; Have asked her to monitor at least once or twice per week at home.  She does have occasional headaches but attributes this to sinus.  Monitor and record, will have CMA check on BP next month so we can determine if meds need to be adjusted. Continue current meds at this time, adherent to current regimen.  Hyperlipidemia: (LDL goal < 70) -Controlled -Current treatment: Atorvastatin 486mdaily -Medications previously tried: none noted  -Current dietary patterns: see DM -Current exercise habits: see HTN -Educated on Cholesterol goals;  Benefits of  statin for ASCVD risk reduction; Importance of limiting foods high in cholesterol; -Recommended to continue current medication Most recent LDL is controled, no concerns with medication at this time  Diabetes w/ neuropathy(A1c goal <7%) 03/17/22 -Controlled based on last A1c -Current medications: Trulicity 5.63JS into the skin Appropriate, Effective, Safe, Accessible Humulin 70/30 40 units with breakfast and 8 units with supper Appropriate, Effective, Safe, Accessible Gabapentin 612m 1 qam and 2hs -Medications previously tried: none noted  -Current home glucose readings fasting glucose: "normal" post prandial glucose: not checking  regularly -Denies hypoglycemic/hyperglycemic symptoms -Current meal patterns:  breakfast: cereal, rice chex, eggs/turkey sausage  lunch: deli meat, vegetables, pickled beets, skinny pop, 90 calorie rice krispie treat  dinner: vegetables, meat, salmon/chicken, some red meat snacks: yogurt bar, apples drinks: occasional soda, water -Current exercise: 6500 steps per day -Educated on A1c and blood sugar goals; Complications of diabetes including kidney damage, retinal damage, and cardiovascular disease; Prevention and management of hypoglycemic episodes; Benefits of routine self-monitoring of blood sugar; -Counseled to check feet daily and get yearly eye exams -Having some nausea with Trulicity, but has not affected her adherence to medication at this time.  Counseled on low fat smaller meals to combat nausea, ice cold water. Encouraged her to continue positive lifestyle changes - no need to change meds at this time continue to follow to ensure adherent.  Update 097/02/63Trulicity just increased to 1.534mweekly.  She has not had a chance to start this dose yet.  Dealing with the pain she has not really been checking her glucose at home.  She reports she has not missed any doses of medication she just has no been keeping up with her monitoring.  Discussed implementing some exercise maybe in the water which could possible help with her pain. Reminded patient she is due for A1c recheck.  Work to control pain then start to focus back on controlling glucose. No further changes at this time.  Celiac (Goal: Reduce symptoms) -Controlled, not assessed -Current treatment  Dexilant 6039maily Caragate 1gm/20m35mn -Medications previously tried: omeprazole  -Patient on very strict diet due to this - however diet as a whole seems pretty good. -Recommended to continue current medication Assessed patient finances. Dexilant copay high - will have patient assistance started for [employer].    Asthma (Goal:  Prevent exacerbation) -Controlled -Current treatment  Albuterol HFA 90mc40mn -Medications previously tried: none noted -rarely has to use  -Recommended to continue current medication Denies any recent episodes of SOB.  Depression?? (Goal: Reduce symptoms) -Not ideally controlled, not assessed -Current treatment  None noted -Medications previously tried: Wellbutrin , Lexapro (nausea) PHQ9 SCORE ONLY 04/13/2021 02/24/2021 04/05/2020  PHQ-9 Total Score 10 0 8  -Patient had never gotten prescription filled, she still has it at home.  Believes she is still having some symptoms of depression. We discussed and patient was willing to try medication.  Discussed that it would take about 2 weeks for full effect to take place.  Patient asked for a three week check in to determine efficacy of medication. Will have CMA call in 3 weeks to assess  Update 08/30/21 She was not able to tolerate Lexapro.  It made her nauseous.  She reports she is in some pain in her back that is causing her a lot of stress currently.  She is also still taking care of her husband and he is "not getting any better."       04/13/2021   11:42 AM 02/24/2021  2:33 PM 04/05/2020   10:09 AM  PHQ9 SCORE ONLY  PHQ-9 Total Score 10 0 8   PHQ-9 indicated moderate depression.  I do feel like if she controlled pain it would improve.  She is very sensitive to a lot of medications. Work to control pain, she is expecting call about her MRI soon.  If depression still exists can consider other medication alternatives. No changes a this time.  Patient Goals/Self-Care Activities Patient will:  - take medications as prescribed as evidenced by patient report and record review check glucose daily, document, and provide at future appointments check blood pressure a few times per week, document, and provide at future appointments target a minimum of 150 minutes of moderate intensity exercise weekly  Follow Up Plan: The care management  team will reach out to the patient again over the next 120 days.               Medication Assistance: Application for Dexilant  medication assistance program. in process.  Anticipated assistance start date unknown.  See plan of care for additional detail.  Compliance/Adherence/Medication fill history: Care Gaps: N/A  Star-Rating Drugs: Atorvastatin 40 mg last filled 12/22/21 90 DS Olmesartan 40 mg last filled 12/26/21 30 DS  Patient's preferred pharmacy is:  [id number] [place], [place] - [address] [place] [id number]: [phone]: 3[place]Phone: [phone]: [phone]  We discussed: Benefits of medication synchronization, packaging and delivery as well as enhanced pharmacist oversight with [employer]. Patient decided to: Continue current medication management strategy  Care Plan and Follow Up Patient Decision:  Patient agrees to Care Plan and Follow-up.  Plan: The care management team will reach out to the patient again over the next 120 days. CBeverly Milch PharmD Clinical Pharmacist  [facility]([phone]

## 2022-03-17 NOTE — Patient Instructions (Addendum)
Visit Information   Goals Addressed             This Visit's Progress    Set My Target A1C-Diabetes Type 2   On track    Timeframe:  Long-Range Goal Priority:  High Start Date:  04/27/21                           Expected End Date: 10/26/21                      Follow Up Date 07/26/21    - set target A1C    Why is this important?   Your target A1C is decided together by you and your doctor.  It is based on several things like your age and other health issues.    Notes: A1c < 7       Patient Care Plan: General Pharmacy (Adult)     Problem Identified: HTN, OSA, Asthma, Type II DM w/ neuropathy, HLD, Osteopenia, Obesity   Priority: High  Onset Date: 04/27/2021     Goal: Patient-Specific Goal   Note:   Current Barriers:  Sinus issues  Pharmacist Clinical Goal(s):  Patient will achieve improvement in A1c as evidenced by labs through collaboration with PharmD and provider.   Interventions: 1:1 collaboration with [name], MD regarding development and update of comprehensive plan of care as evidenced by provider attestation and co-signature Inter-disciplinary care team collaboration (see longitudinal plan of care) Comprehensive medication review performed; medication list updated in electronic medical record  Hypertension (BP goal <130/80) 03/18/22 Uncontrolled, elevated 2/3 last time in office visits -Current treatment: Olmesartan 54m Appropriate, Query effective, -Medications previously tried: valsartan, HCTZ  -Current home readings: not checking regularly -Current dietary habits: see DM -Current exercise habits: same see previous -Denies hypotensive/hypertensive symptoms -Educated on BP goals and benefits of medications for prevention of heart attack, stroke and kidney damage; Exercise goal of 150 minutes per week; Importance of home blood pressure monitoring; Have asked her to monitor at least once or twice per week at home.  She does have occasional  headaches but attributes this to sinus.  Monitor and record, will have CMA check on BP next month so we can determine if meds need to be adjusted. Continue current meds at this time, adherent to current regimen.  Hyperlipidemia: (LDL goal < 70) -Controlled -Current treatment: Atorvastatin 42mdaily -Medications previously tried: none noted  -Current dietary patterns: see DM -Current exercise habits: see HTN -Educated on Cholesterol goals;  Benefits of statin for ASCVD risk reduction; Importance of limiting foods high in cholesterol; -Recommended to continue current medication Most recent LDL is controled, no concerns with medication at this time  Diabetes w/ neuropathy(A1c goal <7%) 03/17/22 -Controlled based on last A1c -Current medications: Trulicity 0.2.20URnto the skin Appropriate, Effective, Safe, Accessible Humulin 70/30 40 units with breakfast and 8 units with supper Appropriate, Effective, Safe, Accessible Gabapentin 60058m qam and 2hs -Medications previously tried: none noted  -Current home glucose readings fasting glucose: "normal" post prandial glucose: not checking regularly -Denies hypoglycemic/hyperglycemic symptoms -Current meal patterns:  breakfast: cereal, rice chex, eggs/turkey sausage  lunch: deli meat, vegetables, pickled beets, skinny pop, 90 calorie rice krispie treat  dinner: vegetables, meat, salmon/chicken, some red meat snacks: yogurt bar, apples drinks: occasional soda, water -Current exercise: 6500 steps per day -Educated on A1c and blood sugar goals; Complications of diabetes including kidney damage, retinal damage, and cardiovascular  disease; Prevention and management of hypoglycemic episodes; Benefits of routine self-monitoring of blood sugar; -Counseled to check feet daily and get yearly eye exams -Having some nausea with Trulicity, but has not affected her adherence to medication at this time.  Counseled on low fat smaller meals to combat  nausea, ice cold water. Encouraged her to continue positive lifestyle changes - no need to change meds at this time continue to follow to ensure adherent.  Update 95/09/32 Trulicity just increased to 1.88m weekly.  She has not had a chance to start this dose yet.  Dealing with the pain she has not really been checking her glucose at home.  She reports she has not missed any doses of medication she just has no been keeping up with her monitoring.  Discussed implementing some exercise maybe in the water which could possible help with her pain. Reminded patient she is due for A1c recheck.  Work to control pain then start to focus back on controlling glucose. No further changes at this time.  Celiac (Goal: Reduce symptoms) -Controlled, not assessed -Current treatment  Dexilant 650mdaily Caragate 1gm/1042mrn -Medications previously tried: omeprazole  -Patient on very strict diet due to this - however diet as a whole seems pretty good. -Recommended to continue current medication Assessed patient finances. Dexilant copay high - will have patient assistance started for [employer].    Asthma (Goal: Prevent exacerbation) -Controlled -Current treatment  Albuterol HFA 68m63mrn -Medications previously tried: none noted -rarely has to use  -Recommended to continue current medication Denies any recent episodes of SOB.  Depression?? (Goal: Reduce symptoms) -Not ideally controlled, not assessed -Current treatment  None noted -Medications previously tried: Wellbutrin , Lexapro (nausea) PHQ9 SCORE ONLY 04/13/2021 02/24/2021 04/05/2020  PHQ-9 Total Score 10 0 8  -Patient had never gotten prescription filled, she still has it at home.  Believes she is still having some symptoms of depression. We discussed and patient was willing to try medication.  Discussed that it would take about 2 weeks for full effect to take place.  Patient asked for a three week check in to determine efficacy of medication. Will  have CMA call in 3 weeks to assess  Update 08/30/21 She was not able to tolerate Lexapro.  It made her nauseous.  She reports she is in some pain in her back that is causing her a lot of stress currently.  She is also still taking care of her husband and he is "not getting any better."       04/13/2021   11:42 AM 02/24/2021    2:33 PM 04/05/2020   10:09 AM  PHQ9 SCORE ONLY  PHQ-9 Total Score 10 0 8   PHQ-9 indicated moderate depression.  I do feel like if she controlled pain it would improve.  She is very sensitive to a lot of medications. Work to control pain, she is expecting call about her MRI soon.  If depression still exists can consider other medication alternatives. No changes a this time.  Patient Goals/Self-Care Activities Patient will:  - take medications as prescribed as evidenced by patient report and record review check glucose daily, document, and provide at future appointments check blood pressure a few times per week, document, and provide at future appointments target a minimum of 150 minutes of moderate intensity exercise weekly  Follow Up Plan: The care management team will reach out to the patient again over the next 120 days.  The patient verbalized understanding of instructions, educational materials, and care plan provided today and DECLINED offer to receive copy of patient instructions, educational materials, and care plan.  Telephone follow up appointment with pharmacy team member scheduled for: 6 months  Edythe Clarity, [place], PharmD Clinical Pharmacist  [facility] [phone]

## 2022-03-21 ENCOUNTER — Other Ambulatory Visit: Payer: Self-pay | Admitting: Family Medicine

## 2022-03-29 ENCOUNTER — Encounter: Payer: Self-pay | Admitting: Bariatrics

## 2022-03-29 ENCOUNTER — Encounter: Payer: Self-pay | Admitting: Family Medicine

## 2022-03-29 NOTE — Progress Notes (Signed)
Chief Complaint:   OBESITY Jessica [name] is here to discuss her progress with her obesity treatment plan along with follow-up of her obesity related diagnoses. Jessica [name] is on the Category 3 Plan and states she is following her eating plan approximately 45% of the time. Jessica [name] states she is doing 0 minutes 0 times per week.  Today's visit was #: 61 Starting weight: 246 lbs Starting date: 03/12/2019 Today's weight: 209 lbs Today's date: 03/15/2022 Total lbs lost to date: 37 Total lbs lost since last in-office visit: 0  Interim History: Jessica [name] is up 2 lbs  since her last visit. She is keeping her protein high.   Subjective:   1. Type 2 diabetes mellitus with obesity ([place]) Jessica [name] is stable on Trulicity at her current dose.   2. Essential hypertension Jessica [name] is taking her medications as directed. Her blood pressure is slightly elevated today.   Assessment/Plan:   1. Type 2 diabetes mellitus with obesity ([place]) Jessica [name] will continue her medications, and we will refill Trulicity and Zofran for 1 month.   - Dulaglutide (TRULICITY) 3.57 SV/7.7LT SOPN; INJECT 0.75 MG SUBCUTANEOUSLY ONE TIME PER WEEK  Dispense: 2 mL; Refill: 0 - ondansetron (ZOFRAN ODT) 4 MG disintegrating tablet; Take 1 tablet (4 mg total) by mouth every 8 (eight) hours as needed for nausea or vomiting.  Dispense: 30 tablet; Refill: 0  2. Essential hypertension Jessica [name] will continue her medications, and will continue to monitor her blood pressures at home. She will work on eliminating added salt.   3. Obesity, Current BMI 34.9 Jessica [name] is currently in the action stage of change. As such, her goal is to continue with weight loss efforts. She has agreed to the Category 3 Plan.   Meal planning and intentional eating were discussed.   Exercise goals: No exercise has been prescribed at this time.  Behavioral modification strategies: increasing lean protein intake, decreasing simple carbohydrates, increasing vegetables, increasing  water intake, decreasing eating out, no skipping meals, meal planning and cooking strategies, keeping healthy foods in the home, and planning for success.  Jessica [name] has agreed to follow-up with our clinic in 4 weeks. She was informed of the importance of frequent follow-up visits to maximize her success with intensive lifestyle modifications for her multiple health conditions.   Objective:   Blood pressure (!) 150/76, pulse 65, temperature 97.9 F (36.6 C), height 5' 5"  (1.651 m), weight 209 lb (94.8 kg), SpO2 98 %. Body mass index is 34.78 kg/m.  General: Cooperative, alert, well developed, in no acute distress. HEENT: Conjunctivae and lids unremarkable. Cardiovascular: Regular rhythm.  Lungs: Normal work of breathing. Neurologic: No focal deficits.   Lab Results  Component Value Date   CREATININE 0.95 06/09/2021   BUN 23 06/09/2021   NA 138 06/09/2021   K 4.6 06/09/2021   CL 102 06/09/2021   CO2 33 (H) 06/09/2021   Lab Results  Component Value Date   ALT 14 06/09/2021   AST 16 06/09/2021   ALKPHOS 69 06/09/2021   BILITOT 0.7 06/09/2021   Lab Results  Component Value Date   HGBA1C 6.6 (A) 02/08/2022   HGBA1C 6.3 (A) 10/19/2021   HGBA1C 7.7 (A) 04/13/2021   HGBA1C 8.2 (A) 01/12/2021   HGBA1C 8.1 (A) 08/12/2020   No results found for: "INSULIN" Lab Results  Component Value Date   TSH 0.91 04/13/2021   Lab Results  Component Value Date   CHOL 135 04/13/2021   HDL 59.70 04/13/2021   LDLCALC 65 04/13/2021  LDLDIRECT 143.2 11/23/2006   TRIG 50.0 04/13/2021   CHOLHDL 2 04/13/2021   Lab Results  Component Value Date   VD25OH 44.70 04/13/2021   VD25OH 85.0 09/07/2020   VD25OH 61.6 03/11/2020   Lab Results  Component Value Date   WBC 3.4 (L) 06/09/2021   HGB 14.7 06/09/2021   HCT 43.7 06/09/2021   MCV 99.0 06/09/2021   PLT 214.0 06/09/2021   Lab Results  Component Value Date   IRON 113 09/07/2020   TIBC 276 09/07/2020   FERRITIN 149 09/07/2020    Attestation Statements:   Reviewed by clinician on day of visit: allergies, medications, problem list, medical history, surgical history, family history, social history, and previous encounter notes.   Wilhemena Durie, am acting as Location manager for [employer], DO.  I have reviewed the above documentation for accuracy and completeness, and I agree with the above. [name], DO

## 2022-04-12 ENCOUNTER — Other Ambulatory Visit: Payer: Self-pay | Admitting: Bariatrics

## 2022-04-12 ENCOUNTER — Encounter: Payer: Self-pay | Admitting: Family Medicine

## 2022-04-12 ENCOUNTER — Ambulatory Visit (INDEPENDENT_AMBULATORY_CARE_PROVIDER_SITE_OTHER): Payer: Medicare Other | Admitting: Family Medicine

## 2022-04-12 VITALS — BP 130/62 | HR 73 | Temp 98.7°F | Ht 65.0 in | Wt 213.6 lb

## 2022-04-12 DIAGNOSIS — E1169 Type 2 diabetes mellitus with other specified complication: Secondary | ICD-10-CM

## 2022-04-12 DIAGNOSIS — K900 Celiac disease: Secondary | ICD-10-CM

## 2022-04-12 DIAGNOSIS — I10 Essential (primary) hypertension: Secondary | ICD-10-CM | POA: Diagnosis not present

## 2022-04-12 DIAGNOSIS — J4520 Mild intermittent asthma, uncomplicated: Secondary | ICD-10-CM

## 2022-04-12 DIAGNOSIS — E118 Type 2 diabetes mellitus with unspecified complications: Secondary | ICD-10-CM | POA: Diagnosis not present

## 2022-04-12 DIAGNOSIS — E669 Obesity, unspecified: Secondary | ICD-10-CM

## 2022-04-12 DIAGNOSIS — E782 Mixed hyperlipidemia: Secondary | ICD-10-CM

## 2022-04-12 DIAGNOSIS — Z794 Long term (current) use of insulin: Secondary | ICD-10-CM | POA: Diagnosis not present

## 2022-04-12 DIAGNOSIS — Z0000 Encounter for general adult medical examination without abnormal findings: Secondary | ICD-10-CM

## 2022-04-12 DIAGNOSIS — K3184 Gastroparesis: Secondary | ICD-10-CM

## 2022-04-12 DIAGNOSIS — Z23 Encounter for immunization: Secondary | ICD-10-CM

## 2022-04-12 DIAGNOSIS — E1142 Type 2 diabetes mellitus with diabetic polyneuropathy: Secondary | ICD-10-CM

## 2022-04-12 DIAGNOSIS — J309 Allergic rhinitis, unspecified: Secondary | ICD-10-CM

## 2022-04-12 DIAGNOSIS — E1143 Type 2 diabetes mellitus with diabetic autonomic (poly)neuropathy: Secondary | ICD-10-CM

## 2022-04-12 LAB — CBC WITH DIFFERENTIAL/PLATELET
Basophils Absolute: 0 10*3/uL (ref 0.0–0.1)
Basophils Relative: 0.8 % (ref 0.0–3.0)
Eosinophils Absolute: 0.2 10*3/uL (ref 0.0–0.7)
Eosinophils Relative: 4.8 % (ref 0.0–5.0)
HCT: 40.6 % (ref 36.0–46.0)
Hemoglobin: 13.6 g/dL (ref 12.0–15.0)
Lymphocytes Relative: 39.5 % (ref 12.0–46.0)
Lymphs Abs: 1.8 10*3/uL (ref 0.7–4.0)
MCHC: 33.5 g/dL (ref 30.0–36.0)
MCV: 100 fl (ref 78.0–100.0)
Monocytes Absolute: 0.3 10*3/uL (ref 0.1–1.0)
Monocytes Relative: 6.2 % (ref 3.0–12.0)
Neutro Abs: 2.2 10*3/uL (ref 1.4–7.7)
Neutrophils Relative %: 48.7 % (ref 43.0–77.0)
Platelets: 228 10*3/uL (ref 150.0–400.0)
RBC: 4.06 Mil/uL (ref 3.87–5.11)
RDW: 13 % (ref 11.5–15.5)
WBC: 4.6 10*3/uL (ref 4.0–10.5)

## 2022-04-12 LAB — COMPREHENSIVE METABOLIC PANEL
ALT: 15 U/L (ref 0–35)
AST: 18 U/L (ref 0–37)
Albumin: 4.1 g/dL (ref 3.5–5.2)
Alkaline Phosphatase: 88 U/L (ref 39–117)
BUN: 14 mg/dL (ref 6–23)
CO2: 30 mEq/L (ref 19–32)
Calcium: 9.4 mg/dL (ref 8.4–10.5)
Chloride: 101 mEq/L (ref 96–112)
Creatinine, Ser: 0.99 mg/dL (ref 0.40–1.20)
GFR: 57.36 mL/min — ABNORMAL LOW (ref >60.00)
Glucose, Bld: 212 mg/dL — ABNORMAL HIGH (ref 70–99)
Potassium: 4.5 mEq/L (ref 3.5–5.1)
Sodium: 136 mEq/L (ref 135–145)
Total Bilirubin: 0.5 mg/dL (ref 0.2–1.2)
Total Protein: 6.6 g/dL (ref 6.0–8.3)

## 2022-04-12 LAB — TSH: TSH: 1.04 u[IU]/mL (ref 0.35–5.50)

## 2022-04-12 LAB — LIPID PANEL
Cholesterol: 145 mg/dL (ref 0–200)
HDL: 54.4 mg/dL (ref >39.00)
LDL Cholesterol: 72 mg/dL (ref 0–99)
NonHDL: 90.87
Total CHOL/HDL Ratio: 3
Triglycerides: 93 mg/dL (ref 0.0–149.0)
VLDL: 18.6 mg/dL (ref 0.0–40.0)

## 2022-04-12 NOTE — Patient Instructions (Signed)
Please return in 6 months for recheck.   I will release your lab results to you on your MyChart account with further instructions. You may see the results before I do, but when I review them I will send you a message with my report or have my assistant call you if things need to be discussed. Please reply to my message with any questions. Thank you!   We will call you to get you set up with the allergy and asthma specialist.   If you have any questions or concerns, please don't hesitate to send me a message via MyChart or call the office at [phone]. Thank you for visiting with [place] today! It's our pleasure caring for you.

## 2022-04-12 NOTE — Progress Notes (Signed)
Subjective  Chief Complaint  Patient presents with   Annual Exam    Pt here for Annual exam and is not currently fasting     HPI: Jessica [name] is a 71 y.o. female who presents to [facility] [place] today for a Female Wellness Visit. She also has the concerns and/or needs as listed above in the chief complaint. These will be addressed in addition to the Health Maintenance Visit.   Wellness Visit: annual visit with health maintenance review and exam without Pap  HM: screens are current. Has diabetic eye exam scheduled for jan 2024. Sees multiple specialist and I reviewed notes from healthy weight and wellness, endocrine, pharmacy CCM, and gastro and prior acute PCP visit Chronic disease f/u and/or acute problem visit: (deemed necessary to be done in addition to the wellness visit): "Sinuses" : She was evaluated in August and treated for sinus infection with antibiotics.  Reports she had persistent sinus pain and congestion.  Then evaluated again a month ago and treated with another round of antibiotics.  She has been on allergy medicines and prednisone without any relief.  She reports that she has been needing to use her inhaler up to 3 times weekly.  Some wheezing.  Nonproductive cough.  She reports sinus congestion and sinus pressure.  Has history of chronic sinus infections and history of allergies.  Has not seen allergy or asthma specialist in many years.  No fevers or chills. DM; improved control. Dr. Renne Crigler. Can't tolerate higher dose trulicity Celiac HLD on statin. and due for recheck HTN is controlled.   Assessment  1. Annual physical exam   2. Essential hypertension   3. Type 2 diabetes mellitus with complication, with long-term current use of insulin ([place])   4. Combined hyperlipidemia associated with type 2 diabetes mellitus ([place])   5. Diabetic polyneuropathy associated with type 2 diabetes mellitus ([place])   6. Diabetic gastroparesis ([place])   7. Diabetic  autonomic neuropathy associated with type 2 diabetes mellitus ([place])   8. Celiac disease   9. Mild intermittent asthma without complication   10. Chronic allergic rhinitis      Plan  Female Wellness Visit: Age appropriate Health Maintenance and Prevention measures were discussed with patient. Included topics are cancer screening recommendations, ways to keep healthy (see AVS) including dietary and exercise recommendations, regular eye and dental care, use of seat belts, and avoidance of moderate alcohol use and tobacco use.  BMI: discussed patient's BMI and encouraged positive lifestyle modifications to help get to or maintain a target BMI. HM needs and immunizations were addressed and ordered. See below for orders. See HM and immunization section for updates. Routine labs and screening tests ordered including cmp, cbc and lipids where appropriate. Discussed recommendations regarding Vit D and calcium supplementation (see AVS)  Chronic disease management visit and/or acute problem visit: Sinus sxs: likely multifactorial. Rec sinus CT but pt defers for now. Allergy and asthma referral placed for further eval and medication adjustment.  Likely needs maintenance inhaler at this time.  Will likely need sinus CT and/or ENT to help clarify whether she has chronic sinusitis. Flu shot today Diabetes with improved control per endocrinology Hyperlipidemia on atorvastatin 40 due for recheck today with LFTs Hypertension is well-controlled on Benicar 40 daily Continue gabapentin for neuropathy  Follow up: 6 months for recheck Orders Placed This Encounter  Procedures   Ambulatory referral to Allergy   No orders of the defined types were placed in  this encounter.     Body mass index is 35.54 kg/m. Wt Readings from Last 3 Encounters:  04/12/22 213 lb 9.6 oz (96.9 kg)  03/15/22 209 lb (94.8 kg)  03/13/22 207 lb (93.9 kg)     Patient Active Problem List   Diagnosis Date Noted   Peripheral  sensory neuropathy due to type 2 diabetes mellitus ([place]) 04/05/2020    Priority: High   Celiac disease 02/14/2018    Priority: High   Diabetic neuropathy associated with type 2 diabetes mellitus ([place]) 02/14/2018    Priority: High   Class 2 obesity due to excess calories with body mass index (BMI) of 37.0 to 37.9 in adult 02/14/2018    Priority: High   Type 2 diabetes mellitus with complication, with long-term current use of insulin ([place]) 09/23/2010    Priority: High   Combined hyperlipidemia associated with type 2 diabetes mellitus ([place]) 01/29/2009    Priority: High   Essential hypertension 10/22/2006    Priority: High    Qualifier: Diagnosis of  By: Marca Ancona RMA, [place]      Osteopenia 02/14/2018    Priority: Medium     dexa 2019    Mild intermittent asthma 02/14/2018    Priority: Medium    Degenerative arthritis of knee, bilateral 10/29/2015    Priority: Medium     Bilateral steroid injections 12/02/2015 Started Orthovisc 12/16/2015 Monovisc given 04/07/2020    Degenerative cervical disc 07/27/2015    Priority: Medium    OSA (obstructive sleep apnea) 02/28/2011    Priority: Medium    Constipation, slow transit 09/23/2010    Priority: Medium    FH: colon cancer 09/23/2010    Priority: Medium    Personal history of colonic polyps 09/23/2010    Priority: Medium    Diabetic gastroparesis ([place]) 02/27/2007    Priority: Medium    Diabetic autonomic neuropathy associated with type 2 diabetes mellitus ([place]) 10/22/2006    Priority: Medium    Bilateral lower extremity edema 01/20/2020    Priority: Low   Fibroma of tongue 06/06/2016    Priority: Low   Renal calculi 01/13/2016    Priority: Low   Family history of malignant neoplasm of endometrium 10/08/2018   Trigger point of right shoulder region 08/02/2015    Injected 08/02/2015 injections given March 10, 2021    Health Maintenance  Topic Date Due   OPHTHALMOLOGY EXAM  04/07/2022   COVID-19 Vaccine (4 - 2023-24 season)  04/24/2022 (Originally 12/30/2021)   INFLUENZA VACCINE  07/30/2022 (Originally 11/29/2021)   Diabetic kidney evaluation - eGFR measurement  06/09/2022   MAMMOGRAM  07/23/2022   HEMOGLOBIN A1C  08/10/2022   Diabetic kidney evaluation - Urine ACR  10/20/2022   FOOT EXAM  10/20/2022   Medicare Annual Wellness (AWV)  03/14/2023   COLONOSCOPY (Pts 45-36yr Insurance coverage will need to be confirmed)  10/13/2024   DEXA SCAN  10/25/2025   DTaP/Tdap/Td (3 - Td or Tdap) 10/26/2027   Pneumonia Vaccine 71 Years old  Completed   Hepatitis C Screening  Completed   Zoster Vaccines- Shingrix  Completed   HPV VACCINES  Aged Out   Immunization History  Administered Date(s) Administered   Fluad Quad(high Dose 65+) 01/16/2019, 01/20/2020   Influenza,inj,Quad PF,6+ Mos 02/14/2018   Influenza-Unspecified 12/30/2020   PFIZER Comirnaty(Gray Top)Covid-19 Tri-Sucrose Vaccine 09/02/2020   PFIZER(Purple Top)SARS-COV-2 Vaccination 07/03/2019, 08/06/2019   Pneumococcal Conjugate-13 11/03/2015   Pneumococcal Polysaccharide-23 08/17/2010, 02/14/2018   Td 12/31/2002   Tdap 10/25/2017   Zoster Recombinat (  Shingrix) 11/05/2018, 01/28/2019   We updated and reviewed the patient's past history in detail and it is documented below. Allergies: Patient is allergic to codeine, gluten meal, metronidazole, nsaids, soy allergy, and wheat bran. Past Medical History Patient  has a past medical history of Anxiety, Arthritis, Asthma, Autonomic neuropathy, Cataract, Celiac disease, Constipation, Depression, Diabetes mellitus, Diabetic neuropathy ([place]), Diverticulosis, Fatty liver, Gastroparesis, GERD (gastroesophageal reflux disease), Hyperlipidemia, Hypertension, IBS (irritable bowel syndrome), Kidney stones, Personal history of colonic polyps (03/2010), and Shingles (2021). Past Surgical History Patient  has a past surgical history that includes Tubal ligation; Appendectomy; left breast cyst removal (2000); Dilatation &  currettage/hysteroscopy with resectoscope (N/A, 03/24/2013); Colonoscopy; Hysteroscopy with D & C (N/A, 11/08/2015); Cataract extraction (Right, 04/29/2018); Cataract extraction (Left, 03/2018); and LEFT HEART CATH AND CORONARY ANGIOGRAPHY (N/A, 10/31/2018). Family History: Patient family history includes COPD in her father; Colon cancer in her sister; Diabetes in her father and sister; Endometrial cancer in her sister; Heart attack in her father; Heart failure in her father; High blood pressure in her father; Hypertension in her brother and mother; Other in her father. Social History:  Patient  reports that she has never smoked. She has never used smokeless tobacco. She reports that she does not drink alcohol and does not use drugs.  Review of Systems: Constitutional: negative for fever or malaise Ophthalmic: negative for photophobia, double vision or loss of vision Cardiovascular: negative for chest pain, dyspnea on exertion, or new LE swelling Respiratory: negative for SOB or persistent cough Gastrointestinal: negative for abdominal pain, change in bowel habits or melena Genitourinary: negative for dysuria or gross hematuria, no abnormal uterine bleeding or disharge Musculoskeletal: negative for new gait disturbance or muscular weakness Integumentary: negative for new or persistent rashes, no breast lumps Neurological: negative for TIA or stroke symptoms Psychiatric: negative for SI or delusions Allergic/Immunologic: negative for hives  Patient Care Team    Relationship Specialty Notifications Start End  [name], MD PCP - General Family Medicine  02/14/18   Jettie Booze, MD PCP - Cardiology Cardiology Admissions 11/12/18   Olga Millers, MD Attending Physician Obstetrics and Gynecology  04/04/12   Renato Shin, MD (Inactive) Consulting Physician Endocrinology  02/14/18   Jerene Bears, MD Consulting Physician Gastroenterology  01/16/19   Stephannie Li, [place]  Physician Ophthalmology  01/16/19   Garrel Ridgel, DPM Consulting Physician Podiatry  01/16/19   Edythe Clarity, [facility] Pharmacist Pharmacist  03/04/21    Comment: [phone]    Objective  Vitals: BP 130/62   Pulse 73   Temp 98.7 F (37.1 C)   Ht _0  (1.651 m)   Wt 213 lb 9.6 oz (96.9 kg)   SpO2 99%   BMI 35.54 kg/m  General:  Well developed, well nourished, no acute distress  Psych:  Alert and orientedx3,normal mood and affect HEENT:  Normocephalic, atraumatic, non-icteric sclera,  supple neck without adenopathy, mass or thyromegaly Cardiovascular:  Normal S1, S2, RRR without gallop, rub or murmur Respiratory:  Good breath sounds bilaterally, CTAB with normal respiratory effort Gastrointestinal: normal bowel sounds, soft, non-tender, no noted masses. No HSM MSK: no deformities, contusions. Joints are without erythema or swelling.  Skin:  Warm, no rashes or suspicious lesions noted   Commons side effects, risks, benefits, and alternatives for medications and treatment plan prescribed today were discussed, and the patient expressed understanding of the given instructions. Patient is instructed to call or message via MyChart if he/she has any questions or  concerns regarding our treatment plan. No barriers to understanding were identified. We discussed Red Flag symptoms and signs in detail. Patient expressed understanding regarding what to do in case of urgent or emergency type symptoms.  Medication list was reconciled, printed and provided to the patient in AVS. Patient instructions and summary information was reviewed with the patient as documented in the AVS. This note was prepared with assistance of Dragon voice recognition software. Occasional wrong-word or sound-a-like substitutions may have occurred due to the inherent limitations of voice recognition software

## 2022-04-13 ENCOUNTER — Other Ambulatory Visit (INDEPENDENT_AMBULATORY_CARE_PROVIDER_SITE_OTHER): Payer: Self-pay | Admitting: Bariatrics

## 2022-04-13 ENCOUNTER — Ambulatory Visit (INDEPENDENT_AMBULATORY_CARE_PROVIDER_SITE_OTHER): Payer: Medicare Other | Admitting: Bariatrics

## 2022-04-13 ENCOUNTER — Encounter: Payer: Self-pay | Admitting: Bariatrics

## 2022-04-13 VITALS — BP 149/66 | HR 76 | Temp 98.0°F | Ht 65.0 in | Wt 207.0 lb

## 2022-04-13 DIAGNOSIS — E559 Vitamin D deficiency, unspecified: Secondary | ICD-10-CM | POA: Diagnosis not present

## 2022-04-13 DIAGNOSIS — E669 Obesity, unspecified: Secondary | ICD-10-CM | POA: Diagnosis not present

## 2022-04-13 DIAGNOSIS — E1169 Type 2 diabetes mellitus with other specified complication: Secondary | ICD-10-CM | POA: Diagnosis not present

## 2022-04-13 DIAGNOSIS — Z6834 Body mass index (BMI) 34.0-34.9, adult: Secondary | ICD-10-CM | POA: Diagnosis not present

## 2022-04-13 MED ORDER — TRULICITY 0.75 MG/0.5ML ~~LOC~~ SOAJ: SUBCUTANEOUS | 0 refills | Status: DC

## 2022-04-13 MED ORDER — VITAMIN D (ERGOCALCIFEROL) 1.25 MG (50000 UNIT) PO CAPS: 50000.0000 [IU] | ORAL_CAPSULE | ORAL | 0 refills | Status: DC

## 2022-04-13 MED ORDER — ONDANSETRON 4 MG PO TBDP: 4.0000 mg | ORAL_TABLET | Freq: Three times a day (TID) | ORAL | 0 refills | Status: DC | PRN

## 2022-04-17 NOTE — Progress Notes (Unsigned)
New Patient Note  RE: Jessica [name] MRN: [id number] DOB: 03-07-1951 Date of Office Visit: 04/18/2022  Consult requested by: [name], MD Primary care provider: [name], MD  Chief Complaint: No chief complaint on file.  History of Present Illness: I had the pleasure of seeing Jessica [name] for initial evaluation at the [facility] on 04/17/2022. She is a 71 y.o. female, who is referred here by [name], MD for the evaluation of allergic rhinitis and asthma.  She reports symptoms of ***. Symptoms have been going on for *** years. The symptoms are present *** all year around with worsening in ***. Other triggers include exposure to ***. Anosmia: ***. Headache: ***. She has used *** with ***fair improvement in symptoms. Sinus infections: ***. Previous work up includes: ***. Previous ENT evaluation: ***. Previous sinus imaging: ***. History of nasal polyps: ***. Last eye exam: ***. History of reflux: ***.  She reports symptoms of *** chest tightness, shortness of breath, coughing, wheezing, nocturnal awakenings for *** years. Current medications include *** which help. She reports *** using aerochamber with inhalers. She tried the following inhalers: ***. Main triggers are ***allergies, infections, weather changes, smoke, exercise, pet exposure. In the last month, frequency of symptoms: ***x/week. Frequency of nocturnal symptoms: ***x/month. Frequency of SABA use: ***x/week. Interference with physical activity: ***. Sleep is ***disturbed. In the last 12 months, emergency room visits/urgent care visits/doctor office visits or hospitalizations due to respiratory issues: ***. In the last 12 months, oral steroids courses: ***. Lifetime history of hospitalization for respiratory issues: ***. Prior intubations: ***. Asthma was diagnosed at age *** by ***. History of pneumonia: ***. She was evaluated by allergist ***pulmonologist in the past. Smoking exposure:  ***. Up to date with flu vaccine: ***. Up to date with pneumonia vaccine: ***. Up to date with COVID-19 vaccine: ***. Prior Covid-19 infection: ***. History of reflux: ***.  Assessment and Plan: [name] is a 71 y.o. female with: No problem-specific Assessment & Plan notes found for this encounter.  No follow-ups on file.  No orders of the defined types were placed in this encounter.  Lab Orders  No laboratory test(s) ordered today    Other allergy screening: Asthma: {Blank single:19197::"yes","no"} Rhino conjunctivitis: {Blank single:19197::"yes","no"} Food allergy: {Blank single:19197::"yes","no"} Medication allergy: {Blank single:19197::"yes","no"} Hymenoptera allergy: {Blank single:19197::"yes","no"} Urticaria: {Blank single:19197::"yes","no"} Eczema:{Blank single:19197::"yes","no"} History of recurrent infections suggestive of immunodeficency: {Blank single:19197::"yes","no"}  Diagnostics: Spirometry:  Tracings reviewed. Her effort: {Blank single:19197::"Good reproducible efforts.","It was hard to get consistent efforts and there is a question as to whether this reflects a maximal maneuver.","Poor effort, data can not be interpreted."} FVC: ***L FEV1: ***L, ***% predicted FEV1/FVC ratio: ***% Interpretation: {Blank single:19197::"Spirometry consistent with mild obstructive disease","Spirometry consistent with moderate obstructive disease","Spirometry consistent with severe obstructive disease","Spirometry consistent with possible restrictive disease","Spirometry consistent with mixed obstructive and restrictive disease","Spirometry uninterpretable due to technique","Spirometry consistent with normal pattern","No overt abnormalities noted given today's efforts"}.  Please see scanned spirometry results for details.  Skin Testing: {Blank single:19197::"Select foods","Environmental allergy panel","Environmental allergy panel and select foods","Food allergy panel","None","Deferred due  to recent antihistamines use"}. *** Results discussed with patient/family.   Past Medical History: Patient Active Problem List   Diagnosis Date Noted  . Vitamin D deficiency 04/13/2022  . Type 2 diabetes mellitus with obesity ([place]) 04/13/2022  . Class 3 severe obesity with serious comorbidity and body mass index (BMI) of 40.0 to 44.9 in adult ([place]) 04/13/2022  . Peripheral sensory neuropathy due to  type 2 diabetes mellitus ([place]) 04/05/2020  . Bilateral lower extremity edema 01/20/2020  . Family history of malignant neoplasm of endometrium 10/08/2018  . Celiac disease 02/14/2018  . Osteopenia 02/14/2018  . Mild intermittent asthma 02/14/2018  . Diabetic neuropathy associated with type 2 diabetes mellitus ([place]) 02/14/2018  . Class 2 obesity due to excess calories with body mass index (BMI) of 37.0 to 37.9 in adult 02/14/2018  . Fibroma of tongue 06/06/2016  . Renal calculi 01/13/2016  . Degenerative arthritis of knee, bilateral 10/29/2015  . Trigger point of right shoulder region 08/02/2015  . Degenerative cervical disc 07/27/2015  . OSA (obstructive sleep apnea) 02/28/2011  . Constipation, slow transit 09/23/2010  . Type 2 diabetes mellitus with complication, with long-term current use of insulin ([place]) 09/23/2010  . FH: colon cancer 09/23/2010  . Personal history of colonic polyps 09/23/2010  . Combined hyperlipidemia associated with type 2 diabetes mellitus ([place]) 01/29/2009  . Diabetic gastroparesis ([place]) 02/27/2007  . Diabetic autonomic neuropathy associated with type 2 diabetes mellitus ([place]) 10/22/2006  . Essential hypertension 10/22/2006   Past Medical History:  Diagnosis Date  . Anxiety   . Arthritis    bilateral knees  . Asthma    childhood  . Autonomic neuropathy    diabetic  . Cataract   . Celiac disease   . Constipation   . Depression   . Diabetes mellitus    type II, Hemoglobin A1C 9.9 10/05/2011  . Diabetic neuropathy ([place])   . Diverticulosis   . Fatty  liver   . Gastroparesis   . GERD (gastroesophageal reflux disease)   . Hyperlipidemia   . Hypertension   . IBS (irritable bowel syndrome)   . Kidney stones   . Personal history of colonic polyps 03/2010   hyperplastic  . Shingles 2021   Past Surgical History: Past Surgical History:  Procedure Laterality Date  . APPENDECTOMY    . CATARACT EXTRACTION Right 04/29/2018  . CATARACT EXTRACTION Left 03/2018  . COLONOSCOPY    . DILATATION & CURRETTAGE/HYSTEROSCOPY WITH RESECTOCOPE N/A 03/24/2013   Procedure: DILATATION & CURETTAGE, HYSTEROSCOPY WITH RESECTION;  Surgeon: Olga Millers, MD;  Location: [facility];  Service: Gynecology;  Laterality: N/A;  . HYSTEROSCOPY WITH D & C N/A 11/08/2015   Procedure: DILATATION AND CURETTAGE /HYSTEROSCOPY;  Surgeon: Olga Millers, MD;  Location: [facility];  Service: Gynecology;  Laterality: N/A;  . left breast cyst removal  2000  . LEFT HEART CATH AND CORONARY ANGIOGRAPHY N/A 10/31/2018   Procedure: LEFT HEART CATH AND CORONARY ANGIOGRAPHY;  Surgeon: Jettie Booze, MD;  Location: [facility];  Service: Cardiovascular;  Laterality: N/A;  . TUBAL LIGATION     Medication List:  Current Outpatient Medications  Medication Sig Dispense Refill  . albuterol (VENTOLIN HFA) 108 (90 Base) MCG/ACT inhaler TAKE 2 PUFFS BY MOUTH EVERY 6 HOURS AS NEEDED FOR WHEEZE OR SHORTNESS OF BREATH 6.7 each 3  . aspirin EC 81 MG tablet Take 81 mg by mouth daily. Swallow whole.    Marland Kitchen atorvastatin (LIPITOR) 40 MG tablet TAKE 1 TABLET BY MOUTH EVERY DAY 90 tablet 3  . BD INSULIN SYRINGE U/F 31G X 5/16" 1 ML MISC USE UP TO 3 TIMES A DAY 100 each 5  . dexlansoprazole (DEXILANT) 60 MG capsule TAKE 1 CAPSULE BY MOUTH EVERY DAY 90 capsule 3  . Dulaglutide (TRULICITY) 3.24 MW/1.0UV SOPN INJECT 0.75 MG SUBCUTANEOUSLY ONE TIME PER WEEK 2 mL 0  . DULoxetine (CYMBALTA) 20  MG capsule TAKE 1 CAPSULE BY MOUTH EVERY DAY 90 capsule 1  . fluticasone (FLONASE) 50 MCG/ACT nasal spray Place  1 spray into both nostrils daily. 16 g 6  . furosemide (LASIX) 20 MG tablet TAKE 1 TABLET (20 MG TOTAL) BY MOUTH DAILY AS NEEDED FOR FLUID OR EDEMA. 90 tablet 3  . gabapentin (NEURONTIN) 600 MG tablet TAKE 1 TABLET IN THE MORNING AND 2 TABLETS AT BEDTIME 270 tablet 2  . glucose blood (ONETOUCH ULTRA) test strip USE 2 TIMES DAILY. AND LANCETS 2/DAY 200 strip 3  . ibuprofen (ADVIL) 200 MG tablet Take 200 mg by mouth daily.    . insulin NPH-regular Human (HUMULIN 70/30) (70-30) 100 UNIT/ML injection INJECT 32 UNITS WITH BREAKFAST, AND 14 UNITS WITH SUPPER. 60 mL 3  . ketoconazole (NIZORAL) 2 % cream APPLY TWICE A DAY FOR 2 WEEKS AS NEEDED FOR RASH 30 g 2  . olmesartan (BENICAR) 40 MG tablet TAKE 1 TABLET BY MOUTH EVERY DAY 90 tablet 3  . ondansetron (ZOFRAN ODT) 4 MG disintegrating tablet Take 1 tablet (4 mg total) by mouth every 8 (eight) hours as needed for nausea or vomiting. 30 tablet 0  . polyethylene glycol powder ([facility] PURELAX) 17 GM/SCOOP powder DISSOLVE 1 CAPFUL IN AT LEAST 8 OUNCES WATER/JUICE AND DRINK TWICE DAILY 1020 g 5  . sucralfate (CARAFATE) 1 GM/10ML suspension Take 10 mLs (1 g total) by mouth 4 (four) times daily. 420 mL 1  . tiZANidine (ZANAFLEX) 2 MG tablet Take 1 tablet (2 mg total) by mouth at bedtime. 30 tablet 0  . valACYclovir (VALTREX) 500 MG tablet TAKE 1 TABLET BY MOUTH EVERY DAY 90 tablet 3  . Vitamin D, Ergocalciferol, (DRISDOL) 1.25 MG (50000 UNIT) CAPS capsule Take 1 capsule (50,000 Units total) by mouth every 7 (seven) days. 5 capsule 0   No current facility-administered medications for this visit.   Allergies: Allergies  Allergen Reactions  . Codeine     Other reaction(s): vomiting  . Gluten Meal Other (See Comments)    No wheat or soy  . Metronidazole     Other reaction(s): vomiting  . Nsaids Nausea And Vomiting and Other (See Comments)    Cannot tolerate large doses  Other reaction(s): vomiting Other reaction(s): vomiting  . Soy Allergy   . Wheat Bran     Social History: Social History   Socioeconomic History  . Marital status: Married    Spouse name: Devar Hellenbrand  . Number of children: 2  . Years of education: Not on file  . Highest education level: Not on file  Occupational History  . 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 Occupation: retired  Tobacco Use  . Smoking status: Never  . Smokeless tobacco: Never  Vaping Use  . Vaping Use: Never used  Substance and Sexual Activity  . Alcohol use: No  . Drug use: No  . Sexual activity: Yes    Birth control/protection: Post-menopausal, Surgical  Other Topics Concern  . Not on file  Social History Narrative  . Not on file   Social Determinants of Health   Financial Resource Strain: Low Risk  (03/13/2022)   Overall Financial Resource Strain (CARDIA)   . Difficulty of Paying Living Expenses: Not hard at all  Food Insecurity: No Food Insecurity (03/13/2022)   Hunger Vital Sign   . Worried About Charity fundraiser in the Last Year: Never true   . Ran Out  of Food in the Last Year: Never true  Transportation Needs: No Transportation Needs (03/13/2022)   PRAPARE - Transportation   . Lack of Transportation (Medical): No   . Lack of Transportation (Non-Medical): No  Physical Activity: Inactive (03/13/2022)   Exercise Vital Sign   . Days of Exercise per Week: 0 days   . Minutes of Exercise per Session: 0 min  Stress: Stress Concern Present (03/13/2022)   [place]   . Feeling of Stress : To some extent  Social Connections: Moderately Integrated (03/13/2022)   Social Connection and Isolation Panel [NHANES]   . Frequency of Communication with Friends and Family: More than three times a week   . Frequency of Social Gatherings with Friends and Family: Once a week   . Attends Religious Services: More than 4 times per year   . Active Member of Clubs or  Organizations: No   . Attends Archivist Meetings: Never   . Marital Status: Married   Lives in a ***. Smoking: *** Occupation: ***  Environmental HistoryFreight forwarder in the house: Estate agent in the family room: {Blank single:19197::"yes","no"} Carpet in the bedroom: {Blank single:19197::"yes","no"} Heating: {Blank single:19197::"electric","gas","heat pump"} Cooling: {Blank single:19197::"central","window","heat pump"} Pet: {Blank single:19197::"yes ***","no"}  Family History: Family History  Problem Relation Age of Onset  . Hypertension Mother   . Colon cancer Sister        dx in her early 39s  . Diabetes Sister   . Endometrial cancer Sister   . Hypertension Brother   . Other Father        2 collapsed lungs  . COPD Father   . Diabetes Father   . Heart attack Father   . Heart failure Father   . High blood pressure Father   . Colon polyps Neg Hx   . Esophageal cancer Neg Hx   . Stomach cancer Neg Hx    Problem                               Relation Asthma                                   *** Eczema                                *** Food allergy                          *** Allergic rhino conjunctivitis     ***  Review of Systems  Constitutional:  Negative for appetite change, chills, fever and unexpected weight change.  HENT:  Negative for congestion and rhinorrhea.   Eyes:  Negative for itching.  Respiratory:  Negative for cough, chest tightness, shortness of breath and wheezing.   Cardiovascular:  Negative for chest pain.  Gastrointestinal:  Negative for abdominal pain.  Genitourinary:  Negative for difficulty urinating.  Skin:  Negative for rash.  Neurological:  Negative for headaches.   Objective: There were no vitals taken for this visit. There is no height or weight on file to calculate BMI. Physical Exam Vitals and nursing note reviewed.  Constitutional:      Appearance: Normal appearance. She is  well-developed.  HENT:  Head: Normocephalic and atraumatic.     Right Ear: Tympanic membrane and external ear normal.     Left Ear: Tympanic membrane and external ear normal.     Nose: Nose normal.     Mouth/Throat:     Mouth: Mucous membranes are moist.     Pharynx: Oropharynx is clear.  Eyes:     Conjunctiva/sclera: Conjunctivae normal.  Cardiovascular:     Rate and Rhythm: Normal rate and regular rhythm.     Heart sounds: Normal heart sounds. No murmur heard.    No friction rub. No gallop.  Pulmonary:     Effort: Pulmonary effort is normal.     Breath sounds: Normal breath sounds. No wheezing, rhonchi or rales.  Musculoskeletal:     Cervical back: Neck supple.  Skin:    General: Skin is warm.     Findings: No rash.  Neurological:     Mental Status: She is alert and oriented to person, place, and time.  Psychiatric:        Behavior: Behavior normal.  The plan was reviewed with the patient/family, and all questions/concerned were addressed.  It was my pleasure to see Rakiyah today and participate in her care. Please feel free to contact me with any questions or concerns.  Sincerely,  Rexene Alberts, DO Allergy & Immunology  [facility] office: [place] office: [phone]

## 2022-04-18 ENCOUNTER — Telehealth: Payer: Self-pay

## 2022-04-18 ENCOUNTER — Ambulatory Visit (INDEPENDENT_AMBULATORY_CARE_PROVIDER_SITE_OTHER): Payer: Medicare Other | Admitting: Allergy

## 2022-04-18 ENCOUNTER — Encounter: Payer: Self-pay | Admitting: Allergy

## 2022-04-18 VITALS — BP 140/66 | HR 64 | Temp 98.1°F | Resp 18 | Ht 65.0 in | Wt 214.0 lb

## 2022-04-18 DIAGNOSIS — J310 Chronic rhinitis: Secondary | ICD-10-CM

## 2022-04-18 DIAGNOSIS — J4530 Mild persistent asthma, uncomplicated: Secondary | ICD-10-CM | POA: Diagnosis not present

## 2022-04-18 DIAGNOSIS — K219 Gastro-esophageal reflux disease without esophagitis: Secondary | ICD-10-CM | POA: Diagnosis not present

## 2022-04-18 DIAGNOSIS — K900 Celiac disease: Secondary | ICD-10-CM

## 2022-04-18 MED ORDER — AIRSUPRA 90-80 MCG/ACT IN AERO: 2.0000 | INHALATION_SPRAY | RESPIRATORY_TRACT | 2 refills | Status: DC | PRN

## 2022-04-18 MED ORDER — RYALTRIS 665-25 MCG/ACT NA SUSP: 1.0000 | Freq: Two times a day (BID) | NASAL | 5 refills | Status: DC

## 2022-04-18 NOTE — Assessment & Plan Note (Addendum)
Diagnosed with asthma many years ago and recently has been needing to use albuterol about 3 times per week for wheezing. Today's spirometry shows some restriction with no improvement in FEV1 but there was a 23% improvement in the small airways post bronchodilator treatment.  Clinically feeling unchanged. Discussed a trial of daily ICS inhaler versus trying the new rescue inhaler which contains steroids. Will try Airsupra first.  May use Airsupra rescue inhaler 2 puffs every 4 to 6 hours as needed for shortness of breath, chest tightness, coughing, and wheezing. Do not use more than 12 puffs in 24 hours. May use Airsupra rescue inhaler 2 puffs 5 to 15 minutes prior to strenuous physical activities. Monitor frequency of use. Rinse mouth after each use.  Coupon given. Sample given.

## 2022-04-18 NOTE — Assessment & Plan Note (Signed)
Perennial rhinitis symptoms which have flared the last 4 months.  Had 2 courses of antibiotics and prednisone with no benefit.  2017 skin testing was positive to multiple molds, grass, weed, trees and cat.  No prior AIT.  No prior ENT evaluation.  Zyrtec, Claritin and Flonase ineffective. Today's skin testing showed: Negative to indoor/outdoor allergens and common foods.  Start Ryaltris (olopatadine + mometasone nasal spray combination) 1-2 sprays per nostril twice a day. Sample given. This replaces your other nasal sprays. If this works well for you, then have [facility] ship the medication to your home - prescription already sent in.  Nasal saline spray (i.e., Simply Saline) or nasal saline lavage (i.e., NeilMed) is recommended as needed and prior to medicated nasal sprays. Will refer to ENT for chronic non-allergic rhinitis.

## 2022-04-18 NOTE — Assessment & Plan Note (Signed)
Patient is not sure if PND is making reflux worse but symptoms more aggravated these days. See handout for lifestyle and dietary modifications. Continue Dexilant 72m once a day. Nothing to eat or drink for 30 minutes afterwards. If not doing better, follow up with GI.

## 2022-04-18 NOTE — Assessment & Plan Note (Addendum)
Continue to avoid gluten and soy (apparently this also causes issues for her).

## 2022-04-18 NOTE — Telephone Encounter (Signed)
Pt needs referral to [facility] per Dr Maudie Mercury for non allergic rhinitis

## 2022-04-18 NOTE — Patient Instructions (Addendum)
Today's skin testing showed: Negative to indoor/outdoor allergens and common foods.   Results given.  Non-allergic rhinitis Start Ryaltris (olopatadine + mometasone nasal spray combination) 1-2 sprays per nostril twice a day. Sample given. This replaces your other nasal sprays. If this works well for you, then have [facility] ship the medication to your home - prescription already sent in.  Nasal saline spray (i.e., Simply Saline) or nasal saline lavage (i.e., NeilMed) is recommended as needed and prior to medicated nasal sprays. Will refer to ENT for chronic non-allergic rhinitis.   Breathing May use Airsupra rescue inhaler 2 puffs every 4 to 6 hours as needed for shortness of breath, chest tightness, coughing, and wheezing. Do not use more than 12 puffs in 24 hours. May use Airsupra rescue inhaler 2 puffs 5 to 15 minutes prior to strenuous physical activities. Monitor frequency of use. Rinse mouth after each use.  Coupon given. Sample given. Breathing control goals:  Full participation in all desired activities (may need albuterol before activity) Albuterol use two times or less a week on average (not counting use with activity) Cough interfering with sleep two times or less a month Oral steroids no more than once a year No hospitalizations    Heartburn: See handout for lifestyle and dietary modifications. Continue Dexilant 76m once a day. Nothing to eat or drink for 30 minutes afterwards. If not doing better, follow up with GI.  High blood pressure Monitor blood pressure - slightly elevated today 140/66.  Food Continue to avoid gluten and soy.  Follow up in 2 months or sooner if needed.

## 2022-04-20 ENCOUNTER — Telehealth: Payer: Self-pay | Admitting: Allergy

## 2022-04-20 ENCOUNTER — Encounter: Payer: Self-pay | Admitting: Allergy

## 2022-04-20 NOTE — Telephone Encounter (Signed)
Please call patient.  Delayed reactions like that typically do not correlate with clinical utility. I will make a note that she had delayed intradermal redness/itching at home after the testing.  Can she send a picture via mychart of her arms?  If she's really itchy, she can use some otc benadryl and hydrocortisone cream as needed.   Thank you

## 2022-04-20 NOTE — Telephone Encounter (Signed)
Pt informed and will send pic soon as she gets home

## 2022-04-20 NOTE — Telephone Encounter (Signed)
Patient called stating she had testing done on Tuesday. She had intradermals completed in her left arm & states that her arm has red marks all over. She states there are 4 prominent marks that are red, splotchy and itchy. Patient states she was told she tested negative for everything but is now wondering if she didn't stay long enough for them to read the test.   Best contact number: [phone]

## 2022-04-25 ENCOUNTER — Encounter: Payer: Self-pay | Admitting: Bariatrics

## 2022-04-25 NOTE — Progress Notes (Signed)
Chief Complaint:   OBESITY Jessica [name] is here to discuss her progress with her obesity treatment plan along with follow-up of her obesity related diagnoses. Jessica [name] is on the Category 3 Plan and states she is following her eating plan approximately 95% of the time. Jessica [name] states she is doing 0 minutes 0 times per week.  Today's visit was #: 78 Starting weight: 246 lbs Starting date: 03/12/2019 Today's weight: 207 lbs Today's date: 04/13/2022 Total lbs lost to date: 39 Total lbs lost since last in-office visit: 2  Interim History: Jessica [name] is down 2 pounds since her last visit.  Subjective:   1. Vitamin D deficiency Jessica [name] is taking prescription vitamin D.  2. Type 2 diabetes mellitus with obesity (Jessica [name]) Jessica [name]'s last A1c was 6.6 on 02/08/2022.  She notes more constipation, and she is taking MiraLAX daily..  Assessment/Plan:   1. Vitamin D deficiency We will refill prescription vitamin D 50,000 IU every week for 1 month.  - Vitamin D, Ergocalciferol, (DRISDOL) 1.25 MG (50000 UNIT) CAPS capsule; Take 1 capsule (50,000 Units total) by mouth every 7 (seven) days.  Dispense: 5 capsule; Refill: 0  2. Type 2 diabetes mellitus with obesity ([facility]) Trulicity 1.61 mg once weekly was sent in earlier.  We will refill Zofran 4 mg for 1 month.  Lakie will continue MiraLAX and add Colace.  She may use suppositories as needed.  She is to work on increasing her fruit, vegetable, and water intake.  - ondansetron (ZOFRAN ODT) 4 MG disintegrating tablet; Take 1 tablet (4 mg total) by mouth every 8 (eight) hours as needed for nausea or vomiting.  Dispense: 30 tablet; Refill: 0  3. Obesity, Current BMI 34.4 Jessica [name] is currently in the action stage of change. As such, her goal is to continue with weight loss efforts. She has agreed to the Category 3 Plan.   Meal planning and intention eating were discussed.  Exercise goals: No exercise has been prescribed at this time.  Behavioral modification  strategies: increasing lean protein intake, decreasing simple carbohydrates, increasing vegetables, increasing water intake, decreasing eating out, no skipping meals, meal planning and cooking strategies, keeping healthy foods in the home, and planning for success.  Jessica [name] has agreed to follow-up with our clinic in 4 weeks. She was informed of the importance of frequent follow-up visits to maximize her success with intensive lifestyle modifications for her multiple health conditions.   Objective:   Blood pressure (!) 149/66, pulse 76, temperature 98 F (36.7 C), height 5' 5"  (1.651 m), weight 207 lb (93.9 kg), SpO2 99 %. Body mass index is 34.45 kg/m.  General: Cooperative, alert, well developed, in no acute distress. HEENT: Conjunctivae and lids unremarkable. Cardiovascular: Regular rhythm.  Lungs: Normal work of breathing. Neurologic: No focal deficits.   Lab Results  Component Value Date   CREATININE 0.99 04/12/2022   BUN 14 04/12/2022   NA 136 04/12/2022   K 4.5 04/12/2022   CL 101 04/12/2022   CO2 30 04/12/2022   Lab Results  Component Value Date   ALT 15 04/12/2022   AST 18 04/12/2022   ALKPHOS 88 04/12/2022   BILITOT 0.5 04/12/2022   Lab Results  Component Value Date   HGBA1C 6.6 (A) 02/08/2022   HGBA1C 6.3 (A) 10/19/2021   HGBA1C 7.7 (A) 04/13/2021   HGBA1C 8.2 (A) 01/12/2021   HGBA1C 8.1 (A) 08/12/2020   No results found for: "INSULIN" Lab Results  Component Value Date   TSH 1.04 04/12/2022  Lab Results  Component Value Date   CHOL 145 04/12/2022   HDL 54.40 04/12/2022   LDLCALC 72 04/12/2022   LDLDIRECT 143.2 11/23/2006   TRIG 93.0 04/12/2022   CHOLHDL 3 04/12/2022   Lab Results  Component Value Date   VD25OH 44.70 04/13/2021   VD25OH 85.0 09/07/2020   VD25OH 61.6 03/11/2020   Lab Results  Component Value Date   WBC 4.6 04/12/2022   HGB 13.6 04/12/2022   HCT 40.6 04/12/2022   MCV 100.0 04/12/2022   PLT 228.0 04/12/2022   Lab Results   Component Value Date   IRON 113 09/07/2020   TIBC 276 09/07/2020   FERRITIN 149 09/07/2020   Attestation Statements:   Reviewed by clinician on day of visit: allergies, medications, problem list, medical history, surgical history, family history, social history, and previous encounter notes.   Wilhemena Durie, am acting as Location manager for [employer], DO.  I have reviewed the above documentation for accuracy and completeness, and I agree with the above. [name], DO

## 2022-04-28 NOTE — Progress Notes (Signed)
[name] [address] [place] [address] Phone: [phone] Subjective:   Bruce Donath, am serving as a scribe for Dr. Antoine Primas.  I'm seeing this patient by the request  of:  Willow Ora, MD  CC: Bilateral knee pain  [id number]  02/21/2022 Significant arthritic changes of the neck.  Patient does have more of a polyarthralgia.  Started patient on a very low-dose of Cymbalta 20 mg.  Follow-up with me again in 6 to 8 weeks      Viscosupplementation given bilaterally.  Chronic problem with worsening symptoms.  Social determinants of health including patient's other comorbidities including diabetes discussed with icing regimen and home exercises.  Discussed which activities to do and which ones to avoid.  Increase activity slowly over the course the next several weeks.  Follow-up with me again in 6 weeks.      Update 05/04/2022 Caasi Searcy Marcon is a 71 y.o. female coming in with complaint of neck and B knee pain. Visco given last visit. Patient states that injections in knees did not help.   R ankle pain has increased over past few weeks. Worse at night.   Neck pain has been ok.       Past Medical History:  Diagnosis Date   Anxiety    Arthritis    bilateral knees   Asthma    childhood   Autonomic neuropathy    diabetic   Cataract    Celiac disease    Constipation    Depression    Diabetes mellitus    type II, Hemoglobin A1C 9.9 10/05/2011   Diabetic neuropathy (HCC)    Diverticulosis    Fatty liver    Gastroparesis    GERD (gastroesophageal reflux disease)    Hyperlipidemia    Hypertension    IBS (irritable bowel syndrome)    Kidney stones    Personal history of colonic polyps 03/2010   hyperplastic   Shingles 2021   Past Surgical History:  Procedure Laterality Date   APPENDECTOMY     CATARACT EXTRACTION Right 04/29/2018   CATARACT EXTRACTION Left 03/2018   COLONOSCOPY     DILATATION &  CURRETTAGE/HYSTEROSCOPY WITH RESECTOCOPE N/A 03/24/2013   Procedure: DILATATION & CURETTAGE, HYSTEROSCOPY WITH RESECTION;  Surgeon: Levi Aland, MD;  Location: [facility];  Service: Gynecology;  Laterality: N/A;   HYSTEROSCOPY WITH D & C N/A 11/08/2015   Procedure: DILATATION AND CURETTAGE /HYSTEROSCOPY;  Surgeon: Levi Aland, MD;  Location: [facility];  Service: Gynecology;  Laterality: N/A;   left breast cyst removal  2000   LEFT HEART CATH AND CORONARY ANGIOGRAPHY N/A 10/31/2018   Procedure: LEFT HEART CATH AND CORONARY ANGIOGRAPHY;  Surgeon: Corky Crafts, MD;  Location: [facility] CV LAB;  Service: Cardiovascular;  Laterality: N/A;   TUBAL LIGATION     Social History   Socioeconomic History   Marital status: Married    Spouse name: Devar Grimmer   Number of children: 2   Years of education: Not on file   Highest education level: Not on file  Occupational History   Occupation: Conservation officer, 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    Comment: parttime   Occupation: retired  Tobacco Use   Smoking status: Never   Smokeless tobacco: Never  Vaping Use   Vaping Use: Never used  Substance and Sexual Activity   Alcohol use: No   Drug use: No   Sexual activity: Yes  Birth control/protection: Post-menopausal, Surgical  Other Topics Concern   Not on file  Social History Narrative   Not on file   Social Determinants of Health   Financial Resource Strain: Low Risk  (03/13/2022)   Overall Financial Resource Strain (CARDIA)    Difficulty of Paying Living Expenses: Not hard at all  Food Insecurity: No Food Insecurity (03/13/2022)   Hunger Vital Sign    Worried About Running Out of Food in the Last Year: Never true    Ran Out of Food in the Last Year: Never true  Transportation Needs: No Transportation Needs (03/13/2022)   PRAPARE - Administrator, Civil Service (Medical): No    Lack of Transportation (Non-Medical): No  Physical Activity: Inactive (03/13/2022)    Exercise Vital Sign    Days of Exercise per Week: 0 days    Minutes of Exercise per Session: 0 min  Stress: Stress Concern Present (03/13/2022)   [employer] of Occupational Health - Occupational Stress Questionnaire    Feeling of Stress : To some extent  Social Connections: Moderately Integrated (03/13/2022)   Social Connection and Isolation Panel [NHANES]    Frequency of Communication with Friends and Family: More than three times a week    Frequency of Social Gatherings with Friends and Family: Once a week    Attends Religious Services: More than 4 times per year    Active Member of [employer] or Organizations: No    Attends Engineer, structural: Never    Marital Status: Married   Allergies  Allergen Reactions   Codeine     Other reaction(s): vomiting   Gluten Meal Other (See Comments)    No wheat or soy   Metronidazole     Other reaction(s): vomiting   Nsaids Nausea And Vomiting and Other (See Comments)    Cannot tolerate large doses  Other reaction(s): vomiting Other reaction(s): vomiting   Soy Allergy    Wheat Bran    Family History  Problem Relation Age of Onset   Hypertension Mother    Colon cancer Sister        dx in her early 68s   Diabetes Sister    Endometrial cancer Sister    Hypertension Brother    Other Father        2 collapsed lungs   COPD Father    Diabetes Father    Heart attack Father    Heart failure Father    High blood pressure Father    Colon polyps Neg Hx    Esophageal cancer Neg Hx    Stomach cancer Neg Hx     Current Outpatient Medications (Endocrine & Metabolic):    Dulaglutide (TRULICITY) 0.75 MG/0.5ML SOPN, INJECT 0.75 MG SUBCUTANEOUSLY ONE TIME PER WEEK   insulin NPH-regular Human (HUMULIN 70/30) (70-30) 100 UNIT/ML injection, INJECT 32 UNITS WITH BREAKFAST, AND 14 UNITS WITH SUPPER.  Current Outpatient Medications (Cardiovascular):    atorvastatin (LIPITOR) 40 MG tablet, TAKE 1 TABLET BY MOUTH EVERY DAY   furosemide  (LASIX) 20 MG tablet, TAKE 1 TABLET (20 MG TOTAL) BY MOUTH DAILY AS NEEDED FOR FLUID OR EDEMA.   olmesartan (BENICAR) 40 MG tablet, TAKE 1 TABLET BY MOUTH EVERY DAY  Current Outpatient Medications (Respiratory):    albuterol (VENTOLIN HFA) 108 (90 Base) MCG/ACT inhaler, TAKE 2 PUFFS BY MOUTH EVERY 6 HOURS AS NEEDED FOR WHEEZE OR SHORTNESS OF BREATH   Albuterol-Budesonide (AIRSUPRA) 90-80 MCG/ACT AERO, Inhale 2 puffs into the lungs every 4 (four)  hours as needed (coughing, wheezing, chest tightness). Do not exceed 12 puffs in 24 hours.   Olopatadine-Mometasone (RYALTRIS) 665-25 MCG/ACT SUSP, Place 1-2 sprays into the nose in the morning and at bedtime.  Current Outpatient Medications (Analgesics):    aspirin EC 81 MG tablet, Take 81 mg by mouth daily. Swallow whole.   ibuprofen (ADVIL) 200 MG tablet, Take 200 mg by mouth daily.   Current Outpatient Medications (Other):    BD INSULIN SYRINGE U/F 31G X 5/16" 1 ML MISC, USE UP TO 3 TIMES A DAY   dexlansoprazole (DEXILANT) 60 MG capsule, TAKE 1 CAPSULE BY MOUTH EVERY DAY   DULoxetine (CYMBALTA) 20 MG capsule, TAKE 1 CAPSULE BY MOUTH EVERY DAY   gabapentin (NEURONTIN) 600 MG tablet, TAKE 1 TABLET IN THE MORNING AND 2 TABLETS AT BEDTIME   glucose blood (ONETOUCH ULTRA) test strip, USE 2 TIMES DAILY. AND LANCETS 2/DAY   ketoconazole (NIZORAL) 2 % cream, APPLY TWICE A DAY FOR 2 WEEKS AS NEEDED FOR RASH   ondansetron (ZOFRAN ODT) 4 MG disintegrating tablet, Take 1 tablet (4 mg total) by mouth every 8 (eight) hours as needed for nausea or vomiting.   polyethylene glycol powder ([employer] PURELAX) 17 GM/SCOOP powder, DISSOLVE 1 CAPFUL IN AT LEAST 8 OUNCES WATER/JUICE AND DRINK TWICE DAILY   sucralfate (CARAFATE) 1 GM/10ML suspension, Take 10 mLs (1 g total) by mouth 4 (four) times daily.   tiZANidine (ZANAFLEX) 2 MG tablet, Take 1 tablet (2 mg total) by mouth at bedtime.   valACYclovir (VALTREX) 500 MG tablet, TAKE 1 TABLET BY MOUTH EVERY DAY   Vitamin D,  Ergocalciferol, (DRISDOL) 1.25 MG (50000 UNIT) CAPS capsule, Take 1 capsule (50,000 Units total) by mouth every 7 (seven) days.   Reviewed prior external information including notes and imaging from  primary care provider As well as notes that were available from care everywhere and other healthcare systems.  Past medical history, social, surgical and family history all reviewed in electronic medical record.  No pertanent information unless stated regarding to the chief complaint.   Review of Systems:  No headache, visual changes, nausea, vomiting, diarrhea, constipation, dizziness, abdominal pain, skin rash, fevers, chills, night sweats, weight loss, swollen lymph nodes, body aches, joint swelling, chest pain, shortness of breath, mood changes. POSITIVE muscle aches  Objective  Blood pressure 126/72, pulse 69, height 5\' 5"  (1.651 m), weight 213 lb (96.6 kg), SpO2 99 %.   General: No apparent distress alert and oriented x3 mood and affect normal, dressed appropriately.  HEENT: Pupils equal, extraocular movements intact  Respiratory: Patient's speak in full sentences and does not appear short of breath  Cardiovascular: No lower extremity edema, non tender, no erythema  Bilateral knee exam shows the patient does have arthritic changes noted.  Trace effusion bilaterally left greater than right.  Some limited range of motion left greater than right. Right ankle trace effusion noted on the lateral aspect of the ankle near the peroneal tendons.    Impression and Recommendations:    The above documentation has been reviewed and is accurate and complete Judi Saa, DO

## 2022-05-04 ENCOUNTER — Ambulatory Visit (INDEPENDENT_AMBULATORY_CARE_PROVIDER_SITE_OTHER): Payer: Medicare Other | Admitting: Family Medicine

## 2022-05-04 VITALS — BP 126/72 | HR 69 | Ht 65.0 in | Wt 213.0 lb

## 2022-05-04 DIAGNOSIS — M170 Bilateral primary osteoarthritis of knee: Secondary | ICD-10-CM | POA: Diagnosis not present

## 2022-05-04 NOTE — Patient Instructions (Signed)
Read about PRP Make appt in 4-5 weeks Let me know if you want to do PRP prior to appt See me in 4 weeks

## 2022-05-04 NOTE — Assessment & Plan Note (Signed)
Chronic problem with worsening symptoms.  We discussed that unfortunately we are to the point where we could either consider ability of injections every 10 weeks but patient has not made significant improvement with that over the course of time.  We also discussed the possibility of PRP and surgical intervention.  Patient would like to consider the PRP and will read on it and see what she thinks.  Patient knows that surgical intervention is not what she would like to do at this time but patient likely has a BMI at 35 so she would be a candidate at this time.  Patient will get back to [place] after she does the reading.  Total time with patient 31 minutes

## 2022-05-11 ENCOUNTER — Encounter: Payer: Self-pay | Admitting: Bariatrics

## 2022-05-11 ENCOUNTER — Ambulatory Visit (INDEPENDENT_AMBULATORY_CARE_PROVIDER_SITE_OTHER): Payer: Medicare Other | Admitting: Bariatrics

## 2022-05-11 VITALS — BP 172/67 | HR 68 | Temp 97.7°F | Ht 65.0 in | Wt 209.0 lb

## 2022-05-11 DIAGNOSIS — E559 Vitamin D deficiency, unspecified: Secondary | ICD-10-CM

## 2022-05-11 DIAGNOSIS — Z6834 Body mass index (BMI) 34.0-34.9, adult: Secondary | ICD-10-CM

## 2022-05-11 DIAGNOSIS — E669 Obesity, unspecified: Secondary | ICD-10-CM | POA: Diagnosis not present

## 2022-05-11 DIAGNOSIS — E1169 Type 2 diabetes mellitus with other specified complication: Secondary | ICD-10-CM | POA: Diagnosis not present

## 2022-05-11 DIAGNOSIS — Z7985 Long-term (current) use of injectable non-insulin antidiabetic drugs: Secondary | ICD-10-CM

## 2022-05-11 MED ORDER — TRULICITY 0.75 MG/0.5ML ~~LOC~~ SOAJ: SUBCUTANEOUS | 0 refills | Status: DC

## 2022-05-22 NOTE — Progress Notes (Signed)
Chief Complaint:   OBESITY Lauralye is here to discuss her progress with her obesity treatment plan along with follow-up of her obesity related diagnoses. Jessica [name] is on the Category 3 Plan and states she is following her eating plan approximately 95% of the time. Jessica [name] states she is not currently exercising.  Today's visit was #: 24 Starting weight: 246 lbs Starting date: 03/12/2019 Today's weight: 209 lbs Today's date: 05/11/2022 Total lbs lost to date: 37 Total lbs lost since last in-office visit: +2  Interim History: Jessica [name] is up 2 lbs since her last visit. She is still struggling with water intake.  Subjective:   1. Vitamin D deficiency Jessica [name] is taking Vitamin D.  2. Type 2 diabetes mellitus with obesity ([place]) Jessica [name] notes decreased nausea and vomiting, and she takes Zofran occasionally.  Assessment/Plan:   1. Vitamin D deficiency Continue prescription Vitamin D.  2. Type 2 diabetes mellitus with obesity ([place]) Continue current treatment plan.  Refill- Dulaglutide (TRULICITY) 0.25 [place]/7.0WC SOPN; Inject into the skin weekly  Dispense: 2 mL; Refill: 0  3. Obesity, Current BMI 34.8 Jessica [name] is currently in the action stage of change. As such, her goal is to continue with weight loss efforts. She has agreed to the Category 3 Plan.   Meal planning. Intentional eating. Increase water intake- use flavor packets. Will do journal.  Exercise goals:  As is  Behavioral modification strategies: increasing lean protein intake, decreasing simple carbohydrates, increasing vegetables, increasing water intake, decreasing eating out, no skipping meals, meal planning and cooking strategies, keeping healthy foods in the home, and planning for success.  Jessica [name] has agreed to follow-up with our clinic in 4 weeks. She was informed of the importance of frequent follow-up visits to maximize her success with intensive lifestyle modifications for her multiple health conditions.   Objective:    Blood pressure (!) 172/67, pulse 68, temperature 97.7 F (36.5 C), height '5\' 5"'$  (1.651 m), weight 209 lb (94.8 kg), SpO2 98 %. Body mass index is 34.78 kg/m.  General: Cooperative, alert, well developed, in no acute distress. HEENT: Conjunctivae and lids unremarkable. Cardiovascular: Regular rhythm.  Lungs: Normal work of breathing. Neurologic: No focal deficits.   Lab Results  Component Value Date   CREATININE 0.99 04/12/2022   BUN 14 04/12/2022   NA 136 04/12/2022   K 4.5 04/12/2022   CL 101 04/12/2022   CO2 30 04/12/2022   Lab Results  Component Value Date   ALT 15 04/12/2022   AST 18 04/12/2022   ALKPHOS 88 04/12/2022   BILITOT 0.5 04/12/2022   Lab Results  Component Value Date   HGBA1C 6.6 (A) 02/08/2022   HGBA1C 6.3 (A) 10/19/2021   HGBA1C 7.7 (A) 04/13/2021   HGBA1C 8.2 (A) 01/12/2021   HGBA1C 8.1 (A) 08/12/2020   No results found for: "INSULIN" Lab Results  Component Value Date   TSH 1.04 04/12/2022   Lab Results  Component Value Date   CHOL 145 04/12/2022   HDL 54.40 04/12/2022   LDLCALC 72 04/12/2022   LDLDIRECT 143.2 11/23/2006   TRIG 93.0 04/12/2022   CHOLHDL 3 04/12/2022   Lab Results  Component Value Date   VD25OH 44.70 04/13/2021   VD25OH 85.0 09/07/2020   VD25OH 61.6 03/11/2020   Lab Results  Component Value Date   WBC 4.6 04/12/2022   HGB 13.6 04/12/2022   HCT 40.6 04/12/2022   MCV 100.0 04/12/2022   PLT 228.0 04/12/2022   Lab Results  Component Value Date  IRON 113 09/07/2020   TIBC 276 09/07/2020   FERRITIN 149 09/07/2020   Attestation Statements:   Reviewed by clinician on day of visit: allergies, medications, problem list, medical history, surgical history, family history, social history, and previous encounter notes.  I, Kathlene November, BS, CMA, am acting as transcriptionist for [employer], DO.  I have reviewed the above documentation for accuracy and completeness, and I agree with the above. [name],  DO

## 2022-05-23 ENCOUNTER — Encounter: Payer: Self-pay | Admitting: Bariatrics

## 2022-05-25 ENCOUNTER — Other Ambulatory Visit: Payer: Self-pay | Admitting: Physician Assistant

## 2022-05-25 ENCOUNTER — Other Ambulatory Visit: Payer: Self-pay

## 2022-05-25 DIAGNOSIS — E1165 Type 2 diabetes mellitus with hyperglycemia: Secondary | ICD-10-CM

## 2022-05-25 MED ORDER — "INSULIN SYRINGE-NEEDLE U-100 31G X 5/16"" 1 ML MISC": 5 refills | Status: AC

## 2022-05-28 ENCOUNTER — Encounter: Payer: Self-pay | Admitting: Family Medicine

## 2022-05-29 ENCOUNTER — Ambulatory Visit (INDEPENDENT_AMBULATORY_CARE_PROVIDER_SITE_OTHER): Payer: Medicare Other | Admitting: Family Medicine

## 2022-05-29 ENCOUNTER — Telehealth: Payer: Self-pay | Admitting: Internal Medicine

## 2022-05-29 ENCOUNTER — Ambulatory Visit (INDEPENDENT_AMBULATORY_CARE_PROVIDER_SITE_OTHER): Payer: Medicare Other

## 2022-05-29 ENCOUNTER — Ambulatory Visit: Payer: Self-pay

## 2022-05-29 VITALS — BP 150/70 | HR 90 | Ht 65.0 in

## 2022-05-29 DIAGNOSIS — M170 Bilateral primary osteoarthritis of knee: Secondary | ICD-10-CM

## 2022-05-29 DIAGNOSIS — M25561 Pain in right knee: Secondary | ICD-10-CM | POA: Diagnosis not present

## 2022-05-29 MED ORDER — POLYETHYLENE GLYCOL 3350 17 GM/SCOOP PO POWD: ORAL | 5 refills | Status: DC

## 2022-05-29 NOTE — Assessment & Plan Note (Signed)
Patient did have aspiration done of the right knee today.  Held on any steroid injection secondary to may be considering the possibility of surgical and icing.  Patient did not have what appeared to be a subluxation versus dislocation of the kneecap.  X-rays are still pending for her daily position.  Patient was able to ambulate better after the aspiration today.  I do think though that patient should consider surgical intervention and will refer patient to orthopedic surgery to discuss.  We discussed the possibility of needing a knee immobilizer and using it day and night for the next 2 weeks.  Patient does have a to calf ratio which does make it difficult and I am concerned that patient could be a fall risk so we will continue to use the cane for now but will discuss at follow-up in the next 1 to 2 weeks as well.

## 2022-05-29 NOTE — Patient Instructions (Addendum)
Xray today Drained today Dr. Ninfa Linden referral  Brace: You have 14 days to return or exchange your brace Call [phone], then return the brace to our office

## 2022-05-29 NOTE — Progress Notes (Signed)
[place] [address] [place] Phone: [phone] Subjective:    I'm seeing this patient by the request  of:  [name], MD  CC: Right knee pain  [id number]  05/04/2022 Chronic problem with worsening symptoms. We discussed that unfortunately we are to the point where we could either consider ability of injections every 10 weeks but patient has not made significant improvement with that over the course of time. We also discussed the possibility of PRP and surgical intervention. Patient would like to consider the PRP and will read on it and see what she thinks. Patient knows that surgical intervention is not what she would like to do at this time but patient likely has a BMI at 35 so she would be a candidate at this time. Patient will get back to [place] after she does the reading. Total time with patient 31 minutes   Updated 05/29/2022 Jessica [name] is a 72 y.o. female coming in with complaint of right knee pain. Got up one day and felt a pop when walking. Has been popping and giving way since. So painful that she is using walker.       Past Medical History:  Diagnosis Date   Anxiety    Arthritis    bilateral knees   Asthma    childhood   Autonomic neuropathy    diabetic   Cataract    Celiac disease    Constipation    Depression    Diabetes mellitus    type II, Hemoglobin A1C 9.9 10/05/2011   Diabetic neuropathy ([place])    Diverticulosis    Fatty liver    Gastroparesis    GERD (gastroesophageal reflux disease)    Hyperlipidemia    Hypertension    IBS (irritable bowel syndrome)    Kidney stones    Personal history of colonic polyps 03/2010   hyperplastic   Shingles 2021   Past Surgical History:  Procedure Laterality Date   APPENDECTOMY     CATARACT EXTRACTION Right 04/29/2018   CATARACT EXTRACTION Left 03/2018   COLONOSCOPY     DILATATION & CURRETTAGE/HYSTEROSCOPY WITH RESECTOCOPE N/A 03/24/2013   Procedure:  DILATATION & CURETTAGE, HYSTEROSCOPY WITH RESECTION;  Surgeon: Olga Millers, MD;  Location: [facility];  Service: Gynecology;  Laterality: N/A;   HYSTEROSCOPY WITH D & C N/A 11/08/2015   Procedure: DILATATION AND CURETTAGE /HYSTEROSCOPY;  Surgeon: Olga Millers, MD;  Location: [facility];  Service: Gynecology;  Laterality: N/A;   left breast cyst removal  2000   LEFT HEART CATH AND CORONARY ANGIOGRAPHY N/A 10/31/2018   Procedure: LEFT HEART CATH AND CORONARY ANGIOGRAPHY;  Surgeon: Jettie Booze, MD;  Location: [facility];  Service: Cardiovascular;  Laterality: N/A;   TUBAL LIGATION     Social History   Socioeconomic History   Marital status: Married    Spouse name: Devar Durell   Number of children: 2   Years of education: Not on file   Highest education level: Not on file  Occupational History   Occupation: Scientist, 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   Occupation: retired  Tobacco Use   Smoking status: Never   Smokeless tobacco: Never  Scientific laboratory technician Use: Never used  Substance and Sexual Activity   Alcohol use: No   Drug use: No   Sexual activity: Yes    Birth control/protection: Post-menopausal, Surgical  Other Topics Concern  Not on file  Social History Narrative   Not on file   Social Determinants of Health   Financial Resource Strain: Low Risk  (03/13/2022)   Overall Financial Resource Strain (CARDIA)    Difficulty of Paying Living Expenses: Not hard at all  Food Insecurity: No Food Insecurity (03/13/2022)   Hunger Vital Sign    Worried About Running Out of Food in the Last Year: Never true    Ran Out of Food in the Last Year: Never true  Transportation Needs: No Transportation Needs (03/13/2022)   PRAPARE - Hydrologist (Medical): No    Lack of Transportation (Non-Medical): No  Physical Activity: Inactive (03/13/2022)   Exercise Vital Sign    Days of Exercise per Week: 0 days     Minutes of Exercise per Session: 0 min  Stress: Stress Concern Present (03/13/2022)   [place]    Feeling of Stress : To some extent  Social Connections: Moderately Integrated (03/13/2022)   Social Connection and Isolation Panel [NHANES]    Frequency of Communication with Friends and Family: More than three times a week    Frequency of Social Gatherings with Friends and Family: Once a week    Attends Religious Services: More than 4 times per year    Active Member of [employer] or Organizations: No    Attends Music therapist: Never    Marital Status: Married   Allergies  Allergen Reactions   Codeine     Other reaction(s): vomiting   Gluten Meal Other (See Comments)    No wheat or soy   Metronidazole     Other reaction(s): vomiting   Nsaids Nausea And Vomiting and Other (See Comments)    Cannot tolerate large doses  Other reaction(s): vomiting Other reaction(s): vomiting   Soy Allergy    Wheat Bran    Family History  Problem Relation Age of Onset   Hypertension Mother    Colon cancer Sister        dx in her early 34s   Diabetes Sister    Endometrial cancer Sister    Hypertension Brother    Other Father        2 collapsed lungs   COPD Father    Diabetes Father    Heart attack Father    Heart failure Father    High blood pressure Father    Colon polyps Neg Hx    Esophageal cancer Neg Hx    Stomach cancer Neg Hx     Current Outpatient Medications (Endocrine & Metabolic):    Dulaglutide (TRULICITY) 6.94 WN/4.6EV SOPN, Inject into the skin weekly   insulin NPH-regular Human (HUMULIN 70/30) (70-30) 100 UNIT/ML injection, INJECT 32 UNITS WITH BREAKFAST, AND 14 UNITS WITH SUPPER.  Current Outpatient Medications (Cardiovascular):    atorvastatin (LIPITOR) 40 MG tablet, TAKE 1 TABLET BY MOUTH EVERY DAY   furosemide (LASIX) 20 MG tablet, TAKE 1 TABLET (20 MG TOTAL) BY MOUTH DAILY AS NEEDED FOR FLUID  OR EDEMA.   olmesartan (BENICAR) 40 MG tablet, TAKE 1 TABLET BY MOUTH EVERY DAY  Current Outpatient Medications (Respiratory):    albuterol (VENTOLIN HFA) 108 (90 Base) MCG/ACT inhaler, TAKE 2 PUFFS BY MOUTH EVERY 6 HOURS AS NEEDED FOR WHEEZE OR SHORTNESS OF BREATH   Albuterol-Budesonide (AIRSUPRA) 90-80 MCG/ACT AERO, Inhale 2 puffs into the lungs every 4 (four) hours as needed (coughing, wheezing, chest tightness). Do not exceed 12 puffs in  24 hours.   Olopatadine-Mometasone (RYALTRIS) 665-25 MCG/ACT SUSP, Place 1-2 sprays into the nose in the morning and at bedtime.  Current Outpatient Medications (Analgesics):    aspirin EC 81 MG tablet, Take 81 mg by mouth daily. Swallow whole.   ibuprofen (ADVIL) 200 MG tablet, Take 200 mg by mouth daily.   Current Outpatient Medications (Other):    dexlansoprazole (DEXILANT) 60 MG capsule, TAKE 1 CAPSULE BY MOUTH EVERY DAY   DULoxetine (CYMBALTA) 20 MG capsule, TAKE 1 CAPSULE BY MOUTH EVERY DAY   gabapentin (NEURONTIN) 600 MG tablet, TAKE 1 TABLET IN THE MORNING AND 2 TABLETS AT BEDTIME   glucose blood (ONETOUCH ULTRA) test strip, USE 2 TIMES DAILY. AND LANCETS 2/DAY   Insulin Syringe-Needle U-100 (BD INSULIN SYRINGE U/F) 31G X 5/16" 1 ML MISC, USE UP TO 3 TIMES A DAY   ketoconazole (NIZORAL) 2 % cream, APPLY TWICE A DAY FOR 2 WEEKS AS NEEDED FOR RASH   ondansetron (ZOFRAN ODT) 4 MG disintegrating tablet, Take 1 tablet (4 mg total) by mouth every 8 (eight) hours as needed for nausea or vomiting.   polyethylene glycol powder ([facility] PURELAX) 17 GM/SCOOP powder, DISSOLVE 1 CAPFUL IN AT LEAST 8 OUNCES WATER/JUICE AND DRINK TWICE DAILY   sucralfate (CARAFATE) 1 GM/10ML suspension, Take 10 mLs (1 g total) by mouth 4 (four) times daily.   tiZANidine (ZANAFLEX) 2 MG tablet, Take 1 tablet (2 mg total) by mouth at bedtime.   valACYclovir (VALTREX) 500 MG tablet, TAKE 1 TABLET BY MOUTH EVERY DAY   Vitamin D, Ergocalciferol, (DRISDOL) 1.25 MG (50000 UNIT) CAPS  capsule, Take 1 capsule (50,000 Units total) by mouth every 7 (seven) days.   Reviewed prior external information including notes and imaging from  primary care provider As well as notes that were available from care everywhere and other healthcare systems.  Past medical history, social, surgical and family history all reviewed in electronic medical record.  No pertanent information unless stated regarding to the chief complaint.   Review of Systems:  No headache, visual changes, nausea, vomiting, diarrhea, constipation, dizziness, abdominal pain, skin rash, fevers, chills, night sweats, weight loss, swollen lymph nodes, body aches, joint swelling, chest pain, shortness of breath, mood changes. POSITIVE muscle aches  Objective  Blood pressure (!) 150/70, pulse 90, height '5\' 5"'$  (1.651 m), SpO2 97 %.   General: No apparent distress alert and oriented x3 mood and affect normal, dressed appropriately.  Severely antalgic gait favoring the right leg significantly. Right knee does have significant effusion noted.  Does have some dislocation and feels like of the kneecap.  After some manipulation audible popping heard and patient is able to go to repositioning.  Patient still has significant tightness and effusion noted.  Procedure: Real-time Ultrasound Guided Injection of right knee Device: GE Logiq Q7 Ultrasound guided injection is preferred based studies that show increased duration, increased effect, greater accuracy, decreased procedural pain, increased response rate, and decreased cost with ultrasound guided versus blind injection.  Verbal informed consent obtained.  Time-out conducted.  Noted no overlying erythema, induration, or other signs of local infection.  Skin prepped in a sterile fashion.  Local anesthesia: Topical Ethyl chloride.  With sterile technique and under real time ultrasound guidance: With a 22-gauge 2 inch needle patient was injected with 4 cc of 0.5% Marcaine and  aspirated 60 cc systolic, fluid . This was from a superior lateral approach.  Completed without difficulty  Pain immediately resolved suggesting accurate placement of the medication.  Advised to call if fevers/chills, erythema, induration, drainage, or persistent bleeding.  Impression: Technically successful ultrasound guided injection.    Impression and Recommendations:    The above documentation has been reviewed and is accurate and complete Lyndal Pulley, DO

## 2022-05-29 NOTE — Telephone Encounter (Signed)
Refill sent to pharmacy for Purelax

## 2022-05-29 NOTE — Telephone Encounter (Signed)
Inbound call from patient stating she needs a refill for [facility] Pure[facility]. Please advise.

## 2022-05-30 NOTE — Progress Notes (Unsigned)
[place] [address] [place] Phone: [phone] Subjective:   Jessica [name], am serving as a scribe for Dr. Hulan Saas.  I'm seeing this patient by the request  of:  [name], MD  CC: Right knee pain follow-up  [id number]  05/29/2022 Patient did have aspiration done of the right knee today. Held on any steroid injection secondary to may be considering the possibility of surgical and icing. Patient did not have what appeared to be a subluxation versus dislocation of the kneecap. X-rays are still pending for her daily position. Patient was able to ambulate better after the aspiration today. I do think though that patient should consider surgical intervention and will refer patient to orthopedic surgery to discuss. We discussed the possibility of needing a knee immobilizer and using it day and night for the next 2 weeks. Patient does have a to calf ratio which does make it difficult and I am concerned that patient could be a fall risk so we will continue to use the cane for now but will discuss at follow-up in the next 1 to 2 weeks as well   Updated 06/01/2022 Jessica [name] is a 72 y.o. female coming in with complaint of knee pain. Doing much better since we drained her knee earlier this week. Showed her how to adjust her knee brace for a better fitting. Has an appointment with ortho in late February. Just looking to go over xrays and have some pain management until she sees the ortho surgeon.       Past Medical History:  Diagnosis Date   Anxiety    Arthritis    bilateral knees   Asthma    childhood   Autonomic neuropathy    diabetic   Cataract    Celiac disease    Constipation    Depression    Diabetes mellitus    type II, Hemoglobin A1C 9.9 10/05/2011   Diabetic neuropathy ([place])    Diverticulosis    Fatty liver    Gastroparesis    GERD (gastroesophageal reflux disease)    Hyperlipidemia    Hypertension    IBS  (irritable bowel syndrome)    Kidney stones    Personal history of colonic polyps 03/2010   hyperplastic   Shingles 2021   Past Surgical History:  Procedure Laterality Date   APPENDECTOMY     CATARACT EXTRACTION Right 04/29/2018   CATARACT EXTRACTION Left 03/2018   COLONOSCOPY     DILATATION & CURRETTAGE/HYSTEROSCOPY WITH RESECTOCOPE N/A 03/24/2013   Procedure: DILATATION & CURETTAGE, HYSTEROSCOPY WITH RESECTION;  Surgeon: Olga Millers, MD;  Location: [facility];  Service: Gynecology;  Laterality: N/A;   HYSTEROSCOPY WITH D & C N/A 11/08/2015   Procedure: DILATATION AND CURETTAGE /HYSTEROSCOPY;  Surgeon: Olga Millers, MD;  Location: [facility];  Service: Gynecology;  Laterality: N/A;   left breast cyst removal  2000   LEFT HEART CATH AND CORONARY ANGIOGRAPHY N/A 10/31/2018   Procedure: LEFT HEART CATH AND CORONARY ANGIOGRAPHY;  Surgeon: Jettie Booze, MD;  Location: [facility];  Service: Cardiovascular;  Laterality: N/A;   TUBAL LIGATION     Social History   Socioeconomic History   Marital status: Married    Spouse name: Devar Lucchesi   Number of children: 2   Years of education: Not on file   Highest education level: Not on file  Occupational History   Occupation: Scientist, 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   Occupation: retired  Tobacco Use   Smoking status: Never   Smokeless tobacco: Never  Vaping Use   Vaping Use: Never used  Substance and Sexual Activity   Alcohol use: No   Drug use: No   Sexual activity: Yes    Birth control/protection: Post-menopausal, Surgical  Other Topics Concern   Not on file  Social History Narrative   Not on file   Social Determinants of Health   Financial Resource Strain: Low Risk  (03/13/2022)   Overall Financial Resource Strain (CARDIA)    Difficulty of Paying Living Expenses: Not hard at all  Food Insecurity: No Food Insecurity (03/13/2022)   Hunger Vital Sign    Worried About Running  Out of Food in the Last Year: Never true    Ran Out of Food in the Last Year: Never true  Transportation Needs: No Transportation Needs (03/13/2022)   PRAPARE - Hydrologist (Medical): No    Lack of Transportation (Non-Medical): No  Physical Activity: Inactive (03/13/2022)   Exercise Vital Sign    Days of Exercise per Week: 0 days    Minutes of Exercise per Session: 0 min  Stress: Stress Concern Present (03/13/2022)   [place]    Feeling of Stress : To some extent  Social Connections: Moderately Integrated (03/13/2022)   Social Connection and Isolation Panel [NHANES]    Frequency of Communication with Friends and Family: More than three times a week    Frequency of Social Gatherings with Friends and Family: Once a week    Attends Religious Services: More than 4 times per year    Active Member of [employer] or Organizations: No    Attends Music therapist: Never    Marital Status: Married   Allergies  Allergen Reactions   Codeine     Other reaction(s): vomiting   Gluten Meal Other (See Comments)    No wheat or soy   Metronidazole     Other reaction(s): vomiting   Nsaids Nausea And Vomiting and Other (See Comments)    Cannot tolerate large doses  Other reaction(s): vomiting Other reaction(s): vomiting   Soy Allergy    Wheat Bran    Family History  Problem Relation Age of Onset   Hypertension Mother    Colon cancer Sister        dx in her early 75s   Diabetes Sister    Endometrial cancer Sister    Hypertension Brother    Other Father        2 collapsed lungs   COPD Father    Diabetes Father    Heart attack Father    Heart failure Father    High blood pressure Father    Colon polyps Neg Hx    Esophageal cancer Neg Hx    Stomach cancer Neg Hx     Current Outpatient Medications (Endocrine & Metabolic):    Dulaglutide (TRULICITY) 3.24 MW/1.0UV SOPN, Inject into  the skin weekly   insulin NPH-regular Human (HUMULIN 70/30) (70-30) 100 UNIT/ML injection, INJECT 32 UNITS WITH BREAKFAST, AND 14 UNITS WITH SUPPER.  Current Outpatient Medications (Cardiovascular):    atorvastatin (LIPITOR) 40 MG tablet, TAKE 1 TABLET BY MOUTH EVERY DAY   furosemide (LASIX) 20 MG tablet, TAKE 1 TABLET (20 MG TOTAL) BY MOUTH DAILY AS NEEDED FOR FLUID OR EDEMA.   olmesartan (BENICAR) 40 MG tablet, TAKE 1  TABLET BY MOUTH EVERY DAY  Current Outpatient Medications (Respiratory):    albuterol (VENTOLIN HFA) 108 (90 Base) MCG/ACT inhaler, TAKE 2 PUFFS BY MOUTH EVERY 6 HOURS AS NEEDED FOR WHEEZE OR SHORTNESS OF BREATH   Albuterol-Budesonide (AIRSUPRA) 90-80 MCG/ACT AERO, Inhale 2 puffs into the lungs every 4 (four) hours as needed (coughing, wheezing, chest tightness). Do not exceed 12 puffs in 24 hours.   Olopatadine-Mometasone (RYALTRIS) 665-25 MCG/ACT SUSP, Place 1-2 sprays into the nose in the morning and at bedtime.  Current Outpatient Medications (Analgesics):    aspirin EC 81 MG tablet, Take 81 mg by mouth daily. Swallow whole.   ibuprofen (ADVIL) 200 MG tablet, Take 200 mg by mouth daily.   Current Outpatient Medications (Other):    dexlansoprazole (DEXILANT) 60 MG capsule, TAKE 1 CAPSULE BY MOUTH EVERY DAY   DULoxetine (CYMBALTA) 20 MG capsule, TAKE 1 CAPSULE BY MOUTH EVERY DAY   gabapentin (NEURONTIN) 600 MG tablet, TAKE 1 TABLET IN THE MORNING AND 2 TABLETS AT BEDTIME   glucose blood (ONETOUCH ULTRA) test strip, USE 2 TIMES DAILY. AND LANCETS 2/DAY   Insulin Syringe-Needle U-100 (BD INSULIN SYRINGE U/F) 31G X 5/16" 1 ML MISC, USE UP TO 3 TIMES A DAY   ketoconazole (NIZORAL) 2 % cream, APPLY TWICE A DAY FOR 2 WEEKS AS NEEDED FOR RASH   ondansetron (ZOFRAN ODT) 4 MG disintegrating tablet, Take 1 tablet (4 mg total) by mouth every 8 (eight) hours as needed for nausea or vomiting.   polyethylene glycol powder ([facility] PURELAX) 17 GM/SCOOP powder, DISSOLVE 1 CAPFUL IN AT LEAST 8  OUNCES WATER/JUICE AND DRINK TWICE DAILY   sucralfate (CARAFATE) 1 GM/10ML suspension, Take 10 mLs (1 g total) by mouth 4 (four) times daily.   tiZANidine (ZANAFLEX) 2 MG tablet, Take 1 tablet (2 mg total) by mouth at bedtime.   valACYclovir (VALTREX) 500 MG tablet, TAKE 1 TABLET BY MOUTH EVERY DAY   Vitamin D, Ergocalciferol, (DRISDOL) 1.25 MG (50000 UNIT) CAPS capsule, Take 1 capsule (50,000 Units total) by mouth every 7 (seven) days.   Reviewed prior external information including notes and imaging from  primary care provider As well as notes that were available from care everywhere and other healthcare systems.  Past medical history, social, surgical and family history all reviewed in electronic medical record.  No pertanent information unless stated regarding to the chief complaint.   Review of Systems:  No headache, visual changes, nausea, vomiting, diarrhea, constipation, dizziness, abdominal pain, skin rash, fevers, chills, night sweats, weight loss, swollen lymph nodes, body aches, joint swelling, chest pain, shortness of breath, mood changes. POSITIVE muscle aches  Objective  Blood pressure (!) 148/82, pulse 70, height '5\' 5"'$  (1.651 m), weight 213 lb (96.6 kg), SpO2 98 %.   General: No apparent distress alert and oriented x3 mood and affect normal, dressed appropriately.  HEENT: Pupils equal, extraocular movements intact  Respiratory: Patient's speak in full sentences and does not appear short of breath  Still has an antalgic gait but significantly improved from previous exam.  No improvement in range of motion noted.  Still slight effusion noted of the knee but significant improvement noted with now 95 to 105 degrees of flexion.  Still has tenderness over the patella itself.  Right location     Impression and Recommendations:     The above documentation has been reviewed and is accurate and complete Lyndal Pulley, DO

## 2022-06-01 ENCOUNTER — Encounter: Payer: Self-pay | Admitting: Family Medicine

## 2022-06-01 ENCOUNTER — Ambulatory Visit (INDEPENDENT_AMBULATORY_CARE_PROVIDER_SITE_OTHER): Payer: Medicare Other | Admitting: Family Medicine

## 2022-06-01 VITALS — BP 148/82 | HR 70 | Ht 65.0 in | Wt 213.0 lb

## 2022-06-01 DIAGNOSIS — M170 Bilateral primary osteoarthritis of knee: Secondary | ICD-10-CM | POA: Diagnosis not present

## 2022-06-01 NOTE — Patient Instructions (Signed)
Tell the family house works is on them Keep wearing brace and use cane out of house Keep calling ortho to see about cancellations Keep [place] updated on your plan

## 2022-06-01 NOTE — Assessment & Plan Note (Signed)
After aspiration patient is doing much better.  Patient has been sent to orthopedic surgery to discuss potential replacement.  Patient is doing better but encouraged her to continue to wear the hinged brace with any walking of any significance for now.  Could give other home exercises but for now on range of motion if any worsening pain to seek medical attention otherwise patient to follow-up with me as needed.

## 2022-06-05 ENCOUNTER — Telehealth: Payer: Self-pay

## 2022-06-05 NOTE — Telephone Encounter (Signed)
Patient called stating she has been seeing doctor smith for her knee. States she is wearing her brace and has not driven since it is on her right knee. Patient wanted to know if it is okay for her to drive. Would like a call back to discuss.

## 2022-06-06 NOTE — Telephone Encounter (Signed)
Sent patient MyChart message.

## 2022-06-08 ENCOUNTER — Ambulatory Visit (INDEPENDENT_AMBULATORY_CARE_PROVIDER_SITE_OTHER): Payer: Medicare Other | Admitting: Bariatrics

## 2022-06-08 ENCOUNTER — Encounter: Payer: Self-pay | Admitting: Bariatrics

## 2022-06-08 VITALS — BP 117/52 | HR 74 | Temp 98.0°F | Ht 65.0 in | Wt 204.0 lb

## 2022-06-08 DIAGNOSIS — E1169 Type 2 diabetes mellitus with other specified complication: Secondary | ICD-10-CM | POA: Diagnosis not present

## 2022-06-08 DIAGNOSIS — Z6834 Body mass index (BMI) 34.0-34.9, adult: Secondary | ICD-10-CM

## 2022-06-08 DIAGNOSIS — E559 Vitamin D deficiency, unspecified: Secondary | ICD-10-CM | POA: Diagnosis not present

## 2022-06-08 DIAGNOSIS — Z7985 Long-term (current) use of injectable non-insulin antidiabetic drugs: Secondary | ICD-10-CM

## 2022-06-08 DIAGNOSIS — E669 Obesity, unspecified: Secondary | ICD-10-CM

## 2022-06-08 MED ORDER — VITAMIN D (ERGOCALCIFEROL) 1.25 MG (50000 UNIT) PO CAPS: 50000.0000 [IU] | ORAL_CAPSULE | ORAL | 0 refills | Status: DC

## 2022-06-08 MED ORDER — TRULICITY 0.75 MG/0.5ML ~~LOC~~ SOAJ: SUBCUTANEOUS | 0 refills | Status: DC

## 2022-06-14 ENCOUNTER — Other Ambulatory Visit: Payer: Self-pay | Admitting: Internal Medicine

## 2022-06-14 ENCOUNTER — Ambulatory Visit (INDEPENDENT_AMBULATORY_CARE_PROVIDER_SITE_OTHER): Payer: Medicare Other | Admitting: Internal Medicine

## 2022-06-14 ENCOUNTER — Encounter: Payer: Self-pay | Admitting: Internal Medicine

## 2022-06-14 VITALS — BP 122/84 | HR 70 | Ht 65.0 in | Wt 211.6 lb

## 2022-06-14 DIAGNOSIS — E1142 Type 2 diabetes mellitus with diabetic polyneuropathy: Secondary | ICD-10-CM | POA: Diagnosis not present

## 2022-06-14 DIAGNOSIS — E1165 Type 2 diabetes mellitus with hyperglycemia: Secondary | ICD-10-CM | POA: Diagnosis not present

## 2022-06-14 DIAGNOSIS — E1169 Type 2 diabetes mellitus with other specified complication: Secondary | ICD-10-CM | POA: Diagnosis not present

## 2022-06-14 DIAGNOSIS — E782 Mixed hyperlipidemia: Secondary | ICD-10-CM

## 2022-06-14 DIAGNOSIS — E042 Nontoxic multinodular goiter: Secondary | ICD-10-CM | POA: Diagnosis not present

## 2022-06-14 LAB — POCT GLYCOSYLATED HEMOGLOBIN (HGB A1C): Hemoglobin A1C: 7.2 % — AB (ref 4.0–5.6)

## 2022-06-14 MED ORDER — FIASP FLEXTOUCH 100 UNIT/ML ~~LOC~~ SOPN: 5.0000 [IU] | PEN_INJECTOR | Freq: Two times a day (BID) | SUBCUTANEOUS | 3 refills | Status: DC

## 2022-06-14 MED ORDER — LANTUS SOLOSTAR 100 UNIT/ML ~~LOC~~ SOPN: 30.0000 [IU] | PEN_INJECTOR | Freq: Every day | SUBCUTANEOUS | 3 refills | Status: DC

## 2022-06-14 MED ORDER — INSULIN PEN NEEDLE 32G X 4 MM MISC: 3 refills | Status: DC

## 2022-06-14 NOTE — Progress Notes (Signed)
Patient ID: Jessica [name], female   DOB: Apr 21, 1951, 72 y.o.   MRN: [id number]  HPI: Jessica [name] is a 72 y.o.-year-old female, returning for follow-up for DM2, dx in 2000, insulin-dependent since 2005, uncontrolled, with complications (DR, PN, gastroparesis, autonomic neuropathy). Pt. previously saw Dr. Loanne Drilling, but last visit with me 01/2022.  Interim history: No increased urination, blurry vision, nausea, chest pain. She hurt her R knee 3 weeks ago >> sugars have been higher ever since.  DM2: Reviewed HbA1c: Lab Results  Component Value Date   HGBA1C 6.6 (A) 02/08/2022   HGBA1C 6.3 (A) 10/19/2021   HGBA1C 7.7 (A) 04/13/2021   HGBA1C 8.2 (A) 01/12/2021   HGBA1C 8.1 (A) 08/12/2020   HGBA1C 7.8 (A) 05/14/2020   HGBA1C 7.1 (A) 12/31/2019   HGBA1C 6.9 (A) 10/28/2019   HGBA1C 6.7 (H) 07/24/2019   HGBA1C 7.2 (A) 02/20/2019   Pt is on a regimen of: - Humulin 70/30 40 >> 32 units after b'fast  >> before b'fastand 8 >> 10 >> 14 units before dinner  - Trulicity A999333 mg weekly - adjusted by Weight Loss Specialist (on nausea med)- could not tolerate a higher dose She had nausea and stomach pain with Ozempic.  Pt checks her sugars 0-1x a day and they are: - am: 120-180 >> 150-160 - 2h after b'fast: n/c - before lunch: n/c - 2h after lunch: n/c >> 320 - before dinner: 100-125 >> 110-120 - 2h after dinner: n/c >> <130 - bedtime: n/c >> 64 x1 - nighttime: n/c Lowest sugar was 77 >> 64; she has hypoglycemia awareness at 100.  Highest sugar was 180 >> 320.  Glucometer: One Touch ultra  She goes to the weight management clinic.  - no CKD, last BUN/creatinine:  Lab Results  Component Value Date   BUN 14 04/12/2022   BUN 23 06/09/2021   CREATININE 0.99 04/12/2022   CREATININE 0.95 06/09/2021  She is on olmesartan 40 mg daily.  -+ HL; last set of lipids: Lab Results  Component Value Date   CHOL 145 04/12/2022   HDL 54.40 04/12/2022   LDLCALC 72 04/12/2022   LDLDIRECT 143.2  11/23/2006   TRIG 93.0 04/12/2022   CHOLHDL 3 04/12/2022  She is on Lipitor 40 mg daily.  - last eye exam was  05/2022: + DR reportedly. + DR in 2021.   - + numbness and tingling in her feet.  On Neurontin 600 mg in a.m. and 1200 mg at night. Last foot exam 09/2021.  MNG:  Thyroid U/S (11/18/2019): Parenchymal Echotexture: Mildly heterogenous  Isthmus: 0.5 cm  Right lobe: 7.0 cm x 1.7 cm x 1.7 cm  Left lobe: 4.5 cm x 1.3 cm x 1.5 cm _________________________________________________________   Estimated total number of nodules >/= 1 cm: 3 _________________________________________________________   Nodule # 1:  Location: Isthmus; lower  Maximum size: 1.2 cm; Other 2 dimensions: 1.1 cm x 1.1 cm  Composition: cannot determine (2)  Echogenicity: isoechoic (1) Nodule does not meet criteria for surveillance or biopsy   _________________________________________________________   Nodule # 2:  Location: Right; Mid  Maximum size: 1.3 cm; Other 2 dimensions: 1.1 cm x 0.8 cm  Composition: cannot determine (2)  Echogenicity: isoechoic (1) Nodule does not meet criteria for surveillance or biopsy  _________________________________________________________   Nodule # 3:   Location: Right; Inferior  Maximum size: 1.9 cm; Other 2 dimensions: 1.6 cm x 1.2 cm  Composition: cannot determine (2)  Echogenicity: isoechoic (1) Nodule meets criteria for  surveillance _________________________________________________________   No adenopathy   IMPRESSION: Multinodular thyroid.   Right inferior thyroid nodule (labeled 3, TR 3, 1.9 cm) meets criteria for surveillance, as designated by the newly established ACR TI-RADS criteria. Surveillance ultrasound study recommended to be performed annually up to 5 years.   She remembers having had a thyroid Bx by Dr. Loanne Drilling >> reportedly benign.  Thyroid U/S (02/17/2022): Parenchymal Echotexture: Mildly heterogenous  Isthmus: 1.3 cm  Right lobe: 5.2  cm x 1.4 cm x 1.7 cm  Left lobe: 4.9 cm x 1.1 cm x 1.5 cm  _________________________________________________________   Estimated total number of nodules >/= 1 cm: 2 _________________________________________________________   Nodule labeled 1 in the isthmus, 1.2 cm, unchanged in size. Nodule remains TR 3 and meets criteria for surveillance.   Nodule labeled 2 right lower thyroid, slightly smaller now measuring 1.1 cm. Nodule has spongiform characteristics on the current ultrasound and does not meet criteria for further surveillance.   No adenopathy   IMPRESSION: Isthmic thyroid nodule (labeled 1, 1.2 cm, TR 3) meets criteria for surveillance, as designated by the newly established ACR TI-RADS criteria. Surveillance ultrasound study recommended to be performed annually up to 5 years, which would be July 2026.  TSH levels have been normal: Lab Results  Component Value Date   TSH 1.04 04/12/2022   TSH 0.91 04/13/2021   TSH 1.320 09/07/2020   TSH 2.15 10/16/2018   TSH 1.27 10/25/2017   TSH 1.46 10/02/2016   TSH 1.55 11/03/2015   TSH 0.74 05/21/2013   TSH 1.00 11/13/2012   TSH 1.680 04/04/2012   TSH 1.43 08/10/2010   TSH 1.10 04/01/2008   TSH 1.24 11/23/2006   Pt denies: - feeling nodules in neck - hoarseness - dysphagia - choking  ROS: + see HPI No increased urination, blurry vision, nausea, chest pain.  Past Medical History:  Diagnosis Date   Anxiety    Arthritis    bilateral knees   Asthma    childhood   Autonomic neuropathy    diabetic   Cataract    Celiac disease    Constipation    Depression    Diabetes mellitus    type II, Hemoglobin A1C 9.9 10/05/2011   Diabetic neuropathy ([place])    Diverticulosis    Fatty liver    Gastroparesis    GERD (gastroesophageal reflux disease)    Hyperlipidemia    Hypertension    IBS (irritable bowel syndrome)    Kidney stones    Personal history of colonic polyps 03/2010   hyperplastic   Shingles 2021   Past  Surgical History:  Procedure Laterality Date   APPENDECTOMY     CATARACT EXTRACTION Right 04/29/2018   CATARACT EXTRACTION Left 03/2018   COLONOSCOPY     DILATATION & CURRETTAGE/HYSTEROSCOPY WITH RESECTOCOPE N/A 03/24/2013   Procedure: DILATATION & CURETTAGE, HYSTEROSCOPY WITH RESECTION;  Surgeon: Olga Millers, MD;  Location: [facility];  Service: Gynecology;  Laterality: N/A;   HYSTEROSCOPY WITH D & C N/A 11/08/2015   Procedure: DILATATION AND CURETTAGE /HYSTEROSCOPY;  Surgeon: Olga Millers, MD;  Location: [facility];  Service: Gynecology;  Laterality: N/A;   left breast cyst removal  2000   LEFT HEART CATH AND CORONARY ANGIOGRAPHY N/A 10/31/2018   Procedure: LEFT HEART CATH AND CORONARY ANGIOGRAPHY;  Surgeon: Jettie Booze, MD;  Location: [facility];  Service: Cardiovascular;  Laterality: N/A;   TUBAL LIGATION     Social History   Socioeconomic History   Marital  status: Married    Spouse name: Devar Bellizzi   Number of children: 2   Years of education: Not on file   Highest education level: Not on file  Occupational History   Occupation: Scientist, 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   Occupation: retired  Tobacco Use   Smoking status: Never   Smokeless tobacco: Never  Vaping Use   Vaping Use: Never used  Substance and Sexual Activity   Alcohol use: No   Drug use: No   Sexual activity: Yes    Birth control/protection: Post-menopausal, Surgical  Other Topics Concern   Not on file  Social History Narrative   Not on file   Social Determinants of Health   Financial Resource Strain: [place]  (03/13/2022)   Overall Financial Resource Strain (CARDIA)    Difficulty of Paying Living Expenses: Not hard at all  Food Insecurity: No Food Insecurity (03/13/2022)   Hunger Vital Sign    Worried About Running Out of Food in the Last Year: Never true    [place] in the Last Year: Never true  Transportation Needs: No Transportation  Needs (03/13/2022)   PRAPARE - Hydrologist (Medical): No    Lack of Transportation (Non-Medical): No  Physical Activity: Inactive (03/13/2022)   Exercise Vital Sign    Days of Exercise per Week: 0 days    Minutes of Exercise per Session: 0 min  Stress: Stress Concern Present (03/13/2022)   [place]    Feeling of Stress : To some extent  Social Connections: Moderately Integrated (03/13/2022)   Social Connection and Isolation Panel [NHANES]    Frequency of Communication with Friends and Family: More than three times a week    Frequency of Social Gatherings with Friends and Family: Once a week    Attends Religious Services: More than 4 times per year    Active Member of [employer] or Organizations: No    Attends Archivist Meetings: Never    Marital Status: Married  Human resources officer Violence: Not At Risk (03/13/2022)   Humiliation, Afraid, Rape, and Kick questionnaire    Fear of Current or Ex-Partner: No    Emotionally Abused: No    Physically Abused: No    Sexually Abused: No   Current Outpatient Medications on File Prior to Visit  Medication Sig Dispense Refill   albuterol (VENTOLIN HFA) 108 (90 Base) MCG/ACT inhaler TAKE 2 PUFFS BY MOUTH EVERY 6 HOURS AS NEEDED FOR WHEEZE OR SHORTNESS OF BREATH 6.7 each 3   Albuterol-Budesonide (AIRSUPRA) 90-80 MCG/ACT AERO Inhale 2 puffs into the lungs every 4 (four) hours as needed (coughing, wheezing, chest tightness). Do not exceed 12 puffs in 24 hours. 10.7 g 2   aspirin EC 81 MG tablet Take 81 mg by mouth daily. Swallow whole.     atorvastatin (LIPITOR) 40 MG tablet TAKE 1 TABLET BY MOUTH EVERY DAY 90 tablet 3   dexlansoprazole (DEXILANT) 60 MG capsule TAKE 1 CAPSULE BY MOUTH EVERY DAY 90 capsule 3   Dulaglutide (TRULICITY) A999333 0000000 SOPN Inject into the skin weekly 2 mL 0   DULoxetine (CYMBALTA) 20 MG capsule TAKE 1 CAPSULE BY MOUTH EVERY  DAY 90 capsule 1   furosemide (LASIX) 20 MG tablet TAKE 1 TABLET (20 MG TOTAL) BY MOUTH DAILY AS NEEDED FOR FLUID OR EDEMA. 90 tablet 3   gabapentin (NEURONTIN) 600 MG tablet  TAKE 1 TABLET IN THE MORNING AND 2 TABLETS AT BEDTIME 270 tablet 2   glucose blood (ONETOUCH ULTRA) test strip USE 2 TIMES DAILY. AND LANCETS 2/DAY 200 strip 3   ibuprofen (ADVIL) 200 MG tablet Take 200 mg by mouth daily.     insulin NPH-regular Human (HUMULIN 70/30) (70-30) 100 UNIT/ML injection INJECT 32 UNITS WITH BREAKFAST, AND 14 UNITS WITH SUPPER. 60 mL 3   Insulin Syringe-Needle U-100 (BD INSULIN SYRINGE U/F) 31G X 5/16" 1 ML MISC USE UP TO 3 TIMES A DAY 100 each 5   ketoconazole (NIZORAL) 2 % cream APPLY TWICE A DAY FOR 2 WEEKS AS NEEDED FOR RASH 30 g 2   olmesartan (BENICAR) 40 MG tablet TAKE 1 TABLET BY MOUTH EVERY DAY 90 tablet 3   Olopatadine-Mometasone (RYALTRIS) 665-25 MCG/ACT SUSP Place 1-2 sprays into the nose in the morning and at bedtime. 29 g 5   ondansetron (ZOFRAN ODT) 4 MG disintegrating tablet Take 1 tablet (4 mg total) by mouth every 8 (eight) hours as needed for nausea or vomiting. 30 tablet 0   polyethylene glycol powder ([employer] PURELAX) 17 GM/SCOOP powder DISSOLVE 1 CAPFUL IN AT LEAST 8 OUNCES WATER/JUICE AND DRINK TWICE DAILY 1020 g 5   sucralfate (CARAFATE) 1 GM/10ML suspension Take 10 mLs (1 g total) by mouth 4 (four) times daily. 420 mL 1   tiZANidine (ZANAFLEX) 2 MG tablet Take 1 tablet (2 mg total) by mouth at bedtime. 30 tablet 0   valACYclovir (VALTREX) 500 MG tablet TAKE 1 TABLET BY MOUTH EVERY DAY 90 tablet 3   Vitamin D, Ergocalciferol, (DRISDOL) 1.25 MG (50000 UNIT) CAPS capsule Take 1 capsule (50,000 Units total) by mouth every 30 (thirty) days. 3 capsule 0   No current facility-administered medications on file prior to visit.   Allergies  Allergen Reactions   Codeine     Other reaction(s): vomiting   Gluten Meal Other (See Comments)    No wheat or soy   Metronidazole     Other  reaction(s): vomiting   Nsaids Nausea And Vomiting and Other (See Comments)    Cannot tolerate large doses  Other reaction(s): vomiting Other reaction(s): vomiting   Soy Allergy    Wheat Bran    Family History  Problem Relation Age of Onset   Hypertension Mother    Colon cancer Sister        dx in her early 57s   Diabetes Sister    Endometrial cancer Sister    Hypertension Brother    Other Father        2 collapsed lungs   COPD Father    Diabetes Father    Heart attack Father    Heart failure Father    High blood pressure Father    Colon polyps Neg Hx    Esophageal cancer Neg Hx    Stomach cancer Neg Hx    PE: BP 122/84 (BP Location: Right Arm, Patient Position: Sitting, Cuff Size: Normal)   Pulse 70   Ht 5' 5"$  (1.651 m)   Wt 211 lb 9.6 oz (96 kg)   SpO2 99%   BMI 35.21 kg/m  Wt Readings from Last 3 Encounters:  06/14/22 211 lb 9.6 oz (96 kg)  06/08/22 204 lb (92.5 kg)  06/01/22 213 lb (96.6 kg)   Constitutional: overweight, in NAD, walks with a cane Eyes: no exophthalmos ENT: no thyromegaly, no cervical lymphadenopathy Cardiovascular: RRR, No MRG Respiratory: CTA B Musculoskeletal: no deformities Skin:  no rashes  Neurological: no tremor with outstretched hands  ASSESSMENT: 1. DM2, insulin-dependent, uncontrolled, with complications - gastroparesis - PN - Autonomic neuropathy - DR  2. HL  3.  Multinodular goiter  PLAN:  1. Patient with longstanding, uncontrolled, type 2 diabetes, on injectable antidiabetic regimen with premixed insulin due to cost, and also weekly GLP-1 receptor agonist, with improved control in the last year.  At that time, HbA1c was 6.6%, slightly higher than before, when he was 6.3%.  Sugars were above target in the 20s and she was not checking later in the day.  We discussed about the importance of checking sugars throughout the day, rotating check times.  We also discussed about how to take the premixed insulin correctly, 30 minutes  before meals.  Her Trulicity dose is adjusted by Dr. Owens Shark in the weight management clinic.  She could not tolerate the higher dose of Trulicity possibly due to celiac disease versus gastroparesis. -At today's visit, sugars are higher than target in the morning and they are better controlled around dinnertime but she is not taking the middle of the day except for 1 value which was in the 300s.  At this point, I recommended to switch from the premixed insulin regimen to a basal/bolus insulin regimen.  She agrees with this.  I am not sure which insulins are covered for her, but I did advise her to try to start Lantus and Fiasp.  We discussed about the differences between these 2 insulins and the 70/30 premixed regimen.  I advised her to take the Fiasp at the start of each meal.  She has a small lunch (basically just a snack) so for now I advised her to do not take Fiasp for this.  However, if the sugars increase before dinner, we may need to introduce a lower dose of Fiasp before lunch, 2. - I suggested to:  Patient Instructions  Please stop: - Humulin 70/30   Change to: - Lantus 30 units  at night - Fiasp  7-9 units before b'fast 5-7 units before dinner  Continue: - Trulicity A999333 mg weekly   Please return in 4 months with your sugar log.   - we checked her HbA1c: 7.2% (higher) - advised to check sugars at different times of the day - 2x a day, rotating check times - advised for yearly eye exams >> she is UTD - return to clinic in 4 months  2. HL -Latest lipid panel from 03/2022: LDL slightly above target, the rest the fractions at goal: Lab Results  Component Value Date   CHOL 145 04/12/2022   HDL 54.40 04/12/2022   LDLCALC 72 04/12/2022   LDLDIRECT 143.2 11/23/2006   TRIG 93.0 04/12/2022   CHOLHDL 3 04/12/2022  -She continues on Lipitor 40 mg daily without side effects  3. MNG -No neck compression symptoms and no masses felt on palpation of her neck today -Latest TSH was  normal: Lab Results  Component Value Date   TSH 1.04 04/12/2022  -Latest thyroid ultrasound was from 2021.  The nodules were unchanged.  The right lower pole nodule qualified for the need for follow-up based on size.  She had a thyroid biopsy by Dr. Loanne Drilling in the past that returned benign reportedly.  I was not able to find records on this. -At last visit, we checked a thyroid ultrasound (01/2022): The isthmus nodule was unchanged by the right lower nodule is smaller. -Will need to follow the isthmus nodule with another ultrasound in 1 year from the  previous, but no intervention needed for now   Philemon Kingdom, MD PhD [facility]

## 2022-06-14 NOTE — Patient Instructions (Addendum)
Please stop: - Humulin 70/30   Change to: - Lantus 30 units  at night - Fiasp  7-9 units before b'fast 5-7 units before dinner  Continue: - Trulicity A999333 mg weekly   Please return in 4 months with your sugar log.

## 2022-06-19 ENCOUNTER — Other Ambulatory Visit: Payer: Self-pay | Admitting: Family Medicine

## 2022-06-20 ENCOUNTER — Ambulatory Visit (INDEPENDENT_AMBULATORY_CARE_PROVIDER_SITE_OTHER): Payer: Medicare Other | Admitting: Allergy

## 2022-06-20 ENCOUNTER — Encounter: Payer: Self-pay | Admitting: Allergy

## 2022-06-20 VITALS — BP 112/56 | HR 72 | Temp 98.0°F | Resp 16 | Wt 211.2 lb

## 2022-06-20 DIAGNOSIS — J4530 Mild persistent asthma, uncomplicated: Secondary | ICD-10-CM | POA: Diagnosis not present

## 2022-06-20 DIAGNOSIS — K219 Gastro-esophageal reflux disease without esophagitis: Secondary | ICD-10-CM | POA: Diagnosis not present

## 2022-06-20 DIAGNOSIS — J310 Chronic rhinitis: Secondary | ICD-10-CM

## 2022-06-20 DIAGNOSIS — K900 Celiac disease: Secondary | ICD-10-CM

## 2022-06-20 NOTE — Assessment & Plan Note (Signed)
Past history - Diagnosed with asthma many years ago and recently has been needing to use albuterol about 3 times per week for wheezing. 2023 spirometry shows some restriction with no improvement in FEV1 but there was a 23% improvement in the small airways post bronchodilator treatment.  Clinically feeling unchanged. Interim history - Airsupra not covered. Only uses albuterol prn. May use albuterol rescue inhaler 2 puffs every 4 to 6 hours as needed for shortness of breath, chest tightness, coughing, and wheezing. Monitor frequency of use.

## 2022-06-20 NOTE — Patient Instructions (Addendum)
Chronic non-allergic rhinitis Use Flonase (fluticasone) nasal spray 1 spray per nostril twice a day as needed for nasal congestion.  Nasal saline spray (i.e., Simply Saline) or nasal saline lavage (i.e., NeilMed) is recommended as needed and prior to medicated nasal sprays. Keep ENT visit on March 1st.  If ENT does not see any structural issues then get bloodwork next.  Breathing May use albuterol rescue inhaler 2 puffs every 4 to 6 hours as needed for shortness of breath, chest tightness, coughing, and wheezing. Monitor frequency of use.  Breathing control goals:  Full participation in all desired activities (may need albuterol before activity) Albuterol use two times or less a week on average (not counting use with activity) Cough interfering with sleep two times or less a month Oral steroids no more than once a year No hospitalizations    Heartburn: Continue lifestyle and dietary modifications. Continue Dexilant 73m once a day. Nothing to eat or drink for 30 minutes afterwards. If not doing better, follow up with GI.  Food Continue to avoid gluten and soy.  Follow up in 2 months or sooner if needed.

## 2022-06-20 NOTE — Assessment & Plan Note (Signed)
Continue to avoid gluten and soy (apparently this also causes issues for her). 

## 2022-06-20 NOTE — Assessment & Plan Note (Signed)
Past history - Perennial rhinitis symptoms which have flared the last 4 months.  Had 2 courses of antibiotics and prednisone with no benefit.  2017 skin testing was positive to multiple molds, grass, weed, trees and cat.  No prior AIT.  No prior ENT evaluation.  Zyrtec, Claritin and Flonase ineffective. 2023 skin testing showed: Negative to indoor/outdoor allergens and common foods.  Interim history - had some delayed irritation/itching after intradermal testing. Ryaltris too expensive. Symptoms unchanged. Discussed with patient at length that you can have symptoms and not have any environmental allergies. We still treat the symptoms whether a test shows positive allergies or not.  Use Flonase (fluticasone) nasal spray 1 spray per nostril twice a day as needed for nasal congestion.  Nasal saline spray (i.e., Simply Saline) or nasal saline lavage (i.e., NeilMed) is recommended as needed and prior to medicated nasal sprays. Keep ENT visit on March 1st.  If ENT visit is unremarkable patient wants to get bloodwork for environmental allergy panel - lab order given.

## 2022-06-20 NOTE — Progress Notes (Signed)
Follow Up Note  RE: Jessica [name] MRN: [id number] DOB: 19-Dec-1950 Date of Office Visit: 06/20/2022  Referring provider: [name], MD Primary care provider: [name], MD  Chief Complaint: Follow-up (Is still doing about the same. Cannot get into the ENT until March. Disagrees that she feels like she does have allergies. Meds that were last sent in were too expensive for her. Feels like her tonsils are swollen and can taste some foods but some she cannot. )  History of Present Illness: I had the pleasure of seeing Jacqulyn Purk for a follow up visit at the [facility] on 06/20/2022. She is a 72 y.o. female, who is being followed for chronic rhinitis, asthma, GERD, and celiac disease. Her previous allergy office visit was on 04/18/2022 with Dr. Maudie Mercury. Today is a regular follow up visit.  Chronic rhinitis Patient is scheduled to see ENT on 3/1. Tried Ryaltris for about 1-2 week with no benefit. The nasal sprays were too expensive.  Currently on Flonase 1 spray per nostril as needed. No nosebleeds. Still having some dried nasal passages.  Patient states that after her last visit, she had delayed itching/erythema on her arm where the intradermal testing was done the following day.   She still experiences headaches and increased sinus symptoms when she goes outside.  Mild persistent asthma Denies any SOB, coughing, wheezing, chest tightness, nocturnal awakenings, ER/urgent care visits or prednisone use since the last visit.  Colin Ina was not covered and using albuterol as needed with good benefit.   Gastroesophageal reflux disease Still taking Dexilant 80m once a day.  Sometimes feels like her tonsils are enlarged.   Assessment and Plan: SYarethziis a 72y.o. female with: Chronic rhinitis Past history - Perennial rhinitis symptoms which have flared the last 4 months.  Had 2 courses of antibiotics and prednisone with no benefit.  2017 skin testing was  positive to multiple molds, grass, weed, trees and cat.  No prior AIT.  No prior ENT evaluation.  Zyrtec, Claritin and Flonase ineffective. 2023 skin testing showed: Negative to indoor/outdoor allergens and common foods.  Interim history - had some delayed irritation/itching after intradermal testing. Ryaltris too expensive. Symptoms unchanged. Discussed with patient at length that you can have symptoms and not have any environmental allergies. We still treat the symptoms whether a test shows positive allergies or not.  Use Flonase (fluticasone) nasal spray 1 spray per nostril twice a day as needed for nasal congestion.  Nasal saline spray (i.e., Simply Saline) or nasal saline lavage (i.e., NeilMed) is recommended as needed and prior to medicated nasal sprays. Keep ENT visit on March 1st.  If ENT visit is unremarkable patient wants to get bloodwork for environmental allergy panel - lab order given.   Mild persistent asthma without complication Past history - Diagnosed with asthma many years ago and recently has been needing to use albuterol about 3 times per week for wheezing. 2023 spirometry shows some restriction with no improvement in FEV1 but there was a 23% improvement in the small airways post bronchodilator treatment.  Clinically feeling unchanged. Interim history - Airsupra not covered. Only uses albuterol prn. May use albuterol rescue inhaler 2 puffs every 4 to 6 hours as needed for shortness of breath, chest tightness, coughing, and wheezing. Monitor frequency of use.   Gastroesophageal reflux disease Past history - Patient is not sure if PND is making reflux worse but symptoms more aggravated these days. Interim history - still  feels like throat is irritated with tonsils enlarged. Normal throat exam today.  Continue lifestyle and dietary modifications. Continue Dexilant 60m once a day. Nothing to eat or drink for 30 minutes afterwards. If not doing better, follow up with GI.  Celiac  disease Continue to avoid gluten and soy (apparently this also causes issues for her).  Return in about 2 months (around 08/19/2022).  No orders of the defined types were placed in this encounter.  Lab Orders         Allergens w/Total IgE Area 2      Diagnostics: None.   Medication List:  Current Outpatient Medications  Medication Sig Dispense Refill   albuterol (VENTOLIN HFA) 108 (90 Base) MCG/ACT inhaler TAKE 2 PUFFS BY MOUTH EVERY 6 HOURS AS NEEDED FOR WHEEZE OR SHORTNESS OF BREATH 6.7 each 3   aspirin EC 81 MG tablet Take 81 mg by mouth daily. Swallow whole.     atorvastatin (LIPITOR) 40 MG tablet TAKE 1 TABLET BY MOUTH EVERY DAY 90 tablet 3   dexlansoprazole (DEXILANT) 60 MG capsule TAKE 1 CAPSULE BY MOUTH EVERY DAY 90 capsule 3   Dulaglutide (TRULICITY) [id number] Inject into the skin weekly 2 mL 0   DULoxetine (CYMBALTA) 20 MG capsule TAKE 1 CAPSULE BY MOUTH EVERY DAY 90 capsule 1   furosemide (LASIX) 20 MG tablet TAKE 1 TABLET (20 MG TOTAL) BY MOUTH DAILY AS NEEDED FOR FLUID OR EDEMA. 90 tablet 3   gabapentin (NEURONTIN) 600 MG tablet TAKE 1 TABLET IN THE MORNING AND 2 TABLETS AT BEDTIME 270 tablet 2   glucose blood (ONETOUCH ULTRA) test strip USE 2 TIMES DAILY. AND LANCETS 2/DAY 200 strip 3   ibuprofen (ADVIL) 200 MG tablet Take 200 mg by mouth daily.     insulin NPH-regular Human (HUMULIN 70/30) (70-30) 100 UNIT/ML injection INJECT 32 UNITS WITH BREAKFAST, AND 14 UNITS WITH SUPPER. 60 mL 3   Insulin Pen Needle 32G X 4 MM MISC Use 3-4x a day 300 each 3   Insulin Syringe-Needle U-100 (BD INSULIN SYRINGE U/F) 31G X 5/16" 1 ML MISC USE UP TO 3 TIMES A DAY 100 each 5   ketoconazole (NIZORAL) 2 % cream APPLY TWICE A DAY FOR 2 WEEKS AS NEEDED FOR RASH 30 g 2   olmesartan (BENICAR) 40 MG tablet TAKE 1 TABLET BY MOUTH EVERY DAY 90 tablet 3   ondansetron (ZOFRAN ODT) 4 MG disintegrating tablet Take 1 tablet (4 mg total) by mouth every 8 (eight) hours as needed for nausea or  vomiting. 30 tablet 0   polyethylene glycol powder ([employer] PURELAX) 17 GM/SCOOP powder DISSOLVE 1 CAPFUL IN AT LEAST 8 OUNCES WATER/JUICE AND DRINK TWICE DAILY 1020 g 5   sucralfate (CARAFATE) 1 GM/10ML suspension Take 10 mLs (1 g total) by mouth 4 (four) times daily. 420 mL 1   tiZANidine (ZANAFLEX) 2 MG tablet Take 1 tablet (2 mg total) by mouth at bedtime. 30 tablet 0   valACYclovir (VALTREX) 500 MG tablet TAKE 1 TABLET BY MOUTH EVERY DAY 90 tablet 3   Vitamin D, Ergocalciferol, (DRISDOL) 1.25 MG (50000 UNIT) CAPS capsule Take 1 capsule (50,000 Units total) by mouth every 30 (thirty) days. 3 capsule 0   insulin glargine (LANTUS SOLOSTAR) 100 UNIT/ML Solostar Pen Inject 30 Units into the skin at bedtime. (Patient not taking: Reported on 06/20/2022) 15 mL 3   insulin lispro (HUMALOG KWIKPEN) 100 UNIT/ML KwikPen Inject 5-9 Units into the skin 2 (two) times daily before a  meal. (Patient not taking: Reported on 06/20/2022) 15 mL 1   No current facility-administered medications for this visit.   Allergies: Allergies  Allergen Reactions   Codeine     Other reaction(s): vomiting   Gluten Meal Other (See Comments)    No wheat or soy   Metronidazole     Other reaction(s): vomiting   Nsaids Nausea And Vomiting and Other (See Comments)    Cannot tolerate large doses  Other reaction(s): vomiting Other reaction(s): vomiting   Soy Allergy    Wheat Bran    I reviewed her past medical history, social history, family history, and environmental history and no significant changes have been reported from her previous visit.  Review of Systems  Constitutional:  Negative for appetite change, chills, fever and unexpected weight change.  HENT:  Positive for congestion, postnasal drip, rhinorrhea, sinus pressure and sore throat.   Eyes:  Negative for itching.  Respiratory:  Negative for cough, chest tightness, shortness of breath and wheezing.   Cardiovascular:  Negative for chest pain.  Gastrointestinal:   Negative for abdominal pain.  Genitourinary:  Negative for difficulty urinating.  Skin:  Negative for rash.  Allergic/Immunologic: Negative for environmental allergies and food allergies.  Neurological:  Positive for headaches.    Objective: BP (!) 112/56   Pulse 72   Temp 98 F (36.7 C)   Resp 16   Wt 211 lb 4 oz (95.8 kg)   SpO2 92%   BMI 35.15 kg/m  Body mass index is 35.15 kg/m. Physical Exam Vitals and nursing note reviewed.  Constitutional:      Appearance: Normal appearance. She is well-developed. She is obese.  HENT:     Head: Normocephalic and atraumatic.     Right Ear: Tympanic membrane and external ear normal.     Left Ear: Tympanic membrane and external ear normal.     Nose: Nose normal.     Mouth/Throat:     Mouth: Mucous membranes are moist.     Pharynx: Oropharynx is clear.  Eyes:     Conjunctiva/sclera: Conjunctivae normal.  Cardiovascular:     Rate and Rhythm: Normal rate and regular rhythm.     Heart sounds: Normal heart sounds. No murmur heard.    No friction rub. No gallop.  Pulmonary:     Effort: Pulmonary effort is normal.     Breath sounds: Normal breath sounds. No wheezing, rhonchi or rales.  Musculoskeletal:     Cervical back: Neck supple.  Skin:    General: Skin is warm.     Findings: No rash.  Neurological:     Mental Status: She is alert and oriented to person, place, and time.  Psychiatric:        Behavior: Behavior normal.    Previous notes and tests were reviewed. The plan was reviewed with the patient/family, and all questions/concerned were addressed.  It was my pleasure to see Latay today and participate in her care. Please feel free to contact me with any questions or concerns.  Sincerely,  Rexene Alberts, DO Allergy & Immunology  [facility] office: [place] office: [phone]

## 2022-06-20 NOTE — Assessment & Plan Note (Signed)
Past history - Patient is not sure if PND is making reflux worse but symptoms more aggravated these days. Interim history - still feels like throat is irritated with tonsils enlarged. Normal throat exam today.  Continue lifestyle and dietary modifications. Continue Dexilant 68m once a day. Nothing to eat or drink for 30 minutes afterwards. If not doing better, follow up with GI.

## 2022-06-21 ENCOUNTER — Ambulatory Visit (INDEPENDENT_AMBULATORY_CARE_PROVIDER_SITE_OTHER): Payer: Medicare Other | Admitting: Orthopaedic Surgery

## 2022-06-21 DIAGNOSIS — M1711 Unilateral primary osteoarthritis, right knee: Secondary | ICD-10-CM | POA: Diagnosis not present

## 2022-06-21 DIAGNOSIS — M25561 Pain in right knee: Secondary | ICD-10-CM | POA: Diagnosis not present

## 2022-06-21 DIAGNOSIS — G8929 Other chronic pain: Secondary | ICD-10-CM

## 2022-06-21 NOTE — Progress Notes (Signed)
The patient is a very pleasant 72 year old female sent from Dr. 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  She does ambulate with a cane and does wear a knee brace on that right knee because she is not confident that her knee will support her.  I did see x-rays of her knee showing severe tricompartment arthritis.  She actually even had a very large joint effusion.  She has had aspirations of that knee as well as steroid injections and hyaluronic acid injections.  At this point her right knee pain is detrimentally affecting her mobility, her quality of life and her actives daily living.  She is a diabetic.  Her last hemoglobin A1c 7 days ago was 7.2.  She has no issues with her left knee.  I was able to review all of her notes within epic as well as medications and past medical history.  She has been dealing with this for well over a year and has tried and failed all forms of conservative treatment including quad strengthening exercises and therapy, as well as injections and rest.  Examination of her right knee shows again a moderate effusion of the right knee.  There is global tenderness throughout the arc of motion of the knee with significant medial lateral joint line tenderness and patellofemoral crepitation.  Her extensor mechanism is intact.  X-rays of the right knee show end-stage tricompartment arthritis with osteophytes in all 3 compartments and severe joint space narrowing.  It is basically bone-on-bone of the lateral compartment and patellofemoral joint.  This point we did discuss knee replacement surgery.  We talked about the risks and benefits of the surgery and what to expect from an intraoperative and postoperative course.  I showed her knee replacement model.  We talked about surgery in detail.  She does wish to have this scheduled and I agree based on her clinical exam findings combined with her x-ray findings and the failure conservative treatment.

## 2022-06-22 NOTE — Progress Notes (Signed)
Chief Complaint:   OBESITY Jessica [name] is here to discuss her progress with her obesity treatment plan along with follow-up of her obesity related diagnoses. Jessica [name] is on the Category 3 plan and states she is following her eating plan approximately 96% of the time. Jessica [name] states she has not been exercising.  Today's visit was #: 39 Starting weight: 246 lbs Starting date: 03/12/19 Today's weight: 204 lbs Today's date: 06/08/22 Total lbs lost to date: 42 Total lbs lost since last in-office visit: -5  Interim History: She is down an additional 5 pounds since her last visit.  She did increase her water and watching portion size.  Subjective:   1. Type 2 diabetes mellitus with obesity (Jessica [name]) Taking Trulicity as directed.  Still has some nausea.  2. Vitamin D deficiency Taking vitamin D as directed.  Assessment/Plan:   1. Type 2 diabetes mellitus with obesity (Jessica [name]) 1.  Refill: - Dulaglutide (TRULICITY) [id number] 0000000 SOPN; Inject into the skin weekly  Dispense: 2 mL; Refill: 0 2.  Will see endocrinology next week.  2. Vitamin D deficiency 1. Refill: - Vitamin D, Ergocalciferol, (DRISDOL) 1.25 MG (50000 UNIT) CAPS capsule; Take 1 capsule (50,000 Units total) by mouth every 30 (thirty) days.  Dispense: 3 capsule; Refill: 0 2. Schedule appointment so that we can do vitamin D.   3. Morbid obesity, BMI 34.0-34.9,adult 1.  Will continue to adhere closely to the plan. 2.  Intentional eating.  Jessica [name] is currently in the action stage of change. As such, her goal is to continue with weight loss efforts. She has agreed to the Category 3 plan.  Exercise goals: Exercise decreased.  Right knee brace and has to see the orthopedist.  (Using a cane).  Behavioral modification strategies: increasing lean protein intake, decreasing simple carbohydrates, increasing vegetables, increasing water intake, decreasing eating out, no skipping meals, meal planning and cooking strategies, keeping healthy  foods in the home, and planning for success.  Jessica [name] has agreed to follow-up with our clinic in 4 weeks. She was informed of the importance of frequent follow-up visits to maximize her success with intensive lifestyle modifications for her multiple health conditions.   Objective:   Blood pressure (!) 117/52, pulse 74, temperature 98 F (36.7 C), height '5\' 5"'$  (1.651 m), weight 204 lb (92.5 kg), SpO2 98 %. Body mass index is 33.95 kg/m.  General: Cooperative, alert, well developed, in no acute distress. HEENT: Conjunctivae and lids unremarkable. Cardiovascular: Regular rhythm.  Lungs: Normal work of breathing. Neurologic: No focal deficits.   Lab Results  Component Value Date   CREATININE 0.99 04/12/2022   BUN 14 04/12/2022   NA 136 04/12/2022   K 4.5 04/12/2022   CL 101 04/12/2022   CO2 30 04/12/2022   Lab Results  Component Value Date   ALT 15 04/12/2022   AST 18 04/12/2022   ALKPHOS 88 04/12/2022   BILITOT 0.5 04/12/2022   Lab Results  Component Value Date   HGBA1C 7.2 (A) 06/14/2022   HGBA1C 6.6 (A) 02/08/2022   HGBA1C 6.3 (A) 10/19/2021   HGBA1C 7.7 (A) 04/13/2021   HGBA1C 8.2 (A) 01/12/2021   No results found for: "INSULIN" Lab Results  Component Value Date   TSH 1.04 04/12/2022   Lab Results  Component Value Date   CHOL 145 04/12/2022   HDL 54.40 04/12/2022   LDLCALC 72 04/12/2022   LDLDIRECT 143.2 11/23/2006   TRIG 93.0 04/12/2022   CHOLHDL 3 04/12/2022   Lab Results  Component  Value Date   VD25OH 44.70 04/13/2021   VD25OH 85.0 09/07/2020   VD25OH 61.6 03/11/2020   Lab Results  Component Value Date   WBC 4.6 04/12/2022   HGB 13.6 04/12/2022   HCT 40.6 04/12/2022   MCV 100.0 04/12/2022   PLT 228.0 04/12/2022   Lab Results  Component Value Date   IRON 113 09/07/2020   TIBC 276 09/07/2020   FERRITIN 149 09/07/2020    Attestation Statements:   Reviewed by clinician on day of visit: allergies, medications, problem list, medical history,  surgical history, family history, social history, and previous encounter notes.  I, Dawn Whitmire, FNP-C, am acting as transcriptionist for Dr. [name].  I have reviewed the above documentation for accuracy and completeness, and I agree with the above. [name], DO

## 2022-06-23 ENCOUNTER — Telehealth: Payer: Self-pay | Admitting: Family Medicine

## 2022-06-23 NOTE — Telephone Encounter (Signed)
Patient called to push out her appointment with Dr Tamala Julian. She wanted to let him know that she did see Dr Ninfa Linden and he is waiting to proceed with the knee replacement. She is currently waiting on them to contact her to schedule.Just FYI.

## 2022-06-27 ENCOUNTER — Encounter: Payer: Self-pay | Admitting: Bariatrics

## 2022-06-28 ENCOUNTER — Ambulatory Visit: Payer: Medicare Other | Admitting: Family Medicine

## 2022-07-06 ENCOUNTER — Encounter: Payer: Self-pay | Admitting: Bariatrics

## 2022-07-06 ENCOUNTER — Ambulatory Visit (INDEPENDENT_AMBULATORY_CARE_PROVIDER_SITE_OTHER): Payer: Medicare Other | Admitting: Bariatrics

## 2022-07-06 VITALS — BP 149/76 | HR 72 | Temp 97.6°F | Ht 65.0 in | Wt 208.0 lb

## 2022-07-06 DIAGNOSIS — E1169 Type 2 diabetes mellitus with other specified complication: Secondary | ICD-10-CM

## 2022-07-06 DIAGNOSIS — E559 Vitamin D deficiency, unspecified: Secondary | ICD-10-CM | POA: Diagnosis not present

## 2022-07-06 DIAGNOSIS — I10 Essential (primary) hypertension: Secondary | ICD-10-CM

## 2022-07-06 DIAGNOSIS — Z7985 Long-term (current) use of injectable non-insulin antidiabetic drugs: Secondary | ICD-10-CM

## 2022-07-06 DIAGNOSIS — Z6834 Body mass index (BMI) 34.0-34.9, adult: Secondary | ICD-10-CM

## 2022-07-06 MED ORDER — TRULICITY 0.75 MG/0.5ML ~~LOC~~ SOAJ: SUBCUTANEOUS | 0 refills | Status: DC

## 2022-07-07 LAB — VITAMIN D 25 HYDROXY (VIT D DEFICIENCY, FRACTURES): Vit D, 25-Hydroxy: 67.2 ng/mL (ref 30.0–100.0)

## 2022-07-10 ENCOUNTER — Other Ambulatory Visit: Payer: Self-pay | Admitting: Internal Medicine

## 2022-07-11 LAB — ALLERGENS W/TOTAL IGE AREA 2

## 2022-07-17 ENCOUNTER — Telehealth: Payer: Medicare Other

## 2022-07-18 NOTE — Progress Notes (Signed)
Chief Complaint:   OBESITY Jessica [name] is here to discuss her progress with her obesity treatment plan along with follow-up of her obesity related diagnoses. Jessica [name] is on the Category 3 plan and states she is following her eating plan approximately 95% of the time. Jessica [name] states she has not been exercising.   Today's visit was #: 57 Starting weight: 246 lbs Starting date: 03/12/19 Today's weight: 208 lbs Today's date: 07/06/22 Total lbs lost to date: 38 Total lbs lost since last in-office visit: +4  Interim History: She is up about 4 pounds.  Her water weight is up about 5 pounds per bioimpedance.  She has been under more stress. She needs to have a knee replacement.  She is scheduled for knee surgery 09/12/2022.  Subjective:   1. Vitamin D deficiency Taking vitamin D.  2. Essential hypertension Stable.  3. Type 2 diabetes mellitus with obesity (Jessica [name]) Taking Trulicity.  Assessment/Plan:   1. Vitamin D deficiency Continue vitamin D.  2. Essential hypertension Continue medications.  3. Type 2 diabetes mellitus with obesity (Jessica [name]) 1.  Will stop the Trulicity 10 days to 2 weeks before surgery. 2.  Refill- - Dulaglutide (TRULICITY) [id number] 0000000 SOPN; Inject into the skin weekly  Dispense: 2 mL; Refill: 0  4. Morbid obesity (Jessica [name]) BMI 34.0-34.9,adult 1.  Meal planning 2.  Intentional eating.  Jessica [name] is currently in the action stage of change. As such, her goal is to continue with weight loss efforts. She has agreed to the Category 3 plan.  Exercise goals: All adults should avoid inactivity. Some physical activity is better than none, and adults who participate in any amount of physical activity gain some health benefits.  Behavioral modification strategies: increasing lean protein intake, decreasing simple carbohydrates, increasing vegetables, increasing water intake, decreasing eating out, no skipping meals, meal planning and cooking strategies, keeping healthy foods in the  home, and planning for success.  Jessica [name] has agreed to follow-up with our clinic in 4 weeks. She was informed of the importance of frequent follow-up visits to maximize her success with intensive lifestyle modifications for her multiple health conditions.   Objective:   Blood pressure (!) 149/76, pulse 72, temperature 97.6 F (36.4 C), height 5\' 5"  (1.651 m), weight 208 lb (94.3 kg), SpO2 98 %. Body mass index is 34.61 kg/m.  General: Cooperative, alert, well developed, in no acute distress. HEENT: Conjunctivae and lids unremarkable. Cardiovascular: Regular rhythm.  Lungs: Normal work of breathing. Neurologic: No focal deficits.   Lab Results  Component Value Date   CREATININE 0.99 04/12/2022   BUN 14 04/12/2022   NA 136 04/12/2022   K 4.5 04/12/2022   CL 101 04/12/2022   CO2 30 04/12/2022   Lab Results  Component Value Date   ALT 15 04/12/2022   AST 18 04/12/2022   ALKPHOS 88 04/12/2022   BILITOT 0.5 04/12/2022   Lab Results  Component Value Date   HGBA1C 7.2 (A) 06/14/2022   HGBA1C 6.6 (A) 02/08/2022   HGBA1C 6.3 (A) 10/19/2021   HGBA1C 7.7 (A) 04/13/2021   HGBA1C 8.2 (A) 01/12/2021   No results found for: "INSULIN" Lab Results  Component Value Date   TSH 1.04 04/12/2022   Lab Results  Component Value Date   CHOL 145 04/12/2022   HDL 54.40 04/12/2022   LDLCALC 72 04/12/2022   LDLDIRECT 143.2 11/23/2006   TRIG 93.0 04/12/2022   CHOLHDL 3 04/12/2022   Lab Results  Component Value Date   VD25OH 67.2 07/06/2022  VD25OH 44.70 04/13/2021   VD25OH 85.0 09/07/2020   Lab Results  Component Value Date   WBC 4.6 04/12/2022   HGB 13.6 04/12/2022   HCT 40.6 04/12/2022   MCV 100.0 04/12/2022   PLT 228.0 04/12/2022   Lab Results  Component Value Date   IRON 113 09/07/2020   TIBC 276 09/07/2020   FERRITIN 149 09/07/2020    Attestation Statements:   Reviewed by clinician on day of visit: allergies, medications, problem list, medical history, surgical  history, family history, social history, and previous encounter notes.  I, Dawn Whitmire, FNP-C, am acting as transcriptionist for Dr. [name].  I have reviewed the above documentation for accuracy and completeness, and I agree with the above. [name], DO

## 2022-08-02 NOTE — Progress Notes (Unsigned)
[name] [address] [place] [address] Phone: [phone] Subjective:   Jessica [name], am serving as a scribe for Dr. Antoine [name].  I'm seeing this patient by the request  of:  Jessica Ora, MD  CC: Left knee pain  WGN:[id number]  06/01/2022 After aspiration patient is doing much better. Patient has been sent to orthopedic surgery to discuss potential replacement. Patient is doing better but encouraged her to continue to wear the hinged brace with any walking of any significance for now. Could give other home exercises but for now on range of motion if any worsening pain to seek medical attention otherwise patient to follow-up with me as needed   Updated 08/07/2022 Jessica [name] is a 72 y.o. female coming in with complaint of bilateral knee pain. Has upcoming surgery in May for R knee. Wants to know if there is anything she can do between now and then to help. No other concerns.  Scheduled for knee replacement 5/14    Past Medical History:  Diagnosis Date   Anxiety    Arthritis    bilateral knees   Asthma    childhood   Autonomic neuropathy    diabetic   Cataract    Celiac disease    Constipation    Depression    Diabetes mellitus    type II, Hemoglobin A1C 9.9 10/05/2011   Diabetic neuropathy (HCC)    Diverticulosis    Fatty liver    Gastroparesis    GERD (gastroesophageal reflux disease)    Hyperlipidemia    Hypertension    IBS (irritable bowel syndrome)    Kidney stones    Personal history of colonic polyps 03/2010   hyperplastic   Shingles 2021   Past Surgical History:  Procedure Laterality Date   APPENDECTOMY     CATARACT EXTRACTION Right 04/29/2018   CATARACT EXTRACTION Left 03/2018   COLONOSCOPY     DILATATION & CURRETTAGE/HYSTEROSCOPY WITH RESECTOCOPE N/A 03/24/2013   Procedure: DILATATION & CURETTAGE, HYSTEROSCOPY WITH RESECTION;  Surgeon: Levi Aland, MD;  Location: [facility];  Service: Gynecology;   Laterality: N/A;   HYSTEROSCOPY WITH D & C N/A 11/08/2015   Procedure: DILATATION AND CURETTAGE /HYSTEROSCOPY;  Surgeon: Levi Aland, MD;  Location: [facility];  Service: Gynecology;  Laterality: N/A;   left breast cyst removal  2000   LEFT HEART CATH AND CORONARY ANGIOGRAPHY N/A 10/31/2018   Procedure: LEFT HEART CATH AND CORONARY ANGIOGRAPHY;  Surgeon: Corky Crafts, MD;  Location: [facility];  Service: Cardiovascular;  Laterality: N/A;   TUBAL LIGATION     Social History   Socioeconomic History   Marital status: Married    Spouse name: Devar Russaw   Number of children: 2   Years of education: Not on file   Highest education level: Not on file  Occupational History   Occupation: Conservation officer, 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    Comment: parttime   Occupation: retired  Tobacco Use   Smoking status: Never   Smokeless tobacco: Never  Vaping Use   Vaping Use: Never used  Substance and Sexual Activity   Alcohol use: No   Drug use: No   Sexual activity: Yes    Birth control/protection: Post-menopausal, Surgical  Other Topics Concern   Not on file  Social History Narrative   Not on file   Social Determinants of Health   Financial Resource Strain: Low Risk  (03/13/2022)  Overall Financial Resource Strain (CARDIA)    Difficulty of Paying Living Expenses: Not hard at all  Food Insecurity: No Food Insecurity (03/13/2022)   Hunger Vital Sign    Worried About Running Out of Food in the Last Year: Never true    Ran Out of Food in the Last Year: Never true  Transportation Needs: No Transportation Needs (03/13/2022)   PRAPARE - Administrator, Civil Service (Medical): No    Lack of Transportation (Non-Medical): No  Physical Activity: Inactive (03/13/2022)   Exercise Vital Sign    Days of Exercise per Week: 0 days    Minutes of Exercise per Session: 0 min  Stress: Stress Concern Present (03/13/2022)   [employer] of Occupational Health -  Occupational Stress Questionnaire    Feeling of Stress : To some extent  Social Connections: Moderately Integrated (03/13/2022)   Social Connection and Isolation Panel [NHANES]    Frequency of Communication with Friends and Family: More than three times a week    Frequency of Social Gatherings with Friends and Family: Once a week    Attends Religious Services: More than 4 times per year    Active Member of [employer] or Organizations: No    Attends Engineer, structural: Never    Marital Status: Married   Allergies  Allergen Reactions   Codeine     Other reaction(s): vomiting   Gluten Meal Other (See Comments)    No wheat or soy   Metronidazole     Other reaction(s): vomiting   Nsaids Nausea And Vomiting and Other (See Comments)    Cannot tolerate large doses  Other reaction(s): vomiting Other reaction(s): vomiting   Soy Allergy    Wheat    Family History  Problem Relation Age of Onset   Hypertension Mother    Colon cancer Sister        dx in her early 59s   Diabetes Sister    Endometrial cancer Sister    Hypertension Brother    Other Father        2 collapsed lungs   COPD Father    Diabetes Father    Heart attack Father    Heart failure Father    High blood pressure Father    Colon polyps Neg Hx    Esophageal cancer Neg Hx    Stomach cancer Neg Hx     Current Outpatient Medications (Endocrine & Metabolic):    Dulaglutide (TRULICITY) 0.75 MG/0.5ML SOPN, Inject into the skin weekly   insulin glargine (LANTUS SOLOSTAR) 100 UNIT/ML Solostar Pen, Inject 30 Units into the skin at bedtime.   insulin lispro (HUMALOG KWIKPEN) 100 UNIT/ML KwikPen, Inject 5-9 Units into the skin 2 (two) times daily before a meal.  Current Outpatient Medications (Cardiovascular):    atorvastatin (LIPITOR) 40 MG tablet, TAKE 1 TABLET BY MOUTH EVERY DAY   furosemide (LASIX) 20 MG tablet, TAKE 1 TABLET (20 MG TOTAL) BY MOUTH DAILY AS NEEDED FOR FLUID OR EDEMA.   olmesartan (BENICAR) 40 MG  tablet, TAKE 1 TABLET BY MOUTH EVERY DAY  Current Outpatient Medications (Respiratory):    albuterol (VENTOLIN HFA) 108 (90 Base) MCG/ACT inhaler, TAKE 2 PUFFS BY MOUTH EVERY 6 HOURS AS NEEDED FOR WHEEZE OR SHORTNESS OF BREATH  Current Outpatient Medications (Analgesics):    aspirin EC 81 MG tablet, Take 81 mg by mouth daily. Swallow whole.   ibuprofen (ADVIL) 200 MG tablet, Take 200 mg by mouth daily.   Current Outpatient Medications (Other):  dexlansoprazole (DEXILANT) 60 MG capsule, TAKE 1 CAPSULE BY MOUTH EVERY DAY   DULoxetine (CYMBALTA) 20 MG capsule, TAKE 1 CAPSULE BY MOUTH EVERY DAY   gabapentin (NEURONTIN) 600 MG tablet, TAKE 1 TABLET IN THE MORNING AND 2 TABLETS AT BEDTIME   glucose blood (ONETOUCH ULTRA) test strip, USE 2 TIMES DAILY. AND LANCETS 2/DAY   Insulin Pen Needle 32G X 4 MM MISC, Use 3-4x a day   Insulin Syringe-Needle U-100 (BD INSULIN SYRINGE U/F) 31G X 5/16" 1 ML MISC, USE UP TO 3 TIMES A DAY   ketoconazole (NIZORAL) 2 % cream, APPLY TWICE A DAY FOR 2 WEEKS AS NEEDED FOR RASH   ondansetron (ZOFRAN ODT) 4 MG disintegrating tablet, Take 1 tablet (4 mg total) by mouth every 8 (eight) hours as needed for nausea or vomiting.   polyethylene glycol powder ([employer] PURELAX) 17 GM/SCOOP powder, DISSOLVE 1 CAPFUL IN AT LEAST 8 OUNCES WATER/JUICE AND DRINK TWICE DAILY   sucralfate (CARAFATE) 1 GM/10ML suspension, Take 10 mLs (1 g total) by mouth 4 (four) times daily.   tiZANidine (ZANAFLEX) 2 MG tablet, Take 1 tablet (2 mg total) by mouth at bedtime.   valACYclovir (VALTREX) 500 MG tablet, TAKE 1 TABLET BY MOUTH EVERY DAY   Vitamin D, Ergocalciferol, (DRISDOL) 1.25 MG (50000 UNIT) CAPS capsule, Take 1 capsule (50,000 Units total) by mouth every 30 (thirty) days.   Reviewed prior external information including notes and imaging from  primary care provider As well as notes that were available from care everywhere and other healthcare systems.  Past medical history, social,  surgical and family history all reviewed in electronic medical record.  No pertanent information unless stated regarding to the chief complaint.   Review of Systems:  No headache, visual changes, nausea, vomiting, diarrhea, constipation, dizziness, abdominal pain, skin rash, fevers, chills, night sweats, weight loss, swollen lymph nodes, body aches, joint swelling, chest pain, shortness of breath, mood changes. POSITIVE muscle aches  Objective  Blood pressure 130/80, pulse 78, height 5\' 5"  (1.651 m), weight 216 lb (98 kg), SpO2 97 %.   General: No apparent distress alert and oriented x3 mood and affect normal, dressed appropriately.  HEENT: Pupils equal, extraocular movements intact  Respiratory: Patient's speak in full sentences and does not appear short of breath  Cardiovascular: No lower extremity edema, non tender, no erythema  Antalgic gait noted.  Patient does have difficulty with the left knee.  Patient does have some instability noted of the left knee.  Deferred the review of the right knee.  After informed written and verbal consent, patient was seated on exam table. Left knee was prepped with alcohol swab and utilizing anterolateral approach, patient's left knee space was injected with 2 cc of 0.5% Marcaine and 1 cc of Toradol 30 mg/mL. Patient tolerated the procedure well without immediate complications.    Impression and Recommendations:    The above documentation has been reviewed and is accurate and complete Judi Saa, DO

## 2022-08-03 ENCOUNTER — Ambulatory Visit: Payer: Medicare Other | Admitting: Bariatrics

## 2022-08-07 ENCOUNTER — Ambulatory Visit (INDEPENDENT_AMBULATORY_CARE_PROVIDER_SITE_OTHER): Payer: Medicare Other | Admitting: Family Medicine

## 2022-08-07 VITALS — BP 130/80 | HR 78 | Ht 65.0 in | Wt 216.0 lb

## 2022-08-07 DIAGNOSIS — M1712 Unilateral primary osteoarthritis, left knee: Secondary | ICD-10-CM | POA: Insufficient documentation

## 2022-08-07 DIAGNOSIS — M170 Bilateral primary osteoarthritis of knee: Secondary | ICD-10-CM | POA: Diagnosis not present

## 2022-08-07 DIAGNOSIS — M1711 Unilateral primary osteoarthritis, right knee: Secondary | ICD-10-CM | POA: Diagnosis not present

## 2022-08-07 MED ORDER — KETOROLAC TROMETHAMINE 30 MG/ML IJ SOLN
30.0000 mg | Freq: Once | INTRAMUSCULAR | Status: AC
  Administered 2022-08-07: 30 mg via INTRA_ARTICULAR

## 2022-08-07 MED ORDER — KETOROLAC TROMETHAMINE 30 MG/ML IJ SOLN: 30.0000 mg | Freq: Once | INTRAMUSCULAR | Status: DC

## 2022-08-07 NOTE — Patient Instructions (Addendum)
Injection in knee in L knee Think you'll do great with knee Move that brace to the other knee after surgery We are here if you need [place]

## 2022-08-07 NOTE — Assessment & Plan Note (Addendum)
Patient given injection and tolerated the procedure well, discussed icing regimen and home exercises.  Discussed which activities to do and which ones to avoid.  Follow-up with me again in 6 to 8 weeks after surgery avoided any type of steroid secondary to patient going to have recent surgery.

## 2022-08-07 NOTE — Assessment & Plan Note (Signed)
Patient is having replacements in 6 weeks.  Continue to use the cane until after the surgery.  Patient will be using a walker otherwise.  Patient states that she has been set up with some home health aspects but does have difficulty because of her husband also having significant health problems.  Patient is in great hands with orthopedic surgeon.

## 2022-08-08 ENCOUNTER — Encounter: Payer: Self-pay | Admitting: Family Medicine

## 2022-08-10 ENCOUNTER — Ambulatory Visit (INDEPENDENT_AMBULATORY_CARE_PROVIDER_SITE_OTHER): Payer: Medicare Other | Admitting: Bariatrics

## 2022-08-10 ENCOUNTER — Encounter: Payer: Self-pay | Admitting: Bariatrics

## 2022-08-10 DIAGNOSIS — E1169 Type 2 diabetes mellitus with other specified complication: Secondary | ICD-10-CM

## 2022-08-10 DIAGNOSIS — E559 Vitamin D deficiency, unspecified: Secondary | ICD-10-CM | POA: Diagnosis not present

## 2022-08-10 DIAGNOSIS — E669 Obesity, unspecified: Secondary | ICD-10-CM | POA: Diagnosis not present

## 2022-08-10 DIAGNOSIS — Z7985 Long-term (current) use of injectable non-insulin antidiabetic drugs: Secondary | ICD-10-CM

## 2022-08-10 DIAGNOSIS — Z6835 Body mass index (BMI) 35.0-35.9, adult: Secondary | ICD-10-CM

## 2022-08-10 MED ORDER — ONDANSETRON 4 MG PO TBDP: 4.0000 mg | ORAL_TABLET | Freq: Three times a day (TID) | ORAL | 0 refills | Status: DC | PRN

## 2022-08-10 MED ORDER — TRULICITY 0.75 MG/0.5ML ~~LOC~~ SOAJ: SUBCUTANEOUS | 0 refills | Status: DC

## 2022-08-10 NOTE — Progress Notes (Signed)
WEIGHT SUMMARY AND BIOMETRICS  Weight Gained Since Last Visit: 3lb   Vitals Temp: (!) 97.5 F (36.4 C) BP: (!) 161/74 Pulse Rate: 74 SpO2: 99 %   Anthropometric Measurements Height: 5\' 5"  (1.651 m) Weight: 211 lb (95.7 kg) BMI (Calculated): 35.11 Weight at Last Visit: 208lb Weight Lost Since Last Visit: 0 Weight Gained Since Last Visit: 3lb Starting Weight: 246lb Total Weight Loss (lbs): 35 lb (15.9 kg)   Body Composition  Body Fat %: 47.4 % Fat Mass (lbs): 100.2 lbs Muscle Mass (lbs): 105.8 lbs Total Body Water (lbs): 82.2 lbs Visceral Fat Rating : 15   Other Clinical Data Fasting: no Labs: no Today's Visit #: 40 Starting Date: 03/12/19    OBESITY Katrin is here to discuss her progress with her obesity treatment plan along with follow-up of her obesity related diagnoses.     Nutrition Plan: the Category 3 plan - 95% adherence.  Current exercise: none  Interim History:  She is up 3 lbs since her last visit.  Is not skipping meals  Pharmacotherapy: Takara is on Trulicity 0.75 mg SQ weekly Adverse side effects: None Hunger is moderately controlled.  Cravings are moderately controlled.   Assessment/Plan:   1. Vitamin D deficiency Vitamin D Deficiency Vitamin D is at goal of 50.  Most recent vitamin D level was 67.2. She is on vitamin D 50,000 IU monthly.  Lab Results  Component Value Date   VD25OH 67.2 07/06/2022   VD25OH 44.70 04/13/2021   VD25OH 85.0 09/07/2020    Plan: Continue vitamin D.     Type II Diabetes HgbA1c is at goal. Last A1c was 7.2 Episodes of hypoglycemia: no Medication(s): Trulicity 0.75 mg SQ weekly  Lab Results  Component Value Date   HGBA1C 7.2 (A) 06/14/2022   HGBA1C 6.6 (A) 02/08/2022   HGBA1C 6.3 (A) 10/19/2021   Lab Results  Component Value Date   MICROALBUR <0.7 10/19/2021   LDLCALC 72 04/12/2022   CREATININE 0.99 04/12/2022   Lab Results  Component Value Date   GFR 57.36 (L) 04/12/2022    GFR 60.62 06/09/2021   GFR 58.47 (L) 04/13/2021    Plan: Continue and refill Trulicity 0.75 mg SQ weekly. She is unable to go up in dosage due to GI upset, but may be able to in the future.  Continue all other medications.  Will keep all carbohydrates low both sweets and starches.  Will continue exercise regimen to 30 to 60 minutes on most days of the week.  Aim for 7 to 9 hours of sleep nightly.  Eat more low glycemic index foods.     Generalized Obesity: Current BMI BMI (Calculated): 35.11   Pharmacotherapy Plan Continue and refill  Trulicity 0.75 mg SQ weekly  [name] is currently in the action stage of change. As such, her goal is to continue with weight loss efforts.  She has agreed to the Category 3 plan.  Exercise goals: to stay as active as possible.   Behavioral modification strategies: increase water intake, better snacking choices, planning for success, and increasing vegetables.  Skila has agreed to follow-up with our clinic in 4 weeks.   No orders of the defined types were placed in this encounter.   There are no discontinued medications.   No orders of the defined types were placed in this encounter.     Objective:   VITALS: Per patient if applicable, see vitals. GENERAL: Alert and in no acute distress. CARDIOPULMONARY: No increased WOB. Speaking in clear  sentences.  PSYCH: Pleasant and cooperative. Speech normal rate and rhythm. Affect is appropriate. Insight and judgement are appropriate. Attention is focused, linear, and appropriate.  NEURO: Oriented as arrived to appointment on time with no prompting.   Attestation Statements:    This was prepared with the assistance of Engineer, civil (consulting).  Occasional wrong-word or sound-a-like substitutions may have occurred due to the inherent limitations of voice recognition software.   Corinna Capra, DO

## 2022-08-17 ENCOUNTER — Other Ambulatory Visit: Payer: Self-pay

## 2022-08-17 ENCOUNTER — Ambulatory Visit (INDEPENDENT_AMBULATORY_CARE_PROVIDER_SITE_OTHER): Payer: Medicare Other | Admitting: Allergy

## 2022-08-17 ENCOUNTER — Encounter: Payer: Self-pay | Admitting: Allergy

## 2022-08-17 VITALS — BP 138/82 | HR 80 | Temp 98.2°F | Resp 20 | Ht 65.0 in | Wt 215.0 lb

## 2022-08-17 DIAGNOSIS — K219 Gastro-esophageal reflux disease without esophagitis: Secondary | ICD-10-CM | POA: Diagnosis not present

## 2022-08-17 DIAGNOSIS — J310 Chronic rhinitis: Secondary | ICD-10-CM

## 2022-08-17 DIAGNOSIS — J4530 Mild persistent asthma, uncomplicated: Secondary | ICD-10-CM

## 2022-08-17 DIAGNOSIS — K900 Celiac disease: Secondary | ICD-10-CM

## 2022-08-17 DIAGNOSIS — R519 Headache, unspecified: Secondary | ICD-10-CM

## 2022-08-17 NOTE — Assessment & Plan Note (Signed)
Past history - Patient is not sure if PND is making reflux worse but symptoms more aggravated these days. Continue lifestyle and dietary modifications. Continue Dexilant  once a day. Nothing to eat or drink for 30 minutes afterwards. If not doing better, follow up with GI.

## 2022-08-17 NOTE — Assessment & Plan Note (Signed)
Unchanged. No issues with her teeth. Consider neurology evaluation next.

## 2022-08-17 NOTE — Assessment & Plan Note (Signed)
Past history - Perennial rhinitis symptoms which have flared the last 4 months.  Had 2 courses of antibiotics and prednisone with no benefit.  2017 skin testing was positive to multiple molds, grass, weed, trees and cat.  No prior AIT. Zyrtec, Claritin and Flonase ineffective. 2023 skin testing showed: Negative to indoor/outdoor allergens and common foods. Ryaltris too expensive.  Interim history - 2024 bloodwork negative to environmental panel, IgE only 23. Saw ENT had CT sinus, dry nasal mucosa. Symptoms unchanged. Discussed with patient at length that you can have symptoms and not have any environmental allergies. She most likely has chronic non-allergic rhinitis. Due to PND briefly discussed starting Atrovent nasal spray to dry up the drainage but rhinoscopy showed dry nasal passages. Will have patient follow up with ENT regarding whether a different nasal spray may help with her symptoms.  Continue with ENT recommendations - saline rinse and mupirocin. Follow up with ENT.

## 2022-08-17 NOTE — Progress Notes (Signed)
Follow Up Note  RE: Jessica [name] MRN: [id number] DOB: 1950-12-14 Date of Office Visit: 08/17/2022  Referring provider: Willow Ora, MD Primary care provider: Willow Ora, MD  Chief Complaint: Follow-up (Pt states she still has headaches and seasonal allergies going on only using her inhaler when needed.)  History of Present Illness: I had the pleasure of seeing Jessica [name] for a follow up visit at the [facility] on 08/17/2022. She is a 72 y.o. female, who is being followed for chronic rhinitis, asthma, GERD and celiac. Her previous allergy office visit was on 06/20/2022 with Dr. Selena Batten. Today is a regular follow up visit.  Chronic rhinitis Saw ENT in March. Took some antibiotics with no benefit. Still having headaches and has PND. Had CT done as well.  Using saline rinse which initially got some yellow mucous out but now not sure if it helps.   Mild persistent asthma  Only used albuterol once since the last visit. Wheezing last night but did not use albuterol.  Denies any ER/urgent care visits or prednisone use since the last visit.  Gastroesophageal reflux disease Takes Dexilant  once a day with good benefit.    06/30/2022 ENT visit: "Impression & Plans:  Jessica [name] is a 72 y.o. female with symptoms of nasal burning, facial pain, and congestion. Recent allergy testing indeterminate. History of GERD, on daily PPI. Complete head and neck examination today including nasolaryngoscopy demonstrated dry nasal mucosa with honey colored crusting as well as interarytenoid edema consistent with known history of GERD. No evidence of mucopurulent drainage or nasal polyps. I recommended beginning twice daily nasal saline rinses and applying Mupirocin ointment to bilateral nares twice a day. I agree that patient's reported symptoms do seem more consistent with allergy, however, in light of initially negative testing and ongoing symptoms of facial pressure, I  recommend obtaining a CT sinus for further evaluation. I will call patient to review results, once available. "  07/03/2022 CT sinus: "IMPRESSION: Bilateral frontal, left ethmoid, right sphenoid sinus disease. "  Assessment and Plan: Jessica [name] is a 72 y.o. female with: Chronic rhinitis Past history - Perennial rhinitis symptoms which have flared the last 4 months.  Had 2 courses of antibiotics and prednisone with no benefit.  2017 skin testing was positive to multiple molds, grass, weed, trees and cat.  No prior AIT. Zyrtec, Claritin and Flonase ineffective. 2023 skin testing showed: Negative to indoor/outdoor allergens and common foods. Ryaltris too expensive.  Interim history - 2024 bloodwork negative to environmental panel, IgE only 23. Saw ENT had CT sinus, dry nasal mucosa. Symptoms unchanged. Discussed with patient at length that you can have symptoms and not have any environmental allergies. She most likely has chronic non-allergic rhinitis. Due to PND briefly discussed starting Atrovent nasal spray to dry up the drainage but rhinoscopy showed dry nasal passages. Will have patient follow up with ENT regarding whether a different nasal spray may help with her symptoms.  Continue with ENT recommendations - saline rinse and mupirocin. Follow up with ENT.  Mild persistent asthma without complication Past history - Diagnosed with asthma many years ago and recently has been needing to use albuterol about 3 times per week for wheezing. 2023 spirometry shows some restriction with no improvement in FEV1 but there was a 23% improvement in the small airways post bronchodilator treatment.  Clinically feeling unchanged. Interim history - rare inhaler use. Today's spirometry was normal. May use albuterol rescue inhaler 2 puffs  every 4 to 6 hours as needed for shortness of breath, chest tightness, coughing, and wheezing. Monitor frequency of use.   Gastroesophageal reflux disease Past history - Patient is  not sure if PND is making reflux worse but symptoms more aggravated these days. Continue lifestyle and dietary modifications. Continue Dexilant 60mg  once a day. Nothing to eat or drink for 30 minutes afterwards. If not doing better, follow up with GI.  Celiac disease Continue to avoid gluten and soy (apparently this also causes issues for her).  Generalized headache Unchanged. No issues with her teeth. Consider neurology evaluation next.   Return if symptoms worsen or fail to improve.  No orders of the defined types were placed in this encounter.  Lab Orders  No laboratory test(s) ordered today    Diagnostics: Spirometry:  Tracings reviewed. Her effort: Good reproducible efforts. FVC: 2.35L FEV1: 1.86L, 98% predicted FEV1/FVC ratio: 79% Interpretation: Spirometry consistent with normal pattern.  Please see scanned spirometry results for details.  Medication List:  Current Outpatient Medications  Medication Sig Dispense Refill   albuterol (VENTOLIN HFA) 108 (90 Base) MCG/ACT inhaler TAKE 2 PUFFS BY MOUTH EVERY 6 HOURS AS NEEDED FOR WHEEZE OR SHORTNESS OF BREATH 6.7 each 3   aspirin EC 81 MG tablet Take 81 mg by mouth daily. Swallow whole.     atorvastatin (LIPITOR) 40 MG tablet TAKE 1 TABLET BY MOUTH EVERY DAY 90 tablet 3   dexlansoprazole (DEXILANT) 60 MG capsule TAKE 1 CAPSULE BY MOUTH EVERY DAY 90 capsule 3   Dulaglutide (TRULICITY) 0.75 MG/0.5ML SOPN Inject into the skin weekly 2 mL 0   DULoxetine (CYMBALTA) 20 MG capsule TAKE 1 CAPSULE BY MOUTH EVERY DAY 90 capsule 1   furosemide (LASIX) 20 MG tablet TAKE 1 TABLET (20 MG TOTAL) BY MOUTH DAILY AS NEEDED FOR FLUID OR EDEMA. 90 tablet 3   gabapentin (NEURONTIN) 600 MG tablet TAKE 1 TABLET IN THE MORNING AND 2 TABLETS AT BEDTIME 270 tablet 2   glucose blood (ONETOUCH ULTRA) test strip USE 2 TIMES DAILY. AND LANCETS 2/DAY 200 strip 3   ibuprofen (ADVIL) 200 MG tablet Take 200 mg by mouth daily.     insulin glargine (LANTUS  SOLOSTAR) 100 UNIT/ML Solostar Pen Inject 30 Units into the skin at bedtime. 15 mL 3   insulin lispro (HUMALOG KWIKPEN) 100 UNIT/ML KwikPen Inject 5-9 Units into the skin 2 (two) times daily before a meal. 15 mL 1   Insulin Pen Needle 32G X 4 MM MISC Use 3-4x a day 300 each 3   Insulin Syringe-Needle U-100 (BD INSULIN SYRINGE U/F) 31G X 5/16" 1 ML MISC USE UP TO 3 TIMES A DAY 100 each 5   ketoconazole (NIZORAL) 2 % cream APPLY TWICE A DAY FOR 2 WEEKS AS NEEDED FOR RASH 30 g 2   olmesartan (BENICAR) 40 MG tablet TAKE 1 TABLET BY MOUTH EVERY DAY 90 tablet 3   ondansetron (ZOFRAN ODT) 4 MG disintegrating tablet Take 1 tablet (4 mg total) by mouth every 8 (eight) hours as needed for nausea or vomiting. 30 tablet 0   polyethylene glycol powder ([employer] PURELAX) 17 GM/SCOOP powder DISSOLVE 1 CAPFUL IN AT LEAST 8 OUNCES WATER/JUICE AND DRINK TWICE DAILY 1020 g 5   sucralfate (CARAFATE) 1 GM/10ML suspension Take 10 mLs (1 g total) by mouth 4 (four) times daily. 420 mL 1   tiZANidine (ZANAFLEX) 2 MG tablet Take 1 tablet (2 mg total) by mouth at bedtime. 30 tablet 0   valACYclovir (  VALTREX) 500 MG tablet TAKE 1 TABLET BY MOUTH EVERY DAY 90 tablet 3   Vitamin D, Ergocalciferol, (DRISDOL) 1.25 MG (50000 UNIT) CAPS capsule Take 1 capsule (50,000 Units total) by mouth every 30 (thirty) days. 3 capsule 0   No current facility-administered medications for this visit.   Allergies: Allergies  Allergen Reactions   Codeine     Other reaction(s): vomiting   Gluten Meal Other (See Comments)    No wheat or soy   Metronidazole     Other reaction(s): vomiting   Nsaids Nausea And Vomiting and Other (See Comments)    Cannot tolerate large doses  Other reaction(s): vomiting Other reaction(s): vomiting   Soy Allergy    Wheat    I reviewed her past medical history, social history, family history, and environmental history and no significant changes have been reported from her previous visit.  Review of Systems   Constitutional:  Negative for appetite change, chills, fever and unexpected weight change.  HENT:  Positive for congestion, postnasal drip, rhinorrhea, sinus pressure and sore throat.   Eyes:  Negative for itching.  Respiratory:  Negative for cough, chest tightness, shortness of breath and wheezing.   Cardiovascular:  Negative for chest pain.  Gastrointestinal:  Negative for abdominal pain.  Genitourinary:  Negative for difficulty urinating.  Skin:  Negative for rash.  Allergic/Immunologic: Negative for environmental allergies and food allergies.  Neurological:  Positive for headaches.    Objective: BP 138/82   Pulse 80   Temp 98.2 F (36.8 C)   Resp 20   Ht  (1.651 m)   Wt 215 lb (97.5 kg)   SpO2 96%   BMI 35.78 kg/m  Body mass index is 35.78 kg/m. Physical Exam Vitals and nursing note reviewed.  Constitutional:      Appearance: Normal appearance. She is well-developed. She is obese.  HENT:     Head: Normocephalic and atraumatic.     Right Ear: Tympanic membrane and external ear normal.     Left Ear: Tympanic membrane and external ear normal.     Nose: Nose normal.     Mouth/Throat:     Mouth: Mucous membranes are moist.     Pharynx: Oropharynx is clear.  Eyes:     Conjunctiva/sclera: Conjunctivae normal.  Cardiovascular:     Rate and Rhythm: Normal rate and regular rhythm.     Heart sounds: Normal heart sounds. No murmur heard.    No friction rub. No gallop.  Pulmonary:     Effort: Pulmonary effort is normal.     Breath sounds: Normal breath sounds. No wheezing, rhonchi or rales.  Musculoskeletal:     Cervical back: Neck supple.  Skin:    General: Skin is warm.     Findings: No rash.  Neurological:     Mental Status: She is alert and oriented to person, place, and time.  Psychiatric:        Behavior: Behavior normal.    Previous notes and tests were reviewed. The plan was reviewed with the patient/family, and all questions/concerned were  addressed.  It was my pleasure to see Chenel today and participate in her care. Please feel free to contact me with any questions or concerns.  Sincerely,  Wyline Mood, DO Allergy & Immunology  [facility] office: [phone] [facility] office: [phone]

## 2022-08-17 NOTE — Assessment & Plan Note (Signed)
Past history - Diagnosed with asthma many years ago and recently has been needing to use albuterol about 3 times per week for wheezing. 2023 spirometry shows some restriction with no improvement in FEV1 but there was a 23% improvement in the small airways post bronchodilator treatment.  Clinically feeling unchanged. Interim history - rare inhaler use. Today's spirometry was normal. May use albuterol rescue inhaler 2 puffs every 4 to 6 hours as needed for shortness of breath, chest tightness, coughing, and wheezing. Monitor frequency of use.

## 2022-08-17 NOTE — Patient Instructions (Addendum)
Chronic NON-allergic rhinitis. Your bloodwork and your allergy skin testing was negative. Continue with ENT recommendations. Follow up with ENT.   Breathing Normal breathing test today.  May use albuterol rescue inhaler 2 puffs every 4 to 6 hours as needed for shortness of breath, chest tightness, coughing, and wheezing. Monitor frequency of use.  Breathing control goals:  Full participation in all desired activities (may need albuterol before activity) Albuterol use two times or less a week on average (not counting use with activity) Cough interfering with sleep two times or less a month Oral steroids no more than once a year No hospitalizations    Heartburn: Continue lifestyle and dietary modifications. Continue Dexilant  once a day. Nothing to eat or drink for 30 minutes afterwards. If not doing better, follow up with GI.  Food Continue to avoid gluten and soy.  Headaches Consider neurology evaluation next.   Follow up with me only if needed.

## 2022-08-17 NOTE — Assessment & Plan Note (Signed)
Continue to avoid gluten and soy (apparently this also causes issues for her). 

## 2022-08-21 ENCOUNTER — Other Ambulatory Visit: Payer: Self-pay

## 2022-08-25 ENCOUNTER — Other Ambulatory Visit: Payer: Self-pay | Admitting: Physician Assistant

## 2022-08-25 DIAGNOSIS — Z01818 Encounter for other preprocedural examination: Secondary | ICD-10-CM

## 2022-08-30 NOTE — Pre-Procedure Instructions (Signed)
Surgical Instructions    Your procedure is scheduled on Sep 12, 2022.  Report to [facility] Main Entrance "A" at 05:30 A.M., then check in with the Admitting office.  Call this number if you have problems the morning of surgery:  [phone]   If you have any questions prior to your surgery date call [phone]: Open Monday-Friday 8am-4pm If you experience any cold or flu symptoms such as cough, fever, chills, shortness of breath, etc. between now and your scheduled surgery, please notify us at the above number     Remember:  Do not eat after midnight the night before your surgery  You may drink clear liquids until 04:30am the morning of your surgery.   Clear liquids allowed are: Water, Non-Citrus Juices (without pulp), Carbonated Beverages, Clear Tea, Black Coffee ONLY (NO MILK, CREAM OR POWDERED CREAMER of any kind), and Gatorade   Enhanced Recovery after Surgery for Orthopedics Enhanced Recovery after Surgery is a protocol used to improve the stress on your body and your recovery after surgery.  Patient Instructions  The day of surgery:  Drink ONE small 10 oz bottle of water or G2 Gatorade by 4:30 am the morning of surgery This bottle was given to you during your hospital  pre-op appointment visit.  Nothing else to drink after completing the  Small 10 oz bottle of water or G2 Gatorade.         If you have questions, please contact your surgeon's office.     Take these medicines the morning of surgery with A SIP OF WATER:  Atorvastatin (Lipitor) Dexlansoprazole (Dexilant) Duloxetine (Cymbalta) Gabapentin (Neurontin) Valacyclovir (Valtrex)  If needed: Albuterol (Ventolin HFA) Ondansetron (Zofran ODT) Sucralfate (Carafate)  Follow your surgeon's instructions on when to stop Aspirin.  If no instructions were given by your surgeon then you will need to call the office to get those instructions.     As of today, STOP taking any Aleve, Naproxen, Ibuprofen, Motrin,  Advil, Goody's, BC's, all herbal medications, fish oil, and all vitamins.  WHAT DO I DO ABOUT MY DIABETES MEDICATION?   STOP YOUR TRULICITY 1 WEEK PRIOR TO SURGERY.  THE NIGHT BEFORE SURGERY, take 15 units of Insulin Glargine (LANTUS SOLOSTAR) insulin which is a half dose.     If your CBG is greater than 220 mg/dL, you may take  of your sliding scale (correction) dose of insulin. Take a half dose of your Insulin Lispro the morning of surgery only if your CBG is greater than 220 mg/dL.   HOW TO MANAGE YOUR DIABETES BEFORE AND AFTER SURGERY  Why is it important to control my blood sugar before and after surgery? Improving blood sugar levels before and after surgery helps healing and can limit problems. A way of improving blood sugar control is eating a healthy diet by:  Eating less sugar and carbohydrates  Increasing activity/exercise  Talking with your doctor about reaching your blood sugar goals High blood sugars (greater than 180 mg/dL) can raise your risk of infections and slow your recovery, so you will need to focus on controlling your diabetes during the weeks before surgery. Make sure that the doctor who takes care of your diabetes knows about your planned surgery including the date and location.  How do I manage my blood sugar before surgery? Check your blood sugar at least 4 times a day, starting 2 days before surgery, to make sure that the level is not too high or low.  Check your blood sugar the morning  of your surgery when you wake up and every 2 hours until you get to the Short Stay unit.  If your blood sugar is less than 70 mg/dL, you will need to treat for low blood sugar: Do not take insulin. Treat a low blood sugar (less than 70 mg/dL) with  cup of clear juice (cranberry or apple), 4 glucose tablets, OR glucose gel. Recheck blood sugar in 15 minutes after treatment (to make sure it is greater than 70 mg/dL). If your blood sugar is not greater than 70 mg/dL on  recheck, call [phone] for further instructions. Report your blood sugar to the short stay nurse when you get to Short Stay.  If you are admitted to the hospital after surgery: Your blood sugar will be checked by the staff and you will probably be given insulin after surgery (instead of oral diabetes medicines) to make sure you have good blood sugar levels. The goal for blood sugar control after surgery is 80-180 mg/dL.   Do NOT Smoke (Tobacco/Vaping)  24 hours prior to your procedure  If you use a CPAP at night, you may bring your mask for your overnight stay.   Contacts, glasses, hearing aids, dentures or partials may not be worn into surgery, please bring cases for these belongings   For patients admitted to the hospital, discharge time will be determined by your treatment team.   Patients discharged the day of surgery will not be allowed to drive home, and someone needs to stay with them for 24 hours.   SURGICAL WAITING ROOM VISITATION Patients having surgery or a procedure may have no more than 2 support people in the waiting area - these visitors may rotate.   Children under the age of 94 must have an adult with them who is not the patient. If the patient needs to stay at the hospital during part of their recovery, the visitor guidelines for inpatient rooms apply. Pre-op nurse will coordinate an appropriate time for 1 support person to accompany patient in pre-op.  This support person may not rotate.   Please refer to [email or web address] for the visitor guidelines for Inpatients (after your surgery is over and you are in a regular room).    Special instructions:    Oral Hygiene is also important to reduce your risk of infection.  Remember - BRUSH YOUR TEETH THE MORNING OF SURGERY WITH YOUR REGULAR TOOTHPASTE   [facility]- Preparing For Surgery  Before surgery, you can play an important role. Because skin is not sterile,  your skin needs to be as free of germs as possible. You can reduce the number of germs on your skin by washing with CHG (chlorahexidine gluconate) Soap before surgery.  CHG is an antiseptic cleaner which kills germs and bonds with the skin to continue killing germs even after washing.     Please do not use if you have an allergy to CHG or antibacterial soaps. If your skin becomes reddened/irritated stop using the CHG.  Do not shave (including legs and underarms) for at least 48 hours prior to first CHG shower. It is OK to shave your face.  Please follow the instructions on the handout you received at your pre-admission appointment about preparing for your upcoming surgery using the CHG surgical soap. If you have any questions or concerns, please call one of the numbers listed on the first page of this paperwork.   Day of Surgery:  Take a shower with CHG soap. Do not wear jewelry or makeup.  Do not wear lotions, powders, perfumes or deodorant. Do not shave 48 hours prior to surgery.  Do not bring valuables to the hospital. Do not wear nail polish, gel polish, artificial nails, or any other type of covering on natural nails (fingers and toes) If you have artificial nails or gel coating that need to be removed by a nail salon, please have this removed prior to surgery. Artificial nails or gel coating may interfere with anesthesia's ability to adequately monitor your vital signs.  [facility] is not responsible for any belongings or valuables.   Wear Clean/Comfortable clothing the morning of surgery Do not apply any deodorants/lotions.   Remember to brush your teeth WITH YOUR REGULAR TOOTHPASTE.    If you received a COVID test during your pre-op visit, it is requested that you wear a mask when out in public, stay away from anyone that may not be feeling well, and notify your surgeon if you develop symptoms. If you have been in contact with anyone that has tested positive in the last 10 days,  please notify your surgeon.    Please read over the following fact sheets that you were given.

## 2022-08-31 ENCOUNTER — Encounter (HOSPITAL_COMMUNITY)
Admission: RE | Admit: 2022-08-31 | Discharge: 2022-08-31 | Disposition: A | Discharge status: Home / Self Care | Payer: Medicare Other | Source: Ambulatory Visit | Attending: Orthopaedic Surgery | Admitting: Orthopaedic Surgery

## 2022-08-31 ENCOUNTER — Other Ambulatory Visit: Payer: Self-pay

## 2022-08-31 ENCOUNTER — Encounter (HOSPITAL_COMMUNITY): Payer: Self-pay

## 2022-08-31 ENCOUNTER — Other Ambulatory Visit: Payer: Self-pay | Admitting: Bariatrics

## 2022-08-31 VITALS — BP 133/64 | HR 90 | Temp 98.6°F | Resp 20 | Ht 65.0 in | Wt 213.0 lb

## 2022-08-31 DIAGNOSIS — Z794 Long term (current) use of insulin: Secondary | ICD-10-CM | POA: Insufficient documentation

## 2022-08-31 DIAGNOSIS — E119 Type 2 diabetes mellitus without complications: Secondary | ICD-10-CM

## 2022-08-31 DIAGNOSIS — E559 Vitamin D deficiency, unspecified: Secondary | ICD-10-CM

## 2022-08-31 DIAGNOSIS — Z01818 Encounter for other preprocedural examination: Secondary | ICD-10-CM | POA: Diagnosis present

## 2022-08-31 HISTORY — DX: Personal history of other mental and behavioral disorders: Z86.59

## 2022-08-31 LAB — COMPREHENSIVE METABOLIC PANEL
ALT: 16 U/L (ref 0–44)
AST: 19 U/L (ref 15–41)
Albumin: 3.5 g/dL (ref 3.5–5.0)
Alkaline Phosphatase: 73 U/L (ref 38–126)
Anion gap: 7 (ref 5–15)
BUN: 15 mg/dL (ref 8–23)
CO2: 27 mmol/L (ref 22–32)
Calcium: 9.1 mg/dL (ref 8.9–10.3)
Chloride: 104 mmol/L (ref 98–111)
Creatinine, Ser: 1.08 mg/dL — ABNORMAL HIGH (ref 0.44–1.00)
GFR, Estimated: 55 mL/min — ABNORMAL LOW (ref >60)
Glucose, Bld: 124 mg/dL — ABNORMAL HIGH (ref 70–99)
Potassium: 4.3 mmol/L (ref 3.5–5.1)
Sodium: 138 mmol/L (ref 135–145)
Total Bilirubin: 0.6 mg/dL (ref 0.3–1.2)
Total Protein: 6.5 g/dL (ref 6.5–8.1)

## 2022-08-31 LAB — CBC
HCT: 39.9 % (ref 36.0–46.0)
Hemoglobin: 13.7 g/dL (ref 12.0–15.0)
MCH: 33.6 pg (ref 26.0–34.0)
MCHC: 34.3 g/dL (ref 30.0–36.0)
MCV: 97.8 fL (ref 80.0–100.0)
Platelets: 232 10*3/uL (ref 150–400)
RBC: 4.08 MIL/uL (ref 3.87–5.11)
RDW: 11.9 % (ref 11.5–15.5)
WBC: 3.5 10*3/uL — ABNORMAL LOW (ref 4.0–10.5)
nRBC: 0 % (ref 0.0–0.2)

## 2022-08-31 LAB — SURGICAL PCR SCREEN
MRSA, PCR: NEGATIVE
Staphylococcus aureus: NEGATIVE

## 2022-08-31 LAB — HEMOGLOBIN A1C
Hgb A1c MFr Bld: 6.3 % — ABNORMAL HIGH (ref 4.8–5.6)
Mean Plasma Glucose: 134.11 mg/dL

## 2022-08-31 LAB — TYPE AND SCREEN
ABO/RH(D): B POS
Antibody Screen: NEGATIVE

## 2022-08-31 LAB — GLUCOSE, CAPILLARY: Glucose-Capillary: 163 mg/dL — ABNORMAL HIGH (ref 70–99)

## 2022-08-31 NOTE — Progress Notes (Signed)
PCP - Asencion Partridge Cardiologist - Eldridge Dace- pt had cardiac workup in 2020 d/t chest pain- pt denies any cardiac symptoms since then and states she was told that she did not need to follow up.  Endocrinologist- Carlus Pavlov PPM/ICD - denies   Chest x-ray - N/A EKG - 08/31/2022  Stress Test - 10/23/18 ECHO - 05/06/18 Cardiac Cath - 10/31/18  Sleep Study - pt reports she had a sleep study once but only slept 15 minutes. Pt repots study was inconclusive.    Fasting Blood Sugar - Pt reports AM CBG to be around 120 and PM CBG to be around 140-150 at home. Pt checks BID. A1c drawn today.    Last dose of GLP1 agonist-  Trulicity- last dose 08/30/22.    Blood Thinner Instructions: N/A Aspirin Instructions:Follow your surgeon's instructions on when to stop Aspirin.  If no instructions were given by your surgeon then you will need to call the office to get those instructions.     ERAS Protcol - ERAS + G2   COVID TEST- n/A   Anesthesia review: No- pt states that her Gynecologist told her a few years ago that he heard a heart murmur. Pt reports that no other MD has told her this. I spoke with [name] during PAT appointment. No need for PA to see pt at PAT appt. Pt with normal cardiac  ECHO in 2022.   Patient denies shortness of breath, fever, cough and chest pain at PAT appointment   All instructions explained to the patient, with a verbal understanding of the material. Patient agrees to go over the instructions while at home for a better understanding. The opportunity to ask questions was provided.

## 2022-09-04 ENCOUNTER — Telehealth: Payer: Self-pay | Admitting: Orthopaedic Surgery

## 2022-09-04 NOTE — Telephone Encounter (Signed)
Patient called in she is scheduled for surgery 05/14 she need to know when to stop Asprin 81 mg and to make sure someone helps her with setting up her Rehab (inpatient) for after surgery please advise

## 2022-09-04 NOTE — Telephone Encounter (Signed)
LMOM for patient of the below message  

## 2022-09-05 ENCOUNTER — Ambulatory Visit (INDEPENDENT_AMBULATORY_CARE_PROVIDER_SITE_OTHER): Payer: Medicare Other | Admitting: Bariatrics

## 2022-09-05 ENCOUNTER — Encounter: Payer: Self-pay | Admitting: Bariatrics

## 2022-09-05 VITALS — BP 143/74 | HR 74 | Temp 97.7°F | Ht 65.0 in | Wt 209.0 lb

## 2022-09-05 DIAGNOSIS — E1169 Type 2 diabetes mellitus with other specified complication: Secondary | ICD-10-CM

## 2022-09-05 DIAGNOSIS — Z6834 Body mass index (BMI) 34.0-34.9, adult: Secondary | ICD-10-CM

## 2022-09-05 DIAGNOSIS — E559 Vitamin D deficiency, unspecified: Secondary | ICD-10-CM

## 2022-09-05 DIAGNOSIS — E669 Obesity, unspecified: Secondary | ICD-10-CM

## 2022-09-05 DIAGNOSIS — Z7985 Long-term (current) use of injectable non-insulin antidiabetic drugs: Secondary | ICD-10-CM

## 2022-09-05 MED ORDER — TRULICITY 0.75 MG/0.5ML ~~LOC~~ SOAJ: SUBCUTANEOUS | 0 refills | Status: DC

## 2022-09-05 MED ORDER — VITAMIN D (ERGOCALCIFEROL) 1.25 MG (50000 UNIT) PO CAPS
ORAL_CAPSULE | ORAL | 0 refills | Status: DC
Start: 2022-09-05 — End: 2022-10-02

## 2022-09-06 ENCOUNTER — Ambulatory Visit: Payer: Medicare Other | Admitting: Bariatrics

## 2022-09-06 NOTE — Progress Notes (Signed)
Chief Complaint:   OBESITY Jessica [name] is here to discuss her progress with her obesity treatment plan along with follow-up of her obesity related diagnoses. Jessica [name] is on the Category 3 Plan and states she is following her eating plan approximately 95% of the time. Jessica [name] states she is doing 0 minutes 0 times per week.  Today's visit was #: 41 Starting weight: 246 lbs Starting date: 03/12/2019 Today's weight: 209 lbs Today's date: 09/05/2022 Total lbs lost to date: 37 Total lbs lost since last in-office visit: 2  Interim History: Jessica [name] is down an additional 2 lbs since her last visit.   Subjective:   1. Vitamin D deficiency Jessica [name] is taking Vitamin D as directed.   2. Type 2 diabetes mellitus with obesity (HCC) Jessica [name] has been taking Trulicity, but she will be off it for 2 weeks. She has been unable to take a higher dose secondary to nausea. Her last A1c was 6.3.  Assessment/Plan:   1. Vitamin D deficiency Jessica [name] will continue prescription Vitamin D, and we will refill for 1 month.   - Vitamin D, Ergocalciferol, (DRISDOL) 1.25 MG (50000 UNIT) CAPS capsule; TAKE 1 CAPSULE (50,000 UNITS TOTAL) BY MOUTH EVERY 30 DAYS  Dispense: 3 capsule; Refill: 0  2. Type 2 diabetes mellitus with obesity (HCC) Jessica [name] will continue Trulicity at 0.75 mg once weekly, and we will refill for 1 month. We will consider increasing her dose at her next visit.   - Dulaglutide (TRULICITY) 0.75 MG/0.5ML SOPN; Inject into the skin weekly  Dispense: 2 mL; Refill: 0  3. Morbid obesity (HCC)  4. BMI 34.0-34.9,adult Jessica [name] is currently in the action stage of change. As such, her goal is to continue with weight loss efforts. She has agreed to the Category 3 Plan.   Meal planning and intentional eating were discussed. She will follow the meal plan closely.   Exercise goals: No exercise has been prescribed at this time.  Behavioral modification strategies: increasing lean protein intake, decreasing simple  carbohydrates, increasing vegetables, increasing water intake, decreasing eating out, no skipping meals, meal planning and cooking strategies, and keeping healthy foods in the home.  Jessica [name] has agreed to follow-up with our clinic in 5 to 6 weeks. She was informed of the importance of frequent follow-up visits to maximize her success with intensive lifestyle modifications for her multiple health conditions.   Objective:   Blood pressure (!) 143/74, pulse 74, temperature 97.7 F (36.5 C), height 5\' 5"  (1.651 m), weight 209 lb (94.8 kg), SpO2 95 %. Body mass index is 34.78 kg/m.  General: Cooperative, alert, well developed, in no acute distress. HEENT: Conjunctivae and lids unremarkable. Cardiovascular: Regular rhythm.  Lungs: Normal work of breathing. Neurologic: No focal deficits.   Lab Results  Component Value Date   CREATININE 1.08 (H) 08/31/2022   BUN 15 08/31/2022   NA 138 08/31/2022   K 4.3 08/31/2022   CL 104 08/31/2022   CO2 27 08/31/2022   Lab Results  Component Value Date   ALT 16 08/31/2022   AST 19 08/31/2022   ALKPHOS 73 08/31/2022   BILITOT 0.6 08/31/2022   Lab Results  Component Value Date   HGBA1C 6.3 (H) 08/31/2022   HGBA1C 7.2 (A) 06/14/2022   HGBA1C 6.6 (A) 02/08/2022   HGBA1C 6.3 (A) 10/19/2021   HGBA1C 7.7 (A) 04/13/2021   No results found for: "INSULIN" Lab Results  Component Value Date   TSH 1.04 04/12/2022   Lab Results  Component Value Date  CHOL 145 04/12/2022   HDL 54.40 04/12/2022   LDLCALC 72 04/12/2022   LDLDIRECT 143.2 11/23/2006   TRIG 93.0 04/12/2022   CHOLHDL 3 04/12/2022   Lab Results  Component Value Date   VD25OH 67.2 07/06/2022   VD25OH 44.70 04/13/2021   VD25OH 85.0 09/07/2020   Lab Results  Component Value Date   WBC 3.5 (L) 08/31/2022   HGB 13.7 08/31/2022   HCT 39.9 08/31/2022   MCV 97.8 08/31/2022   PLT 232 08/31/2022   Lab Results  Component Value Date   IRON 113 09/07/2020   TIBC 276 09/07/2020    FERRITIN 149 09/07/2020   Attestation Statements:   Reviewed by clinician on day of visit: allergies, medications, problem list, medical history, surgical history, family history, social history, and previous encounter notes.   Trude Mcburney, am acting as Energy manager for [employer], DO.  I have reviewed the above documentation for accuracy and completeness, and I agree with the above. Corinna Capra, DO

## 2022-09-11 ENCOUNTER — Telehealth: Payer: Self-pay | Admitting: Orthopaedic Surgery

## 2022-09-11 NOTE — H&P (Signed)
TOTAL KNEE ADMISSION H&P  Patient is being admitted for right total knee arthroplasty.  Subjective:  Chief Complaint:right knee pain.  HPI: Jessica [name], 72 y.o. female, has a history of pain and functional disability in the right knee due to arthritis and has failed non-surgical conservative treatments for greater than 12 weeks to includeNSAID's and/or analgesics, corticosteriod injections, viscosupplementation injections, flexibility and strengthening excercises, use of assistive devices, weight reduction as appropriate, and activity modification.  Onset of symptoms was gradual, starting 4 years ago with gradually worsening course since that time. The patient noted no past surgery on the right knee(s).  Patient currently rates pain in the right knee(s) at 10 out of 10 with activity. Patient has night pain, worsening of pain with activity and weight bearing, pain that interferes with activities of daily living, pain with passive range of motion, crepitus, and joint swelling.  Patient has evidence of subchondral sclerosis, periarticular osteophytes, and joint space narrowing by imaging studies. There is no active infection.  Patient Active Problem List   Diagnosis Date Noted   Generalized headache 08/17/2022   Degenerative arthritis of left knee 08/07/2022   BMI 34.0-34.9,adult 06/08/2022   Chronic rhinitis 04/18/2022   Mild persistent asthma without complication 04/18/2022   Gastroesophageal reflux disease 04/18/2022   Vitamin D deficiency 04/13/2022   Type 2 diabetes mellitus with obesity (HCC) 04/13/2022   Morbid obesity (HCC) 04/13/2022   Peripheral sensory neuropathy due to type 2 diabetes mellitus (HCC) 04/05/2020   Bilateral lower extremity edema 01/20/2020   Family history of malignant neoplasm of endometrium 10/08/2018   Celiac disease 02/14/2018   Osteopenia 02/14/2018   Mild intermittent asthma 02/14/2018   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)  02/14/2018   Class 2 obesity due to excess calories with body mass index (BMI) of 37.0 to 37.9 in adult 02/14/2018   Fibroma of tongue 06/06/2016   Renal calculi 01/13/2016   Unilateral primary osteoarthritis, right knee 10/29/2015   Trigger point of right shoulder region 08/02/2015   Degenerative cervical disc 07/27/2015   OSA (obstructive sleep apnea) 02/28/2011   Constipation, slow transit 09/23/2010   Type 2 diabetes mellitus with complication, with long-term current use of insulin (HCC) 09/23/2010   FH: colon cancer 09/23/2010   Personal history of colonic polyps 09/23/2010   Combined hyperlipidemia associated with type 2 diabetes mellitus (HCC) 01/29/2009   Diabetic gastroparesis (HCC) 02/27/2007   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) 10/22/2006   Essential hypertension 10/22/2006   Past Medical History:  Diagnosis Date   Anxiety    Arthritis    bilateral knees   Asthma    childhood, inhaler for wheezing PRN   Autonomic neuropathy    diabetic   Cataract    Celiac disease    Constipation    Depression    Diabetes mellitus    type II, Hemoglobin A1C 9.9 10/05/2011   Diabetic neuropathy (HCC)    Diverticulosis    Fatty liver    Gastroparesis    GERD (gastroesophageal reflux disease)    History of claustrophobia    with MRI tests   Hyperlipidemia    Hypertension    IBS (irritable bowel syndrome)    Kidney stones    Personal history of colonic polyps 03/2010   hyperplastic   PONV (postoperative nausea and vomiting)    Shingles 2021    Past Surgical History:  Procedure Laterality Date   APPENDECTOMY     CATARACT  EXTRACTION Right 04/29/2018   CATARACT EXTRACTION Left 03/2018   COLONOSCOPY     DILATATION & CURRETTAGE/HYSTEROSCOPY WITH RESECTOCOPE N/A 03/24/2013   Procedure: DILATATION & CURETTAGE, HYSTEROSCOPY WITH RESECTION;  Surgeon: Levi Aland, MD;  Location: [facility];  Service: Gynecology;  Laterality: N/A;    HYSTEROSCOPY WITH D & C N/A 11/08/2015   Procedure: DILATATION AND CURETTAGE /HYSTEROSCOPY;  Surgeon: Levi Aland, MD;  Location: [facility];  Service: Gynecology;  Laterality: N/A;   left breast cyst removal  2000   LEFT HEART CATH AND CORONARY ANGIOGRAPHY N/A 10/31/2018   Procedure: LEFT HEART CATH AND CORONARY ANGIOGRAPHY;  Surgeon: Corky Crafts, MD;  Location: [facility] INVASIVE CV [facility];  Service: Cardiovascular;  Laterality: N/A;   TUBAL LIGATION      No current facility-administered medications for this encounter.   Current Outpatient Medications  Medication Sig Dispense Refill Last Dose   albuterol (VENTOLIN HFA) 108 (90 Base) MCG/ACT inhaler TAKE 2 PUFFS BY MOUTH EVERY 6 HOURS AS NEEDED FOR WHEEZE OR SHORTNESS OF BREATH 6.7 each 3    aspirin EC 81 MG tablet Take 81 mg by mouth daily. Swallow whole.      atorvastatin (LIPITOR) 40 MG tablet TAKE 1 TABLET BY MOUTH EVERY DAY 90 tablet 3    dexlansoprazole (DEXILANT) 60 MG capsule TAKE 1 CAPSULE BY MOUTH EVERY DAY 90 capsule 3    DULoxetine (CYMBALTA) 20 MG capsule TAKE 1 CAPSULE BY MOUTH EVERY DAY 90 capsule 1    furosemide (LASIX) 20 MG tablet TAKE 1 TABLET (20 MG TOTAL) BY MOUTH DAILY AS NEEDED FOR FLUID OR EDEMA. (Patient taking differently: Take 20 mg by mouth daily.) 90 tablet 3    gabapentin (NEURONTIN) 600 MG tablet TAKE 1 TABLET IN THE MORNING AND 2 TABLETS AT BEDTIME 270 tablet 2    ibuprofen (ADVIL) 200 MG tablet Take 200 mg by mouth every 6 (six) hours as needed for moderate pain.      insulin glargine (LANTUS SOLOSTAR) 100 UNIT/ML Solostar Pen Inject 30 Units into the skin at bedtime. 15 mL 3    insulin lispro (HUMALOG KWIKPEN) 100 UNIT/ML KwikPen Inject 5-9 Units into the skin 2 (two) times daily before a meal. (Patient taking differently: Inject 7-9 Units into the skin 2 (two) times daily before a meal.) 15 mL 1    ketoconazole (NIZORAL) 2 % cream APPLY TWICE A DAY FOR 2 WEEKS AS NEEDED FOR RASH 30 g 2    olmesartan (BENICAR) 40  MG tablet TAKE 1 TABLET BY MOUTH EVERY DAY 90 tablet 3    ondansetron (ZOFRAN ODT) 4 MG disintegrating tablet Take 1 tablet (4 mg total) by mouth every 8 (eight) hours as needed for nausea or vomiting. 30 tablet 0    polyethylene glycol powder ([employer] PURELAX) 17 GM/SCOOP powder DISSOLVE 1 CAPFUL IN AT LEAST 8 OUNCES WATER/JUICE AND DRINK TWICE DAILY (Patient taking differently: Take 17 g by mouth at bedtime.) 1020 g 5    sucralfate (CARAFATE) 1 GM/10ML suspension Take 10 mLs (1 g total) by mouth 4 (four) times daily. (Patient taking differently: Take 1 g by mouth daily as needed (nausea).) 420 mL 1    tiZANidine (ZANAFLEX) 2 MG tablet Take 1 tablet (2 mg total) by mouth at bedtime. 30 tablet 0    valACYclovir (VALTREX) 500 MG tablet TAKE 1 TABLET BY MOUTH EVERY DAY 90 tablet 3    Dulaglutide (TRULICITY) 0.75 MG/0.5ML SOPN Inject into the skin weekly 2 mL 0  glucose blood (ONETOUCH ULTRA) test strip USE 2 TIMES DAILY. AND LANCETS 2/DAY 200 strip 3    Insulin Pen Needle 32G X 4 MM MISC Use 3-4x a day 300 each 3    Insulin Syringe-Needle U-100 (BD INSULIN SYRINGE U/F) 31G X 5/16" 1 ML MISC USE UP TO 3 TIMES A DAY 100 each 5    Vitamin D, Ergocalciferol, (DRISDOL) 1.25 MG (50000 UNIT) CAPS capsule TAKE 1 CAPSULE (50,000 UNITS TOTAL) BY MOUTH EVERY 30 DAYS 3 capsule 0    Allergies  Allergen Reactions   Codeine Nausea And Vomiting   Gluten Meal Other (See Comments)    No wheat or soy   Metronidazole Nausea And Vomiting   Nsaids Nausea And Vomiting and Other (See Comments)    Cannot tolerate large doses d/t Celiac disease    Soy Allergy    Wheat     Social History   Tobacco Use   Smoking status: Never   Smokeless tobacco: Never  Substance Use Topics   Alcohol use: No    Family History  Problem Relation Age of Onset   Hypertension Mother    Colon cancer Sister        dx in her early 84s   Diabetes Sister    Endometrial cancer Sister    Hypertension Brother    Other Father        2  collapsed lungs   COPD Father    Diabetes Father    Heart attack Father    Heart failure Father    High blood pressure Father    Colon polyps Neg Hx    Esophageal cancer Neg Hx    Stomach cancer Neg Hx      Review of Systems  Objective:  Physical Exam Vitals reviewed.  Constitutional:      Appearance: Normal appearance. She is obese.  HENT:     Head: Normocephalic and atraumatic.  Eyes:     Extraocular Movements: Extraocular movements intact.     Pupils: Pupils are equal, round, and reactive to light.  Cardiovascular:     Rate and Rhythm: Normal rate.     Pulses: Normal pulses.  Pulmonary:     Effort: Pulmonary effort is normal.     Breath sounds: Normal breath sounds.  Abdominal:     Palpations: Abdomen is soft.  Musculoskeletal:     Cervical back: Normal range of motion and neck supple.     Right knee: Effusion, bony tenderness and crepitus present. Decreased range of motion. Tenderness present over the medial joint line and lateral joint line. Abnormal alignment.  Neurological:     Mental Status: She is alert and oriented to person, place, and time.  Psychiatric:        Behavior: Behavior normal.     Vital signs in last 24 hours:    Labs:   Estimated body mass index is 34.78 kg/m as calculated from the following:   Height as of 09/05/22: 5\' 5"  (1.651 m).   Weight as of 09/05/22: 94.8 kg.   Imaging Review Plain radiographs demonstrate severe degenerative joint disease of the right knee(s). The overall alignment ismild valgus. The bone quality appears to be good for age and reported activity level.      Assessment/Plan:  End stage arthritis, right knee   The patient history, physical examination, clinical judgment of the provider and imaging studies are consistent with end stage degenerative joint disease of the right knee(s) and total knee arthroplasty is deemed medically  necessary. The treatment options including medical management, injection therapy  arthroscopy and arthroplasty were discussed at length. The risks and benefits of total knee arthroplasty were presented and reviewed. The risks due to aseptic loosening, infection, stiffness, patella tracking problems, thromboembolic complications and other imponderables were discussed. The patient acknowledged the explanation, agreed to proceed with the plan and consent was signed. Patient is being admitted for inpatient treatment for surgery, pain control, PT, OT, prophylactic antibiotics, VTE prophylaxis, progressive ambulation and ADL's and discharge planning. The patient is planning to be discharged home with home health services

## 2022-09-11 NOTE — Telephone Encounter (Signed)
Patient has a question about medication  °Please call  °

## 2022-09-11 NOTE — Anesthesia Preprocedure Evaluation (Signed)
Anesthesia Evaluation  Patient identified by MRN, date of birth, ID band Patient awake    Reviewed: Allergy & Precautions, NPO status , Patient's Chart, lab work & pertinent test results  History of Anesthesia Complications (+) PONV and history of anesthetic complications  Airway Mallampati: II  TM Distance: >3 FB Neck ROM: Full    Dental  (+) Missing,    Pulmonary asthma , sleep apnea    Pulmonary exam normal        Cardiovascular hypertension, Pt. on medications Normal cardiovascular exam     Neuro/Psych  Headaches  Anxiety Depression       GI/Hepatic Neg liver ROS,GERD  Controlled,,  Endo/Other  diabetes (on Trulicity), Type 2, Insulin Dependent  BMI 35  Renal/GU negative Renal ROS     Musculoskeletal  (+) Arthritis ,    Abdominal   Peds  Hematology negative hematology ROS (+)   Anesthesia Other Findings Day of surgery medications reviewed with patient.  Reproductive/Obstetrics                              Anesthesia Physical Anesthesia Plan  ASA: 2  Anesthesia Plan: Spinal   Post-op Pain Management: Regional block* and Tylenol PO (pre-op)*   Induction:   PONV Risk Score and Plan: 4 or greater and Treatment may vary due to age or medical condition, Ondansetron, Propofol infusion, Dexamethasone and Midazolam  Airway Management Planned: Natural Airway and Simple Face Mask  Additional Equipment: None  Intra-op Plan:   Post-operative Plan:   Informed Consent: I have reviewed the patients History and Physical, chart, labs and discussed the procedure including the risks, benefits and alternatives for the proposed anesthesia with the patient or authorized representative who has indicated his/her understanding and acceptance.       Plan Discussed with: CRNA  Anesthesia Plan Comments:          Anesthesia Quick Evaluation

## 2022-09-12 ENCOUNTER — Encounter (HOSPITAL_COMMUNITY)
Admission: AD | Disposition: A | Discharge status: Skilled Nursing Facility | Payer: Self-pay | Source: Home / Self Care | Attending: Orthopaedic Surgery

## 2022-09-12 ENCOUNTER — Ambulatory Visit (HOSPITAL_BASED_OUTPATIENT_CLINIC_OR_DEPARTMENT_OTHER): Payer: Medicare Other | Admitting: Anesthesiology

## 2022-09-12 ENCOUNTER — Other Ambulatory Visit: Payer: Self-pay

## 2022-09-12 ENCOUNTER — Encounter (HOSPITAL_COMMUNITY): Payer: Self-pay | Admitting: Orthopaedic Surgery

## 2022-09-12 ENCOUNTER — Ambulatory Visit (HOSPITAL_COMMUNITY): Payer: Medicare Other | Admitting: Anesthesiology

## 2022-09-12 ENCOUNTER — Inpatient Hospital Stay (HOSPITAL_COMMUNITY)
Admission: AD | Admit: 2022-09-12 | Discharge: 2022-09-20 | DRG: 470 | Disposition: A | Discharge status: Skilled Nursing Facility | Payer: Medicare Other | Attending: Orthopaedic Surgery | Admitting: Orthopaedic Surgery

## 2022-09-12 ENCOUNTER — Observation Stay (HOSPITAL_COMMUNITY): Payer: Medicare Other

## 2022-09-12 DIAGNOSIS — I10 Essential (primary) hypertension: Secondary | ICD-10-CM

## 2022-09-12 DIAGNOSIS — Z885 Allergy status to narcotic agent status: Secondary | ICD-10-CM

## 2022-09-12 DIAGNOSIS — M1711 Unilateral primary osteoarthritis, right knee: Secondary | ICD-10-CM | POA: Diagnosis present

## 2022-09-12 DIAGNOSIS — Z96651 Presence of right artificial knee joint: Principal | ICD-10-CM

## 2022-09-12 DIAGNOSIS — Z6837 Body mass index (BMI) 37.0-37.9, adult: Secondary | ICD-10-CM

## 2022-09-12 DIAGNOSIS — K900 Celiac disease: Secondary | ICD-10-CM | POA: Diagnosis present

## 2022-09-12 DIAGNOSIS — Z8249 Family history of ischemic heart disease and other diseases of the circulatory system: Secondary | ICD-10-CM

## 2022-09-12 DIAGNOSIS — Z7985 Long-term (current) use of injectable non-insulin antidiabetic drugs: Secondary | ICD-10-CM

## 2022-09-12 DIAGNOSIS — E119 Type 2 diabetes mellitus without complications: Secondary | ICD-10-CM

## 2022-09-12 DIAGNOSIS — K589 Irritable bowel syndrome without diarrhea: Secondary | ICD-10-CM | POA: Diagnosis present

## 2022-09-12 DIAGNOSIS — M8580 Other specified disorders of bone density and structure, unspecified site: Secondary | ICD-10-CM | POA: Diagnosis present

## 2022-09-12 DIAGNOSIS — E1169 Type 2 diabetes mellitus with other specified complication: Secondary | ICD-10-CM | POA: Diagnosis present

## 2022-09-12 DIAGNOSIS — E1142 Type 2 diabetes mellitus with diabetic polyneuropathy: Secondary | ICD-10-CM | POA: Diagnosis present

## 2022-09-12 DIAGNOSIS — E669 Obesity, unspecified: Secondary | ICD-10-CM | POA: Diagnosis present

## 2022-09-12 DIAGNOSIS — E782 Mixed hyperlipidemia: Secondary | ICD-10-CM | POA: Diagnosis present

## 2022-09-12 DIAGNOSIS — Z9842 Cataract extraction status, left eye: Secondary | ICD-10-CM

## 2022-09-12 DIAGNOSIS — Z7982 Long term (current) use of aspirin: Secondary | ICD-10-CM

## 2022-09-12 DIAGNOSIS — Z9049 Acquired absence of other specified parts of digestive tract: Secondary | ICD-10-CM

## 2022-09-12 DIAGNOSIS — Z8719 Personal history of other diseases of the digestive system: Secondary | ICD-10-CM

## 2022-09-12 DIAGNOSIS — Z96659 Presence of unspecified artificial knee joint: Secondary | ICD-10-CM

## 2022-09-12 DIAGNOSIS — K219 Gastro-esophageal reflux disease without esophagitis: Secondary | ICD-10-CM | POA: Diagnosis present

## 2022-09-12 DIAGNOSIS — G4733 Obstructive sleep apnea (adult) (pediatric): Secondary | ICD-10-CM | POA: Diagnosis present

## 2022-09-12 DIAGNOSIS — Z794 Long term (current) use of insulin: Secondary | ICD-10-CM

## 2022-09-12 DIAGNOSIS — Z87442 Personal history of urinary calculi: Secondary | ICD-10-CM

## 2022-09-12 DIAGNOSIS — Z91018 Allergy to other foods: Secondary | ICD-10-CM

## 2022-09-12 DIAGNOSIS — Z9851 Tubal ligation status: Secondary | ICD-10-CM

## 2022-09-12 DIAGNOSIS — Z9841 Cataract extraction status, right eye: Secondary | ICD-10-CM

## 2022-09-12 DIAGNOSIS — J45909 Unspecified asthma, uncomplicated: Secondary | ICD-10-CM

## 2022-09-12 DIAGNOSIS — K760 Fatty (change of) liver, not elsewhere classified: Secondary | ICD-10-CM | POA: Diagnosis present

## 2022-09-12 DIAGNOSIS — Z886 Allergy status to analgesic agent status: Secondary | ICD-10-CM

## 2022-09-12 DIAGNOSIS — Z79899 Other long term (current) drug therapy: Secondary | ICD-10-CM

## 2022-09-12 DIAGNOSIS — Z833 Family history of diabetes mellitus: Secondary | ICD-10-CM

## 2022-09-12 HISTORY — PX: TOTAL KNEE ARTHROPLASTY: SHX125

## 2022-09-12 LAB — GLUCOSE, CAPILLARY
Glucose-Capillary: 145 mg/dL — ABNORMAL HIGH (ref 70–99)
Glucose-Capillary: 116 mg/dL — ABNORMAL HIGH (ref 70–99)
Glucose-Capillary: 148 mg/dL — ABNORMAL HIGH (ref 70–99)
Glucose-Capillary: 223 mg/dL — ABNORMAL HIGH (ref 70–99)
Glucose-Capillary: 204 mg/dL — ABNORMAL HIGH (ref 70–99)

## 2022-09-12 LAB — ABO/RH: ABO/RH(D): B POS

## 2022-09-12 SURGERY — ARTHROPLASTY, KNEE, TOTAL
Anesthesia: Spinal | Site: Knee | Laterality: Right

## 2022-09-12 MED ORDER — IRBESARTAN 300 MG PO TABS
300.0000 mg | ORAL_TABLET | Freq: Every day | ORAL | Status: DC
  Administered 2022-09-12 – 2022-09-20 (×9): 300 mg via ORAL
  Filled 2022-09-12: qty 2
  Filled 2022-09-12 (×8): qty 1

## 2022-09-12 MED ORDER — POVIDONE-IODINE 10 % EX SWAB
2.0000 | Freq: Once | CUTANEOUS | Status: AC
  Administered 2022-09-12: 2 via TOPICAL

## 2022-09-12 MED ORDER — EPHEDRINE SULFATE-NACL 50-0.9 MG/10ML-% IV SOSY
PREFILLED_SYRINGE | INTRAVENOUS | Status: DC | PRN
  Administered 2022-09-12: 5 mg via INTRAVENOUS
  Administered 2022-09-12: 7.5 mg via INTRAVENOUS

## 2022-09-12 MED ORDER — PROPOFOL 500 MG/50ML IV EMUL
INTRAVENOUS | Status: DC | PRN
  Administered 2022-09-12: 20 mg via INTRAVENOUS
  Administered 2022-09-12: 50 ug/kg/min via INTRAVENOUS

## 2022-09-12 MED ORDER — METOCLOPRAMIDE HCL 5 MG/ML IJ SOLN
5.0000 mg | Freq: Three times a day (TID) | INTRAMUSCULAR | Status: DC | PRN
  Administered 2022-09-12 – 2022-09-13 (×2): 10 mg via INTRAVENOUS
  Filled 2022-09-12 (×2): qty 2

## 2022-09-12 MED ORDER — DOCUSATE SODIUM 100 MG PO CAPS
100.0000 mg | ORAL_CAPSULE | Freq: Two times a day (BID) | ORAL | Status: DC
  Administered 2022-09-12 – 2022-09-20 (×15): 100 mg via ORAL
  Filled 2022-09-12 (×17): qty 1

## 2022-09-12 MED ORDER — OXYCODONE HCL 5 MG PO TABS
10.0000 mg | ORAL_TABLET | ORAL | Status: DC | PRN
  Administered 2022-09-12: 10 mg via ORAL
  Administered 2022-09-15 – 2022-09-16 (×3): 15 mg via ORAL
  Administered 2022-09-17 – 2022-09-19 (×3): 10 mg via ORAL
  Administered 2022-09-19: 15 mg via ORAL
  Administered 2022-09-19: 10 mg via ORAL
  Filled 2022-09-12: qty 3
  Filled 2022-09-12: qty 2
  Filled 2022-09-12 (×5): qty 3

## 2022-09-12 MED ORDER — DIPHENHYDRAMINE HCL 12.5 MG/5ML PO ELIX: 12.5000 mg | ORAL_SOLUTION | ORAL | Status: DC | PRN

## 2022-09-12 MED ORDER — GABAPENTIN 300 MG PO CAPS
600.0000 mg | ORAL_CAPSULE | Freq: Every day | ORAL | Status: DC
  Administered 2022-09-12 – 2022-09-19 (×8): 600 mg via ORAL
  Filled 2022-09-12 (×5): qty 2
  Filled 2022-09-12: qty 6
  Filled 2022-09-12 (×2): qty 2

## 2022-09-12 MED ORDER — BUPIVACAINE-EPINEPHRINE (PF) 0.5% -1:200000 IJ SOLN
INTRAMUSCULAR | Status: DC | PRN
  Administered 2022-09-12: 15 mL via PERINEURAL

## 2022-09-12 MED ORDER — CEFAZOLIN SODIUM-DEXTROSE 2-4 GM/100ML-% IV SOLN
2.0000 g | INTRAVENOUS | Status: AC
  Administered 2022-09-12: 2 g via INTRAVENOUS
  Filled 2022-09-12: qty 100

## 2022-09-12 MED ORDER — ACETAMINOPHEN 500 MG PO TABS
1000.0000 mg | ORAL_TABLET | Freq: Once | ORAL | Status: AC
  Administered 2022-09-12: 1000 mg via ORAL
  Filled 2022-09-12: qty 2

## 2022-09-12 MED ORDER — OXYCODONE HCL 5 MG PO TABS: 5.0000 mg | ORAL_TABLET | Freq: Once | ORAL | Status: DC | PRN

## 2022-09-12 MED ORDER — BUPIVACAINE-EPINEPHRINE 0.25% -1:200000 IJ SOLN
INTRAMUSCULAR | Status: DC | PRN
  Administered 2022-09-12: 20 mL

## 2022-09-12 MED ORDER — APIXABAN 2.5 MG PO TABS
2.5000 mg | ORAL_TABLET | Freq: Two times a day (BID) | ORAL | Status: DC
  Administered 2022-09-13 – 2022-09-20 (×15): 2.5 mg via ORAL
  Filled 2022-09-12 (×15): qty 1

## 2022-09-12 MED ORDER — DEXAMETHASONE SODIUM PHOSPHATE 10 MG/ML IJ SOLN
INTRAMUSCULAR | Status: DC | PRN
  Administered 2022-09-12: 5 mg via INTRAVENOUS

## 2022-09-12 MED ORDER — FENTANYL CITRATE (PF) 250 MCG/5ML IJ SOLN
INTRAMUSCULAR | Status: DC | PRN
  Administered 2022-09-12 (×2): 25 ug via INTRAVENOUS
  Administered 2022-09-12: 50 ug via INTRAVENOUS

## 2022-09-12 MED ORDER — MENTHOL 3 MG MT LOZG: 1.0000 | LOZENGE | OROMUCOSAL | Status: DC | PRN

## 2022-09-12 MED ORDER — ONDANSETRON HCL 4 MG/2ML IJ SOLN
INTRAMUSCULAR | Status: DC | PRN
  Administered 2022-09-12: 4 mg via INTRAVENOUS

## 2022-09-12 MED ORDER — 0.9 % SODIUM CHLORIDE (POUR BTL) OPTIME
TOPICAL | Status: DC | PRN
  Administered 2022-09-12: 1000 mL

## 2022-09-12 MED ORDER — INSULIN ASPART 100 UNIT/ML IJ SOLN: 0.0000 [IU] | INTRAMUSCULAR | Status: DC | PRN

## 2022-09-12 MED ORDER — INSULIN ASPART 100 UNIT/ML IJ SOLN
0.0000 [IU] | Freq: Three times a day (TID) | INTRAMUSCULAR | Status: DC
  Administered 2022-09-12 – 2022-09-13 (×3): 5 [IU] via SUBCUTANEOUS
  Administered 2022-09-13 – 2022-09-14 (×2): 8 [IU] via SUBCUTANEOUS
  Administered 2022-09-14: 5 [IU] via SUBCUTANEOUS
  Administered 2022-09-14: 3 [IU] via SUBCUTANEOUS
  Administered 2022-09-15: 11 [IU] via SUBCUTANEOUS
  Administered 2022-09-15 (×2): 2 [IU] via SUBCUTANEOUS
  Administered 2022-09-16: 3 [IU] via SUBCUTANEOUS
  Administered 2022-09-16: 8 [IU] via SUBCUTANEOUS
  Administered 2022-09-16: 5 [IU] via SUBCUTANEOUS
  Administered 2022-09-17: 3 [IU] via SUBCUTANEOUS
  Administered 2022-09-17: 2 [IU] via SUBCUTANEOUS
  Administered 2022-09-17: 8 [IU] via SUBCUTANEOUS
  Administered 2022-09-18 (×3): 5 [IU] via SUBCUTANEOUS
  Administered 2022-09-19: 8 [IU] via SUBCUTANEOUS

## 2022-09-12 MED ORDER — TRANEXAMIC ACID-NACL 1000-0.7 MG/100ML-% IV SOLN
1000.0000 mg | INTRAVENOUS | Status: AC
  Administered 2022-09-12: 1000 mg via INTRAVENOUS
  Filled 2022-09-12: qty 100

## 2022-09-12 MED ORDER — METOCLOPRAMIDE HCL 5 MG PO TABS: 5.0000 mg | ORAL_TABLET | Freq: Three times a day (TID) | ORAL | Status: DC | PRN

## 2022-09-12 MED ORDER — DULOXETINE HCL 20 MG PO CPEP: 20.0000 mg | ORAL_CAPSULE | Freq: Every day | ORAL | Status: DC

## 2022-09-12 MED ORDER — LACTATED RINGERS IV SOLN: INTRAVENOUS | Status: DC

## 2022-09-12 MED ORDER — PHENYLEPHRINE HCL-NACL 20-0.9 MG/250ML-% IV SOLN
INTRAVENOUS | Status: DC | PRN
  Administered 2022-09-12: 50 ug/min via INTRAVENOUS

## 2022-09-12 MED ORDER — MIDAZOLAM HCL 2 MG/2ML IJ SOLN
INTRAMUSCULAR | Status: AC
  Filled 2022-09-12: qty 2

## 2022-09-12 MED ORDER — OXYCODONE HCL 5 MG/5ML PO SOLN: 5.0000 mg | Freq: Once | ORAL | Status: DC | PRN

## 2022-09-12 MED ORDER — SODIUM CHLORIDE 0.9 % IR SOLN
Status: DC | PRN
  Administered 2022-09-12: 1000 mL

## 2022-09-12 MED ORDER — POLYETHYLENE GLYCOL 3350 17 G PO PACK: 17.0000 g | PACK | Freq: Every day | ORAL | Status: DC | PRN

## 2022-09-12 MED ORDER — CHLORHEXIDINE GLUCONATE 0.12 % MT SOLN
15.0000 mL | Freq: Once | OROMUCOSAL | Status: AC
  Administered 2022-09-12: 15 mL via OROMUCOSAL
  Filled 2022-09-12: qty 15

## 2022-09-12 MED ORDER — ALBUMIN HUMAN 5 % IV SOLN: INTRAVENOUS | Status: DC | PRN

## 2022-09-12 MED ORDER — PANTOPRAZOLE SODIUM 40 MG PO TBEC
40.0000 mg | DELAYED_RELEASE_TABLET | Freq: Every day | ORAL | Status: DC
  Administered 2022-09-12 – 2022-09-20 (×9): 40 mg via ORAL
  Filled 2022-09-12 (×9): qty 1

## 2022-09-12 MED ORDER — ONDANSETRON HCL 4 MG PO TABS: 4.0000 mg | ORAL_TABLET | Freq: Four times a day (QID) | ORAL | Status: DC | PRN

## 2022-09-12 MED ORDER — METHOCARBAMOL 500 MG PO TABS
500.0000 mg | ORAL_TABLET | Freq: Four times a day (QID) | ORAL | Status: DC | PRN
  Administered 2022-09-12 – 2022-09-20 (×10): 500 mg via ORAL
  Filled 2022-09-12 (×11): qty 1

## 2022-09-12 MED ORDER — CEFAZOLIN SODIUM-DEXTROSE 1-4 GM/50ML-% IV SOLN
1.0000 g | Freq: Four times a day (QID) | INTRAVENOUS | Status: AC
  Administered 2022-09-12 (×2): 1 g via INTRAVENOUS
  Filled 2022-09-12 (×2): qty 50

## 2022-09-12 MED ORDER — AMISULPRIDE (ANTIEMETIC) 5 MG/2ML IV SOLN: 10.0000 mg | Freq: Once | INTRAVENOUS | Status: DC | PRN

## 2022-09-12 MED ORDER — OXYCODONE HCL 5 MG PO TABS
5.0000 mg | ORAL_TABLET | ORAL | Status: DC | PRN
  Administered 2022-09-13 (×2): 10 mg via ORAL
  Administered 2022-09-14: 5 mg via ORAL
  Administered 2022-09-14 – 2022-09-20 (×15): 10 mg via ORAL
  Filled 2022-09-12 (×15): qty 2
  Filled 2022-09-12: qty 1
  Filled 2022-09-12 (×6): qty 2

## 2022-09-12 MED ORDER — FUROSEMIDE 20 MG PO TABS
20.0000 mg | ORAL_TABLET | Freq: Every day | ORAL | Status: DC
  Administered 2022-09-12 – 2022-09-20 (×9): 20 mg via ORAL
  Filled 2022-09-12 (×9): qty 1

## 2022-09-12 MED ORDER — FENTANYL CITRATE (PF) 250 MCG/5ML IJ SOLN
INTRAMUSCULAR | Status: AC
  Filled 2022-09-12: qty 5

## 2022-09-12 MED ORDER — VALACYCLOVIR HCL 500 MG PO TABS
500.0000 mg | ORAL_TABLET | Freq: Every day | ORAL | Status: DC
  Administered 2022-09-13 – 2022-09-20 (×8): 500 mg via ORAL
  Filled 2022-09-12 (×8): qty 1

## 2022-09-12 MED ORDER — MIDAZOLAM HCL 2 MG/2ML IJ SOLN
INTRAMUSCULAR | Status: DC | PRN
  Administered 2022-09-12: 1 mg via INTRAVENOUS

## 2022-09-12 MED ORDER — ONDANSETRON HCL 4 MG/2ML IJ SOLN
4.0000 mg | Freq: Four times a day (QID) | INTRAMUSCULAR | Status: DC | PRN
  Administered 2022-09-12 – 2022-09-19 (×3): 4 mg via INTRAVENOUS
  Filled 2022-09-12 (×3): qty 2

## 2022-09-12 MED ORDER — FENTANYL CITRATE (PF) 100 MCG/2ML IJ SOLN: 25.0000 ug | INTRAMUSCULAR | Status: DC | PRN

## 2022-09-12 MED ORDER — PHENOL 1.4 % MT LIQD: 1.0000 | OROMUCOSAL | Status: DC | PRN

## 2022-09-12 MED ORDER — ORAL CARE MOUTH RINSE: 15.0000 mL | Freq: Once | OROMUCOSAL | Status: AC

## 2022-09-12 MED ORDER — ACETAMINOPHEN 325 MG PO TABS: 325.0000 mg | ORAL_TABLET | Freq: Four times a day (QID) | ORAL | Status: DC | PRN

## 2022-09-12 MED ORDER — INSULIN GLARGINE-YFGN 100 UNIT/ML ~~LOC~~ SOLN
15.0000 [IU] | Freq: Every day | SUBCUTANEOUS | Status: DC
  Administered 2022-09-12 – 2022-09-18 (×7): 15 [IU] via SUBCUTANEOUS
  Filled 2022-09-12 (×9): qty 0.15

## 2022-09-12 MED ORDER — BUPIVACAINE-EPINEPHRINE (PF) 0.25% -1:200000 IJ SOLN
INTRAMUSCULAR | Status: AC
  Filled 2022-09-12: qty 30

## 2022-09-12 MED ORDER — ATORVASTATIN CALCIUM 40 MG PO TABS
40.0000 mg | ORAL_TABLET | Freq: Every day | ORAL | Status: DC
  Administered 2022-09-13 – 2022-09-20 (×8): 40 mg via ORAL
  Filled 2022-09-12: qty 4
  Filled 2022-09-12 (×7): qty 1

## 2022-09-12 MED ORDER — ALUM & MAG HYDROXIDE-SIMETH 200-200-20 MG/5ML PO SUSP
30.0000 mL | ORAL | Status: DC | PRN
  Administered 2022-09-16: 30 mL via ORAL
  Filled 2022-09-12: qty 30

## 2022-09-12 MED ORDER — METHOCARBAMOL 1000 MG/10ML IJ SOLN: 500.0000 mg | Freq: Four times a day (QID) | INTRAVENOUS | Status: DC | PRN

## 2022-09-12 MED ORDER — BUPIVACAINE IN DEXTROSE 0.75-8.25 % IT SOLN
INTRATHECAL | Status: DC | PRN
  Administered 2022-09-12: 1.6 mL via INTRATHECAL

## 2022-09-12 MED ORDER — SODIUM CHLORIDE 0.9 % IV SOLN: INTRAVENOUS | Status: DC

## 2022-09-12 MED ORDER — HYDROMORPHONE HCL 1 MG/ML IJ SOLN
0.5000 mg | INTRAMUSCULAR | Status: DC | PRN
  Administered 2022-09-12 – 2022-09-20 (×19): 1 mg via INTRAVENOUS
  Filled 2022-09-12 (×19): qty 1

## 2022-09-12 MED ORDER — INSULIN ASPART 100 UNIT/ML IJ SOLN
6.0000 [IU] | Freq: Three times a day (TID) | INTRAMUSCULAR | Status: DC
  Administered 2022-09-12 – 2022-09-20 (×24): 6 [IU] via SUBCUTANEOUS

## 2022-09-12 SURGICAL SUPPLY — 74 items
BAG COUNTER SPONGE SURGICOUNT (BAG) ×1 IMPLANT
BAG SPNG CNTER NS LX DISP (BAG) ×1
BANDAGE ESMARK 6X9 LF (GAUZE/BANDAGES/DRESSINGS) ×1 IMPLANT
BLADE SAG 18X100X1.27 (BLADE) ×1 IMPLANT
BLADE SAW SGTL 11.0X1.19X90.0M (BLADE) IMPLANT
BNDG CMPR 9X6 STRL LF SNTH (GAUZE/BANDAGES/DRESSINGS) ×1
BNDG CMPR MED 10X6 ELC LF (GAUZE/BANDAGES/DRESSINGS) ×1
BNDG ELASTIC 6X10 VLCR STRL LF (GAUZE/BANDAGES/DRESSINGS) IMPLANT
BNDG ELASTIC 6X5.8 VLCR STR LF (GAUZE/BANDAGES/DRESSINGS) ×2 IMPLANT
BNDG ESMARK 6X9 LF (GAUZE/BANDAGES/DRESSINGS) ×1
BOWL SMART MIX CTS (DISPOSABLE) IMPLANT
CEMENT BONE R 1X40 (Cement) IMPLANT
COMP TIB PS KNEE E 0D RT (Joint) ×1 IMPLANT
COMPONET TIB PS KNEE E 0D RT (Joint) IMPLANT
COOLER ICEMAN CLASSIC (MISCELLANEOUS) ×1 IMPLANT
COVER SURGICAL LIGHT HANDLE (MISCELLANEOUS) ×1 IMPLANT
CUFF TOURN SGL QUICK 34 (TOURNIQUET CUFF) ×1
CUFF TOURN SGL QUICK 42 (TOURNIQUET CUFF) IMPLANT
CUFF TRNQT CYL 34X4.125X (TOURNIQUET CUFF) ×1 IMPLANT
DRAPE EXTREMITY T 121X128X90 (DISPOSABLE) ×1 IMPLANT
DRAPE HALF SHEET 40X57 (DRAPES) ×1 IMPLANT
DRAPE U-SHAPE 47X51 STRL (DRAPES) ×1 IMPLANT
DURAPREP 26ML APPLICATOR (WOUND CARE) ×1 IMPLANT
ELECT CAUTERY BLADE 6.4 (BLADE) ×1 IMPLANT
ELECT REM PT RETURN 9FT ADLT (ELECTROSURGICAL) ×1
ELECTRODE REM PT RTRN 9FT ADLT (ELECTROSURGICAL) ×1 IMPLANT
FACESHIELD WRAPAROUND (MASK) ×2 IMPLANT
FACESHIELD WRAPAROUND OR TEAM (MASK) ×2 IMPLANT
FEMUR CMT CR STD SZ 6 RT KNEE (Joint) ×1 IMPLANT
FEMUR CMTD CR STD SZ 6 RT KNEE (Joint) IMPLANT
GAUZE PAD ABD 8X10 STRL (GAUZE/BANDAGES/DRESSINGS) ×1 IMPLANT
GAUZE SPONGE 4X4 12PLY STRL (GAUZE/BANDAGES/DRESSINGS) ×1 IMPLANT
GAUZE XEROFORM 1X8 LF (GAUZE/BANDAGES/DRESSINGS) ×1 IMPLANT
GLOVE BIOGEL PI IND STRL 8 (GLOVE) ×2 IMPLANT
GLOVE ORTHO TXT STRL SZ7.5 (GLOVE) ×1 IMPLANT
GLOVE SURG ORTHO 8.0 STRL STRW (GLOVE) ×1 IMPLANT
GOWN STRL REUS W/ TWL LRG LVL3 (GOWN DISPOSABLE) IMPLANT
GOWN STRL REUS W/ TWL XL LVL3 (GOWN DISPOSABLE) ×2 IMPLANT
GOWN STRL REUS W/TWL LRG LVL3 (GOWN DISPOSABLE)
GOWN STRL REUS W/TWL XL LVL3 (GOWN DISPOSABLE) ×2
HANDPIECE INTERPULSE COAX TIP (DISPOSABLE) ×1
HDLS TROCR DRIL PIN KNEE 75 (PIN) ×1
IMMOBILIZER KNEE 22 UNIV (SOFTGOODS) ×1 IMPLANT
INSERT TIB AS PERS SZ 6-7 14 (Insert) IMPLANT
IV NS 1000ML (IV SOLUTION) ×1
IV NS 1000ML BAXH (IV SOLUTION) ×1 IMPLANT
KIT BASIN OR (CUSTOM PROCEDURE TRAY) ×1 IMPLANT
KIT TURNOVER KIT B (KITS) ×1 IMPLANT
MANIFOLD NEPTUNE II (INSTRUMENTS) ×1 IMPLANT
NDL 18GX1X1/2 (RX/OR ONLY) (NEEDLE) IMPLANT
NEEDLE 18GX1X1/2 (RX/OR ONLY) (NEEDLE) IMPLANT
NS IRRIG 1000ML POUR BTL (IV SOLUTION) ×1 IMPLANT
PACK TOTAL JOINT (CUSTOM PROCEDURE TRAY) ×1 IMPLANT
PAD ARMBOARD 7.5X6 YLW CONV (MISCELLANEOUS) ×1 IMPLANT
PAD COLD SHLDR WRAP-ON (PAD) ×1 IMPLANT
PADDING CAST COTTON 6X4 STRL (CAST SUPPLIES) ×1 IMPLANT
PIN DRILL HDLS TROCAR 75 4PK (PIN) IMPLANT
SCREW FEMALE HEX FIX 25X2.5 (ORTHOPEDIC DISPOSABLE SUPPLIES) IMPLANT
SET HNDPC FAN SPRY TIP SCT (DISPOSABLE) ×1 IMPLANT
SET PAD KNEE POSITIONER (MISCELLANEOUS) ×1 IMPLANT
STAPLER VISISTAT 35W (STAPLE) ×1 IMPLANT
STEM POLY PAT PLY 29M KNEE (Knees) IMPLANT
SUCTION FRAZIER HANDLE 10FR (MISCELLANEOUS) ×1
SUCTION TUBE FRAZIER 10FR DISP (MISCELLANEOUS) ×1 IMPLANT
SUT VIC AB 0 CT1 27 (SUTURE) ×2
SUT VIC AB 0 CT1 27XBRD ANBCTR (SUTURE) ×1 IMPLANT
SUT VIC AB 1 CT1 27 (SUTURE) ×3
SUT VIC AB 1 CT1 27XBRD ANBCTR (SUTURE) ×2 IMPLANT
SUT VIC AB 2-0 CT1 27 (SUTURE) ×2
SUT VIC AB 2-0 CT1 TAPERPNT 27 (SUTURE) ×2 IMPLANT
SYR 50ML LL SCALE MARK (SYRINGE) IMPLANT
TOWEL GREEN STERILE (TOWEL DISPOSABLE) ×1 IMPLANT
TOWEL GREEN STERILE FF (TOWEL DISPOSABLE) ×1 IMPLANT
TRAY CATH INTERMITTENT SS 16FR (CATHETERS) IMPLANT

## 2022-09-12 NOTE — Anesthesia Procedure Notes (Signed)
Spinal  Patient location during procedure: OR Start time: 09/12/2022 7:31 AM End time: 09/12/2022 7:35 AM Reason for block: surgical anesthesia Staffing Performed: anesthesiologist  Anesthesiologist: Kaylyn Layer, MD Performed by: Kaylyn Layer, MD Authorized by: Kaylyn Layer, MD   Preanesthetic Checklist Completed: patient identified, IV checked, risks and benefits discussed, surgical consent, monitors and equipment checked, pre-op evaluation and timeout performed Spinal Block Patient position: sitting Prep: DuraPrep and site prepped and draped Patient monitoring: continuous pulse ox, blood pressure and heart rate Approach: midline Location: L3-4 Injection technique: single-shot Needle Needle type: Pencan  Needle gauge: 24 G Needle length: 9 cm Assessment Events: CSF return Additional Notes Risks, benefits, and alternative discussed. Patient gave consent to procedure. Prepped and draped in sitting position. Patient sedated but responsive to voice. Clear CSF obtained after 2 attempts, first at L3-4, second at L2-3. Positive terminal aspiration. No pain or paraesthesias with injection. Patient tolerated procedure well. Vital signs stable. Amalia Greenhouse, MD

## 2022-09-12 NOTE — Anesthesia Postprocedure Evaluation (Signed)
Anesthesia Post Note  Patient: Jessica [name]  Procedure(s) Performed: RIGHT TOTAL KNEE ARTHROPLASTY (Right: Knee)     Patient location during evaluation: PACU Anesthesia Type: Spinal Level of consciousness: awake and alert Pain management: pain level controlled Vital Signs Assessment: post-procedure vital signs reviewed and stable Respiratory status: spontaneous breathing, nonlabored ventilation and respiratory function stable Cardiovascular status: blood pressure returned to baseline Postop Assessment: no apparent nausea or vomiting and spinal receding Anesthetic complications: no   No notable events documented.  Last Vitals:  Vitals:   09/12/22 1145 09/12/22 1156  BP:  (!) 167/66  Pulse: 66 62  Resp: 13 18  Temp:  36.4 C  SpO2: 100% 100%                Shanda Howells

## 2022-09-12 NOTE — Telephone Encounter (Signed)
LMOM for patient that I was returning her call  If she still has questions to call back and leave a detailed message

## 2022-09-12 NOTE — Anesthesia Procedure Notes (Signed)
Anesthesia Regional Block: Adductor canal block   Pre-Anesthetic Checklist: , timeout performed,  Correct Patient, Correct Site, Correct Laterality,  Correct Procedure, Correct Position, site marked,  Risks and benefits discussed,  Pre-op evaluation,  At surgeon's request and post-op pain management  Laterality: Right  Prep: Maximum Sterile Barrier Precautions used, chloraprep       Needles:  Injection technique: Single-shot  Needle Type: Echogenic Stimulator Needle     Needle Length: 9cm  Needle Gauge: 22     Additional Needles:   Procedures:,,,, ultrasound used (permanent image in chart),,    Narrative:  Start time: 09/12/2022 7:04 AM End time: 09/12/2022 7:07 AM Injection made incrementally with aspirations every 5 mL.  Performed by: Personally  Anesthesiologist: Kaylyn Layer, MD  Additional Notes: Risks, benefits, and alternative discussed. Patient gave consent for procedure. Patient prepped and draped in sterile fashion. Sedation administered, patient remains easily responsive to voice. Relevant anatomy identified with ultrasound guidance. Local anesthetic given in 5cc increments with no signs or symptoms of intravascular injection. No pain or paraesthesias with injection. Patient monitored throughout procedure with signs of LAST or immediate complications. Tolerated well. Ultrasound image placed in chart.  Amalia Greenhouse, MD

## 2022-09-12 NOTE — Interval H&P Note (Signed)
History and Physical Interval Note: The patient understands that she is here today for a right total knee replacement to treat her severe right knee arthritis.  There has been no acute or interval change in her medical status.  See H&P.  The risks and benefits of surgery have been described in detail and informed consent has been obtained.  The right operative knee has been marked.  09/12/2022 7:12 AM  Jessica [name]  has presented today for surgery, with the diagnosis of osteoarthritis right knee.  The various methods of treatment have been discussed with the patient and family. After consideration of risks, benefits and other options for treatment, the patient has consented to  Procedure(s): RIGHT TOTAL KNEE ARTHROPLASTY (Right) as a surgical intervention.  The patient's history has been reviewed, patient examined, no change in status, stable for surgery.  I have reviewed the patient's chart and labs.  Questions were answered to the patient's satisfaction.     Kathryne Hitch

## 2022-09-12 NOTE — Discharge Instructions (Signed)
Information on my medicine - ELIQUIS (apixaban)  This medication education was reviewed with me or my healthcare representative as part of my discharge preparation. Why was Eliquis prescribed for you? Eliquis was prescribed for you to reduce the risk of blood clots forming after orthopedic surgery.    What do You need to know about Eliquis? Take your Eliquis TWICE DAILY - one tablet in the morning and one tablet in the evening with or without food.  It would be best to take the dose about the same time each day.  If you have difficulty swallowing the tablet whole please discuss with your pharmacist how to take the medication safely.  Take Eliquis exactly as prescribed by your doctor and DO NOT stop taking Eliquis without talking to the doctor who prescribed the medication.  Stopping without other medication to take the place of Eliquis may increase your risk of developing a clot.  After discharge, you should have regular check-up appointments with your healthcare provider that is prescribing your Eliquis.  What do you do if you miss a dose? If a dose of ELIQUIS is not taken at the scheduled time, take it as soon as possible on the same day and twice-daily administration should be resumed.  The dose should not be doubled to make up for a missed dose.  Do not take more than one tablet of ELIQUIS at the same time.  Important Safety Information A possible side effect of Eliquis is bleeding. You should call your healthcare provider right away if you experience any of the following: Bleeding from an injury or your nose that does not stop. Unusual colored urine (red or dark brown) or unusual colored stools (red or black). Unusual bruising for unknown reasons. A serious fall or if you hit your head (even if there is no bleeding).  Some medicines may interact with Eliquis and might increase your risk of bleeding or clotting while on Eliquis. To help avoid this, consult your healthcare  provider or pharmacist prior to using any new prescription or non-prescription medications, including herbals, vitamins, non-steroidal anti-inflammatory drugs (NSAIDs) and supplements.  This website has more information on Eliquis (apixaban): [email or web address].[email or web address]/[email or web address] AFTER JOINT REPLACEMENT   Remove items at home which could result in a fall. This includes throw rugs or furniture in walking pathways ICE to the affected joint every three hours while awake for 30 minutes at a time, for at least the first 3-5 days, and then as needed for pain and swelling.  Continue to use ice for pain and swelling. You may notice swelling that will progress down to the foot and ankle.  This is normal after surgery.  Elevate your leg when you are not up walking on it.   Continue to use the breathing machine you got in the hospital (incentive spirometer) which will help keep your temperature down.  It is common for your temperature to cycle up and down following surgery, especially at night when you are not up moving around and exerting yourself.  The breathing machine keeps your lungs expanded and your temperature down.   DIET:  As you were doing prior to hospitalization, we recommend a well-balanced diet.  DRESSING / WOUND CARE / SHOWERING  Keep the surgical dressing until follow up.  The dressing is water proof, so you can shower without any extra covering.  IF THE DRESSING FALLS OFF or the wound gets wet inside, change the dressing with sterile gauze.  Please use good hand washing techniques before changing the   dressing.  Do not use any lotions or creams on the incision until instructed by your surgeon.    ACTIVITY  Increase activity slowly as tolerated, but follow the weight bearing instructions below.   No driving for 6 weeks or until further direction given by your physician.  You cannot drive while taking narcotics.  No lifting or carrying greater than 10 lbs. until further directed by  your surgeon. Avoid periods of inactivity such as sitting longer than an hour when not asleep. This helps prevent blood clots.  You may return to work once you are authorized by your doctor.     WEIGHT BEARING   Weight bearing as tolerated with assist device (walker, cane, etc) as directed, use it as long as suggested by your surgeon or therapist, typically at least 4-6 weeks.   EXERCISES  Results after joint replacement surgery are often greatly improved when you follow the exercise, range of motion and muscle strengthening exercises prescribed by your doctor. Safety measures are also important to protect the joint from further injury. Any time any of these exercises cause you to have increased pain or swelling, decrease what you are doing until you are comfortable again and then slowly increase them. If you have problems or questions, call your caregiver or physical therapist for advice.   Rehabilitation is important following a joint replacement. After just a few days of immobilization, the muscles of the leg can become weakened and shrink (atrophy).  These exercises are designed to build up the tone and strength of the thigh and leg muscles and to improve motion. Often times heat used for twenty to thirty minutes before working out will loosen up your tissues and help with improving the range of motion but do not use heat for the first two weeks following surgery (sometimes heat can increase post-operative swelling).   These exercises can be done on a training (exercise) mat, on the floor, on a table or on a bed. Use whatever works the best and is most comfortable for you.    Use music or television while you are exercising so that the exercises are a pleasant break in your day. This will make your life better with the exercises acting as a break in your routine that you can look forward to.   Perform all exercises about fifteen times, three times per day or as directed.  You should exercise  both the operative leg and the other leg as well.  Exercises include:   Quad Sets - Tighten up the muscle on the front of the thigh (Quad) and hold for 5-10 seconds.   Straight Leg Raises - With your knee straight (if you were given a brace, keep it on), lift the leg to 60 degrees, hold for 3 seconds, and slowly lower the leg.  Perform this exercise against resistance later as your leg gets stronger.  Leg Slides: Lying on your back, slowly slide your foot toward your buttocks, bending your knee up off the floor (only go as far as is comfortable). Then slowly slide your foot back down until your leg is flat on the floor again.  Angel Wings: Lying on your back spread your legs to the side as far apart as you can without causing discomfort.  Hamstring Strength:  Lying on your back, push your heel against the floor with your leg straight by tightening up the muscles of your buttocks.  Repeat, but this time bend your knee to a comfortable angle, and push your   heel against the floor.  You may put a pillow under the heel to make it more comfortable if necessary.   A rehabilitation program following joint replacement surgery can speed recovery and prevent re-injury in the future due to weakened muscles. Contact your doctor or a physical therapist for more information on knee rehabilitation.    CONSTIPATION  Constipation is defined medically as fewer than three stools per week and severe constipation as less than one stool per week.  Even if you have a regular bowel pattern at home, your normal regimen is likely to be disrupted due to multiple reasons following surgery.  Combination of anesthesia, postoperative narcotics, change in appetite and fluid intake all can affect your bowels.   YOU MUST use at least one of the following options; they are listed in order of increasing strength to get the job done.  They are all available over the counter, and you may need to use some, POSSIBLY even all of these  options:    Drink plenty of fluids (prune juice may be helpful) and high fiber foods Colace 100 mg by mouth twice a day  Senokot for constipation as directed and as needed Dulcolax (bisacodyl), take with full glass of water  Miralax (polyethylene glycol) once or twice a day as needed.  If you have tried all these things and are unable to have a bowel movement in the first 3-4 days after surgery call either your surgeon or your primary doctor.    If you experience loose stools or diarrhea, hold the medications until you stool forms back up.  If your symptoms do not get better within 1 week or if they get worse, check with your doctor.  If you experience "the worst abdominal pain ever" or develop nausea or vomiting, please contact the office immediately for further recommendations for treatment.   ITCHING:  If you experience itching with your medications, try taking only a single pain pill, or even half a pain pill at a time.  You can also use Benadryl over the counter for itching or also to help with sleep.   TED HOSE STOCKINGS:  Use stockings on both legs until for at least 2 weeks or as directed by physician office. They may be removed at night for sleeping.  MEDICATIONS:  See your medication summary on the "After Visit Summary" that nursing will review with you.  You may have some home medications which will be placed on hold until you complete the course of blood thinner medication.  It is important for you to complete the blood thinner medication as prescribed.  PRECAUTIONS:  If you experience chest pain or shortness of breath - call 911 immediately for transfer to the hospital emergency department.   If you develop a fever greater that 101 F, purulent drainage from wound, increased redness or drainage from wound, foul odor from the wound/dressing, or calf pain - CONTACT YOUR SURGEON.                                                   FOLLOW-UP APPOINTMENTS:  If you do not already have a  post-op appointment, please call the office for an appointment to be seen by your surgeon.  Guidelines for how soon to be seen are listed in your "After Visit Summary", but are typically between 1-4 weeks after surgery.    OTHER INSTRUCTIONS:   Knee Replacement:  Do not place pillow under knee, focus on keeping the knee straight while resting. CPM instructions: 0-90 degrees, 2 hours in the morning, 2 hours in the afternoon, and 2 hours in the evening. Place foam block, curve side up under heel at all times except when in CPM or when walking.  DO NOT modify, tear, cut, or change the foam block in any way.  POST-OPERATIVE OPIOID TAPER INSTRUCTIONS: It is important to wean off of your opioid medication as soon as possible. If you do not need pain medication after your surgery it is ok to stop day one. Opioids include: Codeine, Hydrocodone(Norco, Vicodin), Oxycodone(Percocet, oxycontin) and hydromorphone amongst others.  Long term and even short term use of opiods can cause: Increased pain response Dependence Constipation Depression Respiratory depression And more.  Withdrawal symptoms can include Flu like symptoms Nausea, vomiting And more Techniques to manage these symptoms Hydrate well Eat regular healthy meals Stay active Use relaxation techniques(deep breathing, meditating, yoga) Do Not substitute Alcohol to help with tapering If you have been on opioids for less than two weeks and do not have pain than it is ok to stop all together.  Plan to wean off of opioids This plan should start within one week post op of your joint replacement. Maintain the same interval or time between taking each dose and first decrease the dose.  Cut the total daily intake of opioids by one tablet each day Next start to increase the time between doses. The last dose that should be eliminated is the evening dose.   MAKE SURE YOU:  Understand these instructions.  Get help right away if you are not doing  well or get worse.    Thank you for letting us be a part of your medical care team.  It is a privilege we respect greatly.  We hope these instructions will help you stay on track for a fast and full recovery!      

## 2022-09-12 NOTE — Transfer of Care (Signed)
Immediate Anesthesia Transfer of Care Note  Patient: Jessica [name]  Procedure(s) Performed: RIGHT TOTAL KNEE ARTHROPLASTY (Right: Knee)  Patient Location: PACU  Anesthesia Type:MAC and Spinal  Level of Consciousness: awake  Airway & Oxygen Therapy: Patient Spontanous Breathing  Post-op Assessment: Report given to RN and Post -op Vital signs reviewed and stable  Post vital signs: Reviewed and stable  Last Vitals:  Vitals Value Taken Time  BP 142/70 09/12/22 0925  Temp    Pulse 71 09/12/22 0926  Resp 13 09/12/22 0926  SpO2 98 % 09/12/22 0926  Vitals shown include unvalidated device data.  Last Pain:  Vitals:   09/12/22 0634  TempSrc: Oral  PainSc:          Complications: No notable events documented.

## 2022-09-12 NOTE — Op Note (Signed)
Operative Note  Date of operation: 09/12/2022 Preoperative diagnosis: Right knee primary osteoarthritis Postoperative diagnosis: Same  Procedure: Right cemented total knee arthroplasty  Implants: Biomet/Zimmer cemented persona knee system Implant Name Type Inv. Item Serial No. Manufacturer Lot No. LRB No. Used Action  CEMENT BONE R 1X40 - [id number] Cement CEMENT BONE R 1X40  ZIMMER RECON(ORTH,TRAU,BIO,SG) [id number] Right 2 Implanted  COMP TIB PS KNEE E 0D RT - [id number] Joint COMP TIB PS KNEE E 0D RT  ZIMMER RECON(ORTH,TRAU,BIO,SG) [id number] Right 1 Implanted  INSERT TIB AS PERS SZ 6-7 14 - [id number] Insert INSERT TIB AS PERS SZ 6-7 14  ZIMMER RECON(ORTH,TRAU,BIO,SG) [id number] Right 1 Implanted  FEMUR CMT CR STD SZ 6 RT KNEE - [id number] Joint FEMUR CMT CR STD SZ 6 RT KNEE  ZIMMER RECON(ORTH,TRAU,BIO,SG) [id number] Right 1 Implanted  STEM POLY PAT PLY 51M KNEE - [id number] Knees STEM POLY PAT PLY 51M KNEE  ZIMMER RECON(ORTH,TRAU,BIO,SG) [id number] Right 1 Implanted   Surgeon: Vanita Panda. Magnus Ivan, MD Assistant: Rexene Edison, PA-C  Anesthesia: #1 right lower extremity adductor canal block, #2 spinal, #3 local EBL: Less than 100 cc Tourniquet time: 50 minutes Antibiotics: IV Ancef Complications: None  Indications: The patient is a 72 year old female well-known to [place].  She has debilitating arthritis involving her right knee that is now detrimentally affecting her quality of life, her mobility and her actives daily living.  She has failed conservative treatment for many years now and wishes to proceed with a total knee arthroplasty.  Her clinical exam shows valgus malalignment of the knee and her x-ray shows severe arthritis in all 3 compartments.  We discussed the risk of acute blood loss anemia, nerve or vessel injury, fracture, infection, implant failure, instability and wound healing issues as well as DVT.  We talked about her goals being hopefully decrease pain, improve mobility, and  improve quality of life.  Procedure description: After informed consent was obtained and the appropriate right knee was marked, anesthesia obtained a right lower extremity adductor canal block in the holding room.  The patient was then brought to the operating room and set up on the operating table where spinal anesthesia was obtained.  She was then laid in supine position on the operating table and a Foley catheter was placed.  A nonsterile tourniquet is placed around her upper right thigh and her right thigh, knee, leg, ankle and foot were prepped and draped with DuraPrep and sterile drapes.  A timeout was called and she identified as correct patient the correct right knee.  An Esmarch was then used to wrap out the leg and the tourniquet was plated to 300 mm of pressure.  With the knee extended a direct midline incision was made over the patella and carried proximally and distally.  Dissection was carried down to the knee joint and a medial parapatellar arthrotomy was made.  There was a moderate joint effusion encountered.  With the knee in a flexed position we found significant cartilage loss and a wide area over the lateral compartment of the knee as well as the patellofemoral joint.  We remove remnants of the ACL as well as medial lateral meniscus.  We removed osteophytes from all 3 compartments.  We then used the extramedullary cutting guide for making her proximal tibia cut correction for varus and valgus and a 7 degree slope we made this cut to take 2 mm off the low side.  We then backed this down to more millimeters because it felt like it was not  enough bone.  We then used an intramedullary cutting guide for distal femur cut setting this for a right knee at 5 degrees externally rotated we made that cut without difficulty and brought that down to more millimeters.  We placed a 10 mm extension block in the knee and we did achieve full extension.  We then moved back to the femur and put a femoral sizing  guide based off the epicondylar axis.  Based off of this we chose a size 6 femur.  We put a 4-in-1 cutting block for size 6 femur and made her anterior posterior cuts followed by her chamfer cuts.  We then went back to the tibia and chose a size E tibial tray for right knee with coverage over the tibial plateau setting the rotation off the tibial tubercle and the femur.  We did our drill hole and keel punch off of this.  We then trialed that size E right tibial tray followed by our size 6 right CR standard femur.  We went up to a 14 mm medial congruent polythene insert and I was pleased more so with the alignment and stability of that 14 insert.  We then made a patella cut and drilled 3 holes for size 29 patella button.  We then put the knee through several cycles of motion with all instrumentation and trial components in the knee and we are pleased with range of motion and stability.  We then removed all trial implants from the knee and irrigated knee with normal saline solution.  We then mixed our cement and with the knee in a flexed position cemented our Biomet Zimmer persona tibial tray for right knee size E followed by cementing our size 6 right CR standard femur.  We placed a 14 mm right medial congruent polyethylene insert and cemented our size 29 patella button.  We then held the knee fully extended while the cement hardened and compressed the knee.  Once the tourniquet was let down hemostasis was obtained with electrocautery.  The cement hardened nicely.  The arthrotomy was closed with interrupted #1 Vicryl suture followed by 0 Vicryl close deep tissue and 2-0 Vicryl to close the subcutaneous tissue.  The skin was closed with staples.  Well-padded sterile dressing was applied.  The patient was taken recovery room in stable addition.  Rexene Edison PA-C did assist during the entire case and beginning to end and his assistance was medically necessary and crucial for soft tissue management and retraction, helping  guide implant placement and a layered closure of the wound.

## 2022-09-12 NOTE — Progress Notes (Signed)
Patient ID: Jessica [name], female   DOB: 1950-11-20, 72 y.o.   MRN: [id number] I spoke to the patient just a bit ago.  I think I had her mixed up with another patient who has good family support and is able to go home in the next day or 2 after surgery.  This is different for Jessica [name].  She lives alone with her husband who has significant disabilities.  She will need short-term skilled nursing placement after this hospitalization for her knee replacement.  She needs to be transferred from [facility] to [address] when possible.  I will also consult the transitional care team.

## 2022-09-12 NOTE — Evaluation (Signed)
Physical Therapy Evaluation Patient Details Name: Jessica [name] MRN: [id number] DOB: February 11, 1951 Today's Date: 09/12/2022  History of Present Illness  71 y.o. female presents to [facility] on 09/12/2022 for elective R TKA. PMH includes anxiety, asthma, DM, depression, GERD, HLD, HTN, IBS.  Clinical Impression  Pt presents to PT with deficits in functional mobility, gait, balance, strength, power, sensation. Pt reports some return in sensation, although still reduced compared to baseline. Pt requires assistance to perform bed mobility and to transfer. Pt reports feelings on knee instability during pre-gait training, PT defers ambulation away from bedside due to continued sensation impairment and risk for buckling. PT will follow up in the morning for further gait and mobility training.       Recommendations for follow up therapy are one component of a multi-disciplinary discharge planning process, led by the attending physician.  Recommendations may be updated based on patient status, additional functional criteria and insurance authorization.  Follow Up Recommendations Can patient physically be transported by private vehicle: No     Assistance Recommended at Discharge Intermittent Supervision/Assistance  Patient can return home with the following  A little help with walking and/or transfers;Assistance with cooking/housework;A lot of help with bathing/dressing/bathroom;Assist for transportation;Help with stairs or ramp for entrance    Equipment Recommendations  (defer to post-acute setting)  Recommendations for Other Services       Functional Status Assessment Patient has had a recent decline in their functional status and demonstrates the ability to make significant improvements in function in a reasonable and predictable amount of time.     Precautions / Restrictions Precautions Precautions: Fall;Knee Precaution Booklet Issued: Yes (comment) Required Braces or Orthoses: Knee  Immobilizer - Right Restrictions Weight Bearing Restrictions: Yes RLE Weight Bearing: Weight bearing as tolerated      Mobility  Bed Mobility Overal bed mobility: Needs Assistance Bed Mobility: Supine to Sit, Sit to Supine     Supine to sit: Min assist, HOB elevated Sit to supine: Min assist   General bed mobility comments: increased time, use of rails    Transfers Overall transfer level: Needs assistance Equipment used: Rolling walker (2 wheels) Transfers: Sit to/from Stand Sit to Stand: Min assist                Ambulation/Gait             Pre-gait activities: pt reports feelings of mild R knee buckling with attempts at marching in place. Pt side steps at edge of bed, reduced foot clearance bilaterally. Ambulation away from bedside deferred due to falls risk    Stairs            Wheelchair Mobility    Modified Rankin (Stroke Patients Only)       Balance Overall balance assessment: Needs assistance Sitting-balance support: No upper extremity supported, Feet supported Sitting balance-Leahy Scale: Good     Standing balance support: Single extremity supported, Reliant on assistive device for balance Standing balance-Leahy Scale: Poor                               Pertinent Vitals/Pain Pain Assessment Pain Assessment: 0-10 Pain Score: 5  Pain Location: R knee Pain Descriptors / Indicators: Aching Pain Intervention(s): Monitored during session    Home Living Family/patient expects to be discharged to:: Private residence Living Arrangements: Spouse/significant other Available Help at Discharge: Family;Available PRN/intermittently (spouse is unable to physically assist) Type of Home: House Home Access:  Ramped entrance       Home Layout: Able to live on main level with bedroom/bathroom Home Equipment: Rolling Walker (2 wheels);Cane - single point;Shower seat - built in;Grab bars - tub/shower      Prior Function Prior Level  of Function : Independent/Modified Independent;Driving                     Hand Dominance        Extremity/Trunk Assessment   Upper Extremity Assessment Upper Extremity Assessment: Overall WFL for tasks assessed    Lower Extremity Assessment Lower Extremity Assessment: RLE deficits/detail RLE Deficits / Details: post-op ROM limitations as expected s/p TKA. Pt performs SLR with increase effort, no quad lag although significant dressing around knee RLE Sensation: decreased light touch    Cervical / Trunk Assessment Cervical / Trunk Assessment: Normal  Communication   Communication: No difficulties  Cognition Arousal/Alertness: Awake/alert Behavior During Therapy: WFL for tasks assessed/performed Overall Cognitive Status: Within Functional Limits for tasks assessed                                          General Comments General comments (skin integrity, edema, etc.): VSS on RA    Exercises     Assessment/Plan    PT Assessment Patient needs continued PT services  PT Problem List Decreased strength;Decreased range of motion;Decreased activity tolerance;Decreased balance;Decreased mobility;Decreased knowledge of use of DME;Pain;Impaired sensation       PT Treatment Interventions DME instruction;Gait training;Functional mobility training;Therapeutic activities;Therapeutic exercise;Balance training;Neuromuscular re-education;Patient/family education    PT Goals (Current goals can be found in the Care Plan section)  Acute Rehab PT Goals Patient Stated Goal: to go to short term rehab, return to independence PT Goal Formulation: With patient Time For Goal Achievement: 09/19/22 Potential to Achieve Goals: Good    Frequency 7X/week     Co-evaluation               AM-PAC PT "6 Clicks" Mobility  Outcome Measure Help needed turning from your back to your side while in a flat bed without using bedrails?: A Little Help needed moving from  lying on your back to sitting on the side of a flat bed without using bedrails?: A Little Help needed moving to and from a bed to a chair (including a wheelchair)?: A Little Help needed standing up from a chair using your arms (e.g., wheelchair or bedside chair)?: A Little Help needed to walk in hospital room?: Total Help needed climbing 3-5 steps with a railing? : Total 6 Click Score: 14    End of Session   Activity Tolerance: Patient tolerated treatment well Patient left: in bed;with call bell/phone within reach;with family/visitor present Nurse Communication: Mobility status PT Visit Diagnosis: Other abnormalities of gait and mobility (R26.89);Muscle weakness (generalized) (M62.81);Pain Pain - Right/Left: Right Pain - part of body: Knee    Time: 1427-1510 PT Time Calculation (min) (ACUTE ONLY): 43 min   Charges:   PT Evaluation $PT Eval Low Complexity: 1 Low          Arlyss Gandy, PT, DPT Acute Rehabilitation Office [phone]   Arlyss Gandy 09/12/2022, 3:30 PM

## 2022-09-12 NOTE — Plan of Care (Signed)
  Problem: Nutrition: Goal: Adequate nutrition will be maintained Outcome: Progressing   Problem: Pain Managment: Goal: General experience of comfort will improve Outcome: Progressing   

## 2022-09-12 NOTE — Plan of Care (Signed)
  Problem: Education: Goal: Knowledge of General Education information will improve Description: Including pain rating scale, medication(s)/side effects and non-pharmacologic comfort measures Outcome: Progressing   Problem: Health Behavior/Discharge Planning: Goal: Ability to manage health-related needs will improve Outcome: Progressing   Problem: Clinical Measurements: Goal: Ability to maintain clinical measurements within normal limits will improve Outcome: Progressing Goal: Will remain free from infection Outcome: Progressing Goal: Diagnostic test results will improve Outcome: Progressing Goal: Respiratory complications will improve Outcome: Progressing Goal: Cardiovascular complication will be avoided Outcome: Progressing   Problem: Activity: Goal: Risk for activity intolerance will decrease Outcome: Progressing   Problem: Nutrition: Goal: Adequate nutrition will be maintained Outcome: Progressing   Problem: Coping: Goal: Level of anxiety will decrease Outcome: Progressing   Problem: Elimination: Goal: Will not experience complications related to bowel motility Outcome: Progressing Goal: Will not experience complications related to urinary retention Outcome: Progressing   Problem: Pain Managment: Goal: General experience of comfort will improve Outcome: Progressing   Problem: Safety: Goal: Ability to remain free from injury will improve Outcome: Progressing   Problem: Skin Integrity: Goal: Risk for impaired skin integrity will decrease Outcome: Progressing   Problem: Education: Goal: Ability to describe self-care measures that may prevent or decrease complications (Diabetes Survival Skills Education) will improve Outcome: Progressing Goal: Individualized Educational Video(s) Outcome: Progressing   Problem: Coping: Goal: Ability to adjust to condition or change in health will improve Outcome: Progressing   Problem: Fluid Volume: Goal: Ability to  maintain a balanced intake and output will improve Outcome: Progressing   Problem: Health Behavior/Discharge Planning: Goal: Ability to identify and utilize available resources and services will improve Outcome: Progressing Goal: Ability to manage health-related needs will improve Outcome: Progressing   Problem: Metabolic: Goal: Ability to maintain appropriate glucose levels will improve Outcome: Progressing   Problem: Nutritional: Goal: Maintenance of adequate nutrition will improve Outcome: Progressing Goal: Progress toward achieving an optimal weight will improve Outcome: Progressing   Problem: Skin Integrity: Goal: Risk for impaired skin integrity will decrease Outcome: Progressing   Problem: Tissue Perfusion: Goal: Adequacy of tissue perfusion will improve Outcome: Progressing   Problem: Education: Goal: Knowledge of the prescribed therapeutic regimen will improve Outcome: Progressing Goal: Individualized Educational Video(s) Outcome: Progressing   Problem: Activity: Goal: Ability to avoid complications of mobility impairment will improve Outcome: Progressing Goal: Range of joint motion will improve Outcome: Progressing   Problem: Clinical Measurements: Goal: Postoperative complications will be avoided or minimized Outcome: Progressing   Problem: Pain Management: Goal: Pain level will decrease with appropriate interventions Outcome: Progressing   Problem: Skin Integrity: Goal: Will show signs of wound healing Outcome: Progressing   

## 2022-09-12 NOTE — Progress Notes (Signed)
Pt. Reported this am at 0400 CBG 69 and took 3 glucose tabs. When pt. Arrived to short Stay blood sugar 145.

## 2022-09-13 ENCOUNTER — Encounter (HOSPITAL_COMMUNITY): Payer: Self-pay | Admitting: Orthopaedic Surgery

## 2022-09-13 DIAGNOSIS — K589 Irritable bowel syndrome without diarrhea: Secondary | ICD-10-CM | POA: Diagnosis present

## 2022-09-13 DIAGNOSIS — Z96651 Presence of right artificial knee joint: Secondary | ICD-10-CM | POA: Diagnosis present

## 2022-09-13 DIAGNOSIS — Z8249 Family history of ischemic heart disease and other diseases of the circulatory system: Secondary | ICD-10-CM | POA: Diagnosis not present

## 2022-09-13 DIAGNOSIS — E669 Obesity, unspecified: Secondary | ICD-10-CM | POA: Diagnosis present

## 2022-09-13 DIAGNOSIS — Z8719 Personal history of other diseases of the digestive system: Secondary | ICD-10-CM | POA: Diagnosis not present

## 2022-09-13 DIAGNOSIS — K900 Celiac disease: Secondary | ICD-10-CM | POA: Diagnosis present

## 2022-09-13 DIAGNOSIS — E1169 Type 2 diabetes mellitus with other specified complication: Secondary | ICD-10-CM | POA: Diagnosis present

## 2022-09-13 DIAGNOSIS — G4733 Obstructive sleep apnea (adult) (pediatric): Secondary | ICD-10-CM | POA: Diagnosis present

## 2022-09-13 DIAGNOSIS — Z96659 Presence of unspecified artificial knee joint: Secondary | ICD-10-CM

## 2022-09-13 DIAGNOSIS — Z6837 Body mass index (BMI) 37.0-37.9, adult: Secondary | ICD-10-CM | POA: Diagnosis not present

## 2022-09-13 DIAGNOSIS — E782 Mixed hyperlipidemia: Secondary | ICD-10-CM | POA: Diagnosis present

## 2022-09-13 DIAGNOSIS — E1142 Type 2 diabetes mellitus with diabetic polyneuropathy: Secondary | ICD-10-CM | POA: Diagnosis present

## 2022-09-13 DIAGNOSIS — M8580 Other specified disorders of bone density and structure, unspecified site: Secondary | ICD-10-CM | POA: Diagnosis present

## 2022-09-13 DIAGNOSIS — Z9841 Cataract extraction status, right eye: Secondary | ICD-10-CM | POA: Diagnosis not present

## 2022-09-13 DIAGNOSIS — Z87442 Personal history of urinary calculi: Secondary | ICD-10-CM | POA: Diagnosis not present

## 2022-09-13 DIAGNOSIS — Z9842 Cataract extraction status, left eye: Secondary | ICD-10-CM | POA: Diagnosis not present

## 2022-09-13 DIAGNOSIS — K760 Fatty (change of) liver, not elsewhere classified: Secondary | ICD-10-CM | POA: Diagnosis present

## 2022-09-13 DIAGNOSIS — Z7982 Long term (current) use of aspirin: Secondary | ICD-10-CM | POA: Diagnosis not present

## 2022-09-13 DIAGNOSIS — Z7985 Long-term (current) use of injectable non-insulin antidiabetic drugs: Secondary | ICD-10-CM | POA: Diagnosis not present

## 2022-09-13 DIAGNOSIS — M1711 Unilateral primary osteoarthritis, right knee: Secondary | ICD-10-CM | POA: Diagnosis present

## 2022-09-13 DIAGNOSIS — K219 Gastro-esophageal reflux disease without esophagitis: Secondary | ICD-10-CM | POA: Diagnosis present

## 2022-09-13 DIAGNOSIS — Z9049 Acquired absence of other specified parts of digestive tract: Secondary | ICD-10-CM | POA: Diagnosis not present

## 2022-09-13 DIAGNOSIS — Z794 Long term (current) use of insulin: Secondary | ICD-10-CM | POA: Diagnosis not present

## 2022-09-13 DIAGNOSIS — Z79899 Other long term (current) drug therapy: Secondary | ICD-10-CM | POA: Diagnosis not present

## 2022-09-13 DIAGNOSIS — I10 Essential (primary) hypertension: Secondary | ICD-10-CM | POA: Diagnosis present

## 2022-09-13 DIAGNOSIS — Z9851 Tubal ligation status: Secondary | ICD-10-CM | POA: Diagnosis not present

## 2022-09-13 LAB — BASIC METABOLIC PANEL
Anion gap: 7 (ref 5–15)
BUN: 15 mg/dL (ref 8–23)
CO2: 25 mmol/L (ref 22–32)
Calcium: 8.7 mg/dL — ABNORMAL LOW (ref 8.9–10.3)
Chloride: 104 mmol/L (ref 98–111)
Creatinine, Ser: 0.93 mg/dL (ref 0.44–1.00)
GFR, Estimated: >60 mL/min (ref >60)
Glucose, Bld: 217 mg/dL — ABNORMAL HIGH (ref 70–99)
Potassium: 4.4 mmol/L (ref 3.5–5.1)
Sodium: 136 mmol/L (ref 135–145)

## 2022-09-13 LAB — CBC
HCT: 31.8 % — ABNORMAL LOW (ref 36.0–46.0)
Hemoglobin: 10.9 g/dL — ABNORMAL LOW (ref 12.0–15.0)
MCH: 33.1 pg (ref 26.0–34.0)
MCHC: 34.3 g/dL (ref 30.0–36.0)
MCV: 96.7 fL (ref 80.0–100.0)
Platelets: 203 10*3/uL (ref 150–400)
RBC: 3.29 MIL/uL — ABNORMAL LOW (ref 3.87–5.11)
RDW: 11.8 % (ref 11.5–15.5)
WBC: 7.7 10*3/uL (ref 4.0–10.5)
nRBC: 0 % (ref 0.0–0.2)

## 2022-09-13 LAB — GLUCOSE, CAPILLARY
Glucose-Capillary: 205 mg/dL — ABNORMAL HIGH (ref 70–99)
Glucose-Capillary: 244 mg/dL — ABNORMAL HIGH (ref 70–99)
Glucose-Capillary: 263 mg/dL — ABNORMAL HIGH (ref 70–99)
Glucose-Capillary: 186 mg/dL — ABNORMAL HIGH (ref 70–99)

## 2022-09-13 MED ORDER — APIXABAN 2.5 MG PO TABS: 2.5000 mg | ORAL_TABLET | Freq: Two times a day (BID) | ORAL | 0 refills | Status: DC

## 2022-09-13 MED ORDER — METHOCARBAMOL 500 MG PO TABS: 500.0000 mg | ORAL_TABLET | Freq: Four times a day (QID) | ORAL | 0 refills | Status: DC | PRN

## 2022-09-13 MED ORDER — OXYCODONE HCL 5 MG PO TABS: 5.0000 mg | ORAL_TABLET | ORAL | 0 refills | Status: DC | PRN

## 2022-09-13 NOTE — Progress Notes (Signed)
Subjective: 1 Day Post-Op Procedure(s) (LRB): RIGHT TOTAL KNEE ARTHROPLASTY (Right) Patient reports pain as moderate.    Objective: Vital signs in last 24 hours: Temp:  [97.6 F (36.4 C)-98.8 F (37.1 C)] 98.8 F (37.1 C) (05/15 0730) Pulse Rate:  [48-93] 83 (05/15 0730) Resp:  [10-18] 18 (05/15 0529) BP: (101-185)/(63-79) 101/71 (05/15 0730) SpO2:  [96 %-100 %] 100 % (05/15 0730)  Intake/Output from previous day: 05/14 0701 - 05/15 0700 In: 1990 [P.O.:240; I.V.:1300; IV Piggyback:450] Out: 1850 [Urine:1800; Blood:50] Intake/Output this shift: No intake/output data recorded.  Recent Labs    09/13/22 0624  HGB 10.9*   Recent Labs    09/13/22 0624  WBC 7.7  RBC 3.29*  HCT 31.8*  PLT 203   Recent Labs    09/13/22 0624  NA 136  K 4.4  CL 104  CO2 25  BUN 15  CREATININE 0.93  GLUCOSE 217*  CALCIUM 8.7*   No results for input(s): "LABPT", "INR" in the last 72 hours.  Sensation intact distally Intact pulses distally Dorsiflexion/Plantar flexion intact Incision: dressing C/D/I Compartment soft   Assessment/Plan: 1 Day Post-Op Procedure(s) (LRB): RIGHT TOTAL KNEE ARTHROPLASTY (Right) Up with therapy Discharge to SNF next 1-2 days      Kathryne Hitch 09/13/2022, 7:56 AM

## 2022-09-13 NOTE — NC FL2 (Signed)
[place] MEDICAID FL2 LEVEL OF CARE FORM     IDENTIFICATION  Patient Name: Jessica [name] Birthdate: 1950-12-03 Sex: female Admission Date (Current Location): 09/12/2022  [facility] and [place] Number:  Producer, television/film/video and Address:  [facility] [place], [address], [place], [place] [address]      Provider Number: [id number]  Attending Physician Name and Address:  Kathryne Hitch,*  Relative Name and Phone Number:  Aarohi, Eichorn   [phone]    Current Level of Care: Hospital Recommended Level of Care: Skilled Nursing Facility Prior Approval Number:    Date Approved/Denied:   PASRR Number: [id number]  Discharge Plan: SNF    Current Diagnoses: Patient Active Problem List   Diagnosis Date Noted   Status post total right knee replacement 09/12/2022   Generalized headache 08/17/2022   Degenerative arthritis of left knee 08/07/2022   BMI 34.0-34.9,adult 06/08/2022   Chronic rhinitis 04/18/2022   Mild persistent asthma without complication 04/18/2022   Gastroesophageal reflux disease 04/18/2022   Vitamin D deficiency 04/13/2022   Type 2 diabetes mellitus with obesity (HCC) 04/13/2022   Morbid obesity (HCC) 04/13/2022   Peripheral sensory neuropathy due to type 2 diabetes mellitus (HCC) 04/05/2020   Bilateral lower extremity edema 01/20/2020   Family history of malignant neoplasm of endometrium 10/08/2018   Celiac disease 02/14/2018   Osteopenia 02/14/2018   Mild intermittent asthma 02/14/2018   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) 02/14/2018   Class 2 obesity due to excess calories with body mass index (BMI) of 37.0 to 37.9 in adult 02/14/2018   Fibroma of tongue 06/06/2016   Renal calculi 01/13/2016   Unilateral primary osteoarthritis, right knee 10/29/2015   Trigger point of right shoulder region 08/02/2015   Degenerative cervical disc 07/27/2015   OSA (obstructive sleep apnea) 02/28/2011    Constipation, slow transit 09/23/2010   Type 2 diabetes mellitus with complication, with long-term current use of insulin (HCC) 09/23/2010   FH: colon cancer 09/23/2010   Personal history of colonic polyps 09/23/2010   Combined hyperlipidemia associated with type 2 diabetes mellitus (HCC) 01/29/2009   Diabetic gastroparesis (HCC) 02/27/2007   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) 10/22/2006   Essential hypertension 10/22/2006    Orientation RESPIRATION BLADDER Height & Weight     Self, Time, Situation, Place  Normal Continent, Indwelling catheter Weight: 210 lb (95.3 kg) Height:  5\' 4"  (162.6 cm)  BEHAVIORAL SYMPTOMS/MOOD NEUROLOGICAL BOWEL NUTRITION STATUS      Continent Diet (see discharge summary)  AMBULATORY STATUS COMMUNICATION OF NEEDS Skin   Total Care Verbally Surgical wounds                       Personal Care Assistance Level of Assistance  Bathing, Feeding, Dressing Bathing Assistance: Limited assistance Feeding assistance: Independent Dressing Assistance: Limited assistance     Functional Limitations Info  Sight, Hearing, Speech Sight Info: Adequate Hearing Info: Adequate Speech Info: Adequate    SPECIAL CARE FACTORS FREQUENCY  PT (By licensed PT), OT (By licensed OT)     PT Frequency: 5x week OT Frequency: 5x week            Contractures Contractures Info: Not present    Additional Factors Info  Code Status, Allergies, Insulin Sliding Scale Code Status Info: full Allergies Info: Codeine, Gluten Meal, Metronidazole, Nsaids, Soy Allergy, Wheat   Insulin Sliding Scale Info: Novolog: see discharge summary  Current Medications (09/13/2022):  This is the current hospital active medication list Current Facility-Administered Medications  Medication Dose Route Frequency Provider Last Rate Last Admin   0.9 %  sodium chloride infusion   Intravenous Continuous Kathryne Hitch, MD 75 mL/hr at 09/12/22  1637 New Bag at 09/12/22 1637   acetaminophen (TYLENOL) tablet 325-650 mg  325-650 mg Oral Q6H PRN Kathryne Hitch, MD       alum & mag hydroxide-simeth (MAALOX/MYLANTA) 200-200-20 MG/5ML suspension 30 mL  30 mL Oral Q4H PRN Kathryne Hitch, MD       apixaban Everlene Balls) tablet 2.5 mg  2.5 mg Oral Q12H Kathryne Hitch, MD   2.5 mg at 09/13/22 0981   atorvastatin (LIPITOR) tablet 40 mg  40 mg Oral Daily Kathryne Hitch, MD   40 mg at 09/13/22 1914   diphenhydrAMINE (BENADRYL) 12.5 MG/5ML elixir 12.5-25 mg  12.5-25 mg Oral Q4H PRN Kathryne Hitch, MD       docusate sodium (COLACE) capsule 100 mg  100 mg Oral BID Kathryne Hitch, MD   100 mg at 09/12/22 1307   furosemide (LASIX) tablet 20 mg  20 mg Oral Daily Kathryne Hitch, MD   20 mg at 09/13/22 7829   gabapentin (NEURONTIN) capsule 600 mg  600 mg Oral QHS Kathryne Hitch, MD   600 mg at 09/12/22 2203   HYDROmorphone (DILAUDID) injection 0.5-1 mg  0.5-1 mg Intravenous Q4H PRN Kathryne Hitch, MD   1 mg at 09/13/22 0143   insulin aspart (novoLOG) injection 0-15 Units  0-15 Units Subcutaneous TID WC Kathryne Hitch, MD   5 Units at 09/13/22 5621   insulin aspart (novoLOG) injection 6 Units  6 Units Subcutaneous TID WC Kathryne Hitch, MD   6 Units at 09/13/22 0821   insulin glargine-yfgn (SEMGLEE) injection 15 Units  15 Units Subcutaneous QHS Kathryne Hitch, MD   15 Units at 09/12/22 2206   irbesartan (AVAPRO) tablet 300 mg  300 mg Oral Daily Kathryne Hitch, MD   300 mg at 09/13/22 3086   menthol-cetylpyridinium (CEPACOL) lozenge 3 mg  1 lozenge Oral PRN Kathryne Hitch, MD       Or   phenol (CHLORASEPTIC) mouth spray 1 spray  1 spray Mouth/Throat PRN Kathryne Hitch, MD       methocarbamol (ROBAXIN) tablet 500 mg  500 mg Oral Q6H PRN Kathryne Hitch, MD   500 mg at 09/13/22 5784   Or   methocarbamol (ROBAXIN) 500 mg in  dextrose 5 % 50 mL IVPB  500 mg Intravenous Q6H PRN Kathryne Hitch, MD       metoCLOPramide (REGLAN) tablet 5-10 mg  5-10 mg Oral Q8H PRN Kathryne Hitch, MD       Or   metoCLOPramide (REGLAN) injection 5-10 mg  5-10 mg Intravenous Q8H PRN Kathryne Hitch, MD   10 mg at 09/13/22 0752   ondansetron (ZOFRAN) tablet 4 mg  4 mg Oral Q6H PRN Kathryne Hitch, MD       Or   ondansetron [facility]) injection 4 mg  4 mg Intravenous Q6H PRN Kathryne Hitch, MD   4 mg at 09/12/22 1513   oxyCODONE (Oxy IR/ROXICODONE) immediate release tablet 10-15 mg  10-15 mg Oral Q4H PRN Kathryne Hitch, MD   10 mg at 09/12/22 1307   oxyCODONE (Oxy IR/ROXICODONE) immediate release tablet 5-10 mg  5-10 mg Oral Q4H PRN Kathryne Hitch,  MD   10 mg at 09/13/22 0951   pantoprazole (PROTONIX) EC tablet 40 mg  40 mg Oral Daily Kathryne Hitch, MD   40 mg at 09/13/22 1610   polyethylene glycol (MIRALAX / GLYCOLAX) packet 17 g  17 g Oral Daily PRN Kathryne Hitch, MD       valACYclovir (VALTREX) tablet 500 mg  500 mg Oral Daily Kathryne Hitch, MD   500 mg at 09/13/22 9604     Discharge Medications: Please see discharge summary for a list of discharge medications.  Relevant Imaging Results:  Relevant Lab Results:   Additional Information SSN: [id number]  Lorri Frederick, LCSW

## 2022-09-13 NOTE — TOC Initial Note (Signed)
Transition of Care [facility]) - Initial/Assessment Note    Patient Details  Name: Jessica [name] MRN: [id number] Date of Birth: 1950-12-15  [facility]) CM/SW Contact:    Lorri Frederick, LCSW Phone Number: 09/13/2022, 10:12 AM  Clinical Narrative:      CSW met with pt regarding DC recommendation for SNF.  Pt does agree with this plan, [facility] [place]  CSW will investigate this.  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  No current services.  Permission given to send out referral in hub as well as to [facility].  [facility] determined [facility] [place] [facility], spoke to admissions, unlikely they can admit and get approval after total knee procedure, but will review anyways.  Fax: [phone].  Referral faxed.  Referral also sent out in hub.              Expected Discharge Plan: Skilled Nursing Facility Barriers to Discharge: Continued Medical Work up, SNF Pending bed offer   Patient Goals and CMS Choice Patient states their goals for this hospitalization and ongoing recovery are:: walk without cane   Choice offered to / list presented to : Patient      Expected Discharge Plan and Services In-house Referral: Clinical Social Work   Post Acute Care Choice: Skilled Nursing Facility Living arrangements for the past 2 months: Single Family Home                                      Prior Living Arrangements/Services Living arrangements for the past 2 months: Single Family Home Lives with:: Spouse Patient language and need for interpreter reviewed:: Yes Do you feel safe going back to the place where you live?: Yes      Need for Family Participation in Patient Care: No (Comment) Care giver support system in place?: Yes (comment) Current home services: Other (comment) (none) Criminal Activity/Legal Involvement Pertinent to Current  Situation/Hospitalization: No - Comment as needed  Activities of Daily Living      Permission Sought/Granted Permission sought to share information with : Family Supports Permission granted to share information with : Yes, Verbal Permission Granted  Share Information with NAME: son Ladene Artist, husband  Devar  Permission granted to share info w AGENCY: SNF/[facility]        Emotional Assessment Appearance:: Appears stated age Attitude/Demeanor/Rapport: Engaged Affect (typically observed): Appropriate, Pleasant Orientation: : Oriented to Self, Oriented to Place, Oriented to  Time, Oriented to Situation      Admission diagnosis:  Status post total right knee replacement [Z96.651] Patient Active Problem List   Diagnosis Date Noted   Status post total right knee replacement 09/12/2022   Generalized headache 08/17/2022   Degenerative arthritis of left knee 08/07/2022   BMI 34.0-34.9,adult 06/08/2022   Chronic rhinitis 04/18/2022   Mild persistent asthma without complication 04/18/2022   Gastroesophageal reflux disease 04/18/2022   Vitamin D deficiency 04/13/2022   Type 2 diabetes mellitus with obesity (HCC) 04/13/2022   Morbid obesity (HCC) 04/13/2022   Peripheral sensory neuropathy due to type 2 diabetes mellitus (HCC) 04/05/2020   Bilateral lower extremity edema 01/20/2020   Family history of malignant neoplasm of endometrium 10/08/2018   Celiac disease 02/14/2018   Osteopenia 02/14/2018   Mild intermittent asthma 02/14/2018   Diabetic neuropathy  associated with type 2 diabetes mellitus (HCC) 02/14/2018   Class 2 obesity due to excess calories with body mass index (BMI) of 37.0 to 37.9 in adult 02/14/2018   Fibroma of tongue 06/06/2016   Renal calculi 01/13/2016   Unilateral primary osteoarthritis, right knee 10/29/2015   Trigger point of right shoulder region 08/02/2015   Degenerative cervical disc 07/27/2015   OSA (obstructive sleep apnea) 02/28/2011   Constipation, slow  transit 09/23/2010   Type 2 diabetes mellitus with complication, with long-term current use of insulin (HCC) 09/23/2010   FH: colon cancer 09/23/2010   Personal history of colonic polyps 09/23/2010   Combined hyperlipidemia associated with type 2 diabetes mellitus (HCC) 01/29/2009   Diabetic gastroparesis (HCC) 02/27/2007   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 [place] [address] Phone: [phone] Fax: [phone]  [facility] - [place], [place] - [address] [place] [address] Phone: [phone] Fax: [phone]     Social Determinants of Health (SDOH) Social History: SDOH Screenings   Food Insecurity: No Food Insecurity (03/13/2022)  Housing: Low Risk  (03/13/2022)  Transportation Needs: No Transportation Needs (03/13/2022)  Depression (PHQ2-9): Low Risk  (04/12/2022)  Financial Resource Strain: Low Risk  (03/13/2022)  Physical Activity: Inactive (03/13/2022)  Social Connections: Moderately Integrated (03/13/2022)  Stress: Stress Concern Present (03/13/2022)  Tobacco Use: Low Risk  (09/13/2022)   SDOH Interventions:     Readmission Risk Interventions     No data to display

## 2022-09-13 NOTE — Progress Notes (Signed)
Physical Therapy Treatment Patient Details Name: Jessica [name] Catanzaro MRN: [id number] DOB: [birth year] Today's Date: 09/13/2022   History of Present Illness 72 y.o. female presents to [facility] hospital on 09/12/2022 for elective R TKA. PMH includes anxiety, asthma, DM, depression, GERD, HLD, HTN, IBS.    PT Comments    Continuing work on functional mobility and activity tolerance;  Session focused on functional transfers, progressive amb limited by significant pain RLE, and well as nausea; Moved slowly because of pain and nausea, but pt wanting to work, and agreed to getting OOB; opted to use KI this session for more confidence in knee stability; min assist to sit up to EOB, heavy min assist to stand, and able to take small steps from bed to recliner; Anticipate good progress at post-acute rehab  Recommendations for follow up therapy are one component of a multi-disciplinary discharge planning process, led by the attending physician.  Recommendations may be updated based on patient status, additional functional criteria and insurance authorization.  Follow Up Recommendations  Can patient physically be transported by private vehicle: No    Assistance Recommended at Discharge Intermittent Supervision/Assistance  Patient can return home with the following A little help with walking and/or transfers;Assistance with cooking/housework;A lot of help with bathing/dressing/bathroom;Assist for transportation;Help with stairs or ramp for entrance   Equipment Recommendations  Rolling walker (2 wheels);BSC/3in1 (can defer to post-acute setting)    Recommendations for Other Services       Precautions / Restrictions Precautions Precautions: Fall;Knee Precaution Booklet Issued: Yes (comment) Required Braces or Orthoses: Knee Immobilizer - Right Knee Immobilizer - Right: Other (comment) (in room, not in orders; opted to use KI this session for pt's incr confidence) Restrictions Weight Bearing Restrictions:  No RLE Weight Bearing: Weight bearing as tolerated     Mobility  Bed Mobility Overal bed mobility: Needs Assistance Bed Mobility: Supine to Sit     Supine to sit: Min assist, HOB elevated     General bed mobility comments: increased time, use of rails; Cues for technqiue, and assist to support RLE coming off of bed to decr pain    Transfers Overall transfer level: Needs assistance Equipment used: Rolling walker (2 wheels) Transfers: Sit to/from Stand, Bed to chair/wheelchair/BSC Sit to Stand: Min assist   Step pivot transfers: Min assist       General transfer comment: Cues for hand placement and safety; min assist to rise and steady; Cues for stepping sequence with pivot steps bed to recliner; painful in RLE stance, but no buckling noted (within the KI)    Ambulation/Gait               General Gait Details: Steps limited to step pivot transfer secondary to incr pain R knee   Stairs             Wheelchair Mobility    Modified Rankin (Stroke Patients Only)       Balance     Sitting balance-Leahy Scale: Good       Standing balance-Leahy Scale: Poor                              Cognition Arousal/Alertness: Awake/alert Behavior During Therapy: WFL for tasks assessed/performed Overall Cognitive Status: Within Functional Limits for tasks assessed  Exercises      General Comments General comments (skin integrity, edema, etc.): Pt with nausea during session, but able to work to get OOB      Pertinent Vitals/Pain Pain Assessment Pain Assessment: 0-10 Pain Score: 8  Pain Location: R knee Pain Descriptors / Indicators: Aching Pain Intervention(s): Monitored during session, Premedicated before session    Home Living                          Prior Function            PT Goals (current goals can now be found in the care plan section) Acute Rehab PT  Goals Patient Stated Goal: to go to short term rehab, return to independence PT Goal Formulation: With patient Time For Goal Achievement: 09/19/22 Potential to Achieve Goals: Good Progress towards PT goals: Progressing toward goals    Frequency    7X/week      PT Plan Current plan remains appropriate    Co-evaluation              AM-PAC PT "6 Clicks" Mobility   Outcome Measure  Help needed turning from your back to your side while in a flat bed without using bedrails?: A Little Help needed moving from lying on your back to sitting on the side of a flat bed without using bedrails?: A Little Help needed moving to and from a bed to a chair (including a wheelchair)?: A Little Help needed standing up from a chair using your arms (e.g., wheelchair or bedside chair)?: A Little Help needed to walk in hospital room?: A Lot Help needed climbing 3-5 steps with a railing? : Total 6 Click Score: 15    End of Session Equipment Utilized During Treatment: Right knee immobilizer;Gait belt Activity Tolerance: Patient tolerated treatment well Patient left: in chair;with call bell/phone within reach;with chair alarm set Nurse Communication: Mobility status;Patient requests pain meds PT Visit Diagnosis: Other abnormalities of gait and mobility (R26.89);Muscle weakness (generalized) (M62.81);Pain Pain - Right/Left: Right Pain - part of body: Knee     Time: 6045-4098 PT Time Calculation (min) (ACUTE ONLY): 42 min  Charges:  $Therapeutic Activity: 38-52 mins                     Van Clines, PT  Acute Rehabilitation Services Office [phone]    Levi Aland 09/13/2022, 1:49 PM

## 2022-09-13 NOTE — Progress Notes (Signed)
Physical Therapy Treatment Patient Details Name: Jessica [name] MRN: [id number] DOB: 05-Mar-1951 Today's Date: 09/13/2022   History of Present Illness 72 y.o. female presents to [facility] hospital on 09/12/2022 for elective R TKA. PMH includes anxiety, asthma, DM, depression, GERD, HLD, HTN, IBS.    PT Comments    Continuing work on functional mobility and activity tolerance;  Pt limited by nausea with standing this session, but pt able to walk approx 5 ft from recliner to bed with cues for sequence and close guard for safety; Pain continues to limit tolerance of R knee therex as well; Took extra time for knee precaution education   Recommendations for follow up therapy are one component of a multi-disciplinary discharge planning process, led by the attending physician.  Recommendations may be updated based on patient status, additional functional criteria and insurance authorization.  Follow Up Recommendations  Can patient physically be transported by private vehicle: No    Assistance Recommended at Discharge Intermittent Supervision/Assistance  Patient can return home with the following     Equipment Recommendations  Rolling walker (2 wheels);BSC/3in1 (can defer to post-acute rehab)    Recommendations for Other Services       Precautions / Restrictions Precautions Precautions: Fall;Knee Precaution Booklet Issued: Yes (comment) Required Braces or Orthoses: Knee Immobilizer - Right Knee Immobilizer - Right: Other (comment) (in room, not in orders; opted to use KI this session for pt's incr confidence) Restrictions Weight Bearing Restrictions: No RLE Weight Bearing: Weight bearing as tolerated   Pt educated to not allow any pillow or bolster under knee for healing with optimal range of motion.      Mobility  Bed Mobility Overal bed mobility: Needs Assistance Bed Mobility: Sit to Supine     Supine to sit: Min assist, HOB elevated Sit to supine: Min assist   General bed mobility  comments: min assist to help RLE into bed    Transfers Overall transfer level: Needs assistance Equipment used: Rolling walker (2 wheels) Transfers: Sit to/from Stand Sit to Stand: Min assist, Mod assist   Step pivot transfers: Min assist       General transfer comment: stood from recliner with mod assist to rise and steady as pt moved hands from armrests to RW; one loss of balance during rise, requiring heavy mod assist to prevent a hard sit back to the recliner; incr time to regain balance    Ambulation/Gait Ambulation/Gait assistance: Min assist Gait Distance (Feet): 5 Feet (to bed from recliner, including turning 180 degrees to sit to bed) Assistive device: Rolling walker (2 wheels) Gait Pattern/deviations: Decreased stance time - right, Decreased step length - right, Decreased step length - left, Trunk flexed       General Gait Details: Cues for sequence and to bear down on RW to take pressure off of painful R knee in stance; Nausea limited gait distance this session   Stairs             Wheelchair Mobility    Modified Rankin (Stroke Patients Only)       Balance     Sitting balance-Leahy Scale: Good       Standing balance-Leahy Scale: Poor                              Cognition Arousal/Alertness: Awake/alert Behavior During Therapy: WFL for tasks assessed/performed Overall Cognitive Status: Within Functional Limits for tasks assessed  Exercises Total Joint Exercises Goniometric ROM: aprppox 1-50 degrees    General Comments General comments (skin integrity, edema, etc.): Feeling malaise and nausea during session      Pertinent Vitals/Pain Pain Assessment Pain Assessment: 0-10 Pain Score: 7  Pain Location: R knee Pain Descriptors / Indicators: Aching Pain Intervention(s): Monitored during session    Home Living                          Prior Function             PT Goals (current goals can now be found in the care plan section) Acute Rehab PT Goals Patient Stated Goal: to go to short term rehab, return to independence PT Goal Formulation: With patient Time For Goal Achievement: 09/19/22 Potential to Achieve Goals: Good Progress towards PT goals: Progressing toward goals (slowly)    Frequency    7X/week      PT Plan Current plan remains appropriate    Co-evaluation              AM-PAC PT "6 Clicks" Mobility   Outcome Measure  Help needed turning from your back to your side while in a flat bed without using bedrails?: A Little Help needed moving from lying on your back to sitting on the side of a flat bed without using bedrails?: A Little Help needed moving to and from a bed to a chair (including a wheelchair)?: A Lot Help needed standing up from a chair using your arms (e.g., wheelchair or bedside chair)?: A Lot Help needed to walk in hospital room?: A Lot Help needed climbing 3-5 steps with a railing? : Total 6 Click Score: 13    End of Session Equipment Utilized During Treatment: Right knee immobilizer;Gait belt Activity Tolerance: Other (comment) (limited by nausea) Patient left: in bed;with call bell/phone within reach;with family/visitor present Nurse Communication: Mobility status;Other (comment) (Nausea) PT Visit Diagnosis: Other abnormalities of gait and mobility (R26.89);Muscle weakness (generalized) (M62.81);Pain Pain - Right/Left: Right Pain - part of body: Knee     Time: 4098-1191 PT Time Calculation (min) (ACUTE ONLY): 36 min  Charges:  $Gait Training: 8-22 mins $Therapeutic Activity: 8-22 mins                     Van Clines, PT  Acute Rehabilitation Services Office [phone]    Jessica [name] 09/13/2022, 5:41 PM

## 2022-09-14 LAB — GLUCOSE, CAPILLARY
Glucose-Capillary: 180 mg/dL — ABNORMAL HIGH (ref 70–99)
Glucose-Capillary: 283 mg/dL — ABNORMAL HIGH (ref 70–99)
Glucose-Capillary: 204 mg/dL — ABNORMAL HIGH (ref 70–99)
Glucose-Capillary: 168 mg/dL — ABNORMAL HIGH (ref 70–99)

## 2022-09-14 NOTE — Discharge Summary (Signed)
Patient ID: Jessica [name] MRN: [id number] DOB/AGE: December 11, 1950 72 y.o.  Admit date: 09/12/2022 Discharge date: 09/14/2022  Admission Diagnoses:  Principal Problem:   Unilateral primary osteoarthritis, right knee Active Problems:   Status post total right knee replacement   S/P TKR (total knee replacement)   Discharge Diagnoses:  Same  Past Medical History:  Diagnosis Date   Anxiety    Arthritis    bilateral knees   Asthma    childhood, inhaler for wheezing PRN   Autonomic neuropathy    diabetic   Cataract    Celiac disease    Constipation    Depression    Diabetes mellitus    type II, Hemoglobin A1C 9.9 10/05/2011   Diabetic neuropathy (HCC)    Diverticulosis    Fatty liver    Gastroparesis    GERD (gastroesophageal reflux disease)    History of claustrophobia    with MRI tests   Hyperlipidemia    Hypertension    IBS (irritable bowel syndrome)    Kidney stones    Personal history of colonic polyps 03/2010   hyperplastic   PONV (postoperative nausea and vomiting)    Shingles 2021    Surgeries: Procedure(s): RIGHT TOTAL KNEE ARTHROPLASTY on 09/12/2022   Consultants:   Discharged Condition: Improved  Hospital Course: Jessica [name] is an 72 y.o. female who was admitted 09/12/2022 for operative treatment ofUnilateral primary osteoarthritis, right knee. Patient has severe unremitting pain that affects sleep, daily activities, and work/hobbies. After pre-op clearance the patient was taken to the operating room on 09/12/2022 and underwent  Procedure(s): RIGHT TOTAL KNEE ARTHROPLASTY.    Patient was given perioperative antibiotics:  Anti-infectives (From admission, onward)    Start     Dose/Rate Route Frequency Ordered Stop   09/13/22 1000  valACYclovir (VALTREX) tablet 500 mg        500 mg Oral Daily 09/12/22 1200     09/12/22 1330  ceFAZolin (ANCEF) IVPB 1 g/50 mL premix        1 g 100 mL/hr over 30 Minutes Intravenous Every 6 hours 09/12/22 1148 09/12/22 1952    09/12/22 0600  ceFAZolin (ANCEF) IVPB 2g/100 mL premix        2 g 200 mL/hr over 30 Minutes Intravenous On call to O.R. 09/12/22 4098 09/12/22 0806        Patient was given sequential compression devices, early ambulation, and chemoprophylaxis to prevent DVT.  Patient benefited maximally from hospital stay and there were no complications.    Recent vital signs: Patient Vitals for the past 24 hrs:  BP Temp Temp src Pulse Resp SpO2  09/14/22 0722 (!) 163/71 99.1 F (37.3 C) Oral (!) 109 17 97 %  09/14/22 0359 (!) 176/68 98.3 F (36.8 C) -- 99 16 98 %  09/13/22 2216 (!) 198/57 99.8 F (37.7 C) Oral (!) 106 18 100 %  09/13/22 1311 (!) 165/65 97.6 F (36.4 C) Oral 99 18 98 %     Recent laboratory studies:  Recent Labs    09/13/22 0624  WBC 7.7  HGB 10.9*  HCT 31.8*  PLT 203  NA 136  K 4.4  CL 104  CO2 25  BUN 15  CREATININE 0.93  GLUCOSE 217*  CALCIUM 8.7*     Discharge Medications:   Allergies as of 09/14/2022       Reactions   Codeine Nausea And Vomiting   Gluten Meal Other (See Comments)   No wheat or soy   Metronidazole  Nausea And Vomiting   Nsaids Nausea And Vomiting, Other (See Comments)   Cannot tolerate large doses d/t Celiac disease   Soy Allergy    Wheat         Medication List     STOP taking these medications    aspirin EC 81 MG tablet   ibuprofen 200 MG tablet Commonly known as: ADVIL   tiZANidine 2 MG tablet Commonly known as: ZANAFLEX       TAKE these medications    albuterol 108 (90 Base) MCG/ACT inhaler Commonly known as: VENTOLIN HFA TAKE 2 PUFFS BY MOUTH EVERY 6 HOURS AS NEEDED FOR WHEEZE OR SHORTNESS OF BREATH   apixaban 2.5 MG Tabs tablet Commonly known as: ELIQUIS Take 1 tablet (2.5 mg total) by mouth every 12 (twelve) hours.   atorvastatin 40 MG tablet Commonly known as: LIPITOR TAKE 1 TABLET BY MOUTH EVERY DAY   dexlansoprazole 60 MG capsule Commonly known as: DEXILANT TAKE 1 CAPSULE BY MOUTH EVERY DAY    DULoxetine 20 MG capsule Commonly known as: CYMBALTA TAKE 1 CAPSULE BY MOUTH EVERY DAY   furosemide 20 MG tablet Commonly known as: LASIX TAKE 1 TABLET (20 MG TOTAL) BY MOUTH DAILY AS NEEDED FOR FLUID OR EDEMA. What changed: See the new instructions.   gabapentin 600 MG tablet Commonly known as: NEURONTIN TAKE 1 TABLET IN THE MORNING AND 2 TABLETS AT BEDTIME   insulin lispro 100 UNIT/ML KwikPen Commonly known as: HumaLOG KwikPen Inject 5-9 Units into the skin 2 (two) times daily before a meal. What changed: how much to take   Insulin Pen Needle 32G X 4 MM Misc Use 3-4x a day   Insulin Syringe-Needle U-100 31G X 5/16" 1 ML Misc Commonly known as: BD Insulin Syringe U/F USE UP TO 3 TIMES A DAY   ketoconazole 2 % cream Commonly known as: NIZORAL APPLY TWICE A DAY FOR 2 WEEKS AS NEEDED FOR RASH   Lantus SoloStar 100 UNIT/ML Solostar Pen Generic drug: insulin glargine Inject 30 Units into the skin at bedtime.   methocarbamol 500 MG tablet Commonly known as: ROBAXIN Take 1 tablet (500 mg total) by mouth every 6 (six) hours as needed for muscle spasms.   olmesartan 40 MG tablet Commonly known as: BENICAR TAKE 1 TABLET BY MOUTH EVERY DAY   ondansetron 4 MG disintegrating tablet Commonly known as: Zofran ODT Take 1 tablet (4 mg total) by mouth every 8 (eight) hours as needed for nausea or vomiting.   OneTouch Ultra test strip Generic drug: glucose blood USE 2 TIMES DAILY. AND LANCETS 2/DAY   oxyCODONE 5 MG immediate release tablet Commonly known as: Oxy IR/ROXICODONE Take 1-2 tablets (5-10 mg total) by mouth every 4 (four) hours as needed for moderate pain (pain score 4-6).   polyethylene glycol powder 17 GM/SCOOP powder Commonly known as: [employer] Purelax DISSOLVE 1 CAPFUL IN AT LEAST 8 OUNCES WATER/JUICE AND DRINK TWICE DAILY What changed:  how much to take how to take this when to take this additional instructions   sucralfate 1 GM/10ML suspension Commonly known  as: CARAFATE Take 10 mLs (1 g total) by mouth 4 (four) times daily. What changed:  when to take this reasons to take this   Trulicity 0.75 MG/0.5ML Sopn Generic drug: Dulaglutide Inject into the skin weekly   valACYclovir 500 MG tablet Commonly known as: VALTREX TAKE 1 TABLET BY MOUTH EVERY DAY   Vitamin D (Ergocalciferol) 1.25 MG (50000 UNIT) Caps capsule Commonly known as: DRISDOL TAKE  1 CAPSULE (50,000 UNITS TOTAL) BY MOUTH EVERY 30 DAYS               Durable Medical Equipment  (From admission, onward)           Start     Ordered   09/12/22 1202  DME 3 n 1  Once        09/12/22 1201   09/12/22 1202  DME Walker rolling  Once       Question Answer Comment  Walker: With 5 Inch Wheels   Patient needs a walker to treat with the following condition Status post total right knee replacement      09/12/22 1201            Diagnostic Studies: DG Knee Right Port  Result Date: 09/12/2022 CLINICAL DATA:  [id number] Status post total right knee replacement [id number] EXAM: PORTABLE RIGHT KNEE - 1-2 VIEW COMPARISON:  Knee radiograph 05/29/2022 FINDINGS: Postsurgical changes of right total knee arthroplasty. Normal alignment. No evidence of loosening or periprosthetic fracture. Expected soft tissue changes. IMPRESSION: Postsurgical changes of right total knee arthroplasty. No evidence of immediate hardware complication. Electronically Signed   By: Caprice Renshaw M.D.   On: 09/12/2022 09:58    Disposition: Discharge disposition: 03-Skilled Nursing Facility       Discharge Instructions     Diet - low sodium heart healthy   Complete by: As directed    Increase activity slowly   Complete by: As directed         Follow-up Information     Kathryne Hitch, MD Follow up in 2 week(s).   Specialty: Orthopedic Surgery Contact information: [address] [place] [address] [phone]                  Signed: Richardean Canal 09/14/2022, 11:32  AM

## 2022-09-14 NOTE — Progress Notes (Signed)
Physical Therapy Treatment Patient Details Name: Jessica [name] MRN: [id number] DOB: 1951-04-18 Today's Date: 09/14/2022   History of Present Illness 72 y.o. female presents to [facility] hospital on 09/12/2022 for elective R TKA. PMH includes anxiety, asthma, DM, depression, GERD, HLD, HTN, IBS.    PT Comments    Pt received in supine and agreeable to session. Pt continues to be limited by R knee pain this session, but is able to tolerate increased gait distance to the bathroom. Pt able to use a gait belt to advance RLE to sit EOB with increased time, but without physical assist. Pt demonstrating slow, antalgic gait, however improves pace and step length with progressed distance. Pt reporting slight nausea during session, however it does not limit mobility. Pt continues to benefit from PT services to progress toward functional mobility goals.     Recommendations for follow up therapy are one component of a multi-disciplinary discharge planning process, led by the attending physician.  Recommendations may be updated based on patient status, additional functional criteria and insurance authorization.     Assistance Recommended at Discharge Intermittent Supervision/Assistance  Patient can return home with the following A little help with walking and/or transfers;Assistance with cooking/housework;A lot of help with bathing/dressing/bathroom;Assist for transportation;Help with stairs or ramp for entrance   Equipment Recommendations  Rolling walker (2 wheels);BSC/3in1    Recommendations for Other Services       Precautions / Restrictions Precautions Precautions: Fall;Knee Precaution Booklet Issued: Yes (comment) Required Braces or Orthoses: Knee Immobilizer - Right Knee Immobilizer - Right: Other (comment) (not in orders) Restrictions Weight Bearing Restrictions: Yes RLE Weight Bearing: Weight bearing as tolerated     Mobility  Bed Mobility Overal bed mobility: Needs Assistance Bed Mobility:  Supine to Sit     Supine to sit: Min guard     General bed mobility comments: Pt requiring increased time and was able to use gait belt to advance RLE to EOB. Cues for step by step sequencing    Transfers Overall transfer level: Needs assistance Equipment used: Rolling walker (2 wheels) Transfers: Sit to/from Stand Sit to Stand: Mod assist           General transfer comment: From EOB and raised toilet seat with mod A for power up and cues for hand placement    Ambulation/Gait Ambulation/Gait assistance: Min guard Gait Distance (Feet): 10 Feet (+10) Assistive device: Rolling walker (2 wheels) Gait Pattern/deviations: Decreased stance time - right, Decreased step length - left, Trunk flexed, Step-to pattern, Antalgic     Pre-gait activities: weight shifting at EOB General Gait Details: Pt demonstrating a slow step-to pattern with cues for sequencing and upright posture. Pt not wearing KI with no evidence of R knee instability during ambulation. Cues for BUE support on RW to offload RLE    Balance Overall balance assessment: Needs assistance Sitting-balance support: No upper extremity supported, Feet supported Sitting balance-Leahy Scale: Good Sitting balance - Comments: sitting EOB   Standing balance support: Single extremity supported, Reliant on assistive device for balance, During functional activity Standing balance-Leahy Scale: Poor Standing balance comment: with RW support                            Cognition Arousal/Alertness: Awake/alert Behavior During Therapy: WFL for tasks assessed/performed Overall Cognitive Status: Within Functional Limits for tasks assessed  Exercises Total Joint Exercises Quad Sets: AROM, Right, 5 reps, Supine Heel Slides: AROM, AAROM, Supine, Right, 5 reps    General Comments General comments (skin integrity, edema, etc.): Pt reporting slight dizziness and nausea  once sitting EOB that improved throughout session      Pertinent Vitals/Pain Pain Assessment Pain Assessment: 0-10 Pain Score: 5  Pain Location: R knee Pain Descriptors / Indicators: Aching, Grimacing, Operative site guarding Pain Intervention(s): Monitored during session, Limited activity within patient's tolerance, Premedicated before session, Repositioned     PT Goals (current goals can now be found in the care plan section) Acute Rehab PT Goals Patient Stated Goal: to go to short term rehab, return to independence PT Goal Formulation: With patient Time For Goal Achievement: 09/19/22 Potential to Achieve Goals: Good Progress towards PT goals: Progressing toward goals    Frequency    7X/week      PT Plan Current plan remains appropriate       AM-PAC PT "6 Clicks" Mobility   Outcome Measure  Help needed turning from your back to your side while in a flat bed without using bedrails?: A Little Help needed moving from lying on your back to sitting on the side of a flat bed without using bedrails?: A Little Help needed moving to and from a bed to a chair (including a wheelchair)?: A Lot Help needed standing up from a chair using your arms (e.g., wheelchair or bedside chair)?: A Lot Help needed to walk in hospital room?: A Little Help needed climbing 3-5 steps with a railing? : Total 6 Click Score: 14    End of Session Equipment Utilized During Treatment: Gait belt Activity Tolerance: Patient limited by pain Patient left: in chair;with call bell/phone within reach Nurse Communication: Mobility status;Patient requests pain meds PT Visit Diagnosis: Other abnormalities of gait and mobility (R26.89);Muscle weakness (generalized) (M62.81);Pain Pain - Right/Left: Right Pain - part of body: Knee     Time: 2130-8657 PT Time Calculation (min) (ACUTE ONLY): 45 min  Charges:  $Gait Training: 23-37 mins $Therapeutic Activity: 8-22 mins                     Jessica [name],  PTA Acute Rehabilitation Services Secure Chat Preferred  [phone]    Jessica [name] 09/14/2022, 9:49 AM

## 2022-09-14 NOTE — Progress Notes (Signed)
Mobility Specialist Progress Note   09/14/22 0950  Mobility  Activity Stood at bedside;Repositioned in chair  Level of Assistance Minimal assist, patient does 75% or more  Assistive Device Front wheel walker  Range of Motion/Exercises Active;All extremities  RLE Weight Bearing WBAT  Activity Response Tolerated well   Patient received in recliner requesting assistance. Completed STS x1 and repositioned in chair with min A. Tolerated without incident. Was left in recliner with all needs met, call bell in reach.   [place] Ruston Fedora, BS EXP Mobility Specialist Please contact via SecureChat or Rehab office at [phone]

## 2022-09-14 NOTE — Progress Notes (Signed)
Subjective: 2 Days Post-Op Procedure(s) (LRB): RIGHT TOTAL KNEE ARTHROPLASTY (Right) Patient reports pain as moderate.    Objective: Vital signs in last 24 hours: Temp:  [97.6 F (36.4 C)-99.8 F (37.7 C)] 99.1 F (37.3 C) (05/16 0722) Pulse Rate:  [99-109] 109 (05/16 0722) Resp:  [16-18] 17 (05/16 0722) BP: (163-198)/(57-71) 163/71 (05/16 0722) SpO2:  [97 %-100 %] 97 % (05/16 0722)  Intake/Output from previous day: 05/15 0701 - 05/16 0700 In: 200 [P.O.:200] Out: 1400 [Urine:1400] Intake/Output this shift: No intake/output data recorded.  Recent Labs    09/13/22 0624  HGB 10.9*   Recent Labs    09/13/22 0624  WBC 7.7  RBC 3.29*  HCT 31.8*  PLT 203   Recent Labs    09/13/22 0624  NA 136  K 4.4  CL 104  CO2 25  BUN 15  CREATININE 0.93  GLUCOSE 217*  CALCIUM 8.7*   No results for input(s): "LABPT", "INR" in the last 72 hours.  Dorsiflexion/Plantar flexion intact Incision: scant drainage Compartment soft   Assessment/Plan: 2 Days Post-Op Procedure(s) (LRB): RIGHT TOTAL KNEE ARTHROPLASTY (Right) Up with therapy Discharge to SNF when bed available.       Zuriel Roskos 09/14/2022, 11:30 AM

## 2022-09-14 NOTE — Evaluation (Signed)
Occupational Therapy Evaluation Patient Details Name: Jessica [name] MRN: [id number] DOB: May 08, 1950 Today's Date: 09/14/2022   History of Present Illness 72 y.o. female presents to [facility] hospital on 09/12/2022 for elective R TKA. PMH includes anxiety, asthma, DM, depression, GERD, HLD, HTN, IBS.   Clinical Impression   PTA pt lives at home independently with her husband. Pt demonstrates a functional decline, requiring min A with short distance ambulation and mod to Max A with ADL tasks @ RW level. Pt would greatly benefit from intensive inpatient follow up therapy, >3 hours/day to maximize functional level of independence and facilitate a safe DC home. Pt very motivated to participate with therapy and return home. Will follow.      Recommendations for follow up therapy are one component of a multi-disciplinary discharge planning process, led by the attending physician.  Recommendations may be updated based on patient status, additional functional criteria and insurance authorization.   Assistance Recommended at Discharge Frequent or constant Supervision/Assistance  Patient can return home with the following A lot of help with walking and/or transfers;A lot of help with bathing/dressing/bathroom;Assist for transportation;Help with stairs or ramp for entrance    Functional Status Assessment  Patient has had a recent decline in their functional status and demonstrates the ability to make significant improvements in function in a reasonable and predictable amount of time.  Equipment Recommendations  BSC/3in1    Recommendations for Other Services       Precautions / Restrictions Precautions Precautions: Fall;Knee Precaution Booklet Issued: Yes (comment) Precaution Comments: KI Required Braces or Orthoses: Knee Immobilizer - Right Knee Immobilizer - Right: Other (comment) (not in orders) Restrictions Weight Bearing Restrictions: Yes RLE Weight Bearing: Weight bearing as tolerated       Mobility Bed Mobility               General bed mobility comments: OOB in  chair    Transfers Overall transfer level: Needs assistance Equipment used: Rolling walker (2 wheels) Transfers: Sit to/from Stand Sit to Stand: Min assist  Good anterior weight shift with increased time; mod VC for positioning   Step pivot transfers: Min assist            Balance Overall balance assessment: Needs assistance Sitting-balance support: No upper extremity supported, Feet supported Sitting balance-Leahy Scale: Good     Standing balance support: Single extremity supported, Reliant on assistive device for balance, During functional activity Standing balance-Leahy Scale: Poor Standing balance comment: with RW support                           ADL either performed or assessed with clinical judgement   ADL Overall ADL's : Needs assistance/impaired     Grooming: Set up;Sitting (difficulty releasing RW in standing)   Upper Body Bathing: Set up;Sitting   Lower Body Bathing: Moderate assistance;Sit to/from stand   Upper Body Dressing : Set up;Sitting   Lower Body Dressing: Maximal assistance;Sit to/from stand   Toilet Transfer: Minimal assistance;BSC/3in1;Rolling walker (2 wheels);Stand-pivot   Toileting- Architect and Hygiene: Moderate assistance       Functional mobility during ADLs: Minimal assistance;Rolling walker (2 wheels);Cueing for sequencing;Cueing for safety (less than 5 feet) Would benefit from use of AE for LB ADL       Vision Baseline Vision/History: 1 Wears glasses Ability to See in Adequate Light:  (all of the time)       Perception     Praxis  Pertinent Vitals/Pain Pain Assessment Pain Assessment: 0-10 Pain Score: 4  Pain Location: R knee Pain Descriptors / Indicators: Aching, Grimacing, Operative site guarding Pain Intervention(s): Limited activity within patient's tolerance, Premedicated before session     Hand  Dominance     Extremity/Trunk Assessment Upper Extremity Assessment Upper Extremity Assessment:  (Hx of dislocated shoulder on r form a few years ago but no problems functionally)   Lower Extremity Assessment RLE Deficits / Details: post-op ROM limitations as expected s/p TKA. Pt performs SLR with increase effort, no quad lag although significant dressing around knee RLE Sensation: decreased light touch   Cervical / Trunk Assessment Cervical / Trunk Assessment: Normal   Communication Communication Communication: No difficulties   Cognition Arousal/Alertness: Awake/alert Behavior During Therapy: WFL for tasks assessed/performed Overall Cognitive Status: Within Functional Limits for tasks assessed; very appreciative                                       General Comments  edematous RLE    Exercises     Shoulder Instructions      Home Living Family/patient expects to be discharged to:: Private residence Living Arrangements: Spouse/significant other Available Help at Discharge: Family;Available PRN/intermittently (spouse is unable to physically assist) Type of Home: House Home Access: Ramped entrance     Home Layout: Able to live on main level with bedroom/bathroom     Bathroom Shower/Tub: Producer, television/film/video: Handicapped height Bathroom Accessibility: Yes   Home Equipment: Agricultural consultant (2 wheels);Cane - single point;Shower seat - built in;Grab bars - tub/shower          Prior Functioning/Environment Prior Level of Function : Independent/Modified Independent;Driving                        OT Problem List: Decreased strength;Decreased range of motion;Impaired balance (sitting and/or standing);Decreased knowledge of use of DME or AE;Obesity;Pain      OT Treatment/Interventions: Self-care/ADL training;Therapeutic exercise;DME and/or AE instruction;Therapeutic activities;Patient/family education;Balance training    OT  Goals(Current goals can be found in the care plan section) Acute Rehab OT Goals Patient Stated Goal: to get rehab before going home OT Goal Formulation: With patient Time For Goal Achievement: 09/28/22 Potential to Achieve Goals: Good  OT Frequency: Min 2X/week    Co-evaluation              AM-PAC OT "6 Clicks" Daily Activity     Outcome Measure Help from another person eating meals?: None Help from another person taking care of personal grooming?: A Little Help from another person toileting, which includes using toliet, bedpan, or urinal?: A Lot Help from another person bathing (including washing, rinsing, drying)?: A Lot Help from another person to put on and taking off regular upper body clothing?: A Little Help from another person to put on and taking off regular lower body clothing?: A Lot 6 Click Score: 16   End of Session Equipment Utilized During Treatment: Gait belt;Rolling walker (2 wheels) Nurse Communication: Mobility status  Activity Tolerance: Patient tolerated treatment well Patient left: in chair;with call bell/phone within reach;with chair alarm set  OT Visit Diagnosis: Unsteadiness on feet (R26.81);Other abnormalities of gait and mobility (R26.89);Muscle weakness (generalized) (M62.81);Pain Pain - Right/Left: Right Pain - part of body: Knee                Time: 1610-9604 OT Time  Calculation (min): 14 min Charges:  OT General Charges $OT Visit: 1 Visit OT Evaluation $OT Eval Low Complexity: 1 Low  Navi Erber, OT/L   Acute OT Clinical Specialist Acute Rehabilitation Services Pager [phone] Office [phone]   [facility] 09/14/2022, 12:14 PM

## 2022-09-14 NOTE — Progress Notes (Signed)
Physical Therapy Treatment Patient Details Name: Jessica [name] MRN: [id number] DOB: 19-Sep-1950 Today's Date: 09/14/2022   History of Present Illness 72 y.o. female presents to [facility] hospital on 09/12/2022 for elective R TKA. PMH includes anxiety, asthma, DM, depression, GERD, HLD, HTN, IBS.    PT Comments    Pt received in supine and agreeable to session. Pt reporting increased pain this session limiting activity tolerance. Pt able to stand and perform short gait distance to recliner. Pt able to perform 2 standing trials from recliner with focus on safe hand placement and anterior weight shift progressing to min guard. Pt performing a second short gait distance back to bed and required min A to elevate RLE back to bed due to pain. Pt continues to benefit from PT services to progress toward functional mobility goals.     Recommendations for follow up therapy are one component of a multi-disciplinary discharge planning process, led by the attending physician.  Recommendations may be updated based on patient status, additional functional criteria and insurance authorization.     Assistance Recommended at Discharge Intermittent Supervision/Assistance  Patient can return home with the following A little help with walking and/or transfers;Assistance with cooking/housework;A lot of help with bathing/dressing/bathroom;Assist for transportation;Help with stairs or ramp for entrance   Equipment Recommendations  Rolling walker (2 wheels);BSC/3in1    Recommendations for Other Services       Precautions / Restrictions Precautions Precautions: Fall;Knee Precaution Booklet Issued: Yes (comment) Required Braces or Orthoses: Knee Immobilizer - Right Knee Immobilizer - Right: Other (comment) (not in orders) Restrictions Weight Bearing Restrictions: Yes RLE Weight Bearing: Weight bearing as tolerated     Mobility  Bed Mobility Overal bed mobility: Needs Assistance Bed Mobility: Supine to Sit, Sit to  Supine     Supine to sit: Min guard Sit to supine: Min assist   General bed mobility comments: Min A for RLE elevation to EOB. Pt using gait belt to manage RLE out of and into bed.    Transfers Overall transfer level: Needs assistance Equipment used: Rolling walker (2 wheels) Transfers: Sit to/from Stand Sit to Stand: Min assist, Min guard           General transfer comment: From EOB x1 with min A for power up and from recliner x2 with min guard and cues for hand placement    Ambulation/Gait Ambulation/Gait assistance: Min guard Gait Distance (Feet): 5 Feet (+5) Assistive device: Rolling walker (2 wheels) Gait Pattern/deviations: Decreased stance time - right, Decreased step length - left, Trunk flexed, Step-to pattern, Antalgic     Pre-gait activities: weight shifting at EOB General Gait Details: Pt walking short distance from bed<>recliner with slow antalgic steps. Min guard for safety.       Balance Overall balance assessment: Needs assistance Sitting-balance support: No upper extremity supported, Feet supported Sitting balance-Leahy Scale: Good Sitting balance - Comments: sitting EOB   Standing balance support: Single extremity supported, Reliant on assistive device for balance, During functional activity Standing balance-Leahy Scale: Poor Standing balance comment: with RW support                            Cognition Arousal/Alertness: Awake/alert Behavior During Therapy: WFL for tasks assessed/performed Overall Cognitive Status: Within Functional Limits for tasks assessed  Exercises Total Joint Exercises Quad Sets: AROM, Right, 5 reps, Supine Heel Slides: AROM, AAROM, Supine, Right, 5 reps    General Comments        Pertinent Vitals/Pain Pain Assessment Pain Assessment: 0-10 Pain Score: 6  Pain Location: R knee Pain Descriptors / Indicators: Aching, Grimacing, Operative site  guarding Pain Intervention(s): Monitored during session, Premedicated before session, Ice applied, Repositioned     PT Goals (current goals can now be found in the care plan section) Acute Rehab PT Goals Patient Stated Goal: to go to short term rehab, return to independence PT Goal Formulation: With patient Time For Goal Achievement: 09/19/22 Potential to Achieve Goals: Good Progress towards PT goals: Progressing toward goals (slowly)    Frequency    7X/week      PT Plan Current plan remains appropriate       AM-PAC PT "6 Clicks" Mobility   Outcome Measure  Help needed turning from your back to your side while in a flat bed without using bedrails?: None Help needed moving from lying on your back to sitting on the side of a flat bed without using bedrails?: A Little Help needed moving to and from a bed to a chair (including a wheelchair)?: A Little Help needed standing up from a chair using your arms (e.g., wheelchair or bedside chair)?: A Little Help needed to walk in hospital room?: A Little Help needed climbing 3-5 steps with a railing? : Total 6 Click Score: 17    End of Session Equipment Utilized During Treatment: Gait belt Activity Tolerance: Patient limited by pain Patient left: in bed;with call bell/phone within reach Nurse Communication: Mobility status;Patient requests pain meds PT Visit Diagnosis: Other abnormalities of gait and mobility (R26.89);Muscle weakness (generalized) (M62.81);Pain Pain - Right/Left: Right Pain - part of body: Knee     Time: 1610-9604 PT Time Calculation (min) (ACUTE ONLY): 31 min  Charges:  $Gait Training: 8-22 mins $Therapeutic Activity: 8-22 mins                     Johny Shock, PTA Acute Rehabilitation Services Secure Chat Preferred  [phone]    Johny Shock 09/14/2022, 3:50 PM

## 2022-09-14 NOTE — TOC Progression Note (Addendum)
Transition of Care [facility]) - Progression Note    Patient Details  Name: Jessica [name] MRN: [id number] Date of Birth: Jul 27, 1950  [facility]) CM/SW Contact  Lorri Frederick, LCSW Phone Number: 09/14/2022, 8:46 AM  Clinical Narrative:   TC [facility].  They have started insurance auth request and can accept pt once she is approved.  0900: TC [facility].  Insurance asking for OT eval.  MD notified.  1300: OT note faxed to [facility].    Expected Discharge Plan: Skilled Nursing Facility Barriers to Discharge: Continued Medical Work up, SNF Pending bed offer  Expected Discharge Plan and Services In-house Referral: Clinical Social Work   Post Acute Care Choice: Skilled Nursing Facility Living arrangements for the past 2 months: Single Family Home                                       Social Determinants of Health (SDOH) Interventions SDOH Screenings   Food Insecurity: No Food Insecurity (03/13/2022)  Housing: Low Risk  (03/13/2022)  Transportation Needs: No Transportation Needs (03/13/2022)  Depression (PHQ2-9): Low Risk  (04/12/2022)  Financial Resource Strain: Low Risk  (03/13/2022)  Physical Activity: Inactive (03/13/2022)  Social Connections: Moderately Integrated (03/13/2022)  Stress: Stress Concern Present (03/13/2022)  Tobacco Use: Low Risk  (09/13/2022)    Readmission Risk Interventions     No data to display

## 2022-09-14 NOTE — Plan of Care (Signed)
  Problem: Education: Goal: Knowledge of General Education information will improve Description: Including pain rating scale, medication(s)/side effects and non-pharmacologic comfort measures Outcome: Progressing   Problem: Health Behavior/Discharge Planning: Goal: Ability to manage health-related needs will improve Outcome: Progressing   Problem: Clinical Measurements: Goal: Ability to maintain clinical measurements within normal limits will improve Outcome: Progressing Goal: Will remain free from infection Outcome: Progressing Goal: Diagnostic test results will improve Outcome: Progressing Goal: Respiratory complications will improve Outcome: Progressing Goal: Cardiovascular complication will be avoided Outcome: Progressing   Problem: Activity: Goal: Risk for activity intolerance will decrease Outcome: Progressing   Problem: Nutrition: Goal: Adequate nutrition will be maintained Outcome: Progressing   Problem: Coping: Goal: Level of anxiety will decrease Outcome: Progressing   Problem: Elimination: Goal: Will not experience complications related to bowel motility Outcome: Progressing Goal: Will not experience complications related to urinary retention Outcome: Progressing   Problem: Pain Managment: Goal: General experience of comfort will improve Outcome: Progressing   Problem: Safety: Goal: Ability to remain free from injury will improve Outcome: Progressing   Problem: Skin Integrity: Goal: Risk for impaired skin integrity will decrease Outcome: Progressing   Problem: Education: Goal: Ability to describe self-care measures that may prevent or decrease complications (Diabetes Survival Skills Education) will improve Outcome: Progressing Goal: Individualized Educational Video(s) Outcome: Progressing   Problem: Coping: Goal: Ability to adjust to condition or change in health will improve Outcome: Progressing   Problem: Fluid Volume: Goal: Ability to  maintain a balanced intake and output will improve Outcome: Progressing   Problem: Health Behavior/Discharge Planning: Goal: Ability to identify and utilize available resources and services will improve Outcome: Progressing Goal: Ability to manage health-related needs will improve Outcome: Progressing   Problem: Metabolic: Goal: Ability to maintain appropriate glucose levels will improve Outcome: Progressing   Problem: Nutritional: Goal: Maintenance of adequate nutrition will improve Outcome: Progressing Goal: Progress toward achieving an optimal weight will improve Outcome: Progressing   Problem: Skin Integrity: Goal: Risk for impaired skin integrity will decrease Outcome: Progressing   Problem: Tissue Perfusion: Goal: Adequacy of tissue perfusion will improve Outcome: Progressing   Problem: Education: Goal: Knowledge of the prescribed therapeutic regimen will improve Outcome: Progressing Goal: Individualized Educational Video(s) Outcome: Progressing   Problem: Activity: Goal: Ability to avoid complications of mobility impairment will improve Outcome: Progressing Goal: Range of joint motion will improve Outcome: Progressing   Problem: Clinical Measurements: Goal: Postoperative complications will be avoided or minimized Outcome: Progressing   Problem: Pain Management: Goal: Pain level will decrease with appropriate interventions Outcome: Progressing   Problem: Skin Integrity: Goal: Will show signs of wound healing Outcome: Progressing   

## 2022-09-15 LAB — GLUCOSE, CAPILLARY
Glucose-Capillary: 327 mg/dL — ABNORMAL HIGH (ref 70–99)
Glucose-Capillary: 135 mg/dL — ABNORMAL HIGH (ref 70–99)
Glucose-Capillary: 189 mg/dL — ABNORMAL HIGH (ref 70–99)

## 2022-09-15 NOTE — TOC Progression Note (Signed)
Transition of Care [facility]) - Progression Note    Patient Details  Name: Jessica [name] MRN: [id number] Date of Birth: [birth year]  Transition of Care [facility]) CM/SW Contact  Lorri Frederick, LCSW Phone Number: 09/15/2022, 9:39 AM  Clinical Narrative:   CSW spoke with [facility].  Auth still pending.  Everything has been submitted.  They will call when auth approved.  They can accept her over the weekend if approved then.    Expected Discharge Plan: Skilled Nursing Facility Barriers to Discharge: Continued Medical Work up, SNF Pending bed offer  Expected Discharge Plan and Services In-house Referral: Clinical Social Work   Post Acute Care Choice: Skilled Nursing Facility Living arrangements for the past 2 months: Single Family Home Expected Discharge Date: 09/15/22                                     Social Determinants of Health (SDOH) Interventions SDOH Screenings   Food Insecurity: No Food Insecurity (03/13/2022)  Housing: Low Risk  (03/13/2022)  Transportation Needs: No Transportation Needs (03/13/2022)  Depression (PHQ2-9): Low Risk  (04/12/2022)  Financial Resource Strain: Low Risk  (03/13/2022)  Physical Activity: Inactive (03/13/2022)  Social Connections: Moderately Integrated (03/13/2022)  Stress: Stress Concern Present (03/13/2022)  Tobacco Use: Low Risk  (09/13/2022)    Readmission Risk Interventions     No data to display

## 2022-09-15 NOTE — Progress Notes (Signed)
Subjective: 3 Days Post-Op Procedure(s) (LRB): RIGHT TOTAL KNEE ARTHROPLASTY (Right) Patient reports pain as moderate.    Objective: Vital signs in last 24 hours: Temp:  [99.1 F (37.3 C)-99.9 F (37.7 C)] 99.9 F (37.7 C) (05/16 1544) Pulse Rate:  [93-109] 104 (05/17 0405) Resp:  [16-17] 16 (05/17 0405) BP: (148-163)/(58-71) 159/58 (05/17 0405) SpO2:  [97 %-98 %] 98 % (05/17 0405)  Intake/Output from previous day: No intake/output data recorded. Intake/Output this shift: No intake/output data recorded.  Recent Labs    09/13/22 0624  HGB 10.9*   Recent Labs    09/13/22 0624  WBC 7.7  RBC 3.29*  HCT 31.8*  PLT 203   Recent Labs    09/13/22 0624  NA 136  K 4.4  CL 104  CO2 25  BUN 15  CREATININE 0.93  GLUCOSE 217*  CALCIUM 8.7*   No results for input(s): "LABPT", "INR" in the last 72 hours.  Sensation intact distally Intact pulses distally Dorsiflexion/Plantar flexion intact Incision: dressing C/D/I No cellulitis present Compartment soft   Assessment/Plan: 3 Days Post-Op Procedure(s) (LRB): RIGHT TOTAL KNEE ARTHROPLASTY (Right) Discharge to SNF      Kathryne Hitch 09/15/2022, 6:38 AM

## 2022-09-15 NOTE — Discharge Summary (Signed)
Patient ID: Jessica [name] MRN: [id number] DOB/AGE: 72-22-1952 72 y.o.  Admit date: 09/12/2022 Discharge date: 09/15/2022  Admission Diagnoses:  Principal Problem:   Unilateral primary osteoarthritis, right knee Active Problems:   Status post total right knee replacement   S/P TKR (total knee replacement)   Discharge Diagnoses:  Same  Past Medical History:  Diagnosis Date   Anxiety    Arthritis    bilateral knees   Asthma    childhood, inhaler for wheezing PRN   Autonomic neuropathy    diabetic   Cataract    Celiac disease    Constipation    Depression    Diabetes mellitus    type II, Hemoglobin A1C 9.9 10/05/2011   Diabetic neuropathy (HCC)    Diverticulosis    Fatty liver    Gastroparesis    GERD (gastroesophageal reflux disease)    History of claustrophobia    with MRI tests   Hyperlipidemia    Hypertension    IBS (irritable bowel syndrome)    Kidney stones    Personal history of colonic polyps 03/2010   hyperplastic   PONV (postoperative nausea and vomiting)    Shingles 2021    Surgeries: Procedure(s): RIGHT TOTAL KNEE ARTHROPLASTY on 09/12/2022   Consultants:   Discharged Condition: Improved  Hospital Course: Jessica [name] is an 72 y.o. female who was admitted 09/12/2022 for operative treatment ofUnilateral primary osteoarthritis, right knee. Patient has severe unremitting pain that affects sleep, daily activities, and work/hobbies. After pre-op clearance the patient was taken to the operating room on 09/12/2022 and underwent  Procedure(s): RIGHT TOTAL KNEE ARTHROPLASTY.    Patient was given perioperative antibiotics:  Anti-infectives (From admission, onward)    Start     Dose/Rate Route Frequency Ordered Stop   09/13/22 1000  valACYclovir (VALTREX) tablet 500 mg        500 mg Oral Daily 09/12/22 1200     09/12/22 1330  ceFAZolin (ANCEF) IVPB 1 g/50 mL premix        1 g 100 mL/hr over 30 Minutes Intravenous Every 6 hours 09/12/22 1148 09/12/22  1952   09/12/22 0600  ceFAZolin (ANCEF) IVPB 2g/100 mL premix        2 g 200 mL/hr over 30 Minutes Intravenous On call to O.R. 09/12/22 9629 09/12/22 0806        Patient was given sequential compression devices, early ambulation, and chemoprophylaxis to prevent DVT.  Patient benefited maximally from hospital stay and there were no complications.    Recent vital signs: Patient Vitals for the past 24 hrs:  BP Temp Temp src Pulse Resp SpO2  09/15/22 0405 (!) 159/58 -- -- (!) 104 16 98 %  09/14/22 1544 (!) 148/63 99.9 F (37.7 C) Oral 93 17 97 %  09/14/22 0722 (!) 163/71 99.1 F (37.3 C) Oral (!) 109 17 97 %     Recent laboratory studies:  Recent Labs    09/13/22 0624  WBC 7.7  HGB 10.9*  HCT 31.8*  PLT 203  NA 136  K 4.4  CL 104  CO2 25  BUN 15  CREATININE 0.93  GLUCOSE 217*  CALCIUM 8.7*     Discharge Medications:   Allergies as of 09/15/2022       Reactions   Codeine Nausea And Vomiting   Gluten Meal Other (See Comments)   No wheat or soy   Metronidazole Nausea And Vomiting   Nsaids Nausea And Vomiting, Other (See Comments)   Cannot tolerate large  doses d/t Celiac disease   Soy Allergy    Wheat         Medication List     STOP taking these medications    aspirin EC 81 MG tablet   ibuprofen 200 MG tablet Commonly known as: ADVIL   tiZANidine 2 MG tablet Commonly known as: ZANAFLEX       TAKE these medications    albuterol 108 (90 Base) MCG/ACT inhaler Commonly known as: VENTOLIN HFA TAKE 2 PUFFS BY MOUTH EVERY 6 HOURS AS NEEDED FOR WHEEZE OR SHORTNESS OF BREATH   apixaban 2.5 MG Tabs tablet Commonly known as: ELIQUIS Take 1 tablet (2.5 mg total) by mouth every 12 (twelve) hours.   atorvastatin 40 MG tablet Commonly known as: LIPITOR TAKE 1 TABLET BY MOUTH EVERY DAY   dexlansoprazole 60 MG capsule Commonly known as: DEXILANT TAKE 1 CAPSULE BY MOUTH EVERY DAY   DULoxetine 20 MG capsule Commonly known as: CYMBALTA TAKE 1 CAPSULE BY  MOUTH EVERY DAY   furosemide 20 MG tablet Commonly known as: LASIX TAKE 1 TABLET (20 MG TOTAL) BY MOUTH DAILY AS NEEDED FOR FLUID OR EDEMA. What changed: See the new instructions.   gabapentin 600 MG tablet Commonly known as: NEURONTIN TAKE 1 TABLET IN THE MORNING AND 2 TABLETS AT BEDTIME   insulin lispro 100 UNIT/ML KwikPen Commonly known as: HumaLOG KwikPen Inject 5-9 Units into the skin 2 (two) times daily before a meal. What changed: how much to take   Insulin Pen Needle 32G X 4 MM Misc Use 3-4x a day   Insulin Syringe-Needle U-100 31G X 5/16" 1 ML Misc Commonly known as: BD Insulin Syringe U/F USE UP TO 3 TIMES A DAY   ketoconazole 2 % cream Commonly known as: NIZORAL APPLY TWICE A DAY FOR 2 WEEKS AS NEEDED FOR RASH   Lantus SoloStar 100 UNIT/ML Solostar Pen Generic drug: insulin glargine Inject 30 Units into the skin at bedtime.   methocarbamol 500 MG tablet Commonly known as: ROBAXIN Take 1 tablet (500 mg total) by mouth every 6 (six) hours as needed for muscle spasms.   olmesartan 40 MG tablet Commonly known as: BENICAR TAKE 1 TABLET BY MOUTH EVERY DAY   ondansetron 4 MG disintegrating tablet Commonly known as: Zofran ODT Take 1 tablet (4 mg total) by mouth every 8 (eight) hours as needed for nausea or vomiting.   OneTouch Ultra test strip Generic drug: glucose blood USE 2 TIMES DAILY. AND LANCETS 2/DAY   oxyCODONE 5 MG immediate release tablet Commonly known as: Oxy IR/ROXICODONE Take 1-2 tablets (5-10 mg total) by mouth every 4 (four) hours as needed for moderate pain (pain score 4-6).   polyethylene glycol powder 17 GM/SCOOP powder Commonly known as: [employer] Purelax DISSOLVE 1 CAPFUL IN AT LEAST 8 OUNCES WATER/JUICE AND DRINK TWICE DAILY What changed:  how much to take how to take this when to take this additional instructions   sucralfate 1 GM/10ML suspension Commonly known as: CARAFATE Take 10 mLs (1 g total) by mouth 4 (four) times daily. What  changed:  when to take this reasons to take this   Trulicity 0.75 MG/0.5ML Sopn Generic drug: Dulaglutide Inject into the skin weekly   valACYclovir 500 MG tablet Commonly known as: VALTREX TAKE 1 TABLET BY MOUTH EVERY DAY   Vitamin D (Ergocalciferol) 1.25 MG (50000 UNIT) Caps capsule Commonly known as: DRISDOL TAKE 1 CAPSULE (50,000 UNITS TOTAL) BY MOUTH EVERY 30 DAYS  Durable Medical Equipment  (From admission, onward)           Start     Ordered   09/12/22 1202  DME 3 n 1  Once        09/12/22 1201   09/12/22 1202  DME Walker rolling  Once       Question Answer Comment  Walker: With 5 Inch Wheels   Patient needs a walker to treat with the following condition Status post total right knee replacement      09/12/22 1201            Diagnostic Studies: DG Knee Right Port  Result Date: 09/12/2022 CLINICAL DATA:  [id number] Status post total right knee replacement [id number] EXAM: PORTABLE RIGHT KNEE - 1-2 VIEW COMPARISON:  Knee radiograph 05/29/2022 FINDINGS: Postsurgical changes of right total knee arthroplasty. Normal alignment. No evidence of loosening or periprosthetic fracture. Expected soft tissue changes. IMPRESSION: Postsurgical changes of right total knee arthroplasty. No evidence of immediate hardware complication. Electronically Signed   By: Caprice Renshaw M.D.   On: 09/12/2022 09:58    Disposition: Discharge disposition: 03-Skilled Nursing Facility       Discharge Instructions     Diet - low sodium heart healthy   Complete by: As directed    Increase activity slowly   Complete by: As directed         Follow-up Information     Kathryne Hitch, MD Follow up in 2 week(s).   Specialty: Orthopedic Surgery Contact information: [address] [place] [address] [phone]                  Signed: Kathryne Hitch 09/15/2022, 6:39 AM

## 2022-09-15 NOTE — Progress Notes (Signed)
Physical Therapy Treatment Patient Details Name: Jessica [name] MRN: [id number] DOB: May 15, 1950 Today's Date: 09/15/2022   History of Present Illness 72 y.o. female presents to [facility] hospital on 09/12/2022 for elective R TKA. PMH includes anxiety, asthma, DM, depression, GERD, HLD, HTN, IBS.    PT Comments    Pt received sitting in the recliner and agreeable to session. Pt reporting increased pain with mobility, limiting activity tolerance. Pt requesting to use the bathroom at the beginning of the session and was able to ambulate with min guard for safety. Pt requesting to wear the R KI this session for comfort. Pt demonstrating slow, antalgic steps with limited RLE WB. Pt deferred further ambulation this session due to increased pain. Pt continues to benefit from PT services to progress toward functional mobility goals.     Recommendations for follow up therapy are one component of a multi-disciplinary discharge planning process, led by the attending physician.  Recommendations may be updated based on patient status, additional functional criteria and insurance authorization.     Assistance Recommended at Discharge Intermittent Supervision/Assistance  Patient can return home with the following A little help with walking and/or transfers;Assistance with cooking/housework;A lot of help with bathing/dressing/bathroom;Assist for transportation;Help with stairs or ramp for entrance   Equipment Recommendations  Rolling walker (2 wheels);BSC/3in1    Recommendations for Other Services       Precautions / Restrictions Precautions Precautions: Fall;Knee Precaution Booklet Issued: Yes (comment) Required Braces or Orthoses: Knee Immobilizer - Right Restrictions Weight Bearing Restrictions: Yes RLE Weight Bearing: Weight bearing as tolerated     Mobility  Bed Mobility Overal bed mobility: Needs Assistance Bed Mobility: Sit to Supine       Sit to supine: Min assist   General bed mobility  comments: Min A for RLE elevation to EOB. Pt using gait belt to manage RLE, but unable to fully elevate without assist    Transfers Overall transfer level: Needs assistance Equipment used: Rolling walker (2 wheels) Transfers: Sit to/from Stand Sit to Stand: Min guard, Min assist           General transfer comment: From recliner with min guard and low toilet seat with min A. Cues for hand and RLE placement    Ambulation/Gait Ambulation/Gait assistance: Min guard Gait Distance (Feet): 10 Feet (x2) Assistive device: Rolling walker (2 wheels) Gait Pattern/deviations: Decreased stance time - right, Decreased step length - left, Trunk flexed, Step-to pattern, Antalgic       General Gait Details: From recliner to bathroom and back to bed with limited RLE WB and heavy reliance on BUE for offloading.      Balance Overall balance assessment: Needs assistance Sitting-balance support: No upper extremity supported, Feet supported Sitting balance-Leahy Scale: Good Sitting balance - Comments: sitting EOB and in recliner   Standing balance support: Single extremity supported, Reliant on assistive device for balance, During functional activity Standing balance-Leahy Scale: Poor Standing balance comment: with RW support                            Cognition Arousal/Alertness: Awake/alert Behavior During Therapy: WFL for tasks assessed/performed Overall Cognitive Status: Within Functional Limits for tasks assessed                                          Exercises      General  Comments        Pertinent Vitals/Pain Pain Assessment Pain Assessment: 0-10 Pain Score: 8  Pain Location: R knee with mobility Pain Descriptors / Indicators: Aching, Grimacing, Operative site guarding Pain Intervention(s): Monitored during session, Premedicated before session, Repositioned     PT Goals (current goals can now be found in the care plan section) Acute Rehab PT  Goals Patient Stated Goal: to go to short term rehab, return to independence PT Goal Formulation: With patient Time For Goal Achievement: 09/19/22 Potential to Achieve Goals: Good Progress towards PT goals: Progressing toward goals (slowly due to pain)    Frequency    7X/week      PT Plan Current plan remains appropriate       AM-PAC PT "6 Clicks" Mobility   Outcome Measure  Help needed turning from your back to your side while in a flat bed without using bedrails?: None Help needed moving from lying on your back to sitting on the side of a flat bed without using bedrails?: A Little Help needed moving to and from a bed to a chair (including a wheelchair)?: A Little Help needed standing up from a chair using your arms (e.g., wheelchair or bedside chair)?: A Little Help needed to walk in hospital room?: A Little Help needed climbing 3-5 steps with a railing? : Total 6 Click Score: 17    End of Session Equipment Utilized During Treatment: Gait belt Activity Tolerance: Patient limited by pain Patient left: in bed;with call bell/phone within reach Nurse Communication: Mobility status PT Visit Diagnosis: Other abnormalities of gait and mobility (R26.89);Muscle weakness (generalized) (M62.81);Pain Pain - Right/Left: Right Pain - part of body: Knee     Time: 1610-9604 PT Time Calculation (min) (ACUTE ONLY): 25 min  Charges:  $Gait Training: 8-22 mins $Therapeutic Activity: 8-22 mins                     Johny Shock, PTA Acute Rehabilitation Services Secure Chat Preferred  [phone]    Johny Shock 09/15/2022, 9:05 AM

## 2022-09-15 NOTE — Discharge Summary (Signed)
Patient ID: Jessica [name] MRN: [id number] DOB/AGE: Sep 26, 1950 72 y.o.  Admit date: 09/12/2022 Discharge date: 09/15/2022  Admission Diagnoses:  Principal Problem:   Unilateral primary osteoarthritis, right knee Active Problems:   Status post total right knee replacement   S/P TKR (total knee replacement)   Discharge Diagnoses:  Same  Past Medical History:  Diagnosis Date   Anxiety    Arthritis    bilateral knees   Asthma    childhood, inhaler for wheezing PRN   Autonomic neuropathy    diabetic   Cataract    Celiac disease    Constipation    Depression    Diabetes mellitus    type II, Hemoglobin A1C 9.9 10/05/2011   Diabetic neuropathy (HCC)    Diverticulosis    Fatty liver    Gastroparesis    GERD (gastroesophageal reflux disease)    History of claustrophobia    with MRI tests   Hyperlipidemia    Hypertension    IBS (irritable bowel syndrome)    Kidney stones    Personal history of colonic polyps 03/2010   hyperplastic   PONV (postoperative nausea and vomiting)    Shingles 2021    Surgeries: Procedure(s): RIGHT TOTAL KNEE ARTHROPLASTY on 09/12/2022   Consultants:   Discharged Condition: Improved  Hospital Course: Jessica [name] is an 72 y.o. female who was admitted 09/12/2022 for operative treatment ofUnilateral primary osteoarthritis, right knee. Patient has severe unremitting pain that affects sleep, daily activities, and work/hobbies. After pre-op clearance the patient was taken to the operating room on 09/12/2022 and underwent  Procedure(s): RIGHT TOTAL KNEE ARTHROPLASTY.    Patient was given perioperative antibiotics:  Anti-infectives (From admission, onward)    Start     Dose/Rate Route Frequency Ordered Stop   09/13/22 1000  valACYclovir (VALTREX) tablet 500 mg        500 mg Oral Daily 09/12/22 1200     09/12/22 1330  ceFAZolin (ANCEF) IVPB 1 g/50 mL premix        1 g 100 mL/hr over 30 Minutes Intravenous Every 6 hours 09/12/22 1148 09/12/22  1952   09/12/22 0600  ceFAZolin (ANCEF) IVPB 2g/100 mL premix        2 g 200 mL/hr over 30 Minutes Intravenous On call to O.R. 09/12/22 7829 09/12/22 0806        Patient was given sequential compression devices, early ambulation, and chemoprophylaxis to prevent DVT.  Patient benefited maximally from hospital stay and there were no complications.    Recent vital signs: Patient Vitals for the past 24 hrs:  BP Temp Temp src Pulse Resp SpO2  09/15/22 0405 (!) 159/58 -- -- (!) 104 16 98 %  09/14/22 1544 (!) 148/63 99.9 F (37.7 C) Oral 93 17 97 %  09/14/22 0722 (!) 163/71 99.1 F (37.3 C) Oral (!) 109 17 97 %     Recent laboratory studies:  Recent Labs    09/13/22 0624  WBC 7.7  HGB 10.9*  HCT 31.8*  PLT 203  NA 136  K 4.4  CL 104  CO2 25  BUN 15  CREATININE 0.93  GLUCOSE 217*  CALCIUM 8.7*     Discharge Medications:   Allergies as of 09/15/2022       Reactions   Codeine Nausea And Vomiting   Gluten Meal Other (See Comments)   No wheat or soy   Metronidazole Nausea And Vomiting   Nsaids Nausea And Vomiting, Other (See Comments)   Cannot tolerate large  doses d/t Celiac disease   Soy Allergy    Wheat         Medication List     STOP taking these medications    aspirin EC 81 MG tablet   ibuprofen 200 MG tablet Commonly known as: ADVIL   tiZANidine 2 MG tablet Commonly known as: ZANAFLEX       TAKE these medications    albuterol 108 (90 Base) MCG/ACT inhaler Commonly known as: VENTOLIN HFA TAKE 2 PUFFS BY MOUTH EVERY 6 HOURS AS NEEDED FOR WHEEZE OR SHORTNESS OF BREATH   apixaban 2.5 MG Tabs tablet Commonly known as: ELIQUIS Take 1 tablet (2.5 mg total) by mouth every 12 (twelve) hours.   atorvastatin 40 MG tablet Commonly known as: LIPITOR TAKE 1 TABLET BY MOUTH EVERY DAY   dexlansoprazole 60 MG capsule Commonly known as: DEXILANT TAKE 1 CAPSULE BY MOUTH EVERY DAY   DULoxetine 20 MG capsule Commonly known as: CYMBALTA TAKE 1 CAPSULE BY  MOUTH EVERY DAY   furosemide 20 MG tablet Commonly known as: LASIX TAKE 1 TABLET (20 MG TOTAL) BY MOUTH DAILY AS NEEDED FOR FLUID OR EDEMA. What changed: See the new instructions.   gabapentin 600 MG tablet Commonly known as: NEURONTIN TAKE 1 TABLET IN THE MORNING AND 2 TABLETS AT BEDTIME   insulin lispro 100 UNIT/ML KwikPen Commonly known as: HumaLOG KwikPen Inject 5-9 Units into the skin 2 (two) times daily before a meal. What changed: how much to take   Insulin Pen Needle 32G X 4 MM Misc Use 3-4x a day   Insulin Syringe-Needle U-100 31G X 5/16" 1 ML Misc Commonly known as: BD Insulin Syringe U/F USE UP TO 3 TIMES A DAY   ketoconazole 2 % cream Commonly known as: NIZORAL APPLY TWICE A DAY FOR 2 WEEKS AS NEEDED FOR RASH   Lantus SoloStar 100 UNIT/ML Solostar Pen Generic drug: insulin glargine Inject 30 Units into the skin at bedtime.   methocarbamol 500 MG tablet Commonly known as: ROBAXIN Take 1 tablet (500 mg total) by mouth every 6 (six) hours as needed for muscle spasms.   olmesartan 40 MG tablet Commonly known as: BENICAR TAKE 1 TABLET BY MOUTH EVERY DAY   ondansetron 4 MG disintegrating tablet Commonly known as: Zofran ODT Take 1 tablet (4 mg total) by mouth every 8 (eight) hours as needed for nausea or vomiting.   OneTouch Ultra test strip Generic drug: glucose blood USE 2 TIMES DAILY. AND LANCETS 2/DAY   oxyCODONE 5 MG immediate release tablet Commonly known as: Oxy IR/ROXICODONE Take 1-2 tablets (5-10 mg total) by mouth every 4 (four) hours as needed for moderate pain (pain score 4-6).   polyethylene glycol powder 17 GM/SCOOP powder Commonly known as: [employer]S Purelax DISSOLVE 1 CAPFUL IN AT LEAST 8 OUNCES WATER/JUICE AND DRINK TWICE DAILY What changed:  how much to take how to take this when to take this additional instructions   sucralfate 1 GM/10ML suspension Commonly known as: CARAFATE Take 10 mLs (1 g total) by mouth 4 (four) times daily. What  changed:  when to take this reasons to take this   Trulicity 0.75 MG/0.5ML Sopn Generic drug: Dulaglutide Inject into the skin weekly   valACYclovir 500 MG tablet Commonly known as: VALTREX TAKE 1 TABLET BY MOUTH EVERY DAY   Vitamin D (Ergocalciferol) 1.25 MG (50000 UNIT) Caps capsule Commonly known as: DRISDOL TAKE 1 CAPSULE (50,000 UNITS TOTAL) BY MOUTH EVERY 30 DAYS  Durable Medical Equipment  (From admission, onward)           Start     Ordered   09/12/22 1202  DME 3 n 1  Once        09/12/22 1201   09/12/22 1202  DME Walker rolling  Once       Question Answer Comment  Walker: With 5 Inch Wheels   Patient needs a walker to treat with the following condition Status post total right knee replacement      09/12/22 1201            Diagnostic Studies: DG Knee Right Port  Result Date: 09/12/2022 CLINICAL DATA:  [id number] Status post total right knee replacement [id number] EXAM: PORTABLE RIGHT KNEE - 1-2 VIEW COMPARISON:  Knee radiograph 05/29/2022 FINDINGS: Postsurgical changes of right total knee arthroplasty. Normal alignment. No evidence of loosening or periprosthetic fracture. Expected soft tissue changes. IMPRESSION: Postsurgical changes of right total knee arthroplasty. No evidence of immediate hardware complication. Electronically Signed   By: Caprice Renshaw M.D.   On: 09/12/2022 09:58    Disposition: Discharge disposition: 01-Home or Self Care       Discharge Instructions     Diet - low sodium heart healthy   Complete by: As directed    Increase activity slowly   Complete by: As directed         Follow-up Information     Kathryne Hitch, MD Follow up in 2 week(s).   Specialty: Orthopedic Surgery Contact information: [address] [place] [address] [phone]                  Signed: Kathryne Hitch 09/15/2022, 6:39 AM

## 2022-09-16 ENCOUNTER — Other Ambulatory Visit: Payer: Self-pay | Admitting: Family Medicine

## 2022-09-16 LAB — GLUCOSE, CAPILLARY
Glucose-Capillary: 281 mg/dL — ABNORMAL HIGH (ref 70–99)
Glucose-Capillary: 213 mg/dL — ABNORMAL HIGH (ref 70–99)
Glucose-Capillary: 166 mg/dL — ABNORMAL HIGH (ref 70–99)
Glucose-Capillary: 177 mg/dL — ABNORMAL HIGH (ref 70–99)

## 2022-09-16 NOTE — Plan of Care (Signed)
  Problem: Education: Goal: Knowledge of General Education information will improve Description: Including pain rating scale, medication(s)/side effects and non-pharmacologic comfort measures Outcome: Progressing   Problem: Health Behavior/Discharge Planning: Goal: Ability to manage health-related needs will improve Outcome: Progressing   Problem: Clinical Measurements: Goal: Ability to maintain clinical measurements within normal limits will improve Outcome: Progressing Goal: Will remain free from infection Outcome: Progressing Goal: Diagnostic test results will improve Outcome: Progressing Goal: Respiratory complications will improve Outcome: Progressing Goal: Cardiovascular complication will be avoided Outcome: Progressing   Problem: Activity: Goal: Risk for activity intolerance will decrease Outcome: Progressing   Problem: Nutrition: Goal: Adequate nutrition will be maintained Outcome: Progressing   Problem: Coping: Goal: Level of anxiety will decrease Outcome: Progressing   Problem: Elimination: Goal: Will not experience complications related to bowel motility Outcome: Progressing Goal: Will not experience complications related to urinary retention Outcome: Progressing   Problem: Pain Managment: Goal: General experience of comfort will improve Outcome: Progressing   Problem: Safety: Goal: Ability to remain free from injury will improve Outcome: Progressing   Problem: Skin Integrity: Goal: Risk for impaired skin integrity will decrease Outcome: Progressing   Problem: Education: Goal: Ability to describe self-care measures that may prevent or decrease complications (Diabetes Survival Skills Education) will improve Outcome: Progressing Goal: Individualized Educational Video(s) Outcome: Progressing   Problem: Coping: Goal: Ability to adjust to condition or change in health will improve Outcome: Progressing   Problem: Fluid Volume: Goal: Ability to  maintain a balanced intake and output will improve Outcome: Progressing   Problem: Health Behavior/Discharge Planning: Goal: Ability to identify and utilize available resources and services will improve Outcome: Progressing Goal: Ability to manage health-related needs will improve Outcome: Progressing   Problem: Metabolic: Goal: Ability to maintain appropriate glucose levels will improve Outcome: Progressing   Problem: Nutritional: Goal: Maintenance of adequate nutrition will improve Outcome: Progressing Goal: Progress toward achieving an optimal weight will improve Outcome: Progressing   Problem: Skin Integrity: Goal: Risk for impaired skin integrity will decrease Outcome: Progressing   Problem: Tissue Perfusion: Goal: Adequacy of tissue perfusion will improve Outcome: Progressing   Problem: Education: Goal: Knowledge of the prescribed therapeutic regimen will improve Outcome: Progressing Goal: Individualized Educational Video(s) Outcome: Progressing   Problem: Activity: Goal: Ability to avoid complications of mobility impairment will improve Outcome: Progressing Goal: Range of joint motion will improve Outcome: Progressing   Problem: Clinical Measurements: Goal: Postoperative complications will be avoided or minimized Outcome: Progressing   Problem: Pain Management: Goal: Pain level will decrease with appropriate interventions Outcome: Progressing   Problem: Skin Integrity: Goal: Will show signs of wound healing Outcome: Progressing   

## 2022-09-16 NOTE — Progress Notes (Signed)
Physical Therapy Treatment Patient Details Name: Jessica [name] MRN: [id number] DOB: Apr 10, 1951 Today's Date: 09/16/2022   History of Present Illness 72 y.o. female presents to [facility] hospital on 09/12/2022 for elective R TKA. PMH includes anxiety, asthma, DM, depression, GERD, HLD, HTN, IBS.    PT Comments    Pt supine in bed reports pain as 8/10 so started with slow ROM and bed level exercises.  Pt presents with poor ROM and educated on performing exercises 3 times/daily.  Pt resting in unsupported extension so rolled towel under her ankle at end of session to improve R knee extension.  Pt reports feeling dizzy this session and unable to ambulate BP in sitting 130/60, standing 107/62 and once returned to bed it read 129/64 informed nursing and applied ice man to R knee.     Recommendations for follow up therapy are one component of a multi-disciplinary discharge planning process, led by the attending physician.  Recommendations may be updated based on patient status, additional functional criteria and insurance authorization.  Follow Up Recommendations  Can patient physically be transported by private vehicle: No    Assistance Recommended at Discharge Intermittent Supervision/Assistance  Patient can return home with the following A little help with walking and/or transfers;Assistance with cooking/housework;A lot of help with bathing/dressing/bathroom;Assist for transportation;Help with stairs or ramp for entrance   Equipment Recommendations  Rolling walker (2 wheels);BSC/3in1    Recommendations for Other Services       Precautions / Restrictions Precautions Precautions: Fall;Knee Required Braces or Orthoses: Knee Immobilizer - Right Knee Immobilizer - Right: Other (comment) (not in orders - removed for tx) Restrictions Weight Bearing Restrictions: Yes RLE Weight Bearing: Weight bearing as tolerated     Mobility  Bed Mobility Overal bed mobility: Needs Assistance Bed Mobility:  Supine to Sit     Supine to sit: Min assist     General bed mobility comments: Min A for RLE elevation to EOB. Pt continues to benefit from increased time due to pain and discomfort.    Transfers Overall transfer level: Needs assistance Equipment used: Rolling walker (2 wheels) Transfers: Sit to/from Stand Sit to Stand: Min guard           General transfer comment: Cues for hand placement to and from seated surface. Pt rports dizziness in standing.  returned to seated surface and assessed BP, retrialed standing and noted a systolic drop of 23.    Ambulation/Gait Ambulation/Gait assistance:  (unable due to orthostatic hypotension and + symptoms.)                 Stairs             Wheelchair Mobility    Modified Rankin (Stroke Patients Only)       Balance Overall balance assessment: Needs assistance Sitting-balance support: No upper extremity supported, Feet supported Sitting balance-Leahy Scale: Good Sitting balance - Comments: sitting EOB and in recliner   Standing balance support: Single extremity supported, Reliant on assistive device for balance, During functional activity Standing balance-Leahy Scale: Poor                              Cognition Arousal/Alertness: Awake/alert Behavior During Therapy: WFL for tasks assessed/performed Overall Cognitive Status: Within Functional Limits for tasks assessed  Exercises Total Joint Exercises Ankle Circles/Pumps: AROM, Right, 20 reps, Supine Quad Sets: AROM, Right, Supine, 10 reps Heel Slides: AROM, AAROM, Supine, Right, 10 reps Hip ABduction/ADduction: AAROM, Right, 10 reps, Supine Straight Leg Raises: AAROM, Right, 10 reps, Supine Goniometric ROM: 6-44  Poor ROM this am.    General Comments        Pertinent Vitals/Pain Pain Assessment Pain Assessment: 0-10 Pain Score: 8  Pain Location: R knee with mobility Pain  Descriptors / Indicators: Aching, Grimacing, Operative site guarding Pain Intervention(s): Monitored during session, Repositioned    Home Living                          Prior Function            PT Goals (current goals can now be found in the care plan section) Acute Rehab PT Goals Patient Stated Goal: to go to short term rehab, return to independence Potential to Achieve Goals: Good Progress towards PT goals: Progressing toward goals    Frequency    7X/week      PT Plan Current plan remains appropriate    Co-evaluation              AM-PAC PT "6 Clicks" Mobility   Outcome Measure  Help needed turning from your back to your side while in a flat bed without using bedrails?: None Help needed moving from lying on your back to sitting on the side of a flat bed without using bedrails?: A Little Help needed moving to and from a bed to a chair (including a wheelchair)?: A Little Help needed standing up from a chair using your arms (e.g., wheelchair or bedside chair)?: A Little Help needed to walk in hospital room?: A Little Help needed climbing 3-5 steps with a railing? : Total 6 Click Score: 17    End of Session Equipment Utilized During Treatment: Gait belt Activity Tolerance: Patient limited by pain Patient left: in bed;with call bell/phone within reach;with nursing/sitter in room Nurse Communication: Mobility status;Other (comment) (+ orthostasis this sessison dropped from 130/62 and 107/60) PT Visit Diagnosis: Other abnormalities of gait and mobility (R26.89);Muscle weakness (generalized) (M62.81);Pain Pain - Right/Left: Right Pain - part of body: Knee     Time: 4098-1191 PT Time Calculation (min) (ACUTE ONLY): 35 min  Charges:  $Therapeutic Exercise: 8-22 mins $Therapeutic Activity: 8-22 mins                     Bonney Leitz , PTA Acute Rehabilitation Services Office [phone]    Florestine Avers 09/16/2022, 11:56 AM

## 2022-09-16 NOTE — TOC Progression Note (Addendum)
Transition of Care [facility]) - Progression Note    Patient Details  Name: Jessica [name] MRN: [id number] Date of Birth: Sep 23, 1950  [facility] CM/SW Contact  Donnalee Curry, LCSWA Phone Number: 09/16/2022, 2:52 PM  Clinical Narrative:     SW spoke with [facility] IPR [phone]) insurance requested P2P deadline: Monday 5/20 at 12pm [phone] Option 5. MV#[id number]   MD currently offline. SW sent secure chat to bedside RN to notify.  Update 348pm SW left message with on call answering service [phone]) to update provider regarding need for P2P  Expected Discharge Plan: Skilled Nursing Facility Barriers to Discharge: Continued Medical Work up, SNF Pending bed offer  Expected Discharge Plan and Services In-house Referral: Clinical Social Work   Post Acute Care Choice: Skilled Nursing Facility Living arrangements for the past 2 months: Single Family Home Expected Discharge Date: 09/16/22                                     Social Determinants of Health (SDOH) Interventions SDOH Screenings   Food Insecurity: No Food Insecurity (03/13/2022)  Housing: Low Risk  (03/13/2022)  Transportation Needs: No Transportation Needs (03/13/2022)  Depression (PHQ2-9): Low Risk  (04/12/2022)  Financial Resource Strain: Low Risk  (03/13/2022)  Physical Activity: Inactive (03/13/2022)  Social Connections: Moderately Integrated (03/13/2022)  Stress: Stress Concern Present (03/13/2022)  Tobacco Use: Low Risk  (09/13/2022)    Readmission Risk Interventions     No data to display

## 2022-09-16 NOTE — Discharge Summary (Signed)
Patient ID: Jessica [name] MRN: [id number] DOB/AGE: [birth year] 72 y.o.  Admit date: 09/12/2022 Discharge date: 09/16/2022  Admission Diagnoses:  Principal Problem:   Unilateral primary osteoarthritis, right knee Active Problems:   Status post total right knee replacement   S/P TKR (total knee replacement)   Discharge Diagnoses:  Same  Past Medical History:  Diagnosis Date   Anxiety    Arthritis    bilateral knees   Asthma    childhood, inhaler for wheezing PRN   Autonomic neuropathy    diabetic   Cataract    Celiac disease    Constipation    Depression    Diabetes mellitus    type II, Hemoglobin A1C 9.9 10/05/2011   Diabetic neuropathy (HCC)    Diverticulosis    Fatty liver    Gastroparesis    GERD (gastroesophageal reflux disease)    History of claustrophobia    with MRI tests   Hyperlipidemia    Hypertension    IBS (irritable bowel syndrome)    Kidney stones    Personal history of colonic polyps 03/2010   hyperplastic   PONV (postoperative nausea and vomiting)    Shingles 2021    Surgeries: Procedure(s): RIGHT TOTAL KNEE ARTHROPLASTY on 09/12/2022   Consultants:   Discharged Condition: Improved  Hospital Course: Jessica [name] is an 72 y.o. female who was admitted 09/12/2022 for operative treatment ofUnilateral primary osteoarthritis, right knee. Patient has severe unremitting pain that affects sleep, daily activities, and work/hobbies. After pre-op clearance the patient was taken to the operating room on 09/12/2022 and underwent  Procedure(s): RIGHT TOTAL KNEE ARTHROPLASTY.    Patient was given perioperative antibiotics:  Anti-infectives (From admission, onward)    Start     Dose/Rate Route Frequency Ordered Stop   09/13/22 1000  valACYclovir (VALTREX) tablet 500 mg        500 mg Oral Daily 09/12/22 1200     09/12/22 1330  ceFAZolin (ANCEF) IVPB 1 g/50 mL premix        1 g 100 mL/hr over 30 Minutes Intravenous Every 6 hours 09/12/22 1148 09/12/22  1952   09/12/22 0600  ceFAZolin (ANCEF) IVPB 2g/100 mL premix        2 g 200 mL/hr over 30 Minutes Intravenous On call to O.R. 09/12/22 4098 09/12/22 0806        Patient was given sequential compression devices, early ambulation, and chemoprophylaxis to prevent DVT.  Patient benefited maximally from hospital stay and there were no complications.    Recent vital signs: Patient Vitals for the past 24 hrs:  BP Temp Temp src Pulse Resp SpO2  09/16/22 0731 (!) 135/50 99.6 F (37.6 C) Oral 100 18 97 %  09/16/22 0440 (!) 154/63 98 F (36.7 C) -- 98 17 97 %  09/15/22 2001 (!) 147/65 98.8 F (37.1 C) Oral 93 18 99 %     Recent laboratory studies: No results for input(s): "WBC", "HGB", "HCT", "PLT", "NA", "K", "CL", "CO2", "BUN", "CREATININE", "GLUCOSE", "INR", "CALCIUM" in the last 72 hours.  Invalid input(s): "PT", "2"   Discharge Medications:   Allergies as of 09/16/2022       Reactions   Codeine Nausea And Vomiting   Gluten Meal Other (See Comments)   No wheat or soy   Metronidazole Nausea And Vomiting   Nsaids Nausea And Vomiting, Other (See Comments)   Cannot tolerate large doses d/t Celiac disease   Soy Allergy    Wheat  Medication List     STOP taking these medications    aspirin EC 81 MG tablet   ibuprofen 200 MG tablet Commonly known as: ADVIL   tiZANidine 2 MG tablet Commonly known as: ZANAFLEX       TAKE these medications    albuterol 108 (90 Base) MCG/ACT inhaler Commonly known as: VENTOLIN HFA TAKE 2 PUFFS BY MOUTH EVERY 6 HOURS AS NEEDED FOR WHEEZE OR SHORTNESS OF BREATH   apixaban 2.5 MG Tabs tablet Commonly known as: ELIQUIS Take 1 tablet (2.5 mg total) by mouth every 12 (twelve) hours.   atorvastatin 40 MG tablet Commonly known as: LIPITOR TAKE 1 TABLET BY MOUTH EVERY DAY   dexlansoprazole 60 MG capsule Commonly known as: DEXILANT TAKE 1 CAPSULE BY MOUTH EVERY DAY   DULoxetine 20 MG capsule Commonly known as: CYMBALTA TAKE 1  CAPSULE BY MOUTH EVERY DAY   furosemide 20 MG tablet Commonly known as: LASIX TAKE 1 TABLET (20 MG TOTAL) BY MOUTH DAILY AS NEEDED FOR FLUID OR EDEMA. What changed: See the new instructions.   gabapentin 600 MG tablet Commonly known as: NEURONTIN TAKE 1 TABLET IN THE MORNING AND 2 TABLETS AT BEDTIME   insulin lispro 100 UNIT/ML KwikPen Commonly known as: HumaLOG KwikPen Inject 5-9 Units into the skin 2 (two) times daily before a meal. What changed: how much to take   Insulin Pen Needle 32G X 4 MM Misc Use 3-4x a day   Insulin Syringe-Needle U-100 31G X 5/16" 1 ML Misc Commonly known as: BD Insulin Syringe U/F USE UP TO 3 TIMES A DAY   ketoconazole 2 % cream Commonly known as: NIZORAL APPLY TWICE A DAY FOR 2 WEEKS AS NEEDED FOR RASH   Lantus SoloStar 100 UNIT/ML Solostar Pen Generic drug: insulin glargine Inject 30 Units into the skin at bedtime.   methocarbamol 500 MG tablet Commonly known as: ROBAXIN Take 1 tablet (500 mg total) by mouth every 6 (six) hours as needed for muscle spasms.   olmesartan 40 MG tablet Commonly known as: BENICAR TAKE 1 TABLET BY MOUTH EVERY DAY   ondansetron 4 MG disintegrating tablet Commonly known as: Zofran ODT Take 1 tablet (4 mg total) by mouth every 8 (eight) hours as needed for nausea or vomiting.   OneTouch Ultra test strip Generic drug: glucose blood USE 2 TIMES DAILY. AND LANCETS 2/DAY   oxyCODONE 5 MG immediate release tablet Commonly known as: Oxy IR/ROXICODONE Take 1-2 tablets (5-10 mg total) by mouth every 4 (four) hours as needed for moderate pain (pain score 4-6).   polyethylene glycol powder 17 GM/SCOOP powder Commonly known as: [employer] Purelax DISSOLVE 1 CAPFUL IN AT LEAST 8 OUNCES WATER/JUICE AND DRINK TWICE DAILY What changed:  how much to take how to take this when to take this additional instructions   sucralfate 1 GM/10ML suspension Commonly known as: CARAFATE Take 10 mLs (1 g total) by mouth 4 (four) times  daily. What changed:  when to take this reasons to take this   Trulicity 0.75 MG/0.5ML Sopn Generic drug: Dulaglutide Inject into the skin weekly   valACYclovir 500 MG tablet Commonly known as: VALTREX TAKE 1 TABLET BY MOUTH EVERY DAY   Vitamin D (Ergocalciferol) 1.25 MG (50000 UNIT) Caps capsule Commonly known as: DRISDOL TAKE 1 CAPSULE (50,000 UNITS TOTAL) BY MOUTH EVERY 30 DAYS               Durable Medical Equipment  (From admission, onward)  Start     Ordered   09/12/22 1202  DME 3 n 1  Once        09/12/22 1201   09/12/22 1202  DME Walker rolling  Once       Question Answer Comment  Walker: With 5 Inch Wheels   Patient needs a walker to treat with the following condition Status post total right knee replacement      09/12/22 1201            Diagnostic Studies: DG Knee Right Port  Result Date: 09/12/2022 CLINICAL DATA:  [id number] Status post total right knee replacement [id number] EXAM: PORTABLE RIGHT KNEE - 1-2 VIEW COMPARISON:  Knee radiograph 05/29/2022 FINDINGS: Postsurgical changes of right total knee arthroplasty. Normal alignment. No evidence of loosening or periprosthetic fracture. Expected soft tissue changes. IMPRESSION: Postsurgical changes of right total knee arthroplasty. No evidence of immediate hardware complication. Electronically Signed   By: Caprice Renshaw M.D.   On: 09/12/2022 09:58    Disposition: Discharge disposition: 03-Skilled Nursing Facility       Discharge Instructions     Diet - low sodium heart healthy   Complete by: As directed    Increase activity slowly   Complete by: As directed         Follow-up Information     Kathryne Hitch, MD Follow up in 2 week(s).   Specialty: Orthopedic Surgery Contact information: [address] [place] [address] [phone]                  Signed: Kathryne Hitch 09/16/2022, 10:21 AM

## 2022-09-16 NOTE — Progress Notes (Signed)
Patient ID: Jessica [name], female   DOB: [birth year], 72 y.o.   MRN: [id number] The patient is doing well.  She has awaiting insurance authorization for short-term CIR.  She has been approved from the facility standpoint in terms of they have a bed available.  If this can happen over the weekend, we will have the appropriate discharge mechanisms in place.  Her vital signs are stable and her right operative knee is stable.

## 2022-09-16 NOTE — Progress Notes (Signed)
  Patient stated, "I just feel tired and don't feel well today".  Patient has sleep a lot, but wakes easily and appropriately.  PT has attempted to work with the patient multiple times today, but she hasn't felt well enough to do very much. Reassessed the patient. Surgical area is clean dry and intact. Overall assessment unchanged.

## 2022-09-16 NOTE — Progress Notes (Signed)
Patient had a mild drop in BP while working with PT.  She felt dizzy.  Notified Dr. Lajoyce Corners. Instructed that pt may still proceed with discharge to rehab when she obtains a room.

## 2022-09-16 NOTE — Progress Notes (Addendum)
Physical Therapy Treatment Patient Details Name: Jessica [name] MRN: [id number] DOB: [birth year] Today's Date: 09/16/2022   History of Present Illness 72 y.o. female presents to [facility] hospital on 09/12/2022 for elective R TKA. PMH includes anxiety, asthma, DM, depression, GERD, HLD, HTN, IBS.    PT Comments    Pt supine in bed reports she needs to urinate.  Pt required assistance to move to edge of bed where she reports dizziness.  Pt continues to present with + orthostasis.  MD and RN informed and MD reports to push PO fluids.  Therapy remains limited due to orthostasis.     Recommendations for follow up therapy are one component of a multi-disciplinary discharge planning process, led by the attending physician.  Recommendations may be updated based on patient status, additional functional criteria and insurance authorization.  Follow Up Recommendations       Assistance Recommended at Discharge Intermittent Supervision/Assistance  Patient can return home with the following A little help with walking and/or transfers;Assistance with cooking/housework;A lot of help with bathing/dressing/bathroom;Assist for transportation;Help with stairs or ramp for entrance   Equipment Recommendations  Rolling walker (2 wheels);BSC/3in1    Recommendations for Other Services       Precautions / Restrictions Precautions Precautions: Fall;Knee Precaution Booklet Issued: Yes (comment) Required Braces or Orthoses: Knee Immobilizer - Right Restrictions Weight Bearing Restrictions: Yes RLE Weight Bearing: Weight bearing as tolerated     Mobility  Bed Mobility Overal bed mobility: Needs Assistance Bed Mobility: Supine to Sit, Sit to Supine     Supine to sit: Min assist, Mod assist, +2 for safety/equipment     General bed mobility comments: Min A for RLE elevation to EOB. Pt continues to benefit from increased time due to pain and discomfort. +2 assistance for back to bed against     Transfers Overall transfer level: Needs assistance Equipment used: Rolling walker (2 wheels) Transfers: Sit to/from Stand Sit to Stand: Min assist, +2 safety/equipment Stand pivot transfers: Min assist         General transfer comment: Cues for hand placement to and from seated surface. Pt reports dizziness sitting edge of bed but reports she needed to urinate so moved to bedside commode where she was unable to urinate despite feeling the urge to.    Ambulation/Gait Ambulation/Gait assistance:  (remains unable due to dizziness and + orthostasis.)                 Stairs             Wheelchair Mobility    Modified Rankin (Stroke Patients Only)       Balance Overall balance assessment: Needs assistance Sitting-balance support: No upper extremity supported, Feet supported Sitting balance-Leahy Scale: Good Sitting balance - Comments: sitting EOB and in recliner   Standing balance support: Single extremity supported, Reliant on assistive device for balance, During functional activity Standing balance-Leahy Scale: Poor Standing balance comment: with RW support                            Cognition Arousal/Alertness: Awake/alert Behavior During Therapy: WFL for tasks assessed/performed Overall Cognitive Status: Within Functional Limits for tasks assessed                                          Exercises     General Comments  Pertinent Vitals/Pain Pain Assessment Pain Assessment: 0-10 Pain Score: 5  Pain Location: R knee with mobility Pain Descriptors / Indicators: Aching, Grimacing, Operative site guarding Pain Intervention(s): Monitored during session, Repositioned    Home Living                          Prior Function            PT Goals (current goals can now be found in the care plan section) Acute Rehab PT Goals Patient Stated Goal: to go to short term rehab, return to independence Potential  to Achieve Goals: Good Progress towards PT goals: Progressing toward goals    Frequency    7X/week      PT Plan Current plan remains appropriate    Co-evaluation              AM-PAC PT "6 Clicks" Mobility   Outcome Measure  Help needed turning from your back to your side while in a flat bed without using bedrails?: None Help needed moving from lying on your back to sitting on the side of a flat bed without using bedrails?: A Little Help needed moving to and from a bed to a chair (including a wheelchair)?: A Little Help needed standing up from a chair using your arms (e.g., wheelchair or bedside chair)?: A Little Help needed to walk in hospital room?: A Little Help needed climbing 3-5 steps with a railing? : Total 6 Click Score: 17    End of Session Equipment Utilized During Treatment: Gait belt Activity Tolerance: Patient limited by pain Patient left: in bed;with call bell/phone within reach;with nursing/sitter in room Nurse Communication: Mobility status;Other (comment) (+ orthstasis) PT Visit Diagnosis: Other abnormalities of gait and mobility (R26.89);Muscle weakness (generalized) (M62.81);Pain Pain - Right/Left: Right Pain - part of body: Knee     Time: 8295-6213 PT Time Calculation (min) (ACUTE ONLY): 30 min  Charges:  $Therapeutic Activity: 23-37 mins                     Bonney Leitz , PTA Acute Rehabilitation Services Office [phone]    Florestine Avers 09/16/2022, 4:50 PM

## 2022-09-17 LAB — GLUCOSE, CAPILLARY
Glucose-Capillary: 193 mg/dL — ABNORMAL HIGH (ref 70–99)
Glucose-Capillary: 260 mg/dL — ABNORMAL HIGH (ref 70–99)
Glucose-Capillary: 147 mg/dL — ABNORMAL HIGH (ref 70–99)
Glucose-Capillary: 216 mg/dL — ABNORMAL HIGH (ref 70–99)

## 2022-09-17 NOTE — Plan of Care (Signed)

## 2022-09-17 NOTE — Progress Notes (Signed)
Physical Therapy Treatment Patient Details Name: Jessica [name] MRN: [id number] DOB: 1951-03-28 Today's Date: 09/17/2022   History of Present Illness 72 y.o. female presents to [facility] hospital on 09/12/2022 for elective R TKA. PMH includes anxiety, asthma, DM, depression, GERD, HLD, HTN, IBS.    PT Comments    This session focused on TKR exercises.  Patient presents with very limited ROM and weakness RLE.     Recommendations for follow up therapy are one component of a multi-disciplinary discharge planning process, led by the attending physician.  Recommendations may be updated based on patient status, additional functional criteria and insurance authorization.  Follow Up Recommendations  Can patient physically be transported by private vehicle: No    Assistance Recommended at Discharge Intermittent Supervision/Assistance  Patient can return home with the following A little help with walking and/or transfers;Assistance with cooking/housework;A lot of help with bathing/dressing/bathroom;Assist for transportation;Help with stairs or ramp for entrance   Equipment Recommendations  Rolling walker (2 wheels);BSC/3in1    Recommendations for Other Services       Precautions / Restrictions Precautions Precautions: Fall;Knee Required Braces or Orthoses: Knee Immobilizer - Right Restrictions RLE Weight Bearing: Weight bearing as tolerated     Mobility  Bed Mobility Overal bed mobility: Needs Assistance Bed Mobility: Supine to Sit     Supine to sit: Min guard Sit to supine: Min assist   General bed mobility comments: min assist to lift RLE to bed    Transfers Overall transfer level: Needs assistance Equipment used: Rolling walker (2 wheels) Transfers: Sit to/from Stand, Bed to chair/wheelchair/BSC Sit to Stand: Min assist   Step pivot transfers: Min assist            Ambulation/Gait Ambulation/Gait assistance: Min assist Gait Distance (Feet): 10 Feet Assistive device:  Rolling walker (2 wheels) Gait Pattern/deviations: Step-to pattern, Decreased stance time - right Gait velocity: decreased     General Gait Details: limited ambulation this pm due to patient's fatigue; no c/o dizziness this session.   Stairs             Wheelchair Mobility    Modified Rankin (Stroke Patients Only)       Balance Overall balance assessment: Needs assistance Sitting-balance support: No upper extremity supported, Feet supported Sitting balance-Leahy Scale: Good     Standing balance support: Bilateral upper extremity supported, During functional activity, Reliant on assistive device for balance                                Cognition Arousal/Alertness: Awake/alert Behavior During Therapy: WFL for tasks assessed/performed Overall Cognitive Status: Within Functional Limits for tasks assessed                                          Exercises Total Joint Exercises Ankle Circles/Pumps: AROM, Supine, 10 reps, Both Quad Sets: AROM, Supine, 10 reps, Both Gluteal Sets: AROM, Both, 10 reps, Supine Towel Squeeze: AROM, Both, Supine, 10 reps Short Arc Quad: AAROM, Right, 10 reps, Supine Heel Slides: AAROM, Supine, Right, 10 reps Hip ABduction/ADduction: AAROM, Right, 10 reps, Supine Straight Leg Raises: AAROM, Right, 10 reps, Supine Goniometric ROM: very limited ROM    General Comments        Pertinent Vitals/Pain Pain Assessment Pain Assessment: 0-10 Pain Score: 5  Pain Location: Right knee Pain Descriptors /  Indicators: Discomfort, Operative site guarding Pain Intervention(s): Limited activity within patient's tolerance, Monitored during session    Home Living                          Prior Function            PT Goals (current goals can now be found in the care plan section) Progress towards PT goals: Progressing toward goals    Frequency    7X/week      PT Plan Current plan remains  appropriate    Co-evaluation              AM-PAC PT "6 Clicks" Mobility   Outcome Measure  Help needed turning from your back to your side while in a flat bed without using bedrails?: None Help needed moving from lying on your back to sitting on the side of a flat bed without using bedrails?: A Little Help needed moving to and from a bed to a chair (including a wheelchair)?: A Little Help needed standing up from a chair using your arms (e.g., wheelchair or bedside chair)?: A Little Help needed to walk in hospital room?: A Lot Help needed climbing 3-5 steps with a railing? : Total 6 Click Score: 16    End of Session   Activity Tolerance: Patient limited by pain Patient left: in bed;with call bell/phone within reach   PT Visit Diagnosis: Other abnormalities of gait and mobility (R26.89);Muscle weakness (generalized) (M62.81);Pain Pain - Right/Left: Right Pain - part of body: Knee     Time: 1620-1640 PT Time Calculation (min) (ACUTE ONLY): 20 min  Charges:  $Therapeutic Exercise: 8-22 mins                    09/17/2022 Delray Alt, PT Acute Rehabilitation Services Office:  [phone]     Olivia Canter 09/17/2022, 4:48 PM

## 2022-09-17 NOTE — Progress Notes (Signed)
Physical Therapy Treatment Patient Details Name: Jessica [name] Courtney MRN: [id number] DOB: 11/13/1950 Today's Date: 09/17/2022   History of Present Illness 72 y.o. female presents to [facility] hospital on 09/12/2022 for elective R TKA. PMH includes anxiety, asthma, DM, depression, GERD, HLD, HTN, IBS.    PT Comments    Patient able to progress to some ambulation today with less c/o dizziness and more stable BP.  Hopefully this will allow patient to progress steadily.  Continues to need inpatient post-acute therapy < 3hours/day.     Recommendations for follow up therapy are one component of a multi-disciplinary discharge planning process, led by the attending physician.  Recommendations may be updated based on patient status, additional functional criteria and insurance authorization.  Follow Up Recommendations  Can patient physically be transported by private vehicle: No    Assistance Recommended at Discharge Intermittent Supervision/Assistance  Patient can return home with the following A little help with walking and/or transfers;Assistance with cooking/housework;A lot of help with bathing/dressing/bathroom;Assist for transportation;Help with stairs or ramp for entrance   Equipment Recommendations  Rolling walker (2 wheels);BSC/3in1    Recommendations for Other Services       Precautions / Restrictions Precautions Precautions: Fall;Knee Required Braces or Orthoses: Knee Immobilizer - Right Restrictions Weight Bearing Restrictions: Yes RLE Weight Bearing: Weight bearing as tolerated     Mobility  Bed Mobility Overal bed mobility: Independent Bed Mobility: Supine to Sit     Supine to sit: Min guard     General bed mobility comments: min assist for RLE to EOB    Transfers Overall transfer level: Needs assistance Equipment used: Rolling walker (2 wheels) Transfers: Sit to/from Stand Sit to Stand: Min assist                Ambulation/Gait Ambulation/Gait assistance: Armed forces technical officer (Feet): 10 Feet Assistive device: Rolling walker (2 wheels) Gait Pattern/deviations: Step-to pattern, Decreased stance time - right           Stairs             Wheelchair Mobility    Modified Rankin (Stroke Patients Only)       Balance Overall balance assessment: Needs assistance Sitting-balance support: No upper extremity supported, Feet supported Sitting balance-Leahy Scale: Good     Standing balance support: Bilateral upper extremity supported, During functional activity, Reliant on assistive device for balance                                Cognition Arousal/Alertness: Awake/alert Behavior During Therapy: WFL for tasks assessed/performed Overall Cognitive Status: Within Functional Limits for tasks assessed                                          Exercises Total Joint Exercises Ankle Circles/Pumps: AROM, Supine, 10 reps, Both Quad Sets: AROM, Supine, 10 reps, Both Gluteal Sets: AROM, Both, 10 reps, Supine    General Comments        Pertinent Vitals/Pain Pain Assessment Pain Score: 4  Pain Location: Right knee Pain Descriptors / Indicators: Discomfort, Operative site guarding Pain Intervention(s): Limited activity within patient's tolerance, Monitored during session    Home Living                          Prior Function  PT Goals (current goals can now be found in the care plan section) Progress towards PT goals: Progressing toward goals    Frequency    7X/week      PT Plan Current plan remains appropriate    Co-evaluation              AM-PAC PT "6 Clicks" Mobility   Outcome Measure  Help needed turning from your back to your side while in a flat bed without using bedrails?: None Help needed moving from lying on your back to sitting on the side of a flat bed without using bedrails?: A Little Help needed moving to and from a bed to a chair (including a  wheelchair)?: A Little Help needed standing up from a chair using your arms (e.g., wheelchair or bedside chair)?: A Little Help needed to walk in hospital room?: A Lot Help needed climbing 3-5 steps with a railing? : Total 6 Click Score: 16    End of Session Equipment Utilized During Treatment: Gait belt Activity Tolerance: Patient tolerated treatment well Patient left: in chair;with call bell/phone within reach   PT Visit Diagnosis: Other abnormalities of gait and mobility (R26.89);Muscle weakness (generalized) (M62.81);Pain Pain - Right/Left: Right Pain - part of body: Knee     Time: 0955-1030 PT Time Calculation (min) (ACUTE ONLY): 35 min  Charges:  $Gait Training: 8-22 mins $Therapeutic Exercise: 8-22 mins                     09/17/2022 Margie, PT Acute Rehabilitation Services Office:  [phone]    Olivia Canter 09/17/2022, 10:38 AM

## 2022-09-17 NOTE — Progress Notes (Signed)
Physical Therapy Treatment Patient Details Name: Jessica [name] MRN: [id number] DOB: [birth year] Today's Date: 09/17/2022   History of Present Illness 72 y.o. female presents to [facility] hospital on 09/12/2022 for elective R TKA. PMH includes anxiety, asthma, DM, depression, GERD, HLD, HTN, IBS.    PT Comments    Patient progressing steadily with mobility.  Session this pm limited by fatigue but patient appeared to be moving better and no c/o dizziness.  Will return later for exercises.   Recommendations for follow up therapy are one component of a multi-disciplinary discharge planning process, led by the attending physician.  Recommendations may be updated based on patient status, additional functional criteria and insurance authorization.  Follow Up Recommendations  Can patient physically be transported by private vehicle: No    Assistance Recommended at Discharge Intermittent Supervision/Assistance  Patient can return home with the following A little help with walking and/or transfers;Assistance with cooking/housework;A lot of help with bathing/dressing/bathroom;Assist for transportation;Help with stairs or ramp for entrance   Equipment Recommendations  Rolling walker (2 wheels);BSC/3in1    Recommendations for Other Services       Precautions / Restrictions Precautions Precautions: Fall;Knee Required Braces or Orthoses: Knee Immobilizer - Right Restrictions Weight Bearing Restrictions: Yes RLE Weight Bearing: Weight bearing as tolerated     Mobility  Bed Mobility Overal bed mobility: Needs Assistance Bed Mobility: Supine to Sit     Supine to sit: Min guard Sit to supine: Min assist   General bed mobility comments: min assist to lift RLE to bed    Transfers Overall transfer level: Needs assistance Equipment used: Rolling walker (2 wheels) Transfers: Sit to/from Stand, Bed to chair/wheelchair/BSC Sit to Stand: Min assist   Step pivot transfers: Min assist             Ambulation/Gait Ambulation/Gait assistance: Min assist Gait Distance (Feet): 10 Feet Assistive device: Rolling walker (2 wheels) Gait Pattern/deviations: Step-to pattern, Decreased stance time - right Gait velocity: decreased     General Gait Details: limited ambulation this pm due to patient's fatigue; no c/o dizziness this session.   Stairs             Wheelchair Mobility    Modified Rankin (Stroke Patients Only)       Balance Overall balance assessment: Needs assistance Sitting-balance support: No upper extremity supported, Feet supported Sitting balance-Leahy Scale: Good     Standing balance support: Bilateral upper extremity supported, During functional activity, Reliant on assistive device for balance                                Cognition Arousal/Alertness: Awake/alert Behavior During Therapy: WFL for tasks assessed/performed Overall Cognitive Status: Within Functional Limits for tasks assessed                                          Exercises Total Joint Exercises Ankle Circles/Pumps: AROM, Supine, 10 reps, Both Quad Sets: AROM, Supine, 10 reps, Both Gluteal Sets: AROM, Both, 10 reps, Supine    General Comments        Pertinent Vitals/Pain Pain Assessment Pain Assessment: 0-10 Pain Score: 7  Pain Location: Right knee Pain Descriptors / Indicators: Discomfort, Operative site guarding, Sharp Pain Intervention(s): Limited activity within patient's tolerance, Monitored during session, Patient requesting pain meds-RN notified    Home Living  Prior Function            PT Goals (current goals can now be found in the care plan section) Progress towards PT goals: Progressing toward goals    Frequency    7X/week      PT Plan Current plan remains appropriate    Co-evaluation              AM-PAC PT "6 Clicks" Mobility   Outcome Measure  Help needed turning from  your back to your side while in a flat bed without using bedrails?: None Help needed moving from lying on your back to sitting on the side of a flat bed without using bedrails?: A Little Help needed moving to and from a bed to a chair (including a wheelchair)?: A Little Help needed standing up from a chair using your arms (e.g., wheelchair or bedside chair)?: A Little Help needed to walk in hospital room?: A Lot Help needed climbing 3-5 steps with a railing? : Total 6 Click Score: 16    End of Session Equipment Utilized During Treatment: Gait belt Activity Tolerance: Patient tolerated treatment well Patient left: in bed;with call bell/phone within reach Nurse Communication: Mobility status PT Visit Diagnosis: Other abnormalities of gait and mobility (R26.89);Muscle weakness (generalized) (M62.81);Pain Pain - Right/Left: Right Pain - part of body: Knee     Time: 1202-1221 PT Time Calculation (min) (ACUTE ONLY): 19 min  Charges:  $Gait Training: 8-22 mins $Therapeutic Exercise: 8-22 mins                     09/17/2022 Jessica [name], PT Acute Rehabilitation Services Office:  [phone]    Jessica [name] 09/17/2022, 12:30 PM

## 2022-09-17 NOTE — Progress Notes (Signed)
Patient ID: Jessica [name], female   DOB: 04-08-1951, 72 y.o.   MRN: [id number] Patient is status post total knee arthroplasty.  Patient states she has had some episodes of low blood pressure.  She states she feels like she is progressing and improving.  She is awaiting authorization for inpatient rehab.

## 2022-09-18 LAB — GLUCOSE, CAPILLARY
Glucose-Capillary: 232 mg/dL — ABNORMAL HIGH (ref 70–99)
Glucose-Capillary: 227 mg/dL — ABNORMAL HIGH (ref 70–99)
Glucose-Capillary: 223 mg/dL — ABNORMAL HIGH (ref 70–99)
Glucose-Capillary: 302 mg/dL — ABNORMAL HIGH (ref 70–99)

## 2022-09-18 MED ORDER — INSULIN ASPART 100 UNIT/ML IJ SOLN
5.0000 [IU] | Freq: Once | INTRAMUSCULAR | Status: AC
  Administered 2022-09-18: 5 [IU] via SUBCUTANEOUS

## 2022-09-18 NOTE — Progress Notes (Signed)
Physical Therapy Treatment Patient Details Name: Jessica [name] MRN: [id number] DOB: 04-06-1951 Today's Date: 09/18/2022   History of Present Illness 72 y.o. female presents to [facility] hospital on 09/12/2022 for elective R TKA. PMH includes anxiety, asthma, DM, depression, GERD, HLD, HTN, IBS.    PT Comments    Pt seen for PT tx with pt agreeable. Pt voicing frustration re: post acute rehab situation with PT providing listening & encouragement. Pt is able to complete STS from recliner with CGA but requires min assist with use of grab bar for STS from low toilet. Pt is progressing with ambulation distance but still maximum distance is only 16 ft with min assist. Pt is limited by significant fatigue, SOB with PT providing education re: pursed lip breathing. Pt engages in seated RLE exercises demonstrating significant weakness in knee extensors. Recommend ongoing PT services to address deficits noted below, as pt is unsafe to d/c home alone as her spouse uses a w/c & cannot provide the physical assistance she requires at this time.  After gait SpO2 100% on room air, max HR 124 bpm, BP in RUE 138/58 mmHg MAP 81    Recommendations for follow up therapy are one component of a multi-disciplinary discharge planning process, led by the attending physician.  Recommendations may be updated based on patient status, additional functional criteria and insurance authorization.  Follow Up Recommendations  Can patient physically be transported by private vehicle: Yes    Assistance Recommended at Discharge Intermittent Supervision/Assistance  Patient can return home with the following A little help with walking and/or transfers;Assistance with cooking/housework;A lot of help with bathing/dressing/bathroom;Assist for transportation;Help with stairs or ramp for entrance   Equipment Recommendations  Rolling walker (2 wheels);BSC/3in1    Recommendations for Other Services       Precautions / Restrictions  Precautions Precautions: Fall;Knee Required Braces or Orthoses: Knee Immobilizer - Right Restrictions Weight Bearing Restrictions: Yes RLE Weight Bearing: Weight bearing as tolerated     Mobility  Bed Mobility               General bed mobility comments: not tested, pt received & left sitting in recliner    Transfers Overall transfer level: Needs assistance Equipment used: Rolling walker (2 wheels) Transfers: Sit to/from Stand Sit to Stand: Min guard, Min assist           General transfer comment: CGA STS from recliner, min assist STS from low toilet with use of grab bar    Ambulation/Gait Ambulation/Gait assistance: Min assist Gait Distance (Feet): 10 Feet (+ 16 ft) Assistive device: Rolling walker (2 wheels) Gait Pattern/deviations: Decreased step length - left, Decreased step length - right, Decreased stride length, Decreased dorsiflexion - left, Decreased dorsiflexion - right, Decreased stance time - right, Decreased weight shift to right Gait velocity: decreased     General Gait Details: standing rest breaks PRN   Stairs             Wheelchair Mobility    Modified Rankin (Stroke Patients Only)       Balance Overall balance assessment: Needs assistance Sitting-balance support: No upper extremity supported, Feet supported Sitting balance-Leahy Scale: Good     Standing balance support: Bilateral upper extremity supported, During functional activity, Reliant on assistive device for balance Standing balance-Leahy Scale: Poor Standing balance comment: Stands to perform hand hygiene with min assist at sink  Cognition Arousal/Alertness: Awake/alert Behavior During Therapy: WFL for tasks assessed/performed Overall Cognitive Status: Within Functional Limits for tasks assessed                                 General Comments: Pt frustrated/emotional re: post acute rehab situation with PT providing  encouragement & listening throughout.        Exercises General Exercises - Lower Extremity Short Arc Quad: AROM, Strengthening, Right, 10 reps Heel Slides: AAROM, Strengthening, Right, 10 reps Hip ABduction/ADduction: AAROM, Strengthening, Right, 10 reps (hip abduction slides)    General Comments General comments (skin integrity, edema, etc.): Continent void on toilet with pt performing peri hygiene.      Pertinent Vitals/Pain Pain Assessment Pain Assessment: 0-10 Pain Score: 7  (6-7/10 at beginning of session, increased to 7-8/10 by end of session) Pain Location: Right knee Pain Descriptors / Indicators: Discomfort, Operative site guarding, Guarding, Grimacing Pain Intervention(s): Monitored during session, Limited activity within patient's tolerance, Repositioned    Home Living                          Prior Function            PT Goals (current goals can now be found in the care plan section) Acute Rehab PT Goals Patient Stated Goal: to go to rehab, to be able to care for herself PT Goal Formulation: With patient Time For Goal Achievement: 09/19/22 Potential to Achieve Goals: Good Progress towards PT goals: Progressing toward goals    Frequency    7X/week      PT Plan Discharge plan needs to be updated    Co-evaluation              AM-PAC PT "6 Clicks" Mobility   Outcome Measure  Help needed turning from your back to your side while in a flat bed without using bedrails?: None Help needed moving from lying on your back to sitting on the side of a flat bed without using bedrails?: A Little Help needed moving to and from a bed to a chair (including a wheelchair)?: A Little Help needed standing up from a chair using your arms (e.g., wheelchair or bedside chair)?: A Little Help needed to walk in hospital room?: A Little Help needed climbing 3-5 steps with a railing? : Total 6 Click Score: 17    End of Session Equipment Utilized During  Treatment: Gait belt Activity Tolerance: Patient limited by fatigue;Patient limited by pain Patient left: in chair;with chair alarm set;with call bell/phone within reach Nurse Communication: Mobility status PT Visit Diagnosis: Other abnormalities of gait and mobility (R26.89);Muscle weakness (generalized) (M62.81);Pain;Difficulty in walking, not elsewhere classified (R26.2) Pain - Right/Left: Right Pain - part of body: Knee     Time: 1610-9604 PT Time Calculation (min) (ACUTE ONLY): 27 min  Charges:  $Therapeutic Activity: 23-37 mins                     Aleda Grana, PT, DPT 09/18/22, 2:12 PM   Sandi Mariscal 09/18/2022, 2:10 PM

## 2022-09-18 NOTE — TOC Progression Note (Addendum)
Transition of Care [facility]) - Progression Note    Patient Details  Name: Jessica [name] MRN: [id number] Date of Birth: 1950/07/12  Transition of [facility]) CM/SW Contact  Lorri Frederick, LCSW Phone Number: 09/18/2022, 9:39 AM  Clinical Narrative:   Peer to peer information passed on to MD.  1310: CSW spoke with pt in room, she is aware of P2P and possibility that [facility] will not be approved.  If that occurs, she says she knows she would need SNF.  CSW provided current SNF offers on medicare choice document for her to review.  [facility] CIR auth still pending after P2P.  1350: auth denied after P2P.  CSW informed pt,  She will review SNF offers.  1515: CSW spoke with pt in room, she is requesting responses from [facility] and [facility].  She is talking to a friend about current offers and will make decision by morning.  CSW submitted insurance auth with facility choice pending.    Expected Discharge Plan: Skilled Nursing Facility Barriers to Discharge: Continued Medical Work up, SNF Pending bed offer  Expected Discharge Plan and Services In-house Referral: Clinical Social Work   Post Acute Care Choice: Skilled Nursing Facility Living arrangements for the past 2 months: Single Family Home Expected Discharge Date: 09/18/22                                     Social Determinants of Health (SDOH) Interventions SDOH Screenings   Food Insecurity: No Food Insecurity (03/13/2022)  Housing: Low Risk  (03/13/2022)  Transportation Needs: No Transportation Needs (03/13/2022)  Depression (PHQ2-9): Low Risk  (04/12/2022)  Financial Resource Strain: Low Risk  (03/13/2022)  Physical Activity: Inactive (03/13/2022)  Social Connections: Moderately Integrated (03/13/2022)  Stress: Stress Concern Present (03/13/2022)  Tobacco Use: Low Risk  (09/13/2022)    Readmission Risk Interventions     No data to display

## 2022-09-18 NOTE — Progress Notes (Signed)
Occupational Therapy Treatment Patient Details Name: Jessica [name] MRN: [id number] DOB: 05/28/1950 Today's Date: 09/18/2022   History of present illness 72 y.o. female presents to [facility] hospital on 09/12/2022 for elective R TKA. PMH includes anxiety, asthma, DM, depression, GERD, HLD, HTN, IBS.   OT comments  Pt continuing to progress in OT sessions, Pt demonstrated better pain tolerance and ambulated ~30 ft with RW but relies heavily on RW for support which results in arm fatigue when ambulating. Pt with improved STS balance following practice. Discussed the benefits of AE to assist with LB dressing, OT to f/u with patient as able to discuss the benefits of hip kit for dressing/bathing. OT to continue to progress pt as able, DC plans remain appropriate for intensive rehab with >3hrs of therapy, pt has the potential to reach a mod I level upon DC pending pain tolerance progression.   Recommendations for follow up therapy are one component of a multi-disciplinary discharge planning process, led by the attending physician.  Recommendations may be updated based on patient status, additional functional criteria and insurance authorization.    Assistance Recommended at Discharge Frequent or constant Supervision/Assistance  Patient can return home with the following  A lot of help with walking and/or transfers;A lot of help with bathing/dressing/bathroom;Assist for transportation;Help with stairs or ramp for entrance   Equipment Recommendations  BSC/3in1    Recommendations for Other Services      Precautions / Restrictions Precautions Precautions: Fall;Knee Required Braces or Orthoses: Knee Immobilizer - Right Restrictions Weight Bearing Restrictions: Yes RLE Weight Bearing: Weight bearing as tolerated       Mobility Bed Mobility               General bed mobility comments: pt rec'd and left sitting in recliner    Transfers Overall transfer level: Needs assistance Equipment used:  Rolling walker (2 wheels) Transfers: Sit to/from Stand Sit to Stand: Min assist           General transfer comment: Pt progressed to CGA with second attempt for STS, needing time to gather balance before transitioning to RW     Balance       Sitting balance - Comments: pt in recliner, unable to fully assess   Standing balance support: Bilateral upper extremity supported, During functional activity, Reliant on assistive device for balance Standing balance-Leahy Scale: Poor                             ADL either performed or assessed with clinical judgement   ADL                                       Functional mobility during ADLs: Min guard;Rolling walker (2 wheels) (ambulated ~9ft total)      Extremity/Trunk Assessment              Vision       Perception     Praxis      Cognition Arousal/Alertness: Awake/alert Behavior During Therapy: WFL for tasks assessed/performed Overall Cognitive Status: Within Functional Limits for tasks assessed                                          Exercises      Shoulder Instructions  General Comments      Pertinent Vitals/ Pain       Pain Assessment Pain Assessment: 0-10 Pain Score: 6  Pain Location: Right knee Pain Descriptors / Indicators: Discomfort, Operative site guarding Pain Intervention(s): Monitored during session, Limited activity within patient's tolerance  Home Living                                          Prior Functioning/Environment              Frequency  Min 2X/week        Progress Toward Goals  OT Goals(current goals can now be found in the care plan section)  Progress towards OT goals: Progressing toward goals  Acute Rehab OT Goals Patient Stated Goal: to get rehab before going home OT Goal Formulation: With patient Time For Goal Achievement: 09/28/22 Potential to Achieve Goals: Good  Plan Discharge plan  remains appropriate;Frequency remains appropriate    Co-evaluation                 AM-PAC OT "6 Clicks" Daily Activity     Outcome Measure   Help from another person eating meals?: None Help from another person taking care of personal grooming?: A Little Help from another person toileting, which includes using toliet, bedpan, or urinal?: A Lot Help from another person bathing (including washing, rinsing, drying)?: A Lot Help from another person to put on and taking off regular upper body clothing?: A Little Help from another person to put on and taking off regular lower body clothing?: A Lot 6 Click Score: 16    End of Session Equipment Utilized During Treatment: Gait belt;Rolling walker (2 wheels)  OT Visit Diagnosis: Unsteadiness on feet (R26.81);Other abnormalities of gait and mobility (R26.89);Muscle weakness (generalized) (M62.81);Pain Pain - Right/Left: Right Pain - part of body: Knee   Activity Tolerance Patient tolerated treatment well   Patient Left in chair;with call bell/phone within reach   Nurse Communication Mobility status        Time: 1610-9604 OT Time Calculation (min): 23 min  Charges: OT General Charges $OT Visit: 1 Visit OT Treatments $Therapeutic Activity: 23-37 mins  09/18/2022  AB, OTR/L  Acute Rehabilitation Services  Office: [phone]   Tristan Schroeder 09/18/2022, 1:44 PM

## 2022-09-18 NOTE — Progress Notes (Signed)
Patient ID: Jessica [name], female   DOB: 1951-03-01, 73 y.o.   MRN: [id number] I have put in a call to the patient's insurance for a peer to peer review.  I am waiting for a call back.  From a therapy standpoint, and needs to be shown that she would still benefit from short-term skilled nursing placement as opposed to being home due to her decreased mobility and safety issues.  She needs the assistance from short-term skilled nursing and her husband is unable to help since he is basically almost wheelchair-bound.

## 2022-09-18 NOTE — Care Management Important Message (Signed)
Important Message  Patient Details  Name: Jessica [name] MRN: [id number] Date of Birth: Mar 29, 1951   Medicare Important Message Given:  Yes     Sherilyn Banker 09/18/2022, 2:16 PM

## 2022-09-18 NOTE — Progress Notes (Signed)
Patient ID: Jessica [name], female   DOB: Oct 19, 1950, 72 y.o.   MRN: [id number] The patient's vital signs are stable.  Her blood pressure is actually just slightly high.  This is postoperative day 6 now.  The only thing that is keeping her in the hospital is waiting on insurance authorization for short-term rehab.  Hopefully she can be discharged today.  She is otherwise stable and having right operative knee is stable.  She is no longer needs an inpatient hospital stay.

## 2022-09-18 NOTE — Plan of Care (Signed)
  Problem: Education: Goal: Knowledge of General Education information will improve Description: Including pain rating scale, medication(s)/side effects and non-pharmacologic comfort measures Outcome: Adequate for Discharge   Problem: Clinical Measurements: Goal: Will remain free from infection Outcome: Adequate for Discharge   Problem: Activity: Goal: Risk for activity intolerance will decrease Outcome: Adequate for Discharge   Problem: Nutrition: Goal: Adequate nutrition will be maintained Outcome: Adequate for Discharge   Problem: Elimination: Goal: Will not experience complications related to bowel motility Outcome: Adequate for Discharge   Problem: Pain Managment: Goal: General experience of comfort will improve Outcome: Adequate for Discharge   

## 2022-09-18 NOTE — Discharge Summary (Signed)
Patient ID: Jessica [name] MRN: [id number] DOB/AGE: 11/08/1950 72 y.o.  Admit date: 09/12/2022 Discharge date: 09/18/2022  Admission Diagnoses:  Principal Problem:   Status post total right knee replacement Active Problems:   Unilateral primary osteoarthritis, right knee   S/P TKR (total knee replacement)   Discharge Diagnoses:  Same  Past Medical History:  Diagnosis Date   Anxiety    Arthritis    bilateral knees   Asthma    childhood, inhaler for wheezing PRN   Autonomic neuropathy    diabetic   Cataract    Celiac disease    Constipation    Depression    Diabetes mellitus    type II, Hemoglobin A1C 9.9 10/05/2011   Diabetic neuropathy (HCC)    Diverticulosis    Fatty liver    Gastroparesis    GERD (gastroesophageal reflux disease)    History of claustrophobia    with MRI tests   Hyperlipidemia    Hypertension    IBS (irritable bowel syndrome)    Kidney stones    Personal history of colonic polyps 03/2010   hyperplastic   PONV (postoperative nausea and vomiting)    Shingles 2021    Surgeries: Procedure(s): RIGHT TOTAL KNEE ARTHROPLASTY on 09/12/2022   Consultants:   Discharged Condition: Improved  Hospital Course: Jessica [name] is an 72 y.o. female who was admitted 09/12/2022 for operative treatment ofStatus post total right knee replacement. Patient has severe unremitting pain that affects sleep, daily activities, and work/hobbies. After pre-op clearance the patient was taken to the operating room on 09/12/2022 and underwent  Procedure(s): RIGHT TOTAL KNEE ARTHROPLASTY.    Patient was given perioperative antibiotics:  Anti-infectives (From admission, onward)    Start     Dose/Rate Route Frequency Ordered Stop   09/13/22 1000  valACYclovir (VALTREX) tablet 500 mg        500 mg Oral Daily 09/12/22 1200     09/12/22 1330  ceFAZolin (ANCEF) IVPB 1 g/50 mL premix        1 g 100 mL/hr over 30 Minutes Intravenous Every 6 hours 09/12/22 1148 09/12/22 1952    09/12/22 0600  ceFAZolin (ANCEF) IVPB 2g/100 mL premix        2 g 200 mL/hr over 30 Minutes Intravenous On call to O.R. 09/12/22 1191 09/12/22 0806        Patient was given sequential compression devices, early ambulation, and chemoprophylaxis to prevent DVT.  Patient benefited maximally from hospital stay and there were no complications.    Recent vital signs: Patient Vitals for the past 24 hrs:  BP Temp Temp src Pulse Resp SpO2  09/18/22 0700 (!) 146/58 98 F (36.7 C) Oral 78 18 98 %  09/18/22 0453 (!) 142/65 -- -- 81 17 98 %  09/17/22 2045 (!) 145/56 98.7 F (37.1 C) Oral 84 17 98 %  09/17/22 1620 (!) 146/59 98.4 F (36.9 C) Oral 86 18 100 %  09/17/22 0813 (!) 162/69 98.6 F (37 C) Oral 94 18 99 %     Recent laboratory studies: No results for input(s): "WBC", "HGB", "HCT", "PLT", "NA", "K", "CL", "CO2", "BUN", "CREATININE", "GLUCOSE", "INR", "CALCIUM" in the last 72 hours.  Invalid input(s): "PT", "2"   Discharge Medications:   Allergies as of 09/18/2022       Reactions   Codeine Nausea And Vomiting   Gluten Meal Other (See Comments)   No wheat or soy   Metronidazole Nausea And Vomiting   Nsaids Nausea And Vomiting,  Other (See Comments)   Cannot tolerate large doses d/t Celiac disease   Soy Allergy    Wheat         Medication List     STOP taking these medications    aspirin EC 81 MG tablet   ibuprofen 200 MG tablet Commonly known as: ADVIL   tiZANidine 2 MG tablet Commonly known as: ZANAFLEX       TAKE these medications    albuterol 108 (90 Base) MCG/ACT inhaler Commonly known as: VENTOLIN HFA TAKE 2 PUFFS BY MOUTH EVERY 6 HOURS AS NEEDED FOR WHEEZE OR SHORTNESS OF BREATH   apixaban 2.5 MG Tabs tablet Commonly known as: ELIQUIS Take 1 tablet (2.5 mg total) by mouth every 12 (twelve) hours.   atorvastatin 40 MG tablet Commonly known as: LIPITOR TAKE 1 TABLET BY MOUTH EVERY DAY   dexlansoprazole 60 MG capsule Commonly known as:  DEXILANT TAKE 1 CAPSULE BY MOUTH EVERY DAY   DULoxetine 20 MG capsule Commonly known as: CYMBALTA TAKE 1 CAPSULE BY MOUTH EVERY DAY   furosemide 20 MG tablet Commonly known as: LASIX TAKE 1 TABLET (20 MG TOTAL) BY MOUTH DAILY AS NEEDED FOR FLUID OR EDEMA. What changed: See the new instructions.   gabapentin 600 MG tablet Commonly known as: NEURONTIN TAKE 1 TABLET IN THE MORNING AND 2 TABLETS AT BEDTIME   insulin lispro 100 UNIT/ML KwikPen Commonly known as: HumaLOG KwikPen Inject 5-9 Units into the skin 2 (two) times daily before a meal. What changed: how much to take   Insulin Pen Needle 32G X 4 MM Misc Use 3-4x a day   Insulin Syringe-Needle U-100 31G X 5/16" 1 ML Misc Commonly known as: BD Insulin Syringe U/F USE UP TO 3 TIMES A DAY   ketoconazole 2 % cream Commonly known as: NIZORAL APPLY TWICE A DAY FOR 2 WEEKS AS NEEDED FOR RASH   Lantus SoloStar 100 UNIT/ML Solostar Pen Generic drug: insulin glargine Inject 30 Units into the skin at bedtime.   methocarbamol 500 MG tablet Commonly known as: ROBAXIN Take 1 tablet (500 mg total) by mouth every 6 (six) hours as needed for muscle spasms.   olmesartan 40 MG tablet Commonly known as: BENICAR TAKE 1 TABLET BY MOUTH EVERY DAY   ondansetron 4 MG disintegrating tablet Commonly known as: Zofran ODT Take 1 tablet (4 mg total) by mouth every 8 (eight) hours as needed for nausea or vomiting.   OneTouch Ultra test strip Generic drug: glucose blood USE 2 TIMES DAILY. AND LANCETS 2/DAY   oxyCODONE 5 MG immediate release tablet Commonly known as: Oxy IR/ROXICODONE Take 1-2 tablets (5-10 mg total) by mouth every 4 (four) hours as needed for moderate pain (pain score 4-6).   polyethylene glycol powder 17 GM/SCOOP powder Commonly known as: [employer] Purelax DISSOLVE 1 CAPFUL IN AT LEAST 8 OUNCES WATER/JUICE AND DRINK TWICE DAILY What changed:  how much to take how to take this when to take this additional instructions    sucralfate 1 GM/10ML suspension Commonly known as: CARAFATE Take 10 mLs (1 g total) by mouth 4 (four) times daily. What changed:  when to take this reasons to take this   Trulicity 0.75 MG/0.5ML Sopn Generic drug: Dulaglutide Inject into the skin weekly   valACYclovir 500 MG tablet Commonly known as: VALTREX TAKE 1 TABLET BY MOUTH EVERY DAY   Vitamin D (Ergocalciferol) 1.25 MG (50000 UNIT) Caps capsule Commonly known as: DRISDOL TAKE 1 CAPSULE (50,000 UNITS TOTAL) BY MOUTH EVERY 30  DAYS               Durable Medical Equipment  (From admission, onward)           Start     Ordered   09/12/22 1202  DME 3 n 1  Once        09/12/22 1201   09/12/22 1202  DME Walker rolling  Once       Question Answer Comment  Walker: With 5 Inch Wheels   Patient needs a walker to treat with the following condition Status post total right knee replacement      09/12/22 1201            Diagnostic Studies: DG Knee Right Port  Result Date: 09/12/2022 CLINICAL DATA:  [id number] Status post total right knee replacement [id number] EXAM: PORTABLE RIGHT KNEE - 1-2 VIEW COMPARISON:  Knee radiograph 05/29/2022 FINDINGS: Postsurgical changes of right total knee arthroplasty. Normal alignment. No evidence of loosening or periprosthetic fracture. Expected soft tissue changes. IMPRESSION: Postsurgical changes of right total knee arthroplasty. No evidence of immediate hardware complication. Electronically Signed   By: Caprice Renshaw M.D.   On: 09/12/2022 09:58    Disposition: Discharge disposition: 03-Skilled Nursing Facility       Discharge Instructions     Diet - low sodium heart healthy   Complete by: As directed    Increase activity slowly   Complete by: As directed         Follow-up Information     Kathryne Hitch, MD Follow up in 2 week(s).   Specialty: Orthopedic Surgery Contact information: [address] [place] [address] [phone]                   Signed: Kathryne Hitch 09/18/2022, 7:59 AM

## 2022-09-19 LAB — GLUCOSE, CAPILLARY
Glucose-Capillary: 259 mg/dL — ABNORMAL HIGH (ref 70–99)
Glucose-Capillary: 230 mg/dL — ABNORMAL HIGH (ref 70–99)
Glucose-Capillary: 232 mg/dL — ABNORMAL HIGH (ref 70–99)
Glucose-Capillary: 159 mg/dL — ABNORMAL HIGH (ref 70–99)
Glucose-Capillary: 98 mg/dL (ref 70–99)

## 2022-09-19 MED ORDER — INSULIN ASPART 100 UNIT/ML IJ SOLN
0.0000 [IU] | Freq: Three times a day (TID) | INTRAMUSCULAR | Status: DC
  Administered 2022-09-19: 7 [IU] via SUBCUTANEOUS
  Administered 2022-09-19: 4 [IU] via SUBCUTANEOUS
  Administered 2022-09-20: 11 [IU] via SUBCUTANEOUS
  Administered 2022-09-20: 7 [IU] via SUBCUTANEOUS

## 2022-09-19 MED ORDER — INSULIN ASPART 100 UNIT/ML IJ SOLN: 0.0000 [IU] | Freq: Every day | INTRAMUSCULAR | Status: DC

## 2022-09-19 NOTE — Plan of Care (Signed)
  Problem: Education: Goal: Knowledge of General Education information will improve Description: Including pain rating scale, medication(s)/side effects and non-pharmacologic comfort measures Outcome: Adequate for Discharge   Problem: Health Behavior/Discharge Planning: Goal: Ability to manage health-related needs will improve Outcome: Adequate for Discharge   Problem: Clinical Measurements: Goal: Ability to maintain clinical measurements within normal limits will improve Outcome: Adequate for Discharge Goal: Will remain free from infection Outcome: Adequate for Discharge Goal: Diagnostic test results will improve Outcome: Adequate for Discharge Goal: Respiratory complications will improve Outcome: Adequate for Discharge Goal: Cardiovascular complication will be avoided Outcome: Adequate for Discharge   Problem: Activity: Goal: Risk for activity intolerance will decrease Outcome: Adequate for Discharge   Problem: Nutrition: Goal: Adequate nutrition will be maintained Outcome: Adequate for Discharge   Problem: Coping: Goal: Level of anxiety will decrease Outcome: Adequate for Discharge   Problem: Elimination: Goal: Will not experience complications related to bowel motility Outcome: Adequate for Discharge Goal: Will not experience complications related to urinary retention Outcome: Adequate for Discharge   Problem: Pain Managment: Goal: General experience of comfort will improve Outcome: Adequate for Discharge   Problem: Safety: Goal: Ability to remain free from injury will improve Outcome: Adequate for Discharge   Problem: Skin Integrity: Goal: Risk for impaired skin integrity will decrease Outcome: Adequate for Discharge   Problem: Education: Goal: Ability to describe self-care measures that may prevent or decrease complications (Diabetes Survival Skills Education) will improve Outcome: Adequate for Discharge Goal: Individualized Educational Video(s) Outcome:  Adequate for Discharge   Problem: Coping: Goal: Ability to adjust to condition or change in health will improve Outcome: Adequate for Discharge   Problem: Fluid Volume: Goal: Ability to maintain a balanced intake and output will improve Outcome: Adequate for Discharge   Problem: Health Behavior/Discharge Planning: Goal: Ability to identify and utilize available resources and services will improve Outcome: Adequate for Discharge Goal: Ability to manage health-related needs will improve Outcome: Adequate for Discharge   Problem: Metabolic: Goal: Ability to maintain appropriate glucose levels will improve Outcome: Adequate for Discharge   Problem: Nutritional: Goal: Maintenance of adequate nutrition will improve Outcome: Adequate for Discharge Goal: Progress toward achieving an optimal weight will improve Outcome: Adequate for Discharge   Problem: Skin Integrity: Goal: Risk for impaired skin integrity will decrease Outcome: Adequate for Discharge   Problem: Tissue Perfusion: Goal: Adequacy of tissue perfusion will improve Outcome: Adequate for Discharge   Problem: Education: Goal: Knowledge of the prescribed therapeutic regimen will improve Outcome: Adequate for Discharge Goal: Individualized Educational Video(s) Outcome: Adequate for Discharge   Problem: Activity: Goal: Ability to avoid complications of mobility impairment will improve Outcome: Adequate for Discharge Goal: Range of joint motion will improve Outcome: Adequate for Discharge   Problem: Clinical Measurements: Goal: Postoperative complications will be avoided or minimized Outcome: Adequate for Discharge   Problem: Pain Management: Goal: Pain level will decrease with appropriate interventions Outcome: Adequate for Discharge   Problem: Skin Integrity: Goal: Will show signs of wound healing Outcome: Adequate for Discharge   

## 2022-09-19 NOTE — Progress Notes (Signed)
Patient ID: Jessica [name], female   DOB: November 06, 1950, 72 y.o.   MRN: [id number] Per the patient's insurance and after a peer to peer review, it was determined the patient did not qualify for CIR.  She is however candidate for short-term skilled nursing placement.  The transitional care team is aware of this and has given the patient options.  Her blood glucose has run high at night and I counseled her about this and added some insulin for her nighttime coverage.  On orthopedic and medical standpoint, her vital signs are stable and her right operative knee is stable.  She can be discharged to skilled nursing once a bed is available.

## 2022-09-19 NOTE — Progress Notes (Signed)
Physical Therapy Treatment Patient Details Name: Jessica [name] MRN: [id number] DOB: May 06, 1950 Today's Date: 09/19/2022   History of Present Illness 72 y.o. female presents to [facility] hospital on 09/12/2022 for elective R TKA. PMH includes anxiety, asthma, DM, depression, GERD, HLD, HTN, IBS.    PT Comments    Pt received in supine and agreeable to session. Pt able to perform supine and seated exercises to promote R knee mobility with pt demonstrating limited R knee flexion. Pt requesting to use the bathroom at the beginning of the session and was able to perform seated pericare and hand hygiene standing at the sink with up to min guard for safety. Pt able to tolerate short gait distance in the room progressing to supervision with no evidence of R knee instability. Gait trial focused on improving R knee flexion during swing phase due to pt leaning back to advance RLE with pt able to minimally improve. Pt continues to benefit from PT services to progress toward functional mobility goals.    Recommendations for follow up therapy are one component of a multi-disciplinary discharge planning process, led by the attending physician.  Recommendations may be updated based on patient status, additional functional criteria and insurance authorization.  Assistance Recommended at Discharge Intermittent Supervision/Assistance  Patient can return home with the following A little help with walking and/or transfers;Assistance with cooking/housework;A lot of help with bathing/dressing/bathroom;Assist for transportation;Help with stairs or ramp for entrance   Equipment Recommendations  Rolling walker (2 wheels);BSC/3in1    Recommendations for Other Services       Precautions / Restrictions Precautions Precautions: Fall;Knee Precaution Booklet Issued: Yes (comment) Required Braces or Orthoses: Knee Immobilizer - Right Knee Immobilizer - Right: Other (comment) (not in orders) Restrictions Weight Bearing  Restrictions: Yes RLE Weight Bearing: Weight bearing as tolerated     Mobility  Bed Mobility Overal bed mobility: Needs Assistance Bed Mobility: Supine to Sit     Supine to sit: Supervision Sit to supine: Supervision   General bed mobility comments: Pt using gait belt to elevate RLE to EOB when returning to supine. Pt able to scoot towards HOB using LLE and BUE with cues for technique.    Transfers Overall transfer level: Needs assistance Equipment used: Rolling walker (2 wheels) Transfers: Sit to/from Stand Sit to Stand: Min guard           General transfer comment: From EOB and low toilet with min guard for safety    Ambulation/Gait Ambulation/Gait assistance: Supervision, Min guard Gait Distance (Feet): 30 Feet (+10) Assistive device: Rolling walker (2 wheels) Gait Pattern/deviations: Decreased step length - left, Decreased stride length, Decreased dorsiflexion - left, Decreased dorsiflexion - right, Decreased weight shift to right, Decreased stance time - right, Step-to pattern, Antalgic Gait velocity: decreased     General Gait Details: Pt demonstrating slow step-to pattern. Cues for increased R knee flexion during swing phase and heel strike with pt able to minimally improve.      Balance Overall balance assessment: Needs assistance Sitting-balance support: No upper extremity supported, Feet supported Sitting balance-Leahy Scale: Good Sitting balance - Comments: sitting EOB   Standing balance support: Bilateral upper extremity supported, During functional activity, Reliant on assistive device for balance Standing balance-Leahy Scale: Poor Standing balance comment: with RW support and pt able to stand at sink to wash hands with min guard  Cognition Arousal/Alertness: Awake/alert Behavior During Therapy: WFL for tasks assessed/performed Overall Cognitive Status: Within Functional Limits for tasks assessed                                           Exercises Total Joint Exercises Heel Slides: AAROM, Supine, Right, 5 reps Knee Flexion: AROM, AAROM, Seated, Right, 10 reps Marching in Standing: AROM, Both, Standing, 5 reps    General Comments        Pertinent Vitals/Pain Pain Assessment Pain Assessment: 0-10 Pain Score: 5  Pain Location: Right knee Pain Descriptors / Indicators: Discomfort, Operative site guarding, Guarding, Grimacing Pain Intervention(s): Monitored during session, Repositioned     PT Goals (current goals can now be found in the care plan section) Acute Rehab PT Goals Patient Stated Goal: to go to rehab, to be able to care for herself PT Goal Formulation: With patient Time For Goal Achievement: 09/19/22 Potential to Achieve Goals: Good Progress towards PT goals: Progressing toward goals    Frequency    7X/week      PT Plan Discharge plan needs to be updated    AM-PAC PT "6 Clicks" Mobility   Outcome Measure  Help needed turning from your back to your side while in a flat bed without using bedrails?: None Help needed moving from lying on your back to sitting on the side of a flat bed without using bedrails?: A Little Help needed moving to and from a bed to a chair (including a wheelchair)?: A Little Help needed standing up from a chair using your arms (e.g., wheelchair or bedside chair)?: A Little Help needed to walk in hospital room?: A Little Help needed climbing 3-5 steps with a railing? : A Lot 6 Click Score: 18    End of Session Equipment Utilized During Treatment: Gait belt Activity Tolerance: Patient limited by fatigue;Patient limited by pain Patient left: in bed;with call bell/phone within reach Nurse Communication: Mobility status PT Visit Diagnosis: Other abnormalities of gait and mobility (R26.89);Muscle weakness (generalized) (M62.81);Pain;Difficulty in walking, not elsewhere classified (R26.2) Pain - Right/Left: Right Pain - part of  body: Knee     Time: 4098-1191 PT Time Calculation (min) (ACUTE ONLY): 34 min  Charges:  $Gait Training: 23-37 mins                     Johny Shock, PTA Acute Rehabilitation Services Secure Chat Preferred  [phone]    Johny Shock 09/19/2022, 4:09 PM

## 2022-09-19 NOTE — TOC Progression Note (Addendum)
Transition of Care [facility]) - Progression Note    Patient Details  Name: Jessica [name] Cayson MRN: [id number] Date of Birth: 19-Aug-1950  Transition of Care [facility]) CM/SW Contact  Lorri Frederick, LCSW Phone Number: 09/19/2022, 9:37 AM  Clinical Narrative:    [facility] cannot offer.  Still waiting on decision from Lilian Coma out to [facility] again.  CSW spoke with pt who is now asking for referral to [facility].  CSW spoke with [facility], who is not sure if bed available.  She will review referral shortly.    0945: [facility] cannot offer bed.  1245: several calls to [facility].  Still no decision.    1430: CSW informed by pt that [facility] is gone for the day, according to her niece, who has also been calling her.  Pt does confirm that [facility] is first choice for SNF, if they can offer bed.  Expected Discharge Plan: Skilled Nursing Facility Barriers to Discharge: Continued Medical Work up, SNF Pending bed offer  Expected Discharge Plan and Services In-house Referral: Clinical Social Work   Post Acute Care Choice: Skilled Nursing Facility Living arrangements for the past 2 months: Single Family Home Expected Discharge Date: 09/19/22                                     Social Determinants of Health (SDOH) Interventions SDOH Screenings   Food Insecurity: No Food Insecurity (03/13/2022)  Housing: Low Risk  (03/13/2022)  Transportation Needs: No Transportation Needs (03/13/2022)  Depression (PHQ2-9): Low Risk  (04/12/2022)  Financial Resource Strain: Low Risk  (03/13/2022)  Physical Activity: Inactive (03/13/2022)  Social Connections: Moderately Integrated (03/13/2022)  Stress: Stress Concern Present (03/13/2022)  Tobacco Use: Low Risk  (09/13/2022)    Readmission Risk Interventions     No data to display

## 2022-09-19 NOTE — Discharge Summary (Signed)
Patient ID: Jessica [name] MRN: [id number] DOB/AGE: 1950/06/05 72 y.o.  Admit date: 09/12/2022 Discharge date: 09/19/2022  Admission Diagnoses:  Principal Problem:   Status post total right knee replacement Active Problems:   Unilateral primary osteoarthritis, right knee   S/P TKR (total knee replacement)   Discharge Diagnoses:  Same  Past Medical History:  Diagnosis Date   Anxiety    Arthritis    bilateral knees   Asthma    childhood, inhaler for wheezing PRN   Autonomic neuropathy    diabetic   Cataract    Celiac disease    Constipation    Depression    Diabetes mellitus    type II, Hemoglobin A1C 9.9 10/05/2011   Diabetic neuropathy (HCC)    Diverticulosis    Fatty liver    Gastroparesis    GERD (gastroesophageal reflux disease)    History of claustrophobia    with MRI tests   Hyperlipidemia    Hypertension    IBS (irritable bowel syndrome)    Kidney stones    Personal history of colonic polyps 03/2010   hyperplastic   PONV (postoperative nausea and vomiting)    Shingles 2021    Surgeries: Procedure(s): RIGHT TOTAL KNEE ARTHROPLASTY on 09/12/2022   Consultants:   Discharged Condition: Improved  Hospital Course: Jessica [name] is an 72 y.o. female who was admitted 09/12/2022 for operative treatment ofStatus post total right knee replacement. Patient has severe unremitting pain that affects sleep, daily activities, and work/hobbies. After pre-op clearance the patient was taken to the operating room on 09/12/2022 and underwent  Procedure(s): RIGHT TOTAL KNEE ARTHROPLASTY.    Patient was given perioperative antibiotics:  Anti-infectives (From admission, onward)    Start     Dose/Rate Route Frequency Ordered Stop   09/13/22 1000  valACYclovir (VALTREX) tablet 500 mg        500 mg Oral Daily 09/12/22 1200     09/12/22 1330  ceFAZolin (ANCEF) IVPB 1 g/50 mL premix        1 g 100 mL/hr over 30 Minutes Intravenous Every 6 hours 09/12/22 1148 09/12/22 1952    09/12/22 0600  ceFAZolin (ANCEF) IVPB 2g/100 mL premix        2 g 200 mL/hr over 30 Minutes Intravenous On call to O.R. 09/12/22 4098 09/12/22 0806        Patient was given sequential compression devices, early ambulation, and chemoprophylaxis to prevent DVT.  Patient benefited maximally from hospital stay and there were no complications.    Recent vital signs: Patient Vitals for the past 24 hrs:  BP Temp Temp src Pulse Resp SpO2  09/19/22 0718 (!) 139/52 98.6 F (37 C) Oral 97 16 98 %  09/19/22 0456 (!) 153/65 98.3 F (36.8 C) Oral 78 18 99 %  09/18/22 2009 (!) 152/63 98.4 F (36.9 C) Oral 92 18 99 %  09/18/22 1615 139/66 98.2 F (36.8 C) Oral 100 18 100 %     Recent laboratory studies: No results for input(s): "WBC", "HGB", "HCT", "PLT", "NA", "K", "CL", "CO2", "BUN", "CREATININE", "GLUCOSE", "INR", "CALCIUM" in the last 72 hours.  Invalid input(s): "PT", "2"   Discharge Medications:   Allergies as of 09/19/2022       Reactions   Codeine Nausea And Vomiting   Gluten Meal Other (See Comments)   No wheat or soy   Metronidazole Nausea And Vomiting   Nsaids Nausea And Vomiting, Other (See Comments)   Cannot tolerate large doses d/t Celiac disease  Soy Allergy    Wheat         Medication List     STOP taking these medications    aspirin EC 81 MG tablet   ibuprofen 200 MG tablet Commonly known as: ADVIL   tiZANidine 2 MG tablet Commonly known as: ZANAFLEX       TAKE these medications    albuterol 108 (90 Base) MCG/ACT inhaler Commonly known as: VENTOLIN HFA TAKE 2 PUFFS BY MOUTH EVERY 6 HOURS AS NEEDED FOR WHEEZE OR SHORTNESS OF BREATH   apixaban 2.5 MG Tabs tablet Commonly known as: ELIQUIS Take 1 tablet (2.5 mg total) by mouth every 12 (twelve) hours.   atorvastatin 40 MG tablet Commonly known as: LIPITOR TAKE 1 TABLET BY MOUTH EVERY DAY   dexlansoprazole 60 MG capsule Commonly known as: DEXILANT TAKE 1 CAPSULE BY MOUTH EVERY DAY    DULoxetine 20 MG capsule Commonly known as: CYMBALTA TAKE 1 CAPSULE BY MOUTH EVERY DAY   furosemide 20 MG tablet Commonly known as: LASIX TAKE 1 TABLET (20 MG TOTAL) BY MOUTH DAILY AS NEEDED FOR FLUID OR EDEMA. What changed: See the new instructions.   gabapentin 600 MG tablet Commonly known as: NEURONTIN TAKE 1 TABLET IN THE MORNING AND 2 TABLETS AT BEDTIME   insulin lispro 100 UNIT/ML KwikPen Commonly known as: HumaLOG KwikPen Inject 5-9 Units into the skin 2 (two) times daily before a meal. What changed: how much to take   Insulin Pen Needle 32G X 4 MM Misc Use 3-4x a day   Insulin Syringe-Needle U-100 31G X 5/16" 1 ML Misc Commonly known as: BD Insulin Syringe U/F USE UP TO 3 TIMES A DAY   ketoconazole 2 % cream Commonly known as: NIZORAL APPLY TWICE A DAY FOR 2 WEEKS AS NEEDED FOR RASH   Lantus SoloStar 100 UNIT/ML Solostar Pen Generic drug: insulin glargine Inject 30 Units into the skin at bedtime.   methocarbamol 500 MG tablet Commonly known as: ROBAXIN Take 1 tablet (500 mg total) by mouth every 6 (six) hours as needed for muscle spasms.   olmesartan 40 MG tablet Commonly known as: BENICAR TAKE 1 TABLET BY MOUTH EVERY DAY   ondansetron 4 MG disintegrating tablet Commonly known as: Zofran ODT Take 1 tablet (4 mg total) by mouth every 8 (eight) hours as needed for nausea or vomiting.   OneTouch Ultra test strip Generic drug: glucose blood USE 2 TIMES DAILY. AND LANCETS 2/DAY   oxyCODONE 5 MG immediate release tablet Commonly known as: Oxy IR/ROXICODONE Take 1-2 tablets (5-10 mg total) by mouth every 4 (four) hours as needed for moderate pain (pain score 4-6).   polyethylene glycol powder 17 GM/SCOOP powder Commonly known as: [employer] Purelax DISSOLVE 1 CAPFUL IN AT LEAST 8 OUNCES WATER/JUICE AND DRINK TWICE DAILY What changed:  how much to take how to take this when to take this additional instructions   sucralfate 1 GM/10ML suspension Commonly known  as: CARAFATE Take 10 mLs (1 g total) by mouth 4 (four) times daily. What changed:  when to take this reasons to take this   Trulicity 0.75 MG/0.5ML Sopn Generic drug: Dulaglutide Inject into the skin weekly   valACYclovir 500 MG tablet Commonly known as: VALTREX TAKE 1 TABLET BY MOUTH EVERY DAY   Vitamin D (Ergocalciferol) 1.25 MG (50000 UNIT) Caps capsule Commonly known as: DRISDOL TAKE 1 CAPSULE (50,000 UNITS TOTAL) BY MOUTH EVERY 30 DAYS  Durable Medical Equipment  (From admission, onward)           Start     Ordered   09/12/22 1202  DME 3 n 1  Once        09/12/22 1201   09/12/22 1202  DME Walker rolling  Once       Question Answer Comment  Walker: With 5 Inch Wheels   Patient needs a walker to treat with the following condition Status post total right knee replacement      09/12/22 1201            Diagnostic Studies: DG Knee Right Port  Result Date: 09/12/2022 CLINICAL DATA:  [id number] Status post total right knee replacement [id number] EXAM: PORTABLE RIGHT KNEE - 1-2 VIEW COMPARISON:  Knee radiograph 05/29/2022 FINDINGS: Postsurgical changes of right total knee arthroplasty. Normal alignment. No evidence of loosening or periprosthetic fracture. Expected soft tissue changes. IMPRESSION: Postsurgical changes of right total knee arthroplasty. No evidence of immediate hardware complication. Electronically Signed   By: Caprice Renshaw M.D.   On: 09/12/2022 09:58    Disposition: Discharge disposition: 03-Skilled Nursing Facility       Discharge Instructions     Diet - low sodium heart healthy   Complete by: As directed    Increase activity slowly   Complete by: As directed         Follow-up Information     Kathryne Hitch, MD Follow up in 2 week(s).   Specialty: Orthopedic Surgery Contact information: [address] [place] [address] [phone]                  Signed: Kathryne Hitch 09/19/2022, 9:16  AM

## 2022-09-20 LAB — GLUCOSE, CAPILLARY
Glucose-Capillary: 256 mg/dL — ABNORMAL HIGH (ref 70–99)
Glucose-Capillary: 241 mg/dL — ABNORMAL HIGH (ref 70–99)

## 2022-09-20 NOTE — Plan of Care (Signed)
  Problem: Education: Goal: Knowledge of General Education information will improve Description: Including pain rating scale, medication(s)/side effects and non-pharmacologic comfort measures Outcome: Adequate for Discharge   Problem: Health Behavior/Discharge Planning: Goal: Ability to manage health-related needs will improve Outcome: Adequate for Discharge   Problem: Clinical Measurements: Goal: Ability to maintain clinical measurements within normal limits will improve Outcome: Adequate for Discharge Goal: Will remain free from infection Outcome: Adequate for Discharge Goal: Diagnostic test results will improve Outcome: Adequate for Discharge Goal: Respiratory complications will improve Outcome: Adequate for Discharge Goal: Cardiovascular complication will be avoided Outcome: Adequate for Discharge   Problem: Activity: Goal: Risk for activity intolerance will decrease Outcome: Adequate for Discharge   Problem: Nutrition: Goal: Adequate nutrition will be maintained Outcome: Adequate for Discharge   Problem: Coping: Goal: Level of anxiety will decrease Outcome: Adequate for Discharge   Problem: Elimination: Goal: Will not experience complications related to bowel motility Outcome: Adequate for Discharge Goal: Will not experience complications related to urinary retention Outcome: Adequate for Discharge   Problem: Pain Managment: Goal: General experience of comfort will improve Outcome: Adequate for Discharge   Problem: Safety: Goal: Ability to remain free from injury will improve Outcome: Adequate for Discharge   Problem: Skin Integrity: Goal: Risk for impaired skin integrity will decrease Outcome: Adequate for Discharge   Problem: Education: Goal: Ability to describe self-care measures that may prevent or decrease complications (Diabetes Survival Skills Education) will improve Outcome: Adequate for Discharge Goal: Individualized Educational Video(s) Outcome:  Adequate for Discharge   Problem: Coping: Goal: Ability to adjust to condition or change in health will improve Outcome: Adequate for Discharge   Problem: Fluid Volume: Goal: Ability to maintain a balanced intake and output will improve Outcome: Adequate for Discharge   Problem: Health Behavior/Discharge Planning: Goal: Ability to identify and utilize available resources and services will improve Outcome: Adequate for Discharge Goal: Ability to manage health-related needs will improve Outcome: Adequate for Discharge   Problem: Metabolic: Goal: Ability to maintain appropriate glucose levels will improve Outcome: Adequate for Discharge   Problem: Nutritional: Goal: Maintenance of adequate nutrition will improve Outcome: Adequate for Discharge Goal: Progress toward achieving an optimal weight will improve Outcome: Adequate for Discharge   Problem: Skin Integrity: Goal: Risk for impaired skin integrity will decrease Outcome: Adequate for Discharge   Problem: Tissue Perfusion: Goal: Adequacy of tissue perfusion will improve Outcome: Adequate for Discharge   Problem: Education: Goal: Knowledge of the prescribed therapeutic regimen will improve Outcome: Adequate for Discharge Goal: Individualized Educational Video(s) Outcome: Adequate for Discharge   Problem: Activity: Goal: Ability to avoid complications of mobility impairment will improve Outcome: Adequate for Discharge Goal: Range of joint motion will improve Outcome: Adequate for Discharge   Problem: Clinical Measurements: Goal: Postoperative complications will be avoided or minimized Outcome: Adequate for Discharge   Problem: Pain Management: Goal: Pain level will decrease with appropriate interventions Outcome: Adequate for Discharge   Problem: Skin Integrity: Goal: Will show signs of wound healing Outcome: Adequate for Discharge

## 2022-09-20 NOTE — TOC Progression Note (Addendum)
Transition of Care [facility]) - Progression Note    Patient Details  Name: Jessica [name] MRN: [id number] Date of Birth: 05-14-1950  [facility]) CM/SW Contact  Lorri Frederick, LCSW Phone Number: 09/20/2022, 9:44 AM  Clinical Narrative:   CSW informed that [facility] does not have available bed.  Pt updated and wants to accept offer at [facility].  CSW confirmed with [facility] that they can accept pt today.  Facility choice provided to Vesta Mixer approved: [id number], 3 days, 5/22-5/24.    Per Haley/PT pt does not require ambulance tranport.  1020: CSW spoke with [place], pt niece.  She called [facility] today and was told by Elmarie Shiley that they may have a discharge today.  Updated her that I was told yesterday they had no beds and pt has agreed to go to [facility].  Gala Murdoch will be at the hospital at noon, will meet then.  CSW spoke with [facility].  Her discharge is not happening, so she does not have available bed.  She will call Gala Murdoch and inform her.  1210: CSW spoke with pt, niece, and son in pt room.  Confirmed no beds at [facility], plan to DC to [facility].  Discussed transport, they do not want to transport by car.  Discussed that there is potential that they will be billed for this, pt acknowledges and would like to move forward with [facility] transportation, which was done.   1425: Niece sent message asking how long pt approved for SNF, CSW informed of initial auth dates, 5/22-5/24.  Niece quite irritated and pt talking loudly in background.  CSW attempted to explain this is the initial Berkley Harvey, Joetta Manners will submit more info and more days will be approved at that time.  They abruptly hung up.    Expected Discharge Plan: Skilled Nursing Facility Barriers to Discharge: Continued Medical Work up, SNF Pending bed offer  Expected Discharge Plan and Services In-house Referral: Clinical Social Work   Post Acute Care Choice: Skilled  Nursing Facility Living arrangements for the past 2 months: Single Family Home Expected Discharge Date: 09/20/22                                     Social Determinants of Health (SDOH) Interventions SDOH Screenings   Food Insecurity: No Food Insecurity (03/13/2022)  Housing: Low Risk  (03/13/2022)  Transportation Needs: No Transportation Needs (03/13/2022)  Depression (PHQ2-9): Low Risk  (04/12/2022)  Financial Resource Strain: Low Risk  (03/13/2022)  Physical Activity: Inactive (03/13/2022)  Social Connections: Moderately Integrated (03/13/2022)  Stress: Stress Concern Present (03/13/2022)  Tobacco Use: Low Risk  (09/13/2022)    Readmission Risk Interventions     No data to display

## 2022-09-20 NOTE — Progress Notes (Signed)
Patient ID: Jessica [name], female   DOB: 07/23/1950, 72 y.o.   MRN: [id number] Vital signs are stable.  The right operative knee is stable.  The patient is now over a week out from her right knee replacement.  Still awaiting short-term skilled nursing placement.  The patient no longer needs an acute hospital bed.  Will once again put in discharge orders and discharge summary for the patient with the hopes of discharge soon to a skilled nursing facility.

## 2022-09-20 NOTE — Discharge Summary (Signed)
Patient ID: Jessica [name] MRN: [id number] DOB/AGE: 1950/12/17 72 y.o.  Admit date: 09/12/2022 Discharge date: 09/20/2022  Admission Diagnoses:  Principal Problem:   Status post total right knee replacement Active Problems:   Unilateral primary osteoarthritis, right knee   S/P TKR (total knee replacement)   Discharge Diagnoses:  Same  Past Medical History:  Diagnosis Date   Anxiety    Arthritis    bilateral knees   Asthma    childhood, inhaler for wheezing PRN   Autonomic neuropathy    diabetic   Cataract    Celiac disease    Constipation    Depression    Diabetes mellitus    type II, Hemoglobin A1C 9.9 10/05/2011   Diabetic neuropathy (HCC)    Diverticulosis    Fatty liver    Gastroparesis    GERD (gastroesophageal reflux disease)    History of claustrophobia    with MRI tests   Hyperlipidemia    Hypertension    IBS (irritable bowel syndrome)    Kidney stones    Personal history of colonic polyps 03/2010   hyperplastic   PONV (postoperative nausea and vomiting)    Shingles 2021    Surgeries: Procedure(s): RIGHT TOTAL KNEE ARTHROPLASTY on 09/12/2022   Consultants:   Discharged Condition: Improved  Hospital Course: Jessica [name] is an 72 y.o. female who was admitted 09/12/2022 for operative treatment ofStatus post total right knee replacement. Patient has severe unremitting pain that affects sleep, daily activities, and work/hobbies. After pre-op clearance the patient was taken to the operating room on 09/12/2022 and underwent  Procedure(s): RIGHT TOTAL KNEE ARTHROPLASTY.    Patient was given perioperative antibiotics:  Anti-infectives (From admission, onward)    Start     Dose/Rate Route Frequency Ordered Stop   09/13/22 1000  valACYclovir (VALTREX) tablet 500 mg        500 mg Oral Daily 09/12/22 1200     09/12/22 1330  ceFAZolin (ANCEF) IVPB 1 g/50 mL premix        1 g 100 mL/hr over 30 Minutes Intravenous Every 6 hours 09/12/22 1148 09/12/22 1952    09/12/22 0600  ceFAZolin (ANCEF) IVPB 2g/100 mL premix        2 g 200 mL/hr over 30 Minutes Intravenous On call to O.R. 09/12/22 4132 09/12/22 0806        Patient was given sequential compression devices, early ambulation, and chemoprophylaxis to prevent DVT.  Patient benefited maximally from hospital stay and there were no complications.    Recent vital signs: Patient Vitals for the past 24 hrs:  BP Temp Temp src Pulse Resp SpO2  09/20/22 0512 (!) 139/52 98.7 F (37.1 C) Oral 85 17 98 %  09/19/22 2018 (!) 140/62 98.9 F (37.2 C) Oral 89 17 100 %  09/19/22 1340 (!) 133/55 98.6 F (37 C) Oral 91 17 96 %  09/19/22 0718 (!) 139/52 98.6 F (37 C) Oral 97 16 98 %     Recent laboratory studies: No results for input(s): "WBC", "HGB", "HCT", "PLT", "NA", "K", "CL", "CO2", "BUN", "CREATININE", "GLUCOSE", "INR", "CALCIUM" in the last 72 hours.  Invalid input(s): "PT", "2"   Discharge Medications:   Allergies as of 09/20/2022       Reactions   Codeine Nausea And Vomiting   Gluten Meal Other (See Comments)   No wheat or soy   Metronidazole Nausea And Vomiting   Nsaids Nausea And Vomiting, Other (See Comments)   Cannot tolerate large doses d/t Celiac  disease   Soy Allergy    Wheat         Medication List     STOP taking these medications    aspirin EC 81 MG tablet   ibuprofen 200 MG tablet Commonly known as: ADVIL   tiZANidine 2 MG tablet Commonly known as: ZANAFLEX       TAKE these medications    albuterol 108 (90 Base) MCG/ACT inhaler Commonly known as: VENTOLIN HFA TAKE 2 PUFFS BY MOUTH EVERY 6 HOURS AS NEEDED FOR WHEEZE OR SHORTNESS OF BREATH   apixaban 2.5 MG Tabs tablet Commonly known as: ELIQUIS Take 1 tablet (2.5 mg total) by mouth every 12 (twelve) hours.   atorvastatin 40 MG tablet Commonly known as: LIPITOR TAKE 1 TABLET BY MOUTH EVERY DAY   dexlansoprazole 60 MG capsule Commonly known as: DEXILANT TAKE 1 CAPSULE BY MOUTH EVERY DAY    DULoxetine 20 MG capsule Commonly known as: CYMBALTA TAKE 1 CAPSULE BY MOUTH EVERY DAY   furosemide 20 MG tablet Commonly known as: LASIX TAKE 1 TABLET (20 MG TOTAL) BY MOUTH DAILY AS NEEDED FOR FLUID OR EDEMA. What changed: See the new instructions.   gabapentin 600 MG tablet Commonly known as: NEURONTIN TAKE 1 TABLET IN THE MORNING AND 2 TABLETS AT BEDTIME   insulin lispro 100 UNIT/ML KwikPen Commonly known as: HumaLOG KwikPen Inject 5-9 Units into the skin 2 (two) times daily before a meal. What changed: how much to take   Insulin Pen Needle 32G X 4 MM Misc Use 3-4x a day   Insulin Syringe-Needle U-100 31G X 5/16" 1 ML Misc Commonly known as: BD Insulin Syringe U/F USE UP TO 3 TIMES A DAY   ketoconazole 2 % cream Commonly known as: NIZORAL APPLY TWICE A DAY FOR 2 WEEKS AS NEEDED FOR RASH   Lantus SoloStar 100 UNIT/ML Solostar Pen Generic drug: insulin glargine Inject 30 Units into the skin at bedtime.   methocarbamol 500 MG tablet Commonly known as: ROBAXIN Take 1 tablet (500 mg total) by mouth every 6 (six) hours as needed for muscle spasms.   olmesartan 40 MG tablet Commonly known as: BENICAR TAKE 1 TABLET BY MOUTH EVERY DAY   ondansetron 4 MG disintegrating tablet Commonly known as: Zofran ODT Take 1 tablet (4 mg total) by mouth every 8 (eight) hours as needed for nausea or vomiting.   OneTouch Ultra test strip Generic drug: glucose blood USE 2 TIMES DAILY. AND LANCETS 2/DAY   oxyCODONE 5 MG immediate release tablet Commonly known as: Oxy IR/ROXICODONE Take 1-2 tablets (5-10 mg total) by mouth every 4 (four) hours as needed for moderate pain (pain score 4-6).   polyethylene glycol powder 17 GM/SCOOP powder Commonly known as: [employer] Purelax DISSOLVE 1 CAPFUL IN AT LEAST 8 OUNCES WATER/JUICE AND DRINK TWICE DAILY What changed:  how much to take how to take this when to take this additional instructions   sucralfate 1 GM/10ML suspension Commonly known  as: CARAFATE Take 10 mLs (1 g total) by mouth 4 (four) times daily. What changed:  when to take this reasons to take this   Trulicity 0.75 MG/0.5ML Sopn Generic drug: Dulaglutide Inject into the skin weekly   valACYclovir 500 MG tablet Commonly known as: VALTREX TAKE 1 TABLET BY MOUTH EVERY DAY   Vitamin D (Ergocalciferol) 1.25 MG (50000 UNIT) Caps capsule Commonly known as: DRISDOL TAKE 1 CAPSULE (50,000 UNITS TOTAL) BY MOUTH EVERY 30 DAYS  Durable Medical Equipment  (From admission, onward)           Start     Ordered   09/12/22 1202  DME 3 n 1  Once        09/12/22 1201   09/12/22 1202  DME Walker rolling  Once       Question Answer Comment  Walker: With 5 Inch Wheels   Patient needs a walker to treat with the following condition Status post total right knee replacement      09/12/22 1201            Diagnostic Studies: DG Knee Right Port  Result Date: 09/12/2022 CLINICAL DATA:  [id number] Status post total right knee replacement [id number] EXAM: PORTABLE RIGHT KNEE - 1-2 VIEW COMPARISON:  Knee radiograph 05/29/2022 FINDINGS: Postsurgical changes of right total knee arthroplasty. Normal alignment. No evidence of loosening or periprosthetic fracture. Expected soft tissue changes. IMPRESSION: Postsurgical changes of right total knee arthroplasty. No evidence of immediate hardware complication. Electronically Signed   By: Caprice Renshaw M.D.   On: 09/12/2022 09:58    Disposition: Discharge disposition: 03-Skilled Nursing Facility       Discharge Instructions     Diet - low sodium heart healthy   Complete by: As directed    Increase activity slowly   Complete by: As directed         Follow-up Information     Kathryne Hitch, MD Follow up in 2 week(s).   Specialty: Orthopedic Surgery Contact information: [address] [place] [address] [phone]                  Signed: Kathryne Hitch 09/20/2022, 7:16  AM

## 2022-09-20 NOTE — Progress Notes (Signed)
Patient discharged, report called and given to [facility] RN at [facility]. No additional questions and or concerns at this time. Patient IV removed per protocol. Patient awaiting [facility] arrival for discharge.

## 2022-09-20 NOTE — TOC Transition Note (Signed)
Transition of Care [facility]) - CM/SW Discharge Note   Patient Details  Name: Jessica [name] MRN: [id number] Date of Birth: [birth year]  Transition of Care [facility]) CM/SW Contact:  Lorri Frederick, LCSW Phone Number: 09/20/2022, 12:38 PM   Clinical Narrative:   Pt discharging to [facility], room 3243.  RN call report to [phone].     Final next level of care: Skilled Nursing Facility Barriers to Discharge: Barriers Resolved   Patient Goals and CMS Choice   Choice offered to / list presented to : Patient  Discharge Placement                Patient chooses bed at: [facility] Patient to be transferred to facility by: [facility] Name of family member notified: pt niece Elita Quick and son both in room Patient and family notified of of transfer: 09/20/22  Discharge Plan and Services Additional resources added to the After Visit Summary for   In-house Referral: Clinical Social Work   Post Acute Care Choice: Skilled Nursing Facility                               Social Determinants of Health (SDOH) Interventions SDOH Screenings   Food Insecurity: No Food Insecurity (03/13/2022)  Housing: Low Risk  (03/13/2022)  Transportation Needs: No Transportation Needs (03/13/2022)  Depression (PHQ2-9): Low Risk  (04/12/2022)  Financial Resource Strain: Low Risk  (03/13/2022)  Physical Activity: Inactive (03/13/2022)  Social Connections: Moderately Integrated (03/13/2022)  Stress: Stress Concern Present (03/13/2022)  Tobacco Use: Low Risk  (09/13/2022)     Readmission Risk Interventions     No data to display

## 2022-09-22 ENCOUNTER — Ambulatory Visit: Payer: Medicare Other | Admitting: Internal Medicine

## 2022-09-27 ENCOUNTER — Ambulatory Visit (INDEPENDENT_AMBULATORY_CARE_PROVIDER_SITE_OTHER): Payer: Medicare Other | Admitting: Orthopaedic Surgery

## 2022-09-27 ENCOUNTER — Encounter: Payer: Self-pay | Admitting: Orthopaedic Surgery

## 2022-09-27 DIAGNOSIS — Z96651 Presence of right artificial knee joint: Secondary | ICD-10-CM

## 2022-09-27 NOTE — Progress Notes (Signed)
The patient comes in today for first postoperative follow-up status post a right total knee arthroplasty.  She is 72 years old.  She has been staying in a nursing facility as she recovers and surgery.  She feels like she can be discharged from there.  On exam she lacks full extension by about 3 degrees and I can flex her to about 85 degrees.  The incision looks good and the staples been removed and Steri-Strips applied.  She has been on Eliquis twice a day his blood thinning covers for DVTs.  Her calf is soft.  She can stop her Eliquis and output notes from the skilled nursing facility to stop Eliquis.  From my standpoint she needs to push her range of motion of that knee whether through home health or eventually outpatient physical therapy.  I would like to see her back in 4 weeks for repeat exam to assess her range of motion but no x-rays are needed.

## 2022-09-29 ENCOUNTER — Other Ambulatory Visit: Payer: Self-pay

## 2022-09-29 DIAGNOSIS — Z01818 Encounter for other preprocedural examination: Secondary | ICD-10-CM

## 2022-09-29 DIAGNOSIS — Z96651 Presence of right artificial knee joint: Secondary | ICD-10-CM

## 2022-10-01 ENCOUNTER — Other Ambulatory Visit: Payer: Self-pay | Admitting: Bariatrics

## 2022-10-01 DIAGNOSIS — E1169 Type 2 diabetes mellitus with other specified complication: Secondary | ICD-10-CM

## 2022-10-01 DIAGNOSIS — E669 Obesity, unspecified: Secondary | ICD-10-CM

## 2022-10-02 ENCOUNTER — Telehealth: Payer: Self-pay

## 2022-10-02 NOTE — Transitions of Care (Post Inpatient/ED Visit) (Signed)
10/02/2022  Name: Jessica [name] MRN: [id number] DOB: 1951/02/25  Today's TOC FU Call Status: Today's TOC FU Call Status:: Successful TOC FU Call Competed TOC FU Call Complete Date: 10/02/22  Transition Care Management Follow-up Telephone Call Date of Discharge: 09/29/22 Discharge Facility: Other ([facility]) Name of Other ([facility]) [facility]charge Facility: [facility] Type of Discharge: Inpatient Admission Primary Inpatient Discharge Diagnosis:: rehab How have you been since you were released from the hospital?: Better Any questions or concerns?: No  Items Reviewed: Did you receive and understand the discharge instructions provided?: Yes Medications obtained,verified, and reconciled?: Yes (Medications Reviewed) Any new allergies since your discharge?: No Dietary orders reviewed?: Yes Do you have support at home?: Yes  Medications Reviewed Today: Medications Reviewed Today     Reviewed by Kathryne Hitch, MD (Physician) on 09/27/22 at 1351  Med List Status: <None>   Medication Order Taking? Sig Documenting Provider Last Dose Status Informant  albuterol (VENTOLIN HFA) 108 (90 Base) MCG/ACT inhaler [id number] No TAKE 2 PUFFS BY MOUTH EVERY 6 HOURS AS NEEDED FOR WHEEZE OR SHORTNESS OF BREATH Willow Ora, MD Past Month Active Self  apixaban (ELIQUIS) 2.5 MG TABS tablet [id number]  Take 1 tablet (2.5 mg total) by mouth every 12 (twelve) hours. Kathryne Hitch, MD  Active   atorvastatin (LIPITOR) 40 MG tablet [id number] No TAKE 1 TABLET BY MOUTH EVERY DAY Willow Ora, MD 09/12/2022 0400 Active Self  dexlansoprazole (DEXILANT) 60 MG capsule [id number] No TAKE 1 CAPSULE BY MOUTH EVERY DAY Pyrtle, Carie Caddy, MD 09/12/2022 0400 Active Self  Dulaglutide (TRULICITY) 0.75 MG/0.5ML SOPN [id number] No Inject into the skin weekly Corinna Capra A, DO 09/04/2022 Active   DULoxetine (CYMBALTA) 20 MG capsule [id number]  TAKE 1 CAPSULE BY MOUTH EVERY DAY Antoine Primas M, DO  Active    furosemide (LASIX) 20 MG tablet [id number] No TAKE 1 TABLET (20 MG TOTAL) BY MOUTH DAILY AS NEEDED FOR FLUID OR EDEMA.  Patient taking differently: Take 20 mg by mouth daily.   Willow Ora, MD 09/11/2022 Active Self  gabapentin (NEURONTIN) 600 MG tablet [id number] No TAKE 1 TABLET IN THE MORNING AND 2 TABLETS AT BEDTIME Ernestene Kiel T, DPM 09/12/2022 0400 Active Self  glucose blood (ONETOUCH ULTRA) test strip [id number] No USE 2 TIMES DAILY. AND LANCETS 2/DAY Carlus Pavlov, MD Taking Active Self  insulin glargine (LANTUS SOLOSTAR) 100 UNIT/ML Solostar Pen [id number] No Inject 30 Units into the skin at bedtime. Carlus Pavlov, MD 09/11/2022 Active Self           Med Note Katrinka Blazing, Santina Evans D   Tue Sep 12, 2022  6:15 AM) Pt. Took 15 units  insulin lispro (HUMALOG KWIKPEN) 100 UNIT/ML KwikPen [id number] No Inject 5-9 Units into the skin 2 (two) times daily before a meal.  Patient taking differently: Inject 7-9 Units into the skin 2 (two) times daily before a meal.   Carlus Pavlov, MD 09/11/2022 2000 Active Self           Med Note Katrinka Blazing, Santina Evans D   Tue Sep 12, 2022  6:16 AM) Pt. Took 8 units  Insulin Pen Needle 32G X 4 MM MISC [id number] No Use 3-4x a day Carlus Pavlov, MD Taking Active Self  Insulin Syringe-Needle U-100 (BD INSULIN SYRINGE U/F) 31G X 5/16" 1 ML MISC [id number] No USE UP TO 3 TIMES A Sammuel Bailiff, MD Taking Active Self  ketoconazole (NIZORAL) 2 % cream [id number] No APPLY TWICE A DAY FOR 2 WEEKS AS  NEEDED FOR Karl Ito, MD Past Month Active Self  methocarbamol (ROBAXIN) 500 MG tablet [id number]  Take 1 tablet (500 mg total) by mouth every 6 (six) hours as needed for muscle spasms. Kathryne Hitch, MD  Active   olmesartan [facility]) 40 MG tablet [id number] No TAKE 1 TABLET BY MOUTH EVERY DAY Willow Ora, MD 09/11/2022 Active Self  ondansetron (ZOFRAN ODT) 4 MG disintegrating tablet [id number] No Take 1 tablet (4 mg total) by mouth every 8 (eight)  hours as needed for nausea or vomiting. Corinna Capra A, DO 09/12/2022 0400 Active Self  oxyCODONE (OXY IR/ROXICODONE) 5 MG immediate release tablet [id number]  Take 1-2 tablets (5-10 mg total) by mouth every 4 (four) hours as needed for moderate pain (pain score 4-6). Kathryne Hitch, MD  Active   polyethylene glycol powder ([employer]S PURELAX) 17 GM/SCOOP powder [id number] No DISSOLVE 1 CAPFUL IN AT LEAST 8 OUNCES WATER/JUICE AND DRINK TWICE DAILY  Patient taking differently: Take 17 g by mouth at bedtime.   Beverley Fiedler, MD 09/11/2022 Active Self  sucralfate (CARAFATE) 1 GM/10ML suspension [id number] No Take 10 mLs (1 g total) by mouth 4 (four) times daily.  Patient taking differently: Take 1 g by mouth daily as needed (nausea).   Beverley Fiedler, MD More than a month Active Self  valACYclovir (VALTREX) 500 MG tablet [id number] No TAKE 1 TABLET BY MOUTH EVERY DAY Willow Ora, MD 09/12/2022 0400 Active Self  Vitamin D, Ergocalciferol, (DRISDOL) 1.25 MG (50000 UNIT) CAPS capsule [id number] No TAKE 1 CAPSULE (50,000 UNITS TOTAL) BY MOUTH EVERY 30 DAYS Roswell Nickel, DO Past Month Active             Home Care and Equipment/Supplies: Were Home Health Services Ordered?: Yes Name of Home Health Agency:: [facility] Has Agency set up a time to come to your home?: No Any new equipment or medical supplies ordered?: No  Functional Questionnaire: Do you need assistance with bathing/showering or dressing?: Yes (niece assists) Do you need assistance with meal preparation?: No Do you need assistance with eating?: No Do you have difficulty maintaining continence: No Do you need assistance with getting out of bed/getting out of a chair/moving?: Yes (niece assists) Do you have difficulty managing or taking your medications?: No  Follow up appointments reviewed: PCP Follow-up appointment confirmed?: Yes Date of PCP follow-up appointment?: 10/09/22 Follow-up Provider: [facility] Follow-up appointment confirmed?: Yes Date of Specialist follow-up appointment?: 10/30/22 Follow-Up Specialty Provider:: Dr Rayburn Ma Do you need transportation to your follow-up appointment?: No Do you understand care options if your condition(s) worsen?: Yes-patient verbalized understanding    SIGNATURE  Woodfin Ganja LPN [facility] Nurse Health Advisor Direct Dial [phone]

## 2022-10-03 ENCOUNTER — Telehealth: Payer: Self-pay | Admitting: Orthopaedic Surgery

## 2022-10-03 NOTE — Telephone Encounter (Signed)
Received call from Lindsay-nurse with [facility] needing verbal orders for skilled nursing 1 Wk 5, 2 PRN visits.  Lillia Abed said patient has stage 2 pressure ulcer on her left buttocks. Lillia Abed asked if she can use calcium Alginate and foam dressing daily. Lillia Abed said patient's blood sugar is 403-took sliding scale insulin. Lillia Abed asked if it os ok to take orders from patient PCP?  The number to contact Lillia Abed is [phone]

## 2022-10-03 NOTE — Telephone Encounter (Signed)
Called Jessica [name] with the below orders

## 2022-10-04 ENCOUNTER — Telehealth: Payer: Self-pay | Admitting: Family Medicine

## 2022-10-04 NOTE — Telephone Encounter (Signed)
Home Health Verbal Orders  Agency:  [facility]  Caller: Sharman Cheek, PT  Requesting OT/ PT/ Skilled nursing/ Social Work/ Speech:  PT  Reason for Request:  Strength, balance, and endurance  Frequency: 2 x 1 week--did 1st visit today, will do 2nd this week if approved this week 3 x 1 week 2 x 1 week 1 x1 week   HH needs F2F w/in last 30 days

## 2022-10-04 NOTE — Telephone Encounter (Signed)
Spoke with Vernona Rieger to go head and move forward with verbal orders for pt.

## 2022-10-04 NOTE — Telephone Encounter (Signed)
LVM for Jessica [name] for pt to F/U with this with pt's endocrinologist

## 2022-10-04 NOTE — Telephone Encounter (Signed)
Caller states she checked patient's vitals yesterday and her blood sugar was 403. States pt has no sx and is on sliding scale regimen. For additional questions, Lillia Abed can be reached @ [phone].

## 2022-10-09 ENCOUNTER — Encounter: Payer: Self-pay | Admitting: Family Medicine

## 2022-10-09 ENCOUNTER — Telehealth: Payer: Self-pay | Admitting: Family Medicine

## 2022-10-09 ENCOUNTER — Ambulatory Visit (INDEPENDENT_AMBULATORY_CARE_PROVIDER_SITE_OTHER): Payer: Medicare Other | Admitting: Family Medicine

## 2022-10-09 VITALS — BP 138/70 | HR 97 | Temp 97.8°F | Ht 64.0 in

## 2022-10-09 DIAGNOSIS — I10 Essential (primary) hypertension: Secondary | ICD-10-CM | POA: Diagnosis not present

## 2022-10-09 DIAGNOSIS — E782 Mixed hyperlipidemia: Secondary | ICD-10-CM | POA: Diagnosis not present

## 2022-10-09 DIAGNOSIS — E1169 Type 2 diabetes mellitus with other specified complication: Secondary | ICD-10-CM | POA: Diagnosis not present

## 2022-10-09 DIAGNOSIS — Z1231 Encounter for screening mammogram for malignant neoplasm of breast: Secondary | ICD-10-CM

## 2022-10-09 DIAGNOSIS — Z96651 Presence of right artificial knee joint: Secondary | ICD-10-CM

## 2022-10-09 DIAGNOSIS — E118 Type 2 diabetes mellitus with unspecified complications: Secondary | ICD-10-CM

## 2022-10-09 DIAGNOSIS — Z794 Long term (current) use of insulin: Secondary | ICD-10-CM

## 2022-10-09 MED ORDER — POLYETHYLENE GLYCOL 3350 17 GM/SCOOP PO POWD: 17.0000 g | Freq: Every day | ORAL | Status: AC

## 2022-10-09 NOTE — Patient Instructions (Signed)
Please return in December for your complete physical.   STOP the Eloquis. This is no longer needed. I confirmed this with Dr. Doroteo Glassman notes.   If you have any questions or concerns, please don't hesitate to send me a message via MyChart or call the office at [phone]. Thank you for visiting with [place] today! It's our pleasure caring for you.

## 2022-10-09 NOTE — Telephone Encounter (Signed)
Spoke with Lillia Abed to move forward with verbal orders

## 2022-10-09 NOTE — Telephone Encounter (Signed)
Home Health Verbal Orders  Agency:  [facility]  Caller:  Daine Gip and title  Requesting OT/ PT/ Skilled nursing/ Social Work/ Speech:    Reason for Request:  Stage 2 pressure ulcer on left buttock, need wound orders, bacitracin and Tegaderm, change dressing daily, apply AND ointment on attached skin  Frequency:    HH needs F2F w/in last 30 days     [phone]

## 2022-10-09 NOTE — Progress Notes (Signed)
Subjective  CC:  Chief Complaint  Patient presents with   Hospitalization Follow-up    09/12/2022 - 09/20/2022 (8 days) [facility]    Knee replacement    HPI: Jessica [name] is a 72 y.o. female who presents to the office today for follow up of diabetes and problems listed above in the chief complaint.  S/p knee replacement right. No complications reviewed hospital notes and recent f/u visit. Outpt pt at home. Was discharged from [facility] 09/29/2022.  Post op recovery at SNF: unfortunately had difficult course due to the snf not being able to meet her medical needs. She is doing better now: constipated, celiac flare etc. Feeling stronger now. Diabetes follow up: Her diabetic control is reported as Improved. Recent A1c is improved. Managed by Endocrine. She denies exertional CP or SOB or symptomatic hypoglycemia. She denies foot sores or paresthesias. Need to get eye exam report BP is stable on meds. No cp HLD stable.  Wt Readings from Last 3 Encounters:  09/12/22 210 lb (95.3 kg)  09/05/22 209 lb (94.8 kg)  08/31/22 213 lb (96.6 kg)    BP Readings from Last 3 Encounters:  10/09/22 138/70  09/20/22 (!) 130/53  09/05/22 (!) 143/74    Assessment  1. Status post total right knee replacement   2. Type 2 diabetes mellitus with complication, with long-term current use of insulin (HCC)   3. Screening mammogram for breast cancer   4. Essential hypertension   5. Combined hyperlipidemia associated with type 2 diabetes mellitus (HCC)      Plan  S/p TKR right; doing well, working on rom and strength. Has f/u with ortho in 4 weeks Diabetes is currently well controlled. No med changes HTN is cotnrolled HLD at goal   Follow up: dec for cpe No orders of the defined types were placed in this encounter.  Meds ordered this encounter  Medications   polyethylene glycol powder ([employer] PURELAX) 17 GM/SCOOP powder    Sig: Take 17 g by mouth at bedtime.      Immunization  History  Administered Date(s) Administered   Fluad Quad(high Dose 65+) 01/16/2019, 01/20/2020, 04/12/2022   Influenza,inj,Quad PF,6+ Mos 02/14/2018   Influenza-Unspecified 12/30/2020   PFIZER Comirnaty(Gray Top)Covid-19 Tri-Sucrose Vaccine 09/02/2020   PFIZER(Purple Top)SARS-COV-2 Vaccination 07/03/2019, 08/06/2019   Pneumococcal Conjugate-13 11/03/2015   Pneumococcal Polysaccharide-23 08/17/2010, 02/14/2018   Td 12/31/2002   Tdap 10/25/2017   Zoster Recombinat (Shingrix) 11/05/2018, 01/28/2019    Diabetes Related Lab Review: Lab Results  Component Value Date   HGBA1C 6.3 (H) 08/31/2022   HGBA1C 7.2 (A) 06/14/2022   HGBA1C 6.6 (A) 02/08/2022    Lab Results  Component Value Date   MICROALBUR <0.7 10/19/2021   Lab Results  Component Value Date   CREATININE 0.93 09/13/2022   BUN 15 09/13/2022   NA 136 09/13/2022   K 4.4 09/13/2022   CL 104 09/13/2022   CO2 25 09/13/2022   Lab Results  Component Value Date   CHOL 145 04/12/2022   CHOL 135 04/13/2021   CHOL 131 10/07/2020   Lab Results  Component Value Date   HDL 54.40 04/12/2022   HDL 59.70 04/13/2021   HDL 52.50 10/07/2020   Lab Results  Component Value Date   LDLCALC 72 04/12/2022   LDLCALC 65 04/13/2021   LDLCALC 58 10/07/2020   Lab Results  Component Value Date   TRIG 93.0 04/12/2022   TRIG 50.0 04/13/2021   TRIG 101.0 10/07/2020   Lab Results  Component Value Date   CHOLHDL 3 04/12/2022   CHOLHDL 2 04/13/2021   CHOLHDL 2 10/07/2020   Lab Results  Component Value Date   LDLDIRECT 143.2 11/23/2006   The 10-year ASCVD risk score (Arnett DK, et al., 2019) is: 25%   Values used to calculate the score:     Age: 20 years     Sex: Female     Is Non-Hispanic African American: Yes     Diabetic: Yes     Tobacco smoker: No     Systolic Blood Pressure: 138 mmHg     Is BP treated: Yes     HDL Cholesterol: 54.4 mg/dL     Total Cholesterol: 145 mg/dL I have reviewed the PMH, Fam and Soc  history. Patient Active Problem List   Diagnosis Date Noted   Morbid obesity (HCC) 04/13/2022    Priority: High    BMI 34    Peripheral sensory neuropathy due to type 2 diabetes mellitus (HCC) 04/05/2020    Priority: High   Celiac disease 02/14/2018    Priority: High   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) 02/14/2018    Priority: High   Class 2 obesity due to excess calories with body mass index (BMI) of 37.0 to 37.9 in adult 02/14/2018    Priority: High   Type 2 diabetes mellitus with complication, with long-term current use of insulin (HCC) 09/23/2010    Priority: High   Combined hyperlipidemia associated with type 2 diabetes mellitus (HCC) 01/29/2009    Priority: High   Essential hypertension 10/22/2006    Priority: High    Qualifier: Diagnosis of  By: Tyrone Apple, Lucy      Status post total right knee replacement 09/12/2022    Priority: Medium    Degenerative arthritis of left knee 08/07/2022    Priority: Medium     Toradol injection given in August 07, 2022    Gastroesophageal reflux disease 04/18/2022    Priority: Medium    Family history of malignant neoplasm of endometrium 10/08/2018    Priority: Medium    Osteopenia 02/14/2018    Priority: Medium     dexa 2019    Mild intermittent asthma 02/14/2018    Priority: Medium    Degenerative cervical disc 07/27/2015    Priority: Medium    OSA (obstructive sleep apnea) 02/28/2011    Priority: Medium    Constipation, slow transit 09/23/2010    Priority: Medium    FH: colon cancer 09/23/2010    Priority: Medium    Personal history of colonic polyps 09/23/2010    Priority: Medium    Diabetic gastroparesis (HCC) 02/27/2007    Priority: Medium    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 She has never used smokeless tobacco. She reports that she does not drink alcohol and does not use drugs.  Review of Systems: Ophthalmic: negative for eye pain, loss of vision or double vision Cardiovascular: negative for chest pain Respiratory: negative for SOB or persistent cough Gastrointestinal: negative for abdominal pain Genitourinary:  negative for dysuria or gross hematuria MSK: negative for foot lesions Neurologic: negative for weakness or gait disturbance  Objective  Vitals: BP 138/70   Pulse 97   Temp 97.8 F (36.6 C)   Ht 5\' 4"  (1.626 m)   SpO2 97%   BMI 36.05 kg/m  General: well appearing, no acute distress , in wheel chair Psych:  Alert and oriented, normal mood and affect HEENT:  Normocephalic, atraumatic, moist mucous membranes, supple neck  Cardiovascular:  Nl S1 and S2, RRR without murmur, gallop or rub. no edema Respiratory:  Good breath sounds bilaterally, CTAB with normal effort, no rales Gastrointestinal: normal BS, soft, nontender Right knee: intact surgical scar w/ steri-strips w/o erythema or drainage   Diabetic education: ongoing education regarding chronic disease management for diabetes was given today. We continue to reinforce the ABC's of diabetic management: A1c (<7 or 8 dependent upon patient), tight blood pressure control, and cholesterol management with goal LDL < 100 minimally. We discuss diet strategies, exercise recommendations, medication options and possible side effects. At each visit, we review recommended immunizations and preventive care recommendations for diabetics and stress that good diabetic control can prevent other problems. See below for this patient's data.   Commons side effects, risks, benefits, and alternatives for medications and treatment  plan prescribed today were discussed, and the patient expressed understanding of the given instructions. Patient is instructed to call or message via MyChart if he/she has any questions or concerns regarding our treatment plan. No barriers to understanding were identified. We discussed Red Flag symptoms and signs in detail. Patient expressed understanding regarding what to do in case of urgent or emergency type symptoms.  Medication list was reconciled, printed and provided to the patient in AVS. Patient instructions and summary information was reviewed with the patient as documented in the AVS. This note was prepared with assistance of Dragon voice recognition software. Occasional wrong-word or sound-a-like substitutions may have occurred due to the inherent limitations of voice recognition software

## 2022-10-11 ENCOUNTER — Encounter: Payer: Self-pay | Admitting: Physical Therapy

## 2022-10-11 ENCOUNTER — Other Ambulatory Visit: Payer: Self-pay

## 2022-10-11 ENCOUNTER — Ambulatory Visit (INDEPENDENT_AMBULATORY_CARE_PROVIDER_SITE_OTHER): Payer: Medicare Other | Admitting: Physical Therapy

## 2022-10-11 DIAGNOSIS — R2681 Unsteadiness on feet: Secondary | ICD-10-CM

## 2022-10-11 DIAGNOSIS — M25661 Stiffness of right knee, not elsewhere classified: Secondary | ICD-10-CM | POA: Diagnosis not present

## 2022-10-11 DIAGNOSIS — M25561 Pain in right knee: Secondary | ICD-10-CM | POA: Diagnosis not present

## 2022-10-11 DIAGNOSIS — R2689 Other abnormalities of gait and mobility: Secondary | ICD-10-CM

## 2022-10-11 DIAGNOSIS — R600 Localized edema: Secondary | ICD-10-CM | POA: Diagnosis not present

## 2022-10-11 DIAGNOSIS — M6281 Muscle weakness (generalized): Secondary | ICD-10-CM

## 2022-10-11 NOTE — Therapy (Signed)
OUTPATIENT PHYSICAL THERAPY LOWER EXTREMITY EVALUATION   Patient Name: Jessica [name] MRN: [id number] DOB:January 25, 1951, 72 y.o., female Today's Date: 10/11/2022  END OF SESSION:  PT End of Session - 10/11/22 1630     Visit Number 1    Number of Visits 27    Date for PT Re-Evaluation 12/29/22    Authorization Type [employer]    Progress Note Due on Visit 10    PT Start Time 1345    PT Stop Time 1428    PT Time Calculation (min) 43 min    Equipment Utilized During Treatment Gait belt    Activity Tolerance Patient tolerated treatment well;Patient limited by fatigue;Patient limited by pain    Behavior During Therapy WFL for tasks assessed/performed             Past Medical History:  Diagnosis Date   Anxiety    Arthritis    bilateral knees   Asthma    childhood, inhaler for wheezing PRN   Autonomic neuropathy    diabetic   Cataract    Celiac disease    Constipation    Depression    Diabetes mellitus    type II, Hemoglobin A1C 9.9 10/05/2011   Diabetic neuropathy (HCC)    Diverticulosis    Fatty liver    Gastroparesis    GERD (gastroesophageal reflux disease)    History of claustrophobia    with MRI tests   Hyperlipidemia    Hypertension    IBS (irritable bowel syndrome)    Kidney stones    Personal history of colonic polyps 03/2010   hyperplastic   PONV (postoperative nausea and vomiting)    Shingles 2021   Past Surgical History:  Procedure Laterality Date   APPENDECTOMY     CATARACT EXTRACTION Right 04/29/2018   CATARACT EXTRACTION Left 03/2018   COLONOSCOPY     DILATATION & CURRETTAGE/HYSTEROSCOPY WITH RESECTOCOPE N/A 03/24/2013   Procedure: DILATATION & CURETTAGE, HYSTEROSCOPY WITH RESECTION;  Surgeon: Levi Aland, MD;  Location: [facility];  Service: Gynecology;  Laterality: N/A;   HYSTEROSCOPY WITH D & C N/A 11/08/2015   Procedure: DILATATION AND CURETTAGE /HYSTEROSCOPY;  Surgeon: Levi Aland, MD;  Location: [facility];  Service: Gynecology;   Laterality: N/A;   left breast cyst removal  2000   LEFT HEART CATH AND CORONARY ANGIOGRAPHY N/A 10/31/2018   Procedure: LEFT HEART CATH AND CORONARY ANGIOGRAPHY;  Surgeon: Corky Crafts, MD;  Location: [facility] INVASIVE CV LAB;  Service: Cardiovascular;  Laterality: N/A;   TOTAL KNEE ARTHROPLASTY Right 09/12/2022   Procedure: RIGHT TOTAL KNEE ARTHROPLASTY;  Surgeon: Kathryne Hitch, MD;  Location: [facility] OR;  Service: Orthopedics;  Laterality: Right;   TUBAL LIGATION     Patient Active Problem List   Diagnosis Date Noted   Status post total right knee replacement 09/12/2022   Degenerative arthritis of left knee 08/07/2022   Chronic rhinitis 04/18/2022   Gastroesophageal reflux disease 04/18/2022   Vitamin D deficiency 04/13/2022   Morbid obesity (HCC) 04/13/2022   Peripheral sensory neuropathy due to type 2 diabetes mellitus (HCC) 04/05/2020   Bilateral lower extremity edema 01/20/2020   Family history of malignant neoplasm of endometrium 10/08/2018   Celiac disease 02/14/2018   Osteopenia 02/14/2018   Mild intermittent asthma 02/14/2018   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) 02/14/2018   Class 2 obesity due to excess calories with body mass index (BMI) of 37.0 to 37.9 in adult 02/14/2018  Fibroma of tongue 06/06/2016   Renal calculi 01/13/2016   Degenerative cervical disc 07/27/2015   OSA (obstructive sleep apnea) 02/28/2011   Constipation, slow transit 09/23/2010   Type 2 diabetes mellitus with complication, with long-term current use of insulin (HCC) 09/23/2010   FH: colon cancer 09/23/2010   Personal history of colonic polyps 09/23/2010   Combined hyperlipidemia associated with type 2 diabetes mellitus (HCC) 01/29/2009   Diabetic gastroparesis (HCC) 02/27/2007   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 Mardelle Matte, MD   REFERRING PROVIDER:  Vanita Panda. Magnus Ivan, MD  REFERRING DIAG: [phone] (ICD-10-CM) - Status post total right knee replacement   THERAPY DIAG:  Stiffness of right knee, not elsewhere classified  Acute pain of right knee  Muscle weakness (generalized)  Localized edema  Other abnormalities of gait and mobility  Unsteadiness on feet  Rationale for Evaluation and Treatment: Rehabilitation  ONSET DATE: 09/12/2022  SUBJECTIVE:   SUBJECTIVE STATEMENT: This 72yo female underwent a right TKA on 09/12/2022 due to functionally disability.  She was discharged to SNF and discharged from [facility] 09/29/2022. She had trouble with her Celiac disease and got pressure sore on buttocks.  She was limited in PT by weakness with Celiac flare up.  Then HHPT thru 10/09/2022.  Her Celiac is under control once she is home.    PERTINENT HISTORY: Right TKA 09/12/2022, DM2, asthma, OA, peripheral neuropathy, osteopenia, shingles  PAIN:  NPRS scale: today 5/10 and over last week ranging 0/10 - 8/10 Pain location: right knee around joint line and in popliteal area.  Pain description: throbbing Aggravating factors: sit in car, positioning like bed Relieving factors: Tylenol, ice  PRECAUTIONS: Fall  WEIGHT BEARING RESTRICTIONS: No  FALLS:  Has patient fallen in last 6 months? No  LIVING ENVIRONMENT: Lives with: lives with their spouse Lives in: [place] with master handicap accessible bath & bedroom ramped entrance. Does not go upstairs.  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   PATIENT SURVEYS:  FOTO intake:  21%  predicted:   47%  COGNITION: Overall cognitive status: WFL    SENSATION: WFL  EDEMA:  Circumferential:  LLE: above knee 51.5cm,  around knee 50.2 cm, below knee 42.5 cm RLE: above knee 55.5 cm,  around knee 53.8 cm, below knee 46.2 cm  POSTURE: rounded shoulders, forward head, flexed trunk , and weight shift left  PALPATION: Right knee tenderness along joint line & incision.   LOWER EXTREMITY ROM:   ROM Right eval Left eval  Hip flexion    Hip extension    Hip abduction    Hip adduction    Hip internal rotation    Hip external rotation    Knee flexion Seated A: 64* P: 74*   Knee extension Seated A: LAQ -22* Seated with LE extended P: -11*   Ankle dorsiflexion    Ankle plantarflexion    Ankle inversion    Ankle eversion     (Blank rows = not tested)  LOWER EXTREMITY MMT:  MMT Right eval  Hip flexion  Hip extension   Hip abduction   Hip adduction   Hip internal rotation   Hip external rotation   Knee flexion 3-/5  Knee extension 3-/5  Ankle dorsiflexion   Ankle plantarflexion   Ankle inversion   Ankle eversion    (Blank rows = not tested)  FUNCTIONAL TESTS:  18 inch chair transfer: requires BUE support to arise from 22" w/c to RW for stabilization TUG with RW: 41.75 sec  GAIT: Distance walked: 20' Assistive device utilized: Environmental consultant - 2 wheeled Level of assistance: SBA Comments: Antalgic gait pattern with decreased stance duration right lower extremity, right knee flexed in stance and minimal increase flexion for swing phase and flexed trunk.   TODAY'S TREATMENT                                                                          DATE: 10/11/2022: Therex:    HEP instruction/performance c cues for techniques, handout provided.  Trial set performed of each for comprehension and symptom assessment.  See below for exercise list PT recommended exercising 1 exercise per hour throughout her awake hours and working her way through the entire HEP.  The point of  this to minimize knee stiffening up during the day. PT recommended elevating lower extremities higher than heart >/= 15 min >/= 2x/day.  PATIENT EDUCATION:  Education details: HEP, POC Person educated: Patient Education method: Programmer, multimedia, Demonstration, Verbal cues, and Handouts Education comprehension: verbalized understanding, returned demonstration, and verbal cues required  HOME EXERCISE PROGRAM: Access Code: [email or web address] URL: [email or web address] Date: 10/11/2022 Prepared by: Vladimir Faster  Exercises - Ankle Alphabet in Elevation  - 2-4 x daily - 7 x weekly - 1 sets - 1 reps - supine quad set with towel roll under ankle  - 2-4 x daily - 7 x weekly - 2 sets - 10 reps - 5 seconds hold - Supine Heel Slide with Strap  - 2-3 x daily - 7 x weekly - 2-3 sets - 10 reps - 5 seconds hold - Seated Knee Flexion Extension AROM   - 2-4 x daily - 7 x weekly - 2-3 sets - 10 reps - 5 seconds hold - Supine Knee Extension Strengthening  - 2-4 x daily - 7 x weekly - 2-3 sets - 10 reps - 5 seconds hold - Seated Long Arc Quad  - 2-4 x daily - 7 x weekly - 2-3 sets - 10 reps - 5 seconds hold  ASSESSMENT: CLINICAL IMPRESSION: Patient is a 72 y.o. who comes to clinic with complaints of right knee pain with mobility, strength and movement coordination deficits that impair their ability to perform usual daily and recreational functional activities without increase difficulty/symptoms at this time.  Patient to benefit from skilled PT services to address impairments and limitations to improve to previous level of function without restriction secondary to condition.   OBJECTIVE IMPAIRMENTS: Abnormal gait, decreased activity tolerance, decreased balance, decreased endurance, decreased knowledge of condition, decreased knowledge of use of DME, decreased mobility, difficulty walking, decreased ROM, decreased strength, increased edema, postural dysfunction, obesity, and pain.   ACTIVITY LIMITATIONS:  carrying, lifting, bending, sitting, standing, squatting, sleeping, stairs, transfers, bed mobility, and locomotion level  PARTICIPATION LIMITATIONS: meal prep, cleaning, driving, community  activity, and church  PERSONAL FACTORS: Fitness, Past/current experiences, Time since onset of injury/illness/exacerbation, and 3+ comorbidities: see PMH  are also affecting patient's functional outcome.   REHAB POTENTIAL: Good  CLINICAL DECISION MAKING: Stable/uncomplicated  EVALUATION COMPLEXITY: Low   GOALS: Goals reviewed with patient? Yes  SHORT TERM GOALS: (target date for Short term goals 7/05/02/2022)   1.  Patient will demonstrate independent use of home exercise program to maintain progress from in clinic treatments.  Goal status: New  2. PROM right knee ext -5* and flexion 90* Goal status: New  3. Interim FOTO >32%  Goal status: New  LONG TERM GOALS: (target dates for all long term goals  12/29/2022 )   1. Patient will demonstrate/report pain at worst less than or equal to 2/10 to facilitate minimal limitation in daily activity secondary to pain symptoms.  Goal status: New   2. Patient will demonstrate independent use of home exercise program to facilitate ability to maintain/progress functional gains from skilled physical therapy services.  Goal status: New   3. Patient will demonstrate FOTO outcome > or = 47 % to indicate reduced disability due to condition.  Goal status: New   4.  Patient will demonstrate right knee MMT 4/5 throughout to faciltiate usual transfers, stairs, squatting at [facility] for daily life.   Goal status: New   5.  Patient right knee AROM ext standing -2* and flexion seated or supine 100* Goal status: New   6.  patient amb with LRAD >500' and negotiates ramps/curbs/stairs modified independent.  Goal status: New   PLAN:  PT FREQUENCY:  2-3x/wk  PT DURATION: 12 weeks  PLANNED INTERVENTIONS: Therapeutic exercises, Therapeutic activity, Neuro  Muscular re-education, Balance training, Gait training, Patient/Family education, Joint mobilization, Stair training, DME instructions, Dry Needling, Electrical stimulation, Traction, Cryotherapy, vasopneumatic deviceMoist heat, Taping, Ultrasound, Ionotophoresis 4mg /ml Dexamethasone, and aquatic therapy, Manual therapy.  All included unless contraindicated  PLAN FOR NEXT SESSION: Review & update HEP, manual therapy & exercise for range, vaso to end for edema   Vladimir Faster, PT, DPT 10/11/2022, 4:37 PM

## 2022-10-12 ENCOUNTER — Ambulatory Visit: Payer: Medicare Other | Admitting: Family Medicine

## 2022-10-12 ENCOUNTER — Other Ambulatory Visit: Payer: Self-pay | Admitting: Orthopaedic Surgery

## 2022-10-12 ENCOUNTER — Encounter: Payer: Self-pay | Admitting: Orthopaedic Surgery

## 2022-10-12 MED ORDER — ACETAMINOPHEN-CODEINE 300-30 MG PO TABS: 1.0000 | ORAL_TABLET | Freq: Two times a day (BID) | ORAL | 0 refills | Status: DC | PRN

## 2022-10-13 ENCOUNTER — Encounter: Payer: Self-pay | Admitting: Rehabilitative and Restorative Service Providers"

## 2022-10-13 ENCOUNTER — Ambulatory Visit (INDEPENDENT_AMBULATORY_CARE_PROVIDER_SITE_OTHER): Payer: Medicare Other | Admitting: Rehabilitative and Restorative Service Providers"

## 2022-10-13 ENCOUNTER — Encounter: Payer: Medicare Other | Admitting: Rehabilitative and Restorative Service Providers"

## 2022-10-13 DIAGNOSIS — R600 Localized edema: Secondary | ICD-10-CM | POA: Diagnosis not present

## 2022-10-13 DIAGNOSIS — M25661 Stiffness of right knee, not elsewhere classified: Secondary | ICD-10-CM | POA: Diagnosis not present

## 2022-10-13 DIAGNOSIS — M25561 Pain in right knee: Secondary | ICD-10-CM

## 2022-10-13 DIAGNOSIS — R2689 Other abnormalities of gait and mobility: Secondary | ICD-10-CM

## 2022-10-13 DIAGNOSIS — M6281 Muscle weakness (generalized): Secondary | ICD-10-CM | POA: Diagnosis not present

## 2022-10-13 DIAGNOSIS — R2681 Unsteadiness on feet: Secondary | ICD-10-CM

## 2022-10-13 NOTE — Therapy (Signed)
OUTPATIENT PHYSICAL THERAPY LOWER EXTREMITY TREATMENT   Patient Name: Jessica [name] MRN: [id number] DOB:27-Feb-1951, 72 y.o., female Today's Date: 10/13/2022  END OF SESSION:  PT End of Session - 10/13/22 1429     Visit Number 2    Number of Visits 27    Date for PT Re-Evaluation 12/29/22    Authorization Type [employer] & [facility]    Authorization - Visit Number 2    Progress Note Due on Visit 10    PT Start Time 1345    PT Stop Time 1435    PT Time Calculation (min) 50 min    Equipment Utilized During Treatment Gait belt    Activity Tolerance Patient tolerated treatment well;Patient limited by pain;No increased pain    Behavior During Therapy [facility] for tasks assessed/performed              Past Medical History:  Diagnosis Date   Anxiety    Arthritis    bilateral knees   Asthma    childhood, inhaler for wheezing PRN   Autonomic neuropathy    diabetic   Cataract    Celiac disease    Constipation    Depression    Diabetes mellitus    type II, Hemoglobin A1C 9.9 10/05/2011   Diabetic neuropathy (HCC)    Diverticulosis    Fatty liver    Gastroparesis    GERD (gastroesophageal reflux disease)    History of claustrophobia    with MRI tests   Hyperlipidemia    Hypertension    IBS (irritable bowel syndrome)    Kidney stones    Personal history of colonic polyps 03/2010   hyperplastic   PONV (postoperative nausea and vomiting)    Shingles 2021   Past Surgical History:  Procedure Laterality Date   APPENDECTOMY     CATARACT EXTRACTION Right 04/29/2018   CATARACT EXTRACTION Left 03/2018   COLONOSCOPY     DILATATION & CURRETTAGE/HYSTEROSCOPY WITH RESECTOCOPE N/A 03/24/2013   Procedure: DILATATION & CURETTAGE, HYSTEROSCOPY WITH RESECTION;  Surgeon: Levi Aland, MD;  Location: [facility];  Service: Gynecology;  Laterality: N/A;   HYSTEROSCOPY WITH D & C N/A 11/08/2015   Procedure: DILATATION AND CURETTAGE /HYSTEROSCOPY;  Surgeon: Levi Aland, MD;  Location: [facility];   Service: Gynecology;  Laterality: N/A;   left breast cyst removal  2000   LEFT HEART CATH AND CORONARY ANGIOGRAPHY N/A 10/31/2018   Procedure: LEFT HEART CATH AND CORONARY ANGIOGRAPHY;  Surgeon: Corky Crafts, MD;  Location: [facility] INVASIVE CV LAB;  Service: Cardiovascular;  Laterality: N/A;   TOTAL KNEE ARTHROPLASTY Right 09/12/2022   Procedure: RIGHT TOTAL KNEE ARTHROPLASTY;  Surgeon: Kathryne Hitch, MD;  Location: [facility];  Service: Orthopedics;  Laterality: Right;   TUBAL LIGATION     Patient Active Problem List   Diagnosis Date Noted   Status post total right knee replacement 09/12/2022   Degenerative arthritis of left knee 08/07/2022   Chronic rhinitis 04/18/2022   Gastroesophageal reflux disease 04/18/2022   Vitamin D deficiency 04/13/2022   Morbid obesity (HCC) 04/13/2022   Peripheral sensory neuropathy due to type 2 diabetes mellitus (HCC) 04/05/2020   Bilateral lower extremity edema 01/20/2020   Family history of malignant neoplasm of endometrium 10/08/2018   Celiac disease 02/14/2018   Osteopenia 02/14/2018   Mild intermittent asthma 02/14/2018   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) 02/14/2018   Class 2 obesity due to excess calories with body mass index (BMI) of  37.0 to 37.9 in adult 02/14/2018   Fibroma of tongue 06/06/2016   Renal calculi 01/13/2016   Degenerative cervical disc 07/27/2015   OSA (obstructive sleep apnea) 02/28/2011   Constipation, slow transit 09/23/2010   Type 2 diabetes mellitus with complication, with long-term current use of insulin (HCC) 09/23/2010   FH: colon cancer 09/23/2010   Personal history of colonic polyps 09/23/2010   Combined hyperlipidemia associated with type 2 diabetes mellitus (HCC) 01/29/2009   Diabetic gastroparesis (HCC) 02/27/2007   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 Mardelle Matte, MD    REFERRING PROVIDER: Vanita Panda. Magnus Ivan, MD  REFERRING DIAG: [phone] (ICD-10-CM) - Status post total right knee replacement   THERAPY DIAG:  Stiffness of right knee, not elsewhere classified  Acute pain of right knee  Muscle weakness (generalized)  Localized edema  Other abnormalities of gait and mobility  Unsteadiness on feet  Rationale for Evaluation and Treatment: Rehabilitation  ONSET DATE: 09/12/2022  SUBJECTIVE:   SUBJECTIVE STATEMENT: Velora reports good HEP compliance since evaluation Wednesday.  PERTINENT HISTORY: Right TKA 09/12/2022, DM2, asthma, OA, peripheral neuropathy, osteopenia, shingles  PAIN:  NPRS scale: 0-8/10 this week Pain location: right knee around joint line and in popliteal area.  Pain description: throbbing Aggravating factors: sit in car, positioning like bed Relieving factors: Tylenol, ice  PRECAUTIONS: Fall  WEIGHT BEARING RESTRICTIONS: No  FALLS:  Has patient fallen in last 6 months? No  LIVING ENVIRONMENT: Lives with: lives with their spouse Lives in: [place] with master handicap accessible bath & bedroom ramped entrance. Does not go upstairs.  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   PATIENT SURVEYS:  FOTO intake:  21%  predicted:  47%  COGNITION: Overall cognitive status: WFL    SENSATION: WFL  EDEMA:  Circumferential:  LLE: above knee 51.5cm,  around knee 50.2 cm, below knee 42.5 cm RLE: above knee 55.5 cm,  around knee 53.8 cm, below knee 46.2 cm  POSTURE: rounded shoulders, forward head, flexed trunk , and  weight shift left  PALPATION: Right knee tenderness along joint line & incision.   LOWER EXTREMITY ROM:   ROM Right eval Right 10/13/2022  Hip flexion    Hip extension    Hip abduction    Hip adduction    Hip internal rotation    Hip external rotation    Knee flexion Seated A: 64* P: 74* Active 75  Knee extension Seated A: LAQ -22* Seated with LE extended P: -11* Active -7  Ankle dorsiflexion    Ankle plantarflexion    Ankle inversion    Ankle eversion     (Blank rows = not tested)  LOWER EXTREMITY MMT:  MMT Right eval  Hip flexion   Hip extension   Hip abduction   Hip adduction   Hip internal rotation   Hip external rotation   Knee flexion 3-/5  Knee extension 3-/5  Ankle dorsiflexion   Ankle plantarflexion   Ankle inversion   Ankle eversion    (Blank rows = not tested)  FUNCTIONAL TESTS:  18 inch chair transfer: requires BUE support to arise from 22" w/c to RW for stabilization TUG with RW: 41.75 sec  GAIT: Distance walked: 20' Assistive device utilized: Environmental consultant - 2 wheeled Level of assistance: SBA Comments: Antalgic gait pattern with decreased stance duration right lower extremity, right knee flexed in stance and minimal increase flexion for swing phase and flexed trunk.   TODAY'S TREATMENT                                                                          DATE: 10/13/2022 Seated knee flexion/extension AROM 10 x 5 seconds Seated long arc quad 10 x 5 seconds [place]gate knee flexion 1 minute Seated AAROM knee flexion 10 x 10 seconds (left pushes right into flexion) Discussed SAQ  Quadriceps sets 2 sets of 10 for 5 seconds with heel prop Supine knee flexion with belt and PT assist 10 x 10 seconds  Vaso right knee 10 minutes Medium 34*    10/11/2022: Therex: HEP instruction/performance c cues for techniques, handout provided.  Trial set performed of each for comprehension and symptom assessment.  See below for exercise list PT recommended  exercising 1 exercise per hour throughout her awake hours and working her way through the entire HEP.  The point of this to minimize knee stiffening up during the day. PT recommended elevating lower extremities higher than heart >/= 15 min >/= 2x/day.   PATIENT EDUCATION:  Education details: HEP, POC Person educated: Patient Education method: Programmer, multimedia, Demonstration, Verbal cues, and Handouts Education comprehension: verbalized understanding, returned demonstration, and verbal cues required  HOME EXERCISE PROGRAM: Access Code: [email or web address] URL: [email or web address] Date: 10/11/2022 Prepared by: Vladimir Faster  Exercises - Ankle Alphabet in Elevation  - 2-4 x daily - 7 x weekly - 1 sets - 1 reps - supine quad set with towel roll under ankle  - 2-4 x daily - 7 x weekly - 2 sets - 10 reps - 5 seconds hold - Supine Heel Slide with Strap  - 2-3 x daily - 7 x weekly - 2-3 sets - 10 reps - 5 seconds hold - Seated Knee Flexion Extension AROM   - 2-4 x daily - 7 x weekly - 2-3 sets - 10 reps - 5 seconds hold - Supine Knee Extension Strengthening  - 2-4 x daily - 7 x weekly - 2-3 sets - 10 reps - 5 seconds hold - Seated Long Arc Quad  - 2-4 x daily - 7 x weekly - 2-3 sets - 10 reps - 5 seconds hold  ASSESSMENT: CLINICAL IMPRESSION: Brisha gave great effort with her supervised PT today and she reports good compliance with her HEP.  AROM was measured at 7 - 0 - 75 degrees (was 11 - 0 - 64 degrees 2 days ago).  Emphasis remains AROM, quadriceps strength and edema control.  No changes were made to her HEP and continued compliance was recommended.  OBJECTIVE IMPAIRMENTS: Abnormal gait, decreased activity tolerance, decreased balance, decreased endurance, decreased knowledge of condition, decreased knowledge of use of DME, decreased mobility, difficulty walking, decreased ROM, decreased strength, increased edema, postural dysfunction, obesity, and pain.   ACTIVITY LIMITATIONS: carrying,  lifting, bending, sitting, standing, squatting, sleeping, stairs, transfers, bed  mobility, and locomotion level  PARTICIPATION LIMITATIONS: meal prep, cleaning, driving, community activity, and church  PERSONAL FACTORS: Fitness, Past/current experiences, Time since onset of injury/illness/exacerbation, and 3+ comorbidities: see PMH  are also affecting patient's functional outcome.   REHAB POTENTIAL: Good  CLINICAL DECISION MAKING: Stable/uncomplicated  EVALUATION COMPLEXITY: Low   GOALS: Goals reviewed with patient? Yes  SHORT TERM GOALS: (target date for Short term goals 7/05/02/2022)   1.  Patient will demonstrate independent use of home exercise program to maintain progress from in clinic treatments.  Goal status: On Going 10/13/2022  2. PROM right knee ext -5* and flexion 90* Goal status: On Going 10/13/2022  3. Interim FOTO >32%  Goal status: New  LONG TERM GOALS: (target dates for all long term goals  12/29/2022 )   1. Patient will demonstrate/report pain at worst less than or equal to 2/10 to facilitate minimal limitation in daily activity secondary to pain symptoms.  Goal status: New   2. Patient will demonstrate independent use of home exercise program to facilitate ability to maintain/progress functional gains from skilled physical therapy services.  Goal status: New   3. Patient will demonstrate FOTO outcome > or = 47 % to indicate reduced disability due to condition.  Goal status: New   4.  Patient will demonstrate right knee MMT 4/5 throughout to faciltiate usual transfers, stairs, squatting at [facility] for daily life.   Goal status: New   5.  Patient right knee AROM ext standing -2* and flexion seated or supine 100* Goal status: New   6.  patient amb with LRAD >500' and negotiates ramps/curbs/stairs modified independent.  Goal status: New   PLAN:  PT FREQUENCY:  2-3x/wk  PT DURATION: 12 weeks  PLANNED INTERVENTIONS: Therapeutic exercises, Therapeutic  activity, Neuro Muscular re-education, Balance training, Gait training, Patient/Family education, Joint mobilization, Stair training, DME instructions, Dry Needling, Electrical stimulation, Traction, Cryotherapy, vasopneumatic deviceMoist heat, Taping, Ultrasound, Ionotophoresis 4mg /ml Dexamethasone, and aquatic therapy, Manual therapy.  All included unless contraindicated  PLAN FOR NEXT SESSION: Review & update HEP, manual therapy & exercise for range, vaso to end for edema.   Cherlyn Cushing, PT, MPT 10/13/2022, 4:18 PM

## 2022-10-16 ENCOUNTER — Encounter: Payer: Self-pay | Admitting: Physical Therapy

## 2022-10-16 ENCOUNTER — Ambulatory Visit (INDEPENDENT_AMBULATORY_CARE_PROVIDER_SITE_OTHER): Payer: Medicare Other | Admitting: Physical Therapy

## 2022-10-16 DIAGNOSIS — M6281 Muscle weakness (generalized): Secondary | ICD-10-CM | POA: Diagnosis not present

## 2022-10-16 DIAGNOSIS — M25561 Pain in right knee: Secondary | ICD-10-CM | POA: Diagnosis not present

## 2022-10-16 DIAGNOSIS — R600 Localized edema: Secondary | ICD-10-CM

## 2022-10-16 DIAGNOSIS — M25661 Stiffness of right knee, not elsewhere classified: Secondary | ICD-10-CM

## 2022-10-16 DIAGNOSIS — R2689 Other abnormalities of gait and mobility: Secondary | ICD-10-CM

## 2022-10-16 DIAGNOSIS — R2681 Unsteadiness on feet: Secondary | ICD-10-CM

## 2022-10-16 NOTE — Therapy (Signed)
OUTPATIENT PHYSICAL THERAPY LOWER EXTREMITY TREATMENT   Patient Name: Jessica [name] MRN: [id number] DOB:[birth year], 72 y.o., female Today's Date: 10/16/2022  END OF SESSION:  PT End of Session - 10/16/22 1520     Visit Number 3    Number of Visits 27    Date for PT Re-Evaluation 12/29/22    Authorization Type [employer] [facility]    Authorization - Visit Number 3    Progress Note Due on Visit 10    PT Start Time 1521    PT Stop Time 1610    PT Time Calculation (min) 49 min    Equipment Utilized During Treatment Gait belt    Activity Tolerance Patient tolerated treatment well;Patient limited by pain;No increased pain    Behavior During Therapy [facility] for tasks assessed/performed              Past Medical History:  Diagnosis Date   Anxiety    Arthritis    bilateral knees   Asthma    childhood, inhaler for wheezing PRN   Autonomic neuropathy    diabetic   Cataract    Celiac disease    Constipation    Depression    Diabetes mellitus    type II, Hemoglobin A1C 9.9 10/05/2011   Diabetic neuropathy (HCC)    Diverticulosis    Fatty liver    Gastroparesis    GERD (gastroesophageal reflux disease)    History of claustrophobia    with MRI tests   Hyperlipidemia    Hypertension    IBS (irritable bowel syndrome)    Kidney stones    Personal history of colonic polyps 03/2010   hyperplastic   PONV (postoperative nausea and vomiting)    Shingles 2021   Past Surgical History:  Procedure Laterality Date   APPENDECTOMY     CATARACT EXTRACTION Right 04/29/2018   CATARACT EXTRACTION Left 03/2018   COLONOSCOPY     DILATATION & CURRETTAGE/HYSTEROSCOPY WITH RESECTOCOPE N/A 03/24/2013   Procedure: DILATATION & CURETTAGE, HYSTEROSCOPY WITH RESECTION;  Surgeon: Levi Aland, MD;  Location: [facility];  Service: Gynecology;  Laterality: N/A;   HYSTEROSCOPY WITH D & C N/A 11/08/2015   Procedure: DILATATION AND CURETTAGE /HYSTEROSCOPY;  Surgeon: Levi Aland, MD;  Location: [facility];   Service: Gynecology;  Laterality: N/A;   left breast cyst removal  2000   LEFT HEART CATH AND CORONARY ANGIOGRAPHY N/A 10/31/2018   Procedure: LEFT HEART CATH AND CORONARY ANGIOGRAPHY;  Surgeon: Corky Crafts, MD;  Location: [facility] INVASIVE CV LAB;  Service: Cardiovascular;  Laterality: N/A;   TOTAL KNEE ARTHROPLASTY Right 09/12/2022   Procedure: RIGHT TOTAL KNEE ARTHROPLASTY;  Surgeon: Kathryne Hitch, MD;  Location: [facility] OR;  Service: Orthopedics;  Laterality: Right;   TUBAL LIGATION     Patient Active Problem List   Diagnosis Date Noted   Status post total right knee replacement 09/12/2022   Degenerative arthritis of left knee 08/07/2022   Chronic rhinitis 04/18/2022   Gastroesophageal reflux disease 04/18/2022   Vitamin D deficiency 04/13/2022   Morbid obesity (HCC) 04/13/2022   Peripheral sensory neuropathy due to type 2 diabetes mellitus (HCC) 04/05/2020   Bilateral lower extremity edema 01/20/2020   Family history of malignant neoplasm of endometrium 10/08/2018   Celiac disease 02/14/2018   Osteopenia 02/14/2018   Mild intermittent asthma 02/14/2018   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) 02/14/2018   Class 2 obesity due to excess calories with body mass index (BMI) of  37.0 to 37.9 in adult 02/14/2018   Fibroma of tongue 06/06/2016   Renal calculi 01/13/2016   Degenerative cervical disc 07/27/2015   OSA (obstructive sleep apnea) 02/28/2011   Constipation, slow transit 09/23/2010   Type 2 diabetes mellitus with complication, with long-term current use of insulin (HCC) 09/23/2010   FH: colon cancer 09/23/2010   Personal history of colonic polyps 09/23/2010   Combined hyperlipidemia associated with type 2 diabetes mellitus (HCC) 01/29/2009   Diabetic gastroparesis (HCC) 02/27/2007   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 Mardelle Matte, MD    REFERRING PROVIDER: Vanita Panda. Magnus Ivan, MD  REFERRING DIAG: [phone] (ICD-10-CM) - Status post total right knee replacement   THERAPY DIAG:  Stiffness of right knee, not elsewhere classified  Acute pain of right knee  Muscle weakness (generalized)  Localized edema  Other abnormalities of gait and mobility  Unsteadiness on feet  Rationale for Evaluation and Treatment: Rehabilitation  ONSET DATE: 09/12/2022  SUBJECTIVE:   SUBJECTIVE STATEMENT: The last PT session really made her leg hurt.   PERTINENT HISTORY: Right TKA 09/12/2022, DM2, asthma, OA, peripheral neuropathy, osteopenia, shingles  PAIN:  NPRS scale: today 2/10 and last week 2-10/10 this week Pain location: right knee around joint line and in popliteal area.  Pain description: throbbing Aggravating factors: sit in car, positioning like bed Relieving factors: Tylenol, ice  PRECAUTIONS: Fall  WEIGHT BEARING RESTRICTIONS: No  FALLS:  Has patient fallen in last 6 months? No  LIVING ENVIRONMENT: Lives with: lives with their spouse Lives in: [place] with master handicap accessible bath & bedroom ramped entrance. Does not go upstairs.  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   PATIENT SURVEYS:  FOTO intake:  21%  predicted:  47%  COGNITION: Overall cognitive status: WFL    SENSATION: WFL  EDEMA:  Circumferential:  LLE: above knee 51.5cm,  around knee 50.2 cm, below knee 42.5 cm RLE: above knee 55.5 cm,  around knee 53.8 cm, below knee 46.2 cm  POSTURE: rounded shoulders, forward head, flexed trunk  , and weight shift left  PALPATION: Right knee tenderness along joint line & incision.   LOWER EXTREMITY ROM:   ROM Right eval Right 10/13/22 Right 10/16/22  Hip flexion     Hip extension     Hip abduction     Hip adduction     Hip internal rotation     Hip external rotation     Knee flexion Seated A: 64* P: 74* Active 75 AA 84*  Knee extension Seated A: LAQ -22* Seated with LE extended P: -11* Active -7 Supine A: -5*  Ankle dorsiflexion     Ankle plantarflexion     Ankle inversion     Ankle eversion      (Blank rows = not tested)  LOWER EXTREMITY MMT:  MMT Right eval  Hip flexion   Hip extension   Hip abduction   Hip adduction   Hip internal rotation   Hip external rotation   Knee flexion 3-/5  Knee extension 3-/5  Ankle dorsiflexion   Ankle plantarflexion   Ankle inversion   Ankle eversion    (Blank rows = not tested)  FUNCTIONAL TESTS:  18 inch chair transfer: requires BUE support to arise from 22" w/c to RW for stabilization TUG with RW: 41.75 sec  GAIT: Distance walked: 20' Assistive device utilized: Environmental consultant - 2 wheeled Level of assistance: SBA Comments: Antalgic gait pattern with decreased stance duration right lower extremity, right knee flexed in stance and minimal increase flexion for swing phase and flexed trunk.   TODAY'S TREATMENT                                                                          DATE: 10/16/2022: Therapeutic Exercise: Nustep with BLEs & BUEs level 5 seat 8 for 8 min Sit to / from stand (PT demo & verbal cues on technique) pushing up with BUEs 10 reps - first 5 touching RW for stabilization and 2nd 5 reps without touching RW Seated LAQ & active knee flexion with contralateral LE opposing motion 10 reps 2 sets Seated hamstring stretch seated with strap DF 30 sec hold 3 reps Heel slides with lower leg on ball & strap assistance 10 reps.   Manual therapy Contract-relax seated knee flexion. Contraction of quads worked much  better than contraction of hamstring.  PROM with gentle overpressure  Vaso supine elevation with towel roll under heel for knee ext 34* medium compression 10 min    10/13/2022 Seated knee flexion/extension AROM 10 x 5 seconds Seated long arc quad 10 x 5 seconds T[place]gate knee flexion 1 minute Seated AAROM knee flexion 10 x 10 seconds (left pushes right into flexion) Discussed SAQ  Quadriceps sets 2 sets of 10 for 5 seconds with heel prop Supine knee flexion with belt and PT assist 10 x 10 seconds  Vaso right knee 10 minutes Medium 34*    10/11/2022: Therex: HEP instruction/performance c cues for techniques, handout provided.  Trial set performed of each for comprehension and symptom assessment.  See below for exercise list PT recommended exercising 1 exercise per hour throughout her awake hours and working her way through the entire HEP.  The point of this to minimize knee stiffening up during the day. PT recommended elevating lower extremities higher than heart >/= 15 min >/= 2x/day.   PATIENT EDUCATION:  Education details: HEP, POC Person educated: Patient Education method: Programmer, multimedia, Demonstration, Verbal cues, and Handouts Education comprehension: verbalized understanding, returned demonstration, and verbal cues required  HOME EXERCISE PROGRAM: Access Code: [email or web address] URL: [email or web address] Date: 10/11/2022 Prepared by: Vladimir Faster  Exercises - Ankle Alphabet in Elevation  - 2-4 x daily - 7 x weekly - 1 sets - 1 reps - supine quad set with towel roll under ankle  - 2-4 x daily - 7 x weekly - 2 sets - 10 reps - 5 seconds hold - Supine Heel Slide with Strap  - 2-3 x daily - 7 x weekly - 2-3 sets - 10 reps - 5 seconds hold - Seated Knee Flexion Extension AROM   - 2-4 x daily - 7 x weekly - 2-3 sets - 10 reps - 5 seconds hold - Supine Knee Extension Strengthening  - 2-4 x daily - 7  x weekly - 2-3 sets - 10 reps - 5 seconds hold - Seated Long Arc Quad  -  2-4 x daily - 7 x weekly - 2-3 sets - 10 reps - 5 seconds hold  ASSESSMENT: CLINICAL IMPRESSION: Patient tolerated today's session well reporting more tolerable.  Pt had increased range by end of session. She improved sit to/from stand using BLEs.  Pt continues to benefit from skilled PT.   OBJECTIVE IMPAIRMENTS: Abnormal gait, decreased activity tolerance, decreased balance, decreased endurance, decreased knowledge of condition, decreased knowledge of use of DME, decreased mobility, difficulty walking, decreased ROM, decreased strength, increased edema, postural dysfunction, obesity, and pain.   ACTIVITY LIMITATIONS: carrying, lifting, bending, sitting, standing, squatting, sleeping, stairs, transfers, bed mobility, and locomotion level  PARTICIPATION LIMITATIONS: meal prep, cleaning, driving, community activity, and church  PERSONAL FACTORS: Fitness, Past/current experiences, Time since onset of injury/illness/exacerbation, and 3+ comorbidities: see PMH  are also affecting patient's functional outcome.   REHAB POTENTIAL: Good  CLINICAL DECISION MAKING: Stable/uncomplicated  EVALUATION COMPLEXITY: Low   GOALS: Goals reviewed with patient? Yes  SHORT TERM GOALS: (target date for Short term goals 11/10/2022)   1.  Patient will demonstrate independent use of home exercise program to maintain progress from in clinic treatments.  Goal status: On Going 10/13/2022  2. PROM right knee ext -5* and flexion 90* Goal status: On Going 10/13/2022  3. Interim FOTO >32%  Goal status: New  LONG TERM GOALS: (target dates for all long term goals  12/29/2022 )   1. Patient will demonstrate/report pain at worst less than or equal to 2/10 to facilitate minimal limitation in daily activity secondary to pain symptoms.  Goal status: New   2. Patient will demonstrate independent use of home exercise program to facilitate ability to maintain/progress functional gains from skilled physical therapy  services.  Goal status: New   3. Patient will demonstrate FOTO outcome > or = 47 % to indicate reduced disability due to condition.  Goal status: New   4.  Patient will demonstrate right knee MMT 4/5 throughout to faciltiate usual transfers, stairs, squatting at [facility] for daily life.   Goal status: New   5.  Patient right knee AROM ext standing -2* and flexion seated or supine 100* Goal status: New   6.  patient amb with LRAD >500' and negotiates ramps/curbs/stairs modified independent.  Goal status: New   PLAN:  PT FREQUENCY:  2-3x/wk  PT DURATION: 12 weeks  PLANNED INTERVENTIONS: Therapeutic exercises, Therapeutic activity, Neuro Muscular re-education, Balance training, Gait training, Patient/Family education, Joint mobilization, Stair training, DME instructions, Dry Needling, Electrical stimulation, Traction, Cryotherapy, vasopneumatic deviceMoist heat, Taping, Ultrasound, Ionotophoresis 4mg /ml Dexamethasone, and aquatic therapy, Manual therapy.  All included unless contraindicated  PLAN FOR NEXT SESSION: continue with gentle manual therapy & exercise for range, add some standing balance/functional activities, vaso to end for edema.    Vladimir Faster, PT, DPT 10/16/2022, 4:09 PM

## 2022-10-18 ENCOUNTER — Ambulatory Visit (INDEPENDENT_AMBULATORY_CARE_PROVIDER_SITE_OTHER): Payer: Medicare Other | Admitting: Physical Therapy

## 2022-10-18 ENCOUNTER — Encounter: Payer: Self-pay | Admitting: Physical Therapy

## 2022-10-18 DIAGNOSIS — M25561 Pain in right knee: Secondary | ICD-10-CM | POA: Diagnosis not present

## 2022-10-18 DIAGNOSIS — M25661 Stiffness of right knee, not elsewhere classified: Secondary | ICD-10-CM | POA: Diagnosis not present

## 2022-10-18 DIAGNOSIS — R600 Localized edema: Secondary | ICD-10-CM

## 2022-10-18 DIAGNOSIS — M6281 Muscle weakness (generalized): Secondary | ICD-10-CM | POA: Diagnosis not present

## 2022-10-18 DIAGNOSIS — R2689 Other abnormalities of gait and mobility: Secondary | ICD-10-CM

## 2022-10-18 DIAGNOSIS — R2681 Unsteadiness on feet: Secondary | ICD-10-CM

## 2022-10-18 NOTE — Therapy (Signed)
OUTPATIENT PHYSICAL THERAPY LOWER EXTREMITY TREATMENT   Patient Name: Jessica [name] Rueter MRN: [id number] DOB:14-Jan-1951, 72 y.o., female Today's Date: 10/18/2022  END OF SESSION:  PT End of Session - 10/18/22 1511     Visit Number 4    Number of Visits 27    Date for PT Re-Evaluation 12/29/22    Authorization Type [employer] [facility]    Progress Note Due on Visit 10    PT Start Time 1511    PT Stop Time 1608    PT Time Calculation (min) 57 min    Equipment Utilized During Treatment Gait belt    Activity Tolerance Patient tolerated treatment well;Patient limited by pain;No increased pain    Behavior During Therapy [facility] for tasks assessed/performed              Past Medical History:  Diagnosis Date   Anxiety    Arthritis    bilateral knees   Asthma    childhood, inhaler for wheezing PRN   Autonomic neuropathy    diabetic   Cataract    Celiac disease    Constipation    Depression    Diabetes mellitus    type II, Hemoglobin A1C 9.9 10/05/2011   Diabetic neuropathy (HCC)    Diverticulosis    Fatty liver    Gastroparesis    GERD (gastroesophageal reflux disease)    History of claustrophobia    with MRI tests   Hyperlipidemia    Hypertension    IBS (irritable bowel syndrome)    Kidney stones    Personal history of colonic polyps 03/2010   hyperplastic   PONV (postoperative nausea and vomiting)    Shingles 2021   Past Surgical History:  Procedure Laterality Date   APPENDECTOMY     CATARACT EXTRACTION Right 04/29/2018   CATARACT EXTRACTION Left 03/2018   COLONOSCOPY     DILATATION & CURRETTAGE/HYSTEROSCOPY WITH RESECTOCOPE N/A 03/24/2013   Procedure: DILATATION & CURETTAGE, HYSTEROSCOPY WITH RESECTION;  Surgeon: Levi Aland, MD;  Location: [facility];  Service: Gynecology;  Laterality: N/A;   HYSTEROSCOPY WITH D & C N/A 11/08/2015   Procedure: DILATATION AND CURETTAGE /HYSTEROSCOPY;  Surgeon: Levi Aland, MD;  Location: [facility];  Service: Gynecology;  Laterality:  N/A;   left breast cyst removal  2000   LEFT HEART CATH AND CORONARY ANGIOGRAPHY N/A 10/31/2018   Procedure: LEFT HEART CATH AND CORONARY ANGIOGRAPHY;  Surgeon: Corky Crafts, MD;  Location: [facility];  Service: Cardiovascular;  Laterality: N/A;   TOTAL KNEE ARTHROPLASTY Right 09/12/2022   Procedure: RIGHT TOTAL KNEE ARTHROPLASTY;  Surgeon: Kathryne Hitch, MD;  Location: [facility];  Service: Orthopedics;  Laterality: Right;   TUBAL LIGATION     Patient Active Problem List   Diagnosis Date Noted   Status post total right knee replacement 09/12/2022   Degenerative arthritis of left knee 08/07/2022   Chronic rhinitis 04/18/2022   Gastroesophageal reflux disease 04/18/2022   Vitamin D deficiency 04/13/2022   Morbid obesity (HCC) 04/13/2022   Peripheral sensory neuropathy due to type 2 diabetes mellitus (HCC) 04/05/2020   Bilateral lower extremity edema 01/20/2020   Family history of malignant neoplasm of endometrium 10/08/2018   Celiac disease 02/14/2018   Osteopenia 02/14/2018   Mild intermittent asthma 02/14/2018   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) 02/14/2018   Class 2 obesity due to excess calories with body mass index (BMI) of 37.0 to 37.9 in adult 02/14/2018  Fibroma of tongue 06/06/2016   Renal calculi 01/13/2016   Degenerative cervical disc 07/27/2015   OSA (obstructive sleep apnea) 02/28/2011   Constipation, slow transit 09/23/2010   Type 2 diabetes mellitus with complication, with long-term current use of insulin (HCC) 09/23/2010   FH: colon cancer 09/23/2010   Personal history of colonic polyps 09/23/2010   Combined hyperlipidemia associated with type 2 diabetes mellitus (HCC) 01/29/2009   Diabetic gastroparesis (HCC) 02/27/2007   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 Mardelle Matte, MD   REFERRING PROVIDER: Vanita Panda.  Magnus Ivan, MD  REFERRING DIAG: [phone] (ICD-10-CM) - Status post total right knee replacement   THERAPY DIAG:  Stiffness of right knee, not elsewhere classified  Acute pain of right knee  Muscle weakness (generalized)  Localized edema  Other abnormalities of gait and mobility  Unsteadiness on feet  Rationale for Evaluation and Treatment: Rehabilitation  ONSET DATE: 09/12/2022  SUBJECTIVE:   SUBJECTIVE STATEMENT: She has been doing her exercises including sit to stand.  She is taking tylenol & Advil for pain, no Oxy.   PERTINENT HISTORY: Right TKA 09/12/2022, DM2, asthma, OA, peripheral neuropathy, osteopenia, shingles  PAIN:  NPRS scale: today 5/10 and last week 2-10/10 Pain location: right knee around joint line and in popliteal area.  Pain description: throbbing Aggravating factors: sit in car, positioning like bed Relieving factors: Tylenol, ice  PRECAUTIONS: Fall  WEIGHT BEARING RESTRICTIONS: No  FALLS:  Has patient fallen in last 6 months? No  LIVING ENVIRONMENT: Lives with: lives with their spouse Lives in: [place] with master handicap accessible bath & bedroom ramped entrance. Does not go upstairs.  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   PATIENT SURVEYS:  FOTO intake:  21%  predicted:  47%  COGNITION: Overall cognitive status: WFL    SENSATION: WFL  EDEMA:  Circumferential:  LLE: above knee 51.5cm,  around knee 50.2 cm, below knee 42.5 cm RLE: above knee 55.5 cm,  around knee 53.8 cm, below knee 46.2 cm  POSTURE: rounded shoulders, forward head,  flexed trunk , and weight shift left  PALPATION: Right knee tenderness along joint line & incision.   LOWER EXTREMITY ROM:   ROM Right eval Right 10/13/22 Right 10/16/22  Hip flexion     Hip extension     Hip abduction     Hip adduction     Hip internal rotation     Hip external rotation     Knee flexion Seated A: 64* P: 74* Active 75 AA 84*  Knee extension Seated A: LAQ -22* Seated with LE extended P: -11* Active -7 Supine A: -5*  Ankle dorsiflexion     Ankle plantarflexion     Ankle inversion     Ankle eversion      (Blank rows = not tested)  LOWER EXTREMITY MMT:  MMT Right eval  Hip flexion   Hip extension   Hip abduction   Hip adduction   Hip internal rotation   Hip external rotation   Knee flexion 3-/5  Knee  extension 3-/5  Ankle dorsiflexion   Ankle plantarflexion   Ankle inversion   Ankle eversion    (Blank rows = not tested)  FUNCTIONAL TESTS:  18 inch chair transfer: requires BUE support to arise from 22" w/c to RW for stabilization TUG with RW: 41.75 sec  GAIT: Distance walked: 20' Assistive device utilized: Environmental consultant - 2 wheeled Level of assistance: SBA Comments: Antalgic gait pattern with decreased stance duration right lower extremity, right knee flexed in stance and minimal increase flexion for swing phase and flexed trunk.   TODAY'S TREATMENT                                                                          DATE: 10/18/2022: Therapeutic Exercise: Nustep with BLEs & BUEs level 5 seat 8 for 8 min Sit to / from stand pushing up with BUEs on 18" chair seat (no armrests) 10 reps without touching RW Standing with posterior pelvis to counter with upright trunk with RLE TKE 10 reps 2 sets.  PT recommended walking program initially for 6 min, when tolerating with issues add 2 min progressing to walking 30 min. Pt verbalized understanding.   Self-Care: PT demo & verbal cues on getting in/out of car including managing RW. Practice gas to brake  with car running but in park. Speed up until you can tolerate sudden stop. Pt verbalized understanding. Using 24" bar stool for ADLs in kitchen to enable counter top activities with partial weight then standing some. Pt able to stand to wash her hands without support which she was surprised.  PT demo & verbal cues on positioning in bed with folded pillows tenting sheets off feet and pillow bw LEs in sidelying. Pt able to roll side to side and supine <> sit without RLE pain or need of assistance.   Manual therapy Contract-relax seated knee flexion. Contraction of quads worked much better than contraction of hamstring.  PROM with gentle overpressure  Vaso supine elevation with towel roll under heel for knee ext 34* medium compression 10 min  10/16/2022: Therapeutic Exercise: Nustep with BLEs & BUEs level 5 seat 8 for 8 min Sit to / from stand pushing up with BUEs on 18" chair seat (no armrests) 10 reps without touching RW Seated LAQ & active knee flexion with contralateral LE opposing motion 10 reps 2 sets Seated hamstring stretch seated with strap DF 30 sec hold 3 reps Heel slides with lower leg on ball & strap assistance 10 reps.   Manual therapy Contract-relax seated knee flexion. Contraction of quads worked much better than contraction of hamstring.  PROM with gentle overpressure  Vaso supine elevation with towel roll under heel for knee ext 34* medium compression 10 min    10/13/2022 Seated knee flexion/extension AROM 10 x 5 seconds Seated long arc quad 10 x 5 seconds [place] knee flexion 1 minute Seated AAROM knee flexion 10 x 10 seconds (left pushes right into flexion) Discussed SAQ  Quadriceps sets 2 sets of 10 for 5 seconds with heel prop Supine knee flexion with belt and PT assist 10 x 10 seconds  Vaso right knee 10 minutes Medium 34*    PATIENT EDUCATION:  Education details: HEP, POC Person educated: Patient Education method:  Explanation, Demonstration, Verbal cues,  and Handouts Education comprehension: verbalized understanding, returned demonstration, and verbal cues required  HOME EXERCISE PROGRAM: Access Code: [email or web address] URL: [email or web address] Date: 10/11/2022 Prepared by: Vladimir Faster  Exercises - Ankle Alphabet in Elevation  - 2-4 x daily - 7 x weekly - 1 sets - 1 reps - supine quad set with towel roll under ankle  - 2-4 x daily - 7 x weekly - 2 sets - 10 reps - 5 seconds hold - Supine Heel Slide with Strap  - 2-3 x daily - 7 x weekly - 2-3 sets - 10 reps - 5 seconds hold - Seated Knee Flexion Extension AROM   - 2-4 x daily - 7 x weekly - 2-3 sets - 10 reps - 5 seconds hold - Supine Knee Extension Strengthening  - 2-4 x daily - 7 x weekly - 2-3 sets - 10 reps - 5 seconds hold - Seated Long Arc Quad  - 2-4 x daily - 7 x weekly - 2-3 sets - 10 reps - 5 seconds hold  ASSESSMENT: CLINICAL IMPRESSION: PT educated patient on self-care including pre-driving, kitchen ADLs and positioning in bed which she appears to understand and feels will improve her mobility / activity tolerance.  Pt is tolerating manual therapy better. Pt continues to benefit from skilled PT.   OBJECTIVE IMPAIRMENTS: Abnormal gait, decreased activity tolerance, decreased balance, decreased endurance, decreased knowledge of condition, decreased knowledge of use of DME, decreased mobility, difficulty walking, decreased ROM, decreased strength, increased edema, postural dysfunction, obesity, and pain.   ACTIVITY LIMITATIONS: carrying, lifting, bending, sitting, standing, squatting, sleeping, stairs, transfers, bed mobility, and locomotion level  PARTICIPATION LIMITATIONS: meal prep, cleaning, driving, community activity, and church  PERSONAL FACTORS: Fitness, Past/current experiences, Time since onset of injury/illness/exacerbation, and 3+ comorbidities: see PMH  are also affecting patient's functional outcome.   REHAB POTENTIAL: Good  CLINICAL DECISION MAKING:  Stable/uncomplicated  EVALUATION COMPLEXITY: Low   GOALS: Goals reviewed with patient? Yes  SHORT TERM GOALS: (target date for Short term goals 11/10/2022)   1.  Patient will demonstrate independent use of home exercise program to maintain progress from in clinic treatments. Baseline: see objective data Goal status: On Going 10/13/2022  2. PROM right knee ext -5* and flexion 90* Baseline: see objective data Goal status: On Going 10/13/2022  3. Interim FOTO >32% Baseline: see objective data  Goal status: New  LONG TERM GOALS: (target dates for all long term goals  12/29/2022 )   1. Patient will demonstrate/report pain at worst less than or equal to 2/10 to facilitate minimal limitation in daily activity secondary to pain symptoms. Baseline: see objective data Goal status: New   2. Patient will demonstrate independent use of home exercise program to facilitate ability to maintain/progress functional gains from skilled physical therapy services. Baseline: see objective data Goal status: New   3. Patient will demonstrate FOTO outcome > or = 47 % to indicate reduced disability due to condition. Baseline: see objective data Goal status: New   4.  Patient will demonstrate right knee MMT 4/5 throughout to faciltiate usual transfers, stairs, squatting at [facility] for daily life.  Baseline: see objective data Goal status: New   5.  Patient right knee AROM ext standing -2* and flexion seated or supine 100* Baseline: see objective data Goal status: New   6.  patient amb with LRAD >500' and negotiates ramps/curbs/stairs modified independent.  Baseline: see objective data Goal status: New   PLAN:  PT FREQUENCY:  2-3x/wk  PT DURATION: 12 weeks  PLANNED INTERVENTIONS: Therapeutic exercises, Therapeutic activity, Neuro Muscular re-education, Balance training, Gait training, Patient/Family education, Joint mobilization, Stair training, DME instructions, Dry Needling, Electrical  stimulation, Traction, Cryotherapy, vasopneumatic deviceMoist heat, Taping, Ultrasound, Ionotophoresis 4mg /ml Dexamethasone, and aquatic therapy, Manual therapy.  All included unless contraindicated  PLAN FOR NEXT SESSION: try Sci[employer] bike and leg press, check how self-care activities above went,  continue with gentle manual therapy & exercise for range, add some standing balance/functional activities, vaso to end for edema.    Vladimir Faster, PT, DPT 10/18/2022, 4:15 PM

## 2022-10-19 ENCOUNTER — Encounter: Payer: Self-pay | Admitting: Physical Therapy

## 2022-10-19 ENCOUNTER — Ambulatory Visit (INDEPENDENT_AMBULATORY_CARE_PROVIDER_SITE_OTHER): Payer: Medicare Other | Admitting: Physical Therapy

## 2022-10-19 ENCOUNTER — Ambulatory Visit: Payer: Medicare Other | Admitting: Bariatrics

## 2022-10-19 DIAGNOSIS — M6281 Muscle weakness (generalized): Secondary | ICD-10-CM | POA: Diagnosis not present

## 2022-10-19 DIAGNOSIS — M25661 Stiffness of right knee, not elsewhere classified: Secondary | ICD-10-CM | POA: Diagnosis not present

## 2022-10-19 DIAGNOSIS — R2689 Other abnormalities of gait and mobility: Secondary | ICD-10-CM

## 2022-10-19 DIAGNOSIS — R600 Localized edema: Secondary | ICD-10-CM

## 2022-10-19 DIAGNOSIS — M25561 Pain in right knee: Secondary | ICD-10-CM

## 2022-10-19 DIAGNOSIS — R2681 Unsteadiness on feet: Secondary | ICD-10-CM

## 2022-10-19 NOTE — Therapy (Signed)
OUTPATIENT PHYSICAL THERAPY LOWER EXTREMITY TREATMENT   Patient Name: Jessica [name] MRN: [id number] DOB:02-27-1951, 72 y.o., female Today's Date: 10/19/2022  END OF SESSION:  PT End of Session - 10/19/22 1353     Visit Number 5    Number of Visits 27    Date for PT Re-Evaluation 12/29/22    Authorization Type [employer]    Progress Note Due on Visit 10    PT Start Time 1345    PT Stop Time 1430    PT Time Calculation (min) 45 min    Activity Tolerance Patient tolerated treatment well;Patient limited by pain;No increased pain    Behavior During Therapy [facility] for tasks assessed/performed              Past Medical History:  Diagnosis Date   Anxiety    Arthritis    bilateral knees   Asthma    childhood, inhaler for wheezing PRN   Autonomic neuropathy    diabetic   Cataract    Celiac disease    Constipation    Depression    Diabetes mellitus    type II, Hemoglobin A1C 9.9 10/05/2011   Diabetic neuropathy (HCC)    Diverticulosis    Fatty liver    Gastroparesis    GERD (gastroesophageal reflux disease)    History of claustrophobia    with MRI tests   Hyperlipidemia    Hypertension    IBS (irritable bowel syndrome)    Kidney stones    Personal history of colonic polyps 03/2010   hyperplastic   PONV (postoperative nausea and vomiting)    Shingles 2021   Past Surgical History:  Procedure Laterality Date   APPENDECTOMY     CATARACT EXTRACTION Right 04/29/2018   CATARACT EXTRACTION Left 03/2018   COLONOSCOPY     DILATATION & CURRETTAGE/HYSTEROSCOPY WITH RESECTOCOPE N/A 03/24/2013   Procedure: DILATATION & CURETTAGE, HYSTEROSCOPY WITH RESECTION;  Surgeon: Levi Aland, MD;  Location: [facility];  Service: Gynecology;  Laterality: N/A;   HYSTEROSCOPY WITH D & C N/A 11/08/2015   Procedure: DILATATION AND CURETTAGE /HYSTEROSCOPY;  Surgeon: Levi Aland, MD;  Location: [facility];  Service: Gynecology;  Laterality: N/A;   left breast cyst removal  2000   LEFT HEART  CATH AND CORONARY ANGIOGRAPHY N/A 10/31/2018   Procedure: LEFT HEART CATH AND CORONARY ANGIOGRAPHY;  Surgeon: Corky Crafts, MD;  Location: [facility] INVASIVE CV LAB;  Service: Cardiovascular;  Laterality: N/A;   TOTAL KNEE ARTHROPLASTY Right 09/12/2022   Procedure: RIGHT TOTAL KNEE ARTHROPLASTY;  Surgeon: Kathryne Hitch, MD;  Location: [facility];  Service: Orthopedics;  Laterality: Right;   TUBAL LIGATION     Patient Active Problem List   Diagnosis Date Noted   Status post total right knee replacement 09/12/2022   Degenerative arthritis of left knee 08/07/2022   Chronic rhinitis 04/18/2022   Gastroesophageal reflux disease 04/18/2022   Vitamin D deficiency 04/13/2022   Morbid obesity (HCC) 04/13/2022   Peripheral sensory neuropathy due to type 2 diabetes mellitus (HCC) 04/05/2020   Bilateral lower extremity edema 01/20/2020   Family history of malignant neoplasm of endometrium 10/08/2018   Celiac disease 02/14/2018   Osteopenia 02/14/2018   Mild intermittent asthma 02/14/2018   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) 02/14/2018   Class 2 obesity due to excess calories with body mass index (BMI) of 37.0 to 37.9 in adult 02/14/2018   Fibroma of tongue 06/06/2016   Renal calculi 01/13/2016  Degenerative cervical disc 07/27/2015   OSA (obstructive sleep apnea) 02/28/2011   Constipation, slow transit 09/23/2010   Type 2 diabetes mellitus with complication, with long-term current use of insulin (HCC) 09/23/2010   FH: colon cancer 09/23/2010   Personal history of colonic polyps 09/23/2010   Combined hyperlipidemia associated with type 2 diabetes mellitus (HCC) 01/29/2009   Diabetic gastroparesis (HCC) 02/27/2007   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 Mardelle Matte, MD   REFERRING PROVIDER: Vanita Panda. Magnus Ivan, MD  REFERRING DIAG: [phone] (ICD-10-CM) -  Status post total right knee replacement   THERAPY DIAG:  Stiffness of right knee, not elsewhere classified  Acute pain of right knee  Muscle weakness (generalized)  Localized edema  Other abnormalities of gait and mobility  Unsteadiness on feet  Rationale for Evaluation and Treatment: Rehabilitation  ONSET DATE: 09/12/2022  SUBJECTIVE:   SUBJECTIVE STATEMENT: She relays some knee pain after riding in car 25 minutes to arrive at her PT today.   PERTINENT HISTORY: Right TKA 09/12/2022, DM2, asthma, OA, peripheral neuropathy, osteopenia, shingles  PAIN:  NPRS scale: today 5/10 Pain location: right knee around joint line and in popliteal area.  Pain description: throbbing Aggravating factors: sit in car, positioning like bed Relieving factors: Tylenol, ice  PRECAUTIONS: Fall  WEIGHT BEARING RESTRICTIONS: No  FALLS:  Has patient fallen in last 6 months? No  LIVING ENVIRONMENT: Lives with: lives with their spouse Lives in: [place] with master handicap accessible bath & bedroom ramped entrance. Does not go upstairs.  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   PATIENT SURVEYS:  FOTO intake:  21%  predicted:  47%  COGNITION: Overall cognitive status: WFL    SENSATION: WFL  EDEMA:  Circumferential:  LLE: above knee 51.5cm,  around knee 50.2 cm, below knee 42.5 cm RLE: above knee 55.5 cm,  around knee 53.8 cm, below knee 46.2 cm  POSTURE: rounded shoulders, forward head, flexed trunk , and weight shift left  PALPATION: Right knee tenderness along joint line &  incision.   LOWER EXTREMITY ROM:   ROM Right eval Right 10/13/22 Right 10/16/22  Hip flexion     Hip extension     Hip abduction     Hip adduction     Hip internal rotation     Hip external rotation     Knee flexion Seated A: 64* P: 74* Active 75 AA 84*  Knee extension Seated A: LAQ -22* Seated with LE extended P: -11* Active -7 Supine A: -5*  Ankle dorsiflexion     Ankle plantarflexion     Ankle inversion     Ankle eversion      (Blank rows = not tested)  LOWER EXTREMITY MMT:  MMT Right eval  Hip flexion   Hip extension   Hip abduction   Hip adduction   Hip internal rotation   Hip external rotation   Knee flexion 3-/5  Knee extension 3-/5  Ankle dorsiflexion   Ankle plantarflexion   Ankle inversion   Ankle eversion    (  Blank rows = not tested)  FUNCTIONAL TESTS:  18 inch chair transfer: requires BUE support to arise from 22" w/c to RW for stabilization TUG with RW: 41.75 sec  GAIT: Distance walked: 20' Assistive device utilized: Environmental consultant - 2 wheeled Level of assistance: SBA Comments: Antalgic gait pattern with decreased stance duration right lower extremity, right knee flexed in stance and minimal increase flexion for swing phase and flexed trunk.   TODAY'S TREATMENT                                                                          DATE: 10/19/2022: Therapeutic Exercise: Sci fit bike rocking seat #12 X 8 min Leg press DL 16# 1W96, Rt leg only 04# 2X10 Seated LAQ alternating with contralateral leg flexion/extension 2X10 Seated knee flexion stretch AAROM 5 sec hold 2X10 Supine quad sets 5 sec hold 2X10  Manual therapy PROM with gentle overpressure to tolerance with 5 sec holds at end range  Vasopnuematic supine elevation  medium compression 10 min to Rt knee  10/18/2022: Therapeutic Exercise: Nustep with BLEs & BUEs level 5 seat 8 for 8 min Sit to / from stand pushing up with BUEs on 18" chair seat (no armrests) 10 reps without touching  RW Standing with posterior pelvis to counter with upright trunk with RLE TKE 10 reps 2 sets.  PT recommended walking program initially for 6 min, when tolerating with issues add 2 min progressing to walking 30 min. Pt verbalized understanding.   Self-Care: PT demo & verbal cues on getting in/out of car including managing RW. Practice gas to brake with car running but in park. Speed up until you can tolerate sudden stop. Pt verbalized understanding. Using 24" bar stool for ADLs in kitchen to enable counter top activities with partial weight then standing some. Pt able to stand to wash her hands without support which she was surprised.  PT demo & verbal cues on positioning in bed with folded pillows tenting sheets off feet and pillow bw LEs in sidelying. Pt able to roll side to side and supine <> sit without RLE pain or need of assistance.   Manual therapy Contract-relax seated knee flexion. Contraction of quads worked much better than contraction of hamstring.  PROM with gentle overpressure  Vaso supine elevation with towel roll under heel for knee ext 34* medium compression 10 min  10/16/2022: Therapeutic Exercise: Nustep with BLEs & BUEs level 5 seat 8 for 8 min Sit to / from stand pushing up with BUEs on 18" chair seat (no armrests) 10 reps without touching RW Seated LAQ & active knee flexion with contralateral LE opposing motion 10 reps 2 sets Seated hamstring stretch seated with strap DF 30 sec hold 3 reps Heel slides with lower leg on ball & strap assistance 10 reps.   Manual therapy Contract-relax seated knee flexion. Contraction of quads worked much better than contraction of hamstring.  PROM with gentle overpressure  Vaso supine elevation with towel roll under heel for knee ext 34* medium compression 10 min    10/13/2022 Seated knee flexion/extension AROM 10 x 5 seconds Seated long arc quad 10 x 5 seconds [place] knee flexion 1 minute Seated AAROM knee flexion 10 x 10  seconds (left pushes right into flexion) Discussed SAQ  Quadriceps sets 2 sets of 10 for 5 seconds with heel prop Supine knee flexion with belt and PT assist 10 x 10 seconds  Vaso right knee 10 minutes Medium 34*    PATIENT EDUCATION:  Education details: HEP, POC Person educated: Patient Education method: Programmer, multimedia, Demonstration, Verbal cues, and Handouts Education comprehension: verbalized understanding, returned demonstration, and verbal cues required  HOME EXERCISE PROGRAM: Access Code: [email or web address] URL: [email or web address] Date: 10/11/2022 Prepared by: Vladimir Faster  Exercises - Ankle Alphabet in Elevation  - 2-4 x daily - 7 x weekly - 1 sets - 1 reps - supine quad set with towel roll under ankle  - 2-4 x daily - 7 x weekly - 2 sets - 10 reps - 5 seconds hold - Supine Heel Slide with Strap  - 2-3 x daily - 7 x weekly - 2-3 sets - 10 reps - 5 seconds hold - Seated Knee Flexion Extension AROM   - 2-4 x daily - 7 x weekly - 2-3 sets - 10 reps - 5 seconds hold - Supine Knee Extension Strengthening  - 2-4 x daily - 7 x weekly - 2-3 sets - 10 reps - 5 seconds hold - Seated Long Arc Quad  - 2-4 x daily - 7 x weekly - 2-3 sets - 10 reps - 5 seconds hold  ASSESSMENT: CLINICAL IMPRESSION: She had good overall tolerance to session focusing on Rt knee ROM and strengthening. She did appear to have improved ROM by end of session. She will continue to benefit from PT  OBJECTIVE IMPAIRMENTS: Abnormal gait, decreased activity tolerance, decreased balance, decreased endurance, decreased knowledge of condition, decreased knowledge of use of DME, decreased mobility, difficulty walking, decreased ROM, decreased strength, increased edema, postural dysfunction, obesity, and pain.   ACTIVITY LIMITATIONS: carrying, lifting, bending, sitting, standing, squatting, sleeping, stairs, transfers, bed mobility, and locomotion level  PARTICIPATION LIMITATIONS: meal prep, cleaning, driving,  community activity, and church  PERSONAL FACTORS: Fitness, Past/current experiences, Time since onset of injury/illness/exacerbation, and 3+ comorbidities: see PMH  are also affecting patient's functional outcome.   REHAB POTENTIAL: Good  CLINICAL DECISION MAKING: Stable/uncomplicated  EVALUATION COMPLEXITY: Low   GOALS: Goals reviewed with patient? Yes  SHORT TERM GOALS: (target date for Short term goals 11/10/2022)   1.  Patient will demonstrate independent use of home exercise program to maintain progress from in clinic treatments. Baseline: see objective data Goal status: On Going 10/13/2022  2. PROM right knee ext -5* and flexion 90* Baseline: see objective data Goal status: On Going 10/13/2022  3. Interim FOTO >32% Baseline: see objective data  Goal status: New  LONG TERM GOALS: (target dates for all long term goals  12/29/2022 )   1. Patient will demonstrate/report pain at worst less than or equal to 2/10 to facilitate minimal limitation in daily activity secondary to pain symptoms. Baseline: see objective data Goal status: New   2. Patient will demonstrate independent use of home exercise program to facilitate ability to maintain/progress functional gains from skilled physical therapy services. Baseline: see objective data Goal status: New   3. Patient will demonstrate FOTO outcome > or = 47 % to indicate reduced disability due to condition. Baseline: see objective data Goal status: New   4.  Patient will demonstrate right knee MMT 4/5 throughout to faciltiate usual transfers, stairs, squatting at [facility] for daily life.  Baseline: see objective data Goal status: New   5.  Patient right knee AROM  ext standing -2* and flexion seated or supine 100* Baseline: see objective data Goal status: New   6.  patient amb with LRAD >500' and negotiates ramps/curbs/stairs modified independent.  Baseline: see objective data Goal status: New   PLAN:  PT FREQUENCY:   2-3x/wk  PT DURATION: 12 weeks  PLANNED INTERVENTIONS: Therapeutic exercises, Therapeutic activity, Neuro Muscular re-education, Balance training, Gait training, Patient/Family education, Joint mobilization, Stair training, DME instructions, Dry Needling, Electrical stimulation, Traction, Cryotherapy, vasopneumatic deviceMoist heat, Taping, Ultrasound, Ionotophoresis 4mg /ml Dexamethasone, and aquatic therapy, Manual therapy.  All included unless contraindicated  PLAN FOR NEXT SESSION:  continue with gentle manual therapy & exercise for range, add some standing balance/functional activities, vaso to end for edema.    April Manson, PT, DPT 10/19/2022, 2:23 PM

## 2022-10-24 ENCOUNTER — Ambulatory Visit (INDEPENDENT_AMBULATORY_CARE_PROVIDER_SITE_OTHER): Payer: Medicare Other | Admitting: Physical Therapy

## 2022-10-24 ENCOUNTER — Encounter: Payer: Self-pay | Admitting: Physical Therapy

## 2022-10-24 DIAGNOSIS — M25561 Pain in right knee: Secondary | ICD-10-CM

## 2022-10-24 DIAGNOSIS — M25661 Stiffness of right knee, not elsewhere classified: Secondary | ICD-10-CM

## 2022-10-24 DIAGNOSIS — R600 Localized edema: Secondary | ICD-10-CM

## 2022-10-24 DIAGNOSIS — R2681 Unsteadiness on feet: Secondary | ICD-10-CM

## 2022-10-24 DIAGNOSIS — M6281 Muscle weakness (generalized): Secondary | ICD-10-CM

## 2022-10-24 DIAGNOSIS — R2689 Other abnormalities of gait and mobility: Secondary | ICD-10-CM

## 2022-10-24 NOTE — Therapy (Signed)
OUTPATIENT PHYSICAL THERAPY LOWER EXTREMITY TREATMENT   Patient Name: Jessica [name] MRN: [id number] DOB:Mar 20, 1951, 72 y.o., female Today's Date: 10/24/2022  END OF SESSION:  PT End of Session - 10/24/22 1350     Visit Number 6    Number of Visits 27    Date for PT Re-Evaluation 12/29/22    Authorization Type [employer]    Progress Note Due on Visit 10    PT Start Time 1345    PT Stop Time 1440    PT Time Calculation (min) 55 min    Activity Tolerance Patient tolerated treatment well;Patient limited by pain;No increased pain    Behavior During Therapy [facility] for tasks assessed/performed              Past Medical History:  Diagnosis Date   Anxiety    Arthritis    bilateral knees   Asthma    childhood, inhaler for wheezing PRN   Autonomic neuropathy    diabetic   Cataract    Celiac disease    Constipation    Depression    Diabetes mellitus    type II, Hemoglobin A1C 9.9 10/05/2011   Diabetic neuropathy (HCC)    Diverticulosis    Fatty liver    Gastroparesis    GERD (gastroesophageal reflux disease)    History of claustrophobia    with MRI tests   Hyperlipidemia    Hypertension    IBS (irritable bowel syndrome)    Kidney stones    Personal history of colonic polyps 03/2010   hyperplastic   PONV (postoperative nausea and vomiting)    Shingles 2021   Past Surgical History:  Procedure Laterality Date   APPENDECTOMY     CATARACT EXTRACTION Right 04/29/2018   CATARACT EXTRACTION Left 03/2018   COLONOSCOPY     DILATATION & CURRETTAGE/HYSTEROSCOPY WITH RESECTOCOPE N/A 03/24/2013   Procedure: DILATATION & CURETTAGE, HYSTEROSCOPY WITH RESECTION;  Surgeon: Levi Aland, MD;  Location: [facility];  Service: Gynecology;  Laterality: N/A;   HYSTEROSCOPY WITH D & C N/A 11/08/2015   Procedure: DILATATION AND CURETTAGE /HYSTEROSCOPY;  Surgeon: Levi Aland, MD;  Location: [facility];  Service: Gynecology;  Laterality: N/A;   left breast cyst removal  2000   LEFT HEART  CATH AND CORONARY ANGIOGRAPHY N/A 10/31/2018   Procedure: LEFT HEART CATH AND CORONARY ANGIOGRAPHY;  Surgeon: Corky Crafts, MD;  Location: [facility] INVASIVE CV LAB;  Service: Cardiovascular;  Laterality: N/A;   TOTAL KNEE ARTHROPLASTY Right 09/12/2022   Procedure: RIGHT TOTAL KNEE ARTHROPLASTY;  Surgeon: Kathryne Hitch, MD;  Location: [facility] OR;  Service: Orthopedics;  Laterality: Right;   TUBAL LIGATION     Patient Active Problem List   Diagnosis Date Noted   Status post total right knee replacement 09/12/2022   Degenerative arthritis of left knee 08/07/2022   Chronic rhinitis 04/18/2022   Gastroesophageal reflux disease 04/18/2022   Vitamin D deficiency 04/13/2022   Morbid obesity (HCC) 04/13/2022   Peripheral sensory neuropathy due to type 2 diabetes mellitus (HCC) 04/05/2020   Bilateral lower extremity edema 01/20/2020   Family history of malignant neoplasm of endometrium 10/08/2018   Celiac disease 02/14/2018   Osteopenia 02/14/2018   Mild intermittent asthma 02/14/2018   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) 02/14/2018   Class 2 obesity due to excess calories with body mass index (BMI) of 37.0 to 37.9 in adult 02/14/2018   Fibroma of tongue 06/06/2016   Renal calculi 01/13/2016  Degenerative cervical disc 07/27/2015   OSA (obstructive sleep apnea) 02/28/2011   Constipation, slow transit 09/23/2010   Type 2 diabetes mellitus with complication, with long-term current use of insulin (HCC) 09/23/2010   FH: colon cancer 09/23/2010   Personal history of colonic polyps 09/23/2010   Combined hyperlipidemia associated with type 2 diabetes mellitus (HCC) 01/29/2009   Diabetic gastroparesis (HCC) 02/27/2007   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 Mardelle Matte, MD   REFERRING PROVIDER: Vanita Panda. Magnus Ivan, MD  REFERRING DIAG: [phone] (ICD-10-CM) -  Status post total right knee replacement   THERAPY DIAG:  Stiffness of right knee, not elsewhere classified  Acute pain of right knee  Muscle weakness (generalized)  Localized edema  Other abnormalities of gait and mobility  Unsteadiness on feet  Rationale for Evaluation and Treatment: Rehabilitation  ONSET DATE: 09/12/2022  SUBJECTIVE:   SUBJECTIVE STATEMENT: She sat in car working on gas to brake with no pain. She walked 12 min with RW.  She is using stool in kitchen and helped increase her activity tolerance. She used pillows under covers and between LEs but not able to get comfortable yet.    PERTINENT HISTORY: Right TKA 09/12/2022, DM2, asthma, OA, peripheral neuropathy, osteopenia, shingles  PAIN:  NPRS scale: today  5/10 riding in car, walking 3/10 and since last PT 8-9/10 at night Pain location: right knee around joint line and in popliteal area.  Pain description: throbbing Aggravating factors: sit in car, positioning like bed Relieving factors: Tylenol, ice  PRECAUTIONS: Fall  WEIGHT BEARING RESTRICTIONS: No  FALLS:  Has patient fallen in last 6 months? No  LIVING ENVIRONMENT: Lives with: lives with their spouse Lives in: [place] with master handicap accessible bath & bedroom ramped entrance. Does not go upstairs.  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   PATIENT SURVEYS:  FOTO intake:  21%  predicted:  47%  COGNITION: Overall cognitive status: WFL    SENSATION: WFL  EDEMA:  Circumferential:  LLE: above knee 51.5cm,  around knee 50.2 cm, below knee  42.5 cm RLE: above knee 55.5 cm,  around knee 53.8 cm, below knee 46.2 cm  POSTURE: rounded shoulders, forward head, flexed trunk , and weight shift left  PALPATION: Right knee tenderness along joint line & incision.   LOWER EXTREMITY ROM:   ROM Right eval Right 10/13/22 Right 10/16/22  Hip flexion     Hip extension     Hip abduction     Hip adduction     Hip internal rotation     Hip external rotation     Knee flexion Seated A: 64* P: 74* Active 75 AA 84*  Knee extension Seated A: LAQ -22* Seated with LE extended P: -11* Active -7 Supine A: -5*  Ankle dorsiflexion     Ankle plantarflexion     Ankle inversion     Ankle eversion      (Blank rows = not tested)  LOWER EXTREMITY MMT:  MMT Right eval  Hip flexion   Hip extension  Hip abduction   Hip adduction   Hip internal rotation   Hip external rotation   Knee flexion 3-/5  Knee extension 3-/5  Ankle dorsiflexion   Ankle plantarflexion   Ankle inversion   Ankle eversion    (Blank rows = not tested)  FUNCTIONAL TESTS:  18 inch chair transfer: requires BUE support to arise from 22" w/c to RW for stabilization TUG with RW: 41.75 sec  GAIT: Distance walked: 20' Assistive device utilized: Environmental consultant - 2 wheeled Level of assistance: SBA Comments: Antalgic gait pattern with decreased stance duration right lower extremity, right knee flexed in stance and minimal increase flexion for swing phase and flexed trunk.   TODAY'S TREATMENT                                                                          DATE: 10/24/2022: Therapeutic Exercise: SciFit seat 12 full revolutions BLEs/BUEs level 1 for 8 min Leg press DL 16# [id number], Rt leg only 04# 2X10 Supine knee to chest with ball & strap 10 reps 2 sets  Therapeutic Activities: Sit to / from stand using UEs on seat 10 reps; PT demo & verbal cues on technique as PT assessed arising in waiting area with little use of RLE Pt amb 50' with cane stand alone tip with CGA then  SBA.  Manual therapy Contract-relax seated knee flexion.  PROM with gentle overpressure  Vaso supine elevation with towel roll under heel for knee ext 34* medium compression 10 min  10/19/2022: Therapeutic Exercise: Sci fit bike rocking seat #12 X 8 min Leg press DL 54# [id number], Rt leg only 19# 2X10 Seated LAQ alternating with contralateral leg flexion/extension 2X10 Seated knee flexion stretch AAROM 5 sec hold 2X10 Supine quad sets 5 sec hold 2X10  Manual therapy PROM with gentle overpressure to tolerance with 5 sec holds at end range  Vasopnuematic supine elevation  medium compression 10 min to Rt knee  10/18/2022: Therapeutic Exercise: Nustep with BLEs & BUEs level 5 seat 8 for 8 min Sit to / from stand pushing up with BUEs on 18" chair seat (no armrests) 10 reps without touching RW Standing with posterior pelvis to counter with upright trunk with RLE TKE 10 reps 2 sets.  PT recommended walking program initially for 6 min, when tolerating with issues add 2 min progressing to walking 30 min. Pt verbalized understanding.   Self-Care: PT demo & verbal cues on getting in/out of car including managing RW. Practice gas to brake with car running but in park. Speed up until you can tolerate sudden stop. Pt verbalized understanding. Using 24" bar stool for ADLs in kitchen to enable counter top activities with partial weight then standing some. Pt able to stand to wash her hands without support which she was surprised.  PT demo & verbal cues on positioning in bed with folded pillows tenting sheets off feet and pillow bw LEs in sidelying. Pt able to roll side to side and supine <> sit without RLE pain or need of assistance.   Manual therapy Contract-relax seated knee flexion. Contraction of quads worked much better than contraction of hamstring.  PROM with gentle overpressure  Vaso supine elevation with towel roll under heel for knee  ext 34* medium compression 10 min   PATIENT EDUCATION:   Education details: HEP, POC Person educated: Patient Education method: Explanation, Demonstration, Verbal cues, and Handouts Education comprehension: verbalized understanding, returned demonstration, and verbal cues required  HOME EXERCISE PROGRAM: Access Code: [email or web address] URL: [email or web address] Date: 10/11/2022 Prepared by: Vladimir Faster  Exercises - Ankle Alphabet in Elevation  - 2-4 x daily - 7 x weekly - 1 sets - 1 reps - supine quad set with towel roll under ankle  - 2-4 x daily - 7 x weekly - 2 sets - 10 reps - 5 seconds hold - Supine Heel Slide with Strap  - 2-3 x daily - 7 x weekly - 2-3 sets - 10 reps - 5 seconds hold - Seated Knee Flexion Extension AROM   - 2-4 x daily - 7 x weekly - 2-3 sets - 10 reps - 5 seconds hold - Supine Knee Extension Strengthening  - 2-4 x daily - 7 x weekly - 2-3 sets - 10 reps - 5 seconds hold - Seated Long Arc Quad  - 2-4 x daily - 7 x weekly - 2-3 sets - 10 reps - 5 seconds hold  ASSESSMENT: CLINICAL IMPRESSION: Patient reports self-care has improved except sleeping.  Pt continues to have painful end feel limiting her flexion & extension. She will continue to benefit from PT  OBJECTIVE IMPAIRMENTS: Abnormal gait, decreased activity tolerance, decreased balance, decreased endurance, decreased knowledge of condition, decreased knowledge of use of DME, decreased mobility, difficulty walking, decreased ROM, decreased strength, increased edema, postural dysfunction, obesity, and pain.   ACTIVITY LIMITATIONS: carrying, lifting, bending, sitting, standing, squatting, sleeping, stairs, transfers, bed mobility, and locomotion level  PARTICIPATION LIMITATIONS: meal prep, cleaning, driving, community activity, and church  PERSONAL FACTORS: Fitness, Past/current experiences, Time since onset of injury/illness/exacerbation, and 3+ comorbidities: see PMH  are also affecting patient's functional outcome.   REHAB POTENTIAL: Good  CLINICAL  DECISION MAKING: Stable/uncomplicated  EVALUATION COMPLEXITY: Low   GOALS: Goals reviewed with patient? Yes  SHORT TERM GOALS: (target date for Short term goals 11/10/2022)   1.  Patient will demonstrate independent use of home exercise program to maintain progress from in clinic treatments. Baseline: see objective data Goal status: Ongoing 10/24/2022  2. PROM right knee ext -5* and flexion 90* Baseline: see objective data Goal status: Ongoing 10/24/2022  3. Interim FOTO >32% Baseline: see objective data  Goal status: Ongoing 10/24/2022  LONG TERM GOALS: (target dates for all long term goals  12/29/2022 )   1. Patient will demonstrate/report pain at worst less than or equal to 2/10 to facilitate minimal limitation in daily activity secondary to pain symptoms. Baseline: see objective data Goal status: Ongoing 10/24/2022   2. Patient will demonstrate independent use of home exercise program to facilitate ability to maintain/progress functional gains from skilled physical therapy services. Baseline: see objective data Goal status: Ongoing 10/24/2022   3. Patient will demonstrate FOTO outcome > or = 47 % to indicate reduced disability due to condition. Baseline: see objective data Goal status: Ongoing 10/24/2022   4.  Patient will demonstrate right knee MMT 4/5 throughout to faciltiate usual transfers, stairs, squatting at [facility] for daily life.  Baseline: see objective data Goal status: Ongoing 10/24/2022   5.  Patient right knee AROM ext standing -2* and flexion seated or supine 100* Baseline: see objective data Goal status: Ongoing 10/24/2022   6.  patient amb with LRAD >500' and negotiates ramps/curbs/stairs modified independent.  Baseline: see objective data Goal  status: Ongoing 10/24/2022   PLAN:  PT FREQUENCY:  2-3x/wk  PT DURATION: 12 weeks  PLANNED INTERVENTIONS: Therapeutic exercises, Therapeutic activity, Neuro Muscular re-education, Balance training, Gait training,  Patient/Family education, Joint mobilization, Stair training, DME instructions, Dry Needling, Electrical stimulation, Traction, Cryotherapy, vasopneumatic deviceMoist heat, Taping, Ultrasound, Ionotophoresis 4mg /ml Dexamethasone, and aquatic therapy, Manual therapy.  All included unless contraindicated  PLAN FOR NEXT SESSION:  manual therapy & exercise for range, add some standing balance/functional activities, vaso to end for edema.   Vladimir Faster, PT, DPT 10/24/2022, 2:59 PM

## 2022-10-25 ENCOUNTER — Ambulatory Visit (INDEPENDENT_AMBULATORY_CARE_PROVIDER_SITE_OTHER): Payer: Medicare Other | Admitting: Physical Therapy

## 2022-10-25 ENCOUNTER — Encounter: Payer: Self-pay | Admitting: Physical Therapy

## 2022-10-25 DIAGNOSIS — M25561 Pain in right knee: Secondary | ICD-10-CM

## 2022-10-25 DIAGNOSIS — M6281 Muscle weakness (generalized): Secondary | ICD-10-CM

## 2022-10-25 DIAGNOSIS — R600 Localized edema: Secondary | ICD-10-CM

## 2022-10-25 DIAGNOSIS — M25661 Stiffness of right knee, not elsewhere classified: Secondary | ICD-10-CM

## 2022-10-25 DIAGNOSIS — R2681 Unsteadiness on feet: Secondary | ICD-10-CM

## 2022-10-25 DIAGNOSIS — R2689 Other abnormalities of gait and mobility: Secondary | ICD-10-CM

## 2022-10-25 NOTE — Therapy (Signed)
OUTPATIENT PHYSICAL THERAPY LOWER EXTREMITY TREATMENT   Patient Name: Jessica [name] MRN: [id number] DOB:April 30, 1951, 72 y.o., female Today's Date: 10/25/2022  END OF SESSION:  PT End of Session - 10/25/22 1432     Visit Number 7    Number of Visits 27    Date for PT Re-Evaluation 12/29/22    Authorization Type [employer]    Progress Note Due on Visit 10    PT Start Time 1430    PT Stop Time 1525    PT Time Calculation (min) 55 min    Activity Tolerance Patient tolerated treatment well;Patient limited by pain;No increased pain    Behavior During Therapy [facility] for tasks assessed/performed               Past Medical History:  Diagnosis Date   Anxiety    Arthritis    bilateral knees   Asthma    childhood, inhaler for wheezing PRN   Autonomic neuropathy    diabetic   Cataract    Celiac disease    Constipation    Depression    Diabetes mellitus    type II, Hemoglobin A1C 9.9 10/05/2011   Diabetic neuropathy (HCC)    Diverticulosis    Fatty liver    Gastroparesis    GERD (gastroesophageal reflux disease)    History of claustrophobia    with MRI tests   Hyperlipidemia    Hypertension    IBS (irritable bowel syndrome)    Kidney stones    Personal history of colonic polyps 03/2010   hyperplastic   PONV (postoperative nausea and vomiting)    Shingles 2021   Past Surgical History:  Procedure Laterality Date   APPENDECTOMY     CATARACT EXTRACTION Right 04/29/2018   CATARACT EXTRACTION Left 03/2018   COLONOSCOPY     DILATATION & CURRETTAGE/HYSTEROSCOPY WITH RESECTOCOPE N/A 03/24/2013   Procedure: DILATATION & CURETTAGE, HYSTEROSCOPY WITH RESECTION;  Surgeon: Levi Aland, MD;  Location: [facility];  Service: Gynecology;  Laterality: N/A;   HYSTEROSCOPY WITH D & C N/A 11/08/2015   Procedure: DILATATION AND CURETTAGE /HYSTEROSCOPY;  Surgeon: Levi Aland, MD;  Location: [facility];  Service: Gynecology;  Laterality: N/A;   left breast cyst removal  2000   LEFT  HEART CATH AND CORONARY ANGIOGRAPHY N/A 10/31/2018   Procedure: LEFT HEART CATH AND CORONARY ANGIOGRAPHY;  Surgeon: Corky Crafts, MD;  Location: [facility] INVASIVE CV LAB;  Service: Cardiovascular;  Laterality: N/A;   TOTAL KNEE ARTHROPLASTY Right 09/12/2022   Procedure: RIGHT TOTAL KNEE ARTHROPLASTY;  Surgeon: Kathryne Hitch, MD;  Location: [facility] OR;  Service: Orthopedics;  Laterality: Right;   TUBAL LIGATION     Patient Active Problem List   Diagnosis Date Noted   Status post total right knee replacement 09/12/2022   Degenerative arthritis of left knee 08/07/2022   Chronic rhinitis 04/18/2022   Gastroesophageal reflux disease 04/18/2022   Vitamin D deficiency 04/13/2022   Morbid obesity (HCC) 04/13/2022   Peripheral sensory neuropathy due to type 2 diabetes mellitus (HCC) 04/05/2020   Bilateral lower extremity edema 01/20/2020   Family history of malignant neoplasm of endometrium 10/08/2018   Celiac disease 02/14/2018   Osteopenia 02/14/2018   Mild intermittent asthma 02/14/2018   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) 02/14/2018   Class 2 obesity due to excess calories with body mass index (BMI) of 37.0 to 37.9 in adult 02/14/2018   Fibroma of tongue 06/06/2016   Renal calculi  01/13/2016   Degenerative cervical disc 07/27/2015   OSA (obstructive sleep apnea) 02/28/2011   Constipation, slow transit 09/23/2010   Type 2 diabetes mellitus with complication, with long-term current use of insulin (HCC) 09/23/2010   FH: colon cancer 09/23/2010   Personal history of colonic polyps 09/23/2010   Combined hyperlipidemia associated with type 2 diabetes mellitus (HCC) 01/29/2009   Diabetic gastroparesis (HCC) 02/27/2007   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 Mardelle Matte, MD   REFERRING PROVIDER: Vanita Panda. Magnus Ivan, MD  REFERRING DIAG: [phone]  (ICD-10-CM) - Status post total right knee replacement   THERAPY DIAG:  Stiffness of right knee, not elsewhere classified  Acute pain of right knee  Muscle weakness (generalized)  Localized edema  Other abnormalities of gait and mobility  Unsteadiness on feet  Rationale for Evaluation and Treatment: Rehabilitation  ONSET DATE: 09/12/2022  SUBJECTIVE:   SUBJECTIVE STATEMENT: She continues to do her exercises.   PERTINENT HISTORY: Right TKA 09/12/2022, DM2, asthma, OA, peripheral neuropathy, osteopenia, shingles  PAIN:  NPRS scale: today 5/10 riding in car, walking 3/10 and since last PT 8/10 at night Pain location: right knee around joint line and in popliteal area.  Pain description: throbbing Aggravating factors: sit in car, positioning like bed Relieving factors: Tylenol, ice  PRECAUTIONS: Fall  WEIGHT BEARING RESTRICTIONS: No  FALLS:  Has patient fallen in last 6 months? No  LIVING ENVIRONMENT: Lives with: lives with their spouse Lives in: [place] with master handicap accessible bath & bedroom ramped entrance. Does not go upstairs.  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   PATIENT SURVEYS:  FOTO intake:  21%  predicted:  47%  COGNITION: Overall cognitive status: WFL    SENSATION: WFL  EDEMA:  Circumferential:  LLE: above knee 51.5cm,  around knee 50.2 cm, below knee 42.5 cm RLE: above knee 55.5 cm,  around knee 53.8 cm, below knee 46.2 cm  POSTURE: rounded shoulders, forward head, flexed trunk , and weight shift left  PALPATION: Right knee tenderness  along joint line & incision.   LOWER EXTREMITY ROM:   ROM Right eval Right 10/13/22 Right 10/16/22 Right 10/25/22  Hip flexion      Hip extension      Hip abduction      Hip adduction      Hip internal rotation      Hip external rotation      Knee flexion Seated A: 64* P: 74* Active 75 AA 84* Seated P: 87* A: 77*  Knee extension Seated A: LAQ -22* Seated with LE extended P: -11* Active -7 Supine A: -5*   Ankle dorsiflexion      Ankle plantarflexion      Ankle inversion      Ankle eversion       (Blank rows = not tested)  LOWER EXTREMITY MMT:  MMT Right eval  Hip flexion   Hip extension   Hip abduction   Hip adduction   Hip internal rotation   Hip external rotation   Knee flexion  3-/5  Knee extension 3-/5  Ankle dorsiflexion   Ankle plantarflexion   Ankle inversion   Ankle eversion    (Blank rows = not tested)  FUNCTIONAL TESTS:  18 inch chair transfer: requires BUE support to arise from 22" w/c to RW for stabilization TUG with RW: 41.75 sec  GAIT: Distance walked: 20' Assistive device utilized: Environmental consultant - 2 wheeled Level of assistance: SBA Comments: Antalgic gait pattern with decreased stance duration right lower extremity, right knee flexed in stance and minimal increase flexion for swing phase and flexed trunk.   TODAY'S TREATMENT                                                                          DATE: 10/25/2022: Therapeutic Exercise: SciFit seat 12 full revolutions  level 1 BLEs/BUEs for 6 min, BLEs only for 2 min Leg press DL 10# 2V25, Rt leg only 36# 2X10 Gastroc stretch step heel depression 30 sec hold 2 reps Heel raises BLEs with RUE support reaching upward with LUE 10 reps Supine knee to chest with ball & strap 10 reps 2 sets Standing LLE TKE green theraband 3 sec hold 10 reps. 1st set only TKE, 2nd set with LLE TKE & RLE heel rise/knee flexion  Manual therapy Contract-relax seated knee flexion.  PROM with gentle overpressure  Vaso  supine elevation with towel roll under heel for knee ext 34* medium compression 10 min   10/24/2022: Therapeutic Exercise: SciFit seat 12 full revolutions BLEs/BUEs level 1 for 8 min Leg press DL 64# [id number], Rt leg only 74# 2X10 Supine knee to chest with ball & strap 10 reps 2 sets  Therapeutic Activities: Sit to / from stand using UEs on seat 10 reps; PT demo & verbal cues on technique as PT assessed arising in waiting area with little use of RLE Pt amb 50' with cane stand alone tip with CGA then SBA.  Manual therapy Contract-relax seated knee flexion.  PROM with gentle overpressure  Vaso supine elevation with towel roll under heel for knee ext 34* medium compression 10 min  10/19/2022: Therapeutic Exercise: Sci fit bike rocking seat #12 X 8 min Leg press DL 25# [id number], Rt leg only 87# 2X10 Seated LAQ alternating with contralateral leg flexion/extension 2X10 Seated knee flexion stretch AAROM 5 sec hold 2X10 Supine quad sets 5 sec hold 2X10  Manual therapy PROM with gentle overpressure to tolerance with 5 sec holds at end range  Vasopnuematic supine elevation  medium compression 10 min to Rt knee   HOME EXERCISE PROGRAM: Access Code: [email or web address] URL: [email or web address] Date: 10/11/2022 Prepared by: Vladimir Faster  Exercises - Ankle Alphabet in Elevation  - 2-4 x daily - 7 x weekly - 1 sets - 1 reps - supine quad set with towel roll under ankle  - 2-4 x daily - 7 x weekly - 2 sets - 10 reps - 5 seconds hold - Supine Heel Slide with Strap  - 2-3 x daily - 7 x weekly - 2-3 sets - 10 reps - 5 seconds hold - Seated Knee Flexion Extension AROM   - 2-4 x daily - 7 x weekly - 2-3 sets - 10 reps - 5 seconds hold - Supine Knee Extension Strengthening  -  2-4 x daily - 7 x weekly - 2-3 sets - 10 reps - 5 seconds hold - Seated Long Arc Quad  - 2-4 x daily - 7 x weekly - 2-3 sets - 10 reps - 5 seconds hold  ASSESSMENT: CLINICAL IMPRESSION: Patient continues to be limited by  knee pain. Her range had slight improvement.  PT added standing exercises to program which she tolerated well.  She will continue to benefit from PT  OBJECTIVE IMPAIRMENTS: Abnormal gait, decreased activity tolerance, decreased balance, decreased endurance, decreased knowledge of condition, decreased knowledge of use of DME, decreased mobility, difficulty walking, decreased ROM, decreased strength, increased edema, postural dysfunction, obesity, and pain.   ACTIVITY LIMITATIONS: carrying, lifting, bending, sitting, standing, squatting, sleeping, stairs, transfers, bed mobility, and locomotion level  PARTICIPATION LIMITATIONS: meal prep, cleaning, driving, community activity, and church  PERSONAL FACTORS: Fitness, Past/current experiences, Time since onset of injury/illness/exacerbation, and 3+ comorbidities: see PMH  are also affecting patient's functional outcome.   REHAB POTENTIAL: Good  CLINICAL DECISION MAKING: Stable/uncomplicated  EVALUATION COMPLEXITY: Low   GOALS: Goals reviewed with patient? Yes  SHORT TERM GOALS: (target date for Short term goals 11/10/2022)   1.  Patient will demonstrate independent use of home exercise program to maintain progress from in clinic treatments. Baseline: see objective data Goal status: Ongoing 10/24/2022  2. PROM right knee ext -5* and flexion 90* Baseline: see objective data Goal status: Ongoing 10/24/2022  3. Interim FOTO >32% Baseline: see objective data  Goal status: Ongoing 10/24/2022  LONG TERM GOALS: (target dates for all long term goals  12/29/2022 )   1. Patient will demonstrate/report pain at worst less than or equal to 2/10 to facilitate minimal limitation in daily activity secondary to pain symptoms. Baseline: see objective data Goal status: Ongoing 10/24/2022   2. Patient will demonstrate independent use of home exercise program to facilitate ability to maintain/progress functional gains from skilled physical therapy  services. Baseline: see objective data Goal status: Ongoing 10/24/2022   3. Patient will demonstrate FOTO outcome > or = 47 % to indicate reduced disability due to condition. Baseline: see objective data Goal status: Ongoing 10/24/2022   4.  Patient will demonstrate right knee MMT 4/5 throughout to faciltiate usual transfers, stairs, squatting at [facility] for daily life.  Baseline: see objective data Goal status: Ongoing 10/24/2022   5.  Patient right knee AROM ext standing -2* and flexion seated or supine 100* Baseline: see objective data Goal status: Ongoing 10/24/2022   6.  patient amb with LRAD >500' and negotiates ramps/curbs/stairs modified independent.  Baseline: see objective data Goal status: Ongoing 10/24/2022   PLAN:  PT FREQUENCY:  2-3x/wk  PT DURATION: 12 weeks  PLANNED INTERVENTIONS: Therapeutic exercises, Therapeutic activity, Neuro Muscular re-education, Balance training, Gait training, Patient/Family education, Joint mobilization, Stair training, DME instructions, Dry Needling, Electrical stimulation, Traction, Cryotherapy, vasopneumatic deviceMoist heat, Taping, Ultrasound, Ionotophoresis 4mg /ml Dexamethasone, and aquatic therapy, Manual therapy.  All included unless contraindicated  PLAN FOR NEXT SESSION:  continue manual therapy & exercise for range, progress standing balance/functional activities, vaso to end for edema.   Vladimir Faster, PT, DPT 10/25/2022, 4:27 PM

## 2022-10-26 NOTE — Therapy (Signed)
OUTPATIENT PHYSICAL THERAPY LOWER EXTREMITY TREATMENT   Patient Name: Jessica [name] MRN: [id number] DOB:1951/02/13, 72 y.o., female Today's Date: 10/27/2022  END OF SESSION:  PT End of Session - 10/27/22 1344     Visit Number 8    Number of Visits 27    Date for PT Re-Evaluation 12/29/22    Authorization Type [employer]    Progress Note Due on Visit 10    PT Start Time 1345    PT Stop Time 1434   vaso end of session   PT Time Calculation (min) 49 min    Activity Tolerance Patient tolerated treatment well;No increased pain    Behavior During Therapy [facility] for tasks assessed/performed                Past Medical History:  Diagnosis Date   Anxiety    Arthritis    bilateral knees   Asthma    childhood, inhaler for wheezing PRN   Autonomic neuropathy    diabetic   Cataract    Celiac disease    Constipation    Depression    Diabetes mellitus    type II, Hemoglobin A1C 9.9 10/05/2011   Diabetic neuropathy (HCC)    Diverticulosis    Fatty liver    Gastroparesis    GERD (gastroesophageal reflux disease)    History of claustrophobia    with MRI tests   Hyperlipidemia    Hypertension    IBS (irritable bowel syndrome)    Kidney stones    Personal history of colonic polyps 03/2010   hyperplastic   PONV (postoperative nausea and vomiting)    Shingles 2021   Past Surgical History:  Procedure Laterality Date   APPENDECTOMY     CATARACT EXTRACTION Right 04/29/2018   CATARACT EXTRACTION Left 03/2018   COLONOSCOPY     DILATATION & CURRETTAGE/HYSTEROSCOPY WITH RESECTOCOPE N/A 03/24/2013   Procedure: DILATATION & CURETTAGE, HYSTEROSCOPY WITH RESECTION;  Surgeon: Levi Aland, MD;  Location: [facility];  Service: Gynecology;  Laterality: N/A;   HYSTEROSCOPY WITH D & C N/A 11/08/2015   Procedure: DILATATION AND CURETTAGE /HYSTEROSCOPY;  Surgeon: Levi Aland, MD;  Location: [facility];  Service: Gynecology;  Laterality: N/A;   left breast cyst removal  2000    LEFT HEART CATH AND CORONARY ANGIOGRAPHY N/A 10/31/2018   Procedure: LEFT HEART CATH AND CORONARY ANGIOGRAPHY;  Surgeon: Corky Crafts, MD;  Location[facility] INVASIVE CV LAB;  Service: Cardiovascular;  Laterality: N/A;   TOTAL KNEE ARTHROPLASTY Right 09/12/2022   Procedure: RIGHT TOTAL KNEE ARTHROPLASTY;  Surgeon: Kathryne Hitch, MD;  Location: [facility] OR;  Service: Orthopedics;  Laterality: Right;   TUBAL LIGATION     Patient Active Problem List   Diagnosis Date Noted   Status post total right knee replacement 09/12/2022   Degenerative arthritis of left knee 08/07/2022   Chronic rhinitis 04/18/2022   Gastroesophageal reflux disease 04/18/2022   Vitamin D deficiency 04/13/2022   Morbid obesity (HCC) 04/13/2022   Peripheral sensory neuropathy due to type 2 diabetes mellitus (HCC) 04/05/2020   Bilateral lower extremity edema 01/20/2020   Family history of malignant neoplasm of endometrium 10/08/2018   Celiac disease 02/14/2018   Osteopenia 02/14/2018   Mild intermittent asthma 02/14/2018   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) 02/14/2018   Class 2 obesity due to excess calories with body mass index (BMI) of 37.0 to 37.9 in adult 02/14/2018   Fibroma of tongue 06/06/2016  Renal calculi 01/13/2016   Degenerative cervical disc 07/27/2015   OSA (obstructive sleep apnea) 02/28/2011   Constipation, slow transit 09/23/2010   Type 2 diabetes mellitus with complication, with long-term current use of insulin (HCC) 09/23/2010   FH: colon cancer 09/23/2010   Personal history of colonic polyps 09/23/2010   Combined hyperlipidemia associated with type 2 diabetes mellitus (HCC) 01/29/2009   Diabetic gastroparesis (HCC) 02/27/2007   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 Mardelle Matte, MD   REFERRING PROVIDER: Vanita Panda. Magnus Ivan, MD  REFERRING DIAG: [phone]  (ICD-10-CM) - Status post total right knee replacement   THERAPY DIAG:  Stiffness of right knee, not elsewhere classified  Acute pain of right knee  Muscle weakness (generalized)  Localized edema  Other abnormalities of gait and mobility  Rationale for Evaluation and Treatment: Rehabilitation  ONSET DATE: 09/12/2022  SUBJECTIVE:   SUBJECTIVE STATEMENT: States she continues to do alright, not too much pain today. 4/10 on arrival   PERTINENT HISTORY: Right TKA 09/12/2022, DM2, asthma, OA, peripheral neuropathy, osteopenia, shingles  PAIN:  NPRS scale: today 5/10 riding in car, walking 3/10 and since last PT 8/10 at night Pain location: right knee around joint line and in popliteal area.  Pain description: throbbing Aggravating factors: sit in car, positioning like bed Relieving factors: Tylenol, ice  PRECAUTIONS: Fall  WEIGHT BEARING RESTRICTIONS: No  FALLS:  Has patient fallen in last 6 months? No  LIVING ENVIRONMENT: Lives with: lives with their spouse Lives in: [place] with master handicap accessible bath & bedroom ramped entrance. Does not go upstairs.  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   PATIENT SURVEYS:  FOTO intake:  21%  predicted:  47%  COGNITION: Overall cognitive status: WFL    SENSATION: WFL  EDEMA:  Circumferential:  LLE: above knee 51.5cm,  around knee 50.2 cm, below knee 42.5 cm RLE: above knee 55.5 cm,  around knee 53.8 cm, below knee 46.2 cm  POSTURE: rounded  shoulders, forward head, flexed trunk , and weight shift left  PALPATION: Right knee tenderness along joint line & incision.   LOWER EXTREMITY ROM:   ROM Right eval Right 10/13/22 Right 10/16/22 Right 10/25/22  Hip flexion      Hip extension      Hip abduction      Hip adduction      Hip internal rotation      Hip external rotation      Knee flexion Seated A: 64* P: 74* Active 75 AA 84* Seated P: 87* A: 77*  Knee extension Seated A: LAQ -22* Seated with LE extended P: -11* Active -7 Supine A: -5*   Ankle dorsiflexion      Ankle plantarflexion      Ankle inversion      Ankle eversion       (Blank rows = not tested)  LOWER EXTREMITY MMT:  MMT Right eval  Hip flexion   Hip extension   Hip abduction  Hip adduction   Hip internal rotation   Hip external rotation   Knee flexion 3-/5  Knee extension 3-/5  Ankle dorsiflexion   Ankle plantarflexion   Ankle inversion   Ankle eversion    (Blank rows = not tested)  FUNCTIONAL TESTS:  18 inch chair transfer: requires BUE support to arise from 22" w/c to RW for stabilization TUG with RW: 41.75 sec  GAIT: Distance walked: 20' Assistive device utilized: Environmental consultant - 2 wheeled Level of assistance: SBA Comments: Antalgic gait pattern with decreased stance duration right lower extremity, right knee flexed in stance and minimal increase flexion for swing phase and flexed trunk.   TODAY'S TREATMENT                                                                           [facility] Adult PT Treatment:                                                DATE: 10/27/22 Therapeutic Exercise: Scifit UE/LE seat at 12, total with final 2 min LE only   Standing mini lunges in // bars 2x8 RLE for improved knee flex, cues for ROM, second set with 4inch step for inc ROM  4 inch step up fwd 2x8 in // bars emphasis on reducing hip/trunk compensations Physioball knee ext isos at full knee extension and 45 deg, 2x8 each superset Quad set with  knee propped for inc extension x12 tactile cues for increased quad Manual Therapy: Seated edge of mat; passive physiological movement into R knee flex/ext, overpressure to pt tolerance, distraction to pt tolerance to improve flexion  Modalities: Supine BIL LE elevated; 10 min vaso low compression 34 deg, tolerates well without adverse event   DATE: 10/25/2022: Therapeutic Exercise: SciFit seat 12 full revolutions  level 1 BLEs/BUEs for 6 min, BLEs only for 2 min Leg press DL 29# [id number], Rt leg only 30# 2X10 Gastroc stretch step heel depression 30 sec hold 2 reps Heel raises BLEs with RUE support reaching upward with LUE 10 reps Supine knee to chest with ball & strap 10 reps 2 sets Standing LLE TKE green theraband 3 sec hold 10 reps. 1st set only TKE, 2nd set with LLE TKE & RLE heel rise/knee flexion  Manual therapy Contract-relax seated knee flexion.  PROM with gentle overpressure  Vaso supine elevation with towel roll under heel for knee ext 34* medium compression 10 min   10/24/2022: Therapeutic Exercise: SciFit seat 12 full revolutions BLEs/BUEs level 1 for 8 min Leg press DL 86# 5H84, Rt leg only 69# 2X10 Supine knee to chest with ball & strap 10 reps 2 sets  Therapeutic Activities: Sit to / from stand using UEs on seat 10 reps; PT demo & verbal cues on technique as PT assessed arising in waiting area with little use of RLE Pt amb 50' with cane stand alone tip with CGA then SBA.  Manual therapy Contract-relax seated knee flexion.  PROM with gentle overpressure  Vaso supine elevation with towel roll under heel for knee ext 34* medium compression 10 min  10/19/2022: Therapeutic Exercise: Sci fit bike rocking seat #12 X 8 min Leg press DL 16# 1W96, Rt leg only 04# 2X10 Seated LAQ alternating with contralateral leg flexion/extension 2X10 Seated knee flexion stretch AAROM 5 sec hold 2X10 Supine quad sets 5 sec hold 2X10  Manual therapy PROM with gentle overpressure to  tolerance with 5 sec holds at end range  Vasopnuematic supine elevation  medium compression 10 min to Rt knee   HOME EXERCISE PROGRAM: Access Code: [email or web address] URL: [email or web address] Date: 10/11/2022 Prepared by: Vladimir Faster  Exercises - Ankle Alphabet in Elevation  - 2-4 x daily - 7 x weekly - 1 sets - 1 reps - supine quad set with towel roll under ankle  - 2-4 x daily - 7 x weekly - 2 sets - 10 reps - 5 seconds hold - Supine Heel Slide with Strap  - 2-3 x daily - 7 x weekly - 2-3 sets - 10 reps - 5 seconds hold - Seated Knee Flexion Extension AROM   - 2-4 x daily - 7 x weekly - 2-3 sets - 10 reps - 5 seconds hold - Supine Knee Extension Strengthening  - 2-4 x daily - 7 x weekly - 2-3 sets - 10 reps - 5 seconds hold - Seated Long Arc Quad  - 2-4 x daily - 7 x weekly - 2-3 sets - 10 reps - 5 seconds hold  ASSESSMENT: CLINICAL IMPRESSION: Pt arrives w/ 4/10 pain, endorses slow/steady improvement. Does well with exercises today emphasizing open and closed chain LE strength/mobility. Gradually reducing stiffness reported throughout, improves notably after manual. Formal measurements deferred today but appears comparable to most recent measurements for ROM. No adverse events, reports no pain on departure and reduced stiffness. Recommend continuing along current POC in order to address relevant deficits and improve functional tolerance. Pt departs today's session in no acute distress, all voiced questions/concerns addressed appropriately from PT perspective.    OBJECTIVE IMPAIRMENTS: Abnormal gait, decreased activity tolerance, decreased balance, decreased endurance, decreased knowledge of condition, decreased knowledge of use of DME, decreased mobility, difficulty walking, decreased ROM, decreased strength, increased edema, postural dysfunction, obesity, and pain.   ACTIVITY LIMITATIONS: carrying, lifting, bending, sitting, standing, squatting, sleeping, stairs, transfers, bed  mobility, and locomotion level  PARTICIPATION LIMITATIONS: meal prep, cleaning, driving, community activity, and church  PERSONAL FACTORS: Fitness, Past/current experiences, Time since onset of injury/illness/exacerbation, and 3+ comorbidities: see PMH  are also affecting patient's functional outcome.   REHAB POTENTIAL: Good  CLINICAL DECISION MAKING: Stable/uncomplicated  EVALUATION COMPLEXITY: Low   GOALS: Goals reviewed with patient? Yes  SHORT TERM GOALS: (target date for Short term goals 11/10/2022)   1.  Patient will demonstrate independent use of home exercise program to maintain progress from in clinic treatments. Baseline: see objective data Goal status: Ongoing 10/24/2022  2. PROM right knee ext -5* and flexion 90* Baseline: see objective data Goal status: Ongoing 10/24/2022  3. Interim FOTO >32% Baseline: see objective data  Goal status: Ongoing 10/24/2022  LONG TERM GOALS: (target dates for all long term goals  12/29/2022 )   1. Patient will demonstrate/report pain at worst less than or equal to 2/10 to facilitate minimal limitation in daily activity secondary to pain symptoms. Baseline: see objective data Goal status: Ongoing 10/24/2022   2. Patient will demonstrate independent use of home exercise program to facilitate ability to maintain/progress functional gains from skilled physical therapy services. Baseline: see objective data Goal status: Ongoing 10/24/2022   3. Patient will demonstrate FOTO  outcome > or = 47 % to indicate reduced disability due to condition. Baseline: see objective data Goal status: Ongoing 10/24/2022   4.  Patient will demonstrate right knee MMT 4/5 throughout to faciltiate usual transfers, stairs, squatting at [facility] for daily life.  Baseline: see objective data Goal status: Ongoing 10/24/2022   5.  Patient right knee AROM ext standing -2* and flexion seated or supine 100* Baseline: see objective data Goal status: Ongoing 10/24/2022   6.   patient amb with LRAD >500' and negotiates ramps/curbs/stairs modified independent.  Baseline: see objective data Goal status: Ongoing 10/24/2022   PLAN:  PT FREQUENCY:  2-3x/wk  PT DURATION: 12 weeks  PLANNED INTERVENTIONS: Therapeutic exercises, Therapeutic activity, Neuro Muscular re-education, Balance training, Gait training, Patient/Family education, Joint mobilization, Stair training, DME instructions, Dry Needling, Electrical stimulation, Traction, Cryotherapy, vasopneumatic deviceMoist heat, Taping, Ultrasound, Ionotophoresis 4mg /ml Dexamethasone, and aquatic therapy, Manual therapy.  All included unless contraindicated  PLAN FOR NEXT SESSION:  continue manual therapy & exercise for range, progress standing balance/functional activities, vaso to end for edema.     Ashley Murrain PT, DPT 10/27/2022 4:25 PM

## 2022-10-27 ENCOUNTER — Ambulatory Visit (INDEPENDENT_AMBULATORY_CARE_PROVIDER_SITE_OTHER): Payer: Medicare Other | Admitting: Physical Therapy

## 2022-10-27 ENCOUNTER — Encounter: Payer: Self-pay | Admitting: Physical Therapy

## 2022-10-27 DIAGNOSIS — M25661 Stiffness of right knee, not elsewhere classified: Secondary | ICD-10-CM | POA: Diagnosis not present

## 2022-10-27 DIAGNOSIS — M6281 Muscle weakness (generalized): Secondary | ICD-10-CM

## 2022-10-27 DIAGNOSIS — M25561 Pain in right knee: Secondary | ICD-10-CM | POA: Diagnosis not present

## 2022-10-27 DIAGNOSIS — R600 Localized edema: Secondary | ICD-10-CM

## 2022-10-27 DIAGNOSIS — R2689 Other abnormalities of gait and mobility: Secondary | ICD-10-CM

## 2022-10-30 ENCOUNTER — Encounter: Payer: Self-pay | Admitting: Physical Therapy

## 2022-10-30 ENCOUNTER — Ambulatory Visit (INDEPENDENT_AMBULATORY_CARE_PROVIDER_SITE_OTHER): Payer: Medicare Other | Admitting: Physical Therapy

## 2022-10-30 DIAGNOSIS — R600 Localized edema: Secondary | ICD-10-CM

## 2022-10-30 DIAGNOSIS — R2689 Other abnormalities of gait and mobility: Secondary | ICD-10-CM

## 2022-10-30 DIAGNOSIS — M25561 Pain in right knee: Secondary | ICD-10-CM

## 2022-10-30 DIAGNOSIS — M25661 Stiffness of right knee, not elsewhere classified: Secondary | ICD-10-CM | POA: Diagnosis not present

## 2022-10-30 DIAGNOSIS — R2681 Unsteadiness on feet: Secondary | ICD-10-CM

## 2022-10-30 DIAGNOSIS — M6281 Muscle weakness (generalized): Secondary | ICD-10-CM | POA: Diagnosis not present

## 2022-10-30 NOTE — Therapy (Signed)
OUTPATIENT PHYSICAL THERAPY LOWER EXTREMITY TREATMENT   Patient Name: Hensleigh Mermelstein Yust MRN: [id number] DOB:1950-12-15, 72 y.o., female Today's Date: 10/30/2022  END OF SESSION:  PT End of Session - 10/30/22 1433     Visit Number 9    Number of Visits 27    Date for PT Re-Evaluation 12/29/22    Authorization Type [employer] [facility]    Progress Note Due on Visit 10    PT Start Time 1432    PT Stop Time 1525    PT Time Calculation (min) 53 min    Activity Tolerance Patient tolerated treatment well;No increased pain    Behavior During Therapy [facility] for tasks assessed/performed                 Past Medical History:  Diagnosis Date   Anxiety    Arthritis    bilateral knees   Asthma    childhood, inhaler for wheezing PRN   Autonomic neuropathy    diabetic   Cataract    Celiac disease    Constipation    Depression    Diabetes mellitus    type II, Hemoglobin A1C 9.9 10/05/2011   Diabetic neuropathy (HCC)    Diverticulosis    Fatty liver    Gastroparesis    GERD (gastroesophageal reflux disease)    History of claustrophobia    with MRI tests   Hyperlipidemia    Hypertension    IBS (irritable bowel syndrome)    Kidney stones    Personal history of colonic polyps 03/2010   hyperplastic   PONV (postoperative nausea and vomiting)    Shingles 2021   Past Surgical History:  Procedure Laterality Date   APPENDECTOMY     CATARACT EXTRACTION Right 04/29/2018   CATARACT EXTRACTION Left 03/2018   COLONOSCOPY     DILATATION & CURRETTAGE/HYSTEROSCOPY WITH RESECTOCOPE N/A 03/24/2013   Procedure: DILATATION & CURETTAGE, HYSTEROSCOPY WITH RESECTION;  Surgeon: Levi Aland, MD;  Location: [facility];  Service: Gynecology;  Laterality: N/A;   HYSTEROSCOPY WITH D & C N/A 11/08/2015   Procedure: DILATATION AND CURETTAGE /HYSTEROSCOPY;  Surgeon: Levi Aland, MD;  Location: [facility];  Service: Gynecology;  Laterality: N/A;   left breast cyst removal  2000   LEFT HEART CATH AND CORONARY  ANGIOGRAPHY N/A 10/31/2018   Procedure: LEFT HEART CATH AND CORONARY ANGIOGRAPHY;  Surgeon: Corky Crafts, MD;  Location: [facility] IN[facility]IVE CV [facility];  Service: Cardiovascular;  Laterality: N/A;   TOTAL KNEE ARTHROPLASTY Right 09/12/2022   Procedure: RIGHT TOTAL KNEE ARTHROPLASTY;  Surgeon: Kathryne Hitch, MD;  Location: [facility] OR;  Service: Orthopedics;  Laterality: Right;   TUBAL LIGATION     Patient Active Problem List   Diagnosis Date Noted   Status post total right knee replacement 09/12/2022   Degenerative arthritis of left knee 08/07/2022   Chronic rhinitis 04/18/2022   Gastroesophageal reflux disease 04/18/2022   Vitamin D deficiency 04/13/2022   Morbid obesity (HCC) 04/13/2022   Peripheral sensory neuropathy due to type 2 diabetes mellitus (HCC) 04/05/2020   Bilateral lower extremity edema 01/20/2020   Family history of malignant neoplasm of endometrium 10/08/2018   Celiac disease 02/14/2018   Osteopenia 02/14/2018   Mild intermittent asthma 02/14/2018   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) 02/14/2018   Class 2 obesity due to excess calories with body mass index (BMI) of 37.0 to 37.9 in adult 02/14/2018   Fibroma of tongue 06/06/2016   Renal calculi 01/13/2016  Degenerative cervical disc 07/27/2015   OSA (obstructive sleep apnea) 02/28/2011   Constipation, slow transit 09/23/2010   Type 2 diabetes mellitus with complication, with long-term current use of insulin (HCC) 09/23/2010   FH: colon cancer 09/23/2010   Personal history of colonic polyps 09/23/2010   Combined hyperlipidemia associated with type 2 diabetes mellitus (HCC) 01/29/2009   Diabetic gastroparesis (HCC) 02/27/2007   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 Mardelle Matte, MD   REFERRING PROVIDER: Vanita Panda. Magnus Ivan, MD  REFERRING DIAG: [phone] (ICD-10-CM) - Status post total  right knee replacement   THERAPY DIAG:  Stiffness of right knee, not elsewhere classified  Acute pain of right knee  Muscle weakness (generalized)  Localized edema  Other abnormalities of gait and mobility  Unsteadiness on feet  Rationale for Evaluation and Treatment: Rehabilitation  ONSET DATE: 09/12/2022  SUBJECTIVE:   SUBJECTIVE STATEMENT: She went to [employer] and walked for 15 min without issues.  She continues to do her exercises and elevates her legs.   PERTINENT HISTORY: Right TKA 09/12/2022, DM2, asthma, OA, peripheral neuropathy, osteopenia, shingles  PAIN:  NPRS scale: today 5/10 riding in car, walking 2/10 and since last PT 8/10 at night Pain location: right knee around joint line and in popliteal area.  Pain description: throbbing Aggravating factors: sit in car, positioning like bed Relieving factors: Tylenol, ice  PRECAUTIONS: Fall  WEIGHT BEARING RESTRICTIONS: No  FALLS:  Has patient fallen in last 6 months? No  LIVING ENVIRONMENT: Lives with: lives with their spouse Lives in: [place] with master handicap accessible bath & bedroom ramped entrance. Does not go upstairs.  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   PATIENT SURVEYS:  FOTO intake:  21%  predicted:  47%  COGNITION: Overall cognitive status: WFL    SENSATION: WFL  EDEMA:  Circumferential:  LLE: above knee 51.5cm,  around knee 50.2 cm, below knee 42.5 cm RLE: above knee 55.5 cm,  around knee 53.8 cm, below knee  46.2 cm  POSTURE: rounded shoulders, forward head, flexed trunk , and weight shift left  PALPATION: Right knee tenderness along joint line & incision.   LOWER EXTREMITY ROM:   ROM Right eval Right 10/13/22 Right 10/16/22 Right 10/25/22  Hip flexion      Hip extension      Hip abduction      Hip adduction      Hip internal rotation      Hip external rotation      Knee flexion Seated A: 64* P: 74* Active 75 AA 84* Seated P: 87* A: 77*  Knee extension Seated A: LAQ -22* Seated with LE extended P: -11* Active -7 Supine A: -5*   Ankle dorsiflexion      Ankle plantarflexion      Ankle inversion      Ankle eversion       (Blank rows = not tested)  LOWER EXTREMITY MMT:  MMT Right eval  Hip flexion  Hip extension   Hip abduction   Hip adduction   Hip internal rotation   Hip external rotation   Knee flexion 3-/5  Knee extension 3-/5  Ankle dorsiflexion   Ankle plantarflexion   Ankle inversion   Ankle eversion    (Blank rows = not tested)  FUNCTIONAL TESTS:  18 inch chair transfer: requires BUE support to arise from 22" w/c to RW for stabilization TUG with RW: 41.75 sec  GAIT: Distance walked: 20' Assistive device utilized: Environmental consultant - 2 wheeled Level of assistance: SBA Comments: Antalgic gait pattern with decreased stance duration right lower extremity, right knee flexed in stance and minimal increase flexion for swing phase and flexed trunk.   TODAY'S TREATMENT                                                                           [facility] Adult PT Treatment:                                                DATE: 10/27/22 Therapeutic Exercise: Scifit UE/LE seat at 12, total with final 2 min LE only   Standing mini lunges in // bars 2x8 RLE for improved knee flex, cues for ROM, second set with 4inch step for inc ROM  4 inch step up fwd 2x8 in // bars emphasis on reducing hip/trunk compensations Physioball knee ext isos at full knee extension and 45 deg, 2x8 each  superset Quad set with knee propped for inc extension x12 tactile cues for increased quad Manual Therapy: Seated edge of mat; passive physiological movement into R knee flex/ext, overpressure to pt tolerance, distraction to pt tolerance to improve flexion  Modalities: Supine BIL LE elevated; 10 min vaso low compression 34 deg, tolerates well without adverse event   DATE: 10/30/2022: Therapeutic Exercise: Leg press DL 16# [id number], Rt leg only 04# 2X10 Tandem stance on foam with RLE in front & in back 30 sec ea with intermittent touch Gastroc stretch step heel depression 30 sec hold 2 reps Heel raises BLEs with RUE support reaching upward with LUE 15 reps Supine knee to chest with ball & strap 10 reps 2 sets Standing LLE TKE green theraband 3 sec hold 10 reps. 1st set only TKE, 2nd set with LLE TKE & RLE heel rise/knee flexion  Self-care: PT instructed with demo & verbal cues in ice massage. Pt verbalized & return demo understanding.   Manual therapy Contract-relax seated knee flexion.  PROM with gentle overpressure  Vaso supine elevation with towel roll under heel for knee ext 34* medium compression 10 min  10/25/2022: Therapeutic Exercise: SciFit seat 12 full revolutions  level 1 BLEs/BUEs for 6 min, BLEs only for 2 min Leg press DL 54# [id number], Rt leg only 19# 2X10 Gastroc stretch step heel depression 30 sec hold 2 reps Heel raises BLEs with RUE support reaching upward with LUE 10 reps Supine knee to chest with ball & strap 10 reps 2 sets Standing LLE TKE green theraband 3 sec hold 10 reps. 1st set only TKE, 2nd set with LLE TKE &  RLE heel rise/knee flexion  Manual therapy Contract-relax seated knee flexion.  PROM with gentle overpressure  Vaso supine elevation with towel roll under heel for knee ext 34* medium compression 10 min   10/24/2022: Therapeutic Exercise: SciFit seat 12 full revolutions BLEs/BUEs level 1 for 8 min Leg press DL 78# 2N56, Rt leg only 21# 2X10 Supine  knee to chest with ball & strap 10 reps 2 sets  Therapeutic Activities: Sit to / from stand using UEs on seat 10 reps; PT demo & verbal cues on technique as PT assessed arising in waiting area with little use of RLE Pt amb 50' with cane stand alone tip with CGA then SBA.  Manual therapy Contract-relax seated knee flexion.  PROM with gentle overpressure  Vaso supine elevation with towel roll under heel for knee ext 34* medium compression 10 min    HOME EXERCISE PROGRAM: Access Code: [email or web address] URL: [email or web address] Date: 10/11/2022 Prepared by: Vladimir Faster  Exercises - Ankle Alphabet in Elevation  - 2-4 x daily - 7 x weekly - 1 sets - 1 reps - supine quad set with towel roll under ankle  - 2-4 x daily - 7 x weekly - 2 sets - 10 reps - 5 seconds hold - Supine Heel Slide with Strap  - 2-3 x daily - 7 x weekly - 2-3 sets - 10 reps - 5 seconds hold - Seated Knee Flexion Extension AROM   - 2-4 x daily - 7 x weekly - 2-3 sets - 10 reps - 5 seconds hold - Supine Knee Extension Strengthening  - 2-4 x daily - 7 x weekly - 2-3 sets - 10 reps - 5 seconds hold - Seated Long Arc Quad  - 2-4 x daily - 7 x weekly - 2-3 sets - 10 reps - 5 seconds hold  ASSESSMENT: CLINICAL IMPRESSION: PT was able to progress exercises to include more functional standing activities which she tolerated well.  PT instructed in ice massage which patient understands and feels will help with pain.    OBJECTIVE IMPAIRMENTS: Abnormal gait, decreased activity tolerance, decreased balance, decreased endurance, decreased knowledge of condition, decreased knowledge of use of DME, decreased mobility, difficulty walking, decreased ROM, decreased strength, increased edema, postural dysfunction, obesity, and pain.   ACTIVITY LIMITATIONS: carrying, lifting, bending, sitting, standing, squatting, sleeping, stairs, transfers, bed mobility, and locomotion level  PARTICIPATION LIMITATIONS: meal prep, cleaning,  driving, community activity, and church  PERSONAL FACTORS: Fitness, Past/current experiences, Time since onset of injury/illness/exacerbation, and 3+ comorbidities: see PMH  are also affecting patient's functional outcome.   REHAB POTENTIAL: Good  CLINICAL DECISION MAKING: Stable/uncomplicated  EVALUATION COMPLEXITY: Low   GOALS: Goals reviewed with patient? Yes  SHORT TERM GOALS: (target date for Short term goals 11/10/2022)   1.  Patient will demonstrate independent use of home exercise program to maintain progress from in clinic treatments. Baseline: see objective data Goal status: Ongoing 10/24/2022  2. PROM right knee ext -5* and flexion 90* Baseline: see objective data Goal status: Ongoing 10/24/2022  3. Interim FOTO >32% Baseline: see objective data  Goal status: Ongoing 10/24/2022  LONG TERM GOALS: (target dates for all long term goals  12/29/2022 )   1. Patient will demonstrate/report pain at worst less than or equal to 2/10 to facilitate minimal limitation in daily activity secondary to pain symptoms. Baseline: see objective data Goal status: Ongoing 10/24/2022   2. Patient will demonstrate independent use of home exercise program to facilitate ability to maintain/progress functional gains from  skilled physical therapy services. Baseline: see objective data Goal status: Ongoing 10/24/2022   3. Patient will demonstrate FOTO outcome > or = 47 % to indicate reduced disability due to condition. Baseline: see objective data Goal status: Ongoing 10/24/2022   4.  Patient will demonstrate right knee MMT 4/5 throughout to faciltiate usual transfers, stairs, squatting at [facility] for daily life.  Baseline: see objective data Goal status: Ongoing 10/24/2022   5.  Patient right knee AROM ext standing -2* and flexion seated or supine 100* Baseline: see objective data Goal status: Ongoing 10/24/2022   6.  patient amb with LRAD >500' and negotiates ramps/curbs/stairs modified  independent.  Baseline: see objective data Goal status: Ongoing 10/24/2022   PLAN:  PT FREQUENCY:  2-3x/wk  PT DURATION: 12 weeks  PLANNED INTERVENTIONS: Therapeutic exercises, Therapeutic activity, Neuro Muscular re-education, Balance training, Gait training, Patient/Family education, Joint mobilization, Stair training, DME instructions, Dry Needling, Electrical stimulation, Traction, Cryotherapy, vasopneumatic deviceMoist heat, Taping, Ultrasound, Ionotophoresis 4mg /ml Dexamethasone, and aquatic therapy, Manual therapy.  All included unless contraindicated  PLAN FOR NEXT SESSION:  do 10th visit note including FOTO & ROM, continue manual therapy & exercise for range, progressing standing balance/functional activities, vaso to end for edema.      Vladimir Faster, PT, DPT 10/30/2022, 4:16 PM

## 2022-10-31 ENCOUNTER — Encounter: Payer: Self-pay | Admitting: Physical Therapy

## 2022-10-31 ENCOUNTER — Ambulatory Visit (INDEPENDENT_AMBULATORY_CARE_PROVIDER_SITE_OTHER): Payer: Medicare Other | Admitting: Physical Therapy

## 2022-10-31 DIAGNOSIS — R2681 Unsteadiness on feet: Secondary | ICD-10-CM

## 2022-10-31 DIAGNOSIS — M25661 Stiffness of right knee, not elsewhere classified: Secondary | ICD-10-CM

## 2022-10-31 DIAGNOSIS — M6281 Muscle weakness (generalized): Secondary | ICD-10-CM | POA: Diagnosis not present

## 2022-10-31 DIAGNOSIS — R600 Localized edema: Secondary | ICD-10-CM

## 2022-10-31 DIAGNOSIS — R2689 Other abnormalities of gait and mobility: Secondary | ICD-10-CM

## 2022-10-31 DIAGNOSIS — M25561 Pain in right knee: Secondary | ICD-10-CM | POA: Diagnosis not present

## 2022-10-31 NOTE — Therapy (Signed)
OUTPATIENT PHYSICAL THERAPY LOWER EXTREMITY TREATMENT & PROGRESS NOTE   Patient Name: Jessica [name] MRN: [id number] DOB:[birth year], 72 y.o., female Today's Date: 10/31/2022  Progress Note Reporting Period 10/11/2022 to 10/31/2022  See note below for Objective Data and Assessment of Progress/Goals.      END OF SESSION:  PT End of Session - 10/31/22 1343     Visit Number 10    Number of Visits 27    Date for PT Re-Evaluation 12/29/22    Authorization Type [employer] [facility]    Progress Note Due on Visit 10    PT Start Time 1342    PT Stop Time 1440    PT Time Calculation (min) 58 min    Activity Tolerance Patient tolerated treatment well;No increased pain    Behavior During Therapy [facility] for tasks assessed/performed                 Past Medical History:  Diagnosis Date   Anxiety    Arthritis    bilateral knees   Asthma    childhood, inhaler for wheezing PRN   Autonomic neuropathy    diabetic   Cataract    Celiac disease    Constipation    Depression    Diabetes mellitus    type II, Hemoglobin A1C 9.9 10/05/2011   Diabetic neuropathy (HCC)    Diverticulosis    Fatty liver    Gastroparesis    GERD (gastroesophageal reflux disease)    History of claustrophobia    with MRI tests   Hyperlipidemia    Hypertension    IBS (irritable bowel syndrome)    Kidney stones    Personal history of colonic polyps 03/2010   hyperplastic   PONV (postoperative nausea and vomiting)    Shingles 2021   Past Surgical History:  Procedure Laterality Date   APPENDECTOMY     CATARACT EXTRACTION Right 04/29/2018   CATARACT EXTRACTION Left 03/2018   COLONOSCOPY     DILATATION & CURRETTAGE/HYSTEROSCOPY WITH RESECTOCOPE N/A 03/24/2013   Procedure: DILATATION & CURETTAGE, HYSTEROSCOPY WITH RESECTION;  Surgeon: Levi Aland, MD;  Location: [facility];  Service: Gynecology;  Laterality: N/A;   HYSTEROSCOPY WITH D & C N/A 11/08/2015   Procedure: DILATATION AND CURETTAGE /HYSTEROSCOPY;   Surgeon: Levi Aland, MD;  Location: [facility];  Service: Gynecology;  Laterality: N/A;   left breast cyst removal  2000   LEFT HEART CATH AND CORONARY ANGIOGRAPHY N/A 10/31/2018   Procedure: LEFT HEART CATH AND CORONARY ANGIOGRAPHY;  Surgeon: Corky Crafts, MD;  Location: [facility] INVASIVE CV LAB;  Service: Cardiovascular;  Laterality: N/A;   TOTAL KNEE ARTHROPLASTY Right 09/12/2022   Procedure: RIGHT TOTAL KNEE ARTHROPLASTY;  Surgeon: Kathryne Hitch, MD;  Location: [facility];  Service: Orthopedics;  Laterality: Right;   TUBAL LIGATION     Patient Active Problem List   Diagnosis Date Noted   Status post total right knee replacement 09/12/2022   Degenerative arthritis of left knee 08/07/2022   Chronic rhinitis 04/18/2022   Gastroesophageal reflux disease 04/18/2022   Vitamin D deficiency 04/13/2022   Morbid obesity (HCC) 04/13/2022   Peripheral sensory neuropathy due to type 2 diabetes mellitus (HCC) 04/05/2020   Bilateral lower extremity edema 01/20/2020   Family history of malignant neoplasm of endometrium 10/08/2018   Celiac disease 02/14/2018   Osteopenia 02/14/2018   Mild intermittent asthma 02/14/2018   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) 02/14/2018   Class 2 obesity due  to excess calories with body mass index (BMI) of 37.0 to 37.9 in adult 02/14/2018   Fibroma of tongue 06/06/2016   Renal calculi 01/13/2016   Degenerative cervical disc 07/27/2015   OSA (obstructive sleep apnea) 02/28/2011   Constipation, slow transit 09/23/2010   Type 2 diabetes mellitus with complication, with long-term current use of insulin (HCC) 09/23/2010   FH: colon cancer 09/23/2010   Personal history of colonic polyps 09/23/2010   Combined hyperlipidemia associated with type 2 diabetes mellitus (HCC) 01/29/2009   Diabetic gastroparesis (HCC) 02/27/2007   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) 10/22/2006   Essential hypertension  10/22/2006    PCP: Durward Mallard L. Mardelle Matte, MD   REFERRING PROVIDER: Vanita Panda. Magnus Ivan, MD  REFERRING DIAG: [phone] (ICD-10-CM) - Status post total right knee replacement   THERAPY DIAG:  Stiffness of right knee, not elsewhere classified  Acute pain of right knee  Muscle weakness (generalized)  Localized edema  Other abnormalities of gait and mobility  Unsteadiness on feet  Rationale for Evaluation and Treatment: Rehabilitation  ONSET DATE: 09/12/2022  SUBJECTIVE:   SUBJECTIVE STATEMENT: She feels that she is doing much better. Patient reports the ice massage is helping.   PERTINENT HISTORY: Right TKA 09/12/2022, DM2, asthma, OA, peripheral neuropathy, osteopenia, shingles  PAIN:  NPRS scale: today 4/10 riding in car, walking 0/10 and since last PT 7/10 at night Pain location: right knee around joint line and in popliteal area.  Pain description: throbbing Aggravating factors: sit in car, positioning like bed Relieving factors: Tylenol, ice  PRECAUTIONS: Fall  WEIGHT BEARING RESTRICTIONS: No  FALLS:  Has patient fallen in last 6 months? No  LIVING ENVIRONMENT: Lives with: lives with their spouse Lives in: [place] with master handicap accessible bath & bedroom ramped entrance. Does not go upstairs.  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   PATIENT SURVEYS:  10/31/2022: FOTO visit 10 52% Eval: FOTO intake:  21%  predicted:  47%  COGNITION: Overall cognitive status:  WFL    SENSATION: WFL  EDEMA:  Circumferential:  LLE: above knee 51.5cm,  around knee 50.2 cm, below knee 42.5 cm RLE: above knee 55.5 cm,  around knee 53.8 cm, below knee 46.2 cm  POSTURE: rounded shoulders, forward head, flexed trunk , and weight shift left  PALPATION: Right knee tenderness along joint line & incision.   LOWER EXTREMITY ROM:   ROM Right eval Right 10/13/22 Right 10/16/22 Right 10/25/22 Right 10/31/22  Hip flexion       Hip extension       Hip abduction       Hip adduction       Hip internal rotation       Hip external rotation       Knee flexion Seated A: 64* P: 74* Active 75 AA 84* Seated P: 87* A: 77* Seated P: 90* A:83*  Knee extension Seated A: LAQ -22* Seated with LE extended P: -11* Active -7 Supine A: -5*  Supine P: -1* A: quad set -3* SAQ -6*  Ankle dorsiflexion       Ankle plantarflexion       Ankle inversion       Ankle eversion        (Blank rows = not tested)  LOWER EXTREMITY MMT:  MMT Right eval  Hip flexion   Hip extension   Hip abduction   Hip adduction   Hip internal rotation   Hip external rotation   Knee flexion 3-/5  Knee extension 3-/5  Ankle dorsiflexion   Ankle plantarflexion   Ankle inversion   Ankle eversion    (Blank rows = not tested)  FUNCTIONAL TESTS:  18 inch chair transfer: requires BUE support to arise from 22" w/c to RW for stabilization TUG with RW: 41.75 sec  GAIT: Distance walked: 20' Assistive device utilized: Environmental consultant - 2 wheeled Level of assistance: SBA Comments: Antalgic gait pattern with decreased stance duration right lower extremity, right knee flexed in stance and minimal increase flexion for swing phase and flexed trunk.   TODAY'S TREATMENT                                 DATE: 10/31/2022: Therapeutic Exercise: SciFit seat 12 full revolutions  level 1 BLEs/BUEs for 6 min, BLEs only for 2 min Leg press DL 16# [id number], Rt leg only 04# 2X10 Tandem stance on foam with RLE in front & in  back 30 sec ea with intermittent touch Gastroc stretch step heel depression 30 sec hold 2 reps Heel raises BLEs with RUE support reaching upward with LUE 15 reps Supine knee to chest with ball & strap 10 reps 2 sets Standing LLE TKE green theraband 3 sec hold 10 reps. 1st set only TKE, 2nd set with LLE TKE & RLE heel rise/knee flexion Supine Quad set with towel roll 5 sec hold 10 reps Supine SAQ 10 reps  Gait Training:  Pt amb 30' with cane with CGA & cues on sequencing.  Manual therapy Contract-relax seated knee flexion.  PROM with gentle overpressure Supine knee ext grade III mob.  Vaso supine elevation with towel roll under heel for knee ext 34* medium compression 10 min    10/30/2022: Therapeutic Exercise: Leg press DL 54# [id number], Rt leg only 19# 2X10 Tandem stance on foam with RLE in front & in back 30 sec ea with intermittent touch Gastroc stretch step heel depression 30 sec hold 2 reps Heel raises BLEs with RUE support reaching upward with LUE 15 reps Supine knee to chest with ball & strap 10 reps 2 sets Standing LLE TKE green theraband 3 sec hold 10 reps. 1st set only TKE, 2nd set with LLE TKE & RLE heel rise/knee flexion  Self-care: PT instructed with demo & verbal cues in ice massage. Pt verbalized & return demo understanding.   Manual therapy Contract-relax seated knee flexion.  PROM with gentle overpressure  Vaso supine elevation with towel roll under heel for knee ext 34* medium compression 10 min  Treatment:                                                DATE: 10/27/22 Therapeutic Exercise: Scifit UE/LE seat at 12, total with final 2 min LE only   Standing mini lunges in // bars 2x8 RLE for improved knee flex, cues for ROM,  second set with 4inch step for inc ROM  4 inch step up fwd 2x8 in // bars emphasis on reducing hip/trunk compensations Physioball knee ext isos at full knee extension and 45 deg, 2x8 each superset Quad set with knee propped for inc  extension x12 tactile cues for increased quad Manual Therapy: Seated edge of mat; passive physiological movement into R knee flex/ext, overpressure to pt tolerance, distraction to pt tolerance to improve flexion  Modalities: Supine BIL LE elevated; 10 min vaso low compression 34 deg, tolerates well without adverse event   10/25/2022: Therapeutic Exercise: SciFit seat 12 full revolutions  level 1 BLEs/BUEs for 6 min, BLEs only for 2 min Leg press DL 02# [id number], Rt leg only 66# 2X10 Gastroc stretch step heel depression 30 sec hold 2 reps Heel raises BLEs with RUE support reaching upward with LUE 10 reps Supine knee to chest with ball & strap 10 reps 2 sets Standing LLE TKE green theraband 3 sec hold 10 reps. 1st set only TKE, 2nd set with LLE TKE & RLE heel rise/knee flexion  Manual therapy Contract-relax seated knee flexion.  PROM with gentle overpressure  Vaso supine elevation with towel roll under heel for knee ext 34* medium compression 10 min      HOME EXERCISE PROGRAM: Access Code: [email or web address] URL: [email or web address] Date: 10/11/2022 Prepared by: Vladimir Faster  Exercises - Ankle Alphabet in Elevation  - 2-4 x daily - 7 x weekly - 1 sets - 1 reps - supine quad set with towel roll under ankle  - 2-4 x daily - 7 x weekly - 2 sets - 10 reps - 5 seconds hold - Supine Heel Slide with Strap  - 2-3 x daily - 7 x weekly - 2-3 sets - 10 reps - 5 seconds hold - Seated Knee Flexion Extension AROM   - 2-4 x daily - 7 x weekly - 2-3 sets - 10 reps - 5 seconds hold - Supine Knee Extension Strengthening  - 2-4 x daily - 7 x weekly - 2-3 sets - 10 reps - 5 seconds hold - Seated Long Arc Quad  - 2-4 x daily - 7 x weekly - 2-3 sets - 10 reps - 5 seconds hold  ASSESSMENT: CLINICAL IMPRESSION: Patient has seen PT 10 visits and has made significant progress. She continues to struggle with pain and edema but manual therapy & exercise are able to improve her motion / function.  Patient met all STGs.  Patient continues to benefit from skilled PT.   OBJECTIVE IMPAIRMENTS: Abnormal gait, decreased activity tolerance, decreased balance, decreased endurance, decreased knowledge of condition, decreased knowledge of use of DME, decreased mobility, difficulty walking, decreased ROM, decreased strength, increased edema, postural dysfunction, obesity, and pain.   ACTIVITY LIMITATIONS: carrying, lifting, bending, sitting, standing, squatting, sleeping, stairs, transfers, bed mobility, and locomotion level  PARTICIPATION LIMITATIONS: meal prep, cleaning, driving, community activity, and church  PERSONAL FACTORS: Fitness, Past/current experiences, Time since onset of injury/illness/exacerbation, and 3+ comorbidities: see PMH  are also affecting patient's functional outcome.   REHAB POTENTIAL: Good  CLINICAL DECISION MAKING: Stable/uncomplicated  EVALUATION COMPLEXITY: Low   GOALS: Goals reviewed with patient? Yes  SHORT TERM GOALS: (target date for Short term goals 11/10/2022)   1.  Patient will demonstrate independent use of home exercise program to maintain progress from in clinic treatments. Baseline: see objective data Goal status:  MET 10/31/2022  2. PROM right knee ext -5* and flexion 90* Baseline: see objective data Goal status: MET 10/31/2022  3. Interim FOTO >32% Baseline: see objective data  Goal status:  MET 10/31/2022  LONG TERM GOALS: (target dates for all long term goals  12/29/2022 )   1. Patient will demonstrate/report pain at worst less than or equal to 2/10 to facilitate minimal limitation in daily activity secondary to pain symptoms. Baseline: see objective data Goal status: Ongoing 10/24/2022   2. Patient will demonstrate independent use of home exercise program to facilitate ability to maintain/progress functional gains from skilled physical therapy services. Baseline: see objective data Goal status: Ongoing 10/24/2022   3. Patient will demonstrate  FOTO outcome > or = 47 % to indicate reduced disability due to condition. Baseline: see objective data Goal status:  MET 10/31/2022   4.  Patient will demonstrate right knee MMT 4/5 throughout to faciltiate usual transfers, stairs, squatting at [facility] for daily life.  Baseline: see objective data Goal status: Ongoing 10/24/2022   5.  Patient right knee AROM ext standing -2* and flexion seated or supine 100* Baseline: see objective data Goal status: Ongoing 10/24/2022   6.  patient amb with LRAD >500' and negotiates ramps/curbs/stairs modified independent.  Baseline: see objective data Goal status: Ongoing 10/24/2022   PLAN:  PT FREQUENCY:  2-3x/wk  PT DURATION: 12 weeks  PLANNED INTERVENTIONS: Therapeutic exercises, Therapeutic activity, Neuro Muscular re-education, Balance training, Gait training, Patient/Family education, Joint mobilization, Stair training, DME instructions, Dry Needling, Electrical stimulation, Traction, Cryotherapy, vasopneumatic deviceMoist heat, Taping, Ultrasound, Ionotophoresis 4mg /ml Dexamethasone, and aquatic therapy, Manual therapy.  All included unless contraindicated  PLAN FOR NEXT SESSION:  gait with cane, continue manual therapy & exercise for range, progressing standing balance/functional activities, vaso to end for edema.      Vladimir Faster, PT, DPT 10/31/2022, 4:15 PM

## 2022-11-01 ENCOUNTER — Ambulatory Visit (INDEPENDENT_AMBULATORY_CARE_PROVIDER_SITE_OTHER): Payer: Medicare Other | Admitting: Physical Therapy

## 2022-11-01 ENCOUNTER — Encounter: Payer: Self-pay | Admitting: Physical Therapy

## 2022-11-01 DIAGNOSIS — M25561 Pain in right knee: Secondary | ICD-10-CM | POA: Diagnosis not present

## 2022-11-01 DIAGNOSIS — M25661 Stiffness of right knee, not elsewhere classified: Secondary | ICD-10-CM

## 2022-11-01 DIAGNOSIS — M6281 Muscle weakness (generalized): Secondary | ICD-10-CM

## 2022-11-01 DIAGNOSIS — R2681 Unsteadiness on feet: Secondary | ICD-10-CM

## 2022-11-01 DIAGNOSIS — R2689 Other abnormalities of gait and mobility: Secondary | ICD-10-CM

## 2022-11-01 DIAGNOSIS — R600 Localized edema: Secondary | ICD-10-CM | POA: Diagnosis not present

## 2022-11-01 NOTE — Therapy (Signed)
OUTPATIENT PHYSICAL THERAPY LOWER EXTREMITY TREATMENT   Patient Name: Jessica [name] MRN: [id number] DOB:11-Nov-1950, 72 y.o., female Today's Date: 11/01/2022   END OF SESSION:  PT End of Session - 11/01/22 1436     Visit Number 11    Number of Visits 27    Date for PT Re-Evaluation 12/29/22    Authorization Type [employer]    Progress Note Due on Visit 20    PT Start Time 1430    PT Stop Time 1525    PT Time Calculation (min) 55 min    Activity Tolerance Patient tolerated treatment well;No increased pain    Behavior During Therapy [facility] for tasks assessed/performed                  Past Medical History:  Diagnosis Date   Anxiety    Arthritis    bilateral knees   Asthma    childhood, inhaler for wheezing PRN   Autonomic neuropathy    diabetic   Cataract    Celiac disease    Constipation    Depression    Diabetes mellitus    type II, Hemoglobin A1C 9.9 10/05/2011   Diabetic neuropathy (HCC)    Diverticulosis    Fatty liver    Gastroparesis    GERD (gastroesophageal reflux disease)    History of claustrophobia    with MRI tests   Hyperlipidemia    Hypertension    IBS (irritable bowel syndrome)    Kidney stones    Personal history of colonic polyps 03/2010   hyperplastic   PONV (postoperative nausea and vomiting)    Shingles 2021   Past Surgical History:  Procedure Laterality Date   APPENDECTOMY     CATARACT EXTRACTION Right 04/29/2018   CATARACT EXTRACTION Left 03/2018   COLONOSCOPY     DILATATION & CURRETTAGE/HYSTEROSCOPY WITH RESECTOCOPE N/A 03/24/2013   Procedure: DILATATION & CURETTAGE, HYSTEROSCOPY WITH RESECTION;  Surgeon: Levi Aland, MD;  Location: [facility];  Service: Gynecology;  Laterality: N/A;   HYSTEROSCOPY WITH D & C N/A 11/08/2015   Procedure: DILATATION AND CURETTAGE /HYSTEROSCOPY;  Surgeon: Levi Aland, MD;  Location: [facility];  Service: Gynecology;  Laterality: N/A;   left breast cyst removal  2000   LEFT HEART CATH AND  CORONARY ANGIOGRAPHY N/A 10/31/2018   Procedure: LEFT HEART CATH AND CORONARY ANGIOGRAPHY;  Surgeon: Corky Crafts, MD;  Location: [facility] INVASIVE CV LAB;  Service: Cardiovascular;  Laterality: N/A;   TOTAL KNEE ARTHROPLASTY Right 09/12/2022   Procedure: RIGHT TOTAL KNEE ARTHROPLASTY;  Surgeon: Kathryne Hitch, MD;  Location: [facility];  Service: Orthopedics;  Laterality: Right;   TUBAL LIGATION     Patient Active Problem List   Diagnosis Date Noted   Status post total right knee replacement 09/12/2022   Degenerative arthritis of left knee 08/07/2022   Chronic rhinitis 04/18/2022   Gastroesophageal reflux disease 04/18/2022   Vitamin D deficiency 04/13/2022   Morbid obesity (HCC) 04/13/2022   Peripheral sensory neuropathy due to type 2 diabetes mellitus (HCC) 04/05/2020   Bilateral lower extremity edema 01/20/2020   Family history of malignant neoplasm of endometrium 10/08/2018   Celiac disease 02/14/2018   Osteopenia 02/14/2018   Mild intermittent asthma 02/14/2018   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) 02/14/2018   Class 2 obesity due to excess calories with body mass index (BMI) of 37.0 to 37.9 in adult 02/14/2018   Fibroma of tongue 06/06/2016   Renal  calculi 01/13/2016   Degenerative cervical disc 07/27/2015   OSA (obstructive sleep apnea) 02/28/2011   Constipation, slow transit 09/23/2010   Type 2 diabetes mellitus with complication, with long-term current use of insulin (HCC) 09/23/2010   FH: colon cancer 09/23/2010   Personal history of colonic polyps 09/23/2010   Combined hyperlipidemia associated with type 2 diabetes mellitus (HCC) 01/29/2009   Diabetic gastroparesis (HCC) 02/27/2007   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 Mardelle Matte, MD   REFERRING PROVIDER: Vanita Panda. Magnus Ivan, MD  REFERRING DIAG: [phone] (ICD-10-CM) - Status post  total right knee replacement   THERAPY DIAG:  Stiffness of right knee, not elsewhere classified  Acute pain of right knee  Muscle weakness (generalized)  Localized edema  Other abnormalities of gait and mobility  Unsteadiness on feet  Rationale for Evaluation and Treatment: Rehabilitation  ONSET DATE: 09/12/2022  SUBJECTIVE:   SUBJECTIVE STATEMENT: She is able to do more in the house.   PERTINENT HISTORY: Right TKA 09/12/2022, DM2, asthma, OA, peripheral neuropathy, osteopenia, shingles  PAIN:  NPRS scale: today 4/10 riding in car, walking 0/10 and since last PT 7/10 at night Pain location: right knee around joint line and in popliteal area.  Pain description: throbbing Aggravating factors: sit in car, positioning like bed Relieving factors: Tylenol, ice  PRECAUTIONS: Fall  WEIGHT BEARING RESTRICTIONS: No  FALLS:  Has patient fallen in last 6 months? No  LIVING ENVIRONMENT: Lives with: lives with their spouse Lives in: [place] with master handicap accessible bath & bedroom ramped entrance. Does not go upstairs.  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   PATIENT SURVEYS:  10/31/2022: FOTO visit 10 52% Eval: FOTO intake:  21%  predicted:  47%  COGNITION: Overall cognitive status: WFL    SENSATION: WFL  EDEMA:  Circumferential:  LLE: above knee 51.5cm,  around knee 50.2 cm, below knee 42.5 cm RLE: above knee 55.5 cm,  around knee 53.8 cm, below knee 46.2 cm  POSTURE: rounded shoulders,  forward head, flexed trunk , and weight shift left  PALPATION: Right knee tenderness along joint line & incision.   LOWER EXTREMITY ROM:   ROM Right eval Right 10/13/22 Right 10/16/22 Right 10/25/22 Right 10/31/22  Hip flexion       Hip extension       Hip abduction       Hip adduction       Hip internal rotation       Hip external rotation       Knee flexion Seated A: 64* P: 74* Active 75 AA 84* Seated P: 87* A: 77* Seated P: 90* A:83*  Knee extension Seated A: LAQ -22* Seated with LE extended P: -11* Active -7 Supine A: -5*  Supine P: -1* A: quad set -3* SAQ -6*  Ankle dorsiflexion       Ankle plantarflexion       Ankle inversion       Ankle eversion        (  Blank rows = not tested)  LOWER EXTREMITY MMT:  MMT Right eval  Hip flexion   Hip extension   Hip abduction   Hip adduction   Hip internal rotation   Hip external rotation   Knee flexion 3-/5  Knee extension 3-/5  Ankle dorsiflexion   Ankle plantarflexion   Ankle inversion   Ankle eversion    (Blank rows = not tested)  FUNCTIONAL TESTS:  18 inch chair transfer: requires BUE support to arise from 22" w/c to RW for stabilization TUG with RW: 41.75 sec  GAIT: Distance walked: 20' Assistive device utilized: Environmental consultant - 2 wheeled Level of assistance: SBA Comments: Antalgic gait pattern with decreased stance duration right lower extremity, right knee flexed in stance and minimal increase flexion for swing phase and flexed trunk.   TODAY'S TREATMENT                                 DATE: 11/01/2022: Therapeutic Exercise: SciFit seat 12 full revolutions  level 2 BLEs/BUEs for 6 min, BLEs only level 1 for 2 min Leg press DL 16# 1W96, Rt leg only 04# 2X10 Tandem stance on floor with RLE in front & in back 30 sec ea without touching. Sink & chair back for safety. Pt verbalizes how to do at home. Standing in front of sink alternating tapping feet into lower cabinet without UE support X 1 minute. Stepping  towards 3 cones (ant-lat, lat & post-lat) with stance LE flexion on outward motion & ext inward motion with UE support on stance side. 3 reps each LE. PT noted decreased knee control LLE stance.  Standing LLE TKE green theraband 3 sec hold 10 reps. 1st set only TKE, 2nd set with LLE TKE & RLE heel rise/knee flexion Supine Quad set with towel roll 5 sec hold 10 reps Supine SAQ 10 reps  Gait Training:  Pt pushed computer cart similar to grocery cart safely.  Pt amb 50' with cane with SBA & cues on sequencing.  Manual therapy Contract-relax seated knee flexion.  PROM with gentle overpressure  Vaso supine elevation with towel roll under heel for knee ext 34* medium compression 10 min   10/31/2022: Therapeutic Exercise: SciFit seat 12 full revolutions  level 1 BLEs/BUEs for 6 min, BLEs only for 2 min Leg press DL 54# [id number], Rt leg only 19# 2X10 Tandem stance on foam with RLE in front & in back 30 sec ea with intermittent touch Gastroc stretch step heel depression 30 sec hold 2 reps Heel raises BLEs with RUE support reaching upward with LUE 15 reps Supine knee to chest with ball & strap 10 reps 2 sets Standing LLE TKE green theraband 3 sec hold 10 reps. 1st set only TKE, 2nd set with LLE TKE & RLE heel rise/knee flexion Supine Quad set with towel roll 5 sec hold 10 reps Supine SAQ 10 reps  Gait Training:  Pt amb 30' with cane with CGA & cues on sequencing.  Manual therapy Contract-relax seated knee flexion.  PROM with gentle overpressure Supine knee ext grade III mob.  Vaso supine elevation with towel roll under heel for knee ext 34* medium compression 10 min    10/30/2022: Therapeutic Exercise: Leg press DL 14# 7W29, Rt leg only 56# 2X10 Tandem stance on foam with RLE in front & in back 30 sec ea with intermittent touch Gastroc stretch step heel depression 30 sec hold 2 reps Heel raises  BLEs with RUE support reaching upward with LUE 15 reps Supine knee to chest with ball & strap  10 reps 2 sets Standing LLE TKE green theraband 3 sec hold 10 reps. 1st set only TKE, 2nd set with LLE TKE & RLE heel rise/knee flexion  Self-care: PT instructed with demo & verbal cues in ice massage. Pt verbalized & return demo understanding.   Manual therapy Contract-relax seated knee flexion.  PROM with gentle overpressure  Vaso supine elevation with towel roll under heel for knee ext 34* medium compression 10 min    HOME EXERCISE PROGRAM: Access Code: [email or web address] URL: [email or web address] Date: 10/11/2022 Prepared by: Vladimir Faster  Exercises - Ankle Alphabet in Elevation  - 2-4 x daily - 7 x weekly - 1 sets - 1 reps - supine quad set with towel roll under ankle  - 2-4 x daily - 7 x weekly - 2 sets - 10 reps - 5 seconds hold - Supine Heel Slide with Strap  - 2-3 x daily - 7 x weekly - 2-3 sets - 10 reps - 5 seconds hold - Seated Knee Flexion Extension AROM   - 2-4 x daily - 7 x weekly - 2-3 sets - 10 reps - 5 seconds hold - Supine Knee Extension Strengthening  - 2-4 x daily - 7 x weekly - 2-3 sets - 10 reps - 5 seconds hold - Seated Long Arc Quad  - 2-4 x daily - 7 x weekly - 2-3 sets - 10 reps - 5 seconds hold  ASSESSMENT: CLINICAL IMPRESSION: Patient is improving function with improved gait with cane noted today.  Patient is slowly improving range with functional activities.  Patient continues to benefit from skilled PT.   OBJECTIVE IMPAIRMENTS: Abnormal gait, decreased activity tolerance, decreased balance, decreased endurance, decreased knowledge of condition, decreased knowledge of use of DME, decreased mobility, difficulty walking, decreased ROM, decreased strength, increased edema, postural dysfunction, obesity, and pain.   ACTIVITY LIMITATIONS: carrying, lifting, bending, sitting, standing, squatting, sleeping, stairs, transfers, bed mobility, and locomotion level  PARTICIPATION LIMITATIONS: meal prep, cleaning, driving, community activity, and  church  PERSONAL FACTORS: Fitness, Past/current experiences, Time since onset of injury/illness/exacerbation, and 3+ comorbidities: see PMH  are also affecting patient's functional outcome.   REHAB POTENTIAL: Good  CLINICAL DECISION MAKING: Stable/uncomplicated  EVALUATION COMPLEXITY: Low   GOALS: Goals reviewed with patient? Yes  SHORT TERM GOALS: (target date for Short term goals 11/10/2022)   1.  Patient will demonstrate independent use of home exercise program to maintain progress from in clinic treatments. Baseline: see objective data Goal status:  MET 10/31/2022  2. PROM right knee ext -5* and flexion 90* Baseline: see objective data Goal status: MET 10/31/2022  3. Interim FOTO >32% Baseline: see objective data  Goal status:  MET 10/31/2022  LONG TERM GOALS: (target dates for all long term goals  12/29/2022 )   1. Patient will demonstrate/report pain at worst less than or equal to 2/10 to facilitate minimal limitation in daily activity secondary to pain symptoms. Baseline: see objective data Goal status: Ongoing 10/24/2022   2. Patient will demonstrate independent use of home exercise program to facilitate ability to maintain/progress functional gains from skilled physical therapy services. Baseline: see objective data Goal status: Ongoing 10/24/2022   3. Patient will demonstrate FOTO outcome > or = 47 % to indicate reduced disability due to condition. Baseline: see objective data Goal status:  MET 10/31/2022   4.  Patient will demonstrate right knee MMT 4/5 throughout  to faciltiate usual transfers, stairs, squatting at [facility] for daily life.  Baseline: see objective data Goal status: Ongoing 10/24/2022   5.  Patient right knee AROM ext standing -2* and flexion seated or supine 100* Baseline: see objective data Goal status: Ongoing 10/24/2022   6.  patient amb with LRAD >500' and negotiates ramps/curbs/stairs modified independent.  Baseline: see objective data Goal status:  Ongoing 10/24/2022   PLAN:  PT FREQUENCY:  2-3x/wk  PT DURATION: 12 weeks  PLANNED INTERVENTIONS: Therapeutic exercises, Therapeutic activity, Neuro Muscular re-education, Balance training, Gait training, Patient/Family education, Joint mobilization, Stair training, DME instructions, Dry Needling, Electrical stimulation, Traction, Cryotherapy, vasopneumatic deviceMoist heat, Taping, Ultrasound, Ionotophoresis 4mg /ml Dexamethasone, and aquatic therapy, Manual therapy.  All included unless contraindicated  PLAN FOR NEXT SESSION:   check ROM, send progress note prior to MD appt, continue gait with cane, continue manual therapy & exercise for range, progressing standing balance/functional activities, vaso to end for edema.      Vladimir Faster, PT, DPT 11/01/2022, 3:33 PM

## 2022-11-06 ENCOUNTER — Ambulatory Visit (INDEPENDENT_AMBULATORY_CARE_PROVIDER_SITE_OTHER): Payer: Medicare Other | Admitting: Physical Therapy

## 2022-11-06 ENCOUNTER — Encounter: Payer: Self-pay | Admitting: Physical Therapy

## 2022-11-06 DIAGNOSIS — R600 Localized edema: Secondary | ICD-10-CM

## 2022-11-06 DIAGNOSIS — M25561 Pain in right knee: Secondary | ICD-10-CM | POA: Diagnosis not present

## 2022-11-06 DIAGNOSIS — M25661 Stiffness of right knee, not elsewhere classified: Secondary | ICD-10-CM | POA: Diagnosis not present

## 2022-11-06 DIAGNOSIS — R2681 Unsteadiness on feet: Secondary | ICD-10-CM

## 2022-11-06 DIAGNOSIS — M6281 Muscle weakness (generalized): Secondary | ICD-10-CM

## 2022-11-06 DIAGNOSIS — R2689 Other abnormalities of gait and mobility: Secondary | ICD-10-CM

## 2022-11-06 NOTE — Therapy (Signed)
OUTPATIENT PHYSICAL THERAPY LOWER EXTREMITY TREATMENT   Patient Name: Jessica [name] MRN: [id number] DOB:April 17, 1951, 72 y.o., female Today's Date: 11/06/2022   END OF SESSION:  PT End of Session - 11/06/22 1427     Visit Number 12    Number of Visits 27    Date for PT Re-Evaluation 12/29/22    Authorization Type [employer]    Progress Note Due on Visit 20    PT Start Time 1427    PT Stop Time 1525    PT Time Calculation (min) 58 min    Activity Tolerance Patient tolerated treatment well;No increased pain    Behavior During Therapy [facility] for tasks assessed/performed                   Past Medical History:  Diagnosis Date   Anxiety    Arthritis    bilateral knees   Asthma    childhood, inhaler for wheezing PRN   Autonomic neuropathy    diabetic   Cataract    Celiac disease    Constipation    Depression    Diabetes mellitus    type II, Hemoglobin A1C 9.9 10/05/2011   Diabetic neuropathy (HCC)    Diverticulosis    Fatty liver    Gastroparesis    GERD (gastroesophageal reflux disease)    History of claustrophobia    with MRI tests   Hyperlipidemia    Hypertension    IBS (irritable bowel syndrome)    Kidney stones    Personal history of colonic polyps 03/2010   hyperplastic   PONV (postoperative nausea and vomiting)    Shingles 2021   Past Surgical History:  Procedure Laterality Date   APPENDECTOMY     CATARACT EXTRACTION Right 04/29/2018   CATARACT EXTRACTION Left 03/2018   COLONOSCOPY     DILATATION & CURRETTAGE/HYSTEROSCOPY WITH RESECTOCOPE N/A 03/24/2013   Procedure: DILATATION & CURETTAGE, HYSTEROSCOPY WITH RESECTION;  Surgeon: Levi Aland, MD;  Location: [facility];  Service: Gynecology;  Laterality: N/A;   HYSTEROSCOPY WITH D & C N/A 11/08/2015   Procedure: DILATATION AND CURETTAGE /HYSTEROSCOPY;  Surgeon: Levi Aland, MD;  Location: [facility];  Service: Gynecology;  Laterality: N/A;   left breast cyst removal  2000   LEFT HEART CATH AND  CORONARY ANGIOGRAPHY N/A 10/31/2018   Procedure: LEFT HEART CATH AND CORONARY ANGIOGRAPHY;  Surgeon: Corky Crafts, MD;  Location: [facility] INVASIVE CV LAB;  Service: Cardiovascular;  Laterality: N/A;   TOTAL KNEE ARTHROPLASTY Right 09/12/2022   Procedure: RIGHT TOTAL KNEE ARTHROPLASTY;  Surgeon: Kathryne Hitch, MD;  Location: [facility];  Service: Orthopedics;  Laterality: Right;   TUBAL LIGATION     Patient Active Problem List   Diagnosis Date Noted   Status post total right knee replacement 09/12/2022   Degenerative arthritis of left knee 08/07/2022   Chronic rhinitis 04/18/2022   Gastroesophageal reflux disease 04/18/2022   Vitamin D deficiency 04/13/2022   Morbid obesity (HCC) 04/13/2022   Peripheral sensory neuropathy due to type 2 diabetes mellitus (HCC) 04/05/2020   Bilateral lower extremity edema 01/20/2020   Family history of malignant neoplasm of endometrium 10/08/2018   Celiac disease 02/14/2018   Osteopenia 02/14/2018   Mild intermittent asthma 02/14/2018   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) 02/14/2018   Class 2 obesity due to excess calories with body mass index (BMI) of 37.0 to 37.9 in adult 02/14/2018   Fibroma of tongue 06/06/2016  Renal calculi 01/13/2016   Degenerative cervical disc 07/27/2015   OSA (obstructive sleep apnea) 02/28/2011   Constipation, slow transit 09/23/2010   Type 2 diabetes mellitus with complication, with long-term current use of insulin (HCC) 09/23/2010   FH: colon cancer 09/23/2010   Personal history of colonic polyps 09/23/2010   Combined hyperlipidemia associated with type 2 diabetes mellitus (HCC) 01/29/2009   Diabetic gastroparesis (HCC) 02/27/2007   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 Mardelle Matte, MD   REFERRING PROVIDER: Vanita Panda. Magnus Ivan, MD  REFERRING DIAG: [phone] (ICD-10-CM) - Status post  total right knee replacement   THERAPY DIAG:  Stiffness of right knee, not elsewhere classified  Acute pain of right knee  Muscle weakness (generalized)  Localized edema  Other abnormalities of gait and mobility  Unsteadiness on feet  Rationale for Evaluation and Treatment: Rehabilitation  ONSET DATE: 09/12/2022  SUBJECTIVE:   SUBJECTIVE STATEMENT: She went to [employer] (picked up 5 items) driving herself and used RW in/out & cart around store without issues.  PERTINENT HISTORY: Right TKA 09/12/2022, DM2, asthma, OA, peripheral neuropathy, osteopenia, shingles  PAIN:  NPRS scale: today  4/10 riding in car, walking 2/10 and since last PT 9/10 at night Pain location: right knee around joint line and in popliteal area.  Pain description: throbbing Aggravating factors: sit in car, positioning like bed Relieving factors: Tylenol, ice  PRECAUTIONS: Fall  WEIGHT BEARING RESTRICTIONS: No  FALLS:  Has patient fallen in last 6 months? No  LIVING ENVIRONMENT: Lives with: lives with their spouse Lives in: [place] with master handicap accessible bath & bedroom ramped entrance. Does not go upstairs.  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   PATIENT SURVEYS:  10/31/2022: FOTO visit 10 52% Eval: FOTO intake:  21%  predicted:  47%  COGNITION: Overall cognitive status: WFL    SENSATION: WFL  EDEMA:  Circumferential:  LLE: above knee 51.5cm,  around knee 50.2 cm, below knee 42.5 cm RLE: above knee 55.5  cm,  around knee 53.8 cm, below knee 46.2 cm  POSTURE: rounded shoulders, forward head, flexed trunk , and weight shift left  PALPATION: Right knee tenderness along joint line & incision.   LOWER EXTREMITY ROM:   ROM Right eval Right 10/13/22 Right 10/16/22 Right 10/25/22 Right 10/31/22  Hip flexion       Hip extension       Hip abduction       Hip adduction       Hip internal rotation       Hip external rotation       Knee flexion Seated A: 64* P: 74* Active 75 AA 84* Seated P: 87* A: 77* Seated P: 90* A:83*  Knee extension Seated A: LAQ -22* Seated with LE extended P: -11* Active -7 Supine A: -5*  Supine P: -1* A: quad set -3* SAQ -6*  Ankle dorsiflexion       Ankle plantarflexion       Ankle  inversion       Ankle eversion        (Blank rows = not tested)  LOWER EXTREMITY MMT:  MMT Right eval  Hip flexion   Hip extension   Hip abduction   Hip adduction   Hip internal rotation   Hip external rotation   Knee flexion 3-/5  Knee extension 3-/5  Ankle dorsiflexion   Ankle plantarflexion   Ankle inversion   Ankle eversion    (Blank rows = not tested)  FUNCTIONAL TESTS:  18 inch chair transfer: requires BUE support to arise from 22" w/c to RW for stabilization TUG with RW: 41.75 sec  GAIT: Distance walked: 20' Assistive device utilized: Environmental consultant - 2 wheeled Level of assistance: SBA Comments: Antalgic gait pattern with decreased stance duration right lower extremity, right knee flexed in stance and minimal increase flexion for swing phase and flexed trunk.   TODAY'S TREATMENT                                 DATE: 11/06/2022: Therapeutic Exercise: SciFit seat 12 full revolutions  level 2 BLEs/BUEs for 4 min, BLEs only level 1 for 4 min Leg press DL 841# 3K44, Rt leg only 50# 2X10 Gastroc stretch forefoot on towel roll with wt shift on RLE 30 sec hold 3 reps Heel raises 10 reps 2 sets Tandem stance on floor with RLE in front & in back 30 sec ea without  touching. Sink & chair back for safety. Hamstring stretch RLE long sit 30 sec 2 reps Quad stretch hooklying RLE over edge with strap knee flexion 30 sec hold 2 reps PT added muscle stretches, tandem stance and heel raises to HEP with HO, demo & verbal cues. Pt verbalized & return demo.   Gait Training:  Pt amb 100' x 2 with cane safely.  Manual therapy Contract-relax seated knee flexion.  PROM with gentle overpressure  Vaso supine elevation with towel roll under heel for knee ext 34* medium compression 10 min   11/01/2022: Therapeutic Exercise: SciFit seat 12 full revolutions  level 2 BLEs/BUEs for 6 min, BLEs only level 1 for 2 min Leg press DL 01# [id number], Rt leg only 53# 2X10 Tandem stance on floor with RLE in front & in back 30 sec ea without touching. Sink & chair back for safety. Pt verbalizes how to do at home. Standing in front of sink alternating tapping feet into lower cabinet without UE support X 1 minute. Stepping towards 3 cones (ant-lat, lat & post-lat) with stance LE flexion on outward motion & ext inward motion with UE support on stance side. 3 reps each LE. PT noted decreased knee control LLE stance.  Standing LLE TKE green theraband 3 sec hold 10 reps. 1st set only TKE, 2nd set with LLE TKE & RLE heel rise/knee flexion Supine Quad set with towel roll 5 sec hold 10 reps Supine SAQ 10 reps  Gait Training:  Pt pushed computer cart similar to grocery cart safely.  Pt amb 50' with cane with SBA & cues on sequencing.  Manual therapy Contract-relax seated knee flexion.  PROM with gentle overpressure  Vaso supine elevation with towel roll under heel for knee ext 34* medium compression 10 min   10/31/2022: Therapeutic Exercise: SciFit seat 12 full revolutions  level 1 BLEs/BUEs for 6 min, BLEs only for 2 min Leg press DL 66# 4Q03, Rt leg only 47# 2X10 Tandem stance on  foam with RLE in front & in back 30 sec ea with intermittent touch Gastroc stretch step heel depression 30  sec hold 2 reps Heel raises BLEs with RUE support reaching upward with LUE 15 reps Supine knee to chest with ball & strap 10 reps 2 sets Standing LLE TKE green theraband 3 sec hold 10 reps. 1st set only TKE, 2nd set with LLE TKE & RLE heel rise/knee flexion Supine Quad set with towel roll 5 sec hold 10 reps Supine SAQ 10 reps  Gait Training:  Pt amb 30' with cane with CGA & cues on sequencing.  Manual therapy Contract-relax seated knee flexion.  PROM with gentle overpressure Supine knee ext grade III mob.  Vaso supine elevation with towel roll under heel for knee ext 34* medium compression 10 min   HOME EXERCISE PROGRAM: Access Code: [email or web address] URL: [email or web address] Date: 11/06/2022 Prepared by: Vladimir Faster  Exercises - Ankle Alphabet in Elevation  - 2-4 x daily - 7 x weekly - 1 sets - 1 reps - supine quad set with towel roll under ankle  - 2-4 x daily - 7 x weekly - 2 sets - 10 reps - 5 seconds hold - Supine Heel Slide with Strap  - 2-3 x daily - 7 x weekly - 2-3 sets - 10 reps - 5 seconds hold - Seated Knee Flexion Extension AROM   - 2-4 x daily - 7 x weekly - 2-3 sets - 10 reps - 5 seconds hold - Supine Knee Extension Strengthening  - 2-4 x daily - 7 x weekly - 2-3 sets - 10 reps - 5 seconds hold - Seated Long Arc Quad  - 2-4 x daily - 7 x weekly - 2-3 sets - 10 reps - 5 seconds hold - standing calf stretch with forefoot on small step or brick  - 1 x daily - 7 x weekly - 1 sets - 3 reps - 30 seconds hold - Seated Table Hamstring Stretch  - 1 x daily - 7 x weekly - 1 sets - 3 reps - 30 seconds hold - Supine Quadriceps Stretch with Strap on Table  - 1 x daily - 7 x weekly - 1 sets - 3 reps - 30 seconds hold - Standing Tandem Balance with Counter Support  - 1 x daily - 7 x weekly - 1 sets - 2 reps - 30 seconds hold - Heel raises near counter  - 1 x daily - 7 x weekly - 2 sets - 10 reps - 5 seconds hold  ASSESSMENT: CLINICAL IMPRESSION: Patient appears to  understand updated HEP to include muscle stretches, tandem stance, heel raises and walking program.   Patient continues to benefit from skilled PT.   OBJECTIVE IMPAIRMENTS: Abnormal gait, decreased activity tolerance, decreased balance, decreased endurance, decreased knowledge of condition, decreased knowledge of use of DME, decreased mobility, difficulty walking, decreased ROM, decreased strength, increased edema, postural dysfunction, obesity, and pain.   ACTIVITY LIMITATIONS: carrying, lifting, bending, sitting, standing, squatting, sleeping, stairs, transfers, bed mobility, and locomotion level  PARTICIPATION LIMITATIONS: meal prep, cleaning, driving, community activity, and church  PERSONAL FACTORS: Fitness, Past/current experiences, Time since onset of injury/illness/exacerbation, and 3+ comorbidities: see PMH  are also affecting patient's functional outcome.   REHAB POTENTIAL: Good  CLINICAL DECISION MAKING: Stable/uncomplicated  EVALUATION COMPLEXITY: Low   GOALS: Goals reviewed with patient? Yes  SHORT TERM GOALS: (target date for Short term goals 11/10/2022)   1.  Patient will demonstrate independent use of home exercise  program to maintain progress from in clinic treatments. Baseline: see objective data Goal status:  MET 10/31/2022  2. PROM right knee ext -5* and flexion 90* Baseline: see objective data Goal status: MET 10/31/2022  3. Interim FOTO >32% Baseline: see objective data  Goal status:  MET 10/31/2022  LONG TERM GOALS: (target dates for all long term goals  12/29/2022 )   1. Patient will demonstrate/report pain at worst less than or equal to 2/10 to facilitate minimal limitation in daily activity secondary to pain symptoms. Baseline: see objective data Goal status: Ongoing 11/06/2022   2. Patient will demonstrate independent use of home exercise program to facilitate ability to maintain/progress functional gains from skilled physical therapy services. Baseline: see  objective data Goal status: Ongoing 11/06/2022   3. Patient will demonstrate FOTO outcome > or = 47 % to indicate reduced disability due to condition. Baseline: see objective data Goal status:  MET 10/31/2022   4.  Patient will demonstrate right knee MMT 4/5 throughout to faciltiate usual transfers, stairs, squatting at [facility] for daily life.  Baseline: see objective data Goal status: Ongoing 11/06/2022   5.  Patient right knee AROM ext standing -2* and flexion seated or supine 100* Baseline: see objective data Goal status: Ongoing 11/06/2022   6.  patient amb with LRAD >500' and negotiates ramps/curbs/stairs modified independent.  Baseline: see objective data Goal status: Ongoing 11/06/2022   PLAN:  PT FREQUENCY:  2-3x/wk  PT DURATION: 12 weeks  PLANNED INTERVENTIONS: Therapeutic exercises, Therapeutic activity, Neuro Muscular re-education, Balance training, Gait training, Patient/Family education, Joint mobilization, Stair training, DME instructions, Dry Needling, Electrical stimulation, Traction, Cryotherapy, vasopneumatic deviceMoist heat, Taping, Ultrasound, Ionotophoresis 4mg /ml Dexamethasone, and aquatic therapy, Manual therapy.  All included unless contraindicated  PLAN FOR NEXT SESSION:   send note to MD as she has an appt immediately following PT,  check ROM, continue gait with cane, continue manual therapy & exercise for range, progressing standing balance/functional activities, vaso to end for edema.      Vladimir Faster, PT, DPT 11/06/2022, 4:37 PM

## 2022-11-08 ENCOUNTER — Ambulatory Visit (INDEPENDENT_AMBULATORY_CARE_PROVIDER_SITE_OTHER): Payer: Medicare Other | Admitting: Rehabilitative and Restorative Service Providers"

## 2022-11-08 ENCOUNTER — Encounter: Payer: Self-pay | Admitting: Orthopaedic Surgery

## 2022-11-08 ENCOUNTER — Ambulatory Visit (INDEPENDENT_AMBULATORY_CARE_PROVIDER_SITE_OTHER): Payer: Medicare Other | Admitting: Orthopaedic Surgery

## 2022-11-08 ENCOUNTER — Encounter: Payer: Medicare Other | Admitting: Physical Therapy

## 2022-11-08 ENCOUNTER — Encounter: Payer: Self-pay | Admitting: Rehabilitative and Restorative Service Providers"

## 2022-11-08 DIAGNOSIS — M25561 Pain in right knee: Secondary | ICD-10-CM | POA: Diagnosis not present

## 2022-11-08 DIAGNOSIS — M25661 Stiffness of right knee, not elsewhere classified: Secondary | ICD-10-CM | POA: Diagnosis not present

## 2022-11-08 DIAGNOSIS — M6281 Muscle weakness (generalized): Secondary | ICD-10-CM | POA: Diagnosis not present

## 2022-11-08 DIAGNOSIS — R600 Localized edema: Secondary | ICD-10-CM

## 2022-11-08 DIAGNOSIS — T8482XD Fibrosis due to internal orthopedic prosthetic devices, implants and grafts, subsequent encounter: Secondary | ICD-10-CM

## 2022-11-08 DIAGNOSIS — Z96651 Presence of right artificial knee joint: Secondary | ICD-10-CM

## 2022-11-08 DIAGNOSIS — R2689 Other abnormalities of gait and mobility: Secondary | ICD-10-CM

## 2022-11-08 DIAGNOSIS — R2681 Unsteadiness on feet: Secondary | ICD-10-CM

## 2022-11-08 NOTE — Progress Notes (Signed)
The patient is now past 6 weeks status post a right total knee arthroplasty.  She has been working hard to physical therapy but unfortunately has not been able to get her knee flexed past 90 degrees.  On my exam today her extension is almost full but I can only flex her to maybe 80 degrees.  We talked in length in detail about recommending a manipulation under anesthesia.  I described what this procedure entails and described the risk and benefits of this type of procedure.  She understands that we are recommending this and she understands that this will give her an opportunity get the best outcome in terms of getting her motion back but she will still have to put herself hard through therapy even after this type of procedure.  After long and thorough discussion she does wish to proceed with a manipulation of her right knee under anesthesia due to postoperative arthrofibrosis.  We will work on getting this scheduled and then we would see her back at 2 weeks postoperative.  At that visit we will have a standing AP and lateral of her right knee.  All questions and concerns were addressed and answered.

## 2022-11-08 NOTE — Therapy (Signed)
OUTPATIENT PHYSICAL THERAPY  TREATMENT   Patient Name: Jessica [name] MRN: [id number] DOB:[birth year], 72 y.o., female Today's Date: 11/08/2022   END OF SESSION:  PT End of Session - 11/08/22 1210     Visit Number 13    Number of Visits 27    Date for PT Re-Evaluation 12/29/22    Authorization Type [employer] [facility]    Authorization - Visit Number 13    Progress Note Due on Visit 20    PT Start Time 1140    PT Stop Time 1230    PT Time Calculation (min) 50 min    Activity Tolerance Patient limited by pain    Behavior During Therapy [facility] for tasks assessed/performed                    Past Medical History:  Diagnosis Date   Anxiety    Arthritis    bilateral knees   Asthma    childhood, inhaler for wheezing PRN   Autonomic neuropathy    diabetic   Cataract    Celiac disease    Constipation    Depression    Diabetes mellitus    type II, Hemoglobin A1C 9.9 10/05/2011   Diabetic neuropathy (HCC)    Diverticulosis    Fatty liver    Gastroparesis    GERD (gastroesophageal reflux disease)    History of claustrophobia    with MRI tests   Hyperlipidemia    Hypertension    IBS (irritable bowel syndrome)    Kidney stones    Personal history of colonic polyps 03/2010   hyperplastic   PONV (postoperative nausea and vomiting)    Shingles 2021   Past Surgical History:  Procedure Laterality Date   APPENDECTOMY     CATARACT EXTRACTION Right 04/29/2018   CATARACT EXTRACTION Left 03/2018   COLONOSCOPY     DILATATION & CURRETTAGE/HYSTEROSCOPY WITH RESECTOCOPE N/A 03/24/2013   Procedure: DILATATION & CURETTAGE, HYSTEROSCOPY WITH RESECTION;  Surgeon: Levi Aland, MD;  Location: [facility];  Service: Gynecology;  Laterality: N/A;   HYSTEROSCOPY WITH D & C N/A 11/08/2015   Procedure: DILATATION AND CURETTAGE /HYSTEROSCOPY;  Surgeon: Levi Aland, MD;  Location: [facility];  Service: Gynecology;  Laterality: N/A;   left breast cyst removal  2000   LEFT HEART CATH AND  CORONARY ANGIOGRAPHY N/A 10/31/2018   Procedure: LEFT HEART CATH AND CORONARY ANGIOGRAPHY;  Surgeon: Corky Crafts, MD;  Location: [facility] INVASIVE CV LAB;  Service: Cardiovascular;  Laterality: N/A;   TOTAL KNEE ARTHROPLASTY Right 09/12/2022   Procedure: RIGHT TOTAL KNEE ARTHROPLASTY;  Surgeon: Kathryne Hitch, MD;  Location: [facility] OR;  Service: Orthopedics;  Laterality: Right;   TUBAL LIGATION     Patient Active Problem List   Diagnosis Date Noted   Status post total right knee replacement 09/12/2022   Degenerative arthritis of left knee 08/07/2022   Chronic rhinitis 04/18/2022   Gastroesophageal reflux disease 04/18/2022   Vitamin D deficiency 04/13/2022   Morbid obesity (HCC) 04/13/2022   Peripheral sensory neuropathy due to type 2 diabetes mellitus (HCC) 04/05/2020   Bilateral lower extremity edema 01/20/2020   Family history of malignant neoplasm of endometrium 10/08/2018   Celiac disease 02/14/2018   Osteopenia 02/14/2018   Mild intermittent asthma 02/14/2018   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) 02/14/2018   Class 2 obesity due to excess calories with body mass index (BMI) of 37.0 to 37.9 in adult 02/14/2018  Fibroma of tongue 06/06/2016   Renal calculi 01/13/2016   Degenerative cervical disc 07/27/2015   OSA (obstructive sleep apnea) 02/28/2011   Constipation, slow transit 09/23/2010   Type 2 diabetes mellitus with complication, with long-term current use of insulin (HCC) 09/23/2010   FH: colon cancer 09/23/2010   Personal history of colonic polyps 09/23/2010   Combined hyperlipidemia associated with type 2 diabetes mellitus (HCC) 01/29/2009   Diabetic gastroparesis (HCC) 02/27/2007   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 Mardelle Matte, MD   REFERRING PROVIDER: Vanita Panda. Magnus Ivan, MD  REFERRING DIAG: [phone] (ICD-10-CM) - Status post  total right knee replacement   THERAPY DIAG:  Stiffness of right knee, not elsewhere classified  Acute pain of right knee  Muscle weakness (generalized)  Localized edema  Other abnormalities of gait and mobility  Unsteadiness on feet  Rationale for Evaluation and Treatment: Rehabilitation  ONSET DATE: 09/12/2022  SUBJECTIVE:   SUBJECTIVE STATEMENT: She indicated having continued complaints of static positioning and night time.   PERTINENT HISTORY: Right TKA 09/12/2022, DM2, asthma, OA, peripheral neuropathy, osteopenia, shingles  PAIN:  NPRS scale: upon arrival 5/10, at worst 9/10 Pain location: right knee around joint line and in popliteal area.  Pain description: throbbing Aggravating factors: riding in car, inactivity Relieving factors: Tylenol, ice  PRECAUTIONS: Fall  WEIGHT BEARING RESTRICTIONS: No  FALLS:  Has patient fallen in last 6 months? No  LIVING ENVIRONMENT: Lives with: lives with their spouse Lives in: [place] with master handicap accessible bath & bedroom ramped entrance. Does not go upstairs.  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   PATIENT SURVEYS:  10/31/2022: FOTO visit 10 52%  Eval: FOTO intake:  21%  predicted:  47%  COGNITION:10/11/2022 Overall cognitive status: WFL    SENSATION: 10/11/2022 WFL  EDEMA:  10/11/2022 Circumferential:  LLE: above knee 51.5cm,  around knee 50.2 cm, below knee 42.5 cm RLE: above knee 55.5 cm,  around knee 53.8 cm, below knee 46.2  cm  POSTURE: 10/11/2022  rounded shoulders, forward head, flexed trunk , and weight shift left  PALPATION: 10/11/2022 Right knee tenderness along joint line & incision.   LOWER EXTREMITY ROM:   ROM Right eval Right 10/13/22 Right 10/16/22 Right 10/25/22 Right 10/31/22 Right 11/08/2022  Hip flexion        Hip extension        Hip abduction        Hip adduction        Hip internal rotation        Hip external rotation        Knee flexion Seated A: 64* P: 74* Active 75 AA 84* Seated P: 87* A: 77* Seated P: 90* A:83* Supine AROM heel slide: 80     Knee extension Seated A: LAQ -22* Seated with LE extended P: -11* Active -7 Supine A: -5*  Supine P: -1* A: quad set -3* SAQ -6* Seated AROM LAQ: -14  Supine quad set AROM -6  Ankle dorsiflexion        Ankle plantarflexion        Ankle inversion        Ankle eversion         (Blank rows = not tested)  LOWER EXTREMITY MMT:  MMT Right 10/11/2022 Right 11/08/2022  Hip flexion    Hip extension    Hip abduction    Hip adduction    Hip internal rotation    Hip external rotation    Knee flexion 3-/5 5/5 in available range  Knee extension 3-/5 4-+/5 in available range  Ankle dorsiflexion    Ankle plantarflexion    Ankle inversion    Ankle eversion     (Blank rows = not tested)  FUNCTIONAL TESTS:  11/08/2022:  24 seconds with FWW  10/11/2022 18 inch chair transfer: requires BUE support to arise from 22" w/c to RW for stabilization TUG with RW: 41.75 sec  GAIT: 11/08/2022:  Ambulation into clinic c FWW.   10/11/2022 Distance walked: 20' Assistive device utilized: Environmental consultant - 2 wheeled Level of assistance: SBA Comments: Antalgic gait pattern with decreased stance duration right lower extremity, right knee flexed in stance and minimal increase flexion for swing phase and flexed trunk.                     TODAY'S TREATMENT                                   DATE: 11/08/2022: Therapeutic Exercise: SciFit seat 12 full  revolutions  level 2 UE/LE 8 mins Seated tailgate flexion swinging 2 mins Supine Rt AROM 5 sec hold x 5 Rt seated LAQ x 5 Leg press DL 409# x 25 , Rt leg only 50# x 20  TherActivity TUG x 1 c FWW SPC use in clinic household distances with mild cues at times for sequencing.  Performed 30 ft. 60 ft, 60 ft c supervision.   Manual therapy Supine Rt knee flexion c mobilization c movement IR/Distraction, contract relax sequencing for flexion stretching.   Vasopneumatic Device Supine elevation with towel roll under heel for knee ext 34* medium compression 10 min  TODAY'S TREATMENT                                   DATE:11/06/2022: Therapeutic Exercise: SciFit seat 12 full revolutions  level 2 BLEs/BUEs for 4 min, BLEs only level 1 for 4 min Leg press DL 811# [id number], Rt leg only 50# 2X10 Gastroc stretch forefoot on towel roll with wt shift on RLE 30 sec hold 3 reps Heel raises 10 reps 2 sets Tandem stance on floor with RLE in front & in back 30 sec ea without touching. Sink & chair back for safety. Hamstring stretch RLE long sit 30 sec 2 reps Quad stretch hooklying RLE over edge with strap knee flexion 30 sec hold 2 reps PT added muscle stretches, tandem stance and heel raises to HEP with HO, demo & verbal cues. Pt verbalized & return demo.   Gait Training:  Pt amb 100' x 2 with cane safely.  Manual therapy Contract-relax seated knee flexion.  PROM with gentle overpressure  Vaso supine elevation with towel roll under heel for knee ext 34* medium compression 10 min   TODAY'S TREATMENT  DATE7/06/2022: Therapeutic Exercise: SciFit seat 12 full revolutions  level 2 BLEs/BUEs for 6 min, BLEs only level 1 for 2 min Leg press DL 16# [id number], Rt leg only 04# 2X10 Tandem stance on floor with RLE in front & in back 30 sec ea without touching. Sink & chair back for safety. Pt verbalizes how to do at home. Standing in front of sink alternating tapping feet into lower  cabinet without UE support X 1 minute. Stepping towards 3 cones (ant-lat, lat & post-lat) with stance LE flexion on outward motion & ext inward motion with UE support on stance side. 3 reps each LE. PT noted decreased knee control LLE stance.  Standing LLE TKE green theraband 3 sec hold 10 reps. 1st set only TKE, 2nd set with LLE TKE & RLE heel rise/knee flexion Supine Quad set with towel roll 5 sec hold 10 reps Supine SAQ 10 reps  Gait Training:  Pt pushed computer cart similar to grocery cart safely.  Pt amb 50' with cane with SBA & cues on sequencing.  Manual therapy Contract-relax seated knee flexion.  PROM with gentle overpressure  Vaso supine elevation with towel roll under heel for knee ext 34* medium compression 10 min    HOME EXERCISE PROGRAM: Access Code: [email or web address] URL: [email or web address] Date: 11/06/2022 Prepared by: Vladimir Faster  Exercises - Ankle Alphabet in Elevation  - 2-4 x daily - 7 x weekly - 1 sets - 1 reps - supine quad set with towel roll under ankle  - 2-4 x daily - 7 x weekly - 2 sets - 10 reps - 5 seconds hold - Supine Heel Slide with Strap  - 2-3 x daily - 7 x weekly - 2-3 sets - 10 reps - 5 seconds hold - Seated Knee Flexion Extension AROM   - 2-4 x daily - 7 x weekly - 2-3 sets - 10 reps - 5 seconds hold - Supine Knee Extension Strengthening  - 2-4 x daily - 7 x weekly - 2-3 sets - 10 reps - 5 seconds hold - Seated Long Arc Quad  - 2-4 x daily - 7 x weekly - 2-3 sets - 10 reps - 5 seconds hold - standing calf stretch with forefoot on small step or brick  - 1 x daily - 7 x weekly - 1 sets - 3 reps - 30 seconds hold - Seated Table Hamstring Stretch  - 1 x daily - 7 x weekly - 1 sets - 3 reps - 30 seconds hold - Supine Quadriceps Stretch with Strap on Table  - 1 x daily - 7 x weekly - 1 sets - 3 reps - 30 seconds hold - Standing Tandem Balance with Counter Support  - 1 x daily - 7 x weekly - 1 sets - 2 reps - 30 seconds hold - Heel raises  near counter  - 1 x daily - 7 x weekly - 2 sets - 10 reps - 5 seconds hold  ASSESSMENT: CLINICAL IMPRESSION: Continued skilled PT services may be benefit to continue to address restriction.  Flexion and extension mobility show strong resistance to gains with tightness in capsule and myofascial guarding noted.  Pt was able to walk in clinic level surfaces safe with cane today.  May use at home some.    OBJECTIVE IMPAIRMENTS: Abnormal gait, decreased activity tolerance, decreased balance, decreased endurance, decreased knowledge of condition, decreased knowledge of use of DME, decreased mobility, difficulty walking, decreased ROM, decreased strength, increased edema, postural dysfunction, obesity, and pain.  ACTIVITY LIMITATIONS: carrying, lifting, bending, sitting, standing, squatting, sleeping, stairs, transfers, bed mobility, and locomotion level  PARTICIPATION LIMITATIONS: meal prep, cleaning, driving, community activity, and church  PERSONAL FACTORS: Fitness, Past/current experiences, Time since onset of injury/illness/exacerbation, and 3+ comorbidities: see PMH  are also affecting patient's functional outcome.   REHAB POTENTIAL: Good  CLINICAL DECISION MAKING: Stable/uncomplicated  EVALUATION COMPLEXITY: Low   GOALS: Goals reviewed with patient? Yes  SHORT TERM GOALS: (target date for Short term goals 11/10/2022)   1.  Patient will demonstrate independent use of home exercise program to maintain progress from in clinic treatments. Baseline: see objective data Goal status:  MET 10/31/2022  2. PROM right knee ext -5* and flexion 90* Baseline: see objective data Goal status: MET 10/31/2022  3. Interim FOTO >32% Baseline: see objective data  Goal status:  MET 10/31/2022  LONG TERM GOALS: (target dates for all long term goals  12/29/2022 )   1. Patient will demonstrate/report pain at worst less than or equal to 2/10 to facilitate minimal limitation in daily activity secondary to pain  symptoms. Baseline: see objective data Goal status: Ongoing 11/06/2022   2. Patient will demonstrate independent use of home exercise program to facilitate ability to maintain/progress functional gains from skilled physical therapy services. Baseline: see objective data Goal status: Ongoing 11/06/2022   3. Patient will demonstrate FOTO outcome > or = 47 % to indicate reduced disability due to condition. Baseline: see objective data Goal status:  MET 10/31/2022   4.  Patient will demonstrate right knee MMT 4/5 throughout to faciltiate usual transfers, stairs, squatting at [facility] for daily life.  Baseline: see objective data Goal status: Ongoing 11/06/2022   5.  Patient right knee AROM ext standing -2* and flexion seated or supine 100* Baseline: see objective data Goal status: Ongoing 11/06/2022   6.  patient amb with LRAD >500' and negotiates ramps/curbs/stairs modified independent.  Baseline: see objective data Goal status: Ongoing 11/06/2022   PLAN:  PT FREQUENCY:  2-3x/wk  PT DURATION: 12 weeks  PLANNED INTERVENTIONS: Therapeutic exercises, Therapeutic activity, Neuro Muscular re-education, Balance training, Gait training, Patient/Family education, Joint mobilization, Stair training, DME instructions, Dry Needling, Electrical stimulation, Traction, Cryotherapy, vasopneumatic deviceMoist heat, Taping, Ultrasound, Ionotophoresis 4mg /ml Dexamethasone, and aquatic therapy, Manual therapy.  All included unless contraindicated  PLAN FOR NEXT SESSION:   Follow up from MD office visit.  Progressive manual and intervention for mobility gains.  Encourage longer duration stretching for further gains.    Chyrel Masson, PT, DPT, OCS, ATC 11/08/22  12:23 PM

## 2022-11-13 ENCOUNTER — Encounter: Payer: Self-pay | Admitting: Physical Therapy

## 2022-11-13 ENCOUNTER — Ambulatory Visit: Payer: Medicare Other | Admitting: Physical Therapy

## 2022-11-13 DIAGNOSIS — R2689 Other abnormalities of gait and mobility: Secondary | ICD-10-CM

## 2022-11-13 DIAGNOSIS — R600 Localized edema: Secondary | ICD-10-CM | POA: Diagnosis not present

## 2022-11-13 DIAGNOSIS — M6281 Muscle weakness (generalized): Secondary | ICD-10-CM

## 2022-11-13 DIAGNOSIS — R2681 Unsteadiness on feet: Secondary | ICD-10-CM

## 2022-11-13 DIAGNOSIS — M25661 Stiffness of right knee, not elsewhere classified: Secondary | ICD-10-CM

## 2022-11-13 DIAGNOSIS — M25561 Pain in right knee: Secondary | ICD-10-CM

## 2022-11-13 NOTE — Therapy (Signed)
OUTPATIENT PHYSICAL THERAPY  TREATMENT   Patient Name: Jessica [name] MRN: [id number] DOB:July 25, 1950, 72 y.o., female Today's Date: 11/13/2022   END OF SESSION:  PT End of Session - 11/13/22 1344     Visit Number 14    Number of Visits 27    Date for PT Re-Evaluation 12/29/22    Authorization Type [employer] [facility]    Progress Note Due on Visit 20    PT Start Time 1344    PT Stop Time 1440    PT Time Calculation (min) 56 min    Activity Tolerance Patient limited by pain    Behavior During Therapy [facility] for tasks assessed/performed                     Past Medical History:  Diagnosis Date   Anxiety    Arthritis    bilateral knees   Asthma    childhood, inhaler for wheezing PRN   Autonomic neuropathy    diabetic   Cataract    Celiac disease    Constipation    Depression    Diabetes mellitus    type II, Hemoglobin A1C 9.9 10/05/2011   Diabetic neuropathy (HCC)    Diverticulosis    Fatty liver    Gastroparesis    GERD (gastroesophageal reflux disease)    History of claustrophobia    with MRI tests   Hyperlipidemia    Hypertension    IBS (irritable bowel syndrome)    Kidney stones    Personal history of colonic polyps 03/2010   hyperplastic   PONV (postoperative nausea and vomiting)    Shingles 2021   Past Surgical History:  Procedure Laterality Date   APPENDECTOMY     CATARACT EXTRACTION Right 04/29/2018   CATARACT EXTRACTION Left 03/2018   COLONOSCOPY     DILATATION & CURRETTAGE/HYSTEROSCOPY WITH RESECTOCOPE N/A 03/24/2013   Procedure: DILATATION & CURETTAGE, HYSTEROSCOPY WITH RESECTION;  Surgeon: Levi Aland, MD;  Location: [facility];  Service: Gynecology;  Laterality: N/A;   HYSTEROSCOPY WITH D & C N/A 11/08/2015   Procedure: DILATATION AND CURETTAGE /HYSTEROSCOPY;  Surgeon: Levi Aland, MD;  Location: [facility];  Service: Gynecology;  Laterality: N/A;   left breast cyst removal  2000   LEFT HEART CATH AND CORONARY ANGIOGRAPHY N/A 10/31/2018    Procedure: LEFT HEART CATH AND CORONARY ANGIOGRAPHY;  Surgeon: Corky Crafts, MD;  Location: [facility] INVASIVE CV LAB;  Service: Cardiovascular;  Laterality: N/A;   TOTAL KNEE ARTHROPLASTY Right 09/12/2022   Procedure: RIGHT TOTAL KNEE ARTHROPLASTY;  Surgeon: Kathryne Hitch, MD;  Location: [facility] OR;  Service: Orthopedics;  Laterality: Right;   TUBAL LIGATION     Patient Active Problem List   Diagnosis Date Noted   Status post total right knee replacement 09/12/2022   Degenerative arthritis of left knee 08/07/2022   Chronic rhinitis 04/18/2022   Gastroesophageal reflux disease 04/18/2022   Vitamin D deficiency 04/13/2022   Morbid obesity (HCC) 04/13/2022   Peripheral sensory neuropathy due to type 2 diabetes mellitus (HCC) 04/05/2020   Bilateral lower extremity edema 01/20/2020   Family history of malignant neoplasm of endometrium 10/08/2018   Celiac disease 02/14/2018   Osteopenia 02/14/2018   Mild intermittent asthma 02/14/2018   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) 02/14/2018   Class 2 obesity due to excess calories with body mass index (BMI) of 37.0 to 37.9 in adult 02/14/2018   Fibroma of tongue 06/06/2016   Renal  calculi 01/13/2016   Degenerative cervical disc 07/27/2015   OSA (obstructive sleep apnea) 02/28/2011   Constipation, slow transit 09/23/2010   Type 2 diabetes mellitus with complication, with long-term current use of insulin (HCC) 09/23/2010   FH: colon cancer 09/23/2010   Personal history of colonic polyps 09/23/2010   Combined hyperlipidemia associated with type 2 diabetes mellitus (HCC) 01/29/2009   Diabetic gastroparesis (HCC) 02/27/2007   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 Mardelle Matte, MD   REFERRING PROVIDER: Vanita Panda. Magnus Ivan, MD  REFERRING DIAG: [phone] (ICD-10-CM) - Status post total right knee replacement    THERAPY DIAG:  Stiffness of right knee, not elsewhere classified  Acute pain of right knee  Muscle weakness (generalized)  Localized edema  Other abnormalities of gait and mobility  Unsteadiness on feet  Rationale for Evaluation and Treatment: Rehabilitation  ONSET DATE: 09/12/2022  SUBJECTIVE:   SUBJECTIVE STATEMENT: She saw Dr. Magnus Ivan who is going to do a manipulation this Thursday. She has been upset this weekend worrying about it.   PERTINENT HISTORY: Right TKA 09/12/2022, DM2, asthma, OA, peripheral neuropathy, osteopenia, shingles  PAIN:  NPRS scale: upon arrival 5/10, best 2/01 & at worst 8/10 Pain location: right knee around joint line and in popliteal area.  Pain description: throbbing Aggravating factors: riding in car, inactivity Relieving factors: Tylenol, ice  PRECAUTIONS: Fall  WEIGHT BEARING RESTRICTIONS: No  FALLS:  Has patient fallen in last 6 months? No  LIVING ENVIRONMENT: Lives with: lives with their spouse Lives in: [place] with master handicap accessible bath & bedroom ramped entrance. Does not go upstairs.  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   PATIENT SURVEYS:  10/31/2022: FOTO visit 10 52%  Eval: FOTO intake:  21%  predicted:  47%  COGNITION:10/11/2022 Overall cognitive status: WFL    SENSATION: 10/11/2022 WFL  EDEMA:  10/11/2022 Circumferential:  LLE: above knee 51.5cm,  around knee 50.2 cm, below knee 42.5 cm RLE: above knee 55.5 cm,  around knee 53.8  cm, below knee 46.2 cm  POSTURE: 10/11/2022  rounded shoulders, forward head, flexed trunk , and weight shift left  PALPATION: 10/11/2022 Right knee tenderness along joint line & incision.   LOWER EXTREMITY ROM:   ROM Right eval Right 10/13/22 Right 10/16/22 Right 10/25/22 Right 10/31/22 Right 11/08/2022  Hip flexion        Hip extension        Hip abduction        Hip adduction        Hip internal rotation        Hip external rotation        Knee flexion Seated A: 64* P: 74* Active 75 AA 84* Seated P: 87* A: 77* Seated P: 90* A:83* Supine AROM heel slide: 80     Knee extension Seated A: LAQ -22* Seated with LE extended P: -11* Active -7 Supine A: -5*  Supine P: -1* A: quad set -3* SAQ -6* Seated AROM LAQ: -  14  Supine quad set AROM -6  Ankle dorsiflexion        Ankle plantarflexion        Ankle inversion        Ankle eversion         (Blank rows = not tested)  LOWER EXTREMITY MMT:  MMT Right 10/11/2022 Right 11/08/2022  Hip flexion    Hip extension    Hip abduction    Hip adduction    Hip internal rotation    Hip external rotation    Knee flexion 3-/5 5/5 in available range  Knee extension 3-/5 4-+/5 in available range  Ankle dorsiflexion    Ankle plantarflexion    Ankle inversion    Ankle eversion     (Blank rows = not tested)  FUNCTIONAL TESTS:  11/08/2022:  24 seconds with FWW  10/11/2022 18 inch chair transfer: requires BUE support to arise from 22" w/c to RW for stabilization TUG with RW: 41.75 sec  GAIT: 11/08/2022:  Ambulation into clinic c FWW.   10/11/2022 Distance walked: 20' Assistive device utilized: Environmental consultant - 2 wheeled Level of assistance: SBA Comments: Antalgic gait pattern with decreased stance duration right lower extremity, right knee flexed in stance and minimal increase flexion for swing phase and flexed trunk.                     TODAY'S TREATMENT                                   DATE: 11/13/2022: Therapeutic  Exercise: SciFit seat 111 full revolutions  level 2 BLEs/BUEs for 4 min, BLEs only level 1 for 4 min Leg press DL 161# 0R60, Rt leg only 50# 2X10 Gastroc stretch forefoot on step heel depression 30 sec hold 3 reps Heel raises 10 reps 2 sets Tandem stance on foam with RLE in front & in back 30 sec ea without touching.  Rocker board ant/post with knee flexion / ext 10 reps with BUE light support then 10 reps with PT minA / no UE support.  Hamstring stretch RLE long sit 30 sec 2 reps Quad stretch hooklying RLE over edge with strap knee flexion 30 sec hold 2 reps Standing with right foot on edge of 18" chair holding 2 chair backs for support / balance. Knee flexion stretch with forward lean 10 sec hold 3 reps 3 sets moving stance LE 1" closer ea set for increase stretch. Bridge BLEs with PT increasing RLE knee flexion on 3rd & 5th rep - 7 reps total 2 sets  Vaso supine elevation with towel roll under heel for knee ext 34* medium compression 10 min   TREATMENT                                   DATE: 11/08/2022: Therapeutic Exercise: SciFit seat 12 full revolutions  level 2 UE/LE 8 mins Seated tailgate flexion swinging 2 mins Supine Rt AROM 5 sec hold x 5 Rt seated LAQ x 5 Leg press DL 454# x 25 , Rt leg only 50# x 20  TherActivity TUG x 1 c FWW SPC use in clinic household distances with mild cues at times for sequencing.  Performed 30 ft. 60 ft, 60 ft c supervision.   Manual therapy Supine Rt knee flexion c mobilization c movement IR/Distraction, contract relax sequencing  for flexion stretching.   Vasopneumatic Device Supine elevation with towel roll under heel for knee ext 34* medium compression 10 min  TODAY'S TREATMENT                                   DATE:11/06/2022: Therapeutic Exercise: SciFit seat 12 full revolutions  level 2 BLEs/BUEs for 4 min, BLEs only level 1 for 4 min Leg press DL 063# 0Z60, Rt leg only 50# 2X10 Gastroc stretch forefoot on towel roll with wt shift on RLE 30  sec hold 3 reps Heel raises 10 reps 2 sets Tandem stance on floor with RLE in front & in back 30 sec ea without touching. Sink & chair back for safety. Hamstring stretch RLE long sit 30 sec 2 reps Quad stretch hooklying RLE over edge with strap knee flexion 30 sec hold 2 reps PT added muscle stretches, tandem stance and heel raises to HEP with HO, demo & verbal cues. Pt verbalized & return demo.   Gait Training:  Pt amb 100' x 2 with cane safely.  Manual therapy Contract-relax seated knee flexion.  PROM with gentle overpressure  Vaso supine elevation with towel roll under heel for knee ext 34* medium compression 10 min    HOME EXERCISE PROGRAM: Access Code: [email or web address] URL: [email or web address] Date: 11/06/2022 Prepared by: Vladimir Faster  Exercises - Ankle Alphabet in Elevation  - 2-4 x daily - 7 x weekly - 1 sets - 1 reps - supine quad set with towel roll under ankle  - 2-4 x daily - 7 x weekly - 2 sets - 10 reps - 5 seconds hold - Supine Heel Slide with Strap  - 2-3 x daily - 7 x weekly - 2-3 sets - 10 reps - 5 seconds hold - Seated Knee Flexion Extension AROM   - 2-4 x daily - 7 x weekly - 2-3 sets - 10 reps - 5 seconds hold - Supine Knee Extension Strengthening  - 2-4 x daily - 7 x weekly - 2-3 sets - 10 reps - 5 seconds hold - Seated Long Arc Quad  - 2-4 x daily - 7 x weekly - 2-3 sets - 10 reps - 5 seconds hold - standing calf stretch with forefoot on small step or brick  - 1 x daily - 7 x weekly - 1 sets - 3 reps - 30 seconds hold - Seated Table Hamstring Stretch  - 1 x daily - 7 x weekly - 1 sets - 3 reps - 30 seconds hold - Supine Quadriceps Stretch with Strap on Table  - 1 x daily - 7 x weekly - 1 sets - 3 reps - 30 seconds hold - Standing Tandem Balance with Counter Support  - 1 x daily - 7 x weekly - 1 sets - 2 reps - 30 seconds hold - Heel raises near counter  - 1 x daily - 7 x weekly - 2 sets - 10 reps - 5 seconds hold  ASSESSMENT: CLINICAL  IMPRESSION: PT working on muscle flexibility and some strengthening prior to scheduled manipulation later this week.    OBJECTIVE IMPAIRMENTS: Abnormal gait, decreased activity tolerance, decreased balance, decreased endurance, decreased knowledge of condition, decreased knowledge of use of DME, decreased mobility, difficulty walking, decreased ROM, decreased strength, increased edema, postural dysfunction, obesity, and pain.   ACTIVITY LIMITATIONS: carrying, lifting, bending, sitting, standing, squatting, sleeping, stairs, transfers, bed mobility, and locomotion level  PARTICIPATION  LIMITATIONS: meal prep, cleaning, driving, community activity, and church  PERSONAL FACTORS: Fitness, Past/current experiences, Time since onset of injury/illness/exacerbation, and 3+ comorbidities: see PMH  are also affecting patient's functional outcome.   REHAB POTENTIAL: Good  CLINICAL DECISION MAKING: Stable/uncomplicated  EVALUATION COMPLEXITY: Low   GOALS: Goals reviewed with patient? Yes  SHORT TERM GOALS: (target date for Short term goals 11/10/2022)   1.  Patient will demonstrate independent use of home exercise program to maintain progress from in clinic treatments. Baseline: see objective data Goal status:  MET 10/31/2022  2. PROM right knee ext -5* and flexion 90* Baseline: see objective data Goal status: MET 10/31/2022  3. Interim FOTO >32% Baseline: see objective data  Goal status:  MET 10/31/2022  LONG TERM GOALS: (target dates for all long term goals  12/29/2022 )   1. Patient will demonstrate/report pain at worst less than or equal to 2/10 to facilitate minimal limitation in daily activity secondary to pain symptoms. Baseline: see objective data Goal status: Ongoing 11/06/2022   2. Patient will demonstrate independent use of home exercise program to facilitate ability to maintain/progress functional gains from skilled physical therapy services. Baseline: see objective data Goal status:  Ongoing 11/06/2022   3. Patient will demonstrate FOTO outcome > or = 47 % to indicate reduced disability due to condition. Baseline: see objective data Goal status:  MET 10/31/2022   4.  Patient will demonstrate right knee MMT 4/5 throughout to faciltiate usual transfers, stairs, squatting at [facility] for daily life.  Baseline: see objective data Goal status: Ongoing 11/06/2022   5.  Patient right knee AROM ext standing -2* and flexion seated or supine 100* Baseline: see objective data Goal status: Ongoing 11/06/2022   6.  patient amb with LRAD >500' and negotiates ramps/curbs/stairs modified independent.  Baseline: see objective data Goal status: Ongoing 11/06/2022   PLAN:  PT FREQUENCY:  2-3x/wk  PT DURATION: 12 weeks  PLANNED INTERVENTIONS: Therapeutic exercises, Therapeutic activity, Neuro Muscular re-education, Balance training, Gait training, Patient/Family education, Joint mobilization, Stair training, DME instructions, Dry Needling, Electrical stimulation, Traction, Cryotherapy, vasopneumatic deviceMoist heat, Taping, Ultrasound, Ionotophoresis 4mg /ml Dexamethasone, and aquatic therapy, Manual therapy.  All included unless contraindicated  PLAN FOR NEXT SESSION:  do recertification for daily PT for 2 weeks following manipulation, then 2-3 x/ wk.  Check ROM, manual therapy and exercise for ROM post manipulation, vaso for edema.    Vladimir Faster, PT, DPT 11/13/2022, 3:54 PM

## 2022-11-14 ENCOUNTER — Ambulatory Visit: Payer: Medicare Other | Admitting: Bariatrics

## 2022-11-15 ENCOUNTER — Encounter: Payer: Medicare Other | Admitting: Physical Therapy

## 2022-11-16 ENCOUNTER — Ambulatory Visit (INDEPENDENT_AMBULATORY_CARE_PROVIDER_SITE_OTHER): Payer: Medicare Other | Admitting: Physical Therapy

## 2022-11-16 ENCOUNTER — Other Ambulatory Visit: Payer: Self-pay | Admitting: Orthopaedic Surgery

## 2022-11-16 ENCOUNTER — Encounter: Payer: Self-pay | Admitting: Physical Therapy

## 2022-11-16 DIAGNOSIS — M25561 Pain in right knee: Secondary | ICD-10-CM

## 2022-11-16 DIAGNOSIS — M25661 Stiffness of right knee, not elsewhere classified: Secondary | ICD-10-CM | POA: Diagnosis not present

## 2022-11-16 DIAGNOSIS — M6281 Muscle weakness (generalized): Secondary | ICD-10-CM | POA: Diagnosis not present

## 2022-11-16 DIAGNOSIS — M24661 Ankylosis, right knee: Secondary | ICD-10-CM | POA: Diagnosis not present

## 2022-11-16 DIAGNOSIS — R600 Localized edema: Secondary | ICD-10-CM

## 2022-11-16 DIAGNOSIS — R2689 Other abnormalities of gait and mobility: Secondary | ICD-10-CM

## 2022-11-16 DIAGNOSIS — R2681 Unsteadiness on feet: Secondary | ICD-10-CM

## 2022-11-16 MED ORDER — HYDROCODONE-ACETAMINOPHEN 5-325 MG PO TABS: 1.0000 | ORAL_TABLET | Freq: Four times a day (QID) | ORAL | 0 refills | Status: DC | PRN

## 2022-11-16 NOTE — Therapy (Signed)
OUTPATIENT PHYSICAL THERAPY  TREATMENT   Patient Name: Jessica [name] MRN: [id number] DOB:[birth year], 72 y.o., female Today's Date: 11/17/2022   END OF SESSION:  PT End of Session - 11/17/22 1339     Visit Number 16    Number of Visits 27    Date for PT Re-Evaluation 12/21/22    Authorization Type [employer] [facility]    Progress Note Due on Visit 20    PT Start Time 1345    PT Stop Time 1424    PT Time Calculation (min) 39 min    Activity Tolerance Patient tolerated treatment well;No increased pain    Behavior During Therapy [facility] for tasks assessed/performed                       Past Medical History:  Diagnosis Date   Anxiety    Arthritis    bilateral knees   Asthma    childhood, inhaler for wheezing PRN   Autonomic neuropathy    diabetic   Cataract    Celiac disease    Constipation    Depression    Diabetes mellitus    type II, Hemoglobin A1C 9.9 10/05/2011   Diabetic neuropathy (HCC)    Diverticulosis    Fatty liver    Gastroparesis    GERD (gastroesophageal reflux disease)    History of claustrophobia    with MRI tests   Hyperlipidemia    Hypertension    IBS (irritable bowel syndrome)    Kidney stones    Personal history of colonic polyps 03/2010   hyperplastic   PONV (postoperative nausea and vomiting)    Shingles 2021   Past Surgical History:  Procedure Laterality Date   APPENDECTOMY     CATARACT EXTRACTION Right 04/29/2018   CATARACT EXTRACTION Left 03/2018   COLONOSCOPY     DILATATION & CURRETTAGE/HYSTEROSCOPY WITH RESECTOCOPE N/A 03/24/2013   Procedure: DILATATION & CURETTAGE, HYSTEROSCOPY WITH RESECTION;  Surgeon: Levi Aland, MD;  Location: [facility];  Service: Gynecology;  Laterality: N/A;   HYSTEROSCOPY WITH D & C N/A 11/08/2015   Procedure: DILATATION AND CURETTAGE /HYSTEROSCOPY;  Surgeon: Levi Aland, MD;  Location: [facility];  Service: Gynecology;  Laterality: N/A;   left breast cyst removal  2000   LEFT HEART CATH AND CORONARY  ANGIOGRAPHY N/A 10/31/2018   Procedure: LEFT HEART CATH AND CORONARY ANGIOGRAPHY;  Surgeon: Corky Crafts, MD;  Location: [facility] INVASIVE CV LAB;  Service: Cardiovascular;  Laterality: N/A;   TOTAL KNEE ARTHROPLASTY Right 09/12/2022   Procedure: RIGHT TOTAL KNEE ARTHROPLASTY;  Surgeon: Kathryne Hitch, MD;  Location: [facility];  Service: Orthopedics;  Laterality: Right;   TUBAL LIGATION     Patient Active Problem List   Diagnosis Date Noted   Status post total right knee replacement 09/12/2022   Degenerative arthritis of left knee 08/07/2022   Chronic rhinitis 04/18/2022   Gastroesophageal reflux disease 04/18/2022   Vitamin D deficiency 04/13/2022   Morbid obesity (HCC) 04/13/2022   Peripheral sensory neuropathy due to type 2 diabetes mellitus (HCC) 04/05/2020   Bilateral lower extremity edema 01/20/2020   Family history of malignant neoplasm of endometrium 10/08/2018   Celiac disease 02/14/2018   Osteopenia 02/14/2018   Mild intermittent asthma 02/14/2018   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) 02/14/2018   Class 2 obesity due to excess calories with body mass index (BMI) of 37.0 to 37.9 in adult 02/14/2018   Fibroma of tongue  06/06/2016   Renal calculi 01/13/2016   Degenerative cervical disc 07/27/2015   OSA (obstructive sleep apnea) 02/28/2011   Constipation, slow transit 09/23/2010   Type 2 diabetes mellitus with complication, with long-term current use of insulin (HCC) 09/23/2010   FH: colon cancer 09/23/2010   Personal history of colonic polyps 09/23/2010   Combined hyperlipidemia associated with type 2 diabetes mellitus (HCC) 01/29/2009   Diabetic gastroparesis (HCC) 02/27/2007   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 Mardelle Matte, MD   REFERRING PROVIDER: Vanita Panda. Magnus Ivan, MD  REFERRING DIAG: [id number] (ICD-10-CM) - Status post total  right knee replacement   THERAPY DIAG:  Stiffness of right knee, not elsewhere classified  Acute pain of right knee  Muscle weakness (generalized)  Localized edema  Other abnormalities of gait and mobility  Rationale for Evaluation and Treatment: Rehabilitation  ONSET DATE: 09/12/2022  SUBJECTIVE:   SUBJECTIVE STATEMENT: 11/17/2022 A little bit of soreness and pain today (~2/10) but overall reports feeling much better than yesterday. Has been icing at home, voices strong motivation to improve mobility   PERTINENT HISTORY: Right TKA 09/12/2022, DM2, asthma, OA, peripheral neuropathy, osteopenia, shingles  PAIN:  NPRS scale: upon arrival 6/10 Pain location: R knee going up to hip "It feels weak"  Pain description: weak, "not a happy"  Aggravating factors: "hard to answer"  Relieving factors: Tylenol, ice  PRECAUTIONS: Fall  WEIGHT BEARING RESTRICTIONS: No  FALLS:  Has patient fallen in last 6 months? No  LIVING ENVIRONMENT: Lives with: lives with their spouse Lives in: [place] with master handicap accessible bath & bedroom ramped entrance. Does not go upstairs.  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   PATIENT SURVEYS:  10/31/2022: FOTO visit 10 52%  Eval: FOTO intake:  21%  predicted:  47%  COGNITION:10/11/2022 Overall cognitive status: WFL    SENSATION: 10/11/2022 WFL  EDEMA:  10/11/2022 Circumferential:  LLE: above knee 51.5cm,  around knee 50.2 cm, below knee 42.5 cm RLE: above knee  55.5 cm,  around knee 53.8 cm, below knee 46.2 cm  POSTURE: 10/11/2022  rounded shoulders, forward head, flexed trunk , and weight shift left  PALPATION: 10/11/2022 Right knee tenderness along joint line & incision.   LOWER EXTREMITY ROM:   ROM Right eval Right 10/13/22 Right 10/16/22 Right 10/25/22 Right 10/31/22 Right 11/08/2022 Right 11/16/22 Right 11/17/22 PROM  Hip flexion          Hip extension          Hip abduction          Hip adduction          Hip internal rotation          Hip external rotation          Knee flexion Seated A: 64* P: 74* Active 75 AA 84* Seated P: 87* A: 77* Seated P: 90* A:83* Supine AROM heel slide: 80    Supine and sitting 90*  Supine 87 deg  Sitting 99 deg s but not pain  Knee extension Seated A: LAQ -22* Seated with LE extended P: -11* Active -7 Supine A: -5*  Supine P: -1* A: quad set -3* SAQ -6* Seated AROM LAQ: -14  Supine quad set AROM -6 Seated with heel 14*, with overpressure 12*   Ankle dorsiflexion          Ankle plantarflexion          Ankle inversion          Ankle eversion           (Blank rows = not tested)  LOWER EXTREMITY MMT:  MMT Right 10/11/2022 Right 11/08/2022 Right 11/16/22  Hip flexion     Hip extension     Hip abduction     Hip adduction     Hip internal rotation     Hip external rotation     Knee flexion 3-/5 5/5 in available range   Knee extension 3-/5 4-+/5 in available range 5 in available ROM  Ankle dorsiflexion     Ankle plantarflexion     Ankle inversion     Ankle eversion      (Blank rows = not tested)  FUNCTIONAL TESTS:  11/08/2022:  24 seconds with FWW  10/11/2022 18 inch chair transfer: requires BUE support to arise from 22" w/c to RW for stabilization TUG with RW: 41.75 sec  GAIT: 11/08/2022:  Ambulation into clinic c FWW.   10/11/2022 Distance walked: 20' Assistive device utilized: Environmental consultant - 2 wheeled Level of assistance: SBA Comments: Antalgic gait pattern with decreased stance  duration right lower extremity, right knee flexed in stance and minimal increase flexion for swing phase and flexed trunk.                     TODAY'S TREATMENT   [facility] Adult PT Treatment:                                                DATE: 11/17/22 Therapeutic Exercise: LAQ assisted w/ contralateral limb 2x10 cues for setup Knee flexion AAROM (contralateral limb assist) 2x10 Standing mini lunge on 2inch step in // bars for flexion ROM 2x8  Verbal HEP review  Manual Therapy: Seated; passive physiological movement knee flex/ext RLE to pt tolerance, oscillations to reduce muscle guarding, gentle distraction to improve flexion with ROM Supine; passive physiological movement knee flex/ext RLE to pt tolerance   11/15/22  FOTO and ROM/MMT check after manipulation   Manual  Knee flexion overpressure mixed with tennis ball STM to help relax quad multiple rounds  TherEx   Knee extension stretch LE on chair x4 minutes 0# LAQs 0# x15     Education  General presentation after manipulation, edema and quad inhibition after new manipulation, POC following manipulation, lots of encouragement to continue with PT and HEP/exercise routine especially following manipulation today     11/13/2022: Therapeutic Exercise: SciFit seat 111 full revolutions  level 2 BLEs/BUEs for 4 min, BLEs only level 1 for 4 min Leg press DL 425# 9D63, Rt leg only 50# 2X10 Gastroc stretch forefoot on step heel depression 30 sec hold 3 reps Heel raises 10 reps 2 sets Tandem stance on foam with RLE in front & in back 30 sec ea without touching.  Rocker board ant/post with knee flexion / ext 10 reps with BUE  light support then 10 reps with PT minA / no UE support.  Hamstring stretch RLE long sit 30 sec 2 reps Quad stretch hooklying RLE over edge with strap knee flexion 30 sec hold 2 reps Standing with right foot on edge of 18" chair holding 2 chair backs for support / balance. Knee flexion stretch with forward lean  10 sec hold 3 reps 3 sets moving stance LE 1" closer ea set for increase stretch. Bridge BLEs with PT increasing RLE knee flexion on 3rd & 5th rep - 7 reps total 2 sets  Vaso supine elevation with towel roll under heel for knee ext 34* medium compression 10 min   TREATMENT                                   DATE: 11/08/2022: Therapeutic Exercise: SciFit seat 12 full revolutions  level 2 UE/LE 8 mins Seated tailgate flexion swinging 2 mins Supine Rt AROM 5 sec hold x 5 Rt seated LAQ x 5 Leg press DL 098# x 25 , Rt leg only 50# x 20  TherActivity TUG x 1 c FWW SPC use in clinic household distances with mild cues at times for sequencing.  Performed 30 ft. 60 ft, 60 ft c supervision.   Manual therapy Supine Rt knee flexion c mobilization c movement IR/Distraction, contract relax sequencing for flexion stretching.   Vasopneumatic Device Supine elevation with towel roll under heel for knee ext 34* medium compression 10 min  TODAY'S TREATMENT                                   DATE:11/06/2022: Therapeutic Exercise: SciFit seat 12 full revolutions  level 2 BLEs/BUEs for 4 min, BLEs only level 1 for 4 min Leg press DL 119# 1Y78, Rt leg only 50# 2X10 Gastroc stretch forefoot on towel roll with wt shift on RLE 30 sec hold 3 reps Heel raises 10 reps 2 sets Tandem stance on floor with RLE in front & in back 30 sec ea without touching. Sink & chair back for safety. Hamstring stretch RLE long sit 30 sec 2 reps Quad stretch hooklying RLE over edge with strap knee flexion 30 sec hold 2 reps PT added muscle stretches, tandem stance and heel raises to HEP with HO, demo & verbal cues. Pt verbalized & return demo.   Gait Training:  Pt amb 100' x 2 with cane safely.  Manual therapy Contract-relax seated knee flexion.  PROM with gentle overpressure  Vaso supine elevation with towel roll under heel for knee ext 34* medium compression 10 min    HOME EXERCISE PROGRAM: Access Code: [email or web address] URL:  [email or web address] Date: 11/06/2022 Prepared by: Vladimir Faster  Exercises - Ankle Alphabet in Elevation  - 2-4 x daily - 7 x weekly - 1 sets - 1 reps - supine quad set with towel roll under ankle  - 2-4 x daily - 7 x weekly - 2 sets - 10 reps - 5 seconds hold - Supine Heel Slide with Strap  - 2-3 x daily - 7 x weekly - 2-3 sets - 10 reps - 5 seconds hold - Seated Knee Flexion Extension AROM   - 2-4 x daily - 7 x weekly - 2-3 sets - 10 reps - 5 seconds hold - Supine Knee Extension Strengthening  - 2-4 x daily - 7 x weekly -  2-3 sets - 10 reps - 5 seconds hold - Seated Long Arc Quad  - 2-4 x daily - 7 x weekly - 2-3 sets - 10 reps - 5 seconds hold - standing calf stretch with forefoot on small step or brick  - 1 x daily - 7 x weekly - 1 sets - 3 reps - 30 seconds hold - Seated Table Hamstring Stretch  - 1 x daily - 7 x weekly - 1 sets - 3 reps - 30 seconds hold - Supine Quadriceps Stretch with Strap on Table  - 1 x daily - 7 x weekly - 1 sets - 3 reps - 30 seconds hold - Standing Tandem Balance with Counter Support  - 1 x daily - 7 x weekly - 1 sets - 2 reps - 30 seconds hold - Heel raises near counter  - 1 x daily - 7 x weekly - 2 sets - 10 reps - 5 seconds hold  ASSESSMENT: CLINICAL IMPRESSION: Pt arrives w/ ~2/10 pain, improved from yesterday but still has some soreness. Much of today spent with manual which pt tolerates quite well, noted significant improvement in knee flexion ROM in sitting position although more limited in supine. Following manual with therex to maximize tissue extensibility in open and closed chain, focusing on flexion. No adverse events, reports improving symptoms with repetition during activities. Encouraging icing at home to manage expected swelling after manipulation. Pt departs today's session in no acute distress, all voiced questions/concerns addressed appropriately from PT perspective.      OBJECTIVE IMPAIRMENTS: Abnormal gait, decreased activity  tolerance, decreased balance, decreased endurance, decreased knowledge of condition, decreased knowledge of use of DME, decreased mobility, difficulty walking, decreased ROM, decreased strength, increased edema, postural dysfunction, obesity, and pain.   ACTIVITY LIMITATIONS: carrying, lifting, bending, sitting, standing, squatting, sleeping, stairs, transfers, bed mobility, and locomotion level  PARTICIPATION LIMITATIONS: meal prep, cleaning, driving, community activity, and church  PERSONAL FACTORS: Fitness, Past/current experiences, Time since onset of injury/illness/exacerbation, and 3+ comorbidities: see PMH  are also affecting patient's functional outcome.   REHAB POTENTIAL: Good  CLINICAL DECISION MAKING: Stable/uncomplicated  EVALUATION COMPLEXITY: Low   GOALS: Goals reviewed with patient? Yes  SHORT TERM GOALS: (target date for Short term goals 11/10/2022)   1.  Patient will demonstrate independent use of home exercise program to maintain progress from in clinic treatments. Baseline: see objective data Goal status:  MET 10/31/2022  2. PROM right knee ext -5* and flexion 90* Baseline: see objective data Goal status: MET 10/31/2022  3. Interim FOTO >32% Baseline: see objective data  Goal status:  MET 10/31/2022  LONG TERM GOALS: (target dates for all long term goals  12/21/2022 ) (for updated cert s/p manipulation)   1. Patient will demonstrate/report pain at worst less than or equal to 2/10 to facilitate minimal limitation in daily activity secondary to pain symptoms. Baseline: see objective data Goal status: Ongoing 11/06/2022   2. Patient will demonstrate independent use of home exercise program to facilitate ability to maintain/progress functional gains from skilled physical therapy services. Baseline: see objective data Goal status: Ongoing 11/06/2022   3. Patient will demonstrate FOTO outcome > or = 47 % to indicate reduced disability due to condition. Baseline: see  objective data Goal status:  MET 10/31/2022   4.  Patient will demonstrate right knee MMT 4/5 throughout to faciltiate usual transfers, stairs, squatting at [facility] for daily life.  Baseline: see objective data Goal status: Ongoing 11/06/2022   5.  Patient  right knee AROM ext standing -2* and flexion seated or supine 100* Baseline: see objective data Goal status: Ongoing 11/06/2022   6.  patient amb with LRAD >500' and negotiates ramps/curbs/stairs modified independent.  Baseline: see objective data Goal status: Ongoing 11/06/2022   PLAN:  PT FREQUENCY:  5x/week for 2 weeks, then drop to 2-3x/week for an additional 3 weeks   PT DURATION: 5 weeks   PLANNED INTERVENTIONS: Therapeutic exercises, Therapeutic activity, Neuro Muscular re-education, Balance training, Gait training, Patient/Family education, Joint mobilization, Stair training, DME instructions, Dry Needling, Electrical stimulation, Traction, Cryotherapy, vasopneumatic deviceMoist heat, Taping, Ultrasound, Ionotophoresis 4mg /ml Dexamethasone, and aquatic therapy, Manual therapy.  All included unless contraindicated  PLAN FOR NEXT SESSION:  Check ROM regularly , manual therapy and exercise for ROM post manipulation, vaso for edema.     Ashley Murrain PT, DPT 11/17/2022 2:27 PM

## 2022-11-16 NOTE — Therapy (Signed)
OUTPATIENT PHYSICAL THERAPY  TREATMENT/RE-ASSESS AFTER MANIPULATION   Patient Name: Jessica [name] MRN: [id number] DOB:1951/04/05, 72 y.o., female Today's Date: 11/16/2022   END OF SESSION:  PT End of Session - 11/16/22 1436     Visit Number 15    Number of Visits 27    Date for PT Re-Evaluation 12/21/22    Authorization Type [employer] [facility]    Progress Note Due on Visit 20    PT Start Time 1349    PT Stop Time 1427    PT Time Calculation (min) 38 min    Activity Tolerance Patient tolerated treatment well;Patient limited by pain    Behavior During Therapy [facility] for tasks assessed/performed                      Past Medical History:  Diagnosis Date   Anxiety    Arthritis    bilateral knees   Asthma    childhood, inhaler for wheezing PRN   Autonomic neuropathy    diabetic   Cataract    Celiac disease    Constipation    Depression    Diabetes mellitus    type II, Hemoglobin A1C 9.9 10/05/2011   Diabetic neuropathy (HCC)    Diverticulosis    Fatty liver    Gastroparesis    GERD (gastroesophageal reflux disease)    History of claustrophobia    with MRI tests   Hyperlipidemia    Hypertension    IBS (irritable bowel syndrome)    Kidney stones    Personal history of colonic polyps 03/2010   hyperplastic   PONV (postoperative nausea and vomiting)    Shingles 2021   Past Surgical History:  Procedure Laterality Date   APPENDECTOMY     CATARACT EXTRACTION Right 04/29/2018   CATARACT EXTRACTION Left 03/2018   COLONOSCOPY     DILATATION & CURRETTAGE/HYSTEROSCOPY WITH RESECTOCOPE N/A 03/24/2013   Procedure: DILATATION & CURETTAGE, HYSTEROSCOPY WITH RESECTION;  Surgeon: Levi Aland, MD;  Location: [facility];  Service: Gynecology;  Laterality: N/A;   HYSTEROSCOPY WITH D & C N/A 11/08/2015   Procedure: DILATATION AND CURETTAGE /HYSTEROSCOPY;  Surgeon: Levi Aland, MD;  Location: [facility];  Service: Gynecology;  Laterality: N/A;   left breast cyst removal   2000   LEFT HEART CATH AND CORONARY ANGIOGRAPHY N/A 10/31/2018   Procedure: LEFT HEART CATH AND CORONARY ANGIOGRAPHY;  Surgeon: Corky Crafts, MD;  Location: [facility] INVASIVE CV LAB;  Service: Cardiovascular;  Laterality: N/A;   TOTAL KNEE ARTHROPLASTY Right 09/12/2022   Procedure: RIGHT TOTAL KNEE ARTHROPLASTY;  Surgeon: Kathryne Hitch, MD;  Location: [facility];  Service: Orthopedics;  Laterality: Right;   TUBAL LIGATION     Patient Active Problem List   Diagnosis Date Noted   Status post total right knee replacement 09/12/2022   Degenerative arthritis of left knee 08/07/2022   Chronic rhinitis 04/18/2022   Gastroesophageal reflux disease 04/18/2022   Vitamin D deficiency 04/13/2022   Morbid obesity (HCC) 04/13/2022   Peripheral sensory neuropathy due to type 2 diabetes mellitus (HCC) 04/05/2020   Bilateral lower extremity edema 01/20/2020   Family history of malignant neoplasm of endometrium 10/08/2018   Celiac disease 02/14/2018   Osteopenia 02/14/2018   Mild intermittent asthma 02/14/2018   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) 02/14/2018   Class 2 obesity due to excess calories with body mass index (BMI) of 37.0 to 37.9 in adult 02/14/2018   Fibroma  of tongue 06/06/2016   Renal calculi 01/13/2016   Degenerative cervical disc 07/27/2015   OSA (obstructive sleep apnea) 02/28/2011   Constipation, slow transit 09/23/2010   Type 2 diabetes mellitus with complication, with long-term current use of insulin (HCC) 09/23/2010   FH: colon cancer 09/23/2010   Personal history of colonic polyps 09/23/2010   Combined hyperlipidemia associated with type 2 diabetes mellitus (HCC) 01/29/2009   Diabetic gastroparesis (HCC) 02/27/2007   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 Mardelle Matte, MD   REFERRING PROVIDER: Vanita Panda. Magnus Ivan, MD  REFERRING DIAG:  [phone] (ICD-10-CM) - Status post total right knee replacement   THERAPY DIAG:  Stiffness of right knee, not elsewhere classified  Acute pain of right knee  Muscle weakness (generalized)  Localized edema  Other abnormalities of gait and mobility  Unsteadiness on feet  Rationale for Evaluation and Treatment: Rehabilitation  ONSET DATE: 09/12/2022  SUBJECTIVE:   SUBJECTIVE STATEMENT:  Had the manipulation, still feeling dizzy and "off" from it. Still having pain. Want to work hard to get better especially after this manipulation  PERTINENT HISTORY: Right TKA 09/12/2022, DM2, asthma, OA, peripheral neuropathy, osteopenia, shingles  PAIN:  NPRS scale: upon arrival 6/10 Pain location: R knee going up to hip "It feels weak"  Pain description: weak, "not a happy"  Aggravating factors: "hard to answer"  Relieving factors: Tylenol, ice  PRECAUTIONS: Fall  WEIGHT BEARING RESTRICTIONS: No  FALLS:  Has patient fallen in last 6 months? No  LIVING ENVIRONMENT: Lives with: lives with their spouse Lives in: [place] with master handicap accessible bath & bedroom ramped entrance. Does not go upstairs.  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   PATIENT SURVEYS:  10/31/2022: FOTO visit 10 52%  Eval: FOTO intake:  21%  predicted:  47%  COGNITION:10/11/2022 Overall cognitive status: WFL    SENSATION: 10/11/2022 WFL  EDEMA:  10/11/2022 Circumferential:  LLE: above knee 51.5cm,  around knee 50.2 cm, below knee  42.5 cm RLE: above knee 55.5 cm,  around knee 53.8 cm, below knee 46.2 cm  POSTURE: 10/11/2022  rounded shoulders, forward head, flexed trunk , and weight shift left  PALPATION: 10/11/2022 Right knee tenderness along joint line & incision.   LOWER EXTREMITY ROM:   ROM Right eval Right 10/13/22 Right 10/16/22 Right 10/25/22 Right 10/31/22 Right 11/08/2022 Right 11/16/22  Hip flexion         Hip extension         Hip abduction         Hip adduction         Hip internal rotation         Hip external rotation         Knee flexion Seated A: 64* P: 74* Active 75 AA 84* Seated P: 87* A: 77* Seated P: 90* A:83* Supine AROM heel slide: 80    Supine and sitting 90*   Knee extension Seated A: LAQ -22* Seated with LE extended P: -  11* Active -7 Supine A: -5*  Supine P: -1* A: quad set -3* SAQ -6* Seated AROM LAQ: -14  Supine quad set AROM -6 Seated with heel 14*, with overpressure 12*  Ankle dorsiflexion         Ankle plantarflexion         Ankle inversion         Ankle eversion          (Blank rows = not tested)  LOWER EXTREMITY MMT:  MMT Right 10/11/2022 Right 11/08/2022 Right 11/16/22  Hip flexion     Hip extension     Hip abduction     Hip adduction     Hip internal rotation     Hip external rotation     Knee flexion 3-/5 5/5 in available range   Knee extension 3-/5 4-+/5 in available range 5 in available ROM  Ankle dorsiflexion     Ankle plantarflexion     Ankle inversion     Ankle eversion      (Blank rows = not tested)  FUNCTIONAL TESTS:  11/08/2022:  24 seconds with FWW  10/11/2022 18 inch chair transfer: requires BUE support to arise from 22" w/c to RW for stabilization TUG with RW: 41.75 sec  GAIT: 11/08/2022:  Ambulation into clinic c FWW.   10/11/2022 Distance walked: 20' Assistive device utilized: Environmental consultant - 2 wheeled Level of assistance: SBA Comments: Antalgic gait pattern with decreased stance duration right lower extremity, right knee flexed in  stance and minimal increase flexion for swing phase and flexed trunk.                     TODAY'S TREATMENT                                   DATE:   11/15/22  FOTO and ROM/MMT check after manipulation   Manual  Knee flexion overpressure mixed with tennis ball STM to help relax quad multiple rounds  TherEx   Knee extension stretch LE on chair x4 minutes 0# LAQs 0# x15     Education  General presentation after manipulation, edema and quad inhibition after new manipulation, POC following manipulation, lots of encouragement to continue with PT and HEP/exercise routine especially following manipulation today     11/13/2022: Therapeutic Exercise: SciFit seat 111 full revolutions  level 2 BLEs/BUEs for 4 min, BLEs only level 1 for 4 min Leg press DL 086# 5H84, Rt leg only 50# 2X10 Gastroc stretch forefoot on step heel depression 30 sec hold 3 reps Heel raises 10 reps 2 sets Tandem stance on foam with RLE in front & in back 30 sec ea without touching.  Rocker board ant/post with knee flexion / ext 10 reps with BUE light support then 10 reps with PT minA / no UE support.  Hamstring stretch RLE long sit 30 sec 2 reps Quad stretch hooklying RLE over edge with strap knee flexion 30 sec hold 2 reps Standing with right foot on edge of 18" chair holding 2 chair backs for support / balance. Knee flexion stretch with forward lean 10 sec hold 3 reps 3 sets moving stance LE 1" closer ea set for increase stretch. Bridge BLEs with PT increasing RLE knee flexion on 3rd & 5th rep - 7 reps total 2 sets  Vaso supine elevation with towel roll under heel for knee ext 34* medium compression 10 min  TREATMENT                                   DATE: 11/08/2022: Therapeutic Exercise: SciFit seat 12 full revolutions  level 2 UE/LE 8 mins Seated tailgate flexion swinging 2 mins Supine Rt AROM 5 sec hold x 5 Rt seated LAQ x 5 Leg press DL 161# x 25 , Rt leg only 50# x 20  TherActivity TUG x 1 c  FWW SPC use in clinic household distances with mild cues at times for sequencing.  Performed 30 ft. 60 ft, 60 ft c supervision.   Manual therapy Supine Rt knee flexion c mobilization c movement IR/Distraction, contract relax sequencing for flexion stretching.   Vasopneumatic Device Supine elevation with towel roll under heel for knee ext 34* medium compression 10 min  TODAY'S TREATMENT                                   DATE:11/06/2022: Therapeutic Exercise: SciFit seat 12 full revolutions  level 2 BLEs/BUEs for 4 min, BLEs only level 1 for 4 min Leg press DL 096# 0A54, Rt leg only 50# 2X10 Gastroc stretch forefoot on towel roll with wt shift on RLE 30 sec hold 3 reps Heel raises 10 reps 2 sets Tandem stance on floor with RLE in front & in back 30 sec ea without touching. Sink & chair back for safety. Hamstring stretch RLE long sit 30 sec 2 reps Quad stretch hooklying RLE over edge with strap knee flexion 30 sec hold 2 reps PT added muscle stretches, tandem stance and heel raises to HEP with HO, demo & verbal cues. Pt verbalized & return demo.   Gait Training:  Pt amb 100' x 2 with cane safely.  Manual therapy Contract-relax seated knee flexion.  PROM with gentle overpressure  Vaso supine elevation with towel roll under heel for knee ext 34* medium compression 10 min    HOME EXERCISE PROGRAM: Access Code: [email or web address] URL: [email or web address] Date: 11/06/2022 Prepared by: Vladimir Faster  Exercises - Ankle Alphabet in Elevation  - 2-4 x daily - 7 x weekly - 1 sets - 1 reps - supine quad set with towel roll under ankle  - 2-4 x daily - 7 x weekly - 2 sets - 10 reps - 5 seconds hold - Supine Heel Slide with Strap  - 2-3 x daily - 7 x weekly - 2-3 sets - 10 reps - 5 seconds hold - Seated Knee Flexion Extension AROM   - 2-4 x daily - 7 x weekly - 2-3 sets - 10 reps - 5 seconds hold - Supine Knee Extension Strengthening  - 2-4 x daily - 7 x weekly - 2-3 sets - 10 reps - 5  seconds hold - Seated Long Arc Quad  - 2-4 x daily - 7 x weekly - 2-3 sets - 10 reps - 5 seconds hold - standing calf stretch with forefoot on small step or brick  - 1 x daily - 7 x weekly - 1 sets - 3 reps - 30 seconds hold - Seated Table Hamstring Stretch  - 1 x daily - 7 x weekly - 1 sets - 3 reps - 30 seconds hold - Supine Quadriceps Stretch with Strap on Table  - 1 x daily - 7 x weekly - 1 sets - 3 reps - 30 seconds hold -  Standing Tandem Balance with Counter Support  - 1 x daily - 7 x weekly - 1 sets - 2 reps - 30 seconds hold - Heel raises near counter  - 1 x daily - 7 x weekly - 2 sets - 10 reps - 5 seconds hold  ASSESSMENT: CLINICAL IMPRESSION:  Ms. Dias arrives today doing OK, had her manipulation this morning and feeling alright afterward. Knee flexion improved after manipulation but she will still require intensive PT moving forward post-manip. Spent time working on Metallurgist for flexion ROM, also worked on improving extension ROM and on some basic exercises for edema control as well. She will return tomorrow for continuation of aggressive PT s/p manipulation.   OBJECTIVE IMPAIRMENTS: Abnormal gait, decreased activity tolerance, decreased balance, decreased endurance, decreased knowledge of condition, decreased knowledge of use of DME, decreased mobility, difficulty walking, decreased ROM, decreased strength, increased edema, postural dysfunction, obesity, and pain.   ACTIVITY LIMITATIONS: carrying, lifting, bending, sitting, standing, squatting, sleeping, stairs, transfers, bed mobility, and locomotion level  PARTICIPATION LIMITATIONS: meal prep, cleaning, driving, community activity, and church  PERSONAL FACTORS: Fitness, Past/current experiences, Time since onset of injury/illness/exacerbation, and 3+ comorbidities: see PMH  are also affecting patient's functional outcome.   REHAB POTENTIAL: Good  CLINICAL DECISION MAKING: Stable/uncomplicated  EVALUATION COMPLEXITY:  Low   GOALS: Goals reviewed with patient? Yes  SHORT TERM GOALS: (target date for Short term goals 11/10/2022)   1.  Patient will demonstrate independent use of home exercise program to maintain progress from in clinic treatments. Baseline: see objective data Goal status:  MET 10/31/2022  2. PROM right knee ext -5* and flexion 90* Baseline: see objective data Goal status: MET 10/31/2022  3. Interim FOTO >32% Baseline: see objective data  Goal status:  MET 10/31/2022  LONG TERM GOALS: (target dates for all long term goals  12/21/2022 ) (for updated cert s/p manipulation)   1. Patient will demonstrate/report pain at worst less than or equal to 2/10 to facilitate minimal limitation in daily activity secondary to pain symptoms. Baseline: see objective data Goal status: Ongoing 11/06/2022   2. Patient will demonstrate independent use of home exercise program to facilitate ability to maintain/progress functional gains from skilled physical therapy services. Baseline: see objective data Goal status: Ongoing 11/06/2022   3. Patient will demonstrate FOTO outcome > or = 47 % to indicate reduced disability due to condition. Baseline: see objective data Goal status:  MET 10/31/2022   4.  Patient will demonstrate right knee MMT 4/5 throughout to faciltiate usual transfers, stairs, squatting at [facility] for daily life.  Baseline: see objective data Goal status: Ongoing 11/06/2022   5.  Patient right knee AROM ext standing -2* and flexion seated or supine 100* Baseline: see objective data Goal status: Ongoing 11/06/2022   6.  patient amb with LRAD >500' and negotiates ramps/curbs/stairs modified independent.  Baseline: see objective data Goal status: Ongoing 11/06/2022   PLAN:  PT FREQUENCY:  5x/week for 2 weeks, then drop to 2-3x/week for an additional 3 weeks   PT DURATION: 5 weeks   PLANNED INTERVENTIONS: Therapeutic exercises, Therapeutic activity, Neuro Muscular re-education, Balance training,  Gait training, Patient/Family education, Joint mobilization, Stair training, DME instructions, Dry Needling, Electrical stimulation, Traction, Cryotherapy, vasopneumatic deviceMoist heat, Taping, Ultrasound, Ionotophoresis 4mg /ml Dexamethasone, and aquatic therapy, Manual therapy.  All included unless contraindicated  PLAN FOR NEXT SESSION:  Check ROM regularly , manual therapy and exercise for ROM post manipulation, vaso for edema.   Nedra Hai, PT,  DPT 11/16/22 2:37 PM

## 2022-11-17 ENCOUNTER — Encounter: Payer: Self-pay | Admitting: Physical Therapy

## 2022-11-17 ENCOUNTER — Ambulatory Visit (INDEPENDENT_AMBULATORY_CARE_PROVIDER_SITE_OTHER): Payer: Medicare Other | Admitting: Physical Therapy

## 2022-11-17 DIAGNOSIS — R600 Localized edema: Secondary | ICD-10-CM

## 2022-11-17 DIAGNOSIS — M25661 Stiffness of right knee, not elsewhere classified: Secondary | ICD-10-CM

## 2022-11-17 DIAGNOSIS — M6281 Muscle weakness (generalized): Secondary | ICD-10-CM | POA: Diagnosis not present

## 2022-11-17 DIAGNOSIS — R2689 Other abnormalities of gait and mobility: Secondary | ICD-10-CM

## 2022-11-17 DIAGNOSIS — M25561 Pain in right knee: Secondary | ICD-10-CM

## 2022-11-20 ENCOUNTER — Ambulatory Visit (INDEPENDENT_AMBULATORY_CARE_PROVIDER_SITE_OTHER): Payer: Medicare Other | Admitting: Physical Therapy

## 2022-11-20 ENCOUNTER — Encounter: Payer: Self-pay | Admitting: Physical Therapy

## 2022-11-20 DIAGNOSIS — R2689 Other abnormalities of gait and mobility: Secondary | ICD-10-CM

## 2022-11-20 DIAGNOSIS — M25561 Pain in right knee: Secondary | ICD-10-CM

## 2022-11-20 DIAGNOSIS — R600 Localized edema: Secondary | ICD-10-CM | POA: Diagnosis not present

## 2022-11-20 DIAGNOSIS — M6281 Muscle weakness (generalized): Secondary | ICD-10-CM | POA: Diagnosis not present

## 2022-11-20 DIAGNOSIS — M25661 Stiffness of right knee, not elsewhere classified: Secondary | ICD-10-CM | POA: Diagnosis not present

## 2022-11-20 NOTE — Therapy (Signed)
OUTPATIENT PHYSICAL THERAPY  TREATMENT   Patient Name: Jessica [name] MRN: [id number] DOB:Jul 26, 1950, 72 y.o., female Today's Date: 11/20/2022   END OF SESSION:  PT End of Session - 11/20/22 1348     Visit Number 17    Number of Visits 35    Date for PT Re-Evaluation 12/21/22    Authorization Type [employer] [facility]    Progress Note Due on Visit 20    PT Start Time 1345    PT Stop Time 1440    PT Time Calculation (min) 55 min    Activity Tolerance Patient tolerated treatment well;No increased pain    Behavior During Therapy [facility] for tasks assessed/performed                        Past Medical History:  Diagnosis Date   Anxiety    Arthritis    bilateral knees   Asthma    childhood, inhaler for wheezing PRN   Autonomic neuropathy    diabetic   Cataract    Celiac disease    Constipation    Depression    Diabetes mellitus    type II, Hemoglobin A1C 9.9 10/05/2011   Diabetic neuropathy (HCC)    Diverticulosis    Fatty liver    Gastroparesis    GERD (gastroesophageal reflux disease)    History of claustrophobia    with MRI tests   Hyperlipidemia    Hypertension    IBS (irritable bowel syndrome)    Kidney stones    Personal history of colonic polyps 03/2010   hyperplastic   PONV (postoperative nausea and vomiting)    Shingles 2021   Past Surgical History:  Procedure Laterality Date   APPENDECTOMY     CATARACT EXTRACTION Right 04/29/2018   CATARACT EXTRACTION Left 03/2018   COLONOSCOPY     DILATATION & CURRETTAGE/HYSTEROSCOPY WITH RESECTOCOPE N/A 03/24/2013   Procedure: DILATATION & CURETTAGE, HYSTEROSCOPY WITH RESECTION;  Surgeon: Levi Aland, MD;  Location: [facility];  Service: Gynecology;  Laterality: N/A;   HYSTEROSCOPY WITH D & C N/A 11/08/2015   Procedure: DILATATION AND CURETTAGE /HYSTEROSCOPY;  Surgeon: Levi Aland, MD;  Location: [facility];  Service: Gynecology;  Laterality: N/A;   left breast cyst removal  2000   LEFT HEART CATH AND  CORONARY ANGIOGRAPHY N/A 10/31/2018   Procedure: LEFT HEART CATH AND CORONARY ANGIOGRAPHY;  Surgeon: Corky Crafts, MD;  Location: [facility] INVASIVE CV LAB;  Service: Cardiovascular;  Laterality: N/A;   TOTAL KNEE ARTHROPLASTY Right 09/12/2022   Procedure: RIGHT TOTAL KNEE ARTHROPLASTY;  Surgeon: Kathryne Hitch, MD;  Location: [facility];  Service: Orthopedics;  Laterality: Right;   TUBAL LIGATION     Patient Active Problem List   Diagnosis Date Noted   Status post total right knee replacement 09/12/2022   Degenerative arthritis of left knee 08/07/2022   Chronic rhinitis 04/18/2022   Gastroesophageal reflux disease 04/18/2022   Vitamin D deficiency 04/13/2022   Morbid obesity (HCC) 04/13/2022   Peripheral sensory neuropathy due to type 2 diabetes mellitus (HCC) 04/05/2020   Bilateral lower extremity edema 01/20/2020   Family history of malignant neoplasm of endometrium 10/08/2018   Celiac disease 02/14/2018   Osteopenia 02/14/2018   Mild intermittent asthma 02/14/2018   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) 02/14/2018   Class 2 obesity due to excess calories with body mass index (BMI) of 37.0 to 37.9 in adult 02/14/2018   Fibroma of  tongue 06/06/2016   Renal calculi 01/13/2016   Degenerative cervical disc 07/27/2015   OSA (obstructive sleep apnea) 02/28/2011   Constipation, slow transit 09/23/2010   Type 2 diabetes mellitus with complication, with long-term current use of insulin (HCC) 09/23/2010   FH: colon cancer 09/23/2010   Personal history of colonic polyps 09/23/2010   Combined hyperlipidemia associated with type 2 diabetes mellitus (HCC) 01/29/2009   Diabetic gastroparesis (HCC) 02/27/2007   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 Mardelle Matte, MD   REFERRING PROVIDER: Vanita Panda. Magnus Ivan, MD  REFERRING DIAG: [phone] (ICD-10-CM) - Status post  total right knee replacement   THERAPY DIAG:  Stiffness of right knee, not elsewhere classified  Acute pain of right knee  Muscle weakness (generalized)  Localized edema  Other abnormalities of gait and mobility  Rationale for Evaluation and Treatment: Rehabilitation  ONSET DATE: 09/12/2022  SUBJECTIVE:   SUBJECTIVE STATEMENT: Her knee feels better. She can sit on toilet better.  She did exercise it all day when awake as recommended.  She was able to roll over in bed easier.   PERTINENT HISTORY: Right TKA 09/12/2022, DM2, asthma, OA, peripheral neuropathy, osteopenia, shingles  PAIN:  NPRS scale: upon arrival today  3/10 and since last PT 2/10 - 7/10 Pain location: R knee going up to hip "It feels weak"  Pain description: weak, "not a happy"  Aggravating factors: "hard to answer"  Relieving factors: Tylenol, ice  PRECAUTIONS: Fall  WEIGHT BEARING RESTRICTIONS: No  FALLS:  Has patient fallen in last 6 months? No  LIVING ENVIRONMENT: Lives with: lives with their spouse Lives in: [place] with master handicap accessible bath & bedroom ramped entrance. Does not go upstairs.  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   PATIENT SURVEYS:  10/31/2022: FOTO visit 10 52%  Eval: FOTO intake:  21%  predicted:  47%  COGNITION:10/11/2022 Overall cognitive status: WFL    SENSATION: 10/11/2022 WFL  EDEMA:  10/11/2022 Circumferential:  LLE: above knee 51.5cm,  around knee 50.2 cm, below knee 42.5 cm RLE:  above knee 55.5 cm,  around knee 53.8 cm, below knee 46.2 cm  POSTURE: 10/11/2022  rounded shoulders, forward head, flexed trunk , and weight shift left  PALPATION: 10/11/2022 Right knee tenderness along joint line & incision.   LOWER EXTREMITY ROM:   ROM Right eval Right 10/13/22 Right 10/16/22 Right 10/25/22 Right 10/31/22 Right 11/08/2022 Right 11/16/22 Right 11/17/22 PROM Right 11/20/22  Hip flexion           Hip extension           Hip abduction           Hip adduction           Hip internal rotation           Hip external rotation           Knee flexion Seated A: 64* P: 74* Active 75 AA 84* Seated P: 87* A: 77* Seated  P: 90* A:83* Supine AROM heel slide: 80    Supine and sitting 90*  Supine 87 deg  Sitting 99 deg s but not pain Seated P: 100* AA: 95*  Knee extension Seated A: LAQ -22* Seated with LE extended P: -11* Active -7 Supine A: -5*  Supine P: -1* A: quad set -3* SAQ -6* Seated AROM LAQ: -14  Supine quad set AROM -6 Seated with heel 14*, with overpressure 12*  Supine P: -1* A: quad set -4*  Ankle dorsiflexion           Ankle plantarflexion           Ankle inversion           Ankle eversion            (Blank rows = not tested)  LOWER EXTREMITY MMT:  MMT Right 10/11/2022 Right 11/08/2022 Right 11/16/22  Hip flexion     Hip extension     Hip abduction     Hip adduction     Hip internal rotation     Hip external rotation     Knee flexion 3-/5 5/5 in available range   Knee extension 3-/5 4-+/5 in available range 5 in available ROM  Ankle dorsiflexion     Ankle plantarflexion     Ankle inversion     Ankle eversion      (Blank rows = not tested)  FUNCTIONAL TESTS:  11/08/2022:  24 seconds with FWW  10/11/2022 18 inch chair transfer: requires BUE support to arise from 22" w/c to RW for stabilization TUG with RW: 41.75 sec  GAIT: 11/08/2022:  Ambulation into clinic c FWW.   10/11/2022 Distance walked: 20' Assistive device utilized: Environmental consultant -  2 wheeled Level of assistance: SBA Comments: Antalgic gait pattern with decreased stance duration right lower extremity, right knee flexed in stance and minimal increase flexion for swing phase and flexed trunk.                     TODAY'S TREATMENT                                                DATE: 11/20/2022 Therapeutic Exercise: Sci[employer] bike with BLEs & BUEs level 1  seat 9 for 4 min, then seat 8 for 4 min. Seated heel slide with Rt foot on pillow case to reduce floor resistance and sheet tied around ankle for BUEs to assist & LLE assisted knee flexion 10 reps  LAQ assisted w/ strap assisted end range 2x10 cues for setup Supine heel slide with lower leg on 18 " ball with strap assist 10 reps Knee flexion end range manual assist 10 sec hold Supine RLE active flexion with femur held vertical with towel - 10 reps  Manual Therapy: Seated; passive physiological movement knee flex/ext RLE to pt tolerance, oscillations to reduce muscle guarding, gentle distraction to improve flexion with ROM Supine; passive physiological movement knee flex/ext RLE to pt tolerance  Vaso right knee medium compression 34* with elevation 10 minutes.    TREATMENT   [facility] Adult PT Treatment:  DATE: 11/17/22 Therapeutic Exercise: LAQ assisted w/ contralateral limb 2x10 cues for setup Knee flexion AAROM (contralateral limb assist) 2x10 Standing mini lunge on 2inch step in // bars for flexion ROM 2x8  Verbal HEP review  Manual Therapy: Seated; passive physiological movement knee flex/ext RLE to pt tolerance, oscillations to reduce muscle guarding, gentle distraction to improve flexion with ROM Supine; passive physiological movement knee flex/ext RLE to pt tolerance   11/15/22  FOTO and ROM/MMT check after manipulation   Manual  Knee flexion overpressure mixed with tennis ball STM to help relax quad multiple rounds  TherEx   Knee extension stretch LE on chair  x4 minutes 0# LAQs 0# x15     Education  General presentation after manipulation, edema and quad inhibition after new manipulation, POC following manipulation, lots of encouragement to continue with PT and HEP/exercise routine especially following manipulation today      HOME EXERCISE PROGRAM: Access Code: [email or web address] URL: [email or web address] Date: 11/06/2022 Prepared by: Vladimir Faster  Exercises - Ankle Alphabet in Elevation  - 2-4 x daily - 7 x weekly - 1 sets - 1 reps - supine quad set with towel roll under ankle  - 2-4 x daily - 7 x weekly - 2 sets - 10 reps - 5 seconds hold - Supine Heel Slide with Strap  - 2-3 x daily - 7 x weekly - 2-3 sets - 10 reps - 5 seconds hold - Seated Knee Flexion Extension AROM   - 2-4 x daily - 7 x weekly - 2-3 sets - 10 reps - 5 seconds hold - Supine Knee Extension Strengthening  - 2-4 x daily - 7 x weekly - 2-3 sets - 10 reps - 5 seconds hold - Seated Long Arc Quad  - 2-4 x daily - 7 x weekly - 2-3 sets - 10 reps - 5 seconds hold - standing calf stretch with forefoot on small step or brick  - 1 x daily - 7 x weekly - 1 sets - 3 reps - 30 seconds hold - Seated Table Hamstring Stretch  - 1 x daily - 7 x weekly - 1 sets - 3 reps - 30 seconds hold - Supine Quadriceps Stretch with Strap on Table  - 1 x daily - 7 x weekly - 1 sets - 3 reps - 30 seconds hold - Standing Tandem Balance with Counter Support  - 1 x daily - 7 x weekly - 1 sets - 2 reps - 30 seconds hold - Heel raises near counter  - 1 x daily - 7 x weekly - 2 sets - 10 reps - 5 seconds hold  ASSESSMENT: CLINICAL IMPRESSION: Patient has better range following manipulation with PT manual therapy & exercises.  Patient also is reporting less pain.  Pt continues to benefit from skilled PT daily for first 2 weeks per protocol.   OBJECTIVE IMPAIRMENTS: Abnormal gait, decreased activity tolerance, decreased balance, decreased endurance, decreased knowledge of condition, decreased  knowledge of use of DME, decreased mobility, difficulty walking, decreased ROM, decreased strength, increased edema, postural dysfunction, obesity, and pain.   ACTIVITY LIMITATIONS: carrying, lifting, bending, sitting, standing, squatting, sleeping, stairs, transfers, bed mobility, and locomotion level  PARTICIPATION LIMITATIONS: meal prep, cleaning, driving, community activity, and church  PERSONAL FACTORS: Fitness, Past/current experiences, Time since onset of injury/illness/exacerbation, and 3+ comorbidities: see PMH  are also affecting patient's functional outcome.   REHAB POTENTIAL: Good  CLINICAL DECISION MAKING: Stable/uncomplicated  EVALUATION COMPLEXITY: Low   GOALS: Goals reviewed with patient?  Yes  SHORT TERM GOALS: (target date for Short term goals 11/10/2022)   1.  Patient will demonstrate independent use of home exercise program to maintain progress from in clinic treatments. Baseline: see objective data Goal status:  MET 10/31/2022  2. PROM right knee ext -5* and flexion 90* Baseline: see objective data Goal status: MET 10/31/2022  3. Interim FOTO >32% Baseline: see objective data  Goal status:  MET 10/31/2022  LONG TERM GOALS: (target dates for all long term goals  12/21/2022 ) (for updated cert s/p manipulation)   1. Patient will demonstrate/report pain at worst less than or equal to 2/10 to facilitate minimal limitation in daily activity secondary to pain symptoms. Baseline: see objective data Goal status: Ongoing 11/06/2022   2. Patient will demonstrate independent use of home exercise program to facilitate ability to maintain/progress functional gains from skilled physical therapy services. Baseline: see objective data Goal status: Ongoing 11/06/2022   3. Patient will demonstrate FOTO outcome > or = 47 % to indicate reduced disability due to condition. Baseline: see objective data Goal status:  MET 10/31/2022   4.  Patient will demonstrate right knee MMT 4/5  throughout to faciltiate usual transfers, stairs, squatting at [facility] for daily life.  Baseline: see objective data Goal status: Ongoing 11/06/2022   5.  Patient right knee AROM ext standing -2* and flexion seated or supine 100* Baseline: see objective data Goal status: Ongoing 11/06/2022   6.  patient amb with LRAD >500' and negotiates ramps/curbs/stairs modified independent.  Baseline: see objective data Goal status: Ongoing 11/06/2022   PLAN:  PT FREQUENCY:  5x/week for 2 weeks, then drop to 2-3x/week for an additional 3 weeks   PT DURATION: 5 weeks   PLANNED INTERVENTIONS: Therapeutic exercises, Therapeutic activity, Neuro Muscular re-education, Balance training, Gait training, Patient/Family education, Joint mobilization, Stair training, DME instructions, Dry Needling, Electrical stimulation, Traction, Cryotherapy, vasopneumatic deviceMoist heat, Taping, Ultrasound, Ionotophoresis 4mg /ml Dexamethasone, and aquatic therapy, Manual therapy.  All included unless contraindicated  PLAN FOR NEXT SESSION:   Check ROM regularly , continue manual therapy and exercise for ROM post manipulation, vaso for edema.      Vladimir Faster, PT, DPT 11/20/2022, 3:20 PM

## 2022-11-21 ENCOUNTER — Other Ambulatory Visit: Payer: Self-pay | Admitting: Podiatry

## 2022-11-21 ENCOUNTER — Encounter: Payer: Self-pay | Admitting: Rehabilitative and Restorative Service Providers"

## 2022-11-21 ENCOUNTER — Ambulatory Visit (INDEPENDENT_AMBULATORY_CARE_PROVIDER_SITE_OTHER): Payer: Medicare Other | Admitting: Rehabilitative and Restorative Service Providers"

## 2022-11-21 DIAGNOSIS — M25561 Pain in right knee: Secondary | ICD-10-CM | POA: Diagnosis not present

## 2022-11-21 DIAGNOSIS — M25661 Stiffness of right knee, not elsewhere classified: Secondary | ICD-10-CM

## 2022-11-21 DIAGNOSIS — M6281 Muscle weakness (generalized): Secondary | ICD-10-CM

## 2022-11-21 DIAGNOSIS — R2689 Other abnormalities of gait and mobility: Secondary | ICD-10-CM

## 2022-11-21 DIAGNOSIS — R2681 Unsteadiness on feet: Secondary | ICD-10-CM

## 2022-11-21 DIAGNOSIS — R600 Localized edema: Secondary | ICD-10-CM

## 2022-11-21 NOTE — Therapy (Signed)
OUTPATIENT PHYSICAL THERAPY  TREATMENT   Patient Name: Jessica [name] MRN: [id number] DOB:Feb 06, 1951, 72 y.o., female Today's Date: 11/21/2022   END OF SESSION:  PT End of Session - 11/21/22 1419     Visit Number 18    Number of Visits 35    Date for PT Re-Evaluation 12/21/22    Authorization Type [employer] [facility]    Progress Note Due on Visit 20    PT Start Time 1421    PT Stop Time 1510    PT Time Calculation (min) 49 min    Activity Tolerance Patient tolerated treatment well    Behavior During Therapy WFL for tasks assessed/performed                         Past Medical History:  Diagnosis Date   Anxiety    Arthritis    bilateral knees   Asthma    childhood, inhaler for wheezing PRN   Autonomic neuropathy    diabetic   Cataract    Celiac disease    Constipation    Depression    Diabetes mellitus    type II, Hemoglobin A1C 9.9 10/05/2011   Diabetic neuropathy (HCC)    Diverticulosis    Fatty liver    Gastroparesis    GERD (gastroesophageal reflux disease)    History of claustrophobia    with MRI tests   Hyperlipidemia    Hypertension    IBS (irritable bowel syndrome)    Kidney stones    Personal history of colonic polyps 03/2010   hyperplastic   PONV (postoperative nausea and vomiting)    Shingles 2021   Past Surgical History:  Procedure Laterality Date   APPENDECTOMY     CATARACT EXTRACTION Right 04/29/2018   CATARACT EXTRACTION Left 03/2018   COLONOSCOPY     DILATATION & CURRETTAGE/HYSTEROSCOPY WITH RESECTOCOPE N/A 03/24/2013   Procedure: DILATATION & CURETTAGE, HYSTEROSCOPY WITH RESECTION;  Surgeon: Levi Aland, MD;  Location: [facility];  Service: Gynecology;  Laterality: N/A;   HYSTEROSCOPY WITH D & C N/A 11/08/2015   Procedure: DILATATION AND CURETTAGE /HYSTEROSCOPY;  Surgeon: Levi Aland, MD;  Location: [facility];  Service: Gynecology;  Laterality: N/A;   left breast cyst removal  2000   LEFT HEART CATH AND CORONARY ANGIOGRAPHY  N/A 10/31/2018   Procedure: LEFT HEART CATH AND CORONARY ANGIOGRAPHY;  Surgeon: Corky Crafts, MD;  Location: [facility];  Service: Cardiovascular;  Laterality: N/A;   TOTAL KNEE ARTHROPLASTY Right 09/12/2022   Procedure: RIGHT TOTAL KNEE ARTHROPLASTY;  Surgeon: Kathryne Hitch, MD;  Location: [facility];  Service: Orthopedics;  Laterality: Right;   TUBAL LIGATION     Patient Active Problem List   Diagnosis Date Noted   Status post total right knee replacement 09/12/2022   Degenerative arthritis of left knee 08/07/2022   Chronic rhinitis 04/18/2022   Gastroesophageal reflux disease 04/18/2022   Vitamin D deficiency 04/13/2022   Morbid obesity (HCC) 04/13/2022   Peripheral sensory neuropathy due to type 2 diabetes mellitus (HCC) 04/05/2020   Bilateral lower extremity edema 01/20/2020   Family history of malignant neoplasm of endometrium 10/08/2018   Celiac disease 02/14/2018   Osteopenia 02/14/2018   Mild intermittent asthma 02/14/2018   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) 02/14/2018   Class 2 obesity due to excess calories with body mass index (BMI) of 37.0 to 37.9 in adult 02/14/2018   Fibroma of tongue  06/06/2016   Renal calculi 01/13/2016   Degenerative cervical disc 07/27/2015   OSA (obstructive sleep apnea) 02/28/2011   Constipation, slow transit 09/23/2010   Type 2 diabetes mellitus with complication, with long-term current use of insulin (HCC) 09/23/2010   FH: colon cancer 09/23/2010   Personal history of colonic polyps 09/23/2010   Combined hyperlipidemia associated with type 2 diabetes mellitus (HCC) 01/29/2009   Diabetic gastroparesis (HCC) 02/27/2007   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 Mardelle Matte, MD   REFERRING PROVIDER: Vanita Panda. Magnus Ivan, MD  REFERRING DIAG: [id number] (ICD-10-CM) - Status post total right knee  replacement   THERAPY DIAG:  Stiffness of right knee, not elsewhere classified  Acute pain of right knee  Muscle weakness (generalized)  Localized edema  Other abnormalities of gait and mobility  Unsteadiness on feet  Rationale for Evaluation and Treatment: Rehabilitation  ONSET DATE: 09/12/2022  SUBJECTIVE:   SUBJECTIVE STATEMENT: Reported no pain just walking out.  Reported feeling like some improvement was there since manipulation .   PERTINENT HISTORY: Right TKA 09/12/2022, DM2, asthma, OA, peripheral neuropathy, osteopenia, shingles  PAIN:  NPRS scale: at worst in last 24 hours 6/10 Pain location: Rt  Pain description: throbbing Aggravating factors: nighttime Relieving factors: Tylenol, ice  PRECAUTIONS: Fall  WEIGHT BEARING RESTRICTIONS: No  FALLS:  Has patient fallen in last 6 months? No  LIVING ENVIRONMENT: Lives with: lives with their spouse Lives in: [place] with master handicap accessible bath & bedroom ramped entrance. Does not go upstairs.  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   PATIENT SURVEYS:  10/31/2022: FOTO visit 10 52%  Eval: FOTO intake:  21%  predicted:  47%  COGNITION:10/11/2022 Overall cognitive status: WFL    SENSATION: 10/11/2022 WFL  EDEMA:  10/11/2022 Circumferential:  LLE: above knee 51.5cm,  around knee 50.2 cm, below knee 42.5 cm RLE: above knee 55.5 cm,  around knee 53.8 cm, below knee 46.2 cm  POSTURE: 10/11/2022  rounded shoulders, forward head,  flexed trunk , and weight shift left  PALPATION: 10/11/2022 Right knee tenderness along joint line & incision.   LOWER EXTREMITY ROM:   ROM Right eval Right 10/13/22 Right 10/16/22 Right 10/25/22 Right 10/31/22 Right 11/08/2022 Right 11/16/22 Right 11/17/22 PROM Right 11/20/22  Hip flexion           Hip extension           Hip abduction           Hip adduction           Hip internal rotation           Hip external rotation           Knee flexion Seated A: 64* P: 74* Active 75 AA 84* Seated P: 87* A: 77* Seated P: 90* A:83* Supine AROM heel slide: 80    Supine and sitting 90*  Supine 87 deg  Sitting 99 deg s but not pain Seated P: 100* AA:  95*  Knee extension Seated A: LAQ -22* Seated with LE extended P: -11* Active -7 Supine A: -5*  Supine P: -1* A: quad set -3* SAQ -6* Seated AROM LAQ: -14  Supine quad set AROM -6 Seated with heel 14*, with overpressure 12*  Supine P: -1* A: quad set -4*  Ankle dorsiflexion           Ankle plantarflexion           Ankle inversion           Ankle eversion            (Blank rows = not tested)  LOWER EXTREMITY MMT:  MMT Right 10/11/2022 Right 11/08/2022 Right 11/16/22  Hip flexion     Hip extension     Hip abduction     Hip adduction     Hip internal rotation     Hip external rotation     Knee flexion 3-/5 5/5 in available range   Knee extension 3-/5 4-+/5 in available range 5 in available ROM  Ankle dorsiflexion     Ankle plantarflexion     Ankle inversion     Ankle eversion      (Blank rows = not tested)  FUNCTIONAL TESTS:  11/08/2022:  24 seconds with FWW  10/11/2022 18 inch chair transfer: requires BUE support to arise from 22" w/c to RW for stabilization TUG with RW: 41.75 sec  GAIT: 11/08/2022:  Ambulation into clinic c FWW.   10/11/2022 Distance walked: 20' Assistive device utilized: Environmental consultant - 2 wheeled Level of assistance: SBA Comments: Antalgic gait pattern with decreased stance duration right lower  extremity, right knee flexed in stance and minimal increase flexion for swing phase and flexed trunk.                     TODAY'S TREATMENT                                                DATE: 11/21/2022 Therapeutic Exercise: SciFit bike with BLEs & BUEs level 2 seat 9 for 8 mins  Gastroc incline stretch bilateral 30 sec x 3  Leg press in max bending tolerated - double leg 75 lbs x 15 , single leg Rt   43 lbs 2 x 15.  Rest in bending for stretch Seated Rt knee flexion c Lt leg overpressure 15 second hold x 5 Supine Rt knee heel slide with strap assist at top 5 sec hold x 10  Supine Rt leg quad set 5 sec hold x 10   Manual Therapy: Seated Rt knee flexion c mobilization c movement IR/distraction.  Contract relax techniques into flexion Rt knee.   Vaso  right knee medium compression 34* with elevation 10 minutes.   TODAY'S TREATMENT                                                DATE: 11/20/2022 Therapeutic Exercise: SciFit bike with BLEs & BUEs level 1  seat 9 for 4 min, then seat 8 for 4 min. Seated heel slide with Rt foot on pillow case to reduce floor resistance and sheet tied around ankle for BUEs to assist & LLE assisted knee flexion 10 reps  LAQ assisted w/ strap assisted end range 2x10 cues for setup Supine heel slide with lower leg on 18 " ball with strap assist 10 reps Knee flexion end range manual assist 10 sec hold Supine RLE active flexion with femur held vertical with towel - 10 reps  Manual Therapy: Seated; passive physiological movement knee flex/ext RLE to pt tolerance, oscillations to reduce muscle guarding, gentle distraction to improve flexion with ROM Supine; passive physiological movement knee flex/ext RLE to pt tolerance  Vaso right knee medium compression 34* with elevation 10 minutes.    TREATMENT                                                                  DATE: 11/17/22 Therapeutic Exercise: LAQ assisted w/ contralateral limb 2x10 cues for setup Knee  flexion AAROM (contralateral limb assist) 2x10 Standing mini lunge on 2inch step in // bars for flexion ROM 2x8  Verbal HEP review  Manual Therapy: Seated; passive physiological movement knee flex/ext RLE to pt tolerance, oscillations to reduce muscle guarding, gentle distraction to improve flexion with ROM Supine; passive physiological movement knee flex/ext RLE to pt tolerance     HOME EXERCISE PROGRAM: Access Code: [email or web address] URL: [email or web address] Date: 11/06/2022 Prepared by: Vladimir Faster  Exercises - Ankle Alphabet in Elevation  - 2-4 x daily - 7 x weekly - 1 sets - 1 reps - supine quad set with towel roll under ankle  - 2-4 x daily - 7 x weekly - 2 sets - 10 reps - 5 seconds hold - Supine Heel Slide with Strap  - 2-3 x daily - 7 x weekly - 2-3 sets - 10 reps - 5 seconds hold - Seated Knee Flexion Extension AROM   - 2-4 x daily - 7 x weekly - 2-3 sets - 10 reps - 5 seconds hold - Supine Knee Extension Strengthening  - 2-4 x daily - 7 x weekly - 2-3 sets - 10 reps - 5 seconds hold - Seated Long Arc Quad  - 2-4 x daily - 7 x weekly - 2-3 sets - 10 reps - 5 seconds hold - standing calf stretch with forefoot on small step or brick  - 1 x daily - 7 x weekly - 1 sets - 3 reps - 30 seconds hold - Seated Table Hamstring Stretch  - 1 x daily - 7 x weekly - 1 sets - 3 reps - 30 seconds hold - Supine Quadriceps Stretch with Strap on Table  - 1 x daily - 7 x weekly - 1 sets - 3 reps - 30 seconds hold - Standing Tandem Balance with Counter Support  - 1 x daily - 7 x weekly - 1 sets - 2 reps - 30 seconds hold - Heel raises near counter  - 1 x daily - 7 x weekly - 2 sets - 10 reps - 5 seconds hold  ASSESSMENT: CLINICAL IMPRESSION: Improvement in quality and tolerance for flexion continued overall.  Pt to continue to benefit from progressive therex and manual intervention to promote improvement in mobility for functional tasks(squats, walking, transfers, stair navigation).     OBJECTIVE IMPAIRMENTS: Abnormal gait, decreased activity tolerance, decreased balance, decreased endurance, decreased knowledge of condition, decreased knowledge of use of DME, decreased mobility, difficulty walking,  decreased ROM, decreased strength, increased edema, postural dysfunction, obesity, and pain.   ACTIVITY LIMITATIONS: carrying, lifting, bending, sitting, standing, squatting, sleeping, stairs, transfers, bed mobility, and locomotion level  PARTICIPATION LIMITATIONS: meal prep, cleaning, driving, community activity, and church  PERSONAL FACTORS: Fitness, Past/current experiences, Time since onset of injury/illness/exacerbation, and 3+ comorbidities: see PMH  are also affecting patient's functional outcome.   REHAB POTENTIAL: Good  CLINICAL DECISION MAKING: Stable/uncomplicated  EVALUATION COMPLEXITY: Low   GOALS: Goals reviewed with patient? Yes  SHORT TERM GOALS: (target date for Short term goals 11/10/2022)   1.  Patient will demonstrate independent use of home exercise program to maintain progress from in clinic treatments. Baseline: see objective data Goal status:  MET 10/31/2022  2. PROM right knee ext -5* and flexion 90* Baseline: see objective data Goal status: MET 10/31/2022  3. Interim FOTO >32% Baseline: see objective data  Goal status:  MET 10/31/2022  LONG TERM GOALS: (target dates for all long term goals  12/21/2022 ) (for updated cert s/p manipulation)   1. Patient will demonstrate/report pain at worst less than or equal to 2/10 to facilitate minimal limitation in daily activity secondary to pain symptoms. Baseline: see objective data Goal status: Ongoing 11/06/2022   2. Patient will demonstrate independent use of home exercise program to facilitate ability to maintain/progress functional gains from skilled physical therapy services. Baseline: see objective data Goal status: Ongoing 11/06/2022   3. Patient will demonstrate FOTO outcome > or = 47 % to  indicate reduced disability due to condition. Baseline: see objective data Goal status:  MET 10/31/2022   4.  Patient will demonstrate right knee MMT 4/5 throughout to faciltiate usual transfers, stairs, squatting at [facility] for daily life.  Baseline: see objective data Goal status: Ongoing 11/06/2022   5.  Patient right knee AROM ext standing -2* and flexion seated or supine 100* Baseline: see objective data Goal status: Ongoing 11/06/2022   6.  patient amb with LRAD >500' and negotiates ramps/curbs/stairs modified independent.  Baseline: see objective data Goal status: Ongoing 11/06/2022   PLAN:  PT FREQUENCY:  5x/week for 2 weeks, then drop to 2-3x/week for an additional 3 weeks   PT DURATION: 5 weeks   PLANNED INTERVENTIONS: Therapeutic exercises, Therapeutic activity, Neuro Muscular re-education, Balance training, Gait training, Patient/Family education, Joint mobilization, Stair training, DME instructions, Dry Needling, Electrical stimulation, Traction, Cryotherapy, vasopneumatic deviceMoist heat, Taping, Ultrasound, Ionotophoresis 4mg /ml Dexamethasone, and aquatic therapy, Manual therapy.  All included unless contraindicated  PLAN FOR NEXT SESSION:  Manual for mobiltiy gains.  Balance and strength improvements, SPC use in clinic trial?     Chyrel Masson, PT, DPT, OCS, ATC 11/21/22  2:58 PM

## 2022-11-22 ENCOUNTER — Ambulatory Visit (INDEPENDENT_AMBULATORY_CARE_PROVIDER_SITE_OTHER): Payer: Medicare Other | Admitting: Physical Therapy

## 2022-11-22 ENCOUNTER — Telehealth: Payer: Self-pay | Admitting: Podiatry

## 2022-11-22 ENCOUNTER — Encounter: Payer: Self-pay | Admitting: Physical Therapy

## 2022-11-22 DIAGNOSIS — R600 Localized edema: Secondary | ICD-10-CM | POA: Diagnosis not present

## 2022-11-22 DIAGNOSIS — M25561 Pain in right knee: Secondary | ICD-10-CM

## 2022-11-22 DIAGNOSIS — M6281 Muscle weakness (generalized): Secondary | ICD-10-CM

## 2022-11-22 DIAGNOSIS — M25661 Stiffness of right knee, not elsewhere classified: Secondary | ICD-10-CM | POA: Diagnosis not present

## 2022-11-22 DIAGNOSIS — R2681 Unsteadiness on feet: Secondary | ICD-10-CM

## 2022-11-22 DIAGNOSIS — R2689 Other abnormalities of gait and mobility: Secondary | ICD-10-CM

## 2022-11-22 NOTE — Therapy (Signed)
OUTPATIENT PHYSICAL THERAPY  TREATMENT   Patient Name: Jessica [name] MRN: [id number] DOB:1950-07-10, 72 y.o., female Today's Date: 11/22/2022   END OF SESSION:  PT End of Session - 11/22/22 1348     Visit Number 19    Number of Visits 35    Date for PT Re-Evaluation 12/21/22    Authorization Type [employer] [facility]    Progress Note Due on Visit 25    PT Start Time 1345    PT Stop Time 1440    PT Time Calculation (min) 55 min    Activity Tolerance Patient tolerated treatment well    Behavior During Therapy WFL for tasks assessed/performed                          Past Medical History:  Diagnosis Date   Anxiety    Arthritis    bilateral knees   Asthma    childhood, inhaler for wheezing PRN   Autonomic neuropathy    diabetic   Cataract    Celiac disease    Constipation    Depression    Diabetes mellitus    type II, Hemoglobin A1C 9.9 10/05/2011   Diabetic neuropathy (HCC)    Diverticulosis    Fatty liver    Gastroparesis    GERD (gastroesophageal reflux disease)    History of claustrophobia    with MRI tests   Hyperlipidemia    Hypertension    IBS (irritable bowel syndrome)    Kidney stones    Personal history of colonic polyps 03/2010   hyperplastic   PONV (postoperative nausea and vomiting)    Shingles 2021   Past Surgical History:  Procedure Laterality Date   APPENDECTOMY     CATARACT EXTRACTION Right 04/29/2018   CATARACT EXTRACTION Left 03/2018   COLONOSCOPY     DILATATION & CURRETTAGE/HYSTEROSCOPY WITH RESECTOCOPE N/A 03/24/2013   Procedure: DILATATION & CURETTAGE, HYSTEROSCOPY WITH RESECTION;  Surgeon: Levi Aland, MD;  Location: [facility];  Service: Gynecology;  Laterality: N/A;   HYSTEROSCOPY WITH D & C N/A 11/08/2015   Procedure: DILATATION AND CURETTAGE /HYSTEROSCOPY;  Surgeon: Levi Aland, MD;  Location: [facility];  Service: Gynecology;  Laterality: N/A;   left breast cyst removal  2000   LEFT HEART CATH AND CORONARY ANGIOGRAPHY  N/A 10/31/2018   Procedure: LEFT HEART CATH AND CORONARY ANGIOGRAPHY;  Surgeon: Corky Crafts, MD;  Location: [facility];  Service: Cardiovascular;  Laterality: N/A;   TOTAL KNEE ARTHROPLASTY Right 09/12/2022   Procedure: RIGHT TOTAL KNEE ARTHROPLASTY;  Surgeon: Kathryne Hitch, MD;  Location: [facility];  Service: Orthopedics;  Laterality: Right;   TUBAL LIGATION     Patient Active Problem List   Diagnosis Date Noted   Status post total right knee replacement 09/12/2022   Degenerative arthritis of left knee 08/07/2022   Chronic rhinitis 04/18/2022   Gastroesophageal reflux disease 04/18/2022   Vitamin D deficiency 04/13/2022   Morbid obesity (HCC) 04/13/2022   Peripheral sensory neuropathy due to type 2 diabetes mellitus (HCC) 04/05/2020   Bilateral lower extremity edema 01/20/2020   Family history of malignant neoplasm of endometrium 10/08/2018   Celiac disease 02/14/2018   Osteopenia 02/14/2018   Mild intermittent asthma 02/14/2018   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) 02/14/2018   Class 2 obesity due to excess calories with body mass index (BMI) of 37.0 to 37.9 in adult 02/14/2018   Fibroma of  tongue 06/06/2016   Renal calculi 01/13/2016   Degenerative cervical disc 07/27/2015   OSA (obstructive sleep apnea) 02/28/2011   Constipation, slow transit 09/23/2010   Type 2 diabetes mellitus with complication, with long-term current use of insulin (HCC) 09/23/2010   FH: colon cancer 09/23/2010   Personal history of colonic polyps 09/23/2010   Combined hyperlipidemia associated with type 2 diabetes mellitus (HCC) 01/29/2009   Diabetic gastroparesis (HCC) 02/27/2007   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 Mardelle Matte, MD   REFERRING PROVIDER: Vanita Panda. Magnus Ivan, MD  REFERRING DIAG: [phone] (ICD-10-CM) - Status post total right knee  replacement   THERAPY DIAG:  Stiffness of right knee, not elsewhere classified  Acute pain of right knee  Muscle weakness (generalized)  Localized edema  Other abnormalities of gait and mobility  Unsteadiness on feet  Rationale for Evaluation and Treatment: Rehabilitation  ONSET DATE: 09/12/2022  SUBJECTIVE:   SUBJECTIVE STATEMENT: Her knee buckled when she was walking with RW but was able to catch herself. She is not sure she would have caught herself on cane.   PERTINENT HISTORY: Right TKA 09/12/2022, DM2, asthma, OA, peripheral neuropathy, osteopenia, shingles  PAIN:  NPRS scale:today 7/10 Pain location: Rt  Pain description: throbbing Aggravating factors: nighttime Relieving factors: Tylenol, ice  PRECAUTIONS: Fall  WEIGHT BEARING RESTRICTIONS: No  FALLS:  Has patient fallen in last 6 months? No  LIVING ENVIRONMENT: Lives with: lives with their spouse Lives in: [place] with master handicap accessible bath & bedroom ramped entrance. Does not go upstairs.  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   PATIENT SURVEYS:  10/31/2022: FOTO visit 10 52%  Eval: FOTO intake:  21%  predicted:  47%  COGNITION:10/11/2022 Overall cognitive status: WFL    SENSATION: 10/11/2022 WFL  EDEMA:  10/11/2022 Circumferential:  LLE: above knee 51.5cm,  around knee 50.2 cm, below knee 42.5 cm RLE: above knee 55.5 cm,  around knee 53.8 cm, below knee 46.2 cm  POSTURE: 10/11/2022  rounded shoulders, forward  head, flexed trunk , and weight shift left  PALPATION: 10/11/2022 Right knee tenderness along joint line & incision.   LOWER EXTREMITY ROM:   ROM Right eval Right 10/13/22 Right 10/16/22 Right 10/25/22 Right 10/31/22 Right 11/08/2022 Right 11/16/22 Right 11/17/22 PROM Right 11/20/22  Hip flexion           Hip extension           Hip abduction           Hip adduction           Hip internal rotation           Hip external rotation           Knee flexion Seated A: 64* P: 74* Active 75 AA 84* Seated P: 87* A: 77* Seated P: 90* A:83* Supine AROM heel slide: 80    Supine and sitting 90*  Supine 87 deg  Sitting 99 deg s but not pain  Seated P: 100* AA: 95*  Knee extension Seated A: LAQ -22* Seated with LE extended P: -11* Active -7 Supine A: -5*  Supine P: -1* A: quad set -3* SAQ -6* Seated AROM LAQ: -14  Supine quad set AROM -6 Seated with heel 14*, with overpressure 12*  Supine P: -1* A: quad set -4*  Ankle dorsiflexion           Ankle plantarflexion           Ankle inversion           Ankle eversion            (Blank rows = not tested)  LOWER EXTREMITY MMT:  MMT Right 10/11/2022 Right 11/08/2022 Right 11/16/22  Hip flexion     Hip extension     Hip abduction     Hip adduction     Hip internal rotation     Hip external rotation     Knee flexion 3-/5 5/5 in available range   Knee extension 3-/5 4-+/5 in available range 5 in available ROM  Ankle dorsiflexion     Ankle plantarflexion     Ankle inversion     Ankle eversion      (Blank rows = not tested)  FUNCTIONAL TESTS:  11/08/2022:  24 seconds with FWW  10/11/2022 18 inch chair transfer: requires BUE support to arise from 22" w/c to RW for stabilization TUG with RW: 41.75 sec  GAIT: 11/08/2022:  Ambulation into clinic c FWW.   10/11/2022 Distance walked: 20' Assistive device utilized: Environmental consultant - 2 wheeled Level of assistance: SBA Comments: Antalgic gait pattern with decreased stance duration right lower  extremity, right knee flexed in stance and minimal increase flexion for swing phase and flexed trunk.                     TODAY'S TREATMENT                                                DATE: 11/22/2022 Therapeutic Exercise: SciFit bike with BLEs & BUEs level 2 seat 9 for 8 mins  Seated on raised mat table with Rt foot on rolling stool: active knee flexion rolling stool 20 reps 5 sec hold; then 10 reps with PT passively increasing knee flexion with pt isometric hold 3 sec. Standing RLE TKE green Theraband 10 reps 1st set only TKE, 2nd set SLS with TKE tapping LLE on cone with cane LUE & light touch on raised mat table RUE. Gastroc incline stretch bilateral 30 sec x 3  Leg press in max bending tolerated - double leg 81 lbs x 15 , single leg Rt 43 lbs 2 x 15.  Rest in bending for stretch   Gait Training: Pt amb 60' X 2 with cane stand alone tip including scanning with supervision.   Manual Therapy: Seated Rt knee flexion c mobilization c movement IR/distraction.  Contract relax techniques into flexion Rt knee.   Vaso  right knee medium compression 34* with elevation 10 minutes.   TREATMENT                                                DATE: 11/21/2022 Therapeutic Exercise: SciFit bike  with BLEs & BUEs level 2 seat 9 for 8 mins  Gastroc incline stretch bilateral 30 sec x 3  Leg press in max bending tolerated - double leg 75 lbs x 15 , single leg Rt   43 lbs 2 x 15.  Rest in bending for stretch Seated Rt knee flexion c Lt leg overpressure 15 second hold x 5 Supine Rt knee heel slide with strap assist at top 5 sec hold x 10  Supine Rt leg quad set 5 sec hold x 10   Manual Therapy: Seated Rt knee flexion c mobilization c movement IR/distraction.  Contract relax techniques into flexion Rt knee.   Vaso  right knee medium compression 34* with elevation 10 minutes.   TREATMENT                                                DATE: 11/20/2022 Therapeutic Exercise: SciFit bike with BLEs &  BUEs level 1  seat 9 for 4 min, then seat 8 for 4 min. Seated heel slide with Rt foot on pillow case to reduce floor resistance and sheet tied around ankle for BUEs to assist & LLE assisted knee flexion 10 reps  LAQ assisted w/ strap assisted end range 2x10 cues for setup Supine heel slide with lower leg on 18 " ball with strap assist 10 reps Knee flexion end range manual assist 10 sec hold Supine RLE active flexion with femur held vertical with towel - 10 reps  Manual Therapy: Seated; passive physiological movement knee flex/ext RLE to pt tolerance, oscillations to reduce muscle guarding, gentle distraction to improve flexion with ROM Supine; passive physiological movement knee flex/ext RLE to pt tolerance  Vaso right knee medium compression 34* with elevation 10 minutes.      HOME EXERCISE PROGRAM: Access Code: [email or web address] URL: [email or web address] Date: 11/06/2022 Prepared by: Vladimir Faster  Exercises - Ankle Alphabet in Elevation  - 2-4 x daily - 7 x weekly - 1 sets - 1 reps - supine quad set with towel roll under ankle  - 2-4 x daily - 7 x weekly - 2 sets - 10 reps - 5 seconds hold - Supine Heel Slide with Strap  - 2-3 x daily - 7 x weekly - 2-3 sets - 10 reps - 5 seconds hold - Seated Knee Flexion Extension AROM   - 2-4 x daily - 7 x weekly - 2-3 sets - 10 reps - 5 seconds hold - Supine Knee Extension Strengthening  - 2-4 x daily - 7 x weekly - 2-3 sets - 10 reps - 5 seconds hold - Seated Long Arc Quad  - 2-4 x daily - 7 x weekly - 2-3 sets - 10 reps - 5 seconds hold - standing calf stretch with forefoot on small step or brick  - 1 x daily - 7 x weekly - 1 sets - 3 reps - 30 seconds hold - Seated Table Hamstring Stretch  - 1 x daily - 7 x weekly - 1 sets - 3 reps - 30 seconds hold - Supine Quadriceps Stretch with Strap on Table  - 1 x daily - 7 x weekly - 1 sets - 3 reps - 30 seconds hold - Standing Tandem Balance with Counter Support  - 1 x daily - 7 x weekly - 1  sets - 2 reps - 30 seconds hold -  Heel raises near counter  - 1 x daily - 7 x weekly - 2 sets - 10 reps - 5 seconds hold  ASSESSMENT: CLINICAL IMPRESSION: Pt reported one incident of knee instability outside of PT but no instability noted during session including gait with cane.  Pt is able to actively flex knee better. She continues to need daily PT to work on range & strength following manipulation.   OBJECTIVE IMPAIRMENTS: Abnormal gait, decreased activity tolerance, decreased balance, decreased endurance, decreased knowledge of condition, decreased knowledge of use of DME, decreased mobility, difficulty walking, decreased ROM, decreased strength, increased edema, postural dysfunction, obesity, and pain.   ACTIVITY LIMITATIONS: carrying, lifting, bending, sitting, standing, squatting, sleeping, stairs, transfers, bed mobility, and locomotion level  PARTICIPATION LIMITATIONS: meal prep, cleaning, driving, community activity, and church  PERSONAL FACTORS: Fitness, Past/current experiences, Time since onset of injury/illness/exacerbation, and 3+ comorbidities: see PMH  are also affecting patient's functional outcome.   REHAB POTENTIAL: Good  CLINICAL DECISION MAKING: Stable/uncomplicated  EVALUATION COMPLEXITY: Low   GOALS: Goals reviewed with patient? Yes  SHORT TERM GOALS: (target date for Short term goals 11/10/2022)   1.  Patient will demonstrate independent use of home exercise program to maintain progress from in clinic treatments. Baseline: see objective data Goal status:  MET 10/31/2022  2. PROM right knee ext -5* and flexion 90* Baseline: see objective data Goal status: MET 10/31/2022  3. Interim FOTO >32% Baseline: see objective data  Goal status:  MET 10/31/2022  LONG TERM GOALS: (target dates for all long term goals  12/21/2022 ) (for updated cert s/p manipulation)   1. Patient will demonstrate/report pain at worst less than or equal to 2/10 to facilitate minimal  limitation in daily activity secondary to pain symptoms. Baseline: see objective data Goal status: Ongoing 11/06/2022   2. Patient will demonstrate independent use of home exercise program to facilitate ability to maintain/progress functional gains from skilled physical therapy services. Baseline: see objective data Goal status: Ongoing 11/06/2022   3. Patient will demonstrate FOTO outcome > or = 47 % to indicate reduced disability due to condition. Baseline: see objective data Goal status:  MET 10/31/2022   4.  Patient will demonstrate right knee MMT 4/5 throughout to faciltiate usual transfers, stairs, squatting at [facility] for daily life.  Baseline: see objective data Goal status: Ongoing 11/06/2022   5.  Patient right knee AROM ext standing -2* and flexion seated or supine 100* Baseline: see objective data Goal status: Ongoing 11/06/2022   6.  patient amb with LRAD >500' and negotiates ramps/curbs/stairs modified independent.  Baseline: see objective data Goal status: Ongoing 11/06/2022   PLAN:  PT FREQUENCY:  5x/week for 2 weeks, then drop to 2-3x/week for an additional 3 weeks   PT DURATION: 5 weeks   PLANNED INTERVENTIONS: Therapeutic exercises, Therapeutic activity, Neuro Muscular re-education, Balance training, Gait training, Patient/Family education, Joint mobilization, Stair training, DME instructions, Dry Needling, Electrical stimulation, Traction, Cryotherapy, vasopneumatic deviceMoist heat, Taping, Ultrasound, Ionotophoresis 4mg /ml Dexamethasone, and aquatic therapy, Manual therapy.  All included unless contraindicated  PLAN FOR NEXT SESSION:  continue with Manual techniques & exercises for mobiltiy gains.  Balance and strength improvements, SPC in clinic, vaso to end prn    Vladimir Faster, PT, DPT 11/22/2022, 3:44 PM

## 2022-11-22 NOTE — Telephone Encounter (Signed)
Pt need gabapentin refills, pt states she unable to make it in the office at this time pt has had a knee replacement surgery

## 2022-11-23 ENCOUNTER — Ambulatory Visit (INDEPENDENT_AMBULATORY_CARE_PROVIDER_SITE_OTHER): Payer: Medicare Other | Admitting: Physical Therapy

## 2022-11-23 ENCOUNTER — Encounter: Payer: Medicare Other | Admitting: Rehabilitative and Restorative Service Providers"

## 2022-11-23 DIAGNOSIS — R600 Localized edema: Secondary | ICD-10-CM

## 2022-11-23 DIAGNOSIS — M6281 Muscle weakness (generalized): Secondary | ICD-10-CM | POA: Diagnosis not present

## 2022-11-23 DIAGNOSIS — R2689 Other abnormalities of gait and mobility: Secondary | ICD-10-CM

## 2022-11-23 DIAGNOSIS — M25661 Stiffness of right knee, not elsewhere classified: Secondary | ICD-10-CM | POA: Diagnosis not present

## 2022-11-23 DIAGNOSIS — M25561 Pain in right knee: Secondary | ICD-10-CM | POA: Diagnosis not present

## 2022-11-23 DIAGNOSIS — R2681 Unsteadiness on feet: Secondary | ICD-10-CM

## 2022-11-23 NOTE — Therapy (Signed)
OUTPATIENT PHYSICAL THERAPY  TREATMENT   Patient Name: Brier Firebaugh Widener MRN: [id number] DOB:01-08-1951, 72 y.o., female Today's Date: 11/23/2022   END OF SESSION:  PT End of Session - 11/23/22 0918     Visit Number 20    Number of Visits 35    Date for PT Re-Evaluation 12/21/22    Authorization Type [employer] [facility]    Progress Note Due on Visit 25    PT Start Time 0920    PT Stop Time 1010    PT Time Calculation (min) 50 min    Activity Tolerance Patient tolerated treatment well    Behavior During Therapy [facility] for tasks assessed/performed                           Past Medical History:  Diagnosis Date   Anxiety    Arthritis    bilateral knees   Asthma    childhood, inhaler for wheezing PRN   Autonomic neuropathy    diabetic   Cataract    Celiac disease    Constipation    Depression    Diabetes mellitus    type II, Hemoglobin A1C 9.9 10/05/2011   Diabetic neuropathy (HCC)    Diverticulosis    Fatty liver    Gastroparesis    GERD (gastroesophageal reflux disease)    History of claustrophobia    with MRI tests   Hyperlipidemia    Hypertension    IBS (irritable bowel syndrome)    Kidney stones    Personal history of colonic polyps 03/2010   hyperplastic   PONV (postoperative nausea and vomiting)    Shingles 2021   Past Surgical History:  Procedure Laterality Date   APPENDECTOMY     CATARACT EXTRACTION Right 04/29/2018   CATARACT EXTRACTION Left 03/2018   COLONOSCOPY     DILATATION & CURRETTAGE/HYSTEROSCOPY WITH RESECTOCOPE N/A 03/24/2013   Procedure: DILATATION & CURETTAGE, HYSTEROSCOPY WITH RESECTION;  Surgeon: Levi Aland, MD;  Location: [facility];  Service: Gynecology;  Laterality: N/A;   HYSTEROSCOPY WITH D & C N/A 11/08/2015   Procedure: DILATATION AND CURETTAGE /HYSTEROSCOPY;  Surgeon: Levi Aland, MD;  Location: [facility];  Service: Gynecology;  Laterality: N/A;   left breast cyst removal  2000   LEFT HEART CATH AND CORONARY  ANGIOGRAPHY N/A 10/31/2018   Procedure: LEFT HEART CATH AND CORONARY ANGIOGRAPHY;  Surgeon: Corky Crafts, MD;  Location: [facility] INVASIVE CV LAB;  Service: Cardiovascular;  Laterality: N/A;   TOTAL KNEE ARTHROPLASTY Right 09/12/2022   Procedure: RIGHT TOTAL KNEE ARTHROPLASTY;  Surgeon: Kathryne Hitch, MD;  Location: [facility];  Service: Orthopedics;  Laterality: Right;   TUBAL LIGATION     Patient Active Problem List   Diagnosis Date Noted   Status post total right knee replacement 09/12/2022   Degenerative arthritis of left knee 08/07/2022   Chronic rhinitis 04/18/2022   Gastroesophageal reflux disease 04/18/2022   Vitamin D deficiency 04/13/2022   Morbid obesity (HCC) 04/13/2022   Peripheral sensory neuropathy due to type 2 diabetes mellitus (HCC) 04/05/2020   Bilateral lower extremity edema 01/20/2020   Family history of malignant neoplasm of endometrium 10/08/2018   Celiac disease 02/14/2018   Osteopenia 02/14/2018   Mild intermittent asthma 02/14/2018   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) 02/14/2018   Class 2 obesity due to excess calories with body mass index (BMI) of 37.0 to 37.9 in adult 02/14/2018   Fibroma  of tongue 06/06/2016   Renal calculi 01/13/2016   Degenerative cervical disc 07/27/2015   OSA (obstructive sleep apnea) 02/28/2011   Constipation, slow transit 09/23/2010   Type 2 diabetes mellitus with complication, with long-term current use of insulin (HCC) 09/23/2010   FH: colon cancer 09/23/2010   Personal history of colonic polyps 09/23/2010   Combined hyperlipidemia associated with type 2 diabetes mellitus (HCC) 01/29/2009   Diabetic gastroparesis (HCC) 02/27/2007   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 Mardelle Matte, MD   REFERRING PROVIDER: Vanita Panda. Magnus Ivan, MD  REFERRING DIAG: [phone] (ICD-10-CM) - Status post total  right knee replacement   THERAPY DIAG:  Stiffness of right knee, not elsewhere classified  Acute pain of right knee  Muscle weakness (generalized)  Localized edema  Other abnormalities of gait and mobility  Unsteadiness on feet  Rationale for Evaluation and Treatment: Rehabilitation  ONSET DATE: 09/12/2022  SUBJECTIVE:   SUBJECTIVE STATEMENT: Feels better today than yesterday; working on getting her knee ROM back  PERTINENT HISTORY: Right TKA 09/12/2022, DM2, asthma, OA, peripheral neuropathy, osteopenia, shingles  PAIN:  NPRS scale:today did not rate "not bad"/10 Pain location: Rt  Pain description: throbbing Aggravating factors: nighttime Relieving factors: Tylenol, ice  PRECAUTIONS: Fall  WEIGHT BEARING RESTRICTIONS: No  FALLS:  Has patient fallen in last 6 months? No  LIVING ENVIRONMENT: Lives with: lives with their spouse Lives in: [place] with master handicap accessible bath & bedroom ramped entrance. Does not go upstairs.  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   PATIENT SURVEYS:  10/31/2022: FOTO visit 10 52%  Eval: FOTO intake:  21%  predicted:  47%  COGNITION:10/11/2022 Overall cognitive status: WFL    SENSATION: 10/11/2022 WFL  EDEMA:  10/11/2022 Circumferential:  LLE: above knee 51.5cm,  around knee 50.2 cm, below knee 42.5 cm RLE: above knee 55.5 cm,  around knee 53.8 cm, below knee 46.2 cm  POSTURE: 10/11/2022  rounded shoulders, forward head, flexed trunk , and weight  shift left  PALPATION: 10/11/2022 Right knee tenderness along joint line & incision.   LOWER EXTREMITY ROM:   ROM Right eval Right 10/13/22 Right 10/16/22 Right 10/25/22 Right 10/31/22 Right 11/08/2022 Right 11/16/22 Right 11/17/22 PROM Right 11/20/22 Right 11/23/22  Knee flexion Seated A: 64* P: 74* Active 75 AA 84* Seated P: 87* A: 77* Seated P: 90* A:83* Supine AROM heel slide: 80    Supine and sitting 90*  Supine 87 deg  Sitting 99 deg s but not pain Seated P: 100* AA: 95* Supine A: 96 P: 107  Knee extension Seated A: LAQ -22* Seated with LE extended P: -11* Active -7 Supine A: -5*  Supine P: -1* A: quad set -3* SAQ -6* Seated AROM LAQ: -14  Supine quad set AROM -6 Seated with heel 14*, with overpressure 12*  Supine P: -1* A: quad set -4*    (Blank rows = not tested)  LOWER EXTREMITY MMT:  MMT Right 10/11/2022 Right 11/08/2022 Right  11/16/22  Hip flexion     Hip extension     Hip abduction     Hip adduction     Hip internal rotation     Hip external rotation     Knee flexion 3-/5 5/5 in available range   Knee extension 3-/5 4-+/5 in available range 5 in available ROM  Ankle dorsiflexion     Ankle plantarflexion     Ankle inversion     Ankle eversion      (Blank rows = not tested)  FUNCTIONAL TESTS:  11/08/2022:  24 seconds with FWW  10/11/2022 18 inch chair transfer: requires BUE support to arise from 22" w/c to RW for stabilization TUG with RW: 41.75 sec  GAIT: 11/08/2022:  Ambulation into clinic c FWW.   10/11/2022 Distance walked: 20' Assistive device utilized: Environmental consultant - 2 wheeled Level of assistance: SBA Comments: Antalgic gait pattern with decreased stance duration right lower extremity, right knee flexed in stance and minimal increase flexion for swing phase and flexed trunk.                     TODAY'S TREATMENT                                                DATE: 11/23/2022 Therapeutic Exercise: NuStep L5 x 8 min; seat 9 Leg press 81#  3x10; flexion holds x 60 sec between sets Seated AA Rt knee flexion 10 x 10 sec hold; LLE providing assist Seated Rt LAQ 3x10; 4# AA heel slides on Rt 2x10 ROM measurements - see above  Manual Seated Rt knee flexion PROM to tolerance   Modalities Vaso x 10 min to Rt knee; mod pressure; 34 deg   TREATMENT                                                DATE: 11/22/2022 Therapeutic Exercise: SciFit bike with BLEs & BUEs level 2 seat 9 for 8 mins  Seated on raised mat table with Rt foot on rolling stool: active knee flexion rolling stool 20 reps 5 sec hold; then 10 reps with PT passively increasing knee flexion with pt isometric hold 3 sec. Standing RLE TKE green Theraband 10 reps 1st set only TKE, 2nd set SLS with TKE tapping LLE on cone with cane LUE & light touch on raised mat table RUE. Gastroc incline stretch bilateral 30 sec x 3  Leg press in max bending tolerated - double leg 81 lbs x 15 , single leg Rt 43 lbs 2 x 15.  Rest in bending for stretch   Gait Training: Pt amb 60' X 2 with cane stand alone tip including scanning with supervision.   Manual Therapy: Seated Rt knee flexion c mobilization c movement IR/distraction.  Contract relax techniques into flexion Rt knee.   Vaso  right knee medium compression 34* with elevation 10 minutes.   TREATMENT                                                DATE: 11/21/2022 Therapeutic Exercise: SciFit bike  with BLEs & BUEs level 2 seat 9 for 8 mins  Gastroc incline stretch bilateral 30 sec x 3  Leg press in max bending tolerated - double leg 75 lbs x 15 , single leg Rt   43 lbs 2 x 15.  Rest in bending for stretch Seated Rt knee flexion c Lt leg overpressure 15 second hold x 5 Supine Rt knee heel slide with strap assist at top 5 sec hold x 10  Supine Rt leg quad set 5 sec hold x 10   Manual Therapy: Seated Rt knee flexion c mobilization c movement IR/distraction.  Contract relax techniques into flexion Rt knee.   Vaso  right knee  medium compression 34* with elevation 10 minutes.   TREATMENT                                                DATE: 11/20/2022 Therapeutic Exercise: SciFit bike with BLEs & BUEs level 1  seat 9 for 4 min, then seat 8 for 4 min. Seated heel slide with Rt foot on pillow case to reduce floor resistance and sheet tied around ankle for BUEs to assist & LLE assisted knee flexion 10 reps  LAQ assisted w/ strap assisted end range 2x10 cues for setup Supine heel slide with lower leg on 18 " ball with strap assist 10 reps Knee flexion end range manual assist 10 sec hold Supine RLE active flexion with femur held vertical with towel - 10 reps  Manual Therapy: Seated; passive physiological movement knee flex/ext RLE to pt tolerance, oscillations to reduce muscle guarding, gentle distraction to improve flexion with ROM Supine; passive physiological movement knee flex/ext RLE to pt tolerance  Vaso right knee medium compression 34* with elevation 10 minutes.      HOME EXERCISE PROGRAM: Access Code: [email or web address] URL: [email or web address] Date: 11/06/2022 Prepared by: Vladimir Faster  Exercises - Ankle Alphabet in Elevation  - 2-4 x daily - 7 x weekly - 1 sets - 1 reps - supine quad set with towel roll under ankle  - 2-4 x daily - 7 x weekly - 2 sets - 10 reps - 5 seconds hold - Supine Heel Slide with Strap  - 2-3 x daily - 7 x weekly - 2-3 sets - 10 reps - 5 seconds hold - Seated Knee Flexion Extension AROM   - 2-4 x daily - 7 x weekly - 2-3 sets - 10 reps - 5 seconds hold - Supine Knee Extension Strengthening  - 2-4 x daily - 7 x weekly - 2-3 sets - 10 reps - 5 seconds hold - Seated Long Arc Quad  - 2-4 x daily - 7 x weekly - 2-3 sets - 10 reps - 5 seconds hold - standing calf stretch with forefoot on small step or brick  - 1 x daily - 7 x weekly - 1 sets - 3 reps - 30 seconds hold - Seated Table Hamstring Stretch  - 1 x daily - 7 x weekly - 1 sets - 3 reps - 30 seconds hold - Supine  Quadriceps Stretch with Strap on Table  - 1 x daily - 7 x weekly - 1 sets - 3 reps - 30 seconds hold - Standing Tandem Balance with Counter Support  - 1 x daily - 7 x weekly - 1 sets - 2 reps - 30 seconds hold -  Heel raises near counter  - 1 x daily - 7 x weekly - 2 sets - 10 reps - 5 seconds hold  ASSESSMENT: CLINICAL IMPRESSION: Pt with good improvement in ROM today and overall progressing well with PT.  Does have some difficulty with TKE and will continue to work on quad strengthening and ROM.  Will continue to benefit from PT to maximize function.    OBJECTIVE IMPAIRMENTS: Abnormal gait, decreased activity tolerance, decreased balance, decreased endurance, decreased knowledge of condition, decreased knowledge of use of DME, decreased mobility, difficulty walking, decreased ROM, decreased strength, increased edema, postural dysfunction, obesity, and pain.   ACTIVITY LIMITATIONS: carrying, lifting, bending, sitting, standing, squatting, sleeping, stairs, transfers, bed mobility, and locomotion level  PARTICIPATION LIMITATIONS: meal prep, cleaning, driving, community activity, and church  PERSONAL FACTORS: Fitness, Past/current experiences, Time since onset of injury/illness/exacerbation, and 3+ comorbidities: see PMH  are also affecting patient's functional outcome.   REHAB POTENTIAL: Good  CLINICAL DECISION MAKING: Stable/uncomplicated  EVALUATION COMPLEXITY: Low   GOALS: Goals reviewed with patient? Yes  SHORT TERM GOALS: (target date for Short term goals 11/10/2022)   1.  Patient will demonstrate independent use of home exercise program to maintain progress from in clinic treatments. Baseline: see objective data Goal status:  MET 10/31/2022  2. PROM right knee ext -5* and flexion 90* Baseline: see objective data Goal status: MET 10/31/2022  3. Interim FOTO >32% Baseline: see objective data  Goal status:  MET 10/31/2022  LONG TERM GOALS: (target dates for all long term goals   12/21/2022 ) (for updated cert s/p manipulation)   1. Patient will demonstrate/report pain at worst less than or equal to 2/10 to facilitate minimal limitation in daily activity secondary to pain symptoms. Baseline: see objective data Goal status: Ongoing 11/06/2022   2. Patient will demonstrate independent use of home exercise program to facilitate ability to maintain/progress functional gains from skilled physical therapy services. Baseline: see objective data Goal status: Ongoing 11/06/2022   3. Patient will demonstrate FOTO outcome > or = 47 % to indicate reduced disability due to condition. Baseline: see objective data Goal status:  MET 10/31/2022   4.  Patient will demonstrate right knee MMT 4/5 throughout to faciltiate usual transfers, stairs, squatting at [facility] for daily life.  Baseline: see objective data Goal status: Ongoing 11/06/2022   5.  Patient right knee AROM ext standing -2* and flexion seated or supine 100* Baseline: see objective data Goal status: Ongoing 11/06/2022   6.  patient amb with LRAD >500' and negotiates ramps/curbs/stairs modified independent.  Baseline: see objective data Goal status: Ongoing 11/06/2022   PLAN:  PT FREQUENCY:  5x/week for 2 weeks, then drop to 2-3x/week for an additional 3 weeks   PT DURATION: 5 weeks   PLANNED INTERVENTIONS: Therapeutic exercises, Therapeutic activity, Neuro Muscular re-education, Balance training, Gait training, Patient/Family education, Joint mobilization, Stair training, DME instructions, Dry Needling, Electrical stimulation, Traction, Cryotherapy, vasopneumatic deviceMoist heat, Taping, Ultrasound, Ionotophoresis 4mg /ml Dexamethasone, and aquatic therapy, Manual therapy.  All included unless contraindicated  PLAN FOR NEXT SESSION:  continue to maximize ROM, needs to work on [employer], exercises for mobiltiy gains.  Balance and strength improvements, SPC in clinic, vaso to end prn    Clarita Crane, PT, DPT 11/23/22 10:13  AM

## 2022-11-23 NOTE — Therapy (Signed)
OUTPATIENT PHYSICAL THERAPY  TREATMENT   Patient Name: Tekila Caillouet Gosney MRN: [id number] DOB:1950-11-01, 72 y.o., female Today's Date: 11/24/2022   END OF SESSION:  PT End of Session - 11/24/22 1058     Visit Number 21    Number of Visits 35    Date for PT Re-Evaluation 12/21/22    Authorization Type [employer] [facility]    Progress Note Due on Visit 25    PT Start Time 1100    PT Stop Time 1139    PT Time Calculation (min) 39 min    Activity Tolerance Patient tolerated treatment well;No increased pain    Behavior During Therapy [facility] for tasks assessed/performed                            Past Medical History:  Diagnosis Date   Anxiety    Arthritis    bilateral knees   Asthma    childhood, inhaler for wheezing PRN   Autonomic neuropathy    diabetic   Cataract    Celiac disease    Constipation    Depression    Diabetes mellitus    type II, Hemoglobin A1C 9.9 10/05/2011   Diabetic neuropathy (HCC)    Diverticulosis    Fatty liver    Gastroparesis    GERD (gastroesophageal reflux disease)    History of claustrophobia    with MRI tests   Hyperlipidemia    Hypertension    IBS (irritable bowel syndrome)    Kidney stones    Personal history of colonic polyps 03/2010   hyperplastic   PONV (postoperative nausea and vomiting)    Shingles 2021   Past Surgical History:  Procedure Laterality Date   APPENDECTOMY     CATARACT EXTRACTION Right 04/29/2018   CATARACT EXTRACTION Left 03/2018   COLONOSCOPY     DILATATION & CURRETTAGE/HYSTEROSCOPY WITH RESECTOCOPE N/A 03/24/2013   Procedure: DILATATION & CURETTAGE, HYSTEROSCOPY WITH RESECTION;  Surgeon: Levi Aland, MD;  Location: [facility];  Service: Gynecology;  Laterality: N/A;   HYSTEROSCOPY WITH D & C N/A 11/08/2015   Procedure: DILATATION AND CURETTAGE /HYSTEROSCOPY;  Surgeon: Levi Aland, MD;  Location: [facility];  Service: Gynecology;  Laterality: N/A;   left breast cyst removal  2000   LEFT HEART CATH  AND CORONARY ANGIOGRAPHY N/A 10/31/2018   Procedure: LEFT HEART CATH AND CORONARY ANGIOGRAPHY;  Surgeon: Corky Crafts, MD;  Location: [facility] INVASIVE CV LAB;  Service: Cardiovascular;  Laterality: N/A;   TOTAL KNEE ARTHROPLASTY Right 09/12/2022   Procedure: RIGHT TOTAL KNEE ARTHROPLASTY;  Surgeon: Kathryne Hitch, MD;  Location: [facility] OR;  Service: Orthopedics;  Laterality: Right;   TUBAL LIGATION     Patient Active Problem List   Diagnosis Date Noted   Status post total right knee replacement 09/12/2022   Degenerative arthritis of left knee 08/07/2022   Chronic rhinitis 04/18/2022   Gastroesophageal reflux disease 04/18/2022   Vitamin D deficiency 04/13/2022   Morbid obesity (HCC) 04/13/2022   Peripheral sensory neuropathy due to type 2 diabetes mellitus (HCC) 04/05/2020   Bilateral lower extremity edema 01/20/2020   Family history of malignant neoplasm of endometrium 10/08/2018   Celiac disease 02/14/2018   Osteopenia 02/14/2018   Mild intermittent asthma 02/14/2018   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) 02/14/2018   Class 2 obesity due to excess calories with body mass index (BMI) of 37.0 to 37.9 in adult 02/14/2018  Fibroma of tongue 06/06/2016   Renal calculi 01/13/2016   Degenerative cervical disc 07/27/2015   OSA (obstructive sleep apnea) 02/28/2011   Constipation, slow transit 09/23/2010   Type 2 diabetes mellitus with complication, with long-term current use of insulin (HCC) 09/23/2010   FH: colon cancer 09/23/2010   Personal history of colonic polyps 09/23/2010   Combined hyperlipidemia associated with type 2 diabetes mellitus (HCC) 01/29/2009   Diabetic gastroparesis (HCC) 02/27/2007   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 Mardelle Matte, MD   REFERRING PROVIDER: Vanita Panda. Magnus Ivan, MD  REFERRING DIAG: [phone] (ICD-10-CM) - Status  post total right knee replacement   THERAPY DIAG:  Stiffness of right knee, not elsewhere classified  Acute pain of right knee  Muscle weakness (generalized)  Localized edema  Other abnormalities of gait and mobility  Rationale for Evaluation and Treatment: Rehabilitation  ONSET DATE: 09/12/2022  SUBJECTIVE:   SUBJECTIVE STATEMENT: Feels a little sore today, 5/10. Feels manipulation was very helpful with pain and stiffness.    PERTINENT HISTORY: Right TKA 09/12/2022, DM2, asthma, OA, peripheral neuropathy, osteopenia, shingles  PAIN:  NPRS scale:5/10 Pain location: Rt  Pain description: throbbing Aggravating factors: nighttime Relieving factors: Tylenol, ice  PRECAUTIONS: Fall  WEIGHT BEARING RESTRICTIONS: No  FALLS:  Has patient fallen in last 6 months? No  LIVING ENVIRONMENT: Lives with: lives with their spouse Lives in: [place] with master handicap accessible bath & bedroom ramped entrance. Does not go upstairs.  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   PATIENT SURVEYS:  10/31/2022: FOTO visit 10 52%  Eval: FOTO intake:  21%  predicted:  47%  COGNITION:10/11/2022 Overall cognitive status: WFL    SENSATION: 10/11/2022 WFL  EDEMA:  10/11/2022 Circumferential:  LLE: above knee 51.5cm,  around knee 50.2 cm, below knee 42.5 cm RLE: above knee 55.5 cm,  around knee 53.8 cm, below knee 46.2 cm  POSTURE: 10/11/2022  rounded shoulders, forward head, flexed trunk , and weight shift  left  PALPATION: 10/11/2022 Right knee tenderness along joint line & incision.   LOWER EXTREMITY ROM:   ROM Right eval Right 10/13/22 Right 10/16/22 Right 10/25/22 Right 10/31/22 Right 11/08/2022 Right 11/16/22 Right 11/17/22 PROM Right 11/20/22 Right 11/23/22  Knee flexion Seated A: 64* P: 74* Active 75 AA 84* Seated P: 87* A: 77* Seated P: 90* A:83* Supine AROM heel slide: 80    Supine and sitting 90*  Supine 87 deg  Sitting 99 deg s but not pain Seated P: 100* AA: 95* Supine A: 96 P: 107  Knee extension Seated A: LAQ -22* Seated with LE extended P: -11* Active -7 Supine A: -5*  Supine P: -1* A: quad set -3* SAQ -6* Seated AROM LAQ: -14  Supine quad set AROM -6 Seated with heel 14*, with overpressure 12*  Supine P: -1* A: quad set -4*    (Blank rows = not tested)  LOWER EXTREMITY MMT:  MMT Right 10/11/2022 Right 11/08/2022 Right 11/16/22  Hip  flexion     Hip extension     Hip abduction     Hip adduction     Hip internal rotation     Hip external rotation     Knee flexion 3-/5 5/5 in available range   Knee extension 3-/5 4-+/5 in available range 5 in available ROM  Ankle dorsiflexion     Ankle plantarflexion     Ankle inversion     Ankle eversion      (Blank rows = not tested)  FUNCTIONAL TESTS:  11/08/2022:  24 seconds with FWW  10/11/2022 18 inch chair transfer: requires BUE support to arise from 22" w/c to RW for stabilization TUG with RW: 41.75 sec  GAIT: 11/08/2022:  Ambulation into clinic c FWW.   10/11/2022 Distance walked: 20' Assistive device utilized: Environmental consultant - 2 wheeled Level of assistance: SBA Comments: Antalgic gait pattern with decreased stance duration right lower extremity, right knee flexed in stance and minimal increase flexion for swing phase and flexed trunk.                     TODAY'S TREATMENT [facility] Adult PT Treatment:                                                DATE: 11/24/22 Therapeutic Exercise: Nu step LE/UE seat at 9,  5 min, seated 8 for 3 additional minutes  LAQ 4# 2x15 seated Supine quad set with heel prop for inc ROM x25 heavy tactile/verbal cues, increased time to mitigate hip compensations Red band TKE standing using RW x10 Red band TKE standing at wall for external cue x10  Manual Therapy: Seated knee flexion/ext PROM to pt tolerance, able to achieve 105deg passively   TREATMENT                                                DATE: 11/23/2022 Therapeutic Exercise: NuStep L5 x 8 min; seat 9 Leg press 81# 3x10; flexion holds x 60 sec between sets Seated AA Rt knee flexion 10 x 10 sec hold; LLE providing assist Seated Rt LAQ 3x10; 4# AA heel slides on Rt 2x10 ROM measurements - see above  Manual Seated Rt knee flexion PROM to tolerance   Modalities Vaso x 10 min to Rt knee; mod pressure; 34 deg   TREATMENT                                                DATE: 11/22/2022 Therapeutic Exercise: SciFit bike with BLEs & BUEs level 2 seat 9 for 8 mins  Seated on raised mat table with Rt foot on rolling stool: active knee flexion rolling stool 20 reps 5 sec hold; then 10 reps with PT passively increasing knee flexion with pt isometric hold 3 sec. Standing RLE TKE green Theraband 10 reps 1st set only TKE, 2nd set SLS with TKE tapping LLE on cone with cane LUE & light touch on raised mat table RUE. Gastroc incline stretch bilateral 30 sec x 3  Leg press in max bending tolerated - double leg 81 lbs  x 15 , single leg Rt 43 lbs 2 x 15.  Rest in bending for stretch   Gait Training: Pt amb 60' X 2 with cane stand alone tip including scanning with supervision.   Manual Therapy: Seated Rt knee flexion c mobilization c movement IR/distraction.  Contract relax techniques into flexion Rt knee.   Vaso  right knee medium compression 34* with elevation 10 minutes.   TREATMENT                                                DATE: 11/21/2022 Therapeutic Exercise: SciFit bike with BLEs & BUEs level 2 seat 9 for  8 mins  Gastroc incline stretch bilateral 30 sec x 3  Leg press in max bending tolerated - double leg 75 lbs x 15 , single leg Rt   43 lbs 2 x 15.  Rest in bending for stretch Seated Rt knee flexion c Lt leg overpressure 15 second hold x 5 Supine Rt knee heel slide with strap assist at top 5 sec hold x 10  Supine Rt leg quad set 5 sec hold x 10   Manual Therapy: Seated Rt knee flexion c mobilization c movement IR/distraction.  Contract relax techniques into flexion Rt knee.   Vaso  right knee medium compression 34* with elevation 10 minutes.   TREATMENT                                                DATE: 11/20/2022 Therapeutic Exercise: SciFit bike with BLEs & BUEs level 1  seat 9 for 4 min, then seat 8 for 4 min. Seated heel slide with Rt foot on pillow case to reduce floor resistance and sheet tied around ankle for BUEs to assist & LLE assisted knee flexion 10 reps  LAQ assisted w/ strap assisted end range 2x10 cues for setup Supine heel slide with lower leg on 18 " ball with strap assist 10 reps Knee flexion end range manual assist 10 sec hold Supine RLE active flexion with femur held vertical with towel - 10 reps  Manual Therapy: Seated; passive physiological movement knee flex/ext RLE to pt tolerance, oscillations to reduce muscle guarding, gentle distraction to improve flexion with ROM Supine; passive physiological movement knee flex/ext RLE to pt tolerance  Vaso right knee medium compression 34* with elevation 10 minutes.      HOME EXERCISE PROGRAM: Access Code: [email or web address] URL: [email or web address] Date: 11/06/2022 Prepared by: Vladimir Faster  Exercises - Ankle Alphabet in Elevation  - 2-4 x daily - 7 x weekly - 1 sets - 1 reps - supine quad set with towel roll under ankle  - 2-4 x daily - 7 x weekly - 2 sets - 10 reps - 5 seconds hold - Supine Heel Slide with Strap  - 2-3 x daily - 7 x weekly - 2-3 sets - 10 reps - 5 seconds hold - Seated Knee Flexion  Extension AROM   - 2-4 x daily - 7 x weekly - 2-3 sets - 10 reps - 5 seconds hold - Supine Knee Extension Strengthening  - 2-4 x daily - 7 x weekly - 2-3 sets - 10 reps - 5 seconds hold - Seated Long Arc Quad  -  2-4 x daily - 7 x weekly - 2-3 sets - 10 reps - 5 seconds hold - standing calf stretch with forefoot on small step or brick  - 1 x daily - 7 x weekly - 1 sets - 3 reps - 30 seconds hold - Seated Table Hamstring Stretch  - 1 x daily - 7 x weekly - 1 sets - 3 reps - 30 seconds hold - Supine Quadriceps Stretch with Strap on Table  - 1 x daily - 7 x weekly - 1 sets - 3 reps - 30 seconds hold - Standing Tandem Balance with Counter Support  - 1 x daily - 7 x weekly - 1 sets - 2 reps - 30 seconds hold - Heel raises near counter  - 1 x daily - 7 x weekly - 2 sets - 10 reps - 5 seconds hold  ASSESSMENT: CLINICAL IMPRESSION: Pt arrives w/ 5/10 pain, some soreness but continues to endorse gradual improvement in mobility and pain. Knee flexion ROM comparable to yesterday's session. Continuing to work on post operative deficits, focusing more on quad activation today as she demonstrates heavy compensations at hip with attempts to isolate quad. Most benefit from utilizing wall as external cue/stabilizer at end of session during banded TKE. Reports resolution of pain on departure, no adverse events. Declines vaso today, encouraged icing at home post session. Recommend continuing along current POC in order to address relevant deficits and improve functional tolerance. Pt departs today's session in no acute distress, all voiced questions/concerns addressed appropriately from PT perspective.      OBJECTIVE IMPAIRMENTS: Abnormal gait, decreased activity tolerance, decreased balance, decreased endurance, decreased knowledge of condition, decreased knowledge of use of DME, decreased mobility, difficulty walking, decreased ROM, decreased strength, increased edema, postural dysfunction, obesity, and pain.    ACTIVITY LIMITATIONS: carrying, lifting, bending, sitting, standing, squatting, sleeping, stairs, transfers, bed mobility, and locomotion level  PARTICIPATION LIMITATIONS: meal prep, cleaning, driving, community activity, and church  PERSONAL FACTORS: Fitness, Past/current experiences, Time since onset of injury/illness/exacerbation, and 3+ comorbidities: see PMH  are also affecting patient's functional outcome.   REHAB POTENTIAL: Good  CLINICAL DECISION MAKING: Stable/uncomplicated  EVALUATION COMPLEXITY: Low   GOALS: Goals reviewed with patient? Yes  SHORT TERM GOALS: (target date for Short term goals 11/10/2022)   1.  Patient will demonstrate independent use of home exercise program to maintain progress from in clinic treatments. Baseline: see objective data Goal status:  MET 10/31/2022  2. PROM right knee ext -5* and flexion 90* Baseline: see objective data Goal status: MET 10/31/2022  3. Interim FOTO >32% Baseline: see objective data  Goal status:  MET 10/31/2022  LONG TERM GOALS: (target dates for all long term goals  12/21/2022 ) (for updated cert s/p manipulation)   1. Patient will demonstrate/report pain at worst less than or equal to 2/10 to facilitate minimal limitation in daily activity secondary to pain symptoms. Baseline: see objective data Goal status: Ongoing 11/06/2022   2. Patient will demonstrate independent use of home exercise program to facilitate ability to maintain/progress functional gains from skilled physical therapy services. Baseline: see objective data Goal status: Ongoing 11/06/2022   3. Patient will demonstrate FOTO outcome > or = 47 % to indicate reduced disability due to condition. Baseline: see objective data Goal status:  MET 10/31/2022   4.  Patient will demonstrate right knee MMT 4/5 throughout to faciltiate usual transfers, stairs, squatting at [facility] for daily life.  Baseline: see objective data Goal status: Ongoing 11/06/2022  5.  Patient  right knee AROM ext standing -2* and flexion seated or supine 100* Baseline: see objective data Goal status: Ongoing 11/06/2022   6.  patient amb with LRAD >500' and negotiates ramps/curbs/stairs modified independent.  Baseline: see objective data Goal status: Ongoing 11/06/2022   PLAN:  PT FREQUENCY:  5x/week for 2 weeks, then drop to 2-3x/week for an additional 3 weeks   PT DURATION: 5 weeks   PLANNED INTERVENTIONS: Therapeutic exercises, Therapeutic activity, Neuro Muscular re-education, Balance training, Gait training, Patient/Family education, Joint mobilization, Stair training, DME instructions, Dry Needling, Electrical stimulation, Traction, Cryotherapy, vasopneumatic deviceMoist heat, Taping, Ultrasound, Ionotophoresis 4mg /ml Dexamethasone, and aquatic therapy, Manual therapy.  All included unless contraindicated  PLAN FOR NEXT SESSION:  continue to maximize ROM, needs to work on [employer], exercises for mobiltiy gains.  Balance and strength improvements, SPC in clinic, vaso to end prn      Ashley Murrain PT, DPT 11/24/2022 11:45 AM

## 2022-11-24 ENCOUNTER — Ambulatory Visit (INDEPENDENT_AMBULATORY_CARE_PROVIDER_SITE_OTHER): Payer: Medicare Other | Admitting: Physical Therapy

## 2022-11-24 ENCOUNTER — Encounter: Payer: Self-pay | Admitting: Physical Therapy

## 2022-11-24 ENCOUNTER — Other Ambulatory Visit: Payer: Self-pay | Admitting: Podiatry

## 2022-11-24 ENCOUNTER — Encounter: Payer: Medicare Other | Admitting: Rehabilitative and Restorative Service Providers"

## 2022-11-24 DIAGNOSIS — M6281 Muscle weakness (generalized): Secondary | ICD-10-CM | POA: Diagnosis not present

## 2022-11-24 DIAGNOSIS — M25661 Stiffness of right knee, not elsewhere classified: Secondary | ICD-10-CM

## 2022-11-24 DIAGNOSIS — R600 Localized edema: Secondary | ICD-10-CM

## 2022-11-24 DIAGNOSIS — M25561 Pain in right knee: Secondary | ICD-10-CM

## 2022-11-24 DIAGNOSIS — R2689 Other abnormalities of gait and mobility: Secondary | ICD-10-CM

## 2022-11-25 ENCOUNTER — Other Ambulatory Visit: Payer: Self-pay | Admitting: Bariatrics

## 2022-11-25 DIAGNOSIS — E559 Vitamin D deficiency, unspecified: Secondary | ICD-10-CM

## 2022-11-27 ENCOUNTER — Encounter: Payer: Self-pay | Admitting: Physical Therapy

## 2022-11-27 ENCOUNTER — Ambulatory Visit (INDEPENDENT_AMBULATORY_CARE_PROVIDER_SITE_OTHER): Payer: Medicare Other | Admitting: Physical Therapy

## 2022-11-27 ENCOUNTER — Other Ambulatory Visit (INDEPENDENT_AMBULATORY_CARE_PROVIDER_SITE_OTHER): Payer: Self-pay | Admitting: Bariatrics

## 2022-11-27 ENCOUNTER — Other Ambulatory Visit (INDEPENDENT_AMBULATORY_CARE_PROVIDER_SITE_OTHER): Payer: Medicare Other | Admitting: Podiatry

## 2022-11-27 DIAGNOSIS — R600 Localized edema: Secondary | ICD-10-CM | POA: Diagnosis not present

## 2022-11-27 DIAGNOSIS — M25661 Stiffness of right knee, not elsewhere classified: Secondary | ICD-10-CM

## 2022-11-27 DIAGNOSIS — R2689 Other abnormalities of gait and mobility: Secondary | ICD-10-CM

## 2022-11-27 DIAGNOSIS — M25561 Pain in right knee: Secondary | ICD-10-CM | POA: Diagnosis not present

## 2022-11-27 DIAGNOSIS — E1169 Type 2 diabetes mellitus with other specified complication: Secondary | ICD-10-CM

## 2022-11-27 DIAGNOSIS — M6281 Muscle weakness (generalized): Secondary | ICD-10-CM

## 2022-11-27 DIAGNOSIS — G5791 Unspecified mononeuropathy of right lower limb: Secondary | ICD-10-CM

## 2022-11-27 DIAGNOSIS — R2681 Unsteadiness on feet: Secondary | ICD-10-CM

## 2022-11-27 MED ORDER — GABAPENTIN 600 MG PO TABS
ORAL_TABLET | ORAL | 2 refills | Status: DC
Start: 2022-11-27 — End: 2023-08-07

## 2022-11-27 NOTE — Telephone Encounter (Signed)
Pt called again asking about getting her Gabapentin refilled. She is currently not able to make it to the office and is really needing the medication sent in.

## 2022-11-27 NOTE — Therapy (Signed)
OUTPATIENT PHYSICAL THERAPY  TREATMENT   Patient Name: Jessica [name] MRN: [id number] DOB:Sep 26, 1950, 72 y.o., female Today's Date: 11/27/2022   END OF SESSION:  PT End of Session - 11/27/22 1345     Visit Number 22    Number of Visits 35    Date for PT Re-Evaluation 12/21/22    Authorization Type [employer] [facility]    Progress Note Due on Visit 25    PT Start Time 1345    PT Stop Time 1440    PT Time Calculation (min) 55 min    Activity Tolerance Patient tolerated treatment well;No increased pain    Behavior During Therapy [facility] for tasks assessed/performed                             Past Medical History:  Diagnosis Date   Anxiety    Arthritis    bilateral knees   Asthma    childhood, inhaler for wheezing PRN   Autonomic neuropathy    diabetic   Cataract    Celiac disease    Constipation    Depression    Diabetes mellitus    type II, Hemoglobin A1C 9.9 10/05/2011   Diabetic neuropathy (HCC)    Diverticulosis    Fatty liver    Gastroparesis    GERD (gastroesophageal reflux disease)    History of claustrophobia    with MRI tests   Hyperlipidemia    Hypertension    IBS (irritable bowel syndrome)    Kidney stones    Personal history of colonic polyps 03/2010   hyperplastic   PONV (postoperative nausea and vomiting)    Shingles 2021   Past Surgical History:  Procedure Laterality Date   APPENDECTOMY     CATARACT EXTRACTION Right 04/29/2018   CATARACT EXTRACTION Left 03/2018   COLONOSCOPY     DILATATION & CURRETTAGE/HYSTEROSCOPY WITH RESECTOCOPE N/A 03/24/2013   Procedure: DILATATION & CURETTAGE, HYSTEROSCOPY WITH RESECTION;  Surgeon: Levi Aland, MD;  Location: [facility];  Service: Gynecology;  Laterality: N/A;   HYSTEROSCOPY WITH D & C N/A 11/08/2015   Procedure: DILATATION AND CURETTAGE /HYSTEROSCOPY;  Surgeon: Levi Aland, MD;  Location: [facility];  Service: Gynecology;  Laterality: N/A;   left breast cyst removal  2000   LEFT HEART CATH  AND CORONARY ANGIOGRAPHY N/A 10/31/2018   Procedure: LEFT HEART CATH AND CORONARY ANGIOGRAPHY;  Surgeon: Corky Crafts, MD;  Location: [facility] INVASIVE CV LAB;  Service: Cardiovascular;  Laterality: N/A;   TOTAL KNEE ARTHROPLASTY Right 09/12/2022   Procedure: RIGHT TOTAL KNEE ARTHROPLASTY;  Surgeon: Kathryne Hitch, MD;  Location: [facility];  Service: Orthopedics;  Laterality: Right;   TUBAL LIGATION     Patient Active Problem List   Diagnosis Date Noted   Status post total right knee replacement 09/12/2022   Degenerative arthritis of left knee 08/07/2022   Chronic rhinitis 04/18/2022   Gastroesophageal reflux disease 04/18/2022   Vitamin D deficiency 04/13/2022   Morbid obesity (HCC) 04/13/2022   Peripheral sensory neuropathy due to type 2 diabetes mellitus (HCC) 04/05/2020   Bilateral lower extremity edema 01/20/2020   Family history of malignant neoplasm of endometrium 10/08/2018   Celiac disease 02/14/2018   Osteopenia 02/14/2018   Mild intermittent asthma 02/14/2018   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) 02/14/2018   Class 2 obesity due to excess calories with body mass index (BMI) of 37.0 to 37.9 in adult  02/14/2018   Fibroma of tongue 06/06/2016   Renal calculi 01/13/2016   Degenerative cervical disc 07/27/2015   OSA (obstructive sleep apnea) 02/28/2011   Constipation, slow transit 09/23/2010   Type 2 diabetes mellitus with complication, with long-term current use of insulin (HCC) 09/23/2010   FH: colon cancer 09/23/2010   Personal history of colonic polyps 09/23/2010   Combined hyperlipidemia associated with type 2 diabetes mellitus (HCC) 01/29/2009   Diabetic gastroparesis (HCC) 02/27/2007   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 Mardelle Matte, MD   REFERRING PROVIDER: Vanita Panda. Magnus Ivan, MD  REFERRING DIAG: [phone] (ICD-10-CM) - Status  post total right knee replacement   THERAPY DIAG:  Stiffness of right knee, not elsewhere classified  Acute pain of right knee  Muscle weakness (generalized)  Localized edema  Other abnormalities of gait and mobility  Unsteadiness on feet  Rationale for Evaluation and Treatment: Rehabilitation  ONSET DATE: 09/12/2022  SUBJECTIVE:   SUBJECTIVE STATEMENT: She is driving without issues. She continues to use RW outside the house, cane in house and sometimes kitchen no device. She went to church meeting and moved positions so knee would not stiffen up.  PERTINENT HISTORY: Right TKA 09/12/2022, DM2, asthma, OA, peripheral neuropathy, osteopenia, shingles  PAIN:  NPRS scale:  3/10 on way to PT and 0/10 prior to ride.  Pain location: Rt  Pain description: throbbing Aggravating factors: nighttime Relieving factors: Tylenol, ice  PRECAUTIONS: Fall  WEIGHT BEARING RESTRICTIONS: No  FALLS:  Has patient fallen in last 6 months? No  LIVING ENVIRONMENT: Lives with: lives with their spouse Lives in: [place] with master handicap accessible bath & bedroom ramped entrance. Does not go upstairs.  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   PATIENT SURVEYS:  10/31/2022: FOTO visit 10 52%  Eval: FOTO intake:  21%  predicted:  47%  COGNITION:10/11/2022 Overall cognitive status: WFL    SENSATION: 10/11/2022 WFL  EDEMA:  10/11/2022 Circumferential:  LLE: above knee 51.5cm,  around knee 50.2 cm, below knee 42.5  cm RLE: above knee 55.5 cm,  around knee 53.8 cm, below knee 46.2 cm  POSTURE: 10/11/2022  rounded shoulders, forward head, flexed trunk , and weight shift left  PALPATION: 10/11/2022 Right knee tenderness along joint line & incision.   LOWER EXTREMITY ROM:   ROM Right eval Right 10/13/22 Right 10/16/22 Right 10/25/22 Right 10/31/22 Right 11/08/2022 Right 11/16/22 Right 11/17/22 PROM Right 11/20/22 Right 11/23/22  Knee flexion Seated A: 64* P: 74* Active 75 AA 84* Seated P: 87* A: 77* Seated P: 90* A:83* Supine AROM heel slide: 80    Supine and sitting 90*  Supine 87 deg  Sitting 99 deg s but not pain Seated P: 100* AA: 95* Supine A: 96 P: 107  Knee extension Seated A: LAQ -22* Seated with LE extended P: -11* Active -7 Supine A: -5*  Supine P: -1* A: quad set -3* SAQ -6* Seated AROM LAQ: -14  Supine quad set AROM -6  Seated with heel 14*, with overpressure 12*  Supine P: -1* A: quad set -4*    (Blank rows = not tested)  LOWER EXTREMITY MMT:  MMT Right 10/11/2022 Right 11/08/2022 Right 11/16/22  Hip flexion     Hip extension     Hip abduction     Hip adduction     Hip internal rotation     Hip external rotation     Knee flexion 3-/5 5/5 in available range   Knee extension 3-/5 4-+/5 in available range 5 in available ROM  Ankle dorsiflexion     Ankle plantarflexion     Ankle inversion     Ankle eversion      (Blank rows = not tested)  FUNCTIONAL TESTS:  11/08/2022:  24 seconds with FWW  10/11/2022 18 inch chair transfer: requires BUE support to arise from 22" w/c to RW for stabilization TUG with RW: 41.75 sec  GAIT: 11/27/2022: gait velocity with cane 2.61 ft/sec and without device 2.11 ft/sec  11/08/2022:  Ambulation into clinic c FWW.   10/11/2022 Distance walked: 20' Assistive device utilized: Environmental consultant - 2 wheeled Level of assistance: SBA Comments: Antalgic gait pattern with decreased stance duration right lower extremity, right knee flexed in stance  and minimal increase flexion for swing phase and flexed trunk.                     TODAY'S TREATMENT                                               DATE: 11/27/2022 Therapeutic Exercise: SciFit bike with BLEs & BUEs level 2 seat 9 for 8 mins  Leg press in max bending tolerated - double leg 87 lbs x 15 , single leg Rt 43 lbs 2 x 15.  Rest in bending for stretch Pt has a recumbent bike at home. PT demo & verbal cues adjusting seat for her leg length. She return demo with rocking for flexion stretch with Precor seat 6. Pt verbalized understanding. Hamstring stretch RLE long sit with strap 30 sec 2 reps LAQ 4# 2x15 seated Seated quad set with heel prop for inc ROM x25 heavy tactile/verbal cues, increased time to mitigate hip compensations Red band TKE standing with SLS 2 sets 10 reps with cane support  Attempted quad stretch supine hooklying but had back pain with position.  Therapeutic Activities: Pt amb 100' with cane safely Pt amb 100' without device safely Pt able to carry 10# basket safely. Pt reports that she was afraid to carry her laundry but this showed her that she could.   Manual Therapy: Seated knee flexion/ext PROM with overpressure and contract relax  Vaso right knee elevation 34* medium compression 10 min  TREATMENT                                               DATE: 11/24/22 Therapeutic Exercise: Nu step LE/UE seat at 9, 5 min, seated 8 for 3 additional minutes  LAQ 4# 2x15 seated Supine quad set with heel prop for inc ROM x25 heavy tactile/verbal cues, increased time to mitigate hip compensations Red band TKE standing using RW x10 Red band TKE standing at wall for external cue x10  Manual Therapy: Seated knee flexion/ext PROM to pt tolerance, able to achieve 105deg passively   TREATMENT                                                DATE: 11/23/2022 Therapeutic Exercise: NuStep L5 x 8 min; seat 9 Leg press 81# 3x10; flexion holds x 60 sec between sets Seated AA Rt  knee flexion 10 x 10 sec hold; LLE providing assist Seated Rt LAQ 3x10; 4# AA heel slides on Rt 2x10 ROM measurements - see above  Manual Seated Rt knee flexion PROM to tolerance   Modalities Vaso x 10 min to Rt knee; mod pressure; 34 deg   TREATMENT                                                DATE: 11/22/2022 Therapeutic Exercise: SciFit bike with BLEs & BUEs level 2 seat 9 for 8 mins  Seated on raised mat table with Rt foot on rolling stool: active knee flexion rolling stool 20 reps 5 sec hold; then 10 reps with PT passively increasing knee flexion with pt isometric hold 3 sec. Standing RLE TKE green Theraband 10 reps 1st set only TKE, 2nd set SLS with TKE tapping LLE on cone with cane LUE & light touch on raised mat table RUE. Gastroc incline stretch bilateral 30 sec x 3  Leg press in max bending tolerated - double leg 81 lbs x 15 , single leg Rt 43 lbs 2 x 15.  Rest in bending for stretch   Gait Training: Pt amb 60' X 2 with cane stand alone tip including scanning with supervision.   Manual Therapy: Seated Rt knee flexion c mobilization c movement IR/distraction.  Contract relax techniques into flexion Rt knee.   Vaso  right knee medium compression 34* with elevation 10 minutes.    HOME EXERCISE PROGRAM: Access Code: [email or web address] URL: [email or web address] Date: 11/06/2022 Prepared by: Vladimir Faster  Exercises - Ankle Alphabet in Elevation  - 2-4 x daily - 7 x weekly - 1 sets - 1 reps - supine quad set with towel roll under ankle  - 2-4 x daily - 7 x weekly - 2 sets - 10 reps - 5 seconds hold - Supine Heel Slide with Strap  - 2-3 x daily - 7 x weekly - 2-3 sets - 10 reps - 5 seconds hold - Seated Knee Flexion Extension AROM   - 2-4 x daily - 7 x weekly - 2-3 sets - 10 reps - 5 seconds hold - Supine Knee Extension Strengthening  - 2-4 x daily - 7 x weekly - 2-3 sets - 10 reps - 5 seconds hold - Seated Long Arc Quad  - 2-4 x daily - 7 x weekly - 2-3 sets - 10  reps - 5 seconds hold - standing calf stretch with forefoot on small step or brick  - 1 x daily - 7 x weekly - 1 sets - 3 reps - 30 seconds hold - Seated Table Hamstring Stretch  - 1 x daily - 7 x weekly - 1 sets - 3 reps - 30 seconds hold - Supine Quadriceps Stretch with Strap on Table  - 1 x daily - 7 x  weekly - 1 sets - 3 reps - 30 seconds hold - Standing Tandem Balance with Counter Support  - 1 x daily - 7 x weekly - 1 sets - 2 reps - 30 seconds hold - Heel raises near counter  - 1 x daily - 7 x weekly - 2 sets - 10 reps - 5 seconds hold  ASSESSMENT: CLINICAL IMPRESSION: Patient appears safe carrying basket like laundry at home.  Pt is improving functional range with progressive PT activities.    OBJECTIVE IMPAIRMENTS: Abnormal gait, decreased activity tolerance, decreased balance, decreased endurance, decreased knowledge of condition, decreased knowledge of use of DME, decreased mobility, difficulty walking, decreased ROM, decreased strength, increased edema, postural dysfunction, obesity, and pain.   ACTIVITY LIMITATIONS: carrying, lifting, bending, sitting, standing, squatting, sleeping, stairs, transfers, bed mobility, and locomotion level  PARTICIPATION LIMITATIONS: meal prep, cleaning, driving, community activity, and church  PERSONAL FACTORS: Fitness, Past/current experiences, Time since onset of injury/illness/exacerbation, and 3+ comorbidities: see PMH  are also affecting patient's functional outcome.   REHAB POTENTIAL: Good  CLINICAL DECISION MAKING: Stable/uncomplicated  EVALUATION COMPLEXITY: Low   GOALS: Goals reviewed with patient? Yes  SHORT TERM GOALS: (target date for Short term goals 11/10/2022)   1.  Patient will demonstrate independent use of home exercise program to maintain progress from in clinic treatments. Baseline: see objective data Goal status:  MET 10/31/2022  2. PROM right knee ext -5* and flexion 90* Baseline: see objective data Goal status: MET  10/31/2022  3. Interim FOTO >32% Baseline: see objective data  Goal status:  MET 10/31/2022  LONG TERM GOALS: (target dates for all long term goals  12/21/2022 ) (for updated cert s/p manipulation)   1. Patient will demonstrate/report pain at worst less than or equal to 2/10 to facilitate minimal limitation in daily activity secondary to pain symptoms. Baseline: see objective data Goal status: Ongoing 11/06/2022   2. Patient will demonstrate independent use of home exercise program to facilitate ability to maintain/progress functional gains from skilled physical therapy services. Baseline: see objective data Goal status: Ongoing 11/06/2022   3. Patient will demonstrate FOTO outcome > or = 47 % to indicate reduced disability due to condition. Baseline: see objective data Goal status:  MET 10/31/2022   4.  Patient will demonstrate right knee MMT 4/5 throughout to faciltiate usual transfers, stairs, squatting at [facility] for daily life.  Baseline: see objective data Goal status: Ongoing 11/06/2022   5.  Patient right knee AROM ext standing -2* and flexion seated or supine 100* Baseline: see objective data Goal status: Ongoing 11/06/2022   6.  patient amb with LRAD >500' and negotiates ramps/curbs/stairs modified independent.  Baseline: see objective data Goal status: Ongoing 11/06/2022   PLAN:  PT FREQUENCY:  5x/week for 2 weeks, then drop to 2-3x/week for an additional 3 weeks   PT DURATION: 5 weeks   PLANNED INTERVENTIONS: Therapeutic exercises, Therapeutic activity, Neuro Muscular re-education, Balance training, Gait training, Patient/Family education, Joint mobilization, Stair training, DME instructions, Dry Needling, Electrical stimulation, Traction, Cryotherapy, vasopneumatic deviceMoist heat, Taping, Ultrasound, Ionotophoresis 4mg /ml Dexamethasone, and aquatic therapy, Manual therapy.  All included unless contraindicated  PLAN FOR NEXT SESSION: MD note with range measurements,  try Precor  bike as warm-up, exercises for mobiltiy gains.  Balance and strength improvements, SPC in clinic, vaso to end prn     Vladimir Faster, PT, DPT 11/27/2022, 2:43 PM

## 2022-11-27 NOTE — Progress Notes (Signed)
1. Neuritis of right foot    Meds ordered this encounter  Medications   gabapentin (NEURONTIN) 600 MG tablet    Sig: Take one tablet in the morning and two tablets before bedtime    Dispense:  270 tablet    Refill:  2   Okay per Dr. Lance Sell Prevette

## 2022-11-28 ENCOUNTER — Encounter: Payer: Self-pay | Admitting: Physical Therapy

## 2022-11-28 ENCOUNTER — Ambulatory Visit (INDEPENDENT_AMBULATORY_CARE_PROVIDER_SITE_OTHER): Payer: Medicare Other | Admitting: Physical Therapy

## 2022-11-28 DIAGNOSIS — M25561 Pain in right knee: Secondary | ICD-10-CM | POA: Diagnosis not present

## 2022-11-28 DIAGNOSIS — R600 Localized edema: Secondary | ICD-10-CM | POA: Diagnosis not present

## 2022-11-28 DIAGNOSIS — M6281 Muscle weakness (generalized): Secondary | ICD-10-CM

## 2022-11-28 DIAGNOSIS — R2681 Unsteadiness on feet: Secondary | ICD-10-CM

## 2022-11-28 DIAGNOSIS — M25661 Stiffness of right knee, not elsewhere classified: Secondary | ICD-10-CM

## 2022-11-28 DIAGNOSIS — R2689 Other abnormalities of gait and mobility: Secondary | ICD-10-CM

## 2022-11-28 NOTE — Therapy (Signed)
OUTPATIENT PHYSICAL THERAPY  TREATMENT & PROGRESS NOTE   Patient Name: Jessica [name] MRN: [id number] DOB:1951/01/28, 72 y.o., female Today's Date: 11/28/2022  Progress Note Reporting Period 11/06/2022 to 11/28/2022  See note below for Objective Data and Assessment of Progress/Goals.    END OF SESSION:  PT End of Session - 11/28/22 1258     Visit Number 23    Number of Visits 35    Date for PT Re-Evaluation 12/21/22    Authorization Type [employer]    Progress Note Due on Visit 33    PT Start Time 1258    PT Stop Time 1355    PT Time Calculation (min) 57 min    Activity Tolerance Patient tolerated treatment well;No increased pain    Behavior During Therapy [facility] for tasks assessed/performed                              Past Medical History:  Diagnosis Date   Anxiety    Arthritis    bilateral knees   Asthma    childhood, inhaler for wheezing PRN   Autonomic neuropathy    diabetic   Cataract    Celiac disease    Constipation    Depression    Diabetes mellitus    type II, Hemoglobin A1C 9.9 10/05/2011   Diabetic neuropathy (HCC)    Diverticulosis    Fatty liver    Gastroparesis    GERD (gastroesophageal reflux disease)    History of claustrophobia    with MRI tests   Hyperlipidemia    Hypertension    IBS (irritable bowel syndrome)    Kidney stones    Personal history of colonic polyps 03/2010   hyperplastic   PONV (postoperative nausea and vomiting)    Shingles 2021   Past Surgical History:  Procedure Laterality Date   APPENDECTOMY     CATARACT EXTRACTION Right 04/29/2018   CATARACT EXTRACTION Left 03/2018   COLONOSCOPY     DILATATION & CURRETTAGE/HYSTEROSCOPY WITH RESECTOCOPE N/A 03/24/2013   Procedure: DILATATION & CURETTAGE, HYSTEROSCOPY WITH RESECTION;  Surgeon: Levi Aland, MD;  Location: [facility];  Service: Gynecology;  Laterality: N/A;   HYSTEROSCOPY WITH D & C N/A 11/08/2015   Procedure: DILATATION AND CURETTAGE  /HYSTEROSCOPY;  Surgeon: Levi Aland, MD;  Location: [facility];  Service: Gynecology;  Laterality: N/A;   left breast cyst removal  2000   LEFT HEART CATH AND CORONARY ANGIOGRAPHY N/A 10/31/2018   Procedure: LEFT HEART CATH AND CORONARY ANGIOGRAPHY;  Surgeon: Corky Crafts, MD;  Location: [facility] INVASIVE CV LAB;  Service: Cardiovascular;  Laterality: N/A;   TOTAL KNEE ARTHROPLASTY Right 09/12/2022   Procedure: RIGHT TOTAL KNEE ARTHROPLASTY;  Surgeon: Kathryne Hitch, MD;  Location: [facility];  Service: Orthopedics;  Laterality: Right;   TUBAL LIGATION     Patient Active Problem List   Diagnosis Date Noted   Status post total right knee replacement 09/12/2022   Degenerative arthritis of left knee 08/07/2022   Chronic rhinitis 04/18/2022   Gastroesophageal reflux disease 04/18/2022   Vitamin D deficiency 04/13/2022   Morbid obesity (HCC) 04/13/2022   Peripheral sensory neuropathy due to type 2 diabetes mellitus (HCC) 04/05/2020   Bilateral lower extremity edema 01/20/2020   Family history of malignant neoplasm of endometrium 10/08/2018   Celiac disease 02/14/2018   Osteopenia 02/14/2018   Mild intermittent asthma 02/14/2018   Diabetic neuropathy associated with type 2  diabetes mellitus (HCC) 02/14/2018   Class 2 obesity due to excess calories with body mass index (BMI) of 37.0 to 37.9 in adult 02/14/2018   Fibroma of tongue 06/06/2016   Renal calculi 01/13/2016   Degenerative cervical disc 07/27/2015   OSA (obstructive sleep apnea) 02/28/2011   Constipation, slow transit 09/23/2010   Type 2 diabetes mellitus with complication, with long-term current use of insulin (HCC) 09/23/2010   FH: colon cancer 09/23/2010   Personal history of colonic polyps 09/23/2010   Combined hyperlipidemia associated with type 2 diabetes mellitus (HCC) 01/29/2009   Diabetic gastroparesis (HCC) 02/27/2007   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) 10/22/2006    Essential hypertension 10/22/2006    PCP: Durward Mallard L. Mardelle Matte, MD   REFERRING PROVIDER: Vanita Panda. Magnus Ivan, MD  REFERRING DIAG: [phone] (ICD-10-CM) - Status post total right knee replacement   THERAPY DIAG:  Stiffness of right knee, not elsewhere classified  Acute pain of right knee  Muscle weakness (generalized)  Localized edema  Other abnormalities of gait and mobility  Unsteadiness on feet  Rationale for Evaluation and Treatment: Rehabilitation  ONSET DATE: 09/12/2022  SUBJECTIVE:   SUBJECTIVE STATEMENT: She tried bike at house but could not get the seat to move to position to bend & straighten knee.   PERTINENT HISTORY: Right TKA 09/12/2022, DM2, asthma, OA, peripheral neuropathy, osteopenia, shingles  PAIN:  NPRS scale: 2/10 on way to PT and 0/10 prior to ride.  Pain location: Rt  Pain description: throbbing Aggravating factors: nighttime Relieving factors: Tylenol, ice  PRECAUTIONS: Fall  WEIGHT BEARING RESTRICTIONS: No  FALLS:  Has patient fallen in last 6 months? No  LIVING ENVIRONMENT: Lives with: lives with their spouse Lives in: [place] with master handicap accessible bath & bedroom ramped entrance. Does not go upstairs.  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   PATIENT SURVEYS:  10/31/2022: FOTO visit 10 52%  Eval: FOTO intake:  21%  predicted:  47%  COGNITION:10/11/2022 Overall cognitive status: WFL    SENSATION: 10/11/2022 WFL  EDEMA:  10/11/2022 Circumferential:   LLE: above knee 51.5cm,  around knee 50.2 cm, below knee 42.5 cm RLE: above knee 55.5 cm,  around knee 53.8 cm, below knee 46.2 cm  POSTURE: 10/11/2022  rounded shoulders, forward head, flexed trunk , and weight shift left  PALPATION: 10/11/2022 Right knee tenderness along joint line & incision.   LOWER EXTREMITY ROM:   ROM Right eval Right 10/13/22 Right 10/16/22 Right 10/25/22 Right 10/31/22 Right 11/08/2022 Right 11/16/22 Right 11/17/22 PROM Right 11/20/22 Right 11/23/22 Right 11/28/22  Knee flexion Seated A: 64* P: 74* Active 75 AA 84* Seated P: 87* A: 77* Seated P: 90* A:83* Supine AROM heel slide: 80    Supine and sitting 90*  Supine 87 deg  Sitting 99 deg s but not pain Seated P: 100* AA: 95* Supine A: 96 P: 107 Seated A: 99* P: 111*  Knee extension Seated A: LAQ -22* Seated with LE extended P: -11* Active -7 Supine A: -5*  Supine P: -1*  A: quad set -3* SAQ -6* Seated AROM LAQ: -14  Supine quad set AROM -6 Seated with heel 14*, with overpressure 12*  Supine P: -1* A: quad set -4*  Seated  LAQ -9* Standing  Quad set -3* Supine P: -1*   (Blank rows = not tested)  LOWER EXTREMITY MMT:  MMT Right 10/11/2022 Right 11/08/2022 Right 11/16/22  Hip flexion     Hip extension     Hip abduction     Hip adduction     Hip internal rotation     Hip external rotation     Knee flexion 3-/5 5/5 in available range   Knee extension 3-/5 4-+/5 in available range 5 in available ROM  Ankle dorsiflexion     Ankle plantarflexion     Ankle inversion     Ankle eversion      (Blank rows = not tested)  FUNCTIONAL TESTS:  11/08/2022:  24 seconds with FWW  10/11/2022 18 inch chair transfer: requires BUE support to arise from 22" w/c to RW for stabilization TUG with RW: 41.75 sec  GAIT: 11/27/2022: gait velocity with cane 2.61 ft/sec and without device 2.11 ft/sec  11/08/2022:  Ambulation into clinic c FWW.   10/11/2022 Distance walked: 20' Assistive device  utilized: Environmental consultant - 2 wheeled Level of assistance: SBA Comments: Antalgic gait pattern with decreased stance duration right lower extremity, right knee flexed in stance and minimal increase flexion for swing phase and flexed trunk.                     TODAY'S TREATMENT                                               DATE: 11/28/2022 Therapeutic Exercise: Precor bike with BLEs seat 6 rocking for flexion stretch for 8 mins; 5 times full revolution backwards with PT manual assist. Then 1 min rocking and pt able to get full revolution backwards 5 reps without assistance Knee flexion stretch standing with Rt foot in 18" chair 10 sec hold 3 reps 4 sets moving LLE stance closer 1" for each set Leg press in max bending tolerated - double leg 100 lbs x 15 reps; single leg Rt 50 lbs 2 x 15.  Rest in bending for stretch Hamstring curl blue theraband 10 reps 2 sets LAQ 5# 2x10 seated Standing with LUE support stepping over 6" hurdle alternating LEs 15 reps   Manual Therapy: Seated knee flexion/ext PROM with overpressure and contract relax Mob belt for ext standing on incline board  Vaso right knee elevation 34* medium compression 10 min  TREATMENT                                               DATE: 11/27/2022 Therapeutic Exercise: Sci[employer] bike with BLEs & BUEs level 2 seat 9 for 8 mins  Leg press in max bending tolerated - double leg 87 lbs x 15 , single leg Rt 43 lbs 2 x 15.  Rest in bending for stretch Pt has a recumbent bike at home. PT demo & verbal cues adjusting seat for her leg length. She return demo with rocking for flexion stretch with Precor seat 6. Pt verbalized understanding. Hamstring stretch RLE  long sit with strap 30 sec 2 reps LAQ 5# 2x10 seated Seated quad set with heel prop for inc ROM x25 heavy tactile/verbal cues, increased time to mitigate hip compensations Red band TKE standing with SLS 2 sets 10 reps with cane support  Attempted quad stretch supine hooklying but had back pain  with position.  Therapeutic Activities: Pt amb 100' with cane safely Pt amb 100' without device safely Pt able to carry 10# basket safely. Pt reports that she was afraid to carry her laundry but this showed her that she could.   Manual Therapy: Seated knee flexion/ext PROM with overpressure and contract relax  Vaso right knee elevation 34* medium compression 10 min  TREATMENT                                               DATE: 11/24/22 Therapeutic Exercise: Nu step LE/UE seat at 9, 5 min, seated 8 for 3 additional minutes  LAQ 4# 2x15 seated Supine quad set with heel prop for inc ROM x25 heavy tactile/verbal cues, increased time to mitigate hip compensations Red band TKE standing using RW x10 Red band TKE standing at wall for external cue x10  Manual Therapy: Seated knee flexion/ext PROM to pt tolerance, able to achieve 105deg passively    HOME EXERCISE PROGRAM: Access Code: [email or web address] URL: [email or web address] Date: 11/06/2022 Prepared by: Vladimir Faster  Exercises - Ankle Alphabet in Elevation  - 2-4 x daily - 7 x weekly - 1 sets - 1 reps - supine quad set with towel roll under ankle  - 2-4 x daily - 7 x weekly - 2 sets - 10 reps - 5 seconds hold - Supine Heel Slide with Strap  - 2-3 x daily - 7 x weekly - 2-3 sets - 10 reps - 5 seconds hold - Seated Knee Flexion Extension AROM   - 2-4 x daily - 7 x weekly - 2-3 sets - 10 reps - 5 seconds hold - Supine Knee Extension Strengthening  - 2-4 x daily - 7 x weekly - 2-3 sets - 10 reps - 5 seconds hold - Seated Long Arc Quad  - 2-4 x daily - 7 x weekly - 2-3 sets - 10 reps - 5 seconds hold - standing calf stretch with forefoot on small step or brick  - 1 x daily - 7 x weekly - 1 sets - 3 reps - 30 seconds hold - Seated Table Hamstring Stretch  - 1 x daily - 7 x weekly - 1 sets - 3 reps - 30 seconds hold - Supine Quadriceps Stretch with Strap on Table  - 1 x daily - 7 x weekly - 1 sets - 3 reps - 30 seconds hold -  Standing Tandem Balance with Counter Support  - 1 x daily - 7 x weekly - 1 sets - 2 reps - 30 seconds hold - Heel raises near counter  - 1 x daily - 7 x weekly - 2 sets - 10 reps - 5 seconds hold  ASSESSMENT: CLINICAL IMPRESSION: Patient has improved both passive and active range.  She appears safe to use cane for community basic distances and no device in her home.  She continues to need skilled PT to improve function and mobility.    OBJECTIVE IMPAIRMENTS: Abnormal gait, decreased activity tolerance, decreased balance, decreased endurance, decreased knowledge of condition, decreased knowledge  of use of DME, decreased mobility, difficulty walking, decreased ROM, decreased strength, increased edema, postural dysfunction, obesity, and pain.   ACTIVITY LIMITATIONS: carrying, lifting, bending, sitting, standing, squatting, sleeping, stairs, transfers, bed mobility, and locomotion level  PARTICIPATION LIMITATIONS: meal prep, cleaning, driving, community activity, and church  PERSONAL FACTORS: Fitness, Past/current experiences, Time since onset of injury/illness/exacerbation, and 3+ comorbidities: see PMH  are also affecting patient's functional outcome.   REHAB POTENTIAL: Good  CLINICAL DECISION MAKING: Stable/uncomplicated  EVALUATION COMPLEXITY: Low   GOALS: Goals reviewed with patient? Yes  SHORT TERM GOALS: (target date for Short term goals 11/10/2022)   1.  Patient will demonstrate independent use of home exercise program to maintain progress from in clinic treatments. Baseline: see objective data Goal status:  MET 10/31/2022  2. PROM right knee ext -5* and flexion 90* Baseline: see objective data Goal status: MET 10/31/2022  3. Interim FOTO >32% Baseline: see objective data  Goal status:  MET 10/31/2022  LONG TERM GOALS: (target dates for all long term goals  12/21/2022 ) (for updated cert s/p manipulation)   1. Patient will demonstrate/report pain at worst less than or equal to  2/10 to facilitate minimal limitation in daily activity secondary to pain symptoms. Baseline: see objective data Goal status: Ongoing 11/06/2022   2. Patient will demonstrate independent use of home exercise program to facilitate ability to maintain/progress functional gains from skilled physical therapy services. Baseline: see objective data Goal status: Ongoing 11/06/2022   3. Patient will demonstrate FOTO outcome > or = 47 % to indicate reduced disability due to condition. Baseline: see objective data Goal status:  MET 10/31/2022   4.  Patient will demonstrate right knee MMT 4/5 throughout to faciltiate usual transfers, stairs, squatting at [facility] for daily life.  Baseline: see objective data Goal status: Ongoing 11/06/2022   5.  Patient right knee AROM ext standing -2* and flexion seated or supine 100* Baseline: see objective data Goal status: Ongoing 11/06/2022   6.  patient amb with LRAD >500' and negotiates ramps/curbs/stairs modified independent.  Baseline: see objective data Goal status: Ongoing 11/06/2022   PLAN:  PT FREQUENCY:  5x/week for 2 weeks, then drop to 2-3x/week for an additional 3 weeks   PT DURATION: 5 weeks   PLANNED INTERVENTIONS: Therapeutic exercises, Therapeutic activity, Neuro Muscular re-education, Balance training, Gait training, Patient/Family education, Joint mobilization, Stair training, DME instructions, Dry Needling, Electrical stimulation, Traction, Cryotherapy, vasopneumatic deviceMoist heat, Taping, Ultrasound, Ionotophoresis 4mg /ml Dexamethasone, and aquatic therapy, Manual therapy.  All included unless contraindicated  PLAN FOR NEXT SESSION: check MD note, Precor bike as warm-up, exercises for mobiltiy gains.  Balance and strength improvements, SPC in clinic, vaso to end prn     Vladimir Faster, PT, DPT 11/28/2022, 2:01 PM

## 2022-11-29 ENCOUNTER — Ambulatory Visit (INDEPENDENT_AMBULATORY_CARE_PROVIDER_SITE_OTHER): Payer: Medicare Other

## 2022-11-29 ENCOUNTER — Encounter: Payer: Self-pay | Admitting: Orthopaedic Surgery

## 2022-11-29 ENCOUNTER — Ambulatory Visit (INDEPENDENT_AMBULATORY_CARE_PROVIDER_SITE_OTHER): Payer: Medicare Other | Admitting: Orthopaedic Surgery

## 2022-11-29 ENCOUNTER — Encounter: Payer: Self-pay | Admitting: Physical Therapy

## 2022-11-29 ENCOUNTER — Ambulatory Visit (INDEPENDENT_AMBULATORY_CARE_PROVIDER_SITE_OTHER): Payer: Medicare Other | Admitting: Physical Therapy

## 2022-11-29 DIAGNOSIS — R2681 Unsteadiness on feet: Secondary | ICD-10-CM

## 2022-11-29 DIAGNOSIS — M25661 Stiffness of right knee, not elsewhere classified: Secondary | ICD-10-CM | POA: Diagnosis not present

## 2022-11-29 DIAGNOSIS — Z96651 Presence of right artificial knee joint: Secondary | ICD-10-CM

## 2022-11-29 DIAGNOSIS — M25561 Pain in right knee: Secondary | ICD-10-CM | POA: Diagnosis not present

## 2022-11-29 DIAGNOSIS — R2689 Other abnormalities of gait and mobility: Secondary | ICD-10-CM

## 2022-11-29 DIAGNOSIS — R600 Localized edema: Secondary | ICD-10-CM | POA: Diagnosis not present

## 2022-11-29 DIAGNOSIS — M6281 Muscle weakness (generalized): Secondary | ICD-10-CM

## 2022-11-29 NOTE — Therapy (Signed)
OUTPATIENT PHYSICAL THERAPY  TREATMENT  Patient Name: Jessica [name] MRN: [id number] DOB:04/11/1951, 72 y.o., female Today's Date: 11/29/2022   END OF SESSION:  PT End of Session - 11/29/22 1348     Visit Number 24    Number of Visits 35    Date for PT Re-Evaluation 12/21/22    Authorization Type [employer] [facility]    Progress Note Due on Visit 33    PT Start Time 1345    PT Stop Time 1440    PT Time Calculation (min) 55 min    Activity Tolerance Patient tolerated treatment well;No increased pain    Behavior During Therapy [facility] for tasks assessed/performed                               Past Medical History:  Diagnosis Date   Anxiety    Arthritis    bilateral knees   Asthma    childhood, inhaler for wheezing PRN   Autonomic neuropathy    diabetic   Cataract    Celiac disease    Constipation    Depression    Diabetes mellitus    type II, Hemoglobin A1C 9.9 10/05/2011   Diabetic neuropathy (HCC)    Diverticulosis    Fatty liver    Gastroparesis    GERD (gastroesophageal reflux disease)    History of claustrophobia    with MRI tests   Hyperlipidemia    Hypertension    IBS (irritable bowel syndrome)    Kidney stones    Personal history of colonic polyps 03/2010   hyperplastic   PONV (postoperative nausea and vomiting)    Shingles 2021   Past Surgical History:  Procedure Laterality Date   APPENDECTOMY     CATARACT EXTRACTION Right 04/29/2018   CATARACT EXTRACTION Left 03/2018   COLONOSCOPY     DILATATION & CURRETTAGE/HYSTEROSCOPY WITH RESECTOCOPE N/A 03/24/2013   Procedure: DILATATION & CURETTAGE, HYSTEROSCOPY WITH RESECTION;  Surgeon: Levi Aland, MD;  Location: [facility];  Service: Gynecology;  Laterality: N/A;   HYSTEROSCOPY WITH D & C N/A 11/08/2015   Procedure: DILATATION AND CURETTAGE /HYSTEROSCOPY;  Surgeon: Levi Aland, MD;  Location: [facility];  Service: Gynecology;  Laterality: N/A;   left breast cyst removal  2000   LEFT HEART  CATH AND CORONARY ANGIOGRAPHY N/A 10/31/2018   Procedure: LEFT HEART CATH AND CORONARY ANGIOGRAPHY;  Surgeon: Corky Crafts, MD;  Location: [facility] INVASIVE CV LAB;  Service: Cardiovascular;  Laterality: N/A;   TOTAL KNEE ARTHROPLASTY Right 09/12/2022   Procedure: RIGHT TOTAL KNEE ARTHROPLASTY;  Surgeon: Kathryne Hitch, MD;  Location: [facility] OR;  Service: Orthopedics;  Laterality: Right;   TUBAL LIGATION     Patient Active Problem List   Diagnosis Date Noted   Status post total right knee replacement 09/12/2022   Degenerative arthritis of left knee 08/07/2022   Chronic rhinitis 04/18/2022   Gastroesophageal reflux disease 04/18/2022   Vitamin D deficiency 04/13/2022   Morbid obesity (HCC) 04/13/2022   Peripheral sensory neuropathy due to type 2 diabetes mellitus (HCC) 04/05/2020   Bilateral lower extremity edema 01/20/2020   Family history of malignant neoplasm of endometrium 10/08/2018   Celiac disease 02/14/2018   Osteopenia 02/14/2018   Mild intermittent asthma 02/14/2018   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) 02/14/2018   Class 2 obesity due to excess calories with body mass index (BMI) of 37.0 to 37.9 in  adult 02/14/2018   Fibroma of tongue 06/06/2016   Renal calculi 01/13/2016   Degenerative cervical disc 07/27/2015   OSA (obstructive sleep apnea) 02/28/2011   Constipation, slow transit 09/23/2010   Type 2 diabetes mellitus with complication, with long-term current use of insulin (HCC) 09/23/2010   FH: colon cancer 09/23/2010   Personal history of colonic polyps 09/23/2010   Combined hyperlipidemia associated with type 2 diabetes mellitus (HCC) 01/29/2009   Diabetic gastroparesis (HCC) 02/27/2007   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 Mardelle Matte, MD   REFERRING PROVIDER: Vanita Panda. Magnus Ivan, MD  REFERRING DIAG: [phone] (ICD-10-CM) -  Status post total right knee replacement   THERAPY DIAG:  Stiffness of right knee, not elsewhere classified  Acute pain of right knee  Muscle weakness (generalized)  Localized edema  Other abnormalities of gait and mobility  Unsteadiness on feet  Rationale for Evaluation and Treatment: Rehabilitation  ONSET DATE: 09/12/2022  SUBJECTIVE:   SUBJECTIVE STATEMENT: She saw Dr. Magnus Ivan before PT today. He was pleased with her progress.    PERTINENT HISTORY: Right TKA 09/12/2022, DM2, asthma, OA, peripheral neuropathy, osteopenia, shingles  PAIN:  NPRS scale: 2/10 on way to PT and 0/10 prior to ride.  Pain location: Rt  Pain description: throbbing Aggravating factors: nighttime Relieving factors: Tylenol, ice  PRECAUTIONS: Fall  WEIGHT BEARING RESTRICTIONS: No  FALLS:  Has patient fallen in last 6 months? No  LIVING ENVIRONMENT: Lives with: lives with their spouse Lives in: [address] with master handicap accessible bath & bedroom ramped entrance. Does not go upstairs.  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   PATIENT SURVEYS:  10/31/2022: FOTO visit 10 52%  Eval: FOTO intake:  21%  predicted:  47%  COGNITION:10/11/2022 Overall cognitive status: WFL    SENSATION: 10/11/2022 WFL  EDEMA:  10/11/2022 Circumferential:  LLE: above knee 51.5cm,  around knee 50.2 cm, below knee 42.5 cm RLE: above knee 55.5 cm,  around knee 53.8 cm, below knee 46.2 cm  POSTURE: 10/11/2022  rounded shoulders, forward  head, flexed trunk , and weight shift left  PALPATION: 10/11/2022 Right knee tenderness along joint line & incision.   LOWER EXTREMITY ROM:   ROM Right eval Right 10/13/22 Right 10/16/22 Right 10/25/22 Right 10/31/22 Right 11/08/2022 Right 11/16/22 Right 11/17/22 PROM Right 11/20/22 Right 11/23/22 Right 11/28/22  Knee flexion Seated A: 64* P: 74* Active 75 AA 84* Seated P: 87* A: 77* Seated P: 90* A:83* Supine AROM heel slide: 80    Supine and sitting 90*  Supine 87 deg  Sitting 99 deg s but not pain Seated P: 100* AA: 95* Supine A: 96 P: 107 Seated A: 99* P: 111*  Knee extension Seated A: LAQ -22* Seated with LE extended P: -11* Active -7 Supine A: -5*  Supine P: -1* A: quad set -3* SAQ -6* Seated AROM LAQ: -14  Supine quad set AROM -6 Seated with heel 14*, with overpressure 12*  Supine P: -1* A: quad  set -4*  Seated  LAQ -9* Standing  Quad set -3* Supine P: -1*   (Blank rows = not tested)  LOWER EXTREMITY MMT:  MMT Right 10/11/2022 Right 11/08/2022 Right 11/16/22  Hip flexion     Hip extension     Hip abduction     Hip adduction     Hip internal rotation     Hip external rotation     Knee flexion 3-/5 5/5 in available range   Knee extension 3-/5 4-+/5 in available range 5 in available ROM  Ankle dorsiflexion     Ankle plantarflexion     Ankle inversion     Ankle eversion      (Blank rows = not tested)  FUNCTIONAL TESTS:  11/08/2022:  24 seconds with FWW  10/11/2022 18 inch chair transfer: requires BUE support to arise from 22" w/c to RW for stabilization TUG with RW: 41.75 sec  GAIT: 11/27/2022: gait velocity with cane 2.61 ft/sec and without device 2.11 ft/sec  11/08/2022:  Ambulation into clinic c FWW.   10/11/2022 Distance walked: 20' Assistive device utilized: Environmental consultant - 2 wheeled Level of assistance: SBA Comments: Antalgic gait pattern with decreased stance duration right lower extremity, right knee flexed in stance and minimal increase  flexion for swing phase and flexed trunk.                     TODAY'S TREATMENT                                               DATE: 11/29/2022: Therapeutic Exercise: Precor bike with BLEs seat 6 rocking for flexion stretch for 8 mins; assist. PT assisted backwards full revolution 3 reps, then pt performed 10 reps backward, then forward 1 min Leg press in max bending tolerated - double leg 100 lbs x 15 reps; single leg Rt 50 lbs 2 x 15.  Rest in bending for stretch Hamstring curl blue theraband 10 reps 2 sets LAQ 5# 2x10 seated  Neuromuscular Re-education: Side stepping on / off foam beam 10 reps to right & 10 reps to left with intermittent touch //bars for balance.  Stepping RLE onto foam beam SLS tapping LLE to cone: 10 reps RUE //bar, 10 reps LUE //bar, 10 reps with cane LUE  Manual Therapy: Seated knee flexion/ext PROM with overpressure and contract relax Mob belt for ext standing on incline board  Vaso right knee elevation 34* medium compression 10 min    TREATMENT                                               DATE: 11/28/2022 Therapeutic Exercise: Precor bike with BLEs seat 6 rocking for flexion stretch for 8 mins; 5 times full revolution backwards with PT manual assist. Then 1 min rocking and pt able to get full revolution backwards 5 reps without assistance Knee flexion stretch standing with Rt foot in 18" chair 10 sec hold 3 reps 4 sets moving LLE stance closer 1" for each set Leg press in max bending tolerated - double leg 100 lbs x 15 reps; single leg Rt 50 lbs 2 x 15.  Rest in bending for stretch Hamstring curl blue theraband 10 reps 2 sets LAQ 5#  2x10 seated Standing with LUE support stepping over 6" hurdle alternating LEs 15 reps   Manual Therapy: Seated knee flexion/ext PROM with overpressure and contract relax Mob belt for ext standing on incline board  Vaso right knee elevation 34* medium compression 10 min    TREATMENT                                                DATE: 11/27/2022 Therapeutic Exercise: SciFit bike with BLEs & BUEs level 2 seat 9 for 8 mins  Leg press in max bending tolerated - double leg 87 lbs x 15 , single leg Rt 43 lbs 2 x 15.  Rest in bending for stretch Pt has a recumbent bike at home. PT demo & verbal cues adjusting seat for her leg length. She return demo with rocking for flexion stretch with Precor seat 6. Pt verbalized understanding. Hamstring stretch RLE long sit with strap 30 sec 2 reps LAQ 5# 2x10 seated Seated quad set with heel prop for inc ROM x25 heavy tactile/verbal cues, increased time to mitigate hip compensations Red band TKE standing with SLS 2 sets 10 reps with cane support  Attempted quad stretch supine hooklying but had back pain with position.  Therapeutic Activities: Pt amb 100' with cane safely Pt amb 100' without device safely Pt able to carry 10# basket safely. Pt reports that she was afraid to carry her laundry but this showed her that she could.   Manual Therapy: Seated knee flexion/ext PROM with overpressure and contract relax  Vaso right knee elevation 34* medium compression 10 min    HOME EXERCISE PROGRAM: Access Code: [email or web address] URL: [email or web address] Date: 11/06/2022 Prepared by: Vladimir Faster  Exercises - Ankle Alphabet in Elevation  - 2-4 x daily - 7 x weekly - 1 sets - 1 reps - supine quad set with towel roll under ankle  - 2-4 x daily - 7 x weekly - 2 sets - 10 reps - 5 seconds hold - Supine Heel Slide with Strap  - 2-3 x daily - 7 x weekly - 2-3 sets - 10 reps - 5 seconds hold - Seated Knee Flexion Extension AROM   - 2-4 x daily - 7 x weekly - 2-3 sets - 10 reps - 5 seconds hold - Supine Knee Extension Strengthening  - 2-4 x daily - 7 x weekly - 2-3 sets - 10 reps - 5 seconds hold - Seated Long Arc Quad  - 2-4 x daily - 7 x weekly - 2-3 sets - 10 reps - 5 seconds hold - standing calf stretch with forefoot on small step or brick  - 1 x daily - 7 x weekly - 1 sets - 3  reps - 30 seconds hold - Seated Table Hamstring Stretch  - 1 x daily - 7 x weekly - 1 sets - 3 reps - 30 seconds hold - Supine Quadriceps Stretch with Strap on Table  - 1 x daily - 7 x weekly - 1 sets - 3 reps - 30 seconds hold - Standing Tandem Balance with Counter Support  - 1 x daily - 7 x weekly - 1 sets - 2 reps - 30 seconds hold - Heel raises near counter  - 1 x daily - 7 x weekly - 2 sets - 10 reps - 5 seconds hold  ASSESSMENT: CLINICAL IMPRESSION: PT added  balance to clinic activities which patient found challenging but improved with repetition.  Patient had improved confidence to use cane to enter & exit gym.  She continues to need skilled PT to improve function and mobility.    OBJECTIVE IMPAIRMENTS: Abnormal gait, decreased activity tolerance, decreased balance, decreased endurance, decreased knowledge of condition, decreased knowledge of use of DME, decreased mobility, difficulty walking, decreased ROM, decreased strength, increased edema, postural dysfunction, obesity, and pain.   ACTIVITY LIMITATIONS: carrying, lifting, bending, sitting, standing, squatting, sleeping, stairs, transfers, bed mobility, and locomotion level  PARTICIPATION LIMITATIONS: meal prep, cleaning, driving, community activity, and church  PERSONAL FACTORS: Fitness, Past/current experiences, Time since onset of injury/illness/exacerbation, and 3+ comorbidities: see PMH  are also affecting patient's functional outcome.   REHAB POTENTIAL: Good  CLINICAL DECISION MAKING: Stable/uncomplicated  EVALUATION COMPLEXITY: Low   GOALS: Goals reviewed with patient? Yes  SHORT TERM GOALS: (target date for Short term goals 11/10/2022)   1.  Patient will demonstrate independent use of home exercise program to maintain progress from in clinic treatments. Baseline: see objective data Goal status:  MET 10/31/2022  2. PROM right knee ext -5* and flexion 90* Baseline: see objective data Goal status: MET 10/31/2022  3.  Interim FOTO >32% Baseline: see objective data  Goal status:  MET 10/31/2022  LONG TERM GOALS: (target dates for all long term goals  12/21/2022 ) (for updated cert s/p manipulation)   1. Patient will demonstrate/report pain at worst less than or equal to 2/10 to facilitate minimal limitation in daily activity secondary to pain symptoms. Baseline: see objective data Goal status: Ongoing 11/29/2022   2. Patient will demonstrate independent use of home exercise program to facilitate ability to maintain/progress functional gains from skilled physical therapy services. Baseline: see objective data Goal status: Ongoing 11/29/2022   3. Patient will demonstrate FOTO outcome > or = 47 % to indicate reduced disability due to condition. Baseline: see objective data Goal status:  MET 11/29/2022   4.  Patient will demonstrate right knee MMT 4/5 throughout to faciltiate usual transfers, stairs, squatting at [facility] for daily life.  Baseline: see objective data Goal status: Ongoing 11/29/2022   5.  Patient right knee AROM ext standing -2* and flexion seated or supine 100* Baseline: see objective data Goal status: Ongoing 11/29/2022   6.  patient amb with LRAD >500' and negotiates ramps/curbs/stairs modified independent.  Baseline: see objective data Goal status: Ongoing  11/29/2022   PLAN:  PT FREQUENCY:  5x/week for 2 weeks, then drop to 2-3x/week for an additional 3 weeks   PT DURATION: 5 weeks   PLANNED INTERVENTIONS: Therapeutic exercises, Therapeutic activity, Neuro Muscular re-education, Balance training, Gait training, Patient/Family education, Joint mobilization, Stair training, DME instructions, Dry Needling, Electrical stimulation, Traction, Cryotherapy, vasopneumatic deviceMoist heat, Taping, Ultrasound, Ionotophoresis 4mg /ml Dexamethasone, and aquatic therapy, Manual therapy.  All included unless contraindicated  PLAN FOR NEXT SESSION: pt to go to Aquatic PT next week to learn exercises in  pool,  Precor bike as warm-up, exercises for mobiltiy gains.  Balance and strength improvements, SPC in clinic, vaso to end prn     Vladimir Faster, PT, DPT 11/29/2022, 3:59 PM

## 2022-11-29 NOTE — Progress Notes (Signed)
The patient is now past 10 weeks status post a right total knee arthroplasty.  She is ambulate with a cane.  She drove today.  She is only taken Tylenol for pain.  She has been going through extensive physical therapy still.  A few weeks ago we took her back to the operating room for right knee manipulation under anesthesia.  I was able to flex her knee to at least 115 degrees or more.  She says in therapy she is pleased that they are able to flex her past 90 degrees on occasion.  On my exam today she lacks full extension by 3 to 5 degrees and I can flex her to maybe 90 degrees.  I think she can go beyond this and she is very satisfied on what is going on with her right now in terms of her mobility.  She denies any significant pain.  She also understands the swelling will take some time to subside  2 views of the right knee show well-seated total knee arthroplasty with no complicating features.  At this point she will continue therapy as well as a home exercise program.  From our standpoint, we will see her back in 3 months to see how she is doing overall but no x-rays are needed.

## 2022-11-30 ENCOUNTER — Encounter: Payer: Self-pay | Admitting: Rehabilitative and Restorative Service Providers"

## 2022-11-30 ENCOUNTER — Ambulatory Visit (INDEPENDENT_AMBULATORY_CARE_PROVIDER_SITE_OTHER): Payer: Medicare Other | Admitting: Rehabilitative and Restorative Service Providers"

## 2022-11-30 ENCOUNTER — Telehealth (INDEPENDENT_AMBULATORY_CARE_PROVIDER_SITE_OTHER): Payer: Medicare Other | Admitting: Family Medicine

## 2022-11-30 ENCOUNTER — Encounter (INDEPENDENT_AMBULATORY_CARE_PROVIDER_SITE_OTHER): Payer: Self-pay | Admitting: Family Medicine

## 2022-11-30 DIAGNOSIS — E1142 Type 2 diabetes mellitus with diabetic polyneuropathy: Secondary | ICD-10-CM | POA: Diagnosis not present

## 2022-11-30 DIAGNOSIS — M6281 Muscle weakness (generalized): Secondary | ICD-10-CM

## 2022-11-30 DIAGNOSIS — R2681 Unsteadiness on feet: Secondary | ICD-10-CM

## 2022-11-30 DIAGNOSIS — M25561 Pain in right knee: Secondary | ICD-10-CM | POA: Diagnosis not present

## 2022-11-30 DIAGNOSIS — R600 Localized edema: Secondary | ICD-10-CM | POA: Diagnosis not present

## 2022-11-30 DIAGNOSIS — M25661 Stiffness of right knee, not elsewhere classified: Secondary | ICD-10-CM | POA: Diagnosis not present

## 2022-11-30 DIAGNOSIS — R2689 Other abnormalities of gait and mobility: Secondary | ICD-10-CM

## 2022-11-30 DIAGNOSIS — Z6834 Body mass index (BMI) 34.0-34.9, adult: Secondary | ICD-10-CM | POA: Diagnosis not present

## 2022-11-30 DIAGNOSIS — R110 Nausea: Secondary | ICD-10-CM

## 2022-11-30 DIAGNOSIS — Z794 Long term (current) use of insulin: Secondary | ICD-10-CM

## 2022-11-30 MED ORDER — ONDANSETRON 4 MG PO TBDP
4.0000 mg | ORAL_TABLET | Freq: Three times a day (TID) | ORAL | 0 refills | Status: DC | PRN
Start: 2022-11-30 — End: 2023-01-03

## 2022-11-30 MED ORDER — TRULICITY 0.75 MG/0.5ML ~~LOC~~ SOAJ
SUBCUTANEOUS | 0 refills | Status: DC
Start: 2022-11-30 — End: 2023-01-03

## 2022-11-30 NOTE — Therapy (Signed)
OUTPATIENT PHYSICAL THERAPY  TREATMENT  Patient Name: Jessica [name] MRN: [id number] DOB:09-09-1950, 72 y.o., female Today's Date: 11/30/2022   END OF SESSION:  PT End of Session - 11/30/22 1258     Visit Number 25    Number of Visits 35    Date for PT Re-Evaluation 12/21/22    Authorization Type [employer] [facility]    Progress Note Due on Visit 33    PT Start Time 1258    PT Stop Time 1348    PT Time Calculation (min) 50 min    Activity Tolerance Patient tolerated treatment well    Behavior During Therapy WFL for tasks assessed/performed                                Past Medical History:  Diagnosis Date   Anxiety    Arthritis    bilateral knees   Asthma    childhood, inhaler for wheezing PRN   Autonomic neuropathy    diabetic   Cataract    Celiac disease    Constipation    Depression    Diabetes mellitus    type II, Hemoglobin A1C 9.9 10/05/2011   Diabetic neuropathy (HCC)    Diverticulosis    Fatty liver    Gastroparesis    GERD (gastroesophageal reflux disease)    History of claustrophobia    with MRI tests   Hyperlipidemia    Hypertension    IBS (irritable bowel syndrome)    Kidney stones    Personal history of colonic polyps 03/2010   hyperplastic   PONV (postoperative nausea and vomiting)    Shingles 2021   Past Surgical History:  Procedure Laterality Date   APPENDECTOMY     CATARACT EXTRACTION Right 04/29/2018   CATARACT EXTRACTION Left 03/2018   COLONOSCOPY     DILATATION & CURRETTAGE/HYSTEROSCOPY WITH RESECTOCOPE N/A 03/24/2013   Procedure: DILATATION & CURETTAGE, HYSTEROSCOPY WITH RESECTION;  Surgeon: Levi Aland, MD;  Location: [facility];  Service: Gynecology;  Laterality: N/A;   HYSTEROSCOPY WITH D & C N/A 11/08/2015   Procedure: DILATATION AND CURETTAGE /HYSTEROSCOPY;  Surgeon: Levi Aland, MD;  Location: [facility];  Service: Gynecology;  Laterality: N/A;   left breast cyst removal  2000   LEFT HEART CATH AND CORONARY  ANGIOGRAPHY N/A 10/31/2018   Procedure: LEFT HEART CATH AND CORONARY ANGIOGRAPHY;  Surgeon: Corky Crafts, MD;  Location: [facility] INVASIVE CV [facility];  Service: Cardiovascular;  Laterality: N/A;   TOTAL KNEE ARTHROPLASTY Right 09/12/2022   Procedure: RIGHT TOTAL KNEE ARTHROPLASTY;  Surgeon: Kathryne Hitch, MD;  Location: [facility];  Service: Orthopedics;  Laterality: Right;   TUBAL LIGATION     Patient Active Problem List   Diagnosis Date Noted   Status post total right knee replacement 09/12/2022   Degenerative arthritis of left knee 08/07/2022   Chronic rhinitis 04/18/2022   Gastroesophageal reflux disease 04/18/2022   Vitamin D deficiency 04/13/2022   Morbid obesity (HCC) 04/13/2022   Peripheral sensory neuropathy due to type 2 diabetes mellitus (HCC) 04/05/2020   Bilateral lower extremity edema 01/20/2020   Family history of malignant neoplasm of endometrium 10/08/2018   Celiac disease 02/14/2018   Osteopenia 02/14/2018   Mild intermittent asthma 02/14/2018   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) 02/14/2018   Class 2 obesity due to excess calories with body mass index (BMI) of 37.0 to 37.9 in adult  02/14/2018   Fibroma of tongue 06/06/2016   Renal calculi 01/13/2016   Degenerative cervical disc 07/27/2015   OSA (obstructive sleep apnea) 02/28/2011   Constipation, slow transit 09/23/2010   Type 2 diabetes mellitus with complication, with long-term current use of insulin (HCC) 09/23/2010   FH: colon cancer 09/23/2010   Personal history of colonic polyps 09/23/2010   Combined hyperlipidemia associated with type 2 diabetes mellitus (HCC) 01/29/2009   Diabetic gastroparesis (HCC) 02/27/2007   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 Mardelle Matte, MD   REFERRING PROVIDER: Vanita Panda. Magnus Ivan, MD  REFERRING DIAG: [phone] (ICD-10-CM) - Status post total  right knee replacement   THERAPY DIAG:  Stiffness of right knee, not elsewhere classified  Acute pain of right knee  Muscle weakness (generalized)  Localized edema  Other abnormalities of gait and mobility  Unsteadiness on feet  Rationale for Evaluation and Treatment: Rehabilitation  ONSET DATE: 09/12/2022  SUBJECTIVE:   SUBJECTIVE STATEMENT: Pt indicated no specific changes in symptoms since last visit.  Plan to do less in clinic next week.    PERTINENT HISTORY: Right TKA 09/12/2022, DM2, asthma, OA, peripheral neuropathy, osteopenia, shingles  PAIN:  NPRS scale: no pain upon arrival  Pain location: Rt  Pain description: throbbing Aggravating factors: nighttime Relieving factors: Tylenol, ice  PRECAUTIONS: Fall  WEIGHT BEARING RESTRICTIONS: No  FALLS:  Has patient fallen in last 6 months? No  LIVING ENVIRONMENT: Lives with: lives with their spouse Lives in: [place] with master handicap accessible bath & bedroom ramped entrance. Does not go upstairs.  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   PATIENT SURVEYS:  11/30/2022: FOTO update:    10/31/2022: FOTO visit 10 52%  Eval: FOTO intake:  21%  predicted:  47%  COGNITION:10/11/2022 Overall cognitive status: WFL    SENSATION: 10/11/2022 WFL  EDEMA:  10/11/2022 Circumferential:  LLE: above knee 51.5cm,  around knee 50.2 cm, below knee 42.5 cm RLE: above knee 55.5 cm,  around knee 53.8 cm, below knee 46.2 cm  POSTURE: 10/11/2022  rounded  shoulders, forward head, flexed trunk , and weight shift left  PALPATION: 10/11/2022 Right knee tenderness along joint line & incision.   LOWER EXTREMITY ROM:   ROM Right eval Right 10/13/22 Right 10/16/22 Right 10/25/22 Right 10/31/22 Right 11/08/2022 Right 11/16/22 Right 11/17/22 PROM Right 11/20/22 Right 11/23/22 Right 11/28/22  Knee flexion Seated A: 64* P: 74* Active 75 AA 84* Seated P: 87* A: 77* Seated P: 90* A:83* Supine AROM heel slide: 80    Supine and sitting 90*  Supine 87 deg  Sitting 99 deg s but not pain Seated P: 100* AA: 95* Supine A: 96 P: 107 Seated A: 99* P: 111*  Knee extension Seated A: LAQ -22* Seated with LE extended P: -11* Active -7 Supine A: -5*  Supine P: -1* A: quad set -3* SAQ -6* Seated AROM LAQ: -14  Supine quad set AROM -6 Seated with heel 14*, with overpressure 12*  Supine P: -1* A: quad set -4*  Seated  LAQ -9* Standing  Quad set -3* Supine P: -1*   (Blank rows = not tested)  LOWER EXTREMITY MMT:  MMT Right 10/11/2022 Right 11/08/2022 Right 11/16/22  Hip flexion     Hip extension     Hip abduction     Hip adduction     Hip internal rotation     Hip external rotation     Knee flexion 3-/5 5/5 in available range   Knee extension 3-/5 4-+/5 in available range 5 in available ROM  Ankle dorsiflexion     Ankle plantarflexion     Ankle inversion     Ankle eversion      (Blank rows = not tested)  FUNCTIONAL TESTS:  11/08/2022:  24 seconds with FWW  10/11/2022 18 inch chair transfer: requires BUE support to arise from 22" w/c to RW for stabilization TUG with RW: 41.75 sec  GAIT: 11/27/2022: gait velocity with cane 2.61 ft/sec and without device 2.11 ft/sec  11/08/2022:  Ambulation into clinic c FWW.   10/11/2022 Distance walked: 20' Assistive device utilized: Environmental consultant - 2 wheeled Level of assistance: SBA Comments: Antalgic gait pattern with decreased stance duration right lower extremity, right knee flexed in stance and  minimal increase flexion for swing phase and flexed trunk.                     TODAY'S TREATMENT                                               DATE: 11/30/2022: Therapeutic Exercise: UBE UE/LE for ROM seat 9 10 mins lvl 3.0  Leg press double leg 100 lbs in max flexion amount x 15, singel leg Rt 50 lbs 2 x 15 1 min bending stretch after each set.  Incline gastroc stretch bilateral 1 min x 3  Step up 6 inch with Rt leg WB x 15 with hands on bar  Manual Therapy: Seated Rt knee flexion mobilization c movement c IR  Vaso  right knee elevation 34* medium compression 10 min  TODAY'S TREATMENT                                               DATE: 11/29/2022: Therapeutic Exercise: Precor bike with BLEs seat 6 rocking for flexion stretch for 8 mins; assist. PT assisted backwards full revolution 3 reps, then pt performed 10 reps backward, then forward 1 min Leg press in max bending tolerated - double leg 100 lbs x 15 reps; single leg Rt 50 lbs 2 x 15.  Rest in bending for stretch Hamstring curl blue theraband 10 reps 2 sets LAQ 5# 2x10 seated  Neuromuscular Re-education: Side stepping on / off foam beam 10 reps to right & 10 reps to left with intermittent touch //bars for balance.  Stepping RLE onto foam beam SLS tapping LLE to cone: 10 reps RUE //bar, 10 reps LUE //bar, 10 reps with cane LUE  Manual Therapy: Seated knee flexion/ext PROM with overpressure and contract relax Mob belt for ext standing on incline board  Vaso right knee elevation 34* medium compression 10 min    TREATMENT  DATE: 11/28/2022 Therapeutic Exercise: Precor bike with BLEs seat 6 rocking for flexion stretch for 8 mins; 5 times full revolution backwards with PT manual assist. Then 1 min rocking and pt able to get full revolution backwards 5 reps without assistance Knee flexion stretch standing with Rt foot in 18" chair 10 sec hold 3 reps 4 sets moving LLE stance closer 1" for  each set Leg press in max bending tolerated - double leg 100 lbs x 15 reps; single leg Rt 50 lbs 2 x 15.  Rest in bending for stretch Hamstring curl blue theraband 10 reps 2 sets LAQ 5# 2x10 seated Standing with LUE support stepping over 6" hurdle alternating LEs 15 reps   Manual Therapy: Seated knee flexion/ext PROM with overpressure and contract relax Mob belt for ext standing on incline board  Vaso right knee elevation 34* medium compression 10 min    TREATMENT                                               DATE: 11/27/2022 Therapeutic Exercise: SciFit bike with BLEs & BUEs level 2 seat 9 for 8 mins  Leg press in max bending tolerated - double leg 87 lbs x 15 , single leg Rt 43 lbs 2 x 15.  Rest in bending for stretch Pt has a recumbent bike at home. PT demo & verbal cues adjusting seat for her leg length. She return demo with rocking for flexion stretch with Precor seat 6. Pt verbalized understanding. Hamstring stretch RLE long sit with strap 30 sec 2 reps LAQ 5# 2x10 seated Seated quad set with heel prop for inc ROM x25 heavy tactile/verbal cues, increased time to mitigate hip compensations Red band TKE standing with SLS 2 sets 10 reps with cane support  Attempted quad stretch supine hooklying but had back pain with position.  Therapeutic Activities: Pt amb 100' with cane safely Pt amb 100' without device safely Pt able to carry 10# basket safely. Pt reports that she was afraid to carry her laundry but this showed her that she could.   Manual Therapy: Seated knee flexion/ext PROM with overpressure and contract relax  Vaso right knee elevation 34* medium compression 10 min    HOME EXERCISE PROGRAM: Access Code: [email or web address] URL: [email or web address] Date: 11/06/2022 Prepared by: Vladimir Faster  Exercises - Ankle Alphabet in Elevation  - 2-4 x daily - 7 x weekly - 1 sets - 1 reps - supine quad set with towel roll under ankle  - 2-4 x daily - 7 x weekly - 2  sets - 10 reps - 5 seconds hold - Supine Heel Slide with Strap  - 2-3 x daily - 7 x weekly - 2-3 sets - 10 reps - 5 seconds hold - Seated Knee Flexion Extension AROM   - 2-4 x daily - 7 x weekly - 2-3 sets - 10 reps - 5 seconds hold - Supine Knee Extension Strengthening  - 2-4 x daily - 7 x weekly - 2-3 sets - 10 reps - 5 seconds hold - Seated Long Arc Quad  - 2-4 x daily - 7 x weekly - 2-3 sets - 10 reps - 5 seconds hold - standing calf stretch with forefoot on small step or brick  - 1 x daily - 7 x weekly - 1 sets - 3 reps - 30 seconds hold - Seated  Table Hamstring Stretch  - 1 x daily - 7 x weekly - 1 sets - 3 reps - 30 seconds hold - Supine Quadriceps Stretch with Strap on Table  - 1 x daily - 7 x weekly - 1 sets - 3 reps - 30 seconds hold - Standing Tandem Balance with Counter Support  - 1 x daily - 7 x weekly - 1 sets - 2 reps - 30 seconds hold - Heel raises near counter  - 1 x daily - 7 x weekly - 2 sets - 10 reps - 5 seconds hold  ASSESSMENT: CLINICAL IMPRESSION: WB activity limited due to strength, particularly noted on step activity.   Plan to continue to address mobility , strength and movement coordination deficits.    OBJECTIVE IMPAIRMENTS: Abnormal gait, decreased activity tolerance, decreased balance, decreased endurance, decreased knowledge of condition, decreased knowledge of use of DME, decreased mobility, difficulty walking, decreased ROM, decreased strength, increased edema, postural dysfunction, obesity, and pain.   ACTIVITY LIMITATIONS: carrying, lifting, bending, sitting, standing, squatting, sleeping, stairs, transfers, bed mobility, and locomotion level  PARTICIPATION LIMITATIONS: meal prep, cleaning, driving, community activity, and church  PERSONAL FACTORS: Fitness, Past/current experiences, Time since onset of injury/illness/exacerbation, and 3+ comorbidities: see PMH  are also affecting patient's functional outcome.   REHAB POTENTIAL: Good  CLINICAL DECISION  MAKING: Stable/uncomplicated  EVALUATION COMPLEXITY: Low   GOALS: Goals reviewed with patient? Yes  SHORT TERM GOALS: (target date for Short term goals 11/10/2022)   1.  Patient will demonstrate independent use of home exercise program to maintain progress from in clinic treatments. Baseline: see objective data Goal status:  MET 10/31/2022  2. PROM right knee ext -5* and flexion 90* Baseline: see objective data Goal status: MET 10/31/2022  3. Interim FOTO >32% Baseline: see objective data  Goal status:  MET 10/31/2022  LONG TERM GOALS: (target dates for all long term goals  12/21/2022 ) (for updated cert s/p manipulation)   1. Patient will demonstrate/report pain at worst less than or equal to 2/10 to facilitate minimal limitation in daily activity secondary to pain symptoms. Baseline: see objective data Goal status: Ongoing 11/29/2022   2. Patient will demonstrate independent use of home exercise program to facilitate ability to maintain/progress functional gains from skilled physical therapy services. Baseline: see objective data Goal status: Ongoing 11/29/2022   3. Patient will demonstrate FOTO outcome > or = 47 % to indicate reduced disability due to condition. Baseline: see objective data Goal status:  MET 11/29/2022   4.  Patient will demonstrate right knee MMT 4/5 throughout to faciltiate usual transfers, stairs, squatting at [facility] for daily life.  Baseline: see objective data Goal status: Ongoing 11/29/2022   5.  Patient right knee AROM ext standing -2* and flexion seated or supine 100* Baseline: see objective data Goal status: Ongoing 11/29/2022   6.  patient amb with LRAD >500' and negotiates ramps/curbs/stairs modified independent.  Baseline: see objective data Goal status: Ongoing  11/29/2022   PLAN:  PT FREQUENCY:  5x/week for 2 weeks, then drop to 2-3x/week for an additional 3 weeks   PT DURATION: 5 weeks   PLANNED INTERVENTIONS: Therapeutic exercises,  Therapeutic activity, Neuro Muscular re-education, Balance training, Gait training, Patient/Family education, Joint mobilization, Stair training, DME instructions, Dry Needling, Electrical stimulation, Traction, Cryotherapy, vasopneumatic deviceMoist heat, Taping, Ultrasound, Ionotophoresis 4mg /ml Dexamethasone, and aquatic therapy, Manual therapy.  All included unless contraindicated  PLAN FOR NEXT SESSION:  Variety of strengthening, WB improvements.    Chyrel Masson,  PT, DPT, OCS, ATC 11/30/22  1:50 PM

## 2022-11-30 NOTE — Progress Notes (Signed)
TeleHealth Visit:  This visit was completed with telemedicine (audio/video) technology. [name] has verbally consented to this TeleHealth visit. The patient is located at home, the provider is located at home. The participants in this visit include the listed provider and patient. The visit was conducted today via MyChart video.  OBESITY Jessica [name] is here to discuss her progress with her obesity treatment plan along with follow-up of her obesity related diagnoses.   Today's visit was # 42 Starting weight: 246 lbs Starting date: 03/12/2019 Weight at last in office visit: 209 lbs on 09/05/22 Total weight loss: 37 lbs at last in office visit on 09/05/22. Today's reported weight (11/30/22):  205 lbs  Nutrition Plan: the Category 3 plan - 85% adherence.  Current exercise:  PT 5 days per week  Interim History:  She had a right total knee replacement in April and has had some complications.  She is still having pain.  Goes to PT 5 days/week. Despite that she has lost about 4 pounds since her visit in May. She feels she is doing very well on the meal plan reports 85% adherence. Eating all of the food on the plan., Protein intake is as prescribed, Is not drinking sugar sweetened beverages., Is not skipping meals, Water intake is adequate., Denies polyphagia, and Denies excessive cravings.   Assessment/Plan:  1. Type 2 Diabetes Mellitus with polyneuropathy, with long-term current use of insulin HgbA1c is at goal. Last A1c was 6.3. CBGs: Fasting: 110-125, PP: 185-200. Notes increased CBGs with rice, potatoes, beans. Episodes of hypoglycemia:  rare Medication(s): Trulicity 0.75 mg SQ weekly, Lantus 30 units every night, Humalog 9 units 2-3 times per day before meals. Having nausea with the Trulicity.  Lab Results  Component Value Date   HGBA1C 6.3 (H) 08/31/2022   HGBA1C 7.2 (A) 06/14/2022   HGBA1C 6.6 (A) 02/08/2022   Lab Results  Component Value Date   MICROALBUR <0.7 10/19/2021    LDLCALC 72 04/12/2022   CREATININE 0.93 09/13/2022   Lab Results  Component Value Date   GFR 57.36 (L) 04/12/2022   GFR 60.62 06/09/2021   GFR 58.47 (L) 04/13/2021    Plan: Continue and refill Trulicity 0.75 mg SQ weekly Continue insulin at current dosages.   2. Nausea Has nausea with Trulicity at times.  Plan: Refill ondansetron 4 mg disintegrating tablet every 8 hours as needed.  3. Morbid Obesity: Current BMI 34 [place] is currently in the action stage of change. As such, her goal is to continue with weight loss efforts.  She has agreed to the Category 3 plan.  Exercise goals:  as is  Behavioral modification strategies: increasing lean protein intake, decreasing simple carbohydrates , and planning for success.  [name] has agreed to follow-up with our clinic in 4 weeks.  No orders of the defined types were placed in this encounter.   Medications Discontinued During This Encounter  Medication Reason   ondansetron (ZOFRAN ODT) 4 MG disintegrating tablet Reorder   Dulaglutide (TRULICITY) 0.75 MG/0.5ML SOPN Reorder     Meds ordered this encounter  Medications   ondansetron (ZOFRAN ODT) 4 MG disintegrating tablet    Sig: Take 1 tablet (4 mg total) by mouth every 8 (eight) hours as needed for nausea or vomiting.    Dispense:  30 tablet    Refill:  0    Order Specific Question:   Supervising Provider    Answer:   Glennis Brink [[id number]]   Dulaglutide (TRULICITY) 0.75 MG/0.5ML SOPN    Sig:  Inject into the skin weekly    Dispense:  2 mL    Refill:  0    Order Specific Question:   Supervising Provider    Answer:   Glennis Brink [[id number]]      Objective:   VITALS: Per patient if applicable, see vitals. GENERAL: Alert and in no acute distress. CARDIOPULMONARY: No increased WOB. Speaking in clear sentences.  PSYCH: Pleasant and cooperative. Speech normal rate and rhythm. Affect is appropriate. Insight and judgement are appropriate. Attention is focused, linear, and  appropriate.  NEURO: Oriented as arrived to appointment on time with no prompting.   Attestation Statements:   Reviewed by clinician on day of visit: allergies, medications, problem list, medical history, surgical history, family history, social history, and previous encounter notes.  This was prepared with the assistance of Engineer, civil (consulting).  Occasional wrong-word or sound-a-like substitutions may have occurred due to the inherent limitations of voice recognition software.

## 2022-12-01 ENCOUNTER — Encounter: Payer: Self-pay | Admitting: Rehabilitative and Restorative Service Providers"

## 2022-12-01 ENCOUNTER — Ambulatory Visit (INDEPENDENT_AMBULATORY_CARE_PROVIDER_SITE_OTHER): Payer: Medicare Other | Admitting: Rehabilitative and Restorative Service Providers"

## 2022-12-01 DIAGNOSIS — M25661 Stiffness of right knee, not elsewhere classified: Secondary | ICD-10-CM | POA: Diagnosis not present

## 2022-12-01 DIAGNOSIS — R2689 Other abnormalities of gait and mobility: Secondary | ICD-10-CM

## 2022-12-01 DIAGNOSIS — M25561 Pain in right knee: Secondary | ICD-10-CM | POA: Diagnosis not present

## 2022-12-01 DIAGNOSIS — R2681 Unsteadiness on feet: Secondary | ICD-10-CM

## 2022-12-01 DIAGNOSIS — R600 Localized edema: Secondary | ICD-10-CM | POA: Diagnosis not present

## 2022-12-01 DIAGNOSIS — M6281 Muscle weakness (generalized): Secondary | ICD-10-CM

## 2022-12-01 NOTE — Therapy (Signed)
OUTPATIENT PHYSICAL THERAPY  TREATMENT  Patient Name: Jessica [name] MRN: [id number] DOB:08/29/1950, 72 y.o., female Today's Date: 12/01/2022   END OF SESSION:  PT End of Session - 12/01/22 1351     Visit Number 26    Number of Visits 35    Date for PT Re-Evaluation 12/21/22    Authorization Type [employer] [facility]    Progress Note Due on Visit 33    PT Start Time 1347    PT Stop Time 1426    PT Time Calculation (min) 39 min    Activity Tolerance Patient tolerated treatment well    Behavior During Therapy WFL for tasks assessed/performed                                 Past Medical History:  Diagnosis Date   Anxiety    Arthritis    bilateral knees   Asthma    childhood, inhaler for wheezing PRN   Autonomic neuropathy    diabetic   Cataract    Celiac disease    Constipation    Depression    Diabetes mellitus    type II, Hemoglobin A1C 9.9 10/05/2011   Diabetic neuropathy (HCC)    Diverticulosis    Fatty liver    Gastroparesis    GERD (gastroesophageal reflux disease)    History of claustrophobia    with MRI tests   Hyperlipidemia    Hypertension    IBS (irritable bowel syndrome)    Kidney stones    Personal history of colonic polyps 03/2010   hyperplastic   PONV (postoperative nausea and vomiting)    Shingles 2021   Past Surgical History:  Procedure Laterality Date   APPENDECTOMY     CATARACT EXTRACTION Right 04/29/2018   CATARACT EXTRACTION Left 03/2018   COLONOSCOPY     DILATATION & CURRETTAGE/HYSTEROSCOPY WITH RESECTOCOPE N/A 03/24/2013   Procedure: DILATATION & CURETTAGE, HYSTEROSCOPY WITH RESECTION;  Surgeon: Levi Aland, MD;  Location: [facility];  Service: Gynecology;  Laterality: N/A;   HYSTEROSCOPY WITH D & C N/A 11/08/2015   Procedure: DILATATION AND CURETTAGE /HYSTEROSCOPY;  Surgeon: Levi Aland, MD;  Location: [facility];  Service: Gynecology;  Laterality: N/A;   left breast cyst removal  2000   LEFT HEART CATH AND CORONARY  ANGIOGRAPHY N/A 10/31/2018   Procedure: LEFT HEART CATH AND CORONARY ANGIOGRAPHY;  Surgeon: Corky Crafts, MD;  Location: [facility] INVASIVE CV LAB;  Service: Cardiovascular;  Laterality: N/A;   TOTAL KNEE ARTHROPLASTY Right 09/12/2022   Procedure: RIGHT TOTAL KNEE ARTHROPLASTY;  Surgeon: Kathryne Hitch, MD;  Location: [facility];  Service: Orthopedics;  Laterality: Right;   TUBAL LIGATION     Patient Active Problem List   Diagnosis Date Noted   Status post total right knee replacement 09/12/2022   Degenerative arthritis of left knee 08/07/2022   Chronic rhinitis 04/18/2022   Gastroesophageal reflux disease 04/18/2022   Vitamin D deficiency 04/13/2022   Morbid obesity (HCC) 04/13/2022   Peripheral sensory neuropathy due to type 2 diabetes mellitus (HCC) 04/05/2020   Bilateral lower extremity edema 01/20/2020   Family history of malignant neoplasm of endometrium 10/08/2018   Celiac disease 02/14/2018   Osteopenia 02/14/2018   Mild intermittent asthma 02/14/2018   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) 02/14/2018   Class 2 obesity due to excess calories with body mass index (BMI) of 37.0 to 37.9 in  adult 02/14/2018   Fibroma of tongue 06/06/2016   Renal calculi 01/13/2016   Degenerative cervical disc 07/27/2015   OSA (obstructive sleep apnea) 02/28/2011   Constipation, slow transit 09/23/2010   Type 2 diabetes mellitus with complication, with long-term current use of insulin (HCC) 09/23/2010   FH: colon cancer 09/23/2010   Personal history of colonic polyps 09/23/2010   Combined hyperlipidemia associated with type 2 diabetes mellitus (HCC) 01/29/2009   Diabetic gastroparesis (HCC) 02/27/2007   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 Mardelle Matte, MD   REFERRING PROVIDER: Vanita Panda. Magnus Ivan, MD  REFERRING DIAG: [phone] (ICD-10-CM) - Status post total  right knee replacement   THERAPY DIAG:  Stiffness of right knee, not elsewhere classified  Acute pain of right knee  Muscle weakness (generalized)  Localized edema  Other abnormalities of gait and mobility  Unsteadiness on feet  Rationale for Evaluation and Treatment: Rehabilitation  ONSET DATE: 09/12/2022  SUBJECTIVE:   SUBJECTIVE STATEMENT: She reported maybe a 2/10 riding the car.    PERTINENT HISTORY: Right TKA 09/12/2022, DM2, asthma, OA, peripheral neuropathy, osteopenia, shingles  PAIN:  NPRS scale: 2/10 Pain location: Rt knee  Pain description: tightness Aggravating factors: sitting prolonged Relieving factors: Tylenol, ice  PRECAUTIONS: Fall  WEIGHT BEARING RESTRICTIONS: No  FALLS:  Has patient fallen in last 6 months? No  LIVING ENVIRONMENT: Lives with: lives with their spouse Lives in: [place] with master handicap accessible bath & bedroom ramped entrance. Does not go upstairs.  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   PATIENT SURVEYS:  11/30/2022: FOTO update:    10/31/2022: FOTO visit 10 52%  Eval: FOTO intake:  21%  predicted:  47%  COGNITION:10/11/2022 Overall cognitive status: WFL    SENSATION: 10/11/2022 WFL  EDEMA:  10/11/2022 Circumferential:  LLE: above knee 51.5cm,  around knee 50.2 cm, below knee 42.5 cm RLE: above knee 55.5 cm,  around knee 53.8 cm, below knee 46.2 cm  POSTURE: 10/11/2022  rounded shoulders, forward head, flexed trunk , and weight shift  left  PALPATION: 10/11/2022 Right knee tenderness along joint line & incision.   LOWER EXTREMITY ROM:   ROM Right eval Right 10/13/22 Right 10/16/22 Right 10/25/22 Right 10/31/22 Right 11/08/2022 Right 11/16/22 Right 11/17/22 PROM Right 11/20/22 Right 11/23/22 Right 11/28/22  Knee flexion Seated A: 64* P: 74* Active 75 AA 84* Seated P: 87* A: 77* Seated P: 90* A:83* Supine AROM heel slide: 80    Supine and sitting 90*  Supine 87 deg  Sitting 99 deg s but not pain Seated P: 100* AA: 95* Supine A: 96 P: 107 Seated A: 99* P: 111*  Knee extension Seated A: LAQ -22* Seated with LE extended P: -11* Active -7 Supine A: -5*  Supine P: -1* A: quad set -3* SAQ -6* Seated AROM LAQ: -14  Supine quad set AROM -6 Seated with heel 14*, with overpressure 12*  Supine P: -1* A: quad set -4*  Seated  LAQ -9*  Standing  Quad set -3* Supine P: -1*   (Blank rows = not tested)  LOWER EXTREMITY MMT:  MMT Right 10/11/2022 Right 11/08/2022 Right 11/16/22  Hip flexion     Hip extension     Hip abduction     Hip adduction     Hip internal rotation     Hip external rotation     Knee flexion 3-/5 5/5 in available range   Knee extension 3-/5 4-+/5 in available range 5 in available ROM  Ankle dorsiflexion     Ankle plantarflexion     Ankle inversion     Ankle eversion      (Blank rows = not tested)  FUNCTIONAL TESTS:  12/01/2022:  TUG c SPC: 15.2 seconds,  13.63 seconds   11/08/2022:  TUG 24 seconds with FWW  10/11/2022 18 inch chair transfer: requires BUE support to arise from 22" w/c to RW for stabilization TUG with RW: 41.75 sec  GAIT: 11/27/2022: gait velocity with cane 2.61 ft/sec and without device 2.11 ft/sec  11/08/2022:  Ambulation into clinic c FWW.   10/11/2022 Distance walked: 20' Assistive device utilized: Environmental consultant - 2 wheeled Level of assistance: SBA Comments: Antalgic gait pattern with decreased stance duration right lower extremity, right knee flexed in stance and  minimal increase flexion for swing phase and flexed trunk.                     TODAY'S TREATMENT                                               DATE: 12/01/2022: Therapeutic Exercise: UBE UE/LE for ROM seat 9 10 mins lvl 3.0  Incline gastroc stretch bilateral 1 min x 3  TUG x 2 c SPC Ramp and curb 6 inch step x 3 each way with SPC and SBA  Neuro Re-ed Step to gait pattern over 6 inch hurdle in // bars with occasional HHA 10 ft x 3 leading with each LE Lateral step over hurdle 6 inch in // bars x 10 ft x 2 each way with occasional to moderate HHA Tandem stance on foam 1 min x 1 bilateral in // bars with occasional HHA   TODAY'S TREATMENT                                               DATE: 11/30/2022: Therapeutic Exercise: UBE UE/LE for ROM seat 9 10 mins lvl 3.0  Leg press double leg 100 lbs in max flexion amount x 15, singel leg Rt 50 lbs 2 x 15 1 min bending stretch after each set.  Incline gastroc stretch bilateral 1 min x 3  Step up 6 inch with Rt leg WB x 15 with hands on bar  Manual Therapy: Seated Rt knee flexion mobilization c movement c IR  Vaso  right knee elevation 34* medium compression 10 min  TODAY'S TREATMENT                                               DATE: 11/29/2022: Therapeutic Exercise: Precor bike with BLEs seat 6 rocking  for flexion stretch for 8 mins; assist. PT assisted backwards full revolution 3 reps, then pt performed 10 reps backward, then forward 1 min Leg press in max bending tolerated - double leg 100 lbs x 15 reps; single leg Rt 50 lbs 2 x 15.  Rest in bending for stretch Hamstring curl blue theraband 10 reps 2 sets LAQ 5# 2x10 seated  Neuromuscular Re-education: Side stepping on / off foam beam 10 reps to right & 10 reps to left with intermittent touch //bars for balance.  Stepping RLE onto foam beam SLS tapping LLE to cone: 10 reps RUE //bar, 10 reps LUE //bar, 10 reps with cane LUE  Manual Therapy: Seated knee flexion/ext PROM with overpressure  and contract relax Mob belt for ext standing on incline board  Vaso right knee elevation 34* medium compression 10 min    TREATMENT                                               DATE: 11/28/2022 Therapeutic Exercise: Precor bike with BLEs seat 6 rocking for flexion stretch for 8 mins; 5 times full revolution backwards with PT manual assist. Then 1 min rocking and pt able to get full revolution backwards 5 reps without assistance Knee flexion stretch standing with Rt foot in 18" chair 10 sec hold 3 reps 4 sets moving LLE stance closer 1" for each set Leg press in max bending tolerated - double leg 100 lbs x 15 reps; single leg Rt 50 lbs 2 x 15.  Rest in bending for stretch Hamstring curl blue theraband 10 reps 2 sets LAQ 5# 2x10 seated Standing with LUE support stepping over 6" hurdle alternating LEs 15 reps   Manual Therapy: Seated knee flexion/ext PROM with overpressure and contract relax Mob belt for ext standing on incline board  Vaso right knee elevation 34* medium compression 10 min   HOME EXERCISE PROGRAM: Access Code: [email or web address] URL: [email or web address] Date: 11/06/2022 Prepared by: Vladimir Faster  Exercises - Ankle Alphabet in Elevation  - 2-4 x daily - 7 x weekly - 1 sets - 1 reps - supine quad set with towel roll under ankle  - 2-4 x daily - 7 x weekly - 2 sets - 10 reps - 5 seconds hold - Supine Heel Slide with Strap  - 2-3 x daily - 7 x weekly - 2-3 sets - 10 reps - 5 seconds hold - Seated Knee Flexion Extension AROM   - 2-4 x daily - 7 x weekly - 2-3 sets - 10 reps - 5 seconds hold - Supine Knee Extension Strengthening  - 2-4 x daily - 7 x weekly - 2-3 sets - 10 reps - 5 seconds hold - Seated Long Arc Quad  - 2-4 x daily - 7 x weekly - 2-3 sets - 10 reps - 5 seconds hold - standing calf stretch with forefoot on small step or brick  - 1 x daily - 7 x weekly - 1 sets - 3 reps - 30 seconds hold - Seated Table Hamstring Stretch  - 1 x daily - 7 x weekly - 1  sets - 3 reps - 30 seconds hold - Supine Quadriceps Stretch with Strap on Table  - 1 x daily - 7 x weekly - 1 sets - 3 reps - 30 seconds hold - Standing Tandem Balance with Counter Support  -  1 x daily - 7 x weekly - 1 sets - 2 reps - 30 seconds hold - Heel raises near counter  - 1 x daily - 7 x weekly - 2 sets - 10 reps - 5 seconds hold  ASSESSMENT: CLINICAL IMPRESSION: Continued attempts at functional inclusion of improved knee flexion (hurdle clear stepping for example) with compensatory movements still noted mixed in activity.  Ambulated with [facility] to clinic today. TUG improved.    OBJECTIVE IMPAIRMENTS: Abnormal gait, decreased activity tolerance, decreased balance, decreased endurance, decreased knowledge of condition, decreased knowledge of use of DME, decreased mobility, difficulty walking, decreased ROM, decreased strength, increased edema, postural dysfunction, obesity, and pain.   ACTIVITY LIMITATIONS: carrying, lifting, bending, sitting, standing, squatting, sleeping, stairs, transfers, bed mobility, and locomotion level  PARTICIPATION LIMITATIONS: meal prep, cleaning, driving, community activity, and church  PERSONAL FACTORS: Fitness, Past/current experiences, Time since onset of injury/illness/exacerbation, and 3+ comorbidities: see PMH  are also affecting patient's functional outcome.   REHAB POTENTIAL: Good  CLINICAL DECISION MAKING: Stable/uncomplicated  EVALUATION COMPLEXITY: Low   GOALS: Goals reviewed with patient? Yes  SHORT TERM GOALS: (target date for Short term goals 11/10/2022)   1.  Patient will demonstrate independent use of home exercise program to maintain progress from in clinic treatments. Baseline: see objective data Goal status:  MET 10/31/2022  2. PROM right knee ext -5* and flexion 90* Baseline: see objective data Goal status: MET 10/31/2022  3. Interim FOTO >32% Baseline: see objective data  Goal status:  MET 10/31/2022  LONG TERM GOALS: (target  dates for all long term goals  12/21/2022 ) (for updated cert s/p manipulation)   1. Patient will demonstrate/report pain at worst less than or equal to 2/10 to facilitate minimal limitation in daily activity secondary to pain symptoms. Baseline: see objective data Goal status: Ongoing 11/29/2022   2. Patient will demonstrate independent use of home exercise program to facilitate ability to maintain/progress functional gains from skilled physical therapy services. Baseline: see objective data Goal status: Ongoing 11/29/2022   3. Patient will demonstrate FOTO outcome > or = 47 % to indicate reduced disability due to condition. Baseline: see objective data Goal status:  MET 11/29/2022   4.  Patient will demonstrate right knee MMT 4/5 throughout to faciltiate usual transfers, stairs, squatting at [facility] for daily life.  Baseline: see objective data Goal status: Ongoing 11/29/2022   5.  Patient right knee AROM ext standing -2* and flexion seated or supine 100* Baseline: see objective data Goal status: Ongoing 11/29/2022   6.  patient amb with LRAD >500' and negotiates ramps/curbs/stairs modified independent.  Baseline: see objective data Goal status: Ongoing  11/29/2022   PLAN:  PT FREQUENCY:  5x/week for 2 weeks, then drop to 2-3x/week for an additional 3 weeks   PT DURATION: 5 weeks   PLANNED INTERVENTIONS: Therapeutic exercises, Therapeutic activity, Neuro Muscular re-education, Balance training, Gait training, Patient/Family education, Joint mobilization, Stair training, DME instructions, Dry Needling, Electrical stimulation, Traction, Cryotherapy, vasopneumatic deviceMoist heat, Taping, Ultrasound, Ionotophoresis 4mg /ml Dexamethasone, and aquatic therapy, Manual therapy.  All included unless contraindicated  PLAN FOR NEXT SESSION:  Reduced frequency.  Progressive balance improvements and continued mobility gains.    Chyrel Masson, PT, DPT, OCS, ATC 12/01/22  2:26 PM

## 2022-12-04 ENCOUNTER — Ambulatory Visit (INDEPENDENT_AMBULATORY_CARE_PROVIDER_SITE_OTHER): Payer: Medicare Other | Admitting: Physical Therapy

## 2022-12-04 ENCOUNTER — Encounter: Payer: Self-pay | Admitting: Physical Therapy

## 2022-12-04 DIAGNOSIS — M6281 Muscle weakness (generalized): Secondary | ICD-10-CM

## 2022-12-04 DIAGNOSIS — R600 Localized edema: Secondary | ICD-10-CM | POA: Diagnosis not present

## 2022-12-04 DIAGNOSIS — M25561 Pain in right knee: Secondary | ICD-10-CM

## 2022-12-04 DIAGNOSIS — M25661 Stiffness of right knee, not elsewhere classified: Secondary | ICD-10-CM | POA: Diagnosis not present

## 2022-12-04 DIAGNOSIS — R2681 Unsteadiness on feet: Secondary | ICD-10-CM

## 2022-12-04 DIAGNOSIS — R2689 Other abnormalities of gait and mobility: Secondary | ICD-10-CM

## 2022-12-04 NOTE — Therapy (Signed)
OUTPATIENT PHYSICAL THERAPY  TREATMENT  Patient Name: Jessica [name] MRN: [id number] DOB:26-Sep-1950, 72 y.o., female Today's Date: 12/04/2022   END OF SESSION:  PT End of Session - 12/04/22 1350     Visit Number 27    Number of Visits 35    Date for PT Re-Evaluation 12/21/22    Authorization Type [employer] [facility]    Progress Note Due on Visit 33    PT Start Time 1345    PT Stop Time 1440    PT Time Calculation (min) 55 min    Activity Tolerance Patient tolerated treatment well    Behavior During Therapy WFL for tasks assessed/performed                                  Past Medical History:  Diagnosis Date   Anxiety    Arthritis    bilateral knees   Asthma    childhood, inhaler for wheezing PRN   Autonomic neuropathy    diabetic   Cataract    Celiac disease    Constipation    Depression    Diabetes mellitus    type II, Hemoglobin A1C 9.9 10/05/2011   Diabetic neuropathy (HCC)    Diverticulosis    Fatty liver    Gastroparesis    GERD (gastroesophageal reflux disease)    History of claustrophobia    with MRI tests   Hyperlipidemia    Hypertension    IBS (irritable bowel syndrome)    Kidney stones    Personal history of colonic polyps 03/2010   hyperplastic   PONV (postoperative nausea and vomiting)    Shingles 2021   Past Surgical History:  Procedure Laterality Date   APPENDECTOMY     CATARACT EXTRACTION Right 04/29/2018   CATARACT EXTRACTION Left 03/2018   COLONOSCOPY     DILATATION & CURRETTAGE/HYSTEROSCOPY WITH RESECTOCOPE N/A 03/24/2013   Procedure: DILATATION & CURETTAGE, HYSTEROSCOPY WITH RESECTION;  Surgeon: Levi Aland, MD;  Location: [facility];  Service: Gynecology;  Laterality: N/A;   HYSTEROSCOPY WITH D & C N/A 11/08/2015   Procedure: DILATATION AND CURETTAGE /HYSTEROSCOPY;  Surgeon: Levi Aland, MD;  Location: [facility];  Service: Gynecology;  Laterality: N/A;   left breast cyst removal  2000   LEFT HEART CATH AND  CORONARY ANGIOGRAPHY N/A 10/31/2018   Procedure: LEFT HEART CATH AND CORONARY ANGIOGRAPHY;  Surgeon: Corky Crafts, MD;  Location: [facility];  Service: Cardiovascular;  Laterality: N/A;   TOTAL KNEE ARTHROPLASTY Right 09/12/2022   Procedure: RIGHT TOTAL KNEE ARTHROPLASTY;  Surgeon: Kathryne Hitch, MD;  Location: [facility];  Service: Orthopedics;  Laterality: Right;   TUBAL LIGATION     Patient Active Problem List   Diagnosis Date Noted   Status post total right knee replacement 09/12/2022   Degenerative arthritis of left knee 08/07/2022   Chronic rhinitis 04/18/2022   Gastroesophageal reflux disease 04/18/2022   Vitamin D deficiency 04/13/2022   Morbid obesity (HCC) 04/13/2022   Peripheral sensory neuropathy due to type 2 diabetes mellitus (HCC) 04/05/2020   Bilateral lower extremity edema 01/20/2020   Family history of malignant neoplasm of endometrium 10/08/2018   Celiac disease 02/14/2018   Osteopenia 02/14/2018   Mild intermittent asthma 02/14/2018   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) 02/14/2018   Class 2 obesity due to excess calories with body mass index (BMI) of 37.0 to 37.9  in adult 02/14/2018   Fibroma of tongue 06/06/2016   Renal calculi 01/13/2016   Degenerative cervical disc 07/27/2015   OSA (obstructive sleep apnea) 02/28/2011   Constipation, slow transit 09/23/2010   Type 2 diabetes mellitus with complication, with long-term current use of insulin (HCC) 09/23/2010   FH: colon cancer 09/23/2010   Personal history of colonic polyps 09/23/2010   Combined hyperlipidemia associated with type 2 diabetes mellitus (HCC) 01/29/2009   Diabetic gastroparesis (HCC) 02/27/2007   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 Mardelle Matte, MD   REFERRING PROVIDER: Vanita Panda. Magnus Ivan, MD  REFERRING DIAG: [id number] (ICD-10-CM) - Status post  total right knee replacement   THERAPY DIAG:  Stiffness of right knee, not elsewhere classified  Acute pain of right knee  Muscle weakness (generalized)  Localized edema  Other abnormalities of gait and mobility  Unsteadiness on feet  Rationale for Evaluation and Treatment: Rehabilitation  ONSET DATE: 09/12/2022  SUBJECTIVE:   SUBJECTIVE STATEMENT: She was able to go to beauty shop and a funeral using her cane.    PERTINENT HISTORY: Right TKA 09/12/2022, DM2, asthma, OA, peripheral neuropathy, osteopenia, shingles  PAIN:  NPRS scale: today 0/10 and since last session 7/10 at night after funeral but was better next morning.  No pain driving to PT.  Pain location: Rt knee  Pain description: tightness Aggravating factors: sitting prolonged Relieving factors: Tylenol, ice  PRECAUTIONS: Fall  WEIGHT BEARING RESTRICTIONS: No  FALLS:  Has patient fallen in last 6 months? No  LIVING ENVIRONMENT: Lives with: lives with their spouse Lives in: [place] with master handicap accessible bath & bedroom ramped entrance. Does not go upstairs.  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   PATIENT SURVEYS:  11/30/2022: FOTO update:    10/31/2022: FOTO visit 10 52%  Eval: FOTO intake:  21%  predicted:  47%  COGNITION:10/11/2022 Overall cognitive status: WFL    SENSATION: 10/11/2022 WFL  EDEMA:  10/11/2022 Circumferential:  LLE: above knee 51.5cm,  around knee 50.2 cm, below knee 42.5 cm RLE: above knee 55.5  cm,  around knee 53.8 cm, below knee 46.2 cm  POSTURE: 10/11/2022  rounded shoulders, forward head, flexed trunk , and weight shift left  PALPATION: 10/11/2022 Right knee tenderness along joint line & incision.   LOWER EXTREMITY ROM:   ROM Right eval Right 10/13/22 Right 10/16/22 Right 10/25/22 Right 10/31/22 Right 11/08/2022 Right 11/16/22 Right 11/17/22 PROM Right 11/20/22 Right 11/23/22 Right 11/28/22  Knee flexion Seated A: 64* P: 74* Active 75 AA 84* Seated P: 87* A: 77* Seated P: 90* A:83* Supine AROM heel slide: 80    Supine and sitting 90*  Supine 87 deg  Sitting 99 deg s but not pain Seated P: 100* AA: 95* Supine A: 96 P: 107 Seated A: 99* P: 111*  Knee extension Seated A: LAQ -22* Seated with LE extended P: -11* Active -7 Supine A: -5*  Supine P: -1* A: quad set -3* SAQ -6* Seated  AROM LAQ: -14  Supine quad set AROM -6 Seated with heel 14*, with overpressure 12*  Supine P: -1* A: quad set -4*  Seated  LAQ -9* Standing  Quad set -3* Supine P: -1*   (Blank rows = not tested)  LOWER EXTREMITY MMT:  MMT Right 10/11/2022 Right 11/08/2022 Right 11/16/22  Hip flexion     Hip extension     Hip abduction     Hip adduction     Hip internal rotation     Hip external rotation     Knee flexion 3-/5 5/5 in available range   Knee extension 3-/5 4-+/5 in available range 5 in available ROM  Ankle dorsiflexion     Ankle plantarflexion     Ankle inversion     Ankle eversion      (Blank rows = not tested)  FUNCTIONAL TESTS:  12/01/2022:  TUG c SPC: 15.2 seconds,  13.63 seconds   11/08/2022:  TUG 24 seconds with FWW  10/11/2022 18 inch chair transfer: requires BUE support to arise from 22" w/c to RW for stabilization TUG with RW: 41.75 sec  GAIT: 11/27/2022: gait velocity with cane 2.61 ft/sec and without device 2.11 ft/sec  11/08/2022:  Ambulation into clinic c FWW.   10/11/2022 Distance walked: 20' Assistive device utilized: Environmental consultant - 2 wheeled Level of  assistance: SBA Comments: Antalgic gait pattern with decreased stance duration right lower extremity, right knee flexed in stance and minimal increase flexion for swing phase and flexed trunk.                     TODAY'S TREATMENT                                               DATE: 12/04/2022 Therapeutic Exercise: Precor bike with BLEs seat 6 rocking for flexion stretch for 6 mins; then backwards full revolutions 90 sec and forwards 2 min. Leg press in max bending tolerated - double leg 112 lbs x 15 reps; single leg Rt 50 lbs 2 x 15.  Rest in bending for stretch Hamstring curl blue theraband 10 reps 2 sets Standing RLE TKE blue theraband 10 reps 2 sets PT added above 2 exercises to HEP with HO, demo & verbal cues. Pt verbalized understanding.   LAQ 5# 2x10 seated  Manual Therapy: Seated knee flexion/ext PROM with overpressure and contract relax  Vaso right knee elevation 34* medium compression 10 min  TREATMENT                                               DATE: 12/01/2022: Therapeutic Exercise: UBE UE/LE for ROM seat 9 10 mins lvl 3.0  Incline gastroc stretch bilateral 1 min x 3  TUG x 2 c SPC Ramp and curb 6 inch step x 3 each way with SPC and SBA  Neuro Re-ed Step to gait pattern over 6 inch hurdle in // bars with occasional HHA 10 ft x 3 leading with each LE Lateral step over hurdle 6 inch in // bars x 10 ft x 2 each way with occasional to moderate HHA Tandem stance on foam 1 min x 1 bilateral in // bars with occasional HHA   TODAY'S TREATMENT  DATE: 11/30/2022: Therapeutic Exercise: UBE UE/LE for ROM seat 9 10 mins lvl 3.0  Leg press double leg 100 lbs in max flexion amount x 15, singel leg Rt 50 lbs 2 x 15 1 min bending stretch after each set.  Incline gastroc stretch bilateral 1 min x 3  Step up 6 inch with Rt leg WB x 15 with hands on bar  Manual Therapy: Seated Rt knee flexion mobilization c movement c IR  Vaso  right knee  elevation 34* medium compression 10 min  TREATMENT                                               DATE: 11/29/2022: Therapeutic Exercise: Precor bike with BLEs seat 6 rocking for flexion stretch for 8 mins; assist. PT assisted backwards full revolution 3 reps, then pt performed 10 reps backward, then forward 1 min Leg press in max bending tolerated - double leg 100 lbs x 15 reps; single leg Rt 50 lbs 2 x 15.  Rest in bending for stretch Hamstring curl blue theraband 10 reps 2 sets LAQ 5# 2x10 seated  Neuromuscular Re-education: Side stepping on / off foam beam 10 reps to right & 10 reps to left with intermittent touch //bars for balance.  Stepping RLE onto foam beam SLS tapping LLE to cone: 10 reps RUE //bar, 10 reps LUE //bar, 10 reps with cane LUE  Manual Therapy: Seated knee flexion/ext PROM with overpressure and contract relax Mob belt for ext standing on incline board  Vaso right knee elevation 34* medium compression 10 min   HOME EXERCISE PROGRAM: Access Code: [email or web address] URL: [email or web address] Date: 12/04/2022 Prepared by: Vladimir Faster  Exercises - Ankle Alphabet in Elevation  - 2-4 x daily - 7 x weekly - 1 sets - 1 reps - supine quad set with towel roll under ankle  - 2-4 x daily - 7 x weekly - 2 sets - 10 reps - 5 seconds hold - Supine Heel Slide with Strap  - 2-3 x daily - 7 x weekly - 2-3 sets - 10 reps - 5 seconds hold - Seated Knee Flexion Extension AROM   - 2-4 x daily - 7 x weekly - 2-3 sets - 10 reps - 5 seconds hold - Supine Knee Extension Strengthening  - 2-4 x daily - 7 x weekly - 2-3 sets - 10 reps - 5 seconds hold - Seated Long Arc Quad  - 2-4 x daily - 7 x weekly - 2-3 sets - 10 reps - 5 seconds hold - standing calf stretch with forefoot on small step or brick  - 1 x daily - 7 x weekly - 1 sets - 3 reps - 30 seconds hold - Seated Table Hamstring Stretch  - 1 x daily - 7 x weekly - 1 sets - 3 reps - 30 seconds hold - Supine Quadriceps Stretch with  Strap on Table  - 1 x daily - 7 x weekly - 1 sets - 3 reps - 30 seconds hold - Standing Tandem Balance with Counter Support  - 1 x daily - 7 x weekly - 1 sets - 2 reps - 30 seconds hold - Heel raises near counter  - 1 x daily - 7 x weekly - 2 sets - 10 reps - 5 seconds hold - Seated Hamstring Curl with Anchored Resistance  - 1 x  daily - 4 x weekly - 2-3 sets - 10 reps - 5 seconds hold - standing knee extension  - 1 x daily - 4 x weekly - 2-3 sets - 10 reps - 5 seconds hold  Patient Education - [facility] [employer]  ASSESSMENT: CLINICAL IMPRESSION: Patient appears to understand 2 exercises added to HEP. She reports able to stand longer & do more at home.  Pt continues to benefit from skilled PT     OBJECTIVE IMPAIRMENTS: Abnormal gait, decreased activity tolerance, decreased balance, decreased endurance, decreased knowledge of condition, decreased knowledge of use of DME, decreased mobility, difficulty walking, decreased ROM, decreased strength, increased edema, postural dysfunction, obesity, and pain.   ACTIVITY LIMITATIONS: carrying, lifting, bending, sitting, standing, squatting, sleeping, stairs, transfers, bed mobility, and locomotion level  PARTICIPATION LIMITATIONS: meal prep, cleaning, driving, community activity, and church  PERSONAL FACTORS: Fitness, Past/current experiences, Time since onset of injury/illness/exacerbation, and 3+ comorbidities: see PMH  are also affecting patient's functional outcome.   REHAB POTENTIAL: Good  CLINICAL DECISION MAKING: Stable/uncomplicated  EVALUATION COMPLEXITY: Low   GOALS: Goals reviewed with patient? Yes  SHORT TERM GOALS: (target date for Short term goals 11/10/2022)   1.  Patient will demonstrate independent use of home exercise program to maintain progress from in clinic treatments. Baseline: see objective data Goal status:  MET 10/31/2022  2. PROM right knee ext -5* and flexion 90* Baseline: see objective data Goal status: MET  10/31/2022  3. Interim FOTO >32% Baseline: see objective data  Goal status:  MET 10/31/2022  LONG TERM GOALS: (target dates for all long term goals  12/21/2022 ) (for updated cert s/p manipulation)   1. Patient will demonstrate/report pain at worst less than or equal to 2/10 to facilitate minimal limitation in daily activity secondary to pain symptoms. Baseline: see objective data Goal status: Ongoing 11/29/2022   2. Patient will demonstrate independent use of home exercise program to facilitate ability to maintain/progress functional gains from skilled physical therapy services. Baseline: see objective data Goal status: Ongoing 11/29/2022   3. Patient will demonstrate FOTO outcome > or = 47 % to indicate reduced disability due to condition. Baseline: see objective data Goal status:  MET 11/29/2022   4.  Patient will demonstrate right knee MMT 4/5 throughout to faciltiate usual transfers, stairs, squatting at [facility] for daily life.  Baseline: see objective data Goal status: Ongoing 11/29/2022   5.  Patient right knee AROM ext standing -2* and flexion seated or supine 100* Baseline: see objective data Goal status: Ongoing 11/29/2022   6.  patient amb with LRAD >500' and negotiates ramps/curbs/stairs modified independent.  Baseline: see objective data Goal status: Ongoing  11/29/2022   PLAN:  PT FREQUENCY:  5x/week for 2 weeks, then drop to 2-3x/week for an additional 3 weeks   PT DURATION: 5 weeks   PLANNED INTERVENTIONS: Therapeutic exercises, Therapeutic activity, Neuro Muscular re-education, Balance training, Gait training, Patient/Family education, Joint mobilization, Stair training, DME instructions, Dry Needling, Electrical stimulation, Traction, Cryotherapy, vasopneumatic deviceMoist heat, Taping, Ultrasound, Ionotophoresis 4mg /ml Dexamethasone, and aquatic therapy, Manual therapy.  All included unless contraindicated  PLAN FOR NEXT SESSION:  check on hamstring curl & TKE as HEP.   Measure ROM.  Progressive balance improvements and continued mobility gains.     Vladimir Faster, PT, DPT 12/04/2022, 2:36 PM

## 2022-12-06 ENCOUNTER — Ambulatory Visit (INDEPENDENT_AMBULATORY_CARE_PROVIDER_SITE_OTHER): Payer: Medicare Other | Admitting: Physical Therapy

## 2022-12-06 ENCOUNTER — Encounter: Payer: Self-pay | Admitting: Physical Therapy

## 2022-12-06 DIAGNOSIS — R600 Localized edema: Secondary | ICD-10-CM

## 2022-12-06 DIAGNOSIS — M25661 Stiffness of right knee, not elsewhere classified: Secondary | ICD-10-CM | POA: Diagnosis not present

## 2022-12-06 DIAGNOSIS — M25561 Pain in right knee: Secondary | ICD-10-CM

## 2022-12-06 DIAGNOSIS — M6281 Muscle weakness (generalized): Secondary | ICD-10-CM | POA: Diagnosis not present

## 2022-12-06 DIAGNOSIS — R2681 Unsteadiness on feet: Secondary | ICD-10-CM

## 2022-12-06 DIAGNOSIS — R2689 Other abnormalities of gait and mobility: Secondary | ICD-10-CM

## 2022-12-06 NOTE — Therapy (Signed)
OUTPATIENT PHYSICAL THERAPY  TREATMENT  Patient Name: Jessica [name] MRN: [id number] DOB:1951/02/16, 72 y.o., female Today's Date: 12/06/2022   END OF SESSION:  PT End of Session - 12/06/22 1343     Visit Number 28    Number of Visits 35    Date for PT Re-Evaluation 12/21/22    Authorization Type [employer] [facility]    Progress Note Due on Visit 33    PT Start Time 1343    PT Stop Time 1442    PT Time Calculation (min) 59 min    Activity Tolerance Patient tolerated treatment well    Behavior During Therapy WFL for tasks assessed/performed                                   Past Medical History:  Diagnosis Date   Anxiety    Arthritis    bilateral knees   Asthma    childhood, inhaler for wheezing PRN   Autonomic neuropathy    diabetic   Cataract    Celiac disease    Constipation    Depression    Diabetes mellitus    type II, Hemoglobin A1C 9.9 10/05/2011   Diabetic neuropathy (HCC)    Diverticulosis    Fatty liver    Gastroparesis    GERD (gastroesophageal reflux disease)    History of claustrophobia    with MRI tests   Hyperlipidemia    Hypertension    IBS (irritable bowel syndrome)    Kidney stones    Personal history of colonic polyps 03/2010   hyperplastic   PONV (postoperative nausea and vomiting)    Shingles 2021   Past Surgical History:  Procedure Laterality Date   APPENDECTOMY     CATARACT EXTRACTION Right 04/29/2018   CATARACT EXTRACTION Left 03/2018   COLONOSCOPY     DILATATION & CURRETTAGE/HYSTEROSCOPY WITH RESECTOCOPE N/A 03/24/2013   Procedure: DILATATION & CURETTAGE, HYSTEROSCOPY WITH RESECTION;  Surgeon: Levi Aland, MD;  Location: [facility];  Service: Gynecology;  Laterality: N/A;   HYSTEROSCOPY WITH D & C N/A 11/08/2015   Procedure: DILATATION AND CURETTAGE /HYSTEROSCOPY;  Surgeon: Levi Aland, MD;  Location: [facility];  Service: Gynecology;  Laterality: N/A;   left breast cyst removal  2000   LEFT HEART CATH AND  CORONARY ANGIOGRAPHY N/A 10/31/2018   Procedure: LEFT HEART CATH AND CORONARY ANGIOGRAPHY;  Surgeon: Corky Crafts, MD;  Location: [facility] INVASIVE CV [facility];  Service: Cardiovascular;  Laterality: N/A;   TOTAL KNEE ARTHROPLASTY Right 09/12/2022   Procedure: RIGHT TOTAL KNEE ARTHROPLASTY;  Surgeon: Kathryne Hitch, MD;  Location: [facility];  Service: Orthopedics;  Laterality: Right;   TUBAL LIGATION     Patient Active Problem List   Diagnosis Date Noted   Status post total right knee replacement 09/12/2022   Degenerative arthritis of left knee 08/07/2022   Chronic rhinitis 04/18/2022   Gastroesophageal reflux disease 04/18/2022   Vitamin D deficiency 04/13/2022   Morbid obesity (HCC) 04/13/2022   Peripheral sensory neuropathy due to type 2 diabetes mellitus (HCC) 04/05/2020   Bilateral lower extremity edema 01/20/2020   Family history of malignant neoplasm of endometrium 10/08/2018   Celiac disease 02/14/2018   Osteopenia 02/14/2018   Mild intermittent asthma 02/14/2018   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) 02/14/2018   Class 2 obesity due to excess calories with body mass index (BMI) of 37.0 to  37.9 in adult 02/14/2018   Fibroma of tongue 06/06/2016   Renal calculi 01/13/2016   Degenerative cervical disc 07/27/2015   OSA (obstructive sleep apnea) 02/28/2011   Constipation, slow transit 09/23/2010   Type 2 diabetes mellitus with complication, with long-term current use of insulin (HCC) 09/23/2010   FH: colon cancer 09/23/2010   Personal history of colonic polyps 09/23/2010   Combined hyperlipidemia associated with type 2 diabetes mellitus (HCC) 01/29/2009   Diabetic gastroparesis (HCC) 02/27/2007   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 Mardelle Matte, MD   REFERRING PROVIDER: Vanita Panda. Magnus Ivan, MD  REFERRING DIAG: [phone] (ICD-10-CM) - Status post  total right knee replacement   THERAPY DIAG:  Stiffness of right knee, not elsewhere classified  Acute pain of right knee  Muscle weakness (generalized)  Localized edema  Other abnormalities of gait and mobility  Unsteadiness on feet  Rationale for Evaluation and Treatment: Rehabilitation  ONSET DATE: 09/12/2022  SUBJECTIVE:   SUBJECTIVE STATEMENT: She walked to mailbox which is down her steep driveway using cane with no issues.    PERTINENT HISTORY: Right TKA 09/12/2022, DM2, asthma, OA, peripheral neuropathy, osteopenia, shingles  PAIN:  NPRS scale: today 0/10 and since last session 7/10 Pain location: Rt knee  Pain description: tightness Aggravating factors: sitting prolonged Relieving factors: Tylenol, ice  PRECAUTIONS: Fall  WEIGHT BEARING RESTRICTIONS: No  FALLS:  Has patient fallen in last 6 months? No  LIVING ENVIRONMENT: Lives with: lives with their spouse Lives in: [place] with master handicap accessible bath & bedroom ramped entrance. Does not go upstairs.  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   PATIENT SURVEYS:  11/30/2022: FOTO update:    10/31/2022: FOTO visit 10 52%  Eval: FOTO intake:  21%  predicted:  47%  COGNITION:10/11/2022 Overall cognitive status: WFL    SENSATION: 10/11/2022 WFL  EDEMA:  10/11/2022 Circumferential:  LLE: above knee 51.5cm,  around knee 50.2 cm, below knee 42.5 cm RLE: above knee 55.5 cm,  around knee 53.8 cm, below knee 46.2  cm  POSTURE: 10/11/2022  rounded shoulders, forward head, flexed trunk , and weight shift left  PALPATION: 10/11/2022 Right knee tenderness along joint line & incision.   LOWER EXTREMITY ROM:   ROM Right eval Right 10/13/22 Right 10/16/22 Right 10/25/22 Right 10/31/22 Right 11/08/2022 Right 11/16/22 Right 11/17/22 PROM Right 11/20/22 Right 11/23/22 Right 11/28/22 Right 12/06/22  Knee flexion Seated A: 64* P: 74* Active 75 AA 84* Seated P: 87* A: 77* Seated P: 90* A:83* Supine AROM heel slide: 80    Supine and sitting 90*  Supine 87 deg  Sitting 99 deg s but not pain Seated P: 100* AA: 95* Supine A: 96 P: 107 Seated A: 99* P: 111* Seated A: 103* P: 111*  Knee extension Seated A: LAQ -22* Seated with LE extended P: -11* Active -7 Supine A: -5*  Supine P: -1* A: quad set -3* SAQ -6* Seated AROM LAQ: -14  Supine quad set  AROM -6 Seated with heel 14*, with overpressure 12*  Supine P: -1* A: quad set -4*  Seated  LAQ -9* Standing  Quad set -3* Supine P: -1* Seated  LAQ -9* Standing  Quad set -2*   (Blank rows = not tested)  LOWER EXTREMITY MMT:  MMT Right 10/11/2022 Right 11/08/2022 Right 11/16/22  Hip flexion     Hip extension     Hip abduction     Hip adduction     Hip internal rotation     Hip external rotation     Knee flexion 3-/5 5/5 in available range   Knee extension 3-/5 4-+/5 in available range 5 in available ROM  Ankle dorsiflexion     Ankle plantarflexion     Ankle inversion     Ankle eversion      (Blank rows = not tested)  FUNCTIONAL TESTS:  12/01/2022:  TUG c SPC: 15.2 seconds,  13.63 seconds   11/08/2022:  TUG 24 seconds with FWW  10/11/2022 18 inch chair transfer: requires BUE support to arise from 22" w/c to RW for stabilization TUG with RW: 41.75 sec  GAIT: 11/27/2022: gait velocity with cane 2.61 ft/sec and without device 2.11 ft/sec  11/08/2022:  Ambulation into clinic c FWW.   10/11/2022 Distance walked: 20' Assistive  device utilized: Environmental consultant - 2 wheeled Level of assistance: SBA Comments: Antalgic gait pattern with decreased stance duration right lower extremity, right knee flexed in stance and minimal increase flexion for swing phase and flexed trunk.                     TODAY'S TREATMENT                                               DATE: 12/06/2022: Therapeutic Exercise: Precor bike with BLEs seat 6 rocking for flexion stretch for 6 mins; then backwards full revolutions 90 sec and forwards 2 min. Leg press in max bending tolerated - double leg 112 lbs x 15 reps; single leg Rt 50 lbs 2 x 15.  Rest in bending for stretch Hamstring curl blue theraband 10 reps 2 sets Standing RLE TKE blue theraband 10 reps 2 sets    Manual Therapy: Seated knee flexion/ext PROM with overpressure and contract relax  Vaso right knee elevation 34* medium compression 10 min  TREATMENT                                               DATE: 12/04/2022 Therapeutic Exercise: Precor bike with BLEs seat 6 rocking for flexion stretch for 6 mins; then backwards full revolutions 90 sec and forwards 2 min. Leg press in max bending tolerated - double leg 112 lbs x 15 reps; single leg Rt 50 lbs 2 x 15.  Rest in bending for stretch Hamstring curl blue theraband 10 reps 2 sets Standing RLE TKE blue theraband 10 reps 2 sets PT added above 2 exercises to HEP with HO, demo & verbal cues. Pt verbalized understanding.   LAQ 5# 2x10 seated  Manual Therapy: Seated knee flexion/ext PROM with overpressure and contract relax Knee ext overpressure with mob belt standing on incline board.   Vaso right knee elevation 34* medium compression 10 min  TREATMENT                                               DATE: 12/01/2022: Therapeutic Exercise: UBE UE/LE for ROM seat 9 10 mins lvl 3.0  Incline gastroc stretch bilateral 1 min x 3  TUG x 2 c SPC Ramp and curb 6 inch step x 3 each way with SPC and SBA  Neuro Re-ed Step to gait pattern over 6 inch hurdle  in // bars with occasional HHA 10 ft x 3 leading with each LE Lateral step over hurdle 6 inch in // bars x 10 ft x 2 each way with occasional to moderate HHA Tandem stance on foam 1 min x 1 bilateral in // bars with occasional HHA     HOME EXERCISE PROGRAM: Access Code: [email or web address] URL: [email or web address] Date: 12/04/2022 Prepared by: Vladimir Faster  Exercises - Ankle Alphabet in Elevation  - 2-4 x daily - 7 x weekly - 1 sets - 1 reps - supine quad set with towel roll under ankle  - 2-4 x daily - 7 x weekly - 2 sets - 10 reps - 5 seconds hold - Supine Heel Slide with Strap  - 2-3 x daily - 7 x weekly - 2-3 sets - 10 reps - 5 seconds hold - Seated Knee Flexion Extension AROM   - 2-4 x daily - 7 x weekly - 2-3 sets - 10 reps - 5 seconds hold - Supine Knee Extension Strengthening  - 2-4 x daily - 7 x weekly - 2-3 sets - 10 reps - 5 seconds hold - Seated Long Arc Quad  - 2-4 x daily - 7 x weekly - 2-3 sets - 10 reps - 5 seconds hold - standing calf stretch with forefoot on small step or brick  - 1 x daily - 7 x weekly - 1 sets - 3 reps - 30 seconds hold - Seated Table Hamstring Stretch  - 1 x daily - 7 x weekly - 1 sets - 3 reps - 30 seconds hold - Supine Quadriceps Stretch with Strap on Table  - 1 x daily - 7 x weekly - 1 sets - 3 reps - 30 seconds hold - Standing Tandem Balance with Counter Support  - 1 x daily - 7 x weekly - 1 sets - 2 reps - 30 seconds hold - Heel raises near counter  - 1 x daily - 7 x weekly - 2 sets - 10 reps - 5 seconds hold - Seated Hamstring Curl with Anchored Resistance  - 1 x daily - 4 x weekly - 2-3 sets - 10 reps - 5 seconds hold - standing knee extension  - 1 x daily - 4 x weekly - 2-3 sets - 10 reps - 5 seconds hold  Patient Education - [facility]  ASSESSMENT: CLINICAL IMPRESSION: Patient's range continues to improve.  She is walking with better knee flexion in swing but lacks extension in stance.  Pt continues to benefit from skilled  PT     OBJECTIVE IMPAIRMENTS: Abnormal gait, decreased activity tolerance, decreased balance, decreased endurance, decreased knowledge of condition, decreased knowledge of use of DME, decreased mobility, difficulty walking, decreased ROM, decreased strength, increased edema, postural dysfunction, obesity, and pain.   ACTIVITY LIMITATIONS: carrying, lifting, bending, sitting, standing, squatting, sleeping, stairs, transfers, bed mobility, and locomotion level  PARTICIPATION LIMITATIONS: meal  prep, cleaning, driving, community activity, and church  PERSONAL FACTORS: Fitness, Past/current experiences, Time since onset of injury/illness/exacerbation, and 3+ comorbidities: see PMH  are also affecting patient's functional outcome.   REHAB POTENTIAL: Good  CLINICAL DECISION MAKING: Stable/uncomplicated  EVALUATION COMPLEXITY: Low   GOALS: Goals reviewed with patient? Yes  SHORT TERM GOALS: (target date for Short term goals 11/10/2022)   1.  Patient will demonstrate independent use of home exercise program to maintain progress from in clinic treatments. Baseline: see objective data Goal status:  MET 10/31/2022  2. PROM right knee ext -5* and flexion 90* Baseline: see objective data Goal status: MET 10/31/2022  3. Interim FOTO >32% Baseline: see objective data  Goal status:  MET 10/31/2022  LONG TERM GOALS: (target dates for all long term goals  12/21/2022 ) (for updated cert s/p manipulation)   1. Patient will demonstrate/report pain at worst less than or equal to 2/10 to facilitate minimal limitation in daily activity secondary to pain symptoms. Baseline: see objective data Goal status: Ongoing 11/29/2022   2. Patient will demonstrate independent use of home exercise program to facilitate ability to maintain/progress functional gains from skilled physical therapy services. Baseline: see objective data Goal status: Ongoing 11/29/2022   3. Patient will demonstrate FOTO outcome > or = 47 % to  indicate reduced disability due to condition. Baseline: see objective data Goal status:  MET 11/29/2022   4.  Patient will demonstrate right knee MMT 4/5 throughout to faciltiate usual transfers, stairs, squatting at [facility] for daily life.  Baseline: see objective data Goal status: Ongoing 11/29/2022   5.  Patient right knee AROM ext standing -2* and flexion seated or supine 100* Baseline: see objective data Goal status: Ongoing 11/29/2022   6.  patient amb with LRAD >500' and negotiates ramps/curbs/stairs modified independent.  Baseline: see objective data Goal status: Ongoing  11/29/2022   PLAN:  PT FREQUENCY:  5x/week for 2 weeks, then drop to 2-3x/week for an additional 3 weeks   PT DURATION: 5 weeks   PLANNED INTERVENTIONS: Therapeutic exercises, Therapeutic activity, Neuro Muscular re-education, Balance training, Gait training, Patient/Family education, Joint mobilization, Stair training, DME instructions, Dry Needling, Electrical stimulation, Traction, Cryotherapy, vasopneumatic deviceMoist heat, Taping, Ultrasound, Ionotophoresis 4mg /ml Dexamethasone, and aquatic therapy, Manual therapy.  All included unless contraindicated  PLAN FOR NEXT SESSION: aquatic PT visit to instruct in exercises in pool, Progressive balance improvements and continued mobility gains.     Vladimir Faster, PT, DPT 12/06/2022, 4:11 PM

## 2022-12-07 ENCOUNTER — Other Ambulatory Visit: Payer: Self-pay

## 2022-12-07 ENCOUNTER — Telehealth: Payer: Self-pay | Admitting: Family Medicine

## 2022-12-07 MED ORDER — VALACYCLOVIR HCL 500 MG PO TABS: 500.0000 mg | ORAL_TABLET | Freq: Every day | ORAL | 3 refills | Status: DC

## 2022-12-07 NOTE — Telephone Encounter (Signed)
Rx sent 

## 2022-12-07 NOTE — Telephone Encounter (Signed)
Prescription Request  12/07/2022  LOV: 10/09/2022  What is the name of the medication or equipment?  valACYclovir (VALTREX) 500 MG tablet   Have you contacted your pharmacy to request a refill? Yes   Which pharmacy would you like this sent to? [id number] - [place], [place] - [address] [place] [address] Phone: [phone] Fax: [phone]   Patient notified that their request is being sent to the clinical staff for review and that they should receive a response within 2 business days.   Please advise at Mobile [phone] (mobile)

## 2022-12-08 ENCOUNTER — Encounter: Payer: Medicare Other | Admitting: Rehabilitative and Restorative Service Providers"

## 2022-12-08 ENCOUNTER — Ambulatory Visit (INDEPENDENT_AMBULATORY_CARE_PROVIDER_SITE_OTHER): Payer: Medicare Other | Admitting: Physical Therapy

## 2022-12-08 ENCOUNTER — Encounter: Payer: Self-pay | Admitting: Physical Therapy

## 2022-12-08 DIAGNOSIS — M25561 Pain in right knee: Secondary | ICD-10-CM | POA: Diagnosis not present

## 2022-12-08 DIAGNOSIS — M25661 Stiffness of right knee, not elsewhere classified: Secondary | ICD-10-CM | POA: Diagnosis not present

## 2022-12-08 DIAGNOSIS — R600 Localized edema: Secondary | ICD-10-CM | POA: Diagnosis not present

## 2022-12-08 DIAGNOSIS — R2689 Other abnormalities of gait and mobility: Secondary | ICD-10-CM

## 2022-12-08 DIAGNOSIS — M6281 Muscle weakness (generalized): Secondary | ICD-10-CM | POA: Diagnosis not present

## 2022-12-08 NOTE — Therapy (Signed)
OUTPATIENT PHYSICAL THERAPY  TREATMENT  Patient Name: Jessica [name] MRN: [id number] DOB:December 03, 1950, 72 y.o., female Today's Date: 12/08/2022   END OF SESSION:  PT End of Session - 12/08/22 1013     Visit Number 29    Number of Visits 35    Date for PT Re-Evaluation 12/21/22    Authorization Type [employer] [facility]    Progress Note Due on Visit 33    PT Start Time 1015    PT Stop Time 1055    PT Time Calculation (min) 40 min    Activity Tolerance Patient tolerated treatment well    Behavior During Therapy WFL for tasks assessed/performed                                    Past Medical History:  Diagnosis Date   Anxiety    Arthritis    bilateral knees   Asthma    childhood, inhaler for wheezing PRN   Autonomic neuropathy    diabetic   Cataract    Celiac disease    Constipation    Depression    Diabetes mellitus    type II, Hemoglobin A1C 9.9 10/05/2011   Diabetic neuropathy (HCC)    Diverticulosis    Fatty liver    Gastroparesis    GERD (gastroesophageal reflux disease)    History of claustrophobia    with MRI tests   Hyperlipidemia    Hypertension    IBS (irritable bowel syndrome)    Kidney stones    Personal history of colonic polyps 03/2010   hyperplastic   PONV (postoperative nausea and vomiting)    Shingles 2021   Past Surgical History:  Procedure Laterality Date   APPENDECTOMY     CATARACT EXTRACTION Right 04/29/2018   CATARACT EXTRACTION Left 03/2018   COLONOSCOPY     DILATATION & CURRETTAGE/HYSTEROSCOPY WITH RESECTOCOPE N/A 03/24/2013   Procedure: DILATATION & CURETTAGE, HYSTEROSCOPY WITH RESECTION;  Surgeon: Levi Aland, MD;  Location: [facility];  Service: Gynecology;  Laterality: N/A;   HYSTEROSCOPY WITH D & C N/A 11/08/2015   Procedure: DILATATION AND CURETTAGE /HYSTEROSCOPY;  Surgeon: Levi Aland, MD;  Location: [facility];  Service: Gynecology;  Laterality: N/A;   left breast cyst removal  2000   LEFT HEART CATH AND  CORONARY ANGIOGRAPHY N/A 10/31/2018   Procedure: LEFT HEART CATH AND CORONARY ANGIOGRAPHY;  Surgeon: Corky Crafts, MD;  Location: [facility];  Service: Cardiovascular;  Laterality: N/A;   TOTAL KNEE ARTHROPLASTY Right 09/12/2022   Procedure: RIGHT TOTAL KNEE ARTHROPLASTY;  Surgeon: Kathryne Hitch, MD;  Location: [facility];  Service: Orthopedics;  Laterality: Right;   TUBAL LIGATION     Patient Active Problem List   Diagnosis Date Noted   Status post total right knee replacement 09/12/2022   Degenerative arthritis of left knee 08/07/2022   Chronic rhinitis 04/18/2022   Gastroesophageal reflux disease 04/18/2022   Vitamin D deficiency 04/13/2022   Morbid obesity (HCC) 04/13/2022   Peripheral sensory neuropathy due to type 2 diabetes mellitus (HCC) 04/05/2020   Bilateral lower extremity edema 01/20/2020   Family history of malignant neoplasm of endometrium 10/08/2018   Celiac disease 02/14/2018   Osteopenia 02/14/2018   Mild intermittent asthma 02/14/2018   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) 02/14/2018   Class 2 obesity due to excess calories with body mass index (BMI) of 37.0  to 37.9 in adult 02/14/2018   Fibroma of tongue 06/06/2016   Renal calculi 01/13/2016   Degenerative cervical disc 07/27/2015   OSA (obstructive sleep apnea) 02/28/2011   Constipation, slow transit 09/23/2010   Type 2 diabetes mellitus with complication, with long-term current use of insulin (HCC) 09/23/2010   FH: colon cancer 09/23/2010   Personal history of colonic polyps 09/23/2010   Combined hyperlipidemia associated with type 2 diabetes mellitus (HCC) 01/29/2009   Diabetic gastroparesis (HCC) 02/27/2007   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 Mardelle Matte, MD   REFERRING PROVIDER: Vanita Panda. Magnus Ivan, MD  REFERRING DIAG: [phone] (ICD-10-CM) - Status post  total right knee replacement   THERAPY DIAG:  Stiffness of right knee, not elsewhere classified  Acute pain of right knee  Muscle weakness (generalized)  Localized edema  Other abnormalities of gait and mobility  Rationale for Evaluation and Treatment: Rehabilitation  ONSET DATE: 09/12/2022  SUBJECTIVE:   SUBJECTIVE STATEMENT: She relays the pain is not bad today overall, she is a little nervous about the water therapy today.  PERTINENT HISTORY: Right TKA 09/12/2022, DM2, asthma, OA, peripheral neuropathy, osteopenia, shingles  PAIN:  NPRS scale: today 0/10 and since last session 7/10 Pain location: Rt knee  Pain description: tightness Aggravating factors: sitting prolonged Relieving factors: Tylenol, ice  PRECAUTIONS: Fall  WEIGHT BEARING RESTRICTIONS: No  FALLS:  Has patient fallen in last 6 months? No  LIVING ENVIRONMENT: Lives with: lives with their spouse Lives in: [place] with master handicap accessible bath & bedroom ramped entrance. Does not go upstairs.  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   PATIENT SURVEYS:  11/30/2022: FOTO update:    10/31/2022: FOTO visit 10 52%  Eval: FOTO intake:  21%  predicted:  47%  COGNITION:10/11/2022 Overall cognitive status: WFL    SENSATION: 10/11/2022 WFL  EDEMA:  10/11/2022 Circumferential:  LLE: above knee 51.5cm,  around knee 50.2 cm, below knee 42.5 cm RLE: above knee 55.5 cm,  around knee 53.8 cm, below knee 46.2  cm  POSTURE: 10/11/2022  rounded shoulders, forward head, flexed trunk , and weight shift left  PALPATION: 10/11/2022 Right knee tenderness along joint line & incision.   LOWER EXTREMITY ROM:   ROM Right eval Right 10/13/22 Right 10/16/22 Right 10/25/22 Right 10/31/22 Right 11/08/2022 Right 11/16/22 Right 11/17/22 PROM Right 11/20/22 Right 11/23/22 Right 11/28/22 Right 12/06/22  Knee flexion Seated A: 64* P: 74* Active 75 AA 84* Seated P: 87* A: 77* Seated P: 90* A:83* Supine AROM heel slide: 80    Supine and sitting 90*  Supine 87 deg  Sitting 99 deg s but not pain Seated P: 100* AA: 95* Supine A: 96 P: 107 Seated A: 99* P: 111* Seated A: 103* P: 111*  Knee extension Seated A: LAQ -22* Seated with LE extended P: -11* Active -7 Supine A: -5*  Supine P: -1* A: quad set -3* SAQ -6* Seated AROM LAQ: -14  Supine quad set AROM -  6 Seated with heel 14*, with overpressure 12*  Supine P: -1* A: quad set -4*  Seated  LAQ -9* Standing  Quad set -3* Supine P: -1* Seated  LAQ -9* Standing  Quad set -2*   (Blank rows = not tested)  LOWER EXTREMITY MMT:  MMT Right 10/11/2022 Right 11/08/2022 Right 11/16/22  Hip flexion     Hip extension     Hip abduction     Hip adduction     Hip internal rotation     Hip external rotation     Knee flexion 3-/5 5/5 in available range   Knee extension 3-/5 4-+/5 in available range 5 in available ROM  Ankle dorsiflexion     Ankle plantarflexion     Ankle inversion     Ankle eversion      (Blank rows = not tested)  FUNCTIONAL TESTS:  12/01/2022:  TUG c SPC: 15.2 seconds,  13.63 seconds   11/08/2022:  TUG 24 seconds with FWW  10/11/2022 18 inch chair transfer: requires BUE support to arise from 22" w/c to RW for stabilization TUG with RW: 41.75 sec  GAIT: 11/27/2022: gait velocity with cane 2.61 ft/sec and without device 2.11 ft/sec  11/08/2022:  Ambulation into clinic c FWW.   10/11/2022 Distance walked: 20' Assistive  device utilized: Environmental consultant - 2 wheeled Level of assistance: SBA Comments: Antalgic gait pattern with decreased stance duration right lower extremity, right knee flexed in stance and minimal increase flexion for swing phase and flexed trunk.                    TODAY's TREATMENT 12/08/22 Pt seen for aquatic therapy today.  Treatment took place in water 3.5-4.75 ft in depth at the [facility] pool. Temp of water was 91.  Pt entered/exited the pool via stairs with hand rail.   Pt requires the buoyancy and hydrostatic pressure of water for support, and to offload joints by unweighting joint load by at least 50 % in navel deep water and by at least 75-80% in chest to neck deep water.  Viscosity of the water is needed for resistance of strengthening. Water current perturbations provides challenge to standing balance requiring increased core activation. Aquatic PT exercises Seated on pool chair and cycling 2 min Seated knee flexion stretch AAROM 5 sec X 15 Standing hamstring curls with UE support X 15 bilat Leg swings hip abd/add X 15 bilat with UE support with UE support Leg swings hip flexion/extension X 15 bilat Sidestepping length of pool 3 round trips  Forward walking  3 round trips backward walking 3 round trips Heel and toe raises 3X12 with UE support  Step ups onto first step of pool with UE support X 15 bilat 1/4 squat with back against wall, shoulder extension X15 with kickboard 1/4 squat with back against wall, push/pull kickboard 2X15  Water PT HEP Exercises emailed to her   TREATMENT                                               DATE: 12/06/2022: Therapeutic Exercise: Precor bike with BLEs seat 6 rocking for flexion stretch for 6 mins; then backwards full revolutions 90 sec and forwards 2 min. Leg press in max bending tolerated - double leg 112 lbs x 15 reps; single leg Rt 50 lbs 2 x 15.  Rest in  bending for stretch Hamstring curl blue theraband 10 reps 2 sets Standing RLE  TKE blue theraband 10 reps 2 sets    Manual Therapy: Seated knee flexion/ext PROM with overpressure and contract relax  Vaso right knee elevation 34* medium compression 10 min  TREATMENT                                               DATE: 12/04/2022 Therapeutic Exercise: Precor bike with BLEs seat 6 rocking for flexion stretch for 6 mins; then backwards full revolutions 90 sec and forwards 2 min. Leg press in max bending tolerated - double leg 112 lbs x 15 reps; single leg Rt 50 lbs 2 x 15.  Rest in bending for stretch Hamstring curl blue theraband 10 reps 2 sets Standing RLE TKE blue theraband 10 reps 2 sets PT added above 2 exercises to HEP with HO, demo & verbal cues. Pt verbalized understanding.   LAQ 5# 2x10 seated  Manual Therapy: Seated knee flexion/ext PROM with overpressure and contract relax Knee ext overpressure with mob belt standing on incline board.   Vaso right knee elevation 34* medium compression 10 min  TREATMENT                                               DATE: 12/01/2022: Therapeutic Exercise: UBE UE/LE for ROM seat 9 10 mins lvl 3.0  Incline gastroc stretch bilateral 1 min x 3  TUG x 2 c SPC Ramp and curb 6 inch step x 3 each way with SPC and SBA  Neuro Re-ed Step to gait pattern over 6 inch hurdle in // bars with occasional HHA 10 ft x 3 leading with each LE Lateral step over hurdle 6 inch in // bars x 10 ft x 2 each way with occasional to moderate HHA Tandem stance on foam 1 min x 1 bilateral in // bars with occasional HHA     HOME EXERCISE PROGRAM: Access Code: [email or web address] URL: [email or web address] Date: 12/04/2022 Prepared by: Vladimir Faster  Exercises - Ankle Alphabet in Elevation  - 2-4 x daily - 7 x weekly - 1 sets - 1 reps - supine quad set with towel roll under ankle  - 2-4 x daily - 7 x weekly - 2 sets - 10 reps - 5 seconds hold - Supine Heel Slide with Strap  - 2-3 x daily - 7 x weekly - 2-3 sets - 10 reps - 5 seconds hold -  Seated Knee Flexion Extension AROM   - 2-4 x daily - 7 x weekly - 2-3 sets - 10 reps - 5 seconds hold - Supine Knee Extension Strengthening  - 2-4 x daily - 7 x weekly - 2-3 sets - 10 reps - 5 seconds hold - Seated Long Arc Quad  - 2-4 x daily - 7 x weekly - 2-3 sets - 10 reps - 5 seconds hold - standing calf stretch with forefoot on small step or brick  - 1 x daily - 7 x weekly - 1 sets - 3 reps - 30 seconds hold - Seated Table Hamstring Stretch  - 1 x daily - 7 x weekly - 1 sets - 3 reps - 30 seconds hold - Supine Quadriceps Stretch with Strap  on Table  - 1 x daily - 7 x weekly - 1 sets - 3 reps - 30 seconds hold - Standing Tandem Balance with Counter Support  - 1 x daily - 7 x weekly - 1 sets - 2 reps - 30 seconds hold - Heel raises near counter  - 1 x daily - 7 x weekly - 2 sets - 10 reps - 5 seconds hold - Seated Hamstring Curl with Anchored Resistance  - 1 x daily - 4 x weekly - 2-3 sets - 10 reps - 5 seconds hold - standing knee extension  - 1 x daily - 4 x weekly - 2-3 sets - 10 reps - 5 seconds hold  Patient Education - Environmental health practitioner  WATER HEP Access Code: [email or web address] URL: [email or web address] Date: 12/08/2022 Prepared by: Ivery Quale  Exercises - Standing March at [facility]  - 1 x daily - 2 x weekly - 15-20 reps - Standing Hip Flexion Extension at [employer]  - 1 x daily - 2 x weekly - 15-20 reps - Standing Hip Abduction Adduction at Pool Wall  - 1 x daily - 2 x weekly - 15-20 reps - Standing Knee Flexion  - 2 x daily - 6 x weekly - 3 sets - 15-20 reps - Squat  - 1 x daily - 2 x weekly - 15-20 reps - Standing Single Leg Hip Circles  - 1 x daily - 2 x weekly - 1 sets - 15-20 reps - Forward Walking  - 1 x daily - 2 x weekly - 4 sets  ASSESSMENT: CLINICAL IMPRESSION:She was a little anxious as we first started Aquatic PT session but then she had good overall comfort and tolerance to this. We did most of session with UE support to ease her anxiety about the  water. I did also email her a copy of the aquatic HEP exercises. She did want to set up one more aquatic visit so I also did this for her today.    OBJECTIVE IMPAIRMENTS: Abnormal gait, decreased activity tolerance, decreased balance, decreased endurance, decreased knowledge of condition, decreased knowledge of use of DME, decreased mobility, difficulty walking, decreased ROM, decreased strength, increased edema, postural dysfunction, obesity, and pain.   ACTIVITY LIMITATIONS: carrying, lifting, bending, sitting, standing, squatting, sleeping, stairs, transfers, bed mobility, and locomotion level  PARTICIPATION LIMITATIONS: meal prep, cleaning, driving, community activity, and church  PERSONAL FACTORS: Fitness, Past/current experiences, Time since onset of injury/illness/exacerbation, and 3+ comorbidities: see PMH  are also affecting patient's functional outcome.   REHAB POTENTIAL: Good  CLINICAL DECISION MAKING: Stable/uncomplicated  EVALUATION COMPLEXITY: Low   GOALS: Goals reviewed with patient? Yes  SHORT TERM GOALS: (target date for Short term goals 11/10/2022)   1.  Patient will demonstrate independent use of home exercise program to maintain progress from in clinic treatments. Baseline: see objective data Goal status:  MET 10/31/2022  2. PROM right knee ext -5* and flexion 90* Baseline: see objective data Goal status: MET 10/31/2022  3. Interim FOTO >32% Baseline: see objective data  Goal status:  MET 10/31/2022  LONG TERM GOALS: (target dates for all long term goals  12/21/2022 ) (for updated cert s/p manipulation)   1. Patient will demonstrate/report pain at worst less than or equal to 2/10 to facilitate minimal limitation in daily activity secondary to pain symptoms. Baseline: see objective data Goal status: Ongoing 11/29/2022   2. Patient will demonstrate independent use of home exercise program to facilitate ability to maintain/progress functional gains from  skilled  physical therapy services. Baseline: see objective data Goal status: Ongoing 11/29/2022   3. Patient will demonstrate FOTO outcome > or = 47 % to indicate reduced disability due to condition. Baseline: see objective data Goal status:  MET 11/29/2022   4.  Patient will demonstrate right knee MMT 4/5 throughout to faciltiate usual transfers, stairs, squatting at [facility] for daily life.  Baseline: see objective data Goal status: Ongoing 11/29/2022   5.  Patient right knee AROM ext standing -2* and flexion seated or supine 100* Baseline: see objective data Goal status: Ongoing 11/29/2022   6.  patient amb with LRAD >500' and negotiates ramps/curbs/stairs modified independent.  Baseline: see objective data Goal status: Ongoing  11/29/2022   PLAN:  PT FREQUENCY:  5x/week for 2 weeks, then drop to 2-3x/week for an additional 3 weeks   PT DURATION: 5 weeks   PLANNED INTERVENTIONS: Therapeutic exercises, Therapeutic activity, Neuro Muscular re-education, Balance training, Gait training, Patient/Family education, Joint mobilization, Stair training, DME instructions, Dry Needling, Electrical stimulation, Traction, Cryotherapy, vasopneumatic deviceMoist heat, Taping, Ultrasound, Ionotophoresis 4mg /ml Dexamethasone, and aquatic therapy, Manual therapy.  All included unless contraindicated  PLAN FOR NEXT SESSION: Progressive balance and strength improvements and continued mobility gains.     April Manson, PT, DPT 12/08/2022, 10:21 AM

## 2022-12-11 ENCOUNTER — Ambulatory Visit (INDEPENDENT_AMBULATORY_CARE_PROVIDER_SITE_OTHER): Payer: Medicare Other | Admitting: Physical Therapy

## 2022-12-11 ENCOUNTER — Encounter: Payer: Self-pay | Admitting: Physical Therapy

## 2022-12-11 DIAGNOSIS — M25661 Stiffness of right knee, not elsewhere classified: Secondary | ICD-10-CM | POA: Diagnosis not present

## 2022-12-11 DIAGNOSIS — R600 Localized edema: Secondary | ICD-10-CM

## 2022-12-11 DIAGNOSIS — M6281 Muscle weakness (generalized): Secondary | ICD-10-CM

## 2022-12-11 DIAGNOSIS — M25561 Pain in right knee: Secondary | ICD-10-CM

## 2022-12-11 DIAGNOSIS — R2689 Other abnormalities of gait and mobility: Secondary | ICD-10-CM

## 2022-12-11 DIAGNOSIS — R2681 Unsteadiness on feet: Secondary | ICD-10-CM

## 2022-12-11 NOTE — Therapy (Signed)
OUTPATIENT PHYSICAL THERAPY  TREATMENT  Patient Name: Jessica [name] MRN: [id number] DOB:[birth year], 72 y.o., female Today's Date: 12/11/2022   END OF SESSION:  PT End of Session - 12/11/22 1346     Visit Number 30    Number of Visits 35    Date for PT Re-Evaluation 12/21/22    Authorization Type [employer] [facility]    Progress Note Due on Visit 33    PT Start Time 1346    PT Stop Time 1443    PT Time Calculation (min) 57 min    Activity Tolerance Patient tolerated treatment well    Behavior During Therapy WFL for tasks assessed/performed                                     Past Medical History:  Diagnosis Date   Anxiety    Arthritis    bilateral knees   Asthma    childhood, inhaler for wheezing PRN   Autonomic neuropathy    diabetic   Cataract    Celiac disease    Constipation    Depression    Diabetes mellitus    type II, Hemoglobin A1C 9.9 10/05/2011   Diabetic neuropathy (HCC)    Diverticulosis    Fatty liver    Gastroparesis    GERD (gastroesophageal reflux disease)    History of claustrophobia    with MRI tests   Hyperlipidemia    Hypertension    IBS (irritable bowel syndrome)    Kidney stones    Personal history of colonic polyps 03/2010   hyperplastic   PONV (postoperative nausea and vomiting)    Shingles 2021   Past Surgical History:  Procedure Laterality Date   APPENDECTOMY     CATARACT EXTRACTION Right 04/29/2018   CATARACT EXTRACTION Left 03/2018   COLONOSCOPY     DILATATION & CURRETTAGE/HYSTEROSCOPY WITH RESECTOCOPE N/A 03/24/2013   Procedure: DILATATION & CURETTAGE, HYSTEROSCOPY WITH RESECTION;  Surgeon: Levi Aland, MD;  Location: [facility];  Service: Gynecology;  Laterality: N/A;   HYSTEROSCOPY WITH D & C N/A 11/08/2015   Procedure: DILATATION AND CURETTAGE /HYSTEROSCOPY;  Surgeon: Levi Aland, MD;  Location: [facility];  Service: Gynecology;  Laterality: N/A;   left breast cyst removal  2000   LEFT HEART CATH AND  CORONARY ANGIOGRAPHY N/A 10/31/2018   Procedure: LEFT HEART CATH AND CORONARY ANGIOGRAPHY;  Surgeon: Corky Crafts, MD;  Location: [facility] INVASIVE CV [facility];  Service: Cardiovascular;  Laterality: N/A;   TOTAL KNEE ARTHROPLASTY Right 09/12/2022   Procedure: RIGHT TOTAL KNEE ARTHROPLASTY;  Surgeon: Kathryne Hitch, MD;  Location: [facility];  Service: Orthopedics;  Laterality: Right;   TUBAL LIGATION     Patient Active Problem List   Diagnosis Date Noted   Status post total right knee replacement 09/12/2022   Degenerative arthritis of left knee 08/07/2022   Chronic rhinitis 04/18/2022   Gastroesophageal reflux disease 04/18/2022   Vitamin D deficiency 04/13/2022   Morbid obesity (HCC) 04/13/2022   Peripheral sensory neuropathy due to type 2 diabetes mellitus (HCC) 04/05/2020   Bilateral lower extremity edema 01/20/2020   Family history of malignant neoplasm of endometrium 10/08/2018   Celiac disease 02/14/2018   Osteopenia 02/14/2018   Mild intermittent asthma 02/14/2018   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) 02/14/2018   Class 2 obesity due to excess calories with body mass index (BMI) of  37.0 to 37.9 in adult 02/14/2018   Fibroma of tongue 06/06/2016   Renal calculi 01/13/2016   Degenerative cervical disc 07/27/2015   OSA (obstructive sleep apnea) 02/28/2011   Constipation, slow transit 09/23/2010   Type 2 diabetes mellitus with complication, with long-term current use of insulin (HCC) 09/23/2010   FH: colon cancer 09/23/2010   Personal history of colonic polyps 09/23/2010   Combined hyperlipidemia associated with type 2 diabetes mellitus (HCC) 01/29/2009   Diabetic gastroparesis (HCC) 02/27/2007   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 Mardelle Matte, MD   REFERRING PROVIDER: Vanita Panda. Magnus Ivan, MD  REFERRING DIAG: [phone] (ICD-10-CM) - Status post  total right knee replacement   THERAPY DIAG:  Stiffness of right knee, not elsewhere classified  Acute pain of right knee  Muscle weakness (generalized)  Localized edema  Other abnormalities of gait and mobility  Unsteadiness on feet  Rationale for Evaluation and Treatment: Rehabilitation  ONSET DATE: 09/12/2022  SUBJECTIVE:   SUBJECTIVE STATEMENT: She was a little nervous wit aquatic therapy at first but now feels that she can & will do it at [facility]  She went to church using cane with less issues and was able to stand to cook prior & after church.   PERTINENT HISTORY: Right TKA 09/12/2022, DM2, asthma, OA, peripheral neuropathy, osteopenia, shingles  PAIN:  NPRS scale: today 3/10 and since last session 0/10 - 4/10 Pain location: Rt knee  Pain description: tightness Aggravating factors: sitting prolonged Relieving factors: Tylenol, ice  PRECAUTIONS: Fall  WEIGHT BEARING RESTRICTIONS: No  FALLS:  Has patient fallen in last 6 months? No  LIVING ENVIRONMENT: Lives with: lives with their spouse Lives in: [place] with master handicap accessible bath & bedroom ramped entrance. Does not go upstairs.  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   PATIENT SURVEYS:  11/30/2022: FOTO update:    10/31/2022: FOTO visit 10 52%  Eval: FOTO intake:  21%  predicted:  47%  COGNITION:10/11/2022 Overall cognitive status: WFL    SENSATION: 10/11/2022 WFL  EDEMA:  10/11/2022 Circumferential:  LLE: above knee  51.5cm,  around knee 50.2 cm, below knee 42.5 cm RLE: above knee 55.5 cm,  around knee 53.8 cm, below knee 46.2 cm  POSTURE: 10/11/2022  rounded shoulders, forward head, flexed trunk , and weight shift left  PALPATION: 10/11/2022 Right knee tenderness along joint line & incision.   LOWER EXTREMITY ROM:   ROM Right eval Right 10/13/22 Right 10/16/22 Right 10/25/22 Right 10/31/22 Right 11/08/2022 Right 11/16/22 Right 11/17/22 PROM Right 11/20/22 Right 11/23/22 Right 11/28/22 Right 12/06/22  Knee flexion Seated A: 64* P: 74* Active 75 AA 84* Seated P: 87* A: 77* Seated P: 90* A:83* Supine AROM heel slide: 80    Supine and sitting 90*  Supine 87 deg  Sitting 99 deg s but not pain Seated P: 100* AA: 95* Supine A: 96 P: 107 Seated A: 99* P: 111* Seated A: 103* P: 111*  Knee extension Seated A:  LAQ -22* Seated with LE extended P: -11* Active -7 Supine A: -5*  Supine P: -1* A: quad set -3* SAQ -6* Seated AROM LAQ: -14  Supine quad set AROM -6 Seated with heel 14*, with overpressure 12*  Supine P: -1* A: quad set -4*  Seated  LAQ -9* Standing  Quad set -3* Supine P: -1* Seated  LAQ -9* Standing  Quad set -2*   (Blank rows = not tested)  LOWER EXTREMITY MMT:  MMT Right 10/11/2022 Right 11/08/2022 Right 11/16/22  Hip flexion     Hip extension     Hip abduction     Hip adduction     Hip internal rotation     Hip external rotation     Knee flexion 3-/5 5/5 in available range   Knee extension 3-/5 4-+/5 in available range 5 in available ROM  Ankle dorsiflexion     Ankle plantarflexion     Ankle inversion     Ankle eversion      (Blank rows = not tested)  FUNCTIONAL TESTS:  12/01/2022:  TUG c SPC: 15.2 seconds,  13.63 seconds   11/08/2022:  TUG 24 seconds with FWW  10/11/2022 18 inch chair transfer: requires BUE support to arise from 22" w/c to RW for stabilization TUG with RW: 41.75 sec  GAIT: 11/27/2022: gait velocity with cane 2.61 ft/sec and without  device 2.11 ft/sec  11/08/2022:  Ambulation into clinic c FWW.   10/11/2022 Distance walked: 20' Assistive device utilized: Environmental consultant - 2 wheeled Level of assistance: SBA Comments: Antalgic gait pattern with decreased stance duration right lower extremity, right knee flexed in stance and minimal increase flexion for swing phase and flexed trunk.                    TODAY's TREATMENT                                               DATE: 12/11/2022 Therapeutic Exercise: Precor bike with BLEs seat 6 rocking for flexion stretch for 1.5 mins; then backwards full revolutions 1.5 min and forwards 5 min level 1.  PT advised at [facility] ride 10 min 2 sets with timed rest 5 min decreasing to no rest as tolerated. Once she can ride 20 min straight, then add 1 min until can ride for 30 min.  Pt verbalized understanding.  Leg press in max bending tolerated - double leg 112 lbs x 15 reps; single leg Rt 56 lbs 2 x 15.  Rest in bending for stretch Slider to 3 cones (ant-lat, lat & post-lat) stance LE flexion on outward motion & extension on inward motion. Ipsilateral UE support on sink. 5 reps ea LE.   Knee extension machine BLEs 15# 15 reps (20# was too heavy);  concentric BLEs & isometric / eccentric RLE 5# 10 reps 2 sets Seated knee flexion machine BLEs 25# 10 reps;  RLE only 10# 10 reps 2 sets.  Manual Therapy: Seated knee flexion/ext PROM with overpressure and contract relax  Vaso right knee elevation 34* medium compression 10 min   TREATMENT 12/08/22 Pt seen for aquatic therapy today.  Treatment took place in water 3.5-4.75 ft in depth at the [facility] pool. Temp of water was 91.  Pt entered/exited the pool via stairs with hand rail.   Pt requires the buoyancy and hydrostatic pressure of  water for support, and to offload joints by unweighting joint load by at least 50 % in navel deep water and by at least 75-80% in chest to neck deep water.  Viscosity of the water is needed for resistance of  strengthening. Water current perturbations provides challenge to standing balance requiring increased core activation. Aquatic PT exercises Seated on pool chair and cycling 2 min Seated knee flexion stretch AAROM 5 sec X 15 Standing hamstring curls with UE support X 15 bilat Leg swings hip abd/add X 15 bilat with UE support with UE support Leg swings hip flexion/extension X 15 bilat Sidestepping length of pool 3 round trips  Forward walking  3 round trips backward walking 3 round trips Heel and toe raises 3X12 with UE support  Step ups onto first step of pool with UE support X 15 bilat 1/4 squat with back against wall, shoulder extension X15 with kickboard 1/4 squat with back against wall, push/pull kickboard 2X15  Water PT HEP Exercises emailed to her   TREATMENT                                               DATE: 12/06/2022: Therapeutic Exercise: Precor bike with BLEs seat 6 rocking for flexion stretch for 6 mins; then backwards full revolutions 90 sec and forwards 2 min. Leg press in max bending tolerated - double leg 112 lbs x 15 reps; single leg Rt 50 lbs 2 x 15.  Rest in bending for stretch Hamstring curl blue theraband 10 reps 2 sets Standing RLE TKE blue theraband 10 reps 2 sets    Manual Therapy: Seated knee flexion/ext PROM with overpressure and contract relax  Vaso right knee elevation 34* medium compression 10 min  TREATMENT                                               DATE: 12/04/2022 Therapeutic Exercise: Precor bike with BLEs seat 6 rocking for flexion stretch for 6 mins; then backwards full revolutions 90 sec and forwards 2 min. Leg press in max bending tolerated - double leg 112 lbs x 15 reps; single leg Rt 50 lbs 2 x 15.  Rest in bending for stretch Hamstring curl blue theraband 10 reps 2 sets Standing RLE TKE blue theraband 10 reps 2 sets PT added above 2 exercises to HEP with HO, demo & verbal cues. Pt verbalized understanding.   LAQ 5# 2x10 seated  Manual  Therapy: Seated knee flexion/ext PROM with overpressure and contract relax Knee ext overpressure with mob belt standing on incline board.   Vaso right knee elevation 34* medium compression 10 min    HOME EXERCISE PROGRAM: Access Code: [email or web address] URL: [email or web address] Date: 12/04/2022 Prepared by: Vladimir Faster  Exercises - Ankle Alphabet in Elevation  - 2-4 x daily - 7 x weekly - 1 sets - 1 reps - supine quad set with towel roll under ankle  - 2-4 x daily - 7 x weekly - 2 sets - 10 reps - 5 seconds hold - Supine Heel Slide with Strap  - 2-3 x daily - 7 x weekly - 2-3 sets - 10 reps - 5 seconds hold - Seated Knee Flexion Extension AROM   - 2-4 x daily - 7  x weekly - 2-3 sets - 10 reps - 5 seconds hold - Supine Knee Extension Strengthening  - 2-4 x daily - 7 x weekly - 2-3 sets - 10 reps - 5 seconds hold - Seated Long Arc Quad  - 2-4 x daily - 7 x weekly - 2-3 sets - 10 reps - 5 seconds hold - standing calf stretch with forefoot on small step or brick  - 1 x daily - 7 x weekly - 1 sets - 3 reps - 30 seconds hold - Seated Table Hamstring Stretch  - 1 x daily - 7 x weekly - 1 sets - 3 reps - 30 seconds hold - Supine Quadriceps Stretch with Strap on Table  - 1 x daily - 7 x weekly - 1 sets - 3 reps - 30 seconds hold - Standing Tandem Balance with Counter Support  - 1 x daily - 7 x weekly - 1 sets - 2 reps - 30 seconds hold - Heel raises near counter  - 1 x daily - 7 x weekly - 2 sets - 10 reps - 5 seconds hold - Seated Hamstring Curl with Anchored Resistance  - 1 x daily - 4 x weekly - 2-3 sets - 10 reps - 5 seconds hold - standing knee extension  - 1 x daily - 4 x weekly - 2-3 sets - 10 reps - 5 seconds hold  Patient Education - Environmental health practitioner  WATER HEP Access Code: [email or web address] URL: [email or web address] Date: 12/08/2022 Prepared by: Ivery Quale  Exercises - Standing March at [facility]  - 1 x daily - 2 x weekly - 15-20 reps - Standing Hip Flexion  Extension at [employer]  - 1 x daily - 2 x weekly - 15-20 reps - Standing Hip Abduction Adduction at Pool Wall  - 1 x daily - 2 x weekly - 15-20 reps - Standing Knee Flexion  - 2 x daily - 6 x weekly - 3 sets - 15-20 reps - Squat  - 1 x daily - 2 x weekly - 15-20 reps - Standing Single Leg Hip Circles  - 1 x daily - 2 x weekly - 1 sets - 15-20 reps - Forward Walking  - 1 x daily - 2 x weekly - 4 sets  ASSESSMENT: CLINICAL IMPRESSION:  PT added weight machines to strengthening program which she tolerated well but will need more instruction prior to trying at [facility].  Pt reports doing more activities outside of PT.    OBJECTIVE IMPAIRMENTS: Abnormal gait, decreased activity tolerance, decreased balance, decreased endurance, decreased knowledge of condition, decreased knowledge of use of DME, decreased mobility, difficulty walking, decreased ROM, decreased strength, increased edema, postural dysfunction, obesity, and pain.   ACTIVITY LIMITATIONS: carrying, lifting, bending, sitting, standing, squatting, sleeping, stairs, transfers, bed mobility, and locomotion level  PARTICIPATION LIMITATIONS: meal prep, cleaning, driving, community activity, and church  PERSONAL FACTORS: Fitness, Past/current experiences, Time since onset of injury/illness/exacerbation, and 3+ comorbidities: see PMH  are also affecting patient's functional outcome.   REHAB POTENTIAL: Good  CLINICAL DECISION MAKING: Stable/uncomplicated  EVALUATION COMPLEXITY: Low   GOALS: Goals reviewed with patient? Yes  SHORT TERM GOALS: (target date for Short term goals 11/10/2022)   1.  Patient will demonstrate independent use of home exercise program to maintain progress from in clinic treatments. Baseline: see objective data Goal status:  MET 10/31/2022  2. PROM right knee ext -5* and flexion 90* Baseline: see objective data Goal status: MET 10/31/2022  3. Interim FOTO >  32% Baseline: see objective data  Goal status:  MET  10/31/2022  LONG TERM GOALS: (target dates for all long term goals  12/21/2022 ) (for updated cert s/p manipulation)   1. Patient will demonstrate/report pain at worst less than or equal to 2/10 to facilitate minimal limitation in daily activity secondary to pain symptoms. Baseline: see objective data Goal status: Ongoing 11/29/2022   2. Patient will demonstrate independent use of home exercise program to facilitate ability to maintain/progress functional gains from skilled physical therapy services. Baseline: see objective data Goal status: Ongoing 11/29/2022   3. Patient will demonstrate FOTO outcome > or = 47 % to indicate reduced disability due to condition. Baseline: see objective data Goal status:  MET 11/29/2022   4.  Patient will demonstrate right knee MMT 4/5 throughout to faciltiate usual transfers, stairs, squatting at [facility] for daily life.  Baseline: see objective data Goal status: Ongoing 11/29/2022   5.  Patient right knee AROM ext standing -2* and flexion seated or supine 100* Baseline: see objective data Goal status: Ongoing 11/29/2022   6.  patient amb with LRAD >500' and negotiates ramps/curbs/stairs modified independent.  Baseline: see objective data Goal status: Ongoing  11/29/2022   PLAN:  PT FREQUENCY:  5x/week for 2 weeks, then drop to 2-3x/week for an additional 3 weeks   PT DURATION: 5 weeks   PLANNED INTERVENTIONS: Therapeutic exercises, Therapeutic activity, Neuro Muscular re-education, Balance training, Gait training, Patient/Family education, Joint mobilization, Stair training, DME instructions, Dry Needling, Electrical stimulation, Traction, Cryotherapy, vasopneumatic deviceMoist heat, Taping, Ultrasound, Ionotophoresis 4mg /ml Dexamethasone, and aquatic therapy, Manual therapy.  All included unless contraindicated  PLAN FOR NEXT SESSION: assess ROM.  continue with Progressive balance and strength including weight machines and continued mobility gains.    Vladimir Faster, PT, DPT 12/11/2022, 2:49 PM

## 2022-12-13 ENCOUNTER — Ambulatory Visit (INDEPENDENT_AMBULATORY_CARE_PROVIDER_SITE_OTHER): Payer: Medicare Other | Admitting: Physical Therapy

## 2022-12-13 ENCOUNTER — Encounter: Payer: Self-pay | Admitting: Physical Therapy

## 2022-12-13 DIAGNOSIS — R600 Localized edema: Secondary | ICD-10-CM

## 2022-12-13 DIAGNOSIS — M6281 Muscle weakness (generalized): Secondary | ICD-10-CM | POA: Diagnosis not present

## 2022-12-13 DIAGNOSIS — M25661 Stiffness of right knee, not elsewhere classified: Secondary | ICD-10-CM

## 2022-12-13 DIAGNOSIS — R2689 Other abnormalities of gait and mobility: Secondary | ICD-10-CM

## 2022-12-13 DIAGNOSIS — R2681 Unsteadiness on feet: Secondary | ICD-10-CM

## 2022-12-13 DIAGNOSIS — M25561 Pain in right knee: Secondary | ICD-10-CM | POA: Diagnosis not present

## 2022-12-13 NOTE — Therapy (Signed)
OUTPATIENT PHYSICAL THERAPY  TREATMENT  Patient Name: Jessica [name] MRN: [id number] DOB:[birth year], 72 y.o., female Today's Date: 12/13/2022   END OF SESSION:  PT End of Session - 12/13/22 1348     Visit Number 31    Number of Visits 35    Date for PT Re-Evaluation 12/21/22    Authorization Type [employer] [facility]    Progress Note Due on Visit 33    PT Start Time 1345    PT Stop Time 1439    PT Time Calculation (min) 54 min    Activity Tolerance Patient tolerated treatment well    Behavior During Therapy WFL for tasks assessed/performed                                      Past Medical History:  Diagnosis Date   Anxiety    Arthritis    bilateral knees   Asthma    childhood, inhaler for wheezing PRN   Autonomic neuropathy    diabetic   Cataract    Celiac disease    Constipation    Depression    Diabetes mellitus    type II, Hemoglobin A1C 9.9 10/05/2011   Diabetic neuropathy (HCC)    Diverticulosis    Fatty liver    Gastroparesis    GERD (gastroesophageal reflux disease)    History of claustrophobia    with MRI tests   Hyperlipidemia    Hypertension    IBS (irritable bowel syndrome)    Kidney stones    Personal history of colonic polyps 03/2010   hyperplastic   PONV (postoperative nausea and vomiting)    Shingles 2021   Past Surgical History:  Procedure Laterality Date   APPENDECTOMY     CATARACT EXTRACTION Right 04/29/2018   CATARACT EXTRACTION Left 03/2018   COLONOSCOPY     DILATATION & CURRETTAGE/HYSTEROSCOPY WITH RESECTOCOPE N/A 03/24/2013   Procedure: DILATATION & CURETTAGE, HYSTEROSCOPY WITH RESECTION;  Surgeon: Levi Aland, MD;  Location: [facility];  Service: Gynecology;  Laterality: N/A;   HYSTEROSCOPY WITH D & C N/A 11/08/2015   Procedure: DILATATION AND CURETTAGE /HYSTEROSCOPY;  Surgeon: Levi Aland, MD;  Location: [facility];  Service: Gynecology;  Laterality: N/A;   left breast cyst removal  2000   LEFT HEART CATH  AND CORONARY ANGIOGRAPHY N/A 10/31/2018   Procedure: LEFT HEART CATH AND CORONARY ANGIOGRAPHY;  Surgeon: Corky Crafts, MD;  Location: [facility] INVASIVE CV LAB;  Service: Cardiovascular;  Laterality: N/A;   TOTAL KNEE ARTHROPLASTY Right 09/12/2022   Procedure: RIGHT TOTAL KNEE ARTHROPLASTY;  Surgeon: Kathryne Hitch, MD;  Location: [facility];  Service: Orthopedics;  Laterality: Right;   TUBAL LIGATION     Patient Active Problem List   Diagnosis Date Noted   Status post total right knee replacement 09/12/2022   Degenerative arthritis of left knee 08/07/2022   Chronic rhinitis 04/18/2022   Gastroesophageal reflux disease 04/18/2022   Vitamin D deficiency 04/13/2022   Morbid obesity (HCC) 04/13/2022   Peripheral sensory neuropathy due to type 2 diabetes mellitus (HCC) 04/05/2020   Bilateral lower extremity edema 01/20/2020   Family history of malignant neoplasm of endometrium 10/08/2018   Celiac disease 02/14/2018   Osteopenia 02/14/2018   Mild intermittent asthma 02/14/2018   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) 02/14/2018   Class 2 obesity due to excess calories with body mass index (BMI)  of 37.0 to 37.9 in adult 02/14/2018   Fibroma of tongue 06/06/2016   Renal calculi 01/13/2016   Degenerative cervical disc 07/27/2015   OSA (obstructive sleep apnea) 02/28/2011   Constipation, slow transit 09/23/2010   Type 2 diabetes mellitus with complication, with long-term current use of insulin (HCC) 09/23/2010   FH: colon cancer 09/23/2010   Personal history of colonic polyps 09/23/2010   Combined hyperlipidemia associated with type 2 diabetes mellitus (HCC) 01/29/2009   Diabetic gastroparesis (HCC) 02/27/2007   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 Mardelle Matte, MD   REFERRING PROVIDER: Vanita Panda. Magnus Ivan, MD  REFERRING DIAG: [phone] (ICD-10-CM) - Status  post total right knee replacement   THERAPY DIAG:  Stiffness of right knee, not elsewhere classified  Acute pain of right knee  Muscle weakness (generalized)  Localized edema  Other abnormalities of gait and mobility  Unsteadiness on feet  Rationale for Evaluation and Treatment: Rehabilitation  ONSET DATE: 09/12/2022  SUBJECTIVE:   SUBJECTIVE STATEMENT: Her left leg was hurting more than RLE.    PERTINENT HISTORY: Right TKA 09/12/2022, DM2, asthma, OA, peripheral neuropathy, osteopenia, shingles  PAIN:  NPRS scale: today 2/10 and since last session 0/10 - 2/10 Pain location: Rt knee  Pain description: tightness Aggravating factors: sitting prolonged Relieving factors: Tylenol, ice  PRECAUTIONS: Fall  WEIGHT BEARING RESTRICTIONS: No  FALLS:  Has patient fallen in last 6 months? No  LIVING ENVIRONMENT: Lives with: lives with their spouse Lives in: [place] with master handicap accessible bath & bedroom ramped entrance. Does not go upstairs.  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   PATIENT SURVEYS:  12/06/2022: FOTO update:  52%  10/31/2022: FOTO visit 10 52%  Eval: FOTO intake:  21%  predicted:  47%  COGNITION:10/11/2022 Overall cognitive status: WFL    SENSATION: 10/11/2022 WFL  EDEMA:  10/11/2022 Circumferential:  LLE: above knee 51.5cm,  around knee 50.2 cm, below knee 42.5 cm RLE: above knee 55.5 cm,  around knee 53.8 cm, below knee 46.2 cm  POSTURE: 10/11/2022  rounded shoulders,  forward head, flexed trunk , and weight shift left  PALPATION: 10/11/2022 Right knee tenderness along joint line & incision.   LOWER EXTREMITY ROM:   ROM Right eval Right 10/13/22 Right 10/16/22 Right 10/25/22 Right 10/31/22 Right 11/08/2022 Right 11/16/22 Right 11/17/22 PROM Right 11/20/22 Right 11/23/22 Right 11/28/22 Right 12/06/22 Right 12/13/22  Knee flexion Seated A: 64* P: 74* Active 75 AA 84* Seated P: 87* A: 77* Seated P: 90* A:83* Supine AROM heel slide: 80    Supine and sitting 90*  Supine 87 deg  Sitting 99 deg s but not pain Seated P: 100* AA: 95* Supine A: 96 P: 107 Seated A: 99* P: 111* Seated A: 103* P: 111* Seated A: 104* P: 113*  Knee extension Seated A: LAQ -22* Seated with LE extended P: -11* Active -7 Supine A: -5*  Supine P: -1* A: quad set -3* SAQ -6* Seated AROM LAQ: -  14  Supine quad set AROM -6 Seated with heel 14*, with overpressure 12*  Supine P: -1* A: quad set -4*  Seated  LAQ -9* Standing  Quad set -3* Supine P: -1* Seated  LAQ -9* Standing  Quad set -2* Supine A: quad set -5* P: -2*   (Blank rows = not tested)  LOWER EXTREMITY MMT:  MMT Right 10/11/2022 Right 11/08/2022 Right 11/16/22  Hip flexion     Hip extension     Hip abduction     Hip adduction     Hip internal rotation     Hip external rotation     Knee flexion 3-/5 5/5 in available range   Knee extension 3-/5 4-+/5 in available range 5 in available ROM  Ankle dorsiflexion     Ankle plantarflexion     Ankle inversion     Ankle eversion      (Blank rows = not tested)  FUNCTIONAL TESTS:  12/01/2022:  TUG c SPC: 15.2 seconds,  13.63 seconds   11/08/2022:  TUG 24 seconds with FWW  10/11/2022 18 inch chair transfer: requires BUE support to arise from 22" w/c to RW for stabilization TUG with RW: 41.75 sec  GAIT: 11/27/2022: gait velocity with cane 2.61 ft/sec and without device 2.11 ft/sec  11/08/2022:  Ambulation into clinic c FWW.   10/11/2022 Distance  walked: 20' Assistive device utilized: Environmental consultant - 2 wheeled Level of assistance: SBA Comments: Antalgic gait pattern with decreased stance duration right lower extremity, right knee flexed in stance and minimal increase flexion for swing phase and flexed trunk.                    TODAY's TREATMENT                                               DATE: 12/13/2022 Therapeutic Exercise: Precor bike with BLEs seat 6 rocking for flexion stretch for 2 mins; then backwards full revolutions 2 min and forwards 4 min level 1.   Pt has floor elliptical machine that she showed PT a picture.  PT recommended with demo & verbal using with machine against wall to prevent sliding and sitting in reclined position in chair. Pt verbalized understanding. Leg press in max bending tolerated - double leg 112 lbs x 15 reps; single leg Rt 56 lbs 2 x 15.  Rest in bending for stretch  Knee extension machine BLEs 15# 15 reps;  concentric BLEs & isometric / eccentric RLE 5# 10 reps 2 sets Seated knee flexion machine BLEs 25# 15 reps;  RLE only 10# 10 reps 2 sets.  Manual Therapy: Seated knee flexion/ext PROM with overpressure and contract relax Supine knee ext with overpressure & AA quad set  Vaso right knee elevation 34* medium compression 10 min  TREATMENT                                               DATE: 12/11/2022 Therapeutic Exercise: Precor bike with BLEs seat 6 rocking for flexion stretch for 1.5 mins; then backwards full revolutions 1.5 min and forwards 5 min level 1.  PT advised at [facility] ride 10 min 2 sets with timed rest 5 min decreasing to no rest as tolerated.  Once she can ride 20 min straight, then add 1 min until can ride for 30 min.  Pt verbalized understanding.  Leg press in max bending tolerated - double leg 112 lbs x 15 reps; single leg Rt 56 lbs 2 x 15.  Rest in bending for stretch Slider to 3 cones (ant-lat, lat & post-lat) stance LE flexion on outward motion & extension on inward motion. Ipsilateral UE  support on sink. 5 reps ea LE.   Knee extension machine BLEs 15# 15 reps (20# was too heavy);  concentric BLEs & isometric / eccentric RLE 5# 10 reps 2 sets Seated knee flexion machine BLEs 25# 10 reps;  RLE only 10# 10 reps 2 sets.  Manual Therapy: Seated knee flexion/ext PROM with overpressure and contract relax  Vaso right knee elevation 34* medium compression 10 min   TREATMENT 12/08/22 Pt seen for aquatic therapy today.  Treatment took place in water 3.5-4.75 ft in depth at the [facility] pool. Temp of water was 91.  Pt entered/exited the pool via stairs with hand rail.   Pt requires the buoyancy and hydrostatic pressure of water for support, and to offload joints by unweighting joint load by at least 50 % in navel deep water and by at least 75-80% in chest to neck deep water.  Viscosity of the water is needed for resistance of strengthening. Water current perturbations provides challenge to standing balance requiring increased core activation. Aquatic PT exercises Seated on pool chair and cycling 2 min Seated knee flexion stretch AAROM 5 sec X 15 Standing hamstring curls with UE support X 15 bilat Leg swings hip abd/add X 15 bilat with UE support with UE support Leg swings hip flexion/extension X 15 bilat Sidestepping length of pool 3 round trips  Forward walking  3 round trips backward walking 3 round trips Heel and toe raises 3X12 with UE support  Step ups onto first step of pool with UE support X 15 bilat 1/4 squat with back against wall, shoulder extension X15 with kickboard 1/4 squat with back against wall, push/pull kickboard 2X15  Water PT HEP Exercises emailed to her   TREATMENT                                               DATE: 12/06/2022: Therapeutic Exercise: Precor bike with BLEs seat 6 rocking for flexion stretch for 6 mins; then backwards full revolutions 90 sec and forwards 2 min. Leg press in max bending tolerated - double leg 112 lbs x 15 reps;  single leg Rt 50 lbs 2 x 15.  Rest in bending for stretch Hamstring curl blue theraband 10 reps 2 sets Standing RLE TKE blue theraband 10 reps 2 sets    Manual Therapy: Seated knee flexion/ext PROM with overpressure and contract relax  Vaso right knee elevation 34* medium compression 10 min    HOME EXERCISE PROGRAM: Access Code: [email or web address] URL: [email or web address] Date: 12/04/2022 Prepared by: Vladimir Faster  Exercises - Ankle Alphabet in Elevation  - 2-4 x daily - 7 x weekly - 1 sets - 1 reps - supine quad set with towel roll under ankle  - 2-4 x daily - 7 x weekly - 2 sets - 10 reps - 5 seconds hold - Supine Heel Slide with Strap  - 2-3 x daily - 7 x weekly - 2-3 sets - 10 reps -  5 seconds hold - Seated Knee Flexion Extension AROM   - 2-4 x daily - 7 x weekly - 2-3 sets - 10 reps - 5 seconds hold - Supine Knee Extension Strengthening  - 2-4 x daily - 7 x weekly - 2-3 sets - 10 reps - 5 seconds hold - Seated Long Arc Quad  - 2-4 x daily - 7 x weekly - 2-3 sets - 10 reps - 5 seconds hold - standing calf stretch with forefoot on small step or brick  - 1 x daily - 7 x weekly - 1 sets - 3 reps - 30 seconds hold - Seated Table Hamstring Stretch  - 1 x daily - 7 x weekly - 1 sets - 3 reps - 30 seconds hold - Supine Quadriceps Stretch with Strap on Table  - 1 x daily - 7 x weekly - 1 sets - 3 reps - 30 seconds hold - Standing Tandem Balance with Counter Support  - 1 x daily - 7 x weekly - 1 sets - 2 reps - 30 seconds hold - Heel raises near counter  - 1 x daily - 7 x weekly - 2 sets - 10 reps - 5 seconds hold - Seated Hamstring Curl with Anchored Resistance  - 1 x daily - 4 x weekly - 2-3 sets - 10 reps - 5 seconds hold - standing knee extension  - 1 x daily - 4 x weekly - 2-3 sets - 10 reps - 5 seconds hold  Patient Education - Environmental health practitioner  WATER HEP Access Code: [email or web address] URL: [email or web address] Date: 12/08/2022 Prepared by: Ivery Quale  Exercises - Standing March at [facility]  - 1 x daily - 2 x weekly - 15-20 reps - Standing Hip Flexion Extension at [employer]  - 1 x daily - 2 x weekly - 15-20 reps - Standing Hip Abduction Adduction at Pool Wall  - 1 x daily - 2 x weekly - 15-20 reps - Standing Knee Flexion  - 2 x daily - 6 x weekly - 3 sets - 15-20 reps - Squat  - 1 x daily - 2 x weekly - 15-20 reps - Standing Single Leg Hip Circles  - 1 x daily - 2 x weekly - 1 sets - 15-20 reps - Forward Walking  - 1 x daily - 2 x weekly - 4 sets  ASSESSMENT: CLINICAL IMPRESSION:  patient continues to improve knee range and functional strength with PT activities and instruction in exercises for home & gym.     OBJECTIVE IMPAIRMENTS: Abnormal gait, decreased activity tolerance, decreased balance, decreased endurance, decreased knowledge of condition, decreased knowledge of use of DME, decreased mobility, difficulty walking, decreased ROM, decreased strength, increased edema, postural dysfunction, obesity, and pain.   ACTIVITY LIMITATIONS: carrying, lifting, bending, sitting, standing, squatting, sleeping, stairs, transfers, bed mobility, and locomotion level  PARTICIPATION LIMITATIONS: meal prep, cleaning, driving, community activity, and church  PERSONAL FACTORS: Fitness, Past/current experiences, Time since onset of injury/illness/exacerbation, and 3+ comorbidities: see PMH  are also affecting patient's functional outcome.   REHAB POTENTIAL: Good  CLINICAL DECISION MAKING: Stable/uncomplicated  EVALUATION COMPLEXITY: Low   GOALS: Goals reviewed with patient? Yes  SHORT TERM GOALS: (target date for Short term goals 11/10/2022)   1.  Patient will demonstrate independent use of home exercise program to maintain progress from in clinic treatments. Baseline: see objective data Goal status:  MET 10/31/2022  2. PROM right knee ext -5* and flexion 90* Baseline: see objective data  Goal status: MET 10/31/2022  3. Interim FOTO  >32% Baseline: see objective data  Goal status:  MET 10/31/2022  LONG TERM GOALS: (target dates for all long term goals  12/21/2022 ) (for updated cert s/p manipulation)   1. Patient will demonstrate/report pain at worst less than or equal to 2/10 to facilitate minimal limitation in daily activity secondary to pain symptoms. Baseline: see objective data Goal status: Ongoing 11/29/2022   2. Patient will demonstrate independent use of home exercise program to facilitate ability to maintain/progress functional gains from skilled physical therapy services. Baseline: see objective data Goal status: Ongoing 11/29/2022   3. Patient will demonstrate FOTO outcome > or = 47 % to indicate reduced disability due to condition. Baseline: see objective data Goal status:  MET 11/29/2022   4.  Patient will demonstrate right knee MMT 4/5 throughout to faciltiate usual transfers, stairs, squatting at [facility] for daily life.  Baseline: see objective data Goal status: Ongoing 11/29/2022   5.  Patient right knee AROM ext standing -2* and flexion seated or supine 100* Baseline: see objective data Goal status: Ongoing 11/29/2022   6.  patient amb with LRAD >500' and negotiates ramps/curbs/stairs modified independent.  Baseline: see objective data Goal status: Ongoing  11/29/2022   PLAN:  PT FREQUENCY:  5x/week for 2 weeks, then drop to 2-3x/week for an additional 3 weeks   PT DURATION: 5 weeks   PLANNED INTERVENTIONS: Therapeutic exercises, Therapeutic activity, Neuro Muscular re-education, Balance training, Gait training, Patient/Family education, Joint mobilization, Stair training, DME instructions, Dry Needling, Electrical stimulation, Traction, Cryotherapy, vasopneumatic deviceMoist heat, Taping, Ultrasound, Ionotophoresis 4mg /ml Dexamethasone, and aquatic therapy, Manual therapy.  All included unless contraindicated  PLAN FOR NEXT SESSION:  continue with Progressive balance and strength including weight  machines and continued mobility gains.   Vladimir Faster, PT, DPT 12/13/2022, 2:28 PM

## 2022-12-15 ENCOUNTER — Encounter: Payer: Self-pay | Admitting: Rehabilitative and Restorative Service Providers"

## 2022-12-15 ENCOUNTER — Ambulatory Visit (INDEPENDENT_AMBULATORY_CARE_PROVIDER_SITE_OTHER): Payer: Medicare Other | Admitting: Rehabilitative and Restorative Service Providers"

## 2022-12-15 DIAGNOSIS — M6281 Muscle weakness (generalized): Secondary | ICD-10-CM

## 2022-12-15 DIAGNOSIS — M25661 Stiffness of right knee, not elsewhere classified: Secondary | ICD-10-CM | POA: Diagnosis not present

## 2022-12-15 DIAGNOSIS — M25561 Pain in right knee: Secondary | ICD-10-CM

## 2022-12-15 DIAGNOSIS — R2689 Other abnormalities of gait and mobility: Secondary | ICD-10-CM

## 2022-12-15 DIAGNOSIS — R600 Localized edema: Secondary | ICD-10-CM

## 2022-12-15 DIAGNOSIS — R2681 Unsteadiness on feet: Secondary | ICD-10-CM

## 2022-12-15 NOTE — Therapy (Signed)
OUTPATIENT PHYSICAL THERAPY  TREATMENT  Patient Name: Jessica [name] MRN: [id number] DOB:10-01-1950, 72 y.o., female Today's Date: 12/15/2022   END OF SESSION:  PT End of Session - 12/15/22 1300     Visit Number 32    Number of Visits 35    Date for PT Re-Evaluation 12/21/22    Authorization Type [employer] [facility]    Progress Note Due on Visit 33    PT Start Time 1259    PT Stop Time 1348    PT Time Calculation (min) 49 min    Activity Tolerance Patient tolerated treatment well    Behavior During Therapy WFL for tasks assessed/performed                                       Past Medical History:  Diagnosis Date   Anxiety    Arthritis    bilateral knees   Asthma    childhood, inhaler for wheezing PRN   Autonomic neuropathy    diabetic   Cataract    Celiac disease    Constipation    Depression    Diabetes mellitus    type II, Hemoglobin A1C 9.9 10/05/2011   Diabetic neuropathy (HCC)    Diverticulosis    Fatty liver    Gastroparesis    GERD (gastroesophageal reflux disease)    History of claustrophobia    with MRI tests   Hyperlipidemia    Hypertension    IBS (irritable bowel syndrome)    Kidney stones    Personal history of colonic polyps 03/2010   hyperplastic   PONV (postoperative nausea and vomiting)    Shingles 2021   Past Surgical History:  Procedure Laterality Date   APPENDECTOMY     CATARACT EXTRACTION Right 04/29/2018   CATARACT EXTRACTION Left 03/2018   COLONOSCOPY     DILATATION & CURRETTAGE/HYSTEROSCOPY WITH RESECTOCOPE N/A 03/24/2013   Procedure: DILATATION & CURETTAGE, HYSTEROSCOPY WITH RESECTION;  Surgeon: Levi Aland, MD;  Location: [facility];  Service: Gynecology;  Laterality: N/A;   HYSTEROSCOPY WITH D & C N/A 11/08/2015   Procedure: DILATATION AND CURETTAGE /HYSTEROSCOPY;  Surgeon: Levi Aland, MD;  Location: [facility];  Service: Gynecology;  Laterality: N/A;   left breast cyst removal  2000   LEFT HEART CATH  AND CORONARY ANGIOGRAPHY N/A 10/31/2018   Procedure: LEFT HEART CATH AND CORONARY ANGIOGRAPHY;  Surgeon: Corky Crafts, MD;  Location: [facility] INVASIVE CV LAB;  Service: Cardiovascular;  Laterality: N/A;   TOTAL KNEE ARTHROPLASTY Right 09/12/2022   Procedure: RIGHT TOTAL KNEE ARTHROPLASTY;  Surgeon: Kathryne Hitch, MD;  Location: [facility];  Service: Orthopedics;  Laterality: Right;   TUBAL LIGATION     Patient Active Problem List   Diagnosis Date Noted   Status post total right knee replacement 09/12/2022   Degenerative arthritis of left knee 08/07/2022   Chronic rhinitis 04/18/2022   Gastroesophageal reflux disease 04/18/2022   Vitamin D deficiency 04/13/2022   Morbid obesity (HCC) 04/13/2022   Peripheral sensory neuropathy due to type 2 diabetes mellitus (HCC) 04/05/2020   Bilateral lower extremity edema 01/20/2020   Family history of malignant neoplasm of endometrium 10/08/2018   Celiac disease 02/14/2018   Osteopenia 02/14/2018   Mild intermittent asthma 02/14/2018   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) 02/14/2018   Class 2 obesity due to excess calories with body mass index (  BMI) of 37.0 to 37.9 in adult 02/14/2018   Fibroma of tongue 06/06/2016   Renal calculi 01/13/2016   Degenerative cervical disc 07/27/2015   OSA (obstructive sleep apnea) 02/28/2011   Constipation, slow transit 09/23/2010   Type 2 diabetes mellitus with complication, with long-term current use of insulin (HCC) 09/23/2010   FH: colon cancer 09/23/2010   Personal history of colonic polyps 09/23/2010   Combined hyperlipidemia associated with type 2 diabetes mellitus (HCC) 01/29/2009   Diabetic gastroparesis (HCC) 02/27/2007   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 Mardelle Matte, MD   REFERRING PROVIDER: Vanita Panda. Magnus Ivan, MD  REFERRING DIAG: [phone] (ICD-10-CM) - Status  post total right knee replacement   THERAPY DIAG:  Stiffness of right knee, not elsewhere classified  Acute pain of right knee  Muscle weakness (generalized)  Localized edema  Other abnormalities of gait and mobility  Unsteadiness on feet  Rationale for Evaluation and Treatment: Rehabilitation  ONSET DATE: 09/12/2022  SUBJECTIVE:   SUBJECTIVE STATEMENT: Pt indicated 3/10 tightness complaints with sitting primarily.  Walking was ok.   PERTINENT HISTORY: Right TKA 09/12/2022, DM2, asthma, OA, peripheral neuropathy, osteopenia, shingles  PAIN:  NPRS scale: 3/10 at worst  Pain location: Rt knee  Pain description: tightness Aggravating factors: sitting prolonged Relieving factors: Tylenol, ice  PRECAUTIONS: Fall  WEIGHT BEARING RESTRICTIONS: No  FALLS:  Has patient fallen in last 6 months? No  LIVING ENVIRONMENT: Lives with: lives with their spouse Lives in: [place] with master handicap accessible bath & bedroom ramped entrance. Does not go upstairs.  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   PATIENT SURVEYS:  12/06/2022: FOTO update:  52%  10/31/2022: FOTO visit 10 52%  Eval: FOTO intake:  21%  predicted:  47%  COGNITION:10/11/2022 Overall cognitive status: WFL    SENSATION: 10/11/2022 WFL  EDEMA:  10/11/2022 Circumferential:  LLE: above knee 51.5cm,  around knee 50.2 cm, below knee 42.5 cm RLE: above knee 55.5 cm,  around knee 53.8 cm, below knee 46.2 cm  POSTURE: 10/11/2022  rounded shoulders, forward head,  flexed trunk , and weight shift left  PALPATION: 10/11/2022 Right knee tenderness along joint line & incision.   LOWER EXTREMITY ROM:   ROM Right eval Right 10/13/22 Right 10/16/22 Right 10/25/22 Right 10/31/22 Right 11/08/2022 Right 11/16/22 Right 11/17/22 PROM Right 11/20/22 Right 11/23/22 Right 11/28/22 Right 12/06/22 Right 12/13/22  Knee flexion Seated A: 64* P: 74* Active 75 AA 84* Seated P: 87* A: 77* Seated P: 90* A:83* Supine AROM heel slide: 80    Supine and sitting 90*  Supine 87 deg  Sitting 99 deg s but not pain Seated P: 100* AA: 95* Supine A: 96 P: 107 Seated A: 99* P: 111* Seated A: 103* P: 111* Seated A: 104* P: 113*  Knee extension Seated A: LAQ -22* Seated with LE extended P: -11* Active -7 Supine A: -5*  Supine P: -1* A: quad set -3* SAQ -6* Seated AROM LAQ: -14  Supine quad set  AROM -6 Seated with heel 14*, with overpressure 12*  Supine P: -1* A: quad set -4*  Seated  LAQ -9* Standing  Quad set -3* Supine P: -1* Seated  LAQ -9* Standing  Quad set -2* Supine A: quad set -5* P: -2*   (Blank rows = not tested)  LOWER EXTREMITY MMT:  MMT Right 10/11/2022 Right 11/08/2022 Right 11/16/22  Hip flexion     Hip extension     Hip abduction     Hip adduction     Hip internal rotation     Hip external rotation     Knee flexion 3-/5 5/5 in available range   Knee extension 3-/5 4-+/5 in available range 5 in available ROM  Ankle dorsiflexion     Ankle plantarflexion     Ankle inversion     Ankle eversion      (Blank rows = not tested)  FUNCTIONAL TESTS:  12/01/2022:  TUG c SPC: 15.2 seconds,  13.63 seconds   11/08/2022:  TUG 24 seconds with FWW  10/11/2022 18 inch chair transfer: requires BUE support to arise from 22" w/c to RW for stabilization TUG with RW: 41.75 sec  GAIT: 11/27/2022: gait velocity with cane 2.61 ft/sec and without device 2.11 ft/sec  11/08/2022:  Ambulation into clinic c FWW.   10/11/2022 Distance walked:  20' Assistive device utilized: Environmental consultant - 2 wheeled Level of assistance: SBA Comments: Antalgic gait pattern with decreased stance duration right lower extremity, right knee flexed in stance and minimal increase flexion for swing phase and flexed trunk.                     TODAY's TREATMENT                                               DATE: 12/15/2022 Therapeutic Exercise: UBE LE lvl 2.5 10 mins, seat 10  Step on over and down WB on Rt leg with single hand on bar 6 inch step x 2, 4 inch step x 15  Leg press double leg 112 lbs x 15, single leg  Additional time spent in review of gym based activity, encouragement to improve confidence in use.  Suggested and encouraged visit to [facility] prior to next visit if possible to allow a trial and question and answer about use.   Also talked though process of aquatics.  Pt had concerns about safety in water by her self.  Will see Arlys John again and assess her safety level for any use.   Neuro Re-ed Tandem stance on foam 1 min x 1 bilateral Standing marching on foam 2 x 10 bilateral with SBA   TODAY's TREATMENT                                               DATE: 12/13/2022 Therapeutic Exercise: Precor bike with BLEs seat 6 rocking for flexion stretch for 2 mins; then backwards full revolutions 2 min and forwards 4 min level 1.   Pt has floor elliptical machine that she showed PT a picture.  PT recommended with demo & verbal using with machine against wall to prevent sliding and sitting in reclined position in chair. Pt verbalized understanding. Leg press in max bending tolerated -  double leg 112 lbs x 15 reps; single leg Rt 56 lbs 2 x 15.  Rest in bending for stretch  Knee extension machine BLEs 15# 15 reps;  concentric BLEs & isometric / eccentric RLE 5# 10 reps 2 sets Seated knee flexion machine BLEs 25# 15 reps;  RLE only 10# 10 reps 2 sets.  Manual Therapy: Seated knee flexion/ext PROM with overpressure and contract relax Supine knee ext with overpressure  & AA quad set  Vaso right knee elevation 34* medium compression 10 min  TREATMENT                                               DATE: 12/11/2022 Therapeutic Exercise: Precor bike with BLEs seat 6 rocking for flexion stretch for 1.5 mins; then backwards full revolutions 1.5 min and forwards 5 min level 1.  PT advised at [facility] ride 10 min 2 sets with timed rest 5 min decreasing to no rest as tolerated. Once she can ride 20 min straight, then add 1 min until can ride for 30 min.  Pt verbalized understanding.  Leg press in max bending tolerated - double leg 112 lbs x 15 reps; single leg Rt 56 lbs 2 x 15.  Rest in bending for stretch Slider to 3 cones (ant-lat, lat & post-lat) stance LE flexion on outward motion & extension on inward motion. Ipsilateral UE support on sink. 5 reps ea LE.   Knee extension machine BLEs 15# 15 reps (20# was too heavy);  concentric BLEs & isometric / eccentric RLE 5# 10 reps 2 sets Seated knee flexion machine BLEs 25# 10 reps;  RLE only 10# 10 reps 2 sets.  Manual Therapy: Seated knee flexion/ext PROM with overpressure and contract relax  Vaso right knee elevation 34* medium compression 10 min   TREATMENT 12/08/22 Pt seen for aquatic therapy today.  Treatment took place in water 3.5-4.75 ft in depth at the [facility] pool. Temp of water was 91.  Pt entered/exited the pool via stairs with hand rail.   Pt requires the buoyancy and hydrostatic pressure of water for support, and to offload joints by unweighting joint load by at least 50 % in navel deep water and by at least 75-80% in chest to neck deep water.  Viscosity of the water is needed for resistance of strengthening. Water current perturbations provides challenge to standing balance requiring increased core activation. Aquatic PT exercises Seated on pool chair and cycling 2 min Seated knee flexion stretch AAROM 5 sec X 15 Standing hamstring curls with UE support X 15 bilat Leg swings hip abd/add X 15  bilat with UE support with UE support Leg swings hip flexion/extension X 15 bilat Sidestepping length of pool 3 round trips  Forward walking  3 round trips backward walking 3 round trips Heel and toe raises 3X12 with UE support  Step ups onto first step of pool with UE support X 15 bilat 1/4 squat with back against wall, shoulder extension X15 with kickboard 1/4 squat with back against wall, push/pull kickboard 2X15  Water PT HEP Exercises emailed to her    HOME EXERCISE PROGRAM: Access Code: [email or web address] URL: [email or web address] Date: 12/04/2022 Prepared by: Vladimir Faster  Exercises - Ankle Alphabet in Elevation  - 2-4 x daily - 7 x weekly - 1 sets - 1 reps - supine quad set with towel roll under  ankle  - 2-4 x daily - 7 x weekly - 2 sets - 10 reps - 5 seconds hold - Supine Heel Slide with Strap  - 2-3 x daily - 7 x weekly - 2-3 sets - 10 reps - 5 seconds hold - Seated Knee Flexion Extension AROM   - 2-4 x daily - 7 x weekly - 2-3 sets - 10 reps - 5 seconds hold - Supine Knee Extension Strengthening  - 2-4 x daily - 7 x weekly - 2-3 sets - 10 reps - 5 seconds hold - Seated Long Arc Quad  - 2-4 x daily - 7 x weekly - 2-3 sets - 10 reps - 5 seconds hold - standing calf stretch with forefoot on small step or brick  - 1 x daily - 7 x weekly - 1 sets - 3 reps - 30 seconds hold - Seated Table Hamstring Stretch  - 1 x daily - 7 x weekly - 1 sets - 3 reps - 30 seconds hold - Supine Quadriceps Stretch with Strap on Table  - 1 x daily - 7 x weekly - 1 sets - 3 reps - 30 seconds hold - Standing Tandem Balance with Counter Support  - 1 x daily - 7 x weekly - 1 sets - 2 reps - 30 seconds hold - Heel raises near counter  - 1 x daily - 7 x weekly - 2 sets - 10 reps - 5 seconds hold - Seated Hamstring Curl with Anchored Resistance  - 1 x daily - 4 x weekly - 2-3 sets - 10 reps - 5 seconds hold - standing knee extension  - 1 x daily - 4 x weekly - 2-3 sets - 10 reps - 5 seconds  hold  Patient Education - Environmental health practitioner  WATER HEP Access Code: [email or web address] URL: [email or web address] Date: 12/08/2022 Prepared by: Ivery Quale  Exercises - Standing March at [employer]  - 1 x daily - 2 x weekly - 15-20 reps - Standing Hip Flexion Extension at [employer]  - 1 x daily - 2 x weekly - 15-20 reps - Standing Hip Abduction Adduction at Pool Wall  - 1 x daily - 2 x weekly - 15-20 reps - Standing Knee Flexion  - 2 x daily - 6 x weekly - 3 sets - 15-20 reps - Squat  - 1 x daily - 2 x weekly - 15-20 reps - Standing Single Leg Hip Circles  - 1 x daily - 2 x weekly - 1 sets - 15-20 reps - Forward Walking  - 1 x daily - 2 x weekly - 4 sets  ASSESSMENT: CLINICAL IMPRESSION:  Pt has continued to report reduced severity of symptoms and improvements in functional activity tolerance.  Continued encouragement and support for home program and [employer] usage to promote continued self efficacy in activity and ultimate long term plan for HEP.   After discussion today, Pt elected to leave schedule for 2x in clinic and 1 aquatic next week with possibility of reducing in clinic visits depending on [employer] trial.    OBJECTIVE IMPAIRMENTS: Abnormal gait, decreased activity tolerance, decreased balance, decreased endurance, decreased knowledge of condition, decreased knowledge of use of DME, decreased mobility, difficulty walking, decreased ROM, decreased strength, increased edema, postural dysfunction, obesity, and pain.   ACTIVITY LIMITATIONS: carrying, lifting, bending, sitting, standing, squatting, sleeping, stairs, transfers, bed mobility, and locomotion level  PARTICIPATION LIMITATIONS: meal prep, cleaning, driving, community activity, and church  PERSONAL FACTORS: Fitness, Past/current experiences, Time since  onset of injury/illness/exacerbation, and 3+ comorbidities: see PMH  are also affecting patient's functional outcome.   REHAB POTENTIAL: Good  CLINICAL DECISION MAKING:  Stable/uncomplicated  EVALUATION COMPLEXITY: Low   GOALS: Goals reviewed with patient? Yes  SHORT TERM GOALS: (target date for Short term goals 11/10/2022)   1.  Patient will demonstrate independent use of home exercise program to maintain progress from in clinic treatments. Baseline: see objective data Goal status:  MET 10/31/2022  2. PROM right knee ext -5* and flexion 90* Baseline: see objective data Goal status: MET 10/31/2022  3. Interim FOTO >32% Baseline: see objective data  Goal status:  MET 10/31/2022  LONG TERM GOALS: (target dates for all long term goals  12/21/2022 ) (for updated cert s/p manipulation)   1. Patient will demonstrate/report pain at worst less than or equal to 2/10 to facilitate minimal limitation in daily activity secondary to pain symptoms. Baseline: see objective data Goal status: Ongoing 11/29/2022   2. Patient will demonstrate independent use of home exercise program to facilitate ability to maintain/progress functional gains from skilled physical therapy services. Baseline: see objective data Goal status: Ongoing 11/29/2022   3. Patient will demonstrate FOTO outcome > or = 47 % to indicate reduced disability due to condition. Baseline: see objective data Goal status:  MET 11/29/2022   4.  Patient will demonstrate right knee MMT 4/5 throughout to faciltiate usual transfers, stairs, squatting at [facility] for daily life.  Baseline: see objective data Goal status: Ongoing 11/29/2022   5.  Patient right knee AROM ext standing -2* and flexion seated or supine 100* Baseline: see objective data Goal status: Ongoing 11/29/2022   6.  patient amb with LRAD >500' and negotiates ramps/curbs/stairs modified independent.  Baseline: see objective data Goal status: Ongoing  11/29/2022   PLAN:  PT FREQUENCY:  5x/week for 2 weeks, then drop to 2-3x/week for an additional 3 weeks   PT DURATION: 5 weeks   PLANNED INTERVENTIONS: Therapeutic exercises, Therapeutic  activity, Neuro Muscular re-education, Balance training, Gait training, Patient/Family education, Joint mobilization, Stair training, DME instructions, Dry Needling, Electrical stimulation, Traction, Cryotherapy, vasopneumatic deviceMoist heat, Taping, Ultrasound, Ionotophoresis 4mg /ml Dexamethasone, and aquatic therapy, Manual therapy.  All included unless contraindicated  PLAN FOR NEXT SESSION:  Possible HEP /[employer] transitioning pending Pt comfort level .  Will do aquatic on Friday.   Chyrel Masson, PT, DPT, OCS, ATC 12/15/22  1:50 PM

## 2022-12-18 ENCOUNTER — Ambulatory Visit (INDEPENDENT_AMBULATORY_CARE_PROVIDER_SITE_OTHER): Payer: Medicare Other | Admitting: Physical Therapy

## 2022-12-18 ENCOUNTER — Encounter: Payer: Self-pay | Admitting: Physical Therapy

## 2022-12-18 DIAGNOSIS — M25661 Stiffness of right knee, not elsewhere classified: Secondary | ICD-10-CM | POA: Diagnosis not present

## 2022-12-18 DIAGNOSIS — R600 Localized edema: Secondary | ICD-10-CM

## 2022-12-18 DIAGNOSIS — M25561 Pain in right knee: Secondary | ICD-10-CM | POA: Diagnosis not present

## 2022-12-18 DIAGNOSIS — R2689 Other abnormalities of gait and mobility: Secondary | ICD-10-CM

## 2022-12-18 DIAGNOSIS — M6281 Muscle weakness (generalized): Secondary | ICD-10-CM | POA: Diagnosis not present

## 2022-12-18 DIAGNOSIS — R2681 Unsteadiness on feet: Secondary | ICD-10-CM

## 2022-12-18 NOTE — Therapy (Signed)
OUTPATIENT PHYSICAL THERAPY  TREATMENT  Patient Name: Jessica [name] MRN: [id number] DOB:Aug 08, 1950, 72 y.o., female Today's Date: 12/18/2022   END OF SESSION:  PT End of Session - 12/18/22 1343     Visit Number 33    Number of Visits 35    Date for PT Re-Evaluation 12/21/22    Authorization Type [employer] [facility]    Progress Note Due on Visit 33    PT Start Time 1343    PT Stop Time 1438    PT Time Calculation (min) 55 min    Activity Tolerance Patient tolerated treatment well    Behavior During Therapy WFL for tasks assessed/performed                                        Past Medical History:  Diagnosis Date   Anxiety    Arthritis    bilateral knees   Asthma    childhood, inhaler for wheezing PRN   Autonomic neuropathy    diabetic   Cataract    Celiac disease    Constipation    Depression    Diabetes mellitus    type II, Hemoglobin A1C 9.9 10/05/2011   Diabetic neuropathy (HCC)    Diverticulosis    Fatty liver    Gastroparesis    GERD (gastroesophageal reflux disease)    History of claustrophobia    with MRI tests   Hyperlipidemia    Hypertension    IBS (irritable bowel syndrome)    Kidney stones    Personal history of colonic polyps 03/2010   hyperplastic   PONV (postoperative nausea and vomiting)    Shingles 2021   Past Surgical History:  Procedure Laterality Date   APPENDECTOMY     CATARACT EXTRACTION Right 04/29/2018   CATARACT EXTRACTION Left 03/2018   COLONOSCOPY     DILATATION & CURRETTAGE/HYSTEROSCOPY WITH RESECTOCOPE N/A 03/24/2013   Procedure: DILATATION & CURETTAGE, HYSTEROSCOPY WITH RESECTION;  Surgeon: Levi Aland, MD;  Location: [facility];  Service: Gynecology;  Laterality: N/A;   HYSTEROSCOPY WITH D & C N/A 11/08/2015   Procedure: DILATATION AND CURETTAGE /HYSTEROSCOPY;  Surgeon: Levi Aland, MD;  Location: [facility];  Service: Gynecology;  Laterality: N/A;   left breast cyst removal  2000   LEFT HEART  CATH AND CORONARY ANGIOGRAPHY N/A 10/31/2018   Procedure: LEFT HEART CATH AND CORONARY ANGIOGRAPHY;  Surgeon: Corky Crafts, MD;  Location: [facility];  Service: Cardiovascular;  Laterality: N/A;   TOTAL KNEE ARTHROPLASTY Right 09/12/2022   Procedure: RIGHT TOTAL KNEE ARTHROPLASTY;  Surgeon: Kathryne Hitch, MD;  Location: [facility];  Service: Orthopedics;  Laterality: Right;   TUBAL LIGATION     Patient Active Problem List   Diagnosis Date Noted   Status post total right knee replacement 09/12/2022   Degenerative arthritis of left knee 08/07/2022   Chronic rhinitis 04/18/2022   Gastroesophageal reflux disease 04/18/2022   Vitamin D deficiency 04/13/2022   Morbid obesity (HCC) 04/13/2022   Peripheral sensory neuropathy due to type 2 diabetes mellitus (HCC) 04/05/2020   Bilateral lower extremity edema 01/20/2020   Family history of malignant neoplasm of endometrium 10/08/2018   Celiac disease 02/14/2018   Osteopenia 02/14/2018   Mild intermittent asthma 02/14/2018   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) 02/14/2018   Class 2 obesity due to excess calories with body mass  index (BMI) of 37.0 to 37.9 in adult 02/14/2018   Fibroma of tongue 06/06/2016   Renal calculi 01/13/2016   Degenerative cervical disc 07/27/2015   OSA (obstructive sleep apnea) 02/28/2011   Constipation, slow transit 09/23/2010   Type 2 diabetes mellitus with complication, with long-term current use of insulin (HCC) 09/23/2010   FH: colon cancer 09/23/2010   Personal history of colonic polyps 09/23/2010   Combined hyperlipidemia associated with type 2 diabetes mellitus (HCC) 01/29/2009   Diabetic gastroparesis (HCC) 02/27/2007   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 Mardelle Matte, MD   REFERRING PROVIDER: Vanita Panda. Magnus Ivan, MD  REFERRING DIAG: [phone] (ICD-10-CM) -  Status post total right knee replacement   THERAPY DIAG:  Stiffness of right knee, not elsewhere classified  Acute pain of right knee  Muscle weakness (generalized)  Localized edema  Other abnormalities of gait and mobility  Unsteadiness on feet  Rationale for Evaluation and Treatment: Rehabilitation  ONSET DATE: 09/12/2022  SUBJECTIVE:   SUBJECTIVE STATEMENT: She did the floor elliptical for 10 min first time and 20 min second time.  She moved chair closer to bend knee more as PT advised.     PERTINENT HISTORY: Right TKA 09/12/2022, DM2, asthma, OA, peripheral neuropathy, osteopenia, shingles  PAIN:  NPRS scale: today 01/10 and 1-2/10 at worst but 10/10 Friday night (but decreased after  Pain location: Rt knee  Pain description: tightness Aggravating factors: sitting prolonged Relieving factors: Tylenol, ice  PRECAUTIONS: Fall  WEIGHT BEARING RESTRICTIONS: No  FALLS:  Has patient fallen in last 6 months? No  LIVING ENVIRONMENT: Lives with: lives with their spouse Lives in: [place] with master handicap accessible bath & bedroom ramped entrance. Does not go upstairs.  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   PATIENT SURVEYS:  12/06/2022: FOTO update:  52%  10/31/2022: FOTO visit 10 52%  Eval: FOTO intake:  21%  predicted:  47%  COGNITION:10/11/2022 Overall cognitive status: WFL    SENSATION: 10/11/2022 WFL  EDEMA:  10/11/2022 Circumferential:  LLE: above knee 51.5cm,  around knee 50.2 cm, below knee 42.5 cm RLE:  above knee 55.5 cm,  around knee 53.8 cm, below knee 46.2 cm  POSTURE: 10/11/2022  rounded shoulders, forward head, flexed trunk , and weight shift left  PALPATION: 10/11/2022 Right knee tenderness along joint line & incision.   LOWER EXTREMITY ROM:   ROM Right eval Right 10/13/22 Right 10/16/22 Right 10/25/22 Right 10/31/22 Right 11/08/2022 Right 11/16/22 Right 11/17/22 PROM Right 11/20/22 Right 11/23/22 Right 11/28/22 Right 12/06/22 Right 12/13/22  Knee flexion Seated A: 64* P: 74* Active 75 AA 84* Seated P: 87* A: 77* Seated P: 90* A:83* Supine AROM heel slide: 80    Supine and sitting 90*  Supine 87 deg  Sitting 99 deg s but not pain Seated P: 100* AA: 95* Supine A: 96 P: 107 Seated A: 99* P: 111* Seated A: 103* P: 111* Seated A: 104* P: 113*  Knee extension Seated A: LAQ -22* Seated  with LE extended P: -11* Active -7 Supine A: -5*  Supine P: -1* A: quad set -3* SAQ -6* Seated AROM LAQ: -14  Supine quad set AROM -6 Seated with heel 14*, with overpressure 12*  Supine P: -1* A: quad set -4*  Seated  LAQ -9* Standing  Quad set -3* Supine P: -1* Seated  LAQ -9* Standing  Quad set -2* Supine A: quad set -5* P: -2*   (Blank rows = not tested)  LOWER EXTREMITY MMT:  MMT Right 10/11/2022 Right 11/08/2022 Right 11/16/22  Hip flexion     Hip extension     Hip abduction     Hip adduction     Hip internal rotation     Hip external rotation     Knee flexion 3-/5 5/5 in available range   Knee extension 3-/5 4-+/5 in available range 5 in available ROM  Ankle dorsiflexion     Ankle plantarflexion     Ankle inversion     Ankle eversion      (Blank rows = not tested)  FUNCTIONAL TESTS:  12/01/2022:  TUG c SPC: 15.2 seconds,  13.63 seconds   11/08/2022:  TUG 24 seconds with FWW  10/11/2022 18 inch chair transfer: requires BUE support to arise from 22" w/c to RW for stabilization TUG with RW: 41.75 sec  GAIT: 11/27/2022: gait velocity with cane 2.61  ft/sec and without device 2.11 ft/sec  11/08/2022:  Ambulation into clinic c FWW.   10/11/2022 Distance walked: 20' Assistive device utilized: Environmental consultant - 2 wheeled Level of assistance: SBA Comments: Antalgic gait pattern with decreased stance duration right lower extremity, right knee flexed in stance and minimal increase flexion for swing phase and flexed trunk.                     TODAY's TREATMENT                                               DATE: 12/18/2022: Therapeutic Exercise: Gastroc stretch step heel depression 30 sec hold 3 reps BLEs. Precor bike with BLEs seat 6 rocking for flexion stretch for 3 mins; then backwards full revolutions 1 min and forwards 4 min level 1.   Seated knee ext / quad set 5 sec and active knee flexion with end range AA 5 sec hold 10 reps.  And seated alphabet.  Both exercises are good way to minimize "stiffness" with prolonged sitting. Squat to 18" chair with BUE sink 10 reps with demo & verbal cues on knee flexion.  Leg press in max bending tolerated - double leg 112 lbs x 15 reps; single leg Rt 56 lbs 2 x 15.  Rest in bending for stretch  Knee extension machine BLEs 15# 15 reps;  concentric BLEs & isometric / eccentric RLE 5# 10 reps 2 sets Seated knee flexion machine BLEs 25# 15 reps;  RLE only 15# 10 reps 2 sets.   Vaso right knee elevation 34* medium compression 10 min    TREATMENT                                               DATE: 12/15/2022 Therapeutic Exercise: UBE LE lvl 2.5 10 mins, seat 10  Step  on over and down WB on Rt leg with single hand on bar 6 inch step x 2, 4 inch step x 15  Leg press double leg 112 lbs x 15, single leg  Additional time spent in review of gym based activity, encouragement to improve confidence in use.  Suggested and encouraged visit to [facility] prior to next visit if possible to allow a trial and question and answer about use.   Also talked though process of aquatics.  Pt had concerns about safety in water by her self.   Will see Arlys John again and assess her safety level for any use.   Neuro Re-ed Tandem stance on foam 1 min x 1 bilateral Standing marching on foam 2 x 10 bilateral with SBA   TREATMENT                                               DATE: 12/13/2022 Therapeutic Exercise: Precor bike with BLEs seat 6 rocking for flexion stretch for 2 mins; then backwards full revolutions 2 min and forwards 4 min level 1.   Pt has floor elliptical machine that she showed PT a picture.  PT recommended with demo & verbal using with machine against wall to prevent sliding and sitting in reclined position in chair. Pt verbalized understanding. Leg press in max bending tolerated - double leg 112 lbs x 15 reps; single leg Rt 56 lbs 2 x 15.  Rest in bending for stretch  Knee extension machine BLEs 15# 15 reps;  concentric BLEs & isometric / eccentric RLE 5# 10 reps 2 sets Seated knee flexion machine BLEs 25# 15 reps;  RLE only 10# 10 reps 2 sets.  Manual Therapy: Seated knee flexion/ext PROM with overpressure and contract relax Supine knee ext with overpressure & AA quad set  Vaso right knee elevation 34* medium compression 10 min      HOME EXERCISE PROGRAM: Access Code: [email or web address] URL: [email or web address] Date: 12/04/2022 Prepared by: Vladimir Faster  Exercises - Ankle Alphabet in Elevation  - 2-4 x daily - 7 x weekly - 1 sets - 1 reps - supine quad set with towel roll under ankle  - 2-4 x daily - 7 x weekly - 2 sets - 10 reps - 5 seconds hold - Supine Heel Slide with Strap  - 2-3 x daily - 7 x weekly - 2-3 sets - 10 reps - 5 seconds hold - Seated Knee Flexion Extension AROM   - 2-4 x daily - 7 x weekly - 2-3 sets - 10 reps - 5 seconds hold - Supine Knee Extension Strengthening  - 2-4 x daily - 7 x weekly - 2-3 sets - 10 reps - 5 seconds hold - Seated Long Arc Quad  - 2-4 x daily - 7 x weekly - 2-3 sets - 10 reps - 5 seconds hold - standing calf stretch with forefoot on small step or brick  - 1 x  daily - 7 x weekly - 1 sets - 3 reps - 30 seconds hold - Seated Table Hamstring Stretch  - 1 x daily - 7 x weekly - 1 sets - 3 reps - 30 seconds hold - Supine Quadriceps Stretch with Strap on Table  - 1 x daily - 7 x weekly - 1 sets - 3 reps - 30 seconds hold - Standing Tandem Balance with Counter Support  -  1 x daily - 7 x weekly - 1 sets - 2 reps - 30 seconds hold - Heel raises near counter  - 1 x daily - 7 x weekly - 2 sets - 10 reps - 5 seconds hold - Seated Hamstring Curl with Anchored Resistance  - 1 x daily - 4 x weekly - 2-3 sets - 10 reps - 5 seconds hold - standing knee extension  - 1 x daily - 4 x weekly - 2-3 sets - 10 reps - 5 seconds hold  Patient Education - Environmental health practitioner  WATER HEP Access Code: [email or web address] URL: [email or web address] Date: 12/08/2022 Prepared by: Ivery Quale  Exercises - Standing March at [facility]  - 1 x daily - 2 x weekly - 15-20 reps - Standing Hip Flexion Extension at [employer]  - 1 x daily - 2 x weekly - 15-20 reps - Standing Hip Abduction Adduction at [place]  - 1 x daily - 2 x weekly - 15-20 reps - Standing Knee Flexion  - 2 x daily - 6 x weekly - 3 sets - 15-20 reps - Squat  - 1 x daily - 2 x weekly - 15-20 reps - Standing Single Leg Hip Circles  - 1 x daily - 2 x weekly - 1 sets - 15-20 reps - Forward Walking  - 1 x daily - 2 x weekly - 4 sets  ASSESSMENT: CLINICAL IMPRESSION:  PT addressed exercises to minimize stiffness during and after prolonged sitting which she appears to understand.     OBJECTIVE IMPAIRMENTS: Abnormal gait, decreased activity tolerance, decreased balance, decreased endurance, decreased knowledge of condition, decreased knowledge of use of DME, decreased mobility, difficulty walking, decreased ROM, decreased strength, increased edema, postural dysfunction, obesity, and pain.   ACTIVITY LIMITATIONS: carrying, lifting, bending, sitting, standing, squatting, sleeping, stairs, transfers, bed mobility, and  locomotion level  PARTICIPATION LIMITATIONS: meal prep, cleaning, driving, community activity, and church  PERSONAL FACTORS: Fitness, Past/current experiences, Time since onset of injury/illness/exacerbation, and 3+ comorbidities: see PMH  are also affecting patient's functional outcome.   REHAB POTENTIAL: Good  CLINICAL DECISION MAKING: Stable/uncomplicated  EVALUATION COMPLEXITY: Low   GOALS: Goals reviewed with patient? Yes  SHORT TERM GOALS: (target date for Short term goals 11/10/2022)   1.  Patient will demonstrate independent use of home exercise program to maintain progress from in clinic treatments. Baseline: see objective data Goal status:  MET 10/31/2022  2. PROM right knee ext -5* and flexion 90* Baseline: see objective data Goal status: MET 10/31/2022  3. Interim FOTO >32% Baseline: see objective data  Goal status:  MET 10/31/2022  LONG TERM GOALS: (target dates for all long term goals  12/21/2022 ) (for updated cert s/p manipulation)   1. Patient will demonstrate/report pain at worst less than or equal to 2/10 to facilitate minimal limitation in daily activity secondary to pain symptoms. Baseline: see objective data Goal status: Ongoing 11/29/2022   2. Patient will demonstrate independent use of home exercise program to facilitate ability to maintain/progress functional gains from skilled physical therapy services. Baseline: see objective data Goal status: Ongoing 11/29/2022   3. Patient will demonstrate FOTO outcome > or = 47 % to indicate reduced disability due to condition. Baseline: see objective data Goal status:  MET 11/29/2022   4.  Patient will demonstrate right knee MMT 4/5 throughout to faciltiate usual transfers, stairs, squatting at [facility] for daily life.  Baseline: see objective data Goal status: Ongoing 11/29/2022   5.  Patient right  knee AROM ext standing -2* and flexion seated or supine 100* Baseline: see objective data Goal status: Ongoing  11/29/2022   6.  patient amb with LRAD >500' and negotiates ramps/curbs/stairs modified independent.  Baseline: see objective data Goal status: Ongoing  11/29/2022   PLAN:  PT FREQUENCY:  5x/week for 2 weeks, then drop to 2-3x/week for an additional 3 weeks   PT DURATION: 5 weeks   PLANNED INTERVENTIONS: Therapeutic exercises, Therapeutic activity, Neuro Muscular re-education, Balance training, Gait training, Patient/Family education, Joint mobilization, Stair training, DME instructions, Dry Needling, Electrical stimulation, Traction, Cryotherapy, vasopneumatic deviceMoist heat, Taping, Ultrasound, Ionotophoresis 4mg /ml Dexamethasone, and aquatic therapy, Manual therapy.  All included unless contraindicated  PLAN FOR NEXT SESSION:  check LTGs with plan to discharge after aquatic on Friday.    Vladimir Faster, PT, DPT 12/18/2022, 3:22 PM

## 2022-12-20 ENCOUNTER — Ambulatory Visit (INDEPENDENT_AMBULATORY_CARE_PROVIDER_SITE_OTHER): Payer: Medicare Other | Admitting: Physical Therapy

## 2022-12-20 ENCOUNTER — Encounter: Payer: Self-pay | Admitting: Physical Therapy

## 2022-12-20 DIAGNOSIS — M25561 Pain in right knee: Secondary | ICD-10-CM | POA: Diagnosis not present

## 2022-12-20 DIAGNOSIS — M6281 Muscle weakness (generalized): Secondary | ICD-10-CM | POA: Diagnosis not present

## 2022-12-20 DIAGNOSIS — R2681 Unsteadiness on feet: Secondary | ICD-10-CM

## 2022-12-20 DIAGNOSIS — R600 Localized edema: Secondary | ICD-10-CM

## 2022-12-20 DIAGNOSIS — R2689 Other abnormalities of gait and mobility: Secondary | ICD-10-CM

## 2022-12-20 DIAGNOSIS — M25661 Stiffness of right knee, not elsewhere classified: Secondary | ICD-10-CM

## 2022-12-20 NOTE — Therapy (Signed)
OUTPATIENT PHYSICAL THERAPY  TREATMENT  Patient Name: Jessica [name] MRN: [id number] DOB:1950/07/02, 72 y.o., female Today's Date: 12/20/2022   END OF SESSION:  PT End of Session - 12/20/22 1516     Visit Number 34    Number of Visits 35    Date for PT Re-Evaluation 12/21/22    Authorization Type [employer] [facility]    Progress Note Due on Visit 33    PT Start Time 1515    PT Stop Time 1553    PT Time Calculation (min) 38 min    Activity Tolerance Patient tolerated treatment well    Behavior During Therapy WFL for tasks assessed/performed                                         Past Medical History:  Diagnosis Date   Anxiety    Arthritis    bilateral knees   Asthma    childhood, inhaler for wheezing PRN   Autonomic neuropathy    diabetic   Cataract    Celiac disease    Constipation    Depression    Diabetes mellitus    type II, Hemoglobin A1C 9.9 10/05/2011   Diabetic neuropathy (HCC)    Diverticulosis    Fatty liver    Gastroparesis    GERD (gastroesophageal reflux disease)    History of claustrophobia    with MRI tests   Hyperlipidemia    Hypertension    IBS (irritable bowel syndrome)    Kidney stones    Personal history of colonic polyps 03/2010   hyperplastic   PONV (postoperative nausea and vomiting)    Shingles 2021   Past Surgical History:  Procedure Laterality Date   APPENDECTOMY     CATARACT EXTRACTION Right 04/29/2018   CATARACT EXTRACTION Left 03/2018   COLONOSCOPY     DILATATION & CURRETTAGE/HYSTEROSCOPY WITH RESECTOCOPE N/A 03/24/2013   Procedure: DILATATION & CURETTAGE, HYSTEROSCOPY WITH RESECTION;  Surgeon: Levi Aland, MD;  Location: [facility];  Service: Gynecology;  Laterality: N/A;   HYSTEROSCOPY WITH D & C N/A 11/08/2015   Procedure: DILATATION AND CURETTAGE /HYSTEROSCOPY;  Surgeon: Levi Aland, MD;  Location: [facility];  Service: Gynecology;  Laterality: N/A;   left breast cyst removal  2000   LEFT HEART  CATH AND CORONARY ANGIOGRAPHY N/A 10/31/2018   Procedure: LEFT HEART CATH AND CORONARY ANGIOGRAPHY;  Surgeon: Corky Crafts, MD;  Location: [facility];  Service: Cardiovascular;  Laterality: N/A;   TOTAL KNEE ARTHROPLASTY Right 09/12/2022   Procedure: RIGHT TOTAL KNEE ARTHROPLASTY;  Surgeon: Kathryne Hitch, MD;  Location: [facility];  Service: Orthopedics;  Laterality: Right;   TUBAL LIGATION     Patient Active Problem List   Diagnosis Date Noted   Status post total right knee replacement 09/12/2022   Degenerative arthritis of left knee 08/07/2022   Chronic rhinitis 04/18/2022   Gastroesophageal reflux disease 04/18/2022   Vitamin D deficiency 04/13/2022   Morbid obesity (HCC) 04/13/2022   Peripheral sensory neuropathy due to type 2 diabetes mellitus (HCC) 04/05/2020   Bilateral lower extremity edema 01/20/2020   Family history of malignant neoplasm of endometrium 10/08/2018   Celiac disease 02/14/2018   Osteopenia 02/14/2018   Mild intermittent asthma 02/14/2018   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) 02/14/2018   Class 2 obesity due to excess calories with body  mass index (BMI) of 37.0 to 37.9 in adult 02/14/2018   Fibroma of tongue 06/06/2016   Renal calculi 01/13/2016   Degenerative cervical disc 07/27/2015   OSA (obstructive sleep apnea) 02/28/2011   Constipation, slow transit 09/23/2010   Type 2 diabetes mellitus with complication, with long-term current use of insulin (HCC) 09/23/2010   FH: colon cancer 09/23/2010   Personal history of colonic polyps 09/23/2010   Combined hyperlipidemia associated with type 2 diabetes mellitus (HCC) 01/29/2009   Diabetic gastroparesis (HCC) 02/27/2007   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 Mardelle Matte, MD   REFERRING PROVIDER: Vanita Panda. Magnus Ivan, MD  REFERRING DIAG: [phone] (ICD-10-CM) -  Status post total right knee replacement   THERAPY DIAG:  Stiffness of right knee, not elsewhere classified  Acute pain of right knee  Muscle weakness (generalized)  Localized edema  Other abnormalities of gait and mobility  Unsteadiness on feet  Rationale for Evaluation and Treatment: Rehabilitation  ONSET DATE: 09/12/2022  SUBJECTIVE:   SUBJECTIVE STATEMENT: She drove to church meeting 1.5 hours away doing alphabet and moving knee as PT recommended then walked for 5 min upon arrival. Her knee did well.      PERTINENT HISTORY: Right TKA 09/12/2022, DM2, asthma, OA, peripheral neuropathy, osteopenia, shingles  PAIN:  NPRS scale: today 5/10 ranging 0/10 to 5/10 except at night can get up to 6-7/10 Pain location: Rt knee  Pain description: tightness Aggravating factors: sitting prolonged Relieving factors: Tylenol, ice  PRECAUTIONS: Fall  WEIGHT BEARING RESTRICTIONS: No  FALLS:  Has patient fallen in last 6 months? No  LIVING ENVIRONMENT: Lives with: lives with their spouse Lives in: [place] with master handicap accessible bath & bedroom ramped entrance. Does not go upstairs.  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   PATIENT SURVEYS:  12/20/2022: FOTO 62%  12/06/2022: FOTO update:  52%  10/31/2022: FOTO visit 10 52%  Eval: FOTO intake:  21%  predicted:  47%  COGNITION:10/11/2022 Overall cognitive status: WFL    SENSATION: 10/11/2022 WFL  EDEMA:  10/11/2022 Circumferential:  LLE: above knee 51.5cm,  around knee  50.2 cm, below knee 42.5 cm RLE: above knee 55.5 cm,  around knee 53.8 cm, below knee 46.2 cm  POSTURE: 10/11/2022  rounded shoulders, forward head, flexed trunk , and weight shift left  PALPATION: 10/11/2022 Right knee tenderness along joint line & incision.   LOWER EXTREMITY ROM:   ROM Right eval Right 10/13/22 Right 10/16/22 Right 10/25/22 Right 10/31/22 Right 11/08/2022 Right 11/16/22 Right 11/17/22 PROM Right 11/20/22 Right 11/23/22 Right 11/28/22 Right 12/06/22 Right 12/13/22 Right 12/20/22  Knee flexion Seated A: 64* P: 74* Active 75 AA 84* Seated P: 87* A: 77* Seated P: 90* A:83* Supine AROM heel slide: 80    Supine and sitting 90*  Supine 87 deg  Sitting 99 deg s but not pain Seated P: 100* AA: 95* Supine A: 96 P: 107 Seated A: 99* P: 111* Seated A: 103* P: 111* Seated A: 104* P: 113*  Seated A: 105* P: 113*  Knee extension Seated A: LAQ -22* Seated with LE extended P: -11* Active -7 Supine A: -5*  Supine P: -1* A: quad set -3* SAQ -6* Seated AROM LAQ: -14  Supine quad set AROM -6 Seated with heel 14*, with overpressure 12*  Supine P: -1* A: quad set -4*  Seated  LAQ -9* Standing  Quad set -3* Supine P: -1* Seated  LAQ -9* Standing  Quad set -2* Supine A: quad set -5* P: -2* Supine A: quad set  -4* P: -2* Standing A: -2*   (Blank rows = not tested)  LOWER EXTREMITY MMT:  MMT Right 10/11/2022 Right 11/08/2022 Right 11/16/22 Right 12/20/22  Hip flexion      Hip extension      Hip abduction      Hip adduction      Hip internal rotation      Hip external rotation      Knee flexion 3-/5 5/5 in available range  5/5  Knee extension 3-/5 4-+/5 in available range 5 in available ROM 5/5  Ankle dorsiflexion      Ankle plantarflexion      Ankle inversion      Ankle eversion       (Blank rows = not tested)  FUNCTIONAL TESTS:  12/20/2022: TUG with SPC 11.31 sec and without device 9.67 sec  12/01/2022:  TUG c SPC: 15.2 seconds,  13.63 seconds    11/08/2022:  TUG 24 seconds with FWW  10/11/2022 18 inch chair transfer: requires BUE support to arise from 22" w/c to RW for stabilization TUG with RW: 41.75 sec  GAIT: 12/20/2022: Pt amb >500' with cane and 200' without device modified independent.   Pt neg ramp without device safely and curb with cane safely.   Gait velocity: with cane self-selected 2.61 ft/sec and fast pace 3.34 ft/sec  Without device self-selected 2.81 ft/sec and fast pace 3.50 ft/sec  11/27/2022: gait velocity with cane 2.61 ft/sec and without device 2.11 ft/sec  11/08/2022:  Ambulation into clinic c FWW.   10/11/2022 Distance walked: 20' Assistive device utilized: Environmental consultant - 2 wheeled Level of assistance: SBA Comments: Antalgic gait pattern with decreased stance duration right lower extremity, right knee flexed in stance and minimal increase flexion for swing phase and flexed trunk.                     TODAY's TREATMENT                                               DATE: 12/20/2022 Therapeutic Exercise: Gastroc stretch step heel depression 30 sec hold 3 reps BLEs. Precor bike with BLEs seat 6 rocking for flexion stretch for 3 mins; then backwards full revolutions 1 min and forwards 4 min level 1.   See objective data   TREATMENT                                               DATE: 12/18/2022: Therapeutic Exercise: Gastroc stretch step heel depression 30 sec hold 3 reps BLEs. Precor bike with BLEs seat 6 rocking for flexion stretch for 3 mins; then backwards full revolutions 1 min and forwards 4 min level 1.  Seated knee ext / quad set 5 sec and active knee flexion with end range AA 5 sec hold 10 reps.  And seated alphabet.  Both exercises are good way to minimize "stiffness" with prolonged sitting. Squat to 18" chair with BUE sink 10 reps with demo & verbal cues on knee flexion.  Leg press in max bending tolerated - double leg 112 lbs x 15 reps; single leg Rt 56 lbs 2 x 15.  Rest in bending for stretch  Knee  extension machine BLEs 15# 15 reps;  concentric BLEs & isometric / eccentric RLE 5# 10 reps 2 sets Seated knee flexion machine BLEs 25# 15 reps;  RLE only 15# 10 reps 2 sets.   Vaso right knee elevation 34* medium compression 10 min    TREATMENT                                               DATE: 12/15/2022 Therapeutic Exercise: UBE LE lvl 2.5 10 mins, seat 10  Step on over and down WB on Rt leg with single hand on bar 6 inch step x 2, 4 inch step x 15  Leg press double leg 112 lbs x 15, single leg  Additional time spent in review of gym based activity, encouragement to improve confidence in use.  Suggested and encouraged visit to [facility] prior to next visit if possible to allow a trial and question and answer about use.   Also talked though process of aquatics.  Pt had concerns about safety in water by her self.  Will see Arlys John again and assess her safety level for any use.   Neuro Re-ed Tandem stance on foam 1 min x 1 bilateral Standing marching on foam 2 x 10 bilateral with SBA      HOME EXERCISE PROGRAM: Access Code: [email or web address] URL: [email or web address] Date: 12/04/2022 Prepared by: Vladimir Faster  Exercises - Ankle Alphabet in Elevation  - 2-4 x daily - 7 x weekly - 1 sets - 1 reps - supine quad set with towel roll under ankle  - 2-4 x daily - 7 x weekly - 2 sets - 10 reps - 5 seconds hold - Supine Heel Slide with Strap  - 2-3 x daily - 7 x weekly - 2-3 sets - 10 reps - 5 seconds hold - Seated Knee Flexion Extension AROM   - 2-4 x daily - 7 x weekly - 2-3 sets - 10 reps - 5 seconds hold - Supine Knee Extension Strengthening  - 2-4 x daily - 7 x weekly - 2-3 sets - 10 reps - 5 seconds hold - Seated Long Arc Quad  - 2-4 x daily - 7 x weekly - 2-3 sets - 10 reps - 5 seconds hold - standing calf stretch with forefoot on small step or brick  - 1 x daily - 7 x weekly - 1 sets - 3 reps - 30 seconds hold - Seated Table Hamstring Stretch  - 1 x daily - 7 x weekly - 1 sets - 3  reps - 30 seconds hold - Supine Quadriceps Stretch with Strap on Table  - 1 x daily - 7 x weekly - 1 sets - 3 reps - 30 seconds hold - Standing Tandem Balance with Counter Support  - 1 x daily - 7 x weekly - 1 sets - 2 reps - 30 seconds hold -  Heel raises near counter  - 1 x daily - 7 x weekly - 2 sets - 10 reps - 5 seconds hold - Seated Hamstring Curl with Anchored Resistance  - 1 x daily - 4 x weekly - 2-3 sets - 10 reps - 5 seconds hold - standing knee extension  - 1 x daily - 4 x weekly - 2-3 sets - 10 reps - 5 seconds hold  Patient Education - Environmental health practitioner  WATER HEP Access Code: [email or web address] URL: [email or web address] Date: 12/08/2022 Prepared by: Ivery Quale  Exercises - Standing March at [facility]  - 1 x daily - 2 x weekly - 15-20 reps - Standing Hip Flexion Extension at [employer]  - 1 x daily - 2 x weekly - 15-20 reps - Standing Hip Abduction Adduction at Pool Wall  - 1 x daily - 2 x weekly - 15-20 reps - Standing Knee Flexion  - 2 x daily - 6 x weekly - 3 sets - 15-20 reps - Squat  - 1 x daily - 2 x weekly - 15-20 reps - Standing Single Leg Hip Circles  - 1 x daily - 2 x weekly - 1 sets - 15-20 reps - Forward Walking  - 1 x daily - 2 x weekly - 4 sets  ASSESSMENT: CLINICAL IMPRESSION:  Patient has met LTGs. She appears to understand ongoing HEP including weight  machines at gym.  She would benefit from one more aquatic session to ensure understanding & confidence in pool.  Pt has improved her range, strength and function but understands need for ongoing work for optimal outcomes.    OBJECTIVE IMPAIRMENTS: Abnormal gait, decreased activity tolerance, decreased balance, decreased endurance, decreased knowledge of condition, decreased knowledge of use of DME, decreased mobility, difficulty walking, decreased ROM, decreased strength, increased edema, postural dysfunction, obesity, and pain.   ACTIVITY LIMITATIONS: carrying, lifting, bending, sitting, standing,  squatting, sleeping, stairs, transfers, bed mobility, and locomotion level  PARTICIPATION LIMITATIONS: meal prep, cleaning, driving, community activity, and church  PERSONAL FACTORS: Fitness, Past/current experiences, Time since onset of injury/illness/exacerbation, and 3+ comorbidities: see PMH  are also affecting patient's functional outcome.   REHAB POTENTIAL: Good  CLINICAL DECISION MAKING: Stable/uncomplicated  EVALUATION COMPLEXITY: Low   GOALS: Goals reviewed with patient? Yes  SHORT TERM GOALS: (target date for Short term goals 11/10/2022)   1.  Patient will demonstrate independent use of home exercise program to maintain progress from in clinic treatments. Baseline: see objective data Goal status:  MET 10/31/2022  2. PROM right knee ext -5* and flexion 90* Baseline: see objective data Goal status: MET 10/31/2022  3. Interim FOTO >32% Baseline: see objective data  Goal status:  MET 10/31/2022  LONG TERM GOALS: (target dates for all long term goals  12/21/2022 ) (for updated cert s/p manipulation)   1. Patient will demonstrate/report pain at worst less than or equal to 2/10 to facilitate minimal limitation in daily activity secondary to pain symptoms. Baseline: see objective data Goal status: Met 12/18/2022   2. Patient will demonstrate independent use of home exercise program to facilitate ability to maintain/progress functional gains from skilled physical therapy services. Baseline: see objective data Goal status: Ongoing 11/29/2022   3. Patient will demonstrate FOTO outcome > or = 47 % to indicate reduced disability due to condition. Baseline: see objective data Goal status:  MET 11/29/2022   4.  Patient will demonstrate right knee MMT 4/5 throughout to faciltiate usual transfers, stairs, squatting at [facility] for daily life.  Baseline: see objective data Goal status: MET 12/20/2022   5.  Patient right knee AROM ext standing -2* and flexion seated or supine 100* Baseline:  see objective data Goal status: MET 12/20/2022   6.  patient amb with LRAD >500' and negotiates ramps/curbs/stairs modified independent.  Baseline: see objective data Goal status: MET 12/20/2022   PLAN:  PT FREQUENCY:  5x/week for 2 weeks, then drop to 2-3x/week for an additional 3 weeks   PT DURATION: 5 weeks   PLANNED INTERVENTIONS: Therapeutic exercises, Therapeutic activity, Neuro Muscular re-education, Balance training, Gait training, Patient/Family education, Joint mobilization, Stair training, DME instructions, Dry Needling, Electrical stimulation, Traction, Cryotherapy, vasopneumatic deviceMoist heat, Taping, Ultrasound, Ionotophoresis 4mg /ml Dexamethasone, and aquatic therapy, Manual therapy.  All included unless contraindicated  PLAN FOR NEXT SESSION: discharge after aquatic on Friday.    Vladimir Faster, PT, DPT 12/20/2022, 3:57 PM

## 2022-12-22 ENCOUNTER — Ambulatory Visit (INDEPENDENT_AMBULATORY_CARE_PROVIDER_SITE_OTHER): Payer: Medicare Other | Admitting: Physical Therapy

## 2022-12-22 ENCOUNTER — Encounter: Payer: Self-pay | Admitting: Physical Therapy

## 2022-12-22 DIAGNOSIS — M25561 Pain in right knee: Secondary | ICD-10-CM | POA: Diagnosis not present

## 2022-12-22 DIAGNOSIS — M6281 Muscle weakness (generalized): Secondary | ICD-10-CM

## 2022-12-22 DIAGNOSIS — R2689 Other abnormalities of gait and mobility: Secondary | ICD-10-CM

## 2022-12-22 DIAGNOSIS — M25661 Stiffness of right knee, not elsewhere classified: Secondary | ICD-10-CM

## 2022-12-22 DIAGNOSIS — R600 Localized edema: Secondary | ICD-10-CM

## 2022-12-22 NOTE — Therapy (Signed)
OUTPATIENT PHYSICAL THERAPY  TREATMENT/Discharge PHYSICAL THERAPY DISCHARGE SUMMARY  Visits from Start of Care: 35  Current functional level related to goals / functional outcomes: See below   Remaining deficits: See below   Education / Equipment: HEP  Plan:  Patient goals were  met so we will transition her to independent program today.      Patient Name: Jessica [name] MRN: [id number] DOB:July 04, 1950, 72 y.o., female Today's Date: 12/22/2022   END OF SESSION:  PT End of Session - 12/22/22 0944     Visit Number 35    Number of Visits 35    Date for PT Re-Evaluation 12/21/22    Authorization Type [employer] [facility]    Progress Note Due on Visit 33    PT Start Time 0930    PT Stop Time 1010    PT Time Calculation (min) 40 min    Activity Tolerance Patient tolerated treatment well    Behavior During Therapy WFL for tasks assessed/performed                                         Past Medical History:  Diagnosis Date   Anxiety    Arthritis    bilateral knees   Asthma    childhood, inhaler for wheezing PRN   Autonomic neuropathy    diabetic   Cataract    Celiac disease    Constipation    Depression    Diabetes mellitus    type II, Hemoglobin A1C 9.9 10/05/2011   Diabetic neuropathy (HCC)    Diverticulosis    Fatty liver    Gastroparesis    GERD (gastroesophageal reflux disease)    History of claustrophobia    with MRI tests   Hyperlipidemia    Hypertension    IBS (irritable bowel syndrome)    Kidney stones    Personal history of colonic polyps 03/2010   hyperplastic   PONV (postoperative nausea and vomiting)    Shingles 2021   Past Surgical History:  Procedure Laterality Date   APPENDECTOMY     CATARACT EXTRACTION Right 04/29/2018   CATARACT EXTRACTION Left 03/2018   COLONOSCOPY     DILATATION & CURRETTAGE/HYSTEROSCOPY WITH RESECTOCOPE N/A 03/24/2013   Procedure: DILATATION & CURETTAGE, HYSTEROSCOPY WITH RESECTION;   Surgeon: Levi Aland, MD;  Location: [facility];  Service: Gynecology;  Laterality: N/A;   HYSTEROSCOPY WITH D & C N/A 11/08/2015   Procedure: DILATATION AND CURETTAGE /HYSTEROSCOPY;  Surgeon: Levi Aland, MD;  Location: [facility];  Service: Gynecology;  Laterality: N/A;   left breast cyst removal  2000   LEFT HEART CATH AND CORONARY ANGIOGRAPHY N/A 10/31/2018   Procedure: LEFT HEART CATH AND CORONARY ANGIOGRAPHY;  Surgeon: Corky Crafts, MD;  Location: [facility] INVASIVE CV LAB;  Service: Cardiovascular;  Laterality: N/A;   TOTAL KNEE ARTHROPLASTY Right 09/12/2022   Procedure: RIGHT TOTAL KNEE ARTHROPLASTY;  Surgeon: Kathryne Hitch, MD;  Location: [facility];  Service: Orthopedics;  Laterality: Right;   TUBAL LIGATION     Patient Active Problem List   Diagnosis Date Noted   Status post total right knee replacement 09/12/2022   Degenerative arthritis of left knee 08/07/2022   Chronic rhinitis 04/18/2022   Gastroesophageal reflux disease 04/18/2022   Vitamin D deficiency 04/13/2022   Morbid obesity (HCC) 04/13/2022   Peripheral sensory neuropathy due to type 2 diabetes mellitus (HCC)  04/05/2020   Bilateral lower extremity edema 01/20/2020   Family history of malignant neoplasm of endometrium 10/08/2018   Celiac disease 02/14/2018   Osteopenia 02/14/2018   Mild intermittent asthma 02/14/2018   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) 02/14/2018   Class 2 obesity due to excess calories with body mass index (BMI) of 37.0 to 37.9 in adult 02/14/2018   Fibroma of tongue 06/06/2016   Renal calculi 01/13/2016   Degenerative cervical disc 07/27/2015   OSA (obstructive sleep apnea) 02/28/2011   Constipation, slow transit 09/23/2010   Type 2 diabetes mellitus with complication, with long-term current use of insulin (HCC) 09/23/2010   FH: colon cancer 09/23/2010   Personal history of colonic polyps 09/23/2010   Combined hyperlipidemia associated with type 2 diabetes  mellitus (HCC) 01/29/2009   Diabetic gastroparesis (HCC) 02/27/2007   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 Mardelle Matte, MD   REFERRING PROVIDER: Vanita Panda. Magnus Ivan, MD  REFERRING DIAG: [phone] (ICD-10-CM) - Status post total right knee replacement   THERAPY DIAG:  Stiffness of right knee, not elsewhere classified  Acute pain of right knee  Muscle weakness (generalized)  Localized edema  Other abnormalities of gait and mobility  Rationale for Evaluation and Treatment: Rehabilitation  ONSET DATE: 09/12/2022  SUBJECTIVE:   SUBJECTIVE STATEMENT: She relays her knee is doing well today     PERTINENT HISTORY: Right TKA 09/12/2022, DM2, asthma, OA, peripheral neuropathy, osteopenia, shingles  PAIN:  NPRS scale: today 5/10 ranging 0/10 to 5/10 except at night can get up to 6-7/10 Pain location: Rt knee  Pain description: tightness Aggravating factors: sitting prolonged Relieving factors: Tylenol, ice  PRECAUTIONS: Fall  WEIGHT BEARING RESTRICTIONS: No  FALLS:  Has patient fallen in last 6 months? No  LIVING ENVIRONMENT: Lives with: lives with their spouse Lives in: [place] with master handicap accessible bath & bedroom ramped entrance. Does not go upstairs.  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   PATIENT SURVEYS:  12/20/2022: FOTO 62%  12/06/2022: FOTO update:  52%  10/31/2022: FOTO visit 10 52%  Eval: FOTO intake:   21%  predicted:  47%  COGNITION:10/11/2022 Overall cognitive status: WFL    SENSATION: 10/11/2022 WFL  EDEMA:  10/11/2022 Circumferential:  LLE: above knee 51.5cm,  around knee 50.2 cm, below knee 42.5 cm RLE: above knee 55.5 cm,  around knee 53.8 cm, below knee 46.2 cm  POSTURE: 10/11/2022  rounded shoulders, forward head, flexed trunk , and weight shift left  PALPATION: 10/11/2022 Right knee tenderness along joint line & incision.   LOWER EXTREMITY ROM:   ROM Right eval Right 10/13/22 Right 10/16/22 Right 10/25/22 Right 10/31/22 Right 11/08/2022 Right 11/16/22 Right 11/17/22 PROM Right 11/20/22 Right 11/23/22 Right 11/28/22 Right 12/06/22 Right 12/13/22 Right 12/20/22  Knee flexion Seated A: 64* P: 74* Active 75 AA 84* Seated P: 87* A: 77* Seated P: 90* A:83* Supine AROM heel slide: 80    Supine and sitting 90*  Supine 87 deg  Sitting 99 deg s but not pain Seated P: 100* AA: 95* Supine A: 96 P: 107 Seated A: 99* P: 111* Seated A: 103* P: 111* Seated A: 104* P: 113* Seated A: 105* P: 113*  Knee extension Seated A: LAQ -22* Seated with LE extended P: -11* Active -7 Supine A: -5*  Supine P: -1* A: quad set -3* SAQ -6* Seated AROM LAQ: -14  Supine quad set AROM -6 Seated with heel 14*, with overpressure 12*  Supine P: -1* A: quad set -4*  Seated  LAQ -9* Standing  Quad set -3* Supine P: -1* Seated  LAQ -9* Standing  Quad set -2* Supine A: quad set -5* P: -2* Supine A: quad set  -4* P: -2* Standing A: -2*   (Blank rows = not tested)  LOWER EXTREMITY MMT:  MMT Right 10/11/2022 Right 11/08/2022 Right 11/16/22 Right 12/20/22  Hip flexion      Hip extension      Hip abduction      Hip adduction      Hip internal rotation      Hip external rotation      Knee flexion 3-/5 5/5 in available range  5/5  Knee extension 3-/5 4-+/5 in available range 5 in available ROM 5/5  Ankle dorsiflexion      Ankle plantarflexion      Ankle inversion       Ankle eversion       (Blank rows = not tested)  FUNCTIONAL TESTS:  12/20/2022: TUG with SPC 11.31 sec and without device 9.67 sec  12/01/2022:  TUG c SPC: 15.2 seconds,  13.63 seconds   11/08/2022:  TUG 24 seconds with FWW  10/11/2022 18 inch chair transfer: requires BUE support to arise from 22" w/c to RW for stabilization TUG with RW: 41.75 sec  GAIT: 12/20/2022: Pt amb >500' with cane and 200' without device modified independent.   Pt neg ramp without device safely and curb with cane safely.   Gait velocity: with cane self-selected 2.61 ft/sec and fast pace 3.34 ft/sec  Without device self-selected 2.81 ft/sec and fast pace 3.50 ft/sec  11/27/2022: gait velocity with cane 2.61 ft/sec and without device 2.11 ft/sec  11/08/2022:  Ambulation into clinic c FWW.   10/11/2022 Distance walked: 20' Assistive device utilized: Environmental consultant - 2 wheeled Level of assistance: SBA Comments: Antalgic gait pattern with decreased stance duration right lower extremity, right knee flexed in stance and minimal increase flexion for swing phase and flexed trunk.                     TODAY's TREATMENT 12/22/22 Pt seen for aquatic therapy today.  Treatment took place in water 3.5-4.75 ft in depth at the [facility] pool. Temp of water was 91.  Pt entered/exited the pool via stairs with hand rail.   Pt requires the buoyancy and hydrostatic pressure of water for support, and to offload joints by unweighting joint load by at least 50 % in navel deep water and by at least 75-80% in chest to neck deep water.  Viscosity of the water is needed for resistance of strengthening. Water current perturbations provides challenge to standing balance requiring increased core activation. Aquatic PT exercises Seated on pool chair and cycling 2 min Seated knee flexion stretch AAROM 5 sec X 15 Standing hamstring curls with UE support X 20 bilat Standing marches X 20 bilat Leg swings hip abd/add X 20 bilat with UE  support with UE support Leg  swings hip flexion/extension X 20 bilat Sidestepping length of pool 3 round trips  Forward walking  3 round trips backward walking 3 round trips Heel and toe raises 2X15 with UE support   Step ups onto first step of pool with UE support X 15 bilat 1/4 squat with back against wall, shoulder extension X15 with kickboard 1/4 squat with back against wall, push/pull kickboard 2X15   Water PT HEP Exercises printed and laminated for her     HOME EXERCISE PROGRAM: Access Code: [email or web address] URL: [email or web address] Date: 12/04/2022 Prepared by: Vladimir Faster  Exercises - Ankle Alphabet in Elevation  - 2-4 x daily - 7 x weekly - 1 sets - 1 reps - supine quad set with towel roll under ankle  - 2-4 x daily - 7 x weekly - 2 sets - 10 reps - 5 seconds hold - Supine Heel Slide with Strap  - 2-3 x daily - 7 x weekly - 2-3 sets - 10 reps - 5 seconds hold - Seated Knee Flexion Extension AROM   - 2-4 x daily - 7 x weekly - 2-3 sets - 10 reps - 5 seconds hold - Supine Knee Extension Strengthening  - 2-4 x daily - 7 x weekly - 2-3 sets - 10 reps - 5 seconds hold - Seated Long Arc Quad  - 2-4 x daily - 7 x weekly - 2-3 sets - 10 reps - 5 seconds hold - standing calf stretch with forefoot on small step or brick  - 1 x daily - 7 x weekly - 1 sets - 3 reps - 30 seconds hold - Seated Table Hamstring Stretch  - 1 x daily - 7 x weekly - 1 sets - 3 reps - 30 seconds hold - Supine Quadriceps Stretch with Strap on Table  - 1 x daily - 7 x weekly - 1 sets - 3 reps - 30 seconds hold - Standing Tandem Balance with Counter Support  - 1 x daily - 7 x weekly - 1 sets - 2 reps - 30 seconds hold - Heel raises near counter  - 1 x daily - 7 x weekly - 2 sets - 10 reps - 5 seconds hold - Seated Hamstring Curl with Anchored Resistance  - 1 x daily - 4 x weekly - 2-3 sets - 10 reps - 5 seconds hold - standing knee extension  - 1 x daily - 4 x weekly - 2-3 sets - 10 reps - 5 seconds  hold  Patient Education - Environmental health practitioner  WATER HEP Access Code: [email or web address] URL: [email or web address] Date: 12/08/2022 Prepared by: Ivery Quale  Exercises - Standing March at [facility]  - 1 x daily - 2 x weekly - 15-20 reps - Standing Hip Flexion Extension at [employer]  - 1 x daily - 2 x weekly - 15-20 reps - Standing Hip Abduction Adduction at Pool Wall  - 1 x daily - 2 x weekly - 15-20 reps - Standing Knee Flexion  - 2 x daily - 6 x weekly - 3 sets - 15-20 reps - Squat  - 1 x daily - 2 x weekly - 15-20 reps - Standing Single Leg Hip Circles  - 1 x daily - 2 x weekly - 1 sets - 15-20 reps - Forward Walking  - 1 x daily - 2 x weekly - 4 sets  ASSESSMENT: CLINICAL IMPRESSION:  Patient goals were met so we will transition her to independent program today.   Pt has improved her  range, strength and function but understands need for ongoing work for optimal outcomes. I did also print and laminate water HEP exercises that she can also incorporate.     OBJECTIVE IMPAIRMENTS: Abnormal gait, decreased activity tolerance, decreased balance, decreased endurance, decreased knowledge of condition, decreased knowledge of use of DME, decreased mobility, difficulty walking, decreased ROM, decreased strength, increased edema, postural dysfunction, obesity, and pain.   ACTIVITY LIMITATIONS: carrying, lifting, bending, sitting, standing, squatting, sleeping, stairs, transfers, bed mobility, and locomotion level  PARTICIPATION LIMITATIONS: meal prep, cleaning, driving, community activity, and church  PERSONAL FACTORS: Fitness, Past/current experiences, Time since onset of injury/illness/exacerbation, and 3+ comorbidities: see PMH  are also affecting patient's functional outcome.   REHAB POTENTIAL: Good  CLINICAL DECISION MAKING: Stable/uncomplicated  EVALUATION COMPLEXITY: Low   GOALS: Goals reviewed with patient? Yes  SHORT TERM GOALS: (target date for Short term goals  11/10/2022)   1.  Patient will demonstrate independent use of home exercise program to maintain progress from in clinic treatments. Baseline: see objective data Goal status:  MET 10/31/2022  2. PROM right knee ext -5* and flexion 90* Baseline: see objective data Goal status: MET 10/31/2022  3. Interim FOTO >32% Baseline: see objective data  Goal status:  MET 10/31/2022  LONG TERM GOALS: (target dates for all long term goals  12/21/2022 ) (for updated cert s/p manipulation)   1. Patient will demonstrate/report pain at worst less than or equal to 2/10 to facilitate minimal limitation in daily activity secondary to pain symptoms. Baseline: see objective data Goal status: Met 12/18/2022   2. Patient will demonstrate independent use of home exercise program to facilitate ability to maintain/progress functional gains from skilled physical therapy services. Baseline: see objective data Goal status: MET 12/22/22, she sowed good understanding of water program HEP today.    3. Patient will demonstrate FOTO outcome > or = 47 % to indicate reduced disability due to condition. Baseline: see objective data Goal status:  MET 11/29/2022   4.  Patient will demonstrate right knee MMT 4/5 throughout to faciltiate usual transfers, stairs, squatting at [facility] for daily life.  Baseline: see objective data Goal status: MET 12/20/2022   5.  Patient right knee AROM ext standing -2* and flexion seated or supine 100* Baseline: see objective data Goal status: MET 12/20/2022   6.  patient amb with LRAD >500' and negotiates ramps/curbs/stairs modified independent.  Baseline: see objective data Goal status: MET 12/20/2022   PLAN:  PT FREQUENCY:  5x/week for 2 weeks, then drop to 2-3x/week for an additional 3 weeks   PT DURATION: 5 weeks   PLANNED INTERVENTIONS: Therapeutic exercises, Therapeutic activity, Neuro Muscular re-education, Balance training, Gait training, Patient/Family education, Joint mobilization,  Stair training, DME instructions, Dry Needling, Electrical stimulation, Traction, Cryotherapy, vasopneumatic deviceMoist heat, Taping, Ultrasound, Ionotophoresis 4mg /ml Dexamethasone, and aquatic therapy, Manual therapy.  All included unless contraindicated  PLAN FOR NEXT SESSION: discharge today to independent program  April Manson, PT, DPT 12/22/2022, 9:45 AM

## 2022-12-28 ENCOUNTER — Other Ambulatory Visit: Payer: Self-pay | Admitting: Internal Medicine

## 2022-12-28 DIAGNOSIS — Z794 Long term (current) use of insulin: Secondary | ICD-10-CM

## 2023-01-03 ENCOUNTER — Ambulatory Visit (INDEPENDENT_AMBULATORY_CARE_PROVIDER_SITE_OTHER): Payer: Medicare Other | Admitting: Bariatrics

## 2023-01-03 ENCOUNTER — Encounter: Payer: Self-pay | Admitting: Bariatrics

## 2023-01-03 VITALS — BP 170/73 | HR 81 | Temp 98.0°F | Ht 65.0 in | Wt 205.0 lb

## 2023-01-03 DIAGNOSIS — Z7985 Long-term (current) use of injectable non-insulin antidiabetic drugs: Secondary | ICD-10-CM

## 2023-01-03 DIAGNOSIS — Z794 Long term (current) use of insulin: Secondary | ICD-10-CM | POA: Diagnosis not present

## 2023-01-03 DIAGNOSIS — E1142 Type 2 diabetes mellitus with diabetic polyneuropathy: Secondary | ICD-10-CM | POA: Diagnosis not present

## 2023-01-03 DIAGNOSIS — E669 Obesity, unspecified: Secondary | ICD-10-CM | POA: Diagnosis not present

## 2023-01-03 DIAGNOSIS — Z6834 Body mass index (BMI) 34.0-34.9, adult: Secondary | ICD-10-CM

## 2023-01-03 DIAGNOSIS — I10 Essential (primary) hypertension: Secondary | ICD-10-CM

## 2023-01-03 MED ORDER — TRULICITY 0.75 MG/0.5ML ~~LOC~~ SOAJ
SUBCUTANEOUS | 0 refills | Status: DC
Start: 2023-01-03 — End: 2023-02-07

## 2023-01-03 MED ORDER — ONDANSETRON 4 MG PO TBDP
4.0000 mg | ORAL_TABLET | Freq: Three times a day (TID) | ORAL | 0 refills | Status: DC | PRN
Start: 2023-01-03 — End: 2023-02-07

## 2023-01-03 NOTE — Progress Notes (Signed)
WEIGHT SUMMARY AND BIOMETRICS  Weight Lost Since Last Visit: 4lb  Vitals Temp: 98 F (36.7 C) BP: (!) 170/73 Pulse Rate: 81 SpO2: 99 %   Anthropometric Measurements Height: 5\' 5"  (1.651 m) Weight: 205 lb (93 kg) BMI (Calculated): 34.11 Weight at Last Visit: 209lb Weight Lost Since Last Visit: 4lb Starting Weight: 246lb Total Weight Loss (lbs): 41 lb (18.6 kg)   Body Composition  Body Fat %: 46 % Fat Mass (lbs): 94.4 lbs Muscle Mass (lbs): 105 lbs Total Body Water (lbs): 78.4 lbs Visceral Fat Rating : 14   Other Clinical Data Fasting: no Labs: no Today's Visit #: 70 Starting Date: 03/12/19    OBESITY Jessica [name] is here to discuss her progress with her obesity treatment plan along with follow-up of her obesity related diagnoses.     Nutrition Plan: the Category 3 plan - 95% adherence.  Current exercise:  Cubie  Interim History:  She is down 4 lbs since her last visit. She had orthopaedic surgery which has slowed her weight loss down slightly.   Eating all of the food on the plan., Protein intake is as prescribed, Is not skipping meals, and Water intake is adequate.  Pharmacotherapy: Jessica [name] is on Trulicity 0.75 mg SQ weekly Adverse side effects: None Hunger is moderately controlled.  Cravings are moderately controlled.  Assessment/Plan:   1. Type 2 diabetes mellitus with diabetic polyneuropathy, with long-term current use of insulin (HCC) Type II Diabetes HgbA1c is at goal. Last A1c was 6.3 CBGs: Fasting 120's       Episodes of hypoglycemia: no Medication(s): Trulicity 0.75 mg SQ weekly  Lab Results  Component Value Date   HGBA1C 6.3 (H) 08/31/2022   HGBA1C 7.2 (A) 06/14/2022   HGBA1C 6.6 (A) 02/08/2022   Lab Results  Component Value Date   MICROALBUR <0.7 10/19/2021   LDLCALC 72 04/12/2022   CREATININE 0.93 09/13/2022   Lab Results  Component Value Date   GFR 57.36 (L) 04/12/2022   GFR 60.62 06/09/2021   GFR 58.47 (L) 04/13/2021     Plan: Continue Trulicity 0.75 mg SQ weekly Continue all other medications.  Will keep all carbohydrates low both sweets and starches.  Will continue exercise regimen to 30 to 60 minutes on most days of the week.  Aim for 7 to 9 hours of sleep nightly.  Eat more low glycemic index foods.      Generalized Obesity: Current BMI BMI (Calculated): 34.11   Pharmacotherapy Plan Continue and refill  Trulicity 0.75 mg SQ weekly  Jessica [name] is currently in the action stage of change. As such, her goal is to continue to lose weight.  She has agreed to the Category 3 plan.  Exercise goals: Older adults should determine their level of effort for physical activity relative to their level of fitness.   Behavioral modification strategies: increasing lean protein intake, no meal skipping, meal planning , increase water intake, better snacking choices, and planning for success.  Jessica [name] has agreed to follow-up with our clinic in 4 weeks.     Objective:   VITALS: Per patient if applicable, see vitals. GENERAL: Alert and in no acute distress. CARDIOPULMONARY: No increased WOB. Speaking in clear sentences.  PSYCH: Pleasant and cooperative. Speech normal rate and rhythm. Affect is appropriate. Insight and judgement are appropriate. Attention is focused, linear, and appropriate.  NEURO: Oriented as arrived to appointment on time with no prompting.   Attestation Statements:   This was prepared with the assistance of Engineer, civil (consulting).  Occasional wrong-word or sound-a-like substitutions may have occurred due to the inherent limitations of voice recognition software.

## 2023-01-28 ENCOUNTER — Other Ambulatory Visit: Payer: Self-pay | Admitting: Bariatrics

## 2023-01-28 DIAGNOSIS — E1142 Type 2 diabetes mellitus with diabetic polyneuropathy: Secondary | ICD-10-CM

## 2023-01-31 ENCOUNTER — Other Ambulatory Visit: Payer: Self-pay | Admitting: Bariatrics

## 2023-01-31 DIAGNOSIS — E559 Vitamin D deficiency, unspecified: Secondary | ICD-10-CM

## 2023-02-07 ENCOUNTER — Ambulatory Visit (INDEPENDENT_AMBULATORY_CARE_PROVIDER_SITE_OTHER): Payer: Medicare Other | Admitting: Bariatrics

## 2023-02-07 ENCOUNTER — Encounter: Payer: Self-pay | Admitting: Bariatrics

## 2023-02-07 VITALS — BP 142/70 | HR 74 | Temp 97.8°F | Ht 65.0 in | Wt 206.0 lb

## 2023-02-07 DIAGNOSIS — E1142 Type 2 diabetes mellitus with diabetic polyneuropathy: Secondary | ICD-10-CM | POA: Diagnosis not present

## 2023-02-07 DIAGNOSIS — Z7985 Long-term (current) use of injectable non-insulin antidiabetic drugs: Secondary | ICD-10-CM

## 2023-02-07 DIAGNOSIS — E669 Obesity, unspecified: Secondary | ICD-10-CM | POA: Diagnosis not present

## 2023-02-07 DIAGNOSIS — I10 Essential (primary) hypertension: Secondary | ICD-10-CM

## 2023-02-07 DIAGNOSIS — K3184 Gastroparesis: Secondary | ICD-10-CM

## 2023-02-07 DIAGNOSIS — E66811 Obesity, class 1: Secondary | ICD-10-CM

## 2023-02-07 DIAGNOSIS — E1143 Type 2 diabetes mellitus with diabetic autonomic (poly)neuropathy: Secondary | ICD-10-CM

## 2023-02-07 DIAGNOSIS — Z6834 Body mass index (BMI) 34.0-34.9, adult: Secondary | ICD-10-CM

## 2023-02-07 DIAGNOSIS — Z794 Long term (current) use of insulin: Secondary | ICD-10-CM

## 2023-02-07 MED ORDER — TRULICITY 0.75 MG/0.5ML ~~LOC~~ SOAJ
SUBCUTANEOUS | 0 refills | Status: DC
Start: 2023-02-07 — End: 2023-03-13

## 2023-02-07 MED ORDER — ONDANSETRON 4 MG PO TBDP
4.0000 mg | ORAL_TABLET | Freq: Three times a day (TID) | ORAL | 0 refills | Status: DC | PRN
Start: 2023-02-07 — End: 2023-05-17

## 2023-02-07 NOTE — Progress Notes (Signed)
WEIGHT SUMMARY AND BIOMETRICS  Weight Lost Since Last Visit: 0  Weight Gained Since Last Visit: 1lb   Vitals Temp: 97.8 F (36.6 C) BP: (!) 166/65 Pulse Rate: 74 SpO2: 99 %   Anthropometric Measurements Height: 5\' 5"  (1.651 m) Weight: 206 lb (93.4 kg) BMI (Calculated): 34.28 Weight at Last Visit: 205lb Weight Lost Since Last Visit: 0 Weight Gained Since Last Visit: 1lb Starting Weight: 246lb Total Weight Loss (lbs): 40 lb (18.1 kg)   Body Composition  Body Fat %: 46.3 % Fat Mass (lbs): 95.6 lbs Muscle Mass (lbs): 105.4 lbs Total Body Water (lbs): 79.4 lbs Visceral Fat Rating : 14   Other Clinical Data Fasting: no Labs: no Today's Visit #: 23 Starting Date: 03/12/19    OBESITY Onna is here to discuss her progress with her obesity treatment plan along with follow-up of her obesity related diagnoses.     Nutrition Plan: the Category 3 plan - 80% adherence.  Current exercise:  Patient is doing her cubie daily.   Interim History:  She is up 1 lb since her last visit.  Eating all of the food on the plan., Is not skipping meals, Meeting protein goals., and Water intake is inadequate.  Pharmacotherapy: Von is on Trulicity 0.75 mg SQ weekly Adverse side effects: None Hunger is moderately controlled.  Cravings are moderately controlled.  Assessment/Plan:   1. Type 2 diabetes mellitus with diabetic polyneuropathy, with long-term current use of insulin (HCC) Type II Diabetes HgbA1c is at goal. Last A1c was 6.3 Episodes of hypoglycemia: no Medication(s): Trulicity 0.75 mg SQ weekly  Lab Results  Component Value Date   HGBA1C 6.3 (H) 08/31/2022   HGBA1C 7.2 (A) 06/14/2022   HGBA1C 6.6 (A) 02/08/2022   Lab Results  Component Value Date   MICROALBUR <0.7 10/19/2021   LDLCALC 72 04/12/2022   CREATININE 0.93 09/13/2022   Lab Results  Component Value Date   GFR 57.36 (L) 04/12/2022   GFR 60.62 06/09/2021   GFR 58.47 (L) 04/13/2021     Plan: Continue and refill Trulicity 0.75 mg SQ weekly Continue all other medications.  Will keep all carbohydrates low both sweets and starches.  Will continue exercise regimen to 30 to 60 minutes on most days of the week.  Aim for 7 to 9 hours of sleep nightly.  Eat more low glycemic index foods  Gastroparesis:   She is taking her Trulicity at the lowest dose and is doing fairly well with only occasional nausea.  I have not been able to increase the dose secondary to her gastroparesis.  Plan:  She will continue her Trulicity at the lowest dose, see prescription.  She will continue her Zofran if she needs it for her nausea.  Prescription was written for Zofran.  She will continue to eat foods that agree with her, and will avoid gluten products.  Hypertension Hypertension control uncertain.  Medication(s): Benicar 40 mg once daily   BP Readings from Last 3 Encounters:  02/07/23 (!) 142/70  01/03/23 (!) 170/73  10/09/22 138/70   Lab Results  Component Value Date   CREATININE 0.93 09/13/2022   CREATININE 1.08 (H) 08/31/2022   CREATININE 0.99 04/12/2022   Lab Results  Component Value Date   GFR 57.36 (L) 04/12/2022   GFR 60.62 06/09/2021   GFR 58.47 (L) 04/13/2021    Plan: Continue all antihypertensives at current dosages. She will check blood pressure at home and record.  No added salt. Will keep sodium content to 1,500  mg or less per day.     Generalized Obesity: Current BMI BMI (Calculated): 34.28   Pharmacotherapy Plan Continue and refill  Trulicity 0.75 mg SQ weekly  Baeleigh is currently in the action stage of change. As such, her goal is to continue with weight loss efforts.  She has agreed to the Category 3 plan.  Exercise goals: Older adults should follow the adult guidelines. When older adults cannot meet the adult guidelines, they should be as physically active as their abilities and conditions will allow.   Behavioral modification strategies:  increasing lean protein intake, no meal skipping, meal planning , increasing fiber rich foods, and mindful eating.  Solomia has agreed to follow-up with our clinic in 4 weeks.     Objective:   VITALS: Per patient if applicable, see vitals. GENERAL: Alert and in no acute distress. CARDIOPULMONARY: No increased WOB. Speaking in clear sentences.  PSYCH: Pleasant and cooperative. Speech normal rate and rhythm. Affect is appropriate. Insight and judgement are appropriate. Attention is focused, linear, and appropriate.  NEURO: Oriented as arrived to appointment on time with no prompting.   Attestation Statements:   This was prepared with the assistance of Engineer, civil (consulting).  Occasional wrong-word or sound-a-like substitutions may have occurred due to the inherent limitations of voice recognition software.  Corinna Capra, DO

## 2023-02-19 ENCOUNTER — Other Ambulatory Visit: Payer: Self-pay | Admitting: Internal Medicine

## 2023-02-19 DIAGNOSIS — Z794 Long term (current) use of insulin: Secondary | ICD-10-CM

## 2023-02-22 ENCOUNTER — Telehealth: Payer: Self-pay

## 2023-02-22 NOTE — Telephone Encounter (Signed)
Note faxed.

## 2023-02-22 NOTE — Telephone Encounter (Signed)
Molly with Dr. Mart Piggs dentist office would like pre-medication letter/note faxed to [phone].  Please advise.  Thank you.

## 2023-03-04 ENCOUNTER — Other Ambulatory Visit: Payer: Self-pay | Admitting: Bariatrics

## 2023-03-04 DIAGNOSIS — E1142 Type 2 diabetes mellitus with diabetic polyneuropathy: Secondary | ICD-10-CM

## 2023-03-05 ENCOUNTER — Ambulatory Visit: Payer: Medicare Other | Admitting: Orthopaedic Surgery

## 2023-03-07 ENCOUNTER — Ambulatory Visit: Payer: Medicare Other | Admitting: Bariatrics

## 2023-03-07 ENCOUNTER — Ambulatory Visit (INDEPENDENT_AMBULATORY_CARE_PROVIDER_SITE_OTHER): Payer: Medicare Other | Admitting: Orthopaedic Surgery

## 2023-03-07 ENCOUNTER — Encounter: Payer: Self-pay | Admitting: Orthopaedic Surgery

## 2023-03-07 DIAGNOSIS — Z96651 Presence of right artificial knee joint: Secondary | ICD-10-CM

## 2023-03-07 MED ORDER — CELECOXIB 200 MG PO CAPS: 200.0000 mg | ORAL_CAPSULE | Freq: Two times a day (BID) | ORAL | 1 refills | Status: DC | PRN

## 2023-03-07 NOTE — Progress Notes (Signed)
The patient is a 72 year old female who is 6 months almost status post a right knee replacement.  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  We took her to the operating room at about 6 to 7 weeks for manipulation under anesthesia and were able to flex her back to 115 degrees.  She has lost most of that in terms of her range of motion.  She is on chronic gabapentin as well.  On exam her extension is almost full but I can maybe flex her to close to 90 degrees and not really past that at all.  Her hip exam is normal with good range of motion on the right side but again the knee range of motion is significant limited.  As of now would not recommend any other type of intervention other than potentially an open arthrotomy with lysis of adhesions and a poly exchange.  However I would wait till she is much further out from surgery before even considering that.  I will send in some Celebrex and see if this helps with the inflammatory type of pain that she is experiencing.  We can see her back again in 3 months with a standing AP and lateral of her right operative knee.

## 2023-03-13 ENCOUNTER — Ambulatory Visit (INDEPENDENT_AMBULATORY_CARE_PROVIDER_SITE_OTHER): Payer: Medicare Other | Admitting: Bariatrics

## 2023-03-13 ENCOUNTER — Encounter: Payer: Self-pay | Admitting: Bariatrics

## 2023-03-13 VITALS — BP 137/78 | HR 77 | Temp 97.7°F | Ht 65.0 in | Wt 207.0 lb

## 2023-03-13 DIAGNOSIS — Z6834 Body mass index (BMI) 34.0-34.9, adult: Secondary | ICD-10-CM

## 2023-03-13 DIAGNOSIS — Z7985 Long-term (current) use of injectable non-insulin antidiabetic drugs: Secondary | ICD-10-CM

## 2023-03-13 DIAGNOSIS — E66811 Obesity, class 1: Secondary | ICD-10-CM

## 2023-03-13 DIAGNOSIS — E1142 Type 2 diabetes mellitus with diabetic polyneuropathy: Secondary | ICD-10-CM

## 2023-03-13 DIAGNOSIS — E669 Obesity, unspecified: Secondary | ICD-10-CM | POA: Diagnosis not present

## 2023-03-13 DIAGNOSIS — I10 Essential (primary) hypertension: Secondary | ICD-10-CM | POA: Diagnosis not present

## 2023-03-13 DIAGNOSIS — Z794 Long term (current) use of insulin: Secondary | ICD-10-CM | POA: Diagnosis not present

## 2023-03-13 MED ORDER — TRULICITY 0.75 MG/0.5ML ~~LOC~~ SOAJ
SUBCUTANEOUS | 0 refills | Status: DC
Start: 2023-03-13 — End: 2023-04-18

## 2023-03-13 NOTE — Progress Notes (Signed)
WEIGHT SUMMARY AND BIOMETRICS  Weight Lost Since Last Visit: 0  Weight Gained Since Last Visit: 1lb   Vitals Temp: 97.7 F (36.5 C) BP: 137/78 Pulse Rate: 77 SpO2: 99 %   Anthropometric Measurements Height: 5\' 5"  (1.651 m) Weight: 207 lb (93.9 kg) BMI (Calculated): 34.45 Weight at Last Visit: 206lb Weight Lost Since Last Visit: 0 Weight Gained Since Last Visit: 1lb Starting Weight: 246lb Total Weight Loss (lbs): 39 lb (17.7 kg)   Body Composition  Body Fat %: 45.8 % Fat Mass (lbs): 94.8 lbs Muscle Mass (lbs): 106.6 lbs Total Body Water (lbs): 78.6 lbs Visceral Fat Rating : 14   Other Clinical Data Fasting: no Labs: no Today's Visit #: 45 Starting Date: 03/12/19    OBESITY Jessica [name] is here to discuss her progress with her obesity treatment plan along with follow-up of her obesity related diagnoses.    Nutrition Plan: the Category 3 plan - 90% adherence.  Current exercise: walking and cubie  Interim History:  She is getting hungrier in the evening. She is eating the same amount of protein.  Eating all of the food on the plan., Protein intake is as prescribed, Is exceeding snack calorie allotment, and Water intake is adequate.   Pharmacotherapy: Jessica [name] is on Trulicity 0.75 mg SQ weekly Adverse side effects: Nausea, occasionally  Hunger is moderately controlled.  Cravings are moderately controlled.  Assessment/Plan:   1. Type 2 diabetes mellitus with diabetic polyneuropathy, with long-term current use of insulin (HCC)  HgbA1c is at goal. Last A1c was 6.3 CBGs: Not checking      Episodes of hypoglycemia: no Medication(s): Trulicity 0.75 mg SQ weekly  Lab Results  Component Value Date   HGBA1C 6.3 (H) 08/31/2022   HGBA1C 7.2 (A) 06/14/2022   HGBA1C 6.6 (A) 02/08/2022   Lab Results  Component Value Date   MICROALBUR <0.7 10/19/2021    LDLCALC 72 04/12/2022   CREATININE 0.93 09/13/2022   Lab Results  Component Value Date   GFR 57.36 (L) 04/12/2022   GFR 60.62 06/09/2021   GFR 58.47 (L) 04/13/2021    Plan: Continue and refill Trulicity 0.75 mg SQ weekly Continue all other medications.  Will keep all carbohydrates low both sweets and starches.  Will continue exercise regimen to 30 to 60 minutes on most days of the week.  Aim for 7 to 9 hours of sleep nightly.  Eat more low glycemic index foods.  She will weigh her protein portions.   Hypertension Hypertension stable.  Medication(s): Benicar 40 mg once daily   BP Readings from Last 3 Encounters:  03/13/23 137/78  02/07/23 (!) 142/70  01/03/23 (!) 170/73   Lab Results  Component Value Date   CREATININE 0.93 09/13/2022   CREATININE 1.08 (H) 08/31/2022   CREATININE 0.99 04/12/2022   Lab Results  Component Value Date   GFR 57.36 (L) 04/12/2022  GFR 60.62 06/09/2021   GFR 58.47 (L) 04/13/2021    Plan: Continue all antihypertensives at current dosages. No added salt. Will keep sodium content to 1,500 mg or less per day.     Generalized Obesity: Current BMI BMI (Calculated): 34.45   Pharmacotherapy Plan Continue and refill  Trulicity 0.75 mg SQ weekly ( increase in the future, having some GI s/s r/t to ASA and other anti-inflammatories.   Jessica [name] is currently in the action stage of change. As such, her goal is to continue with weight loss efforts.  She has agreed to the Category 3 plan.  Exercise goals: Older adults should do exercises that maintain or improve balance if they are at risk of falling.   Behavioral modification strategies: increasing lean protein intake, no meal skipping, decrease eating out, meal planning , increase water intake, better snacking choices, planning for success, get rid of junk food in the home, weigh protein portions, and measure portion sizes.  Jessica [name] has agreed to follow-up with our clinic in 4 weeks.       Objective:   VITALS: Per patient if applicable, see vitals. GENERAL: Alert and in no acute distress. CARDIOPULMONARY: No increased WOB. Speaking in clear sentences.  PSYCH: Pleasant and cooperative. Speech normal rate and rhythm. Affect is appropriate. Insight and judgement are appropriate. Attention is focused, linear, and appropriate.  NEURO: Oriented as arrived to appointment on time with no prompting.   Attestation Statements:    This was prepared with the assistance of Engineer, civil (consulting).  Occasional wrong-word or sound-a-like substitutions may have occurred due to the inherent limitations of voice recognition

## 2023-03-22 ENCOUNTER — Ambulatory Visit: Payer: Medicare Other

## 2023-03-22 ENCOUNTER — Encounter: Payer: Self-pay | Admitting: Family Medicine

## 2023-03-22 VITALS — Wt 207.0 lb

## 2023-03-22 DIAGNOSIS — Z0000 Encounter for general adult medical examination without abnormal findings: Secondary | ICD-10-CM

## 2023-03-22 NOTE — Patient Instructions (Signed)
Jessica [name] , Thank you for taking time to come for your Medicare Wellness Visit. I appreciate your ongoing commitment to your health goals. Please review the following plan we discussed and let me know if I can assist you in the future.   Referrals/Orders/Follow-Ups/Clinician Recommendations: Aim for 30 minutes of exercise or brisk walking, 6-8 glasses of water, and 5 servings of fruits and vegetables each day. Continue to work on losing weight   This is a list of the screening recommended for you and due dates:  Health Maintenance  Topic Date Due   Eye exam for diabetics  04/07/2022   Complete foot exam   10/20/2022   Flu Shot  11/30/2022   COVID-19 Vaccine (4 - 2023-24 season) 12/31/2022   Hemoglobin A1C  03/03/2023   Yearly kidney health urinalysis for diabetes  10/09/2027*   Mammogram  06/30/2023   Yearly kidney function blood test for diabetes  09/13/2023   Medicare Annual Wellness Visit  03/21/2024   Colon Cancer Screening  10/13/2024   DEXA scan (bone density measurement)  10/25/2025   DTaP/Tdap/Td vaccine (3 - Td or Tdap) 10/26/2027   Pneumonia Vaccine  Completed   Hepatitis C Screening  Completed   Zoster (Shingles) Vaccine  Completed   HPV Vaccine  Aged Out  *Topic was postponed. The date shown is not the original due date.    Advanced directives: (Declined) Advance directive discussed with you today. Even though you declined this today, please call our office should you change your mind, and we can give you the proper paperwork for you to fill out.  Next Medicare Annual Wellness Visit scheduled for next year: Yes

## 2023-03-22 NOTE — Progress Notes (Addendum)
Subjective:   Jessica [name] is a 72 y.o. female who presents for Medicare Annual (Subsequent) preventive examination.  Visit Complete: Virtual I connected with  Jessica [name] on 03/22/23 by a audio enabled telemedicine application and verified that I am speaking with the correct person using two identifiers.  Patient Location: Home  Provider Location: Office/Clinic  I discussed the limitations of evaluation and management by telemedicine. The patient expressed understanding and agreed to proceed.  Vital Signs: Because this visit was a virtual/telehealth visit, some criteria may be missing or patient reported. Any vitals not documented were not able to be obtained and vitals that have been documented are patient reported.   Cardiac Risk Factors include: advanced age (>22men, >27 women);hypertension;dyslipidemia;diabetes mellitus;obesity (BMI >30kg/m2)     Objective:    Today's Vitals   03/22/23 1302  Weight: 207 lb (93.9 kg)   Body mass index is 34.45 kg/m.     03/22/2023    1:04 PM 10/11/2022    1:49 PM 08/31/2022   11:37 AM 03/13/2022    2:14 PM 02/24/2021    2:35 PM 02/19/2020    2:37 PM 01/16/2019    3:24 PM  Advanced Directives  Does Patient Have a Medical Advance Directive? No No No Yes Yes No No  Type of Aeronautical engineer of [place];Living will Healthcare Power of Attorney    Copy of Healthcare Power of Attorney in Chart?    No - copy requested No - copy requested    Would patient like information on creating a medical advance directive? No - Patient declined No - Patient declined Yes (MAU/Ambulatory/Procedural Areas - Information given)    Yes (MAU/Ambulatory/Procedural Areas - Information given)    Current Medications (verified) Outpatient Encounter Medications as of 03/22/2023  Medication Sig   albuterol (VENTOLIN HFA) 108 (90 Base) MCG/ACT inhaler TAKE 2 PUFFS BY MOUTH EVERY 6 HOURS AS NEEDED FOR WHEEZE OR SHORTNESS OF BREATH    atorvastatin (LIPITOR) 40 MG tablet TAKE 1 TABLET BY MOUTH EVERY DAY   celecoxib (CELEBREX) 200 MG capsule Take 1 capsule (200 mg total) by mouth 2 (two) times daily between meals as needed.   Cholecalciferol (VITAMIN D3) 1.25 MG (50000 UT) CAPS Take 50,000 Units by mouth every 30 (thirty) days.   dexlansoprazole (DEXILANT) 60 MG capsule TAKE 1 CAPSULE BY MOUTH EVERY DAY   Dulaglutide (TRULICITY) 0.75 MG/0.5ML SOAJ Inject into the skin weekly   furosemide (LASIX) 20 MG tablet TAKE 1 TABLET (20 MG TOTAL) BY MOUTH DAILY AS NEEDED FOR FLUID OR EDEMA. (Patient taking differently: Take 20 mg by mouth daily.)   gabapentin (NEURONTIN) 600 MG tablet Take one tablet in the morning and two tablets before bedtime   glucose blood (ONETOUCH ULTRA) test strip USE 2 TIMES DAILY. AND LANCETS 2/DAY   insulin glargine (LANTUS SOLOSTAR) 100 UNIT/ML Solostar Pen INJECT 30 UNITS INTO THE SKIN AT BEDTIME.   insulin lispro (HUMALOG KWIKPEN) 100 UNIT/ML KwikPen INJECT 5-9 UNITS INTO THE SKIN 2 (TWO) TIMES DAILY BEFORE A MEAL.   Insulin Pen Needle 32G X 4 MM MISC Use 3-4x a day   Insulin Syringe-Needle U-100 (BD INSULIN SYRINGE U/F) 31G X 5/16" 1 ML MISC USE UP TO 3 TIMES A DAY   ketoconazole (NIZORAL) 2 % cream APPLY TWICE A DAY FOR 2 WEEKS AS NEEDED FOR RASH   olmesartan (BENICAR) 40 MG tablet TAKE 1 TABLET BY MOUTH EVERY DAY   ondansetron (ZOFRAN ODT) 4 MG disintegrating  tablet Take 1 tablet (4 mg total) by mouth every 8 (eight) hours as needed for nausea or vomiting.   polyethylene glycol powder ([employer] PURELAX) 17 GM/SCOOP powder Take 17 g by mouth at bedtime.   sucralfate (CARAFATE) 1 GM/10ML suspension Take 10 mLs (1 g total) by mouth daily as needed (nausea).   valACYclovir (VALTREX) 500 MG tablet Take 1 tablet (500 mg total) by mouth daily.   No facility-administered encounter medications on file as of 03/22/2023.    Allergies (verified) Codeine, Gluten meal, Metronidazole, Nsaids, Soy allergy, and Wheat    History: Past Medical History:  Diagnosis Date   Anxiety    Arthritis    bilateral knees   Asthma    childhood, inhaler for wheezing PRN   Autonomic neuropathy    diabetic   Cataract    Celiac disease    Constipation    Depression    Diabetes mellitus    type II, Hemoglobin A1C 9.9 10/05/2011   Diabetic neuropathy (HCC)    Diverticulosis    Fatty liver    Gastroparesis    GERD (gastroesophageal reflux disease)    History of claustrophobia    with MRI tests   Hyperlipidemia    Hypertension    IBS (irritable bowel syndrome)    Kidney stones    Personal history of colonic polyps 03/2010   hyperplastic   PONV (postoperative nausea and vomiting)    Shingles 2021   Past Surgical History:  Procedure Laterality Date   APPENDECTOMY     CATARACT EXTRACTION Right 04/29/2018   CATARACT EXTRACTION Left 03/2018   COLONOSCOPY     DILATATION & CURRETTAGE/HYSTEROSCOPY WITH RESECTOCOPE N/A 03/24/2013   Procedure: DILATATION & CURETTAGE, HYSTEROSCOPY WITH RESECTION;  Surgeon: Levi Aland, MD;  Location: [facility];  Service: Gynecology;  Laterality: N/A;   HYSTEROSCOPY WITH D & C N/A 11/08/2015   Procedure: DILATATION AND CURETTAGE /HYSTEROSCOPY;  Surgeon: Levi Aland, MD;  Location: [facility];  Service: Gynecology;  Laterality: N/A;   left breast cyst removal  2000   LEFT HEART CATH AND CORONARY ANGIOGRAPHY N/A 10/31/2018   Procedure: LEFT HEART CATH AND CORONARY ANGIOGRAPHY;  Surgeon: Corky Crafts, MD;  Location: [facility] INVASIVE CV LAB;  Service: Cardiovascular;  Laterality: N/A;   TOTAL KNEE ARTHROPLASTY Right 09/12/2022   Procedure: RIGHT TOTAL KNEE ARTHROPLASTY;  Surgeon: Kathryne Hitch, MD;  Location: [facility] OR;  Service: Orthopedics;  Laterality: Right;   TUBAL LIGATION     Family History  Problem Relation Age of Onset   Hypertension Mother    Colon cancer Sister        dx in her early 20s   Diabetes Sister    Endometrial cancer Sister    Hypertension Brother     Other Father        2 collapsed lungs   COPD Father    Diabetes Father    Heart attack Father    Heart failure Father    High blood pressure Father    Colon polyps Neg Hx    Esophageal cancer Neg Hx    Stomach cancer Neg Hx    Social History   Socioeconomic History   Marital status: Married    Spouse name: Devar Ronan   Number of children: 2   Years of education: Not on file   Highest education level: Not on file  Occupational History   Occupation: Conservation officer, nature at [employer]    Employer: RETIRED    Comment: parttime  Occupation: retired  Tobacco Use   Smoking status: Never   Smokeless tobacco: Never  Vaping Use   Vaping status: Never Used  Substance and Sexual Activity   Alcohol use: No   Drug use: No   Sexual activity: Yes    Birth control/protection: Post-menopausal, Surgical  Other Topics Concern   Not on file  Social History Narrative   Not on file   Social Determinants of Health   Financial Resource Strain: Low Risk  (03/22/2023)   Overall Financial Resource Strain (CARDIA)    Difficulty of Paying Living Expenses: Not hard at all  Food Insecurity: No Food Insecurity (03/22/2023)   Hunger Vital Sign    Worried About Running Out of Food in the Last Year: Never true    Ran Out of Food in the Last Year: Never true  Transportation Needs: No Transportation Needs (03/22/2023)   PRAPARE - Administrator, Civil Service (Medical): No    Lack of Transportation (Non-Medical): No  Physical Activity: Sufficiently Active (03/22/2023)   Exercise Vital Sign    Days of Exercise per Week: 5 days    Minutes of Exercise per Session: 30 min  Stress: Stress Concern Present (03/22/2023)   [employer] of Occupational Health - Occupational Stress Questionnaire    Feeling of Stress : Rather much  Social Connections: Socially Integrated (03/22/2023)   Social Connection and Isolation Panel [NHANES]    Frequency of Communication with Friends and Family:  More than three times a week    Frequency of Social Gatherings with Friends and Family: Twice a week    Attends Religious Services: More than 4 times per year    Active Member of [employer] or Organizations: Yes    Attends Banker Meetings: 1 to 4 times per year    Marital Status: Married    Tobacco Counseling Counseling given: Not Answered   Clinical Intake:  Pre-visit preparation completed: Yes  Pain : No/denies pain     BMI - recorded: 34.45 Nutritional Status: BMI > 30  Obese Nutritional Risks: None Diabetes: Yes CBG done?: No Did pt. bring in CBG monitor from home?: No  How often do you need to have someone help you when you read instructions, pamphlets, or other written materials from your doctor or pharmacy?: 1 - Never  Interpreter Needed?: No  Information entered by :: Lanier Ensign, LPN   Activities of Daily Living    03/22/2023    1:03 PM 08/31/2022   11:41 AM  In your present state of health, do you have any difficulty performing the following activities:  Hearing? 0   Vision? 0   Difficulty concentrating or making decisions? 0   Walking or climbing stairs? 0   Dressing or bathing? 0   Doing errands, shopping? 0 0  Preparing Food and eating ? N   Using the Toilet? N   In the past six months, have you accidently leaked urine? N   Do you have problems with loss of bowel control? N   Managing your Medications? N   Managing your Finances? N   Housekeeping or managing your Housekeeping? N     Patient Care Team: Willow Ora, MD as PCP - General (Family Medicine) Corky Crafts, MD as PCP - Cardiology (Cardiology) Levi Aland, MD as Attending Physician (Obstetrics and Gynecology) Romero Belling, MD (Inactive) as Consulting Physician (Endocrinology) Pyrtle, Carie Caddy, MD as Consulting Physician (Gastroenterology) Rodrigo Ran, OD as Consulting Physician (Ophthalmology) [place], [place]  T, DPM as Consulting Physician (Podiatry) Earlene Plater,  Rita Ohara, [facility] (Inactive) as Pharmacist (Pharmacist)  Indicate any recent Medical Services you may have received from other than [facility] providers in the past year (date may be approximate).     Assessment:   This is a routine wellness examination for Jessica [name].  Hearing/Vision screen Hearing Screening - Comments:: Pt denies any hearing issues  Vision Screening - Comments:: Pt follows up with Dr Lynelle Doctor for annual eye exams    Goals Addressed             This Visit's Progress    Patient Stated       Keep weight down        Depression Screen    03/22/2023    1:07 PM 10/09/2022    3:47 PM 04/12/2022    1:02 PM 03/13/2022    2:12 PM 12/05/2021    1:38 PM 10/19/2021    1:43 PM 04/13/2021   11:42 AM  PHQ 2/9 Scores  PHQ - 2 Score 0 0 0 1 0 3 3  PHQ- 9 Score     0 9 10    Fall Risk    03/22/2023    1:11 PM 10/09/2022    3:47 PM 04/12/2022    1:02 PM 03/13/2022    2:16 PM 12/05/2021    1:38 PM  Fall Risk   Falls in the past year? 0 0 0 0 0  Number falls in past yr: 0 0 0 0 0  Injury with Fall? 0 0 0 0 0  Risk for fall due to : Impaired mobility History of fall(s) No Fall Risks No Fall Risks;Impaired mobility No Fall Risks  Follow up Falls prevention discussed Falls evaluation completed Falls evaluation completed Falls prevention discussed Falls evaluation completed    MEDICARE RISK AT HOME: Medicare Risk at Home Any stairs in or around the home?: Yes If so, are there any without handrails?: No Home free of loose throw rugs in walkways, pet beds, electrical cords, etc?: Yes Adequate lighting in your home to reduce risk of falls?: Yes Life alert?: No Use of a cane, walker or w/c?: Yes Grab bars in the bathroom?: Yes Shower chair or bench in shower?: Yes Elevated toilet seat or a handicapped toilet?: Yes  TIMED UP AND GO:  Was the test performed?  No    Cognitive Function:    03/22/2023    1:12 PM  MMSE - Mini Mental State Exam  Not completed: Refused         03/13/2022    2:17 PM 02/24/2021    2:39 PM 02/19/2020    2:43 PM 01/16/2019    3:25 PM  6CIT Screen  What Year? 0 points 0 points 0 points 0 points  What month? 0 points 0 points 0 points 0 points  What time? 0 points 0 points  0 points  Count back from 20 0 points 0 points 0 points 0 points  Months in reverse 0 points 0 points 0 points 0 points  Repeat phrase 0 points 0 points 4 points 0 points  Total Score 0 points 0 points  0 points    Immunizations Immunization History  Administered Date(s) Administered   Fluad Quad(high Dose 65+) 01/16/2019, 01/20/2020, 04/12/2022   Influenza,inj,Quad PF,6+ Mos 02/14/2018   Influenza-Unspecified 12/30/2020   PFIZER Comirnaty(Gray Top)Covid-19 Tri-Sucrose Vaccine 09/02/2020   PFIZER(Purple Top)SARS-COV-2 Vaccination 07/03/2019, 08/06/2019   Pneumococcal Conjugate-13 11/03/2015   Pneumococcal Polysaccharide-23 08/17/2010, 02/14/2018   Td 12/31/2002  Tdap 10/25/2017   Zoster Recombinant(Shingrix) 11/05/2018, 01/28/2019    TDAP status: Up to date  Flu Vaccine status: Due, Education has been provided regarding the importance of this vaccine. Advised may receive this vaccine at local pharmacy or Health Dept. Aware to provide a copy of the vaccination record if obtained from local pharmacy or Health Dept. Verbalized acceptance and understanding.  Pneumococcal vaccine status: Up to date  Covid-19 vaccine status: Information provided on how to obtain vaccines.   Qualifies for Shingles Vaccine? Yes   Zostavax completed Yes   Shingrix Completed?: Yes  Screening Tests Health Maintenance  Topic Date Due   OPHTHALMOLOGY EXAM  04/07/2022   FOOT EXAM  10/20/2022   COVID-19 Vaccine (4 - 2023-24 season) 12/31/2022   HEMOGLOBIN A1C  03/03/2023   INFLUENZA VACCINE  07/30/2023 (Originally 11/30/2022)   Diabetic kidney evaluation - Urine ACR  10/09/2027 (Originally 10/20/2022)   MAMMOGRAM  06/30/2023   Diabetic kidney evaluation - eGFR measurement   09/13/2023   Medicare Annual Wellness (AWV)  03/21/2024   Colonoscopy  10/13/2024   DEXA SCAN  10/25/2025   DTaP/Tdap/Td (3 - Td or Tdap) 10/26/2027   Pneumonia Vaccine 48+ Years old  Completed   Hepatitis C Screening  Completed   Zoster Vaccines- Shingrix  Completed   HPV VACCINES  Aged Out    Health Maintenance  Health Maintenance Due  Topic Date Due   OPHTHALMOLOGY EXAM  04/07/2022   FOOT EXAM  10/20/2022   COVID-19 Vaccine (4 - 2023-24 season) 12/31/2022   HEMOGLOBIN A1C  03/03/2023    Colorectal cancer screening: Type of screening: Colonoscopy. Completed 10/14/19. Repeat every 5 years  Mammogram status: Completed 06/30/22. Repeat every year  Bone Density status: Completed 10/25/20. Results reflect: Bone density results: NORMAL. Repeat every 5 years.   Additional Screening:  Hepatitis C Screening:  Completed 11/03/15  Vision Screening: Recommended annual ophthalmology exams for early detection of glaucoma and other disorders of the eye. Is the patient up to date with their annual eye exam?  No  Who is the provider or what is the name of the office in which the patient attends annual eye exams? Dr Lynelle Doctor  If pt is not established with a provider, would they like to be referred to a provider to establish care? No .   Dental Screening: Recommended annual dental exams for proper oral hygiene  Diabetic Foot Exam: Diabetic Foot Exam: Overdue, Pt has been advised about the importance in completing this exam. Pt is scheduled for diabetic foot exam on next appt .  Community Resource Referral / Chronic Care Management: CRR required this visit?  No   CCM required this visit?  No     Plan:     I have personally reviewed and noted the following in the patient's chart:   Medical and social history Use of alcohol, tobacco or illicit drugs  Current medications and supplements including opioid prescriptions. Patient is not currently taking opioid prescriptions. Functional  ability and status Nutritional status Physical activity Advanced directives List of other physicians Hospitalizations, surgeries, and ER visits in previous 12 months Vitals Screenings to include cognitive, depression, and falls Referrals and appointments  In addition, I have reviewed and discussed with patient certain preventive protocols, quality metrics, and best practice recommendations. A written personalized care plan for preventive services as well as general preventive health recommendations were provided to patient.     Marzella Schlein, LPN   [id number]/[id number]   After Visit Summary: (MyChart) Due  to this being a telephonic visit, the after visit summary with patients personalized plan was offered to patient via MyChart   Nurse Notes: pt stated she is unable to sleep and believes she has shingles, pt stated she will send a my chart message along with a picture before making an appt

## 2023-03-27 ENCOUNTER — Encounter: Payer: Self-pay | Admitting: Family

## 2023-03-27 ENCOUNTER — Ambulatory Visit (INDEPENDENT_AMBULATORY_CARE_PROVIDER_SITE_OTHER): Payer: Medicare Other | Admitting: Family

## 2023-03-27 VITALS — Temp 98.0°F | Ht 65.0 in | Wt 214.0 lb

## 2023-03-27 DIAGNOSIS — B029 Zoster without complications: Secondary | ICD-10-CM

## 2023-03-27 MED ORDER — VALACYCLOVIR HCL 1 G PO TABS
1000.0000 mg | ORAL_TABLET | Freq: Three times a day (TID) | ORAL | 0 refills | Status: DC
Start: 2023-03-27 — End: 2023-04-17

## 2023-03-27 NOTE — Progress Notes (Signed)
Patient ID: Jessica [name], female    DOB: March 14, 1951, 72 y.o.   MRN: [id number]  Chief Complaint  Patient presents with   Rash    Pt c/o rash on right lower back side, present since last Thursday. Rash is painful and itchy. Has tried benadryl cream.    Discussed the use of AI scribe software for clinical note transcription with the patient, who gave verbal consent to proceed.  History of Present Illness   The patient, with a history of shingles and knee replacement, presents with a recurring rash, normally located on her upper back & shoulders, but today is located on her right side and lower back. The rash is itchy and painful, similar to her previous experience with shingles. She has been managing the itchiness with Benadryl cream. The pain has eased off slightly, but she still experiences discomfort when lying on her side. She also mentions a spot on her neck that has been itching. She has been taking valacyclovir daily for a very long time, but is unsure why exactly, she denies having hx of Herpes 1 or 2, but was first dx with shingles in 2020. It resolved and she received both Shingrix vaccines soon after, but the rash keeps recurring.     Assessment & Plan:     Recurrent Shingles - Patient presents with a painful, itchy rash on her right lower flank/back, similar to previous episodes. The rash is not typical in appearance for shingles, but the symptoms are consistent with the diagnosis. Most likely the daily low dose Valtrex is keeping rash sx minimal, possibly not presenting in normal fashion. -Increase valacyclovir to a therapeutic dose for one week to treat the current outbreak. -Discontinue the daily prophylactic valacyclovir during this week. -Ok to continue the topical Benadryl cream for sx relief.  -Recommend f/u with PCP and possible revaccination series. -RTO precautions provided.    Subjective:    Outpatient Medications Prior to Visit  Medication Sig Dispense Refill    albuterol (VENTOLIN HFA) 108 (90 Base) MCG/ACT inhaler TAKE 2 PUFFS BY MOUTH EVERY 6 HOURS AS NEEDED FOR WHEEZE OR SHORTNESS OF BREATH 6.7 each 3   atorvastatin (LIPITOR) 40 MG tablet TAKE 1 TABLET BY MOUTH EVERY DAY 90 tablet 3   celecoxib (CELEBREX) 200 MG capsule Take 1 capsule (200 mg total) by mouth 2 (two) times daily between meals as needed. 60 capsule 1   Cholecalciferol (VITAMIN D3) 1.25 MG (50000 UT) CAPS Take 50,000 Units by mouth every 30 (thirty) days.     dexlansoprazole (DEXILANT) 60 MG capsule TAKE 1 CAPSULE BY MOUTH EVERY DAY 90 capsule 3   Dulaglutide (TRULICITY) 0.75 MG/0.5ML SOAJ Inject into the skin weekly 2 mL 0   furosemide (LASIX) 20 MG tablet TAKE 1 TABLET (20 MG TOTAL) BY MOUTH DAILY AS NEEDED FOR FLUID OR EDEMA. (Patient taking differently: Take 20 mg by mouth daily.) 90 tablet 3   gabapentin (NEURONTIN) 600 MG tablet Take one tablet in the morning and two tablets before bedtime 270 tablet 2   glucose blood (ONETOUCH ULTRA) test strip USE 2 TIMES DAILY. AND LANCETS 2/DAY 200 strip 3   insulin glargine (LANTUS SOLOSTAR) 100 UNIT/ML Solostar Pen INJECT 30 UNITS INTO THE SKIN AT BEDTIME. 15 mL 0   insulin lispro (HUMALOG KWIKPEN) 100 UNIT/ML KwikPen INJECT 5-9 UNITS INTO THE SKIN 2 (TWO) TIMES DAILY BEFORE A MEAL. 30 mL 1   Insulin Pen Needle 32G X 4 MM MISC Use 3-4x a day 300  each 3   Insulin Syringe-Needle U-100 (BD INSULIN SYRINGE U/F) 31G X 5/16" 1 ML MISC USE UP TO 3 TIMES A DAY 100 each 5   ketoconazole (NIZORAL) 2 % cream APPLY TWICE A DAY FOR 2 WEEKS AS NEEDED FOR RASH 30 g 2   olmesartan (BENICAR) 40 MG tablet TAKE 1 TABLET BY MOUTH EVERY DAY 90 tablet 3   ondansetron (ZOFRAN ODT) 4 MG disintegrating tablet Take 1 tablet (4 mg total) by mouth every 8 (eight) hours as needed for nausea or vomiting. 30 tablet 0   polyethylene glycol powder ([employer] PURELAX) 17 GM/SCOOP powder Take 17 g by mouth at bedtime.     sucralfate (CARAFATE) 1 GM/10ML suspension Take 10 mLs (1 g  total) by mouth daily as needed (nausea).     valACYclovir (VALTREX) 500 MG tablet Take 1 tablet (500 mg total) by mouth daily. 90 tablet 3   No facility-administered medications prior to visit.   Past Medical History:  Diagnosis Date   Anxiety    Arthritis    bilateral knees   Asthma    childhood, inhaler for wheezing PRN   Autonomic neuropathy    diabetic   Cataract    Celiac disease    Constipation    Depression    Diabetes mellitus    type II, Hemoglobin A1C 9.9 10/05/2011   Diabetic neuropathy (HCC)    Diverticulosis    Fatty liver    Gastroparesis    GERD (gastroesophageal reflux disease)    History of claustrophobia    with MRI tests   Hyperlipidemia    Hypertension    IBS (irritable bowel syndrome)    Kidney stones    Personal history of colonic polyps 03/2010   hyperplastic   PONV (postoperative nausea and vomiting)    Shingles 2021   Past Surgical History:  Procedure Laterality Date   APPENDECTOMY     CATARACT EXTRACTION Right 04/29/2018   CATARACT EXTRACTION Left 03/2018   COLONOSCOPY     DILATATION & CURRETTAGE/HYSTEROSCOPY WITH RESECTOCOPE N/A 03/24/2013   Procedure: DILATATION & CURETTAGE, HYSTEROSCOPY WITH RESECTION;  Surgeon: Levi Aland, MD;  Location: [facility];  Service: Gynecology;  Laterality: N/A;   HYSTEROSCOPY WITH D & C N/A 11/08/2015   Procedure: DILATATION AND CURETTAGE /HYSTEROSCOPY;  Surgeon: Levi Aland, MD;  Location: [facility];  Service: Gynecology;  Laterality: N/A;   left breast cyst removal  2000   LEFT HEART CATH AND CORONARY ANGIOGRAPHY N/A 10/31/2018   Procedure: LEFT HEART CATH AND CORONARY ANGIOGRAPHY;  Surgeon: Corky Crafts, MD;  Location: [facility];  Service: Cardiovascular;  Laterality: N/A;   TOTAL KNEE ARTHROPLASTY Right 09/12/2022   Procedure: RIGHT TOTAL KNEE ARTHROPLASTY;  Surgeon: Kathryne Hitch, MD;  Location: [facility];  Service: Orthopedics;  Laterality: Right;   TUBAL LIGATION     Allergies   Allergen Reactions   Codeine Nausea And Vomiting   Gluten Meal Other (See Comments)    No wheat or soy   Metronidazole Nausea And Vomiting   Nsaids Nausea And Vomiting and Other (See Comments)    Cannot tolerate large doses d/t Celiac disease    Soy Allergy    Wheat       Objective:    Physical Exam Vitals and nursing note reviewed.  Constitutional:      Appearance: Normal appearance.  Cardiovascular:     Rate and Rhythm: Normal rate and regular rhythm.  Pulmonary:     Effort:  Pulmonary effort is normal.     Breath sounds: Normal breath sounds.  Musculoskeletal:        General: Normal range of motion.  Skin:    General: Skin is warm and dry.     Findings: Rash (4-5 dime size slightly raised red bumps, no excoriation or oozing noted, right lower back/flank area) present.       Neurological:     Mental Status: She is alert.  Psychiatric:        Mood and Affect: Mood normal.        Behavior: Behavior normal.    Temp 98 F (36.7 C) (Temporal)   Ht 5\' 5"  (1.651 m)   Wt 214 lb (97.1 kg)   BMI 35.61 kg/m  Wt Readings from Last 3 Encounters:  03/27/23 214 lb (97.1 kg)  03/22/23 207 lb (93.9 kg)  03/13/23 207 lb (93.9 kg)       Dulce Sellar, NP

## 2023-03-30 ENCOUNTER — Other Ambulatory Visit: Payer: Self-pay | Admitting: Family Medicine

## 2023-03-31 ENCOUNTER — Other Ambulatory Visit: Payer: Self-pay | Admitting: Internal Medicine

## 2023-03-31 DIAGNOSIS — Z794 Long term (current) use of insulin: Secondary | ICD-10-CM

## 2023-04-09 ENCOUNTER — Encounter: Payer: Self-pay | Admitting: Bariatrics

## 2023-04-09 ENCOUNTER — Other Ambulatory Visit: Payer: Self-pay | Admitting: Bariatrics

## 2023-04-09 DIAGNOSIS — Z794 Long term (current) use of insulin: Secondary | ICD-10-CM

## 2023-04-10 ENCOUNTER — Encounter: Payer: Self-pay | Admitting: Internal Medicine

## 2023-04-10 ENCOUNTER — Ambulatory Visit (INDEPENDENT_AMBULATORY_CARE_PROVIDER_SITE_OTHER): Payer: Medicare Other | Admitting: Internal Medicine

## 2023-04-10 VITALS — BP 140/70 | HR 70 | Ht 65.0 in | Wt 216.8 lb

## 2023-04-10 DIAGNOSIS — E1165 Type 2 diabetes mellitus with hyperglycemia: Secondary | ICD-10-CM

## 2023-04-10 DIAGNOSIS — Z7985 Long-term (current) use of injectable non-insulin antidiabetic drugs: Secondary | ICD-10-CM | POA: Diagnosis not present

## 2023-04-10 DIAGNOSIS — E042 Nontoxic multinodular goiter: Secondary | ICD-10-CM | POA: Diagnosis not present

## 2023-04-10 DIAGNOSIS — E1169 Type 2 diabetes mellitus with other specified complication: Secondary | ICD-10-CM

## 2023-04-10 DIAGNOSIS — Z794 Long term (current) use of insulin: Secondary | ICD-10-CM

## 2023-04-10 DIAGNOSIS — E782 Mixed hyperlipidemia: Secondary | ICD-10-CM

## 2023-04-10 DIAGNOSIS — E1142 Type 2 diabetes mellitus with diabetic polyneuropathy: Secondary | ICD-10-CM

## 2023-04-10 LAB — POCT GLYCOSYLATED HEMOGLOBIN (HGB A1C): Hemoglobin A1C: 6.1 % — AB (ref 4.0–5.6)

## 2023-04-10 NOTE — Patient Instructions (Addendum)
Please decrease: - Lantus 25 units  at night  Continue: - Humalog 5-7 units 2x a day 15 min before meals - Trulicity 0.75 mg weekly   Please schedule another appt. with podiatry.  Please schedule a new thyroid U/S.  Please return in 4 months with your sugar log.

## 2023-04-10 NOTE — Addendum Note (Signed)
Addended by: Pollie Meyer on: 04/10/2023 04:42 PM   Modules accepted: Orders

## 2023-04-10 NOTE — Progress Notes (Addendum)
Patient ID: Jessica [name], female   DOB: January 07, 1951, 72 y.o.   MRN: [id number]  HPI: Jessica [name] is a 72 y.o.-year-old female, returning for follow-up for DM2, dx in 2000, insulin-dependent since 2005, uncontrolled, with complications (DR, PN, gastroparesis, autonomic neuropathy). Pt. previously saw Dr. Everardo All, but last visit with me 10 months ago.  Interim history: No increased urination, blurry vision, nausea, chest pain. She now sees the weight management clinic. She had a R TKR in 08/2022. Still has pain and also some weakness R upper thigh.  DM2: Reviewed HbA1c: Lab Results  Component Value Date   HGBA1C 6.3 (H) 08/31/2022   HGBA1C 7.2 (A) 06/14/2022   HGBA1C 6.6 (A) 02/08/2022   HGBA1C 6.3 (A) 10/19/2021   HGBA1C 7.7 (A) 04/13/2021   HGBA1C 8.2 (A) 01/12/2021   HGBA1C 8.1 (A) 08/12/2020   HGBA1C 7.8 (A) 05/14/2020   HGBA1C 7.1 (A) 12/31/2019   HGBA1C 6.9 (A) 10/28/2019   At last visit she was on: - Humulin 70/30 40 >> 32 units after b'fast  >> before b'fastand 8 >> 10 >> 14 units before dinner  - Trulicity 0.75 mg weekly - adjusted by Weight Loss Specialist (on nausea med)- could not tolerate a higher dose She had nausea and stomach pain with Ozempic.  We changed to: - Lantus 30 units  at night - Fiasp  >> Humalog: 7-9 >> 5-7 units before b'fast 5-7 units before dinner - Trulicity 0.75 mg weekly  - cannot tolerate higher doses  Pt checks her sugars 0-1x a day and they are: - am: 120-180 >> 150-160 >> 64-110, 121 - 2h after b'fast: n/c - before lunch: n/c >> 74, 90-100 - 2h after lunch: n/c >> 320 >> n/c - before dinner: 100-125 >> 110-120 >> 120 - 2h after dinner: n/c >> <130 >> n/c - bedtime: n/c >> 64 x1 >> 2 lows in last 2 mo: 54 - nighttime: n/c Lowest sugar was 77 >> 64>> 54; she has hypoglycemia awareness at 100.  Highest sugar was 180 >> 320 >> 250 (carbs, starches).  Glucometer: One Touch ultra  She goes to the weight management clinic.  - no  CKD, last BUN/creatinine:  Lab Results  Component Value Date   BUN 15 09/13/2022   BUN 15 08/31/2022   CREATININE 0.93 09/13/2022   CREATININE 1.08 (H) 08/31/2022   Lab Results  Component Value Date   MICRALBCREAT 0.8 10/19/2021   MICRALBCREAT <14 03/11/2020   MICRALBCREAT <3 03/12/2019   MICRALBCREAT 0.5 10/25/2017   MICRALBCREAT 0.4 10/02/2016   MICRALBCREAT 2.4 11/03/2015   MICRALBCREAT 0.4 05/21/2013   MICRALBCREAT 3.6 04/04/2012   MICRALBCREAT 3.6 01/14/2009   MICRALBCREAT 8.5 04/01/2008  She is on olmesartan 40 mg daily.  -+ HL; last set of lipids: Lab Results  Component Value Date   CHOL 145 04/12/2022   HDL 54.40 04/12/2022   LDLCALC 72 04/12/2022   LDLDIRECT 143.2 11/23/2006   TRIG 93.0 04/12/2022   CHOLHDL 3 04/12/2022  She is on Lipitor 40 mg daily.  - last eye exam was  05/2022: + DR reportedly. + DR in 2021.   - + numbness and tingling in her feet.  On Neurontin 600 mg in a.m. and 1200 mg at night-refilled by podiatry.  MNG:  Thyroid U/S (11/18/2019): Parenchymal Echotexture: Mildly heterogenous  Isthmus: 0.5 cm  Right lobe: 7.0 cm x 1.7 cm x 1.7 cm  Left lobe: 4.5 cm x 1.3 cm x 1.5 cm _________________________________________________________   Estimated  total number of nodules >/= 1 cm: 3 _________________________________________________________   Nodule # 1:  Location: Isthmus; lower  Maximum size: 1.2 cm; Other 2 dimensions: 1.1 cm x 1.1 cm  Composition: cannot determine (2)  Echogenicity: isoechoic (1) Nodule does not meet criteria for surveillance or biopsy   _________________________________________________________   Nodule # 2:  Location: Right; Mid  Maximum size: 1.3 cm; Other 2 dimensions: 1.1 cm x 0.8 cm  Composition: cannot determine (2)  Echogenicity: isoechoic (1) Nodule does not meet criteria for surveillance or biopsy  _________________________________________________________   Nodule # 3:   Location: Right; Inferior   Maximum size: 1.9 cm; Other 2 dimensions: 1.6 cm x 1.2 cm  Composition: cannot determine (2)  Echogenicity: isoechoic (1) Nodule meets criteria for surveillance _________________________________________________________   No adenopathy   IMPRESSION: Multinodular thyroid.   Right inferior thyroid nodule (labeled 3, TR 3, 1.9 cm) meets criteria for surveillance, as designated by the newly established ACR TI-RADS criteria. Surveillance ultrasound study recommended to be performed annually up to 5 years.   She remembers having had a thyroid Bx by Dr. Everardo All >> reportedly benign.  Thyroid U/S (02/17/2022): Parenchymal Echotexture: Mildly heterogenous  Isthmus: 1.3 cm  Right lobe: 5.2 cm x 1.4 cm x 1.7 cm  Left lobe: 4.9 cm x 1.1 cm x 1.5 cm  _________________________________________________________   Estimated total number of nodules >/= 1 cm: 2 _________________________________________________________   Nodule labeled 1 in the isthmus, 1.2 cm, unchanged in size. Nodule remains TR 3 and meets criteria for surveillance.   Nodule labeled 2 right lower thyroid, slightly smaller now measuring 1.1 cm. Nodule has spongiform characteristics on the current ultrasound and does not meet criteria for further surveillance.   No adenopathy   IMPRESSION: Isthmic thyroid nodule (labeled 1, 1.2 cm, TR 3) meets criteria for surveillance, as designated by the newly established ACR TI-RADS criteria. Surveillance ultrasound study recommended to be performed annually up to 5 years, which would be July 2026.  TSH levels have been normal: Lab Results  Component Value Date   TSH 1.04 04/12/2022   TSH 0.91 04/13/2021   TSH 1.320 09/07/2020   TSH 2.15 10/16/2018   TSH 1.27 10/25/2017   TSH 1.46 10/02/2016   TSH 1.55 11/03/2015   TSH 0.74 05/21/2013   TSH 1.00 11/13/2012   TSH 1.680 04/04/2012   TSH 1.43 08/10/2010   TSH 1.10 04/01/2008   TSH 1.24 11/23/2006   Pt denies: - feeling  nodules in neck - hoarseness - dysphagia - choking  ROS: + see HPI  Past Medical History:  Diagnosis Date   Anxiety    Arthritis    bilateral knees   Asthma    childhood, inhaler for wheezing PRN   Autonomic neuropathy    diabetic   Cataract    Celiac disease    Constipation    Depression    Diabetes mellitus    type II, Hemoglobin A1C 9.9 10/05/2011   Diabetic neuropathy (HCC)    Diverticulosis    Fatty liver    Gastroparesis    GERD (gastroesophageal reflux disease)    History of claustrophobia    with MRI tests   Hyperlipidemia    Hypertension    IBS (irritable bowel syndrome)    Kidney stones    Personal history of colonic polyps 03/2010   hyperplastic   PONV (postoperative nausea and vomiting)    Shingles 2021   Past Surgical History:  Procedure Laterality Date   APPENDECTOMY  CATARACT EXTRACTION Right 04/29/2018   CATARACT EXTRACTION Left 03/2018   COLONOSCOPY     DILATATION & CURRETTAGE/HYSTEROSCOPY WITH RESECTOCOPE N/A 03/24/2013   Procedure: DILATATION & CURETTAGE, HYSTEROSCOPY WITH RESECTION;  Surgeon: Levi Aland, MD;  Location: [facility];  Service: Gynecology;  Laterality: N/A;   HYSTEROSCOPY WITH D & C N/A 11/08/2015   Procedure: DILATATION AND CURETTAGE /HYSTEROSCOPY;  Surgeon: Levi Aland, MD;  Location: [facility];  Service: Gynecology;  Laterality: N/A;   left breast cyst removal  2000   LEFT HEART CATH AND CORONARY ANGIOGRAPHY N/A 10/31/2018   Procedure: LEFT HEART CATH AND CORONARY ANGIOGRAPHY;  Surgeon: Corky Crafts, MD;  Location[facility] INV[facility] CV LAB;  Service: Cardiovascular;  Laterality: N/A;   TOTAL KNEE ARTHROPLASTY Right 09/12/2022   Procedure: RIGHT TOTAL KNEE ARTHROPLASTY;  Surgeon: Kathryne Hitch, MD;  Location: [facility] OR;  Service: Orthopedics;  Laterality: Right;   TUBAL LIGATION     Social History   Socioeconomic History   Marital status: Married    Spouse name: Devar Helser   Number of children: 2   Years of  education: Not on file   Highest education level: Not on file  Occupational History   Occupation: Conservation officer, 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    Comment: parttime   Occupation: retired  Tobacco Use   Smoking status: Never   Smokeless tobacco: Never  Vaping Use   Vaping status: Never Used  Substance and Sexual Activity   Alcohol use: No   Drug use: No   Sexual activity: Yes    Birth control/protection: Post-menopausal, Surgical  Other Topics Concern   Not on file  Social History Narrative   Not on file   Social Determinants of Health   Financial Resource Strain: Low Risk  (03/22/2023)   Overall Financial Resource Strain (CARDIA)    Difficulty of Paying Living Expenses: Not hard at all  Food Insecurity: No Food Insecurity (03/22/2023)   Hunger Vital Sign    Worried About Running Out of Food in the Last Year: Never true    Ran Out of Food in the Last Year: Never true  Transportation Needs: No Transportation Needs (03/22/2023)   PRAPARE - Administrator, Civil Service (Medical): No    Lack of Transportation (Non-Medical): No  Physical Activity: Sufficiently Active (03/22/2023)   Exercise Vital Sign    Days of Exercise per Week: 5 days    Minutes of Exercise per Session: 30 min  Stress: Stress Concern Present (03/22/2023)   [employer] of Occupational Health - Occupational Stress Questionnaire    Feeling of Stress : Rather much  Social Connections: Socially Integrated (03/22/2023)   Social Connection and Isolation Panel [NHANES]    Frequency of Communication with Friends and Family: More than three times a week    Frequency of Social Gatherings with Friends and Family: Twice a week    Attends Religious Services: More than 4 times per year    Active Member of [employer] or Organizations: Yes    Attends Banker Meetings: 1 to 4 times per year    Marital Status: Married  Catering manager Violence: Not At Risk (03/22/2023)   Humiliation,  Afraid, Rape, and Kick questionnaire    Fear of Current or Ex-Partner: No    Emotionally Abused: No    Physically Abused: No    Sexually Abused: No   Current Outpatient Medications on File Prior to Visit  Medication Sig Dispense Refill  albuterol (VENTOLIN HFA) 108 (90 Base) MCG/ACT inhaler TAKE 2 PUFFS BY MOUTH EVERY 6 HOURS AS NEEDED FOR WHEEZE OR SHORTNESS OF BREATH 6.7 each 3   atorvastatin (LIPITOR) 40 MG tablet TAKE 1 TABLET BY MOUTH EVERY DAY 90 tablet 3   celecoxib (CELEBREX) 200 MG capsule Take 1 capsule (200 mg total) by mouth 2 (two) times daily between meals as needed. 60 capsule 1   Cholecalciferol (VITAMIN D3) 1.25 MG (50000 UT) CAPS Take 50,000 Units by mouth every 30 (thirty) days.     dexlansoprazole (DEXILANT) 60 MG capsule TAKE 1 CAPSULE BY MOUTH EVERY DAY 90 capsule 3   Dulaglutide (TRULICITY) 0.75 MG/0.5ML SOAJ Inject into the skin weekly 2 mL 0   furosemide (LASIX) 20 MG tablet TAKE 1 TABLET (20 MG TOTAL) BY MOUTH DAILY AS NEEDED FOR FLUID OR EDEMA. (Patient taking differently: Take 20 mg by mouth daily.) 90 tablet 3   gabapentin (NEURONTIN) 600 MG tablet Take one tablet in the morning and two tablets before bedtime 270 tablet 2   glucose blood (ONETOUCH ULTRA) test strip USE 2 TIMES DAILY. AND LANCETS 2/DAY 200 strip 3   insulin glargine (LANTUS SOLOSTAR) 100 UNIT/ML Solostar Pen INJECT 30 UNITS INTO THE SKIN AT BEDTIME. 9 mL 1   insulin lispro (HUMALOG KWIKPEN) 100 UNIT/ML KwikPen INJECT 5-9 UNITS INTO THE SKIN 2 (TWO) TIMES DAILY BEFORE A MEAL. 30 mL 1   Insulin Pen Needle 32G X 4 MM MISC Use 3-4x a day 300 each 3   Insulin Syringe-Needle U-100 (BD INSULIN SYRINGE U/F) 31G X 5/16" 1 ML MISC USE UP TO 3 TIMES A DAY 100 each 5   ketoconazole (NIZORAL) 2 % cream APPLY TWICE A DAY FOR 2 WEEKS AS NEEDED FOR RASH 30 g 2   olmesartan (BENICAR) 40 MG tablet TAKE 1 TABLET BY MOUTH EVERY DAY 90 tablet 3   ondansetron (ZOFRAN ODT) 4 MG disintegrating tablet Take 1 tablet (4 mg  total) by mouth every 8 (eight) hours as needed for nausea or vomiting. 30 tablet 0   polyethylene glycol powder ([employer] PURELAX) 17 GM/SCOOP powder Take 17 g by mouth at bedtime.     sucralfate (CARAFATE) 1 GM/10ML suspension Take 10 mLs (1 g total) by mouth daily as needed (nausea).     valACYclovir (VALTREX) 1000 MG tablet Take 1 tablet (1,000 mg total) by mouth 3 (three) times daily. Hold your daily 500mg  dose of Valacyclovir while taking this prescription. Take 2 doses today, then start tid dosing tomorrow for next 6 days until pills are done, then resume your daily 500mg  dose. 21 tablet 0   valACYclovir (VALTREX) 500 MG tablet Take 1 tablet (500 mg total) by mouth daily. 90 tablet 3   No current facility-administered medications on file prior to visit.   Allergies  Allergen Reactions   Codeine Nausea And Vomiting   Gluten Meal Other (See Comments)    No wheat or soy   Metronidazole Nausea And Vomiting   Nsaids Nausea And Vomiting and Other (See Comments)    Cannot tolerate large doses d/t Celiac disease    Soy Allergy (Do Not Select)    Wheat    Family History  Problem Relation Age of Onset   Hypertension Mother    Colon cancer Sister        dx in her early 93s   Diabetes Sister    Endometrial cancer Sister    Hypertension Brother    Other Father  2 collapsed lungs   COPD Father    Diabetes Father    Heart attack Father    Heart failure Father    High blood pressure Father    Colon polyps Neg Hx    Esophageal cancer Neg Hx    Stomach cancer Neg Hx    PE: There were no vitals taken for this visit. Wt Readings from Last 20 Encounters:  03/27/23 214 lb (97.1 kg)  03/22/23 207 lb (93.9 kg)  03/13/23 207 lb (93.9 kg)  02/07/23 206 lb (93.4 kg)  01/03/23 205 lb (93 kg)  09/12/22 210 lb (95.3 kg)  09/05/22 209 lb (94.8 kg)  08/31/22 213 lb (96.6 kg)  08/17/22 215 lb (97.5 kg)  08/10/22 211 lb (95.7 kg)  08/07/22 216 lb (98 kg)  07/06/22 208 lb (94.3 kg)   06/20/22 211 lb 4 oz (95.8 kg)  06/14/22 211 lb 9.6 oz (96 kg)  06/08/22 204 lb (92.5 kg)  06/01/22 213 lb (96.6 kg)  05/11/22 209 lb (94.8 kg)  05/04/22 213 lb (96.6 kg)  04/18/22 214 lb (97.1 kg)  04/13/22 207 lb (93.9 kg)   Constitutional: overweight, in NAD, walks with a cane Eyes: no exophthalmos ENT: no thyromegaly, no cervical lymphadenopathy Cardiovascular: RRR, No MRG Respiratory: CTA B Musculoskeletal: no deformities Skin:  no rashes Neurological: no tremor with outstretched hands  ASSESSMENT: 1. DM2, insulin-dependent, uncontrolled, with complications - gastroparesis - PN - Autonomic neuropathy - DR  2. HL  3.  Multinodular goiter  PLAN:  1. Patient with longstanding, uncontrolled, type 2 diabetes, on injectable diabetic regimen with basal large bolus insulin and weekly GLP-1 receptor agonist, low-dose.  She returned after 10 months from the previous visit.  At that time, HbA1c was higher, at 7.2%.  Sugars are higher than target in the morning and they were improving around dinnertime.  At that time, I suggested to switch from premixed insulin to a basal/bolus regimen.  We continued Trulicity.  She had another HbA1c in 08/2021 which was low, at 6.3%. -At today's visit, blood sugars appear to be at goal, but with occasional low blood sugars in the 60s and even 50s overnight.  I advised her to decrease the Lantus dose and let me know if she has any more lows, as in that case we may need to decrease it even more. - I suggested to:  Patient Instructions  Please decrease: - Lantus 25 units  at night  Continue: - Humalog 5-7 units 2x a day 15 min before meals - Trulicity 0.75 mg weekly   Please schedule another appt. with podiatry.  Please schedule a new thyroid U/S.  Please return in 4 months with your sugar log.   - we checked her HbA1c: 6.1% (lower) - advised to check sugars at different times of the day - 2x a day, rotating check times - advised for yearly  eye exams >> she is UTD - she has not seen podiatry in a while although they are refilling her Neurontin.  I advised her to schedule another appointment. - return to clinic in 3-4 months  2. HL -Lipid panel from 03/2022 showed an LDL slightly above target, otherwise fractions at goal: Lab Results  Component Value Date   CHOL 145 04/12/2022   HDL 54.40 04/12/2022   LDLCALC 72 04/12/2022   LDLDIRECT 143.2 11/23/2006   TRIG 93.0 04/12/2022   CHOLHDL 3 04/12/2022  -She is on Lipitor 40 mg daily without side effects. -She is due for  another lipid panel -has appointment with PCP coming up.  3. MNG -No neck compression symptoms and no masses felt on palpation of her neck today -Latest TSH was normal: Lab Results  Component Value Date   TSH 1.04 04/12/2022  -She had a thyroid ultrasound in 2021 which showed that the nodules remained unchanged.  The right lower pole nodule qualified for follow-up based on size.  Reportedly she had a benign biopsy-performed by Dr. Everardo All.  I was not able to find the records on this.  We repeated another ultrasound 02/17/2022 and the 1.2 cm isthmic nodule was stable and still met criteria for follow-up.  The right lower nodule was smaller. -At today's visit, we will order a new thyroid ultrasound for follow-up  Thyroid U/S (04/17/2023): Parenchymal Echotexture: Mildly heterogenous  Isthmus: 0.4 cm  Right lobe: 5.2 x 1.8 x 1.9 cm  Left lobe: 4.5 x 1.4 x 1.4 cm  _________________________________________________________   Estimated total number of nodules >/= 1 cm: 2  ________________________________________________________   Nodule # 1:  Prior biopsy: No  Location: Isthmus  Maximum size: 1.3 cm; Other 2 dimensions: 1.3 x 1.2 cm, previously, 1.2 cm  Composition: solid/almost completely solid (2)  Echogenicity: isoechoic (1) Given size (<1.4 cm) and appearance, this nodule does NOT meet TI-RADS criteria for biopsy or dedicated follow-up.   _________________________________________________________   1.1 cm vague nodularity remaining in the inferior right lobe appears stable since the prior study and does not meet criteria for follow-up. No enlarged or abnormal appearing lymph nodes are identified.   IMPRESSION: Stable 1.3 cm isoechoic nodule in the isthmus and 1.1 cm vague nodularity in the inferior right lobe. Neither nodule meets criteria for dedicated follow-up.   Carlus Pavlov, MD PhD [facility] Endocrinology

## 2023-04-11 ENCOUNTER — Ambulatory Visit: Payer: Medicare Other | Admitting: Bariatrics

## 2023-04-17 ENCOUNTER — Ambulatory Visit
Admission: RE | Admit: 2023-04-17 | Discharge: 2023-04-17 | Disposition: A | Discharge status: Home / Self Care | Payer: Medicare Other | Source: Ambulatory Visit | Attending: Internal Medicine | Admitting: Internal Medicine

## 2023-04-17 ENCOUNTER — Encounter: Payer: Self-pay | Admitting: Family Medicine

## 2023-04-17 ENCOUNTER — Ambulatory Visit: Payer: Medicare Other | Admitting: Family Medicine

## 2023-04-17 VITALS — BP 158/70 | HR 68 | Temp 98.0°F | Ht 65.0 in | Wt 231.4 lb

## 2023-04-17 DIAGNOSIS — E042 Nontoxic multinodular goiter: Secondary | ICD-10-CM

## 2023-04-17 DIAGNOSIS — Z0001 Encounter for general adult medical examination with abnormal findings: Secondary | ICD-10-CM

## 2023-04-17 DIAGNOSIS — E119 Type 2 diabetes mellitus without complications: Secondary | ICD-10-CM

## 2023-04-17 DIAGNOSIS — I10 Essential (primary) hypertension: Secondary | ICD-10-CM | POA: Diagnosis not present

## 2023-04-17 DIAGNOSIS — Z794 Long term (current) use of insulin: Secondary | ICD-10-CM | POA: Diagnosis not present

## 2023-04-17 DIAGNOSIS — Z0000 Encounter for general adult medical examination without abnormal findings: Secondary | ICD-10-CM | POA: Diagnosis not present

## 2023-04-17 DIAGNOSIS — E782 Mixed hyperlipidemia: Secondary | ICD-10-CM | POA: Diagnosis not present

## 2023-04-17 DIAGNOSIS — E1142 Type 2 diabetes mellitus with diabetic polyneuropathy: Secondary | ICD-10-CM | POA: Diagnosis not present

## 2023-04-17 DIAGNOSIS — E1169 Type 2 diabetes mellitus with other specified complication: Secondary | ICD-10-CM

## 2023-04-17 DIAGNOSIS — Z23 Encounter for immunization: Secondary | ICD-10-CM

## 2023-04-17 DIAGNOSIS — Z6838 Body mass index (BMI) 38.0-38.9, adult: Secondary | ICD-10-CM

## 2023-04-17 DIAGNOSIS — K900 Celiac disease: Secondary | ICD-10-CM

## 2023-04-17 DIAGNOSIS — E118 Type 2 diabetes mellitus with unspecified complications: Secondary | ICD-10-CM | POA: Diagnosis not present

## 2023-04-17 LAB — COMPREHENSIVE METABOLIC PANEL
ALT: 12 U/L (ref 0–35)
AST: 16 U/L (ref 0–37)
Albumin: 4.2 g/dL (ref 3.5–5.2)
Alkaline Phosphatase: 101 U/L (ref 39–117)
BUN: 20 mg/dL (ref 6–23)
CO2: 28 meq/L (ref 19–32)
Calcium: 9.3 mg/dL (ref 8.4–10.5)
Chloride: 104 meq/L (ref 96–112)
Creatinine, Ser: 0.95 mg/dL (ref 0.40–1.20)
GFR: 59.84 mL/min — ABNORMAL LOW (ref >60.00)
Glucose, Bld: 162 mg/dL — ABNORMAL HIGH (ref 70–99)
Potassium: 4.2 meq/L (ref 3.5–5.1)
Sodium: 139 meq/L (ref 135–145)
Total Bilirubin: 0.6 mg/dL (ref 0.2–1.2)
Total Protein: 7 g/dL (ref 6.0–8.3)

## 2023-04-17 LAB — CBC WITH DIFFERENTIAL/PLATELET
Basophils Absolute: 0 10*3/uL (ref 0.0–0.1)
Basophils Relative: 0.9 % (ref 0.0–3.0)
Eosinophils Absolute: 0.1 10*3/uL (ref 0.0–0.7)
Eosinophils Relative: 2.4 % (ref 0.0–5.0)
HCT: 42.3 % (ref 36.0–46.0)
Hemoglobin: 14.1 g/dL (ref 12.0–15.0)
Lymphocytes Relative: 27.1 % (ref 12.0–46.0)
Lymphs Abs: 0.8 10*3/uL (ref 0.7–4.0)
MCHC: 33.3 g/dL (ref 30.0–36.0)
MCV: 100.5 fL — ABNORMAL HIGH (ref 78.0–100.0)
Monocytes Absolute: 0.1 10*3/uL (ref 0.1–1.0)
Monocytes Relative: 4.6 % (ref 3.0–12.0)
Neutro Abs: 1.9 10*3/uL (ref 1.4–7.7)
Neutrophils Relative %: 65 % (ref 43.0–77.0)
Platelets: 219 10*3/uL (ref 150.0–400.0)
RBC: 4.21 Mil/uL (ref 3.87–5.11)
RDW: 13.1 % (ref 11.5–15.5)
WBC: 3 10*3/uL — ABNORMAL LOW (ref 4.0–10.5)

## 2023-04-17 LAB — LIPID PANEL
Cholesterol: 158 mg/dL (ref 0–200)
HDL: 61.1 mg/dL (ref >39.00)
LDL Cholesterol: 86 mg/dL (ref 0–99)
NonHDL: 96.87
Total CHOL/HDL Ratio: 3
Triglycerides: 55 mg/dL (ref 0.0–149.0)
VLDL: 11 mg/dL (ref 0.0–40.0)

## 2023-04-17 LAB — MICROALBUMIN / CREATININE URINE RATIO
Creatinine,U: 162.4 mg/dL
Microalb Creat Ratio: 0.5 mg/g (ref 0.0–30.0)
Microalb, Ur: 0.9 mg/dL (ref 0.0–1.9)

## 2023-04-17 LAB — TSH: TSH: 1.56 u[IU]/mL (ref 0.35–5.50)

## 2023-04-17 MED ORDER — AMLODIPINE BESYLATE 2.5 MG PO TABS: 2.5000 mg | ORAL_TABLET | Freq: Every day | ORAL | 3 refills | Status: DC

## 2023-04-17 NOTE — Patient Instructions (Signed)
Please return in 3 months to recheck blood pressure   I will release your lab results to you on your MyChart account with further instructions. You may see the results before I do, but when I review them I will send you a message with my report or have my assistant call you if things need to be discussed. Please reply to my message with any questions. Thank you!   I added amlodipine 2.5mg  to be taken daily to help lower your blood pressure.   If you have any questions or concerns, please don't hesitate to send me a message via MyChart or call the office at [phone]. Thank you for visiting with [place] today! It's our pleasure caring for you.

## 2023-04-17 NOTE — Progress Notes (Signed)
Subjective  Chief Complaint  Patient presents with   Annual Exam    Pt here for Annual Exam and is currently fasting    Diabetes   Hypertension    HPI: Jessica [name] is a 72 y.o. female who presents to [name] [place] today for a Female Wellness Visit. She also has the concerns and/or needs as listed above in the chief complaint. These will be addressed in addition to the Health Maintenance Visit.   Wellness Visit: annual visit with health maintenance review and exam  Health maintenance: Eligible for flu shot today.  Reports had eye exam in January.  We will call for records.  Will be due again in January.  Other immunizations are up to date.  Mammogram current. Chronic disease f/u and/or acute problem visit: (deemed necessary to be done in addition to the wellness visit): Type 2 diabetes: Reviewed recent endocrinology notes.  Adjusting insulin down to prevent lows.  A1c at goal.  On ARB.  On statin.  No retinopathy but does have chronic peripheral neuropathy.  Denies foot sores or lesions.  No claudication. Hypertension: Mildly elevated at endocrinology office and elevated in the office again today.  She is on olmesartan 40 mg daily.  No chest pain or shortness of breath.  No lower extremity edema on Lasix. Seeing healthy weight wellness. Hyperlipidemia on atorvastatin 40 mg due for fasting recheck today.  Tolerates well. Follows with GI for celiac disease. Will have good follow-up on multinodular goiter.  She is asymptomatic.  Assessment  1. Encounter for well adult exam with abnormal findings   2. Need for influenza vaccination   3. Type 2 diabetes mellitus with complication, with long-term current use of insulin (HCC)   4. Peripheral sensory neuropathy due to type 2 diabetes mellitus (HCC)   5. Morbid obesity (HCC)   6. Essential hypertension   7. Diabetic polyneuropathy associated with type 2 diabetes mellitus (HCC)   8. Combined hyperlipidemia  associated with type 2 diabetes mellitus (HCC)   9. Celiac disease   10. Insulin-requiring or dependent type II diabetes mellitus (HCC)   11. Multinodular goiter (nontoxic)      Plan  Female Wellness Visit: Age appropriate Health Maintenance and Prevention measures were discussed with patient. Included topics are cancer screening recommendations, ways to keep healthy (see AVS) including dietary and exercise recommendations, regular eye and dental care, use of seat belts, and avoidance of moderate alcohol use and tobacco use.  BMI: discussed patient's BMI and encouraged positive lifestyle modifications to help get to or maintain a target BMI. HM needs and immunizations were addressed and ordered. See below for orders. See HM and immunization section for updates.  Flu shot today Routine labs and screening tests ordered including cmp, cbc and lipids where appropriate. Discussed recommendations regarding Vit D and calcium supplementation (see AVS)  Chronic disease management visit and/or acute problem visit: Diabetes per endocrine.  Patient to schedule eye exam.  Renal nephropathy screen today on ARB. Hypertension: Mildly elevated above goal.  Add amlodipine 2.5 mg daily.  Continue olmesartan 40 mg daily.  Recheck in 3 months, consider combination dosing if able. Hyperlipidemia on Lipitor 40 mg.  Check fasting today. Follow-up on thyroid nodule ultrasound.  Check TSH Monitor footcare.  Follow up: 3 months for recheck blood pressure Orders Placed This Encounter  Procedures   Flu Vaccine Trivalent High Dose (Fluad)   CBC with Differential/Platelet   Comprehensive metabolic panel   Lipid panel  TSH   Microalbumin / creatinine urine ratio   Meds ordered this encounter  Medications   amLODipine (NORVASC) 2.5 MG tablet    Sig: Take 1 tablet (2.5 mg total) by mouth daily.    Dispense:  90 tablet    Refill:  3      Body mass index is 38.51 kg/m. Wt Readings from Last 3 Encounters:   04/17/23 231 lb 6.4 oz (105 kg)  04/10/23 216 lb 12.8 oz (98.3 kg)  03/27/23 214 lb (97.1 kg)     Patient Active Problem List   Diagnosis Date Noted Date Diagnosed   Morbid obesity (HCC) 04/13/2022     Priority: High    BMI 34    Peripheral sensory neuropathy due to type 2 diabetes mellitus (HCC) 04/05/2020     Priority: High   Celiac disease 02/14/2018     Priority: High   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) 02/14/2018     Priority: High   Class 2 obesity due to excess calories with body mass index (BMI) of 37.0 to 37.9 in adult 02/14/2018     Priority: High   Type 2 diabetes mellitus with complication, with long-term current use of insulin (HCC) 09/23/2010     Priority: High   Combined hyperlipidemia associated with type 2 diabetes mellitus (HCC) 01/29/2009     Priority: High   Essential hypertension 10/22/2006     Priority: High    Qualifier: Diagnosis of  By: Tyrone Apple, Lucy      Status post total right knee replacement 09/12/2022     Priority: Medium    Degenerative arthritis of left knee 08/07/2022     Priority: Medium     Toradol injection given in August 07, 2022    Gastroesophageal reflux disease 04/18/2022     Priority: Medium    Family history of malignant neoplasm of endometrium 10/08/2018     Priority: Medium    Osteopenia 02/14/2018     Priority: Medium     dexa 2019    Mild intermittent asthma 02/14/2018     Priority: Medium    Degenerative cervical disc 07/27/2015     Priority: Medium    OSA (obstructive sleep apnea) 02/28/2011     Priority: Medium    Constipation, slow transit 09/23/2010     Priority: Medium    FH: colon cancer 09/23/2010     Priority: Medium    History of colonic polyps 09/23/2010     Priority: Medium     IMO SNOMED Dx Update Oct 2024    Diabetic gastroparesis (HCC) 02/27/2007     Priority: Medium    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) 10/22/2006      Priority: Medium    Chronic rhinitis 04/18/2022     Priority: Low   Vitamin D deficiency 04/13/2022     Priority: Low   Bilateral lower extremity edema 01/20/2020     Priority: Low   Fibroma of tongue 06/06/2016     Priority: Low   Renal calculi 01/13/2016     Priority: Low   Multinodular goiter (nontoxic) 04/17/2023     Endocrine following    Health Maintenance  Topic Date Due   OPHTHALMOLOGY EXAM  04/07/2022   FOOT EXAM  10/20/2022   COVID-19 Vaccine (4 - 2024-25 season) 05/03/2023 (Originally 12/31/2022)   INFLUENZA VACCINE  07/30/2023 (Originally 11/30/2022)   Diabetic kidney evaluation - Urine ACR  10/09/2027 (Originally 10/20/2022)  MAMMOGRAM  06/30/2023   Diabetic kidney evaluation - eGFR measurement  09/13/2023   HEMOGLOBIN A1C  10/09/2023   Medicare Annual Wellness (AWV)  03/21/2024   Colonoscopy  10/13/2024   DEXA SCAN  10/25/2025   DTaP/Tdap/Td (3 - Td or Tdap) 10/26/2027   Pneumonia Vaccine 10+ Years old  Completed   Hepatitis C Screening  Completed   Zoster Vaccines- Shingrix  Completed   HPV VACCINES  Aged Out   Immunization History  Administered Date(s) Administered   Fluad Quad(high Dose 65+) 01/16/2019, 01/20/2020, 04/12/2022   Influenza,inj,Quad PF,6+ Mos 02/14/2018   Influenza-Unspecified 12/30/2020   PFIZER Comirnaty(Gray Top)Covid-19 Tri-Sucrose Vaccine 09/02/2020   PFIZER(Purple Top)SARS-COV-2 Vaccination 07/03/2019, 08/06/2019   Pneumococcal Conjugate-13 11/03/2015   Pneumococcal Polysaccharide-23 08/17/2010, 02/14/2018   Td 12/31/2002   Tdap 10/25/2017   Zoster Recombinant(Shingrix) 11/05/2018, 01/28/2019   We updated and reviewed the patient's past history in detail and it is documented below. Allergies: Patient is allergic to codeine, gluten meal, metronidazole, nsaids, soy allergy (do not select), and wheat. Past Medical History Patient  has a past medical history of Anxiety, Arthritis, Asthma, Autonomic neuropathy, Cataract, Celiac disease,  Constipation, Depression, Diabetes mellitus, Diabetic neuropathy (HCC), Diverticulosis, Fatty liver, Gastroparesis, GERD (gastroesophageal reflux disease), History of claustrophobia, Hyperlipidemia, Hypertension, IBS (irritable bowel syndrome), Kidney stones, Personal history of colonic polyps (03/2010), PONV (postoperative nausea and vomiting), and Shingles (2021). Past Surgical History Patient  has a past surgical history that includes Tubal ligation; Appendectomy; left breast cyst removal (2000); Dilatation & currettage/hysteroscopy with resectoscope (N/A, 03/24/2013); Colonoscopy; Hysteroscopy with D & C (N/A, 11/08/2015); Cataract extraction (Right, 04/29/2018); Cataract extraction (Left, 03/2018); LEFT HEART CATH AND CORONARY ANGIOGRAPHY (N/A, 10/31/2018); and Total knee arthroplasty (Right, 09/12/2022). Family History: Patient family history includes COPD in her father; Colon cancer in her sister; Diabetes in her father and sister; Endometrial cancer in her sister; Heart attack in her father; Heart failure in her father; High blood pressure in her father; Hypertension in her brother and mother; Other in her father. Social History:  Patient  reports that she has never smoked. She has never used smokeless tobacco. She reports that she does not drink alcohol and does not use drugs.  Review of Systems: Constitutional: negative for fever or malaise Ophthalmic: negative for photophobia, double vision or loss of vision Cardiovascular: negative for chest pain, dyspnea on exertion, or new LE swelling Respiratory: negative for SOB or persistent cough Gastrointestinal: negative for abdominal pain, change in bowel habits or melena Genitourinary: negative for dysuria or gross hematuria, no abnormal uterine bleeding or disharge Musculoskeletal: negative for new gait disturbance or muscular weakness Integumentary: negative for new or persistent rashes, no breast lumps Neurological: negative for TIA or stroke  symptoms Psychiatric: negative for SI or delusions Allergic/Immunologic: negative for hives  Patient Care Team    Relationship Specialty Notifications Start End  Willow Ora, MD PCP - General Family Medicine  02/14/18   Corky Crafts, MD PCP - Cardiology Cardiology Admissions 11/12/18   Levi Aland, MD Attending Physician Obstetrics and Gynecology  04/04/12   Romero Belling, MD (Inactive) Consulting Physician Endocrinology  02/14/18   Pyrtle, Carie Caddy, MD Consulting Physician Gastroenterology  01/16/19   Rodrigo Ran, OD Consulting Physician Ophthalmology  01/16/19   Elinor Parkinson, [facility] Consulting Physician Podiatry  01/16/19   Erroll Luna, [facility] (Inactive) Pharmacist Pharmacist  03/04/21    Comment: [phone]    Objective  Vitals: BP (!) 158/70   Pulse 68  Temp 98 F (36.7 C)   Ht 5\' 5"  (1.651 m)   Wt 231 lb 6.4 oz (105 kg)   SpO2 98%   BMI 38.51 kg/m  General:  Well developed, well nourished, no acute distress, Psych:  Alert and orientedx3,normal mood and affect, appears happy HEENT:  Normocephalic, atraumatic, non-icteric sclera,  supple neck without adenopathy, mass or thyromegaly Cardiovascular:  Normal S1, S2, RRR without gallop, rub or murmur Respiratory:  Good breath sounds bilaterally, CTAB with normal respiratory effort Gastrointestinal: normal bowel sounds, soft, non-tender, no noted masses. No HSM MSK: extremities without edema, joints without erythema or swelling Neurologic:    Mental status is normal.  Gross motor and sensory exams are normal.  No tremor Diabetic Foot Exam: Appearance - no lesions, ulcers or significant calluses Skin - no sigificant pallor or erythema Normal sensation Pulses - +2 distally bilaterally  Commons side effects, risks, benefits, and alternatives for medications and treatment plan prescribed today were discussed, and the patient expressed understanding of the given instructions. Patient is instructed to call or message  via MyChart if he/she has any questions or concerns regarding our treatment plan. No barriers to understanding were identified. We discussed Red Flag symptoms and signs in detail. Patient expressed understanding regarding what to do in case of urgent or emergency type symptoms.  Medication list was reconciled, printed and provided to the patient in AVS. Patient instructions and summary information was reviewed with the patient as documented in the AVS. This note was prepared with assistance of Dragon voice recognition software. Occasional wrong-word or sound-a-like substitutions may have occurred due to the inherent limitations of voice recognition software

## 2023-04-18 ENCOUNTER — Encounter: Payer: Self-pay | Admitting: Bariatrics

## 2023-04-18 ENCOUNTER — Ambulatory Visit (INDEPENDENT_AMBULATORY_CARE_PROVIDER_SITE_OTHER): Payer: Medicare Other | Admitting: Bariatrics

## 2023-04-18 VITALS — BP 159/76 | HR 71 | Temp 97.8°F | Ht 65.0 in | Wt 208.0 lb

## 2023-04-18 DIAGNOSIS — E669 Obesity, unspecified: Secondary | ICD-10-CM | POA: Diagnosis not present

## 2023-04-18 DIAGNOSIS — I10 Essential (primary) hypertension: Secondary | ICD-10-CM | POA: Diagnosis not present

## 2023-04-18 DIAGNOSIS — Z794 Long term (current) use of insulin: Secondary | ICD-10-CM

## 2023-04-18 DIAGNOSIS — E1142 Type 2 diabetes mellitus with diabetic polyneuropathy: Secondary | ICD-10-CM | POA: Diagnosis not present

## 2023-04-18 DIAGNOSIS — Z6834 Body mass index (BMI) 34.0-34.9, adult: Secondary | ICD-10-CM | POA: Diagnosis not present

## 2023-04-18 DIAGNOSIS — E6609 Other obesity due to excess calories: Secondary | ICD-10-CM

## 2023-04-18 DIAGNOSIS — Z7985 Long-term (current) use of injectable non-insulin antidiabetic drugs: Secondary | ICD-10-CM

## 2023-04-18 MED ORDER — TRULICITY 0.75 MG/0.5ML ~~LOC~~ SOAJ: SUBCUTANEOUS | 0 refills | Status: DC

## 2023-04-18 NOTE — Progress Notes (Signed)
WEIGHT SUMMARY AND BIOMETRICS  Weight Lost Since Last Visit: 0  Weight Gained Since Last Visit: 1lb   Vitals Temp: 97.8 F (36.6 C) BP: (!) 159/76 Pulse Rate: 71 SpO2: 100 %   Anthropometric Measurements Height: 5\' 5"  (1.651 m) Weight: 208 lb (94.3 kg) BMI (Calculated): 34.61 Weight at Last Visit: 207lb Weight Lost Since Last Visit: 0 Weight Gained Since Last Visit: 1lb Starting Weight: 246 Total Weight Loss (lbs): 38 lb (17.2 kg)   Body Composition  Body Fat %: 46.5 % Fat Mass (lbs): 97 lbs Muscle Mass (lbs): 106 lbs Total Body Water (lbs): 80 lbs Visceral Fat Rating : 14   Other Clinical Data Fasting: no Labs: no Today's Visit #: 41 Starting Date: 03/12/19    OBESITY Tess is here to discuss her progress with her obesity treatment plan along with follow-up of her obesity related diagnoses.    Nutrition Plan: the Category 3 plan - 90% adherence.  Current exercise:  cubie  Interim History:  She is up 1 lb.  Eating all of the food on the plan., Is not skipping meals, Water intake is adequate., and Denies polyphagia   Pharmacotherapy: Jenai is on Trulicity 0.75 mg SQ weekly Adverse side effects: None Hunger is moderately controlled.  Cravings are moderately controlled.  Assessment/Plan:   1. Type 2 diabetes mellitus with diabetic polyneuropathy, with long-term current use of insulin (HCC)  She states that she recently saw her endocrinologist and they lowered her Lantus from 30 units to 25 units.  This change in Lantus dose was due to low blood sugars.  He states that her endocrinologist wanted her to stay on the Trulicity at the current dose and maybe increase it in the future.  HgbA1c is at goal. Last A1c was 6.1 CBGs: Fasting 110 to 120       Episodes of hypoglycemia: yes - occasional down to 51 in the am.  Medication(s): Trulicity  0.75 mg SQ weekly and Lantus  Lab Results  Component Value Date   HGBA1C 6.1 (A) 04/10/2023   HGBA1C 6.3 (H) 08/31/2022   HGBA1C 7.2 (A) 06/14/2022   Lab Results  Component Value Date   MICROALBUR 0.9 04/17/2023   LDLCALC 86 04/17/2023   CREATININE 0.95 04/17/2023   Lab Results  Component Value Date   GFR 59.84 (L) 04/17/2023   GFR 57.36 (L) 04/12/2022   GFR 60.62 06/09/2021    Plan: Continue and refill Trulicity 0.75 mg SQ weekly Continue all other medications.  Will keep all carbohydrates low both sweets and starches.  Will continue exercise regimen to 30 to 60 minutes on most days of the week.  Aim for 7 to 9 hours of sleep nightly.  Eat more low glycemic index foods.   Hypertension Hypertension control uncertain.  Medication(s): Benicar 40 mg once daily , Amlodipine 2.5 mg.   BP Readings from  Last 3 Encounters:  04/18/23 (!) 159/76  04/17/23 (!) 158/70  04/10/23 (!) 140/70   Lab Results  Component Value Date   CREATININE 0.95 04/17/2023   CREATININE 0.93 09/13/2022   CREATININE 1.08 (H) 08/31/2022   Lab Results  Component Value Date   GFR 59.84 (L) 04/17/2023   GFR 57.36 (L) 04/12/2022   GFR 60.62 06/09/2021    Plan: Continue all antihypertensives at current dosages. No added salt. Will keep sodium content to 1,500 mg or less per day.    Generalized Obesity: Current BMI BMI (Calculated): 34.61   Pharmacotherapy Plan Continue and refill  Trulicity 0.75 mg SQ weekly  Laurene is currently in the action stage of change. As such, her goal is to continue with weight loss efforts.  She has agreed to the Category 3 plan.  Exercise goals: Older adults should follow the adult guidelines. When older adults cannot meet the adult guidelines, they should be as physically active as their abilities and conditions will allow.   Behavioral modification strategies: increasing lean protein intake, meal planning , increase water intake, better snacking choices, planning  for success, decrease snacking , and keep healthy foods in the home.  Ricka has agreed to follow-up with our clinic in 4 weeks.      Objective:   VITALS: Per patient if applicable, see vitals. GENERAL: Alert and in no acute distress. CARDIOPULMONARY: No increased WOB. Speaking in clear sentences.  PSYCH: Pleasant and cooperative. Speech normal rate and rhythm. Affect is appropriate. Insight and judgement are appropriate. Attention is focused, linear, and appropriate.  NEURO: Oriented as arrived to appointment on time with no prompting.   Attestation Statements:   This was prepared with the assistance of Engineer, civil (consulting).  Occasional wrong-word or sound-a-like substitutions may have occurred due to the inherent limitations of voice recognition   Corinna Capra, DO

## 2023-04-19 NOTE — Progress Notes (Signed)
See mychart note Dear Ms. Mccort, Your lab results are stable.  Your cholesterol levels are a little higher than we want this year. Please ensure you are taking your lipitor 40mg  nightly and eating a low fat diet. We can consider increasing the dose of your lipitor to 80mg  nightly to push your LDL <70 if needed. We can discuss at your next appointment. I also recommend taking otc vitamin B12 daily to help support your blood counts.  It was good seeing you. We will follow up on your blood pressure and cholesterol in 3 months.   Sincerely, Dr. Mardelle Matte

## 2023-04-29 ENCOUNTER — Other Ambulatory Visit: Payer: Self-pay | Admitting: Orthopaedic Surgery

## 2023-05-17 ENCOUNTER — Ambulatory Visit: Payer: Medicare Other | Admitting: Bariatrics

## 2023-05-17 ENCOUNTER — Encounter: Payer: Self-pay | Admitting: Bariatrics

## 2023-05-17 VITALS — BP 143/77 | HR 68 | Temp 97.7°F | Ht 65.0 in | Wt 210.0 lb

## 2023-05-17 DIAGNOSIS — Z6834 Body mass index (BMI) 34.0-34.9, adult: Secondary | ICD-10-CM | POA: Diagnosis not present

## 2023-05-17 DIAGNOSIS — Z794 Long term (current) use of insulin: Secondary | ICD-10-CM | POA: Diagnosis not present

## 2023-05-17 DIAGNOSIS — Z7985 Long-term (current) use of injectable non-insulin antidiabetic drugs: Secondary | ICD-10-CM

## 2023-05-17 DIAGNOSIS — E1142 Type 2 diabetes mellitus with diabetic polyneuropathy: Secondary | ICD-10-CM

## 2023-05-17 DIAGNOSIS — I10 Essential (primary) hypertension: Secondary | ICD-10-CM

## 2023-05-17 DIAGNOSIS — E119 Type 2 diabetes mellitus without complications: Secondary | ICD-10-CM | POA: Diagnosis not present

## 2023-05-17 DIAGNOSIS — E669 Obesity, unspecified: Secondary | ICD-10-CM

## 2023-05-17 DIAGNOSIS — E6609 Other obesity due to excess calories: Secondary | ICD-10-CM

## 2023-05-17 MED ORDER — VITAMIN D (ERGOCALCIFEROL) 1.25 MG (50000 UNIT) PO CAPS: 50000.0000 [IU] | ORAL_CAPSULE | ORAL | 0 refills | Status: DC

## 2023-05-17 MED ORDER — TRULICITY 0.75 MG/0.5ML ~~LOC~~ SOAJ: SUBCUTANEOUS | 0 refills | Status: DC

## 2023-05-17 MED ORDER — ONDANSETRON 4 MG PO TBDP: 4.0000 mg | ORAL_TABLET | Freq: Three times a day (TID) | ORAL | 0 refills | Status: DC | PRN

## 2023-05-17 NOTE — Progress Notes (Signed)
WEIGHT SUMMARY AND BIOMETRICS  Weight Lost Since Last Visit: 0  Weight Gained Since Last Visit: 2lb   Vitals Temp: 97.7 F (36.5 C) BP: (!) 143/77 Pulse Rate: 68 SpO2: 98 %   Anthropometric Measurements Height: 5\' 5"  (1.651 m) Weight: 210 lb (95.3 kg) BMI (Calculated): 34.95 Weight at Last Visit: 208lb Weight Lost Since Last Visit: 0 Weight Gained Since Last Visit: 2lb Starting Weight: 246lb Total Weight Loss (lbs): 36 lb (16.3 kg)   Body Composition  Body Fat %: 46.6 % Fat Mass (lbs): 98 lbs Muscle Mass (lbs): 106.6 lbs Total Body Water (lbs): 80.4 lbs Visceral Fat Rating : 15   Other Clinical Data Fasting: no Labs: no Today's Visit #: 71 Starting Date: 03/12/19    OBESITY Onalee is here to discuss her progress with her obesity treatment plan along with follow-up of her obesity related diagnoses.    Nutrition Plan: the Category 3 plan - 85% adherence.  Current exercise:  Cubie  and walking.  Interim History:  She is up 2 lbs since her last visit.  Eating all of the food on the plan., Protein intake is as prescribed, Is skipping meals, Water intake is adequate., and Denies polyphagia   Pharmacotherapy: Antonella is on Trulicity 0.75 mg SQ weekly Adverse side effects: None. She has nausea and abdominal pain if the dose is increased.  Hunger is moderately controlled.  Cravings are moderately controlled.  Assessment/Plan:  Type II Diabetes HgbA1c is at goal. Last A1c was 6.1 Episodes of hypoglycemia: no Medication(s): Trulicity 0.75 mg SQ weekly  Lab Results  Component Value Date   HGBA1C 6.1 (A) 04/10/2023   HGBA1C 6.3 (H) 08/31/2022   HGBA1C 7.2 (A) 06/14/2022   Lab Results  Component Value Date   MICROALBUR 0.9 04/17/2023   LDLCALC 86 04/17/2023   CREATININE 0.95 04/17/2023   Lab Results  Component Value Date   GFR 59.84 (L)  04/17/2023   GFR 57.36 (L) 04/12/2022   GFR 60.62 06/09/2021    Plan: Continue and refill Trulicity 0.75 mg SQ weekly Will consider increasing her Trulicity to the next dose up at her next visit as long as she is tolerating the medication.  Will keep all carbohydrates low both sweets and starches.  Will continue exercise regimen to 30 to 60 minutes on most days of the week.  Aim for 7 to 9 hours of sleep nightly.  Eat more low glycemic index foods.    Hypertension Hypertension borderline controlled.  Medication(s): Benicar 40 mg once daily , Amlodipine 2.5mg   BP Readings from Last 3 Encounters:  05/17/23 (!) 143/77  04/18/23 (!) 159/76  04/17/23 (!) 158/70   Lab Results  Component Value Date   CREATININE 0.95 04/17/2023   CREATININE 0.93 09/13/2022   CREATININE 1.08 (H) 08/31/2022   Lab Results  Component Value Date   GFR 59.84 (L)  04/17/2023   GFR 57.36 (L) 04/12/2022   GFR 60.62 06/09/2021    Plan: She will continue to check her blood pressures at home. Continue all antihypertensives at current dosages. No added salt. Will keep sodium content to 1,500 mg or less per day.  .    Generalized Obesity: Current BMI BMI (Calculated): 34.95   Pharmacotherapy Plan Continue and refill  Trulicity 0.75 mg SQ weekly.  We discussed going up on her Trulicity to the next dose at the next visit.  Shilpa is currently in the action stage of change. As such, her goal is to continue with weight loss efforts.  She has agreed to the pescatarian plan.  This is a change from the category 3 that she has been using for some time.  The calories on the pescatarian plan or less and also the carbohydrates.  Exercise goals: Older adults should follow the adult guidelines. When older adults cannot meet the adult guidelines, they should be as physically active as their abilities and conditions will allow.   Behavioral modification strategies: increasing lean protein intake, decreasing simple  carbohydrates , no meal skipping, increase water intake, better snacking choices, planning for success, increasing vegetables, get rid of junk food in the home, avoiding temptations, weigh protein portions, and mindful eating.  Zen has agreed to follow-up with our clinic in 4 weeks.      Objective:   VITALS: Per patient if applicable, see vitals. GENERAL: Alert and in no acute distress. CARDIOPULMONARY: No increased WOB. Speaking in clear sentences.  PSYCH: Pleasant and cooperative. Speech normal rate and rhythm. Affect is appropriate. Insight and judgement are appropriate. Attention is focused, linear, and appropriate.  NEURO: Oriented as arrived to appointment on time with no prompting.   Attestation Statements:   This was prepared with the assistance of Engineer, civil (consulting).  Occasional wrong-word or sound-a-like substitutions may have occurred due to the inherent limitations of voice recognition   Corinna Capra, DO

## 2023-06-05 ENCOUNTER — Encounter (HOSPITAL_COMMUNITY): Payer: Self-pay

## 2023-06-05 ENCOUNTER — Emergency Department (HOSPITAL_COMMUNITY): Payer: Medicare Other

## 2023-06-05 ENCOUNTER — Emergency Department (HOSPITAL_COMMUNITY)
Admission: EM | Admit: 2023-06-05 | Discharge: 2023-06-05 | Disposition: A | Discharge status: Home / Self Care | Payer: Medicare Other | Attending: Emergency Medicine | Admitting: Emergency Medicine

## 2023-06-05 ENCOUNTER — Other Ambulatory Visit: Payer: Self-pay

## 2023-06-05 DIAGNOSIS — R109 Unspecified abdominal pain: Secondary | ICD-10-CM | POA: Diagnosis present

## 2023-06-05 DIAGNOSIS — Z794 Long term (current) use of insulin: Secondary | ICD-10-CM | POA: Insufficient documentation

## 2023-06-05 DIAGNOSIS — N132 Hydronephrosis with renal and ureteral calculous obstruction: Secondary | ICD-10-CM | POA: Insufficient documentation

## 2023-06-05 DIAGNOSIS — Z79899 Other long term (current) drug therapy: Secondary | ICD-10-CM | POA: Insufficient documentation

## 2023-06-05 DIAGNOSIS — E119 Type 2 diabetes mellitus without complications: Secondary | ICD-10-CM | POA: Insufficient documentation

## 2023-06-05 DIAGNOSIS — N200 Calculus of kidney: Secondary | ICD-10-CM

## 2023-06-05 DIAGNOSIS — I10 Essential (primary) hypertension: Secondary | ICD-10-CM | POA: Insufficient documentation

## 2023-06-05 LAB — CBC
HCT: 41.2 % (ref 36.0–46.0)
Hemoglobin: 14.2 g/dL (ref 12.0–15.0)
MCH: 33.7 pg (ref 26.0–34.0)
MCHC: 34.5 g/dL (ref 30.0–36.0)
MCV: 97.9 fL (ref 80.0–100.0)
Platelets: 229 10*3/uL (ref 150–400)
RBC: 4.21 MIL/uL (ref 3.87–5.11)
RDW: 11.8 % (ref 11.5–15.5)
WBC: 7.5 10*3/uL (ref 4.0–10.5)
nRBC: 0 % (ref 0.0–0.2)

## 2023-06-05 LAB — COMPREHENSIVE METABOLIC PANEL
ALT: 14 U/L (ref 0–44)
AST: 18 U/L (ref 15–41)
Albumin: 4.1 g/dL (ref 3.5–5.0)
Alkaline Phosphatase: 104 U/L (ref 38–126)
Anion gap: 10 (ref 5–15)
BUN: 24 mg/dL — ABNORMAL HIGH (ref 8–23)
CO2: 23 mmol/L (ref 22–32)
Calcium: 9.4 mg/dL (ref 8.9–10.3)
Chloride: 104 mmol/L (ref 98–111)
Creatinine, Ser: 0.83 mg/dL (ref 0.44–1.00)
GFR, Estimated: >60 mL/min (ref >60)
Glucose, Bld: 195 mg/dL — ABNORMAL HIGH (ref 70–99)
Potassium: 3.8 mmol/L (ref 3.5–5.1)
Sodium: 137 mmol/L (ref 135–145)
Total Bilirubin: 0.8 mg/dL (ref 0.0–1.2)
Total Protein: 7.3 g/dL (ref 6.5–8.1)

## 2023-06-05 LAB — URINALYSIS, ROUTINE W REFLEX MICROSCOPIC
Bacteria, UA: NONE SEEN
Bilirubin Urine: NEGATIVE
Glucose, UA: >=500 mg/dL — AB
Ketones, ur: 5 mg/dL — AB
Leukocytes,Ua: NEGATIVE
Nitrite: NEGATIVE
Protein, ur: 30 mg/dL — AB
RBC / HPF: >50 RBC/hpf (ref 0–5)
Specific Gravity, Urine: 1.016 (ref 1.005–1.030)
pH: 5 (ref 5.0–8.0)

## 2023-06-05 MED ORDER — TAMSULOSIN HCL 0.4 MG PO CAPS: 0.4000 mg | ORAL_CAPSULE | Freq: Every day | ORAL | 0 refills | Status: DC

## 2023-06-05 MED ORDER — SODIUM CHLORIDE 0.9 % IV BOLUS
1000.0000 mL | Freq: Once | INTRAVENOUS | Status: AC
  Administered 2023-06-05: 1000 mL via INTRAVENOUS

## 2023-06-05 MED ORDER — FENTANYL CITRATE PF 50 MCG/ML IJ SOSY
50.0000 ug | PREFILLED_SYRINGE | Freq: Once | INTRAMUSCULAR | Status: DC
Start: 2023-06-05 — End: 2023-06-05
  Filled 2023-06-05: qty 1

## 2023-06-05 MED ORDER — ONDANSETRON HCL 4 MG/2ML IJ SOLN
4.0000 mg | Freq: Once | INTRAMUSCULAR | Status: AC
  Administered 2023-06-05: 4 mg via INTRAVENOUS
  Filled 2023-06-05: qty 2

## 2023-06-05 MED ORDER — ONDANSETRON 4 MG PO TBDP: ORAL_TABLET | ORAL | 0 refills | Status: DC

## 2023-06-05 MED ORDER — FENTANYL CITRATE PF 50 MCG/ML IJ SOSY
50.0000 ug | PREFILLED_SYRINGE | Freq: Once | INTRAMUSCULAR | Status: AC
  Administered 2023-06-05: 50 ug via INTRAMUSCULAR

## 2023-06-05 MED ORDER — ONDANSETRON 4 MG PO TBDP
4.0000 mg | ORAL_TABLET | Freq: Once | ORAL | Status: AC
  Administered 2023-06-05: 4 mg via ORAL
  Filled 2023-06-05: qty 1

## 2023-06-05 MED ORDER — HYDROCODONE-ACETAMINOPHEN 5-325 MG PO TABS: ORAL_TABLET | ORAL | 0 refills | Status: DC

## 2023-06-05 MED ORDER — ONDANSETRON 4 MG PO TBDP
4.0000 mg | ORAL_TABLET | Freq: Once | ORAL | Status: AC
Start: 2023-06-05 — End: 2023-06-05
  Administered 2023-06-05: 4 mg via ORAL
  Filled 2023-06-05: qty 1

## 2023-06-05 NOTE — Discharge Instructions (Signed)
Follow-up with [facility] next week.  Drink plenty of fluids.  Return sooner if pain not relieved by medicines or persistent vomiting

## 2023-06-05 NOTE — ED Triage Notes (Signed)
BIB EMS from home for right sided flank pain that radiates into abdomen with N/V. Pt had PO zofran at home. Vomiting on arrival. Hx of kidney stones and feels the same.

## 2023-06-05 NOTE — ED Notes (Signed)
Patient assisted to the restroom at this time to provide a urine sample. Place hat in toilet and educated patient on what to do at this time.

## 2023-06-05 NOTE — ED Provider Notes (Signed)
 [place] [facility] DEPARTMENT AT [facility] Provider Note   CSN: [id number] Arrival date & time: 06/05/23  1405     History  Chief Complaint  Patient presents with   Flank Pain    Jessica [name] is a 73 y.o. female.  Patient has a history of hypertension and diabetes.  She complains of right flank pain  The history is provided by the patient and medical records.  Flank Pain This is a new problem. The current episode started 6 to 12 hours ago. The problem occurs constantly. The problem has not changed since onset.Pertinent negatives include no chest pain, no abdominal pain and no shortness of breath. Nothing relieves the symptoms. She has tried nothing for the symptoms.       Home Medications Prior to Admission medications   Medication Sig Start Date End Date Taking? Authorizing Provider  HYDROcodone -acetaminophen  (NORCO/VICODIN) 5-325 MG tablet Take 1 every 8 hours for pain is not relieved by Tylenol  1 06/05/23  Yes Suzette Pac, MD  ondansetron  (ZOFRAN -ODT) 4 MG disintegrating tablet 4mg  ODT q4 hours prn nausea/vomit 06/05/23  Yes Reather Steller, MD  tamsulosin  (FLOMAX ) 0.4 MG CAPS capsule Take 1 capsule (0.4 mg total) by mouth daily. 06/05/23  Yes Reonna Finlayson, MD  albuterol  (VENTOLIN  HFA) 108 (90 Base) MCG/ACT inhaler TAKE 2 PUFFS BY MOUTH EVERY 6 HOURS AS NEEDED FOR WHEEZE OR SHORTNESS OF BREATH 11/03/21   Jodie Lavern CROME, MD  amLODipine  (NORVASC ) 2.5 MG tablet Take 1 tablet (2.5 mg total) by mouth daily. 04/17/23   Jodie Lavern CROME, MD  atorvastatin  (LIPITOR) 40 MG tablet TAKE 1 TABLET BY MOUTH EVERY DAY 03/30/23   Webb, Padonda B, FNP  celecoxib  (CELEBREX ) 200 MG capsule TAKE 1 CAPSULE BY MOUTH 2 TIMES DAILY BETWEEN MEALS AS NEEDED. 04/30/23   Vernetta Lonni GRADE, MD  dexlansoprazole  (DEXILANT ) 60 MG capsule TAKE 1 CAPSULE BY MOUTH EVERY DAY 07/10/22   Pyrtle, Gordy HERO, MD  Dulaglutide  (TRULICITY ) 0.75 MG/0.5ML SOAJ Inject into the skin weekly 05/17/23   Delores Shields A, DO  furosemide  (LASIX ) 20 MG tablet TAKE 1 TABLET (20 MG TOTAL) BY MOUTH DAILY AS NEEDED FOR FLUID OR EDEMA. Patient taking differently: Take 20 mg by mouth daily. 06/19/22   Jodie Lavern CROME, MD  gabapentin  (NEURONTIN ) 600 MG tablet Take one tablet in the morning and two tablets before bedtime 11/27/22   Gaynel Nest L, DPM  glucose blood (ONETOUCH ULTRA) test strip USE 2 TIMES DAILY. AND LANCETS 2/DAY 11/07/21   Trixie File, MD  insulin  glargine (LANTUS  SOLOSTAR) 100 UNIT/ML Solostar Pen INJECT 30 UNITS INTO THE SKIN AT BEDTIME. 04/02/23   Trixie File, MD  insulin  lispro (HUMALOG  KWIKPEN) 100 UNIT/ML KwikPen INJECT 5-9 UNITS INTO THE SKIN 2 (TWO) TIMES DAILY BEFORE A MEAL. 12/29/22   Trixie File, MD  Insulin  Pen Needle 32G X 4 MM MISC Use 3-4x a day 06/14/22   Trixie File, MD  Insulin  Syringe-Needle U-100 (BD INSULIN  SYRINGE U/F) 31G X 5/16 1 ML MISC USE UP TO 3 TIMES A DAY 05/25/22   Trixie File, MD  ketoconazole  (NIZORAL ) 2 % cream APPLY TWICE A DAY FOR 2 WEEKS AS NEEDED FOR RASH 04/13/20   Jodie Lavern CROME, MD  olmesartan  (BENICAR ) 40 MG tablet TAKE 1 TABLET BY MOUTH EVERY DAY 06/19/22   Jodie Lavern CROME, MD  polyethylene glycol powder ([employer] PURELAX) 17 GM/SCOOP powder Take 17 g by mouth at bedtime. 10/09/22   Jodie Lavern CROME, MD  sucralfate  (  CARAFATE ) 1 GM/10ML suspension Take 10 mLs (1 g total) by mouth daily as needed (nausea). 10/09/22   Jodie Lavern CROME, MD  valACYclovir  (VALTREX ) 500 MG tablet Take 1 tablet (500 mg total) by mouth daily. 12/07/22   Jodie Lavern CROME, MD  Vitamin D , Ergocalciferol , (DRISDOL ) 1.25 MG (50000 UNIT) CAPS capsule Take 1 capsule (50,000 Units total) by mouth every 7 (seven) days. 05/17/23   Delores Shields A, DO      Allergies    Codeine , Gluten meal, Metronidazole, Nsaids, Soy allergy  (do not select), and Wheat    Review of Systems   Review of Systems  Constitutional:  Negative for chills and fever.  HENT:  Negative for ear pain and  sore throat.   Eyes:  Negative for pain and visual disturbance.  Respiratory:  Negative for cough and shortness of breath.   Cardiovascular:  Negative for chest pain and palpitations.  Gastrointestinal:  Negative for abdominal pain and vomiting.  Genitourinary:  Positive for flank pain. Negative for dysuria and hematuria.  Musculoskeletal:  Negative for arthralgias and back pain.  Skin:  Negative for color change and rash.  Neurological:  Negative for seizures and syncope.  All other systems reviewed and are negative.   Physical Exam Updated Vital Signs BP (!) 156/63 (BP Location: Right Arm)   Pulse 63   Temp 98.7 F (37.1 C) (Oral)   Resp 16   Ht 5' 5 (1.651 m)   Wt 95.2 kg   SpO2 100%   BMI 34.93 kg/m  Physical Exam Vitals and nursing note reviewed.  Constitutional:      Appearance: She is well-developed.  HENT:     Head: Normocephalic.     Nose: Nose normal.  Eyes:     General: No scleral icterus.    Conjunctiva/sclera: Conjunctivae normal.  Neck:     Thyroid : No thyromegaly.  Cardiovascular:     Rate and Rhythm: Normal rate and regular rhythm.     Heart sounds: No murmur heard.    No friction rub. No gallop.  Pulmonary:     Breath sounds: No stridor. No wheezing or rales.  Chest:     Chest wall: No tenderness.  Abdominal:     General: There is no distension.     Tenderness: There is no abdominal tenderness. There is no rebound.  Musculoskeletal:        General: Normal range of motion.     Cervical back: Neck supple.     Comments: Tender right flank  Lymphadenopathy:     Cervical: No cervical adenopathy.  Skin:    Findings: No erythema or rash.  Neurological:     Mental Status: She is alert and oriented to person, place, and time.     Motor: No abnormal muscle tone.     Coordination: Coordination normal.  Psychiatric:        Behavior: Behavior normal.     ED Results / Procedures / Treatments   Labs (all labs ordered are listed, but only abnormal  results are displayed) Labs Reviewed  URINALYSIS, ROUTINE W REFLEX MICROSCOPIC - Abnormal; Notable for the following components:      Result Value   APPearance HAZY (*)    Glucose, UA >=500 (*)    Hgb urine dipstick LARGE (*)    Ketones, ur 5 (*)    Protein, ur 30 (*)    All other components within normal limits  COMPREHENSIVE METABOLIC PANEL - Abnormal; Notable for the following components:  Glucose, Bld 195 (*)    BUN 24 (*)    All other components within normal limits  CBC    EKG None  Radiology CT Renal Stone Study Result Date: 06/05/2023 CLINICAL DATA:  Abdominal pain, flank pain. EXAM: CT ABDOMEN AND PELVIS WITHOUT CONTRAST TECHNIQUE: Multidetector CT imaging of the abdomen and pelvis was performed following the standard protocol without IV contrast. RADIATION DOSE REDUCTION: This exam was performed according to the departmental dose-optimization program which includes automated exposure control, adjustment of the mA and/or kV according to patient size and/or use of iterative reconstruction technique. COMPARISON:  None Available. FINDINGS: Lower chest: Lung bases are clear. Hepatobiliary: No focal hepatic lesion. Normal gallbladder. No biliary duct dilatation. Common bile duct is normal. Pancreas: Pancreas is normal. No ductal dilatation. No pancreatic inflammation. Spleen: Normal spleen Adrenals/urinary tract: Adrenal glands normal. There is hydroureter and hydronephrosis of the RIGHT renal collecting system. There is a tiny obstructing calculus in the distal RIGHT vesicoureteral junction measuring 1 mm on image 79/2. There is distension of the distal ureter into the lumen of the bladder. No nephrolithiasis. Stomach/Bowel: Stomach, small bowel, appendix, and cecum are normal. The colon and rectosigmoid colon are normal. Vascular/Lymphatic: Abdominal aorta is normal caliber. No periportal or retroperitoneal adenopathy. No pelvic adenopathy. Reproductive: Uterus and adnexa unremarkable.  Other: No free fluid. Musculoskeletal: No aggressive osseous lesion. IMPRESSION: 1. Tiny obstructing calculus at the distal RIGHT vesicoureteral junction. 2. Moderate hydroureter and hydronephrosis of the RIGHT renal collecting system. 3. No nephrolithiasis. Electronically Signed   By: Jackquline Boxer M.D.   On: 06/05/2023 16:40    Procedures Procedures    Medications Ordered in ED Medications  ondansetron  (ZOFRAN -ODT) disintegrating tablet 4 mg (4 mg Oral Given 06/05/23 1434)  fentaNYL  (SUBLIMAZE ) injection 50 mcg (50 mcg Intramuscular Given 06/05/23 1435)  ondansetron  (ZOFRAN -ODT) disintegrating tablet 4 mg (4 mg Oral Given 06/05/23 1634)  sodium chloride  0.9 % bolus 1,000 mL (1,000 mLs Intravenous New Bag/Given 06/05/23 2104)  ondansetron  (ZOFRAN ) injection 4 mg (4 mg Intravenous Given 06/05/23 2104)    ED Course/ Medical Decision Making/ A&P                                 Medical Decision Making Risk Prescription drug management.   Patient with small right ureteral stone.  Stone is at the right UVJ.  Patient will be sent home with Zofran  Flomax  and hydrocodone  and follow-up with urology        Final Clinical Impression(s) / ED Diagnoses Final diagnoses:  Kidney stone    Rx / DC Orders ED Discharge Orders          Ordered    ondansetron  (ZOFRAN -ODT) 4 MG disintegrating tablet        06/05/23 2211    tamsulosin  (FLOMAX ) 0.4 MG CAPS capsule  Daily        06/05/23 2211    HYDROcodone -acetaminophen  (NORCO/VICODIN) 5-325 MG tablet        06/05/23 2211              Suzette Pac, MD 06/06/23 1014

## 2023-06-05 NOTE — ED Notes (Signed)
Patient d.c with home care instructions with son at bedside.IV discontinued. Offered patient a wheelchair and she politely refused.

## 2023-06-05 NOTE — ED Provider Triage Note (Signed)
 Emergency Medicine Provider Triage Evaluation Note  Jessica [name] , a 73 y.o. female  was evaluated in triage.  Pt complains of flank pain.  Reports left-sided flank pain that radiates to the right lower quadrant.  Began about 2 hours prior to arrival.  Associated nausea.  No vomiting.  Review of Systems  Positive: Right flank pain, right lower quadrant pain Negative: Diarrhea, fever  Physical Exam  BP (!) 192/69   Pulse (!) 57   Temp 98.1 F (36.7 C) (Oral)   Resp 16   Ht 5' 5 (1.651 m)   Wt 95.2 kg   SpO2 98%   BMI 34.93 kg/m  Gen:   Awake, no distress   Resp:  Normal effort  MSK:   Moves extremities without difficulty  Other:    Medical Decision Making  Medically screening exam initiated at 2:28 PM.  Appropriate orders placed.  Shakeila Pfarr Devito was informed that the remainder of the evaluation will be completed by another provider, this initial triage assessment does not replace that evaluation, and the importance of remaining in the ED until their evaluation is complete.   Waddell Sluder, PA-C 06/05/23 1430

## 2023-06-06 ENCOUNTER — Ambulatory Visit: Payer: Medicare Other | Admitting: Orthopaedic Surgery

## 2023-06-14 ENCOUNTER — Other Ambulatory Visit: Payer: Self-pay | Admitting: Family Medicine

## 2023-06-19 ENCOUNTER — Encounter: Payer: Self-pay | Admitting: Bariatrics

## 2023-06-19 ENCOUNTER — Ambulatory Visit (INDEPENDENT_AMBULATORY_CARE_PROVIDER_SITE_OTHER): Payer: Medicare Other | Admitting: Bariatrics

## 2023-06-19 ENCOUNTER — Other Ambulatory Visit: Payer: Self-pay | Admitting: Internal Medicine

## 2023-06-19 VITALS — BP 157/66 | HR 73 | Temp 97.9°F | Ht 65.0 in | Wt 211.0 lb

## 2023-06-19 DIAGNOSIS — I10 Essential (primary) hypertension: Secondary | ICD-10-CM | POA: Diagnosis not present

## 2023-06-19 DIAGNOSIS — Z794 Long term (current) use of insulin: Secondary | ICD-10-CM

## 2023-06-19 DIAGNOSIS — E119 Type 2 diabetes mellitus without complications: Secondary | ICD-10-CM

## 2023-06-19 DIAGNOSIS — Z7985 Long-term (current) use of injectable non-insulin antidiabetic drugs: Secondary | ICD-10-CM

## 2023-06-19 DIAGNOSIS — E669 Obesity, unspecified: Secondary | ICD-10-CM | POA: Diagnosis not present

## 2023-06-19 DIAGNOSIS — Z6835 Body mass index (BMI) 35.0-35.9, adult: Secondary | ICD-10-CM | POA: Diagnosis not present

## 2023-06-19 MED ORDER — TRULICITY 1.5 MG/0.5ML ~~LOC~~ SOAJ: 1.5000 mg | SUBCUTANEOUS | 0 refills | Status: DC

## 2023-06-19 MED ORDER — ONDANSETRON HCL 4 MG PO TABS: 4.0000 mg | ORAL_TABLET | Freq: Three times a day (TID) | ORAL | 0 refills | Status: DC | PRN

## 2023-06-19 NOTE — Progress Notes (Signed)
WEIGHT SUMMARY AND BIOMETRICS  Weight Lost Since Last Visit: 0lb  Weight Gained Since Last Visit: 1lb   Vitals Temp: 97.9 F (36.6 C) BP: (!) 157/66 Pulse Rate: 73 SpO2: 99 %   Anthropometric Measurements Height: 5\' 5"  (1.651 m) Weight: 211 lb (95.7 kg) BMI (Calculated): 35.11 Weight at Last Visit: 210lb Weight Lost Since Last Visit: 0lb Weight Gained Since Last Visit: 1lb Starting Weight: 246lb Total Weight Loss (lbs): 35 lb (15.9 kg)   Body Composition  Body Fat %: 46.8 % Fat Mass (lbs): 98.8 lbs Muscle Mass (lbs): 106.8 lbs Total Body Water (lbs): 80.6 lbs Visceral Fat Rating : 15   Other Clinical Data Fasting: No Labs: No Today's Visit #: 48 Starting Date: 03/12/19    OBESITY Jessica [name] is here to discuss her progress with her obesity treatment plan along with follow-up of her obesity related diagnoses.    Nutrition Plan: the Category 3 plan - 95% adherence.  Current exercise:  elliptical   Interim History:  She gained 1 lb since her last visit. She has had a "kidney stone" since her last visit. She went to the ER and she finally passed the stone. She had to take Zofran for nausea. She is going to have a stone work-up in the near future. She has been back on her plan.  Eating all of the food on the plan., Protein intake is less than prescribed., Is skipping meals, Water intake is adequate., and Denies polyphagia   Pharmacotherapy: Jessica [name] is on Trulicity 0.75 mg SQ weekly Adverse side effects: Nausea, occasional Hunger is moderately controlled.  Cravings are moderately controlled.  Assessment/Plan:   Type II Diabetes HgbA1c is at goal. Last A1c was 6.1 CBGs: Fasting 120 to 130       Episodes of hypoglycemia: no Medication(s): Trulicity 0.75 mg SQ weekly  Lab Results  Component Value Date   HGBA1C 6.1 (A) 04/10/2023   HGBA1C 6.3 (H)  08/31/2022   HGBA1C 7.2 (A) 06/14/2022   Lab Results  Component Value Date   MICROALBUR 0.9 04/17/2023   LDLCALC 86 04/17/2023   CREATININE 0.83 06/05/2023   Lab Results  Component Value Date   GFR 59.84 (L) 04/17/2023   GFR 57.36 (L) 04/12/2022   GFR 60.62 06/09/2021    Plan: Continue and increase dose Trulicity 1.5 mg SQ weekly Will keep all carbohydrates low both sweets and starches.  Will continue exercise regimen to 30 to 60 minutes on most days of the week.  Aim for 7 to 9 hours of sleep nightly.  Eat more low glycemic index foods.  Will continue to cook more meals at home. Will eat consistent meals. Will have a protein shake if she does not eat a meal.  She will use her journaling sheet more consistently and stay within her calories protein levels.  Hypertension Hypertension control uncertain.  Medication(s): Benicar  40 mg once daily , Norvasc 2.5 mg  BP Readings from Last 3 Encounters:  06/19/23 (!) 157/66  06/05/23 (!) 156/63  05/17/23 (!) 143/77   Lab Results  Component Value Date   CREATININE 0.83 06/05/2023   CREATININE 0.95 04/17/2023   CREATININE 0.93 09/13/2022   Lab Results  Component Value Date   GFR 59.84 (L) 04/17/2023   GFR 57.36 (L) 04/12/2022   GFR 60.62 06/09/2021    Plan: Continue all antihypertensives at current dosages. No added salt. Will keep sodium content to 1,500 mg or less per day.     Generalized Obesity: Current BMI BMI (Calculated): 35.11   Pharmacotherapy Plan Continue and increase dose  Trulicity 1.5 mg SQ weekly  Jessica [name] is currently in the action stage of change. As such, her goal is to continue with weight loss efforts.  She has agreed to the Category 3 plan.  Exercise goals: will add in exercise as tolerated.   Behavioral modification strategies: increasing lean protein intake, meal planning , increase water intake, better snacking choices, planning for success, avoiding temptations, and mindful eating.  Jessica [name]  has agreed to follow-up with our clinic in 4 weeks.     Objective:   VITALS: Per patient if applicable, see vitals. GENERAL: Alert and in no acute distress. CARDIOPULMONARY: No increased WOB. Speaking in clear sentences.  PSYCH: Pleasant and cooperative. Speech normal rate and rhythm. Affect is appropriate. Insight and judgement are appropriate. Attention is focused, linear, and appropriate.  NEURO: Oriented as arrived to appointment on time with no prompting.   Attestation Statements:   This was prepared with the assistance of Engineer, civil (consulting).  Occasional wrong-word or sound-a-like substitutions may have occurred due to the inherent limitations of voice recognition   Corinna Capra, DO

## 2023-06-20 ENCOUNTER — Other Ambulatory Visit: Payer: Self-pay | Admitting: Internal Medicine

## 2023-06-22 ENCOUNTER — Telehealth: Payer: Self-pay | Admitting: Internal Medicine

## 2023-06-25 ENCOUNTER — Encounter: Payer: Self-pay | Admitting: Orthopaedic Surgery

## 2023-06-25 ENCOUNTER — Other Ambulatory Visit (INDEPENDENT_AMBULATORY_CARE_PROVIDER_SITE_OTHER): Payer: Medicare Other

## 2023-06-25 ENCOUNTER — Ambulatory Visit (INDEPENDENT_AMBULATORY_CARE_PROVIDER_SITE_OTHER): Payer: Medicare Other | Admitting: Orthopaedic Surgery

## 2023-06-25 DIAGNOSIS — Z96651 Presence of right artificial knee joint: Secondary | ICD-10-CM

## 2023-06-25 NOTE — Progress Notes (Signed)
 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  She says she is very satisfied with the knee replacement.  She still has stiffness and is working on range of motion.  She said she is satisfied with her motion even though she knows that I will not be.  On exam her right knee is straight.  With flexion though she is only at 90 degrees or just barely beyond that.  However the knee is stable and she is very satisfied and does not have any significant pain.  There is only a little bit of swelling to be expected.  Her left knee has valgus malalignment but excellent range of motion and is pain-free.  2 views of the right knee show well-seated total knee arthroplasty in good alignment.  She is very satisfied with her knee so she can follow-up for the right knee as needed.  If things worsen in any way or if she has any issues she knows to let us know or if her other knee starts bothering her she will let us know.

## 2023-06-26 ENCOUNTER — Other Ambulatory Visit: Payer: Self-pay | Admitting: Family Medicine

## 2023-06-28 ENCOUNTER — Other Ambulatory Visit: Payer: Self-pay | Admitting: Orthopaedic Surgery

## 2023-06-28 NOTE — Progress Notes (Signed)
 [name] [address] [place] [address] Phone: [phone] Subjective:    I'm seeing this patient by the request  of:  Willow Ora, MD  CC: left knee pain   [id number]  08/07/2022 Patient given injection and tolerated the procedure well, discussed icing regimen and home exercises.  Discussed which activities to do and which ones to avoid.  Follow-up with me again in 6 to 8 weeks after surgery avoided any type of steroid secondary to patient going to have recent surgery.      Update 07/03/2023 Carolle Ishii Gilland is a 73 y.o. female coming in with complaint of L knee pain. TKR right side May 2024. Patient states neck is acting up. Pain for about 3-4 weeks.       Past Medical History:  Diagnosis Date   Anxiety    Arthritis    bilateral knees   Asthma    childhood, inhaler for wheezing PRN   Autonomic neuropathy    diabetic   Cataract    Celiac disease    Constipation    Depression    Diabetes mellitus    type II, Hemoglobin A1C 9.9 10/05/2011   Diabetic neuropathy (HCC)    Diverticulosis    Fatty liver    Gastroparesis    GERD (gastroesophageal reflux disease)    History of claustrophobia    with MRI tests   Hyperlipidemia    Hypertension    IBS (irritable bowel syndrome)    Kidney stones    Personal history of colonic polyps 03/2010   hyperplastic   PONV (postoperative nausea and vomiting)    Shingles 2021   Past Surgical History:  Procedure Laterality Date   APPENDECTOMY     CATARACT EXTRACTION Right 04/29/2018   CATARACT EXTRACTION Left 03/2018   COLONOSCOPY     DILATATION & CURRETTAGE/HYSTEROSCOPY WITH RESECTOCOPE N/A 03/24/2013   Procedure: DILATATION & CURETTAGE, HYSTEROSCOPY WITH RESECTION;  Surgeon: Levi Aland, MD;  Location: [facility];  Service: Gynecology;  Laterality: N/A;   HYSTEROSCOPY WITH D & C N/A 11/08/2015   Procedure: DILATATION AND CURETTAGE /HYSTEROSCOPY;  Surgeon: Levi Aland, MD;  Location:  [facility];  Service: Gynecology;  Laterality: N/A;   left breast cyst removal  2000   LEFT HEART CATH AND CORONARY ANGIOGRAPHY N/A 10/31/2018   Procedure: LEFT HEART CATH AND CORONARY ANGIOGRAPHY;  Surgeon: Corky Crafts, MD;  Location: [facility] INVASIVE CV LAB;  Service: Cardiovascular;  Laterality: N/A;   TOTAL KNEE ARTHROPLASTY Right 09/12/2022   Procedure: RIGHT TOTAL KNEE ARTHROPLASTY;  Surgeon: Kathryne Hitch, MD;  Location: [facility];  Service: Orthopedics;  Laterality: Right;   TUBAL LIGATION     Social History   Socioeconomic History   Marital status: Married    Spouse name: Devar Pong   Number of children: 2   Years of education: Not on file   Highest education level: Not on file  Occupational History   Occupation: Conservation officer, 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    Comment: parttime   Occupation: retired  Tobacco Use   Smoking status: Never   Smokeless tobacco: Never  Vaping Use   Vaping status: Never Used  Substance and Sexual Activity   Alcohol use: No   Drug use: No   Sexual activity: Yes    Birth control/protection: Post-menopausal, Surgical  Other Topics Concern   Not on file  Social History Narrative   Not on file   Social  Drivers of Health   Financial Resource Strain: Low Risk  (03/22/2023)   Overall Financial Resource Strain (CARDIA)    Difficulty of Paying Living Expenses: Not hard at all  Food Insecurity: No Food Insecurity (03/22/2023)   Hunger Vital Sign    Worried About Running Out of Food in the Last Year: Never true    Ran Out of Food in the Last Year: Never true  Transportation Needs: No Transportation Needs (03/22/2023)   PRAPARE - Administrator, Civil Service (Medical): No    Lack of Transportation (Non-Medical): No  Physical Activity: Sufficiently Active (03/22/2023)   Exercise Vital Sign    Days of Exercise per Week: 5 days    Minutes of Exercise per Session: 30 min  Stress: Stress Concern Present (03/22/2023)    [employer] of Occupational Health - Occupational Stress Questionnaire    Feeling of Stress : Rather much  Social Connections: Socially Integrated (03/22/2023)   Social Connection and Isolation Panel [NHANES]    Frequency of Communication with Friends and Family: More than three times a week    Frequency of Social Gatherings with Friends and Family: Twice a week    Attends Religious Services: More than 4 times per year    Active Member of [employer] or Organizations: Yes    Attends Banker Meetings: 1 to 4 times per year    Marital Status: Married   Allergies  Allergen Reactions   Codeine Nausea And Vomiting   Gluten Meal Other (See Comments)    No wheat or soy   Metronidazole Nausea And Vomiting   Nsaids Nausea And Vomiting and Other (See Comments)    Cannot tolerate large doses d/t Celiac disease    Soy Allergy (Obsolete)    Wheat    Family History  Problem Relation Age of Onset   Hypertension Mother    Colon cancer Sister        dx in her early 92s   Diabetes Sister    Endometrial cancer Sister    Hypertension Brother    Other Father        2 collapsed lungs   COPD Father    Diabetes Father    Heart attack Father    Heart failure Father    High blood pressure Father    Colon polyps Neg Hx    Esophageal cancer Neg Hx    Stomach cancer Neg Hx     Current Outpatient Medications (Endocrine & Metabolic):    Dulaglutide (TRULICITY) 1.5 MG/0.5ML SOAJ, Inject 1.5 mg into the skin once a week.   insulin glargine (LANTUS SOLOSTAR) 100 UNIT/ML Solostar Pen, INJECT 30 UNITS INTO THE SKIN AT BEDTIME.   insulin lispro (HUMALOG KWIKPEN) 100 UNIT/ML KwikPen, INJECT 5-9 UNITS INTO THE SKIN 2 (TWO) TIMES DAILY BEFORE A MEAL.  Current Outpatient Medications (Cardiovascular):    amLODipine (NORVASC) 2.5 MG tablet, Take 1 tablet (2.5 mg total) by mouth daily.   atorvastatin (LIPITOR) 40 MG tablet, TAKE 1 TABLET BY MOUTH EVERY DAY   furosemide (LASIX) 20 MG tablet, TAKE  1 TABLET (20 MG TOTAL) BY MOUTH DAILY AS NEEDED FOR FLUID OR EDEMA.   olmesartan (BENICAR) 40 MG tablet, TAKE 1 TABLET BY MOUTH EVERY DAY  Current Outpatient Medications (Respiratory):    albuterol (VENTOLIN HFA) 108 (90 Base) MCG/ACT inhaler, TAKE 2 PUFFS BY MOUTH EVERY 6 HOURS AS NEEDED FOR WHEEZE OR SHORTNESS OF BREATH  Current Outpatient Medications (Analgesics):    celecoxib (CELEBREX) 200  MG capsule, TAKE 1 CAPSULE BY MOUTH 2 TIMES DAILY BETWEEN MEALS AS NEEDED.   HYDROcodone-acetaminophen (NORCO/VICODIN) 5-325 MG tablet, Take 1 every 8 hours for pain is not relieved by Tylenol 1   Current Outpatient Medications (Other):    BD PEN NEEDLE NANO 2ND GEN 32G X 4 MM MISC, USE 3-4X A DAY   dexlansoprazole (DEXILANT) 60 MG capsule, TAKE 1 CAPSULE BY MOUTH EVERY DAY   gabapentin (NEURONTIN) 600 MG tablet, Take one tablet in the morning and two tablets before bedtime   glucose blood (ONETOUCH ULTRA) test strip, USE 2 TIMES DAILY. AND LANCETS 2/DAY   Insulin Syringe-Needle U-100 (BD INSULIN SYRINGE U/F) 31G X 5/16" 1 ML MISC, USE UP TO 3 TIMES A DAY   ketoconazole (NIZORAL) 2 % cream, APPLY TWICE A DAY FOR 2 WEEKS AS NEEDED FOR RASH   ondansetron (ZOFRAN) 4 MG tablet, Take 1 tablet (4 mg total) by mouth every 8 (eight) hours as needed for nausea or vomiting.   ondansetron (ZOFRAN-ODT) 4 MG disintegrating tablet, 4mg  ODT q4 hours prn nausea/vomit   polyethylene glycol powder ([employer] PURELAX) 17 GM/SCOOP powder, Take 17 g by mouth at bedtime.   sucralfate (CARAFATE) 1 GM/10ML suspension, Take 10 mLs (1 g total) by mouth daily as needed (nausea).   tamsulosin (FLOMAX) 0.4 MG CAPS capsule, Take 1 capsule (0.4 mg total) by mouth daily.   valACYclovir (VALTREX) 500 MG tablet, Take 1 tablet (500 mg total) by mouth daily.   Vitamin D, Ergocalciferol, (DRISDOL) 1.25 MG (50000 UNIT) CAPS capsule, Take 1 capsule (50,000 Units total) by mouth every 7 (seven) days.   Reviewed prior external information  including notes and imaging from  primary care provider As well as notes that were available from care everywhere and other healthcare systems.  Past medical history, social, surgical and family history all reviewed in electronic medical record.  No pertanent information unless stated regarding to the chief complaint.   Review of Systems:  No headache, visual changes, nausea, vomiting, diarrhea, constipation, dizziness, abdominal pain, skin rash, fevers, chills, night sweats, weight loss, swollen lymph nodes, body aches, joint swelling, chest pain, shortness of breath, mood changes. POSITIVE muscle aches  Objective  Blood pressure 138/82, pulse 86, height 5\' 5"  (1.651 m), weight 211 lb (95.7 kg), SpO2 98%.   General: No apparent distress alert and oriented x3 mood and affect normal, dressed appropriately.  HEENT: Pupils equal, extraocular movements intact  Respiratory: Patient's speak in full sentences and does not appear short of breath  Cardiovascular: No lower extremity edema, non tender, no erythema  Left knee exam shows relatively good range of motion noted.  No significant instability noted of the knee. Neck exam does have some loss lordosis noted.  Significant tightness noted on the right side of the neck with multiple trigger points noted in the left her scapula, trapezius, and rhomboid musculature.  After verbal consent patient was prepped with alcohol swab and with a 21-gauge 2 inch needle injected into 4 distinct trigger points in the left her scapula, trapezius and rhomboid musculature.  Total of 3 cc 0.5% Marcaine and 1 cc of Kenalog 40 mg/mL.  No blood loss.  Band-Aid placed.  Postinjection instructions given.      Impression and Recommendations:    The above documentation has been reviewed and is accurate and complete Judi Saa, DO

## 2023-07-03 ENCOUNTER — Ambulatory Visit: Payer: Medicare Other | Admitting: Family Medicine

## 2023-07-03 ENCOUNTER — Encounter: Payer: Self-pay | Admitting: Family Medicine

## 2023-07-03 VITALS — BP 138/82 | HR 86 | Ht 65.0 in | Wt 211.0 lb

## 2023-07-03 DIAGNOSIS — M25511 Pain in right shoulder: Secondary | ICD-10-CM

## 2023-07-03 NOTE — Assessment & Plan Note (Signed)
 Has been 2-1/2-year since previous injections that has improved previously.  Does have some radicular symptoms also noted from patient's degenerative disc disease of the neck that we need to continue to monitor.  Discussed icing regimen and home exercises, discussed which activities to do and which ones to avoid.  Increase activity slowly.  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  Follow-up again in 6 to 8 weeks

## 2023-07-03 NOTE — Patient Instructions (Addendum)
 Scap HEP  5-6 week follow up

## 2023-07-04 ENCOUNTER — Other Ambulatory Visit: Payer: Self-pay | Admitting: Internal Medicine

## 2023-07-04 DIAGNOSIS — E1165 Type 2 diabetes mellitus with hyperglycemia: Secondary | ICD-10-CM

## 2023-07-17 ENCOUNTER — Encounter: Payer: Self-pay | Admitting: Family Medicine

## 2023-07-17 ENCOUNTER — Ambulatory Visit (INDEPENDENT_AMBULATORY_CARE_PROVIDER_SITE_OTHER): Payer: Medicare Other | Admitting: Family Medicine

## 2023-07-17 VITALS — BP 124/73 | HR 62 | Temp 97.7°F | Ht 65.0 in | Wt 209.8 lb

## 2023-07-17 DIAGNOSIS — Z794 Long term (current) use of insulin: Secondary | ICD-10-CM | POA: Diagnosis not present

## 2023-07-17 DIAGNOSIS — E538 Deficiency of other specified B group vitamins: Secondary | ICD-10-CM | POA: Diagnosis not present

## 2023-07-17 DIAGNOSIS — E1143 Type 2 diabetes mellitus with diabetic autonomic (poly)neuropathy: Secondary | ICD-10-CM

## 2023-07-17 DIAGNOSIS — E1169 Type 2 diabetes mellitus with other specified complication: Secondary | ICD-10-CM

## 2023-07-17 DIAGNOSIS — E1142 Type 2 diabetes mellitus with diabetic polyneuropathy: Secondary | ICD-10-CM

## 2023-07-17 DIAGNOSIS — E782 Mixed hyperlipidemia: Secondary | ICD-10-CM

## 2023-07-17 DIAGNOSIS — R252 Cramp and spasm: Secondary | ICD-10-CM

## 2023-07-17 DIAGNOSIS — K3184 Gastroparesis: Secondary | ICD-10-CM

## 2023-07-17 LAB — POCT GLYCOSYLATED HEMOGLOBIN (HGB A1C): Hemoglobin A1C: 6.2 % — AB (ref 4.0–5.6)

## 2023-07-17 MED ORDER — ATORVASTATIN CALCIUM 80 MG PO TABS: 80.0000 mg | ORAL_TABLET | Freq: Every day | ORAL | 3 refills | Status: AC

## 2023-07-17 NOTE — Patient Instructions (Signed)
 Please return in 3 months to recheck blood pressure, lipids and vit b12  If you have any questions or concerns, please don't hesitate to send me a message via MyChart or call the office at [phone]. Thank you for visiting with [place] today! It's our pleasure caring for you.   VISIT SUMMARY:  During today's visit, we discussed your recent leg cramps and muscle spasms, your history of kidney stones, and your current medications. We also reviewed your blood pressure, cholesterol levels, and vitamin B12 supplementation. Additionally, we talked about your upcoming breast exam and the need to reschedule your eye appointment.  YOUR PLAN:  -LEG CRAMPS AND MUSCLE SPASMS: Your severe leg cramps and muscle spasms, especially at night, may be due to an imbalance in electrolytes or dehydration. We recommend taking an over-the-counter magnesium supplement (200-250 mg) and drinking tonic water with lime. Regular stretching exercises can also help alleviate these symptoms.  -NEPHROLITHIASIS (KIDNEY STONES): You recently had a severe episode of kidney stones, which required emergency treatment. To manage this, take tamsulosin during episodes and ensure you stay well-hydrated. You will follow up with a specialist for a dietary workup and a urine sample kit to help prevent future stones.  -HYPERLIPIDEMIA (HIGH CHOLESTEROL): Your cholesterol levels have improved but are not yet optimal. Continue taking Lipitor, with the dosage increased to 80 mg at night.  -HYPERTENSION (HIGH BLOOD PRESSURE): Your blood pressure is well-controlled at 120/70 mmHg. Continue with your current antihypertensive medication regimen.  -VITAMIN B12 SUPPLEMENTATION: You are compliant with your vitamin B12 supplementation at 1000 mcg. Continue with this current regimen.  -GENERAL HEALTH MAINTENANCE: You have a breast exam scheduled for April and need to reschedule your eye appointment. Please ensure you complete these important health  checks.  INSTRUCTIONS:  Please schedule a follow-up appointment in three months for blood work and reassessment of your conditions.

## 2023-07-17 NOTE — Progress Notes (Signed)
 Subjective  CC:  Chief Complaint  Patient presents with   Diabetes   Hypertension   Hyperlipidemia    HPI: Jessica [name] is a 73 y.o. female who presents to the office today to address the problems listed above in the chief complaint. Discussed the use of AI scribe software for clinical note transcription with the patient, who gave verbal consent to proceed.  History of Present Illness   Jessica [name] is a 73 year old female with hypertension, DM and HLD who presents for f/u.  She experiences severe leg cramps and muscle spasms, particularly when lying down, causing her feet to turn inward and sometimes resulting in [place] horses. These symptoms have recently started and are causing significant discomfort. She has been trying various remedies, including using a lotion that may contain magnesium.  She has a history of kidney stones, with the most recent episode occurring since her last visit. This episode was severe, leading to dehydration and prolonged nausea, requiring an emergency room visit where she was treated for about eight to nine hours. She was prescribed tamsulosin to take as needed for kidney stones but is unsure when to use it. She is awaiting a follow-up for a dietary workup to prevent future stones.  HTN f/u: we added low dose amlodipine at her last visit 3 months ago. Her blood pressure has improved with recent adjustments, now measuring in the 120s over 70s. Feeling well. Taking medications w/o adverse effects. No symptoms of CHF, angina; no palpitations, sob, cp or lower extremity edema. Compliant with meds.   Hyperlipidemia follow-up: LDL was 86 at last check.  She is on Lipitor 40 and tolerates this very well.  She is open to increasing the dose to get to goal.  Diet is good.  Seeing healthy weight and wellness.  Weight continues to trend downward  Diabetic care is stable.  He has eye exam scheduled.  Health maintenance: She has scheduled a breast exam for April  and had to reschedule an eye appointment due to snow.       Assessment  1. Type 2 diabetes mellitus with diabetic polyneuropathy, with long-term current use of insulin (HCC)   2. Combined hyperlipidemia associated with type 2 diabetes mellitus (HCC)   3. Vitamin B12 deficiency   4. Diabetic polyneuropathy associated with type 2 diabetes mellitus (HCC)   5. 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)   6. Diabetic gastroparesis (HCC)   7. Leg cramps      Plan   Assessment and Plan    Leg cramps and muscle spasms nocturnal leg cramps and spasms, possibly due to electrolyte imbalance or dehydration. - Recommend magnesium supplement, 200-250 mg OTC. - Advise drinking water and or tonic water with lime. - Encourage regular stretching exercises.  Nephrolithiasis Recent kidney stone episode with severe symptoms. Awaiting dietary workup. - Advise taking tamsulosin during episodes. - Encourage hydration. - Follow up with specialist for dietary workup and urine sample kit.  Hyperlipidemia Cholesterol levels improved but not optimal. Tolerates Lipitor well. - Increase Lipitor dosage to 80 mg at night.  Hypertension Blood pressure well-controlled at 120/70 mmHg. - Continue current antihypertensive regimen.  Vitamin B12 supplementation Compliant with 1000 mcg supplementation. B12 levels not checked this visit. - Continue current vitamin B12 supplementation regimen.  General Health Maintenance Scheduled breast exam and rescheduled eye appointment. - Ensure completion of breast exam in April. - Reschedule and attend eye appointment.  Follow-up Return in 3 months  to recheck B12 levels, cholesterol and LFTs.     Education regarding management of these chronic disease states was given. Management strategies discussed on successive visits include dietary and exercise recommendations, goals of achieving and maintaining IBW, and lifestyle modifications  aiming for adequate sleep and minimizing stressors.    Orders Placed This Encounter  Procedures   POCT HgB A1C   Meds ordered this encounter  Medications   atorvastatin (LIPITOR) 80 MG tablet    Sig: Take 1 tablet (80 mg total) by mouth at bedtime.    Dispense:  90 tablet    Refill:  3      BP Readings from Last 3 Encounters:  07/17/23 124/73  07/03/23 138/82  06/19/23 (!) 157/66   Wt Readings from Last 3 Encounters:  07/17/23 209 lb 12.8 oz (95.2 kg)  07/03/23 211 lb (95.7 kg)  06/19/23 211 lb (95.7 kg)    Lab Results  Component Value Date   CHOL 158 04/17/2023   CHOL 145 04/12/2022   CHOL 135 04/13/2021   Lab Results  Component Value Date   HDL 61.10 04/17/2023   HDL 54.40 04/12/2022   HDL 59.70 04/13/2021   Lab Results  Component Value Date   LDLCALC 86 04/17/2023   LDLCALC 72 04/12/2022   LDLCALC 65 04/13/2021   Lab Results  Component Value Date   TRIG 55.0 04/17/2023   TRIG 93.0 04/12/2022   TRIG 50.0 04/13/2021   Lab Results  Component Value Date   CHOLHDL 3 04/17/2023   CHOLHDL 3 04/12/2022   CHOLHDL 2 04/13/2021   Lab Results  Component Value Date   LDLDIRECT 143.2 11/23/2006   Lab Results  Component Value Date   CREATININE 0.83 06/05/2023   BUN 24 (H) 06/05/2023   NA 137 06/05/2023   K 3.8 06/05/2023   CL 104 06/05/2023   CO2 23 06/05/2023    The 10-year ASCVD risk score (Arnett DK, et al., 2019) is: 23.2%   Values used to calculate the score:     Age: 69 years     Sex: Female     Is Non-Hispanic African American: Yes     Diabetic: Yes     Tobacco smoker: No     Systolic Blood Pressure: 124 mmHg     Is BP treated: Yes     HDL Cholesterol: 61.1 mg/dL     Total Cholesterol: 158 mg/dL  I reviewed the patients updated PMH, FH, and SocHx.    Patient Active Problem List   Diagnosis Date Noted   Morbid obesity (HCC) 04/13/2022    Priority: High   Peripheral sensory neuropathy due to type 2 diabetes mellitus (HCC) 04/05/2020     Priority: High   Celiac disease 02/14/2018    Priority: High   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) 02/14/2018    Priority: High   Class 2 obesity due to excess calories with body mass index (BMI) of 37.0 to 37.9 in adult 02/14/2018    Priority: High   Type 2 diabetes mellitus with complication, with long-term current use of insulin (HCC) 09/23/2010    Priority: High   Combined hyperlipidemia associated with type 2 diabetes mellitus (HCC) 01/29/2009    Priority: High   Essential hypertension 10/22/2006    Priority: High   Status post total right knee replacement 09/12/2022    Priority: Medium    Degenerative arthritis of left knee 08/07/2022    Priority: Medium    Gastroesophageal reflux disease  04/18/2022    Priority: Medium    Family history of malignant neoplasm of endometrium 10/08/2018    Priority: Medium    Osteopenia 02/14/2018    Priority: Medium    Mild intermittent asthma 02/14/2018    Priority: Medium    Degenerative cervical disc 07/27/2015    Priority: Medium    OSA (obstructive sleep apnea) 02/28/2011    Priority: Medium    Constipation, slow transit 09/23/2010    Priority: Medium    FH: colon cancer 09/23/2010    Priority: Medium    History of colonic polyps 09/23/2010    Priority: Medium    Diabetic gastroparesis (HCC) 02/27/2007    Priority: Medium    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) 10/22/2006    Priority: Medium    Chronic rhinitis 04/18/2022    Priority: Low   Vitamin D deficiency 04/13/2022    Priority: Low   Bilateral lower extremity edema 01/20/2020    Priority: Low   Fibroma of tongue 06/06/2016    Priority: Low   Renal calculi 01/13/2016    Priority: Low   Vitamin B12 deficiency 07/17/2023   Multinodular goiter (nontoxic) 04/17/2023   Trigger point of right shoulder region 08/02/2015    Allergies: Codeine, Gluten meal, Metronidazole, Nsaids, Soy allergy (obsolete),  and Wheat  Social History: Patient  reports that she has never smoked. She has never used smokeless tobacco. She reports that she does not drink alcohol and does not use drugs.  Current Meds  Medication Sig   albuterol (VENTOLIN HFA) 108 (90 Base) MCG/ACT inhaler TAKE 2 PUFFS BY MOUTH EVERY 6 HOURS AS NEEDED FOR WHEEZE OR SHORTNESS OF BREATH   amLODipine (NORVASC) 2.5 MG tablet Take 1 tablet (2.5 mg total) by mouth daily.   atorvastatin (LIPITOR) 80 MG tablet Take 1 tablet (80 mg total) by mouth at bedtime.   BD PEN NEEDLE NANO 2ND GEN 32G X 4 MM MISC USE 3-4X A DAY   celecoxib (CELEBREX) 200 MG capsule TAKE 1 CAPSULE BY MOUTH 2 TIMES DAILY BETWEEN MEALS AS NEEDED.   dexlansoprazole (DEXILANT) 60 MG capsule TAKE 1 CAPSULE BY MOUTH EVERY DAY   Dulaglutide (TRULICITY) 1.5 MG/0.5ML SOAJ Inject 1.5 mg into the skin once a week.   furosemide (LASIX) 20 MG tablet TAKE 1 TABLET (20 MG TOTAL) BY MOUTH DAILY AS NEEDED FOR FLUID OR EDEMA.   gabapentin (NEURONTIN) 600 MG tablet Take one tablet in the morning and two tablets before bedtime   glucose blood (ONETOUCH ULTRA) test strip USE 2 TIMES DAILY. AND LANCETS 2/DAY   HYDROcodone-acetaminophen (NORCO/VICODIN) 5-325 MG tablet Take 1 every 8 hours for pain is not relieved by Tylenol 1   insulin glargine (LANTUS SOLOSTAR) 100 UNIT/ML Solostar Pen Inject 25 Units into the skin at bedtime.   insulin lispro (HUMALOG KWIKPEN) 100 UNIT/ML KwikPen INJECT 5-9 UNITS INTO THE SKIN 2 (TWO) TIMES DAILY BEFORE A MEAL.   Insulin Syringe-Needle U-100 (BD INSULIN SYRINGE U/F) 31G X 5/16" 1 ML MISC USE UP TO 3 TIMES A DAY   ketoconazole (NIZORAL) 2 % cream APPLY TWICE A DAY FOR 2 WEEKS AS NEEDED FOR RASH   olmesartan (BENICAR) 40 MG tablet TAKE 1 TABLET BY MOUTH EVERY DAY   ondansetron (ZOFRAN) 4 MG tablet Take 1 tablet (4 mg total) by mouth every 8 (eight) hours as needed for nausea or vomiting.   ondansetron (ZOFRAN-ODT) 4 MG disintegrating tablet 4mg  ODT q4 hours prn  nausea/vomit  polyethylene glycol powder ([employer]S PURELAX) 17 GM/SCOOP powder Take 17 g by mouth at bedtime.   sucralfate (CARAFATE) 1 GM/10ML suspension Take 10 mLs (1 g total) by mouth daily as needed (nausea).   tamsulosin (FLOMAX) 0.4 MG CAPS capsule Take 1 capsule (0.4 mg total) by mouth daily.   valACYclovir (VALTREX) 500 MG tablet Take 1 tablet (500 mg total) by mouth daily.   Vitamin D, Ergocalciferol, (DRISDOL) 1.25 MG (50000 UNIT) CAPS capsule Take 1 capsule (50,000 Units total) by mouth every 7 (seven) days.   [DISCONTINUED] atorvastatin (LIPITOR) 40 MG tablet TAKE 1 TABLET BY MOUTH EVERY DAY    Review of Systems: Cardiovascular: negative for chest pain, palpitations, leg swelling, orthopnea Respiratory: negative for SOB, wheezing or persistent cough Gastrointestinal: negative for abdominal pain Genitourinary: negative for dysuria or gross hematuria  Objective  Vitals: BP 124/73   Pulse 62   Temp 97.7 F (36.5 C)   Ht 5\' 5"  (1.651 m)   Wt 209 lb 12.8 oz (95.2 kg)   SpO2 98%   BMI 34.91 kg/m  General: no acute distress  Psych:  Alert and oriented, normal mood and affect Skin:  Warm, no rashes Neurologic:   Mental status is normal Commons side effects, risks, benefits, and alternatives for medications and treatment plan prescribed today were discussed, and the patient expressed understanding of the given instructions. Patient is instructed to call or message via MyChart if he/she has any questions or concerns regarding our treatment plan. No barriers to understanding were identified. We discussed Red Flag symptoms and signs in detail. Patient expressed understanding regarding what to do in case of urgent or emergency type symptoms.  Medication list was reconciled, printed and provided to the patient in AVS. Patient instructions and summary information was reviewed with the patient as documented in the AVS. This note was prepared with assistance of Dragon voice recognition  software. Occasional wrong-word or sound-a-like substitutions may have occurred due to the inherent limitation

## 2023-07-18 ENCOUNTER — Ambulatory Visit (INDEPENDENT_AMBULATORY_CARE_PROVIDER_SITE_OTHER): Payer: Medicare Other | Admitting: Bariatrics

## 2023-07-18 ENCOUNTER — Encounter: Payer: Self-pay | Admitting: Bariatrics

## 2023-07-18 VITALS — BP 121/68 | HR 69 | Temp 97.9°F | Ht 65.0 in | Wt 205.0 lb

## 2023-07-18 DIAGNOSIS — E559 Vitamin D deficiency, unspecified: Secondary | ICD-10-CM

## 2023-07-18 DIAGNOSIS — Z6834 Body mass index (BMI) 34.0-34.9, adult: Secondary | ICD-10-CM

## 2023-07-18 DIAGNOSIS — E1142 Type 2 diabetes mellitus with diabetic polyneuropathy: Secondary | ICD-10-CM

## 2023-07-18 DIAGNOSIS — E669 Obesity, unspecified: Secondary | ICD-10-CM | POA: Diagnosis not present

## 2023-07-18 DIAGNOSIS — Z7985 Long-term (current) use of injectable non-insulin antidiabetic drugs: Secondary | ICD-10-CM

## 2023-07-18 DIAGNOSIS — Z794 Long term (current) use of insulin: Secondary | ICD-10-CM

## 2023-07-18 DIAGNOSIS — E119 Type 2 diabetes mellitus without complications: Secondary | ICD-10-CM

## 2023-07-18 DIAGNOSIS — E66811 Obesity, class 1: Secondary | ICD-10-CM

## 2023-07-18 MED ORDER — ONDANSETRON HCL 4 MG PO TABS: 4.0000 mg | ORAL_TABLET | Freq: Three times a day (TID) | ORAL | 0 refills | Status: DC | PRN

## 2023-07-18 MED ORDER — VITAMIN D (ERGOCALCIFEROL) 1.25 MG (50000 UNIT) PO CAPS: 50000.0000 [IU] | ORAL_CAPSULE | ORAL | 0 refills | Status: DC

## 2023-07-18 MED ORDER — TRULICITY 1.5 MG/0.5ML ~~LOC~~ SOAJ: 1.5000 mg | SUBCUTANEOUS | 0 refills | Status: DC

## 2023-07-18 NOTE — Progress Notes (Signed)
 WEIGHT SUMMARY AND BIOMETRICS  Weight Lost Since Last Visit: 6lb  Weight Gained Since Last Visit: 0lb   Vitals Temp: 97.9 F (36.6 C) BP: 121/68 Pulse Rate: 69 SpO2: 98 %   Anthropometric Measurements Height: 5\' 5"  (1.651 m) Weight: 205 lb (93 kg) BMI (Calculated): 34.11 Weight at Last Visit: 211lb Weight Lost Since Last Visit: 6lb Weight Gained Since Last Visit: 0lb Starting Weight: 246lb Total Weight Loss (lbs): 41 lb (18.6 kg)   Body Composition  Body Fat %: 44.9 % Fat Mass (lbs): 92.2 lbs Muscle Mass (lbs): 107.4 lbs Total Body Water (lbs): 74.6 lbs Visceral Fat Rating : 14   Other Clinical Data Fasting: NO Labs: No Today's Visit #: 60 Starting Date: 03/12/19    OBESITY Lavene is here to discuss her progress with her obesity treatment plan along with follow-up of her obesity related diagnoses.    Nutrition Plan: the Category 3 plan - 95% adherence.  Current exercise: cardiovascular workout on exercise equipment  Interim History:  She is down 6 lbs since her last visit.  I startedTrulicity at her last visit and she has tolerated it well.  She occasionally has some nausea in the evening, but it is controlled with Zofran. Eating all of the food on the plan., Protein intake is as prescribed, Is skipping meals, Meeting protein goals., and Water intake is adequate.   Pharmacotherapy: Kennis is on Trulicity 1.5 mg SQ weekly Adverse side effects: Nausea Hunger is moderately controlled.  Cravings are moderately controlled.  Assessment/Plan:   Type II Diabetes HgbA1c is at goal. Last A1c was 6.2 CBGs: Fasting 90's to around 110.        Episodes of hypoglycemia: no Medication(s): Trulicity 1.5 mg SQ weekly  Lab Results  Component Value Date   HGBA1C 6.2 (A) 07/17/2023   HGBA1C 6.1 (A) 04/10/2023   HGBA1C 6.3 (H) 08/31/2022   Lab Results   Component Value Date   MICROALBUR 0.9 04/17/2023   LDLCALC 86 04/17/2023   CREATININE 0.83 06/05/2023   Lab Results  Component Value Date   GFR 59.84 (L) 04/17/2023   GFR 57.36 (L) 04/12/2022   GFR 60.62 06/09/2021    Plan: Continue and refill Trulicity 1.5 mg SQ weekly Continue all other medications.  Will keep all carbohydrates low both sweets and starches.  Will continue exercise regimen to 30 to 60 minutes on most days of the week.  Aim for 7 to 9 hours of sleep nightly.  Eat more low glycemic index foods. She will try to eat not too much or too little, and will avoid fatty foods. She will take her Zofran in the evening before 5-6 o'clock pm as that isusually when she gets slightly nausea had  Vitamin D Deficiency Vitamin D is at goal of 50.  Most recent vitamin D level was 67.2. She is on  prescription ergocalciferol  50,000 IU weekly. Lab Results  Component Value Date   VD25OH 67.2 07/06/2022   VD25OH 44.70 04/13/2021   VD25OH 85.0 09/07/2020    Plan: Refill prescription vitamin D 50,000 IU weekly.     Generalized Obesity: Current BMI BMI (Calculated): 34.11   Pharmacotherapy Plan Continue and refill  Trulicity 1.5 mg SQ weekly  Lakyla is currently in the action stage of change. As such, her goal is to continue with weight loss efforts.  She has agreed to the Category 3 plan.  Exercise goals: Older adults should determine their level of effort for physical activity relative to their level of fitness.   Behavioral modification strategies: increasing lean protein intake, decreasing simple carbohydrates , no meal skipping, meal planning , avoiding temptations, weigh protein portions, and mindful eating.  Thelma has agreed to follow-up with our clinic in 4 weeks.       Objective:   VITALS: Per patient if applicable, see vitals. GENERAL: Alert and in no acute distress. CARDIOPULMONARY: No increased WOB. Speaking in clear sentences.  PSYCH: Pleasant and  cooperative. Speech normal rate and rhythm. Affect is appropriate. Insight and judgement are appropriate. Attention is focused, linear, and appropriate.  NEURO: Oriented as arrived to appointment on time with no prompting.   Attestation Statements:    This was prepared with the assistance of Engineer, civil (consulting).  Occasional wrong-word or sound-a-like substitutions may have occurred due to the inherent limitations of voice recognition   Corinna Capra, DO

## 2023-07-26 ENCOUNTER — Telehealth: Payer: Self-pay | Admitting: Internal Medicine

## 2023-07-26 MED ORDER — DEXLANSOPRAZOLE 60 MG PO CPDR: 60.0000 mg | DELAYED_RELEASE_CAPSULE | Freq: Every day | ORAL | 0 refills | Status: DC

## 2023-07-26 NOTE — Telephone Encounter (Signed)
 PT is calling to get a refill on Dexilant. She scheduled an OV for 09/03/2023 to continue getting refills but she is completely and will need something to hold her over.Please advise.

## 2023-07-26 NOTE — Telephone Encounter (Signed)
Prescription sent to patient's pharmacy until scheduled appt in May. 

## 2023-08-07 ENCOUNTER — Ambulatory Visit (INDEPENDENT_AMBULATORY_CARE_PROVIDER_SITE_OTHER): Admitting: Podiatry

## 2023-08-07 DIAGNOSIS — G5791 Unspecified mononeuropathy of right lower limb: Secondary | ICD-10-CM | POA: Diagnosis not present

## 2023-08-07 MED ORDER — GABAPENTIN 600 MG PO TABS: ORAL_TABLET | ORAL | 3 refills | Status: AC

## 2023-08-07 NOTE — Progress Notes (Signed)
 She presents today for her right foot pain states that everything feels better while in taking the gabapentin but is not horrible.  Objective: Vital signs stable alert oriented x 3 no reproducible pain on palpation.  Pulses are palpable.  No open lesions or wounds.  Assessment: Pain in limb secondary to neuropathy.  Plan: She will take one 600 mg Neurontin/gabapentin in the morning and 2 at bedtime.  I will follow-up with her in 1 year.

## 2023-08-09 ENCOUNTER — Ambulatory Visit (INDEPENDENT_AMBULATORY_CARE_PROVIDER_SITE_OTHER): Payer: Medicare Other | Admitting: Internal Medicine

## 2023-08-09 ENCOUNTER — Encounter: Payer: Self-pay | Admitting: Internal Medicine

## 2023-08-09 ENCOUNTER — Other Ambulatory Visit: Payer: Self-pay | Admitting: Internal Medicine

## 2023-08-09 VITALS — BP 120/70 | HR 72 | Ht 65.0 in | Wt 213.8 lb

## 2023-08-09 DIAGNOSIS — E1165 Type 2 diabetes mellitus with hyperglycemia: Secondary | ICD-10-CM

## 2023-08-09 DIAGNOSIS — E782 Mixed hyperlipidemia: Secondary | ICD-10-CM

## 2023-08-09 DIAGNOSIS — Z794 Long term (current) use of insulin: Secondary | ICD-10-CM

## 2023-08-09 DIAGNOSIS — E1142 Type 2 diabetes mellitus with diabetic polyneuropathy: Secondary | ICD-10-CM

## 2023-08-09 DIAGNOSIS — E1169 Type 2 diabetes mellitus with other specified complication: Secondary | ICD-10-CM | POA: Diagnosis not present

## 2023-08-09 DIAGNOSIS — E118 Type 2 diabetes mellitus with unspecified complications: Secondary | ICD-10-CM | POA: Diagnosis not present

## 2023-08-09 DIAGNOSIS — E042 Nontoxic multinodular goiter: Secondary | ICD-10-CM

## 2023-08-09 MED ORDER — INSULIN LISPRO (1 UNIT DIAL) 100 UNIT/ML (KWIKPEN): 4.0000 [IU] | PEN_INJECTOR | Freq: Three times a day (TID) | SUBCUTANEOUS | 3 refills | Status: AC

## 2023-08-09 NOTE — Progress Notes (Signed)
 Patient ID: Jessica [name], female   DOB: Feb 28, 1951, 73 y.o.   MRN: [id number]  HPI: Jessica [name] is a 73 y.o.-year-old female, returning for follow-up for DM2, dx in 2000, insulin-dependent since 2005, uncontrolled, with complications (DR, PN, gastroparesis, autonomic neuropathy). Pt. previously saw Dr. Everardo All, but last visit with me 4 months ago.  Interim history: No increased urination, blurry vision, chest pain. She continues to go to the weight management clinic. She increased her Trulicity dose 2 mo ago >> nausea in the evening. She had a R TKR in 08/2022. Still has pain. She had a kidney stone since last OV.  DM2: Reviewed HbA1c: Lab Results  Component Value Date   HGBA1C 6.2 (A) 07/17/2023   HGBA1C 6.1 (A) 04/10/2023   HGBA1C 6.3 (H) 08/31/2022   HGBA1C 7.2 (A) 06/14/2022   HGBA1C 6.6 (A) 02/08/2022   HGBA1C 6.3 (A) 10/19/2021   HGBA1C 7.7 (A) 04/13/2021   HGBA1C 8.2 (A) 01/12/2021   HGBA1C 8.1 (A) 08/12/2020   HGBA1C 7.8 (A) 05/14/2020   Prev. on: - Humulin 70/30 40 >> 32 units after b'fast  >> before b'fastand 8 >> 10 >> 14 units before dinner  - Trulicity 0.75 mg weekly - adjusted by Weight Loss Specialist (on nausea med)- could not tolerate a higher dose She had nausea and stomach pain with Ozempic.  We changed to: - Lantus 30 >> 25 units at night - Fiasp  >> Humalog: 7-9 >> 5-7 units before b'fast 5-7 units before dinner - Trulicity 0.75 >> now 1.5 mg weekly  - increased 06/2023.  She has some nausea with it.  Pt checks her sugars 0-1x a day and they are: - am: 120-180 >> 150-160 >> 64-110, 121 >> 60-120 - 2h after b'fast: n/c - before lunch: n/c >> 74, 90-100 >> 60-113 - 2h after lunch: n/c >> 320 >> n/c - before dinner: 100-125 >> 110-120 >> 120 >> 113-120 - 2h after dinner: n/c >> <130 >> n/c >> 120-193 - bedtime: n/c >> 64 x1 >> 54 >> n/c - nighttime: n/c Lowest sugar was 77 >> 64 >> 54 >> 60; she has hypoglycemia awareness at 100.  Highest sugar  was 180 >> 320 >> 250 (carbs, starches) >> 193  Glucometer: One Touch ultra  She goes to the weight management clinic.  - no CKD, last BUN/creatinine:  Lab Results  Component Value Date   BUN 24 (H) 06/05/2023   BUN 20 04/17/2023   CREATININE 0.83 06/05/2023   CREATININE 0.95 04/17/2023   Lab Results  Component Value Date   MICRALBCREAT 0.5 04/17/2023   MICRALBCREAT 0.8 10/19/2021   MICRALBCREAT <14 03/11/2020   MICRALBCREAT <3 03/12/2019   MICRALBCREAT 0.5 10/25/2017   MICRALBCREAT 0.4 10/02/2016   MICRALBCREAT 2.4 11/03/2015   MICRALBCREAT 0.4 05/21/2013   MICRALBCREAT 3.6 04/04/2012   MICRALBCREAT 3.6 01/14/2009  She is on olmesartan 40 mg daily.  -+ HL; last set of lipids: Lab Results  Component Value Date   CHOL 158 04/17/2023   HDL 61.10 04/17/2023   LDLCALC 86 04/17/2023   LDLDIRECT 143.2 11/23/2006   TRIG 55.0 04/17/2023   CHOLHDL 3 04/17/2023  She is on Lipitor 40 mg daily.  - last eye exam was  05/2022: + DR reportedly. + DR in 2021. Coming up later this mo.  - + numbness and tingling in her feet.  On Neurontin 600 mg in a.m. and 1200 mg at night-refilled by podiatry.  Last foot exam 04/17/2023.  She  saw Dr. Al Corpus since last visit, but a diabetic foot exam was not performed at that time.  MNG:  Thyroid U/S (11/18/2019): Parenchymal Echotexture: Mildly heterogenous  Isthmus: 0.5 cm  Right lobe: 7.0 cm x 1.7 cm x 1.7 cm  Left lobe: 4.5 cm x 1.3 cm x 1.5 cm _________________________________________________________   Estimated total number of nodules >/= 1 cm: 3 _________________________________________________________   Nodule # 1:  Location: Isthmus; lower  Maximum size: 1.2 cm; Other 2 dimensions: 1.1 cm x 1.1 cm  Composition: cannot determine (2)  Echogenicity: isoechoic (1) Nodule does not meet criteria for surveillance or biopsy   _________________________________________________________   Nodule # 2:  Location: Right; Mid  Maximum size:  1.3 cm; Other 2 dimensions: 1.1 cm x 0.8 cm  Composition: cannot determine (2)  Echogenicity: isoechoic (1) Nodule does not meet criteria for surveillance or biopsy  _________________________________________________________   Nodule # 3:   Location: Right; Inferior  Maximum size: 1.9 cm; Other 2 dimensions: 1.6 cm x 1.2 cm  Composition: cannot determine (2)  Echogenicity: isoechoic (1) Nodule meets criteria for surveillance _________________________________________________________   No adenopathy   IMPRESSION: Multinodular thyroid.   Right inferior thyroid nodule (labeled 3, TR 3, 1.9 cm) meets criteria for surveillance, as designated by the newly established ACR TI-RADS criteria. Surveillance ultrasound study recommended to be performed annually up to 5 years.   She remembers having had a thyroid Bx by Dr. Everardo All >> reportedly benign.  Thyroid U/S (02/17/2022): Parenchymal Echotexture: Mildly heterogenous  Isthmus: 1.3 cm  Right lobe: 5.2 cm x 1.4 cm x 1.7 cm  Left lobe: 4.9 cm x 1.1 cm x 1.5 cm  _________________________________________________________   Estimated total number of nodules >/= 1 cm: 2 _________________________________________________________   Nodule labeled 1 in the isthmus, 1.2 cm, unchanged in size. Nodule remains TR 3 and meets criteria for surveillance.   Nodule labeled 2 right lower thyroid, slightly smaller now measuring 1.1 cm. Nodule has spongiform characteristics on the current ultrasound and does not meet criteria for further surveillance.   No adenopathy   IMPRESSION: Isthmic thyroid nodule (labeled 1, 1.2 cm, TR 3) meets criteria for surveillance, as designated by the newly established ACR TI-RADS criteria. Surveillance ultrasound study recommended to be performed annually up to 5 years, which would be July 2026.  Thyroid U/S (04/17/2023): Parenchymal Echotexture: Mildly heterogenous  Isthmus: 0.4 cm  Right lobe: 5.2 x 1.8 x 1.9 cm   Left lobe: 4.5 x 1.4 x 1.4 cm  _________________________________________________________   Estimated total number of nodules >/= 1 cm: 2  ________________________________________________________   Nodule # 1:  Prior biopsy: No  Location: Isthmus  Maximum size: 1.3 cm; Other 2 dimensions: 1.3 x 1.2 cm, previously, 1.2 cm  Composition: solid/almost completely solid (2)  Echogenicity: isoechoic (1) Given size (<1.4 cm) and appearance, this nodule does NOT meet TI-RADS criteria for biopsy or dedicated follow-up.  _________________________________________________________   1.1 cm vague nodularity remaining in the inferior right lobe appears stable since the prior study and does not meet criteria for follow-up. No enlarged or abnormal appearing lymph nodes are identified.   IMPRESSION: Stable 1.3 cm isoechoic nodule in the isthmus and 1.1 cm vague nodularity in the inferior right lobe. Neither nodule meets criteria for dedicated follow-up.  TSH levels have been normal: Lab Results  Component Value Date   TSH 1.56 04/17/2023   TSH 1.04 04/12/2022   TSH 0.91 04/13/2021   TSH 1.320 09/07/2020   TSH 2.15 10/16/2018  TSH 1.27 10/25/2017   TSH 1.46 10/02/2016   TSH 1.55 11/03/2015   TSH 0.74 05/21/2013   TSH 1.00 11/13/2012   TSH 1.680 04/04/2012   TSH 1.43 08/10/2010   TSH 1.10 04/01/2008   TSH 1.24 11/23/2006   Pt denies: - feeling nodules in neck - hoarseness - dysphagia - choking  ROS: + see HPI  Past Medical History:  Diagnosis Date   Anxiety    Arthritis    bilateral knees   Asthma    childhood, inhaler for wheezing PRN   Autonomic neuropathy    diabetic   Cataract    Celiac disease    Constipation    Depression    Diabetes mellitus    type II, Hemoglobin A1C 9.9 10/05/2011   Diabetic neuropathy (HCC)    Diverticulosis    Fatty liver    Gastroparesis    GERD (gastroesophageal reflux disease)    History of claustrophobia    with MRI tests    Hyperlipidemia    Hypertension    IBS (irritable bowel syndrome)    Kidney stones    Personal history of colonic polyps 03/2010   hyperplastic   PONV (postoperative nausea and vomiting)    Shingles 2021   Past Surgical History:  Procedure Laterality Date   APPENDECTOMY     CATARACT EXTRACTION Right 04/29/2018   CATARACT EXTRACTION Left 03/2018   COLONOSCOPY     DILATATION & CURRETTAGE/HYSTEROSCOPY WITH RESECTOCOPE N/A 03/24/2013   Procedure: DILATATION & CURETTAGE, HYSTEROSCOPY WITH RESECTION;  Surgeon: Levi Aland, MD;  Location: [facility];  Service: Gynecology;  Laterality: N/A;   HYSTEROSCOPY WITH D & C N/A 11/08/2015   Procedure: DILATATION AND CURETTAGE /HYSTEROSCOPY;  Surgeon: Levi Aland, MD;  Location: [facility];  Service: Gynecology;  Laterality: N/A;   left breast cyst removal  2000   LEFT HEART CATH AND CORONARY ANGIOGRAPHY N/A 10/31/2018   Procedure: LEFT HEART CATH AND CORONARY ANGIOGRAPHY;  Surgeon: Corky Crafts, MD;  Location: [facility] IN[facility]ASIVE CV [facility];  Service: Cardiovascular;  Laterality: N/A;   TOTAL KNEE ARTHROPLASTY Right 09/12/2022   Procedure: RIGHT TOTAL KNEE ARTHROPLASTY;  Surgeon: Kathryne Hitch, MD;  Location: [facility];  Service: Orthopedics;  Laterality: Right;   TUBAL LIGATION     Social History   Socioeconomic History   Marital status: Married    Spouse name: Devar Castrillo   Number of children: 2   Years of education: Not on file   Highest education level: Not on file  Occupational History   Occupation: Conservation officer, 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    Comment: parttime   Occupation: retired  Tobacco Use   Smoking status: Never   Smokeless tobacco: Never  Vaping Use   Vaping status: Never Used  Substance and Sexual Activity   Alcohol use: No   Drug use: No   Sexual activity: Yes    Birth control/protection: Post-menopausal, Surgical  Other Topics Concern   Not on file  Social History Narrative   Not on file   Social  Drivers of Health   Financial Resource Strain: Low Risk  (03/22/2023)   Overall Financial Resource Strain (CARDIA)    Difficulty of Paying Living Expenses: Not hard at all  Food Insecurity: No Food Insecurity (03/22/2023)   Hunger Vital Sign    Worried About Running Out of Food in the Last Year: Never true    Ran Out of Food in the Last Year: Never true  Transportation  Needs: No Transportation Needs (03/22/2023)   PRAPARE - Administrator, Civil Service (Medical): No    Lack of Transportation (Non-Medical): No  Physical Activity: Sufficiently Active (03/22/2023)   Exercise Vital Sign    Days of Exercise per Week: 5 days    Minutes of Exercise per Session: 30 min  Stress: Stress Concern Present (03/22/2023)   [employer] of Occupational Health - Occupational Stress Questionnaire    Feeling of Stress : Rather much  Social Connections: Socially Integrated (03/22/2023)   Social Connection and Isolation Panel [NHANES]    Frequency of Communication with Friends and Family: More than three times a week    Frequency of Social Gatherings with Friends and Family: Twice a week    Attends Religious Services: More than 4 times per year    Active Member of [employer] or Organizations: Yes    Attends Banker Meetings: 1 to 4 times per year    Marital Status: Married  Catering manager Violence: Not At Risk (03/22/2023)   Humiliation, Afraid, Rape, and Kick questionnaire    Fear of Current or Ex-Partner: No    Emotionally Abused: No    Physically Abused: No    Sexually Abused: No   Current Outpatient Medications on File Prior to Visit  Medication Sig Dispense Refill   albuterol (VENTOLIN HFA) 108 (90 Base) MCG/ACT inhaler TAKE 2 PUFFS BY MOUTH EVERY 6 HOURS AS NEEDED FOR WHEEZE OR SHORTNESS OF BREATH 6.7 each 3   amLODipine (NORVASC) 2.5 MG tablet Take 1 tablet (2.5 mg total) by mouth daily. 90 tablet 3   atorvastatin (LIPITOR) 80 MG tablet Take 1 tablet (80 mg total)  by mouth at bedtime. 90 tablet 3   BD PEN NEEDLE NANO 2ND GEN 32G X 4 MM MISC USE 3-4X A DAY 300 each 3   celecoxib (CELEBREX) 200 MG capsule TAKE 1 CAPSULE BY MOUTH 2 TIMES DAILY BETWEEN MEALS AS NEEDED. 60 capsule 1   dexlansoprazole (DEXILANT) 60 MG capsule Take 1 capsule (60 mg total) by mouth daily. 90 capsule 0   Dulaglutide (TRULICITY) 1.5 MG/0.5ML SOAJ Inject 1.5 mg into the skin once a week. 2 mL 0   furosemide (LASIX) 20 MG tablet TAKE 1 TABLET (20 MG TOTAL) BY MOUTH DAILY AS NEEDED FOR FLUID OR EDEMA. 90 tablet 3   gabapentin (NEURONTIN) 600 MG tablet Take one tablet in the morning and two tablets before bedtime 270 tablet 3   glucose blood (ONETOUCH ULTRA) test strip USE 2 TIMES DAILY. AND LANCETS 2/DAY 200 strip 3   HYDROcodone-acetaminophen (NORCO/VICODIN) 5-325 MG tablet Take 1 every 8 hours for pain is not relieved by Tylenol 1 20 tablet 0   insulin glargine (LANTUS SOLOSTAR) 100 UNIT/ML Solostar Pen Inject 25 Units into the skin at bedtime. 15 mL 3   insulin lispro (HUMALOG KWIKPEN) 100 UNIT/ML KwikPen INJECT 5-9 UNITS INTO THE SKIN 2 (TWO) TIMES DAILY BEFORE A MEAL. 30 mL 1   Insulin Syringe-Needle U-100 (BD INSULIN SYRINGE U/F) 31G X 5/16" 1 ML MISC USE UP TO 3 TIMES A DAY 100 each 5   ketoconazole (NIZORAL) 2 % cream APPLY TWICE A DAY FOR 2 WEEKS AS NEEDED FOR RASH 30 g 2   olmesartan (BENICAR) 40 MG tablet TAKE 1 TABLET BY MOUTH EVERY DAY 90 tablet 3   ondansetron (ZOFRAN) 4 MG tablet Take 1 tablet (4 mg total) by mouth every 8 (eight) hours as needed for nausea or vomiting. 30 tablet 0  ondansetron (ZOFRAN-ODT) 4 MG disintegrating tablet 4mg  ODT q4 hours prn nausea/vomit 12 tablet 0   polyethylene glycol powder ([employer] PURELAX) 17 GM/SCOOP powder Take 17 g by mouth at bedtime.     sucralfate (CARAFATE) 1 GM/10ML suspension Take 10 mLs (1 g total) by mouth daily as needed (nausea).     tamsulosin (FLOMAX) 0.4 MG CAPS capsule Take 1 capsule (0.4 mg total) by mouth daily. 10  capsule 0   valACYclovir (VALTREX) 500 MG tablet Take 1 tablet (500 mg total) by mouth daily. 90 tablet 3   Vitamin D, Ergocalciferol, (DRISDOL) 1.25 MG (50000 UNIT) CAPS capsule Take 1 capsule (50,000 Units total) by mouth every 7 (seven) days. 5 capsule 0   No current facility-administered medications on file prior to visit.   Allergies  Allergen Reactions   Codeine Nausea And Vomiting   Gluten Meal Other (See Comments)    No wheat or soy   Metronidazole Nausea And Vomiting   Nsaids Nausea And Vomiting and Other (See Comments)    Cannot tolerate large doses d/t Celiac disease    Soy Allergy (Obsolete)    Wheat    Family History  Problem Relation Age of Onset   Hypertension Mother    Colon cancer Sister        dx in her early 48s   Diabetes Sister    Endometrial cancer Sister    Hypertension Brother    Other Father        2 collapsed lungs   COPD Father    Diabetes Father    Heart attack Father    Heart failure Father    High blood pressure Father    Colon polyps Neg Hx    Esophageal cancer Neg Hx    Stomach cancer Neg Hx    PE: BP 120/70   Pulse 72   Ht 5\' 5"  (1.651 m)   Wt 213 lb 12.8 oz (97 kg)   SpO2 96%   BMI 35.58 kg/m  Wt Readings from Last 20 Encounters:  08/09/23 213 lb 12.8 oz (97 kg)  07/18/23 205 lb (93 kg)  07/17/23 209 lb 12.8 oz (95.2 kg)  07/03/23 211 lb (95.7 kg)  06/19/23 211 lb (95.7 kg)  06/05/23 209 lb 14.1 oz (95.2 kg)  05/17/23 210 lb (95.3 kg)  04/18/23 208 lb (94.3 kg)  04/17/23 231 lb 6.4 oz (105 kg)  04/10/23 216 lb 12.8 oz (98.3 kg)  03/27/23 214 lb (97.1 kg)  03/22/23 207 lb (93.9 kg)  03/13/23 207 lb (93.9 kg)  02/07/23 206 lb (93.4 kg)  01/03/23 205 lb (93 kg)  09/12/22 210 lb (95.3 kg)  09/05/22 209 lb (94.8 kg)  08/31/22 213 lb (96.6 kg)  08/17/22 215 lb (97.5 kg)  08/10/22 211 lb (95.7 kg)   Constitutional: overweight, in NAD, walks with a cane Eyes: no exophthalmos ENT: no thyromegaly, no cervical  lymphadenopathy Cardiovascular: RRR, No MRG Respiratory: CTA B Musculoskeletal: no deformities Skin:  no rashes Neurological: no tremor with outstretched hands  ASSESSMENT: 1. DM2, insulin-dependent, uncontrolled, with complications - gastroparesis - PN - Autonomic neuropathy - DR  2. HL  3.  Multinodular goiter  PLAN:  1. Patient with longstanding, previously uncontrolled, type 2 diabetes, with improved control lately, and an HbA1c of 6.1% at last visit.  She had another HbA1c obtained last month and this was slightly higher, but still excellent, at 6.2%. - At last visit, sugars appeared to be at goal, with occasional lows in  the 60s in the 150s overnight.  I advised her to decrease the Lantus dose -At today's visit, she still has some blood sugars at 60, so I advised her to decrease the Lantus dose even more.  Will continue to vary the dose of Humalog based on the size of her meals.  She increased her Trulicity dose since last visit,as advised by Dr. Manson Passey.  She does have some nausea with it but would like to continue with the same dose. - I suggested to:  Patient Instructions  Please reduce: - Lantus 20 units at night  Continue: - Humalog 5-7 units 2x a day 15 min before meals - Trulicity 1.5 mg weekly   Please return in 4-6 months with your sugar log.   - advised to check sugars at different times of the day - 4x a day, rotating check times - advised for yearly eye exams >> she is not UTD -she missed her last appointment due to the weather but has it scheduled within the next month - return to clinic in 4 months  2. HL - Her latest lipid panel from 04/2023 showed an LDL above our target of less than 70, but much decreased from baseline; other fractions at goal: Lab Results  Component Value Date   CHOL 158 04/17/2023   HDL 61.10 04/17/2023   LDLCALC 86 04/17/2023   LDLDIRECT 143.2 11/23/2006   TRIG 55.0 04/17/2023   CHOLHDL 3 04/17/2023  - She continues on Lipitor 40  mg daily without side effects  3. MNG - No neck compression symptoms or masses felt on palpation of her neck today - Latest TSH was normal Lab Results  Component Value Date   TSH 1.56 04/17/2023  -She had a thyroid ultrasound in 2021 which showed that the nodules remained unchanged.  The right lower pole nodule qualified for follow-up based on size.  Reportedly she had a benign biopsy-performed by Dr. Everardo All.  I was not able to find the records on this.  We repeated another ultrasound 02/17/2022 and the 1.2 cm isthmic nodule was stable and still met criteria for follow-up.  The right lower nodule was smaller.  After last visit, we checked another U/S (04/17/2023): Stable, small, nodules, not worrisome, no further imaging follow-up recommended. - Will continue to follow her clinically    Carlus Pavlov, MD PhD [facility]

## 2023-08-09 NOTE — Patient Instructions (Addendum)
 Please reduce: - Lantus 20 units at night  Continue: - Humalog 5-7 units 2x a day 15 min before meals - Trulicity 1.5 mg weekly   Please return in 4-6 months with your sugar log.

## 2023-08-15 ENCOUNTER — Encounter: Payer: Self-pay | Admitting: Bariatrics

## 2023-08-15 ENCOUNTER — Ambulatory Visit: Admitting: Bariatrics

## 2023-08-15 VITALS — BP 126/72 | HR 73 | Temp 97.8°F | Ht 65.0 in | Wt 206.0 lb

## 2023-08-15 DIAGNOSIS — E1142 Type 2 diabetes mellitus with diabetic polyneuropathy: Secondary | ICD-10-CM

## 2023-08-15 DIAGNOSIS — E669 Obesity, unspecified: Secondary | ICD-10-CM | POA: Diagnosis not present

## 2023-08-15 DIAGNOSIS — Z6834 Body mass index (BMI) 34.0-34.9, adult: Secondary | ICD-10-CM

## 2023-08-15 DIAGNOSIS — Z794 Long term (current) use of insulin: Secondary | ICD-10-CM | POA: Diagnosis not present

## 2023-08-15 DIAGNOSIS — E66811 Obesity, class 1: Secondary | ICD-10-CM

## 2023-08-15 DIAGNOSIS — E785 Hyperlipidemia, unspecified: Secondary | ICD-10-CM | POA: Diagnosis not present

## 2023-08-15 DIAGNOSIS — E6609 Other obesity due to excess calories: Secondary | ICD-10-CM

## 2023-08-15 DIAGNOSIS — E119 Type 2 diabetes mellitus without complications: Secondary | ICD-10-CM | POA: Diagnosis not present

## 2023-08-15 DIAGNOSIS — Z7985 Long-term (current) use of injectable non-insulin antidiabetic drugs: Secondary | ICD-10-CM

## 2023-08-15 DIAGNOSIS — E1169 Type 2 diabetes mellitus with other specified complication: Secondary | ICD-10-CM

## 2023-08-15 MED ORDER — ONDANSETRON HCL 4 MG PO TABS: 4.0000 mg | ORAL_TABLET | Freq: Three times a day (TID) | ORAL | 0 refills | Status: DC | PRN

## 2023-08-15 MED ORDER — TRULICITY 1.5 MG/0.5ML ~~LOC~~ SOAJ: 1.5000 mg | SUBCUTANEOUS | 0 refills | Status: DC

## 2023-08-15 NOTE — Progress Notes (Signed)
 WEIGHT SUMMARY AND BIOMETRICS  Weight Lost Since Last Visit: 0  Weight Gained Since Last Visit: 1lb   Vitals Temp: 97.8 F (36.6 C) BP: 126/72 Pulse Rate: 73 SpO2: 98 %   Anthropometric Measurements Height: 5\' 5"  (1.651 m) Weight: 206 lb (93.4 kg) BMI (Calculated): 34.28 Weight at Last Visit: 205lb Weight Lost Since Last Visit: 0 Weight Gained Since Last Visit: 1lb Starting Weight: 246lb Total Weight Loss (lbs): 40 lb (18.1 kg)   Body Composition  Body Fat %: 46.1 % Fat Mass (lbs): 95.4 lbs Muscle Mass (lbs): 105.8 lbs Total Body Water (lbs): 76.6 lbs Visceral Fat Rating : 14   Other Clinical Data Fasting: no Labs: no Today's Visit #: 50 Starting Date: 03/12/19    OBESITY Jessica [name] is here to discuss her progress with her obesity treatment plan along with follow-up of her obesity related diagnoses.    Nutrition Plan: the Category 3 plan - 90% adherence.  Current exercise:  Cubie and walking  Interim History:  She is up 1 lb since her last visit. I increased her Trulicity at the last visit to her current dose. She had been on the lowest dose for a long time as she could not tolerate the medication secondary to GI symptoms.  Eating all of the food on the plan., Protein intake is as prescribed, Is not skipping meals, Not journaling consistently., and Water intake is adequate.    Pharmacotherapy: Jessica [name] is on Trulicity 1.5 mg SQ weekly Adverse side effects: Nausea Hunger is moderately controlled.  Cravings are moderately controlled.  Assessment/Plan:   Type II Diabetes HgbA1c is at goal. Last A1c was 6.2 CBGs: Fasting 90 to 123       Episodes of hypoglycemia: yes - 60's in the am several times.  She has since seen her endocrinologist and they have lowered her insulin from 25 units to 20 units subsequently she has not had any episodes of  hypoglycemia less than 70.  Medication(s): Trulicity 1.5 mg SQ weekly  Lab Results  Component Value Date   HGBA1C 6.2 (A) 07/17/2023   HGBA1C 6.1 (A) 04/10/2023   HGBA1C 6.3 (H) 08/31/2022   Lab Results  Component Value Date   MICROALBUR 0.9 04/17/2023   LDLCALC 86 04/17/2023   CREATININE 0.83 06/05/2023   Lab Results  Component Value Date   GFR 59.84 (L) 04/17/2023   GFR 57.36 (L) 04/12/2022   GFR 60.62 06/09/2021    Plan: Continue and refill Trulicity 1.5 mg SQ weekly She will use her Zofran if she needs it for nausea.  She has a prescription on hand at home. Continue all other medications.  Will keep all carbohydrates low both sweets and starches.  Will continue exercise regimen to 30 to 60 minutes on most days of the week.  Aim for 7 to 9 hours of sleep nightly.  Eat more low  glycemic index foods.  She will keep her protein high at around 80 to 90 g.   Hyperlipidemia LDL is not at goal. Medication(s): Lipitor Cardiovascular risk factors: advanced age (older than 94 for men, 54 for women), diabetes mellitus, dyslipidemia, and obesity (BMI >= 30 kg/m2)  Lab Results  Component Value Date   CHOL 158 04/17/2023   HDL 61.10 04/17/2023   LDLCALC 86 04/17/2023   LDLDIRECT 143.2 11/23/2006   TRIG 55.0 04/17/2023   CHOLHDL 3 04/17/2023   Lab Results  Component Value Date   ALT 14 06/05/2023   AST 18 06/05/2023   ALKPHOS 104 06/05/2023   BILITOT 0.8 06/05/2023   The 10-year ASCVD risk score (Arnett DK, et al., 2019) is: 23.8%   Values used to calculate the score:     Age: 73 years     Sex: Female     Is Non-Hispanic African American: Yes     Diabetic: Yes     Tobacco smoker: No     Systolic Blood Pressure: 126 mmHg     Is BP treated: Yes     HDL Cholesterol: 61.1 mg/dL     Total Cholesterol: 158 mg/dL  Plan:  Continue statin.  Will avoid all trans fats.  Will read labels Will minimize saturated fats except the following: low fat meats in moderation,  diary, and limited dark chocolate.  Increase Omega 3 in foods, and consider an Omega 3 supplement.  She will increase her protein. Will decrease going out to eat. Will try some apple cider vinegar 1-1/2 teaspoons with her evening meal to help her glucose levels. Discussed alternate breakfast options to increase her protein.    Generalized Obesity: Current BMI BMI (Calculated): 34.28   Pharmacotherapy Plan Continue and refill  Trulicity 1.5 mg SQ weekly  Jessica [name] is currently in the action stage of change. As such, her goal is to continue with weight loss efforts.  She has agreed to the Category 3 plan.  Exercise goals: Older adults should follow the adult guidelines. When older adults cannot meet the adult guidelines, they should be as physically active as their abilities and conditions will allow.   Behavioral modification strategies: increasing lean protein intake, decreasing simple carbohydrates , no meal skipping, decrease eating out, meal planning , increase water intake, better snacking choices, and planning for success.  Jessica [name] has agreed to follow-up with our clinic in 4 weeks.    Objective:   VITALS: Per patient if applicable, see vitals. GENERAL: Alert and in no acute distress. CARDIOPULMONARY: No increased WOB. Speaking in clear sentences.  PSYCH: Pleasant and cooperative. Speech normal rate and rhythm. Affect is appropriate. Insight and judgement are appropriate. Attention is focused, linear, and appropriate.  NEURO: Oriented as arrived to appointment on time with no prompting.   Attestation Statements:   This was prepared with the assistance of Engineer, civil (consulting).  Occasional wrong-word or sound-a-like substitutions may have occurred due to the inherent limitations of voice recognition   Kirk Peper, DO

## 2023-08-15 NOTE — Progress Notes (Deleted)
 [facility] [address] [place] [address] Phone: [phone] Subjective:    I'm seeing this patient by the request  of:  Luevenia Saha, MD  CC:   [id number]  07/03/2023 Has been 2-1/2-year since previous injections that has improved previously.  Does have some radicular symptoms also noted from patient's degenerative disc disease of the neck that we need to continue to monitor.  Discussed icing regimen and home exercises, discussed which activities to do and which ones to avoid.  Increase activity slowly.  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  Follow-up again in 6 to 8 weeks     Updated 08/21/2023 Jessica [name] is a 73 y.o. female coming in with complaint of R shoulder pain      Past Medical History:  Diagnosis Date   Anxiety    Arthritis    bilateral knees   Asthma    childhood, inhaler for wheezing PRN   Autonomic neuropathy    diabetic   Cataract    Celiac disease    Constipation    Depression    Diabetes mellitus    type II, Hemoglobin A1C 9.9 10/05/2011   Diabetic neuropathy (HCC)    Diverticulosis    Fatty liver    Gastroparesis    GERD (gastroesophageal reflux disease)    History of claustrophobia    with MRI tests   Hyperlipidemia    Hypertension    IBS (irritable bowel syndrome)    Kidney stones    Personal history of colonic polyps 03/2010   hyperplastic   PONV (postoperative nausea and vomiting)    Shingles 2021   Past Surgical History:  Procedure Laterality Date   APPENDECTOMY     CATARACT EXTRACTION Right 04/29/2018   CATARACT EXTRACTION Left 03/2018   COLONOSCOPY     DILATATION & CURRETTAGE/HYSTEROSCOPY WITH RESECTOCOPE N/A 03/24/2013   Procedure: DILATATION & CURETTAGE, HYSTEROSCOPY WITH RESECTION;  Surgeon: Hamp Levine, MD;  Location: [facility];  Service: Gynecology;  Laterality: N/A;   HYSTEROSCOPY WITH D & C N/A 11/08/2015   Procedure: DILATATION AND CURETTAGE /HYSTEROSCOPY;  Surgeon: Hamp Levine, MD;  Location: [facility];  Service: Gynecology;  Laterality: N/A;   left breast cyst removal  2000   LEFT HEART CATH AND CORONARY ANGIOGRAPHY N/A 10/31/2018   Procedure: LEFT HEART CATH AND CORONARY ANGIOGRAPHY;  Surgeon: Lucendia Rusk, MD;  Location: [facility] INVASIVE CV LAB;  Service: Cardiovascular;  Laterality: N/A;   TOTAL KNEE ARTHROPLASTY Right 09/12/2022   Procedure: RIGHT TOTAL KNEE ARTHROPLASTY;  Surgeon: Arnie Lao, MD;  Location: [facility];  Service: Orthopedics;  Laterality: Right;   TUBAL LIGATION     Social History   Socioeconomic History   Marital status: Married    Spouse name: Devar Belen   Number of children: 2   Years of education: Not on file   Highest education level: Not on file  Occupational History   Occupation: Conservation officer, 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    Comment: parttime   Occupation: retired  Tobacco Use   Smoking status: Never   Smokeless tobacco: Never  Vaping Use   Vaping status: Never Used  Substance and Sexual Activity   Alcohol use: No   Drug use: No   Sexual activity: Yes    Birth control/protection: Post-menopausal, Surgical  Other Topics Concern   Not on file  Social History Narrative   Not on file   Social Drivers of  Health   Financial Resource Strain: Low Risk  (03/22/2023)   Overall Financial Resource Strain (CARDIA)    Difficulty of Paying Living Expenses: Not hard at all  Food Insecurity: No Food Insecurity (03/22/2023)   Hunger Vital Sign    Worried About Running Out of Food in the Last Year: Never true    Ran Out of Food in the Last Year: Never true  Transportation Needs: No Transportation Needs (03/22/2023)   PRAPARE - Administrator, Civil Service (Medical): No    Lack of Transportation (Non-Medical): No  Physical Activity: Sufficiently Active (03/22/2023)   Exercise Vital Sign    Days of Exercise per Week: 5 days    Minutes of Exercise per Session: 30 min  Stress: Stress Concern  Present (03/22/2023)   [employer] of Occupational Health - Occupational Stress Questionnaire    Feeling of Stress : Rather much  Social Connections: Socially Integrated (03/22/2023)   Social Connection and Isolation Panel [NHANES]    Frequency of Communication with Friends and Family: More than three times a week    Frequency of Social Gatherings with Friends and Family: Twice a week    Attends Religious Services: More than 4 times per year    Active Member of [employer] or Organizations: Yes    Attends Banker Meetings: 1 to 4 times per year    Marital Status: Married   Allergies  Allergen Reactions   Codeine Nausea And Vomiting   Gluten Meal Other (See Comments)    No wheat or soy   Metronidazole Nausea And Vomiting   Nsaids Nausea And Vomiting and Other (See Comments)    Cannot tolerate large doses d/t Celiac disease    Soy Allergy (Obsolete)    Wheat    Family History  Problem Relation Age of Onset   Hypertension Mother    Colon cancer Sister        dx in her early 45s   Diabetes Sister    Endometrial cancer Sister    Hypertension Brother    Other Father        2 collapsed lungs   COPD Father    Diabetes Father    Heart attack Father    Heart failure Father    High blood pressure Father    Colon polyps Neg Hx    Esophageal cancer Neg Hx    Stomach cancer Neg Hx     Current Outpatient Medications (Endocrine & Metabolic):    Dulaglutide (TRULICITY) 1.5 MG/0.5ML SOAJ, Inject 1.5 mg into the skin once a week.   insulin glargine (LANTUS SOLOSTAR) 100 UNIT/ML Solostar Pen, Inject 25 Units into the skin at bedtime.   insulin lispro (HUMALOG KWIKPEN) 100 UNIT/ML KwikPen, Inject 4-9 Units into the skin 3 (three) times daily before meals.  Current Outpatient Medications (Cardiovascular):    amLODipine (NORVASC) 2.5 MG tablet, Take 1 tablet (2.5 mg total) by mouth daily.   atorvastatin (LIPITOR) 80 MG tablet, Take 1 tablet (80 mg total) by mouth at  bedtime.   furosemide (LASIX) 20 MG tablet, TAKE 1 TABLET (20 MG TOTAL) BY MOUTH DAILY AS NEEDED FOR FLUID OR EDEMA.   olmesartan (BENICAR) 40 MG tablet, TAKE 1 TABLET BY MOUTH EVERY DAY  Current Outpatient Medications (Respiratory):    albuterol (VENTOLIN HFA) 108 (90 Base) MCG/ACT inhaler, TAKE 2 PUFFS BY MOUTH EVERY 6 HOURS AS NEEDED FOR WHEEZE OR SHORTNESS OF BREATH  Current Outpatient Medications (Analgesics):    celecoxib (CELEBREX) 200  MG capsule, TAKE 1 CAPSULE BY MOUTH 2 TIMES DAILY BETWEEN MEALS AS NEEDED.   HYDROcodone-acetaminophen (NORCO/VICODIN) 5-325 MG tablet, Take 1 every 8 hours for pain is not relieved by Tylenol 1 (Patient not taking: Reported on 08/09/2023)   Current Outpatient Medications (Other):    BD PEN NEEDLE NANO 2ND GEN 32G X 4 MM MISC, USE 3-4X A DAY   dexlansoprazole (DEXILANT) 60 MG capsule, Take 1 capsule (60 mg total) by mouth daily.   gabapentin (NEURONTIN) 600 MG tablet, Take one tablet in the morning and two tablets before bedtime   glucose blood (ONETOUCH ULTRA) test strip, USE 2 TIMES DAILY. AND LANCETS 2/DAY   Insulin Syringe-Needle U-100 (BD INSULIN SYRINGE U/F) 31G X 5/16" 1 ML MISC, USE UP TO 3 TIMES A DAY   ketoconazole (NIZORAL) 2 % cream, APPLY TWICE A DAY FOR 2 WEEKS AS NEEDED FOR RASH   ondansetron (ZOFRAN) 4 MG tablet, Take 1 tablet (4 mg total) by mouth every 8 (eight) hours as needed for nausea or vomiting.   ondansetron (ZOFRAN-ODT) 4 MG disintegrating tablet, 4mg  ODT q4 hours prn nausea/vomit   polyethylene glycol powder ([employer] PURELAX) 17 GM/SCOOP powder, Take 17 g by mouth at bedtime.   sucralfate (CARAFATE) 1 GM/10ML suspension, Take 10 mLs (1 g total) by mouth daily as needed (nausea).   tamsulosin (FLOMAX) 0.4 MG CAPS capsule, Take 1 capsule (0.4 mg total) by mouth daily.   valACYclovir (VALTREX) 500 MG tablet, Take 1 tablet (500 mg total) by mouth daily.   Vitamin D, Ergocalciferol, (DRISDOL) 1.25 MG (50000 UNIT) CAPS capsule, Take 1  capsule (50,000 Units total) by mouth every 7 (seven) days.   Reviewed prior external information including notes and imaging from  primary care provider As well as notes that were available from care everywhere and other healthcare systems.  Past medical history, social, surgical and family history all reviewed in electronic medical record.  No pertanent information unless stated regarding to the chief complaint.   Review of Systems:  No headache, visual changes, nausea, vomiting, diarrhea, constipation, dizziness, abdominal pain, skin rash, fevers, chills, night sweats, weight loss, swollen lymph nodes, body aches, joint swelling, chest pain, shortness of breath, mood changes. POSITIVE muscle aches  Objective  There were no vitals taken for this visit.   General: No apparent distress alert and oriented x3 mood and affect normal, dressed appropriately.  HEENT: Pupils equal, extraocular movements intact  Respiratory: Patient's speak in full sentences and does not appear short of breath  Cardiovascular: No lower extremity edema, non tender, no erythema      Impression and Recommendations:

## 2023-08-16 NOTE — Telephone Encounter (Signed)
 Contact pharmacy and they will fill the brand Humalog pen. They are getting ready for patient

## 2023-08-21 ENCOUNTER — Ambulatory Visit: Admitting: Family Medicine

## 2023-08-23 LAB — HM DIABETES EYE EXAM

## 2023-09-02 NOTE — Progress Notes (Unsigned)
 se     Brigitte Canard, PA-C [address] [place], [place]  [address] Phone: [phone]   Primary Care Physician: Luevenia Saha, MD  Primary Gastroenterologist:  Brigitte Canard, PA-C / Dr. Laurell Pond   Chief Complaint:  Medication Refill - Dexilant        HPI:   Jessica [name] is a 73 y.o. female  with a history of GERD, celiac disease maintained on Gluten Free Diet, family history of colon cancer, chronic constipation, diabetes, hypertension who is here for follow-up.  She last saw Dr. Bridgett Camps for follow-up  Currently takes Dexilant  for GERD and Miralax  for constipation.  07/2021 EGD: 2 cm hiatal hernia.  Normal esophagus, stomach, and duodenum.  No biopsies.  09/2019 colonoscopy: 2 small (4 mm, 5 mm) hyperplastic polyps removed.  Diverticulosis.  Excellent prep.  5-year repeat due to family history of colon cancer (due 09/2024).  Current Outpatient Medications  Medication Sig Dispense Refill   albuterol  (VENTOLIN  HFA) 108 (90 Base) MCG/ACT inhaler TAKE 2 PUFFS BY MOUTH EVERY 6 HOURS AS NEEDED FOR WHEEZE OR SHORTNESS OF BREATH 6.7 each 3   amLODipine  (NORVASC ) 2.5 MG tablet Take 1 tablet (2.5 mg total) by mouth daily. 90 tablet 3   atorvastatin  (LIPITOR) 80 MG tablet Take 1 tablet (80 mg total) by mouth at bedtime. 90 tablet 3   BD PEN NEEDLE NANO 2ND GEN 32G X 4 MM MISC USE 3-4X A DAY 300 each 3   celecoxib  (CELEBREX ) 200 MG capsule TAKE 1 CAPSULE BY MOUTH 2 TIMES DAILY BETWEEN MEALS AS NEEDED. 60 capsule 1   dexlansoprazole  (DEXILANT ) 60 MG capsule Take 1 capsule (60 mg total) by mouth daily. 90 capsule 0   Dulaglutide  (TRULICITY ) 1.5 MG/0.5ML SOAJ Inject 1.5 mg into the skin once a week. 2 mL 0   furosemide  (LASIX ) 20 MG tablet TAKE 1 TABLET (20 MG TOTAL) BY MOUTH DAILY AS NEEDED FOR FLUID OR EDEMA. 90 tablet 3   gabapentin  (NEURONTIN ) 600 MG tablet Take one tablet in the morning and two tablets before bedtime 270 tablet 3   glucose blood (ONETOUCH ULTRA) test strip USE 2 TIMES  DAILY. AND LANCETS 2/DAY 200 strip 3   HYDROcodone -acetaminophen  (NORCO/VICODIN) 5-325 MG tablet Take 1 every 8 hours for pain is not relieved by Tylenol  1 20 tablet 0   insulin  glargine (LANTUS  SOLOSTAR) 100 UNIT/ML Solostar Pen Inject 25 Units into the skin at bedtime. 15 mL 3   insulin  lispro (HUMALOG  KWIKPEN) 100 UNIT/ML KwikPen Inject 4-9 Units into the skin 3 (three) times daily before meals. 30 mL 3   Insulin  Syringe-Needle U-100 (BD INSULIN  SYRINGE U/F) 31G X 5/16" 1 ML MISC USE UP TO 3 TIMES A DAY 100 each 5   ketoconazole  (NIZORAL ) 2 % cream APPLY TWICE A DAY FOR 2 WEEKS AS NEEDED FOR RASH 30 g 2   olmesartan  (BENICAR ) 40 MG tablet TAKE 1 TABLET BY MOUTH EVERY DAY 90 tablet 3   ondansetron  (ZOFRAN ) 4 MG tablet Take 1 tablet (4 mg total) by mouth every 8 (eight) hours as needed for nausea or vomiting. 30 tablet 0   ondansetron  (ZOFRAN -ODT) 4 MG disintegrating tablet 4mg  ODT q4 hours prn nausea/vomit 12 tablet 0   polyethylene glycol powder ([employer]S PURELAX) 17 GM/SCOOP powder Take 17 g by mouth at bedtime.     sucralfate  (CARAFATE ) 1 GM/10ML suspension Take 10 mLs (1 g total) by mouth daily as needed (nausea).     tamsulosin  (FLOMAX ) 0.4 MG CAPS  capsule Take 1 capsule (0.4 mg total) by mouth daily. 10 capsule 0   valACYclovir  (VALTREX ) 500 MG tablet Take 1 tablet (500 mg total) by mouth daily. 90 tablet 3   Vitamin D , Ergocalciferol , (DRISDOL ) 1.25 MG (50000 UNIT) CAPS capsule Take 1 capsule (50,000 Units total) by mouth every 7 (seven) days. 5 capsule 0   No current facility-administered medications for this visit.    Allergies as of 09/03/2023 - Review Complete 08/15/2023  Allergen Reaction Noted   Codeine  Nausea And Vomiting 01/03/2022   Gluten meal Other (See Comments) 01/07/2016   Metronidazole Nausea And Vomiting 01/03/2022   Nsaids Nausea And Vomiting and Other (See Comments)    Soy allergy  (obsolete)  03/12/2019   Wheat  03/12/2019    Past Medical History:  Diagnosis Date    Anxiety    Arthritis    bilateral knees   Asthma    childhood, inhaler for wheezing PRN   Autonomic neuropathy    diabetic   Cataract    Celiac disease    Constipation    Depression    Diabetes mellitus    type II, Hemoglobin A1C 9.9 10/05/2011   Diabetic neuropathy (HCC)    Diverticulosis    Fatty liver    Gastroparesis    GERD (gastroesophageal reflux disease)    History of claustrophobia    with MRI tests   Hyperlipidemia    Hypertension    IBS (irritable bowel syndrome)    Kidney stones    Personal history of colonic polyps 03/2010   hyperplastic   PONV (postoperative nausea and vomiting)    Shingles 2021    Past Surgical History:  Procedure Laterality Date   APPENDECTOMY     CATARACT EXTRACTION Right 04/29/2018   CATARACT EXTRACTION Left 03/2018   COLONOSCOPY     DILATATION & CURRETTAGE/HYSTEROSCOPY WITH RESECTOCOPE N/A 03/24/2013   Procedure: DILATATION & CURETTAGE, HYSTEROSCOPY WITH RESECTION;  Surgeon: Hamp Levine, MD;  Location: [facility];  Service: Gynecology;  Laterality: N/A;   HYSTEROSCOPY WITH D & C N/A 11/08/2015   Procedure: DILATATION AND CURETTAGE /HYSTEROSCOPY;  Surgeon: Hamp Levine, MD;  Location: [facility];  Service: Gynecology;  Laterality: N/A;   left breast cyst removal  2000   LEFT HEART CATH AND CORONARY ANGIOGRAPHY N/A 10/31/2018   Procedure: LEFT HEART CATH AND CORONARY ANGIOGRAPHY;  Surgeon: Lucendia Rusk, MD;  [facility] INVASIVE CV LAB;  Service: Cardiovascular;  Laterality: N/A;   TOTAL KNEE ARTHROPLASTY Right 09/12/2022   Procedure: RIGHT TOTAL KNEE ARTHROPLASTY;  Surgeon: Arnie Lao, MD;  Location: [facility];  Service: Orthopedics;  Laterality: Right;   TUBAL LIGATION      Review of Systems:    All systems reviewed and negative except where noted in HPI.    Physical Exam:  There were no vitals taken for this visit. No LMP recorded. Patient is postmenopausal.  General: Well-nourished, well-developed in no acute  distress.  Lungs: Clear to auscultation bilaterally. Non-labored. Heart: Regular rate and rhythm, no murmurs rubs or gallops.  Abdomen: Bowel sounds are normal; Abdomen is Soft; No hepatosplenomegaly, masses or hernias;  No Abdominal Tenderness; No guarding or rebound tenderness. Neuro: Alert and oriented x 3.  Grossly intact.  Psych: Alert and cooperative, normal mood and affect.   Imaging Studies: No results found.  Labs: CBC    Component Value Date/Time   WBC 7.5 06/05/2023 1445   RBC 4.21 06/05/2023 1445   HGB 14.2 06/05/2023 1445  HGB 13.4 10/28/2018 1218   HCT 41.2 06/05/2023 1445   HCT 42.6 09/07/2020 1231   PLT 229 06/05/2023 1445   PLT 260 10/28/2018 1218   MCV 97.9 06/05/2023 1445   MCV 97 10/28/2018 1218   MCH 33.7 06/05/2023 1445   MCHC 34.5 06/05/2023 1445   RDW 11.8 06/05/2023 1445   RDW 12.0 10/28/2018 1218   LYMPHSABS 0.8 04/17/2023 1016   MONOABS 0.1 04/17/2023 1016   EOSABS 0.1 04/17/2023 1016   BASOSABS 0.0 04/17/2023 1016    CMP     Component Value Date/Time   NA 137 06/05/2023 1445   NA 141 03/11/2020 1038   K 3.8 06/05/2023 1445   CL 104 06/05/2023 1445   CO2 23 06/05/2023 1445   GLUCOSE 195 (H) 06/05/2023 1445   BUN 24 (H) 06/05/2023 1445   BUN 19 03/11/2020 1038   CREATININE 0.83 06/05/2023 1445   CREATININE 0.83 04/04/2012 0832   CALCIUM  9.4 06/05/2023 1445   PROT 7.3 06/05/2023 1445   PROT 6.9 03/11/2020 1038   ALBUMIN  4.1 06/05/2023 1445   ALBUMIN  4.0 03/11/2020 1038   AST 18 06/05/2023 1445   ALT 14 06/05/2023 1445   ALKPHOS 104 06/05/2023 1445   BILITOT 0.8 06/05/2023 1445   BILITOT 0.4 03/11/2020 1038   GFRNONAA >60 06/05/2023 1445   GFRAA 70 03/11/2020 1038       Assessment and Plan:   Jessica [name] is a 73 y.o. y/o female returns for follow-up of  1.  GERD and small hiatal hernia - Continue Dexilant  60 mg daily - GERD diet  2.  Celiac disease; under good control.  Last colonoscopy 07/2021 showed small hiatal  hernia, otherwise normal. - Continue gluten-free diet - Lab: TTG, vitamins?  3.  Chronic constipation - Continue MiraLAX  daily  4.  Family history of colon cancer - 5-year repeat colonoscopy will be due 09/2024.  Brigitte Canard, PA-C  Follow up ***

## 2023-09-03 ENCOUNTER — Ambulatory Visit (INDEPENDENT_AMBULATORY_CARE_PROVIDER_SITE_OTHER): Admitting: Physician Assistant

## 2023-09-03 ENCOUNTER — Encounter: Payer: Self-pay | Admitting: Physician Assistant

## 2023-09-03 ENCOUNTER — Other Ambulatory Visit: Payer: Self-pay | Admitting: Physician Assistant

## 2023-09-03 VITALS — BP 110/50 | HR 76 | Ht 64.5 in | Wt 212.2 lb

## 2023-09-03 DIAGNOSIS — K219 Gastro-esophageal reflux disease without esophagitis: Secondary | ICD-10-CM

## 2023-09-03 DIAGNOSIS — Z800 Family history of malignant neoplasm of digestive organs: Secondary | ICD-10-CM

## 2023-09-03 DIAGNOSIS — K900 Celiac disease: Secondary | ICD-10-CM | POA: Diagnosis not present

## 2023-09-03 DIAGNOSIS — K5904 Chronic idiopathic constipation: Secondary | ICD-10-CM

## 2023-09-03 DIAGNOSIS — K8681 Exocrine pancreatic insufficiency: Secondary | ICD-10-CM | POA: Diagnosis not present

## 2023-09-03 DIAGNOSIS — K449 Diaphragmatic hernia without obstruction or gangrene: Secondary | ICD-10-CM

## 2023-09-03 MED ORDER — DEXLANSOPRAZOLE 60 MG PO CPDR: 60.0000 mg | DELAYED_RELEASE_CAPSULE | Freq: Every day | ORAL | 3 refills | Status: AC

## 2023-09-03 MED ORDER — FAMOTIDINE 40 MG PO TABS: 40.0000 mg | ORAL_TABLET | Freq: Every day | ORAL | 3 refills | Status: DC

## 2023-09-03 NOTE — Patient Instructions (Signed)
 Please follow up sooner if symptoms increase or worsen  Due to recent changes in healthcare laws, you may see the results of your imaging and laboratory studies on MyChart before your provider has had a chance to review them.  We understand that in some cases there may be results that are confusing or concerning to you. Not all laboratory results come back in the same time frame and the provider may be waiting for multiple results in order to interpret others.  Please give us  48 hours in order for your provider to thoroughly review all the results before contacting the office for clarification of your results.   _______________________________________________________  If your blood pressure at your visit was 140/90 or greater, please contact your primary care physician to follow up on this.  _______________________________________________________  If you are age 32 or older, your body mass index should be between 23-30. Your Body mass index is 35.87 kg/m. If this is out of the aforementioned range listed, please consider follow up with your Primary Care Provider.  If you are age 29 or younger, your body mass index should be between 19-25. Your Body mass index is 35.87 kg/m. If this is out of the aformentioned range listed, please consider follow up with your Primary Care Provider.   ________________________________________________________  The [facility] providers would like to encourage you to use MYCHART to communicate with providers for non-urgent requests or questions.  Due to long hold times on the telephone, sending your provider a message by [facility] may be a faster and more efficient way to get a response.  Please allow 48 business hours for a response.  Please remember that this is for non-urgent requests.  _______________________________________________________ Thank you for trusting me with your gastrointestinal care!   Brigitte Canard, PA

## 2023-09-10 ENCOUNTER — Other Ambulatory Visit (HOSPITAL_COMMUNITY): Payer: Self-pay

## 2023-09-10 NOTE — Telephone Encounter (Signed)
Please initiate a PA. Thanks  

## 2023-09-10 NOTE — Telephone Encounter (Signed)
 No PA required. #30 per 30 days is $8.63 co-pay

## 2023-09-10 NOTE — Progress Notes (Signed)
 Addendum: Reviewed and agree with assessment and management plan. Asha Grumbine, Carie Caddy, MD

## 2023-09-12 ENCOUNTER — Ambulatory Visit (INDEPENDENT_AMBULATORY_CARE_PROVIDER_SITE_OTHER): Admitting: Bariatrics

## 2023-09-12 ENCOUNTER — Encounter: Payer: Self-pay | Admitting: Bariatrics

## 2023-09-12 VITALS — BP 134/74 | HR 66 | Temp 97.9°F | Ht 65.0 in | Wt 208.0 lb

## 2023-09-12 DIAGNOSIS — E1143 Type 2 diabetes mellitus with diabetic autonomic (poly)neuropathy: Secondary | ICD-10-CM | POA: Diagnosis not present

## 2023-09-12 DIAGNOSIS — Z7985 Long-term (current) use of injectable non-insulin antidiabetic drugs: Secondary | ICD-10-CM

## 2023-09-12 DIAGNOSIS — Z794 Long term (current) use of insulin: Secondary | ICD-10-CM

## 2023-09-12 DIAGNOSIS — K3184 Gastroparesis: Secondary | ICD-10-CM | POA: Diagnosis not present

## 2023-09-12 DIAGNOSIS — E669 Obesity, unspecified: Secondary | ICD-10-CM

## 2023-09-12 DIAGNOSIS — Z6834 Body mass index (BMI) 34.0-34.9, adult: Secondary | ICD-10-CM

## 2023-09-12 DIAGNOSIS — E6609 Other obesity due to excess calories: Secondary | ICD-10-CM

## 2023-09-12 DIAGNOSIS — E1142 Type 2 diabetes mellitus with diabetic polyneuropathy: Secondary | ICD-10-CM

## 2023-09-12 DIAGNOSIS — E66811 Obesity, class 1: Secondary | ICD-10-CM

## 2023-09-12 MED ORDER — TRULICITY 1.5 MG/0.5ML ~~LOC~~ SOAJ: 1.5000 mg | SUBCUTANEOUS | 0 refills | Status: DC

## 2023-09-12 MED ORDER — ONDANSETRON 4 MG PO TBDP: ORAL_TABLET | ORAL | 0 refills | Status: DC

## 2023-09-12 NOTE — Progress Notes (Signed)
 WEIGHT SUMMARY AND BIOMETRICS  Weight Lost Since Last Visit: 0  Weight Gained Since Last Visit: 2lb   Vitals Temp: 97.9 F (36.6 C) BP: 134/74 Pulse Rate: 66 SpO2: 98 %   Anthropometric Measurements Height: 5\' 5"  (1.651 m) Weight: 208 lb (94.3 kg) BMI (Calculated): 34.61 Weight at Last Visit: 206lb Weight Lost Since Last Visit: 0 Weight Gained Since Last Visit: 2lb Starting Weight: 246lb   Body Composition  Body Fat %: 46 % Fat Mass (lbs): 96 lbs Muscle Mass (lbs): 106.8 lbs Total Body Water (lbs): 77.8 lbs Visceral Fat Rating : 14   Other Clinical Data Fasting: no Labs: no Today's Visit #: 49 Starting Date: 03/12/19    OBESITY Jessica [name] is here to discuss her progress with her obesity treatment plan along with follow-up of her obesity related diagnoses.    Nutrition Plan: the Category 3 plan - 90% adherence.  Current exercise: Cubie  Interim History:  She is up 2 lbs since her last visit.  Eating all of the food on the plan., Protein intake is as prescribed, Is skipping meals, and Water intake is adequate.   Pharmacotherapy: Jessica [name] is on Trulicity  1.5 mg SQ weekly Adverse side effects: GERD,and some mild nausea Hunger is moderately controlled.  Cravings are moderately controlled.  Assessment/Plan:   Type II Diabetes HgbA1c is not at goal. Last A1c was 6.2 Episodes of hypoglycemia: no Medication(s): Trulicity  1.5 mg SQ weekly  Lab Results  Component Value Date   HGBA1C 6.2 (A) 07/17/2023   HGBA1C 6.1 (A) 04/10/2023   HGBA1C 6.3 (H) 08/31/2022   Lab Results  Component Value Date   MICROALBUR 0.9 04/17/2023   LDLCALC 86 04/17/2023   CREATININE 0.83 06/05/2023   Lab Results  Component Value Date   GFR 59.84 (L) 04/17/2023   GFR 57.36 (L) 04/12/2022   GFR 60.62 06/09/2021    Plan: Continue and refill Trulicity  1.5 mg SQ  weekly Continue all other medications.  Will keep all carbohydrates low both sweets and starches.  Will continue exercise regimen to 30 to 60 minutes on most days of the week.  Aim for 7 to 9 hours of sleep nightly.  Eat more low glycemic index foods.   Diabetic gastroparesis:   She is only having minimal symptoms at this time.  Plan:  She does have some diabetic gastroparesis and had to be on Trulicity  at the lowest dose for an extended period of time but she has been able to tolerate the Trulicity  at this time at the 1.5 dose and is doing well.  She will continue to use her Zofran  as needed and will also get some papaya enzymes to help with some of the bloating.   Generalized Obesity: Current BMI BMI (Calculated): 34.61   Pharmacotherapy Plan Continue and refill  Trulicity  1.5 mg SQ weekly  Jessica [name] is currently in the action  stage of change. As such, her goal is to continue with weight loss efforts.  She has agreed to the Category 3 plan ( increased protein and healthy snacks).   Exercise goals: Older adults should follow the adult guidelines. When older adults cannot meet the adult guidelines, they should be as physically active as their abilities and conditions will allow.   Behavioral modification strategies: increasing lean protein intake, decreasing simple carbohydrates , no meal skipping, meal planning , increase water intake, better snacking choices, planning for success, increasing vegetables, and keep healthy foods in the home.  Jessica [name] has agreed to follow-up with our clinic in 4 weeks.   Objective:   VITALS: Per patient if applicable, see vitals. GENERAL: Alert and in no acute distress. CARDIOPULMONARY: No increased WOB. Speaking in clear sentences.  PSYCH: Pleasant and cooperative. Speech normal rate and rhythm. Affect is appropriate. Insight and judgement are appropriate. Attention is focused, linear, and appropriate.  NEURO: Oriented as arrived to appointment on time  with no prompting.   Attestation Statements:   This was prepared with the assistance of Engineer, civil (consulting).  Occasional wrong-word or sound-a-like substitutions may have occurred due to the inherent limitations of voice recognition   Kirk Peper, DO

## 2023-09-18 ENCOUNTER — Other Ambulatory Visit (HOSPITAL_COMMUNITY): Payer: Self-pay

## 2023-09-18 NOTE — Telephone Encounter (Signed)
 No PA is required - co-pay is $8.63. It's possible by 'not available' they mean that they do not have it in stock

## 2023-09-26 ENCOUNTER — Other Ambulatory Visit: Payer: Self-pay | Admitting: Orthopaedic Surgery

## 2023-10-03 ENCOUNTER — Telehealth: Payer: Self-pay | Admitting: Physician Assistant

## 2023-10-03 DIAGNOSIS — K219 Gastro-esophageal reflux disease without esophagitis: Secondary | ICD-10-CM

## 2023-10-03 MED ORDER — CIMETIDINE 800 MG PO TABS: 800.0000 mg | ORAL_TABLET | Freq: Every day | ORAL | 2 refills | Status: DC

## 2023-10-03 NOTE — Telephone Encounter (Signed)
 PT is calling about the tagamet medication. She still has not received it. She stated that [facility] said they did not receive an order for that specific medication but for another one. Please advise.

## 2023-10-03 NOTE — Telephone Encounter (Signed)
 I have resent Tagamet rx to pharmacy. It appears the original order 'failed transmission to pharmacy" as per EPIC automated notes.

## 2023-10-09 ENCOUNTER — Encounter: Payer: Self-pay | Admitting: Bariatrics

## 2023-10-10 ENCOUNTER — Other Ambulatory Visit: Payer: Self-pay | Admitting: Bariatrics

## 2023-10-10 MED ORDER — TRULICITY 1.5 MG/0.5ML ~~LOC~~ SOAJ: 1.5000 mg | SUBCUTANEOUS | 0 refills | Status: DC

## 2023-10-10 NOTE — Telephone Encounter (Signed)
**Note De-identified  [place] Obfuscation** Please advise 

## 2023-10-10 NOTE — Telephone Encounter (Signed)
 Can you please advise? Patient was last seen 5/14  and follow-up on 6/18

## 2023-10-17 ENCOUNTER — Ambulatory Visit (INDEPENDENT_AMBULATORY_CARE_PROVIDER_SITE_OTHER): Admitting: Bariatrics

## 2023-10-17 ENCOUNTER — Ambulatory Visit: Admitting: Family Medicine

## 2023-10-17 ENCOUNTER — Encounter: Payer: Self-pay | Admitting: Bariatrics

## 2023-10-17 VITALS — BP 152/75 | HR 65 | Temp 97.9°F | Ht 65.0 in | Wt 208.0 lb

## 2023-10-17 DIAGNOSIS — E119 Type 2 diabetes mellitus without complications: Secondary | ICD-10-CM | POA: Diagnosis not present

## 2023-10-17 DIAGNOSIS — R110 Nausea: Secondary | ICD-10-CM

## 2023-10-17 DIAGNOSIS — Z7985 Long-term (current) use of injectable non-insulin antidiabetic drugs: Secondary | ICD-10-CM

## 2023-10-17 DIAGNOSIS — Z794 Long term (current) use of insulin: Secondary | ICD-10-CM | POA: Diagnosis not present

## 2023-10-17 DIAGNOSIS — E669 Obesity, unspecified: Secondary | ICD-10-CM | POA: Diagnosis not present

## 2023-10-17 DIAGNOSIS — Z6834 Body mass index (BMI) 34.0-34.9, adult: Secondary | ICD-10-CM

## 2023-10-17 DIAGNOSIS — E66811 Obesity, class 1: Secondary | ICD-10-CM

## 2023-10-17 MED ORDER — ONDANSETRON HCL 4 MG PO TABS: 4.0000 mg | ORAL_TABLET | Freq: Three times a day (TID) | ORAL | 0 refills | Status: DC | PRN

## 2023-10-17 NOTE — Progress Notes (Signed)
 WEIGHT SUMMARY AND BIOMETRICS  Weight Lost Since Last Visit: 0  Weight Gained Since Last Visit: 0   Vitals Temp: 97.9 F (36.6 C) BP: (!) 152/75 Pulse Rate: 65 SpO2: 99 %   Anthropometric Measurements Height: 5' 5 (1.651 m) Weight: 208 lb (94.3 kg) BMI (Calculated): 34.61 Weight at Last Visit: 208lb Weight Lost Since Last Visit: 0 Weight Gained Since Last Visit: 0 Starting Weight: 246lb Total Weight Loss (lbs): 38 lb (17.2 kg)   Body Composition  Body Fat %: 38.1 % Fat Mass (lbs): 79.4 lbs Muscle Mass (lbs): 122.4 lbs Total Body Water (lbs): 84.6 lbs Visceral Fat Rating : 13   Other Clinical Data Fasting: no Labs: no Today's Visit #: 25 Starting Date: 03/12/19    OBESITY Jessica [name] with her obesity treatment plan along with follow-up of her obesity related diagnoses.    Nutrition Plan: the Category 3 plan - 85% adherence.  Current exercise: Cubie  Interim History:  Her weight remains the same.  Eating all of the food on the plan., Protein intake is as prescribed, Is not skipping meals, Water intake is adequate., and Reports polyphagia   Pharmacotherapy: Jessica [name] is on Trulicity  1.5 mg SQ weekly Adverse side effects: None Hunger is moderately controlled.  Cravings are moderately controlled.  Assessment/Plan:    Type II Diabetes HgbA1c is at goal. Last A1c was 6.2 CBGs: Fasting 100 to 120, occasionally lower readings.       Episodes of hypoglycemia: no Medication(s): Trulicity  1.5 mg SQ weekly  Lab Results  Component Value Date   HGBA1C 6.2 (A) 07/17/2023   HGBA1C 6.1 (A) 04/10/2023   HGBA1C 6.3 (H) 08/31/2022   Lab Results  Component Value Date   MICROALBUR 0.9 04/17/2023   LDLCALC 86 04/17/2023   CREATININE 0.83 06/05/2023   Lab Results  Component Value Date   GFR 59.84 (L) 04/17/2023   GFR  57.36 (L) 04/12/2022   GFR 60.62 06/09/2021    Plan: Continue Trulicity  1.5 mg SQ weekly Continue all other medications.  Will keep all carbohydrates low both sweets and starches.  Will continue exercise regimen to 30 to 60 minutes on most days of the week.  Aim for 7 to 9 hours of sleep nightly.  Eat more low glycemic index foods.  Will continue to monitor her blood sugar once to twice daily or symptomatic.   Nausea:   She has some mild nausea secondary to the Trulicity  mainly on the second day.  She is trying to minimize fatty foods and not eat too much or eat too little.  Rx: Zofran  4 mg 1 every 8 hours as needed #30 with no refills    Generalized Obesity: Current BMI BMI (Calculated): 34.61   Pharmacotherapy Plan Continue  Trulicity  1.5 mg SQ weekly  Jessica [name] is currently in the action stage of change.  As such, her goal is to continue with weight loss efforts.  She has agreed to the Category 3 plan.  Exercise goals: All adults should avoid inactivity. Some physical activity is better than none, and adults who participate in any amount of physical activity gain some health benefits.  Behavioral modification strategies: increasing lean protein intake, no meal skipping, meal planning , increase water intake, better snacking choices, and planning for success.  Jessica [name] has agreed to follow-up with our clinic in 4 weeks.    Objective:   VITALS: Per patient if applicable, see vitals. GENERAL: Alert and in no acute distress. CARDIOPULMONARY: No increased WOB. Speaking in clear sentences.  PSYCH: Pleasant and cooperative. Speech normal rate and rhythm. Affect is appropriate. Insight and judgement are appropriate. Attention is focused, linear, and appropriate.  NEURO: Oriented as arrived to appointment on time with no prompting.   Attestation Statements:   This was prepared with the assistance of Engineer, civil (consulting).  Occasional wrong-word or sound-a-like substitutions may  have occurred due to the inherent limitations of voice recognition   Kirk Peper, DO

## 2023-10-24 ENCOUNTER — Encounter: Payer: Self-pay | Admitting: Family Medicine

## 2023-10-24 ENCOUNTER — Ambulatory Visit (INDEPENDENT_AMBULATORY_CARE_PROVIDER_SITE_OTHER): Admitting: Family Medicine

## 2023-10-24 VITALS — BP 136/78 | HR 66 | Temp 97.7°F | Ht 65.0 in | Wt 210.0 lb

## 2023-10-24 DIAGNOSIS — E538 Deficiency of other specified B group vitamins: Secondary | ICD-10-CM | POA: Diagnosis not present

## 2023-10-24 DIAGNOSIS — E1142 Type 2 diabetes mellitus with diabetic polyneuropathy: Secondary | ICD-10-CM | POA: Diagnosis not present

## 2023-10-24 DIAGNOSIS — Z794 Long term (current) use of insulin: Secondary | ICD-10-CM

## 2023-10-24 DIAGNOSIS — I152 Hypertension secondary to endocrine disorders: Secondary | ICD-10-CM

## 2023-10-24 DIAGNOSIS — E1159 Type 2 diabetes mellitus with other circulatory complications: Secondary | ICD-10-CM

## 2023-10-24 DIAGNOSIS — E1169 Type 2 diabetes mellitus with other specified complication: Secondary | ICD-10-CM

## 2023-10-24 DIAGNOSIS — Z7985 Long-term (current) use of injectable non-insulin antidiabetic drugs: Secondary | ICD-10-CM

## 2023-10-24 DIAGNOSIS — L304 Erythema intertrigo: Secondary | ICD-10-CM | POA: Diagnosis not present

## 2023-10-24 DIAGNOSIS — E782 Mixed hyperlipidemia: Secondary | ICD-10-CM

## 2023-10-24 LAB — POCT GLYCOSYLATED HEMOGLOBIN (HGB A1C): Hemoglobin A1C: 6.2 % — AB (ref 4.0–5.6)

## 2023-10-24 MED ORDER — AMLODIPINE BESYLATE 5 MG PO TABS: 5.0000 mg | ORAL_TABLET | Freq: Every day | ORAL | 3 refills | Status: AC

## 2023-10-24 MED ORDER — KETOCONAZOLE 2 % EX CREA: TOPICAL_CREAM | Freq: Two times a day (BID) | CUTANEOUS | 2 refills | Status: AC | PRN

## 2023-10-24 NOTE — Progress Notes (Signed)
 Subjective  CC:  Chief Complaint  Patient presents with   Diabetes    HPI: Jessica [name] is a 73 y.o. female who presents to the office today for follow up of diabetes and problems listed above in the chief complaint.  Discussed the use of AI scribe software for clinical note transcription with the patient, who gave verbal consent to proceed.  History of Present Illness Jessica [name] is a 73 year old female with hyperlipidemia and type 2 diabetes who presents for follow-up on htn, HLD and vitamin b12 deficiency  She is here to assess her response to an increased dosage of Lipitor from 40 mg to 80 mg for hyperlipidemia. She has not experienced any adverse effects from the medication, although she notes the pill is larger.  She is also on a B12 supplement, and her A1c is currently 6.2. Her endocrinologist has increased her Trulicity  dosage, which she tolerates better than Ozempic . Despite some days of not feeling well, she has been able to reduce her insulin  by five units at her last endocrinology visit.  She experiences significant fatigue, which she attributes to her knee replacement surgery a year ago on May 18. She feels 'wiped out' after activities and notes that her physical activity has not returned to normal. She uses a seated elliptical for 30 minutes daily and walks as much as possible to prevent knee stiffness.  She has developed a rash under her breasts, which she attributes to sweating and difficulty finding a 100% cotton bra. She has been using various powders and creams, including an old ketoconazole  cream, which has helped but the rash remains red. The rash has worsened over the past two weeks.  Her blood pressure is reportedly normal at home but tends to be high during office visits. She monitors it regularly and notes that it varies with stress levels.  Reviewed levels at the offices, borderline high.  Borderline today.  No chest pain shortness of breath.    Wt  Readings from Last 3 Encounters:  10/24/23 210 lb (95.3 kg)  10/17/23 208 lb (94.3 kg)  09/12/23 208 lb (94.3 kg)    BP Readings from Last 3 Encounters:  10/24/23 136/78  10/17/23 (!) 152/75  09/12/23 134/74    Assessment  1. Type 2 diabetes mellitus with diabetic polyneuropathy, with long-term current use of insulin  (HCC)   2. Vitamin B12 deficiency   3. Combined hyperlipidemia associated with type 2 diabetes mellitus (HCC)   4. Intertrigo   5. Peripheral sensory neuropathy due to type 2 diabetes mellitus (HCC)   6. Hypertension associated with diabetes (HCC)   7. Long-term (current) use of injectable non-insulin  antidiabetic drugs      Plan  Assessment and Plan Assessment & Plan Type 2 Diabetes Mellitus A1c at 6.2 indicates good control. Trulicity  tolerated better than Ozempic , reduced insulin  by 5 units. Occasional malaise possibly due to Trulicity . - Encouraged to continue exercise to improve stamina.  Hypertension Blood pressure occasionally elevated in clinic, normal at home. - Increase amlodipine  to 5 mg.  Continue other blood pressure medications  Hyperlipidemia On atorvastatin  80 mg without adverse effects. - Check blood work to assess cholesterol levels. Goal LDL less than 70.  Check LFTs on increased dose.  Tolerating well  Fatigue Increased fatigue post-knee surgery, possibly due to deconditioning and Trulicity . - Encouraged to continue exercise to improve stamina.  Intertrigo Rash under breasts likely due to sweating, mild and present for two weeks. Some improvement with  creams and powders. - Prescribe ketoconazole  cream for one week. - Advise use of powders like cornstarch or Gold Bond to keep area dry. - Suggest staying in front of a fan to reduce sweating.  Follow-up Next complete physical scheduled for December. - Schedule complete physical in December.  Health maintenance: Patient reports she is arranging her mammogram.  Requested  records  Follow up: 12 months for complete physical and follow-up chronic problems Orders Placed This Encounter  Procedures   Lipid panel   Comprehensive metabolic panel with GFR   Vitamin B12   POCT HgB A1C   Meds ordered this encounter  Medications   ketoconazole  (NIZORAL ) 2 % cream    Sig: Apply topically 2 (two) times daily as needed for up to 14 days for irritation.    Dispense:  30 g    Refill:  2   amLODipine  (NORVASC ) 5 MG tablet    Sig: Take 1 tablet (5 mg total) by mouth daily.    Dispense:  90 tablet    Refill:  3      Immunization History  Administered Date(s) Administered   Fluad Quad(high Dose 65+) 01/16/2019, 01/20/2020, 04/12/2022   Fluad Trivalent(High Dose 65+) 04/17/2023   Influenza,inj,Quad PF,6+ Mos 02/14/2018   Influenza-Unspecified 12/30/2020   PFIZER Comirnaty(Gray Top)Covid-19 Tri-Sucrose Vaccine 09/02/2020   PFIZER(Purple Top)SARS-COV-2 Vaccination 07/03/2019, 08/06/2019   Pneumococcal Conjugate-13 11/03/2015   Pneumococcal Polysaccharide-23 08/17/2010, 02/14/2018   Td 12/31/2002   Tdap 10/25/2017   Zoster Recombinant(Shingrix ) 11/05/2018, 01/28/2019    Diabetes Related Lab Review: Lab Results  Component Value Date   HGBA1C 6.2 (A) 10/24/2023   HGBA1C 6.2 (A) 07/17/2023   HGBA1C 6.1 (A) 04/10/2023    Lab Results  Component Value Date   MICROALBUR 0.9 04/17/2023   Lab Results  Component Value Date   CREATININE 0.83 06/05/2023   BUN 24 (H) 06/05/2023   NA 137 06/05/2023   K 3.8 06/05/2023   CL 104 06/05/2023   CO2 23 06/05/2023   Lab Results  Component Value Date   CHOL 158 04/17/2023   CHOL 145 04/12/2022   CHOL 135 04/13/2021   Lab Results  Component Value Date   HDL 61.10 04/17/2023   HDL 54.40 04/12/2022   HDL 59.70 04/13/2021   Lab Results  Component Value Date   LDLCALC 86 04/17/2023   LDLCALC 72 04/12/2022   LDLCALC 65 04/13/2021   Lab Results  Component Value Date   TRIG 55.0 04/17/2023   TRIG 93.0  04/12/2022   TRIG 50.0 04/13/2021   Lab Results  Component Value Date   CHOLHDL 3 04/17/2023   CHOLHDL 3 04/12/2022   CHOLHDL 2 04/13/2021   Lab Results  Component Value Date   LDLDIRECT 143.2 11/23/2006   The 10-year ASCVD risk score (Arnett DK, et al., 2019) is: 28%   Values used to calculate the score:     Age: 6 years     Clincally relevant sex: Female     Is Non-Hispanic African American: Yes     Diabetic: Yes     Tobacco smoker: No     Systolic Blood Pressure: 136 mmHg     Is BP treated: Yes     HDL Cholesterol: 61.1 mg/dL     Total Cholesterol: 158 mg/dL I have reviewed the PMH, Fam and Soc history. Patient Active Problem List   Diagnosis Date Noted   Morbid obesity (HCC) 04/13/2022    Priority: High    BMI 34  Peripheral sensory neuropathy due to type 2 diabetes mellitus (HCC) 04/05/2020    Priority: High   Celiac disease 02/14/2018    Priority: High   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) 02/14/2018    Priority: High   Class 2 obesity due to excess calories with body mass index (BMI) of 37.0 to 37.9 in adult 02/14/2018    Priority: High   Type 2 diabetes mellitus with complication, with long-term current use of insulin  (HCC) 09/23/2010    Priority: High   Combined hyperlipidemia associated with type 2 diabetes mellitus (HCC) 01/29/2009    Priority: High   Hypertension associated with diabetes (HCC) 10/22/2006    Priority: High    Qualifier: Diagnosis of  By: Wilhemina KRAFT, Lucy      Status post total right knee replacement 09/12/2022    Priority: Medium    Degenerative arthritis of left knee 08/07/2022    Priority: Medium     Toradol  injection given in August 07, 2022    Gastroesophageal reflux disease 04/18/2022    Priority: Medium    Family history of malignant neoplasm of endometrium 10/08/2018    Priority: Medium    Osteopenia 02/14/2018    Priority: Medium     dexa 2019    Mild intermittent asthma 02/14/2018     Priority: Medium    Degenerative cervical disc 07/27/2015    Priority: Medium    OSA (obstructive sleep apnea) 02/28/2011    Priority: Medium    Constipation, slow transit 09/23/2010    Priority: Medium    FH: colon cancer 09/23/2010    Priority: Medium    History of colonic polyps 09/23/2010    Priority: Medium     IMO SNOMED Dx Update Oct 2024    Diabetic gastroparesis (HCC) 02/27/2007    Priority: Medium    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 She has never used smokeless tobacco. She reports that she does not drink alcohol and does not use drugs.  Review of Systems: Ophthalmic: negative for eye pain, loss of vision or double vision Cardiovascular: negative for chest pain Respiratory: negative for SOB or persistent cough Gastrointestinal: negative for abdominal pain Genitourinary: negative for dysuria or gross hematuria MSK: negative for foot lesions Neurologic: negative for weakness or gait disturbance  Objective  Vitals: BP 136/78   Pulse 66   Temp 97.7 F (36.5 C)   Ht 5' 5 (1.651 m)   Wt 210 lb (95.3 kg)   SpO2 99%   BMI 34.95 kg/m  General: well appearing, no acute distress  Psych:  Alert and oriented, normal mood and affect HEENT:  Normocephalic, atraumatic, moist mucous membranes, supple neck  Cardiovascular:  Nl S1 and  S2, RRR without murmur, gallop or rub. no edema Respiratory:  Good  breath sounds bilaterally, CTAB with normal effort, no rales   Diabetic education: ongoing education regarding chronic disease management for diabetes was given today. We continue to reinforce the ABC's of diabetic management: A1c (<7 or 8 dependent upon patient), tight blood pressure control, and cholesterol management with goal LDL < 100 minimally. We discuss diet strategies, exercise recommendations, medication options and possible side effects. At each visit, we review recommended immunizations and preventive care recommendations for diabetics and stress that good diabetic control can prevent other problems. See below for this patient's data. Commons side effects, risks, benefits, and alternatives for medications and treatment plan prescribed today were discussed, and the patient expressed understanding of the given instructions. Patient is instructed to call or message via MyChart if he/she has any questions or concerns regarding our treatment plan. No barriers to understanding were identified. We discussed Red Flag symptoms and signs in detail. Patient expressed understanding regarding what to do in case of urgent or emergency type symptoms.  Medication list was reconciled, printed and provided to the patient in AVS. Patient instructions and summary information was reviewed with the patient as documented in the AVS. This note was prepared with assistance of Dragon voice recognition software. Occasional wrong-word or sound-a-like substitutions may have occurred due to the inherent limitations of voice recognition software

## 2023-10-24 NOTE — Patient Instructions (Signed)
 Please return in 6 months for your annual complete physical; please come fasting.  For follow up on chronic medical conditions   I will release your lab results to you on your MyChart account with further instructions. You may see the results before I do, but when I review them I will send you a message with my report or have my assistant call you if things need to be discussed. Please reply to my message with any questions. Thank you!   If you have any questions or concerns, please don't hesitate to send me a message via MyChart or call the office at [phone]. Thank you for visiting with us  today! It's our pleasure caring for you.    VISIT SUMMARY: Today, we reviewed your response to the increased dosage of Lipitor for hyperlipidemia, discussed your diabetes management, addressed your fatigue, and examined the rash under your breasts. We also talked about your blood pressure readings and made some adjustments to your medications.  YOUR PLAN: -TYPE 2 DIABETES MELLITUS: Type 2 diabetes is a condition where your body does not use insulin  properly, leading to high blood sugar levels. Your A1c is currently 6.2, which indicates good control. You are tolerating Trulicity  better than Ozempic  and have been able to reduce your insulin  by five units. Continue your exercise routine to help improve your stamina.  -HYPERTENSION: Hypertension is high blood pressure. Your blood pressure is occasionally elevated in the clinic but normal at home. We have increased your amlodipine  dosage to 5 mg to help manage this.  -HYPERLIPIDEMIA: Hyperlipidemia is having high levels of fats in your blood. You are currently on atorvastatin  80 mg without any adverse effects. We will check your blood work to assess your cholesterol levels.  -FATIGUE: Fatigue is a feeling of extreme tiredness. Your increased fatigue may be due to deconditioning after your knee surgery and possibly the Trulicity . Continue your exercise routine to  help improve your stamina.  -INTERTRIGO: Intertrigo is a rash that occurs in skin folds due to sweating and friction. The rash under your breasts has been present for two weeks and has shown some improvement with creams and powders. We will prescribe ketoconazole  cream for one week and advise you to use powders like cornstarch or Gold Bond to keep the area dry. Staying in front of a fan can also help reduce sweating.  INSTRUCTIONS: Please schedule your next complete physical in December.                      Contains text generated by [employer].                                 Contains text generated by [employer].

## 2023-10-25 LAB — COMPREHENSIVE METABOLIC PANEL WITH GFR
ALT: 11 U/L (ref 0–35)
AST: 16 U/L (ref 0–37)
Albumin: 4.3 g/dL (ref 3.5–5.2)
Alkaline Phosphatase: 93 U/L (ref 39–117)
BUN: 21 mg/dL (ref 6–23)
CO2: 27 meq/L (ref 19–32)
Calcium: 9.6 mg/dL (ref 8.4–10.5)
Chloride: 104 meq/L (ref 96–112)
Creatinine, Ser: 1.15 mg/dL (ref 0.40–1.20)
GFR: 47.41 mL/min — ABNORMAL LOW (ref >60.00)
Glucose, Bld: 89 mg/dL (ref 70–99)
Potassium: 4.2 meq/L (ref 3.5–5.1)
Sodium: 138 meq/L (ref 135–145)
Total Bilirubin: 0.6 mg/dL (ref 0.2–1.2)
Total Protein: 7.1 g/dL (ref 6.0–8.3)

## 2023-10-25 LAB — LIPID PANEL
Cholesterol: 132 mg/dL (ref 0–200)
HDL: 53.4 mg/dL (ref >39.00)
LDL Cholesterol: 69 mg/dL (ref 0–99)
NonHDL: 78.58
Total CHOL/HDL Ratio: 2
Triglycerides: 46 mg/dL (ref 0.0–149.0)
VLDL: 9.2 mg/dL (ref 0.0–40.0)

## 2023-10-25 LAB — VITAMIN B12: Vitamin B-12: >1500 pg/mL — ABNORMAL HIGH (ref 211–911)

## 2023-11-02 ENCOUNTER — Ambulatory Visit: Payer: Self-pay | Admitting: Family Medicine

## 2023-11-02 NOTE — Progress Notes (Signed)
 See mychart note Dear Ms. Hanner, It was good seeing you.  Your results look good overall. Your kidney function is slightly worse than it has been. We will follow this for you; diabetes and hypertension can cause kidney problems. You are on the protective medications. Avoid NSAIDs and we are working on keeping your blood pressure very well controlled.  No changes are needed at this time.  Sincerely, Dr. Jodie

## 2023-11-07 ENCOUNTER — Other Ambulatory Visit: Payer: Self-pay | Admitting: Bariatrics

## 2023-11-07 ENCOUNTER — Encounter: Payer: Self-pay | Admitting: Bariatrics

## 2023-11-07 MED ORDER — TRULICITY 1.5 MG/0.5ML ~~LOC~~ SOAJ: 1.5000 mg | SUBCUTANEOUS | 0 refills | Status: DC

## 2023-11-07 MED ORDER — ONDANSETRON HCL 4 MG PO TABS: 4.0000 mg | ORAL_TABLET | Freq: Three times a day (TID) | ORAL | 0 refills | Status: DC | PRN

## 2023-11-14 ENCOUNTER — Ambulatory Visit: Admitting: Bariatrics

## 2023-11-19 ENCOUNTER — Ambulatory Visit (INDEPENDENT_AMBULATORY_CARE_PROVIDER_SITE_OTHER): Admitting: Bariatrics

## 2023-11-19 ENCOUNTER — Encounter: Payer: Self-pay | Admitting: Bariatrics

## 2023-11-19 VITALS — BP 132/79 | HR 69 | Temp 97.9°F | Ht 65.0 in | Wt 210.0 lb

## 2023-11-19 DIAGNOSIS — Z6834 Body mass index (BMI) 34.0-34.9, adult: Secondary | ICD-10-CM

## 2023-11-19 DIAGNOSIS — E119 Type 2 diabetes mellitus without complications: Secondary | ICD-10-CM

## 2023-11-19 DIAGNOSIS — E1142 Type 2 diabetes mellitus with diabetic polyneuropathy: Secondary | ICD-10-CM

## 2023-11-19 DIAGNOSIS — Z794 Long term (current) use of insulin: Secondary | ICD-10-CM

## 2023-11-19 DIAGNOSIS — R110 Nausea: Secondary | ICD-10-CM | POA: Diagnosis not present

## 2023-11-19 DIAGNOSIS — Z7985 Long-term (current) use of injectable non-insulin antidiabetic drugs: Secondary | ICD-10-CM

## 2023-11-19 DIAGNOSIS — E669 Obesity, unspecified: Secondary | ICD-10-CM

## 2023-11-19 MED ORDER — ONDANSETRON HCL 4 MG PO TABS: 4.0000 mg | ORAL_TABLET | Freq: Three times a day (TID) | ORAL | 0 refills | Status: DC | PRN

## 2023-11-19 MED ORDER — TRULICITY 1.5 MG/0.5ML ~~LOC~~ SOAJ: 1.5000 mg | SUBCUTANEOUS | 0 refills | Status: DC

## 2023-11-19 NOTE — Progress Notes (Signed)
 WEIGHT SUMMARY AND BIOMETRICS  Weight Lost Since Last Visit: 0  Weight Gained Since Last Visit: 2lb   Vitals Temp: 97.9 F (36.6 C) BP: 132/79 Pulse Rate: 69 SpO2: 99 %   Anthropometric Measurements Height: 5' 5 (1.651 m) Weight: 210 lb (95.3 kg) BMI (Calculated): 34.95 Weight at Last Visit: 208lb Weight Lost Since Last Visit: 0 Weight Gained Since Last Visit: 2lb Starting Weight: 246lb Total Weight Loss (lbs): 36 lb (16.3 kg)   Body Composition  Body Fat %: 36.3 % Fat Mass (lbs): 76.4 lbs Muscle Mass (lbs): 127.4 lbs Total Body Water (lbs): 87.2 lbs Visceral Fat Rating : 12   Other Clinical Data Fasting: no Labs: no Today's Visit #: 71 Starting Date: 03/12/19    OBESITY Jessica [name] is here to discuss her progress with her obesity treatment plan along with follow-up of her obesity related diagnoses.    Nutrition Plan: the Category 3 plan - 80% adherence.  Current exercise: cubie and walking   Interim History:  She has been on vacation and she is up 2 pounds since her last visit. She got right back on her plan after getting back. She is up about 2 1/2 lbs.  Eating all of the food on the plan., Protein intake is as prescribed, Is not skipping meals, and Water intake is adequate.   Pharmacotherapy: Jessica [name] is on Trulicity  1.5 mg SQ weekly Adverse side effects: None Hunger is moderately controlled.  Cravings are moderately controlled.  Assessment/Plan:   Type II Diabetes HgbA1c is at goal. Last A1c was 6.2 CBGs: Not checking      Episodes of hypoglycemia: no Medication(s): Trulicity  1.5 mg SQ weekly  Lab Results  Component Value Date   HGBA1C 6.2 (A) 10/24/2023   HGBA1C 6.2 (A) 07/17/2023   HGBA1C 6.1 (A) 04/10/2023   Lab Results  Component Value Date   MICROALBUR 0.95 04/04/2012   LDLCALC 69 10/24/2023   CREATININE 1.15 10/24/2023    Lab Results  Component Value Date   GFR 47.41 (L) 10/24/2023   GFR 59.84 (L) 04/17/2023   GFR 57.36 (L) 04/12/2022    Plan: Continue and refill Trulicity  1.5 mg SQ weekly Continue all other medications.  Will keep all carbohydrates low both sweets and starches.  Will continue exercise regimen to 30 to 60 minutes on most days of the week.  Aim for 7 to 9 hours of sleep nightly.   Nausea:   She has some mild nausea secondary to Trulicity  but this has greatly improved.  We started her on the lowest dose of Trulicity  and have slowly brought her up to the second dose and she is currently doing well.  Plan:  Rx: Zofran  4 mg every 8 hours as needed #30 with no refills. Can use ginger for an antiemetic if needed.   Generalized Obesity: Current BMI BMI (Calculated): 34.95   Pharmacotherapy  Plan Continue and refill  Trulicity  1.5 mg SQ weekly  Jessica [name] is currently in the action stage of change. As such, her goal is to continue with weight loss efforts.  She has agreed to the Category 3 plan.  Exercise goals: Older adults should determine their level of effort for physical activity relative to their level of fitness.   Behavioral modification strategies: increasing lean protein intake, decreasing simple carbohydrates , no meal skipping, meal planning , better snacking choices, planning for success, increasing vegetables, increasing fiber rich foods, avoiding temptations, keep healthy foods in the home, weigh protein portions, measure portion sizes, work on smaller portions, and mindful eating.  Jessica [name] has agreed to follow-up with our clinic in 4 weeks.    Objective:   VITALS: Per patient if applicable, see vitals. GENERAL: Alert and in no acute distress. CARDIOPULMONARY: No increased WOB. Speaking in clear sentences.  PSYCH: Pleasant and cooperative. Speech normal rate and rhythm. Affect is appropriate. Insight and judgement are appropriate. Attention is focused, linear, and  appropriate.  NEURO: Oriented as arrived to appointment on time with no prompting.   Attestation Statements:   This was prepared with the assistance of Engineer, civil (consulting).  Occasional wrong-word or sound-a-like substitutions may have occurred due to the inherent limitations of voice recognition   Jessica Daring, DO

## 2023-11-22 ENCOUNTER — Other Ambulatory Visit: Payer: Self-pay | Admitting: Orthopaedic Surgery

## 2023-11-25 ENCOUNTER — Other Ambulatory Visit: Payer: Self-pay | Admitting: Family Medicine

## 2023-12-19 ENCOUNTER — Encounter: Payer: Self-pay | Admitting: Bariatrics

## 2023-12-19 ENCOUNTER — Ambulatory Visit (INDEPENDENT_AMBULATORY_CARE_PROVIDER_SITE_OTHER): Admitting: Bariatrics

## 2023-12-19 VITALS — BP 134/74 | HR 65 | Temp 97.9°F | Ht 65.0 in | Wt 207.0 lb

## 2023-12-19 DIAGNOSIS — Z6834 Body mass index (BMI) 34.0-34.9, adult: Secondary | ICD-10-CM | POA: Diagnosis not present

## 2023-12-19 DIAGNOSIS — I10 Essential (primary) hypertension: Secondary | ICD-10-CM

## 2023-12-19 DIAGNOSIS — E119 Type 2 diabetes mellitus without complications: Secondary | ICD-10-CM

## 2023-12-19 DIAGNOSIS — E669 Obesity, unspecified: Secondary | ICD-10-CM

## 2023-12-19 DIAGNOSIS — E1142 Type 2 diabetes mellitus with diabetic polyneuropathy: Secondary | ICD-10-CM

## 2023-12-19 DIAGNOSIS — Z794 Long term (current) use of insulin: Secondary | ICD-10-CM

## 2023-12-19 DIAGNOSIS — Z7985 Long-term (current) use of injectable non-insulin antidiabetic drugs: Secondary | ICD-10-CM

## 2023-12-19 MED ORDER — TRULICITY 1.5 MG/0.5ML ~~LOC~~ SOAJ: 1.5000 mg | SUBCUTANEOUS | 0 refills | Status: DC

## 2023-12-19 MED ORDER — ONDANSETRON HCL 4 MG PO TABS: 4.0000 mg | ORAL_TABLET | Freq: Three times a day (TID) | ORAL | 0 refills | Status: DC | PRN

## 2023-12-19 NOTE — Progress Notes (Signed)
 WEIGHT SUMMARY AND BIOMETRICS  Weight Lost Since Last Visit: 3lb  Weight Gained Since Last Visit: 0   Vitals Temp: 97.9 F (36.6 C) BP: 134/74 Pulse Rate: 65 SpO2: 100 %   Anthropometric Measurements Height: 5' 5 (1.651 m) Weight: 207 lb (93.9 kg) BMI (Calculated): 34.45 Weight at Last Visit: 210lb Weight Lost Since Last Visit: 3lb Weight Gained Since Last Visit: 0 Starting Weight: 246lb Total Weight Loss (lbs): 33 lb (15 kg)   Body Composition  Body Fat %: 36.2 % Fat Mass (lbs): 75 lbs Muscle Mass (lbs): 125.8 lbs Total Body Water (lbs): 86.2 lbs Visceral Fat Rating : 12   Other Clinical Data Fasting: no Labs: no Today's Visit #: 66 Starting Date: 03/12/19    OBESITY Jessica [name] is here to discuss Jessica [name] progress with Jessica [name] obesity treatment plan along with follow-up of Jessica [name] obesity related diagnoses.    Nutrition Plan: the Category 3 plan - 85% adherence.  Current exercise: walking and stationary bike  Interim History:  Jessica [name] is down 3 lbs since Jessica [name] last visit. Jessica [name] has been stressed secondary to health issues in the family.  Eating all of the food on the plan., Protein intake is less than prescribed., Is not skipping meals, Water intake is adequate., and Reports excessive cravings. Due stress.    Pharmacotherapy: Jessica [name] is on Trulicity  1.5 mg SQ weekly Adverse side effects: None Hunger is moderately controlled.  Cravings are moderately controlled.  Assessment/Plan:   Type II Diabetes HgbA1c is at goal. Last A1c was 6.2 Episodes of hypoglycemia: no Medication(s): Trulicity  1.5 mg SQ weekly  Lab Results  Component Value Date   HGBA1C 6.2 (A) 10/24/2023   HGBA1C 6.2 (A) 07/17/2023   HGBA1C 6.1 (A) 04/10/2023   Lab Results  Component Value Date   MICROALBUR 0.95 04/04/2012   LDLCALC 69 10/24/2023   CREATININE 1.15 10/24/2023   Lab Results   Component Value Date   GFR 47.41 (L) 10/24/2023   GFR 59.84 (L) 04/17/2023   GFR 57.36 (L) 04/12/2022    Plan: Continue and refill Trulicity  1.5 mg SQ weekly Rx: Zofran  4 mg 1 every 8 hours as needed.  Will keep all carbohydrates low both sweets and starches.  Will continue exercise regimen to 30 to 60 minutes on most days of the week.  Aim for 7 to 9 hours of sleep nightly.  Eat more low glycemic index foods.   Hypertension Hypertension stable.  Medication(s): Amlodipine  5 mg 1 daily  and Benicar  40 mg once daily   BP Readings from Last 3 Encounters:  12/19/23 134/74  11/19/23 132/79  10/24/23 136/78   Lab Results  Component Value Date   CREATININE 1.15 10/24/2023   CREATININE 0.83 06/05/2023   CREATININE 0.95 04/17/2023   Lab Results  Component Value Date   GFR 47.41 (L) 10/24/2023   GFR 59.84 (L) 04/17/2023  GFR 57.36 (L) 04/12/2022    Plan: Continue all antihypertensives at current dosages. No added salt. Will keep sodium content to 1,500 mg or less per day.   Will try to do more walking and some light weights.  Will continue meal planning.     Generalized Obesity: Current BMI BMI (Calculated): 34.45   Pharmacotherapy Plan Continue and refill  Trulicity  1.5 mg SQ weekly  Jessica [name] is currently in the action stage of change. As such, Jessica [name] goal is to continue with weight loss efforts.  Jessica [name] has agreed to the Category 3 plan.  Exercise goals: Older adults should do exercises that maintain or improve balance if they are at risk of falling.   Behavioral modification strategies: increasing lean protein intake, decreasing simple carbohydrates , no meal skipping, meal planning , increase water intake, better snacking choices, planning for success, avoiding temptations, and keep healthy foods in the home.  Jessica [name] has agreed to follow-up with our clinic in 4 weeks.     Objective:   VITALS: Per patient if applicable, see vitals. GENERAL: Alert and in no acute  distress. CARDIOPULMONARY: No increased WOB. Speaking in clear sentences.  PSYCH: Pleasant and cooperative. Speech normal rate and rhythm. Affect is appropriate. Insight and judgement are appropriate. Attention is focused, linear, and appropriate.  NEURO: Oriented as arrived to appointment on time with no prompting.   Attestation Statements:   This was prepared with the assistance of Engineer, civil (consulting).  Occasional wrong-word or sound-a-like substitutions may have occurred due to the inherent limitations of voice recognition   Clayborne Daring, DO

## 2023-12-30 ENCOUNTER — Other Ambulatory Visit: Payer: Self-pay | Admitting: Physician Assistant

## 2023-12-30 DIAGNOSIS — K219 Gastro-esophageal reflux disease without esophagitis: Secondary | ICD-10-CM

## 2024-01-22 ENCOUNTER — Other Ambulatory Visit: Payer: Self-pay | Admitting: Orthopaedic Surgery

## 2024-01-23 ENCOUNTER — Ambulatory Visit (INDEPENDENT_AMBULATORY_CARE_PROVIDER_SITE_OTHER): Admitting: Bariatrics

## 2024-01-23 ENCOUNTER — Encounter: Payer: Self-pay | Admitting: Bariatrics

## 2024-01-23 VITALS — BP 155/80 | HR 75 | Temp 97.7°F | Ht 65.0 in | Wt 208.0 lb

## 2024-01-23 DIAGNOSIS — Z794 Long term (current) use of insulin: Secondary | ICD-10-CM | POA: Diagnosis not present

## 2024-01-23 DIAGNOSIS — E1142 Type 2 diabetes mellitus with diabetic polyneuropathy: Secondary | ICD-10-CM

## 2024-01-23 DIAGNOSIS — Z6834 Body mass index (BMI) 34.0-34.9, adult: Secondary | ICD-10-CM

## 2024-01-23 DIAGNOSIS — E6609 Other obesity due to excess calories: Secondary | ICD-10-CM

## 2024-01-23 DIAGNOSIS — E119 Type 2 diabetes mellitus without complications: Secondary | ICD-10-CM | POA: Diagnosis not present

## 2024-01-23 DIAGNOSIS — E559 Vitamin D deficiency, unspecified: Secondary | ICD-10-CM

## 2024-01-23 DIAGNOSIS — E669 Obesity, unspecified: Secondary | ICD-10-CM | POA: Diagnosis not present

## 2024-01-23 DIAGNOSIS — Z7985 Long-term (current) use of injectable non-insulin antidiabetic drugs: Secondary | ICD-10-CM

## 2024-01-23 MED ORDER — TRULICITY 1.5 MG/0.5ML ~~LOC~~ SOAJ: 1.5000 mg | SUBCUTANEOUS | 0 refills | Status: DC

## 2024-01-23 MED ORDER — VITAMIN D (ERGOCALCIFEROL) 1.25 MG (50000 UNIT) PO CAPS: 50000.0000 [IU] | ORAL_CAPSULE | ORAL | 0 refills | Status: DC

## 2024-01-23 MED ORDER — ONDANSETRON HCL 4 MG PO TABS: 4.0000 mg | ORAL_TABLET | Freq: Three times a day (TID) | ORAL | 0 refills | Status: DC | PRN

## 2024-01-23 NOTE — Progress Notes (Signed)
 WEIGHT SUMMARY AND BIOMETRICS  Weight Lost Since Last Visit: 0  Weight Gained Since Last Visit: 1lb   Vitals Temp: 97.7 F (36.5 C) BP: (!) 155/80 Pulse Rate: 75 SpO2: 98 %   Anthropometric Measurements Height: 5' 5 (1.651 m) Weight: 208 lb (94.3 kg) BMI (Calculated): 34.61 Weight at Last Visit: 207lb Weight Lost Since Last Visit: 0 Weight Gained Since Last Visit: 1lb Starting Weight: 246lb Total Weight Loss (lbs): 32 lb (14.5 kg)   Body Composition  Body Fat %: 38.8 % Fat Mass (lbs): 80.8 lbs Muscle Mass (lbs): 120.8 lbs Total Body Water (lbs): 85 lbs Visceral Fat Rating : 13   Other Clinical Data Fasting: no Labs: no Today's Visit #: 38 Starting Date: 03/12/19    OBESITY Jessica [name] is here to discuss her progress with her obesity treatment plan along with follow-up of her obesity related diagnoses.    Nutrition Plan: the Category 3 plan - 90% adherence.  Current exercise: walking and cubie  Interim History:  She is up 1 lb since her last visit.  Eating all of the food on the plan., Protein intake is as prescribed, and Water intake is inadequate.   Pharmacotherapy: Jessica [name] is on Trulicity  1.5 mg SQ weekly Adverse side effects: Nausea, occasionally  Hunger is moderately controlled.  Cravings are moderately controlled.  Assessment/Plan:   Type II Diabetes HgbA1c is at goal. Last A1c was 6.2 CBGs: Not checking      Episodes of hypoglycemia: no Medication(s): Trulicity  1.5 mg SQ weekly  Lab Results  Component Value Date   HGBA1C 6.2 (A) 10/24/2023   HGBA1C 6.2 (A) 07/17/2023   HGBA1C 6.1 (A) 04/10/2023   Lab Results  Component Value Date   MICROALBUR 0.95 04/04/2012   LDLCALC 69 10/24/2023   CREATININE 1.15 10/24/2023   Lab Results  Component Value Date   GFR 47.41 (L) 10/24/2023   GFR 59.84 (L) 04/17/2023   GFR 57.36 (L)  04/12/2022    Plan: Continue and refill Trulicity  1.5 mg SQ weekly Continue all other medications.  Will keep all carbohydrates low both sweets and starches.  Will continue exercise regimen to 30 to 60 minutes on most days of the week.  Aim for 7 to 9 hours of sleep nightly.  Eat more low glycemic index foods.  Will increase her water back to 64 ounces. Will do more meal planning.   Vitamin D  Deficiency Vitamin D  is at goal of 50.  Most recent vitamin D  level was 67.2. She is on  prescription ergocalciferol  50,000 IU weekly. Lab Results  Component Value Date   VD25OH 67.2 07/06/2022   VD25OH 44.70 04/13/2021   VD25OH 85.0 09/07/2020    Plan: Refill prescription vitamin D  50,000 IU weekly.    Generalized Obesity: Current BMI BMI (Calculated): 34.61   Pharmacotherapy Plan Continue and refill  Trulicity  1.5 mg SQ weekly  Jessica [name] is currently in the action stage of change. As such, her goal is to continue with weight loss efforts.  She has agreed to the Category 3 plan.  Exercise goals: Older adults should determine their level of effort for physical activity relative to their level of fitness.   Behavioral modification strategies: increasing lean protein intake, no meal skipping, meal planning , better snacking choices, planning for success, increasing vegetables, avoiding temptations, keep healthy foods in the home, and mindful eating.  Jessica [name] has agreed to follow-up with our clinic in 4 weeks.    Objective:   VITALS: Per patient if applicable, see vitals. GENERAL: Alert and in no acute distress. CARDIOPULMONARY: No increased WOB. Speaking in clear sentences.  PSYCH: Pleasant and cooperative. Speech normal rate and rhythm. Affect is appropriate. Insight and judgement are appropriate. Attention is focused, linear, and appropriate.  NEURO: Oriented as arrived to appointment on time with no prompting.   Attestation Statements:   This was prepared with the assistance of  Engineer, civil (consulting).  Occasional wrong-word or sound-a-like substitutions may have occurred due to the inherent limitations of voice recognition   Clayborne Daring, DO

## 2024-01-24 ENCOUNTER — Encounter: Payer: Self-pay | Admitting: Internal Medicine

## 2024-01-24 ENCOUNTER — Ambulatory Visit (INDEPENDENT_AMBULATORY_CARE_PROVIDER_SITE_OTHER): Admitting: Internal Medicine

## 2024-01-24 VITALS — BP 110/66 | HR 64 | Wt 212.6 lb

## 2024-01-24 DIAGNOSIS — E1169 Type 2 diabetes mellitus with other specified complication: Secondary | ICD-10-CM | POA: Diagnosis not present

## 2024-01-24 DIAGNOSIS — E118 Type 2 diabetes mellitus with unspecified complications: Secondary | ICD-10-CM

## 2024-01-24 DIAGNOSIS — E042 Nontoxic multinodular goiter: Secondary | ICD-10-CM

## 2024-01-24 DIAGNOSIS — E1165 Type 2 diabetes mellitus with hyperglycemia: Secondary | ICD-10-CM | POA: Diagnosis not present

## 2024-01-24 DIAGNOSIS — Z794 Long term (current) use of insulin: Secondary | ICD-10-CM | POA: Diagnosis not present

## 2024-01-24 DIAGNOSIS — E782 Mixed hyperlipidemia: Secondary | ICD-10-CM

## 2024-01-24 LAB — POCT GLYCOSYLATED HEMOGLOBIN (HGB A1C): Hemoglobin A1C: 6.1 % — AB (ref 4.0–5.6)

## 2024-01-24 MED ORDER — LANTUS SOLOSTAR 100 UNIT/ML ~~LOC~~ SOPN: 16.0000 [IU] | PEN_INJECTOR | Freq: Every day | SUBCUTANEOUS | 3 refills | Status: AC

## 2024-01-24 NOTE — Progress Notes (Signed)
 Patient ID: Jessica [name] State, female   DOB: 03-15-1951, 73 y.o.   MRN: [id number]  HPI: Jessica [name] is a 73 y.o.-year-old female, returning for follow-up for DM2, dx in 2000, insulin -dependent since 2005, uncontrolled, with complications (DR, PN, gastroparesis, autonomic neuropathy). Pt. previously saw Dr. Kassie, but last visit with me 3 months ago.  Interim history: No increased urination, blurry vision, chest pain.  She had nausea after she increased the Ozempic  dose before last visit, but now much better on Trulicity . She continues to go to the weight management clinic.  She had a R TKR in 08/2022.  No pain, just a little stiffness.  DM2: Reviewed HbA1c: Lab Results  Component Value Date   HGBA1C 6.2 (A) 10/24/2023   HGBA1C 6.2 (A) 07/17/2023   HGBA1C 6.1 (A) 04/10/2023   HGBA1C 6.3 (H) 08/31/2022   HGBA1C 7.2 (A) 06/14/2022   HGBA1C 6.6 (A) 02/08/2022   HGBA1C 6.3 (A) 10/19/2021   HGBA1C 7.7 (A) 04/13/2021   HGBA1C 8.2 (A) 01/12/2021   HGBA1C 8.1 (A) 08/12/2020   Prev. on: - Humulin  70/30 40 >> 32 units after b'fast  >> before b'fastand 8 >> 10 >> 14 units before dinner  - Trulicity  0.75 mg weekly - adjusted by Weight Loss Specialist (on nausea med)- could not tolerate a higher dose She had nausea and stomach pain with Ozempic .  Currently on: - Lantus  30 >> 25 >> 20 units at night - Fiasp   >> Humalog : 7-9 >> 5-7 >> actually taking 9 units before b'fast 5-7 units before dinner >> actually taking approximately 9 units this at bedtime! - Trulicity  0.75 >> now 1.5 mg weekly  - increased 06/2023.  She has some nausea with it.  Pt checks her sugars 0-1x a day and they are: - am: 120-180 >> 150-160 >> 64-110, 121 >> 60-120 >> 60-110 - 2h after b'fast: n/c - before lunch: n/c >> 74, 90-100 >> 60-113 >> n/c - 2h after lunch: n/c >> 320 >> n/c >> 130-180 (sandwich) - before dinner: 110-120 >> 120 >> 113-120 >> 115-140 - 2h after dinner: n/c >> <130 >> n/c >> 120-193 >>  180-200 - bedtime: n/c >> 64 x1 >> 54 >> n/c - nighttime: n/c >> 62 Lowest sugar was 54 >> 60 >> 62; she has hypoglycemia awareness at 100.  Highest sugar was 320 >> 250 (carbs, starches) >> 193  Glucometer: One Touch ultra  She goes to the weight management clinic.  - no CKD, last BUN/creatinine:  Lab Results  Component Value Date   BUN 21 10/24/2023   BUN 24 (H) 06/05/2023   CREATININE 1.15 10/24/2023   CREATININE 0.83 06/05/2023   Lab Results  Component Value Date   MICRALBCREAT <14 03/11/2020   MICRALBCREAT <3 03/12/2019   MICRALBCREAT 3.6 04/04/2012   MICRALBCREAT 3.6 01/14/2009   MICRALBCREAT 8.5 04/01/2008  She is on olmesartan  40 mg daily.  -+ HL; last set of lipids: Lab Results  Component Value Date   CHOL 132 10/24/2023   HDL 53.40 10/24/2023   LDLCALC 69 10/24/2023   LDLDIRECT 143.2 11/23/2006   TRIG 46.0 10/24/2023   CHOLHDL 2 10/24/2023  She is on Lipitor 40 mg daily.  - last eye exam was  on 08/23/2023: No DR.  Previously + DR in 2021.   - + numbness and tingling in her feet.  On Neurontin  600 mg in a.m. and 1200 mg at night-refilled by podiatry.  Last foot exam 04/17/2023.  She saw Dr. Verta  since last visit, but a diabetic foot exam was not performed at that time.  MNG:  Thyroid  U/S (11/18/2019): Parenchymal Echotexture: Mildly heterogenous  Isthmus: 0.5 cm  Right lobe: 7.0 cm x 1.7 cm x 1.7 cm  Left lobe: 4.5 cm x 1.3 cm x 1.5 cm _________________________________________________________   Estimated total number of nodules >/= 1 cm: 3 _________________________________________________________   Nodule # 1:  Location: Isthmus; lower  Maximum size: 1.2 cm; Other 2 dimensions: 1.1 cm x 1.1 cm  Composition: cannot determine (2)  Echogenicity: isoechoic (1) Nodule does not meet criteria for surveillance or biopsy _________________________________________________________   Nodule # 2:  Location: Right; Mid  Maximum size: 1.3 cm; Other 2  dimensions: 1.1 cm x 0.8 cm  Composition: cannot determine (2)  Echogenicity: isoechoic (1) Nodule does not meet criteria for surveillance or biopsy  _________________________________________________________   Nodule # 3: Location: Right; Inferior  Maximum size: 1.9 cm; Other 2 dimensions: 1.6 cm x 1.2 cm  Composition: cannot determine (2)  Echogenicity: isoechoic (1) Nodule meets criteria for surveillance _________________________________________________________   No adenopathy   IMPRESSION: Multinodular thyroid .   Right inferior thyroid  nodule (labeled 3, TR 3, 1.9 cm) meets criteria for surveillance, as designated by the newly established ACR TI-RADS criteria. Surveillance ultrasound study recommended to be performed annually up to 5 years.   She remembers having had a thyroid  Bx by Dr. Kassie >> reportedly benign.  Thyroid  U/S (02/17/2022): Parenchymal Echotexture: Mildly heterogenous  Isthmus: 1.3 cm  Right lobe: 5.2 cm x 1.4 cm x 1.7 cm  Left lobe: 4.9 cm x 1.1 cm x 1.5 cm  _________________________________________________________   Estimated total number of nodules >/= 1 cm: 2 _________________________________________________________   Nodule labeled 1 in the isthmus, 1.2 cm, unchanged in size. Nodule remains TR 3 and meets criteria for surveillance.   Nodule labeled 2 right lower thyroid , slightly smaller now measuring 1.1 cm. Nodule has spongiform characteristics on the current ultrasound and does not meet criteria for further surveillance.   No adenopathy   IMPRESSION: Isthmic thyroid  nodule (labeled 1, 1.2 cm, TR 3) meets criteria for surveillance, as designated by the newly established ACR TI-RADS criteria. Surveillance ultrasound study recommended to be performed annually up to 5 years, which would be July 2026.  Thyroid  U/S (04/17/2023): Parenchymal Echotexture: Mildly heterogenous  Isthmus: 0.4 cm  Right lobe: 5.2 x 1.8 x 1.9 cm  Left lobe: 4.5 x  1.4 x 1.4 cm  _________________________________________________________   Estimated total number of nodules >/= 1 cm: 2  ________________________________________________________   Nodule # 1:  Prior biopsy: No  Location: Isthmus  Maximum size: 1.3 cm; Other 2 dimensions: 1.3 x 1.2 cm, previously, 1.2 cm  Composition: solid/almost completely solid (2)  Echogenicity: isoechoic (1) Given size (<1.4 cm) and appearance, this nodule does NOT meet TI-RADS criteria for biopsy or dedicated follow-up.  _________________________________________________________   1.1 cm vague nodularity remaining in the inferior right lobe appears stable since the prior study and does not meet criteria for follow-up. No enlarged or abnormal appearing lymph nodes are identified.   IMPRESSION: Stable 1.3 cm isoechoic nodule in the isthmus and 1.1 cm vague nodularity in the inferior right lobe. Neither nodule meets criteria for dedicated follow-up.  Reviewed TSH levels: Lab Results  Component Value Date   TSH 1.56 04/17/2023   TSH 1.04 04/12/2022   TSH 0.91 04/13/2021   TSH 1.320 09/07/2020   TSH 2.15 10/16/2018   TSH 1.27 10/25/2017   TSH 1.46 10/02/2016  TSH 1.55 11/03/2015   TSH 0.74 05/21/2013   TSH 1.00 11/13/2012   TSH 1.680 04/04/2012   TSH 1.43 08/10/2010   TSH 1.10 04/01/2008   TSH 1.24 11/23/2006   Pt denies: - feeling nodules in neck - hoarseness - dysphagia - choking  She had a kidney stone in 06/2023.  ROS: + see HPI  Past Medical History:  Diagnosis Date   Anxiety    Arthritis    bilateral knees   Asthma    childhood, inhaler for wheezing PRN   Autonomic neuropathy    diabetic   Cataract    Celiac disease    Constipation    Depression    Diabetes mellitus    type II, Hemoglobin A1C 9.9 10/05/2011   Diabetic neuropathy (HCC)    Diverticulosis    Fatty liver    Gastroparesis    GERD (gastroesophageal reflux disease)    History of claustrophobia    with MRI tests    Hyperlipidemia    Hypertension    IBS (irritable bowel syndrome)    Kidney stones    Personal history of colonic polyps 03/2010   hyperplastic   PONV (postoperative nausea and vomiting)    Shingles 2021   Past Surgical History:  Procedure Laterality Date   APPENDECTOMY     CATARACT EXTRACTION Right 04/29/2018   CATARACT EXTRACTION Left 03/2018   COLONOSCOPY     DILATATION & CURRETTAGE/HYSTEROSCOPY WITH RESECTOCOPE N/A 03/24/2013   Procedure: DILATATION & CURETTAGE, HYSTEROSCOPY WITH RESECTION;  Surgeon: Oneil FORBES Piety, MD;  Location: [facility];  Service: Gynecology;  Laterality: N/A;   HYSTEROSCOPY WITH D & C N/A 11/08/2015   Procedure: DILATATION AND CURETTAGE /HYSTEROSCOPY;  Surgeon: Oneil FORBES Piety, MD;  Location: [facility];  Service: Gynecology;  Laterality: N/A;   left breast cyst removal  2000   LEFT HEART CATH AND CORONARY ANGIOGRAPHY N/A 10/31/2018   Procedure: LEFT HEART CATH AND CORONARY ANGIOGRAPHY;  Surgeon: Dann Candyce RAMAN, MD;  Location: [facility];  Service: Cardiovascular;  Laterality: N/A;   TOTAL KNEE ARTHROPLASTY Right 09/12/2022   Procedure: RIGHT TOTAL KNEE ARTHROPLASTY;  Surgeon: Vernetta Lonni GRADE, MD;  Location: [facility];  Service: Orthopedics;  Laterality: Right;   TUBAL LIGATION     Social History   Socioeconomic History   Marital status: Married    Spouse name: Devar Jaffee   Number of children: 2   Years of education: Not on file   Highest education level: Not on file  Occupational History   Occupation: Conservation officer, 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    Comment: parttime   Occupation: retired  Tobacco Use   Smoking status: Never   Smokeless tobacco: Never  Vaping Use   Vaping status: Never Used  Substance and Sexual Activity   Alcohol use: No   Drug use: No   Sexual activity: Yes    Birth control/protection: Post-menopausal, Surgical  Other Topics Concern   Not on file  Social History Narrative   Not on file   Social  Drivers of Health   Financial Resource Strain: Low Risk  (03/22/2023)   Overall Financial Resource Strain (CARDIA)    Difficulty of Paying Living Expenses: Not hard at all  Food Insecurity: No Food Insecurity (03/22/2023)   Hunger Vital Sign    Worried About Running Out of Food in the Last Year: Never true    Ran Out of Food in the Last Year: Never true  Transportation Needs: No  Transportation Needs (03/22/2023)   PRAPARE - Administrator, Civil Service (Medical): No    Lack of Transportation (Non-Medical): No  Physical Activity: Sufficiently Active (03/22/2023)   Exercise Vital Sign    Days of Exercise per Week: 5 days    Minutes of Exercise per Session: 30 min  Stress: Stress Concern Present (03/22/2023)   [employer] of Occupational Health - Occupational Stress Questionnaire    Feeling of Stress : Rather much  Social Connections: Socially Integrated (03/22/2023)   Social Connection and Isolation Panel    Frequency of Communication with Friends and Family: More than three times a week    Frequency of Social Gatherings with Friends and Family: Twice a week    Attends Religious Services: More than 4 times per year    Active Member of [employer] or Organizations: Yes    Attends Banker Meetings: 1 to 4 times per year    Marital Status: Married  Catering manager Violence: Not At Risk (03/22/2023)   Humiliation, Afraid, Rape, and Kick questionnaire    Fear of Current or Ex-Partner: No    Emotionally Abused: No    Physically Abused: No    Sexually Abused: No   Current Outpatient Medications on File Prior to Visit  Medication Sig Dispense Refill   albuterol  (VENTOLIN  HFA) 108 (90 Base) MCG/ACT inhaler TAKE 2 PUFFS BY MOUTH EVERY 6 HOURS AS NEEDED FOR WHEEZE OR SHORTNESS OF BREATH 6.7 each 3   amLODipine  (NORVASC ) 5 MG tablet Take 1 tablet (5 mg total) by mouth daily. 90 tablet 3   atorvastatin  (LIPITOR) 80 MG tablet Take 1 tablet (80 mg total) by mouth at  bedtime. 90 tablet 3   BD PEN NEEDLE NANO 2ND GEN 32G X 4 MM MISC USE 3-4X A DAY 300 each 3   celecoxib  (CELEBREX ) 200 MG capsule TAKE 1 CAPSULE BY MOUTH 2 TIMES DAILY BETWEEN MEALS AS NEEDED. 60 capsule 1   cimetidine  (TAGAMET ) 800 MG tablet Take 1 tablet (800 mg total) by mouth at bedtime. 90 tablet 1   dexlansoprazole  (DEXILANT ) 60 MG capsule Take 1 capsule (60 mg total) by mouth daily. 90 capsule 3   Dulaglutide  (TRULICITY ) 1.5 MG/0.5ML SOAJ Inject 1.5 mg into the skin once a week. 2 mL 0   furosemide  (LASIX ) 20 MG tablet TAKE 1 TABLET (20 MG TOTAL) BY MOUTH DAILY AS NEEDED FOR FLUID OR EDEMA. 90 tablet 3   gabapentin  (NEURONTIN ) 600 MG tablet Take one tablet in the morning and two tablets before bedtime 270 tablet 3   glucose blood (ONETOUCH ULTRA) test strip USE 2 TIMES DAILY. AND LANCETS 2/DAY 200 strip 3   insulin  glargine (LANTUS  SOLOSTAR) 100 UNIT/ML Solostar Pen Inject 25 Units into the skin at bedtime. 15 mL 3   insulin  lispro (HUMALOG  KWIKPEN) 100 UNIT/ML KwikPen Inject 4-9 Units into the skin 3 (three) times daily before meals. 30 mL 3   Insulin  Syringe-Needle U-100 (BD INSULIN  SYRINGE U/F) 31G X 5/16 1 ML MISC USE UP TO 3 TIMES A DAY 100 each 5   olmesartan  (BENICAR ) 40 MG tablet TAKE 1 TABLET BY MOUTH EVERY DAY 90 tablet 3   ondansetron  (ZOFRAN ) 4 MG tablet Take 1 tablet (4 mg total) by mouth every 8 (eight) hours as needed. 30 tablet 0   polyethylene glycol powder ([employer] PURELAX) 17 GM/SCOOP powder Take 17 g by mouth at bedtime.     sucralfate  (CARAFATE ) 1 GM/10ML suspension Take 10 mLs (1 g total)  by mouth daily as needed (nausea).     tamsulosin  (FLOMAX ) 0.4 MG CAPS capsule Take 1 capsule (0.4 mg total) by mouth daily. 10 capsule 0   valACYclovir  (VALTREX ) 500 MG tablet TAKE 1 TABLET (500 MG TOTAL) BY MOUTH DAILY. 90 tablet 3   Vitamin D , Ergocalciferol , (DRISDOL ) 1.25 MG (50000 UNIT) CAPS capsule Take 1 capsule (50,000 Units total) by mouth every 7 (seven) days. 5 capsule 0    No current facility-administered medications on file prior to visit.   Allergies  Allergen Reactions   Codeine  Nausea And Vomiting   Gluten Meal Other (See Comments)    No wheat or soy   Metronidazole Nausea And Vomiting   Nsaids Nausea And Vomiting and Other (See Comments)    Cannot tolerate large doses d/t Celiac disease    Soy Allergy  (Obsolete)    Wheat    Family History  Problem Relation Age of Onset   Hypertension Mother    Colon cancer Sister        dx in her early 52s   Diabetes Sister    Endometrial cancer Sister    Hypertension Brother    Other Father        2 collapsed lungs   COPD Father    Diabetes Father    Heart attack Father    Heart failure Father    High blood pressure Father    Colon polyps Neg Hx    Esophageal cancer Neg Hx    Stomach cancer Neg Hx    PE: BP 110/66 (BP Location: Left Arm, Patient Position: Sitting, Cuff Size: Large)   Pulse 64   Wt 212 lb 9.6 oz (96.4 kg)   SpO2 99%   BMI 35.38 kg/m  Wt Readings from Last 20 Encounters:  01/24/24 212 lb 9.6 oz (96.4 kg)  01/23/24 208 lb (94.3 kg)  12/19/23 207 lb (93.9 kg)  11/19/23 210 lb (95.3 kg)  10/24/23 210 lb (95.3 kg)  10/17/23 208 lb (94.3 kg)  09/12/23 208 lb (94.3 kg)  09/03/23 212 lb 4 oz (96.3 kg)  08/15/23 206 lb (93.4 kg)  08/09/23 213 lb 12.8 oz (97 kg)  07/18/23 205 lb (93 kg)  07/17/23 209 lb 12.8 oz (95.2 kg)  07/03/23 211 lb (95.7 kg)  06/19/23 211 lb (95.7 kg)  06/05/23 209 lb 14.1 oz (95.2 kg)  05/17/23 210 lb (95.3 kg)  04/18/23 208 lb (94.3 kg)  04/17/23 231 lb 6.4 oz (105 kg)  04/10/23 216 lb 12.8 oz (98.3 kg)  03/27/23 214 lb (97.1 kg)   Constitutional: overweight, in NAD Eyes: no exophthalmos ENT: no thyromegaly, no cervical lymphadenopathy Cardiovascular: RRR, No MRG Respiratory: CTA B Musculoskeletal: no deformities Skin:  no rashes Neurological: no tremor with outstretched hands Diabetic Foot Exam - Simple   Simple Foot Form Diabetic Foot  exam was performed with the following findings: Yes 01/24/2024  2:00 PM  Visual Inspection See comments: Yes Sensation Testing Intact to touch and monofilament testing bilaterally: Yes Pulse Check Posterior Tibialis and Dorsalis pulse intact bilaterally: Yes Comments + B hallux valgus    ASSESSMENT: 1. DM2, insulin -dependent, uncontrolled, with complications - gastroparesis - PN - Autonomic neuropathy - DR  2. HL  3.  Multinodular goiter  PLAN:  1. Patient with longstanding, previously uncontrolled type 2 diabetes, with improved control in the last 2 to 3 years, and the latest HbA1c stable, at 6.2%, at last visit.  At last visit, she had some low blood sugars, in  the 60s, and I advised her to decrease the Lantus  dose even more.  She continues to vary the dose of Humalog  based on the size of her meals.  Her weight management specialist had increased the dose of Trulicity  before last visit and we continued the same dose.  She had some nausea with it but she did not feel that this was bothersome enough to need to decrease the dose. - At today's visit, she, but they do increase after lunch (she is not taking them particularly after dinner.  Upon questioning, she is no taking Humalog  before dinner, but at bedtime, which we discussed puts her at risk for nocturnal hypoglycemia.  She had a low blood sugar at 62 which woke her up from sleep.  Moreover, she is taking a higher dose of Humalog  visit that she forgot the recommended dose.  At today's visit I advised her to reduce both the Lantus  and the Humalog  doses and take the Humalog  always 15 minutes before meals.  Will continue Trulicity  for now.  She only has occasional nausea with it, for which she has medication at hand prescribed by Dr. Delores in the weight management clinic. - I suggested to:  Patient Instructions  Please  decrease: - Lantus  16 units at night - Humalog  5-7 units 2x a day 15 min before meals  Continue: - Trulicity  1.5 mg  weekly  Please return in 4-6 months with your sugar log.   - we checked her HbA1c: 6.1% (lower) - advised to check sugars at different times of the day - 4x a day, rotating check times - advised for yearly eye exams >> she is UTD - will check an ACR today - return to clinic in  4-6  months  2. HL - Latest lipid panel was entirely at goal: Lab Results  Component Value Date   CHOL 132 10/24/2023   HDL 53.40 10/24/2023   LDLCALC 69 10/24/2023   LDLDIRECT 143.2 11/23/2006   TRIG 46.0 10/24/2023   CHOLHDL 2 10/24/2023  - She continues on Lipitor 40 mg daily without side effects  3. MNG - No neck compression symptoms or masses felt on palpation of her neck today - Latest TSH was normal: Lab Results  Component Value Date   TSH 1.56 04/17/2023  -She had a thyroid  ultrasound in 2021 which showed that the nodules remained unchanged.  The right lower pole nodule qualified for follow-up based on size.  Reportedly she had a benign biopsy-performed by Dr. Kassie.  I was not able to find the records on this.  We repeated another ultrasound 02/17/2022 and the 1.2 cm isthmic nodule was stable and still met criteria for follow-up.  The right lower nodule was smaller.  After last visit, we checked another U/S (04/17/2023): Stable, small, nodules, not worrisome >> no further imaging is necessary - We will continue to follow her clinically for now   Lela Fendt, MD PhD [facility]

## 2024-01-24 NOTE — Patient Instructions (Addendum)
 Please  decrease: - Lantus  16 units at night - Humalog  5-7 units 2x a day 15 min before meals  Continue: - Trulicity  1.5 mg weekly  Please return in 4-6 months with your sugar log.

## 2024-01-25 ENCOUNTER — Ambulatory Visit: Payer: Self-pay | Admitting: Internal Medicine

## 2024-01-25 LAB — MICROALBUMIN / CREATININE URINE RATIO
Creatinine, Urine: 28 mg/dL (ref 20–275)
Microalb, Ur: <0.2 mg/dL

## 2024-02-20 ENCOUNTER — Ambulatory Visit: Admitting: Bariatrics

## 2024-02-20 ENCOUNTER — Encounter: Payer: Self-pay | Admitting: Bariatrics

## 2024-02-20 VITALS — BP 152/63 | HR 72 | Temp 98.1°F | Ht 65.0 in | Wt 207.0 lb

## 2024-02-20 DIAGNOSIS — E1142 Type 2 diabetes mellitus with diabetic polyneuropathy: Secondary | ICD-10-CM

## 2024-02-20 DIAGNOSIS — E669 Obesity, unspecified: Secondary | ICD-10-CM

## 2024-02-20 DIAGNOSIS — Z6834 Body mass index (BMI) 34.0-34.9, adult: Secondary | ICD-10-CM

## 2024-02-20 DIAGNOSIS — I10 Essential (primary) hypertension: Secondary | ICD-10-CM | POA: Diagnosis not present

## 2024-02-20 DIAGNOSIS — Z7985 Long-term (current) use of injectable non-insulin antidiabetic drugs: Secondary | ICD-10-CM

## 2024-02-20 DIAGNOSIS — E119 Type 2 diabetes mellitus without complications: Secondary | ICD-10-CM | POA: Diagnosis not present

## 2024-02-20 DIAGNOSIS — E6609 Other obesity due to excess calories: Secondary | ICD-10-CM

## 2024-02-20 MED ORDER — TRULICITY 1.5 MG/0.5ML ~~LOC~~ SOAJ: 1.5000 mg | SUBCUTANEOUS | 0 refills | Status: DC

## 2024-02-20 MED ORDER — ONDANSETRON HCL 4 MG PO TABS: 4.0000 mg | ORAL_TABLET | Freq: Three times a day (TID) | ORAL | 0 refills | Status: DC | PRN

## 2024-02-20 MED ORDER — VITAMIN D (ERGOCALCIFEROL) 1.25 MG (50000 UNIT) PO CAPS: 50000.0000 [IU] | ORAL_CAPSULE | ORAL | 0 refills | Status: AC

## 2024-02-20 NOTE — Progress Notes (Signed)
 WEIGHT SUMMARY AND BIOMETRICS  Weight Lost Since Last Visit: 1lb  Weight Gained Since Last Visit: 0lb   Vitals Temp: 98.1 F (36.7 C) BP: (!) 152/63 Pulse Rate: 72 SpO2: 98 %   Anthropometric Measurements Height: 5' 5 (1.651 m) Weight: 207 lb (93.9 kg) BMI (Calculated): 34.45 Weight at Last Visit: 208lb Weight Lost Since Last Visit: 1lb Weight Gained Since Last Visit: 0lb Starting Weight: 246lb Total Weight Loss (lbs): 33 lb (15 kg)   Body Composition  Body Fat %: 45.6 % Fat Mass (lbs): 94.6 lbs Muscle Mass (lbs): 107 lbs Total Body Water (lbs): 78.8 lbs Visceral Fat Rating : 14   Other Clinical Data Fasting: no Labs: no Today's Visit #: 44 Starting Date: 03/12/19    OBESITY Jessica [name] is here to discuss her progress with her obesity treatment plan along with follow-up of her obesity related diagnoses.    Nutrition Plan: the Category 3 plan - 80% adherence.  Current exercise: walking and cubie  Interim History:  She is down 1 lb since her last visit.  Eating all of the food on the plan., Protein intake is as prescribed, and Water intake is adequate.   Pharmacotherapy: Jessica [name] is on Trulicity  1.5 mg SQ weekly Adverse side effects: Nausea slowly improving over time, but still needs to take Zofran  occasionally.  Hunger is moderately controlled.  Cravings are moderately controlled. She is eating apples if increased craving.  Assessment/Plan:   Type II Diabetes HgbA1c is not at goal. Last A1c was 6.1 CBGs: Not checking      Episodes of hypoglycemia: no Medication(s): Trulicity  1.5 mg SQ weekly  Lab Results  Component Value Date   HGBA1C 6.1 (A) 01/24/2024   HGBA1C 6.2 (A) 10/24/2023   HGBA1C 6.2 (A) 07/17/2023   Lab Results  Component Value Date   MICROALBUR <0.2 01/24/2024   LDLCALC 69 10/24/2023   CREATININE 1.15 10/24/2023   Lab  Results  Component Value Date   GFR 47.41 (L) 10/24/2023   GFR 59.84 (L) 04/17/2023   GFR 57.36 (L) 04/12/2022    Plan: Continue and refill Trulicity  1.5 mg SQ weekly Will keep all carbohydrates low both sweets and starches.  Will continue exercise regimen to 30 to 60 minutes on most days of the week.  Aim for 7 to 9 hours of sleep nightly.   Hypertension Hypertension control uncertain.  Medication(s): Amlodipine  5 mg 1 daily  and Benicar  40 mg once daily   BP Readings from Last 3 Encounters:  02/20/24 (!) 152/63  01/24/24 110/66  01/23/24 (!) 155/80   Lab Results  Component Value Date   CREATININE 1.15 10/24/2023   CREATININE 0.83 06/05/2023   CREATININE 0.95 04/17/2023   Lab Results  Component Value Date   GFR 47.41 (L) 10/24/2023   GFR 59.84 (L) 04/17/2023   GFR 57.36 (L) 04/12/2022    Plan:  Continue all antihypertensives at current dosages. No added salt. Will keep sodium content to 1,500 mg or less per day.   Will continue to walk.  Will make her meals at home.     Generalized Obesity: Current BMI BMI (Calculated): 34.45   Pharmacotherapy Plan Continue and refill  Trulicity  1.5 mg SQ weekly  Jessica [name] is currently in the action stage of change. As such, her goal is to continue with weight loss efforts.  She has agreed to the Category 3 plan.  Exercise goals: Older adults should follow the adult guidelines. When older adults cannot meet the adult guidelines, they should be as physically active as their abilities and conditions will allow.   Behavioral modification strategies: increasing lean protein intake, no meal skipping, decrease eating out, meal planning , increase water intake, better snacking choices, planning for success, avoiding temptations, keep healthy foods in the home, increase frequency of journaling, measure portion sizes, and mindful eating.  Jessica [name] has agreed to follow-up with our clinic in 4 weeks.     Objective:   VITALS: Per patient if  applicable, see vitals. GENERAL: Alert and in no acute distress. CARDIOPULMONARY: No increased WOB. Speaking in clear sentences.  PSYCH: Pleasant and cooperative. Speech normal rate and rhythm. Affect is appropriate. Insight and judgement are appropriate. Attention is focused, linear, and appropriate.  NEURO: Oriented as arrived to appointment on time with no prompting.   Attestation Statements:   This was prepared with the assistance of Engineer, civil (consulting).  Occasional wrong-word or sound-a-like substitutions may have occurred due to the inherent limitations of voice recognition   Jessica Daring, DO

## 2024-03-03 ENCOUNTER — Encounter: Payer: Self-pay | Admitting: Radiology

## 2024-03-13 ENCOUNTER — Ambulatory Visit (INDEPENDENT_AMBULATORY_CARE_PROVIDER_SITE_OTHER): Admitting: Podiatry

## 2024-03-13 ENCOUNTER — Encounter: Payer: Self-pay | Admitting: *Deleted

## 2024-03-13 DIAGNOSIS — M722 Plantar fascial fibromatosis: Secondary | ICD-10-CM | POA: Diagnosis not present

## 2024-03-13 DIAGNOSIS — M7671 Peroneal tendinitis, right leg: Secondary | ICD-10-CM | POA: Diagnosis not present

## 2024-03-13 MED ORDER — TRIAMCINOLONE ACETONIDE 40 MG/ML IJ SUSP
20.0000 mg | Freq: Once | INTRAMUSCULAR | Status: AC
  Administered 2024-03-13: 20 mg

## 2024-03-13 NOTE — Progress Notes (Signed)
 Jessica [name]                                          MRN: [id number]   03/13/2024   The [facility] Quality Team Specialist reviewed this patient medical record for the purposes of chart review for care gap closure. The following were reviewed: abstraction for care gap closure-glycemic status assessment and kidney health evaluation for diabetes:eGFR  and uACR.    [facility] Quality Team

## 2024-03-14 NOTE — Progress Notes (Signed)
 She presents today with a right foot flareup of her arch pain and pain along the medial aspect of the right ankle.  Objective: Vitals are stable alert and oriented x 3.  Pulses are palpable.  She has a mild edema about the right ankle.  Mild tenderness on palpation posterior tibial tendon majority of her symptomatology is located overlying the plantar medial aspect of the calcaneal insertion site of the plantar fascia.  Assessment: Plantar fasciitis compensatory posterior tibial tendinitis mild pes planovalgus right foot.  Plan: Discussed etiology pathology and surgical therapies injected the right heel today 20 mg Kenalog  5 mg Marcaine  for maximal tenderness.  Discussed stretching exercise ice therapy shoe gear modifications will follow-up with her in a few months if necessary.

## 2024-03-18 ENCOUNTER — Other Ambulatory Visit: Payer: Self-pay | Admitting: Orthopaedic Surgery

## 2024-03-20 ENCOUNTER — Ambulatory Visit: Admitting: Podiatry

## 2024-03-20 ENCOUNTER — Encounter: Payer: Self-pay | Admitting: Bariatrics

## 2024-03-20 ENCOUNTER — Ambulatory Visit: Admitting: Bariatrics

## 2024-03-20 VITALS — BP 148/76 | HR 82 | Ht 65.0 in | Wt 205.0 lb

## 2024-03-20 DIAGNOSIS — E6609 Other obesity due to excess calories: Secondary | ICD-10-CM

## 2024-03-20 DIAGNOSIS — E1142 Type 2 diabetes mellitus with diabetic polyneuropathy: Secondary | ICD-10-CM

## 2024-03-20 DIAGNOSIS — Z6834 Body mass index (BMI) 34.0-34.9, adult: Secondary | ICD-10-CM | POA: Diagnosis not present

## 2024-03-20 DIAGNOSIS — E785 Hyperlipidemia, unspecified: Secondary | ICD-10-CM

## 2024-03-20 DIAGNOSIS — Z7985 Long-term (current) use of injectable non-insulin antidiabetic drugs: Secondary | ICD-10-CM

## 2024-03-20 DIAGNOSIS — E1169 Type 2 diabetes mellitus with other specified complication: Secondary | ICD-10-CM

## 2024-03-20 DIAGNOSIS — E669 Obesity, unspecified: Secondary | ICD-10-CM | POA: Diagnosis not present

## 2024-03-20 DIAGNOSIS — E119 Type 2 diabetes mellitus without complications: Secondary | ICD-10-CM | POA: Diagnosis not present

## 2024-03-20 MED ORDER — ONDANSETRON HCL 4 MG PO TABS: 4.0000 mg | ORAL_TABLET | Freq: Three times a day (TID) | ORAL | 0 refills | Status: DC | PRN

## 2024-03-20 MED ORDER — TRULICITY 1.5 MG/0.5ML ~~LOC~~ SOAJ
1.5000 mg | SUBCUTANEOUS | 0 refills | Status: AC
Start: 2024-03-20 — End: ?

## 2024-03-20 NOTE — Progress Notes (Signed)
 WEIGHT SUMMARY AND BIOMETRICS  Weight Lost Since Last Visit: 2lb  Weight Gained Since Last Visit: 0   Vitals BP: (!) 148/76 Pulse Rate: 82 SpO2: 100 %   Anthropometric Measurements Height: 5' 5 (1.651 m) Weight: 205 lb (93 kg) BMI (Calculated): 34.11 Weight at Last Visit: 207lb Weight Lost Since Last Visit: 2lb Weight Gained Since Last Visit: 0 Starting Weight: 246lb Total Weight Loss (lbs): 35 lb (15.9 kg)   Body Composition  Body Fat %: 44.8 % Fat Mass (lbs): 92.2 lbs Muscle Mass (lbs): 107.8 lbs Total Body Water (lbs): 75.8 lbs Visceral Fat Rating : 14   Other Clinical Data Fasting: no Labs: no Today's Visit #: 55 Starting Date: 03/12/19    OBESITY Deshanae is here to discuss her progress with her obesity treatment plan along with follow-up of her obesity related diagnoses.    Nutrition Plan: the Category 3 plan - 85% adherence.  Current exercise: walking and cubie  Interim History:  She is down 2 lbs since her last visit.  Eating all of the food on the plan., Protein intake is as prescribed, Is not skipping meals, Not journaling consistently., and Water intake is adequate.   Pharmacotherapy: Anjoli is on Trulicity  1.5 mg SQ weekly Adverse side effects: None Hunger is moderately controlled.  Cravings are moderately controlled.  Assessment/Plan:   Type II Diabetes HgbA1c is at goal. Last A1c was 6.1 Episodes of hypoglycemia: no Medication(s): Trulicity  1.5 mg SQ weekly She continues to adjust to Trulicity  and has to take her Zofran  less often.   Lab Results  Component Value Date   HGBA1C 6.1 (A) 01/24/2024   HGBA1C 6.2 (A) 10/24/2023   HGBA1C 6.2 (A) 07/17/2023   Lab Results  Component Value Date   MICROALBUR <0.2 01/24/2024   LDLCALC 69 10/24/2023   CREATININE 1.15 10/24/2023   Lab Results  Component Value Date   GFR 47.41  (L) 10/24/2023   GFR 59.84 (L) 04/17/2023   GFR 57.36 (L) 04/12/2022    Plan: Continue and refill Trulicity  1.5 mg SQ weekly Rx: Zofran   4 mg every 8 hours if needed, #  30 with no refills.  Will keep all carbohydrates low both sweets and starches.  Will continue exercise regimen to 30 to 60 minutes on most days of the week.  Aim for 7 to 9 hours of sleep nightly.  Eat more low glycemic index foods.    Hyperlipidemia LDL is at goal. Medication(s): Lipitor Cardiovascular risk factors: advanced age (older than 80 for men, 42 for women), diabetes mellitus, dyslipidemia, obesity (BMI >= 30 kg/m2), and sedentary lifestyle  Lab Results  Component Value Date   CHOL 132 10/24/2023   HDL 53.40 10/24/2023   LDLCALC 69 10/24/2023   LDLDIRECT 143.2 11/23/2006   TRIG 46.0 10/24/2023   CHOLHDL 2 10/24/2023   Lab Results  Component Value Date  ALT 11 10/24/2023   AST 16 10/24/2023   ALKPHOS 93 10/24/2023   BILITOT 0.6 10/24/2023   The 10-year ASCVD risk score (Arnett DK, et al., 2019) is: 26.5%   Values used to calculate the score:     Age: 31 years     Clincally relevant sex: Female     Is Non-Hispanic African American: Yes     Diabetic: Yes     Tobacco smoker: No     Systolic Blood Pressure: 148 mmHg     Is BP treated: Yes     HDL Cholesterol: 53.4 mg/dL     Total Cholesterol: 132 mg/dL  Plan:  Continue statin.  Information sheet on healthy vs unhealthy fats.  Will avoid all trans fats.  Will read labels Will minimize saturated fats except the following: low fat meats in moderation, diary, and limited dark chocolate.     Generalized Obesity: Current BMI BMI (Calculated): 34.11   Pharmacotherapy Plan Continue and refill  Trulicity  1.5 mg SQ weekly  Shanetha is currently in the action stage of change. As such, her goal is to continue with weight loss efforts.  She has agreed to the Category 3 plan.  Exercise goals: Older adults should determine their level of effort for  physical activity relative to their level of fitness.   Behavioral modification strategies: increasing lean protein intake, no meal skipping, decrease eating out, increase water intake, better snacking choices, increasing fiber rich foods, keep healthy foods in the home, weigh protein portions, measure portion sizes, and mindful eating.  Sarabella has agreed to follow-up with our clinic in 4 weeks.    Objective:   VITALS: Per patient if applicable, see vitals. GENERAL: Alert and in no acute distress. CARDIOPULMONARY: No increased WOB. Speaking in clear sentences.  PSYCH: Pleasant and cooperative. Speech normal rate and rhythm. Affect is appropriate. Insight and judgement are appropriate. Attention is focused, linear, and appropriate.  NEURO: Oriented as arrived to appointment on time with no prompting.   Attestation Statements:   This was prepared with the assistance of Engineer, Civil (consulting).  Occasional wrong-word or sound-a-like substitutions may have occurred due to the inherent limitations of voice recognition   Clayborne Daring, DO

## 2024-04-01 ENCOUNTER — Ambulatory Visit: Admitting: Podiatry

## 2024-04-01 ENCOUNTER — Ambulatory Visit: Payer: Medicare Other

## 2024-04-01 VITALS — Ht 64.0 in | Wt 205.0 lb

## 2024-04-01 DIAGNOSIS — Z0000 Encounter for general adult medical examination without abnormal findings: Secondary | ICD-10-CM | POA: Diagnosis not present

## 2024-04-01 NOTE — Progress Notes (Addendum)
 Chief Complaint  Patient presents with   Medicare Wellness     Subjective:   Jessica [name] is a 73 y.o. female who presents for a Medicare Annual Wellness Visit.  Visit info / Clinical Intake: Medicare Wellness Visit Type:: Subsequent Annual Wellness Visit Persons participating in visit and providing information:: patient Medicare Wellness Visit Mode:: Telephone If telephone:: video declined Since this visit was completed virtually, some vitals may be partially provided or unavailable. Missing vitals are due to the limitations of the virtual format.: Unable to obtain vitals - no equipment If Telephone or Video please confirm:: I connected with patient using audio/video enable telemedicine. I verified patient identity with two identifiers, discussed telehealth limitations, and patient agreed to proceed. Patient Location:: home Provider Location:: home office Interpreter Needed?: No Pre-visit prep was completed: yes AWV questionnaire completed by patient prior to visit?: no Living arrangements:: lives with spouse/significant other Patient's Overall Health Status Rating: good Typical amount of pain: none Does pain affect daily life?: no Are you currently prescribed opioids?: no  Dietary Habits and Nutritional Risks How many meals a day?: 2 Eats fruit and vegetables daily?: yes Most meals are obtained by: preparing own meals In the last 2 weeks, have you had any of the following?: (!) nausea, vomiting, diarrhea (due to medication) Diabetic:: (!) yes Any non-healing wounds?: no How often do you check your BS?: 1 Would you like to be referred to a Nutritionist or for Diabetic Management? : no  Functional Status Activities of Daily Living (to include ambulation/medication): Independent Ambulation: Independent with device- listed below Home Assistive Devices/Equipment: Eyeglasses Medication Administration: Independent Home Management (perform basic housework or laundry):  Independent Manage your own finances?: yes Primary transportation is: driving Concerns about vision?: no *vision screening is required for WTM* Concerns about hearing?: no  Fall Screening Falls in the past year?: 0 Number of falls in past year: 0 Was there an injury with Fall?: 0 Fall Risk Category Calculator: 0 Patient Fall Risk Level: Low Fall Risk  Fall Risk Patient at Risk for Falls Due to: Impaired mobility Fall risk Follow up: Falls prevention discussed  Home and Transportation Safety: All rugs have non-skid backing?: N/A, no rugs All stairs or steps have railings?: yes Grab bars in the bathtub or shower?: yes Have non-skid surface in bathtub or shower?: yes Good home lighting?: yes Regular seat belt use?: yes  Cognitive Assessment Difficulty concentrating, remembering, or making decisions? : no Will 6CIT or Mini Cog be Completed: no 6CIT or Mini Cog Declined: patient alert, oriented, able to answer questions appropriately and recall recent events  Advance Directives (For Healthcare) Does Patient Have a Medical Advance Directive?: No Would patient like information on creating a medical advance directive?: No - Patient declined  Reviewed/Updated  Reviewed/Updated: Reviewed All (Medical, Surgical, Family, Medications, Allergies, Care Teams, Patient Goals)    Allergies (verified) Codeine , Gluten meal, Metronidazole, Nsaids, Soy allergy  (obsolete), and Wheat   Current Medications (verified) Outpatient Encounter Medications as of 04/01/2024  Medication Sig   albuterol  (VENTOLIN  HFA) 108 (90 Base) MCG/ACT inhaler TAKE 2 PUFFS BY MOUTH EVERY 6 HOURS AS NEEDED FOR WHEEZE OR SHORTNESS OF BREATH   amLODipine  (NORVASC ) 5 MG tablet Take 1 tablet (5 mg total) by mouth daily.   atorvastatin  (LIPITOR) 80 MG tablet Take 1 tablet (80 mg total) by mouth at bedtime.   BD PEN NEEDLE NANO 2ND GEN 32G X 4 MM MISC USE 3-4X A DAY   celecoxib  (CELEBREX ) 200 MG capsule TAKE  1 CAPSULE BY  MOUTH 2 TIMES DAILY BETWEEN MEALS AS NEEDED.   cimetidine  (TAGAMET ) 800 MG tablet Take 1 tablet (800 mg total) by mouth at bedtime.   dexlansoprazole  (DEXILANT ) 60 MG capsule Take 1 capsule (60 mg total) by mouth daily.   Dulaglutide  (TRULICITY ) 1.5 MG/0.5ML SOAJ Inject 1.5 mg into the skin once a week.   furosemide  (LASIX ) 20 MG tablet TAKE 1 TABLET (20 MG TOTAL) BY MOUTH DAILY AS NEEDED FOR FLUID OR EDEMA.   gabapentin  (NEURONTIN ) 600 MG tablet Take one tablet in the morning and two tablets before bedtime   glucose blood (ONETOUCH ULTRA) test strip USE 2 TIMES DAILY. AND LANCETS 2/DAY   insulin  glargine (LANTUS  SOLOSTAR) 100 UNIT/ML Solostar Pen Inject 16-18 Units into the skin at bedtime.   insulin  lispro (HUMALOG  KWIKPEN) 100 UNIT/ML KwikPen Inject 4-9 Units into the skin 3 (three) times daily before meals.   Insulin  Syringe-Needle U-100 (BD INSULIN  SYRINGE U/F) 31G X 5/16 1 ML MISC USE UP TO 3 TIMES A DAY   olmesartan  (BENICAR ) 40 MG tablet TAKE 1 TABLET BY MOUTH EVERY DAY   ondansetron  (ZOFRAN ) 4 MG tablet Take 1 tablet (4 mg total) by mouth every 8 (eight) hours as needed.   polyethylene glycol powder ([employer] PURELAX) 17 GM/SCOOP powder Take 17 g by mouth at bedtime.   sucralfate  (CARAFATE ) 1 GM/10ML suspension Take 10 mLs (1 g total) by mouth daily as needed (nausea).   valACYclovir  (VALTREX ) 500 MG tablet TAKE 1 TABLET (500 MG TOTAL) BY MOUTH DAILY.   Vitamin D , Ergocalciferol , (DRISDOL ) 1.25 MG (50000 UNIT) CAPS capsule Take 1 capsule (50,000 Units total) by mouth every 7 (seven) days.   tamsulosin  (FLOMAX ) 0.4 MG CAPS capsule Take 1 capsule (0.4 mg total) by mouth daily. (Patient not taking: Reported on 04/01/2024)   No facility-administered encounter medications on file as of 04/01/2024.    History: Past Medical History:  Diagnosis Date   Anxiety    Arthritis    bilateral knees   Asthma    childhood, inhaler for wheezing PRN   Autonomic neuropathy    diabetic   Cataract     Celiac disease    Constipation    Depression    Diabetes mellitus    type II, Hemoglobin A1C 9.9 10/05/2011   Diabetic neuropathy (HCC)    Diverticulosis    Fatty liver    Gastroparesis    GERD (gastroesophageal reflux disease)    History of claustrophobia    with MRI tests   Hyperlipidemia    Hypertension    IBS (irritable bowel syndrome)    Kidney stones    Personal history of colonic polyps 03/2010   hyperplastic   PONV (postoperative nausea and vomiting)    Shingles 2021   Past Surgical History:  Procedure Laterality Date   APPENDECTOMY     CATARACT EXTRACTION Right 04/29/2018   CATARACT EXTRACTION Left 03/2018   COLONOSCOPY     DILATATION & CURRETTAGE/HYSTEROSCOPY WITH RESECTOCOPE N/A 03/24/2013   Procedure: DILATATION & CURETTAGE, HYSTEROSCOPY WITH RESECTION;  Surgeon: Oneil FORBES Piety, MD;  Location: [facility];  Service: Gynecology;  Laterality: N/A;   HYSTEROSCOPY WITH D & C N/A 11/08/2015   Procedure: DILATATION AND CURETTAGE /HYSTEROSCOPY;  Surgeon: Oneil FORBES Piety, MD;  Location: [facility];  Service: Gynecology;  Laterality: N/A;   left breast cyst removal  2000   LEFT HEART CATH AND CORONARY ANGIOGRAPHY N/A 10/31/2018   Procedure: LEFT HEART CATH AND CORONARY ANGIOGRAPHY;  Surgeon:  Dann Candyce RAMAN, MD;  Location: [facility] INVASIVE CV LAB;  Service: Cardiovascular;  Laterality: N/A;   TOTAL KNEE ARTHROPLASTY Right 09/12/2022   Procedure: RIGHT TOTAL KNEE ARTHROPLASTY;  Surgeon: Vernetta Lonni GRADE, MD;  Location: [facility] OR;  Service: Orthopedics;  Laterality: Right;   TUBAL LIGATION     Family History  Problem Relation Age of Onset   Hypertension Mother    Colon cancer Sister        dx in her early 49s   Diabetes Sister    Endometrial cancer Sister    Hypertension Brother    Other Father        2 collapsed lungs   COPD Father    Diabetes Father    Heart attack Father    Heart failure Father    High blood pressure Father    Colon polyps Neg Hx    Esophageal cancer  Neg Hx    Stomach cancer Neg Hx    Social History   Occupational History   Occupation: conservation officer, 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    Comment: parttime   Occupation: retired  Tobacco Use   Smoking status: Never   Smokeless tobacco: Never  Vaping Use   Vaping status: Never Used  Substance and Sexual Activity   Alcohol use: No   Drug use: No   Sexual activity: Yes    Birth control/protection: Post-menopausal, Surgical   Tobacco Counseling Counseling given: Not Answered  SDOH Screenings   Food Insecurity: No Food Insecurity (04/01/2024)  Housing: Unknown (04/01/2024)  Transportation Needs: No Transportation Needs (04/01/2024)  Utilities: Not At Risk (04/01/2024)  Depression (PHQ2-9): Low Risk  (04/01/2024)  Financial Resource Strain: Low Risk  (03/22/2023)  Physical Activity: Sufficiently Active (04/01/2024)  Social Connections: Moderately Integrated (04/01/2024)  Stress: Stress Concern Present (04/01/2024)  Tobacco Use: Low Risk  (04/01/2024)  Health Literacy: Adequate Health Literacy (04/01/2024)   See flowsheets for full screening details  Depression Screen PHQ 2 & 9 Depression Scale- Over the past 2 weeks, how often have you been bothered by any of the following problems? Little interest or pleasure in doing things: 0 Feeling down, depressed, or hopeless (PHQ Adolescent also includes...irritable): 0 PHQ-2 Total Score: 0 Trouble falling or staying asleep, or sleeping too much: 2 Feeling tired or having little energy: 1 Poor appetite or overeating (PHQ Adolescent also includes...weight loss): 1 Feeling bad about yourself - or that you are a failure or have let yourself or your family down: 0 Trouble concentrating on things, such as reading the newspaper or watching television (PHQ Adolescent also includes...like school work): 0 Moving or speaking so slowly that other people could have noticed. Or the opposite - being so fidgety or restless that you have been moving  around a lot more than usual: 0 Thoughts that you would be better off dead, or of hurting yourself in some way: 0 PHQ-9 Total Score: 6 If you checked off any problems, how difficult have these problems made it for you to do your work, take care of things at home, or get along with other people?: Not difficult at all     Goals Addressed               This Visit's Progress     weight loss (pt-stated)        Weight loss             Objective:    Today's Vitals   04/01/24 1047  Weight: 205  lb (93 kg)  Height: 5' 4 (1.626 m)   Body mass index is 35.19 kg/m.  Hearing/Vision screen Hearing Screening - Comments:: Pt denies any hearing issues  Vision Screening - Comments:: Wears rx glasses - up to date with routine eye exams with Dr Vonzella Immunizations and Health Maintenance Health Maintenance  Topic Date Due   Mammogram  06/30/2023   COVID-19 Vaccine (4 - 2025-26 season) 12/31/2023   Influenza Vaccine  07/29/2024 (Originally 11/30/2023)   HEMOGLOBIN A1C  07/23/2024   OPHTHALMOLOGY EXAM  08/22/2024   Colonoscopy  10/13/2024   Diabetic kidney evaluation - eGFR measurement  10/23/2024   Diabetic kidney evaluation - Urine ACR  01/23/2025   FOOT EXAM  01/23/2025   Medicare Annual Wellness (AWV)  04/01/2025   Bone Density Scan  10/25/2025   DTaP/Tdap/Td (3 - Td or Tdap) 10/26/2027   Pneumococcal Vaccine: 50+ Years  Completed   Hepatitis C Screening  Completed   Zoster Vaccines- Shingrix   Completed   Meningococcal B Vaccine  Aged Out        Assessment/Plan:  This is a routine wellness examination for Jessica [name].  Patient Care Team: Jodie Lavern CROME, MD as PCP - General (Family Medicine) Dann Candyce RAMAN, MD as PCP - Cardiology (Cardiology) Lenon Oneil BRAVO, MD as Attending Physician (Obstetrics and Gynecology) Kassie Mallick, MD (Inactive) as Consulting Physician (Endocrinology) Pyrtle, Gordy HERO, MD as Consulting Physician (Gastroenterology) Vernona Slough, OD as  Consulting Physician (Ophthalmology) Verta Royden DASEN, DPM as Consulting Physician (Podiatry) Nicholaus, Sherlean CROME, [facility] (Inactive) as Pharmacist (Pharmacist)  I have personally reviewed and noted the following in the patient's chart:   Medical and social history Use of alcohol, tobacco or illicit drugs  Current medications and supplements including opioid prescriptions. Functional ability and status Nutritional status Physical activity Advanced directives List of other physicians Hospitalizations, surgeries, and ER visits in previous 12 months Vitals Screenings to include cognitive, depression, and falls Referrals and appointments  No orders of the defined types were placed in this encounter.  In addition, I have reviewed and discussed with patient certain preventive protocols, quality metrics, and best practice recommendations. A written personalized care plan for preventive services as well as general preventive health recommendations were provided to patient.   Ellouise VEAR Haws, LPN   [id number]   Return in 1 year (on 04/06/2025) for @ 10:40 .  After Visit Summary: (MyChart) Due to this being a telephonic visit, the after visit summary with patients personalized plan was offered to patient via MyChart   Nurse Notes: request for mammogram sent to [facility]

## 2024-04-01 NOTE — Patient Instructions (Signed)
 Jessica [name],  Thank you for taking the time for your Medicare Wellness Visit. I appreciate your continued commitment to your health goals. Please review the care plan we discussed, and feel free to reach out if I can assist you further.  Please note that Annual Wellness Visits do not include a physical exam. Some assessments may be limited, especially if the visit was conducted virtually. If needed, we may recommend an in-person follow-up with your provider.  Ongoing Care Seeing your primary care provider every 3 to 6 months helps us  monitor your health and provide consistent, personalized care.   Referrals If a referral was made during today's visit and you haven't received any updates within two weeks, please contact the referred provider directly to check on the status.  Recommended Screenings:  Health Maintenance  Topic Date Due   Breast Cancer Screening  06/30/2023   Flu Shot  11/30/2023   COVID-19 Vaccine (4 - 2025-26 season) 12/31/2023   Hemoglobin A1C  07/23/2024   Eye exam for diabetics  08/22/2024   Colon Cancer Screening  10/13/2024   Yearly kidney function blood test for diabetes  10/23/2024   Yearly kidney health urinalysis for diabetes  01/23/2025   Complete foot exam   01/23/2025   Medicare Annual Wellness Visit  04/01/2025   Osteoporosis screening with Bone Density Scan  10/25/2025   DTaP/Tdap/Td vaccine (3 - Td or Tdap) 10/26/2027   Pneumococcal Vaccine for age over 39  Completed   Hepatitis C Screening  Completed   Zoster (Shingles) Vaccine  Completed   Meningitis B Vaccine  Aged Out       04/01/2024   10:49 AM  Advanced Directives  Does Patient Have a Medical Advance Directive? No  Would patient like information on creating a medical advance directive? No - Patient declined    Vision: Annual vision screenings are recommended for early detection of glaucoma, cataracts, and diabetic retinopathy. These exams can also reveal signs of chronic conditions such as  diabetes and high blood pressure.  Dental: Annual dental screenings help detect early signs of oral cancer, gum disease, and other conditions linked to overall health, including heart disease and diabetes.  Please see the attached documents for additional preventive care recommendations.

## 2024-04-16 ENCOUNTER — Encounter: Payer: Self-pay | Admitting: Bariatrics

## 2024-04-16 ENCOUNTER — Ambulatory Visit: Admitting: Bariatrics

## 2024-04-16 VITALS — BP 135/73 | HR 87 | Ht 65.0 in | Wt 206.0 lb

## 2024-04-16 DIAGNOSIS — E119 Type 2 diabetes mellitus without complications: Secondary | ICD-10-CM

## 2024-04-16 DIAGNOSIS — I152 Hypertension secondary to endocrine disorders: Secondary | ICD-10-CM | POA: Diagnosis not present

## 2024-04-16 DIAGNOSIS — Z6834 Body mass index (BMI) 34.0-34.9, adult: Secondary | ICD-10-CM | POA: Diagnosis not present

## 2024-04-16 DIAGNOSIS — E1159 Type 2 diabetes mellitus with other circulatory complications: Secondary | ICD-10-CM

## 2024-04-16 DIAGNOSIS — Z7985 Long-term (current) use of injectable non-insulin antidiabetic drugs: Secondary | ICD-10-CM | POA: Diagnosis not present

## 2024-04-16 DIAGNOSIS — E669 Obesity, unspecified: Secondary | ICD-10-CM

## 2024-04-16 DIAGNOSIS — E1142 Type 2 diabetes mellitus with diabetic polyneuropathy: Secondary | ICD-10-CM

## 2024-04-16 MED ORDER — TRULICITY 1.5 MG/0.5ML ~~LOC~~ SOAJ: 1.5000 mg | SUBCUTANEOUS | 0 refills | Status: DC

## 2024-04-16 MED ORDER — ONDANSETRON HCL 4 MG PO TABS: 4.0000 mg | ORAL_TABLET | Freq: Three times a day (TID) | ORAL | 0 refills | Status: DC | PRN

## 2024-04-16 NOTE — Progress Notes (Signed)
 WEIGHT SUMMARY AND BIOMETRICS  Weight Lost Since Last Visit: 0  Weight Gained Since Last Visit: 1lb   Vitals BP: 135/73 Pulse Rate: 87 SpO2: 95 %   Anthropometric Measurements Height: 5' 5 (1.651 m) Weight: 206 lb (93.4 kg) BMI (Calculated): 34.28 Weight at Last Visit: 205lb Weight Lost Since Last Visit: 0 Weight Gained Since Last Visit: 1lb Starting Weight: 246lb Total Weight Loss (lbs): 34 lb (15.4 kg)   Body Composition  Body Fat %: 45.5 % Fat Mass (lbs): 94 lbs Muscle Mass (lbs): 106.8 lbs Total Body Water (lbs): 77.2 lbs Visceral Fat Rating : 14   Other Clinical Data Fasting: no Labs: no Today's Visit #: 48 Starting Date: 03/12/19    OBESITY Jessica [name] is here to discuss her progress with her obesity treatment plan along with follow-up of her obesity related diagnoses.    Nutrition Plan: the Category 3 plan - 85% adherence.  Current exercise: walking and cubie  Interim History:  She is up 1 lb since her last visit. She ate more carbohydrates during the holiday.  Eating all of the food on the plan., Protein intake is as prescribed, and Water intake is adequate.   Pharmacotherapy: Jessica [name] is on Trulicity  1.5 mg SQ weekly Adverse side effects: Nausea (mild)  Hunger is moderately controlled.  Cravings are moderately controlled.  Assessment/Plan:   Type II Diabetes HgbA1c is at goal. Last A1c was 6.1 CBGs: Not checking      Episodes of hypoglycemia: no Medication(s): Trulicity  1.5 mg SQ weekly Her nausea with Trulicity  is now minimal.   Lab Results  Component Value Date   HGBA1C 6.1 (A) 01/24/2024   HGBA1C 6.2 (A) 10/24/2023   HGBA1C 6.2 (A) 07/17/2023   Lab Results  Component Value Date   MICROALBUR <0.2 01/24/2024   LDLCALC 69 10/24/2023   CREATININE 1.15 10/24/2023   Lab Results  Component Value Date   GFR 47.41 (L)  10/24/2023   GFR 59.84 (L) 04/17/2023   GFR 57.36 (L) 04/12/2022    Plan: Continue and refill Trulicity  1.5 mg SQ weekly Continue all other medications.  Will keep all carbohydrates low both sweets and starches.  Will continue exercise regimen to 30 to 60 minutes on most days of the week.  Will eat out less.  Will cook more meals at home.    Hypertension Hypertension stable.  Medication(s): Amlodipine  5 mg 1 daily   BP Readings from Last 3 Encounters:  04/16/24 135/73  03/20/24 (!) 148/76  02/20/24 (!) 152/63   Lab Results  Component Value Date   CREATININE 1.15 10/24/2023   CREATININE 0.83 06/05/2023   CREATININE 0.95 04/17/2023   Lab Results  Component Value Date   GFR 47.41 (L) 10/24/2023   GFR 59.84 (L) 04/17/2023   GFR 57.36 (L) 04/12/2022    Plan: Continue all antihypertensives at current dosages. No added salt.  Will keep sodium content to 1,500 mg or less per day.     Generalized Obesity: Current BMI BMI (Calculated): 34.28   Pharmacotherapy Plan Continue and refill  Trulicity  1.5 mg SQ weekly  Jessica [name] is currently in the action stage of change. As such, her goal is to continue with weight loss efforts.  She has agreed to the Category 3 plan.  Exercise goals: Older adults should determine their level of effort for physical activity relative to their level of fitness.   Behavioral modification strategies: increasing lean protein intake, decreasing simple carbohydrates , no meal skipping, meal planning , increase water intake, better snacking choices, planning for success, increasing fiber rich foods, avoiding temptations, weigh protein portions, measure portion sizes, and mindful eating.  Jessica [name] has agreed to follow-up with our clinic in 4 weeks.    Objective:   VITALS: Per patient if applicable, see vitals. GENERAL: Alert and in no acute distress. CARDIOPULMONARY: No increased WOB. Speaking in clear sentences.  PSYCH: Pleasant and cooperative. Speech  normal rate and rhythm. Affect is appropriate. Insight and judgement are appropriate. Attention is focused, linear, and appropriate.  NEURO: Oriented as arrived to appointment on time with no prompting.   Attestation Statements:   This was prepared with the assistance of Engineer, Civil (consulting).  Occasional wrong-word or sound-a-like substitutions may have occurred due to the inherent limitations of voice recognition.   Clayborne Daring, DO

## 2024-04-18 ENCOUNTER — Ambulatory Visit (INDEPENDENT_AMBULATORY_CARE_PROVIDER_SITE_OTHER): Admitting: Family Medicine

## 2024-04-18 ENCOUNTER — Encounter: Payer: Self-pay | Admitting: Family Medicine

## 2024-04-18 VITALS — BP 118/68 | HR 70 | Temp 97.3°F | Ht 65.0 in | Wt 210.8 lb

## 2024-04-18 DIAGNOSIS — Z794 Long term (current) use of insulin: Secondary | ICD-10-CM

## 2024-04-18 DIAGNOSIS — Z0001 Encounter for general adult medical examination with abnormal findings: Secondary | ICD-10-CM | POA: Diagnosis not present

## 2024-04-18 DIAGNOSIS — E1143 Type 2 diabetes mellitus with diabetic autonomic (poly)neuropathy: Secondary | ICD-10-CM | POA: Diagnosis not present

## 2024-04-18 DIAGNOSIS — E1169 Type 2 diabetes mellitus with other specified complication: Secondary | ICD-10-CM

## 2024-04-18 DIAGNOSIS — E1159 Type 2 diabetes mellitus with other circulatory complications: Secondary | ICD-10-CM | POA: Diagnosis not present

## 2024-04-18 DIAGNOSIS — I152 Hypertension secondary to endocrine disorders: Secondary | ICD-10-CM | POA: Diagnosis not present

## 2024-04-18 DIAGNOSIS — K3184 Gastroparesis: Secondary | ICD-10-CM | POA: Diagnosis not present

## 2024-04-18 DIAGNOSIS — E118 Type 2 diabetes mellitus with unspecified complications: Secondary | ICD-10-CM | POA: Diagnosis not present

## 2024-04-18 DIAGNOSIS — Z1283 Encounter for screening for malignant neoplasm of skin: Secondary | ICD-10-CM

## 2024-04-18 DIAGNOSIS — E782 Mixed hyperlipidemia: Secondary | ICD-10-CM

## 2024-04-18 DIAGNOSIS — J4520 Mild intermittent asthma, uncomplicated: Secondary | ICD-10-CM

## 2024-04-18 LAB — COMPREHENSIVE METABOLIC PANEL WITH GFR
ALT: 15 U/L (ref 3–35)
AST: 21 U/L (ref 5–37)
Albumin: 4.3 g/dL (ref 3.5–5.2)
Alkaline Phosphatase: 91 U/L (ref 39–117)
BUN: 23 mg/dL (ref 6–23)
CO2: 28 meq/L (ref 19–32)
Calcium: 9.3 mg/dL (ref 8.4–10.5)
Chloride: 102 meq/L (ref 96–112)
Creatinine, Ser: 1.33 mg/dL — ABNORMAL HIGH (ref 0.40–1.20)
GFR: 39.68 mL/min — ABNORMAL LOW
Glucose, Bld: 130 mg/dL — ABNORMAL HIGH (ref 70–99)
Potassium: 4.6 meq/L (ref 3.5–5.1)
Sodium: 137 meq/L (ref 135–145)
Total Bilirubin: 0.8 mg/dL (ref 0.2–1.2)
Total Protein: 7 g/dL (ref 6.0–8.3)

## 2024-04-18 LAB — LIPID PANEL
Cholesterol: 118 mg/dL (ref 28–200)
HDL: 58 mg/dL
LDL Cholesterol: 53 mg/dL (ref 10–99)
NonHDL: 59.91
Total CHOL/HDL Ratio: 2
Triglycerides: 34 mg/dL (ref 10.0–149.0)
VLDL: 6.8 mg/dL (ref 0.0–40.0)

## 2024-04-18 MED ORDER — ALBUTEROL SULFATE HFA 108 (90 BASE) MCG/ACT IN AERS: 2.0000 | INHALATION_SPRAY | RESPIRATORY_TRACT | 2 refills | Status: AC | PRN

## 2024-04-18 NOTE — Progress Notes (Signed)
 " Subjective  Chief Complaint  Patient presents with   Annual Exam    Her for annual exam. Diabetes under control. Highest at 144 in am. Needs a referral to dermatology.   Diabetes    HPI: Jessica [name] is a 73 y.o. female who presents to [facility] [place] today for a Female Wellness Visit. She also has the concerns and/or needs as listed above in the chief complaint. These will be addressed in addition to the Health Maintenance Visit.   Wellness Visit: annual visit with health maintenance review and exam  HM: screens are current. Doing well. Reviewed endocrine and healthy weight and wellness notes. Eye exam current.   Chronic disease f/u and/or acute problem visit: (deemed necessary to be done in addition to the wellness visit): Discussed the use of AI scribe software for clinical note transcription with the patient, who gave verbal consent to proceed.  History of Present Illness Jessica [name] is a 73 year old female who presents for routine follow-up and evaluation of skin lesions.  Cutaneous lesions - Multiple flat moles located under the breast area - OB GYN recommended dermatology evaluation due to quantity of moles for skin cancer screening. No worrisome lesions noted  Diabetes mellitus management per endocrine - Glycemic control is stable - Recent hemoglobin A1c was 6.2% at the end of September - multiple meds including trulicity  and insulin  - neuropathy is stable - no claudication - on statin and ARB - no retinopathy - no foot lesions - h/o gastroparesis, not currently active  HTN: excellent control - Does not regularly monitor blood pressure at home - Feeling well. Taking medications w/o adverse effects. No symptoms of CHF, angina; no palpitations, sob, cp or lower extremity edema. Compliant with meds.   HLD on statin fasting for recheck. Has had excellent control. Tolerates med well  Muscle cramps - Experiences muscle cramps in the feet,  primarily at night or when sitting - Attributes cramps to possibly bumping her feet - No numbness, pain, or sores in the legs or feet - No calf pain with ambulation    Assessment  1. Encounter for well adult exam with abnormal findings   2. Combined hyperlipidemia associated with type 2 diabetes mellitus (HCC)   3. Hypertension associated with diabetes (HCC)   4. Type 2 diabetes mellitus with complication, with long-term current use of insulin  (HCC)   5. Diabetic gastroparesis (HCC)   6. Skin cancer screening      Plan  Female Wellness Visit: Age appropriate Health Maintenance and Prevention measures were discussed with patient. Included topics are cancer screening recommendations, ways to keep healthy (see AVS) including dietary and exercise recommendations, regular eye and dental care, use of seat belts, and avoidance of moderate alcohol use and tobacco use.  BMI: discussed patient's BMI and encouraged positive lifestyle modifications to help get to or maintain a target BMI. HM needs and immunizations were addressed and ordered. See below for orders. See HM and immunization section for updates. Routine labs and screening tests ordered including cmp, cbc and lipids where appropriate. Discussed recommendations regarding Vit D and calcium  supplementation (see AVS)  Chronic disease management visit and/or acute problem visit: Assessment and Plan Assessment & Plan Type 2 diabetes mellitus with complications Diabetes is well-controlled with an A1c of 6.2% as of September. Blood pressure is generally well-controlled at home, with occasional elevated readings in the office. No current complications related to diabetes. - Continue current diabetes management plan. -  monitoring renal function, lytes and A1c  HTN is well controlled. No change in meds.   HLD recheck fasting lipids. If good control, can go to annual screens. Check lftss  Weight is stable  Refer to derm for routine skin  cancer screening annually. No worrisome lesions noted on exam today.   General Health Maintenance Multiple flat moles under the breast, not raised or concerning. Dermatology referral discussed for annual skin check, though not urgent. - Referred to dermatology for annual skin check.    Follow up: 6 mo for recheck  Orders Placed This Encounter  Procedures   Comprehensive metabolic panel with GFR   Lipid panel   Ambulatory referral to Dermatology   POCT glycosylated hemoglobin (Hb A1C)   No orders of the defined types were placed in this encounter.     Body mass index is 35.08 kg/m. Wt Readings from Last 3 Encounters:  04/18/24 210 lb 12.8 oz (95.6 kg)  04/16/24 206 lb (93.4 kg)  04/01/24 205 lb (93 kg)     Patient Active Problem List   Diagnosis Date Noted Date Diagnosed   Morbid obesity (HCC) 04/13/2022     Priority: High    BMI 34    Peripheral sensory neuropathy due to type 2 diabetes mellitus (HCC) 04/05/2020     Priority: High   Celiac disease 02/14/2018     Priority: High   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) 02/14/2018     Priority: High   Class 2 obesity due to excess calories with body mass index (BMI) of 37.0 to 37.9 in adult 02/14/2018     Priority: High   Type 2 diabetes mellitus with complication, with long-term current use of insulin  (HCC) 09/23/2010     Priority: High   Combined hyperlipidemia associated with type 2 diabetes mellitus (HCC) 01/29/2009     Priority: High   Hypertension associated with diabetes (HCC) 10/22/2006     Priority: High    Qualifier: Diagnosis of  By: Wilhemina KRAFT, Lucy      Status post total right knee replacement 09/12/2022     Priority: Medium    Degenerative arthritis of left knee 08/07/2022     Priority: Medium     Toradol  injection given in August 07, 2022    Gastroesophageal reflux disease 04/18/2022     Priority: Medium    Family history of malignant neoplasm of endometrium 10/08/2018      Priority: Medium    Osteopenia 02/14/2018     Priority: Medium     dexa 2019    Mild intermittent asthma 02/14/2018     Priority: Medium    Degenerative cervical disc 07/27/2015     Priority: Medium    OSA (obstructive sleep apnea) 02/28/2011     Priority: Medium    Constipation, slow transit 09/23/2010     Priority: Medium    FH: colon cancer 09/23/2010     Priority: Medium    History of colonic polyps 09/23/2010     Priority: Medium     IMO SNOMED Dx Update Oct 2024    Diabetic gastroparesis (HCC) 02/27/2007     Priority: Medium    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) 10/22/2006     Priority: Medium    Chronic rhinitis 04/18/2022     Priority: Low   Vitamin D  deficiency 04/13/2022     Priority: Low   Bilateral lower extremity edema 01/20/2020     Priority: Low  Fibroma of tongue 06/06/2016     Priority: Low   Renal calculi 01/13/2016     Priority: Low   Vitamin B12 deficiency 07/17/2023    Multinodular goiter (nontoxic) 04/17/2023     Endocrine following    Trigger point of right shoulder region 08/02/2015     Injections given on July 03, 2023 injected 08/02/2015 injections given March 10, 2021    Health Maintenance  Topic Date Due   Mammogram  05/15/2024   COVID-19 Vaccine (4 - 2025-26 season) 05/04/2024 (Originally 12/31/2023)   Influenza Vaccine  07/29/2024 (Originally 11/30/2023)   HEMOGLOBIN A1C  07/23/2024   OPHTHALMOLOGY EXAM  08/22/2024   Colonoscopy  10/13/2024   Diabetic kidney evaluation - eGFR measurement  10/23/2024   Diabetic kidney evaluation - Urine ACR  01/23/2025   FOOT EXAM  01/23/2025   Medicare Annual Wellness (AWV)  04/01/2025   Bone Density Scan  10/25/2025   DTaP/Tdap/Td (3 - Td or Tdap) 10/26/2027   Pneumococcal Vaccine: 50+ Years  Completed   Hepatitis C Screening  Completed   Zoster Vaccines- Shingrix   Completed   Meningococcal B Vaccine  Aged Out   Immunization History  Administered  Date(s) Administered   Fluad Quad(high Dose 65+) 01/16/2019, 01/20/2020, 04/12/2022   Fluad Trivalent(High Dose 65+) 04/17/2023   Influenza,inj,Quad PF,6+ Mos 02/14/2018   Influenza-Unspecified 12/30/2020   PFIZER Comirnaty(Gray Top)Covid-19 Tri-Sucrose Vaccine 09/02/2020   PFIZER(Purple Top)SARS-COV-2 Vaccination 07/03/2019, 08/06/2019   Pneumococcal Conjugate-13 11/03/2015   Pneumococcal Polysaccharide-23 08/17/2010, 02/14/2018   Td 12/31/2002   Tdap 10/25/2017   Zoster Recombinant(Shingrix ) 11/05/2018, 01/28/2019   We updated and reviewed the patient's past history in detail and it is documented below. Allergies: Patient is allergic to codeine , gluten meal, metronidazole, nsaids, soy allergy  (obsolete), and wheat. Past Medical History Patient  has a past medical history of Anxiety, Arthritis, Asthma, Autonomic neuropathy, Cataract, Celiac disease, Constipation, Depression, Diabetes mellitus, Diabetic neuropathy (HCC), Diverticulosis, Fatty liver, Gastroparesis, GERD (gastroesophageal reflux disease), History of claustrophobia, Hyperlipidemia, Hypertension, IBS (irritable bowel syndrome), Kidney stones, Personal history of colonic polyps (03/2010), PONV (postoperative nausea and vomiting), and Shingles (2021). Past Surgical History Patient  has a past surgical history that includes Tubal ligation; Appendectomy; left breast cyst removal (2000); Dilatation & currettage/hysteroscopy with resectoscope (N/A, 03/24/2013); Colonoscopy; Hysteroscopy with D & C (N/A, 11/08/2015); Cataract extraction (Right, 04/29/2018); Cataract extraction (Left, 03/2018); LEFT HEART CATH AND CORONARY ANGIOGRAPHY (N/A, 10/31/2018); and Total knee arthroplasty (Right, 09/12/2022). Family History: Patient family history includes COPD in her father; Colon cancer in her sister; Diabetes in her father and sister; Endometrial cancer in her sister; Heart attack in her father; Heart failure in her father; High blood pressure in  her father; Hypertension in her brother and mother; Other in her father. Social History:  Patient  reports that she has never smoked. She has never used smokeless tobacco. She reports that she does not drink alcohol and does not use drugs.  Review of Systems: Constitutional: negative for fever or malaise Ophthalmic: negative for photophobia, double vision or loss of vision Cardiovascular: negative for chest pain, dyspnea on exertion, or new LE swelling Respiratory: negative for SOB or persistent cough Gastrointestinal: negative for abdominal pain, change in bowel habits or melena Genitourinary: negative for dysuria or gross hematuria, no abnormal uterine bleeding or disharge Musculoskeletal: negative for new gait disturbance or muscular weakness Integumentary: negative for new or persistent rashes, no breast lumps Neurological: negative for TIA or stroke symptoms Psychiatric: negative for SI or delusions Allergic/Immunologic: negative  for hives  Patient Care Team    Relationship Specialty Notifications Start End  Jodie Lavern CROME, MD PCP - General Family Medicine  02/14/18   Dann Candyce RAMAN, MD PCP - Cardiology Cardiology Admissions 11/12/18   Lenon Oneil BRAVO, MD Attending Physician Obstetrics and Gynecology  04/04/12   Albertus Gordy HERO, MD Consulting Physician Gastroenterology  01/16/19   Vernona Slough, OD Consulting Physician Ophthalmology  01/16/19   Verta Royden DASEN, DPM Consulting Physician Podiatry  01/16/19   Nicholaus Sherlean CROME, [facility] (Inactive) Pharmacist Pharmacist  03/04/21    Comment: [phone]  Trixie File, MD Consulting Physician Endocrinology  04/18/24     Objective  Vitals: BP 118/68 (BP Location: Left Arm, Patient Position: Sitting, Cuff Size: Normal)   Pulse 70   Temp (!) 97.3 F (36.3 C) (Temporal)   Ht 5' 5 (1.651 m)   Wt 210 lb 12.8 oz (95.6 kg)   SpO2 97%   BMI 35.08 kg/m  General:  Well developed, well nourished, no acute distress  Psych:  Alert and  orientedx3,normal mood and affect HEENT:  Normocephalic, atraumatic, non-icteric sclera,  supple neck without adenopathy, mass or thyromegaly Cardiovascular:  Normal S1, S2, RRR without gallop, rub or murmur Respiratory:  Good breath sounds bilaterally, CTAB with normal respiratory effort Gastrointestinal: normal bowel sounds, soft, non-tender, no noted masses. No HSM MSK: extremities without edema, joints without erythema or swelling Neurologic:    Mental status is normal.  Gross motor and sensory exams are normal.  No tremor Skin: seb k's and moles, benign appearing Foot exam benign  Commons side effects, risks, benefits, and alternatives for medications and treatment plan prescribed today were discussed, and the patient expressed understanding of the given instructions. Patient is instructed to call or message via MyChart if he/she has any questions or concerns regarding our treatment plan. No barriers to understanding were identified. We discussed Red Flag symptoms and signs in detail. Patient expressed understanding regarding what to do in case of urgent or emergency type symptoms.  Medication list was reconciled, printed and provided to the patient in AVS. Patient instructions and summary information was reviewed with the patient as documented in the AVS. This note was prepared with assistance of Dragon voice recognition software. Occasional wrong-word or sound-a-like substitutions may have occurred due to the inherent limitations of voice recognition software     "

## 2024-04-18 NOTE — Patient Instructions (Signed)
 Please return in 6 months to recheck diabetes and blood pressure   I will release your lab results to you on your MyChart account with further instructions. You may see the results before I do, but when I review them I will send you a message with my report or have my assistant call you if things need to be discussed. Please reply to my message with any questions. Thank you!   If you have any questions or concerns, please don't hesitate to send me a message via MyChart or call the office at [phone]. Thank you for visiting with us  today! It's our pleasure caring for you.   We will call you with information regarding your referral appointment. Dermatology for a skin cancer screening exam.  If you do not hear from us  within the next 2 weeks, please let me know. It can take 1-2 weeks to get appointments set up with the specialists.

## 2024-04-25 ENCOUNTER — Ambulatory Visit: Payer: Self-pay | Admitting: Family Medicine

## 2024-04-25 NOTE — Progress Notes (Signed)
 See mychart note Dear Jessica [name], Your lab results show worsening of your kidney function. Please stay well hydrated and I recommend rechecking in 3 months. If persists, I would consider adding a medication to help protect the kidneys. We can discuss with your endocrinologist as well since it is a medication for diabetes called an SGLT2-inhibitor.  Please have your kidney function rechecked in 3 months, with Dr. Trixie, Dr. Delores or here with me. Your other lab results all look fine.  Sincerely, Dr. Jodie

## 2024-05-01 ENCOUNTER — Encounter: Payer: Self-pay | Admitting: Family Medicine

## 2024-05-06 NOTE — Progress Notes (Signed)
 " [name] [address] [place] [address] Phone: [phone] Subjective:   Jessica [name] Jessica [name] am a scribe for Dr. Claudene.   I'm seeing this patient by the request  of:  Jessica Lavern CROME, MD  CC: Back pain after tripping  YEP:Dlagzrupcz  07/03/2023 Has been 2-1/2-year since previous injections that has improved previously.  Does have some radicular symptoms also noted from patient's degenerative disc disease of the neck that we need to continue to monitor.  Discussed icing regimen and home exercises, discussed which activities to do and which ones to avoid.  Increase activity slowly.  Keep working on air cabin crew.  Follow-up again in 6 to 8 weeks     Updated 05/09/2024 Jessica [name] is a 74 y.o. female coming in with complaint of back pain. Tripped  2 weeks ago but did not fall. Tripped over computer thing she has there. Been icing it and using heat. She heard something when she did it and it is in the low back and radiates down her butt to her leg. Celebrex  is the only the medication she can take. Left hip is also hurting.        Past Medical History:  Diagnosis Date   Anxiety    Arthritis    bilateral knees   Asthma    childhood, inhaler for wheezing PRN   Autonomic neuropathy    diabetic   Cataract    Celiac disease    Constipation    Depression    Diabetes mellitus    type II, Hemoglobin A1C 9.9 10/05/2011   Diabetic neuropathy (HCC)    Diverticulosis    Fatty liver    Gastroparesis    GERD (gastroesophageal reflux disease)    History of claustrophobia    with MRI tests   Hyperlipidemia    Hypertension    IBS (irritable bowel syndrome)    Kidney stones    Morbid obesity (HCC) 04/13/2022   BMI 34     Personal history of colonic polyps 03/2010   hyperplastic   PONV (postoperative nausea and vomiting)    Shingles 2021   Past Surgical History:  Procedure Laterality Date   APPENDECTOMY     CATARACT EXTRACTION Right  04/29/2018   CATARACT EXTRACTION Left 03/2018   COLONOSCOPY     DILATATION & CURRETTAGE/HYSTEROSCOPY WITH RESECTOCOPE N/A 03/24/2013   Procedure: DILATATION & CURETTAGE, HYSTEROSCOPY WITH RESECTION;  Surgeon: Oneil FORBES Piety, MD;  Location: [facility];  Service: Gynecology;  Laterality: N/A;   HYSTEROSCOPY WITH D & C N/A 11/08/2015   Procedure: DILATATION AND CURETTAGE /HYSTEROSCOPY;  Surgeon: Oneil FORBES Piety, MD;  Location: [facility];  Service: Gynecology;  Laterality: N/A;   left breast cyst removal  2000   LEFT HEART CATH AND CORONARY ANGIOGRAPHY N/A 10/31/2018   Procedure: LEFT HEART CATH AND CORONARY ANGIOGRAPHY;  Surgeon: Dann Candyce RAMAN, MD;  Location: [facility];  Service: Cardiovascular;  Laterality: N/A;   TOTAL KNEE ARTHROPLASTY Right 09/12/2022   Procedure: RIGHT TOTAL KNEE ARTHROPLASTY;  Surgeon: Vernetta Lonni GRADE, MD;  Location: [facility];  Service: Orthopedics;  Laterality: Right;   TUBAL LIGATION     Social History   Socioeconomic History   Marital status: Married    Spouse name: [name]   Number of children: 2   Years of education: Not on file   Highest education level: Not on file  Occupational History   Occupation: conservation officer, [employer] at [employer]  Employer: RETIRED    Comment: parttime   Occupation: retired  Tobacco Use   Smoking status: Never   Smokeless tobacco: Never  Vaping Use   Vaping status: Never Used  Substance and Sexual Activity   Alcohol use: No   Drug use: No   Sexual activity: Yes    Birth control/protection: Post-menopausal, Surgical  Other Topics Concern   Not on file  Social History Narrative   Not on file   Social Drivers of Health   Tobacco Use: Low Risk (04/18/2024)   Patient History    Smoking Tobacco Use: Never    Smokeless Tobacco Use: Never    Passive Exposure: Not on file  Financial Resource Strain: Low Risk (03/22/2023)   Overall Financial Resource Strain (CARDIA)    Difficulty of Paying Living Expenses: Not  hard at all  Food Insecurity: No Food Insecurity (04/01/2024)   Epic    Worried About Radiation Protection Practitioner of Food in the Last Year: Never true    Ran Out of Food in the Last Year: Never true  Transportation Needs: No Transportation Needs (04/01/2024)   Epic    Lack of Transportation (Medical): No    Lack of Transportation (Non-Medical): No  Physical Activity: Sufficiently Active (04/01/2024)   Exercise Vital Sign    Days of Exercise per Week: 5 days    Minutes of Exercise per Session: 30 min  Stress: Stress Concern Present (04/01/2024)   [employer] of Occupational Health - Occupational Stress Questionnaire    Feeling of Stress: Rather much  Social Connections: Moderately Integrated (04/01/2024)   Social Connection and Isolation Panel    Frequency of Communication with Friends and Family: More than three times a week    Frequency of Social Gatherings with Friends and Family: More than three times a week    Attends Religious Services: Patient unable to answer    Active Member of Clubs or Organizations: Yes    Attends Banker Meetings: 1 to 4 times per year    Marital Status: Married  Depression (PHQ2-9): Low Risk (04/18/2024)   Depression (PHQ2-9)    PHQ-2 Score: 0  Alcohol Screen: Not on file  Housing: Unknown (04/01/2024)   Epic    Unable to Pay for Housing in the Last Year: No    Number of Times Moved in the Last Year: Not on file    Homeless in the Last Year: No  Utilities: Not At Risk (04/01/2024)   Epic    Threatened with loss of utilities: No  Health Literacy: Adequate Health Literacy (04/01/2024)   B1300 Health Literacy    Frequency of need for help with medical instructions: Never   Allergies[1] Family History  Problem Relation Age of Onset   Hypertension Mother    Colon cancer Sister        dx in her early 97s   Diabetes Sister    Endometrial cancer Sister    Hypertension Brother    Other Father        2 collapsed lungs   COPD Father    Diabetes Father     Heart attack Father    Heart failure Father    High blood pressure Father    Colon polyps Neg Hx    Esophageal cancer Neg Hx    Stomach cancer Neg Hx     Current Outpatient Medications (Endocrine & Metabolic):    Dulaglutide  (TRULICITY ) 1.5 MG/0.5ML SOAJ, Inject 1.5 mg into the skin once a week.   insulin   glargine (LANTUS  SOLOSTAR) 100 UNIT/ML Solostar Pen, Inject 16-18 Units into the skin at bedtime.   insulin  lispro (HUMALOG  KWIKPEN) 100 UNIT/ML KwikPen, Inject 4-9 Units into the skin 3 (three) times daily before meals.  Current Outpatient Medications (Cardiovascular):    amLODipine  (NORVASC ) 5 MG tablet, Take 1 tablet (5 mg total) by mouth daily.   atorvastatin  (LIPITOR) 80 MG tablet, Take 1 tablet (80 mg total) by mouth at bedtime.   furosemide  (LASIX ) 20 MG tablet, TAKE 1 TABLET (20 MG TOTAL) BY MOUTH DAILY AS NEEDED FOR FLUID OR EDEMA.   olmesartan  (BENICAR ) 40 MG tablet, TAKE 1 TABLET BY MOUTH EVERY DAY  Current Outpatient Medications (Respiratory):    albuterol  (VENTOLIN  HFA) 108 (90 Base) MCG/ACT inhaler, Inhale 2 puffs into the lungs every 4 (four) hours as needed for wheezing or shortness of breath.  Current Outpatient Medications (Analgesics):    celecoxib  (CELEBREX ) 200 MG capsule, TAKE 1 CAPSULE BY MOUTH 2 TIMES DAILY BETWEEN MEALS AS NEEDED.  Current Outpatient Medications (Other):    BD PEN NEEDLE NANO 2ND GEN 32G X 4 MM MISC, USE 3-4X A DAY   cimetidine  (TAGAMET ) 800 MG tablet, Take 1 tablet (800 mg total) by mouth at bedtime.   dexlansoprazole  (DEXILANT ) 60 MG capsule, Take 1 capsule (60 mg total) by mouth daily.   gabapentin  (NEURONTIN ) 600 MG tablet, Take one tablet in the morning and two tablets before bedtime   glucose blood (ONETOUCH ULTRA) test strip, USE 2 TIMES DAILY. AND LANCETS 2/DAY   Insulin  Syringe-Needle U-100 (BD INSULIN  SYRINGE U/F) 31G X 5/16 1 ML MISC, USE UP TO 3 TIMES A DAY   ondansetron  (ZOFRAN ) 4 MG tablet, Take 1 tablet (4 mg total) by  mouth every 8 (eight) hours as needed.   polyethylene glycol powder ([employer] PURELAX) 17 GM/SCOOP powder, Take 17 g by mouth at bedtime.   sucralfate  (CARAFATE ) 1 GM/10ML suspension, Take 10 mLs (1 g total) by mouth daily as needed (nausea).   valACYclovir  (VALTREX ) 500 MG tablet, TAKE 1 TABLET (500 MG TOTAL) BY MOUTH DAILY.   Vitamin D , Ergocalciferol , (DRISDOL ) 1.25 MG (50000 UNIT) CAPS capsule, Take 1 capsule (50,000 Units total) by mouth every 7 (seven) days.   Reviewed prior external information including notes and imaging from  primary care provider As well as notes that were available from care everywhere and other healthcare systems.  Past medical history, social, surgical and family history all reviewed in electronic medical record.  No pertanent information unless stated regarding to the chief complaint.   Review of Systems:  No headache, visual changes, nausea, vomiting, diarrhea, constipation, dizziness, abdominal pain, skin rash, fevers, chills, night sweats, weight loss, swollen lymph nodes, body aches, joint swelling, chest pain, shortness of breath, mood changes. POSITIVE muscle aches  Objective  Blood pressure 112/60, pulse 76, height 5' 5 (1.651 m), weight 212 lb (96.2 kg), SpO2 95%.   General: No apparent distress alert and oriented x3 mood and affect normal, dressed appropriately.  HEENT: Pupils equal, extraocular movements intact  Respiratory: Patient's speak in full sentences and does not appear short of breath  Cardiovascular: No lower extremity edema, non tender, no erythema  Back exam shows mild loss of lordosis.  More tenderness to palpation in the paraspinal musculature.  Patient does have pain with multiple trigger points that seems to be in the latissimus dorsi, quadratus lumborum and Spinella's muscle.  Some may be even in the proximal gluteal area.  Negative straight leg test.   After  verbal consent patient was prepped with alcohol swab and with a 25-gauge 1 inch  needle patient was injected in 4 distinct trigger points in the latissimus dorsi, quadratus lumborum and gluteus medius muscle.  Total of 3 cc of 0.5% Marcaine  and 1 cc of Kenalog  40 mg/mL used.  No blood loss.  Band-Aid placed.  Postinjection instructions given.   Impression and Recommendations:     The above documentation has been reviewed and is accurate and complete Arthea CHRISTELLA Sharps, DO       [1]  Allergies Allergen Reactions   Codeine  Nausea And Vomiting   Gluten Meal Other (See Comments)    No wheat or soy   Metronidazole Nausea And Vomiting   Nsaids Nausea And Vomiting and Other (See Comments)    Cannot tolerate large doses d/t Celiac disease    Soy Allergy  (Obsolete)    Wheat    "

## 2024-05-09 ENCOUNTER — Ambulatory Visit

## 2024-05-09 ENCOUNTER — Encounter: Payer: Self-pay | Admitting: Family Medicine

## 2024-05-09 ENCOUNTER — Ambulatory Visit: Admitting: Family Medicine

## 2024-05-09 VITALS — BP 112/60 | HR 76 | Ht 65.0 in | Wt 212.0 lb

## 2024-05-09 DIAGNOSIS — M5459 Other low back pain: Secondary | ICD-10-CM | POA: Insufficient documentation

## 2024-05-09 DIAGNOSIS — M545 Low back pain, unspecified: Secondary | ICD-10-CM

## 2024-05-09 DIAGNOSIS — M25552 Pain in left hip: Secondary | ICD-10-CM | POA: Diagnosis not present

## 2024-05-09 NOTE — Patient Instructions (Addendum)
 Good to see you. X-rays on the way out. Trigger point injections today.   Exercises. See me again in 2 months if needed.

## 2024-05-09 NOTE — Assessment & Plan Note (Signed)
 Attempted some injections and for some trigger points.  Could have been more of a muscular injury.  Discussed icing regimen.  Discussed home exercises, discussed which activities to do and which ones to avoid.  Patient does have known degenerative disc and has had MRI in 2023 showing nerve impingement.  Could be a potential exacerbation as well.  Increase activity slowly.  If worsening pain either advanced imaging or consider epidurals.  Follow-up again 6 to 12 weeks.  Unable to do anti-inflammatories secondary to chronic kidney disease.  Patient is also the primary caregiver for her ailing husband at the moment.

## 2024-05-15 ENCOUNTER — Ambulatory Visit: Admitting: Bariatrics

## 2024-05-15 ENCOUNTER — Encounter: Payer: Self-pay | Admitting: Bariatrics

## 2024-05-15 VITALS — BP 145/74 | HR 80 | Ht 65.0 in | Wt 204.0 lb

## 2024-05-15 DIAGNOSIS — E119 Type 2 diabetes mellitus without complications: Secondary | ICD-10-CM | POA: Diagnosis not present

## 2024-05-15 DIAGNOSIS — E669 Obesity, unspecified: Secondary | ICD-10-CM | POA: Diagnosis not present

## 2024-05-15 DIAGNOSIS — Z6833 Body mass index (BMI) 33.0-33.9, adult: Secondary | ICD-10-CM

## 2024-05-15 DIAGNOSIS — E1169 Type 2 diabetes mellitus with other specified complication: Secondary | ICD-10-CM

## 2024-05-15 DIAGNOSIS — E785 Hyperlipidemia, unspecified: Secondary | ICD-10-CM

## 2024-05-15 DIAGNOSIS — E1142 Type 2 diabetes mellitus with diabetic polyneuropathy: Secondary | ICD-10-CM

## 2024-05-15 DIAGNOSIS — E6609 Other obesity due to excess calories: Secondary | ICD-10-CM

## 2024-05-15 DIAGNOSIS — Z7985 Long-term (current) use of injectable non-insulin antidiabetic drugs: Secondary | ICD-10-CM | POA: Diagnosis not present

## 2024-05-15 MED ORDER — ONDANSETRON HCL 4 MG PO TABS: 4.0000 mg | ORAL_TABLET | Freq: Three times a day (TID) | ORAL | 0 refills | Status: AC | PRN

## 2024-05-15 MED ORDER — TRULICITY 1.5 MG/0.5ML ~~LOC~~ SOAJ: 1.5000 mg | SUBCUTANEOUS | 0 refills | Status: AC

## 2024-05-15 NOTE — Progress Notes (Signed)
 "                                                                                                             WEIGHT SUMMARY AND BIOMETRICS  Weight Lost Since Last Visit: 2lb  Weight Gained Since Last Visit: 0   Vitals BP: (!) 145/74 Pulse Rate: 80 SpO2: 97 %   Anthropometric Measurements Height: 5' 5 (1.651 m) Weight: 204 lb (92.5 kg) BMI (Calculated): 33.95 Weight at Last Visit: 206lb Weight Lost Since Last Visit: 2lb Weight Gained Since Last Visit: 0 Starting Weight: 246lb Total Weight Loss (lbs): 36 lb (16.3 kg)   Body Composition  Body Fat %: 45.2 % Fat Mass (lbs): 92.4 lbs Muscle Mass (lbs): 106.4 lbs Total Body Water (lbs): 75 lbs Visceral Fat Rating : 14   Other Clinical Data Fasting: no Labs: no Today's Visit #: 28 Starting Date: 03/12/19    OBESITY Jessica [name] is here to discuss her progress with her obesity treatment plan along with follow-up of her obesity related diagnoses.    Nutrition Plan: the Category 3 plan - 90% adherence.  Current exercise: walking and cubie  Interim History:  She is down 2 lbs since her last visit. She has been under a lot of stress.  Eating all of the food on the plan., Protein intake is as prescribed, Is not skipping meals, and Water intake is adequate.   Pharmacotherapy: Jessica [name] is on Trulicity  1.5 mg SQ weekly Adverse side effects: None Hunger is moderately controlled.  Cravings are moderately controlled.  Assessment/Plan:   Type II Diabetes HgbA1c is at goal. Last A1c was 6.1 CBGs: Not checking      Episodes of hypoglycemia: no Medication(s): Trulicity  1.5 mg SQ weekly  Lab Results  Component Value Date   HGBA1C 6.1 (A) 01/24/2024   HGBA1C 6.2 (A) 10/24/2023   HGBA1C 6.2 (A) 07/17/2023   Lab Results  Component Value Date   MICROALBUR <0.2 01/24/2024   LDLCALC 53 04/18/2024   CREATININE 1.33 (H) 04/18/2024   Lab Results  Component Value Date   GFR 39.68 (L) 04/18/2024   GFR 47.41 (L) 10/24/2023    GFR 59.84 (L) 04/17/2023    Plan: Continue and refill Trulicity  1.5 mg SQ weekly Continue all other medications.  Will keep all carbohydrates low both sweets and starches.  Will continue exercise regimen to 30 to 60 minutes on most days of the week.  Aim for 7 to 9 hours of sleep nightly.  Eat more low glycemic index foods. Will continue to do meal planning and cook at home. She will increase her water intake to 64 ounces per day. She will try not to skip meals and if she does she will have a protein shake and may be an egg or a cheese stick.  She will avoid greasy foods and will not go too long without eating a meal.    Hyperlipidemia LDL is at goal. Medication(s): Lipitor Cardiovascular risk factors: diabetes mellitus, dyslipidemia, and obesity (BMI >= 30 kg/m2)  Lab Results  Component Value  Date   CHOL 118 04/18/2024   HDL 58.00 04/18/2024   LDLCALC 53 04/18/2024   LDLDIRECT 143.2 11/23/2006   TRIG 34.0 04/18/2024   CHOLHDL 2 04/18/2024   Lab Results  Component Value Date   ALT 15 04/18/2024   AST 21 04/18/2024   ALKPHOS 91 04/18/2024   BILITOT 0.8 04/18/2024   The ASCVD Risk score (Arnett DK, et al., 2019) failed to calculate for the following reasons:   The valid total cholesterol range is 130 to 320 mg/dL  Plan:  Continue statin.  Will avoid all trans fats.  Will read labels Will minimize saturated fats except the following: low fat meats in moderation, diary, and limited dark chocolate.  Will eat healthy fats such as monounsaturated and polyunsaturated and appropriate amounts.     Generalized Obesity: Current BMI BMI (Calculated): 33.95   Pharmacotherapy Plan Continue and refill  Trulicity  1.5 mg SQ weekly  Jessica [name] is currently in the action stage of change. As such, her goal is to continue with weight loss efforts.  She has agreed to the Category 3 plan.  Exercise goals: Older adults should determine their level of effort for physical activity relative  to their level of fitness.   Behavioral modification strategies: increasing lean protein intake, decreasing simple carbohydrates , meal planning , increase water intake, better snacking choices, planning for success, increasing vegetables, weigh protein portions, measure portion sizes, and mindful eating.  Jessica [name] has agreed to follow-up with our clinic in 4 weeks.     Objective:   VITALS: Per patient if applicable, see vitals. GENERAL: Alert and in no acute distress. CARDIOPULMONARY: No increased WOB. Speaking in clear sentences.  PSYCH: Pleasant and cooperative. Speech normal rate and rhythm. Affect is appropriate. Insight and judgement are appropriate. Attention is focused, linear, and appropriate.  NEURO: Oriented as arrived to appointment on time with no prompting.   Attestation Statements:   This was prepared with the assistance of Engineer, Civil (consulting).  Occasional wrong-word or sound-a-like substitutions may have occurred due to the inherent limitations of voice recognition.   Clayborne Daring, DO    "

## 2024-05-16 ENCOUNTER — Encounter: Payer: Self-pay | Admitting: Family Medicine

## 2024-05-19 ENCOUNTER — Ambulatory Visit: Payer: Self-pay | Admitting: Family Medicine

## 2024-05-20 MED ORDER — PREDNISONE 20 MG PO TABS: 20.0000 mg | ORAL_TABLET | Freq: Two times a day (BID) | ORAL | 0 refills | Status: AC

## 2024-05-29 ENCOUNTER — Encounter: Payer: Self-pay | Admitting: Family Medicine

## 2024-05-30 ENCOUNTER — Other Ambulatory Visit: Payer: Self-pay | Admitting: Orthopaedic Surgery

## 2024-05-30 ENCOUNTER — Ambulatory Visit: Admitting: Family Medicine

## 2024-05-30 ENCOUNTER — Encounter: Payer: Self-pay | Admitting: Family Medicine

## 2024-05-30 VITALS — BP 128/75 | HR 82 | Temp 97.7°F | Resp 98 | Ht 65.0 in | Wt 205.6 lb

## 2024-05-30 DIAGNOSIS — E118 Type 2 diabetes mellitus with unspecified complications: Secondary | ICD-10-CM

## 2024-05-30 DIAGNOSIS — J029 Acute pharyngitis, unspecified: Secondary | ICD-10-CM

## 2024-05-30 DIAGNOSIS — R0989 Other specified symptoms and signs involving the circulatory and respiratory systems: Secondary | ICD-10-CM

## 2024-05-30 DIAGNOSIS — Z794 Long term (current) use of insulin: Secondary | ICD-10-CM

## 2024-05-30 LAB — POCT INFLUENZA A/B
Influenza A, POC: NEGATIVE
Influenza B, POC: NEGATIVE

## 2024-05-30 LAB — POCT RAPID STREP A (OFFICE): Rapid Strep A Screen: NEGATIVE

## 2024-05-30 NOTE — Progress Notes (Signed)
 "  Subjective  CC:  Chief Complaint  Patient presents with   Sore Throat    Pt stated that she has been dealing with a sore thraot for the past 4 days along with come chest congestion    HPI: Jessica [name] is a 74 y.o. female who presents to the office today to address the problems listed above in the chief complaint. C/o sore throat, mild URI sxs without fever. No SOB or GI sxs. No known exposure ot strep or mono. OTC analgesics have been used with minimal or mild relief. Eating and drinking OK.  Sore throat is the worst symptoms, 4 days ago and will be better.  Some bilateral ear pain as well.  No myalgias but does not feel well.  Also just completed a prednisone  taper and sugars have been very elevated.  This could be contributing to her malaise.  She is monitoring her sugars closely and using insulin  to bring them down.  She did have a very elevated reading at 500 several days ago.  It has since improved. No mental status changes.  No pleuritic chest pain.  No sinus pain. Type 2 diabetes with peripheral neuropathy: With hyperglycemia as noted above.  Secondary to prednisone  use and infection.  Education given.  No change in peripheral neuropathy symptoms. I reviewed the patients updated PMH, FH, and SocHx.    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HCC) 04/05/2020    Priority: High   Celiac disease 02/14/2018    Priority: High   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) 02/14/2018    Priority: High   Class 2 obesity due to excess calories with body mass index (BMI) of 37.0 to 37.9 in adult 02/14/2018    Priority: High   Type 2 diabetes mellitus with complication, with long-term current use of insulin  (HCC) 09/23/2010    Priority: High   Combined hyperlipidemia associated with type 2 diabetes mellitus (HCC) 01/29/2009    Priority: High   Hypertension associated with diabetes (HCC)  10/22/2006    Priority: High   Status post total right knee replacement 09/12/2022    Priority: Medium    Degenerative arthritis of left knee 08/07/2022    Priority: Medium    Gastroesophageal reflux disease 04/18/2022    Priority: Medium    Family history of malignant neoplasm of endometrium 10/08/2018    Priority: Medium    Osteopenia 02/14/2018    Priority: Medium    Mild intermittent asthma 02/14/2018    Priority: Medium    Degenerative cervical disc 07/27/2015    Priority: Medium    OSA (obstructive sleep apnea) 02/28/2011    Priority: Medium    Constipation, slow transit 09/23/2010    Priority: Medium    FH: colon cancer 09/23/2010    Priority: Medium    History of colonic polyps 09/23/2010    Priority: Medium    Diabetic gastroparesis (HCC) 02/27/2007    Priority: Medium    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) 10/22/2006    Priority: Medium    Chronic rhinitis 04/18/2022    Priority: Low   Vitamin D  deficiency 04/13/2022    Priority: Low   Bilateral lower extremity edema 01/20/2020    Priority: Low   Fibroma of tongue 06/06/2016    Priority: Low   Renal calculi 01/13/2016    Priority: Low   Lumbar trigger point  syndrome 05/09/2024   Vitamin B12 deficiency 07/17/2023   Multinodular goiter (nontoxic) 04/17/2023   Trigger point of right shoulder region 08/02/2015   Active Medications[1]  Allergies: Patient is allergic to codeine , gluten meal, metronidazole, nsaids, soy allergy  (obsolete), and wheat.  Review of Systems: Constitutional: Negative for fever malaise or anorexia Cardiovascular: negative for chest pain Respiratory: negative for SOB or persistent cough Gastrointestinal: negative for abdominal pain  Objective  Vitals: BP 128/75   Pulse 82   Temp 97.7 F (36.5 C)   Resp (!) 98   Ht 5' 5 (1.651 m)   Wt 205 lb 9.6 oz (93.3 kg)   BMI 34.21 kg/m  General: no acute distress , A&Ox3 HEENT: PEERL,  conjunctiva normal, bilateral EAC and TMs are normal. Nares normal. Oropharynx moist with erythematous posterior pharynx without exudate, + cervical LAD, 2+ tonsils, midline uvula, neck is supple Cardiovascular:  RRR without murmur or gallop.  Respiratory:  Good breath sounds bilaterally, CTAB with normal respiratory effort Skin:  Warm, no rashes  Office Visit on 05/30/2024  Component Date Value Ref Range Status   Rapid Strep A Screen 05/30/2024 Negative  Negative Final   Influenza A, POC 05/30/2024 Negative  Negative Final   Influenza B, POC 05/30/2024 Negative  Negative Final    Assessment  1. Sore throat   2. Chest congestion      Plan  Supportive care with advil , tylenol , gargles etc discussed.  Monitor sugars, hydrate well with water.  Avoid sugars.. RTO if sxs persist or worsen.  Type 2 diabetes with peripheral neuropathy: Hyperglycemic due to prednisone .  Patient is watching her sugars closely.  Discussed management with insulin .  Neuropathy symptoms are stable.  Follow up: as needed  Commons side effects, risks, benefits, and alternatives for medications and treatment plan prescribed today were discussed, and the patient expressed understanding of the given instructions. Patient is instructed to call or message via MyChart if he/she has any questions or concerns regarding our treatment plan. No barriers to understanding were identified. We discussed Red Flag symptoms and signs in detail. Patient expressed understanding regarding what to do in case of urgent or emergency type symptoms.  Medication list was reconciled, printed and provided to the patient in AVS. Patient instructions and summary information was reviewed with the patient as documented in the AVS. This note was prepared with assistance of Dragon voice recognition software. Occasional wrong-word or sound-a-like substitutions may have occurred due to the inherent limitations of voice recognition software  Orders Placed  This Encounter  Procedures   POCT rapid strep A   POCT Influenza A/B   No orders of the defined types were placed in this encounter.           [1]  Current Meds  Medication Sig   albuterol  (VENTOLIN  HFA) 108 (90 Base) MCG/ACT inhaler Inhale 2 puffs into the lungs every 4 (four) hours as needed for wheezing or shortness of breath.   amLODipine  (NORVASC ) 5 MG tablet Take 1 tablet (5 mg total) by mouth daily.   atorvastatin  (LIPITOR) 80 MG tablet Take 1 tablet (80 mg total) by mouth at bedtime.   BD PEN NEEDLE NANO 2ND GEN 32G X 4 MM MISC USE 3-4X A DAY   celecoxib  (CELEBREX ) 200 MG capsule TAKE 1 CAPSULE BY MOUTH 2 TIMES DAILY BETWEEN MEALS AS NEEDED.   cimetidine  (TAGAMET ) 800 MG tablet Take 1 tablet (800 mg total) by mouth at bedtime.   dexlansoprazole  (DEXILANT ) 60 MG capsule Take 1  capsule (60 mg total) by mouth daily.   Dulaglutide  (TRULICITY ) 1.5 MG/0.5ML SOAJ Inject 1.5 mg into the skin once a week.   furosemide  (LASIX ) 20 MG tablet TAKE 1 TABLET (20 MG TOTAL) BY MOUTH DAILY AS NEEDED FOR FLUID OR EDEMA.   gabapentin  (NEURONTIN ) 600 MG tablet Take one tablet in the morning and two tablets before bedtime   glucose blood (ONETOUCH ULTRA) test strip USE 2 TIMES DAILY. AND LANCETS 2/DAY   insulin  glargine (LANTUS  SOLOSTAR) 100 UNIT/ML Solostar Pen Inject 16-18 Units into the skin at bedtime.   insulin  lispro (HUMALOG  KWIKPEN) 100 UNIT/ML KwikPen Inject 4-9 Units into the skin 3 (three) times daily before meals.   Insulin  Syringe-Needle U-100 (BD INSULIN  SYRINGE U/F) 31G X 5/16 1 ML MISC USE UP TO 3 TIMES A DAY   olmesartan  (BENICAR ) 40 MG tablet TAKE 1 TABLET BY MOUTH EVERY DAY   ondansetron  (ZOFRAN ) 4 MG tablet Take 1 tablet (4 mg total) by mouth every 8 (eight) hours as needed.   polyethylene glycol powder ([employer] PURELAX) 17 GM/SCOOP powder Take 17 g by mouth at bedtime.   sucralfate  (CARAFATE ) 1 GM/10ML suspension Take 10 mLs (1 g total) by mouth daily as needed (nausea).    valACYclovir  (VALTREX ) 500 MG tablet TAKE 1 TABLET (500 MG TOTAL) BY MOUTH DAILY.   Vitamin D , Ergocalciferol , (DRISDOL ) 1.25 MG (50000 UNIT) CAPS capsule Take 1 capsule (50,000 Units total) by mouth every 7 (seven) days.   "

## 2024-06-03 ENCOUNTER — Other Ambulatory Visit: Payer: Self-pay

## 2024-06-03 DIAGNOSIS — M542 Cervicalgia: Secondary | ICD-10-CM

## 2024-06-04 ENCOUNTER — Ambulatory Visit

## 2024-06-04 DIAGNOSIS — M542 Cervicalgia: Secondary | ICD-10-CM

## 2024-06-04 MED ORDER — KETOROLAC TROMETHAMINE 30 MG/ML IJ SOLN
30.0000 mg | Freq: Once | INTRAMUSCULAR | Status: AC
  Administered 2024-06-04: 30 mg via INTRAMUSCULAR

## 2024-06-04 MED ORDER — METHYLPREDNISOLONE ACETATE 40 MG/ML IJ SUSP
40.0000 mg | Freq: Once | INTRAMUSCULAR | Status: AC
  Administered 2024-06-04: 40 mg via INTRAMUSCULAR

## 2024-06-04 NOTE — Progress Notes (Signed)
 Patient received Toradol  30 mg in left buttocks and Methylprednisolone  40 mg in right buttocks. Patient tolerated well.   Advised patient to keep checking her sugars after given the Methylprednisolone . Patient has had elevated glucose levels above 500 while taking Prednisone  tablets. She will contact the office if she has the same reaction to the injection.

## 2024-06-04 NOTE — Telephone Encounter (Signed)
 Patient called and is going to try to come today to get an xray and she mentioned getting an injection. Patient may also come tomorrow depending on if she can get out with the snow. Please contact patient to confirm the best time for her to come for a shot.

## 2024-06-04 NOTE — Patient Instructions (Signed)
No NSAIDs for 24 hours

## 2024-06-12 ENCOUNTER — Ambulatory Visit: Admitting: Bariatrics

## 2024-07-08 ENCOUNTER — Ambulatory Visit: Admitting: Family Medicine

## 2024-07-23 ENCOUNTER — Ambulatory Visit: Admitting: Internal Medicine

## 2024-10-15 ENCOUNTER — Ambulatory Visit: Admitting: Family Medicine

## 2024-12-23 ENCOUNTER — Ambulatory Visit: Admitting: Physician Assistant

## 2025-04-28 ENCOUNTER — Encounter: Admitting: Family Medicine
